# Patient Record
Sex: Female | Born: 1945
Health system: Southern US, Community
[De-identification: ages and names within clinical notes are randomized; demographics above are authoritative.]

## PROBLEM LIST (undated history)

## (undated) DIAGNOSIS — N184 Chronic kidney disease, stage 4 (severe): Secondary | ICD-10-CM

## (undated) DIAGNOSIS — N183 Chronic kidney disease, stage 3 (moderate): Secondary | ICD-10-CM

## (undated) DIAGNOSIS — Z789 Other specified health status: Secondary | ICD-10-CM

## (undated) DIAGNOSIS — I5022 Chronic systolic (congestive) heart failure: Secondary | ICD-10-CM

## (undated) DIAGNOSIS — I480 Paroxysmal atrial fibrillation: Secondary | ICD-10-CM

## (undated) DIAGNOSIS — R001 Bradycardia, unspecified: Secondary | ICD-10-CM

## (undated) DIAGNOSIS — I472 Ventricular tachycardia: Secondary | ICD-10-CM

## (undated) DIAGNOSIS — I10 Essential (primary) hypertension: Secondary | ICD-10-CM

## (undated) DIAGNOSIS — D649 Anemia, unspecified: Secondary | ICD-10-CM

## (undated) DIAGNOSIS — Z87891 Personal history of nicotine dependence: Secondary | ICD-10-CM

## (undated) DIAGNOSIS — I428 Other cardiomyopathies: Secondary | ICD-10-CM

## (undated) DIAGNOSIS — E785 Hyperlipidemia, unspecified: Secondary | ICD-10-CM

## (undated) DIAGNOSIS — E46 Unspecified protein-calorie malnutrition: Secondary | ICD-10-CM

## (undated) DIAGNOSIS — I340 Nonrheumatic mitral (valve) insufficiency: Secondary | ICD-10-CM

## (undated) HISTORY — DX: Ventricular tachycardia: I47.2

## (undated) HISTORY — DX: Chronic kidney disease, stage 3 (moderate): N18.3

## (undated) HISTORY — DX: Bradycardia, unspecified: R00.1

---

## 1998-10-01 ENCOUNTER — Other Ambulatory Visit: Admission: RE | Admit: 1998-10-01 | Discharge: 1998-10-01 | Payer: Self-pay | Admitting: Obstetrics and Gynecology

## 1998-11-04 ENCOUNTER — Other Ambulatory Visit: Admission: RE | Admit: 1998-11-04 | Discharge: 1998-11-04 | Payer: Self-pay | Admitting: Obstetrics and Gynecology

## 1999-07-08 ENCOUNTER — Other Ambulatory Visit: Admission: RE | Admit: 1999-07-08 | Discharge: 1999-07-08 | Payer: Self-pay | Admitting: Obstetrics and Gynecology

## 1999-11-09 ENCOUNTER — Other Ambulatory Visit: Admission: RE | Admit: 1999-11-09 | Discharge: 1999-11-09 | Payer: Self-pay | Admitting: Obstetrics and Gynecology

## 2000-03-10 ENCOUNTER — Other Ambulatory Visit: Admission: RE | Admit: 2000-03-10 | Discharge: 2000-03-10 | Payer: Self-pay | Admitting: Obstetrics and Gynecology

## 2001-11-16 ENCOUNTER — Inpatient Hospital Stay (HOSPITAL_COMMUNITY): Admission: EM | Admit: 2001-11-16 | Discharge: 2001-11-19 | Payer: Self-pay | Admitting: Emergency Medicine

## 2001-11-16 ENCOUNTER — Encounter: Payer: Self-pay | Admitting: Emergency Medicine

## 2001-11-17 ENCOUNTER — Encounter: Payer: Self-pay | Admitting: Internal Medicine

## 2001-11-18 ENCOUNTER — Encounter: Payer: Self-pay | Admitting: Internal Medicine

## 2008-09-30 ENCOUNTER — Emergency Department (HOSPITAL_COMMUNITY): Admission: EM | Admit: 2008-09-30 | Discharge: 2008-09-30 | Payer: Self-pay | Admitting: Emergency Medicine

## 2009-12-24 ENCOUNTER — Ambulatory Visit (HOSPITAL_COMMUNITY): Admission: RE | Admit: 2009-12-24 | Discharge: 2009-12-24 | Payer: Self-pay | Admitting: Gastroenterology

## 2010-11-12 NOTE — Discharge Summary (Signed)
[facility]  Patient:    Stephanie Yates, Stephanie Yates Visit Number: [id number] MRN: [id number]          Service Type: MED Location: 3W [phone] Attending Physician:  Caleen Essex Dictated by:   Henrine Screws, M.D. Admit Date:  11/16/2001 Discharge Date: 11/19/2001   CC:         Anthoney Harada, M.D.  [facility]  Court Joy, M.D.   Discharge Summary  DISCHARGE DIAGNOSES: 1. Congestive heart failure secondary to probable alcohol cardiomyopathy and    medication noncompliance, improved. 2. Hypertension, medication noncompliance. 3. Single episode of ventricular tachycardia, asymptomatic.  DISCHARGE MEDICATIONS: 1. Lasix 40 mg daily. 2. Lisinopril 20 mg daily. 3. Lopressor 25 mg b.i.d. 4. Aspirin 325 mg daily.  PROCEDURES:  2-D echocardiogram performed on Nov 16, 2001.  Findings:  1. Left ventricle mildly dilated, systolic function moderately to markedly decreased with an ejection fraction of 25-35%, and wall thickness mildly increased. 2. Moderate mitral valve regurgitation with mild calcification of the valve. 3. Left atrium mildly to moderately dilated.  4. Peak right ventricular systolic pressure mildly to moderately decreased.  5. Mild to moderate tricuspid regurgitation.  HISTORY OF PRESENT ILLNESS:  Stephanie Yates is a pleasant 65 year old female who unfortunately drinks from three to six beers a day and also does not take her medications on a regular basis.  She has a history of hypertension and presented on Nov 16, 2001, with an elevated blood pressure of 181/102 and tests were consistent with pulmonary edema.  HOSPITAL COURSE:  The patient responded rapidly to intravenous diuretics in the form of Lasix.  Within 24 hours of admission, she was completely out of congestive heart failure symptomatically.  A 2-D echocardiogram was obtained as above.  The patient had the following problems that were addressed:  #1  CONGESTIVE HEART FAILURE:  Treated with intravenous Lasix.  A 2-D echocardiogram showed an EF of 25-35% as above felt to be likely secondary to alcoholic cardiomyopathy.  The patients chest x-ray on admission revealed cardiomegaly with prominent pulmonary edema.  This was on Nov 16, 2001.  With oxygen therapy and diuresis, as well as the institution of ACE inhibitor therapy, blood pressures were better controlled and the chest x-ray on Nov 17, 2001, showed marked improvement in pulmonary edema noted on Nov 16, 2001. X-ray on Nov 16, 2001, revealed resolution of a pleural effusion that was present on the prior two exams.  The patient was ambulate in the halls without chest pain or claudication and without being significantly short of breath.  #2 - HYPERTENSION:  On admission, the blood pressure was as above.  On discharge, her blood pressure was 126/69, BUN 22, and creatinine 0.9.  The EKG on admission revealed no evidence of left ventricular hypertrophy.  She will be discharged on the above medications.  #3 - ALCOHOL USE:  The patient was treated with Librium while hospitalized to ensure the prevention of alcoholic withdrawal.  She had no symptoms of withdrawal while hospitalized.  She will be discharged home on no medications to prevent withdrawal.  I has been recommended to her to be seen either an outpatient clinic or at the hospital if she develops any anxiety, nervousness, tremulousness, or shortness of breath.  It has also be recommended that she join Alcoholics Anonymous and I will have the social worker talk to her about this prior to discharge.  FOLLOW-UP:  She will be followed up at [facility] for her medical problems. I  will see her x 1 in my office in one week to make sure that she is compliant with medications and feeling well.  ADDENDUM:  The patient did have one episode of ventricular tachycardia at 17 beats which stayed symptomatic on Nov 18, 2001.  I did discuss this  with Stephanie Yates, M.D., who felt that if it was asymptomatic and only a onetime occurrence that it might be related to alcoholic myocardial toxicity. She has had no arrhythmias in the past 24 hours.  Electrolytes are normal and she had a normal magnesium at 2.0 on Nov 18, 2001.  DISCHARGE LABORATORY DATA:  Sodium 141, potassium 4.4, chloride 103, bicarbonate 30, BUN 29, creatinine 0.9, glucose 93, calcium 9.0.  CPKs were negative x 3 while admitted for myocardial injury.  The discharge CPK was 228. A CK-MB of 1.7 was drawn on Nov 19, 2001.  Other laboratories while admitted included a magnesium of 2.0 on Nov 18, 2001. The white count was 10,100 on Nov 18, 2001, with a hemoglobin of 12.7 and a platelet count of 277,000.  The patient did have liver functions performed on Nov 16, 2001, revealing an AST of 40, an ALT of 23, an alkaline phosphatase of 94, and a total bilirubin of 0.2.  The TSH was 1.53 on Nov 17, 2001.  ACTIVITY:  The patient will be discharged to perform light activity around the home for the next several days.  When she feels well, she can resume her normal activities. Dictated by:   Henrine Screws, M.D. Attending Physician:  Caleen Essex DD:  11/19/01 TD:  11/21/01 Job: [id number]

## 2010-11-12 NOTE — Discharge Summary (Signed)
Stephanie Yates  Patient:    Stephanie Yates, Stephanie Yates Visit Number: [id number] MRN: [id number]          Service Type: MED Location: 3W [phone] Attending Physician:  Stephanie Yates Dictated by:   Stephanie Yates, M.D. Admit Date:  11/16/2001 Discharge Date: 11/19/2001   CC:         Stephanie Yates, M.D.  Stephanie Yates, M.D.   Discharge Summary  DATE OF BIRTH:  2046/03/15  DISCHARGE DIAGNOSES: 1. Congestive heart failure secondary to systolic dysfunction and probable    mild diastolic dysfunction. 2. Chronic medication noncompliance. 3. Uncontrolled hypertension, improved. 4. Alcohol abuse. 5. Ventricular tachycardia, asymptomatic.  DISCHARGE MEDICATIONS: 1. Lisinopril 20 mg orally daily. 2. Lopressor 25 mg p.o. b.i.d. 3. Lasix 40 mg daily. 4. Aspirin 325 mg a day.  DISCHARGE DIET:  No added.  DISCHARGE ACTIVITY:  Light around home until follow-up with physician.  PROCEDURES:  2-D echocardiogram performed on Nov 16, 2001.  Left ventricular ejection fraction estimated between 25-35%, left ventricular wall thickness mildly increased, mild calcification of the mitral valve with moderate mitral valve regurgitation, and left atrium mildly to moderately dilated.  Overall impression of severe global hypokinesis present with some regional variations. Interpreted by Stephanie Yates, M.D.  DISCHARGE LABORATORY DATA:  White blood cell count 10,100, hemoglobin 12.7, and platelet count 277,000 on Nov 18, 2001.  PT 12.5 seconds and PTT 25 seconds on Nov 16, 2001.  Sodium 141, potassium 4.4, chloride 103, bicarbonate 30, glucose 103, BUN 22, creatinine 0.9, and calcium 9.0 on Nov 19, 2001.  AST 40, ALT 23, alkaline phosphatase 94, and total bilirubin 0.2 on Nov 16, 2001.  Magnesium 2.0 on Nov 18, 2001.  CPKs x 3 drawn on Nov 16, 2001, were 174 at 12 midnight, 124 at 8:43 a.m., and 144 at 4 p.m. The CPK-MBs were 4.1, 3.5, and 3.5, respectively.   Troponins slightly elevated at 0.17, 0.12, and 0.13, respectively.  The TSH was 1.5 on Nov 17, 2001.  HISTORY OF PRESENT ILLNESS:  Stephanie Yates is a pleasant 65 year old female who unfortunately has an apparent history of medication noncompliance.  She admits to drinking three to six beers daily.  She has been followed by Stephanie Yates, M.D., in the past, but has apparently been discharged from his practice and is currently an unassigned patient.  The patient had not been taking medications for approximately a month prior admission.  She apparently had been taking three medicines for hypertension prior to that time.  She had been complaining of about one week of dry cough, orthopnea, and dyspnea on exertion, but no chest pain.  She denies any urinary or abdominal symptoms as well.  She denied any symptoms consistent with angina, GI or urologic bleeding, syncope, or focal weakness.  HOSPITAL COURSE: #1 - CONGESTIVE HEART FAILURE:  She presented to the [facility] Emergency Room with fairly shortness of breath and was noted to be in pulmonary edema.  The initial portable chest x-ray taken at 7:50 a.m. on Nov 16, 2001, revealed cardiomegaly with prominent pulmonary edema.  She was treated with intravenous nitroglycerin, intravenous Lasix 80 mg x 2, and oxygen with resolution of shortness of breath and rapid clearing of pulmonary edema.  A repeat chest x-ray on Nov 17, 2001, revealed substantial interval improvement in pulmonary edema.  A 2-D echocardiogram was as above.  On admission, the patient was started on Altace 5 mg b.i.d., Lasix 60 mg IV q.12h., Cardizem  30 mg every six hours, and clonidine on a p.r.n. basis for systolic blood pressures higher than 165/95.  Aspirin was also started on admission.  The patient actually did very well during the first 24 hours of admission and was not short of breath at all on my initial assessment after taking over her care from  Stephanie Yates, M.D.  On the second day of admission, the Foley was discontinued and she was ambulated.  Cardizem was discontinued and Lopressor was started at 25 mg p.o. b.i.d.  Lasix was switched from IV to p.o. 40 mg orally daily.  She did well on the second day of admission and actually ambulated in her room.  She had no further symptoms of CHF while admitted.  #2 - VENTRICULAR TACHYCARDIA:  The patient experienced a short burst of asymptomatic ventricular tachycardia at 7 a.m. on Nov 18, 2001.  It was a 19-beat run.  She was asymptomatic completely with no shortness of breath or chest pain.  She was unaware of having had this occur.  Electrolytes were normal and a magnesium level was normal at 2.0.  The case was discussed informally with Stephanie Yates, M.D., who light of her recent alcohol use and uncontrolled hypertension and without symptoms, thought that this could easily be secondary to an alcoholic cardiomyopathy and that an outpatient ischemic work-up could likely ensue.  She was monitored for 24 more hours and no further episodes of ventricular tachycardia occurred.  #3 - HYPERTENSION:  The patients blood pressure was very well controlled the second day of admission with blood pressures ranging systolically from 123XX123 and diastolic blood pressures in the 60s and 70s.  Pulses were in the 60s and 70s as well.  #4 - ALCOHOL ABUSE:  The patient never exhibited signs of withdrawal.  She was treated with Librium 25 mg p.o. b.i.d. while hospitalized and this was discontinued at discharge.  The EKG on admission revealed sinus tachycardia with frequent PVCs.  By discharge, she had no arrhythmias within 24 hours prior to discharge.  She was in sinus rhythm throughout her hospital stay.  DISPOSITION:  The patient was to be discharged home.  FOLLOW-UP:  She was to follow up in one week in my office at [facility] and then further follow-up was to be  arranged at [facility] for chronic medical care. Dictated by:   Stephanie Yates, M.D. Attending Physician:  Stephanie Yates DD:  11/27/01 TD:  11/27/01 Job: [id number]

## 2012-07-02 ENCOUNTER — Other Ambulatory Visit (HOSPITAL_COMMUNITY)
Admission: RE | Admit: 2012-07-02 | Discharge: 2012-07-02 | Disposition: A | Discharge status: Home / Self Care | Payer: Medicare PPO | Source: Ambulatory Visit | Attending: Internal Medicine | Admitting: Internal Medicine

## 2012-07-02 ENCOUNTER — Other Ambulatory Visit: Payer: Self-pay | Admitting: Internal Medicine

## 2012-07-02 DIAGNOSIS — Z124 Encounter for screening for malignant neoplasm of cervix: Secondary | ICD-10-CM | POA: Insufficient documentation

## 2012-07-02 DIAGNOSIS — Z1151 Encounter for screening for human papillomavirus (HPV): Secondary | ICD-10-CM | POA: Insufficient documentation

## 2012-12-14 ENCOUNTER — Encounter (HOSPITAL_COMMUNITY): Payer: Self-pay | Admitting: Unknown Physician Specialty

## 2012-12-14 ENCOUNTER — Emergency Department (HOSPITAL_COMMUNITY): Payer: Medicare PPO

## 2012-12-14 ENCOUNTER — Inpatient Hospital Stay (HOSPITAL_COMMUNITY)
Admission: EM | Admit: 2012-12-14 | Discharge: 2012-12-20 | DRG: 224 | Disposition: A | Discharge status: Home / Self Care | Payer: Medicare PPO | Attending: Internal Medicine | Admitting: Internal Medicine

## 2012-12-14 DIAGNOSIS — Z87891 Personal history of nicotine dependence: Secondary | ICD-10-CM

## 2012-12-14 DIAGNOSIS — I498 Other specified cardiac arrhythmias: Secondary | ICD-10-CM | POA: Diagnosis present

## 2012-12-14 DIAGNOSIS — I5043 Acute on chronic combined systolic (congestive) and diastolic (congestive) heart failure: Secondary | ICD-10-CM

## 2012-12-14 DIAGNOSIS — I509 Heart failure, unspecified: Secondary | ICD-10-CM | POA: Diagnosis present

## 2012-12-14 DIAGNOSIS — I129 Hypertensive chronic kidney disease with stage 1 through stage 4 chronic kidney disease, or unspecified chronic kidney disease: Secondary | ICD-10-CM | POA: Diagnosis present

## 2012-12-14 DIAGNOSIS — Z7982 Long term (current) use of aspirin: Secondary | ICD-10-CM

## 2012-12-14 DIAGNOSIS — I472 Ventricular tachycardia, unspecified: Principal | ICD-10-CM | POA: Diagnosis present

## 2012-12-14 DIAGNOSIS — N183 Chronic kidney disease, stage 3 unspecified: Secondary | ICD-10-CM | POA: Diagnosis present

## 2012-12-14 DIAGNOSIS — Z95810 Presence of automatic (implantable) cardiac defibrillator: Secondary | ICD-10-CM

## 2012-12-14 DIAGNOSIS — F1011 Alcohol abuse, in remission: Secondary | ICD-10-CM

## 2012-12-14 DIAGNOSIS — I4729 Other ventricular tachycardia: Principal | ICD-10-CM | POA: Diagnosis present

## 2012-12-14 DIAGNOSIS — R001 Bradycardia, unspecified: Secondary | ICD-10-CM

## 2012-12-14 DIAGNOSIS — Z79899 Other long term (current) drug therapy: Secondary | ICD-10-CM

## 2012-12-14 DIAGNOSIS — E785 Hyperlipidemia, unspecified: Secondary | ICD-10-CM | POA: Diagnosis present

## 2012-12-14 DIAGNOSIS — R7401 Elevation of levels of liver transaminase levels: Secondary | ICD-10-CM

## 2012-12-14 DIAGNOSIS — E875 Hyperkalemia: Secondary | ICD-10-CM

## 2012-12-14 DIAGNOSIS — E876 Hypokalemia: Secondary | ICD-10-CM

## 2012-12-14 DIAGNOSIS — I428 Other cardiomyopathies: Secondary | ICD-10-CM

## 2012-12-14 DIAGNOSIS — I5023 Acute on chronic systolic (congestive) heart failure: Secondary | ICD-10-CM | POA: Diagnosis present

## 2012-12-14 DIAGNOSIS — N289 Disorder of kidney and ureter, unspecified: Secondary | ICD-10-CM

## 2012-12-14 DIAGNOSIS — N390 Urinary tract infection, site not specified: Secondary | ICD-10-CM

## 2012-12-14 DIAGNOSIS — I5022 Chronic systolic (congestive) heart failure: Secondary | ICD-10-CM

## 2012-12-14 DIAGNOSIS — I10 Essential (primary) hypertension: Secondary | ICD-10-CM

## 2012-12-14 HISTORY — DX: Ventricular tachycardia, unspecified: I47.20

## 2012-12-14 HISTORY — DX: Other cardiomyopathies: I42.8

## 2012-12-14 HISTORY — DX: Chronic systolic (congestive) heart failure: I50.22

## 2012-12-14 HISTORY — DX: Essential (primary) hypertension: I10

## 2012-12-14 HISTORY — DX: Hyperlipidemia, unspecified: E78.5

## 2012-12-14 HISTORY — DX: Personal history of nicotine dependence: Z87.891

## 2012-12-14 HISTORY — DX: Ventricular tachycardia: I47.2

## 2012-12-14 LAB — CBC WITH DIFFERENTIAL/PLATELET
Basophils Absolute: 0 10*3/uL (ref 0.0–0.1)
Basophils Relative: 0 % (ref 0–1)
Eosinophils Absolute: 0 10*3/uL (ref 0.0–0.7)
Eosinophils Relative: 0 % (ref 0–5)
MCH: 27.7 pg (ref 26.0–34.0)
MCV: 82 fL (ref 78.0–100.0)
Platelets: 235 10*3/uL (ref 150–400)
RDW: 14.4 % (ref 11.5–15.5)
WBC: 15.9 10*3/uL — ABNORMAL HIGH (ref 4.0–10.5)

## 2012-12-14 LAB — COMPREHENSIVE METABOLIC PANEL
ALT: 146 U/L — ABNORMAL HIGH (ref 0–35)
AST: 277 U/L — ABNORMAL HIGH (ref 0–37)
Calcium: 9.6 mg/dL (ref 8.4–10.5)
Sodium: 145 mEq/L (ref 135–145)
Total Protein: 7.3 g/dL (ref 6.0–8.3)

## 2012-12-14 LAB — POCT I-STAT TROPONIN I

## 2012-12-14 MED ORDER — AMIODARONE HCL IN DEXTROSE 360-4.14 MG/200ML-% IV SOLN
30.0000 mg/h | INTRAVENOUS | Status: DC
  Administered 2012-12-15 – 2012-12-16 (×5): 30 mg/h via INTRAVENOUS
  Filled 2012-12-14 (×9): qty 200

## 2012-12-14 MED ORDER — AMIODARONE IV BOLUS ONLY 150 MG/100ML: 150.0000 mg | Freq: Once | INTRAVENOUS | Status: DC

## 2012-12-14 MED ORDER — AMIODARONE HCL 150 MG/3ML IV SOLN
60.0000 mg/h | Freq: Once | INTRAVENOUS | Status: DC
  Filled 2012-12-14: qty 9

## 2012-12-14 MED ORDER — AMIODARONE HCL IN DEXTROSE 360-4.14 MG/200ML-% IV SOLN
60.0000 mg/h | INTRAVENOUS | Status: DC
  Administered 2012-12-14 – 2012-12-15 (×2): 60 mg/h via INTRAVENOUS
  Filled 2012-12-14 (×2): qty 200

## 2012-12-14 MED ORDER — AMIODARONE LOAD VIA INFUSION
150.0000 mg | Freq: Once | INTRAVENOUS | Status: DC
  Filled 2012-12-14: qty 83.34

## 2012-12-14 NOTE — ED Provider Notes (Signed)
History     CSN: [id number]  Arrival date & time 12/14/12  2028   First MD Initiated Contact with Patient 12/14/12 2036      No chief complaint on file.   (Consider location/radiation/quality/duration/timing/severity/associated sxs/prior treatment) Patient is a 67 y.o. female presenting with altered mental status. The history is provided by the patient and the EMS personnel.  Altered Mental Status Presenting symptoms: confusion   Severity:  Moderate Most recent episode:  Today Episode history:  Single Timing:  Constant Progression:  Resolved Chronicity:  New Context comment:  Pt was moving things around in her room when she had N/V and then became altered at home.  Associated symptoms: nausea and vomiting   Associated symptoms: no abdominal pain, no difficulty breathing and no fever     No past medical history on file.  No past surgical history on file.  No family history on file.  History  Substance Use Topics  . Smoking status: Not on file  . Smokeless tobacco: Not on file  . Alcohol Use: Not on file    OB History   No data available      Review of Systems  Constitutional: Negative for fever and fatigue.  Respiratory: Negative for shortness of breath.   Cardiovascular: Negative for chest pain.  Gastrointestinal: Positive for nausea and vomiting. Negative for abdominal pain.  Genitourinary: Negative for dysuria.  Psychiatric/Behavioral: Positive for confusion and altered mental status.  All other systems reviewed and are negative.    Allergies  Review of patient's allergies indicates not on file.  Home Medications  No current outpatient prescriptions on file.  There were no vitals taken for this visit.  Physical Exam  Nursing note and vitals reviewed. Constitutional: She is oriented to person, place, and time. She appears well-developed and well-nourished. No distress.  HENT:  Head: Normocephalic and atraumatic.  Mouth/Throat: Oropharynx is clear  and moist.  Eyes: EOM are normal. Pupils are equal, round, and reactive to light.  Neck: Normal range of motion. Neck supple.  Cardiovascular: Normal rate, regular rhythm and normal heart sounds.  Exam reveals no friction rub.   No murmur heard. Pulmonary/Chest: Effort normal and breath sounds normal. No respiratory distress. She has no wheezes. She has no rales.  Abdominal: Soft. There is no tenderness. There is no rebound and no guarding.  Musculoskeletal: Normal range of motion. She exhibits no edema and no tenderness.  Lymphadenopathy:    She has no cervical adenopathy.  Neurological: She is alert and oriented to person, place, and time.  Skin: Skin is warm and dry. No rash noted.  Psychiatric: She has a normal mood and affect. Her behavior is normal.    ED Course  Procedures (including critical care time)  Labs Reviewed  CBC WITH DIFFERENTIAL - Abnormal; Notable for the following:    WBC 15.9 (*)    Neutrophils Relative % 83 (*)    Neutro Abs 13.2 (*)    Lymphocytes Relative 11 (*)    All other components within normal limits  COMPREHENSIVE METABOLIC PANEL - Abnormal; Notable for the following:    Potassium 3.4 (*)    Glucose, Bld 128 (*)    BUN 30 (*)    Creatinine, Ser 1.74 (*)    AST 277 (*)    ALT 146 (*)    GFR calc non Af Amer 29 (*)    GFR calc Af Amer 33 (*)    All other components within normal limits  PRO B NATRIURETIC  PEPTIDE - Abnormal; Notable for the following:    Pro B Natriuretic peptide (BNP) 16202.0 (*)    All other components within normal limits  POCT I-STAT TROPONIN I - Abnormal; Notable for the following:    Troponin i, poc 0.35 (*)    All other components within normal limits   Dg Chest [facility] 1 View  12/14/2012   *RADIOLOGY REPORT*  Clinical Data: Chest pain and short of breath  PORTABLE CHEST - 1 VIEW  Comparison: None.  Findings: Exam is lordotic.  The heart is normal.  No effusion, infiltrate, orpneumothorax .  There is a fullness in the upper  mediastinum on the left.  Impression:  1. No acute cardiopulmonary process.  2.  Fullness within the left upper mediastinum likely represents a vascular shadow and may relate to lordotic view.  Consider follow- up chest radiograph.   Original Report Authenticated By: Suzy Bouchard, M.D.    Date: 12/14/2012  Rate: 74  Rhythm: sinus rhythm  QRS Axis: left  Intervals: normal  ST/T Wave abnormalities: nonspecific ST changes  Conduction Disutrbances:nonspecific intraventricular conduction delay  Narrative Interpretation:   Old EKG Reviewed: changes noted    1. Stephanie Coup       MDM  26:50 PM 67 year old female with history of CHF, hypertension and paroxysmal ventricular tachycardia presenting via EMS with altered mental status and unstable ventricular tachycardia. She underwent synchronous cardioversion with 100 and x1 within the improvement in her mental status. On arrival she is awake alert, interactive and following commands. Blood pressure stable. Heart rate in the 80s. We'll check labs, troponin, BNP, chest x-ray both amiodarone and start an amiodarone drip.  11:05 PM tn mildly elevated, likely demand from v tach. bnp elevated. She has remote h/o v tach from notes in 2003 that sounds transient and not associated with symptoms. Pt has remained stable here on amio. Discussed with cardiology and pt admitted to CCU for further management.       Bonnetta Barry, MD 12/14/12 [id number]

## 2012-12-14 NOTE — ED Notes (Addendum)
Patient arrived via [facility]. Patient had been vomiting and called her family which stated patient was not acting right on the phone. Family arrived at patient's house to find her in and out of consciousness and called EMS. Upon arrival EMS was able to palpate BP in the 70's, monitored showed VT rate of 223 patient was cardioverted x1. IV established enroute. No medications given during transport.

## 2012-12-14 NOTE — ED Notes (Signed)
Family at bedside. 

## 2012-12-14 NOTE — ED Provider Notes (Signed)
I saw and evaluated the patient, reviewed the resident's note and I agree with the findings and plan.  Pt with vtach pta and now in sinus rhythm ( ems ecg reviewed)--pt with h/o same, will start on amiodarone and admit to cards  Leota Jacobsen, MD 12/14/12 2111

## 2012-12-15 DIAGNOSIS — I472 Ventricular tachycardia: Principal | ICD-10-CM

## 2012-12-15 DIAGNOSIS — I10 Essential (primary) hypertension: Secondary | ICD-10-CM

## 2012-12-15 DIAGNOSIS — I5022 Chronic systolic (congestive) heart failure: Secondary | ICD-10-CM

## 2012-12-15 DIAGNOSIS — R4182 Altered mental status, unspecified: Secondary | ICD-10-CM

## 2012-12-15 LAB — TROPONIN I: Troponin I: 1.13 ng/mL (ref <0.30)

## 2012-12-15 LAB — CREATININE, SERUM: Creatinine, Ser: 1.63 mg/dL — ABNORMAL HIGH (ref 0.50–1.10)

## 2012-12-15 LAB — TSH: TSH: 1.141 u[IU]/mL (ref 0.350–4.500)

## 2012-12-15 LAB — URINE MICROSCOPIC-ADD ON

## 2012-12-15 LAB — MAGNESIUM: Magnesium: 2 mg/dL (ref 1.5–2.5)

## 2012-12-15 LAB — BASIC METABOLIC PANEL
BUN: 30 mg/dL — ABNORMAL HIGH (ref 6–23)
CO2: 25 mEq/L (ref 19–32)
Chloride: 104 mEq/L (ref 96–112)
Creatinine, Ser: 1.52 mg/dL — ABNORMAL HIGH (ref 0.50–1.10)

## 2012-12-15 LAB — CBC
MCH: 27.4 pg (ref 26.0–34.0)
Platelets: 251 10*3/uL (ref 150–400)
RBC: 4.6 MIL/uL (ref 3.87–5.11)
RDW: 14.3 % (ref 11.5–15.5)
WBC: 15.1 10*3/uL — ABNORMAL HIGH (ref 4.0–10.5)

## 2012-12-15 LAB — URINALYSIS, ROUTINE W REFLEX MICROSCOPIC
Ketones, ur: NEGATIVE mg/dL
Nitrite: NEGATIVE
Urobilinogen, UA: 1 mg/dL (ref 0.0–1.0)

## 2012-12-15 LAB — PROTIME-INR: Prothrombin Time: 14.6 seconds (ref 11.6–15.2)

## 2012-12-15 MED ORDER — HEPARIN (PORCINE) IN NACL 100-0.45 UNIT/ML-% IJ SOLN
900.0000 [IU]/h | INTRAMUSCULAR | Status: DC
  Administered 2012-12-15: 900 [IU]/h via INTRAVENOUS
  Filled 2012-12-15: qty 250

## 2012-12-15 MED ORDER — SODIUM CHLORIDE 0.9 % IV SOLN: 250.0000 mL | INTRAVENOUS | Status: DC | PRN

## 2012-12-15 MED ORDER — PANTOPRAZOLE SODIUM 40 MG PO TBEC
40.0000 mg | DELAYED_RELEASE_TABLET | Freq: Every day | ORAL | Status: DC
  Administered 2012-12-15 – 2012-12-20 (×6): 40 mg via ORAL
  Filled 2012-12-15 (×6): qty 1

## 2012-12-15 MED ORDER — SODIUM CHLORIDE 0.9 % IJ SOLN
3.0000 mL | Freq: Two times a day (BID) | INTRAMUSCULAR | Status: DC
  Administered 2012-12-15 – 2012-12-20 (×7): 3 mL via INTRAVENOUS

## 2012-12-15 MED ORDER — FUROSEMIDE 40 MG PO TABS
40.0000 mg | ORAL_TABLET | Freq: Every day | ORAL | Status: DC
  Administered 2012-12-16 – 2012-12-20 (×5): 40 mg via ORAL
  Filled 2012-12-15 (×6): qty 1

## 2012-12-15 MED ORDER — SIMVASTATIN 20 MG PO TABS
20.0000 mg | ORAL_TABLET | Freq: Every evening | ORAL | Status: DC
  Administered 2012-12-15 – 2012-12-20 (×6): 20 mg via ORAL
  Filled 2012-12-15 (×6): qty 1

## 2012-12-15 MED ORDER — LISINOPRIL 20 MG PO TABS
20.0000 mg | ORAL_TABLET | Freq: Every day | ORAL | Status: DC
  Filled 2012-12-15: qty 1

## 2012-12-15 MED ORDER — POTASSIUM CHLORIDE CRYS ER 10 MEQ PO TBCR
30.0000 meq | EXTENDED_RELEASE_TABLET | Freq: Once | ORAL | Status: AC
  Administered 2012-12-15: 30 meq via ORAL
  Filled 2012-12-15: qty 1

## 2012-12-15 MED ORDER — HEPARIN BOLUS VIA INFUSION
1000.0000 [IU] | Freq: Once | INTRAVENOUS | Status: AC
  Administered 2012-12-15: 1000 [IU] via INTRAVENOUS
  Filled 2012-12-15: qty 1000

## 2012-12-15 MED ORDER — FUROSEMIDE 10 MG/ML IJ SOLN
40.0000 mg | Freq: Once | INTRAMUSCULAR | Status: AC
  Administered 2012-12-15: 40 mg via INTRAVENOUS

## 2012-12-15 MED ORDER — CARVEDILOL 6.25 MG PO TABS
6.2500 mg | ORAL_TABLET | Freq: Two times a day (BID) | ORAL | Status: DC
  Administered 2012-12-15 – 2012-12-19 (×8): 6.25 mg via ORAL
  Filled 2012-12-15 (×10): qty 1

## 2012-12-15 MED ORDER — SODIUM CHLORIDE 0.9 % IJ SOLN
3.0000 mL | INTRAMUSCULAR | Status: DC | PRN
  Administered 2012-12-15: 3 mL via INTRAVENOUS

## 2012-12-15 MED ORDER — ONDANSETRON HCL 4 MG/2ML IJ SOLN
4.0000 mg | Freq: Four times a day (QID) | INTRAMUSCULAR | Status: DC | PRN
  Filled 2012-12-15: qty 2

## 2012-12-15 MED ORDER — SPIRONOLACTONE 12.5 MG HALF TABLET
12.5000 mg | ORAL_TABLET | Freq: Every day | ORAL | Status: DC
  Administered 2012-12-15 – 2012-12-20 (×6): 12.5 mg via ORAL
  Filled 2012-12-15 (×6): qty 1

## 2012-12-15 MED ORDER — ASPIRIN 81 MG PO CHEW
81.0000 mg | CHEWABLE_TABLET | Freq: Every day | ORAL | Status: DC
  Administered 2012-12-15 – 2012-12-20 (×5): 81 mg via ORAL
  Filled 2012-12-15 (×5): qty 1

## 2012-12-15 MED ORDER — ACETAMINOPHEN 325 MG PO TABS
650.0000 mg | ORAL_TABLET | ORAL | Status: DC | PRN
  Filled 2012-12-15: qty 2

## 2012-12-15 MED ORDER — HEPARIN SODIUM (PORCINE) 5000 UNIT/ML IJ SOLN
5000.0000 [IU] | Freq: Three times a day (TID) | INTRAMUSCULAR | Status: DC
  Filled 2012-12-15 (×4): qty 1

## 2012-12-15 MED ORDER — METOPROLOL TARTRATE 25 MG PO TABS
25.0000 mg | ORAL_TABLET | Freq: Two times a day (BID) | ORAL | Status: DC
  Filled 2012-12-15 (×2): qty 1

## 2012-12-15 MED ORDER — HEPARIN BOLUS VIA INFUSION
2000.0000 [IU] | Freq: Once | INTRAVENOUS | Status: AC
  Administered 2012-12-15: 2000 [IU] via INTRAVENOUS
  Filled 2012-12-15: qty 2000

## 2012-12-15 MED ORDER — POTASSIUM CHLORIDE CRYS ER 20 MEQ PO TBCR
40.0000 meq | EXTENDED_RELEASE_TABLET | Freq: Once | ORAL | Status: AC
  Administered 2012-12-15: 40 meq via ORAL
  Filled 2012-12-15: qty 2

## 2012-12-15 MED ORDER — HEPARIN (PORCINE) IN NACL 100-0.45 UNIT/ML-% IJ SOLN
1050.0000 [IU]/h | INTRAMUSCULAR | Status: DC
  Administered 2012-12-15 – 2012-12-16 (×3): 1050 [IU]/h via INTRAVENOUS
  Filled 2012-12-15 (×2): qty 250

## 2012-12-15 MED ORDER — FUROSEMIDE 10 MG/ML IJ SOLN
40.0000 mg | Freq: Once | INTRAMUSCULAR | Status: AC
  Administered 2012-12-15: 40 mg via INTRAVENOUS
  Filled 2012-12-15: qty 4

## 2012-12-15 NOTE — Progress Notes (Signed)
ANTICOAGULATION CONSULT NOTE - Initial Consult  Pharmacy Consult for heparin Indication: elevated troponin in setting of VT  Allergies  Allergen Reactions  . Strawberry Hives and Itching    Patient Measurements: Height: 5\' 4"  (162.6 cm) Weight: 186 lb 8.2 oz (84.6 kg) IBW/kg (Calculated) : 54.7 Heparin dosing weight: 75kg  Vital Signs: Temp: 98 F (36.7 C) (06/21 0400) Temp src: Oral (06/21 0400) BP: 117/97 mmHg (06/21 0500) Pulse Rate: 57 (06/21 0500)  Labs:  Recent Labs  12/14/12 2058 12/15/12 0021  HGB 13.1 12.6  HCT 38.8 37.2  PLT 235 251  LABPROT  --  14.6  INR  --  1.16  CREATININE 1.74* 1.63*  TROPONINI  --  1.13*    Estimated Creatinine Clearance: 34.3 ml/min (by C-G formula based on Cr of 1.63).   Medical History: Past Medical History  Diagnosis Date  . CHF (congestive heart failure)   . Hypertension   . Hyperlipemia     Medications:  Prescriptions prior to admission  Medication Sig Dispense Refill  . aspirin 81 MG chewable tablet Chew 81 mg by mouth daily.      . furosemide (LASIX) 40 MG tablet Take 40 mg by mouth daily.      Marland Kitchen lisinopril (PRINIVIL,ZESTRIL) 20 MG tablet Take 20 mg by mouth daily.      . metoprolol tartrate (LOPRESSOR) 25 MG tablet Take 25 mg by mouth 2 (two) times daily.      . simvastatin (ZOCOR) 20 MG tablet Take 20 mg by mouth every evening.       Scheduled:  . amiodarone  150 mg Intravenous Once  . aspirin  81 mg Oral Daily  . furosemide  40 mg Oral Daily  . heparin  5,000 Units Subcutaneous Q8H  . lisinopril  20 mg Oral Daily  . metoprolol tartrate  25 mg Oral BID  . pantoprazole  40 mg Oral Daily  . simvastatin  20 mg Oral QPM  . sodium chloride  3 mL Intravenous Q12H   Infusions:  . amiodarone (NEXTERONE PREMIX) 360 mg/200 mL dextrose 30 mg/hr (12/15/12 0245)    Assessment: 67yo female had VT in presence of EMS, appropriately defibrillated, now w/ elevated troponin, being evaluated for ischemia, initially w/  no plan for anticoag given no CP, now w/ further elevation, to begin heparin.  Goal of Therapy:  Heparin level 0.3-0.7 units/ml Monitor platelets by anticoagulation protocol: Yes   Plan:  Will give small bolus of 2000 units x1 followed by gtt at 900 units/hr and monitor heparin levels and CBC.  Wynona Neat, PharmD, BCPS  12/15/2012,5:30 AM

## 2012-12-15 NOTE — H&P (Addendum)
Stephanie Yates is an 67 y.o. female.   Chief Complaint: altered mental status, VT diagnosed HPI: 67 yo woman with remote (2003) history of presumed NSVT and congestive heart failure who has remote tobacco use and has been having some mild epigastric discomfort over the last 1-2 days who had some potentially associated SOB and ultimately altered mental status leading family to call EMS. On arrival, patient intermittently with it and once in the EMS truck, VT diagnosed and appropriately defibrillated at 100J with success. She remembers it hurting "really bad." Once arrived at [facility], no further issues, IV amiodarone 150 mg x1 given and gtt initiated. 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 Per patient and her family she is not limited and can walk multiple blocks and flights of stairs without difficulty. She's had no recent chest pain outside of the burning epigastric discomfort over the last few days. No previous lightheadedness or pre-syncope until tonight. One of her relatives stated that she doesn't like to use her air conditioning and queried if dehydration could be at play. Otherwise, no complaints.   Past Medical History  Diagnosis Date  . CHF (congestive heart failure)   . Hypertension   . Hyperlipemia     History reviewed. No pertinent past surgical history. Remote tobacco; some heart disease in her family but no overt heart failure   History reviewed. No pertinent family history. Social History:  reports that she has quit smoking. She does not have any smokeless tobacco history on file. She reports that she does not drink alcohol or use illicit drugs.  Allergies:  Allergies  Allergen Reactions  . Strawberry Hives and Itching    Medications Prior to Admission  Medication Sig Dispense Refill  . aspirin 81 MG chewable tablet Chew 81 mg by mouth  daily.      . furosemide (LASIX) 40 MG tablet Take 40 mg by mouth daily.      Marland Kitchen lisinopril (PRINIVIL,ZESTRIL) 20 MG tablet Take 20 mg by mouth daily.      . metoprolol tartrate (LOPRESSOR) 25 MG tablet Take 25 mg by mouth 2 (two) times daily.      . simvastatin (ZOCOR) 20 MG tablet Take 20 mg by mouth every evening.        Results for orders placed during the hospital encounter of 12/14/12 (from the past 48 hour(s))  CBC WITH DIFFERENTIAL     Status: Abnormal   Collection Time    12/14/12  8:58 PM      Result Value Range   WBC 15.9 (*) 4.0 - 10.5 K/uL   RBC 4.73  3.87 - 5.11 MIL/uL   Hemoglobin 13.1  12.0 - 15.0 g/dL   HCT 38.8  36.0 - 46.0 %   MCV 82.0  78.0 - 100.0 fL   MCH 27.7  26.0 - 34.0 pg   MCHC 33.8  30.0 - 36.0 g/dL   RDW 14.4  11.5 - 15.5 %   Platelets 235  150 - 400 K/uL   Neutrophils Relative % 83 (*) 43 - 77 %   Neutro Abs 13.2 (*) 1.7 - 7.7 K/uL   Lymphocytes Relative 11 (*) 12 - 46 %   Lymphs Abs 1.7  0.7 - 4.0 K/uL   Monocytes Relative 7  3 - 12 %   Monocytes Absolute 1.0  0.1 - 1.0 K/uL  Eosinophils Relative 0  0 - 5 %   Eosinophils Absolute 0.0  0.0 - 0.7 K/uL   Basophils Relative 0  0 - 1 %   Basophils Absolute 0.0  0.0 - 0.1 K/uL  COMPREHENSIVE METABOLIC PANEL     Status: Abnormal   Collection Time    12/14/12  8:58 PM      Result Value Range   Sodium 145  135 - 145 mEq/L   Potassium 3.4 (*) 3.5 - 5.1 mEq/L   Chloride 106  96 - 112 mEq/L   CO2 23  19 - 32 mEq/L   Glucose, Bld 128 (*) 70 - 99 mg/dL   BUN 30 (*) 6 - 23 mg/dL   Creatinine, Ser 1.74 (*) 0.50 - 1.10 mg/dL   Calcium 9.6  8.4 - 10.5 mg/dL   Total Protein 7.3  6.0 - 8.3 g/dL   Albumin 3.7  3.5 - 5.2 g/dL   AST 277 (*) 0 - 37 U/L   ALT 146 (*) 0 - 35 U/L   Alkaline Phosphatase 97  39 - 117 U/L   Total Bilirubin 0.7  0.3 - 1.2 mg/dL   GFR calc non Af Amer 29 (*) >90 mL/min   GFR calc Af Amer 33 (*) >90 mL/min   Comment:            The eGFR has been calculated     using the CKD EPI  equation.     This calculation has not been     validated in all clinical     situations.     eGFR's persistently     <90 mL/min signify     possible Chronic Kidney Disease.  PRO B NATRIURETIC PEPTIDE     Status: Abnormal   Collection Time    12/14/12  8:58 PM      Result Value Range   Pro B Natriuretic peptide (BNP) 16202.0 (*) 0 - 125 pg/mL  POCT I-STAT TROPONIN I     Status: Abnormal   Collection Time    12/14/12  9:25 PM      Result Value Range   Troponin i, poc 0.35 (*) 0.00 - 0.08 ng/mL   Comment NOTIFIED PHYSICIAN     Comment 3            Comment: Due to the release kinetics of cTnI,     a negative result within the first hours     of the onset of symptoms does not rule out     myocardial infarction with certainty.     If myocardial infarction is still suspected,     repeat the test at appropriate intervals.   Dg Chest Port 1 View  12/14/2012   *RADIOLOGY REPORT*  Clinical Data: Chest pain and short of breath  PORTABLE CHEST - 1 VIEW  Comparison: None.  Findings: Exam is lordotic.  The heart is normal.  No effusion, infiltrate, orpneumothorax .  There is a fullness in the upper mediastinum on the left.  Impression:  1. No acute cardiopulmonary process.  2.  Fullness within the left upper mediastinum likely represents a vascular shadow and may relate to lordotic view.  Consider follow- up chest radiograph.   Original Report Authenticated By: Suzy Bouchard, M.D.    Review of Systems  Constitutional: Negative for fever, chills and weight loss.  HENT: Negative for hearing loss, neck pain and tinnitus.   Eyes: Negative for double vision, photophobia and pain.  Respiratory: Positive for shortness of  breath. Negative for cough, hemoptysis and sputum production.   Cardiovascular: Positive for chest pain. Negative for palpitations, orthopnea and claudication.  Gastrointestinal: Positive for heartburn, nausea and vomiting. Negative for abdominal pain and diarrhea.  Genitourinary:  Negative for dysuria, urgency and frequency.  Musculoskeletal: Negative for myalgias.  Skin: Negative for itching and rash.  Neurological: Positive for dizziness. Negative for tingling, tremors, sensory change and headaches.  Endo/Heme/Allergies: Negative for polydipsia.  Psychiatric/Behavioral: Negative for depression, suicidal ideas and substance abuse.    Blood pressure 110/83, pulse 55, temperature 97.6 F (36.4 C), temperature source Oral, resp. rate 20, height 5\' 4"  (1.626 m), weight 84.6 kg (186 lb 8.2 oz), SpO2 98.00%. Physical Exam  Nursing note and vitals reviewed. Constitutional: She is oriented to person, place, and time. She appears well-developed and well-nourished. No distress.  HENT:  Head: Normocephalic and atraumatic.  Nose: Nose normal.  Mouth/Throat: Oropharynx is clear and moist. No oropharyngeal exudate.  Eyes: Conjunctivae and EOM are normal. Pupils are equal, round, and reactive to light. No scleral icterus.  Neck: Normal range of motion. Neck supple. JVD present. No tracheal deviation present. No thyromegaly present.  1-2 cm above clavicle at 90 degrees  Cardiovascular: Normal rate, regular rhythm, normal heart sounds and intact distal pulses.  Exam reveals no gallop.   No murmur heard. Respiratory: Effort normal and breath sounds normal. No respiratory distress. She has no wheezes. She has no rales.  GI: Soft. Bowel sounds are normal. She exhibits no distension. There is no tenderness.  Musculoskeletal: Normal range of motion. She exhibits no edema and no tenderness.  Neurological: She is alert and oriented to person, place, and time. A cranial nerve deficit is present.  Skin: Skin is warm and dry. No rash noted. She is not diaphoretic. No erythema.  Psychiatric: She has a normal mood and affect. Her behavior is normal. Judgment and thought content normal.   Labs reviewed; wbc 15.9, h/h 13/39, plt 235, na 145, K 3.4, bun/cr 30/1.74, calcium 9.6 Albumin 3.7,  total protein 7.3, ast/alt 277/146, NTproBNP 16,202, Troponin 0.35 ECG from ambulance noted and confirmed VT; ECG on arrival inferior infarct (? Old), St depressions in II, III, aVF, V5, V6 Echo report viewed from 6/3: EF 25-35%, mild LVH, moderate MR, dilated LA Problem List VT AMS Systolic heart failure Elevated AST/ALT Hypertension Prior chart diagnosis of VT LV Dysfunction Elevated BNP 16,202 Elevated troponin Prior tobacco [place] Prior chart diagnosis of MR Stage III/IV CKD Leukocytosis Assessment/Plan 67 yo woman with PMH of hypertension, tobacco use (remotely), alcohol use (remote heavy), known systolic heart failure on statin, asa, ace-/bb here after having AMS thought 2/2 VT appropriately defibrillated. Etiology of VT is ischemic vs. Scar-mediated (heart failure) vs. Channelopathy. Will replete potassium. I favor diagnosis of long-standing heart failure leading to dilated cardiomyopathy and worsening scar burden. Her lack of air-conditioning could be a trigger. Will plan to evaluate for ischemia (trend troponins), continue IV amiodarone load for now (LFTs seen and elevated) but may not be a long-term strategy, continue beta-blocker. Typical cardiac work-up for ischemia and heart failure and add on echocardiogram in AM. She's already on ace-/bb and will uptitrate as able and arrange for close follow-up with cardiology at time of discharge. No heparin at this time as she is chest pain free; if enzymes trend upward, then will reconsider ischemia as etiology and initiate heparin.  - continue current dose of metoprolol and lisinopril 20 mg; will need to consolidate into toprol XL vs. Initiating carvedilol  if able - will keep on standing lasix 40 mg once daily - amiodarone gtt for 24 hours; then consider other options; LFTS ordered, will need PFTs, will add on TSH/fr T4 - echocardiogram in AM - update lipid panel, tsh - evaluate PCP records; may warranted cirrhosis evaluate with liver US -  optimize bp and other risk factors for CKD - obtain urinalysis  Jamaine Quintin 12/15/2012, 12:36 AM  Addendum: troponin rose some more; I suspect this is likely related to her underlying cardiomyopathy with some CKD; however, no LHC in at least 11 years and maybe ever and she certainly could develop CAD. Heparin per protocol per pharmacy. Low dose bolus at most.

## 2012-12-15 NOTE — Progress Notes (Addendum)
   Patient seen and examined. Dr. Evette Georges H&P reviewed.  67 y/o with h/o remote ETOH/tobacco abuse (quit many years ago). Has EF 25-30% by ECHO in 2003. Has not followed with Cardiology. See PCP Dr. Inda Merlin. Very functional. Works in [employer].  Admitted with CP and monomorphic VT at 223 bpm. Defibrillated in field and started on amio. Now feels fine. No recurrent VT on tele - just occasional PVCs.  Trop 2.0. K 3.4 on admit. mag 2.0. BNP 16K  On exam. Mild fluid overload. No S3.   Suspect NICM VT with aborted SCD. Elevated trop likely due to VT/ICD shock.   Plan:  1) Continue amio  2) Keep K>= 4.0 mg>=2.0 3) Check echo 4) Cath Monday (watch renal function) - hold ACE-I for now 5) EP to see for ICD 6) Lasix x 1 now 7) Can go to tele later today if rhythm stable  Stephanie Banton,MD 8:12 AM  The patient is critically ill with multiple organ systems failure and requires high complexity decision making for assessment and support, frequent evaluation and titration of therapies, application of advanced monitoring technologies and extensive interpretation of multiple databases.   Critical Care Time devoted to patient care services described in this note is 35 Minutes.

## 2012-12-15 NOTE — Progress Notes (Signed)
ANTICOAGULATION CONSULT NOTE - Follow Up Consult  Pharmacy Consult for heparin Indication: elevated troponin in setting of VT  Labs:  Recent Labs  12/14/12 2058 12/15/12 0021 12/15/12 0540 12/15/12 1436 12/15/12 2243  HGB 13.1 12.6  --   --   --   HCT 38.8 37.2  --   --   --   PLT 235 251  --   --   --   LABPROT  --  14.6  --   --   --   INR  --  1.16  --   --   --   HEPARINUNFRC  --   --   --  0.25* 0.43  CREATININE 1.74* 1.63* 1.52*  --   --   TROPONINI  --  1.13* 2.02*  --   --      Assessment/Plan:  67yo female now therapeutic on heparin after rate increase.  Will continue gtt at current rate and confirm stable with am labs.  Wynona Neat, PharmD, BCPS  12/15/2012,11:18 PM

## 2012-12-15 NOTE — Progress Notes (Signed)
ANTICOAGULATION CONSULT NOTE - Initial Consult  Pharmacy Consult for heparin Indication: elevated troponin in setting of VT  Allergies  Allergen Reactions  . Strawberry Hives and Itching    Patient Measurements: Height: 5\' 4"  (162.6 cm) Weight: 189 lb 9.5 oz (86 kg) (Scale A) IBW/kg (Calculated) : 54.7 Heparin dosing weight: 75kg  Vital Signs: Temp: 98.1 F (36.7 C) (06/21 1100) Temp src: Oral (06/21 1100) BP: 138/96 mmHg (06/21 1100) Pulse Rate: 69 (06/21 1100)  Labs:  Recent Labs  12/14/12 2058 12/15/12 0021 12/15/12 0540 12/15/12 1436  HGB 13.1 12.6  --   --   HCT 38.8 37.2  --   --   PLT 235 251  --   --   LABPROT  --  14.6  --   --   INR  --  1.16  --   --   HEPARINUNFRC  --   --   --  0.25*  CREATININE 1.74* 1.63* 1.52*  --   TROPONINI  --  1.13* 2.02*  --     Estimated Creatinine Clearance: 37.1 ml/min (by C-G formula based on Cr of 1.52).   Assessment: 67yo female had VT in presence of EMS, appropriately defibrillated, now w/ elevated troponin, being evaluated for ischemia, initially w/ no plan for anticoag given no CP, now w/ further elevation, to begin heparin. Was given a low dose bolus of 2000 units and started on gtt at 900units/hr. First level check came back low at 0.25. No bleeding noted, no problems with infusion  Goal of Therapy:  Heparin level 0.3-0.7 units/ml Monitor platelets by anticoagulation protocol: Yes   Plan:  1. Heparin bolus of 1000units IV x1 2. Increase heparin gtt to 1050 units/hr 3. 6 hour HL 4. Daily HL and CBC  Stephanie Yates D. Stephanie Yates, PharmD Clinical Pharmacist Pager: [phone] 12/15/2012 3:21 PM

## 2012-12-16 ENCOUNTER — Inpatient Hospital Stay (HOSPITAL_COMMUNITY): Payer: Medicare PPO

## 2012-12-16 LAB — CBC
HCT: 35.8 % — ABNORMAL LOW (ref 36.0–46.0)
Hemoglobin: 11.8 g/dL — ABNORMAL LOW (ref 12.0–15.0)
MCH: 27.2 pg (ref 26.0–34.0)
MCHC: 33 g/dL (ref 30.0–36.0)

## 2012-12-16 LAB — HEPARIN LEVEL (UNFRACTIONATED): Heparin Unfractionated: 0.29 IU/mL — ABNORMAL LOW (ref 0.30–0.70)

## 2012-12-16 LAB — URINE CULTURE

## 2012-12-16 LAB — BASIC METABOLIC PANEL
BUN: 23 mg/dL (ref 6–23)
CO2: 28 mEq/L (ref 19–32)
Chloride: 103 mEq/L (ref 96–112)
Creatinine, Ser: 1.04 mg/dL (ref 0.50–1.10)

## 2012-12-16 MED ORDER — POTASSIUM CHLORIDE CRYS ER 20 MEQ PO TBCR
40.0000 meq | EXTENDED_RELEASE_TABLET | Freq: Two times a day (BID) | ORAL | Status: DC
  Administered 2012-12-16 – 2012-12-20 (×9): 40 meq via ORAL
  Filled 2012-12-16 (×11): qty 2

## 2012-12-16 MED ORDER — SODIUM CHLORIDE 0.9 % IV SOLN: 250.0000 mL | INTRAVENOUS | Status: DC | PRN

## 2012-12-16 MED ORDER — HEPARIN (PORCINE) IN NACL 100-0.45 UNIT/ML-% IJ SOLN
1250.0000 [IU]/h | INTRAMUSCULAR | Status: DC
  Administered 2012-12-16 – 2012-12-17 (×2): 1250 [IU]/h via INTRAVENOUS
  Filled 2012-12-16 (×2): qty 250

## 2012-12-16 MED ORDER — SODIUM CHLORIDE 0.9 % IJ SOLN: 3.0000 mL | INTRAMUSCULAR | Status: DC | PRN

## 2012-12-16 MED ORDER — SODIUM CHLORIDE 0.9 % IJ SOLN
3.0000 mL | Freq: Two times a day (BID) | INTRAMUSCULAR | Status: DC
  Administered 2012-12-17 – 2012-12-18 (×4): 3 mL via INTRAVENOUS

## 2012-12-16 MED ORDER — ASPIRIN 81 MG PO CHEW
324.0000 mg | CHEWABLE_TABLET | ORAL | Status: AC
  Administered 2012-12-17: 324 mg via ORAL
  Filled 2012-12-16: qty 4

## 2012-12-16 NOTE — Progress Notes (Addendum)
ANTICOAGULATION CONSULT NOTE - Initial Consult  Pharmacy Consult for heparin Indication: elevated troponin in setting of VT  Allergies  Allergen Reactions  . Strawberry Hives and Itching    Patient Measurements: Height: 5\' 4"  (162.6 cm) Weight: 188 lb (85.276 kg) (SCALE A) IBW/kg (Calculated) : 54.7 Heparin dosing weight: 75kg  Vital Signs: Temp: 98.1 F (36.7 C) (06/22 0502) Temp src: Oral (06/22 0502) BP: 153/77 mmHg (06/22 0502) Pulse Rate: 67 (06/22 0502)  Labs:  Recent Labs  12/14/12 2058 12/15/12 0021 12/15/12 0540 12/15/12 1436 12/15/12 2243 12/16/12 0440  HGB 13.1 12.6  --   --   --  11.8*  HCT 38.8 37.2  --   --   --  35.8*  PLT 235 251  --   --   --  228  LABPROT  --  14.6  --   --   --   --   INR  --  1.16  --   --   --   --   HEPARINUNFRC  --   --   --  0.25* 0.43 0.29*  CREATININE 1.74* 1.63* 1.52*  --   --  1.04  TROPONINI  --  1.13* 2.02*  --   --   --     Estimated Creatinine Clearance: 53.9 ml/min (by C-G formula based on Cr of 1.04).   Assessment: 67yo female had VT in presence of EMS, appropriately defibrillated, now w/ elevated troponin, being evaluated for ischemia, initially w/ no plan for anticoag given no CP, now w/ further elevation, to begin heparin. Level was therapeutic last evening on 1050units/hr, this morning confirmatory lab was slightly low at 0.29. Per RN not held and no issues with line. No bleeding noted.  Goal of Therapy:  Heparin level 0.3-0.7 units/ml Monitor platelets by anticoagulation protocol: Yes   Plan:  1. Increase heparin gtt to 1150 units/hr 2. 6 hour HL 3. Daily HL and CBC 4. F/u signs/symptoms bleeding  Lauren D. Bajbus, PharmD Clinical Pharmacist Pager: [phone] 12/16/2012 10:01 AM  Addendum:  Heparin level = 0.29 remains slightly subtherapeutic at 1600, no interruptions with infusion, no issues with line, no bleeding per RN. For cath tomorrow  Plan: - Increase heparin rate to 1250 untis/hr - f/u  AM heparin level

## 2012-12-16 NOTE — Progress Notes (Signed)
Patient complained of being SOB after getting back to bed from the bedside commode. O2 sats 97%, respirations 22 and appear labored. On call MD notified and new order for additional Lasix given.

## 2012-12-16 NOTE — ED Provider Notes (Signed)
I saw and evaluated the patient, reviewed the resident's note and I agree with the findings and plan.  Leota Jacobsen, MD 12/16/12 [phone]

## 2012-12-16 NOTE — Progress Notes (Signed)
Patient ID: Stephanie Yates, female   DOB: 1946/02/22, 67 y.o.   MRN: [id number]     67 y/o with h/o remote ETOH/tobacco abuse (quit many years ago). Has EF 25-30% by ECHO in 2003. Has not followed with Cardiology. Seen by Dr Martinique in 2012   See PCP Dr. Inda Merlin. Very functional. Works in [employer].  Admitted with CP and monomorphic VT at 223 bpm. Defibrillated in field and started on amio. Now feels fine. No recurrent VT on tele - just occasional PVCs.  Trop 2.0. K 3.4 on admit. mag 2.0. BNP 16K  Suspect NICM VT with aborted SCD. Elevated trop likely due to VT/ICD shock.      Subjective:  Denies SSCP, palpitations or Dyspnea  Objective:  Filed Vitals:   12/15/12 1430 12/15/12 2017 12/16/12 0010 12/16/12 0502  BP: 153/88 147/44 173/89 153/77  Pulse: 59 73 79 67  Temp: 97.9 F (36.6 C) 99.1 F (37.3 C)  98.1 F (36.7 C)  TempSrc: Oral Oral  Oral  Resp: 20 18 18 18   Height:      Weight:    188 lb (85.276 kg)  SpO2: 95% 94% 97% 99%    Intake/Output from previous day:  Intake/Output Summary (Last 24 hours) at 12/16/12 1007 Last data filed at 12/16/12 0500  Gross per 24 hour  Intake    120 ml  Output   2750 ml  Net  -2630 ml    Physical Exam: Affect appropriate Obese black female  HEENT: normal Neck supple with no adenopathy JVP normal no bruits no thyromegaly Lungs clear with no wheezing and good diaphragmatic motion Heart:  S1/S2 no murmur, no rub, gallop or click PMI normal Abdomen: benighn, BS positve, no tenderness, no AAA no bruit.  No HSM or HJR Distal pulses intact with no bruits No edema Neuro non-focal Skin warm and dry No muscular weakness '  Lab Results: Basic Metabolic Panel:  Recent Labs  12/14/12 2058 12/15/12 0021 12/15/12 0540 12/16/12 0440  NA 145  --  142 139  K 3.4*  --  3.7 3.3*  CL 106  --  104 103  CO2 23  --  25 28  GLUCOSE 128*  --  133* 103*  BUN 30*  --  30* 23  CREATININE 1.74* 1.63* 1.52* 1.04  CALCIUM 9.6  --  8.8  8.6  MG  --  2.0  --   --    Liver Function Tests:  Recent Labs  12/14/12 2058  AST 277*  ALT 146*  ALKPHOS 97  BILITOT 0.7  PROT 7.3  ALBUMIN 3.7   CBC:  Recent Labs  12/14/12 2058 12/15/12 0021 12/16/12 0440  WBC 15.9* 15.1* 14.5*  NEUTROABS 13.2*  --   --   HGB 13.1 12.6 11.8*  HCT 38.8 37.2 35.8*  MCV 82.0 80.9 82.5  PLT 235 251 228   Cardiac Enzymes:  Recent Labs  12/15/12 0021 12/15/12 0540  TROPONINI 1.13* 2.02*   Thyroid Function Tests:  Recent Labs  12/15/12 0021  TSH 1.141    Imaging: Dg Chest Port 1 View  12/16/2012   *RADIOLOGY REPORT*  Clinical Data: CHF and shortness of breath.  PORTABLE CHEST - 1 VIEW  Comparison: 12/14/2012  Findings: Mild pulmonary venous hypertensive changes present without evidence of overt pulmonary edema.  No pleural fluid is identified.  There is stable cardiomegaly.  IMPRESSION: Mild pulmonary venous hypertensive changes.   Original Report Authenticated By: Aletta Edouard, M.D.   Dg  Chest Port 1 View  12/14/2012   *RADIOLOGY REPORT*  Clinical Data: Chest pain and short of breath  PORTABLE CHEST - 1 VIEW  Comparison: None.  Findings: Exam is lordotic.  The heart is normal.  No effusion, infiltrate, orpneumothorax .  There is a fullness in the upper mediastinum on the left.  Impression:  1. No acute cardiopulmonary process.  2.  Fullness within the left upper mediastinum likely represents a vascular shadow and may relate to lordotic view.  Consider follow- up chest radiograph.   Original Report Authenticated By: Suzy Bouchard, M.D.    Cardiac Studies:  ECG: SR rate 74 normal QT 417 inferolateral T wave changes   Telemetry:   NSR rates 60-70 no further VT   Echo:   Medications:   . amiodarone  150 mg Intravenous Once  . aspirin  81 mg Oral Daily  . carvedilol  6.25 mg Oral BID WC  . furosemide  40 mg Oral Daily  . pantoprazole  40 mg Oral Daily  . simvastatin  20 mg Oral QPM  . sodium chloride  3 mL Intravenous  Q12H  . spironolactone  12.5 mg Oral Daily     . amiodarone (NEXTERONE PREMIX) 360 mg/200 mL dextrose 30 mg/hr (12/16/12 0120)  . heparin 1,150 Units/hr (12/16/12 1004)    Assessment/Plan:  VT:  STable on amiodarone drip per DB.  Will need EP consult post cath continue beta blocker DCM:  ? Nonischemic and ETOH  Troponin elevated for cath tomorrow Chol: continue statin K- suppl  Jenkins Rouge 12/16/2012, 10:07 AM

## 2012-12-17 ENCOUNTER — Encounter (HOSPITAL_COMMUNITY)
Admission: EM | Disposition: A | Discharge status: Home / Self Care | Payer: Self-pay | Source: Home / Self Care | Attending: Internal Medicine

## 2012-12-17 DIAGNOSIS — I369 Nonrheumatic tricuspid valve disorder, unspecified: Secondary | ICD-10-CM

## 2012-12-17 DIAGNOSIS — I509 Heart failure, unspecified: Secondary | ICD-10-CM

## 2012-12-17 HISTORY — PX: LEFT AND RIGHT HEART CATHETERIZATION WITH CORONARY ANGIOGRAM: SHX5449

## 2012-12-17 LAB — CREATININE, SERUM
Creatinine, Ser: 0.94 mg/dL (ref 0.50–1.10)
GFR calc Af Amer: 70 mL/min — ABNORMAL LOW (ref >90)

## 2012-12-17 LAB — BASIC METABOLIC PANEL
CO2: 26 mEq/L (ref 19–32)
Calcium: 8.5 mg/dL (ref 8.4–10.5)
Chloride: 106 mEq/L (ref 96–112)
GFR calc Af Amer: 59 mL/min — ABNORMAL LOW (ref >90)
Sodium: 138 mEq/L (ref 135–145)

## 2012-12-17 LAB — POCT ACTIVATED CLOTTING TIME: Activated Clotting Time: 140 seconds

## 2012-12-17 LAB — CBC
HCT: 34.7 % — ABNORMAL LOW (ref 36.0–46.0)
Platelets: 233 10*3/uL (ref 150–400)
RBC: 4.43 MIL/uL (ref 3.87–5.11)
RDW: 14.5 % (ref 11.5–15.5)
RDW: 14.1 % (ref 11.5–15.5)
WBC: 10.4 10*3/uL (ref 4.0–10.5)
WBC: 11.4 10*3/uL — ABNORMAL HIGH (ref 4.0–10.5)

## 2012-12-17 LAB — POCT I-STAT 3, VENOUS BLOOD GAS (G3P V)
Bicarbonate: 26.2 mEq/L — ABNORMAL HIGH (ref 20.0–24.0)
TCO2: 28 mmol/L (ref 0–100)
pCO2, Ven: 48.5 mmHg (ref 45.0–50.0)
pH, Ven: 7.342 — ABNORMAL HIGH (ref 7.250–7.300)
pO2, Ven: 34 mmHg (ref 30.0–45.0)

## 2012-12-17 LAB — POCT I-STAT 3, ART BLOOD GAS (G3+): Acid-Base Excess: 1 mmol/L (ref 0.0–2.0)

## 2012-12-17 LAB — HEPARIN LEVEL (UNFRACTIONATED): Heparin Unfractionated: 0.3 IU/mL (ref 0.30–0.70)

## 2012-12-17 SURGERY — LEFT AND RIGHT HEART CATHETERIZATION WITH CORONARY ANGIOGRAM
Anesthesia: LOCAL

## 2012-12-17 MED ORDER — MIDAZOLAM HCL 2 MG/2ML IJ SOLN
INTRAMUSCULAR | Status: AC
  Filled 2012-12-17: qty 2

## 2012-12-17 MED ORDER — SODIUM CHLORIDE 0.9 % IJ SOLN: 3.0000 mL | INTRAMUSCULAR | Status: DC | PRN

## 2012-12-17 MED ORDER — HEPARIN (PORCINE) IN NACL 100-0.45 UNIT/ML-% IJ SOLN
1300.0000 [IU]/h | INTRAMUSCULAR | Status: DC
  Administered 2012-12-17: 1300 [IU]/h via INTRAVENOUS
  Filled 2012-12-17 (×2): qty 250

## 2012-12-17 MED ORDER — LIDOCAINE HCL (PF) 1 % IJ SOLN
INTRAMUSCULAR | Status: AC
  Filled 2012-12-17: qty 30

## 2012-12-17 MED ORDER — SODIUM CHLORIDE 0.9 % IJ SOLN
3.0000 mL | Freq: Two times a day (BID) | INTRAMUSCULAR | Status: DC
  Administered 2012-12-18 (×3): 3 mL via INTRAVENOUS

## 2012-12-17 MED ORDER — HEPARIN SODIUM (PORCINE) 5000 UNIT/ML IJ SOLN
5000.0000 [IU] | Freq: Three times a day (TID) | INTRAMUSCULAR | Status: DC
  Administered 2012-12-18 (×3): 5000 [IU] via SUBCUTANEOUS
  Filled 2012-12-17 (×7): qty 1

## 2012-12-17 MED ORDER — FENTANYL CITRATE 0.05 MG/ML IJ SOLN
INTRAMUSCULAR | Status: AC
  Filled 2012-12-17: qty 2

## 2012-12-17 MED ORDER — SODIUM CHLORIDE 0.9 % IV SOLN: 250.0000 mL | INTRAVENOUS | Status: DC | PRN

## 2012-12-17 MED ORDER — NITROGLYCERIN 0.2 MG/ML ON CALL CATH LAB
INTRAVENOUS | Status: AC
  Filled 2012-12-17: qty 1

## 2012-12-17 MED ORDER — HEPARIN (PORCINE) IN NACL 2-0.9 UNIT/ML-% IJ SOLN
INTRAMUSCULAR | Status: AC
  Filled 2012-12-17: qty 1500

## 2012-12-17 NOTE — Interval H&P Note (Signed)
History and Physical Interval Note:  12/17/2012 5:34 PM  Stephanie Yates  has presented today for surgery, with the diagnosis of VT  The various methods of treatment have been discussed with the patient and family. After consideration of risks, benefits and other options for treatment, the patient has consented to  Procedure(s): LEFT AND RIGHT HEART CATHETERIZATION WITH CORONARY ANGIOGRAM (N/A) as a surgical intervention .  The patient's history has been reviewed, patient examined, no change in status, stable for surgery.  I have reviewed the patient's chart and labs.  Questions were answered to the patient's satisfaction.     Sherren Mocha

## 2012-12-17 NOTE — Care Management Note (Signed)
    Page 1 of 2   12/17/2012     10:40:43 AM   CARE MANAGEMENT NOTE 12/17/2012  Patient:  Stephanie Yates, Stephanie Yates   Account Number:  [id number]  Date Initiated:  12/17/2012  Documentation initiated by:  [facility]  Subjective/Objective Assessment:   67 y.o. female.  Chief Complaint: altered mental status, VT diagnosed     Action/Plan:   Amiodarone gtts, heart cath//HHRN   Anticipated DC Date:  12/19/2012   Anticipated DC Plan:  [place] referral  Clinical Social Worker      DC Forensic scientist  CM consult      [facility] Choice  HOME HEALTH   Choice offered to / List presented to:  C-1 Patient        [place] arranged  HH-1 RN  [place].   Status of service:  Completed, signed off Medicare Important Message given?   (If response is "NO", the following Medicare IM given date fields will be blank) Date Medicare IM given:   Date Additional Medicare IM given:    Discharge Disposition:    Per UR Regulation:    If discussed at Long Length of Stay Meetings, dates discussed:    Comments:  12/17/12 @1000 .Marland KitchenMarland KitchenFuller Mandril, RN, BSN, [employer]  [phone] Spoke with pt and grandkids at bedside concerning discharge planning.  [place] list of home health agencies. Pt chose [facility] to render services of RN.  Clover Mealy of [facility] notified.

## 2012-12-17 NOTE — Progress Notes (Signed)
Patient heart rate sustaining in 40's.  MD Wynonia Lawman made aware.  Amiodarone drip d/c'd per Wynonia Lawman.  Patient asleep in bed in no acute distress. RN will continue to monitor. Shellee Milo, RN

## 2012-12-17 NOTE — Progress Notes (Signed)
UR COMPLETED  

## 2012-12-17 NOTE — CV Procedure (Signed)
   Cardiac Catheterization Procedure Note  Name: Stephanie Yates MRN: [id number] DOB: December 02, 1945  Procedure: Right Heart Cath, Left Heart Cath, Selective Coronary Angiography, LV angiography  Indication: CHF< Ventricular tachycardia, elevated cardiac enzymes  Procedural Details: The right groin was prepped, draped, and anesthetized with 1% lidocaine. Using the modified Seldinger technique a 5 French sheath was placed in the right femoral artery and a 7 French sheath was placed in the right femoral vein. A Swan-Ganz catheter was used for the right heart catheterization. Standard protocol was followed for recording of right heart pressures and sampling of oxygen saturations. Fick cardiac output was calculated. Standard Judkins catheters were used for selective coronary angiography and left ventriculography. There were no immediate procedural complications. The patient was transferred to the post catheterization recovery area for further monitoring.  Procedural Findings: Hemodynamics RA 11 RV 43/15 PA 36/21 mean 29 PCWP 20 LV 146/32 AO 149/71 mean 100  Oxygen saturations: PA 60 AO 98  Cardiac Output (Fick) 4.4  Cardiac Index (Fick) 2.3   Coronary angiography: Coronary dominance: right  Left mainstem: Widely patent, no obstructive disease  Left anterior descending (LAD): Diffuse luminal irregularity with no significant stenosis. Reaches the LV apex  Left circumflex (LCx): Large caliber vessel with 2 large OM branches. No significant obstructive disease.  Right coronary artery (RCA): Dominant vessel, medium in caliber, no obstructive disease.  Left ventriculography: Severe global LV systolic dysfunction, LVEF 10-15%.  Final Conclusions:   1. Severe NICM 2. Patent coronary arteries without significant stenosis  Recommendations: EP consult for consideration of ICD considering her very severe LV dysfunction and VT at presentation.   Sherren Mocha 12/17/2012, 6:36 PM

## 2012-12-17 NOTE — Progress Notes (Signed)
  Echocardiogram 2D Echocardiogram has been performed.  Donata Clay 12/17/2012, 11:53 AM

## 2012-12-17 NOTE — H&P (View-Only) (Signed)
Patient ID: LENIYAH BETO, female   DOB: January 05, 1946, 67 y.o.   MRN: [id number]     67 y/o with h/o remote ETOH/tobacco abuse (quit many years ago). Has EF 25-30% by ECHO in 2003. Has not followed with Cardiology. Seen by Dr Martinique in 2012   See PCP Dr. Inda Merlin. Very functional. Works in [employer].  Admitted with CP and monomorphic VT at 223 bpm. Defibrillated in field and started on amio. Now feels fine. No recurrent VT on tele - just occasional PVCs.  Trop 2.0. K 3.4 on admit. mag 2.0. BNP 16K  Suspect NICM VT with aborted SCD. Elevated trop likely due to VT/ICD shock.      Subjective:  Denies SSCP, palpitations or Dyspnea  Objective:  Filed Vitals:   12/15/12 1430 12/15/12 2017 12/16/12 0010 12/16/12 0502  BP: 153/88 147/44 173/89 153/77  Pulse: 59 73 79 67  Temp: 97.9 F (36.6 C) 99.1 F (37.3 C)  98.1 F (36.7 C)  TempSrc: Oral Oral  Oral  Resp: 20 18 18 18   Height:      Weight:    188 lb (85.276 kg)  SpO2: 95% 94% 97% 99%    Intake/Output from previous day:  Intake/Output Summary (Last 24 hours) at 12/16/12 1007 Last data filed at 12/16/12 0500  Gross per 24 hour  Intake    120 ml  Output   2750 ml  Net  -2630 ml    Physical Exam: Affect appropriate Obese black female  HEENT: normal Neck supple with no adenopathy JVP normal no bruits no thyromegaly Lungs clear with no wheezing and good diaphragmatic motion Heart:  S1/S2 no murmur, no rub, gallop or click PMI normal Abdomen: benighn, BS positve, no tenderness, no AAA no bruit.  No HSM or HJR Distal pulses intact with no bruits No edema Neuro non-focal Skin warm and dry No muscular weakness '  Lab Results: Basic Metabolic Panel:  Recent Labs  12/14/12 2058 12/15/12 0021 12/15/12 0540 12/16/12 0440  NA 145  --  142 139  K 3.4*  --  3.7 3.3*  CL 106  --  104 103  CO2 23  --  25 28  GLUCOSE 128*  --  133* 103*  BUN 30*  --  30* 23  CREATININE 1.74* 1.63* 1.52* 1.04  CALCIUM 9.6  --  8.8  8.6  MG  --  2.0  --   --    Liver Function Tests:  Recent Labs  12/14/12 2058  AST 277*  ALT 146*  ALKPHOS 97  BILITOT 0.7  PROT 7.3  ALBUMIN 3.7   CBC:  Recent Labs  12/14/12 2058 12/15/12 0021 12/16/12 0440  WBC 15.9* 15.1* 14.5*  NEUTROABS 13.2*  --   --   HGB 13.1 12.6 11.8*  HCT 38.8 37.2 35.8*  MCV 82.0 80.9 82.5  PLT 235 251 228   Cardiac Enzymes:  Recent Labs  12/15/12 0021 12/15/12 0540  TROPONINI 1.13* 2.02*   Thyroid Function Tests:  Recent Labs  12/15/12 0021  TSH 1.141    Imaging: Dg Chest Port 1 View  12/16/2012   *RADIOLOGY REPORT*  Clinical Data: CHF and shortness of breath.  PORTABLE CHEST - 1 VIEW  Comparison: 12/14/2012  Findings: Mild pulmonary venous hypertensive changes present without evidence of overt pulmonary edema.  No pleural fluid is identified.  There is stable cardiomegaly.  IMPRESSION: Mild pulmonary venous hypertensive changes.   Original Report Authenticated By: Aletta Edouard, M.D.   Dg  Chest Port 1 View  12/14/2012   *RADIOLOGY REPORT*  Clinical Data: Chest pain and short of breath  PORTABLE CHEST - 1 VIEW  Comparison: None.  Findings: Exam is lordotic.  The heart is normal.  No effusion, infiltrate, orpneumothorax .  There is a fullness in the upper mediastinum on the left.  Impression:  1. No acute cardiopulmonary process.  2.  Fullness within the left upper mediastinum likely represents a vascular shadow and may relate to lordotic view.  Consider follow- up chest radiograph.   Original Report Authenticated By: Suzy Bouchard, M.D.    Cardiac Studies:  ECG: SR rate 74 normal QT 417 inferolateral T wave changes   Telemetry:   NSR rates 60-70 no further VT   Echo:   Medications:   . amiodarone  150 mg Intravenous Once  . aspirin  81 mg Oral Daily  . carvedilol  6.25 mg Oral BID WC  . furosemide  40 mg Oral Daily  . pantoprazole  40 mg Oral Daily  . simvastatin  20 mg Oral QPM  . sodium chloride  3 mL Intravenous  Q12H  . spironolactone  12.5 mg Oral Daily     . amiodarone (NEXTERONE PREMIX) 360 mg/200 mL dextrose 30 mg/hr (12/16/12 0120)  . heparin 1,150 Units/hr (12/16/12 1004)    Assessment/Plan:  VT:  STable on amiodarone drip per DB.  Will need EP consult post cath continue beta blocker DCM:  ? Nonischemic and ETOH  Troponin elevated for cath tomorrow Chol: continue statin K- suppl  Jenkins Rouge 12/16/2012, 10:07 AM

## 2012-12-17 NOTE — Progress Notes (Signed)
ANTICOAGULATION CONSULT NOTE - Initial Consult  Pharmacy Consult for heparin Indication: elevated troponin in setting of VT  Allergies  Allergen Reactions  . Strawberry Hives and Itching  Patient Measurements: Height: 5\' 4"  (162.6 cm) Weight: 191 lb (86.637 kg) (a scale) IBW/kg (Calculated) : 54.7 Heparin dosing weight: 75kg Vital Signs: Temp: 97.1 F (36.2 C) (06/23 0548) Temp src: Oral (06/23 0548) BP: 132/62 mmHg (06/23 0548) Pulse Rate: 52 (06/23 0548) Labs:  Recent Labs  12/14/12 2058 12/15/12 0021 12/15/12 0540  12/16/12 0440 12/16/12 1554 12/17/12 0525  HGB 13.1 12.6  --   --  11.8*  --  11.2*  HCT 38.8 37.2  --   --  35.8*  --  34.7*  PLT 235 251  --   --  228  --  216  LABPROT  --  14.6  --   --   --   --   --   INR  --  1.16  --   --   --   --   --   HEPARINUNFRC  --   --   --   < > 0.29* 0.29* 0.30  CREATININE 1.74* 1.63* 1.52*  --  1.04  --  1.08  TROPONINI  --  1.13* 2.02*  --   --   --   --   < > = values in this interval not displayed.  Estimated Creatinine Clearance: 52.4 ml/min (by C-G formula based on Cr of 1.08).  Assessment: 67yo female had VT in presence of EMS, appropriately defibrillated, now w/ elevated troponin, being evaluated for ischemia, initially w/ no plan for anticoag given no CP, now w/ further elevation, on heparin. Heparin level therapeutic at 0.30- but at low end of goal. Plan for cath today but patient currently as add on. No issues with heparin and no bleeding reported.   Goal of Therapy:  Heparin level 0.3-0.7 units/ml Monitor platelets by anticoagulation protocol: Yes   Plan:  1. Increase heparin gtt to 1300 units/hr- no bolus secondary to probably cath today. 2. Follow-up post-cath 3. Daily HL and CBC 4. F/u signs/symptoms bleeding  Sloan Leiter, PharmD, BCPS Clinical Pharmacist [phone] 9:33 AM, 12/17/2012

## 2012-12-17 NOTE — Progress Notes (Signed)
Echocardiogram 2D Echocardiogram has been performed.  Stephanie Yates 12/17/2012, 11:56 AM

## 2012-12-18 DIAGNOSIS — I428 Other cardiomyopathies: Secondary | ICD-10-CM

## 2012-12-18 LAB — CBC
Hemoglobin: 11.2 g/dL — ABNORMAL LOW (ref 12.0–15.0)
MCH: 26.9 pg (ref 26.0–34.0)
RBC: 4.16 MIL/uL (ref 3.87–5.11)
WBC: 8.4 10*3/uL (ref 4.0–10.5)

## 2012-12-18 MED ORDER — SODIUM CHLORIDE 0.9 % IR SOLN
80.0000 mg | Status: DC
  Filled 2012-12-18 (×2): qty 2

## 2012-12-18 MED ORDER — CHLORHEXIDINE GLUCONATE 4 % EX LIQD
60.0000 mL | Freq: Once | CUTANEOUS | Status: AC
  Administered 2012-12-18: 4 via TOPICAL
  Filled 2012-12-18: qty 60

## 2012-12-18 MED ORDER — CEFAZOLIN SODIUM-DEXTROSE 2-3 GM-% IV SOLR
2.0000 g | INTRAVENOUS | Status: AC
  Administered 2012-12-19: 2 g via INTRAVENOUS
  Filled 2012-12-18: qty 50

## 2012-12-18 MED ORDER — CHLORHEXIDINE GLUCONATE 4 % EX LIQD
60.0000 mL | Freq: Once | CUTANEOUS | Status: AC
  Administered 2012-12-19: 4 via TOPICAL
  Filled 2012-12-18: qty 60

## 2012-12-18 MED ORDER — SODIUM CHLORIDE 0.9 % IV SOLN
INTRAVENOUS | Status: DC
  Administered 2012-12-19: 06:00:00 via INTRAVENOUS

## 2012-12-18 NOTE — Consult Note (Signed)
ELECTROPHYSIOLOGY CONSULT NOTE    Patient ID: Stephanie Yates MRN: [id number], DOB/AGE: May 04, 1946 67 y.o.  Admit date: 12/14/2012 Date of Consult: 12-18-2012  Primary Physician: Stephanie Huddle, MD Primary Cardiologist: Stephanie Yates (not followed as an outpatient prior to this admission)  Reason for Consultation: VT and NICM  HPI:  Stephanie Yates is a 67 year old female with a past medical history of hypertension, hyperlipidemia, and cardiomyopathy (EF 25-30% in 2003 thought to be related to ETOH).    She has not had outpatient cardiology follow up.  On the day prior to admission, she developed symptoms of nausea, chest pain, and shortness of breath. She did not have tachy palpitations.  Her symptoms worsened overnight and she called 911 the next morning.  She called her daughter.  Her daughter reports that upon arrival that her mother was minimally responsive.  On EMS arrival, she was found to be in VT at a rate of 213bpm.  She was then cardioverted and transferred to [facility] for further evaluation.  Lab work this admission is notable for creat 1.74, BUN 30 (1.04 and 23 after hydration), K 3.4, WBC 15.9, trop peaked at 2.02.  Cardiac catheterization yesterday demonstrated an EF of 10-15% with patent coronary arteries.  The patient denies recent fevers, chills, weight loss or gain, no chest pain, shortness of breath or syncope.  She currently works in a [employer] and does not have significant functional limitations.   She has a remote history of alcohol and tobacco use but quit many years ago.   EP has been asked to evaluate for treatment options.  ROS is negative except as outlined above.    Past Medical History  Diagnosis Date  . CHF (congestive heart failure)   . Hypertension   . Hyperlipemia      Surgical History: History reviewed. No pertinent past surgical history.   Prescriptions prior to admission  Medication Sig Dispense Refill  . aspirin 81 MG chewable tablet Chew  81 mg by mouth daily.      . furosemide (LASIX) 40 MG tablet Take 40 mg by mouth daily.      Marland Kitchen lisinopril (PRINIVIL,ZESTRIL) 20 MG tablet Take 20 mg by mouth daily.      . metoprolol tartrate (LOPRESSOR) 25 MG tablet Take 25 mg by mouth 2 (two) times daily.      . simvastatin (ZOCOR) 20 MG tablet Take 20 mg by mouth every evening.        Inpatient Medications:  . aspirin  81 mg Oral Daily  . carvedilol  6.25 mg Oral BID WC  . furosemide  40 mg Oral Daily  . heparin  5,000 Units Subcutaneous Q8H  . pantoprazole  40 mg Oral Daily  . potassium chloride  40 mEq Oral BID  . simvastatin  20 mg Oral QPM  . sodium chloride  3 mL Intravenous Q12H  . sodium chloride  3 mL Intravenous Q12H  . sodium chloride  3 mL Intravenous Q12H  . spironolactone  12.5 mg Oral Daily    Allergies:  Allergies  Allergen Reactions  . Strawberry Hives and Itching    History   Social History  . Marital Status: Legally Separated    Spouse Name: N/A    Number of Children: N/A  . Years of Education: N/A   Occupational History  . Not on file.   Social History Main Topics  . Smoking status: Former Research scientist (life sciences)  . Smokeless tobacco: Not on file  . Alcohol  Use: No  . Drug Use: No  . Sexually Active: Not on file   Other Topics Concern  . Not on file   Social History Narrative  . No narrative on file     History reviewed. No pertinent family history.    Labs:   Lab Results  Component Value Date   WBC 8.4 12/18/2012   HGB 11.2* 12/18/2012   HCT 34.4* 12/18/2012   MCV 82.7 12/18/2012   PLT 229 12/18/2012    Recent Labs Lab 12/14/12 2058  12/17/12 0525 12/17/12 2019  NA 145  < > 138  --   K 3.4*  < > 4.4  --   CL 106  < > 106  --   CO2 23  < > 26  --   BUN 30*  < > 20  --   CREATININE 1.74*  < > 1.08 0.94  CALCIUM 9.6  < > 8.5  --   PROT 7.3  --   --   --   BILITOT 0.7  --   --   --   ALKPHOS 97  --   --   --   ALT 146*  --   --   --   AST 277*  --   --   --   GLUCOSE 128*  < > 104*  --     < > = values in this interval not displayed. Lab Results  Component Value Date   TROPONINI 2.02* 12/15/2012     Radiology/Studies: Dg Chest Port 1 View 12/16/2012   *RADIOLOGY REPORT*  Clinical Data: CHF and shortness of breath.  PORTABLE CHEST - 1 VIEW  Comparison: 12/14/2012  Findings: Mild pulmonary venous hypertensive changes present without evidence of overt pulmonary edema.  No pleural fluid is identified.  There is stable cardiomegaly.  IMPRESSION: Mild pulmonary venous hypertensive changes.   Original Report Authenticated By: Stephanie Yates, M.D.   [id number] rhythm, rate 74, normal intervals (QRS 114), inferior infarction  TELEMETRY: sinus bradycardia (50's) Physical Exam: Filed Vitals:   12/18/12 0125 12/18/12 0623 12/18/12 0819 12/18/12 0912  BP: 104/47 111/67 117/71 120/58  Pulse: 51 54 57 56  Temp: 97.6 F (36.4 C) 98.3 F (36.8 C)  98.1 F (36.7 C)  TempSrc: Oral Oral  Oral  Resp: 18 20    Height:      Weight:  188 lb 8 oz (85.503 kg)    SpO2: 100% 91%  98%    GEN- The patient is well appearing, alert and oriented x 3 today.   Head- normocephalic, atraumatic Eyes-  Sclera clear, conjunctiva pink Ears- hearing intact Oropharynx- clear Neck- supple, no JVP Lymph- no cervical lymphadenopathy Lungs- Clear to ausculation bilaterally, normal work of breathing Heart- Regular rate and rhythm, no murmurs, rubs or gallops, PMI not laterally displaced GI- soft, NT, ND, + BS Extremities- no clubbing, cyanosis, or edema MS- no significant deformity or atrophy Skin- no rash or lesion Psych- euthymic mood, full affect Neuro- strength and sensation are intact  Assessment and Plan:  1.  Hemodynamically unstable VT requiring emergent cardioversion in field. The patient has a nonischemic CM (EF 10-15%), NYHA Class II CHF, and VT. At this time, she meets criteria for ICD implantation for secondary prevention of sudden death.  Risks, benefits, alternatives to ICD implantation  were discussed in detail with the patient today. The patient  understands that the risks include but are not limited to bleeding, infection, pneumothorax, perforation, tamponade, vascular damage, renal failure,  MI, stroke, death, inappropriate shocks, and lead dislodgement and wishes to proceed.  We will therefore schedule device implantation at the next available time.

## 2012-12-18 NOTE — Progress Notes (Signed)
1545 report received from [place] ,[place].  1600 seen the pt .fully AAO4 .Aware of ICD insertion in am

## 2012-12-18 NOTE — Progress Notes (Signed)
Received pt from PACU s/p cath via bed.  Awake oriented x3. Rt groin dsg intact level 0. No hematoma nor bleeding noted . Rt pedal pulses +2. Pt put on frequent VS. VSS. Instructed pt to be on bedrest for 4 hrs till11pm. Offered and given clear liquid diet. TELE nsr. Family at bedside explained plan of care for patient. Continued to monitor patient.

## 2012-12-19 ENCOUNTER — Encounter (HOSPITAL_COMMUNITY)
Admission: EM | Disposition: A | Discharge status: Home / Self Care | Payer: Self-pay | Source: Home / Self Care | Attending: Internal Medicine

## 2012-12-19 DIAGNOSIS — I472 Ventricular tachycardia: Secondary | ICD-10-CM

## 2012-12-19 HISTORY — PX: IMPLANTABLE CARDIOVERTER DEFIBRILLATOR IMPLANT: SHX5473

## 2012-12-19 HISTORY — PX: IMPLANTABLE CARDIOVERTER DEFIBRILLATOR IMPLANT: SHX5860

## 2012-12-19 LAB — CBC
Platelets: 249 10*3/uL (ref 150–400)
RBC: 4.26 MIL/uL (ref 3.87–5.11)
WBC: 8 10*3/uL (ref 4.0–10.5)

## 2012-12-19 SURGERY — IMPLANTABLE CARDIOVERTER DEFIBRILLATOR IMPLANT
Anesthesia: LOCAL

## 2012-12-19 MED ORDER — CEFAZOLIN SODIUM 1-5 GM-% IV SOLN
1.0000 g | Freq: Four times a day (QID) | INTRAVENOUS | Status: AC
  Administered 2012-12-19 – 2012-12-20 (×3): 1 g via INTRAVENOUS
  Filled 2012-12-19 (×4): qty 50

## 2012-12-19 MED ORDER — FENTANYL CITRATE 0.05 MG/ML IJ SOLN
INTRAMUSCULAR | Status: AC
  Filled 2012-12-19: qty 2

## 2012-12-19 MED ORDER — LIDOCAINE HCL (PF) 1 % IJ SOLN
INTRAMUSCULAR | Status: AC
  Filled 2012-12-19: qty 90

## 2012-12-19 MED ORDER — SODIUM CHLORIDE 0.9 % IV SOLN: INTRAVENOUS | Status: AC

## 2012-12-19 MED ORDER — MIDAZOLAM HCL 5 MG/5ML IJ SOLN
INTRAMUSCULAR | Status: AC
  Filled 2012-12-19: qty 5

## 2012-12-19 MED ORDER — CARVEDILOL 12.5 MG PO TABS
12.5000 mg | ORAL_TABLET | Freq: Two times a day (BID) | ORAL | Status: DC
  Administered 2012-12-19 – 2012-12-20 (×3): 12.5 mg via ORAL
  Filled 2012-12-19 (×4): qty 1

## 2012-12-19 MED ORDER — ACETAMINOPHEN 325 MG PO TABS: 325.0000 mg | ORAL_TABLET | ORAL | Status: DC | PRN

## 2012-12-19 MED ORDER — HEPARIN (PORCINE) IN NACL 2-0.9 UNIT/ML-% IJ SOLN
INTRAMUSCULAR | Status: AC
  Filled 2012-12-19: qty 500

## 2012-12-19 MED ORDER — ONDANSETRON HCL 4 MG/2ML IJ SOLN: 4.0000 mg | Freq: Four times a day (QID) | INTRAMUSCULAR | Status: DC | PRN

## 2012-12-19 NOTE — CV Procedure (Signed)
Stephanie Yates [id number]  [id number]  Preop Dx: VT NICM CHF sinus brady on limited betablockers Postop Dx same/   Cx: none apparent    Procedure: dual  chamber ICD implantation without  intraoperative defibrillation threshold testing  Following the obtaining of informed consent the patient was brought to the electrophysiology laboratory in place of the fluoroscopic table in the supine position.  After routine prep and drape, lidocaine was infiltrated in the prepectoral subclavicular region and an incision was made and carried down to the layer of the prepectoral fascia using electrocautery and sharp dissection. A pocket was formed similarly.  Thereafter  attention was turned to gaining access to the extrathoracic left subclavian vein which was accomplished without w difficulty and without the aspiration of air or puncture of the artery.Two separate venipunctures were accomplished  Sequentially  A 8 French sheath  And 48F sheath were placed through which were   passed a St Jude  Single coil  active fixation IS4 defibrillator lead, model [id number]  serial number [id number] and a St Jude  active fixation atrial lead, serial number [id number] .  They  Were   passed under fluoroscopic guidance to the right ventricular apex and R atrial appendage   respectively.    In its location the bipolar R wave was 18 millivolts, impedance was 643 ohms, the pacing threshold was 0.6 volts at 0.5 msec. Current at threshold was 1.0 mA.  There was no diaphragmatic pacing at 10 V. The current of injury was brisk .  The bipolar P wave was 5.5 millivolts, impedance was 698 ohms, the pacing threshold was 0.9 volts at 0.5 msec. Current at threshold was 1.5 mA.  There was no diaphragmatic pacing at 10 V. The current of injury was brisk .   The leads were secured to the prepectoral fascia and then attached to a Cl[place]ton, serial number  [id number].  Through the device, the bipolar R wave was 1,8 millivolts, impedance was 600  ohms, the pacing threshold was 0.5 volts at 0.5 msec.  The bipolar P wave was 4.6 millivolts, impedance was 630 ohms, the pacing threshold was 1.0 volts at 0.5 msec. High-voltage impedance was  73 ohms.      The pocket was copiously irrigated with antibiotic containing saline solution. Hemostasis was assured, and the device and the leads were placed in the pocket and secured to the prepectoral fascia.  The wound was closed in 2  layers in normal fashion. The wound was washed dried and a benzoin Steri-Strips dressing was then applied. Needle counts, sponge counts and instrument counts were correct at the end of the procedure according to the staff.

## 2012-12-19 NOTE — H&P (View-Only) (Signed)
ELECTROPHYSIOLOGY CONSULT NOTE    Patient ID: Stephanie Yates MRN: [id number], DOB/AGE: [birth year] 67 y.o.  Admit date: 12/14/2012 Date of Consult: 12-18-2012  Primary Physician: Josetta Huddle, MD Primary Cardiologist: Velora Heckler (not followed as an outpatient prior to this admission)  Reason for Consultation: VT and NICM  HPI:  Stephanie Yates is a 67 year old female with a past medical history of hypertension, hyperlipidemia, and cardiomyopathy (EF 25-30% in 2003 thought to be related to ETOH).    She has not had outpatient cardiology follow up.  On the day prior to admission, she developed symptoms of nausea, chest pain, and shortness of breath. She did not have tachy palpitations.  Her symptoms worsened overnight and she called 911 the next morning.  She called her daughter.  Her daughter reports that upon arrival that her mother was minimally responsive.  On EMS arrival, she was found to be in VT at a rate of 213bpm.  She was then cardioverted and transferred to [facility] for further evaluation.  Lab work this admission is notable for creat 1.74, BUN 30 (1.04 and 23 after hydration), K 3.4, WBC 15.9, trop peaked at 2.02.  Cardiac catheterization yesterday demonstrated an EF of 10-15% with patent coronary arteries.  The patient denies recent fevers, chills, weight loss or gain, no chest pain, shortness of breath or syncope.  She currently works in a [employer] and does not have significant functional limitations.   She has a remote history of alcohol and tobacco use but quit many years ago.   EP has been asked to evaluate for treatment options.  ROS is negative except as outlined above.    Past Medical History  Diagnosis Date  . CHF (congestive heart failure)   . Hypertension   . Hyperlipemia      Surgical History: History reviewed. No pertinent past surgical history.   Prescriptions prior to admission  Medication Sig Dispense Refill  . aspirin 81 MG chewable tablet Chew  81 mg by mouth daily.      . furosemide (LASIX) 40 MG tablet Take 40 mg by mouth daily.      Marland Kitchen lisinopril (PRINIVIL,ZESTRIL) 20 MG tablet Take 20 mg by mouth daily.      . metoprolol tartrate (LOPRESSOR) 25 MG tablet Take 25 mg by mouth 2 (two) times daily.      . simvastatin (ZOCOR) 20 MG tablet Take 20 mg by mouth every evening.        Inpatient Medications:  . aspirin  81 mg Oral Daily  . carvedilol  6.25 mg Oral BID WC  . furosemide  40 mg Oral Daily  . heparin  5,000 Units Subcutaneous Q8H  . pantoprazole  40 mg Oral Daily  . potassium chloride  40 mEq Oral BID  . simvastatin  20 mg Oral QPM  . sodium chloride  3 mL Intravenous Q12H  . sodium chloride  3 mL Intravenous Q12H  . sodium chloride  3 mL Intravenous Q12H  . spironolactone  12.5 mg Oral Daily    Allergies:  Allergies  Allergen Reactions  . Strawberry Hives and Itching    History   Social History  . Marital Status: Legally Separated    Spouse Name: N/A    Number of Children: N/A  . Years of Education: N/A   Occupational History  . Not on file.   Social History Main Topics  . Smoking status: Former Research scientist (life sciences)  . Smokeless tobacco: Not on file  . Alcohol  Use: No  . Drug Use: No  . Sexually Active: Not on file   Other Topics Concern  . Not on file   Social History Narrative  . No narrative on file     History reviewed. No pertinent family history.    Labs:   Lab Results  Component Value Date   WBC 8.4 12/18/2012   HGB 11.2* 12/18/2012   HCT 34.4* 12/18/2012   MCV 82.7 12/18/2012   PLT 229 12/18/2012    Recent Labs Lab 12/14/12 2058  12/17/12 0525 12/17/12 2019  NA 145  < > 138  --   K 3.4*  < > 4.4  --   CL 106  < > 106  --   CO2 23  < > 26  --   BUN 30*  < > 20  --   CREATININE 1.74*  < > 1.08 0.94  CALCIUM 9.6  < > 8.5  --   PROT 7.3  --   --   --   BILITOT 0.7  --   --   --   ALKPHOS 97  --   --   --   ALT 146*  --   --   --   AST 277*  --   --   --   GLUCOSE 128*  < > 104*  --     < > = values in this interval not displayed. Lab Results  Component Value Date   TROPONINI 2.02* 12/15/2012     Radiology/Studies: Dg Chest Port 1 View 12/16/2012   *RADIOLOGY REPORT*  Clinical Data: CHF and shortness of breath.  PORTABLE CHEST - 1 VIEW  Comparison: 12/14/2012  Findings: Mild pulmonary venous hypertensive changes present without evidence of overt pulmonary edema.  No pleural fluid is identified.  There is stable cardiomegaly.  IMPRESSION: Mild pulmonary venous hypertensive changes.   Original Report Authenticated By: Aletta Edouard, M.D.   [id number] rhythm, rate 74, normal intervals (QRS 114), inferior infarction  TELEMETRY: sinus bradycardia (50's) Physical Exam: Filed Vitals:   12/18/12 0125 12/18/12 0623 12/18/12 0819 12/18/12 0912  BP: 104/47 111/67 117/71 120/58  Pulse: 51 54 57 56  Temp: 97.6 F (36.4 C) 98.3 F (36.8 C)  98.1 F (36.7 C)  TempSrc: Oral Oral  Oral  Resp: 18 20    Height:      Weight:  188 lb 8 oz (85.503 kg)    SpO2: 100% 91%  98%    GEN- The patient is well appearing, alert and oriented x 3 today.   Head- normocephalic, atraumatic Eyes-  Sclera clear, conjunctiva pink Ears- hearing intact Oropharynx- clear Neck- supple, no JVP Lymph- no cervical lymphadenopathy Lungs- Clear to ausculation bilaterally, normal work of breathing Heart- Regular rate and rhythm, no murmurs, rubs or gallops, PMI not laterally displaced GI- soft, NT, ND, + BS Extremities- no clubbing, cyanosis, or edema MS- no significant deformity or atrophy Skin- no rash or lesion Psych- euthymic mood, full affect Neuro- strength and sensation are intact  Assessment and Plan:  1.  Hemodynamically unstable VT requiring emergent cardioversion in field. The patient has a nonischemic CM (EF 10-15%), NYHA Class II CHF, and VT. At this time, she meets criteria for ICD implantation for secondary prevention of sudden death.  Risks, benefits, alternatives to ICD implantation  were discussed in detail with the patient today. The patient  understands that the risks include but are not limited to bleeding, infection, pneumothorax, perforation, tamponade, vascular damage, renal failure,  MI, stroke, death, inappropriate shocks, and lead dislodgement and wishes to proceed.  We will therefore schedule device implantation at the next available time.

## 2012-12-19 NOTE — Progress Notes (Signed)
1300 report received from cardiac cath . Pt S/P  dual chamber implantable to L  Anterior upper chest with dressing dry and intact . L arm in pLACE . Intravenous fluids were administered, to r hand . Bedrest observed . Marland Kitchen Toilet needs rendered Neurovascular   Condition intact

## 2012-12-19 NOTE — Progress Notes (Signed)
   ELECTROPHYSIOLOGY ROUNDING NOTE    Patient Name: Stephanie Yates Date of Encounter: 12-19-2012    SUBJECTIVE: Patient feels well.  No chest pain or shortness of breath.   Patient admitted with hemodynamically unstable VT that required cardioversion in the field.  She has a longstanding cardiomyopathy with EF of 10-15% this admission.  Plan for ICD implant today.    TELEMETRY: Reviewed telemetry pt in sinus bradycardia with occasional PVC's Filed Vitals:   12/18/12 1505 12/18/12 1811 12/18/12 2036 12/19/12 0536  BP: 133/61 147/73 140/57 140/58  Pulse: 66 85 67 61  Temp: 98.1 F (36.7 C) 97.7 F (36.5 C) 98.4 F (36.9 C) 98.1 F (36.7 C)  TempSrc: Oral Oral Oral Oral  Resp: 18 18 20 18   Height:      Weight:      SpO2: 93% 97% 96% 98%   CURRENT MEDICATIONS: . aspirin  81 mg Oral Daily  . carvedilol  6.25 mg Oral BID WC  . furosemide  40 mg Oral Daily  . gentamicin irrigation  80 mg Irrigation On Call  . heparin  5,000 Units Subcutaneous Q8H  . pantoprazole  40 mg Oral Daily  . potassium chloride  40 mEq Oral BID  . simvastatin  20 mg Oral QPM  . sodium chloride  3 mL Intravenous Q12H  . sodium chloride  3 mL Intravenous Q12H  . sodium chloride  3 mL Intravenous Q12H  . spironolactone  12.5 mg Oral Daily     Intake/Output Summary (Last 24 hours) at 12/19/12 0722 Last data filed at 12/18/12 1812  Gross per 24 hour  Intake    945 ml  Output   1150 ml  Net   -205 ml    LABS: Basic Metabolic Panel:  Recent Labs  12/17/12 0525 12/17/12 2019  NA 138  --   K 4.4  --   CL 106  --   CO2 26  --   GLUCOSE 104*  --   BUN 20  --   CREATININE 1.08 0.94  CALCIUM 8.5  --    CBC:  Recent Labs  12/18/12 0445 12/19/12 0555  WBC 8.4 8.0  HGB 11.2* 11.4*  HCT 34.4* 34.8*  MCV 82.7 81.7  PLT 229 249     Radiology/Studies:  Dg Chest Port 1 View 12/16/2012   *RADIOLOGY REPORT*  Clinical Data: CHF and shortness of breath.  PORTABLE CHEST - 1 VIEW  Comparison:  12/14/2012  Findings: Mild pulmonary venous hypertensive changes present without evidence of overt pulmonary edema.  No pleural fluid is identified.  There is stable cardiomegaly.  IMPRESSION: Mild pulmonary venous hypertensive changes.   Original Report Authenticated By: Aletta Edouard, M.D.   PHYSICAL EXAM Well developed and nourished in no acute distress HENT normal Neck supple with JVP-flat Clear Regular rate and rhythm, no murmurs or gallops Abd-soft with active BS No Clubbing cyanosis edema Skin-warm and dry A & Oriented  Grossly normal sensory and motor function   Principal Problem:   Ventricular tachycardia Active Problems:   Systolic heart failure   Hypertension   Chronic kidney disease (CKD)   For ICD today  Reviewed with pt indications andn physiilogy Have reviewed the potential benefits and risks of ICD implantation including but not limited to death, perforation of heart or lung, lead dislodgement, infection,  device malfunction and inappropriate shocks.  The patient  express understanding  and are willing to proceed.

## 2012-12-19 NOTE — Interval H&P Note (Signed)
History and Physical Interval Note:  12/19/2012 11:15 AM  Stephanie Yates  has presented today for surgery, with the diagnosis of Heart block  The various methods of treatment have been discussed with the patient and family. After consideration of risks, benefits and other options for treatment, the patient has consented to  Procedure(s): IMPLANTABLE CARDIOVERTER DEFIBRILLATOR IMPLANT (N/A) as a surgical intervention .  The patient's history has been reviewed, patient examined, no change in status, stable for surgery.  I have reviewed the patient's chart and labs.  Questions were answered to the patient's satisfaction.     Virl Axe

## 2012-12-20 ENCOUNTER — Other Ambulatory Visit: Payer: Self-pay | Admitting: Physician Assistant

## 2012-12-20 ENCOUNTER — Inpatient Hospital Stay (HOSPITAL_COMMUNITY): Payer: Medicare PPO

## 2012-12-20 ENCOUNTER — Encounter (HOSPITAL_COMMUNITY): Payer: Self-pay | Admitting: Physician Assistant

## 2012-12-20 DIAGNOSIS — N390 Urinary tract infection, site not specified: Secondary | ICD-10-CM

## 2012-12-20 DIAGNOSIS — E875 Hyperkalemia: Secondary | ICD-10-CM

## 2012-12-20 DIAGNOSIS — I428 Other cardiomyopathies: Secondary | ICD-10-CM

## 2012-12-20 DIAGNOSIS — N289 Disorder of kidney and ureter, unspecified: Secondary | ICD-10-CM

## 2012-12-20 DIAGNOSIS — I5043 Acute on chronic combined systolic (congestive) and diastolic (congestive) heart failure: Secondary | ICD-10-CM

## 2012-12-20 LAB — BASIC METABOLIC PANEL
Chloride: 100 mEq/L (ref 96–112)
Creatinine, Ser: 1.14 mg/dL — ABNORMAL HIGH (ref 0.50–1.10)
GFR calc Af Amer: 56 mL/min — ABNORMAL LOW (ref >90)
Potassium: 6.5 mEq/L (ref 3.5–5.1)
Sodium: 135 mEq/L (ref 135–145)

## 2012-12-20 LAB — POTASSIUM: Potassium: 5.3 mEq/L — ABNORMAL HIGH (ref 3.5–5.1)

## 2012-12-20 LAB — CBC
Platelets: 210 10*3/uL (ref 150–400)
RDW: 14.4 % (ref 11.5–15.5)
WBC: 10.2 10*3/uL (ref 4.0–10.5)

## 2012-12-20 MED ORDER — POTASSIUM CHLORIDE CRYS ER 20 MEQ PO TBCR: 10.0000 meq | EXTENDED_RELEASE_TABLET | Freq: Every day | ORAL | Status: DC

## 2012-12-20 MED ORDER — CARVEDILOL 12.5 MG PO TABS: 12.5000 mg | ORAL_TABLET | Freq: Two times a day (BID) | ORAL | Status: DC

## 2012-12-20 MED ORDER — SPIRONOLACTONE 12.5 MG HALF TABLET: 12.5000 mg | ORAL_TABLET | Freq: Every day | ORAL | Status: DC

## 2012-12-20 MED ORDER — LISINOPRIL 5 MG PO TABS: 5.0000 mg | ORAL_TABLET | Freq: Every day | ORAL | Status: DC

## 2012-12-20 MED ORDER — LISINOPRIL 5 MG PO TABS
5.0000 mg | ORAL_TABLET | Freq: Every day | ORAL | Status: DC
  Administered 2012-12-20: 5 mg via ORAL
  Filled 2012-12-20: qty 1

## 2012-12-20 NOTE — Progress Notes (Signed)
Plan was for discharge today. Given newly added spironolactone with concomitant ACEi and potassium supplementation, BMET was checked prior to discharge (last panel drawn on 6/23). Called lab and potassium result noted to be high at 6.5. There was some mild hemolysis. Will check a repeat potassium stat. Hold potassium supplementation and ACEi/spiro for now. Will hold off on discharge.     Jacquelynn Cree, PA-C 12/20/2012 3:22 PM

## 2012-12-20 NOTE — Discharge Summary (Signed)
Discharge Summary   Patient ID: Stephanie Yates,  MRN: [id number], DOB/AGE: Sep 19, 1945 67 y.o.  Admit date: 12/14/2012 Discharge date: 12/21/2012  Primary Physician: Dr. Inda Merlin Primary Cardiologist: seen in consultation by D. Bensimhon, MD this admission   Discharge Diagnoses Principal Problem:   Ventricular tachycardia Active Problems:   Nonischemic cardiomyopathy   Acute on chronic systolic CHF (congestive heart failure)   Hypertension   Hyperkalemia   UTI (urinary tract infection)   Acute renal insufficiency   Hypokalemia   History of tobacco abuse   History of ETOH abuse   Elevated transaminase level   Sinus bradycardia   Allergies Allergies  Allergen Reactions  . Strawberry Hives and Itching    Diagnostic Studies/Procedures  PORTABLE CHEST X-RAY - 12/14/12  Impression:  1. No acute cardiopulmonary process.  2. Fullness within the left upper mediastinum likely represents a  vascular shadow and may relate to lordotic view. Consider follow-  up chest radiograph.  PORTABLE CHEST X-RAY - 12/16/12  IMPRESSION:  Mild pulmonary venous hypertensive changes.  CARDIAC CATHETERIZATION - 12/17/12  Hemodynamics  RA 11  RV 43/15  PA 36/21 mean 29  PCWP 20  LV 146/32  AO 149/71 mean 100  Oxygen saturations:  PA 60  AO 98  Cardiac Output (Fick) 4.4  Cardiac Index (Fick) 2.3  Coronary angiography:  Coronary dominance: right  Left mainstem: Widely patent, no obstructive disease  Left anterior descending (LAD): Diffuse luminal irregularity with no significant stenosis. Reaches the LV apex  Left circumflex (LCx): Large caliber vessel with 2 large OM branches. No significant obstructive disease.  Right coronary artery (RCA): Dominant vessel, medium in caliber, no obstructive disease.  Left ventriculography: Severe global LV systolic dysfunction, LVEF 10-15%.  Final Conclusions:  1. Severe NICM  2. Patent coronary arteries without significant stenosis  DUAL  CHAMBER ICD IMPLANTATION WITHOUT INTRAOPERATIVE DEFIBRILLATION THRESHOLD TESTING - 12/18/12  Successful implantation of a St. Jude dual-chamber ICD, serial number [id number]  PA/LATERAL CHEST X-RAY - 12/20/12  IMPRESSION:  1. Interval placement of left anterior chest wall dual lead pacemaker without evidence of complication.  2. Improved pulmonary venous congestion.  History of Present Illness Stephanie Yates is a 67 y.o. female who was admitted to [facility] with the above problem list.    She has a history of present nonsustained VT (2003), previously unspecified CHF (previous EF 25-30% on 2003 echo), remote tobacco abuse, hypertension and hyperlipidemia. She works in [employer] at an [employer]. She reports being fairly functional at baseline. She is typically able to walk multiple blocks and flexors without difficulty. She been experiencing mild epigastric discomfort over the past one to 2 days prior to admission with associated shortness of breath. Her symptoms progressed until she ultimately developed altered mental status with your family call EMS. EKG in the field revealed a sustained VT which was appropriately defibrillated at 100J with success. She was then transported emergently to [facility] emergency department.  There, she was given an amiodarone bolus and started on an infusion. EKG revealed normal sinus rhythm, anterolateral T-wave inversions, left axis deviation and inferior intraventricular conduction delay. There were no overt ST changes. Initial troponin I elevated at 0.35. Initial pro BNP [place] at [address]. She was mildly hypokalemic at 3.4. Mg 2.0. Initial portable chest x-ray indicated no acute pulmonary process and the superior mediastinal abnormality favored to represent vessel shadowing in a lordotic view. On obtaining further history from the patient, she denied antecedent lightheadedness  or presyncope until that evening. The decision was  made to admit the patient for further management and evaluation.  Hospital Course   She was continued on IV amiodarone and placed on heparin. Keeping K > 4, Mg > 2 was stressed. LFTs revealed an elevated AST (277) and ALT (146). TSH did return WNL. Two subsequent sets of troponin-I returned mildly elevated and up-trending (peak 2.0). She remained stable over the weekend and the plan was made to proceed with cardiac catheterization the following Monday. Spironolactone was added. She did have an episode of bradycardia (HR 40s), and amiodarone was discontinued. Note, there are two separate progress notes in Epic indicating that an echocardiogram was performed. A formal interpretation is not available.   She was informed, consented and prepped for cardiac catheterization 12/17/12 which was accessed via the R femoral artery. As above, this revealed largely nonobstructive CAD aside from minor luminal irregularities; EF 10-15%. She tolerated the procedure well without complications. The electrophysiology team was consulted for consideration of ICD placement. NICM was suspected to be secondary to prior EtOH abuse or primary idiopathic dilated CM. She was deemed to have met criteria for ICD placement (EF 10-15%, NYHA class II CHF and sustained, HD unstable VT). The risks, benefits and details of the procedure were explained to the patient who understood and agreed to proceed.   She was consented. As above, she received a St. Jude dual-chamber ICD on 12/19/12. She tolerated the procedure well without immediate complications. She remained stable overnight. Follow-up CXR indicated no evidence of pneumothorax or abnormal device placement. ICD was determined to be functioning normally, and she was deemed stable for discharge.   A pre-discharge BMET was obtained given ACEi, spironolactone and KCl. This revealed an initial potassium of 6.5. The sample was hemolyzed and a repeat potassium level returned at 5.3. Potassium  supplementation was discontinued. Given elevated AST/ALT this admission, empiric statin therapy was deferred. This can be readdressed on follow-up. She will follow-up within 1 week for wound care and routine post-hospital follow-up. A repeat BMET will be checked at this time. Additionally, given evidence of UTI this admission, ciprofloxacin was called in to her pharmacy. She will be informed of this change. This information, including supplemental post-device instructions, has been clearly outlined in the discharge AVS.   Discharge Vitals:  Blood pressure 118/55, pulse 71, temperature 98.1 F (36.7 C), temperature source Oral, resp. rate 20, height 5\' 4"  (1.626 m), weight 85.6 kg (188 lb 11.4 oz), SpO2 95.00%.   Labs: Recent Labs     12/19/12  0555  12/20/12  0450  WBC  8.0  10.2  HGB  11.4*  13.7  HCT  34.8*  40.4  MCV  81.7  81.1  PLT  249  210    Recent Labs Lab 12/14/12 2058  12/16/12 0440 12/17/12 0525 12/17/12 2019 12/20/12 1419 12/20/12 1535  NA 145  < > 139 138  --  135  --   K 3.4*  < > 3.3* 4.4  --  6.5* 5.3*  CL 106  < > 103 106  --  100  --   CO2 23  < > 28 26  --  22  --   BUN 30*  < > 23 20  --  24*  --   CREATININE 1.74*  < > 1.04 1.08 0.94 1.14*  --   CALCIUM 9.6  < > 8.6 8.5  --  9.5  --   PROT 7.3  --   --   --   --   --   --  BILITOT 0.7  --   --   --   --   --   --   ALKPHOS 97  --   --   --   --   --   --   ALT 146*  --   --   --   --   --   --   AST 277*  --   --   --   --   --   --   GLUCOSE 128*  < > 103* 104*  --  110*  --   < > = values in this interval not displayed.  Disposition:  Discharge Orders   Future Appointments Provider Department Dept Phone   12/27/2012 9:30 AM Andrez Grime, PA-C [place]   12/27/2012 9:45 AM [facility] [place] [facility] [place]) [phone]   Future Orders Complete By Expires     Diet - low sodium heart healthy  As directed     Increase activity slowly   As directed           Follow-up Information   Follow up with Ileene Hutchinson, PA-C On 12/27/2012. (At 9:30 AM for post-hospital follow-up and wound check and labwork afterwards. )    Contact information:   [address] [place] [address] [phone]      Discharge Medications:    Medication List    STOP taking these medications       metoprolol tartrate 25 MG tablet  Commonly known as:  LOPRESSOR      TAKE these medications       aspirin 81 MG chewable tablet  Chew 81 mg by mouth daily.     carvedilol 12.5 MG tablet  Commonly known as:  COREG  Take 1 tablet (12.5 mg total) by mouth 2 (two) times daily with a meal.     furosemide 40 MG tablet  Commonly known as:  LASIX  Take 40 mg by mouth daily.     lisinopril 5 MG tablet  Commonly known as:  PRINIVIL,ZESTRIL  Take 1 tablet (5 mg total) by mouth daily.     spironolactone 12.5 mg Tabs  Commonly known as:  ALDACTONE  Take 0.5 tablets (12.5 mg total) by mouth daily.       Outstanding Labs/Studies: BMET on 7/3  Duration of Discharge Encounter: Greater than 30 minutes including physician time.  Signed, R. Valeria Batman, PA-C 12/21/2012, 5:35 PM

## 2012-12-20 NOTE — Progress Notes (Signed)
   ELECTROPHYSIOLOGY ROUNDING NOTE    Patient Name: Stephanie Yates Date of Encounter: 12-20-2012    [place] feels well.  No chest pain or shortness of breath.  S/p dual chamber ICD implant 12-19-2012 for hemodynamically unstable VT in the setting of non ischemic cardiomyopathy.   TELEMETRY: Reviewed telemetry pt in sinus rhythm Filed Vitals:   12/19/12 1630 12/19/12 1700 12/19/12 2027 12/20/12 0553  BP: 112/90 144/68 135/67 125/84  Pulse: 68 69 71 66  Temp:   98.5 F (36.9 C) 98.1 F (36.7 C)  TempSrc:   Oral Oral  Resp:   20 20  Height:      Weight:    188 lb 11.4 oz (85.6 kg)  SpO2:   94% 96%    Intake/Output Summary (Last 24 hours) at 12/20/12 0848 Last data filed at 12/20/12 0747  Gross per 24 hour  Intake    990 ml  Output   2360 ml  Net  -1370 ml    LABS: Basic Metabolic Panel:  Recent Labs  12/17/12 2019  CREATININE 0.94   CBC:  Recent Labs  12/19/12 0555 12/20/12 0450  WBC 8.0 10.2  HGB 11.4* 13.7  HCT 34.8* 40.4  MCV 81.7 81.1  PLT 249 210   Radiology/Studies:  Dg Chest 2 View 12/20/2012   *RADIOLOGY REPORT*  Clinical Data: Post pacemaker insertion  CHEST - 2 VIEW  Comparison: 12/16/2012; 12/14/2012  Findings: Grossly unchanged cardiac silhouette and mediastinal contours with atherosclerotic calcifications within the thoracic aorta.  Interval placement of a left anterior chest wall dual lead AICD/pacemaker with lead tips overlying the expected location of the right atrium and ventricle.  No pneumothorax. Suspected trace left-sided pleural effusion with associated left basilar opacities. Unchanged bones.  IMPRESSION: 1.  Interval placement of left anterior chest wall dual lead pacemaker without evidence of complication. 2.  Improved pulmonary venous congestion.   Original Report Authenticated By: Jake Seats, MD   PHYSICAL EXAM Left chest without hematoma or ecchymosi. Well developed and nourished in no acute distress HENT normal Neck  supple with JVP-flat Clear Regular rate and rhythm, no murmurs or gallops  Abd-soft with active BS No Clubbing cyanosis edema Skin-warm and dry A & Oriented  Grossly normal sensory and motor function   DEVICE INTERROGATION: Device interrogated by industry.  Lead values including impedence, sensing, threshold within normal values.    Principal Problem:   Ventricular tachycardia Active Problems:   Systolic heart failure   Hypertension   Chronic kidney disease (CKD)   Wound care, arm mobility, restrictions reviewed with patient. Follow up scheduled with Ileene Hutchinson, PA in 1 week for early follow up and wound check appt.   Add ace--lisnopril with BMET in 1 week   Discharge to home

## 2012-12-20 NOTE — Progress Notes (Signed)
Pt being dc to home, pt given dc instructions, medications and follow up appointments reviewed, pt leaving via wheelchair with family, pt stable

## 2012-12-21 ENCOUNTER — Telehealth: Payer: Self-pay | Admitting: Internal Medicine

## 2012-12-21 DIAGNOSIS — R001 Bradycardia, unspecified: Secondary | ICD-10-CM

## 2012-12-21 DIAGNOSIS — Z87891 Personal history of nicotine dependence: Secondary | ICD-10-CM

## 2012-12-21 DIAGNOSIS — E876 Hypokalemia: Secondary | ICD-10-CM

## 2012-12-21 DIAGNOSIS — F1011 Alcohol abuse, in remission: Secondary | ICD-10-CM

## 2012-12-21 HISTORY — DX: Bradycardia, unspecified: R00.1

## 2012-12-21 MED ORDER — SIMVASTATIN 20 MG PO TABS: 20.0000 mg | ORAL_TABLET | Freq: Every evening | ORAL | Status: DC

## 2012-12-21 NOTE — Telephone Encounter (Signed)
Spoke with patient's daughter. Advised that Cipro is for a UTI. Reviewed all medications with her. Needs Simvastatin to be sent to [facility]. Advised will take care of this.

## 2012-12-21 NOTE — Telephone Encounter (Signed)
Attempted to call pt at home phone number; phone is not taken messages. I left a message on the work number that pt provided.

## 2012-12-21 NOTE — Telephone Encounter (Addendum)
Per after hours, Nicole Kindred PA, wants pt called re UA showed UTI after pt's d/c , was unable to reach pt last night so pt not aware, Cipro was called in to [facility]

## 2012-12-22 DIAGNOSIS — Z029 Encounter for administrative examinations, unspecified: Secondary | ICD-10-CM

## 2012-12-27 ENCOUNTER — Other Ambulatory Visit: Payer: Medicare PPO

## 2012-12-27 ENCOUNTER — Encounter: Payer: Medicare PPO | Admitting: Cardiology

## 2012-12-31 NOTE — Progress Notes (Signed)
ELECTROPHYSIOLOGY OFFICE NOTE  Patient ID: Stephanie Yates MRN: [id number], DOB/AGE: 1945-11-26   Date of Visit: 01/01/2013  Primary Physician: Henrine Screws, MD Primary Cardiologist / EP: Haroldine Laws, MD / Caryl Comes, MD Reason for Visit: Hospital follow-up  History of Present Illness  Stephanie Yates is a 67 y.o. female with a NICM, EF [id number], chronic systolic HF, normal coronary arteries and sustained VT s/p recent ICD implant who presents today for hospital followup. She is accompanied by her daughter.   Please see discharge summary from 12/21/2012 for full details. Briefly she presented with AMS and sustained VT. She was admitted and placed on IV amiodarone infusion. She underwent cardiac cath which revealed normal coronary arteries and severe LV dysfunction. She was initiated on appropriate HF medical therapy. EP was consulted for consideration of ICD. In setting of sustained VT, she met criteria for ICD implantation as secondary prevention SCD. She underwent dual chamber ICD implantation on 12/19/2012.   Since discharge, she reports she is doing well and has no complaints. She denies chest pain or shortness of breath. She denies palpitations, dizziness, near syncope or syncope. She denies LE swelling, orthopnea, PND or recent weight gain. She is compliant and tolerating medications without difficulty.  Past Medical History Past Medical History  Diagnosis Date  . Hypertension   . Hyperlipidemia   . Nonischemic cardiomyopathy     EF 10-15% on 11/2012 cath  . Chronic systolic CHF (congestive heart failure)   . History of tobacco abuse     Past Surgical History Past Surgical History  Procedure Laterality Date  . Implantable cardioverter defibrillator implant  12/19/2012    St. Jude dual-chamber ICD, serial number [id number]    Allergies/Intolerances Allergies  Allergen Reactions  . Strawberry Hives and Itching   Current Home Medications Current Outpatient Prescriptions    Medication Sig Dispense Refill  . aspirin 81 MG chewable tablet Chew 81 mg by mouth daily.      . carvedilol (COREG) 12.5 MG tablet Take 1 tablet (12.5 mg total) by mouth 2 (two) times daily with a meal.  60 tablet  3  . furosemide (LASIX) 40 MG tablet Take 40 mg by mouth daily.      Marland Kitchen lisinopril (PRINIVIL,ZESTRIL) 5 MG tablet Take 1 tablet (5 mg total) by mouth daily.  30 tablet  3  . simvastatin (ZOCOR) 20 MG tablet Take 1 tablet (20 mg total) by mouth every evening.  30 tablet  3  . spironolactone (ALDACTONE) 12.5 mg TABS Take 0.5 tablets (12.5 mg total) by mouth daily.  30 tablet  2   No current facility-administered medications for this visit.   Social History History   Social History  . Marital Status: Legally Separated    Spouse Name: N/A    Number of Children: N/A  . Years of Education: N/A   Occupational History  . Not on file.   Social History Main Topics  . Smoking status: Former Research scientist (life sciences)  . Smokeless tobacco: Not on file  . Alcohol Use: No  . Drug Use: No  . Sexually Active: Not on file   Other Topics Concern  . Not on file   Social History Narrative  . No narrative on file    Review of Systems General: No chills, fever, night sweats or weight changes Cardiovascular: No chest pain, dyspnea on exertion, edema, orthopnea, palpitations, paroxysmal nocturnal dyspnea Dermatological: No rash, lesions or masses Respiratory: No cough, dyspnea Urologic: No hematuria, dysuria Abdominal: No nausea, vomiting, diarrhea, bright  red blood per rectum, melena, or hematemesis Neurologic: No visual changes, weakness, changes in mental status All other systems reviewed and are otherwise negative except as noted above.  Physical Exam Vitals: Blood pressure 142/60, pulse 55, height 5\' 4"  (1.626 m), weight 181 lb 6.4 oz (82.283 kg).  General: Well developed, well appearing 67 y.o. female in no acute distress. HEENT: Normocephalic, atraumatic. EOMs intact. Sclera nonicteric.  Oropharynx clear.  Neck: Supple without bruits. No JVD. Lungs: Respirations regular and unlabored, CTA bilaterally. No wheezes, rales or rhonchi. Heart: RRR. S1, S2 present. No murmurs, rub, S3 or S4. Abdomen: Soft, non-distended.  Extremities: No clubbing, cyanosis or edema. PT/Radials 2+ and equal bilaterally. Psych: Normal affect. Neuro: Alert and oriented X 3. Moves all extremities spontaneously.   Diagnostics Cardiac catheterization 12/17/2012 Hemodynamics  RA 11  RV 43/15  PA 36/21 mean 29  PCWP 20  LV 146/32  AO 149/71 mean 100  Oxygen saturations:  PA 60  AO 98  Cardiac Output (Fick) 4.4  Cardiac Index (Fick) 2.3  Coronary angiography:  Coronary dominance: right  Left mainstem: Widely patent, no obstructive disease  Left anterior descending (LAD): Diffuse luminal irregularity with no significant stenosis. Reaches the LV apex  Left circumflex (LCx): Large caliber vessel with 2 large OM branches. No significant obstructive disease.  Right coronary artery (RCA): Dominant vessel, medium in caliber, no obstructive disease.  Left ventriculography: Severe global LV systolic dysfunction, LVEF 10-15%.  Final Conclusions:  1. Severe NICM  2. Patent coronary arteries without significant stenosis   Device interrogation today - Normal device function. Thresholds and sensing consistent with previous device measurements. Impedance trends stable over time. No evidence of any ventricular arrhythmias. No mode switches. Histogram distribution appropriate for patient and level of activity. No changes made this session. Device programmed at appropriate safety margins. Device programmed to optimize intrinsic conduction. Estimated longevity 6 - 7.2 years.   CBC and BMET at discharge - sodium 135, potassium 5.3, BUN 24, Cr 1.14, WBC 8,000, Hgb 11.4, Hct 34.8, Plt 249,000 LFTs done on admission - ALT 146, AST 277, alk phos 97   Assessment and Plan 1. Sustained VT s/p ICD implant for  secondary prevention SCD Normal device function No arrhythmias No programming changes made Return for follow-up in 3 months 2. NICM, EF 10-15% with chronic systolic HF Stable; euvolemic by exam today Continue medical therapy and up-titrate as BP and HR allow Will check BMET and if renal function and potassium level stable, will up-titrate lisinopril  Counseled regarding low sodium, fluid restricted diet and daily weights Counseled regarding importance of medication compliance 3. HTN BP mildly elevated today; as above, will check BMET and if renal function and potassium level stable, will up-titrate lisinopril for better BP control 4. Tobacco abuse Counseled regarding the importance of cessation  Signed, Ileene Hutchinson, PA-C 01/07/2013, 9:56 AM

## 2013-01-01 ENCOUNTER — Encounter: Payer: Self-pay | Admitting: Cardiology

## 2013-01-01 ENCOUNTER — Ambulatory Visit (INDEPENDENT_AMBULATORY_CARE_PROVIDER_SITE_OTHER): Payer: Medicare PPO | Admitting: *Deleted

## 2013-01-01 ENCOUNTER — Ambulatory Visit (INDEPENDENT_AMBULATORY_CARE_PROVIDER_SITE_OTHER): Payer: Medicare PPO | Admitting: Cardiology

## 2013-01-01 ENCOUNTER — Encounter: Payer: Self-pay | Admitting: Internal Medicine

## 2013-01-01 VITALS — BP 142/60 | HR 55 | Ht 64.0 in | Wt 181.4 lb

## 2013-01-01 DIAGNOSIS — Z95810 Presence of automatic (implantable) cardiac defibrillator: Secondary | ICD-10-CM

## 2013-01-01 DIAGNOSIS — I10 Essential (primary) hypertension: Secondary | ICD-10-CM

## 2013-01-01 DIAGNOSIS — I428 Other cardiomyopathies: Secondary | ICD-10-CM

## 2013-01-01 DIAGNOSIS — I472 Ventricular tachycardia, unspecified: Secondary | ICD-10-CM

## 2013-01-01 DIAGNOSIS — E875 Hyperkalemia: Secondary | ICD-10-CM

## 2013-01-01 DIAGNOSIS — F17200 Nicotine dependence, unspecified, uncomplicated: Secondary | ICD-10-CM

## 2013-01-01 DIAGNOSIS — Z720 Tobacco use: Secondary | ICD-10-CM

## 2013-01-01 DIAGNOSIS — I5022 Chronic systolic (congestive) heart failure: Secondary | ICD-10-CM

## 2013-01-01 LAB — ICD DEVICE OBSERVATION
AL AMPLITUDE: 5 mv
DEVICE MODEL ICD: 7093938
HV IMPEDENCE: 69 Ohm
MODE SWITCH EPISODES: 0
PACEART VT: 0
TZON-0003FASTVT: 300 ms
TZON-0003SLOWVT: 331.4 ms
VF: 0

## 2013-01-01 NOTE — Patient Instructions (Addendum)
CALL OFFICE TO MAKE October APPOINTMENT EARLY September [phone]  Your physician wants you to follow-up in: [place] will receive a reminder letter in the mail two months in advance. If you don't receive a letter, please call our office to schedule the follow-up appointment.  Your physician recommends that you return for lab work in: [place])

## 2013-01-02 ENCOUNTER — Other Ambulatory Visit: Payer: Self-pay | Admitting: *Deleted

## 2013-01-02 DIAGNOSIS — N289 Disorder of kidney and ureter, unspecified: Secondary | ICD-10-CM

## 2013-01-02 LAB — BASIC METABOLIC PANEL
Chloride: 107 mEq/L (ref 96–112)
Creatinine, Ser: 1.8 mg/dL — ABNORMAL HIGH (ref 0.4–1.2)

## 2013-01-09 ENCOUNTER — Other Ambulatory Visit (INDEPENDENT_AMBULATORY_CARE_PROVIDER_SITE_OTHER): Payer: Medicare PPO

## 2013-01-09 DIAGNOSIS — N289 Disorder of kidney and ureter, unspecified: Secondary | ICD-10-CM

## 2013-01-09 LAB — BASIC METABOLIC PANEL
Calcium: 9.6 mg/dL (ref 8.4–10.5)
GFR: 22.95 mL/min — ABNORMAL LOW (ref >60.00)
Sodium: 137 mEq/L (ref 135–145)

## 2013-01-10 ENCOUNTER — Telehealth: Payer: Self-pay | Admitting: Internal Medicine

## 2013-01-10 ENCOUNTER — Encounter: Payer: Self-pay | Admitting: *Deleted

## 2013-01-10 ENCOUNTER — Other Ambulatory Visit: Payer: Self-pay | Admitting: *Deleted

## 2013-01-10 DIAGNOSIS — N289 Disorder of kidney and ureter, unspecified: Secondary | ICD-10-CM

## 2013-01-10 NOTE — Telephone Encounter (Signed)
Discussed with brooke,pa, the pt is able to return to work Monday. She can not lift over 10 lbs and she can not drive. Letter will be placed at the front desk for the pt to pick up

## 2013-01-10 NOTE — Telephone Encounter (Signed)
New Prob  Pt is calling regarding a letter to return to work. She was wondering if it had been completed yet.

## 2013-01-16 ENCOUNTER — Encounter: Payer: Self-pay | Admitting: *Deleted

## 2013-01-16 ENCOUNTER — Other Ambulatory Visit (INDEPENDENT_AMBULATORY_CARE_PROVIDER_SITE_OTHER): Payer: Medicare PPO

## 2013-01-16 ENCOUNTER — Telehealth: Payer: Self-pay | Admitting: *Deleted

## 2013-01-16 DIAGNOSIS — N289 Disorder of kidney and ureter, unspecified: Secondary | ICD-10-CM

## 2013-01-16 LAB — BASIC METABOLIC PANEL
Chloride: 106 mEq/L (ref 96–112)
Potassium: 4.8 mEq/L (ref 3.5–5.1)
Sodium: 140 mEq/L (ref 135–145)

## 2013-01-16 NOTE — Telephone Encounter (Signed)
Pt walked into the office, she needs to know when the lifting restrictions will be up. Explained they will be up Monday 01-21-13. Will place note at the front desk for her to pick up.

## 2013-02-04 ENCOUNTER — Telehealth (HOSPITAL_COMMUNITY): Payer: Self-pay | Admitting: Cardiology

## 2013-02-04 NOTE — Telephone Encounter (Signed)
Pt will be D/C from services, pt is back to work. Can no longer see pt for [facility] services

## 2013-03-19 ENCOUNTER — Encounter: Payer: Self-pay | Admitting: *Deleted

## 2013-03-19 ENCOUNTER — Other Ambulatory Visit: Payer: Self-pay

## 2013-03-19 MED ORDER — SPIRONOLACTONE 25 MG PO TABS
ORAL_TABLET | ORAL | Status: DC
Start: 2013-03-19 — End: 2014-05-20

## 2013-03-19 MED ORDER — SPIRONOLACTONE 12.5 MG HALF TABLET: 12.5000 mg | ORAL_TABLET | Freq: Every day | ORAL | Status: DC

## 2013-03-27 ENCOUNTER — Encounter: Payer: Medicare PPO | Admitting: Internal Medicine

## 2013-03-27 ENCOUNTER — Ambulatory Visit: Payer: Medicare PPO | Admitting: Internal Medicine

## 2013-04-01 ENCOUNTER — Encounter: Payer: Self-pay | Admitting: Internal Medicine

## 2013-04-01 ENCOUNTER — Ambulatory Visit (INDEPENDENT_AMBULATORY_CARE_PROVIDER_SITE_OTHER): Payer: Medicare PPO | Admitting: Internal Medicine

## 2013-04-01 VITALS — BP 142/79 | HR 64 | Ht 60.0 in | Wt 186.0 lb

## 2013-04-01 DIAGNOSIS — I498 Other specified cardiac arrhythmias: Secondary | ICD-10-CM

## 2013-04-01 DIAGNOSIS — I428 Other cardiomyopathies: Secondary | ICD-10-CM

## 2013-04-01 DIAGNOSIS — R001 Bradycardia, unspecified: Secondary | ICD-10-CM

## 2013-04-01 DIAGNOSIS — I472 Ventricular tachycardia: Secondary | ICD-10-CM

## 2013-04-01 DIAGNOSIS — I10 Essential (primary) hypertension: Secondary | ICD-10-CM

## 2013-04-01 DIAGNOSIS — Z95810 Presence of automatic (implantable) cardiac defibrillator: Secondary | ICD-10-CM | POA: Insufficient documentation

## 2013-04-01 DIAGNOSIS — I480 Paroxysmal atrial fibrillation: Secondary | ICD-10-CM | POA: Insufficient documentation

## 2013-04-01 DIAGNOSIS — I493 Ventricular premature depolarization: Secondary | ICD-10-CM | POA: Insufficient documentation

## 2013-04-01 DIAGNOSIS — I4949 Other premature depolarization: Secondary | ICD-10-CM

## 2013-04-01 DIAGNOSIS — I5022 Chronic systolic (congestive) heart failure: Secondary | ICD-10-CM

## 2013-04-01 DIAGNOSIS — I4891 Unspecified atrial fibrillation: Secondary | ICD-10-CM

## 2013-04-01 LAB — ICD DEVICE OBSERVATION
AL THRESHOLD: 0.75 V
ATRIAL PACING ICD: 4.8 pct
DEVICE MODEL ICD: 7093938
HV IMPEDENCE: 75 Ohm
MODE SWITCH EPISODES: 409
RV LEAD AMPLITUDE: 11.8 mv
TZAT-0001FASTVT: 1
TZAT-0001SLOWVT: 1
TZAT-0013FASTVT: 2
TZAT-0013SLOWVT: 3
TZAT-0018FASTVT: NEGATIVE
TZON-0003SLOWVT: 331.4 ms
TZST-0001FASTVT: 4
TZST-0001SLOWVT: 3
TZST-0001SLOWVT: 4
TZST-0003FASTVT: 30 J
TZST-0003FASTVT: 36 J
TZST-0003FASTVT: 36 J
TZST-0003SLOWVT: 36 J
VENTRICULAR PACING ICD: 4.3 pct

## 2013-04-01 NOTE — Patient Instructions (Addendum)
Your physician recommends that you have lab work today: BMET, Mg  Your physician has requested that you have an echocardiogram. Echocardiography is a painless test that uses sound waves to create images of your heart. It provides your doctor with information about the size and shape of your heart and how well your heart's chambers and valves are working. This procedure takes approximately one hour. There are no restrictions for this procedure.  Your physician recommends that you schedule a follow-up appointment in: 3 months with Ileene Hutchinson

## 2013-04-01 NOTE — Assessment & Plan Note (Signed)
Recurrent VT with successful and appropirate ATP

## 2013-04-01 NOTE — Progress Notes (Signed)
Patient Care Team: Josetta Huddle, MD as PCP - General (Internal Medicine)   HPI  Stephanie Yates is a 67 y.o. female Seen in followup for ventricular tachycardia in the context of nonischemic cardiac myopathy congestive heart failure. As always identified in 6/14 when she presented with the arrhythmia treated with IV amiodarone. She received an ICD for secondary prevention.  She has no recollection of the events. She denies chest pain shortness of breath or peripheral edema  She's had no palpitations.  Past Medical History  Diagnosis Date  . Hypertension   . Hyperlipidemia   . Nonischemic cardiomyopathy     EF 10-15% on 11/2012 cath  . Chronic systolic CHF (congestive heart failure)   . History of tobacco abuse     Past Surgical History  Procedure Laterality Date  . Implantable cardioverter defibrillator implant  12/19/2012    St. Jude dual-chamber ICD, serial number [id number]    Current Outpatient Prescriptions  Medication Sig Dispense Refill  . aspirin 81 MG chewable tablet Chew 81 mg by mouth daily.      . carvedilol (COREG) 12.5 MG tablet Take 1 tablet (12.5 mg total) by mouth 2 (two) times daily with a meal.  60 tablet  3  . furosemide (LASIX) 40 MG tablet Take 40 mg by mouth daily.      Marland Kitchen lisinopril (PRINIVIL,ZESTRIL) 5 MG tablet Take 1 tablet (5 mg total) by mouth daily.  30 tablet  3  . simvastatin (ZOCOR) 20 MG tablet Take 1 tablet (20 mg total) by mouth every evening.  30 tablet  3  . spironolactone (ALDACTONE) 25 MG tablet Take 1/2 tab daily  90 tablet  3   No current facility-administered medications for this visit.    Allergies  Allergen Reactions  . Strawberry Hives and Itching    Review of Systems negative except from HPI and PMH  Physical Exam BP 142/79  Pulse 64  Ht 5' (1.524 m)  Wt 186 lb (84.369 kg)  BMI 36.33 kg/m2 Well developed and well nourished in no acute distress HENT normal E scleral and icterus clear Neck Supple JVP flat; carotids brisk  and full Device pocket well healed; without hematoma or erythema.  There is no tethering Clear to ausculation  regular rate and rhythm, no murmurs gallops or rub Soft with active bowel sounds No clubbing cyanosis none Edema Alert and oriented, grossly normal motor and sensory function Skin Warm and Dry  ECG demonstrates sinus rhythm with frequent ventricular ectopy with a right bundle superior axis morphology  Assessment and  Plan

## 2013-04-01 NOTE — Assessment & Plan Note (Signed)
NOT TOO bad

## 2013-04-01 NOTE — Assessment & Plan Note (Signed)
The patient is euvolemic. She is on multiple medications. We'll check a metabolic profile as well as Magnesium.

## 2013-04-01 NOTE — Assessment & Plan Note (Signed)
Patient noted to have 21% PVCs on interrogation of her device. This may be contributing to poor preventing improvement in LV systolic function. We will review echo. Will almost certainly need to begin antiarrhythmic therapy unless we find the culprit abnormality. Given the fact that there is also intercurrent atrial fibrillation be inclined to use amiodarone not withstanding her relatively young age

## 2013-04-01 NOTE — Assessment & Plan Note (Signed)
Identified on device  Longest duration 37 minutes  Will need anticoagulation

## 2013-04-01 NOTE — Assessment & Plan Note (Signed)
As above.

## 2013-04-02 LAB — BASIC METABOLIC PANEL
CO2: 26 mEq/L (ref 19–32)
Chloride: 102 mEq/L (ref 96–112)
Sodium: 137 mEq/L (ref 135–145)

## 2013-04-02 LAB — MAGNESIUM: Magnesium: 2 mg/dL (ref 1.5–2.5)

## 2013-04-09 ENCOUNTER — Other Ambulatory Visit: Payer: Self-pay

## 2013-04-09 MED ORDER — LISINOPRIL 5 MG PO TABS: 5.0000 mg | ORAL_TABLET | Freq: Every day | ORAL | Status: DC

## 2013-04-19 ENCOUNTER — Ambulatory Visit (HOSPITAL_COMMUNITY): Payer: Medicare PPO | Attending: Internal Medicine | Admitting: Cardiology

## 2013-04-19 DIAGNOSIS — I5022 Chronic systolic (congestive) heart failure: Secondary | ICD-10-CM | POA: Insufficient documentation

## 2013-04-19 DIAGNOSIS — I493 Ventricular premature depolarization: Secondary | ICD-10-CM

## 2013-04-19 DIAGNOSIS — I4891 Unspecified atrial fibrillation: Secondary | ICD-10-CM | POA: Insufficient documentation

## 2013-04-19 DIAGNOSIS — I428 Other cardiomyopathies: Secondary | ICD-10-CM | POA: Insufficient documentation

## 2013-04-19 NOTE — Progress Notes (Signed)
Echo performed. 

## 2013-04-23 ENCOUNTER — Telehealth: Payer: Self-pay | Admitting: Internal Medicine

## 2013-04-23 ENCOUNTER — Other Ambulatory Visit: Payer: Self-pay

## 2013-04-23 MED ORDER — CARVEDILOL 12.5 MG PO TABS: 12.5000 mg | ORAL_TABLET | Freq: Two times a day (BID) | ORAL | Status: DC

## 2013-04-23 NOTE — Telephone Encounter (Signed)
Explained to patient that I would call tomorrow with results. Dr. Caryl Comes is currently reviewing. Per her request, please call after 2pm because she has to work. She is agreeable to plan.

## 2013-04-23 NOTE — Telephone Encounter (Signed)
Follow up     Returning a nurses call to get test results

## 2013-04-23 NOTE — Telephone Encounter (Signed)
Follow Up:  Pt states she is calling back for her test results. Pt states she is at work and would like someone to call around 2:30 today or later.

## 2013-04-24 ENCOUNTER — Other Ambulatory Visit: Payer: Self-pay | Admitting: *Deleted

## 2013-04-24 DIAGNOSIS — I4891 Unspecified atrial fibrillation: Secondary | ICD-10-CM

## 2013-04-24 NOTE — Telephone Encounter (Signed)
Went over results/orders with patient. See result note.

## 2013-05-02 ENCOUNTER — Other Ambulatory Visit (INDEPENDENT_AMBULATORY_CARE_PROVIDER_SITE_OTHER): Payer: Medicare PPO

## 2013-05-02 ENCOUNTER — Ambulatory Visit (HOSPITAL_COMMUNITY)
Admission: RE | Admit: 2013-05-02 | Discharge: 2013-05-02 | Disposition: A | Discharge status: Home / Self Care | Payer: Medicare PPO | Source: Ambulatory Visit | Attending: Internal Medicine | Admitting: Internal Medicine

## 2013-05-02 DIAGNOSIS — I5022 Chronic systolic (congestive) heart failure: Secondary | ICD-10-CM | POA: Insufficient documentation

## 2013-05-02 DIAGNOSIS — I4891 Unspecified atrial fibrillation: Secondary | ICD-10-CM

## 2013-05-02 MED ORDER — ALBUTEROL SULFATE (5 MG/ML) 0.5% IN NEBU
2.5000 mg | INHALATION_SOLUTION | Freq: Once | RESPIRATORY_TRACT | Status: AC
  Administered 2013-05-02: 2.5 mg via RESPIRATORY_TRACT

## 2013-05-03 LAB — HEPATIC FUNCTION PANEL
ALT: 11 U/L (ref 0–35)
Total Protein: 7.6 g/dL (ref 6.0–8.3)

## 2013-05-03 LAB — TSH: TSH: 1.15 u[IU]/mL (ref 0.35–5.50)

## 2013-05-08 ENCOUNTER — Telehealth: Payer: Self-pay | Admitting: *Deleted

## 2013-05-08 NOTE — Telephone Encounter (Signed)
lmtcb - need to start Amiodarone per Dr. Caryl Comes. (Amiodarone 400 mg BID x 2 weeks; then 400 mg daily x 4 weeks; then 200 mg daily)

## 2013-05-09 MED ORDER — AMIODARONE HCL 200 MG PO TABS: ORAL_TABLET | ORAL | Status: DC

## 2013-05-09 NOTE — Telephone Encounter (Signed)
Follow Up:  Pt states she is returning Sherri's call. Please advise

## 2013-05-09 NOTE — Telephone Encounter (Signed)
Prescription called in for Amiodarone as per Dr. Olin Pia orders. Horton Chin RN

## 2013-05-09 NOTE — Telephone Encounter (Signed)
Pt is aware Dr. Olin Pia  recommendations regarding Amiodarone medication. Pt agreed and would like for the prescription to be send to [facility].

## 2013-05-10 ENCOUNTER — Telehealth: Payer: Self-pay | Admitting: Internal Medicine

## 2013-05-10 NOTE — Telephone Encounter (Signed)
New Problem:  Pt is asking for clarification on some of her meds. Pt would like the nurse to review her meds with her. Please advise

## 2013-05-10 NOTE — Telephone Encounter (Signed)
Patient wanted to review Amiodarone instructions once more to ensure she is taking medication correctly. Reviewed instructions, patient verbalized understanding of medication instructions. (See telephone note from 11/12 with complete medication details).

## 2013-06-04 ENCOUNTER — Telehealth: Payer: Self-pay | Admitting: Internal Medicine

## 2013-06-04 NOTE — Telephone Encounter (Signed)
Follow Up  3rd message  Pt following up//

## 2013-06-04 NOTE — Telephone Encounter (Signed)
New Problem:  Pt states she is calling to see if she can take Coridon... PT is requesting a call back.

## 2013-06-04 NOTE — Telephone Encounter (Signed)
Ok to take Coricidon without D (decongestant), per Dr. Caryl Comes. Patient agreeable to plan.

## 2013-06-04 NOTE — Telephone Encounter (Signed)
Late entry- spoke with pt this morning, explained I would discuss with Dr. Caryl Comes this afternoon and get back with her. Patient agreeable to plan.

## 2013-06-04 NOTE — Telephone Encounter (Signed)
Follow Up  Pt returned call// Please call back today

## 2013-07-02 ENCOUNTER — Encounter: Payer: Medicare PPO | Admitting: Cardiology

## 2013-07-12 ENCOUNTER — Encounter: Payer: Self-pay | Admitting: Internal Medicine

## 2013-08-06 ENCOUNTER — Encounter: Payer: Self-pay | Admitting: Internal Medicine

## 2013-08-06 ENCOUNTER — Encounter: Payer: Self-pay | Admitting: Cardiology

## 2013-08-06 ENCOUNTER — Ambulatory Visit (INDEPENDENT_AMBULATORY_CARE_PROVIDER_SITE_OTHER): Payer: Medicare PPO | Admitting: Cardiology

## 2013-08-06 VITALS — BP 126/107 | HR 62 | Ht 60.0 in | Wt 184.0 lb

## 2013-08-06 DIAGNOSIS — I428 Other cardiomyopathies: Secondary | ICD-10-CM

## 2013-08-06 DIAGNOSIS — I4729 Other ventricular tachycardia: Secondary | ICD-10-CM

## 2013-08-06 DIAGNOSIS — I472 Ventricular tachycardia, unspecified: Secondary | ICD-10-CM

## 2013-08-06 DIAGNOSIS — I4891 Unspecified atrial fibrillation: Secondary | ICD-10-CM

## 2013-08-06 DIAGNOSIS — Z95810 Presence of automatic (implantable) cardiac defibrillator: Secondary | ICD-10-CM

## 2013-08-06 DIAGNOSIS — I5022 Chronic systolic (congestive) heart failure: Secondary | ICD-10-CM

## 2013-08-06 LAB — MDC_IDC_ENUM_SESS_TYPE_INCLINIC
Brady Statistic RA Percent Paced: 9.9 %
Brady Statistic RV Percent Paced: 3.7 %
Date Time Interrogation Session: 20150210173723
HighPow Impedance: 78 Ohm
Implantable Pulse Generator Model: 2411
Implantable Pulse Generator Serial Number: 7093938
Lead Channel Impedance Value: 350 Ohm
Lead Channel Impedance Value: 540 Ohm
Lead Channel Pacing Threshold Amplitude: 0.75 V
Lead Channel Pacing Threshold Pulse Width: 0.5 ms
Lead Channel Pacing Threshold Pulse Width: 0.5 ms
Lead Channel Sensing Intrinsic Amplitude: 5 mV
Lead Channel Setting Pacing Amplitude: 1.125
Lead Channel Setting Pacing Amplitude: 2 V
Lead Channel Setting Pacing Pulse Width: 0.5 ms
Lead Channel Setting Sensing Sensitivity: 0.5 mV
MDC IDC MSMT BATTERY REMAINING LONGEVITY: 90 mo
MDC IDC MSMT LEADCHNL RV PACING THRESHOLD AMPLITUDE: 1 V
MDC IDC MSMT LEADCHNL RV SENSING INTR AMPL: 10.1 mV
MDC IDC SET ZONE DETECTION INTERVAL: 250 ms
MDC IDC SET ZONE DETECTION INTERVAL: 300 ms
MDC IDC SET ZONE DETECTION INTERVAL: 330 ms

## 2013-08-06 MED ORDER — APIXABAN 5 MG PO TABS: 5.0000 mg | ORAL_TABLET | Freq: Two times a day (BID) | ORAL | Status: DC

## 2013-08-06 NOTE — Progress Notes (Signed)
Patient ID: Stephanie Yates MRN: [id number], DOB/AGE: [birth year]   Date of Visit: 08/06/2013  Primary Physician: Josetta Huddle, MD Primary Cardiologist: Haroldine Laws, MD Primary EP: Jolyn Nap, MD Reason for Visit: EP/device follow-up  History of Present Illness  Stephanie Yates is a 68 y.o. female with a NICM, EF [id number], chronic systolic HF, normal coronary arteries and sustained VT s/p ICD implant who presents today for routine EP followup. At her last visit with Dr. Caryl Comes in Oct 2014, atrial fibrillation was diagnosed on device interrogation as well as intercurrent VT appropriately terminated with ATP.  Since last being seen in our clinic, she reports she is doing well and has no complaints. She denies chest pain or shortness of breath. She denies palpitations, dizziness, near syncope or syncope. She denies LE swelling, orthopnea, PND or recent weight gain. She denies ICD shocks. She is compliant and tolerating medications without difficulty.  Past Medical History Past Medical History  Diagnosis Date  . Hypertension   . Hyperlipidemia   . Nonischemic cardiomyopathy     EF 10-15% on 11/2012 cath  . Chronic systolic CHF (congestive heart failure)   . History of tobacco abuse   . Ventricular tachycardia 12/14/2012  . Sinus bradycardia 12/21/2012    Past Surgical History Past Surgical History  Procedure Laterality Date  . Implantable cardioverter defibrillator implant  12/19/2012    St. Jude dual-chamber ICD, serial number [id number]    Allergies/Intolerances Allergies  Allergen Reactions  . Strawberry Hives and Itching    Current Home Medications Current Outpatient Prescriptions  Medication Sig Dispense Refill  . amiodarone (PACERONE) 200 MG tablet 1 tablet daily      . carvedilol (COREG) 12.5 MG tablet Take 1 tablet (12.5 mg total) by mouth 2 (two) times daily with a meal.  60 tablet  3  . Cholecalciferol (VITAMIN D-3 PO) Take by mouth daily.      . furosemide (LASIX) 40 MG  tablet Take 40 mg by mouth daily.      Marland Kitchen lisinopril (PRINIVIL,ZESTRIL) 5 MG tablet Take 1 tablet (5 mg total) by mouth daily.  30 tablet  3  . simvastatin (ZOCOR) 20 MG tablet Take 1 tablet (20 mg total) by mouth every evening.  30 tablet  3  . spironolactone (ALDACTONE) 25 MG tablet Take 1/2 tab daily  90 tablet  3  . apixaban (ELIQUIS) 5 MG TABS tablet Take 1 tablet (5 mg total) by mouth 2 (two) times daily.  60 tablet  2   No current facility-administered medications for this visit.    Social History History   Social History  . Marital Status: Legally Separated    Spouse Name: N/A    Number of Children: N/A  . Years of Education: N/A   Occupational History  . Not on file.   Social History Main Topics  . Smoking status: Former Research scientist (life sciences)  . Smokeless tobacco: Not on file  . Alcohol Use: No  . Drug Use: No  . Sexual Activity: Not on file   Other Topics Concern  . Not on file   Social History Narrative  . No narrative on file     Review of Systems General: No chills, fever, night sweats or weight changes Cardiovascular: No chest pain, dyspnea on exertion, edema, orthopnea, palpitations, paroxysmal nocturnal dyspnea Dermatological: No rash, lesions or masses Respiratory: No cough, dyspnea Urologic: No hematuria, dysuria Abdominal: No nausea, vomiting, diarrhea, bright red blood per rectum, melena, or hematemesis Neurologic: No visual changes, weakness,  changes in mental status All other systems reviewed and are otherwise negative except as noted above.  Physical Exam Vitals: Blood pressure 126/107, pulse 62, height 5' (1.524 m), weight 184 lb (83.462 kg).  General: Well developed, well appearing 68 y.o. female in no acute distress. HEENT: Normocephalic, atraumatic. EOMs intact. Sclera nonicteric. Oropharynx clear.  Neck: Supple. No JVD. Lungs: Respirations regular and unlabored, CTA bilaterally. No wheezes, rales or rhonchi. Heart: RRR. S1, S2 present. No murmurs, rub,  S3 or S4. Abdomen: Soft, non-distended.  Extremities: No clubbing, cyanosis or edema. PT/Radials 2+ and equal bilaterally. Psych: Normal affect. Neuro: Alert and oriented X 3. Moves all extremities spontaneously. Skin: ICD implant site intact and well healed.    Diagnostics Device interrogation today - Normal device function. Thresholds and sensing consistent with previous device measurements. Impedance trends stable over time. 4 VT-1 episodes and 1 VT-2 episode recorded on 05/10/2013 between 8:30 pm - 9:00 pm, EGMs reviewed and consistent with monomorphic VT with cycle length 300-325 msec. All episodes terminated with ATP x 1. No symptoms reported by patient. Now on amiodarone. 22 mode switch episodes, longest 4 hours 26 minutes, EGMs consistent with AFib. Will start Eliquis. Histogram distribution appropriate for patient and level of activity. No programming changes made this session. Device programmed at appropriate safety margins. Device programmed to optimize intrinsic conduction. Estimated longevity 6.9 - 7.4 years.   Assessment and Plan  1. Paroxysmal VT - received appropriate ATP therapy which successfully terminated VT back in Nov 2014 - now on amiodarone and will continue 200 mg once daily in addition to BB - enroll for amiodarone surveillance labs (TSH, LFTs) and PFTs - no driving for 6 months and Ms. Musch agrees to comply stating she doesn't drive anyway   - advised remote ICD follow-up but Ms. Hensen declines at this time - return for follow-up with Dr. Caryl Comes in 3 months  2. Paroxysmal atrial fibrillation - continue BB for rate control - CHADS-VASc score is 4 so will need anticoagulation - check BMET - if renal function stable, will start Eliquis 5 mg twice daily and stop aspirin; counseled Ms. Stanly regarding risks and benefits of anticoagulation; she was instructed to notify us should she develop signs of bleeding - return for follow-up with Elberta Leatherwood, PharmD in  anticoagulation clinic in one month - as above, return for follow-up with Dr. Caryl Comes in 3 months  3. NICM with chronic systolic HF s/p ICD implant - normal device function - no programming changes made - OptiVol stable - continue medical therapy with Coreg and lisinopril - advised remote ICD follow-up but Ms. Strom declines at this time - as above, return for follow-up with Dr. Caryl Comes in 3 months  Signed, Ileene Hutchinson, PA-C 08/06/2013, 5:38 PM

## 2013-08-06 NOTE — Patient Instructions (Addendum)
Your physician has recommended you make the following change in your medication: after lab work comes back  1. Stop Asprin 2. Start Eliquis 5mg  twice daily   Your physician recommends that you have lab work today: Bmet   Your physician recommends that you schedule a follow-up appointment in: 4 weeks with [place] physician recommends that you schedule a follow-up appointment in: 3 months with DR. Lequita Halt

## 2013-08-07 LAB — BASIC METABOLIC PANEL
BUN: 44 mg/dL — ABNORMAL HIGH (ref 6–23)
CO2: 26 mEq/L (ref 19–32)
Calcium: 8.9 mg/dL (ref 8.4–10.5)
Chloride: 105 mEq/L (ref 96–112)
Creatinine, Ser: 2.1 mg/dL — ABNORMAL HIGH (ref 0.4–1.2)
GFR: 30.3 mL/min — ABNORMAL LOW (ref >60.00)
Glucose, Bld: 78 mg/dL (ref 70–99)
Potassium: 4.9 mEq/L (ref 3.5–5.1)
Sodium: 140 mEq/L (ref 135–145)

## 2013-08-08 ENCOUNTER — Other Ambulatory Visit: Payer: Self-pay | Admitting: *Deleted

## 2013-08-08 DIAGNOSIS — R899 Unspecified abnormal finding in specimens from other organs, systems and tissues: Secondary | ICD-10-CM

## 2013-08-16 ENCOUNTER — Other Ambulatory Visit (INDEPENDENT_AMBULATORY_CARE_PROVIDER_SITE_OTHER): Payer: Medicare PPO

## 2013-08-16 DIAGNOSIS — R6889 Other general symptoms and signs: Secondary | ICD-10-CM

## 2013-08-16 DIAGNOSIS — R899 Unspecified abnormal finding in specimens from other organs, systems and tissues: Secondary | ICD-10-CM

## 2013-08-16 LAB — BASIC METABOLIC PANEL
BUN: 32 mg/dL — AB (ref 6–23)
CALCIUM: 9.1 mg/dL (ref 8.4–10.5)
CO2: 26 meq/L (ref 19–32)
Chloride: 102 mEq/L (ref 96–112)
Creatinine, Ser: 1.8 mg/dL — ABNORMAL HIGH (ref 0.4–1.2)
GFR: 35.13 mL/min — AB (ref >60.00)
GLUCOSE: 87 mg/dL (ref 70–99)
Potassium: 4.6 mEq/L (ref 3.5–5.1)
Sodium: 136 mEq/L (ref 135–145)

## 2013-08-22 ENCOUNTER — Other Ambulatory Visit: Payer: Self-pay | Admitting: Internal Medicine

## 2013-08-26 ENCOUNTER — Other Ambulatory Visit: Payer: Self-pay | Admitting: Internal Medicine

## 2013-08-27 ENCOUNTER — Other Ambulatory Visit: Payer: Self-pay

## 2013-08-27 MED ORDER — CARVEDILOL 12.5 MG PO TABS: 12.5000 mg | ORAL_TABLET | Freq: Two times a day (BID) | ORAL | Status: DC

## 2013-09-02 ENCOUNTER — Ambulatory Visit: Payer: Medicare PPO | Admitting: Pharmacist

## 2013-09-02 ENCOUNTER — Ambulatory Visit (INDEPENDENT_AMBULATORY_CARE_PROVIDER_SITE_OTHER): Payer: Medicare PPO | Admitting: *Deleted

## 2013-09-02 DIAGNOSIS — I4891 Unspecified atrial fibrillation: Secondary | ICD-10-CM

## 2013-09-02 NOTE — Patient Instructions (Signed)
Pt was started on Eliquis for Atrial Fibrillation on 08/06/13.     A full discussion of the nature of anticoagulants has been carried out.  A benefit/risk analysis has been presented to the patient, so that they understand the justification for choosing anticoagulation with Eliquis at this time.  The need for compliance is stressed.  Pt is aware to take the medication twice daily.  Side effects of potential bleeding are discussed, including unusual colored urine or stools, coughing up blood or coffee ground emesis, nose bleeds or serious fall or head trauma.  Discussed signs and symptoms of stroke. The patient should avoid any OTC items containing aspirin or ibuprofen.  Avoid alcohol consumption.   Call if any signs of abnormal bleeding.  Discussed financial obligations and resolved any difficulty in obtaining medication.  Next lab test test in _____ months.

## 2013-09-03 ENCOUNTER — Ambulatory Visit: Payer: Medicare PPO | Admitting: Pharmacist

## 2013-09-03 LAB — BASIC METABOLIC PANEL
BUN: 29 mg/dL — AB (ref 6–23)
CHLORIDE: 105 meq/L (ref 96–112)
CO2: 27 mEq/L (ref 19–32)
CREATININE: 2 mg/dL — AB (ref 0.4–1.2)
Calcium: 9.3 mg/dL (ref 8.4–10.5)
GFR: 32.64 mL/min — AB (ref >60.00)
Glucose, Bld: 92 mg/dL (ref 70–99)
Potassium: 4.9 mEq/L (ref 3.5–5.1)
Sodium: 138 mEq/L (ref 135–145)

## 2013-09-03 LAB — CBC
HCT: 34 % — ABNORMAL LOW (ref 36.0–46.0)
HEMOGLOBIN: 11.1 g/dL — AB (ref 12.0–15.0)
MCHC: 32.5 g/dL (ref 30.0–36.0)
MCV: 83 fl (ref 78.0–100.0)
PLATELETS: 410 10*3/uL — AB (ref 150.0–400.0)
RBC: 4.1 Mil/uL (ref 3.87–5.11)
RDW: 14.3 % (ref 11.5–14.6)
WBC: 7.9 10*3/uL (ref 4.5–10.5)

## 2013-09-03 NOTE — Progress Notes (Signed)
Pt was started on Eliquis for Afib on 08/06/13.    Reviewed patients medication list.  Pt is not currently on any combined P-gp and strong CYP3A4 inhibitors/inducers (ketoconazole, traconazole, ritonavir, carbamazepine, phenytoin, rifampin, St. John's wort).  Reviewed labs.  SCr 2.0, Weight 83.4 Kg .  Dose appropriate based on age and weight.  Hgb and HCT are 11.1/34.0   A full discussion of the nature of anticoagulants has been carried out.  A benefit/risk analysis has been presented to the patient, so that they understand the justification for choosing anticoagulation with Eliquis at this time.  The need for compliance is stressed.  Pt is aware to take the medication twice daily.  Side effects of potential bleeding are discussed, including unusual colored urine or stools, coughing up blood or coffee ground emesis, nose bleeds or serious fall or head trauma.  Discussed signs and symptoms of stroke. The patient should avoid any OTC items containing aspirin or ibuprofen.  Avoid alcohol consumption.   Call if any signs of abnormal bleeding.  Discussed financial obligations and resolved any difficulty in obtaining medication.  Next lab test test in 6 months on 03/06/14.

## 2013-09-05 ENCOUNTER — Telehealth: Payer: Self-pay

## 2013-09-05 NOTE — Telephone Encounter (Signed)
pt given lab results.Stable Hgb and kiney function. No change.pt verbalized understanding.

## 2013-09-05 NOTE — Telephone Encounter (Signed)
Message copied by Lamar Laundry on Thu Sep 05, 2013  2:25 PM ------      Message from: Daneen Schick      Created: Thu Sep 05, 2013 11:45 AM       Stable Hgb and kiney function. No change ------

## 2013-09-06 ENCOUNTER — Other Ambulatory Visit: Payer: Self-pay | Admitting: Internal Medicine

## 2013-09-06 DIAGNOSIS — R109 Unspecified abdominal pain: Secondary | ICD-10-CM

## 2013-09-09 ENCOUNTER — Ambulatory Visit
Admission: RE | Admit: 2013-09-09 | Discharge: 2013-09-09 | Disposition: A | Discharge status: Home / Self Care | Payer: Medicare PPO | Source: Ambulatory Visit | Attending: Internal Medicine | Admitting: Internal Medicine

## 2013-09-09 DIAGNOSIS — R109 Unspecified abdominal pain: Secondary | ICD-10-CM

## 2013-09-30 ENCOUNTER — Other Ambulatory Visit: Payer: Self-pay | Admitting: Internal Medicine

## 2013-09-30 ENCOUNTER — Ambulatory Visit
Admission: RE | Admit: 2013-09-30 | Discharge: 2013-09-30 | Disposition: A | Discharge status: Home / Self Care | Payer: Medicare PPO | Source: Ambulatory Visit | Attending: Internal Medicine | Admitting: Internal Medicine

## 2013-09-30 DIAGNOSIS — R61 Generalized hyperhidrosis: Secondary | ICD-10-CM

## 2013-10-10 ENCOUNTER — Other Ambulatory Visit: Payer: Self-pay | Admitting: Gastroenterology

## 2013-11-19 ENCOUNTER — Other Ambulatory Visit: Payer: Self-pay | Admitting: Cardiology

## 2013-11-21 ENCOUNTER — Encounter: Payer: Self-pay | Admitting: Internal Medicine

## 2013-11-21 ENCOUNTER — Ambulatory Visit (INDEPENDENT_AMBULATORY_CARE_PROVIDER_SITE_OTHER): Payer: Medicare PPO | Admitting: Internal Medicine

## 2013-11-21 VITALS — BP 139/71 | HR 62 | Ht 62.0 in | Wt 168.0 lb

## 2013-11-21 DIAGNOSIS — I4891 Unspecified atrial fibrillation: Secondary | ICD-10-CM

## 2013-11-21 DIAGNOSIS — I472 Ventricular tachycardia: Secondary | ICD-10-CM

## 2013-11-21 DIAGNOSIS — I5022 Chronic systolic (congestive) heart failure: Secondary | ICD-10-CM

## 2013-11-21 DIAGNOSIS — Z95810 Presence of automatic (implantable) cardiac defibrillator: Secondary | ICD-10-CM

## 2013-11-21 DIAGNOSIS — I428 Other cardiomyopathies: Secondary | ICD-10-CM

## 2013-11-21 DIAGNOSIS — I4729 Other ventricular tachycardia: Secondary | ICD-10-CM

## 2013-11-21 LAB — COMPREHENSIVE METABOLIC PANEL
ALBUMIN: 3.7 g/dL (ref 3.5–5.2)
ALK PHOS: 95 U/L (ref 39–117)
ALT: 30 U/L (ref 0–35)
AST: 36 U/L (ref 0–37)
BUN: 35 mg/dL — AB (ref 6–23)
CHLORIDE: 104 meq/L (ref 96–112)
CO2: 22 mEq/L (ref 19–32)
CREATININE: 2.06 mg/dL — AB (ref 0.50–1.10)
Calcium: 9.8 mg/dL (ref 8.4–10.5)
Glucose, Bld: 89 mg/dL (ref 70–99)
POTASSIUM: 4.3 meq/L (ref 3.5–5.3)
Sodium: 133 mEq/L — ABNORMAL LOW (ref 135–145)
Total Bilirubin: 0.7 mg/dL (ref 0.2–1.2)
Total Protein: 6.9 g/dL (ref 6.0–8.3)

## 2013-11-21 LAB — MDC_IDC_ENUM_SESS_TYPE_INCLINIC
Battery Remaining Longevity: 88.8 mo
Date Time Interrogation Session: 20150528171208
HIGH POWER IMPEDANCE MEASURED VALUE: 68.625
HighPow Impedance: 69 Ohm
Implantable Pulse Generator Model: 2411
Implantable Pulse Generator Serial Number: 7093938
Lead Channel Impedance Value: 512.5 Ohm
Lead Channel Pacing Threshold Amplitude: 1 V
Lead Channel Pacing Threshold Amplitude: 1 V
Lead Channel Pacing Threshold Pulse Width: 0.5 ms
Lead Channel Pacing Threshold Pulse Width: 0.5 ms
Lead Channel Pacing Threshold Pulse Width: 0.5 ms
Lead Channel Sensing Intrinsic Amplitude: 4.8 mV
Lead Channel Setting Sensing Sensitivity: 0.5 mV
MDC IDC MSMT LEADCHNL RA PACING THRESHOLD AMPLITUDE: 1 V
MDC IDC MSMT LEADCHNL RV IMPEDANCE VALUE: 350 Ohm
MDC IDC MSMT LEADCHNL RV SENSING INTR AMPL: 9.8 mV
MDC IDC SET LEADCHNL RA PACING AMPLITUDE: 2 V
MDC IDC SET LEADCHNL RV PACING AMPLITUDE: 1.25 V
MDC IDC SET LEADCHNL RV PACING PULSEWIDTH: 0.5 ms
MDC IDC SET ZONE DETECTION INTERVAL: 250 ms
MDC IDC STAT BRADY RA PERCENT PACED: 12 %
MDC IDC STAT BRADY RV PERCENT PACED: 4.7 %
Zone Setting Detection Interval: 300 ms
Zone Setting Detection Interval: 330 ms

## 2013-11-21 LAB — TSH: TSH: 2.285 u[IU]/mL (ref 0.350–4.500)

## 2013-11-21 NOTE — Patient Instructions (Addendum)
Labs today: TSH, CMET  Your physician recommends that you schedule a follow-up appointment in: 3 months with device clinic.  Your physician wants you to follow-up in: 6 months with Dr. Caryl Comes.  You will receive a reminder letter in the mail two months in advance. If you don't receive a letter, please call our office to schedule the follow-up appointment.

## 2013-11-21 NOTE — Progress Notes (Addendum)
      Patient Care Team: Josetta Huddle, MD as PCP - General (Internal Medicine)   HPI  Stephanie Yates is a 68 y.o. female Seen in followup for ventricular tachycardia in the context of nonischemic cardiac myopathy congestive heart failure. As always identified in 6/14 when she presented with the arrhythmia treated with IV amiodarone. She received an ICD for secondary prevention.    She is subsequently found to have paroxysmal atrial fibrillation and started on apixaban.  She's tolerating medications well..  Potassium of 4.9 3/15  No significant symptoms. She denies chest pain or shortness of breath. She is tolerating her medications.     Past Medical History  Diagnosis Date  . Hypertension   . Hyperlipidemia   . Nonischemic cardiomyopathy     EF 10-15% on 11/2012 cath  . Chronic systolic CHF (congestive heart failure)   . History of tobacco abuse   . Ventricular tachycardia 12/14/2012  . Sinus bradycardia 12/21/2012    Past Surgical History  Procedure Laterality Date  . Implantable cardioverter defibrillator implant  12/19/2012    St. Jude dual-chamber ICD, serial number [id number]    Current Outpatient Prescriptions  Medication Sig Dispense Refill  . amiodarone (PACERONE) 200 MG tablet 1 tablet daily      . carvedilol (COREG) 12.5 MG tablet Take 1 tablet (12.5 mg total) by mouth 2 (two) times daily with a meal.  60 tablet  3  . Cholecalciferol (VITAMIN D-3 PO) Take by mouth daily.      Marland Kitchen ELIQUIS 5 MG TABS tablet TAKE ONE TABLET BY MOUTH TWICE DAILY  60 tablet  0  . furosemide (LASIX) 40 MG tablet Take 40 mg by mouth daily.      . simvastatin (ZOCOR) 20 MG tablet Take 1 tablet (20 mg total) by mouth every evening.  30 tablet  3  . spironolactone (ALDACTONE) 25 MG tablet Take 1/2 tab daily  90 tablet  3   No current facility-administered medications for this visit.    Allergies  Allergen Reactions  . Strawberry Hives and Itching    Review of Systems negative except  from HPI and PMH  Physical Exam BP 139/71  Pulse 62  Ht 5\' 2"  (1.575 m)  Wt 168 lb (76.204 kg)  BMI 30.72 kg/m2 Well developed and well nourished in no acute distress HENT normal E scleral and icterus clear Neck Supple JVP flat; carotids brisk and full Clear to ausculation Device pocket well healed; without hematoma or erythema.  There is no tetheringRegular rate and rhythm, no murmurs gallops or rub Soft with active bowel sounds No clubbing cyanosis  Edema Alert and oriented, grossly normal motor and sensory function Skin Warm and Dry  ECG demonstrates sinus rhythm at 62 Intervals 21/12/43 Axis is left -42 Prior inferolateral MI  Assessment and  Plan  Ventricular tachycardia No intercurrent Ventricular tachycardia  Nonischemic cardio myopathy continue current medications. We will check a potassium level today on the Aldactone. Her lisinopril has been discontinued.  Implantable defibrillator-St. Jude The patient's device was interrogated.  The information was reviewed. No changes were made in the programming.      atrial fibrillation   Renal Insufficiency Grade 3  Congestive heart failure-chronic-systolic We'll continue her on apixaban. We'll continue other medications. We'll need to review the as to why she is not on lisinopril. We'll see her in 6 months

## 2013-11-24 ENCOUNTER — Encounter: Payer: Self-pay | Admitting: Internal Medicine

## 2013-11-25 ENCOUNTER — Encounter: Payer: Self-pay | Admitting: Internal Medicine

## 2013-12-23 ENCOUNTER — Other Ambulatory Visit: Payer: Self-pay | Admitting: Internal Medicine

## 2014-01-07 ENCOUNTER — Other Ambulatory Visit: Payer: Self-pay | Admitting: Internal Medicine

## 2014-01-28 ENCOUNTER — Other Ambulatory Visit: Payer: Self-pay | Admitting: Internal Medicine

## 2014-01-28 DIAGNOSIS — Z1231 Encounter for screening mammogram for malignant neoplasm of breast: Secondary | ICD-10-CM

## 2014-02-11 ENCOUNTER — Ambulatory Visit: Payer: Medicare PPO

## 2014-02-24 ENCOUNTER — Ambulatory Visit (INDEPENDENT_AMBULATORY_CARE_PROVIDER_SITE_OTHER): Payer: Medicare PPO | Admitting: *Deleted

## 2014-02-24 DIAGNOSIS — I428 Other cardiomyopathies: Secondary | ICD-10-CM

## 2014-02-24 DIAGNOSIS — I5022 Chronic systolic (congestive) heart failure: Secondary | ICD-10-CM

## 2014-02-24 LAB — MDC_IDC_ENUM_SESS_TYPE_INCLINIC
Battery Remaining Longevity: 86.4 mo
Brady Statistic RA Percent Paced: 16 %
Brady Statistic RV Percent Paced: 4.1 %
HighPow Impedance: 69.75 Ohm
Implantable Pulse Generator Model: 2411
Implantable Pulse Generator Serial Number: 7093938
Lead Channel Impedance Value: 450 Ohm
Lead Channel Impedance Value: 450 Ohm
Lead Channel Pacing Threshold Amplitude: 0.75 V
Lead Channel Pacing Threshold Amplitude: 0.75 V
Lead Channel Pacing Threshold Pulse Width: 0.5 ms
Lead Channel Sensing Intrinsic Amplitude: 5 mV
Lead Channel Setting Pacing Amplitude: 2 V
Lead Channel Setting Sensing Sensitivity: 0.5 mV
MDC IDC MSMT LEADCHNL RA PACING THRESHOLD PULSEWIDTH: 0.5 ms
MDC IDC MSMT LEADCHNL RA PACING THRESHOLD PULSEWIDTH: 0.5 ms
MDC IDC MSMT LEADCHNL RV PACING THRESHOLD AMPLITUDE: 1.5 V
MDC IDC MSMT LEADCHNL RV SENSING INTR AMPL: 10.5 mV
MDC IDC SESS DTM: 20150831163824
MDC IDC SET LEADCHNL RV PACING AMPLITUDE: 1.75 V
MDC IDC SET LEADCHNL RV PACING PULSEWIDTH: 0.5 ms
MDC IDC SET ZONE DETECTION INTERVAL: 300 ms
Zone Setting Detection Interval: 250 ms
Zone Setting Detection Interval: 330 ms

## 2014-02-24 NOTE — Progress Notes (Signed)
ICD check in clinic. Normal device function. Thresholds and sensing consistent with previous device measurements. Impedance trends stable over time. No evidence of any ventricular arrhythmias. 1 mode switche 21 minutes, + eliquis. Histogram distribution appropriate for patient and level of activity.  CorVue abnormal 6/2-6/26.   No changes made this session. Device programmed at appropriate safety margins. Device programmed to optimize intrinsic conduction. Estimated longevity 6.4 years.  Patient education completed including shock plan. Alert tones/vibration demonstrated for patient.  ROV in November with Dr. Caryl Comes.

## 2014-03-06 ENCOUNTER — Ambulatory Visit (INDEPENDENT_AMBULATORY_CARE_PROVIDER_SITE_OTHER): Payer: Medicare PPO | Admitting: Pharmacist

## 2014-03-06 ENCOUNTER — Encounter: Payer: Self-pay | Admitting: Internal Medicine

## 2014-03-06 VITALS — Wt 164.0 lb

## 2014-03-06 DIAGNOSIS — Z79899 Other long term (current) drug therapy: Secondary | ICD-10-CM

## 2014-03-06 DIAGNOSIS — I4891 Unspecified atrial fibrillation: Secondary | ICD-10-CM

## 2014-03-06 NOTE — Progress Notes (Signed)
Pt was started on Eliquis 5 mg bid for Afib on 08/06/13.    Reviewed patients medication list.  Pt is not currently on any combined P-gp and strong CYP3A4 inhibitors/inducers (ketoconazole, traconazole, ritonavir, carbamazepine, phenytoin, rifampin, St. John's wort).  Reviewed labs.  SCr 2.3 mg/dL, Weight 164 lbs, CrCl- 26 ml/min.  Dose needs to be reduced to 2.5 mg bid given CrCl and also on amiodarone.   Hgb and HCT Within Normal Limits  Cost of Eliquis has not been an issue for her.  She is tolerating Eliquis well without complaint, and denies any issues with bleeding or compliance. Patient notified to reduce Eliquis to 2.5 mg bid and to come back to see [place] in 3 months for evaluation given Scr trending up and Eliquis dose change.  She shows verbal understanding and agrees to call back if any questions.    A full discussion of the nature of anticoagulants has been carried out.  A benefit/risk analysis has been presented to the patient, so that they understand the justification for choosing anticoagulation with Eliquis at this time.  The need for compliance is stressed.  Pt is aware to take the medication twice daily.  Side effects of potential bleeding are discussed, including unusual colored urine or stools, coughing up blood or coffee ground emesis, nose bleeds or serious fall or head trauma.  Discussed signs and symptoms of stroke. The patient should avoid any OTC items containing aspirin or ibuprofen.  Avoid alcohol consumption.   Call if any signs of abnormal bleeding.  Discussed financial obligations and resolved any difficulty in obtaining medication.  Next lab test test in 3 months.

## 2014-03-06 NOTE — Patient Instructions (Signed)

## 2014-03-07 LAB — CBC
HCT: 34.9 % — ABNORMAL LOW (ref 36.0–46.0)
HEMOGLOBIN: 11.4 g/dL — AB (ref 12.0–15.0)
MCHC: 32.7 g/dL (ref 30.0–36.0)
MCV: 77.7 fl — ABNORMAL LOW (ref 78.0–100.0)
PLATELETS: 286 10*3/uL (ref 150.0–400.0)
RBC: 4.49 Mil/uL (ref 3.87–5.11)
RDW: 17.1 % — ABNORMAL HIGH (ref 11.5–15.5)
WBC: 5.9 10*3/uL (ref 4.0–10.5)

## 2014-03-07 LAB — BASIC METABOLIC PANEL
BUN: 27 mg/dL — ABNORMAL HIGH (ref 6–23)
CO2: 26 mEq/L (ref 19–32)
Calcium: 9.7 mg/dL (ref 8.4–10.5)
Chloride: 105 mEq/L (ref 96–112)
Creatinine, Ser: 2.3 mg/dL — ABNORMAL HIGH (ref 0.4–1.2)
GFR: 27.08 mL/min — AB (ref >60.00)
GLUCOSE: 98 mg/dL (ref 70–99)
POTASSIUM: 4.4 meq/L (ref 3.5–5.1)
Sodium: 138 mEq/L (ref 135–145)

## 2014-03-07 MED ORDER — APIXABAN 2.5 MG PO TABS: 2.5000 mg | ORAL_TABLET | Freq: Two times a day (BID) | ORAL | Status: DC

## 2014-03-12 ENCOUNTER — Other Ambulatory Visit: Payer: Self-pay | Admitting: *Deleted

## 2014-03-12 MED ORDER — CARVEDILOL 25 MG PO TABS: 12.5000 mg | ORAL_TABLET | Freq: Two times a day (BID) | ORAL | Status: DC

## 2014-04-21 ENCOUNTER — Other Ambulatory Visit: Payer: Self-pay | Admitting: Internal Medicine

## 2014-05-18 ENCOUNTER — Other Ambulatory Visit: Payer: Self-pay | Admitting: Internal Medicine

## 2014-05-20 ENCOUNTER — Ambulatory Visit (INDEPENDENT_AMBULATORY_CARE_PROVIDER_SITE_OTHER): Payer: Medicare PPO | Admitting: Internal Medicine

## 2014-05-20 ENCOUNTER — Encounter: Payer: Self-pay | Admitting: Internal Medicine

## 2014-05-20 VITALS — BP 152/68 | HR 73 | Ht 62.0 in | Wt 161.4 lb

## 2014-05-20 DIAGNOSIS — I472 Ventricular tachycardia: Secondary | ICD-10-CM

## 2014-05-20 DIAGNOSIS — N184 Chronic kidney disease, stage 4 (severe): Secondary | ICD-10-CM

## 2014-05-20 DIAGNOSIS — I429 Cardiomyopathy, unspecified: Secondary | ICD-10-CM

## 2014-05-20 DIAGNOSIS — I428 Other cardiomyopathies: Secondary | ICD-10-CM

## 2014-05-20 DIAGNOSIS — I4729 Other ventricular tachycardia: Secondary | ICD-10-CM

## 2014-05-20 DIAGNOSIS — I480 Paroxysmal atrial fibrillation: Secondary | ICD-10-CM

## 2014-05-20 DIAGNOSIS — Z4502 Encounter for adjustment and management of automatic implantable cardiac defibrillator: Secondary | ICD-10-CM

## 2014-05-20 LAB — MDC_IDC_ENUM_SESS_TYPE_INCLINIC
Battery Remaining Longevity: 85.2 mo
Brady Statistic RA Percent Paced: 11 %
Brady Statistic RV Percent Paced: 3.1 %
Date Time Interrogation Session: 20151124153909
HighPow Impedance: 81 Ohm
Implantable Pulse Generator Model: 2411
Implantable Pulse Generator Serial Number: 7093938
Lead Channel Impedance Value: 562.5 Ohm
Lead Channel Impedance Value: 587.5 Ohm
Lead Channel Pacing Threshold Amplitude: 0.75 V
Lead Channel Pacing Threshold Amplitude: 1.5 V
Lead Channel Pacing Threshold Pulse Width: 0.5 ms
Lead Channel Pacing Threshold Pulse Width: 0.5 ms
Lead Channel Sensing Intrinsic Amplitude: 11.8 mV
Lead Channel Sensing Intrinsic Amplitude: 5 mV
Lead Channel Setting Pacing Amplitude: 2 V
Lead Channel Setting Pacing Amplitude: 3 V
Lead Channel Setting Pacing Pulse Width: 0.5 ms
Lead Channel Setting Sensing Sensitivity: 0.5 mV
Zone Setting Detection Interval: 250 ms
Zone Setting Detection Interval: 300 ms
Zone Setting Detection Interval: 330 ms

## 2014-05-20 MED ORDER — CARVEDILOL 25 MG PO TABS: 12.5000 mg | ORAL_TABLET | Freq: Two times a day (BID) | ORAL | Status: DC

## 2014-05-20 NOTE — Patient Instructions (Signed)
Your physician has recommended you make the following change in your medication:  1) DECREASE Eliquis to 2.5 mg twice a day 2) STOP Aldactone  Labs today: CMET, TSH  Your physician recommends that you schedule a follow-up appointment in: 3 months with device clinic.  Your physician wants you to follow-up in: 1 year with Dr. Caryl Comes.  You will receive a reminder letter in the mail two months in advance. If you don't receive a letter, please call our office to schedule the follow-up appointment.

## 2014-05-20 NOTE — Progress Notes (Signed)
Patient Care Team: Josetta Huddle, MD as PCP - General (Internal Medicine)   HPI  Stephanie Yates is a 68 y.o. female Seen in followup for ventricular tachycardia in the context of nonischemic cardiac myopathy congestive heart failure. As always identified in 6/14 when she presented she treated with IV amiodarone. She received an ICD for secondary prevention.    She is subsequently found to have paroxysmal atrial fibrillation and started on apixaban.  She's tolerating medications well..  Potassium of 4.9 3/15  No significant symptoms. She denies chest pain or shortness of breath. She is tolerating her medications.     Past Medical History  Diagnosis Date  . Hypertension   . Hyperlipidemia   . Nonischemic cardiomyopathy     EF 10-15% on 11/2012 cath  . Chronic systolic CHF (congestive heart failure)   . History of tobacco abuse   . Ventricular tachycardia 12/14/2012  . Sinus bradycardia 12/21/2012  . Chronic renal insufficiency, stage III (moderate)     Cr 2.0 5/15    Past Surgical History  Procedure Laterality Date  . Implantable cardioverter defibrillator implant  12/19/2012    St. Jude dual-chamber ICD, serial number [id number]    Current Outpatient Prescriptions  Medication Sig Dispense Refill  . amiodarone (PACERONE) 200 MG tablet Take 1 tablet (200 mg total) by mouth daily. 30 tablet 1  . carvedilol (COREG) 25 MG tablet TAKE ONE-HALF TABLET BY MOUTH TWICE DAILY 30 tablet 6  . Cholecalciferol (VITAMIN D-3 PO) Take by mouth daily.    Marland Kitchen ELIQUIS 5 MG TABS tablet Take 5 mg by mouth daily.    . furosemide (LASIX) 40 MG tablet Take 40 mg by mouth daily.    . pantoprazole (PROTONIX) 40 MG tablet Take 40 mg by mouth daily.    . simvastatin (ZOCOR) 20 MG tablet TAKE ONE TABLET BY MOUTH IN THE EVENING 30 tablet 3  . spironolactone (ALDACTONE) 25 MG tablet Take 1/2 tab daily 90 tablet 3   No current facility-administered medications for this visit.    Allergies    Allergen Reactions  . Strawberry Hives and Itching    Hives and itching     Review of Systems negative except from HPI and PMH  Physical Exam BP 152/68 mmHg  Pulse 73  Ht 5\' 2"  (1.575 m)  Wt 73.211 kg (161 lb 6.4 oz)  BMI 29.51 kg/m2 Well developed and well nourished in no acute distress HENT normal E scleral and icterus clear Neck Supple JVP flat; carotids brisk and full Clear to ausculation Device pocket well healed; without hematoma or erythema.  There is no tetheringRegular rate and rhythm, no murmurs gallops or rub Soft with active bowel sounds No clubbing cyanosis  Edema Alert and oriented, grossly normal motor and sensory function Skin Warm and Dry  ECG demonstrates sinus rhythm at 62 Intervals 21/12/43 Axis is left -42 Prior inferolateral MI  Assessment and  Plan  Ventricular tachycardia No intercurrent Ventricular tachycardia  Nonischemic cardio myopathy continue current medications. We will check a potassium level today on the Aldactone. Her lisinopril has been discontinued.  Implantable defibrillator-St. Jude The patient's device was interrogated.  The information was reviewed. No changes were made in the programming.      atrial fibrillation   Renal Insufficiency Grade 4  We will have to stop her aldactone  Congestive heart failure-chronic-systolic We'll continue her on apixaban. At 2.5 mg  Bid given renal insufficiency and wt 73 kg  Refill carvedilol  Will alert PCP and Dr Justin Mend re change in Cr

## 2014-05-21 LAB — COMPREHENSIVE METABOLIC PANEL
ALT: 66 U/L — ABNORMAL HIGH (ref 0–35)
AST: 61 U/L — ABNORMAL HIGH (ref 0–37)
Albumin: 3.8 g/dL (ref 3.5–5.2)
Alkaline Phosphatase: 96 U/L (ref 39–117)
BILIRUBIN TOTAL: 0.6 mg/dL (ref 0.2–1.2)
BUN: 40 mg/dL — AB (ref 6–23)
CO2: 26 meq/L (ref 19–32)
CREATININE: 2.4 mg/dL — AB (ref 0.4–1.2)
Calcium: 10 mg/dL (ref 8.4–10.5)
Chloride: 100 mEq/L (ref 96–112)
GFR: 25.39 mL/min — AB (ref >60.00)
GLUCOSE: 71 mg/dL (ref 70–99)
Potassium: 5.3 mEq/L — ABNORMAL HIGH (ref 3.5–5.1)
Sodium: 134 mEq/L — ABNORMAL LOW (ref 135–145)
Total Protein: 7.9 g/dL (ref 6.0–8.3)

## 2014-05-21 LAB — TSH: TSH: 0.78 u[IU]/mL (ref 0.35–4.50)

## 2014-05-25 ENCOUNTER — Other Ambulatory Visit: Payer: Self-pay | Admitting: Internal Medicine

## 2014-05-26 ENCOUNTER — Other Ambulatory Visit: Payer: Self-pay | Admitting: *Deleted

## 2014-05-26 DIAGNOSIS — R899 Unspecified abnormal finding in specimens from other organs, systems and tissues: Secondary | ICD-10-CM

## 2014-05-30 ENCOUNTER — Other Ambulatory Visit (INDEPENDENT_AMBULATORY_CARE_PROVIDER_SITE_OTHER): Payer: Medicare PPO | Admitting: *Deleted

## 2014-05-30 DIAGNOSIS — R899 Unspecified abnormal finding in specimens from other organs, systems and tissues: Secondary | ICD-10-CM

## 2014-06-01 LAB — BASIC METABOLIC PANEL
BUN: 52 mg/dL — ABNORMAL HIGH (ref 6–23)
CHLORIDE: 106 meq/L (ref 96–112)
CO2: 21 mEq/L (ref 19–32)
Calcium: 9.5 mg/dL (ref 8.4–10.5)
Creatinine, Ser: 2.7 mg/dL — ABNORMAL HIGH (ref 0.4–1.2)
GFR: 22.57 mL/min — ABNORMAL LOW (ref >60.00)
Glucose, Bld: 77 mg/dL (ref 70–99)
POTASSIUM: 4.9 meq/L (ref 3.5–5.1)
SODIUM: 139 meq/L (ref 135–145)

## 2014-06-05 ENCOUNTER — Encounter (HOSPITAL_COMMUNITY): Payer: Self-pay | Admitting: Cardiovascular Disease

## 2014-06-05 ENCOUNTER — Other Ambulatory Visit: Payer: Self-pay | Admitting: *Deleted

## 2014-06-05 MED ORDER — AMIODARONE HCL 200 MG PO TABS: 200.0000 mg | ORAL_TABLET | Freq: Every day | ORAL | Status: DC

## 2014-06-10 ENCOUNTER — Ambulatory Visit (INDEPENDENT_AMBULATORY_CARE_PROVIDER_SITE_OTHER): Payer: Medicare PPO | Admitting: Pharmacist Clinician (PhC)/ Clinical Pharmacy Specialist

## 2014-06-10 DIAGNOSIS — I480 Paroxysmal atrial fibrillation: Secondary | ICD-10-CM

## 2014-06-13 NOTE — Progress Notes (Signed)
Pt was started on Eliquis for atrial fibrillation in February 2015.    Reviewed patients medication list.  Pt is not currently on any combined P-gp and strong CYP3A4 inhibitors/inducers (ketoconazole, traconazole, ritonavir, carbamazepine, phenytoin, rifampin, St. John's wort).  Reviewed labs.  SCr 2.7, Weight 161, CrCl- 22.4.  Dose inappropriate based on CrCl.   Hgb and HCT Within Normal Limits   NOTE:  Renal function has been worsening recently.  MD made changes to her medications, including decreasing dose of Eliquis to 2.5mg  twice daily.  Will ask patient to repeat labs in 3-4 weeks to see if improvement in kidney function.  If not, would recommend patient be switched from Eliquis to warfarin.   A full discussion of the nature of anticoagulants has been carried out.  A benefit/risk analysis has been presented to the patient, so that they understand the justification for choosing anticoagulation with Eliquis at this time.  The need for compliance is stressed.  Pt is aware to take the medication twice daily.  Side effects of potential bleeding are discussed, including unusual colored urine or stools, coughing up blood or coffee ground emesis, nose bleeds or serious fall or head trauma.  Discussed signs and symptoms of stroke. The patient should avoid any OTC items containing aspirin or ibuprofen.  Avoid alcohol consumption.   Call if any signs of abnormal bleeding.  Discussed financial obligations and resolved any difficulty in obtaining medication.   Asked that patient repeat labs on January 11.  She will do so after work that day.  If CrCl is still <25 need to review with MD whether pt needs to be switched to warfarin

## 2014-07-07 ENCOUNTER — Other Ambulatory Visit (INDEPENDENT_AMBULATORY_CARE_PROVIDER_SITE_OTHER): Payer: Medicare PPO | Admitting: *Deleted

## 2014-07-07 DIAGNOSIS — I480 Paroxysmal atrial fibrillation: Secondary | ICD-10-CM

## 2014-07-07 LAB — BASIC METABOLIC PANEL
BUN: 29 mg/dL — AB (ref 6–23)
CALCIUM: 9.8 mg/dL (ref 8.4–10.5)
CHLORIDE: 105 meq/L (ref 96–112)
CO2: 28 meq/L (ref 19–32)
Creatinine, Ser: 1.6 mg/dL — ABNORMAL HIGH (ref 0.4–1.2)
GFR: 40.33 mL/min — AB (ref >60.00)
Glucose, Bld: 85 mg/dL (ref 70–99)
POTASSIUM: 4.3 meq/L (ref 3.5–5.1)
Sodium: 139 mEq/L (ref 135–145)

## 2014-07-07 LAB — CBC
HCT: 36.4 % (ref 36.0–46.0)
HEMOGLOBIN: 11.9 g/dL — AB (ref 12.0–15.0)
MCHC: 32.5 g/dL (ref 30.0–36.0)
MCV: 81 fl (ref 78.0–100.0)
PLATELETS: 313 10*3/uL (ref 150.0–400.0)
RBC: 4.5 Mil/uL (ref 3.87–5.11)
RDW: 16.7 % — ABNORMAL HIGH (ref 11.5–15.5)
WBC: 6.9 10*3/uL (ref 4.0–10.5)

## 2014-08-14 ENCOUNTER — Telehealth: Payer: Self-pay | Admitting: Internal Medicine

## 2014-08-14 NOTE — Telephone Encounter (Signed)
New message     Pt c/o medication issue:  1. Name of Medication: eliquis   2. How are you currently taking this medication (dosage and times per day)? 2.5mg   3. Are you having a reaction (difficulty breathing--STAT)? no  4. What is your medication issue? Pt is out of medication and we do not have any samples (per [facility]).  Pt want to know if she will get sick being off medication until she can get some.  At the moment, she does not have the money to get it filled

## 2014-08-14 NOTE — Telephone Encounter (Signed)
Patient advised I will look into samples at other offices.  She and I reviewed risks involved with not taking anticoagulation with AFib. She understands. I am mailing her medication assistance forms to see if we can get assistance for her.  She was very thankful for that.

## 2014-08-15 NOTE — Telephone Encounter (Signed)
Follow up      Pt has been out of eliquis for several days.  We still do not have any. Pt request to talk to [facility]

## 2014-08-15 NOTE — Telephone Encounter (Signed)
Left detailed message that I am leaving samples at the front desk for her to pick up. (Informed her, per pharmacist, that she may split pills in half)

## 2014-08-15 NOTE — Telephone Encounter (Signed)
Patient advised by scheduler that we do not have any samples in the office.

## 2014-08-21 ENCOUNTER — Ambulatory Visit (INDEPENDENT_AMBULATORY_CARE_PROVIDER_SITE_OTHER): Payer: Medicare PPO | Admitting: *Deleted

## 2014-08-21 DIAGNOSIS — Z95810 Presence of automatic (implantable) cardiac defibrillator: Secondary | ICD-10-CM

## 2014-08-21 DIAGNOSIS — R001 Bradycardia, unspecified: Secondary | ICD-10-CM

## 2014-08-21 DIAGNOSIS — I428 Other cardiomyopathies: Secondary | ICD-10-CM

## 2014-08-21 DIAGNOSIS — I429 Cardiomyopathy, unspecified: Secondary | ICD-10-CM | POA: Diagnosis not present

## 2014-08-21 DIAGNOSIS — I5022 Chronic systolic (congestive) heart failure: Secondary | ICD-10-CM | POA: Diagnosis not present

## 2014-08-21 LAB — MDC_IDC_ENUM_SESS_TYPE_INCLINIC
Brady Statistic RA Percent Paced: 8.3 %
Brady Statistic RV Percent Paced: 2.8 %
Date Time Interrogation Session: 20160225165535
HIGH POWER IMPEDANCE MEASURED VALUE: 63 Ohm
Lead Channel Impedance Value: 450 Ohm
Lead Channel Impedance Value: 462.5 Ohm
Lead Channel Pacing Threshold Amplitude: 0.5 V
Lead Channel Pacing Threshold Pulse Width: 0.5 ms
Lead Channel Pacing Threshold Pulse Width: 0.9 ms
Lead Channel Pacing Threshold Pulse Width: 0.9 ms
Lead Channel Sensing Intrinsic Amplitude: 10.6 mV
Lead Channel Setting Pacing Amplitude: 3 V
MDC IDC MSMT BATTERY REMAINING LONGEVITY: 81.6 mo
MDC IDC MSMT LEADCHNL RA PACING THRESHOLD AMPLITUDE: 0.5 V
MDC IDC MSMT LEADCHNL RA PACING THRESHOLD PULSEWIDTH: 0.5 ms
MDC IDC MSMT LEADCHNL RA SENSING INTR AMPL: 4.2 mV
MDC IDC MSMT LEADCHNL RV PACING THRESHOLD AMPLITUDE: 1.5 V
MDC IDC MSMT LEADCHNL RV PACING THRESHOLD AMPLITUDE: 1.5 V
MDC IDC PG MODEL: 2411
MDC IDC PG SERIAL: 7093938
MDC IDC SET LEADCHNL RA PACING AMPLITUDE: 2 V
MDC IDC SET LEADCHNL RV PACING PULSEWIDTH: 0.9 ms
MDC IDC SET LEADCHNL RV SENSING SENSITIVITY: 0.5 mV
MDC IDC SET ZONE DETECTION INTERVAL: 250 ms
Zone Setting Detection Interval: 300 ms
Zone Setting Detection Interval: 330 ms

## 2014-08-21 NOTE — Progress Notes (Signed)
ICD check in clinic. Normal device function. Thresholds and sensing consistent with previous device measurements. Impedance trends stable over time. No evidence of any ventricular arrhythmias. No mode switches. Histogram distribution appropriate for patient and level of activity. CorVue abnormal 2/4 x7d, 1/7 x8d, 06/11/14 x8d, 05/23/14 x18d. Device programmed at appropriate safety margins---changed pulse width from 0.5 to 0.27ms. Device programmed to optimize intrinsic conduction. Estimated longevity 5.2-6.14yrs. Alert vibration demonstrated for patient, pt knows to call clinic if felt. ROV w/ device clinic 11/20/14 & w/ Dr. Caryl Comes 11/16.

## 2014-08-29 ENCOUNTER — Emergency Department (HOSPITAL_COMMUNITY)
Admission: EM | Admit: 2014-08-29 | Discharge: 2014-08-29 | Disposition: A | Discharge status: Home / Self Care | Payer: Medicare PPO | Attending: Emergency Medicine | Admitting: Emergency Medicine

## 2014-08-29 ENCOUNTER — Encounter (HOSPITAL_COMMUNITY): Payer: Self-pay

## 2014-08-29 ENCOUNTER — Emergency Department (HOSPITAL_COMMUNITY): Payer: Medicare PPO

## 2014-08-29 DIAGNOSIS — N183 Chronic kidney disease, stage 3 (moderate): Secondary | ICD-10-CM | POA: Diagnosis not present

## 2014-08-29 DIAGNOSIS — Z7901 Long term (current) use of anticoagulants: Secondary | ICD-10-CM | POA: Diagnosis not present

## 2014-08-29 DIAGNOSIS — E785 Hyperlipidemia, unspecified: Secondary | ICD-10-CM | POA: Insufficient documentation

## 2014-08-29 DIAGNOSIS — R109 Unspecified abdominal pain: Secondary | ICD-10-CM

## 2014-08-29 DIAGNOSIS — Z9889 Other specified postprocedural states: Secondary | ICD-10-CM | POA: Diagnosis not present

## 2014-08-29 DIAGNOSIS — Z79899 Other long term (current) drug therapy: Secondary | ICD-10-CM | POA: Diagnosis not present

## 2014-08-29 DIAGNOSIS — Z95810 Presence of automatic (implantable) cardiac defibrillator: Secondary | ICD-10-CM | POA: Insufficient documentation

## 2014-08-29 DIAGNOSIS — I129 Hypertensive chronic kidney disease with stage 1 through stage 4 chronic kidney disease, or unspecified chronic kidney disease: Secondary | ICD-10-CM | POA: Diagnosis not present

## 2014-08-29 DIAGNOSIS — N23 Unspecified renal colic: Secondary | ICD-10-CM | POA: Diagnosis not present

## 2014-08-29 DIAGNOSIS — I5022 Chronic systolic (congestive) heart failure: Secondary | ICD-10-CM | POA: Insufficient documentation

## 2014-08-29 DIAGNOSIS — Z87891 Personal history of nicotine dependence: Secondary | ICD-10-CM | POA: Insufficient documentation

## 2014-08-29 DIAGNOSIS — I472 Ventricular tachycardia: Secondary | ICD-10-CM | POA: Insufficient documentation

## 2014-08-29 LAB — CBC WITH DIFFERENTIAL/PLATELET
Basophils Absolute: 0.1 10*3/uL (ref 0.0–0.1)
Basophils Relative: 1 % (ref 0–1)
EOS PCT: 3 % (ref 0–5)
Eosinophils Absolute: 0.1 10*3/uL (ref 0.0–0.7)
HEMATOCRIT: 37.2 % (ref 36.0–46.0)
Hemoglobin: 12.3 g/dL (ref 12.0–15.0)
Lymphocytes Relative: 27 % (ref 12–46)
Lymphs Abs: 1.5 10*3/uL (ref 0.7–4.0)
MCH: 26.7 pg (ref 26.0–34.0)
MCHC: 33.1 g/dL (ref 30.0–36.0)
MCV: 80.7 fL (ref 78.0–100.0)
MONOS PCT: 17 % — AB (ref 3–12)
Monocytes Absolute: 1 10*3/uL (ref 0.1–1.0)
NEUTROS PCT: 52 % (ref 43–77)
Neutro Abs: 2.9 10*3/uL (ref 1.7–7.7)
Platelets: 279 10*3/uL (ref 150–400)
RBC: 4.61 MIL/uL (ref 3.87–5.11)
RDW: 15.4 % (ref 11.5–15.5)
WBC: 5.6 10*3/uL (ref 4.0–10.5)

## 2014-08-29 LAB — URINE MICROSCOPIC-ADD ON

## 2014-08-29 LAB — URINALYSIS, ROUTINE W REFLEX MICROSCOPIC
Bilirubin Urine: NEGATIVE
GLUCOSE, UA: NEGATIVE mg/dL
Ketones, ur: NEGATIVE mg/dL
Nitrite: NEGATIVE
Protein, ur: NEGATIVE mg/dL
Specific Gravity, Urine: 1.017 (ref 1.005–1.030)
Urobilinogen, UA: 1 mg/dL (ref 0.0–1.0)
pH: 6 (ref 5.0–8.0)

## 2014-08-29 LAB — I-STAT CG4 LACTIC ACID, ED: Lactic Acid, Venous: 0.65 mmol/L (ref 0.5–2.0)

## 2014-08-29 LAB — COMPREHENSIVE METABOLIC PANEL
ALT: 20 U/L (ref 0–35)
ANION GAP: 9 (ref 5–15)
AST: 28 U/L (ref 0–37)
Albumin: 3.5 g/dL (ref 3.5–5.2)
Alkaline Phosphatase: 90 U/L (ref 39–117)
BILIRUBIN TOTAL: 0.9 mg/dL (ref 0.3–1.2)
BUN: 29 mg/dL — AB (ref 6–23)
CO2: 22 mmol/L (ref 19–32)
CREATININE: 1.58 mg/dL — AB (ref 0.50–1.10)
Calcium: 9.6 mg/dL (ref 8.4–10.5)
Chloride: 106 mmol/L (ref 96–112)
GFR calc Af Amer: 37 mL/min — ABNORMAL LOW (ref >90)
GFR calc non Af Amer: 32 mL/min — ABNORMAL LOW (ref >90)
Glucose, Bld: 119 mg/dL — ABNORMAL HIGH (ref 70–99)
POTASSIUM: 4.2 mmol/L (ref 3.5–5.1)
SODIUM: 137 mmol/L (ref 135–145)
Total Protein: 7.4 g/dL (ref 6.0–8.3)

## 2014-08-29 MED ORDER — HYDROMORPHONE HCL 1 MG/ML IJ SOLN
0.5000 mg | Freq: Once | INTRAMUSCULAR | Status: AC
Start: 2014-08-29 — End: 2014-08-29
  Administered 2014-08-29: 0.5 mg via INTRAVENOUS
  Filled 2014-08-29: qty 1

## 2014-08-29 MED ORDER — ONDANSETRON HCL 4 MG/2ML IJ SOLN
4.0000 mg | Freq: Once | INTRAMUSCULAR | Status: AC
  Administered 2014-08-29: 4 mg via INTRAVENOUS
  Filled 2014-08-29: qty 2

## 2014-08-29 MED ORDER — OXYCODONE-ACETAMINOPHEN 5-325 MG PO TABS: 0.5000 | ORAL_TABLET | ORAL | Status: DC | PRN

## 2014-08-29 NOTE — ED Provider Notes (Signed)
CSN: [id number]     Arrival date & time 08/29/14  1848 History   First MD Initiated Contact with Patient 08/29/14 1854     Chief Complaint  Patient presents with  . Flank Pain     (Consider location/radiation/quality/duration/timing/severity/associated sxs/prior Treatment) HPI Comments: Patient presents to the ER for evaluation of right flank pain. Patient reports sudden pain in the right side of her back and flank area that began at 2:30 PM. Pain was dull aching at first, but an hour later became much more severe. She was brought to the ER by ambulance. She was given fentanyl by EMS and pain is now resolved. Patient has nausea but no vomiting associated with the symptoms. No fever. No urinary symptoms noted.  Patient is a 69 y.o. female presenting with flank pain.  Flank Pain    Past Medical History  Diagnosis Date  . Hypertension   . Hyperlipidemia   . Nonischemic cardiomyopathy     EF 10-15% on 11/2012 cath  . Chronic systolic CHF (congestive heart failure)   . History of tobacco abuse   . Ventricular tachycardia 12/14/2012  . Sinus bradycardia 12/21/2012  . Chronic renal insufficiency, stage III (moderate)     Cr 2.0 5/15   Past Surgical History  Procedure Laterality Date  . Implantable cardioverter defibrillator implant  12/19/2012    St. Jude dual-chamber ICD, serial number [id number]  . Left and right heart catheterization with coronary angiogram N/A 12/17/2012    Procedure: LEFT AND RIGHT HEART CATHETERIZATION WITH CORONARY ANGIOGRAM;  Surgeon: Sherren Mocha, MD;  Location: [facility] CATH LAB;  Service: Cardiovascular;  Laterality: N/A;  . Implantable cardioverter defibrillator implant N/A 12/19/2012    Procedure: IMPLANTABLE CARDIOVERTER DEFIBRILLATOR IMPLANT;  Surgeon: Deboraha Sprang, MD;  Location: [facility] CATH LAB;  Service: Cardiovascular;  Laterality: N/A;   History reviewed. No pertinent family history. History  Substance Use Topics  . Smoking status: Former Research scientist (life sciences)  . Smokeless  tobacco: Not on file  . Alcohol Use: No   OB History    No data available     Review of Systems  Genitourinary: Positive for flank pain.  All other systems reviewed and are negative.     Allergies  Strawberry  Home Medications   Prior to Admission medications   Medication Sig Start Date End Date Taking? Authorizing Provider  amiodarone (PACERONE) 200 MG tablet Take 1 tablet (200 mg total) by mouth daily. 06/05/14  Yes Deboraha Sprang, MD  apixaban (ELIQUIS) 2.5 MG TABS tablet Take 2.5 mg by mouth 2 (two) times daily.   Yes Historical Provider, MD  carvedilol (COREG) 25 MG tablet Take 0.5 tablets (12.5 mg total) by mouth 2 (two) times daily. 05/20/14  Yes Deboraha Sprang, MD  Cholecalciferol (VITAMIN D-3 PO) Take 1 tablet by mouth daily.    Yes Historical Provider, MD  furosemide (LASIX) 40 MG tablet Take 40 mg by mouth daily.   Yes Historical Provider, MD  pantoprazole (PROTONIX) 40 MG tablet Take 40 mg by mouth daily.   Yes Historical Provider, MD  simvastatin (ZOCOR) 20 MG tablet TAKE ONE TABLET BY MOUTH ONCE DAILY IN THE EVENING 05/27/14  Yes Deboraha Sprang, MD  oxyCODONE-acetaminophen (PERCOCET) 5-325 MG per tablet Take 0.5 tablets by mouth every 4 (four) hours as needed. 08/29/14   Orpah Greek, MD   BP 157/70 mmHg  Pulse 66  Temp(Src) 99 F (37.2 C) (Oral)  Resp 17  Ht 5\' 5"  (1.651 m)  Wt 155  lb (70.308 kg)  BMI 25.79 kg/m2  SpO2 95% Physical Exam  Constitutional: She is oriented to person, place, and time. She appears well-developed and well-nourished. No distress.  HENT:  Head: Normocephalic and atraumatic.  Right Ear: Hearing normal.  Left Ear: Hearing normal.  Nose: Nose normal.  Mouth/Throat: Oropharynx is clear and moist and mucous membranes are normal.  Eyes: Conjunctivae and EOM are normal. Pupils are equal, round, and reactive to light.  Neck: Normal range of motion. Neck supple.  Cardiovascular: Regular rhythm, S1 normal and S2 normal.  Exam  reveals no gallop and no friction rub.   No murmur heard. Pulmonary/Chest: Effort normal and breath sounds normal. No respiratory distress. She exhibits no tenderness.  Abdominal: Soft. Normal appearance and bowel sounds are normal. There is no hepatosplenomegaly. There is no tenderness. There is no rebound, no guarding, no tenderness at McBurney's point and negative Murphy's sign. No hernia.  Musculoskeletal: Normal range of motion.  Neurological: She is alert and oriented to person, place, and time. She has normal strength. No cranial nerve deficit or sensory deficit. Coordination normal. GCS eye subscore is 4. GCS verbal subscore is 5. GCS motor subscore is 6.  Skin: Skin is warm, dry and intact. No rash noted. No cyanosis.  Psychiatric: She has a normal mood and affect. Her speech is normal and behavior is normal. Thought content normal.  Nursing note and vitals reviewed.   ED Course  Procedures (including critical care time) Labs Review Labs Reviewed  CBC WITH DIFFERENTIAL/PLATELET - Abnormal; Notable for the following:    Monocytes Relative 17 (*)    All other components within normal limits  COMPREHENSIVE METABOLIC PANEL - Abnormal; Notable for the following:    Glucose, Bld 119 (*)    BUN 29 (*)    Creatinine, Ser 1.58 (*)    GFR calc non Af Amer 32 (*)    GFR calc Af Amer 37 (*)    All other components within normal limits  URINALYSIS, ROUTINE W REFLEX MICROSCOPIC - Abnormal; Notable for the following:    APPearance CLOUDY (*)    Hgb urine dipstick SMALL (*)    Leukocytes, UA TRACE (*)    All other components within normal limits  URINE MICROSCOPIC-ADD ON  I-STAT CG4 LACTIC ACID, ED    Imaging Review Ct Renal Stone Study  08/29/2014   CLINICAL DATA:  Right flank pain. Hyperlipidemia. Cardiomyopathy. Tobacco abuse. Chronic renal insufficiency.  EXAM: CT ABDOMEN AND PELVIS WITHOUT CONTRAST  TECHNIQUE: Multidetector CT imaging of the abdomen and pelvis was performed following  the standard protocol without IV contrast.  COMPARISON:  09/09/2013 abdominal ultrasound.  No prior CT.  FINDINGS: Lower chest: Mild scarring at the lung bases. Cardiomegaly, with incompletely imaged pacer. Trace pleural fluid or thickening bilaterally.  Hepatobiliary: Normal liver. Normal gallbladder, without biliary ductal dilatation.  Pancreas: Normal, without mass or ductal dilatation.  Spleen: Normal  Adrenals/Urinary Tract: Normal left adrenal gland. Suspect a fat density right adrenal nodule of 11 mm on image 22, most consistent with a myelolipoma.  Right renal collecting system calculi. Interpolar 8 mm low-density left renal lesion which is likely a cyst. Moderate right-sided urinary tract obstruction. This is followed to the level of the urinary bladder. A punctate stone is identified dependently on image 66 transverse and image 87 coronal. This is presumably just passed from the right ureter.  Stomach/Bowel: Portions of the stomach underdistended. Extensive colonic diverticulosis. Normal terminal ileum. Normal caliber small bowel.  Vascular/Lymphatic: Aortic  and branch vessel atherosclerosis. No abdominopelvic adenopathy.  Reproductive: Uterine fibroids.  No adnexal mass.  Other: No significant free fluid. right femoral triangle hernia on image 68 containing minimal small bowel. No obstruction.  Musculoskeletal: Spondylosis and degenerative disc disease, including at L4-5 and L5-S1. Grade 1 L5-S1 anterolisthesis.  IMPRESSION: 1. Punctate bladder stone, presumably recently passed from a dilated right urinary tract. 2. Right nephrolithiasis. 3. Right adrenal myelolipoma. 4. Uterine fibroids. 5. Small bowel containing right femoral triangle hernia. No obstruction or other acute complication.   Electronically Signed   By: Abigail Miyamoto M.D.   On: 08/29/2014 23:06     EKG Interpretation None      MDM   Final diagnoses:  Acute right flank pain  Renal colic on right side    Patient presented to the  ER for evaluation of sudden onset right flank pain. Patient has had significant improvement of her pain since her arrival. Workup reveals renal stones as well as a punctate bladder stone with right-sided hydronephrosis. This is consistent with recently passed stone. Patient discharged with analgesia, follow up with urology.    Orpah Greek, MD 08/31/14 [phone]

## 2014-08-29 NOTE — ED Notes (Signed)
Patient states pain is 1/10, nausea relieved, stable, wheelchair assist.

## 2014-08-29 NOTE — Discharge Instructions (Signed)

## 2014-08-29 NOTE — ED Notes (Addendum)
Pt brought in by EMS for right flank pain that began around 1430 this afternoon.  EMS reports that pt was cleaning the house around 1530 when the pain intensified.  The pain has gotten progressively worse throughout the day and pt now has difficulty ambulating due to pain.  153mcg of Fentanyl given PTA and pt is now c/o no pain.

## 2014-09-05 ENCOUNTER — Encounter: Payer: Self-pay | Admitting: Internal Medicine

## 2014-09-16 ENCOUNTER — Other Ambulatory Visit: Payer: Self-pay | Admitting: Internal Medicine

## 2014-11-21 ENCOUNTER — Ambulatory Visit (INDEPENDENT_AMBULATORY_CARE_PROVIDER_SITE_OTHER): Payer: Medicare PPO | Admitting: *Deleted

## 2014-11-21 DIAGNOSIS — I493 Ventricular premature depolarization: Secondary | ICD-10-CM

## 2014-11-21 DIAGNOSIS — I5022 Chronic systolic (congestive) heart failure: Secondary | ICD-10-CM

## 2014-11-21 DIAGNOSIS — I480 Paroxysmal atrial fibrillation: Secondary | ICD-10-CM | POA: Diagnosis not present

## 2014-11-21 DIAGNOSIS — I472 Ventricular tachycardia, unspecified: Secondary | ICD-10-CM

## 2014-11-21 DIAGNOSIS — I429 Cardiomyopathy, unspecified: Secondary | ICD-10-CM

## 2014-11-21 DIAGNOSIS — R001 Bradycardia, unspecified: Secondary | ICD-10-CM | POA: Diagnosis not present

## 2014-11-21 DIAGNOSIS — I428 Other cardiomyopathies: Secondary | ICD-10-CM

## 2014-11-21 LAB — CUP PACEART INCLINIC DEVICE CHECK
Brady Statistic RV Percent Paced: 0.65 %
Date Time Interrogation Session: 20160527164415
HighPow Impedance: 69.75 Ohm
Lead Channel Impedance Value: 512.5 Ohm
Lead Channel Pacing Threshold Amplitude: 0.75 V
Lead Channel Pacing Threshold Amplitude: 1.25 V
Lead Channel Pacing Threshold Pulse Width: 0.5 ms
Lead Channel Pacing Threshold Pulse Width: 0.9 ms
Lead Channel Pacing Threshold Pulse Width: 0.9 ms
Lead Channel Sensing Intrinsic Amplitude: 11.8 mV
Lead Channel Sensing Intrinsic Amplitude: 5 mV
Lead Channel Setting Pacing Amplitude: 2 V
Lead Channel Setting Pacing Amplitude: 2.5 V
Lead Channel Setting Pacing Pulse Width: 0.9 ms
Lead Channel Setting Sensing Sensitivity: 0.5 mV
MDC IDC MSMT BATTERY REMAINING LONGEVITY: 82.8 mo
MDC IDC MSMT LEADCHNL RA PACING THRESHOLD AMPLITUDE: 0.75 V
MDC IDC MSMT LEADCHNL RA PACING THRESHOLD PULSEWIDTH: 0.5 ms
MDC IDC MSMT LEADCHNL RV IMPEDANCE VALUE: 512.5 Ohm
MDC IDC MSMT LEADCHNL RV PACING THRESHOLD AMPLITUDE: 1.25 V
MDC IDC PG MODEL: 2411
MDC IDC SET ZONE DETECTION INTERVAL: 250 ms
MDC IDC SET ZONE DETECTION INTERVAL: 330 ms
MDC IDC STAT BRADY RA PERCENT PACED: 3.7 %
Pulse Gen Serial Number: 7093938
Zone Setting Detection Interval: 300 ms

## 2014-11-21 NOTE — Progress Notes (Signed)
ICD check in clinic. Normal device function. Thresholds and sensing consistent with previous device measurements. Impedance trends stable over time. No evidence of any ventricular arrhythmias. No mode switches. Histogram distribution appropriate for patient and level of activity. No changes made this session. Device programmed at appropriate safety margins. Device programmed to optimize intrinsic conduction. Estimated longevity 4.5-6.8 years. Plan to follow up with the [facility] on 8/25 @ 1630. Patient education completed including shock plan. Vibration demonstrated for patient.

## 2014-12-10 ENCOUNTER — Other Ambulatory Visit: Payer: Self-pay | Admitting: Internal Medicine

## 2014-12-13 ENCOUNTER — Other Ambulatory Visit: Payer: Self-pay | Admitting: Internal Medicine

## 2014-12-15 ENCOUNTER — Other Ambulatory Visit: Payer: Medicare PPO

## 2014-12-22 ENCOUNTER — Encounter: Payer: Self-pay | Admitting: Internal Medicine

## 2014-12-26 ENCOUNTER — Other Ambulatory Visit: Payer: Self-pay | Admitting: Internal Medicine

## 2015-02-26 ENCOUNTER — Ambulatory Visit (INDEPENDENT_AMBULATORY_CARE_PROVIDER_SITE_OTHER): Payer: Medicare PPO | Admitting: *Deleted

## 2015-02-26 DIAGNOSIS — I5022 Chronic systolic (congestive) heart failure: Secondary | ICD-10-CM | POA: Diagnosis not present

## 2015-02-26 DIAGNOSIS — I480 Paroxysmal atrial fibrillation: Secondary | ICD-10-CM | POA: Diagnosis not present

## 2015-02-26 LAB — CUP PACEART INCLINIC DEVICE CHECK
Brady Statistic RA Percent Paced: 6.8 %
Date Time Interrogation Session: 20160901164426
HighPow Impedance: 63 Ohm
Lead Channel Impedance Value: 462.5 Ohm
Lead Channel Pacing Threshold Amplitude: 1 V
Lead Channel Pacing Threshold Amplitude: 1 V
Lead Channel Pacing Threshold Amplitude: 1.25 V
Lead Channel Pacing Threshold Amplitude: 1.25 V
Lead Channel Pacing Threshold Pulse Width: 0.5 ms
Lead Channel Pacing Threshold Pulse Width: 0.5 ms
Lead Channel Pacing Threshold Pulse Width: 0.9 ms
Lead Channel Pacing Threshold Pulse Width: 0.9 ms
Lead Channel Sensing Intrinsic Amplitude: 11.6 mV
Lead Channel Sensing Intrinsic Amplitude: 4.2 mV
Lead Channel Setting Pacing Amplitude: 2 V
Lead Channel Setting Pacing Amplitude: 2.5 V
Lead Channel Setting Pacing Pulse Width: 0.9 ms
Lead Channel Setting Sensing Sensitivity: 0.5 mV
MDC IDC MSMT BATTERY REMAINING LONGEVITY: 79.2 mo
MDC IDC MSMT LEADCHNL RV IMPEDANCE VALUE: 475 Ohm
MDC IDC SET ZONE DETECTION INTERVAL: 300 ms
MDC IDC STAT BRADY RV PERCENT PACED: 0.3 %
Pulse Gen Model: 2411
Pulse Gen Serial Number: 7093938
Zone Setting Detection Interval: 250 ms
Zone Setting Detection Interval: 330 ms

## 2015-02-26 NOTE — Progress Notes (Signed)
ICD check in clinic. Normal device function. Thresholds and sensing consistent with previous device measurements. Impedance trends stable over time. No evidence of any ventricular arrhythmias. No mode switches. Histogram distribution appropriate for patient and level of activity. No changes made this session. Device programmed at appropriate safety margins. Device programmed to optimize intrinsic conduction. Estimated longevity 5.3-6.6 years. ROV with SK 06/02/15.

## 2015-03-09 ENCOUNTER — Telehealth: Payer: Self-pay | Admitting: Internal Medicine

## 2015-03-09 NOTE — Telephone Encounter (Signed)
New message  Pt c/o Shortness Of Breath: STAT if SOB developed within the last 24 hours or pt is noticeably SOB on the phone  1. Are you currently SOB (can you hear that pt is SOB on the phone)? A little bit but not much (Cannot hear it over the phone)  2. How long have you been experiencing SOB? Started Saturday morning. 5 minute Episodes that would come and go away. Pt states that she has only had two of these episodes over the weekend.  3. Are you SOB when sitting or when up moving around? Moving around. It doesn't last long.   4.  Are you currently experiencing any other symptoms? No other symptoms

## 2015-03-09 NOTE — Telephone Encounter (Signed)
I spoke with the patient. She complains of waking up Saturday morning and feeling "swimmy headed."  She has not been "swimmy headed" since that time.  She does complain of some intermittent SOB with activity and with laying down. She is able to push through her activities when she feels SOB with movement. She denies swelling, change in salt in her diet. She denies heart palpitations or feeling as though her heart is out of rhythm. No recent changes were made to her device settings on 02/26/15 when she came in for her device clinic appointment. Confirmed medications with the patient.  I advised her I would review with Dr. Caryl Comes and call her back later today. She is agreeable.

## 2015-03-10 DIAGNOSIS — H8110 Benign paroxysmal vertigo, unspecified ear: Secondary | ICD-10-CM | POA: Diagnosis not present

## 2015-03-10 NOTE — Telephone Encounter (Signed)
I spoke with the patient and made her aware she needs to come back in for another device interrogation per Dr. Caryl Comes. He is concerned with her history of VT. I advised the patient we would be able to look at her Corvue readings at that time.  She is agreeable. She is scheduled to come in on 03/12/15 at 4:30 pm. Per her report today, SOB is still intermittent as per our discussion yesterday.

## 2015-03-10 NOTE — Telephone Encounter (Signed)
Could you have her send transmission plz

## 2015-03-10 NOTE — Telephone Encounter (Signed)
Called pt and asked if she could send a remote transmission with her home monitor. Pt stated that she does not have a home monitor. Informed her that I would let MD know and we would call her back and let her know what MD wants her to do.

## 2015-03-12 ENCOUNTER — Ambulatory Visit (INDEPENDENT_AMBULATORY_CARE_PROVIDER_SITE_OTHER): Payer: Medicare PPO | Admitting: *Deleted

## 2015-03-12 DIAGNOSIS — I472 Ventricular tachycardia, unspecified: Secondary | ICD-10-CM

## 2015-03-12 DIAGNOSIS — I5022 Chronic systolic (congestive) heart failure: Secondary | ICD-10-CM

## 2015-03-12 LAB — CUP PACEART INCLINIC DEVICE CHECK
Date Time Interrogation Session: 20160915165604
HIGH POWER IMPEDANCE MEASURED VALUE: 66 Ohm
Lead Channel Impedance Value: 475 Ohm
Lead Channel Impedance Value: 512.5 Ohm
Lead Channel Pacing Threshold Amplitude: 1.5 V
Lead Channel Pacing Threshold Pulse Width: 0.5 ms
Lead Channel Pacing Threshold Pulse Width: 0.9 ms
Lead Channel Pacing Threshold Pulse Width: 0.9 ms
Lead Channel Sensing Intrinsic Amplitude: 11.8 mV
Lead Channel Sensing Intrinsic Amplitude: 4.8 mV
Lead Channel Setting Pacing Amplitude: 2 V
Lead Channel Setting Pacing Amplitude: 3 V
Lead Channel Setting Sensing Sensitivity: 0.5 mV
MDC IDC MSMT BATTERY REMAINING LONGEVITY: 79.2 mo
MDC IDC MSMT LEADCHNL RA PACING THRESHOLD AMPLITUDE: 0.75 V
MDC IDC MSMT LEADCHNL RA PACING THRESHOLD AMPLITUDE: 0.75 V
MDC IDC MSMT LEADCHNL RA PACING THRESHOLD PULSEWIDTH: 0.5 ms
MDC IDC MSMT LEADCHNL RV PACING THRESHOLD AMPLITUDE: 1.5 V
MDC IDC SET LEADCHNL RV PACING PULSEWIDTH: 0.9 ms
MDC IDC SET ZONE DETECTION INTERVAL: 250 ms
MDC IDC STAT BRADY RA PERCENT PACED: 5.8 %
MDC IDC STAT BRADY RV PERCENT PACED: 0.35 %
Pulse Gen Model: 2411
Pulse Gen Serial Number: 7093938
Zone Setting Detection Interval: 300 ms
Zone Setting Detection Interval: 330 ms

## 2015-03-12 NOTE — Progress Notes (Signed)
Add-on per SK to check for VT episodes and ICM monitor status. No VT,VF, or NSVT episodes. No AT/AF episodes, burden 0%. CorVue stable. No thoracic impedance irregularities throughout Aug & Sept. Last noted fluid saturation 12/27/14 x10d. ROV w/ SK 06/02/15.

## 2015-03-19 ENCOUNTER — Encounter: Payer: Self-pay | Admitting: Internal Medicine

## 2015-04-07 ENCOUNTER — Encounter: Payer: Self-pay | Admitting: Internal Medicine

## 2015-04-22 DIAGNOSIS — D631 Anemia in chronic kidney disease: Secondary | ICD-10-CM | POA: Diagnosis not present

## 2015-04-22 DIAGNOSIS — R809 Proteinuria, unspecified: Secondary | ICD-10-CM | POA: Diagnosis not present

## 2015-04-22 DIAGNOSIS — N183 Chronic kidney disease, stage 3 (moderate): Secondary | ICD-10-CM | POA: Diagnosis not present

## 2015-04-22 DIAGNOSIS — E785 Hyperlipidemia, unspecified: Secondary | ICD-10-CM | POA: Diagnosis not present

## 2015-04-22 DIAGNOSIS — N2581 Secondary hyperparathyroidism of renal origin: Secondary | ICD-10-CM | POA: Diagnosis not present

## 2015-04-22 DIAGNOSIS — N189 Chronic kidney disease, unspecified: Secondary | ICD-10-CM | POA: Diagnosis not present

## 2015-05-05 ENCOUNTER — Other Ambulatory Visit: Payer: Self-pay | Admitting: Internal Medicine

## 2015-05-25 ENCOUNTER — Other Ambulatory Visit: Payer: Self-pay | Admitting: Internal Medicine

## 2015-05-26 DIAGNOSIS — Z0001 Encounter for general adult medical examination with abnormal findings: Secondary | ICD-10-CM | POA: Diagnosis not present

## 2015-05-26 DIAGNOSIS — E559 Vitamin D deficiency, unspecified: Secondary | ICD-10-CM | POA: Diagnosis not present

## 2015-05-26 DIAGNOSIS — I10 Essential (primary) hypertension: Secondary | ICD-10-CM | POA: Diagnosis not present

## 2015-05-26 DIAGNOSIS — I429 Cardiomyopathy, unspecified: Secondary | ICD-10-CM | POA: Diagnosis not present

## 2015-05-26 DIAGNOSIS — Z79899 Other long term (current) drug therapy: Secondary | ICD-10-CM | POA: Diagnosis not present

## 2015-05-26 DIAGNOSIS — E7800 Pure hypercholesterolemia, unspecified: Secondary | ICD-10-CM | POA: Diagnosis not present

## 2015-05-26 DIAGNOSIS — I472 Ventricular tachycardia: Secondary | ICD-10-CM | POA: Diagnosis not present

## 2015-05-26 DIAGNOSIS — I480 Paroxysmal atrial fibrillation: Secondary | ICD-10-CM | POA: Diagnosis not present

## 2015-05-26 DIAGNOSIS — H8110 Benign paroxysmal vertigo, unspecified ear: Secondary | ICD-10-CM | POA: Diagnosis not present

## 2015-05-26 DIAGNOSIS — Z1389 Encounter for screening for other disorder: Secondary | ICD-10-CM | POA: Diagnosis not present

## 2015-06-02 ENCOUNTER — Ambulatory Visit (INDEPENDENT_AMBULATORY_CARE_PROVIDER_SITE_OTHER): Payer: Medicare PPO | Admitting: Internal Medicine

## 2015-06-02 ENCOUNTER — Encounter: Payer: Self-pay | Admitting: Internal Medicine

## 2015-06-02 VITALS — BP 136/64 | HR 60 | Ht 65.0 in | Wt 157.0 lb

## 2015-06-02 DIAGNOSIS — I4891 Unspecified atrial fibrillation: Secondary | ICD-10-CM | POA: Diagnosis not present

## 2015-06-02 DIAGNOSIS — I480 Paroxysmal atrial fibrillation: Secondary | ICD-10-CM

## 2015-06-02 DIAGNOSIS — I429 Cardiomyopathy, unspecified: Secondary | ICD-10-CM

## 2015-06-02 DIAGNOSIS — R001 Bradycardia, unspecified: Secondary | ICD-10-CM

## 2015-06-02 DIAGNOSIS — I428 Other cardiomyopathies: Secondary | ICD-10-CM

## 2015-06-02 DIAGNOSIS — I5022 Chronic systolic (congestive) heart failure: Secondary | ICD-10-CM | POA: Diagnosis not present

## 2015-06-02 MED ORDER — AMIODARONE HCL 200 MG PO TABS: 200.0000 mg | ORAL_TABLET | Freq: Every day | ORAL | Status: DC

## 2015-06-02 NOTE — Patient Instructions (Addendum)
Medication Instructions: - no changes  Labwork: - Your physician recommends that you have lab work today: CMET/TSH  Procedures/Testing: - none  Follow-Up: - Your physician recommends that you schedule a follow-up appointment in: 3 months with the [facility] physician wants you to follow-up in: 6 months with Chanetta Marshall, NP for Dr. Caryl Comes. You will receive a reminder letter in the mail two months in advance. If you don't receive a letter, please call our office to schedule the follow-up appointment.  Any Additional Special Instructions Will Be Listed Below (If Applicable).

## 2015-06-02 NOTE — Progress Notes (Signed)
Patient Care Team: Stephanie Huddle, MD as PCP - General (Internal Medicine)   HPI  Stephanie Yates is a 69 y.o. female Seen in followup for ventricular tachycardia in the context of nonischemic cardiac myopathy congestive heart failure. As always identified in 6/14 when she presented she treated with IV amiodarone. She received an ICD for secondary prevention.    She is subsequently found to have paroxysmal atrial fibrillation and started on apixaban.  She's tolerating medications well..     most recent potassium 1.583/16 potassium 4.2  She denies chest pain peripheral edema orthopnea. She has a little bit of dyspnea on exertion which is stable. She has not had any palpitations. She is tolerating the reinitiation of her Aldactone and has no amiodarone related symptoms of nausea balance disturbance or cough     Past Medical History  Diagnosis Date  . Hypertension   . Hyperlipidemia   . Nonischemic cardiomyopathy ([place])     EF 10-15% on 11/2012 cath  . Chronic systolic CHF (congestive heart failure) ([place])   . History of tobacco abuse   . Ventricular tachycardia ([place]) 12/14/2012  . Sinus bradycardia 12/21/2012  . Chronic renal insufficiency, stage III (moderate)     Cr 2.0 5/15    Past Surgical History  Procedure Laterality Date  . Implantable cardioverter defibrillator implant  12/19/2012    St. Jude dual-chamber ICD, serial number [id number]  . Left and right heart catheterization with coronary angiogram N/A 12/17/2012    Procedure: LEFT AND RIGHT HEART CATHETERIZATION WITH CORONARY ANGIOGRAM;  Surgeon: Sherren Mocha, MD;  Location: [facility] CATH LAB;  Service: Cardiovascular;  Laterality: N/A;  . Implantable cardioverter defibrillator implant N/A 12/19/2012    Procedure: IMPLANTABLE CARDIOVERTER DEFIBRILLATOR IMPLANT;  Surgeon: Deboraha Sprang, MD;  Location: [facility] CATH LAB;  Service: Cardiovascular;  Laterality: N/A;    Current Outpatient Prescriptions  Medication Sig Dispense Refill    . amiodarone (PACERONE) 200 MG tablet TAKE ONE TABLET BY MOUTH ONCE DAILY 30 tablet 0  . apixaban (ELIQUIS) 2.5 MG TABS tablet Take 2.5 mg by mouth 2 (two) times daily.    . carvedilol (COREG) 12.5 MG tablet Take 1 tablet by mouth 2 (two) times daily.    . Cholecalciferol (VITAMIN D-3 PO) Take 1,000 Units by mouth 2 (two) times daily.     . ciprofloxacin (CIPRO) 500 MG tablet Take 1 tablet by mouth 2 (two) times daily. For 7 days    . ELIQUIS 2.5 MG TABS tablet TAKE ONE TABLET BY MOUTH TWICE DAILY 60 tablet 0  . furosemide (LASIX) 40 MG tablet Take 40 mg by mouth daily.    . pantoprazole (PROTONIX) 40 MG tablet Take 40 mg by mouth daily.    . simvastatin (ZOCOR) 20 MG tablet TAKE ONE TABLET BY MOUTH IN THE EVENING 30 tablet 5  . spironolactone (ALDACTONE) 25 MG tablet Take 0.5 tablets by mouth daily.     No current facility-administered medications for this visit.    Allergies  Allergen Reactions  . Strawberry Extract Hives and Itching    Hives and itching     Review of Systems negative except from HPI and PMH  Physical Exam BP 136/64 mmHg  Pulse 60  Ht 5\' 5"  (1.651 m)  Wt 157 lb (71.215 kg)  BMI 26.13 kg/m2 Well developed and well nourished in no acute distress HENT normal E scleral and icterus clear Neck Supple JVP flat; carotids brisk and full Clear to ausculation Device pocket well  healed; without hematoma or erythema.  There is no tetheringRegular rate and rhythm, no murmurs gallops or rub Soft with active bowel sounds No clubbing cyanosis  Edema Alert and oriented, grossly normal motor and sensory function Skin Warm and Dry  ECG demonstrates sinus rhythm at 62 Intervals 21/12/43 Axis is left -42 Prior inferolateral MI  Assessment and  Plan  Ventricular tachycardia No intercurrent Ventricular tachycardia  Nonischemic cardio myopathy continue current medications. We will check a potassium level today on the Aldactone. Her lisinopril has been  discontinued.  Implantable defibrillator-St. Jude The patient's device was interrogated.  The information was reviewed. No changes were made in the programming.     Atrial fibrillation   Renal Insufficiency Grade 3    Congestive heart failure-chronic-systolic  Dr. Inda Merlin has appropriately resumed her Aldactone although we don't have blood work since its reinitiation. We will check him today we will also check amiodarone surveillance laboratories which she takes for her atrial fibrillation and ventricular tachycardia both of which have been quiescent.   She is euvolemic.

## 2015-06-03 LAB — COMPREHENSIVE METABOLIC PANEL
ALBUMIN: 3.1 g/dL — AB (ref 3.6–5.1)
ALT: 98 U/L — ABNORMAL HIGH (ref 6–29)
AST: 177 U/L — ABNORMAL HIGH (ref 10–35)
Alkaline Phosphatase: 126 U/L (ref 33–130)
BUN: 27 mg/dL — ABNORMAL HIGH (ref 7–25)
CHLORIDE: 104 mmol/L (ref 98–110)
CO2: 22 mmol/L (ref 20–31)
Calcium: 8.7 mg/dL (ref 8.6–10.4)
Creat: 1.2 mg/dL — ABNORMAL HIGH (ref 0.60–0.93)
Glucose, Bld: 81 mg/dL (ref 65–99)
POTASSIUM: 5 mmol/L (ref 3.5–5.3)
Sodium: 135 mmol/L (ref 135–146)
TOTAL PROTEIN: 7.3 g/dL (ref 6.1–8.1)
Total Bilirubin: 0.8 mg/dL (ref 0.2–1.2)

## 2015-06-03 LAB — CUP PACEART INCLINIC DEVICE CHECK
Date Time Interrogation Session: 20161207141754
Implantable Lead Implant Date: 20140625
Lead Channel Setting Pacing Amplitude: 2 V
Lead Channel Setting Pacing Amplitude: 3 V
Lead Channel Setting Pacing Pulse Width: 0.9 ms
Lead Channel Setting Sensing Sensitivity: 0.5 mV
MDC IDC LEAD IMPLANT DT: 20140625
MDC IDC LEAD LOCATION: 753859
MDC IDC LEAD LOCATION: 753860
Pulse Gen Serial Number: 7093938

## 2015-06-03 LAB — TSH: TSH: 2.149 u[IU]/mL (ref 0.350–4.500)

## 2015-06-08 ENCOUNTER — Other Ambulatory Visit: Payer: Self-pay | Admitting: *Deleted

## 2015-06-26 ENCOUNTER — Telehealth: Payer: Self-pay | Admitting: *Deleted

## 2015-06-26 NOTE — Telephone Encounter (Signed)
Patient calling for Eliquis Samples. Samples placed at front desk.

## 2015-07-13 ENCOUNTER — Other Ambulatory Visit: Payer: Medicare PPO

## 2015-08-03 ENCOUNTER — Telehealth: Payer: Self-pay | Admitting: Internal Medicine

## 2015-08-03 NOTE — Telephone Encounter (Signed)
placed samples up front, pt aware.

## 2015-08-03 NOTE — Telephone Encounter (Signed)
New message     Patient calling the office for samples of medication:   1.  What medication and dosage are you requesting samples for? eliquis 2.5mg   2.  Are you currently out of this medication? Yes---out since friday

## 2015-08-28 ENCOUNTER — Other Ambulatory Visit: Payer: Self-pay | Admitting: Internal Medicine

## 2015-08-28 DIAGNOSIS — K2970 Gastritis, unspecified, without bleeding: Secondary | ICD-10-CM

## 2015-09-02 ENCOUNTER — Other Ambulatory Visit: Payer: Self-pay

## 2015-09-07 ENCOUNTER — Other Ambulatory Visit: Payer: Self-pay

## 2015-09-23 ENCOUNTER — Encounter: Payer: Self-pay | Admitting: Internal Medicine

## 2015-10-08 ENCOUNTER — Telehealth: Payer: Self-pay | Admitting: Internal Medicine

## 2015-10-08 NOTE — Telephone Encounter (Signed)
Error. Pt called primary care for stomach pains//Stephanie Yates

## 2015-11-03 ENCOUNTER — Telehealth: Payer: Self-pay

## 2015-11-03 ENCOUNTER — Other Ambulatory Visit: Payer: Self-pay | Admitting: *Deleted

## 2015-11-03 MED ORDER — APIXABAN 2.5 MG PO TABS: 2.5000 mg | ORAL_TABLET | Freq: Two times a day (BID) | ORAL | Status: DC

## 2015-11-03 NOTE — Telephone Encounter (Signed)
Prior auth for Eliquis 2.5mg  sent to [facility].

## 2015-11-05 ENCOUNTER — Telehealth: Payer: Self-pay

## 2015-11-05 NOTE — Telephone Encounter (Signed)
Eliquis 2.5mg  approved through 06/26/2016. [id number]. Local pharmacy notified.

## 2016-01-13 ENCOUNTER — Encounter: Payer: Medicare PPO | Admitting: Nurse Practitioner

## 2016-01-17 ENCOUNTER — Emergency Department (HOSPITAL_COMMUNITY)
Admission: EM | Admit: 2016-01-17 | Discharge: 2016-01-17 | Disposition: A | Discharge status: Home / Self Care | Payer: Medicare Other | Attending: Emergency Medicine | Admitting: Emergency Medicine

## 2016-01-17 ENCOUNTER — Encounter (HOSPITAL_COMMUNITY): Payer: Self-pay

## 2016-01-17 DIAGNOSIS — Z7901 Long term (current) use of anticoagulants: Secondary | ICD-10-CM | POA: Insufficient documentation

## 2016-01-17 DIAGNOSIS — N183 Chronic kidney disease, stage 3 (moderate): Secondary | ICD-10-CM | POA: Insufficient documentation

## 2016-01-17 DIAGNOSIS — Z95810 Presence of automatic (implantable) cardiac defibrillator: Secondary | ICD-10-CM | POA: Diagnosis not present

## 2016-01-17 DIAGNOSIS — Z79899 Other long term (current) drug therapy: Secondary | ICD-10-CM | POA: Diagnosis not present

## 2016-01-17 DIAGNOSIS — Z87891 Personal history of nicotine dependence: Secondary | ICD-10-CM | POA: Diagnosis not present

## 2016-01-17 DIAGNOSIS — B029 Zoster without complications: Secondary | ICD-10-CM | POA: Insufficient documentation

## 2016-01-17 DIAGNOSIS — I130 Hypertensive heart and chronic kidney disease with heart failure and stage 1 through stage 4 chronic kidney disease, or unspecified chronic kidney disease: Secondary | ICD-10-CM | POA: Insufficient documentation

## 2016-01-17 DIAGNOSIS — I5022 Chronic systolic (congestive) heart failure: Secondary | ICD-10-CM | POA: Diagnosis not present

## 2016-01-17 DIAGNOSIS — R21 Rash and other nonspecific skin eruption: Secondary | ICD-10-CM | POA: Diagnosis present

## 2016-01-17 MED ORDER — HYDROCODONE-ACETAMINOPHEN 5-325 MG PO TABS: 1.0000 | ORAL_TABLET | Freq: Four times a day (QID) | ORAL | 0 refills | Status: DC | PRN

## 2016-01-17 MED ORDER — OXYCODONE-ACETAMINOPHEN 5-325 MG PO TABS
ORAL_TABLET | ORAL | Status: AC
  Filled 2016-01-17: qty 1

## 2016-01-17 MED ORDER — ACYCLOVIR 400 MG PO TABS: 400.0000 mg | ORAL_TABLET | Freq: Four times a day (QID) | ORAL | 0 refills | Status: DC

## 2016-01-17 MED ORDER — OXYCODONE-ACETAMINOPHEN 5-325 MG PO TABS
1.0000 | ORAL_TABLET | ORAL | Status: DC | PRN
  Administered 2016-01-17: 1 via ORAL

## 2016-01-17 NOTE — ED Triage Notes (Signed)
Per pt, pt started to have a pain that radiated from her back around to her stomach. Starting yesterday, pt reports blisters coming up where the pain has been. Upon assessment, pt has a papular rash noted on the left side of her body from the back to the abdomen.

## 2016-01-17 NOTE — ED Provider Notes (Signed)
[facility] DEPT Provider Note   CSN: [id number] Arrival date & time: 01/17/16  1547  First Provider Contact:  01/17/2016 5:05 PM    By signing my name below, I, [facility], attest that this documentation has been prepared under the direction and in the presence of Domenic Moras, PA-C. Electronically Signed: Virgel Bouquet, ED Scribe. 01/17/16. 5:11 PM.   History   Chief Complaint Chief Complaint  Patient presents with  . Rash    HPI Stephanie Yates is a 70 y.o. female who presents to the Emergency Department complaining of constant, gradually spreading, moderately painful rash to her left lower back onset 3 days ago. Pt states that her rash began on the left side of her back and spread to left sided abdomen.  Pain is mild to moderate.. She has not taken any medications or attempted any treatents Denies similar rashes in the past. She is unsure if she has had chicken pox in the past. Denies fevers, HA, visual changes, or difficulty swallowing.  The history is provided by the patient. No language interpreter was used.    Past Medical History:  Diagnosis Date  . Chronic renal insufficiency, stage III (moderate)    Cr 2.0 5/15  . Chronic systolic CHF (congestive heart failure) ([place])   . History of tobacco abuse   . Hyperlipidemia   . Hypertension   . Nonischemic cardiomyopathy ([place])    EF 10-15% on 11/2012 cath  . Sinus bradycardia 12/21/2012  . Ventricular tachycardia ([place]) 12/14/2012    Patient Active Problem List   Diagnosis Date Noted  . Atrial fibrillation ([place]) 04/01/2013  . ICD (implantable cardioverter-defibrillator), dual, st Judes 04/01/2013  . PVC's (premature ventricular contractions) 04/01/2013  . Hypokalemia 12/21/2012  . History of tobacco abuse 12/21/2012  . Elevated transaminase level 12/21/2012  . Sinus bradycardia 12/21/2012  . Hyperkalemia 12/20/2012  . Nonischemic cardiomyopathy ([place]) 12/20/2012  . UTI (urinary tract infection) 12/20/2012    . Chronic systolic heart failure ([place]) 12/20/2012  . Ventricular tachycardia ([place]) 12/14/2012  . Hypertension 12/14/2012    Past Surgical History:  Procedure Laterality Date  . IMPLANTABLE CARDIOVERTER DEFIBRILLATOR IMPLANT  12/19/2012   St. Jude dual-chamber ICD, serial number [id number]  . IMPLANTABLE CARDIOVERTER DEFIBRILLATOR IMPLANT N/A 12/19/2012   Procedure: IMPLANTABLE CARDIOVERTER DEFIBRILLATOR IMPLANT;  Surgeon: Deboraha Sprang, MD;  Location: [facility] CATH LAB;  Service: Cardiovascular;  Laterality: N/A;  . LEFT AND RIGHT HEART CATHETERIZATION WITH CORONARY ANGIOGRAM N/A 12/17/2012   Procedure: LEFT AND RIGHT HEART CATHETERIZATION WITH CORONARY ANGIOGRAM;  Surgeon: Sherren Mocha, MD;  Location: [facility] CATH LAB;  Service: Cardiovascular;  Laterality: N/A;    OB History    No data available       Home Medications    Prior to Admission medications   Medication Sig Start Date End Date Taking? Authorizing Provider  apixaban (ELIQUIS) 2.5 MG TABS tablet Take 1 tablet (2.5 mg total) by mouth 2 (two) times daily. 11/03/15   Deboraha Sprang, MD  carvedilol (COREG) 12.5 MG tablet Take 1 tablet by mouth 2 (two) times daily. 05/26/15   Historical Provider, MD  Cholecalciferol (VITAMIN D-3 PO) Take 1,000 Units by mouth 2 (two) times daily.     Historical Provider, MD  ciprofloxacin (CIPRO) 500 MG tablet Take 1 tablet by mouth 2 (two) times daily. For 7 days 05/29/15   Historical Provider, MD  furosemide (LASIX) 40 MG tablet Take 40 mg by mouth daily.    Historical Provider, MD  pantoprazole (PROTONIX) 40  MG tablet Take 40 mg by mouth daily.    Historical Provider, MD  simvastatin (ZOCOR) 20 MG tablet TAKE ONE TABLET BY MOUTH IN THE EVENING 12/15/14   Deboraha Sprang, MD  spironolactone (ALDACTONE) 25 MG tablet Take 0.5 tablets by mouth daily. 05/26/15   Historical Provider, MD    Family History No family history on file.  Social History Social History  Substance Use Topics  . Smoking status:  Former Research scientist (life sciences)  . Smokeless tobacco: Never Used  . Alcohol use No     Allergies   Strawberry extract   Review of Systems Review of Systems  Constitutional: Negative for fever.  HENT: Negative for trouble swallowing.   Eyes: Negative for visual disturbance.  Skin: Positive for color change and rash.  Neurological: Negative for headaches.     Physical Exam Updated Vital Signs BP 151/66 (BP Location: Left Arm)   Pulse 62   Temp 98.5 F (36.9 C) (Oral)   Resp 20   SpO2 99%   Physical Exam  Constitutional: She is oriented to person, place, and time. She appears well-developed and well-nourished. No distress.  HENT:  Head: Normocephalic and atraumatic.  Oral mucosa clear.  Eyes: Conjunctivae are normal.  Neck: Normal range of motion.  Cardiovascular: Normal rate and regular rhythm.   Heart normal  Pulmonary/Chest: Effort normal. No respiratory distress.  Lungs CTA bilaterally.  Musculoskeletal: Normal range of motion.  Neurological: She is alert and oriented to person, place, and time.  Skin: Skin is warm and dry. Rash noted. Rash is vesicular.  Vesicular rash noted to left lower lumbar region following a dermatomal T-12/L-1 pattern and consistent with shingles. Rash wraps to left lower panus.  Psychiatric: She has a normal mood and affect. Her behavior is normal.  Nursing note and vitals reviewed.    ED Treatments / Results   DIAGNOSTIC STUDIES: Oxygen Saturation is 99% on RA, normal by my interpretation.    COORDINATION OF CARE: 5:07 PM Discussed treatment plan with pt at bedside and pt agreed to plan.  5:16 PM Rash consistent with shingle.  Will prescribe acyclovir and vicodin.  Pt to f/u with pcp for further care.  Return precaution discussed.  Procedures Procedures   Medications Ordered in ED Medications  oxyCODONE-acetaminophen (PERCOCET/ROXICET) 5-325 MG per tablet 1 tablet (1 tablet Oral Given 01/17/16 1619)  oxyCODONE-acetaminophen (PERCOCET/ROXICET)  5-325 MG per tablet (not administered)     Initial Impression / Assessment and Plan / ED Course  I have reviewed the triage vital signs and the nursing notes.  Pertinent labs & imaging results that were available during my care of the patient were reviewed by me and considered in my medical decision making (see chart for details).  Clinical Course   BP 151/66 (BP Location: Left Arm)   Pulse 62   Temp 98.5 F (36.9 C) (Oral)   Resp 20   SpO2 99%    Final Clinical Impressions(s) / ED Diagnoses   Final diagnoses:  None    New Prescriptions New Prescriptions   ACYCLOVIR (ZOVIRAX) 400 MG TABLET    Take 1 tablet (400 mg total) by mouth 4 (four) times daily.   HYDROCODONE-ACETAMINOPHEN (NORCO/VICODIN) 5-325 MG TABLET    Take 1 tablet by mouth every 6 (six) hours as needed for moderate pain.   I personally performed the services described in this documentation, which was scribed in my presence. The recorded information has been reviewed and is accurate.       Gertie Fey  Haywood Pao 01/17/16 [place] Nguyen, MD 01/18/16 [phone]

## 2016-01-26 ENCOUNTER — Encounter: Payer: Self-pay | Admitting: Physician Assistant

## 2016-01-28 ENCOUNTER — Encounter: Payer: Self-pay | Admitting: Physician Assistant

## 2016-02-09 ENCOUNTER — Encounter (INDEPENDENT_AMBULATORY_CARE_PROVIDER_SITE_OTHER): Payer: Medicare Other | Admitting: Internal Medicine

## 2016-02-09 DIAGNOSIS — I480 Paroxysmal atrial fibrillation: Secondary | ICD-10-CM | POA: Diagnosis not present

## 2016-02-09 DIAGNOSIS — I429 Cardiomyopathy, unspecified: Secondary | ICD-10-CM | POA: Diagnosis not present

## 2016-02-09 DIAGNOSIS — I472 Ventricular tachycardia: Secondary | ICD-10-CM

## 2016-02-09 DIAGNOSIS — Z95810 Presence of automatic (implantable) cardiac defibrillator: Secondary | ICD-10-CM

## 2016-02-09 DIAGNOSIS — I5022 Chronic systolic (congestive) heart failure: Secondary | ICD-10-CM | POA: Diagnosis not present

## 2016-03-14 ENCOUNTER — Telehealth: Payer: Self-pay | Admitting: Internal Medicine

## 2016-03-14 DIAGNOSIS — R0602 Shortness of breath: Secondary | ICD-10-CM

## 2016-03-14 DIAGNOSIS — Z79899 Other long term (current) drug therapy: Secondary | ICD-10-CM

## 2016-03-14 MED ORDER — FUROSEMIDE 40 MG PO TABS: 40.0000 mg | ORAL_TABLET | Freq: Every day | ORAL | 6 refills | Status: DC

## 2016-03-14 NOTE — Telephone Encounter (Signed)
Called patient about her concerns. Patient noticed some swelling in her bilateral feet and ankles on Friday. Patient stated she has been having some SOB when lying down at night. Patient denies SOB with activity. Patient stated she has been taking all her medications, including her Lasix 40 mg daily. Asked patient about her BP. Patient stated she does not have a way to check her BP. Informed patient that DOD will be consulted.

## 2016-03-14 NOTE — Telephone Encounter (Signed)
Dr. Burt Knack (DOD) was consulted. He recommend patient to take 40 mg by mouth daily, after patient reported only taking her furosemide 20 mg (half tablet) by mouth Monday, Wednesday, and Friday. He would like patient to come in for lab work (BMET, BNP) and see PA this week. Appointments made for lab and office visit with Tommye Standard PA. Patient verbalized understanding and will start taking lasix 40 mg by mouth daily. Patient needed refill, sent refill to patient's pharmacy of choice.

## 2016-03-14 NOTE — Telephone Encounter (Signed)
New message   Pt c/o swelling: STAT is pt has developed SOB within 24 hours  1. How long have you been experiencing swelling?  Last friday 2. Where is the swelling located? Both feet/ankles  3.  Are you currently taking a "fluid pill"?  yes  4.  Are you currently SOB? Little when she lies down at night 5.  Have you traveled recently? no

## 2016-03-15 ENCOUNTER — Encounter: Payer: Self-pay | Admitting: Physician Assistant

## 2016-03-15 ENCOUNTER — Other Ambulatory Visit: Payer: Medicare Other

## 2016-03-15 NOTE — Progress Notes (Signed)
Cardiology Office Note Date:  03/16/2016  Patient ID:  Stephanie, Yates April 26, 1946, MRN [id number] PCP:  Henrine Screws, MD  Cardiologist:  Dr. Caryl Comes   Chief Complaint: c/o LE swelling, SOB  History of Present Illness: Stephanie Yates is a 70 y.o. female with history of NICM, VT w/ICD, PAFib, HTN, HLD, CRI stage III comes in to the office today to be seen for Dr. Caryl Comes.  Last seen by him in Dec doing well back on Aldatone.  She reports today that last week she started feeling DOE with some LE swelling, by Friday was worse, called in and was instructed to take a whole pill of her lasix rather then 1/2 and given this appointment.  Today is her 3rd day of the increased dose and her breathing is improved, no rest SOB, less DOE, no orthopnea or PND, her edema is improved but remains and mentions unable to get her regular or work shoes on without being uncomfortable.  No CP or palpitations, no dizziness, near syncope or syncope, no shocks.  She denies bleeding or signs of bleeding.   She would like a note to be off work until next week Thursday given she is on her feet most of the day and the swelling becomes very uncomfortable   Device information: SJM dual chamber ICD, implanted 12/19/12, primary prevention, Dr. Caryl Comes AAD tx: amiodarone (2014)   Past Medical History:  Diagnosis Date  . Chronic renal insufficiency, stage III (moderate)    Cr 2.0 5/15  . Chronic systolic CHF (congestive heart failure) ([place])   . History of tobacco abuse   . Hyperlipidemia   . Hypertension   . Nonischemic cardiomyopathy ([place])    EF 10-15% on 11/2012 cath  . Sinus bradycardia 12/21/2012  . Ventricular tachycardia ([place]) 12/14/2012    Past Surgical History:  Procedure Laterality Date  . IMPLANTABLE CARDIOVERTER DEFIBRILLATOR IMPLANT  12/19/2012   St. Jude dual-chamber ICD, serial number [id number]  . IMPLANTABLE CARDIOVERTER DEFIBRILLATOR IMPLANT N/A 12/19/2012   Procedure: IMPLANTABLE CARDIOVERTER  DEFIBRILLATOR IMPLANT;  Surgeon: Deboraha Sprang, MD;  Location: [facility] CATH LAB;  Service: Cardiovascular;  Laterality: N/A;  . LEFT AND RIGHT HEART CATHETERIZATION WITH CORONARY ANGIOGRAM N/A 12/17/2012   Procedure: LEFT AND RIGHT HEART CATHETERIZATION WITH CORONARY ANGIOGRAM;  Surgeon: Sherren Mocha, MD;  Location: [facility] CATH LAB;  Service: Cardiovascular;  Laterality: N/A;    Current Outpatient Prescriptions  Medication Sig Dispense Refill  . amiodarone (PACERONE) 200 MG tablet Take 200 mg by mouth daily.    Marland Kitchen apixaban (ELIQUIS) 2.5 MG TABS tablet Take 1 tablet (2.5 mg total) by mouth 2 (two) times daily. 60 tablet 3  . carvedilol (COREG) 12.5 MG tablet Take 1 tablet by mouth 2 (two) times daily.    . Cholecalciferol (VITAMIN D-3 PO) Take 1,000 Units by mouth 2 (two) times daily.     . ciprofloxacin (CIPRO) 500 MG tablet Take 1 tablet by mouth 2 (two) times daily. For 7 days    . furosemide (LASIX) 40 MG tablet Take 1 tablet (40 mg total) by mouth daily. 30 tablet 6  . pantoprazole (PROTONIX) 40 MG tablet Take 40 mg by mouth daily.    . simvastatin (ZOCOR) 20 MG tablet TAKE ONE TABLET BY MOUTH IN THE EVENING 30 tablet 5  . spironolactone (ALDACTONE) 25 MG tablet Take 0.5 tablets by mouth daily.     No current facility-administered medications for this visit.     Allergies:   Strawberry extract  Social History:  The patient  reports that she has quit smoking. She has never used smokeless tobacco. She reports that she does not drink alcohol or use drugs.   Family History:  The patient's family history includes Cancer in her mother. Family history is unclear, she thinks her mother died of bone cancer, no siblings.  ROS:  Please see the history of present illness.  All other systems are reviewed and otherwise negative.   PHYSICAL EXAM:  VS:  BP (!) 120/54   Pulse (!) 53   Ht 5\' 5"  (1.651 m)   Wt 149 lb (67.6 kg)   BMI 24.79 kg/m  BMI: Body mass index is 24.79 kg/m. Well nourished, well  developed, in no acute distress  HEENT: normocephalic, atraumatic  Neck: no JVD, carotid bruits or masses Cardiac: RRR; no significant murmurs, no rubs, or gallops Lungs:  clear to auscultation bilaterally, no wheezing, rhonchi or rales  Abd: soft, nontender MS: no deformity or atrophy Ext: 1++ edema b/l LE Skin: warm and dry, no rash Neuro:  No gross deficits appreciated Psych: euthymic mood, full affect  ICD site is stable, no tethering or discomfort  EKG:  Done today shows SB, 53bpm, LAD, IVCD, appears similar to older EKGs ICD interrogation today: normal device function, RV lead thresholds appear chronically elevated, no observations or detections, battery and leads status stable  04/19/13: TTE Study Conclusions - Left ventricle: The cavity size was normal. Wall thickness was increased in a pattern of mild LVH. Systolic function was moderately reduced. The estimated ejection fraction was in the range of 35% to 40%. Possible mild hypokinesis of the inferior myocardium. Doppler parameters are consistent with abnormal left ventricular relaxation (grade 1 diastolic dysfunction). - Mitral valve: Calcified annulus. Mildly thickened leaflets - Left atrium: The atrium was mildly dilated.  Recent Labs: 06/02/2015: ALT 98; BUN 27; Creat 1.20; Potassium 5.0; Sodium 135; TSH 2.149  No results found for requested labs within last 8760 hours.   CrCl cannot be calculated (Patient's most recent lab result is older than the maximum 21 days allowed.).   Wt Readings from Last 3 Encounters:  03/16/16 149 lb (67.6 kg)  06/02/15 157 lb (71.2 kg)  08/29/14 155 lb (70.3 kg)     Other studies reviewed: Additional studies/records reviewed today include: summarized above  ASSESSMENT AND PLAN:  1. NICM hx of VT w/ICD     Stable ICD function, RV thresholds appear chronically elevated, <1% RV pacing    Weight is down with the lasix, edema persists, [place] is up    On BB,  aldactone/lasix, no ACE/ARB given renal insufficiency  2. PAFib     CHA2DS2Vasc is at least 4 on Eliquis  3. HTN     stable   CMET, TSH, today The patient will give a call to let us know how she is doing next week (anticipate back to her baseline with clear improvement already) for a letter to return to work  Disposition: 2month in-clinic device check, patient declined remote monitoring, 6 mo with Dr. Caryl Comes sooner if needed.  The patient is due for CBC, not done today prior to her leaving we will ask her to come in to have a CBC done given her Eliquis at her convenience.  Current medicines are reviewed at length with the patient today.  The patient did not have any concerns regarding medicines.  Haywood Lasso, PA-C 03/16/2016 3:57 PM     [address]  [place] [address] [phone] (office)  [phone] (fax)

## 2016-03-16 ENCOUNTER — Other Ambulatory Visit: Payer: Medicare Other | Admitting: *Deleted

## 2016-03-16 ENCOUNTER — Encounter: Payer: Self-pay | Admitting: Physician Assistant

## 2016-03-16 ENCOUNTER — Ambulatory Visit (INDEPENDENT_AMBULATORY_CARE_PROVIDER_SITE_OTHER): Payer: Medicare Other | Admitting: Physician Assistant

## 2016-03-16 DIAGNOSIS — I5022 Chronic systolic (congestive) heart failure: Secondary | ICD-10-CM | POA: Diagnosis not present

## 2016-03-16 DIAGNOSIS — I420 Dilated cardiomyopathy: Secondary | ICD-10-CM

## 2016-03-16 DIAGNOSIS — I10 Essential (primary) hypertension: Secondary | ICD-10-CM | POA: Diagnosis not present

## 2016-03-16 DIAGNOSIS — R0602 Shortness of breath: Secondary | ICD-10-CM

## 2016-03-16 DIAGNOSIS — I472 Ventricular tachycardia, unspecified: Secondary | ICD-10-CM

## 2016-03-16 DIAGNOSIS — Z79899 Other long term (current) drug therapy: Secondary | ICD-10-CM

## 2016-03-16 DIAGNOSIS — I480 Paroxysmal atrial fibrillation: Secondary | ICD-10-CM

## 2016-03-16 LAB — TSH: TSH: 2.16 mIU/L

## 2016-03-16 NOTE — Patient Instructions (Addendum)
Medication Instructions:  Your physician recommends that you continue on your current medications as directed. Please refer to the Current Medication list given to you today.   Labwork: None Ordered   Testing/Procedures: None Ordered   Follow-Up: Your physician wants you to follow-up in: 3 months in the [facility] and 6 months with Dr. Caryl Comes.  You will receive a reminder letter in the mail two months in advance. If you don't receive a letter, please call our office to schedule the follow-up appointment.     Any Other Special Instructions Will Be Listed Below (If Applicable). Call the office next week to let us know how you are doing and status of ankle swelling.    If you need a refill on your cardiac medications before your next appointment, please call your pharmacy.

## 2016-03-17 LAB — HEPATIC FUNCTION PANEL
ALBUMIN: 2.9 g/dL — AB (ref 3.6–5.1)
ALT: 64 U/L — AB (ref 6–29)
AST: 137 U/L — ABNORMAL HIGH (ref 10–35)
Alkaline Phosphatase: 95 U/L (ref 33–130)
BILIRUBIN INDIRECT: 0.9 mg/dL (ref 0.2–1.2)
Bilirubin, Direct: 0.6 mg/dL — ABNORMAL HIGH (ref <=0.2)
Total Bilirubin: 1.5 mg/dL — ABNORMAL HIGH (ref 0.2–1.2)
Total Protein: 7.3 g/dL (ref 6.1–8.1)

## 2016-03-17 LAB — BASIC METABOLIC PANEL
BUN: 26 mg/dL — ABNORMAL HIGH (ref 7–25)
CHLORIDE: 104 mmol/L (ref 98–110)
CO2: 23 mmol/L (ref 20–31)
Calcium: 8.9 mg/dL (ref 8.6–10.4)
Creat: 1.6 mg/dL — ABNORMAL HIGH (ref 0.60–0.93)
Glucose, Bld: 105 mg/dL — ABNORMAL HIGH (ref 65–99)
POTASSIUM: 3.8 mmol/L (ref 3.5–5.3)
Sodium: 136 mmol/L (ref 135–146)

## 2016-03-17 LAB — BRAIN NATRIURETIC PEPTIDE: Brain Natriuretic Peptide: 367 pg/mL — ABNORMAL HIGH (ref <100)

## 2016-03-17 NOTE — Addendum Note (Signed)
Addended by: Claude Manges on: 03/17/2016 04:09 PM   Modules accepted: Orders

## 2016-03-18 ENCOUNTER — Ambulatory Visit: Payer: Medicare Other | Admitting: Physician Assistant

## 2016-03-18 ENCOUNTER — Other Ambulatory Visit: Payer: Self-pay | Admitting: Internal Medicine

## 2016-03-18 ENCOUNTER — Telehealth: Payer: Self-pay | Admitting: Internal Medicine

## 2016-03-18 NOTE — Telephone Encounter (Signed)
Reviewed the patient work situation with Stephanie Standard, PA. Per Joseph Art, the patient was having some swelling this week and requested to be out of work until next Thursday. Per Joseph Art, if the patient is doing ok she can RTW anytime if she feels ok. I attempted to call the patient- I left a message for her to call back.

## 2016-03-18 NOTE — Telephone Encounter (Signed)
F/u Message ° °Pt returning RN call. Please call back to discuss  °

## 2016-03-18 NOTE — Telephone Encounter (Signed)
New message     Pt wants to know if the nurse will write her a note to go back to work on Tuesday. Please call.

## 2016-03-18 NOTE — Telephone Encounter (Signed)
I left a message for the patient to call. 

## 2016-03-21 ENCOUNTER — Telehealth: Payer: Self-pay | Admitting: *Deleted

## 2016-03-21 ENCOUNTER — Other Ambulatory Visit: Payer: Self-pay | Admitting: *Deleted

## 2016-03-21 ENCOUNTER — Encounter: Payer: Self-pay | Admitting: *Deleted

## 2016-03-21 ENCOUNTER — Telehealth: Payer: Self-pay | Admitting: Internal Medicine

## 2016-03-21 DIAGNOSIS — I5022 Chronic systolic (congestive) heart failure: Secondary | ICD-10-CM

## 2016-03-21 NOTE — Telephone Encounter (Signed)
-----   Message from [place], [place] sent at 03/17/2016  5:59 PM EDT ----- Please let the patient know her liver function enzymes are elevated, she is on 2 medicines that can effect this, please have her hold the Pravachol for now and continue the amiodarone and repeat LFTs in 6 weeks.  Please forward to her PMD as well please.  Thyroid test is normal.  Her kidney function is a little lower with the whole lasix pill, please have her go to alternating days with 1/2 pill since her swelling and SOB were improving, repeat BMET with her other labs draw in 6 weeks please  Thanks Joseph Art

## 2016-03-21 NOTE — Telephone Encounter (Signed)
I called and spoke with the patient. She is aware, that per [place], [place], she may return to work when she feels ready. She states that the swelling in her feet has gone down significantly and she can get her work shoes on. She will come by tomorrow to pick up her RTW note.

## 2016-03-21 NOTE — Telephone Encounter (Signed)
New message     Pt wants to get a letter allowing her to go back to work on Wednesday. Please call.

## 2016-03-21 NOTE — Telephone Encounter (Signed)
-----   Message from [place], [place] sent at 03/17/2016  5:59 PM EDT ----- Please let the patient know her liver function enzymes are elevated, she is on 2 medicines that can effect this, please have her hold the Pravachol for now and continue the amiodarone and repeat LFTs in 6 weeks.  Please forward to her PMD as well please.  Thyroid test is normal.  Her kidney function is a little lower with the whole lasix pill, please have her go to alternating days with 1/2 pill since her swelling and SOB were improving, repeat BMET with her other labs draw in 6 weeks please  Thanks Joseph Art

## 2016-03-21 NOTE — Telephone Encounter (Signed)
SPOKE TO PT ABOUT LABS RESULTS AND  MEDICATION CHANGES WHICH ARE START TAKING 1/2 OF 40 MG LASIX  EOD WHILE TAKE WHOLE TABLET 40 MG  ON OPPOSITE DAYS ,  TO HOLD FOR NOW ZOCOR NOT PRAVACHOL PER  CALL DISCUSSION FROM [facility] AND CONTINUE TAKING  AMIODARONE. PT AGREED TO RETURN ON 05/02/16 IN 6 WEEKS FOR  REPEAT LFT AND BMET.. PT ALSO MENTIONED ABOUT WORK NOTE TO BE ABLE TO RETURN WEDNESDAY  03/23/16.

## 2016-04-11 ENCOUNTER — Encounter: Payer: Self-pay | Admitting: Internal Medicine

## 2016-05-02 ENCOUNTER — Other Ambulatory Visit: Payer: Medicare Other

## 2016-06-07 ENCOUNTER — Other Ambulatory Visit: Payer: Self-pay | Admitting: Internal Medicine

## 2016-06-07 ENCOUNTER — Telehealth: Payer: Self-pay | Admitting: Internal Medicine

## 2016-06-07 DIAGNOSIS — I480 Paroxysmal atrial fibrillation: Secondary | ICD-10-CM

## 2016-06-07 DIAGNOSIS — I5022 Chronic systolic (congestive) heart failure: Secondary | ICD-10-CM

## 2016-06-07 MED ORDER — AMIODARONE HCL 200 MG PO TABS: 200.0000 mg | ORAL_TABLET | Freq: Every day | ORAL | 3 refills | Status: DC

## 2016-06-07 NOTE — Telephone Encounter (Signed)
Pt's Rx sent to pt's pharmacy as requested. Confirmation.

## 2016-06-07 NOTE — Telephone Encounter (Signed)
New message  1. amiodarone 200mg  1 daily 2. [facility]/[address] 3. 90 day supply

## 2016-06-30 ENCOUNTER — Ambulatory Visit (INDEPENDENT_AMBULATORY_CARE_PROVIDER_SITE_OTHER): Payer: Medicare Other | Admitting: *Deleted

## 2016-06-30 DIAGNOSIS — I5022 Chronic systolic (congestive) heart failure: Secondary | ICD-10-CM | POA: Diagnosis not present

## 2016-06-30 DIAGNOSIS — I428 Other cardiomyopathies: Secondary | ICD-10-CM | POA: Diagnosis not present

## 2016-06-30 LAB — CUP PACEART INCLINIC DEVICE CHECK
Brady Statistic RV Percent Paced: 0.15 %
Date Time Interrogation Session: 20180104161943
HIGH POWER IMPEDANCE MEASURED VALUE: 58.5 Ohm
Implantable Lead Implant Date: 20140625
Implantable Lead Location: 753860
Implantable Pulse Generator Implant Date: 20140625
Lead Channel Impedance Value: 425 Ohm
Lead Channel Pacing Threshold Amplitude: 0.75 V
Lead Channel Pacing Threshold Amplitude: 1.75 V
Lead Channel Pacing Threshold Pulse Width: 0.9 ms
Lead Channel Pacing Threshold Pulse Width: 0.9 ms
Lead Channel Sensing Intrinsic Amplitude: 3.1 mV
Lead Channel Setting Pacing Amplitude: 3 V
MDC IDC LEAD IMPLANT DT: 20140625
MDC IDC LEAD LOCATION: 753859
MDC IDC MSMT LEADCHNL RA PACING THRESHOLD AMPLITUDE: 0.75 V
MDC IDC MSMT LEADCHNL RA PACING THRESHOLD PULSEWIDTH: 0.5 ms
MDC IDC MSMT LEADCHNL RA PACING THRESHOLD PULSEWIDTH: 0.5 ms
MDC IDC MSMT LEADCHNL RV IMPEDANCE VALUE: 512.5 Ohm
MDC IDC MSMT LEADCHNL RV PACING THRESHOLD AMPLITUDE: 1.75 V
MDC IDC MSMT LEADCHNL RV SENSING INTR AMPL: 7.7 mV
MDC IDC PG SERIAL: 7093938
MDC IDC SET LEADCHNL RA PACING AMPLITUDE: 2 V
MDC IDC SET LEADCHNL RV PACING PULSEWIDTH: 0.9 ms
MDC IDC SET LEADCHNL RV SENSING SENSITIVITY: 0.5 mV
MDC IDC STAT BRADY RA PERCENT PACED: 55 %

## 2016-06-30 MED ORDER — CARVEDILOL 12.5 MG PO TABS: 12.5000 mg | ORAL_TABLET | Freq: Two times a day (BID) | ORAL | 0 refills | Status: DC

## 2016-06-30 NOTE — Progress Notes (Signed)
ICD check in clinic. Normal device function. Thresholds and sensing consistent with previous device measurements. Impedance trends stable over time. No evidence of any ventricular arrhythmias. No mode switches. Histogram distribution appropriate for patient and level of activity. Stable thoracic impedance. No changes made this session. Device programmed at appropriate safety margins. Device programmed to optimize intrinsic conduction. Estimated longevity 3.7-5.3 years. Pt will follow up with SK in 3 months. Patient education completed including shock plan. Vibration demonstrated for patient.

## 2016-07-04 ENCOUNTER — Telehealth: Payer: Self-pay | Admitting: Internal Medicine

## 2016-07-04 NOTE — Telephone Encounter (Signed)
New Message      Do you have any samples of Eliquis? She was told to call back today to check

## 2016-07-04 NOTE — Telephone Encounter (Signed)
I have attempted to reach patient to let her know that we do not have any samples. I did not get an answer and her voicemail is unidentified.

## 2016-07-07 ENCOUNTER — Emergency Department (HOSPITAL_COMMUNITY): Payer: Medicare Other

## 2016-07-07 ENCOUNTER — Encounter (HOSPITAL_COMMUNITY): Payer: Self-pay

## 2016-07-07 ENCOUNTER — Emergency Department (HOSPITAL_COMMUNITY)
Admission: EM | Admit: 2016-07-07 | Discharge: 2016-07-07 | Disposition: A | Discharge status: Home / Self Care | Payer: Medicare Other | Attending: Emergency Medicine | Admitting: Emergency Medicine

## 2016-07-07 DIAGNOSIS — Z87891 Personal history of nicotine dependence: Secondary | ICD-10-CM | POA: Diagnosis not present

## 2016-07-07 DIAGNOSIS — N183 Chronic kidney disease, stage 3 (moderate): Secondary | ICD-10-CM | POA: Insufficient documentation

## 2016-07-07 DIAGNOSIS — Z95810 Presence of automatic (implantable) cardiac defibrillator: Secondary | ICD-10-CM | POA: Diagnosis not present

## 2016-07-07 DIAGNOSIS — Z79899 Other long term (current) drug therapy: Secondary | ICD-10-CM | POA: Insufficient documentation

## 2016-07-07 DIAGNOSIS — Z7901 Long term (current) use of anticoagulants: Secondary | ICD-10-CM | POA: Insufficient documentation

## 2016-07-07 DIAGNOSIS — Y999 Unspecified external cause status: Secondary | ICD-10-CM | POA: Diagnosis not present

## 2016-07-07 DIAGNOSIS — Y929 Unspecified place or not applicable: Secondary | ICD-10-CM | POA: Diagnosis not present

## 2016-07-07 DIAGNOSIS — I130 Hypertensive heart and chronic kidney disease with heart failure and stage 1 through stage 4 chronic kidney disease, or unspecified chronic kidney disease: Secondary | ICD-10-CM | POA: Diagnosis not present

## 2016-07-07 DIAGNOSIS — S8001XA Contusion of right knee, initial encounter: Secondary | ICD-10-CM | POA: Diagnosis not present

## 2016-07-07 DIAGNOSIS — Y939 Activity, unspecified: Secondary | ICD-10-CM | POA: Diagnosis not present

## 2016-07-07 DIAGNOSIS — I5022 Chronic systolic (congestive) heart failure: Secondary | ICD-10-CM | POA: Insufficient documentation

## 2016-07-07 DIAGNOSIS — W228XXA Striking against or struck by other objects, initial encounter: Secondary | ICD-10-CM | POA: Insufficient documentation

## 2016-07-07 DIAGNOSIS — M25461 Effusion, right knee: Secondary | ICD-10-CM

## 2016-07-07 DIAGNOSIS — S8991XA Unspecified injury of right lower leg, initial encounter: Secondary | ICD-10-CM | POA: Diagnosis present

## 2016-07-07 MED ORDER — HYDROCODONE-ACETAMINOPHEN 5-325 MG PO TABS: 1.0000 | ORAL_TABLET | Freq: Four times a day (QID) | ORAL | 0 refills | Status: DC | PRN

## 2016-07-07 NOTE — ED Notes (Signed)
Patient transported to X-ray 

## 2016-07-07 NOTE — ED Provider Notes (Signed)
[facility] DEPT Provider Note   CSN: [id number] Arrival date & time: 07/07/16  1719   By signing my name below, I, Neta Mends, attest that this documentation has been prepared under the direction and in the presence of Kalila Adkison, PA-C. Electronically Signed: Neta Mends, ED Scribe. 07/07/2016. 6:24 PM.   History   Chief Complaint Chief Complaint  Patient presents with  . Knee Injury    The history is provided by the patient. No language interpreter was used.   HPI Comments:  Stephanie Yates is a 71 y.o. female who presents to the Emergency Department s/p right knee injury that occurred earlier today. Pt reports that she hit her right knee on the bathroom door. Pt complains of associated pain and swelling to her right knee, and rates her current pain at 10/10. Pt is ambulatory but with moderate pain. Pt took hydrocodone with no relief. Pt denies any other recent injury/trauma. Pt denies numbness.   Past Medical History:  Diagnosis Date  . Chronic renal insufficiency, stage III (moderate)    Cr 2.0 5/15  . Chronic systolic CHF (congestive heart failure) ([place])   . History of tobacco abuse   . Hyperlipidemia   . Hypertension   . Nonischemic cardiomyopathy ([place])    EF 10-15% on 11/2012 cath  . Sinus bradycardia 12/21/2012  . Ventricular tachycardia ([place]) 12/14/2012    Patient Active Problem List   Diagnosis Date Noted  . Atrial fibrillation ([place]) 04/01/2013  . ICD (implantable cardioverter-defibrillator), dual, st Judes 04/01/2013  . PVC's (premature ventricular contractions) 04/01/2013  . Hypokalemia 12/21/2012  . History of tobacco abuse 12/21/2012  . Elevated transaminase level 12/21/2012  . Sinus bradycardia 12/21/2012  . Hyperkalemia 12/20/2012  . Nonischemic cardiomyopathy ([place]) 12/20/2012  . UTI (urinary tract infection) 12/20/2012  . Chronic systolic heart failure ([place]) 12/20/2012  . Ventricular tachycardia ([place]) 12/14/2012  . Hypertension  12/14/2012    Past Surgical History:  Procedure Laterality Date  . IMPLANTABLE CARDIOVERTER DEFIBRILLATOR IMPLANT  12/19/2012   St. Jude dual-chamber ICD, serial number [id number]  . IMPLANTABLE CARDIOVERTER DEFIBRILLATOR IMPLANT N/A 12/19/2012   Procedure: IMPLANTABLE CARDIOVERTER DEFIBRILLATOR IMPLANT;  Surgeon: Deboraha Sprang, MD;  Location: [facility] CATH LAB;  Service: Cardiovascular;  Laterality: N/A;  . LEFT AND RIGHT HEART CATHETERIZATION WITH CORONARY ANGIOGRAM N/A 12/17/2012   Procedure: LEFT AND RIGHT HEART CATHETERIZATION WITH CORONARY ANGIOGRAM;  Surgeon: Sherren Mocha, MD;  Location: [facility] CATH LAB;  Service: Cardiovascular;  Laterality: N/A;    OB History    No data available       Home Medications    Prior to Admission medications   Medication Sig Start Date End Date Taking? Authorizing Provider  amiodarone (PACERONE) 200 MG tablet Take 1 tablet (200 mg total) by mouth daily. 06/07/16   Deboraha Sprang, MD  carvedilol (COREG) 12.5 MG tablet Take 1 tablet (12.5 mg total) by mouth 2 (two) times daily. 06/30/16   Deboraha Sprang, MD  Cholecalciferol (VITAMIN D-3 PO) Take 1,000 Units by mouth 2 (two) times daily.     Historical Provider, MD  ciprofloxacin (CIPRO) 500 MG tablet Take 1 tablet by mouth 2 (two) times daily. For 7 days 05/29/15   Historical Provider, MD  ELIQUIS 2.5 MG TABS tablet TAKE ONE TABLET BY MOUTH TWICE DAILY 03/18/16   Deboraha Sprang, MD  furosemide (LASIX) 40 MG tablet Take 1 tablet (40 mg total) by mouth daily. 03/14/16   Sherren Mocha, MD  pantoprazole ([place]) 40  MG tablet Take 40 mg by mouth daily.    Historical Provider, MD  simvastatin (ZOCOR) 20 MG tablet TAKE ONE TABLET BY MOUTH IN THE EVENING Patient not taking: Reported on 06/30/2016 12/15/14   Deboraha Sprang, MD  spironolactone (ALDACTONE) 25 MG tablet Take 0.5 tablets by mouth daily. 05/26/15   Historical Provider, MD    Family History Family History  Problem Relation Age of Onset  . Cancer Mother      Social History Social History  Substance Use Topics  . Smoking status: Former Research scientist (life sciences)  . Smokeless tobacco: Never Used  . Alcohol use No     Allergies   Strawberry extract   Review of Systems Review of Systems  Constitutional: Negative for chills and fever.  Musculoskeletal: Positive for arthralgias and joint swelling.  Neurological: Negative for numbness.     Physical Exam Updated Vital Signs BP 123/67 (BP Location: Left Arm)   Pulse (!) 52   Temp 97.8 F (36.6 C) (Oral)   Resp 16   Ht 5\' 6"  (1.676 m)   Wt 180 lb (81.6 kg)   SpO2 98%   BMI 29.05 kg/m   Physical Exam  Constitutional: She is oriented to person, place, and time. She appears well-developed and well-nourished. No distress.  HENT:  Head: Normocephalic and atraumatic.  Eyes: Conjunctivae are normal.  Cardiovascular: Normal rate.   Pulmonary/Chest: Effort normal.  Abdominal: She exhibits no distension.  Musculoskeletal:  Joint effusion noted to the right knee. Tenderness to palpation over distal quadriceps and lateral knee joint. No tenderness over medial joint. Full range of motion of the knee. Pain with knee extension. Negative anterior and posterior drawer signs. Velocity mediolateral stress. Dorsal pedal pulses intact. Normal ankle.  Neurological: She is alert and oriented to person, place, and time.  Skin: Skin is warm and dry.  Psychiatric: She has a normal mood and affect.  Nursing note and vitals reviewed.    ED Treatments / Results  DIAGNOSTIC STUDIES:  Oxygen Saturation is 98% on RA, normal by my interpretation.    COORDINATION OF CARE:  6:24 PM Discussed treatment plan with pt at bedside and pt agreed to plan.   Labs (all labs ordered are listed, but only abnormal results are displayed) Labs Reviewed - No data to display  EKG  EKG Interpretation None       Radiology Dg Knee Complete 4 Views Right  Result Date: 07/07/2016 CLINICAL DATA:  Hit right knee against a door  now with pain at the lateral knee CT EXAM: RIGHT KNEE - COMPLETE 4+ VIEW COMPARISON:  None. FINDINGS: Bones are diffusely demineralized. Tricompartmental degenerative spurring noted with loss of joint space in medial compartment. Joint effusion evident in the suprapatellar bursa. No evidence of fracture. No subluxation or dislocation. IMPRESSION: Degenerative changes and joint effusion without acute bony findings. Electronically Signed   By: Misty Stanley M.D.   On: 07/07/2016 19:10    Procedures Procedures (including critical care time)  Medications Ordered in ED Medications - No data to display   Initial Impression / Assessment and Plan / ED Course  I have reviewed the triage vital signs and the nursing notes.  Pertinent labs & imaging results that were available during my care of the patient were reviewed by me and considered in my medical decision making (see chart for details).  Clinical Course     Patient in emergency department with right knee pain and swelling after hitting it with a door. I'm concerned based  on my exam for proximal patella tendon injury. Doubt full rupture given no deformity on exam and no high riding patella on the x-ray. She does have a joint effusion on the x-ray, otherwise it is negative. Provided her with an Ace wrap. She is asking for refill of her hydrocodone for pain at home. I will give her 10 tablets. Instructed to keep her knee elevated, ice, follow up with primary care doctor but improving for further imaging and treatment. Patient voiced understanding. She is ambulatory, stable for discharge home, neurovascular intact at the time of discharge.  Vitals:   07/07/16 1805 07/07/16 1809 07/07/16 1924  BP: 123/67  147/88  Pulse: (!) 52  (!) 50  Resp: 16  18  Temp: 97.8 F (36.6 C)  98.6 F (37 C)  TempSrc: Oral  Oral  SpO2: 98%  100%  Weight:  81.6 kg   Height:  5\' 6"  (1.676 m)        Final Clinical Impressions(s) / ED Diagnoses   Final  diagnoses:  Contusion of right knee, initial encounter  Effusion of right knee    New Prescriptions Discharge Medication List as of 07/07/2016  7:23 PM    START taking these medications   Details  HYDROcodone-acetaminophen (NORCO) 5-325 MG tablet Take 1 tablet by mouth every 6 (six) hours as needed., Starting Thu 07/07/2016, Print       I personally performed the services described in this documentation, which was scribed in my presence. The recorded information has been reviewed and is accurate.     Jeannett Senior, PA-C 07/07/16 [place], MD 07/07/16 [phone]

## 2016-07-07 NOTE — ED Triage Notes (Signed)
Per Pt, pt is coming from home with complaints of knee injury. Pt was at work and hit her knee. Denies fall or any other injury. Swelling noted to the right knee.

## 2016-07-07 NOTE — Discharge Instructions (Signed)
Your xray is normal today. Ice your knee several times a day. Keep elevated. Take tylenol for pain. Take norco for severe pain only. Ace wrap for compression and swelling. Follow up with family doctor if not improving by Monday.

## 2016-07-12 ENCOUNTER — Emergency Department (HOSPITAL_COMMUNITY): Payer: Medicare Other

## 2016-07-12 ENCOUNTER — Encounter (HOSPITAL_COMMUNITY): Payer: Self-pay

## 2016-07-12 ENCOUNTER — Emergency Department (HOSPITAL_COMMUNITY)
Admission: EM | Admit: 2016-07-12 | Discharge: 2016-07-12 | Disposition: A | Discharge status: Home / Self Care | Payer: Medicare Other | Attending: Emergency Medicine | Admitting: Emergency Medicine

## 2016-07-12 DIAGNOSIS — Z95810 Presence of automatic (implantable) cardiac defibrillator: Secondary | ICD-10-CM | POA: Diagnosis not present

## 2016-07-12 DIAGNOSIS — Z87891 Personal history of nicotine dependence: Secondary | ICD-10-CM | POA: Diagnosis not present

## 2016-07-12 DIAGNOSIS — I110 Hypertensive heart disease with heart failure: Secondary | ICD-10-CM | POA: Diagnosis not present

## 2016-07-12 DIAGNOSIS — I5022 Chronic systolic (congestive) heart failure: Secondary | ICD-10-CM | POA: Diagnosis not present

## 2016-07-12 DIAGNOSIS — M25561 Pain in right knee: Secondary | ICD-10-CM | POA: Diagnosis present

## 2016-07-12 DIAGNOSIS — Z7901 Long term (current) use of anticoagulants: Secondary | ICD-10-CM | POA: Diagnosis not present

## 2016-07-12 DIAGNOSIS — M25461 Effusion, right knee: Secondary | ICD-10-CM

## 2016-07-12 DIAGNOSIS — W228XXD Striking against or struck by other objects, subsequent encounter: Secondary | ICD-10-CM | POA: Insufficient documentation

## 2016-07-12 LAB — SYNOVIAL CELL COUNT + DIFF, W/ CRYSTALS
Crystals, Fluid: NONE SEEN
EOSINOPHILS-SYNOVIAL: 0 % (ref 0–1)
Lymphocytes-Synovial Fld: 34 % — ABNORMAL HIGH (ref 0–20)
Monocyte-Macrophage-Synovial Fluid: 16 % — ABNORMAL LOW (ref 50–90)
Neutrophil, Synovial: 50 % — ABNORMAL HIGH (ref 0–25)
WBC, SYNOVIAL: 3063 /mm3 — AB (ref 0–200)

## 2016-07-12 MED ORDER — ONDANSETRON 4 MG PO TBDP
4.0000 mg | ORAL_TABLET | Freq: Once | ORAL | Status: AC
  Administered 2016-07-12: 4 mg via ORAL
  Filled 2016-07-12: qty 1

## 2016-07-12 MED ORDER — LIDOCAINE-EPINEPHRINE (PF) 2 %-1:200000 IJ SOLN
10.0000 mL | Freq: Once | INTRAMUSCULAR | Status: AC
  Administered 2016-07-12: 10 mL
  Filled 2016-07-12: qty 20

## 2016-07-12 MED ORDER — OXYCODONE-ACETAMINOPHEN 5-325 MG PO TABS
1.0000 | ORAL_TABLET | Freq: Once | ORAL | Status: AC
  Administered 2016-07-12: 1 via ORAL
  Filled 2016-07-12: qty 1

## 2016-07-12 NOTE — ED Notes (Signed)
Ortho tech called and said that he will be down soon to apply knee sleeve.

## 2016-07-12 NOTE — ED Notes (Signed)
EDP at bedside doing knee aspiration.

## 2016-07-12 NOTE — Progress Notes (Signed)
Orthopedic Tech Progress Note Patient Details:  MAGDALENE TARDIFF 1946-05-25 [id number]  Ortho Devices Type of Ortho Device: Knee Sleeve Ortho Device/Splint Location: Applied Knee Sleeve to Right Leg/knee. Pt Tolerated well. Family at bedside.  (Right Leg/Knee).  Nurse notified when completed. Ortho Device/Splint Interventions: Application   Kristopher Oppenheim 07/12/2016, 2:38 PM

## 2016-07-12 NOTE — ED Notes (Signed)
I sent down synovial fluid (by Gerald Stabs, EMT) to main lab at 13:40pm.  Computer system was down and he was asked to leave it in bin at front window and that it will be processed as soon as possible.  I called main lab at 15:20 to see why it was taking so long.  They told me that they could not find it.  Gerald Stabs, EMT walked to lab again and told them where he had left it.  It was then found and processed.

## 2016-07-12 NOTE — ED Notes (Signed)
Pt given peanut butter and crackers to eat after zofran takes effect.

## 2016-07-12 NOTE — ED Notes (Signed)
Paged ortho tech for knee sleeve.

## 2016-07-12 NOTE — ED Notes (Signed)
On way to CT 

## 2016-07-12 NOTE — Discharge Instructions (Signed)
You ct shows no signs of fracture. You need to keep the knee sleeve on. Please rest, ice, elevate your right knee. Use crutches as needed. Please call the orthopedist tomorrow to schedule an appointment. Return to the ED if you notices any signs of infection including redness, warmth, discharge from her right knee or fevers.

## 2016-07-12 NOTE — ED Notes (Signed)
Sterile towels, betadine, syringes, and needles at bedside for knee aspiration.

## 2016-07-12 NOTE — ED Notes (Signed)
Consent form for procedure completed

## 2016-07-12 NOTE — ED Provider Notes (Signed)
[facility] DEPT Provider Note   CSN: [id number] Arrival date & time: 07/12/16  7048     History   Chief Complaint Chief Complaint  Patient presents with  . Knee Pain    HPI Stephanie Yates is a 71 y.o. female.  HPI   interpreter was used.   HPI Comments: Stephanie Yates is a 71 y.o. female who presents to the Emergency Department s/p right knee injury that occurred 1/11. Pt reports that she hit her right knee on the bathroom door. Pt complains of associated pain and swelling to her right knee, and rates her current pain at 10/10. Patient was seen in the ED on 1/11 for same. She had x-rays are negative for fracture that time. There was a small joint effusion noted. She was discharged home with Ace wrap and pain medicine. Provider that time was worried about a proximal patella tendon injury. The patient states that the pain has increased along with the swelling. She is able to bear weight. She has minimal range of motion. She has not followed up with her PCP or orthopedist. Pt is ambulatory but with moderate pain. Pt took hydrocodone with no relief. Patient denies any fevers, warmth, erythema, paresthesias or any other associated symptoms. Past Medical History:  Diagnosis Date  . Chronic renal insufficiency, stage III (moderate)    Cr 2.0 5/15  . Chronic systolic CHF (congestive heart failure) ([place])   . History of tobacco abuse   . Hyperlipidemia   . Hypertension   . Nonischemic cardiomyopathy ([place])    EF 10-15% on 11/2012 cath  . Sinus bradycardia 12/21/2012  . Ventricular tachycardia ([place]) 12/14/2012    Patient Active Problem List   Diagnosis Date Noted  . Atrial fibrillation ([place]) 04/01/2013  . ICD (implantable cardioverter-defibrillator), dual, [employer] 04/01/2013  . PVC's (premature ventricular contractions) 04/01/2013  . Hypokalemia 12/21/2012  . History of tobacco abuse 12/21/2012  . Elevated transaminase level 12/21/2012  . Sinus bradycardia 12/21/2012  .  Hyperkalemia 12/20/2012  . Nonischemic cardiomyopathy ([place]) 12/20/2012  . UTI (urinary tract infection) 12/20/2012  . Chronic systolic heart failure ([place]) 12/20/2012  . Ventricular tachycardia ([place]) 12/14/2012  . Hypertension 12/14/2012    Past Surgical History:  Procedure Laterality Date  . IMPLANTABLE CARDIOVERTER DEFIBRILLATOR IMPLANT  12/19/2012   St[employer] Jude dual-chamber ICD, serial number [id number]  . IMPLANTABLE CARDIOVERTER DEFIBRILLATOR IMPLANT N/A 12/19/2012   Procedure: IMPLANTABLE CARDIOVERTER DEFIBRILLATOR IMPLANT;  Surgeon: Deboraha Sprang, MD;  Location[facility] CATH LAB;  Service: Cardiovascular;  Laterality: N/A;  . LEFT AND RIGHT HEART CATHETERIZATION WITH CORONARY ANGIOGRAM N/A 12/17/2012   Procedure: LEFT AND RIGHT HEART CATHETERIZATION WITH CORONARY ANGIOGRAM;  Surgeon: Sherren Mocha, MD;  Location: [facility] CATH LAB;  Service: Cardiovascular;  Laterality: N/A;    OB History    No data available       Home Medications    Prior to Admission medications   Medication Sig Start Date End Date Taking? Authorizing Provider  amiodarone (PACERONE) 200 MG tablet Take 1 tablet (200 mg total) by mouth daily. 06/07/16  Yes Deboraha Sprang, MD  carvedilol (COREG) 12.5 MG tablet Take 1 tablet (12.5 mg total) by mouth 2 (two) times daily. 06/30/16  Yes Deboraha Sprang, MD  Cholecalciferol (VITAMIN D-3 PO) Take 1,000 Units by mouth 2 (two) times daily.    Yes Historical Provider, MD  ELIQUIS 2.5 MG TABS tablet TAKE ONE TABLET BY MOUTH TWICE DAILY 03/18/16  Yes Deboraha Sprang, MD  furosemide (  LASIX) 40 MG tablet Take 1 tablet (40 mg total) by mouth daily. 03/14/16  Yes Sherren Mocha, MD  HYDROcodone-acetaminophen (NORCO) 5-325 MG tablet Take 1 tablet by mouth every 6 (six) hours as needed. Patient taking differently: Take 1 tablet by mouth every 6 (six) hours as needed for moderate pain.  07/07/16  Yes Tatyana Kirichenko, PA-C  spironolactone (ALDACTONE) 25 MG tablet Take 12.5 mg by mouth daily.   05/26/15  Yes Historical Provider, MD  simvastatin (ZOCOR) 20 MG tablet TAKE ONE TABLET BY MOUTH IN THE EVENING Patient not taking: Reported on 07/12/2016 12/15/14   Deboraha Sprang, MD    Family History Family History  Problem Relation Age of Onset  . Cancer Mother     Social History Social History  Substance Use Topics  . Smoking status: Former Research scientist (life sciences)  . Smokeless tobacco: Never Used  . Alcohol use No     Allergies   Strawberry extract   Review of Systems Review of Systems  Constitutional: Negative for chills and fever.  HENT: Negative for congestion.   Musculoskeletal: Positive for arthralgias, gait problem and joint swelling.  Skin: Negative for color change and wound.  Neurological: Negative for weakness and numbness.  All other systems reviewed and are negative.    Physical Exam Updated Vital Signs BP 157/66   Pulse (!) 50   Temp 97.6 F (36.4 C) (Oral)   Resp 18   Ht 5\' 6"  (1.676 m)   Wt 81.6 kg   SpO2 100%   BMI 29.05 kg/m   Physical Exam  Constitutional: She appears well-developed and well-nourished. No distress.  Eyes: Right eye exhibits no discharge. Left eye exhibits no discharge. No scleral icterus.  Pulmonary/Chest: No respiratory distress.  Musculoskeletal:       Right knee: She exhibits decreased range of motion, swelling, effusion, ecchymosis and bony tenderness. She exhibits no deformity, no erythema, normal alignment and no LCL laxity. Tenderness found. Medial joint line, lateral joint line and patellar tendon tenderness noted.  Joint effusion noted to the right lateral knee. Tenderness to palpation of the medial and lateral knee joint. She does have mild tenderness over the patella tendon. Limited range of motion due to pain. Negative joint laxity with anterior posterior drawer. There is no laxity with valgus or varus stress. DP pulses are 2+ bilaterally. Strength is 5 out of 5 in lower extremities. Sensation intact.  Neurological: She is alert.   Skin: No pallor.  Nursing note and vitals reviewed.    ED Treatments / Results  Labs (all labs ordered are listed, but only abnormal results are displayed) Labs Reviewed  SYNOVIAL CELL COUNT + DIFF, W/ CRYSTALS - Abnormal; Notable for the following:       Result Value   Color, Synovial RED (*)    Appearance-Synovial TURBID (*)    WBC, Synovial 3,063 (*)    Neutrophil, Synovial 50 (*)    Lymphocytes-Synovial Fld 34 (*)    Monocyte-Macrophage-Synovial Fluid 16 (*)    All other components within normal limits  BODY FLUID CULTURE    EKG  EKG Interpretation None       Radiology Ct Knee Right Wo Contrast  Result Date: 07/12/2016 CLINICAL DATA:  Right knee pain after hitting knee against door on Thursday. EXAM: CT OF THE RIGHT KNEE WITHOUT CONTRAST TECHNIQUE: Multidetector CT imaging of the RIGHT knee was performed according to the standard protocol. Multiplanar CT image reconstructions were also generated. COMPARISON:  07/12/2016 radiographs. FINDINGS: Bones/Joint/Cartilage No acute fracture  noted. Subchondral sclerosis, cysts and/or erosions as well as chondromalacia of the patellofemoral compartment moreso laterally likely on the basis of osteoarthritis with remodeled appearance of the lateral patellar facet. Moderate joint effusion is identified without fat fluid level. There is subtle hyperdensity noted within and possibility of a hemarthrosis is not entirely excluded. Degenerative joint space narrowing of the femorotibial compartment with spurring off the femoral condyles and tibial spine. Ligaments Suboptimally assessed by CT. Muscles and Tendons No acute abnormality identified. Soft tissues Popliteal arteriosclerosis. No intramuscular hematoma or subcutaneous fluid collections. IMPRESSION: Moderate joint effusion with subtle hyperdensity noted within either representing debris or possibly hemarthrosis. No fat fluid level to suggest an intra-articular fracture. Tricompartmental  osteoarthritis of the left knee more so involving the patellofemoral compartment and in particular along the lateral patellar facet with remodeled appearance of the lateral patellar facet and associated articulating portion of the femoral condyle. Electronically Signed   By: Ashley Royalty M.D.   On: 07/12/2016 16:45   Dg Knee Complete 4 Views Right  Result Date: 07/12/2016 CLINICAL DATA:  Pain following fall EXAM: RIGHT KNEE - COMPLETE 4+ VIEW COMPARISON:  July 07, 2016 FINDINGS: Frontal, lateral, and bilateral oblique views were obtained. There is a large joint effusion. There is no demonstrable fracture or dislocation. There is moderate narrowing medially and in the patellofemoral joint regions. There is spurring medially and arising from the posterior patella. There are foci of arterial vascular calcification. IMPRESSION: Large joint effusion. Moderate osteoarthritic change medially and in the patellofemoral joint regions. There is atherosclerotic vascular calcification in the visualized superficial femoral and popliteal artery regions. No acute fracture or dislocation evident. Electronically Signed   By: Lowella Grip III M.D.   On: 07/12/2016 11:04    Procedures .Joint Aspiration/Arthrocentesis Date/Time: 07/13/2016 9:53 AM Performed by: Doristine Devoid Authorized by: Ocie Cornfield T   Consent:    Consent obtained:  Verbal and written   Consent given by:  Patient   Risks discussed:  Bleeding, incomplete drainage, infection, nerve damage, pain and poor cosmetic result   Alternatives discussed:  No treatment Location:    Location:  Knee   Knee:  R knee Anesthesia (see MAR for exact dosages):    Anesthesia method:  Topical application Procedure details:    Preparation: Patient was prepped and draped in usual sterile fashion     Needle gauge:  45 G   Ultrasound guidance: no     Approach:  Lateral   Aspirate amount:  70cc   Aspirate characteristics:  Bloody   Steroid  injected: no     Specimen collected: yes   Post-procedure details:    Dressing:  Adhesive bandage   Patient tolerance of procedure:  Tolerated well, no immediate complications Comments:     Dr. Tamera Punt at bedside to assist. Patient tolerated procedure well. Knee sleeve was applied with pressure. Minimal bleeding. Patient had significant improvement in her ability to flex and extend her knee.   (including critical care time)  Medications Ordered in ED Medications  oxyCODONE-acetaminophen (PERCOCET/ROXICET) 5-325 MG per tablet 1 tablet (1 tablet Oral Given 07/12/16 1114)  lidocaine-EPINEPHrine (XYLOCAINE W/EPI) 2 %-1:200000 (PF) injection 10 mL (10 mLs Infiltration Given 07/12/16 1139)  ondansetron (ZOFRAN-ODT) disintegrating tablet 4 mg (4 mg Oral Given 07/12/16 1445)     Initial Impression / Assessment and Plan / ED Course  I have reviewed the triage vital signs and the nursing notes.  Pertinent labs & imaging results that were available during my care  of the patient were reviewed by me and considered in my medical decision making (see chart for details).  Clinical Course   Patient presented to the ED with right knee pain and swelling. Patient hit her knee on the door on 1/11. Was seen in the ED with small joint effusion at that time. X-rays were negative for fracture. Swelling has increased today. Significant effusion noted on exam. X-ray reveals large joint effusion without any bony abnormalities. Patient has limited range of motion to swelling. No erythema, warmth concerning for septic joint. Joint aspiration was performed. Was able to draw off 70 mL of bloody fluid. Heme arthrosis was noted. Patient has significant relief in her symptoms after aspiration. She was able to flex and extend her knee. Fluid was sent down for culture. Small amount of wbc's were noted. No crystals. No bacteria were noted. Culture is pending. Low suspicion for septic joint. Given the amount of blood CT was performed  to rule out occult fracture. Patient has not been able to bear any weight. CT shows no bony abnormalities. There is no laxity in the knee joint itself. Patient needs to follow-up with orthopedist to r/oany ligamentous injury. Patient was given crutches in the ED. Patient was given pain medicine the ED but has not eaten food today. She became nauseous and one episode of emesis. She was given Zofran and able to keep down peanut butter. No further episodes of nausea or emesis. Patient was seen and evaluated by Dr. Tamera Punt who agrees with the above plan. Pt is hemodynamically stable, in NAD, & able to ambulate in the ED. Pain has been managed & has no complaints prior to dc. Pt is comfortable with above plan and is stable for discharge at this time. All questions were answered prior to disposition. Strict return precautions for f/u to the ED were discussed.   Final Clinical Impressions(s) / ED Diagnoses   Final diagnoses:  Effusion of right knee  Acute pain of right knee    New Prescriptions Discharge Medication List as of 07/12/2016  4:58 PM       Doristine Devoid, PA-C 07/13/16 [place], MD 07/16/16 1524

## 2016-07-12 NOTE — ED Notes (Signed)
Walked to main lab to find synovial fluid that was sent down at 1340 by myself.  Marzetta Board, RN aware of same. I agree with what Marzetta Board, RN has written below.

## 2016-07-12 NOTE — ED Triage Notes (Signed)
Per Pt, Pt is coming from work with complaints of right knee pain. Pt hit her knee at work on the 1/11 and tried to return to work today. Pt reports increased swelling and pain. Pt had trouble ambulating and edema noted.

## 2016-07-12 NOTE — ED Notes (Signed)
Pt made aware that CT has been ordered for her knee

## 2016-07-12 NOTE — ED Notes (Signed)
Pt given emesis bag. States she is nauseated.

## 2016-07-15 LAB — BODY FLUID CULTURE
Culture: NO GROWTH
SPECIAL REQUESTS: NORMAL

## 2016-08-01 ENCOUNTER — Ambulatory Visit: Payer: Medicare Other | Attending: Orthopedic Surgery | Admitting: Physical Therapy

## 2016-08-01 DIAGNOSIS — G8929 Other chronic pain: Secondary | ICD-10-CM | POA: Insufficient documentation

## 2016-08-01 DIAGNOSIS — R600 Localized edema: Secondary | ICD-10-CM | POA: Insufficient documentation

## 2016-08-01 DIAGNOSIS — R2689 Other abnormalities of gait and mobility: Secondary | ICD-10-CM | POA: Insufficient documentation

## 2016-08-01 DIAGNOSIS — M25561 Pain in right knee: Secondary | ICD-10-CM | POA: Insufficient documentation

## 2016-08-01 DIAGNOSIS — M25661 Stiffness of right knee, not elsewhere classified: Secondary | ICD-10-CM | POA: Insufficient documentation

## 2016-08-11 ENCOUNTER — Encounter (HOSPITAL_COMMUNITY): Payer: Self-pay | Admitting: Emergency Medicine

## 2016-08-11 ENCOUNTER — Telehealth: Payer: Self-pay | Admitting: Internal Medicine

## 2016-08-11 ENCOUNTER — Emergency Department (HOSPITAL_COMMUNITY)
Admission: EM | Admit: 2016-08-11 | Discharge: 2016-08-11 | Disposition: A | Discharge status: Home / Self Care | Payer: Medicare Other | Attending: Emergency Medicine | Admitting: Emergency Medicine

## 2016-08-11 DIAGNOSIS — M1711 Unilateral primary osteoarthritis, right knee: Secondary | ICD-10-CM | POA: Insufficient documentation

## 2016-08-11 DIAGNOSIS — I130 Hypertensive heart and chronic kidney disease with heart failure and stage 1 through stage 4 chronic kidney disease, or unspecified chronic kidney disease: Secondary | ICD-10-CM | POA: Insufficient documentation

## 2016-08-11 DIAGNOSIS — Z87891 Personal history of nicotine dependence: Secondary | ICD-10-CM | POA: Insufficient documentation

## 2016-08-11 DIAGNOSIS — N183 Chronic kidney disease, stage 3 (moderate): Secondary | ICD-10-CM | POA: Diagnosis not present

## 2016-08-11 DIAGNOSIS — Z7901 Long term (current) use of anticoagulants: Secondary | ICD-10-CM | POA: Diagnosis not present

## 2016-08-11 DIAGNOSIS — I5022 Chronic systolic (congestive) heart failure: Secondary | ICD-10-CM | POA: Insufficient documentation

## 2016-08-11 DIAGNOSIS — M25561 Pain in right knee: Secondary | ICD-10-CM | POA: Diagnosis present

## 2016-08-11 DIAGNOSIS — M25461 Effusion, right knee: Secondary | ICD-10-CM

## 2016-08-11 MED ORDER — OXYCODONE-ACETAMINOPHEN 5-325 MG PO TABS
1.0000 | ORAL_TABLET | ORAL | Status: DC | PRN
  Administered 2016-08-11: 1 via ORAL
  Filled 2016-08-11 (×2): qty 1

## 2016-08-11 MED ORDER — HYDROCODONE-ACETAMINOPHEN 5-325 MG PO TABS: 1.0000 | ORAL_TABLET | Freq: Four times a day (QID) | ORAL | 0 refills | Status: DC | PRN

## 2016-08-11 NOTE — Telephone Encounter (Signed)
New message   Pt verbalized that she hurt her right knee and that she wants to know if Dr.Klein will write her a prescription for pain

## 2016-08-11 NOTE — ED Triage Notes (Signed)
Per PTAR pt opened door onto knee about 5 weeks ago. Had fluids drawn off her knee and given naproxen. Denies re injury to knee.

## 2016-08-11 NOTE — Discharge Instructions (Signed)
Wear knee sleeve for at least 2 weeks for stabilization of knee. Ice and elevate knee throughout the day, using ice pack for no more than 20 minutes every hour. Alternate between ibuprofen and hydrocodone for pain relief. Do not drive or operate machinery with pain medication use. Follow up with our orthopedist in the next 5-7 days for ongoing management of your recurrent knee pain. Return to the ER for changes or worsening symptoms.

## 2016-08-11 NOTE — Telephone Encounter (Signed)
I returned a call to the patient. A family member answered her phone- patient was in the background. She was calling for pain medications, but per the family, she went to the ER.  I advised that we don't prescribe pain medications, so I was glad she had this addressed.

## 2016-08-11 NOTE — ED Triage Notes (Addendum)
Per Unit #[id number]. Bottle of Naproxen was returned to son, and placed in red bag. Family has bag.

## 2016-08-11 NOTE — ED Provider Notes (Signed)
[facility] DEPT Provider Note   CSN: [id number] Arrival date & time: 08/11/16  1325  By signing my name below, I, Ethelle Lyon Long, attest that this documentation has been prepared under the direction and in the presence of [address], [employer]. Electronically Signed: Ethelle Lyon Long, Scribe. 08/11/2016. 2:12 PM.   History   Chief Complaint Chief Complaint  Patient presents with  . Knee Pain    The history is provided by the patient and medical records. No language interpreter was used.  Knee Pain   This is a recurrent problem. The current episode started more than 1 week ago. The problem occurs constantly. The problem has not changed since onset.The pain is present in the right knee. The quality of the pain is described as constant. The pain is at a severity of 10/10. The pain is severe. Associated symptoms include limited range of motion (due to pain). Pertinent negatives include no numbness and no tingling. The symptoms are aggravated by activity. She has tried OTC pain medications (and hydrocodone) for the symptoms. The treatment provided mild relief. There has been a history of trauma.    HPI Comments:  Stephanie Yates is a 71 y.o. female who presents to the Emergency Department complaining of constant ongoing R knee pain and swelling that has been ongoing since she hit her knee on a door on 07/07/16. Chart review reveals she was seen on 07/07/16 s/p hitting her knee on a door, had xray that demonstrated degenerative findings and small effusion at that time, and was discharged with a knee sleeve and hydrocodone rx; She was seen again on 07/12/16 with a larger effusion present on repeat xray, show the joint was tapped and demonstrated a hemorrhagic fluid collection but culture and analysis were negative; CT taken that day showed tricompartmental arthritis and effusion but otherwise no acute injury. She was told to f/up with orthopedist, which she did (saw Dr. Stann Mainland) and was given an rx  for naprosyn but states this isn't helping. She denies reinjury to the area, and reports that she had been doing better however states that today the pain is worse than it had been. She describes the pain as 10/10 constant generalized right knee pain that "just hurts" but she's unable to further describe it, nonradiating, worse with movement, and unimproved with naprosyn, ibuprofen, and ice use, however hydrocodone has helped in the past. Associated symptom includes swelling. She has not been working or doing any strenuous activities since the original injury. Pt denies erythema or warmth of the knee, fevers, chills, CP, SOB, abd pain, N/V/D/C, hematuria, dysuria, myalgias, numbness, tingling, focal weakness, or any other complaints at this time. Pt is on eliquis.    Past Medical History:  Diagnosis Date  . Chronic renal insufficiency, stage III (moderate)    Cr 2.0 5/15  . Chronic systolic CHF (congestive heart failure) ([place])   . History of tobacco abuse   . Hyperlipidemia   . Hypertension   . Nonischemic cardiomyopathy ([place])    EF 10-15% on 11/2012 cath  . Sinus bradycardia 12/21/2012  . Ventricular tachycardia ([place]) 12/14/2012    Patient Active Problem List   Diagnosis Date Noted  . Atrial fibrillation ([place]) 04/01/2013  . ICD (implantable cardioverter-defibrillator), dual, st Judes 04/01/2013  . PVC's (premature ventricular contractions) 04/01/2013  . Hypokalemia 12/21/2012  . History of tobacco abuse 12/21/2012  . Elevated transaminase level 12/21/2012  . Sinus bradycardia 12/21/2012  . Hyperkalemia 12/20/2012  . Nonischemic cardiomyopathy ([place]) 12/20/2012  .  UTI (urinary tract infection) 12/20/2012  . Chronic systolic heart failure ([place]) 12/20/2012  . Ventricular tachycardia ([place]) 12/14/2012  . Hypertension 12/14/2012    Past Surgical History:  Procedure Laterality Date  . IMPLANTABLE CARDIOVERTER DEFIBRILLATOR IMPLANT  12/19/2012   St. Jude dual-chamber ICD, serial number  [id number]  . IMPLANTABLE CARDIOVERTER DEFIBRILLATOR IMPLANT N/A 12/19/2012   Procedure: IMPLANTABLE CARDIOVERTER DEFIBRILLATOR IMPLANT;  Surgeon: Deboraha Sprang, MD;  Location: [facility] CATH LAB;  Service: Cardiovascular;  Laterality: N/A;  . LEFT AND RIGHT HEART CATHETERIZATION WITH CORONARY ANGIOGRAM N/A 12/17/2012   Procedure: LEFT AND RIGHT HEART CATHETERIZATION WITH CORONARY ANGIOGRAM;  Surgeon: Sherren Mocha, MD;  Location: [facility] C[facility] LAB;  Service: Cardiovascular;  Laterality: N/A;    OB History    No data available       Home Medications    Prior to Admission medications   Medication Sig Start Date End Date Taking? Authorizing Provider  amiodarone (PACERONE) 200 MG tablet Take 1 tablet (200 mg total) by mouth daily. 06/07/16   Deboraha Sprang, MD  carvedilol (COREG) 12.5 MG tablet Take 1 tablet (12.5 mg total) by mouth 2 (two) times daily. 06/30/16   Deboraha Sprang, MD  Cholecalciferol (VITAMIN D-3 PO) Take 1,000 Units by mouth 2 (two) times daily.     Historical Provider, MD  ELIQUIS 2.5 MG TABS tablet TAKE ONE TABLET BY MOUTH TWICE DAILY 03/18/16   Deboraha Sprang, MD  furosemide (LASIX) 40 MG tablet Take 1 tablet (40 mg total) by mouth daily. 03/14/16   Sherren Mocha, MD  HYDROcodone-acetaminophen (NORCO) 5-325 MG tablet Take 1 tablet by mouth every 6 (six) hours as needed. Patient taking differently: Take 1 tablet by mouth every 6 (six) hours as needed for moderate pain.  07/07/16   Tatyana Kirichenko, PA-C  simvastatin (ZOCOR) 20 MG tablet TAKE ONE TABLET BY MOUTH IN THE EVENING Patient not taking: Reported on 07/12/2016 12/15/14   Deboraha Sprang, MD  spironolactone (ALDACTONE) 25 MG tablet Take 12.5 mg by mouth daily.  05/26/15   Historical Provider, MD    Family History Family History  Problem Relation Age of Onset  . Cancer Mother     Social History Social History  Substance Use Topics  . Smoking status: Former Research scientist (life sciences)  . Smokeless tobacco: Never Used  . Alcohol use No      Allergies   Strawberry extract   Review of Systems Review of Systems  Constitutional: Negative for chills and fever.  Respiratory: Negative for shortness of breath.   Cardiovascular: Negative for chest pain.  Gastrointestinal: Negative for abdominal pain, constipation, diarrhea, nausea and vomiting.  Genitourinary: Negative for dysuria and hematuria.  Musculoskeletal: Positive for arthralgias and joint swelling. Negative for myalgias.  Skin: Negative for color change and wound.  Allergic/Immunologic: Negative for immunocompromised state.  Neurological: Negative for tingling, weakness and numbness.  Hematological: Bruises/bleeds easily (on eliquis).  Psychiatric/Behavioral: Negative for confusion.   10 Systems reviewed and are negative for acute change except as noted in the HPI.   Physical Exam Updated Vital Signs BP 133/91   Pulse (!) 50   Temp 97.4 F (36.3 C) (Oral)   Ht 5\' 6"  (1.676 m)   Wt 180 lb (81.6 kg)   SpO2 99%   BMI 29.05 kg/m   Physical Exam  Constitutional: She is oriented to person, place, and time. Vital signs are normal. She appears well-developed and well-nourished.  Non-toxic appearance. No distress.  Afebrile, nontoxic, NAD  HENT:  Head:  Normocephalic and atraumatic.  Mouth/Throat: Mucous membranes are normal.  Eyes: Conjunctivae and EOM are normal. Right eye exhibits no discharge. Left eye exhibits no discharge.  Neck: Normal range of motion. Neck supple.  Cardiovascular: Normal rate and intact distal pulses.   Pulmonary/Chest: Effort normal. No respiratory distress.  Abdominal: Normal appearance. She exhibits no distension.  Musculoskeletal:       Right knee: She exhibits decreased range of motion (d/t pain) and effusion. She exhibits no ecchymosis, no deformity, no laceration, no erythema, normal alignment, no LCL laxity, normal patellar mobility and no MCL laxity. Tenderness found. Lateral joint line tenderness noted.  Right knee with  limited ROM d/t pain, with mild lateral joint line, but no other bony TTP, +effusion, no swelling/deformity, no bruising or erythema, no warmth, no abnormal alignment or patellar mobility, no varus/valgus laxity, neg anterior drawer test, no crepitus.  Strength and sensation grossly intact, distal pulses intact, compartments soft  Neurological: She is alert and oriented to person, place, and time. She has normal strength. No sensory deficit.  Skin: Skin is warm, dry and intact. No rash noted.  Psychiatric: She has a normal mood and affect. Her behavior is normal.  Nursing note and vitals reviewed.    ED Treatments / Results  DIAGNOSTIC STUDIES:  Oxygen Saturation is 99% on RA, normal by my interpretation.    COORDINATION OF CARE:  2:09 PM Discussed treatment plan with pt at bedside including Percocet, Hydrocodone Rx, and knee immobilizer and pt agreed to plan.  Labs (all labs ordered are listed, but only abnormal results are displayed) Labs Reviewed - No data to display  EKG  EKG Interpretation None       Radiology No results found.   Xray 07/07/16 R knee Study Result: CLINICAL DATA:  Hit right knee against a door now with pain at the lateral knee CT  EXAM: RIGHT KNEE - COMPLETE 4+ VIEW  COMPARISON:  None.  FINDINGS: Bones are diffusely demineralized. Tricompartmental degenerative spurring noted with loss of joint space in medial compartment. Joint effusion evident in the suprapatellar bursa. No evidence of fracture. No subluxation or dislocation.  IMPRESSION: Degenerative changes and joint effusion without acute bony findings.   Electronically Signed   By: Misty Stanley M.D.   On: 07/07/2016 19:10   07/12/16 Xray R knee Study Result  CLINICAL DATA:  Pain following fall  EXAM: RIGHT KNEE - COMPLETE 4+ VIEW  COMPARISON:  July 07, 2016  FINDINGS: Frontal, lateral, and bilateral oblique views were obtained. There is a large joint effusion. There  is no demonstrable fracture or dislocation. There is moderate narrowing medially and in the patellofemoral joint regions. There is spurring medially and arising from the posterior patella. There are foci of arterial vascular calcification.  IMPRESSION: Large joint effusion. Moderate osteoarthritic change medially and in the patellofemoral joint regions. There is atherosclerotic vascular calcification in the visualized superficial femoral and popliteal artery regions. No acute fracture or dislocation evident.   Electronically Signed   By: Lowella Grip III M.D.   On: 07/12/2016 11:04     Results for orders placed or performed during the hospital encounter of 07/12/16  Body fluid culture  Result Value Ref Range   Specimen Description SYNOVIAL RIGHT KNEE    Special Requests Normal    Gram Stain      FEW WBC PRESENT, PREDOMINANTLY PMN NO ORGANISMS SEEN    Culture No growth aerobically or anaerobically.    Report Status 07/15/2016 FINAL   Synovial  cell count + diff, w/ crystals  Result Value Ref Range   Color, Synovial RED (A) YELLOW   Appearance-Synovial TURBID (A) CLEAR   Crystals, Fluid NO CRYSTALS SEEN    WBC, Synovial 3,063 (H) 0 - 200 /cu mm   Neutrophil, Synovial 50 (H) 0 - 25 %   Lymphocytes-Synovial Fld 34 (H) 0 - 20 %   Monocyte-Macrophage-Synovial Fluid 16 (L) 50 - 90 %   Eosinophils-Synovial 0 0 - 1 %   Ct Knee Right Wo Contrast  Result Date: 07/12/2016 CLINICAL DATA:  Right knee pain after hitting knee against door on Thursday. EXAM: CT OF THE RIGHT KNEE WITHOUT CONTRAST TECHNIQUE: Multidetector CT imaging of the RIGHT knee was performed according to the standard protocol. Multiplanar CT image reconstructions were also generated. COMPARISON:  07/12/2016 radiographs. FINDINGS: Bones/Joint/Cartilage No acute fracture noted. Subchondral sclerosis, cysts and/or erosions as well as chondromalacia of the patellofemoral compartment moreso laterally likely on the  basis of osteoarthritis with remodeled appearance of the lateral patellar facet. Moderate joint effusion is identified without fat fluid level. There is subtle hyperdensity noted within and possibility of a hemarthrosis is not entirely excluded. Degenerative joint space narrowing of the femorotibial compartment with spurring off the femoral condyles and tibial spine. Ligaments Suboptimally assessed by CT. Muscles and Tendons No acute abnormality identified. Soft tissues Popliteal arteriosclerosis. No intramuscular hematoma or subcutaneous fluid collections. IMPRESSION: Moderate joint effusion with subtle hyperdensity noted within either representing debris or possibly hemarthrosis. No fat fluid level to suggest an intra-articular fracture. Tricompartmental osteoarthritis of the left knee more so involving the patellofemoral compartment and in particular along the lateral patellar facet with remodeled appearance of the lateral patellar facet and associated articulating portion of the femoral condyle. Electronically Signed   By: Ashley Royalty M.D.   On: 07/12/2016 16:45     Procedures Procedures (including critical care time)  Medications Ordered in ED Medications  oxyCODONE-acetaminophen (PERCOCET/ROXICET) 5-325 MG per tablet 1 tablet (1 tablet Oral Given 08/11/16 1347)     Initial Impression / Assessment and Plan / ED Course  I have reviewed the triage vital signs and the nursing notes.  Pertinent labs & imaging results that were available during my care of the patient were reviewed by me and considered in my medical decision making (see chart for details).     71 y.o. female here with ongoing R knee pain and swelling since striking it on a door on 07/07/16. Had xrays showing degenerative findings and small effusion that day, on 07/12/16 she returned with a larger effusion which was drained and found to be hemorrhagic, CT that day was neg for occult fx, demonstrated tricompartmental arthritis and  effusion. Sent home and told to f/up with ortho. No reinjury, just states it still hurts and is swollen. Was given naprosyn without relief. On 07/07/16 she had been rx'd hydrocodone which she said helped more. On exam, mild effusion and tenderness to lateral joint line, mildly limited AROM due to pain but full PROM still possible, no erythema/warmth, doubt septic joint. Likely just still from arthritic related pain after the knee sprain/contusion. Doubt need for further emergent work up. [place] reviewed prior to dispensing controlled substance medications, and was notable for: hydrocodone rx 07/07/16, consistent with what she's reporting. No narcotics since then. I feel it's reasonable to give her this, since it's helped. Risks/benefits/alternatives and expectations discussed regarding controlled substances. Side effects of medications discussed. Informed consent obtained. Advised RICE, compression sleeve, and f/up with orthopedist  for ongoing definitive management of her symptoms. I explained the diagnosis and have given explicit precautions to return to the ER including for any other new or worsening symptoms. The patient understands and accepts the medical plan as it's been dictated and I have answered their questions. Discharge instructions concerning home care and prescriptions have been given. The patient is STABLE and is discharged to home in good condition.   I personally performed the services described in this documentation, which was scribed in my presence. The recorded information has been reviewed and is accurate.   Final Clinical Impressions(s) / ED Diagnoses   Final diagnoses:  Arthritis of knee, right  Knee effusion, right    New Prescriptions New Prescriptions   HYDROCODONE-ACETAMINOPHEN (NORCO) 5-325 MG TABLET    Take 1 tablet by mouth every 6 (six) hours as needed for severe pain.     [address] PA-C 08/11/16 [place], MD 08/12/16 [phone]

## 2016-08-16 ENCOUNTER — Ambulatory Visit: Payer: Medicare Other | Admitting: Physical Therapy

## 2016-08-16 DIAGNOSIS — M25561 Pain in right knee: Secondary | ICD-10-CM | POA: Diagnosis present

## 2016-08-16 DIAGNOSIS — R2689 Other abnormalities of gait and mobility: Secondary | ICD-10-CM

## 2016-08-16 DIAGNOSIS — M25661 Stiffness of right knee, not elsewhere classified: Secondary | ICD-10-CM | POA: Diagnosis present

## 2016-08-16 DIAGNOSIS — R600 Localized edema: Secondary | ICD-10-CM

## 2016-08-16 DIAGNOSIS — G8929 Other chronic pain: Secondary | ICD-10-CM

## 2016-08-16 NOTE — Therapy (Signed)
[place], [place], [address] Phone: [phone]   Fax:  [phone]  Physical Therapy Evaluation  Patient Details  Name: Stephanie Yates MRN: [id number] Date of Birth: 1945/10/16 Referring Provider: Geralynn Rile PA-C  Encounter Date: 08/16/2016      PT End of Session - 08/16/16 1709    Visit Number 1   Number of Visits 7   Date for PT Re-Evaluation 10/11/16   Authorization Type [employer] mod by 15th visit, progress note by 10th visit   PT Start Time 1620   PT Stop Time 1708   PT Time Calculation (min) 48 min   Activity Tolerance Patient tolerated treatment well   Behavior During Therapy [facility] for tasks assessed/performed      Past Medical History:  Diagnosis Date  . Chronic renal insufficiency, stage III (moderate)    Cr 2.0 5/15  . Chronic systolic CHF (congestive heart failure) ([place])   . History of tobacco abuse   . Hyperlipidemia   . Hypertension   . Nonischemic cardiomyopathy ([place])    EF 10-15% on 11/2012 cath  . Sinus bradycardia 12/21/2012  . Ventricular tachycardia ([place]) 12/14/2012    Past Surgical History:  Procedure Laterality Date  . IMPLANTABLE CARDIOVERTER DEFIBRILLATOR IMPLANT  12/19/2012   St. Jude dual-chamber ICD, serial number [id number]  . IMPLANTABLE CARDIOVERTER DEFIBRILLATOR IMPLANT N/A 12/19/2012   Procedure: IMPLANTABLE CARDIOVERTER DEFIBRILLATOR IMPLANT;  Surgeon: Deboraha Sprang, MD;  Location: [facility] CATH LAB;  Service: Cardiovascular;  Laterality: N/A;  . LEFT AND RIGHT HEART CATHETERIZATION WITH CORONARY ANGIOGRAM N/A 12/17/2012   Procedure: LEFT AND RIGHT HEART CATHETERIZATION WITH CORONARY ANGIOGRAM;  Surgeon: Sherren Mocha, MD;  Location: [facility] CATH LAB;  Service: Cardiovascular;  Laterality: N/A;    There were no vitals filed for this visit.       Subjective Assessment - 08/16/16 1629    Subjective pt is a 71 y.o F with CC of R knee pain that started about 2-3 weeks  ago with the bathroom door at her job hitting her knee. since onset the pain fluctuates but seems to stay the same, swelling may fluctate. pain seems to be located mostly in the lateral aspect of the knee, and possible refer of pain down to the foot from compensating.    Pertinent History Pacemaker   Limitations Standing;Walking;House hold activities;Sitting;Lifting   How long can you sit comfortably? unlimited   How long can you stand comfortably? if no pain unlimited, 5 min with pain   How long can you walk comfortably? if no pain unlimited, 5 min with pain   Diagnostic tests x-ray   Patient Stated Goals decrease pain, return to work, to be able to walk without crutch.    Currently in Pain? Yes   Pain Score 6    Pain Location Knee   Pain Orientation Right   Pain Descriptors / Indicators Throbbing;Tightness;Aching;Sore   Pain Type --  sub-acute.   Pain Onset 1 to 4 weeks ago   Pain Frequency Intermittent   Aggravating Factors  prolonged standing,    Pain Relieving Factors pain medication, ice            [facility] PT Assessment - 08/16/16 1618      Assessment   Medical Diagnosis R knee OA   Referring Provider Geralynn Rile PA-C   Onset Date/Surgical Date --  2-3 weeks ago   Hand Dominance Right   Next MD Visit make one PRN  Prior Therapy no     Precautions   Precautions None     Restrictions   Weight Bearing Restrictions No     Balance Screen   Has the patient fallen in the past 6 months No   Has the patient had a decrease in activity level because of a fear of falling?  No   Is the patient reluctant to leave their home because of a fear of falling?  No     Home Environment   Living Environment Private residence   Living Arrangements Alone   Available Help at Discharge Available PRN/intermittently;Family   Type of [place] to enter   Entrance Stairs-Number of Steps 2   Entrance Stairs-Rails Can reach both   Home Layout One level   [place]     Prior Function   Level of Independence Independent   Vocation Full time Insurance claims handler Requirements prlonged standing,    Leisure shopping,      Cognition   Overall Cognitive Status Within Functional Limits for tasks assessed     Observation/Other Assessments   Focus on Therapeutic Outcomes (FOTO)  76% limited  predicted 51% limited     Observation/Other Assessments-Edema    Edema Circumferential     Circumferential Edema   Circumferential - Right 10cm above 45cm ,40.5 cm at joint line, 10 cm below 43.5cm   Circumferential - Left  10cm above 41cm , 39.5 cmat joint line, 10 cm below 33.75cm      Posture/Postural Control   Posture/Postural Control Postural limitations   Postural Limitations Rounded Shoulders;Forward head     ROM / Strength   AROM / PROM / Strength AROM;PROM;Strength     AROM   AROM Assessment Site Knee   Right/Left Knee Right;Left   Right Knee Extension -9  ERP   Right Knee Flexion 80   Left Knee Extension --  +2   Left Knee Flexion 127     PROM   PROM Assessment Site Knee   Right/Left Knee Right   Right Knee Extension -7  ERP   Right Knee Flexion 95  ERP / tightness     Strength   Strength Assessment Site Hip;Knee   Right/Left Hip Right;Left   Right Hip Flexion 3+/5  within available ROM   Right Hip Extension 3/5   Right Hip ABduction 3/5   Right Hip ADduction 4/5   Left Hip Flexion 4-/5  compensates with trun extension   Left Hip Extension 4-/5   Left Hip ABduction 4-/5   Left Hip ADduction 4/5   Right/Left Knee Right;Left   Right Knee Flexion 4-/5  pain during testing   Right Knee Extension 4-/5  with pain during testing   Left Knee Flexion 4/5   Left Knee Extension 4/5     Palpation   Patella mobility no hypomobility compared  bil   Palpation comment increased warmth compared bil, multiple knots inthe vastus lateralis, laterally tracking R patella compared bil     Special Tests     Special Tests Swelling Tests;Knee Special Tests   Knee Special tests  Patellofemoral Apprehension Test   Swelling Tests Patella Tap Test (Ballotable Patella)     Patellofemoral Apprehension Test    Findings Negative   Side  Right     Patella Tap Test (Ballotable Patella)   Findings Positive   Side Right     Ambulation/Gait   Ambulation/Gait Yes   Assistive  device R Axillary Crutch   Gait Pattern Step-through pattern;Decreased stride length;Trendelenburg;Antalgic;Decreased trunk rotation;Trunk flexed                           PT Education - 08/16/16 1703    Education provided Yes   Education Details evaluation findings, POC, goals, HEP with proper form, gait training using the crutch on the proper side, and to avoid wearing flip flps with the an assistive device.    Person(s) Educated Patient   Methods Explanation;Verbal cues;Handout   Comprehension Verbalized understanding;Verbal cues required          PT Short Term Goals - 08/16/16 1717      PT SHORT TERM GOAL #1   Title pt will be I with inital HEP (09/06/2016)   Time 3   Period Weeks   Status New     PT SHORT TERM GOAL #2   Title pt will be able to verbalize and demo techniques to prevent pain and edema to promote knee mobility via Ice/ Heat modalities with elevation (09/06/2016)   Time 3   Period Weeks   Status New     PT SHORT TERM GOAL #3   Title she will decrease edema in the knee by >/= .5 cm, at the knee and 10cm above/ below the knee to promote knee AROM and reduce pain (09/06/2016)   Time 3   Period Weeks   Status New           PT Long Term Goals - 08/16/16 1720      PT LONG TERM GOAL #1   Title pt will be I with all HEP given as of last visit (10/11/2016)   Time 6   Period Weeks   Status New     PT LONG TERM GOAL #2   Title pt will improve R knee extension to >/= -3 degrees and flexion to >/= 125 degrees with </= 2/10 pain to promote functional and efficient gait pattern  (10/11/2016)   Time 6   Period Weeks   Status New     PT LONG TERM GOAL #3   Title she will increase R hip/ knee strength to >= 4/5 with </= 2/10 pain to promote safety with walking and standing (10/11/2016)   Time 6   Period Weeks   Status New     PT LONG TERM GOAL #4   Title she will be able to walk/ stand for >/= 45 min with no assistive device with to assist with job related activities (10/11/2016)   Time 6   Period Weeks   Status New     PT LONG TERM GOAL #5   Title she will increase FOTO score to </=51% limited to demonstrate improvement at discharge (10/11/2016)   Time 6   Period Weeks   Status New               Plan - 08/16/16 1709    Clinical Impression Statement Ms. Secrist presents to OPPT as a moderate complexity evaluation due to involved PMHx/ age, continued fluctating pain and exam findings. limited knee mobiltiy due to pain and tightness with visible increased edema. Weakness note in bil hips/ knees with R worse than L due to pain and tightness. currently uses single crutch exhibiting an antalgic gait pattern with limited stride bil. She would benefit from physical therapy to decreased R knee edema, pain, weakness and return pt to PLOF by addressing the deficits listed.  Rehab Potential Good   PT Frequency 1x / week   PT Duration 6 weeks   PT Treatment/Interventions ADLs/Self Care Home Management;Cryotherapy;Electrical Stimulation;Iontophoresis 4mg /ml Dexamethasone;Moist Heat;Ultrasound;Dry needling;Taping;Vasopneumatic Device;Passive range of motion;Therapeutic activities;Therapeutic exercise;Gait training;Balance training;Functional mobility training;Manual techniques;Patient/family education   PT Next Visit Plan assess / review HEP, manual to calm down swelling, hip/ knee strengthening, trial MHP with elevation (hx of Pacemaker no E-stim)   PT Home Exercise Plan quad set, SLR, heel slides, hip flexor stretch   Consulted and Agree with Plan of Care Patient       Patient will benefit from skilled therapeutic intervention in order to improve the following deficits and impairments:  Abnormal gait, Pain, Improper body mechanics, Postural dysfunction, Decreased endurance, Decreased activity tolerance, Decreased balance, Difficulty walking, Decreased range of motion, Increased fascial restricitons, Increased edema, Decreased mobility, Decreased strength, Decreased knowledge of use of DME  Visit Diagnosis: Chronic pain of right knee - Plan: PT plan of care cert/re-cert  Stiffness of right knee, not elsewhere classified - Plan: PT plan of care cert/re-cert  Other abnormalities of gait and mobility - Plan: PT plan of care cert/re-cert  Localized edema - Plan: PT plan of care cert/re-cert      G-Codes - 37/16/96 1733    Functional Assessment Tool Used (Outpatient Only) FOTO/ clinical judgement   Functional Limitation Mobility: Walking and moving around   Mobility: Walking and Moving Around Current Status ([id number]) At least 60 percent but less than 80 percent impaired, limited or restricted   Mobility: Walking and Moving Around Goal Status [phone]) At least 40 percent but less than 60 percent impaired, limited or restricted       Problem List Patient Active Problem List   Diagnosis Date Noted  . Atrial fibrillation ([place]) 04/01/2013  . ICD (implantable cardioverter-defibrillator), dual, st Judes 04/01/2013  . PVC's (premature ventricular contractions) 04/01/2013  . Hypokalemia 12/21/2012  . History of tobacco abuse 12/21/2012  . Elevated transaminase level 12/21/2012  . Sinus bradycardia 12/21/2012  . Hyperkalemia 12/20/2012  . Nonischemic cardiomyopathy ([place]) 12/20/2012  . UTI (urinary tract infection) 12/20/2012  . Chronic systolic heart failure ([place]) 12/20/2012  . Ventricular tachycardia ([place]) 12/14/2012  . Hypertension 12/14/2012   Starr Lake PT, DPT, LAT, ATC  08/16/16  5:36 PM      [place]  [facility] [address] [place], [place], [address] Phone: [phone]   Fax:  [phone]  Name: Stephanie Yates MRN: [id number] Date of Birth: 1945-11-14

## 2016-08-19 ENCOUNTER — Telehealth: Payer: Self-pay | Admitting: Internal Medicine

## 2016-08-19 NOTE — Telephone Encounter (Signed)
Spoke with patient and let her know that it was sent in for #180, patient will call [facility] and follow up on this.

## 2016-08-19 NOTE — Telephone Encounter (Signed)
Medication Detail    Disp Refills Start End   carvedilol (COREG) 12.5 MG tablet 180 tablet 0 06/30/2016    Sig - Route: Take 1 tablet (12.5 mg total) by mouth 2 (two) times daily. - Oral   E-Prescribing Status: Receipt confirmed by pharmacy (06/30/2016 3:53 PM EST)   Pharmacy   [name], [place]

## 2016-08-19 NOTE — Telephone Encounter (Signed)
New Message    Pt states she is completely out of her medication    *STAT* If patient is at the pharmacy, call can be transferred to refill team.   1. Which medications need to be refilled? (please list name of each medication and dose if known)  carvedilol (COREG) 12.5 MG tablet Take 1 tablet (12.5 mg total) by mouth 2 (two) times daily.     2. Which pharmacy/location (including street and city if local pharmacy) is medication to be sent to? [place]   3. Do they need a 30 day or 90 day supply? 90  Pt states she was not given 180 pills she was only given 1 month refill, last time she had it filled

## 2016-08-23 ENCOUNTER — Ambulatory Visit: Payer: Medicare Other | Admitting: Physical Therapy

## 2016-08-23 DIAGNOSIS — M25661 Stiffness of right knee, not elsewhere classified: Secondary | ICD-10-CM

## 2016-08-23 DIAGNOSIS — G8929 Other chronic pain: Secondary | ICD-10-CM

## 2016-08-23 DIAGNOSIS — M25561 Pain in right knee: Secondary | ICD-10-CM | POA: Diagnosis not present

## 2016-08-23 DIAGNOSIS — R2689 Other abnormalities of gait and mobility: Secondary | ICD-10-CM

## 2016-08-23 DIAGNOSIS — R600 Localized edema: Secondary | ICD-10-CM

## 2016-08-23 NOTE — Therapy (Signed)
[place], [place], [address] Phone: [phone]   Fax:  [phone]  Physical Therapy Treatment  Patient Details  Name: Stephanie Yates MRN: [id number] Date of Birth: 12-Sep-1945 Referring Provider: Geralynn Rile PA-C  Encounter Date: 08/23/2016      PT End of Session - 08/23/16 1538    Visit Number 2   Number of Visits 7   Date for PT Re-Evaluation 10/11/16   PT Start Time 1500   PT Stop Time 1547   PT Time Calculation (min) 47 min   Activity Tolerance Patient tolerated treatment well   Behavior During Therapy [facility] for tasks assessed/performed      Past Medical History:  Diagnosis Date  . Chronic renal insufficiency, stage III (moderate)    Cr 2.0 5/15  . Chronic systolic CHF (congestive heart failure) ([place])   . History of tobacco abuse   . Hyperlipidemia   . Hypertension   . Nonischemic cardiomyopathy ([place])    EF 10-15% on 11/2012 cath  . Sinus bradycardia 12/21/2012  . Ventricular tachycardia ([place]) 12/14/2012    Past Surgical History:  Procedure Laterality Date  . IMPLANTABLE CARDIOVERTER DEFIBRILLATOR IMPLANT  12/19/2012   St. Jude dual-chamber ICD, serial number [id number]  . IMPLANTABLE CARDIOVERTER DEFIBRILLATOR IMPLANT N/A 12/19/2012   Procedure: IMPLANTABLE CARDIOVERTER DEFIBRILLATOR IMPLANT;  Surgeon: Stephanie Sprang, MD;  Location: [facility] CATH LAB;  Service: Cardiovascular;  Laterality: N/A;  . LEFT AND RIGHT HEART CATHETERIZATION WITH CORONARY ANGIOGRAM N/A 12/17/2012   Procedure: LEFT AND RIGHT HEART CATHETERIZATION WITH CORONARY ANGIOGRAM;  Surgeon: Stephanie Mocha, MD;  Location: [facility] CATH LAB;  Service: Cardiovascular;  Laterality: N/A;    There were no vitals filed for this visit.      Subjective Assessment - 08/23/16 1503    Subjective " I am doing better with less pain but do occasionally get an twinge here and there"    Currently in Pain? Yes   Pain Score 2    Pain Location Knee    Pain Orientation Right   Aggravating Factors  N/A   Pain Relieving Factors exercises                         [facility] Adult PT Treatment/Exercise - 08/23/16 1507      Knee/Hip Exercises: Stretches   Active Hamstring Stretch 2 reps;30 seconds   Hip Flexor Stretch 2 reps;30 seconds     Knee/Hip Exercises: Aerobic   Nustep L4 x 8 min     Knee/Hip Exercises: Standing   Gait Training 4 x 30 ft using Single crutche with proper use   verbal visual cues to step with R foot/ crutch (on the left)     Knee/Hip Exercises: Supine   Quad Sets AROM;1 set;10 reps   Short Arc [employer] Strengthening;Right;2 sets;10 reps   Short Arc Quad Sets Limitations attempted 2# but pt was unable to perform full rep; removed #   Heel Slides Strengthening;AAROM;Right;10 reps;1 set   Straight Leg Raises Strengthening;Right;1 set;10 reps   Other Supine Knee/Hip Exercises clamshells 2 x 10 with red theraband                PT Education - 08/23/16 1538    Education provided Yes   Education Details reviewed HEP and gait training with DME on proper side.    Person(s) Educated Patient   Methods Explanation;Verbal cues   Comprehension Verbalized understanding;Verbal cues required  PT Short Term Goals - 08/16/16 1717      PT SHORT TERM GOAL #1   Title pt will be I with inital HEP (09/06/2016)   Time 3   Period Weeks   Status New     PT SHORT TERM GOAL #2   Title pt will be able to verbalize and demo techniques to prevent pain and edema to promote knee mobility via Ice/ Heat modalities with elevation (09/06/2016)   Time 3   Period Weeks   Status New     PT SHORT TERM GOAL #3   Title she will decrease edema in the knee by >/= .5 cm, at the knee and 10cm above/ below the knee to promote knee AROM and reduce pain (09/06/2016)   Time 3   Period Weeks   Status New           PT Long Term Goals - 08/16/16 1720      PT LONG TERM GOAL #1   Title pt will be I with all HEP  given as of last visit (10/11/2016)   Time 6   Period Weeks   Status New     PT LONG TERM GOAL #2   Title pt will improve R knee extension to >/= -3 degrees and flexion to >/= 125 degrees with </= 2/10 pain to promote functional and efficient gait pattern (10/11/2016)   Time 6   Period Weeks   Status New     PT LONG TERM GOAL #3   Title she will increase R hip/ knee strength to >= 4/5 with </= 2/10 pain to promote safety with walking and standing (10/11/2016)   Time 6   Period Weeks   Status New     PT LONG TERM GOAL #4   Title she will be able to walk/ stand for >/= 45 min with no assistive device with to assist with job related activities (10/11/2016)   Time 6   Period Weeks   Status New     PT LONG TERM GOAL #5   Title she will increase FOTO score to </=51% limited to demonstrate improvement at discharge (10/11/2016)   Time 6   Period Weeks   Status New               Plan - 08/23/16 1539    Clinical Impression Statement [name] reports decreased pain and tightness in the knee and states she has done her HEP consistently. Reviewed HEp exericses and worked on hip/ knee strengthening and gait training. She reported no increase in pain post session.    PT Next Visit Plan  manual to calm down swelling, hip/ knee strengthening, trial MHP with elevation (hx of Pacemaker no E-stim)   PT Home Exercise Plan quad set, SLR, heel slides, hip flexor stretch   Consulted and Agree with Plan of Care Patient      Patient will benefit from skilled therapeutic intervention in order to improve the following deficits and impairments:  Abnormal gait, Pain, Improper body mechanics, Postural dysfunction, Decreased endurance, Decreased activity tolerance, Decreased balance, Difficulty walking, Decreased range of motion, Increased fascial restricitons, Increased edema, Decreased mobility, Decreased strength, Decreased knowledge of use of DME  Visit Diagnosis: Chronic pain of right  knee  Stiffness of right knee, not elsewhere classified  Other abnormalities of gait and mobility  Localized edema     Problem List Patient Active Problem List   Diagnosis Date Noted  . Atrial fibrillation ([place]) 04/01/2013  . ICD (  implantable cardioverter-defibrillator), dual, [employer] Judes 04/01/2013  . PVC's (premature ventricular contractions) 04/01/2013  . Hypokalemia 12/21/2012  . History of tobacco abuse 12/21/2012  . Elevated transaminase level 12/21/2012  . Sinus bradycardia 12/21/2012  . Hyperkalemia 12/20/2012  . Nonischemic cardiomyopathy ([place]) 12/20/2012  . UTI (urinary tract infection) 12/20/2012  . Chronic systolic heart failure ([place]) 12/20/2012  . Ventricular tachycardia ([place]) 12/14/2012  . Hypertension 12/14/2012   Starr Lake PT, DPT, LAT, ATC  08/23/16  3:44 PM      [place] [facility] [address] [place], [place], [address] Phone: [phone]   Fax:  [phone]  Name: Stephanie Yates MRN: [id number] Date of Birth: 27-Aug-1945

## 2016-08-30 ENCOUNTER — Ambulatory Visit: Payer: Medicare Other | Attending: Orthopedic Surgery | Admitting: Physical Therapy

## 2016-08-30 DIAGNOSIS — G8929 Other chronic pain: Secondary | ICD-10-CM

## 2016-08-30 DIAGNOSIS — R600 Localized edema: Secondary | ICD-10-CM | POA: Diagnosis present

## 2016-08-30 DIAGNOSIS — M25561 Pain in right knee: Secondary | ICD-10-CM | POA: Diagnosis not present

## 2016-08-30 DIAGNOSIS — M25661 Stiffness of right knee, not elsewhere classified: Secondary | ICD-10-CM | POA: Diagnosis present

## 2016-08-30 DIAGNOSIS — R2689 Other abnormalities of gait and mobility: Secondary | ICD-10-CM

## 2016-08-30 NOTE — Therapy (Signed)
[place], [place], [address] Phone: [phone]   Fax:  [phone]  Physical Therapy Treatment  Patient Details  Name: Stephanie Yates MRN: [id number] Date of Birth: Jul 15, 1945 Referring Provider: Geralynn Rile PA-C  Encounter Date: 08/30/2016      PT End of Session - 08/30/16 1545    Visit Number 3   Number of Visits 7   Date for PT Re-Evaluation 10/11/16   PT Start Time 6270   PT Stop Time 3500   PT Time Calculation (min) 49 min   Activity Tolerance Patient tolerated treatment well;No increased pain   Behavior During Therapy WFL for tasks assessed/performed      Past Medical History:  Diagnosis Date  . Chronic renal insufficiency, stage III (moderate)    Cr 2.0 5/15  . Chronic systolic CHF (congestive heart failure) ([place])   . History of tobacco abuse   . Hyperlipidemia   . Hypertension   . Nonischemic cardiomyopathy ([place])    EF 10-15% on 11/2012 cath  . Sinus bradycardia 12/21/2012  . Ventricular tachycardia ([place]) 12/14/2012    Past Surgical History:  Procedure Laterality Date  . IMPLANTABLE CARDIOVERTER DEFIBRILLATOR IMPLANT  12/19/2012   St. Jude dual-chamber ICD, serial number [id number]  . IMPLANTABLE CARDIOVERTER DEFIBRILLATOR IMPLANT N/A 12/19/2012   Procedure: IMPLANTABLE CARDIOVERTER DEFIBRILLATOR IMPLANT;  Surgeon: Deboraha Sprang, MD;  Location: [facility] CATH LAB;  Service: Cardiovascular;  Laterality: N/A;  . LEFT AND RIGHT HEART CATHETERIZATION WITH CORONARY ANGIOGRAM N/A 12/17/2012   Procedure: LEFT AND RIGHT HEART CATHETERIZATION WITH CORONARY ANGIOGRAM;  Surgeon: Sherren Mocha, MD;  Location: [facility] CATH LAB;  Service: Cardiovascular;  Laterality: N/A;    There were no vitals filed for this visit.      Subjective Assessment - 08/30/16 1507    Subjective No pain since last visit with [name].   i have been doing exercises.  Swelling has improved.    Currently in Pain? No/denies   Pain  Location Knee   Pain Orientation Right   Pain Frequency Intermittent   Aggravating Factors  N/A   Pain Relieving Factors exercises   Effect of Pain on Daily Activities Not yet back to work,  Not yet lifting groceries.             [facility] PT Assessment - 08/30/16 0001      Circumferential Edema   Circumferential - Right 40 cm at joint line,  post retrograde                     [facility] Adult PT Treatment/Exercise - 08/30/16 0001      Ambulation/Gait   Ambulation/Gait Yes   Assistive device Straight cane   Gait Pattern Step-through pattern   Ambulation Surface Level   Pre-Gait Activities weight shifting,  wall slides facing wall encouraging increased hip use  5-73m X each   Gait Comments She likes to use cane in right hand,  Cane shortened 2 inches,  and is probable still a little high     Knee/Hip Exercises: Standing   Gait Training --   Other Standing Knee Exercises --     Manual Therapy   Manual Therapy Edema management;Taping   Manual therapy comments leg elevated tissue softened and reduced in size.   Kinesiotex Edema;Facilitate Muscle     Kinesiotix   Edema knee right, 2 fans medial lateral.  0 tension   Facilitate Muscle  right quads  PT Education - 08/30/16 1652    Education provided Yes   Education Details gait training,  RICE   Person(s) Educated Patient   Methods Explanation;Demonstration   Comprehension Verbalized understanding;Returned demonstration          PT Short Term Goals - 08/30/16 1548      PT SHORT TERM GOAL #1   Title pt will be I with inital HEP (09/06/2016)   Time 3   Period Weeks   Status On-going     PT SHORT TERM GOAL #2   Title pt will be able to verbalize and demo techniques to prevent pain and edema to promote knee mobility via Ice/ Heat modalities with elevation (09/06/2016)   Time 3   Period Weeks   Status On-going     PT SHORT TERM GOAL #3   Title she will decrease edema in the knee by >/=  .5 cm, at the knee and 10cm above/ below the knee to promote knee AROM and reduce pain (09/06/2016)   Baseline .5, not sure it is consistant   Time 3   Period Weeks   Status On-going           PT Long Term Goals - 08/16/16 1720      PT LONG TERM GOAL #1   Title pt will be I with all HEP given as of last visit (10/11/2016)   Time 6   Period Weeks   Status New     PT LONG TERM GOAL #2   Title pt will improve R knee extension to >/= -3 degrees and flexion to >/= 125 degrees with </= 2/10 pain to promote functional and efficient gait pattern (10/11/2016)   Time 6   Period Weeks   Status New     PT LONG TERM GOAL #3   Title she will increase R hip/ knee strength to >= 4/5 with </= 2/10 pain to promote safety with walking and standing (10/11/2016)   Time 6   Period Weeks   Status New     PT LONG TERM GOAL #4   Title she will be able to walk/ stand for >/= 45 min with no assistive device with to assist with job related activities (10/11/2016)   Time 6   Period Weeks   Status New     PT LONG TERM GOAL #5   Title she will increase FOTO score to </=51% limited to demonstrate improvement at discharge (10/11/2016)   Time 6   Period Weeks   Status New               Plan - 08/30/16 1546    Clinical Impression Statement Able to decrease edema at joint line.5 cm.  Gait training and edema control focus.  progress toward edema goal."  Knee feels a LOT better."   PT Next Visit Plan assess edema strengthening  work toward goals   PT Home Exercise Plan quad set, SLR, heel slides, hip flexor stretch   Consulted and Agree with Plan of Care Patient      Patient will benefit from skilled therapeutic intervention in order to improve the following deficits and impairments:  Abnormal gait, Pain, Improper body mechanics, Postural dysfunction, Decreased endurance, Decreased activity tolerance, Decreased balance, Difficulty walking, Decreased range of motion, Increased fascial restricitons,  Increased edema, Decreased mobility, Decreased strength, Decreased knowledge of use of DME  Visit Diagnosis: Chronic pain of right knee  Stiffness of right knee, not elsewhere classified  Other abnormalities of gait and  mobility  Localized edema     Problem List Patient Active Problem List   Diagnosis Date Noted  . Atrial fibrillation ([place]) 04/01/2013  . ICD (implantable cardioverter-defibrillator), dual, st Judes 04/01/2013  . PVC's (premature ventricular contractions) 04/01/2013  . Hypokalemia 12/21/2012  . History of tobacco abuse 12/21/2012  . Elevated transaminase level 12/21/2012  . Sinus bradycardia 12/21/2012  . Hyperkalemia 12/20/2012  . Nonischemic cardiomyopathy ([place]) 12/20/2012  . UTI (urinary tract infection) 12/20/2012  . Chronic systolic heart failure ([place]) 12/20/2012  . Ventricular tachycardia ([place]) 12/14/2012  . Hypertension 12/14/2012    HARRIS,KAREN PTA 08/30/2016, 4:55 PM  [facility] [address] [place], [place], [address] Phone: [phone]   Fax:  [phone]  Name: Stephanie Yates MRN: [id number] Date of Birth: 09-24-1945

## 2016-09-06 ENCOUNTER — Ambulatory Visit: Payer: Medicare Other | Admitting: Physical Therapy

## 2016-09-13 ENCOUNTER — Encounter: Payer: Self-pay | Admitting: Physical Therapy

## 2016-09-13 ENCOUNTER — Ambulatory Visit: Payer: Medicare Other | Admitting: Physical Therapy

## 2016-09-13 DIAGNOSIS — M25561 Pain in right knee: Secondary | ICD-10-CM | POA: Diagnosis not present

## 2016-09-13 DIAGNOSIS — G8929 Other chronic pain: Secondary | ICD-10-CM

## 2016-09-13 DIAGNOSIS — R2689 Other abnormalities of gait and mobility: Secondary | ICD-10-CM

## 2016-09-13 DIAGNOSIS — R600 Localized edema: Secondary | ICD-10-CM

## 2016-09-13 DIAGNOSIS — M25661 Stiffness of right knee, not elsewhere classified: Secondary | ICD-10-CM

## 2016-09-13 NOTE — Patient Instructions (Addendum)
Hamstring Curl: Resisted (Sitting)    Facing anchor with tubing on right ankle, leg straight out, bend knee. Repeat __10-30__ times per set.  Do _1___ sessions per day.   http://orth.exer.us/669   Copyright  VHI. All rights reserved.     Standing ankle strength issued from exercise drawer all issued,  SLS 5 X goal 10 seconds Double heel and toe lifts 10 -20 x each daily

## 2016-09-13 NOTE — Therapy (Addendum)
[place], [place], [address] Phone: [phone]   Fax:  [phone]  Physical Therapy Treatment / Discharge Summary  Patient Details  Name: RHONNA HOLSTER MRN: [id number] Date of Birth: 27-Jan-1946 Referring Provider: Geralynn Rile PA-C  Encounter Date: 09/13/2016      PT End of Session - 09/13/16 1548    Visit Number 4   Number of Visits 7   Date for PT Re-Evaluation 10/11/16   PT Start Time 1503   PT Stop Time 1544   PT Time Calculation (min) 41 min   Activity Tolerance Patient tolerated treatment well   Behavior During Therapy [facility] for tasks assessed/performed      Past Medical History:  Diagnosis Date  . Chronic renal insufficiency, stage III (moderate)    Cr 2.0 5/15  . Chronic systolic CHF (congestive heart failure) ([place])   . History of tobacco abuse   . Hyperlipidemia   . Hypertension   . Nonischemic cardiomyopathy ([place])    EF 10-15% on 11/2012 cath  . Sinus bradycardia 12/21/2012  . Ventricular tachycardia ([place]) 12/14/2012    Past Surgical History:  Procedure Laterality Date  . IMPLANTABLE CARDIOVERTER DEFIBRILLATOR IMPLANT  12/19/2012   St. Jude dual-chamber ICD, serial number [id number]  . IMPLANTABLE CARDIOVERTER DEFIBRILLATOR IMPLANT N/A 12/19/2012   Procedure: IMPLANTABLE CARDIOVERTER DEFIBRILLATOR IMPLANT;  Surgeon: Deboraha Sprang, MD;  Location: [facility] CATH LAB;  Service: Cardiovascular;  Laterality: N/A;  . LEFT AND RIGHT HEART CATHETERIZATION WITH CORONARY ANGIOGRAM N/A 12/17/2012   Procedure: LEFT AND RIGHT HEART CATHETERIZATION WITH CORONARY ANGIOGRAM;  Surgeon: Sherren Mocha, MD;  Location: [facility] CATH LAB;  Service: Cardiovascular;  Laterality: N/A;    There were no vitals filed for this visit.      Subjective Assessment - 09/13/16 1502    Subjective No pain.  tape did not stay on long.  I stopped using the cane yesterday.  I plan to return to work in the middle of next month.   Currently in Pain? No/denies   Pain Location Knee            [facility] PT Assessment - 09/13/16 0001      Circumferential Edema   Circumferential - Right 42.5 cm mid patella  Prior injury right knee was always a little larger.      AROM   Right Knee Extension -5   Left Knee Flexion 123  measured before exercise.                     [facility] Adult PT Treatment/Exercise - 09/13/16 0001      Therapeutic Activites    Therapeutic Activities Work Scientist, clinical (histocompatibility and immunogenetics), vacume,  low reach, for housekeeping  she was able to do safely with modified good posture     Knee/Hip Exercises: Stretches   Soleus Stretch 3 reps;10 seconds  on incline board     Knee/Hip Exercises: Aerobic   Nustep L4, 6 minutes     Knee/Hip Exercises: Standing   Heel Raises Limitations HEP 10-20 x, both ,  tow lifts HEP both   Lateral Step Up Right;1 set;10 reps;Hand Hold: 1;Step Height: 4"   Forward Step Up Right;1 set;10 reps;Hand Hold: 2;Limitations   Forward Step Up Limitations 6 inches too difficult   Functional Squat 5 reps   Functional Squat Limitations 2 steps rt/LT cues,  sma;ll squats   SLS 1 second  HEP     Knee/Hip Exercises:  Seated   Hamstring Curl 10 reps;Both  red band HEP   Hamstring Limitations HEP red band                PT Education - 09/13/16 1545    Education provided Yes   Education Details HEP   Person(s) Educated Patient   Methods Explanation;Demonstration;Tactile cues;Verbal cues;Handout   Comprehension Verbalized understanding;Returned demonstration          PT Short Term Goals - 09/13/16 1551      PT SHORT TERM GOAL #1   Title pt will be I with inital HEP (09/06/2016)   Baseline independent so far   Time 3   Period Weeks   Status On-going     PT SHORT TERM GOAL #2   Title pt will be able to verbalize and demo techniques to prevent pain and edema to promote knee mobility via Ice/ Heat modalities with elevation (09/06/2016)    Baseline knows what to do,  not doing   Time 3   Period Weeks   Status On-going     PT SHORT TERM GOAL #3   Title she will decrease edema in the knee by >/= .5 cm, at the knee and 10cm above/ below the knee to promote knee AROM and reduce pain (09/06/2016)   Baseline increased today, see flow sheet   Time 3   Period Weeks   Status On-going           PT Long Term Goals - 08/16/16 1720      PT LONG TERM GOAL #1   Title pt will be I with all HEP given as of last visit (10/11/2016)   Time 6   Period Weeks   Status New     PT LONG TERM GOAL #2   Title pt will improve R knee extension to >/= -3 degrees and flexion to >/= 125 degrees with </= 2/10 pain to promote functional and efficient gait pattern (10/11/2016)   Time 6   Period Weeks   Status New     PT LONG TERM GOAL #3   Title she will increase R hip/ knee strength to >= 4/5 with </= 2/10 pain to promote safety with walking and standing (10/11/2016)   Time 6   Period Weeks   Status New     PT LONG TERM GOAL #4   Title she will be able to walk/ stand for >/= 45 min with no assistive device with to assist with job related activities (10/11/2016)   Time 6   Period Weeks   Status New     PT LONG TERM GOAL #5   Title she will increase FOTO score to </=51% limited to demonstrate improvement at discharge (10/11/2016)   Time 6   Period Weeks   Status New               Plan - 09/13/16 1548    Clinical Impression Statement AROM 5-123.  progress toward HEP.  No pain at end of session.  Patient has weaned for her cane. She says her knee has always been more swollen than left.   Knee edema slightly increased today , see flow sheet.   PT Next Visit Plan continue to work with strength   PT Home Exercise Plan quad set, SLR, heel slides, hip flexor stretch,  standing anklem  hamstring band   Consulted and Agree with Plan of Care Patient      Patient will benefit from skilled therapeutic intervention in order  to improve the  following deficits and impairments:  Abnormal gait, Pain, Improper body mechanics, Postural dysfunction, Decreased endurance, Decreased activity tolerance, Decreased balance, Difficulty walking, Decreased range of motion, Increased fascial restricitons, Increased edema, Decreased mobility, Decreased strength, Decreased knowledge of use of DME  Visit Diagnosis: Chronic pain of right knee  Stiffness of right knee, not elsewhere classified  Other abnormalities of gait and mobility  Localized edema     Problem List Patient Active Problem List   Diagnosis Date Noted  . Atrial fibrillation ([place]) 04/01/2013  . ICD (implantable cardioverter-defibrillator), dual, st Judes 04/01/2013  . PVC's (premature ventricular contractions) 04/01/2013  . Hypokalemia 12/21/2012  . History of tobacco abuse 12/21/2012  . Elevated transaminase level 12/21/2012  . Sinus bradycardia 12/21/2012  . Hyperkalemia 12/20/2012  . Nonischemic cardiomyopathy ([place]) 12/20/2012  . UTI (urinary tract infection) 12/20/2012  . Chronic systolic heart failure ([place]) 12/20/2012  . Ventricular tachycardia ([place]) 12/14/2012  . Hypertension 12/14/2012    Amram Maya PTA 09/13/2016, 3:52 PM  [facility] [address] [place], [place], [address] Phone: [phone]   Fax:  [phone]  Name: AALIYAH GAVEL MRN: [id number] Date of Birth: 01-31-1946        PHYSICAL THERAPY DISCHARGE SUMMARY  Visits from Start of Care: 4  Current functional level related to goals / functional outcomes: See goals   Remaining deficits: unknown   Education / Equipment: HEP  Plan: Patient agrees to discharge.  Patient goals were not met. Patient is being discharged due to not returning since the last visit.  ?????    Kristoffer Leamon PT, DPT, LAT, ATC  11/01/16  9:45 AM

## 2016-10-04 ENCOUNTER — Encounter: Payer: Medicare Other | Admitting: Internal Medicine

## 2016-10-05 ENCOUNTER — Encounter: Payer: Self-pay | Admitting: Internal Medicine

## 2016-10-12 ENCOUNTER — Encounter: Payer: Self-pay | Admitting: Internal Medicine

## 2016-10-17 ENCOUNTER — Encounter: Payer: Medicare Other | Admitting: Physician Assistant

## 2016-10-17 NOTE — Progress Notes (Deleted)
Cardiology Office Note Date:  10/17/2016  Patient ID:  Stephanie Yates, Stephanie Yates 09/26/1945, MRN [id number] PCP:  Henrine Screws, MD  Cardiologist:  Dr. Caryl Comes   Chief Complaint: *** planned f/u  History of Present Illness: Stephanie Yates is a 71 y.o. female with history of NICM, VT w/ICD, PAFib, HTN, HLD, CRI stage III comes in to the office today to be seen for Dr. Caryl Comes.  Last seen by him in Dec doing well back on [place].  She was last seen by EP service by myself in Feb, at that time with c/o  DOE with some LE swelling, by Friday was worse, she had called in and was instructed to take a whole pill of her lasix rather then 1/2 and given the Feb appointment.  On the day of that appt, the increased dose and her breathing is improved, no rest SOB, less DOE, no orthopnea or PND, her edema is improved but remains and mentions unable to get her regular or work shoes on without being uncomfortable.  No CP or palpitations, no dizziness, near syncope or syncope, no shocks.  ***symptoms *** fluid status *** needs labs amio and eliquis ***She denies bleeding or signs of bleeding. *** chronic RV thresholds   Device information: SJM dual chamber ICD, implanted 12/19/12, primary prevention, Dr. Caryl Comes AAD tx: amiodarone (2014)   Past Medical History:  Diagnosis Date  . Chronic renal insufficiency, stage III (moderate)    Cr 2.0 5/15  . Chronic systolic CHF (congestive heart failure) ([place])   . History of tobacco abuse   . Hyperlipidemia   . Hypertension   . Nonischemic cardiomyopathy ([place])    EF 10-15% on 11/2012 cath  . Sinus bradycardia 12/21/2012  . Ventricular tachycardia ([place]) 12/14/2012    Past Surgical History:  Procedure Laterality Date  . IMPLANTABLE CARDIOVERTER DEFIBRILLATOR IMPLANT  12/19/2012   St. Jude dual-chamber ICD, serial number [id number]  . IMPLANTABLE CARDIOVERTER DEFIBRILLATOR IMPLANT N/A 12/19/2012   Procedure: IMPLANTABLE CARDIOVERTER DEFIBRILLATOR IMPLANT;  Surgeon:  Deboraha Sprang, MD;  Location: [facility] CATH LAB;  Service: Cardiovascular;  Laterality: N/A;  . LEFT AND RIGHT HEART CATHETERIZATION WITH CORONARY ANGIOGRAM N/A 12/17/2012   Procedure: LEFT AND RIGHT HEART CATHETERIZATION WITH CORONARY ANGIOGRAM;  Surgeon: Sherren Mocha, MD;  Location: [facility] CATH LAB;  Service: Cardiovascular;  Laterality: N/A;    Current Outpatient Prescriptions  Medication Sig Dispense Refill  . amiodarone (PACERONE) 200 MG tablet Take 1 tablet (200 mg total) by mouth daily. 90 tablet 3  . carvedilol (COREG) 12.5 MG tablet Take 1 tablet (12.5 mg total) by mouth 2 (two) times daily. 180 tablet 0  . Cholecalciferol (VITAMIN D-3 PO) Take 1,000 Units by mouth 2 (two) times daily.     Marland Kitchen ELIQUIS 2.5 MG TABS tablet TAKE ONE TABLET BY MOUTH TWICE DAILY 60 tablet 11  . furosemide (LASIX) 40 MG tablet Take 1 tablet (40 mg total) by mouth daily. 30 tablet 6  . HYDROcodone-acetaminophen (NORCO) 5-325 MG tablet Take 1 tablet by mouth every 6 (six) hours as needed. (Patient taking differently: Take 1 tablet by mouth every 6 (six) hours as needed for moderate pain. ) 10 tablet 0  . HYDROcodone-acetaminophen (NORCO) 5-325 MG tablet Take 1 tablet by mouth every 6 (six) hours as needed for severe pain. 15 tablet 0  . simvastatin (ZOCOR) 20 MG tablet TAKE ONE TABLET BY MOUTH IN THE EVENING (Patient not taking: Reported on 07/12/2016) 30 tablet 5  . spironolactone (ALDACTONE) 25 MG  tablet Take 12.5 mg by mouth daily.      No current facility-administered medications for this visit.     Allergies:   Strawberry extract   Social History:  The patient  reports that she has quit smoking. She has never used smokeless tobacco. She reports that she does not drink alcohol or use drugs.   Family History:  The patient's family history includes Cancer in her mother. Family history is unclear, she thinks her mother died of bone cancer, no siblings.  ROS:  Please see the history of present illness.  All other  systems are reviewed and otherwise negative.   PHYSICAL EXAM:  VS:  There were no vitals taken for this visit. BMI: There is no height or weight on file to calculate BMI. Well nourished, well developed, in no acute distress  HEENT: normocephalic, atraumatic  Neck: no JVD, carotid bruits or masses Cardiac: *** RRR; no significant murmurs, no rubs, or gallops Lungs:  *** CTA b/l, no wheezing, rhonchi or rales  Abd: soft, nontender MS: no deformity or atrophy Ext: *** 1++ edema b/l LE Skin: warm and dry, no rash Neuro:  No gross deficits appreciated Psych: euthymic mood, full affect  *** ICD site is stable, no tethering or discomfort  EKG:  Done t2/15/18 was SB, 53bpm, LAD, IVCD, appears similar to older EKGs ICD interrogation today: *** normal device function, RV lead thresholds appear chronically elevated, no observations or detections, battery and leads status stable  04/19/13: TTE Study Conclusions - Left ventricle: The cavity size was normal. Wall thickness was increased in a pattern of mild LVH. Systolic function was moderately reduced. The estimated ejection fraction was in the range of 35% to 40%. Possible mild hypokinesis of the inferior myocardium. Doppler parameters are consistent with abnormal left ventricular relaxation (grade 1 diastolic dysfunction). - Mitral valve: Calcified annulus. Mildly thickened leaflets - Left atrium: The atrium was mildly dilated.  Recent Labs: 03/16/2016: ALT 64; Brain Natriuretic Peptide 367.0; BUN 26; Creat 1.60; Potassium 3.8; Sodium 136; TSH 2.16  No results found for requested labs within last 8760 hours.   CrCl cannot be calculated (Patient's most recent lab result is older than the maximum 21 days allowed.).   Wt Readings from Last 3 Encounters:  08/11/16 180 lb (81.6 kg)  07/12/16 180 lb (81.6 kg)  07/07/16 180 lb (81.6 kg)     Other studies reviewed: Additional studies/records reviewed today include: summarized  above  ASSESSMENT AND PLAN:  1. NICM hx of VT w/ICD    *** Stable ICD function, RV thresholds appear chronically elevated, <1% RV pacing    ***Weight is down with the lasix, edema persists, [place] is up    On BB, aldactone/lasix, no ACE/ARB given renal insufficiency    On Amio     03/16/16 LFTs elevated, though down from prior, TSH 2.16  2. PAFib     *** CHA2DS2Vasc is at least 4 on Eliquis     03/16/16: Creat 1.60  3. HTN     *** stable   *** CMET, TSH, today The patient will give a call to let us know how she is doing next week (anticipate back to her baseline with clear improvement already) for a letter to return to work  Disposition: ***14month in-clinic device check, patient declined remote monitoring, 6 mo with Dr. Caryl Comes sooner if needed.    Current medicines are reviewed at length with the patient today.  The patient did not have any concerns regarding medicines.  Haywood Lasso, PA-C 10/17/2016 10:24 AM     [facility] [address] [place] [address] [phone] (office)  [phone] (fax)

## 2016-10-21 ENCOUNTER — Ambulatory Visit: Payer: Medicare Other | Admitting: Physician Assistant

## 2016-10-31 ENCOUNTER — Other Ambulatory Visit: Payer: Self-pay | Admitting: Internal Medicine

## 2016-11-15 ENCOUNTER — Ambulatory Visit (INDEPENDENT_AMBULATORY_CARE_PROVIDER_SITE_OTHER): Payer: Medicare Other | Admitting: Physician Assistant

## 2016-11-15 VITALS — BP 138/60 | HR 53 | Ht 65.0 in | Wt 156.0 lb

## 2016-11-15 DIAGNOSIS — I428 Other cardiomyopathies: Secondary | ICD-10-CM | POA: Diagnosis not present

## 2016-11-15 DIAGNOSIS — I480 Paroxysmal atrial fibrillation: Secondary | ICD-10-CM

## 2016-11-15 DIAGNOSIS — I472 Ventricular tachycardia, unspecified: Secondary | ICD-10-CM

## 2016-11-15 DIAGNOSIS — I5022 Chronic systolic (congestive) heart failure: Secondary | ICD-10-CM

## 2016-11-15 DIAGNOSIS — Z79899 Other long term (current) drug therapy: Secondary | ICD-10-CM | POA: Diagnosis not present

## 2016-11-15 DIAGNOSIS — I10 Essential (primary) hypertension: Secondary | ICD-10-CM | POA: Diagnosis not present

## 2016-11-15 MED ORDER — SPIRONOLACTONE 25 MG PO TABS: 12.5000 mg | ORAL_TABLET | Freq: Every day | ORAL | 5 refills | Status: DC

## 2016-11-15 NOTE — Patient Instructions (Signed)
Medication Instructions:   START TAKING SPIRONOLACTONE 12.5 MG  HALF OF 25 MG ONCE A DAY   If you need a refill on your cardiac medications before your next appointment, please call your pharmacy.  Labwork: BMET  CBC TSH AND LFT   RETURN FOR REPEAT BMET IN 2 TO 3 WEEKS    Testing/Procedures: NONE ORDERED  TODAY    Follow-Up:  6 WEEKS WOTH [name] URSUY   Any Other Special Instructions Will Be Listed Below (If Applicable).

## 2016-11-15 NOTE — Progress Notes (Signed)
Cardiology Office Note Date:  11/15/2016  Patient ID:  Stephanie Yates, Stephanie Yates 13-Mar-1946, MRN [id number] PCP:  Josetta Huddle, MD  Cardiologist:  Dr. Caryl Comes   Chief Complaint:  planned f/u  History of Present Illness: Stephanie Yates is a 71 y.o. female with history of NICM, VT w/ICD, PAFib, HTN, HLD, CRI stage III comes in to the office today to be seen for Dr. Caryl Comes.  Last seen by him in Dec 2016 doing well back on Ald[place]one.  She was last seen by EP service by myself in Sept, at that time with c/o  DOE with some LE swelling, by Friday was worse, she had called in and was instructed to take a whole pill of her lasix rather then 1/2 and given the Feb appointment.  On the day of that appt, the increased dose and her breathing is improved, and was feeling well.  Unfortunately since that time it appears her spironolactone was stopped, she doesn't recall why or by who, she thinks possibly got missed in her in her refills, is certain she isn't taking it, her simvastatin was held after her last lipid profile.  She has noticed in the last couple months getting winded with longer distances, no rest SOB, sleeping with 1 pillow comfortably.  No CP, palpitations, no dizziness, near syncope or syncope, no shocks.  She denies any bleeding or signs of bleeding.   Device information: SJM dual chamber ICD, implanted 12/19/12, primary prevention, Dr. Caryl Comes Chronically elevated RV threshold AAD tx: amiodarone (2014) 03/16/16: AST 137, ALT 64, (down some/stable), TSH 2.16   Past Medical History:  Diagnosis Date  . Chronic renal insufficiency, stage III (moderate)    Cr 2.0 5/15  . Chronic systolic CHF (congestive heart failure) ([place])   . History of tobacco abuse   . Hyperlipidemia   . Hypertension   . Nonischemic cardiomyopathy ([place])    EF 10-15% on 11/2012 cath  . Sinus bradycardia 12/21/2012  . Ventricular tachycardia ([place]) 12/14/2012    Past Surgical History:  Procedure Laterality Date  . IMPLANTABLE  CARDIOVERTER DEFIBRILLATOR IMPLANT  12/19/2012   St. Jude dual-chamber ICD, serial number [id number]  . IMPLANTABLE CARDIOVERTER DEFIBRILLATOR IMPLANT N/A 12/19/2012   Procedure: IMPLANTABLE CARDIOVERTER DEFIBRILLATOR IMPLANT;  Surgeon: Deboraha Sprang, MD;  Location: [facility] CATH LAB;  Service: Cardiovascular;  Laterality: N/A;  . LEFT AND RIGHT HEART CATHETERIZATION WITH CORONARY ANGIOGRAM N/A 12/17/2012   Procedure: LEFT AND RIGHT HEART CATHETERIZATION WITH CORONARY ANGIOGRAM;  Surgeon: Sherren Mocha, MD;  Location: [facility] CATH LAB;  Service: Cardiovascular;  Laterality: N/A;    Current Outpatient Prescriptions  Medication Sig Dispense Refill  . amiodarone (PACERONE) 200 MG tablet Take 1 tablet (200 mg total) by mouth daily. 90 tablet 3  . carvedilol (COREG) 12.5 MG tablet Take 1 tablet (12.5 mg total) by mouth 2 (two) times daily. 180 tablet 0  . Cholecalciferol (VITAMIN D-3 PO) Take 1,000 Units by mouth 2 (two) times daily.     Marland Kitchen ELIQUIS 2.5 MG TABS tablet TAKE ONE TABLET BY MOUTH TWICE DAILY 60 tablet 11  . furosemide (LASIX) 40 MG tablet Take 1 tablet (40 mg total) by mouth daily. 30 tablet 6  . HYDROcodone-acetaminophen (NORCO) 5-325 MG tablet Take 1 tablet by mouth every 6 (six) hours as needed for moderate pain.    . simvastatin (ZOCOR) 20 MG tablet TAKE ONE TABLET BY MOUTH IN THE EVENING 30 tablet 5  . spironolactone (ALDACTONE) 25 MG tablet Take 0.5 tablets (12.5 mg  total) by mouth daily. 30 tablet 5   No current facility-administered medications for this visit.     Allergies:   Strawberry extract   Social History:  The patient  reports that she has quit smoking. She has never used smokeless tobacco. She reports that she does not drink alcohol or use drugs.   Family History:  The patient's family history includes Cancer in her mother. Family history is unclear, she thinks her mother died of bone cancer, no siblings.  ROS:  Please see the history of present illness.  All other systems are  reviewed and otherwise negative.   PHYSICAL EXAM:  VS:  BP 138/60   Pulse (!) 53   Ht 5\' 5"  (1.651 m)   Wt 156 lb (70.8 kg)   BMI 25.96 kg/m  BMI: Body mass index is 25.96 kg/m. Well nourished, well developed, in no acute distress  HEENT: normocephalic, atraumatic  Neck: no JVD, carotid bruits or masses Cardiac: RRR; no significant murmurs, no rubs, or gallops Lungs:  CTA b/l, no wheezing, rhonchi or rales  Abd: soft, nontender MS: no deformity or atrophy Ext: 2+ edema b/l LE Skin: warm and dry, no rash Neuro:  No gross deficits appreciated Psych: euthymic mood, full affect  ICD site is stable, no tethering or discomfort  EKG:  Done t2/15/18 was SB, 53bpm, LAD, IVCD, appears similar to older EKGs ICD interrogation today:  Battery and lead measurements stable, RV lead thresholds appear chronically elevated, today slightly higher then last and output reprogrammed to 2:1 safety margin, she has <1% V pacing and made no impact on projected battery life, no observations or detections  04/19/13: TTE Study Conclusions - Left ventricle: The cavity size was normal. Wall thickness was increased in a pattern of mild LVH. Systolic function was moderately reduced. The estimated ejection fraction was in the range of 35% to 40%. Possible mild hypokinesis of the inferior myocardium. Doppler parameters are consistent with abnormal left ventricular relaxation (grade 1 diastolic dysfunction). - Mitral valve: Calcified annulus. Mildly thickened leaflets - Left atrium: The atrium was mildly dilated.  Recent Labs: 03/16/2016: ALT 64; Brain Natriuretic Peptide 367.0; BUN 26; Creat 1.60; Potassium 3.8; Sodium 136; TSH 2.16  No results found for requested labs within last 8760 hours.   CrCl cannot be calculated (Patient's most recent lab result is older than the maximum 21 days allowed.).   Wt Readings from Last 3 Encounters:  11/15/16 156 lb (70.8 kg)  08/11/16 180 lb (81.6 kg)    07/12/16 180 lb (81.6 kg)     Other studies reviewed: Additional studies/records reviewed today include: summarized above  ASSESSMENT AND PLAN:  1. NICM hx of VT w/ICD    Stable ICD function, RV thresholds appear chronically elevated, <1% RV pacing, reprogrammed as noted above    Weight is up, [place] is below threshold    On BB, lasix, no ACE/ARB given renal insufficiency    On Amio    03/16/16 LFTs elevated, though down from prior, TSH 2.16  2. PAFib     CHA2DS2Vasc is at least 4 on Eliquis     03/16/16: Creat 1.60  3. HTN     Stable, no changes   Disposition: Resume her Spironolactone 12.5mg  daily, BMET, CBC, TSH and LFTs today, will repeat BMET in 2-3 weeks, see her back in a month, sooner if needed.  She is asked to invest in a new home scale if possible, if not, to monitor her LE swelling and DOE for  improvement, if none she is asked to call and let me know.   Current medicines are reviewed at length with the patient today.  The patient did not have any concerns regarding medicines.  Haywood Lasso, PA-C 11/15/2016 4:01 PM     [address] [place] [address] [phone] (office)  [phone] (fax)

## 2016-11-16 LAB — CBC
HEMOGLOBIN: 9 g/dL — AB (ref 11.1–15.9)
Hematocrit: 27.1 % — ABNORMAL LOW (ref 34.0–46.6)
MCH: 25 pg — AB (ref 26.6–33.0)
MCHC: 33.2 g/dL (ref 31.5–35.7)
MCV: 75 fL — AB (ref 79–97)
Platelets: 195 10*3/uL (ref 150–379)
RBC: 3.6 x10E6/uL — AB (ref 3.77–5.28)
RDW: 17.2 % — ABNORMAL HIGH (ref 12.3–15.4)
WBC: 4.9 10*3/uL (ref 3.4–10.8)

## 2016-11-16 LAB — BASIC METABOLIC PANEL
BUN/Creatinine Ratio: 18 (ref 12–28)
BUN: 27 mg/dL (ref 8–27)
CALCIUM: 9 mg/dL (ref 8.7–10.3)
CO2: 22 mmol/L (ref 18–29)
CREATININE: 1.53 mg/dL — AB (ref 0.57–1.00)
Chloride: 110 mmol/L — ABNORMAL HIGH (ref 96–106)
GFR calc Af Amer: 39 mL/min/{1.73_m2} — ABNORMAL LOW (ref >59)
GFR, EST NON AFRICAN AMERICAN: 34 mL/min/{1.73_m2} — AB (ref >59)
Glucose: 88 mg/dL (ref 65–99)
POTASSIUM: 4.5 mmol/L (ref 3.5–5.2)
Sodium: 143 mmol/L (ref 134–144)

## 2016-11-16 LAB — HEPATIC FUNCTION PANEL
ALK PHOS: 98 IU/L (ref 39–117)
ALT: 32 IU/L (ref 0–32)
AST: 73 IU/L — AB (ref 0–40)
Albumin: 3.3 g/dL — ABNORMAL LOW (ref 3.5–4.8)
BILIRUBIN TOTAL: 1.1 mg/dL (ref 0.0–1.2)
BILIRUBIN, DIRECT: 0.54 mg/dL — AB (ref 0.00–0.40)
Total Protein: 6.8 g/dL (ref 6.0–8.5)

## 2016-11-16 LAB — TSH: TSH: 2.35 u[IU]/mL (ref 0.450–4.500)

## 2016-11-22 ENCOUNTER — Other Ambulatory Visit: Payer: Self-pay | Admitting: *Deleted

## 2016-11-22 DIAGNOSIS — Z79899 Other long term (current) drug therapy: Secondary | ICD-10-CM

## 2016-12-06 ENCOUNTER — Other Ambulatory Visit: Payer: Medicare Other

## 2016-12-09 ENCOUNTER — Other Ambulatory Visit: Payer: Medicare Other

## 2016-12-09 DIAGNOSIS — Z79899 Other long term (current) drug therapy: Secondary | ICD-10-CM

## 2016-12-10 LAB — BASIC METABOLIC PANEL
BUN/Creatinine Ratio: 16 (ref 12–28)
BUN: 23 mg/dL (ref 8–27)
CO2: 25 mmol/L (ref 20–29)
CREATININE: 1.43 mg/dL — AB (ref 0.57–1.00)
Calcium: 8.4 mg/dL — ABNORMAL LOW (ref 8.7–10.3)
Chloride: 105 mmol/L (ref 96–106)
GFR calc Af Amer: 42 mL/min/{1.73_m2} — ABNORMAL LOW (ref >59)
GFR, EST NON AFRICAN AMERICAN: 36 mL/min/{1.73_m2} — AB (ref >59)
Glucose: 112 mg/dL — ABNORMAL HIGH (ref 65–99)
POTASSIUM: 4.4 mmol/L (ref 3.5–5.2)
SODIUM: 139 mmol/L (ref 134–144)

## 2016-12-22 ENCOUNTER — Telehealth: Payer: Self-pay | Admitting: *Deleted

## 2016-12-22 NOTE — Telephone Encounter (Signed)
PT AWARE OF RESULTS AND HAS APPT WITH PMD ON 12-26-16  LAB RESULTS ARE ROUTED TO PROVIDER

## 2016-12-26 ENCOUNTER — Other Ambulatory Visit: Payer: Self-pay | Admitting: Internal Medicine

## 2016-12-26 DIAGNOSIS — N289 Disorder of kidney and ureter, unspecified: Secondary | ICD-10-CM

## 2016-12-26 DIAGNOSIS — R1901 Right upper quadrant abdominal swelling, mass and lump: Secondary | ICD-10-CM

## 2016-12-27 ENCOUNTER — Ambulatory Visit: Payer: Medicare Other | Admitting: Physician Assistant

## 2016-12-27 NOTE — Progress Notes (Deleted)
Cardiology Office Note Date:  12/27/2016  Patient ID:  Christiona, Siddique April 05, 1946, MRN [id number] PCP:  Josetta Huddle, MD  Cardiologist:  Dr. Caryl Comes   Chief Complaint:  planned f/u  History of Present Illness: ISHIA TENORIO is a 71 y.o. female with history of NICM, VT w/ICD, PAFib, HTN, HLD, CRI stage III comes in to the office today to be seen for Dr. Caryl Comes.  Last seen by him in Dec 2016 doing well back on Aldatone.  She was last seen by EP service by myself in Sept, at that time with c/o  DOE with some LE swelling, by Friday was worse, she had called in and was instructed to take a whole pill of her lasix rather then 1/2 and given the Feb appointment.  On the day of that appt, the increased dose and her breathing is improved, and was feeling well.  May she was seen in f/u again by myself,  She has noticed in the last couple months getting winded with longer distances, no rest SOB, sleeping with 1 pillow comfortably. She was felt to be mildly  Fluid OL and that her spironolactone had been dropped from her meds (she thinks missed in refills), this was resumed.  *** No CP, palpitations, no dizziness, near syncope or syncope, no shocks.  She denies any bleeding or signs of bleeding.  *** see her PMD for anemia? *** labs from PMD? *** bleeding *** fluid status   Device information: SJM dual chamber ICD, implanted 12/19/12, primary prevention, Dr. Caryl Comes Chronically elevated RV threshold AAD tx: amiodarone (2014) 03/16/16: AST 137, ALT 64, (down some/stable), TSH 2.16   Past Medical History:  Diagnosis Date  . Chronic renal insufficiency, stage III (moderate)    Cr 2.0 5/15  . Chronic systolic CHF (congestive heart failure) ([place])   . History of tobacco abuse   . Hyperlipidemia   . Hypertension   . Nonischemic cardiomyopathy ([place])    EF 10-15% on 11/2012 cath  . Sinus bradycardia 12/21/2012  . Ventricular tachycardia ([place]) 12/14/2012    Past Surgical History:  Procedure  Laterality Date  . IMPLANTABLE CARDIOVERTER DEFIBRILLATOR IMPLANT  12/19/2012   St. Jude dual-chamber ICD, serial number [id number]  . IMPLANTABLE CARDIOVERTER DEFIBRILLATOR IMPLANT N/A 12/19/2012   Procedure: IMPLANTABLE CARDIOVERTER DEFIBRILLATOR IMPLANT;  Surgeon: Deboraha Sprang, MD;  Location: [facility] CATH LAB;  Service: Cardiovascular;  Laterality: N/A;  . LEFT AND RIGHT HEART CATHETERIZATION WITH CORONARY ANGIOGRAM N/A 12/17/2012   Procedure: LEFT AND RIGHT HEART CATHETERIZATION WITH CORONARY ANGIOGRAM;  Surgeon: Sherren Mocha, MD;  Location: [facility] CATH LAB;  Service: Cardiovascular;  Laterality: N/A;    Current Outpatient Prescriptions  Medication Sig Dispense Refill  . amiodarone (PACERONE) 200 MG tablet Take 1 tablet (200 mg total) by mouth daily. 90 tablet 3  . carvedilol (COREG) 12.5 MG tablet Take 1 tablet (12.5 mg total) by mouth 2 (two) times daily. 180 tablet 0  . Cholecalciferol (VITAMIN D-3 PO) Take 1,000 Units by mouth 2 (two) times daily.     Marland Kitchen ELIQUIS 2.5 MG TABS tablet TAKE ONE TABLET BY MOUTH TWICE DAILY 60 tablet 11  . furosemide (LASIX) 40 MG tablet Take 1 tablet (40 mg total) by mouth daily. 30 tablet 6  . HYDROcodone-acetaminophen (NORCO) 5-325 MG tablet Take 1 tablet by mouth every 6 (six) hours as needed for moderate pain.    . simvastatin (ZOCOR) 20 MG tablet TAKE ONE TABLET BY MOUTH IN THE EVENING 30 tablet 5  .  spironolactone (ALDACTONE) 25 MG tablet Take 0.5 tablets (12.5 mg total) by mouth daily. 30 tablet 5   No current facility-administered medications for this visit.     Allergies:   Strawberry extract   Social History:  The patient  reports that she has quit smoking. She has never used smokeless tobacco. She reports that she does not drink alcohol or use drugs.   Family History:  The patient's family history includes Cancer in her mother. Family history is unclear, she thinks her mother died of bone cancer, no siblings.  ROS:  Please see the history of present  illness.  All other systems are reviewed and otherwise negative.   PHYSICAL EXAM:  VS:  There were no vitals taken for this visit. BMI: There is no height or weight on file to calculate BMI. Well nourished, well developed, in no acute distress  HEENT: normocephalic, atraumatic  Neck: no JVD, carotid bruits or masses Cardiac: *** RRR; no significant murmurs, no rubs, or gallops Lungs:  *** CTA b/l, no wheezing, rhonchi or rales  Abd: soft, nontender MS: no deformity or atrophy Ext: ***2+ edema b/l LE Skin: warm and dry, no rash Neuro:  No gross deficits appreciated Psych: euthymic mood, full affect  *** ICD site is stable, no tethering or discomfort  EKG:  Done 06/10/17 was SB, 53bpm, LAD, IVCD, appears similar to older EKGs ICD interrogation today: ***  Battery and lead measurements stable, RV lead thresholds appear chronically elevated, today slightly higher then last and output reprogrammed to 2:1 safety margin, she has <1% V pacing and made no impact on projected battery life, no observations or detections  04/19/13: TTE Study Conclusions - Left ventricle: The cavity size was normal. Wall thickness was increased in a pattern of mild LVH. Systolic function was moderately reduced. The estimated ejection fraction was in the range of 35% to 40%. Possible mild hypokinesis of the inferior myocardium. Doppler parameters are consistent with abnormal left ventricular relaxation (grade 1 diastolic dysfunction). - Mitral valve: Calcified annulus. Mildly thickened leaflets - Left atrium: The atrium was mildly dilated.  Recent Labs: 03/16/2016: Brain Natriuretic Peptide 367.0 11/15/2016: ALT 32; Hemoglobin 9.0; Platelets 195; TSH 2.350 12/09/2016: BUN 23; Creatinine, Ser 1.43; Potassium 4.4; Sodium 139  No results found for requested labs within last 8760 hours.   CrCl cannot be calculated (Unknown ideal weight.).   Wt Readings from Last 3 Encounters:  11/15/16 156 lb (70.8  kg)  08/11/16 180 lb (81.6 kg)  07/12/16 180 lb (81.6 kg)     Other studies reviewed: Additional studies/records reviewed today include: summarized above  ASSESSMENT AND PLAN:  1. NICM hx of VT w/ICD    ***Stable ICD function, RV thresholds appear chronically elevated, <1% RV pacing, reprogrammed as noted above    *** Weight is up, [place] is below threshold    On BB, lasix, no ACE/ARB given renal insufficiency    On Amio    *** 11/15/16  LFTs remain elevated, though again down from prior, TSH 2.350  2. PAFib     CHA2DS2Vasc is at least 4 on Eliquis     11/15/16 Creat 1.53     *** H/H  3. HTN     *** Stable, no changes   Disposition: ***   Current medicines are reviewed at length with the patient today.  The patient did not have any concerns regarding medicines.  Haywood Lasso, PA-C 12/27/2016 5:54 AM     [address]  [address] [place] [address] [phone] (office)  [phone] (fax)

## 2017-01-09 ENCOUNTER — Other Ambulatory Visit: Payer: Medicare Other

## 2017-01-17 ENCOUNTER — Ambulatory Visit: Payer: Medicare Other | Admitting: Physician Assistant

## 2017-01-23 ENCOUNTER — Other Ambulatory Visit: Payer: Medicare Other

## 2017-01-25 ENCOUNTER — Other Ambulatory Visit: Payer: Medicare Other

## 2017-02-01 ENCOUNTER — Other Ambulatory Visit: Payer: Medicare Other

## 2017-02-02 NOTE — Progress Notes (Addendum)
Cardiology Office Note Date:  02/03/2017  Patient ID:  Stephanie Yates, Stephanie Yates [birth year], MRN [id number] PCP:  Josetta Huddle, MD  Cardiologist:  Dr. Caryl Comes   Chief Complaint:  planned f/u  History of Present Illness: RANDELL DETTER is a 71 y.o. female with history of NICM, VT w/ICD, PAFib, HTN, HLD, CRI stage III comes in to the office today to be seen for Dr. Caryl Comes.  Last seen by him in Dec 2016 doing well back on Aldatone.  She was most recently seen by myself in May, at that time her spironolactone had been stopped, she couldn't recall why or by who, likely got missed in her in her refills, had been noticing she was getting winded with longer distances, no rest SOB, sleeping with 1 pillow comfortably.  She was found to have fluid OL her alsdactone resumed and counseled on daily weights.  She comes today feeling well.  Denies any kind of CP, palpitations, no rest SOB, no symptoms of PND or orthopnea, no difficulty with ADLS, she will get a little winded with longer walks.  No dizziness, near syncope or syncope.  Her weight is down, she is on a higher lasix dose she says via her PMD.  She doesn't weight at home bit feels like her bloating is resolved and feels well.  She denies any bleeding or signs of bleeding  Device information: SJM dual chamber ICD, implanted 12/19/12, primary prevention, Dr. Caryl Comes Chronically elevated RV threshold AAD tx: amiodarone (2014) 03/16/16: AST 137, ALT 64, (down some/stable), TSH 2.16   Past Medical History:  Diagnosis Date  . Chronic renal insufficiency, stage III (moderate)    Cr 2.0 5/15  . Chronic systolic CHF (congestive heart failure) ([place])   . History of tobacco abuse   . Hyperlipidemia   . Hypertension   . Nonischemic cardiomyopathy ([place])    EF 10-15% on 11/2012 cath  . Sinus bradycardia 12/21/2012  . Ventricular tachycardia ([place]) 12/14/2012    Past Surgical History:  Procedure Laterality Date  . IMPLANTABLE CARDIOVERTER DEFIBRILLATOR IMPLANT   12/19/2012   St. Jude dual-chamber ICD, serial number [id number]  . IMPLANTABLE CARDIOVERTER DEFIBRILLATOR IMPLANT N/A 12/19/2012   Procedure: IMPLANTABLE CARDIOVERTER DEFIBRILLATOR IMPLANT;  Surgeon: Deboraha Sprang, MD;  Location: [facility] CATH LAB;  Service: Cardiovascular;  Laterality: N/A;  . LEFT AND RIGHT HEART CATHETERIZATION WITH CORONARY ANGIOGRAM N/A 12/17/2012   Procedure: LEFT AND RIGHT HEART CATHETERIZATION WITH CORONARY ANGIOGRAM;  Surgeon: Sherren Mocha, MD;  Location: [facility] CATH LAB;  Service: Cardiovascular;  Laterality: N/A;    Current Outpatient Prescriptions  Medication Sig Dispense Refill  . amiodarone (PACERONE) 200 MG tablet Take 1 tablet (200 mg total) by mouth daily. 90 tablet 3  . carvedilol (COREG) 12.5 MG tablet Take 1 tablet (12.5 mg total) by mouth 2 (two) times daily. 180 tablet 0  . Cholecalciferol (VITAMIN D-3 PO) Take 1,000 Units by mouth 2 (two) times daily.     Marland Kitchen ELIQUIS 2.5 MG TABS tablet TAKE ONE TABLET BY MOUTH TWICE DAILY 60 tablet 11  . furosemide (LASIX) 40 MG tablet Take 1 tablet (40 mg total) by mouth daily. 30 tablet 6  . spironolactone (ALDACTONE) 25 MG tablet Take 0.5 tablets (12.5 mg total) by mouth daily. 30 tablet 5   No current facility-administered medications for this visit.     Allergies:   Strawberry extract   Social History:  The patient  reports that she has quit smoking. She has never used smokeless tobacco. She  reports that she does not drink alcohol or use drugs.   Family History:  The patient's family history includes Cancer in her mother. Family history is unclear, she thinks her mother died of bone cancer, no siblings.  ROS:  Please see the history of present illness.  All other systems are reviewed and otherwise negative.   PHYSICAL EXAM:  VS:  BP 118/60   Pulse (!) 50   Ht 5\' 5"  (1.651 m)   Wt 139 lb 12.8 oz (63.4 kg)   SpO2 98%   BMI 23.26 kg/m  BMI: Body mass index is 23.26 kg/m. Well nourished, well developed, in no acute  distress  HEENT: normocephalic, atraumatic  Neck: no JVD, carotid bruits or masses Cardiac: RRR; no significant murmurs, no rubs, or gallops Lungs:  CTA b/l, no wheezing, rhonchi or rales  Abd: soft, nontender MS: no deformity, age appropriate atrophy Ext: no edema b/l LE Skin: warm and dry, no rash Neuro:  No gross deficits appreciated Psych: euthymic mood, full affect  ICD site is stable, no tethering or discomfort  EKG:  Done today and reviewed by myself is A Paced, v sensed ICD interrogation today by industry and reviewed by myself:  Battery and lead measurements are stable, RV threshold is elevated, unchanged from last year, 1.5% V pacing, no AMS or VT  04/19/13: TTE Study Conclusions - Left ventricle: The cavity size was normal. Wall thickness was increased in a pattern of mild LVH. Systolic function was moderately reduced. The estimated ejection fraction was in the range of 35% to 40%. Possible mild hypokinesis of the inferior myocardium. Doppler parameters are consistent with abnormal left ventricular relaxation (grade 1 diastolic dysfunction). - Mitral valve: Calcified annulus. Mildly thickened leaflets - Left atrium: The atrium was mildly dilated.  Recent Labs: 03/16/2016: Brain Natriuretic Peptide 367.0 11/15/2016: ALT 32; Hemoglobin 9.0; Platelets 195; TSH 2.350 12/09/2016: BUN 23; Creatinine, Ser 1.43; Potassium 4.4; Sodium 139  No results found for requested labs within last 8760 hours.   CrCl cannot be calculated (Patient's most recent lab result is older than the maximum 21 days allowed.).   Wt Readings from Last 3 Encounters:  02/03/17 139 lb 12.8 oz (63.4 kg)  11/15/16 156 lb (70.8 kg)  08/11/16 180 lb (81.6 kg)     Other studies reviewed: Additional studies/records reviewed today include: summarized above  ASSESSMENT AND PLAN:  1. NICM hx of VT w/ICD     Stable ICD function, RV thresholds appear chronically elevated  2. Chronic CHF     Exam is euvolemic, weight is down, [place] is below threshold    On BB, lasix, no ACE/ARB given renal insufficiency    On Amio    11/15/16 AST 73 (lower then [riors), ALT wnl, TSH wnl, no SOB, gets annual eye exams    BMET today with increased lasix (no labs elsewhere per the pt)  3. PAFib     CHA2DS2Vasc is at least 4 on Eliquis     Given age and weight, 5mg  dose is more appropriate.  It appears that she has been maintained on 2.5 over the last couple years.  This was not noted until after her visit, not discussed with the patient today.  I have placed a call to the patient to discuss.  4. HTN     Stable, no changes   Disposition: She prefers in clinic device check with plan device clinic in 3 months, Dr. Caryl Comes in 6 months sooner if needed.  Current medicines are  reviewed at length with the patient today.  The patient did not have any concerns regarding medicines.  Haywood Lasso, PA-C 02/03/2017 4:55 PM     [address] [place] [address] [phone] (office)  [phone] (fax)

## 2017-02-03 ENCOUNTER — Encounter: Payer: Self-pay | Admitting: Physician Assistant

## 2017-02-03 ENCOUNTER — Ambulatory Visit (INDEPENDENT_AMBULATORY_CARE_PROVIDER_SITE_OTHER): Payer: Medicare Other | Admitting: Physician Assistant

## 2017-02-03 ENCOUNTER — Telehealth: Payer: Self-pay | Admitting: Physician Assistant

## 2017-02-03 VITALS — BP 118/60 | HR 50 | Ht 65.0 in | Wt 139.8 lb

## 2017-02-03 DIAGNOSIS — I10 Essential (primary) hypertension: Secondary | ICD-10-CM | POA: Diagnosis not present

## 2017-02-03 DIAGNOSIS — Z79899 Other long term (current) drug therapy: Secondary | ICD-10-CM | POA: Diagnosis not present

## 2017-02-03 DIAGNOSIS — Z95810 Presence of automatic (implantable) cardiac defibrillator: Secondary | ICD-10-CM | POA: Diagnosis not present

## 2017-02-03 DIAGNOSIS — I480 Paroxysmal atrial fibrillation: Secondary | ICD-10-CM

## 2017-02-03 DIAGNOSIS — I428 Other cardiomyopathies: Secondary | ICD-10-CM

## 2017-02-03 NOTE — Patient Instructions (Addendum)
Medication Instructions:   Your physician recommends that you continue on your current medications as directed. Please refer to the Current Medication list given to you today.   If you need a refill on your cardiac medications before your next appointment, please call your pharmacy.  Labwork:  BMET TODAY     Testing/Procedures: NONE ORDERED  TODAY   Follow-Up:  Your physician wants you to follow-up in:  IN [place] will receive a reminder letter in the mail two months in advance. If you don't receive a letter, please call our office to schedule the follow-up appointment.   .IN 3 MONTHS WITH DEVICE CLINIC   Any Other Special Instructions Will Be Listed Below (If Applicable).

## 2017-02-03 NOTE — Telephone Encounter (Signed)
Left a message for the patient that I wanted to discuss a medication that we did not talk about at her visit today.  To call back when she was able.   Tommye Standard, PA-c

## 2017-02-04 LAB — BASIC METABOLIC PANEL
BUN/Creatinine Ratio: 19 (ref 12–28)
BUN: 65 mg/dL — AB (ref 8–27)
CALCIUM: 9.1 mg/dL (ref 8.7–10.3)
CHLORIDE: 98 mmol/L (ref 96–106)
CO2: 20 mmol/L (ref 20–29)
Creatinine, Ser: 3.46 mg/dL — ABNORMAL HIGH (ref 0.57–1.00)
GFR, EST AFRICAN AMERICAN: 14 mL/min/{1.73_m2} — AB (ref >59)
GFR, EST NON AFRICAN AMERICAN: 12 mL/min/{1.73_m2} — AB (ref >59)
Glucose: 78 mg/dL (ref 65–99)
Potassium: 4.9 mmol/L (ref 3.5–5.2)
Sodium: 136 mmol/L (ref 134–144)

## 2017-02-06 ENCOUNTER — Other Ambulatory Visit: Payer: Self-pay | Admitting: Physician Assistant

## 2017-02-06 ENCOUNTER — Telehealth: Payer: Self-pay | Admitting: Physician Assistant

## 2017-02-06 DIAGNOSIS — Z79899 Other long term (current) drug therapy: Secondary | ICD-10-CM

## 2017-02-06 NOTE — Telephone Encounter (Signed)
Called the patient in regards to her BMET, she is feeling very well.  Her current lasix dose is 80mg  QD.  She reports "normal" urine out put/frequencey, color she states is like light sweet tea "like I need to drink more water", not dark brown or discolored.  Given she is feeling well, will have her get BMET tomorrow, we went over her medicines, she is going to stop her furosemide and spironolactone until further notice, she can come in to the office tomorrow for lab draw, pending this for further.  Discussed may need to get frequent labs to monitor kidney function, she reports she will be able to.  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  Given she is feeling well with reports of stable urin output/frequency will follow closely off her diuretics.  She is encouraged to keep adequate hydration and monitor daily weights.   Tommye Standard, PA-C

## 2017-02-07 ENCOUNTER — Other Ambulatory Visit: Payer: Medicare Other

## 2017-02-07 ENCOUNTER — Other Ambulatory Visit: Payer: Self-pay | Admitting: *Deleted

## 2017-02-07 DIAGNOSIS — N289 Disorder of kidney and ureter, unspecified: Secondary | ICD-10-CM

## 2017-02-08 ENCOUNTER — Other Ambulatory Visit: Payer: Medicare Other | Admitting: *Deleted

## 2017-02-08 ENCOUNTER — Other Ambulatory Visit: Payer: Self-pay | Admitting: *Deleted

## 2017-02-08 ENCOUNTER — Other Ambulatory Visit: Payer: Medicare Other

## 2017-02-08 DIAGNOSIS — N289 Disorder of kidney and ureter, unspecified: Secondary | ICD-10-CM

## 2017-02-09 ENCOUNTER — Telehealth: Payer: Self-pay | Admitting: *Deleted

## 2017-02-09 LAB — BASIC METABOLIC PANEL
BUN/Creatinine Ratio: 20 (ref 12–28)
BUN: 52 mg/dL — AB (ref 8–27)
CALCIUM: 9.2 mg/dL (ref 8.7–10.3)
CO2: 23 mmol/L (ref 20–29)
CREATININE: 2.65 mg/dL — AB (ref 0.57–1.00)
Chloride: 104 mmol/L (ref 96–106)
GFR calc Af Amer: 20 mL/min/{1.73_m2} — ABNORMAL LOW (ref >59)
GFR, EST NON AFRICAN AMERICAN: 17 mL/min/{1.73_m2} — AB (ref >59)
Glucose: 98 mg/dL (ref 65–99)
POTASSIUM: 4.8 mmol/L (ref 3.5–5.2)
Sodium: 137 mmol/L (ref 134–144)

## 2017-02-09 NOTE — Telephone Encounter (Signed)
-----   Message from [name], [place] sent at 02/09/2017  8:36 AM EDT ----- Regarding: FW: LAB REORDERD STAT AND PUT ON SCHEDULE    ----- Message ----- From: Claude Manges, CMA Sent: 02/08/2017   8:41 AM To: [name], PA-C Subject: RE: LAB REORDERD STAT AND PUT ON SCHEDULE      Spoke to the pt about results.  She mentioned her son has cancer and she was with him at [facility] yesterday which she couldn't make it to lab.  She's going to try and make it today  But if not she will call and ask to speak to me.   ----- Message ----- From: [name], PA-C Sent: 02/07/2017   2:10 PM To: Claude Manges, CMA Subject: RE: LAB REORDERD STAT AND PUT ON SCHEDULE      THANK YOU!!!  ----- Message ----- From: Claude Manges, CMA Sent: 02/07/2017   8:51 AM To: [name], PA-C Subject: LAB REORDERD STAT AND PUT ON SCHEDULE            ----- Message ----- From: [name], PA-C Sent: 02/06/2017   6:51 PM To: Claude Manges, CMA  Please update the patient's medicine list with lasix 80mg  daily (what she is actually taking) and she is coming in Tuesday (02/07/17) about 3pm for BMET, I ordered it if you can get her on the schedule.  Please have them run it stat so I get a result by end of day.  THANKS [name]

## 2017-02-09 NOTE — Telephone Encounter (Signed)
Lmtcb.  Spoke with Stephanie Yates via telephone -- order for BMET Monday, next week. When pt calls back, will check how she is feeling/? wt gain/edema.

## 2017-02-10 NOTE — Telephone Encounter (Signed)
Reviewed results with patient who verbalized understanding.  Pt tells me that she is still experiencing some edema and SOB.  Advised pt to take Lasix 40 mg once daily for up to the next 3 days, per Tommye Standard, [place]. Advised not to take Aldactone. Pt will stop by the office Monday for follow up BMET.

## 2017-02-10 NOTE — Telephone Encounter (Signed)
-----   Message from [facility], [place] sent at 02/09/2017  8:42 AM EDT ----- Please let her know her kidney function is improved, but still not back to baseline.  Inquire about fluid status, if she is feeling well without SOB/edema, please have the patient stay off her aldactone and lasix and recheck BMET on Monday.   Monitoring herself closely for weight gain, edema, SOB.  If she develops any symptoms/signs of fluid retention to notify us and we can address her diuretics, otherwise BMET on Monday and we will go from there.  THANKS renee

## 2017-02-13 ENCOUNTER — Other Ambulatory Visit: Payer: Medicare Other

## 2017-02-13 ENCOUNTER — Telehealth: Payer: Self-pay | Admitting: Internal Medicine

## 2017-02-13 NOTE — Telephone Encounter (Signed)
Patient called to follow up on whether or not she is to go back on lasix. Patient advised that she was due for lab work today to determine whether she can continue lasix. Patient said she forgot to come for lab work. Lab work appointment rescheduled for today at 2:00 pm. Patient informed and verbalized understanding.

## 2017-02-13 NOTE — Telephone Encounter (Signed)
New Message  Pt call requesting to speak with RN. Pt states she is taking lasix 80mg . Pt would like to know how long she will need to stay on medication. Please call back to discuss

## 2017-02-17 ENCOUNTER — Other Ambulatory Visit: Payer: Medicare Other

## 2017-02-17 ENCOUNTER — Telehealth: Payer: Self-pay | Admitting: Physician Assistant

## 2017-02-17 DIAGNOSIS — N289 Disorder of kidney and ureter, unspecified: Secondary | ICD-10-CM

## 2017-02-17 NOTE — Telephone Encounter (Signed)
Opened in error

## 2017-02-18 LAB — BASIC METABOLIC PANEL
BUN/Creatinine Ratio: 17 (ref 12–28)
BUN: 28 mg/dL — ABNORMAL HIGH (ref 8–27)
CALCIUM: 8.6 mg/dL — AB (ref 8.7–10.3)
CO2: 20 mmol/L (ref 20–29)
CREATININE: 1.65 mg/dL — AB (ref 0.57–1.00)
Chloride: 106 mmol/L (ref 96–106)
GFR calc Af Amer: 35 mL/min/{1.73_m2} — ABNORMAL LOW (ref >59)
GFR calc non Af Amer: 31 mL/min/{1.73_m2} — ABNORMAL LOW (ref >59)
GLUCOSE: 85 mg/dL (ref 65–99)
POTASSIUM: 3.7 mmol/L (ref 3.5–5.2)
SODIUM: 141 mmol/L (ref 134–144)

## 2017-02-23 ENCOUNTER — Telehealth: Payer: Self-pay | Admitting: *Deleted

## 2017-02-23 NOTE — Telephone Encounter (Signed)
-----   Message from [name], [place] sent at 02/20/2017  7:18 AM EDT ----- Please let the patient know her labs look much better.  She can resume her furosemide at 40mg  daily with her spironolactone 12.5mg  daily and recheck her BMET in 7-10 days please.   Thanks renee

## 2017-02-23 NOTE — Telephone Encounter (Signed)
LMOVM TO CALL BACK TO SET UP  REPEAT BMET LAB APPT FOR LAB FOLLOW UP

## 2017-02-28 ENCOUNTER — Telehealth: Payer: Self-pay | Admitting: *Deleted

## 2017-02-28 DIAGNOSIS — Z79899 Other long term (current) drug therapy: Secondary | ICD-10-CM

## 2017-02-28 NOTE — Telephone Encounter (Signed)
Follow up   Pt calling back

## 2017-02-28 NOTE — Telephone Encounter (Signed)
PT AWARE OF RESULTS AND TO TAKE LASIX 40 MG AND SPRINOLACTONE 12.5 MG ONCE A DAY  AND RETURN FOR LABS  IN 7- 10 DAYS.  PT WILL RETURN FOR LABS ON 03-10-17

## 2017-03-04 ENCOUNTER — Encounter (HOSPITAL_COMMUNITY): Payer: Self-pay | Admitting: *Deleted

## 2017-03-04 ENCOUNTER — Inpatient Hospital Stay (HOSPITAL_COMMUNITY)
Admission: EM | Admit: 2017-03-04 | Discharge: 2017-03-13 | DRG: 291 | Disposition: A | Discharge status: Home / Self Care | Payer: Medicare Other | Attending: Internal Medicine | Admitting: Internal Medicine

## 2017-03-04 ENCOUNTER — Emergency Department (HOSPITAL_COMMUNITY): Payer: Medicare Other

## 2017-03-04 DIAGNOSIS — D509 Iron deficiency anemia, unspecified: Secondary | ICD-10-CM | POA: Diagnosis present

## 2017-03-04 DIAGNOSIS — T501X5A Adverse effect of loop [high-ceiling] diuretics, initial encounter: Secondary | ICD-10-CM | POA: Diagnosis present

## 2017-03-04 DIAGNOSIS — I429 Cardiomyopathy, unspecified: Secondary | ICD-10-CM | POA: Diagnosis present

## 2017-03-04 DIAGNOSIS — Z87891 Personal history of nicotine dependence: Secondary | ICD-10-CM

## 2017-03-04 DIAGNOSIS — I482 Chronic atrial fibrillation: Secondary | ICD-10-CM | POA: Diagnosis present

## 2017-03-04 DIAGNOSIS — M1711 Unilateral primary osteoarthritis, right knee: Secondary | ICD-10-CM | POA: Diagnosis present

## 2017-03-04 DIAGNOSIS — I130 Hypertensive heart and chronic kidney disease with heart failure and stage 1 through stage 4 chronic kidney disease, or unspecified chronic kidney disease: Secondary | ICD-10-CM | POA: Diagnosis not present

## 2017-03-04 DIAGNOSIS — Z95810 Presence of automatic (implantable) cardiac defibrillator: Secondary | ICD-10-CM | POA: Diagnosis present

## 2017-03-04 DIAGNOSIS — I509 Heart failure, unspecified: Secondary | ICD-10-CM

## 2017-03-04 DIAGNOSIS — R778 Other specified abnormalities of plasma proteins: Secondary | ICD-10-CM

## 2017-03-04 DIAGNOSIS — N183 Chronic kidney disease, stage 3 unspecified: Secondary | ICD-10-CM | POA: Diagnosis present

## 2017-03-04 DIAGNOSIS — R079 Chest pain, unspecified: Secondary | ICD-10-CM | POA: Diagnosis present

## 2017-03-04 DIAGNOSIS — R0602 Shortness of breath: Secondary | ICD-10-CM | POA: Diagnosis not present

## 2017-03-04 DIAGNOSIS — D638 Anemia in other chronic diseases classified elsewhere: Secondary | ICD-10-CM | POA: Diagnosis present

## 2017-03-04 DIAGNOSIS — M25569 Pain in unspecified knee: Secondary | ICD-10-CM

## 2017-03-04 DIAGNOSIS — Z9114 Patient's other noncompliance with medication regimen: Secondary | ICD-10-CM

## 2017-03-04 DIAGNOSIS — M109 Gout, unspecified: Secondary | ICD-10-CM | POA: Diagnosis present

## 2017-03-04 DIAGNOSIS — E785 Hyperlipidemia, unspecified: Secondary | ICD-10-CM | POA: Diagnosis present

## 2017-03-04 DIAGNOSIS — R7989 Other specified abnormal findings of blood chemistry: Secondary | ICD-10-CM

## 2017-03-04 DIAGNOSIS — I5043 Acute on chronic combined systolic (congestive) and diastolic (congestive) heart failure: Secondary | ICD-10-CM | POA: Diagnosis present

## 2017-03-04 DIAGNOSIS — Z7901 Long term (current) use of anticoagulants: Secondary | ICD-10-CM

## 2017-03-04 DIAGNOSIS — I248 Other forms of acute ischemic heart disease: Secondary | ICD-10-CM | POA: Diagnosis present

## 2017-03-04 DIAGNOSIS — Z9111 Patient's noncompliance with dietary regimen: Secondary | ICD-10-CM

## 2017-03-04 DIAGNOSIS — D649 Anemia, unspecified: Secondary | ICD-10-CM | POA: Diagnosis present

## 2017-03-04 DIAGNOSIS — I10 Essential (primary) hypertension: Secondary | ICD-10-CM | POA: Diagnosis present

## 2017-03-04 DIAGNOSIS — I480 Paroxysmal atrial fibrillation: Secondary | ICD-10-CM | POA: Diagnosis present

## 2017-03-04 DIAGNOSIS — R7401 Elevation of levels of liver transaminase levels: Secondary | ICD-10-CM | POA: Diagnosis present

## 2017-03-04 DIAGNOSIS — I2510 Atherosclerotic heart disease of native coronary artery without angina pectoris: Secondary | ICD-10-CM | POA: Diagnosis present

## 2017-03-04 LAB — CBC
HCT: 29.3 % — ABNORMAL LOW (ref 36.0–46.0)
HEMOGLOBIN: 9.6 g/dL — AB (ref 12.0–15.0)
MCH: 24.9 pg — AB (ref 26.0–34.0)
MCHC: 32.8 g/dL (ref 30.0–36.0)
MCV: 75.9 fL — AB (ref 78.0–100.0)
Platelets: 217 10*3/uL (ref 150–400)
RBC: 3.86 MIL/uL — AB (ref 3.87–5.11)
RDW: 22.1 % — ABNORMAL HIGH (ref 11.5–15.5)
WBC: 6.2 10*3/uL (ref 4.0–10.5)

## 2017-03-04 LAB — BASIC METABOLIC PANEL
ANION GAP: 8 (ref 5–15)
BUN: 23 mg/dL — ABNORMAL HIGH (ref 6–20)
CHLORIDE: 101 mmol/L (ref 101–111)
CO2: 21 mmol/L — AB (ref 22–32)
CREATININE: 1.76 mg/dL — AB (ref 0.44–1.00)
Calcium: 8.2 mg/dL — ABNORMAL LOW (ref 8.9–10.3)
GFR calc non Af Amer: 28 mL/min — ABNORMAL LOW (ref >60)
GFR, EST AFRICAN AMERICAN: 32 mL/min — AB (ref >60)
GLUCOSE: 84 mg/dL (ref 65–99)
Potassium: 3.9 mmol/L (ref 3.5–5.1)
Sodium: 130 mmol/L — ABNORMAL LOW (ref 135–145)

## 2017-03-04 LAB — TROPONIN I: Troponin I: 0.13 ng/mL (ref <0.03)

## 2017-03-04 LAB — BRAIN NATRIURETIC PEPTIDE: B Natriuretic Peptide: 885.7 pg/mL — ABNORMAL HIGH (ref 0.0–100.0)

## 2017-03-04 MED ORDER — ASPIRIN 81 MG PO CHEW
324.0000 mg | CHEWABLE_TABLET | Freq: Once | ORAL | Status: AC
  Administered 2017-03-04: 324 mg via ORAL
  Filled 2017-03-04: qty 4

## 2017-03-04 MED ORDER — MORPHINE SULFATE (PF) 4 MG/ML IV SOLN
4.0000 mg | Freq: Once | INTRAVENOUS | Status: AC
  Administered 2017-03-04: 4 mg via INTRAVENOUS
  Filled 2017-03-04: qty 1

## 2017-03-04 NOTE — ED Notes (Signed)
Dr. Vanita Panda informed of troponin 0.13. Will work on getting room.

## 2017-03-04 NOTE — ED Notes (Signed)
Per sheila print paper and tube down

## 2017-03-04 NOTE — ED Triage Notes (Signed)
The pt has a aicd

## 2017-03-04 NOTE — ED Provider Notes (Signed)
[facility] DEPT Provider Note   CSN: [id number] Arrival date & time: 03/04/17  2014     History   Chief Complaint Chief Complaint  Patient presents with  . Chest Pain    HPI Stephanie Yates is a 71 y.o. female.  Patient with PMH of afib, HTN, CHF, renal insufficiency, presents to the ED with a chief complaint of chest pain.  She states that she has been out of her medications for the past 5 days.  She states that she has had some associated increased lower extremity swelling.  She states that currently her chest pain is 5/10.  She has not taken anything for her symptoms.  She denies any other associated symptoms.   The history is provided by the patient. No language interpreter was used.    Past Medical History:  Diagnosis Date  . Chronic renal insufficiency, stage III (moderate)    Cr 2.0 5/15  . Chronic systolic CHF (congestive heart failure) ([place])   . History of tobacco abuse   . Hyperlipidemia   . Hypertension   . Nonischemic cardiomyopathy ([place])    EF 10-15% on 11/2012 cath  . Sinus bradycardia 12/21/2012  . Ventricular tachycardia ([place]) 12/14/2012    Patient Active Problem List   Diagnosis Date Noted  . Atrial fibrillation ([place]) 04/01/2013  . ICD (implantable cardioverter-defibrillator), dual, st Judes 04/01/2013  . PVC's (premature ventricular contractions) 04/01/2013  . Hypokalemia 12/21/2012  . History of tobacco abuse 12/21/2012  . Elevated transaminase level 12/21/2012  . Sinus bradycardia 12/21/2012  . Hyperkalemia 12/20/2012  . Nonischemic cardiomyopathy ([place]) 12/20/2012  . UTI (urinary tract infection) 12/20/2012  . Chronic systolic heart failure ([place]) 12/20/2012  . Ventricular tachycardia ([place]) 12/14/2012  . Hypertension 12/14/2012    Past Surgical History:  Procedure Laterality Date  . IMPLANTABLE CARDIOVERTER DEFIBRILLATOR IMPLANT  12/19/2012   St. Jude dual-chamber ICD, serial number [id number]  . IMPLANTABLE CARDIOVERTER DEFIBRILLATOR IMPLANT  N/A 12/19/2012   Procedure: IMPLANTABLE CARDIOVERTER DEFIBRILLATOR IMPLANT;  Surgeon: Deboraha Sprang, MD;  Location: [facility] CATH LAB;  Service: Cardiovascular;  Laterality: N/A;  . LEFT AND RIGHT HEART CATHETERIZATION WITH CORONARY ANGIOGRAM N/A 12/17/2012   Procedure: LEFT AND RIGHT HEART CATHETERIZATION WITH CORONARY ANGIOGRAM;  Surgeon: Sherren Mocha, MD;  Location: [facility] CATH LAB;  Service: Cardiovascular;  Laterality: N/A;    OB History    No data available       Home Medications    Prior to Admission medications   Medication Sig Start Date End Date Taking? Authorizing Provider  amiodarone (PACERONE) 200 MG tablet Take 1 tablet (200 mg total) by mouth daily. 06/07/16   Deboraha Sprang, MD  carvedilol (COREG) 12.5 MG tablet Take 1 tablet (12.5 mg total) by mouth 2 (two) times daily. 06/30/16   Deboraha Sprang, MD  Cholecalciferol (VITAMIN D-3 PO) Take 1,000 Units by mouth 2 (two) times daily.     [provider]  ELIQUIS 2.5 MG TABS tablet TAKE ONE TABLET BY MOUTH TWICE DAILY 03/18/16   Deboraha Sprang, MD  furosemide (LASIX) 40 MG tablet Take 1 tablet (40 mg total) by mouth daily. 03/14/16   Sherren Mocha, MD  spironolactone (ALDACTONE) 25 MG tablet Take 0.5 tablets (12.5 mg total) by mouth daily. 11/15/16   [name], PA-C    Family History Family History  Problem Relation Age of Onset  . Cancer Mother     Social History Social History  Substance Use Topics  . Smoking status: Former  Smoker  . Smokeless tobacco: Never Used  . Alcohol use No     Allergies   Strawberry extract   Review of Systems Review of Systems  All other systems reviewed and are negative.    Physical Exam Updated Vital Signs BP (!) 155/59   Pulse 60   Temp 98.1 F (36.7 C) (Oral)   Resp 19   Ht 5\' 6"  (1.676 m)   Wt 63.5 kg (140 lb)   SpO2 99%   BMI 22.60 kg/m   Physical Exam  Constitutional: She is oriented to person, place, and time. She appears well-developed and  well-nourished.  HENT:  Head: Normocephalic and atraumatic.  Eyes: Pupils are equal, round, and reactive to light. Conjunctivae and EOM are normal.  Neck: Normal range of motion. Neck supple.  Cardiovascular: Normal rate and regular rhythm.  Exam reveals no gallop and no friction rub.   No murmur heard. Pulmonary/Chest: Effort normal and breath sounds normal. No respiratory distress. She has no wheezes. She has no rales. She exhibits no tenderness.  Abdominal: Soft. Bowel sounds are normal. She exhibits no distension and no mass. There is no tenderness. There is no rebound and no guarding.  Musculoskeletal: Normal range of motion. She exhibits no edema or tenderness.  Neurological: She is alert and oriented to person, place, and time.  Skin: Skin is warm and dry.  Psychiatric: She has a normal mood and affect. Her behavior is normal. Judgment and thought content normal.  Nursing note and vitals reviewed.    ED Treatments / Results  Labs (all labs ordered are listed, but only abnormal results are displayed) Labs Reviewed  BASIC METABOLIC PANEL - Abnormal; Notable for the following:       Result Value   Sodium 130 (*)    CO2 21 (*)    BUN 23 (*)    Creatinine, Ser 1.76 (*)    Calcium 8.2 (*)    GFR calc non Af Amer 28 (*)    GFR calc Af Amer 32 (*)    All other components within normal limits  CBC - Abnormal; Notable for the following:    RBC 3.86 (*)    Hemoglobin 9.6 (*)    HCT 29.3 (*)    MCV 75.9 (*)    MCH 24.9 (*)    RDW 22.1 (*)    All other components within normal limits  TROPONIN I - Abnormal; Notable for the following:    Troponin I 0.13 (*)    All other components within normal limits  BRAIN NATRIURETIC PEPTIDE    EKG  EKG Interpretation None       Radiology Dg Chest 2 View  Result Date: 03/04/2017 CLINICAL DATA:  Chest pain EXAM: CHEST  2 VIEW COMPARISON:  09/30/2013 FINDINGS: Left-sided duo lead pacing device as before. Small pleural effusions.  Cardiomegaly with central vascular congestion and diffuse interstitial opacity consistent with mild edema. Aortic atherosclerosis. No pneumothorax. IMPRESSION: Cardiomegaly with tiny pleural effusions, vascular congestion and mild diffuse pulmonary edema Electronically Signed   By: Donavan Foil M.D.   On: 03/04/2017 21:30    Procedures Procedures (including critical care time)  Medications Ordered in ED Medications  morphine 4 MG/ML injection 4 mg (not administered)     Initial Impression / Assessment and Plan / ED Course  I have reviewed the triage vital signs and the nursing notes.  Pertinent labs & imaging results that were available during my care of the patient were reviewed by me  and considered in my medical decision making (see chart for details).     Patient with chest pain and SOB.  Hx of CHF.  Has been out of her meds for 5 days.  Increased leg swelling.   CXR shows vascular congestion and mild diffuse pulmonary edema. BNP is 885.  Mildly elevated troponin 0.13, likely demand.  Will need to admit for diuresis.  Will give lasix in ED.  Discussed with Dr. Venora Maples, who agrees with the plan.  Appreciate Dr. Blaine Hamper for admitting the patient.  Final Clinical Impressions(s) / ED Diagnoses   Final diagnoses:  Acute on chronic congestive heart failure, unspecified heart failure type ([place])  Elevated troponin  Chest pain, unspecified type    New Prescriptions New Prescriptions   No medications on file     Montine Circle, Hershal Coria 03/05/17 [place], Kevin, MD 03/05/17 [phone]

## 2017-03-04 NOTE — ED Triage Notes (Signed)
The pt is c/o some mid-chest soreness  She has not had any heart meds since Monday.   Some mild sob  None at present

## 2017-03-05 ENCOUNTER — Encounter (HOSPITAL_COMMUNITY): Payer: Self-pay | Admitting: General Practice

## 2017-03-05 DIAGNOSIS — N189 Chronic kidney disease, unspecified: Secondary | ICD-10-CM | POA: Diagnosis not present

## 2017-03-05 DIAGNOSIS — I214 Non-ST elevation (NSTEMI) myocardial infarction: Secondary | ICD-10-CM | POA: Diagnosis not present

## 2017-03-05 DIAGNOSIS — I480 Paroxysmal atrial fibrillation: Secondary | ICD-10-CM | POA: Diagnosis not present

## 2017-03-05 DIAGNOSIS — N170 Acute kidney failure with tubular necrosis: Secondary | ICD-10-CM | POA: Diagnosis not present

## 2017-03-05 DIAGNOSIS — I10 Essential (primary) hypertension: Secondary | ICD-10-CM | POA: Diagnosis not present

## 2017-03-05 DIAGNOSIS — Z87891 Personal history of nicotine dependence: Secondary | ICD-10-CM | POA: Diagnosis not present

## 2017-03-05 DIAGNOSIS — Z9111 Patient's noncompliance with dietary regimen: Secondary | ICD-10-CM | POA: Diagnosis not present

## 2017-03-05 DIAGNOSIS — N179 Acute kidney failure, unspecified: Secondary | ICD-10-CM | POA: Diagnosis not present

## 2017-03-05 DIAGNOSIS — E7800 Pure hypercholesterolemia, unspecified: Secondary | ICD-10-CM | POA: Diagnosis not present

## 2017-03-05 DIAGNOSIS — I5023 Acute on chronic systolic (congestive) heart failure: Secondary | ICD-10-CM

## 2017-03-05 DIAGNOSIS — I340 Nonrheumatic mitral (valve) insufficiency: Secondary | ICD-10-CM | POA: Diagnosis not present

## 2017-03-05 DIAGNOSIS — I429 Cardiomyopathy, unspecified: Secondary | ICD-10-CM | POA: Diagnosis present

## 2017-03-05 DIAGNOSIS — E785 Hyperlipidemia, unspecified: Secondary | ICD-10-CM | POA: Diagnosis not present

## 2017-03-05 DIAGNOSIS — R079 Chest pain, unspecified: Secondary | ICD-10-CM | POA: Diagnosis not present

## 2017-03-05 DIAGNOSIS — Z95810 Presence of automatic (implantable) cardiac defibrillator: Secondary | ICD-10-CM | POA: Diagnosis not present

## 2017-03-05 DIAGNOSIS — I5043 Acute on chronic combined systolic (congestive) and diastolic (congestive) heart failure: Secondary | ICD-10-CM

## 2017-03-05 DIAGNOSIS — T501X5A Adverse effect of loop [high-ceiling] diuretics, initial encounter: Secondary | ICD-10-CM | POA: Diagnosis present

## 2017-03-05 DIAGNOSIS — I482 Chronic atrial fibrillation: Secondary | ICD-10-CM | POA: Diagnosis not present

## 2017-03-05 DIAGNOSIS — I110 Hypertensive heart disease with heart failure: Secondary | ICD-10-CM | POA: Diagnosis not present

## 2017-03-05 DIAGNOSIS — I248 Other forms of acute ischemic heart disease: Secondary | ICD-10-CM | POA: Diagnosis present

## 2017-03-05 DIAGNOSIS — R0789 Other chest pain: Secondary | ICD-10-CM | POA: Diagnosis not present

## 2017-03-05 DIAGNOSIS — M25569 Pain in unspecified knee: Secondary | ICD-10-CM | POA: Diagnosis not present

## 2017-03-05 DIAGNOSIS — D638 Anemia in other chronic diseases classified elsewhere: Secondary | ICD-10-CM | POA: Diagnosis present

## 2017-03-05 DIAGNOSIS — M109 Gout, unspecified: Secondary | ICD-10-CM | POA: Diagnosis present

## 2017-03-05 DIAGNOSIS — R609 Edema, unspecified: Secondary | ICD-10-CM | POA: Diagnosis not present

## 2017-03-05 DIAGNOSIS — I130 Hypertensive heart and chronic kidney disease with heart failure and stage 1 through stage 4 chronic kidney disease, or unspecified chronic kidney disease: Secondary | ICD-10-CM | POA: Diagnosis present

## 2017-03-05 DIAGNOSIS — N183 Chronic kidney disease, stage 3 unspecified: Secondary | ICD-10-CM | POA: Diagnosis present

## 2017-03-05 DIAGNOSIS — R0602 Shortness of breath: Secondary | ICD-10-CM | POA: Diagnosis present

## 2017-03-05 DIAGNOSIS — I2510 Atherosclerotic heart disease of native coronary artery without angina pectoris: Secondary | ICD-10-CM | POA: Diagnosis present

## 2017-03-05 DIAGNOSIS — I509 Heart failure, unspecified: Secondary | ICD-10-CM | POA: Diagnosis not present

## 2017-03-05 DIAGNOSIS — R748 Abnormal levels of other serum enzymes: Secondary | ICD-10-CM | POA: Diagnosis not present

## 2017-03-05 DIAGNOSIS — Z9114 Patient's other noncompliance with medication regimen: Secondary | ICD-10-CM | POA: Diagnosis not present

## 2017-03-05 DIAGNOSIS — D649 Anemia, unspecified: Secondary | ICD-10-CM | POA: Diagnosis present

## 2017-03-05 DIAGNOSIS — D509 Iron deficiency anemia, unspecified: Secondary | ICD-10-CM | POA: Diagnosis present

## 2017-03-05 DIAGNOSIS — Z7901 Long term (current) use of anticoagulants: Secondary | ICD-10-CM | POA: Diagnosis not present

## 2017-03-05 DIAGNOSIS — M10261 Drug-induced gout, right knee: Secondary | ICD-10-CM | POA: Diagnosis not present

## 2017-03-05 DIAGNOSIS — M1711 Unilateral primary osteoarthritis, right knee: Secondary | ICD-10-CM | POA: Diagnosis not present

## 2017-03-05 LAB — TROPONIN I
TROPONIN I: 0.12 ng/mL — AB (ref <0.03)
Troponin I: 0.13 ng/mL (ref <0.03)
Troponin I: 0.12 ng/mL (ref <0.03)

## 2017-03-05 LAB — HEMOGLOBIN A1C
HEMOGLOBIN A1C: 5.2 % (ref 4.8–5.6)
MEAN PLASMA GLUCOSE: 102.54 mg/dL

## 2017-03-05 LAB — HEPARIN LEVEL (UNFRACTIONATED): Heparin Unfractionated: 1.36 IU/mL — ABNORMAL HIGH (ref 0.30–0.70)

## 2017-03-05 LAB — LIPID PANEL
Cholesterol: 132 mg/dL (ref 0–200)
HDL: 16 mg/dL — AB (ref >40)
LDL Cholesterol: 101 mg/dL — ABNORMAL HIGH (ref 0–99)
TRIGLYCERIDES: 74 mg/dL (ref <150)
Total CHOL/HDL Ratio: 8.3 RATIO
VLDL: 15 mg/dL (ref 0–40)

## 2017-03-05 LAB — CBC
HCT: 26.2 % — ABNORMAL LOW (ref 36.0–46.0)
Hemoglobin: 8.7 g/dL — ABNORMAL LOW (ref 12.0–15.0)
MCH: 24.9 pg — AB (ref 26.0–34.0)
MCHC: 33.2 g/dL (ref 30.0–36.0)
MCV: 75.1 fL — AB (ref 78.0–100.0)
PLATELETS: 203 10*3/uL (ref 150–400)
RBC: 3.49 MIL/uL — ABNORMAL LOW (ref 3.87–5.11)
RDW: 21.9 % — AB (ref 11.5–15.5)
WBC: 6.2 10*3/uL (ref 4.0–10.5)

## 2017-03-05 LAB — APTT: aPTT: 149 seconds — ABNORMAL HIGH (ref 24–36)

## 2017-03-05 MED ORDER — SODIUM CHLORIDE 0.9% FLUSH: 3.0000 mL | INTRAVENOUS | Status: DC | PRN

## 2017-03-05 MED ORDER — ASPIRIN EC 81 MG PO TBEC
81.0000 mg | DELAYED_RELEASE_TABLET | Freq: Every day | ORAL | Status: DC
  Administered 2017-03-05 – 2017-03-06 (×2): 81 mg via ORAL
  Filled 2017-03-05 (×2): qty 1

## 2017-03-05 MED ORDER — HYDROXYZINE HCL 10 MG PO TABS
10.0000 mg | ORAL_TABLET | Freq: Three times a day (TID) | ORAL | Status: DC | PRN
  Administered 2017-03-08: 10 mg via ORAL
  Filled 2017-03-05: qty 1

## 2017-03-05 MED ORDER — VITAMIN D 1000 UNITS PO TABS
1000.0000 [IU] | ORAL_TABLET | Freq: Two times a day (BID) | ORAL | Status: DC
  Administered 2017-03-05 – 2017-03-13 (×17): 1000 [IU] via ORAL
  Filled 2017-03-05 (×17): qty 1

## 2017-03-05 MED ORDER — FUROSEMIDE 10 MG/ML IJ SOLN
40.0000 mg | Freq: Two times a day (BID) | INTRAMUSCULAR | Status: DC
  Administered 2017-03-06 – 2017-03-08 (×4): 40 mg via INTRAVENOUS
  Filled 2017-03-05 (×5): qty 4

## 2017-03-05 MED ORDER — SODIUM CHLORIDE 0.9 % IV SOLN
250.0000 mL | INTRAVENOUS | Status: DC | PRN
  Administered 2017-03-05: 250 mL via INTRAVENOUS

## 2017-03-05 MED ORDER — ONDANSETRON HCL 4 MG/2ML IJ SOLN: 4.0000 mg | Freq: Three times a day (TID) | INTRAMUSCULAR | Status: DC | PRN

## 2017-03-05 MED ORDER — ZOLPIDEM TARTRATE 5 MG PO TABS
5.0000 mg | ORAL_TABLET | Freq: Every evening | ORAL | Status: DC | PRN
  Administered 2017-03-10 – 2017-03-12 (×3): 5 mg via ORAL
  Filled 2017-03-05 (×4): qty 1

## 2017-03-05 MED ORDER — HYDRALAZINE HCL 20 MG/ML IJ SOLN: 5.0000 mg | INTRAMUSCULAR | Status: DC | PRN

## 2017-03-05 MED ORDER — SODIUM CHLORIDE 0.9% FLUSH
3.0000 mL | Freq: Two times a day (BID) | INTRAVENOUS | Status: DC
  Administered 2017-03-05: 3 mL via INTRAVENOUS
  Administered 2017-03-05: 22:00:00 via INTRAVENOUS
  Administered 2017-03-05 – 2017-03-09 (×8): 3 mL via INTRAVENOUS

## 2017-03-05 MED ORDER — HEPARIN (PORCINE) IN NACL 100-0.45 UNIT/ML-% IJ SOLN
1000.0000 [IU]/h | INTRAMUSCULAR | Status: DC
  Administered 2017-03-05: 1000 [IU]/h via INTRAVENOUS
  Filled 2017-03-05 (×2): qty 250

## 2017-03-05 MED ORDER — ATORVASTATIN CALCIUM 40 MG PO TABS
40.0000 mg | ORAL_TABLET | Freq: Every day | ORAL | Status: DC
  Administered 2017-03-05 – 2017-03-12 (×8): 40 mg via ORAL
  Filled 2017-03-05 (×9): qty 1

## 2017-03-05 MED ORDER — ACETAMINOPHEN 325 MG PO TABS
650.0000 mg | ORAL_TABLET | Freq: Four times a day (QID) | ORAL | Status: DC | PRN
  Administered 2017-03-07 (×2): 650 mg via ORAL
  Filled 2017-03-05 (×2): qty 2

## 2017-03-05 MED ORDER — FUROSEMIDE 10 MG/ML IJ SOLN: 40.0000 mg | INTRAMUSCULAR | Status: DC

## 2017-03-05 MED ORDER — FUROSEMIDE 10 MG/ML IJ SOLN
40.0000 mg | Freq: Two times a day (BID) | INTRAMUSCULAR | Status: DC
  Administered 2017-03-05 (×3): 40 mg via INTRAVENOUS
  Filled 2017-03-05 (×3): qty 4

## 2017-03-05 MED ORDER — CARVEDILOL 12.5 MG PO TABS
12.5000 mg | ORAL_TABLET | Freq: Two times a day (BID) | ORAL | Status: DC
  Administered 2017-03-06 – 2017-03-08 (×5): 12.5 mg via ORAL
  Filled 2017-03-05 (×9): qty 1

## 2017-03-05 MED ORDER — NITROGLYCERIN 0.4 MG SL SUBL: 0.4000 mg | SUBLINGUAL_TABLET | SUBLINGUAL | Status: DC | PRN

## 2017-03-05 MED ORDER — AMIODARONE HCL 200 MG PO TABS
200.0000 mg | ORAL_TABLET | Freq: Every day | ORAL | Status: DC
  Administered 2017-03-05 – 2017-03-08 (×4): 200 mg via ORAL
  Filled 2017-03-05 (×6): qty 1

## 2017-03-05 MED ORDER — HEPARIN (PORCINE) IN NACL 100-0.45 UNIT/ML-% IJ SOLN
800.0000 [IU]/h | INTRAMUSCULAR | Status: DC
  Administered 2017-03-05: 800 [IU]/h via INTRAVENOUS

## 2017-03-05 MED ORDER — APIXABAN 2.5 MG PO TABS
2.5000 mg | ORAL_TABLET | Freq: Two times a day (BID) | ORAL | Status: DC
  Administered 2017-03-05 – 2017-03-06 (×2): 2.5 mg via ORAL
  Filled 2017-03-05 (×3): qty 1

## 2017-03-05 MED ORDER — MORPHINE SULFATE (PF) 4 MG/ML IV SOLN
2.0000 mg | INTRAVENOUS | Status: DC | PRN
  Administered 2017-03-07 (×2): 2 mg via INTRAVENOUS
  Filled 2017-03-05 (×2): qty 1

## 2017-03-05 NOTE — Consult Note (Addendum)
Cardiology Consultation:   Patient ID: CASHE GATT; [id number]; [birth year]   Admit date: 03/04/2017 Date of Consult: 03/05/2017  Primary Care Provider: Josetta Huddle, MD Primary Cardiologist: Gaynelle Cage Electrophysiologist:  Caryl Comes   Patient Profile:   Stephanie Yates is a 71 y.o. female with a hx of Cardiomyopathy EF 35-40% with fibrillator who is being seen today for the evaluation of shortness of breath, elevated troponin at the request of Dr. Dyann Kief.  History of Present Illness:   Stephanie Yates is a 71 year old female known well to Dr. Caryl Comes who has a defibrillator, ICD in place with ejection fraction of approximately 35-40%, atrial fibrillation on Eliquis anticoagulation, hypertension, hyperlipidemia who ran out of her medications approximately 1 week ago and developed shortness of breath that had been progressively getting worse, moderate to severe in severity. She also had some chest discomfort substernal moderate in severity intermittently through these episodes of shortness of breath. No fevers, no coughs. Chest pain at times felt pressure-like.  Her edema is worsening.  In the emergency department she was found to have a mildly elevated troponin of 0.13 and BNP that was elevated at 885. Chest x-ray showed pulmonary edema.  She has been on amiodarone in the past for frequent PVCs.  Currently no chest pain. Sitting on edge of beg, less SOB.   Past Medical History:  Diagnosis Date  . Chronic renal insufficiency, stage III (moderate)    Cr 2.0 5/15  . Chronic systolic CHF (congestive heart failure) ([place])   . History of tobacco abuse   . Hyperlipidemia   . Hypertension   . Nonischemic cardiomyopathy ([place])    EF 10-15% on 11/2012 cath  . Sinus bradycardia 12/21/2012  . Ventricular tachycardia ([place]) 12/14/2012    Past Surgical History:  Procedure Laterality Date  . IMPLANTABLE CARDIOVERTER DEFIBRILLATOR IMPLANT  12/19/2012   St. Jude dual-chamber ICD, serial number  [id number]  . IMPLANTABLE CARDIOVERTER DEFIBRILLATOR IMPLANT N/A 12/19/2012   Procedure: IMPLANTABLE CARDIOVERTER DEFIBRILLATOR IMPLANT;  Surgeon: Deboraha Sprang, MD;  Location: [facility] CATH LAB;  Service: Cardiovascular;  Laterality: N/A;  . LEFT AND RIGHT HEART CATHETERIZATION WITH CORONARY ANGIOGRAM N/A 12/17/2012   Procedure: LEFT AND RIGHT HEART CATHETERIZATION WITH CORONARY ANGIOGRAM;  Surgeon: Sherren Mocha, MD;  Location: [facility] C[facility] LAB;  Service: Cardiovascular;  Laterality: N/A;     Home Medications:  Prior to Admission medications   Medication Sig Start Date End Date Taking? Authorizing Provider  amiodarone (PACERONE) 200 MG tablet Take 1 tablet (200 mg total) by mouth daily. 06/07/16  Yes Deboraha Sprang, MD  carvedilol (COREG) 12.5 MG tablet Take 1 tablet (12.5 mg total) by mouth 2 (two) times daily. 06/30/16  Yes Deboraha Sprang, MD  Cholecalciferol (VITAMIN D-3 PO) Take 1,000 Units by mouth 2 (two) times daily.    Yes [provider]  ELIQUIS 2.5 MG TABS tablet TAKE ONE TABLET BY MOUTH TWICE DAILY 03/18/16  Yes Deboraha Sprang, MD  furosemide (LASIX) 40 MG tablet Take 1 tablet (40 mg total) by mouth daily. 03/14/16  Yes Sherren Mocha, MD  spironolactone (ALDACTONE) 25 MG tablet Take 0.5 tablets (12.5 mg total) by mouth daily. 11/15/16  Yes [name], PA-C    Inpatient Medications: Scheduled Meds: . amiodarone  200 mg Oral Daily  . aspirin EC  81 mg Oral Daily  . atorvastatin  40 mg Oral q1800  . carvedilol  12.5 mg Oral BID WC  . cholecalciferol  1,000 Units Oral BID  .  furosemide  40 mg Intravenous Q12H  . sodium chloride flush  3 mL Intravenous Q12H   Continuous Infusions: . sodium chloride 250 mL (03/05/17 0254)  . heparin     PRN Meds: sodium chloride, acetaminophen, hydrALAZINE, hydrOXYzine, morphine, nitroGLYCERIN, sodium chloride flush, zolpidem  Allergies:    Allergies  Allergen Reactions  . Strawberry Extract Hives, Itching and Other (See Comments)     Hives and itching     Social History:   Social History   Social History  . Marital status: Legally Separated    Spouse name: N/A  . Number of children: N/A  . Years of education: N/A   Occupational History  . Not on file.   Social History Main Topics  . Smoking status: Former Research scientist (life sciences)  . Smokeless tobacco: Never Used  . Alcohol use No  . Drug use: No  . Sexual activity: Not on file   Other Topics Concern  . Not on file   Social History Narrative  . No narrative on file    Family History:    Family History  Problem Relation Age of Onset  . Cancer Mother      ROS:  Please see the history of present illness. Positive for decreased appetite ROS  No fevers, no chills, no bleeding All other ROS reviewed and negative.     Physical Exam/Data:   Vitals:   03/05/17 0145 03/05/17 0222 03/05/17 0935 03/05/17 1210  BP: (!) 141/65 (!) 150/88 (!) 127/59 (!) 134/52  Pulse: (!) 58 65 (!) 53 (!) 54  Resp: 15   18  Temp:  98 F (36.7 C)  (!) 97.4 F (36.3 C)  TempSrc:  Oral  Oral  SpO2: 95% 96%  95%  Weight:  153 lb 14.4 oz (69.8 kg)    Height:  5\' 6"  (1.676 m)      Intake/Output Summary (Last 24 hours) at 03/05/17 1214 Last data filed at 03/05/17 0951  Gross per 24 hour  Intake            62.17 ml  Output                0 ml  Net            62.17 ml   Filed Weights   03/04/17 2024 03/05/17 0222  Weight: 140 lb (63.5 kg) 153 lb 14.4 oz (69.8 kg)   Body mass index is 24.84 kg/m.  General:  Well nourished, well developed, in no acute distress HEENT: normal Lymph: no adenopathy Neck: mid neck JVD Endocrine:  No thryomegaly Vascular: No carotid bruits  Cardiac:  normal S1, S2; RRR; no murmur  Lungs:  clear to auscultation bilaterally, no wheezing, mild basilar crackles.   Abd: soft, nontender, no hepatomegaly  Ext: 2+ BLE edema Musculoskeletal:  No deformities, BUE and BLE strength normal and equal Skin: warm and dry, shiny LE  Neuro:  CNs 2-12 intact, no  focal abnormalities noted Psych:  Normal affect   EKG:  The EKG was personally reviewed and demonstrates:  Sinus rhythm/sinus bradycardia rate 56, bifid P wave, left bundle branch block like morphology   Telemetry:  Telemetry was personally reviewed and demonstrates:  No adverse arrhythmias  Relevant CV Studies:  Echocardiogram 04/19/13  - Left ventricle: The cavity size was normal. Wall thickness was increased in a pattern of mild LVH. Systolic function was moderately reduced. The estimated ejection fraction was in the range of 35% to 40%. Possible mild hypokinesis of the inferior  myocardium. Doppler parameters are consistent with abnormal left ventricular relaxation (grade 1 diastolic dysfunction). - Mitral valve: Calcified annulus. Mildly thickened leaflets  Laboratory Data:  Chemistry Recent Labs Lab 03/04/17 2040  NA 130*  K 3.9  CL 101  CO2 21*  GLUCOSE 84  BUN 23*  CREATININE 1.76*  CALCIUM 8.2*  GFRNONAA 28*  GFRAA 32*  ANIONGAP 8    No results for input(s): PROT, ALBUMIN, AST, ALT, ALKPHOS, BILITOT in the last 168 hours. Hematology Recent Labs Lab 03/04/17 2040 03/05/17 0649  WBC 6.2 6.2  RBC 3.86* 3.49*  HGB 9.6* 8.7*  HCT 29.3* 26.2*  MCV 75.9* 75.1*  MCH 24.9* 24.9*  MCHC 32.8 33.2  RDW 22.1* 21.9*  PLT 217 203   Cardiac Enzymes Recent Labs Lab 03/04/17 2040 03/05/17 0301 03/05/17 0649  TROPONINI 0.13* 0.13* 0.12*   No results for input(s): TROPIPOC in the last 168 hours.  BNP Recent Labs Lab 03/04/17 2040  BNP 885.7*    DDimer No results for input(s): DDIMER in the last 168 hours.  Radiology/Studies:  Dg Chest 2 View  Result Date: 03/04/2017 CLINICAL DATA:  Chest pain EXAM: CHEST  2 VIEW COMPARISON:  09/30/2013 FINDINGS: Left-sided duo lead pacing device as before. Small pleural effusions. Cardiomegaly with central vascular congestion and diffuse interstitial opacity consistent with mild edema. Aortic atherosclerosis. No  pneumothorax. IMPRESSION: Cardiomegaly with tiny pleural effusions, vascular congestion and mild diffuse pulmonary edema Electronically Signed   By: Donavan Foil M.D.   On: 03/04/2017 21:30    Assessment and Plan:   Acute on chronic systolic heart failure  - Likely exacerbated by lack of cardiac medications over the past week.  - Agree with Lasix 40 mg twice a day IV, no output recorded as of yet. If output is accurate, minimal, increase.  - Echocardiogram pending  - Dietary modifications  - Back on home dose of carvedilol, 12.5 mg twice a day, AMIO.   Chest pain  - Troponin mildly elevated, flat at 0.13, 0.12. This is likely demand ischemia in the setting of acute systolic heart failure.  - Previous catheterization revealed nonischemic cardiomyopathy  - No further ischemic testing warranted.  - Chest pressure is likely also a result of acute heart failure exacerbation.  - Echocardiogram pending  Hypertensive heart disease with heart failure  - Carvedilol restarted, IV Lasix  Atrial fibrillation  - Score is 5.  - I'm comfortable with her being switched back to Eliquis. No further testing such as cardiac catheterization required.  Anemia  - Previously as low as hemoglobin of 6 in May 2018. Currently 9.6-8.7. Per primary team    For questions or updates, please contact [place] Please consult [email or web address] for contact info under Cardiology/STEMI. Daytime calls, contact the Day Call APP (6a-8a) or assigned team (Teams A-D) provider (7:30a - 5p). All other daytime calls (7:30-5p), contact the Card Master @ [phone].   Nighttime calls, contact the assigned APP (5p-8p) or MD (6:30p-8p). Overnight calls (8p-6a), contact the on call Fellow @ [phone].   Signed, Candee Furbish, MD  03/05/2017 12:14 PM

## 2017-03-05 NOTE — Progress Notes (Signed)
[place] for heparin Indication: Afib and NSTEMI  Allergies  Allergen Reactions  . Strawberry Extract Hives, Itching and Other (See Comments)    Hives and itching     Patient Measurements: Height: 5\' 6"  (167.6 cm) Weight: 153 lb 14.4 oz (69.8 kg) (b scale) IBW/kg (Calculated) : 59.3 HDW: 69.8 kg  Vital Signs: Temp: 98 F (36.7 C) (09/09 0222) Temp Source: Oral (09/09 0222) BP: 127/59 (09/09 0935) Pulse Rate: 53 (09/09 0935)  Labs:  Recent Labs  03/04/17 2040 03/05/17 0301 03/05/17 0649 03/05/17 0941  HGB 9.6*  --  8.7*  --   HCT 29.3*  --  26.2*  --   PLT 217  --  203  --   APTT  --   --   --  149*  HEPARINUNFRC  --   --   --  1.36*  CREATININE 1.76*  --   --   --   TROPONINI 0.13* 0.13* 0.12*  --     Estimated Creatinine Clearance: 26.7 mL/min (A) (by C-G formula based on SCr of 1.76 mg/dL (H)).   Medical History: Past Medical History:  Diagnosis Date  . Chronic renal insufficiency, stage III (moderate)    Cr 2.0 5/15  . Chronic systolic CHF (congestive heart failure) ([place])   . History of tobacco abuse   . Hyperlipidemia   . Hypertension   . Nonischemic cardiomyopathy ([place])    EF 10-15% on 11/2012 cath  . Sinus bradycardia 12/21/2012  . Ventricular tachycardia ([place]) 12/14/2012    Assessment: 71yo female c/o mid-chest soreness w/ some mild SOB, troponin mildly elevated, to transition from Eliquis for Afib to heparin; of note pt ran out of several meds this week, last dose of Eliquis taken 9/7.  Initial APTT high at 149 this AM, heparin level also elevated at 1.36. Labs drawn appropriately from opposite arm. Hg down 8.7, plt wnl. No bleed or IV line issues per RN.  Will hold heparin x 1 hr and resume at lower rate - discussed plan with RN.  Goal of Therapy:  Heparin level 0.3-0.7 units/ml aPTT 66-102 seconds Monitor platelets by anticoagulation protocol: Yes   Plan:  Hold heparin x 1 hr Resume heparin at lower  rate 800 units/hr at 1230 8h heparin level Daily heparin level/CBC Monitor for s/sx bleeding F/u Cardiology plans - apixaban on hold  Elicia Lamp, PharmD, BCPS Clinical Pharmacist Rx Phone # for today: [phone] After 3:30PM, please call Main Rx: #[id number] 03/05/2017 11:32 AM

## 2017-03-05 NOTE — Progress Notes (Signed)
Pt is alert and oriented with vitals stable, walked in the hall 80 ft, Iv diurese and started on eliquis for A-fib.

## 2017-03-05 NOTE — ED Notes (Signed)
Lolita Patella [phone] please call with updates or questions

## 2017-03-05 NOTE — Progress Notes (Signed)
ANTICOAGULATION CONSULT NOTE - Initial Consult  Pharmacy Consult for heparin Indication: Afib and NSTEMI  Allergies  Allergen Reactions  . Strawberry Extract Hives, Itching and Other (See Comments)    Hives and itching     Patient Measurements: Height: 5\' 6"  (167.6 cm) Weight: 140 lb (63.5 kg) IBW/kg (Calculated) : 59.3  Vital Signs: Temp: 98.1 F (36.7 C) (09/08 2018) Temp Source: Oral (09/08 2018) BP: 149/73 (09/09 0115) Pulse Rate: 60 (09/09 0115)  Labs:  Recent Labs  03/04/17 2040  HGB 9.6*  HCT 29.3*  PLT 217  CREATININE 1.76*  TROPONINI 0.13*    Estimated Creatinine Clearance: 26.7 mL/min (A) (by C-G formula based on SCr of 1.76 mg/dL (H)).   Medical History: Past Medical History:  Diagnosis Date  . Chronic renal insufficiency, stage III (moderate)    Cr 2.0 5/15  . Chronic systolic CHF (congestive heart failure) ([place])   . History of tobacco abuse   . Hyperlipidemia   . Hypertension   . Nonischemic cardiomyopathy ([place])    EF 10-15% on 11/2012 cath  . Sinus bradycardia 12/21/2012  . Ventricular tachycardia ([place]) 12/14/2012    Assessment: 71yo female c/o mid-chest soreness w/ some mild SOB, troponin mildly elevated, to transition from Eliquis for Afib to heparin; of note pt ran out of several meds this week, last dose of Eliquis taken 9/7.  Goal of Therapy:  Heparin level 0.3-0.7 units/ml aPTT 66-102 seconds Monitor platelets by anticoagulation protocol: Yes   Plan:  Will begin heparin gtt at 1000 units/hr and monitor heparin levels, aPTT (while Eliquis affects anti-Xa), and CBC.  Wynona Neat, PharmD, BCPS  03/05/2017,1:50 AM

## 2017-03-05 NOTE — H&P (Signed)
History and Physical    Stephanie Yates HEN:[id number] DOB: 1946/05/20 DOA: 03/04/2017  Referring MD/NP/PA:   PCP: Josetta Huddle, MD   Patient coming from:  The patient is coming from home.  At baseline, pt is independent for most of ADL.   Chief Complaint: SOB and chest pain  HPI: Stephanie Yates is a 71 y.o. female with medical history significant of CHF with EF of 35-40 percent, hypertension, hyperlipidemia, sinus bradycardia, former smoker, ICD placement, atrial fibrillation on Eliquis, CKD-3, who presents with shortness of breath and chest pain.  Patient states that she run out of her heart medications for more than a week . She developed SOB in the past several days, which has been progressively getting worse. She speak in full sentence. She has no cough, fever or chills. She also reports chest pain, which is located in the substernal area, intermittently, 4 out of 10 in safety, pressure-like, nonradiating. She denies nausea, vomiting, diarrhea, abdominal pain, symptoms of UTI or unilateral weakness. She also has worsening bilateral leg edema.  ED Course: pt was found to have troponin 0.13, BNP 885.7, slightly worsening renal function, temperature normal, bradycardia, oxygen saturation 97% on room air, chest x-ray showed mild pulmonary edema. Patient is admitted to telemetry bed as inpatient.  Review of Systems:   General: no fevers, chills, no body weight gain, has poor appetite, has fatigue HEENT: no blurry vision, hearing changes or sore throat Respiratory: has dyspnea, no coughing, wheezing CV: has chest pain, no palpitations GI: no nausea, vomiting, abdominal pain, diarrhea, constipation GU: no dysuria, burning on urination, increased urinary frequency, hematuria  Ext: has leg edema Neuro: no unilateral weakness, numbness, or tingling, no vision change or hearing loss Skin: no rash, no skin tear. MSK: No muscle spasm, no deformity, no limitation of range of movement in  spin Heme: No easy bruising.  Travel history: No recent long distant travel.  Allergy:  Allergies  Allergen Reactions  . Strawberry Extract Hives, Itching and Other (See Comments)    Hives and itching     Past Medical History:  Diagnosis Date  . Chronic renal insufficiency, stage III (moderate)    Cr 2.0 5/15  . Chronic systolic CHF (congestive heart failure) ([place])   . History of tobacco abuse   . Hyperlipidemia   . Hypertension   . Nonischemic cardiomyopathy ([place])    EF 10-15% on 11/2012 cath  . Sinus bradycardia 12/21/2012  . Ventricular tachycardia ([place]) 12/14/2012    Past Surgical History:  Procedure Laterality Date  . IMPLANTABLE CARDIOVERTER DEFIBRILLATOR IMPLANT  12/19/2012   St. Jude dual-chamber ICD, serial number [id number]  . IMPLANTABLE CARDIOVERTER DEFIBRILLATOR IMPLANT N/A 12/19/2012   Procedure: IMPLANTABLE CARDIOVERTER DEFIBRILLATOR IMPLANT;  Surgeon: Deboraha Sprang, MD;  Location: [facility] CATH LAB;  Service: Cardiovascular;  Laterality: N/A;  . LEFT AND RIGHT HEART CATHETERIZATION WITH CORONARY ANGIOGRAM N/A 12/17/2012   Procedure: LEFT AND RIGHT HEART CATHETERIZATION WITH CORONARY ANGIOGRAM;  Surgeon: Sherren Mocha, MD;  Location: [facility] CATH LAB;  Service: Cardiovascular;  Laterality: N/A;    Social History:  reports that she has quit smoking. She has never used smokeless tobacco. She reports that she does not drink alcohol or use drugs.  Family History:  Family History  Problem Relation Age of Onset  . Cancer Mother      Prior to Admission medications   Medication Sig Start Date End Date Taking? Authorizing Provider  amiodarone (PACERONE) 200 MG tablet Take 1 tablet (200 mg total) by  mouth daily. 06/07/16  Yes Deboraha Sprang, MD  carvedilol (COREG) 12.5 MG tablet Take 1 tablet (12.5 mg total) by mouth 2 (two) times daily. 06/30/16  Yes Deboraha Sprang, MD  Cholecalciferol (VITAMIN D-3 PO) Take 1,000 Units by mouth 2 (two) times daily.    Yes [provider]   ELIQUIS 2.5 MG TABS tablet TAKE ONE TABLET BY MOUTH TWICE DAILY 03/18/16  Yes Deboraha Sprang, MD  furosemide (LASIX) 40 MG tablet Take 1 tablet (40 mg total) by mouth daily. 03/14/16  Yes Sherren Mocha, MD  spironolactone (ALDACTONE) 25 MG tablet Take 0.5 tablets (12.5 mg total) by mouth daily. 11/15/16  Yes [name], PA-C    Physical Exam: Vitals:   03/05/17 0115 03/05/17 0130 03/05/17 0145 03/05/17 0222  BP: (!) 149/73 (!) 143/62 (!) 141/65 (!) 150/88  Pulse: 60 60 (!) 58 65  Resp: 18 13 15    Temp:    98 F (36.7 C)  TempSrc:    Oral  SpO2: 96% 96% 95% 96%  Weight:    69.8 kg (153 lb 14.4 oz)  Height:    5\' 6"  (1.676 m)   General: Not in acute distress HEENT:       Eyes: PERRL, EOMI, no scleral icterus.       ENT: No discharge from the ears and nose, no pharynx injection, no tonsillar enlargement.        Neck: + JVD, no bruit, no mass felt. Heme: No neck lymph node enlargement. Cardiac: S1/S2, RRR, No murmurs, No gallops or rubs. Respiratory: No rales, wheezing, rhonchi or rubs. GI: Soft, nondistended, nontender, no rebound pain, no organomegaly, BS present. GU: No hematuria Ext: 2+ pitting leg edema bilaterally. 2+DP/PT pulse bilaterally. Musculoskeletal: No joint deformities, No joint redness or warmth, no limitation of ROM in spin. Skin: No rashes.  Neuro: Alert, oriented X3, cranial nerves II-XII grossly intact, moves all extremities normally. Psych: Patient is not psychotic, no suicidal or hemocidal ideation.  Labs on Admission: I have personally reviewed following labs and imaging studies  CBC:  Recent Labs Lab 03/04/17 2040  WBC 6.2  HGB 9.6*  HCT 29.3*  MCV 75.9*  PLT 166   Basic Metabolic Panel:  Recent Labs Lab 03/04/17 2040  NA 130*  K 3.9  CL 101  CO2 21*  GLUCOSE 84  BUN 23*  CREATININE 1.76*  CALCIUM 8.2*   GFR: Estimated Creatinine Clearance: 26.7 mL/min (A) (by C-G formula based on SCr of 1.76 mg/dL (H)). Liver Function  Tests: No results for input(s): AST, ALT, ALKPHOS, BILITOT, PROT, ALBUMIN in the last 168 hours. No results for input(s): LIPASE, AMYLASE in the last 168 hours. No results for input(s): AMMONIA in the last 168 hours. Coagulation Profile: No results for input(s): INR, PROTIME in the last 168 hours. Cardiac Enzymes:  Recent Labs Lab 03/04/17 2040 03/05/17 0301  TROPONINI 0.13* 0.13*   BNP (last 3 results) No results for input(s): PROBNP in the last 8760 hours. HbA1C: No results for input(s): HGBA1C in the last 72 hours. CBG: No results for input(s): GLUCAP in the last 168 hours. Lipid Profile:  Recent Labs  03/05/17 0301  CHOL 132  HDL 16*  LDLCALC 101*  TRIG 74  CHOLHDL 8.3   Thyroid Function Tests: No results for input(s): TSH, T4TOTAL, FREET4, T3FREE, THYROIDAB in the last 72 hours. Anemia Panel: No results for input(s): VITAMINB12, FOLATE, FERRITIN, TIBC, IRON, RETICCTPCT in the last 72 hours. Urine analysis:  Component Value Date/Time   COLORURINE YELLOW 08/29/2014 2035   APPEARANCEUR CLOUDY (A) 08/29/2014 2035   LABSPEC 1.017 08/29/2014 2035   PHURINE 6.0 08/29/2014 2035   GLUCOSEU NEGATIVE 08/29/2014 2035   HGBUR SMALL (A) 08/29/2014 2035   BILIRUBINUR NEGATIVE 08/29/2014 2035   KETONESUR NEGATIVE 08/29/2014 2035   PROTEINUR NEGATIVE 08/29/2014 2035   UROBILINOGEN 1.0 08/29/2014 2035   NITRITE NEGATIVE 08/29/2014 2035   LEUKOCYTESUR TRACE (A) 08/29/2014 2035   Sepsis Labs: @LABRCNTIP (procalcitonin:4,lacticidven:4) )No results found for this or any previous visit (from the past 240 hour(s)).   Radiological Exams on Admission: Dg Chest 2 View  Result Date: 03/04/2017 CLINICAL DATA:  Chest pain EXAM: CHEST  2 VIEW COMPARISON:  09/30/2013 FINDINGS: Left-sided duo lead pacing device as before. Small pleural effusions. Cardiomegaly with central vascular congestion and diffuse interstitial opacity consistent with mild edema. Aortic atherosclerosis. No  pneumothorax. IMPRESSION: Cardiomegaly with tiny pleural effusions, vascular congestion and mild diffuse pulmonary edema Electronically Signed   By: Donavan Foil M.D.   On: 03/04/2017 21:30     EKG: Independently reviewed.  Sinus rhythm, left axis deviation, left atrial enlargement, low voltage, poor R-wave progression, anteroseptal infarction pattern, QTC 511.  Assessment/Plan Principal Problem:   Acute on chronic combined systolic and diastolic CHF (congestive heart failure) (HCC) Active Problems:   Hypertension   Atrial fibrillation ([place])   ICD (implantable cardioverter-defibrillator), dual, st Judes   HLD (hyperlipidemia)   CKD (chronic kidney disease), stage III   Chest pain   NSTEMI (non-ST elevated myocardial infarction) (HCC)   Microcytic anemia   Acute on chronic combined systolic and diastolic CHF (congestive heart failure) [facility]): Patient has 2+ leg edema, elevated BNP 885, and the pulmonary edema by chest x-ray, consistent with CHF exacerbation. 2-D echo on 04/19/13 showed EF 35-40 percent with grade 1 diastolic dysfunction. -will admit to tele bed as inpt. -Lasix 40 mg bid by IV -trop x 3 -2d echo -will continue home coreg, ASA -Daily weights -strict I/O's -Low salt diet  Chest pain and NSTEMI: Troponin 0.13. Likely due to demand ischemia secondary to CHF exacerbation, but patient has active chest pain, non-STEMI is also possible. - cycle CE q6 x3 and repeat EKG in the am  - Nitroglycerin, Morphine, and aspirin, coreg - Risk factor stratification: will check FLP and A1C  - 2d echo - Inpatient non-urgent consult order was put in Epic and message to Birdie Sons was sent out.  HTN: -Continue Coreg -On IV Lasix -IV hydralazine when necessary -switch Eliquis to IV heparin  Atrial Fibrillation: CHA2DS2-VASc Score is 5, needs oral anticoagulation. Patient is on Eliquis at home. Heart rate is well controlled. -continue Coreg and amiodarone -Switch Eliquis to IV  heparin as above  HLD (hyperlipidemia): not on meds at home Will add lipitor due to NSTEMI -f/u FLP  CKD (chronic kidney disease), stage III: Stable. Baseline creatinine 1.658/24/60 -Follow up renal function by BMP    Microcytic anemia: Hemoglobin 6.0 on 11/15/16-->9.6 today. -Anemia panel   DVT ppx: on IV Heparin  Code Status: Full code Family Communication: None at bed side.        Disposition Plan:  Anticipate discharge back to previous home environment Consults called: none  Admission status:   Inpatient/tele    Date of Service 03/05/2017    Ivor Costa [facility] Pager [phone]  If 7PM-7AM, please contact night-coverage [email or web address] Password [email or web address] 03/05/2017, 6:10 AM

## 2017-03-05 NOTE — Progress Notes (Signed)
Patient seen and examined. Admitted after midnight secondary to increase shortness of breath and lower extremity swelling. Patient also expressing some chest discomfort. Currently denying palpitations and extremity exam stable. Has been admitted secondary to acute on chronic CHF exacerbation and was found to have mild troponin elevation in the setting of demand ischemia. After discussing with cardiology services is no plan for further ischemic workup at this moment. Will discontinue heparin and resume patient's eliquis (anticoagulation drugs for prevention therapy in a patient with atrial fibrillation). Continue beta blockers and continue IV Lasix. Will apply TED hoses. Follow clinical response. Please refer to H&P written by Dr.Niu on 03/05/17 for further info/details on patient's admission.  Barton Dubois MD [phone]

## 2017-03-05 NOTE — Progress Notes (Signed)
Clinical Social Worker received a consult stating "Financial issues with obtaining medication". CSW relayed consult to [facility]. CSW signing off as patient doe snot have social work needs. Please consult social work again if new needs arise.   Rhea Pink, MSW,  [place]

## 2017-03-06 ENCOUNTER — Inpatient Hospital Stay (HOSPITAL_COMMUNITY): Payer: Medicare Other

## 2017-03-06 DIAGNOSIS — E785 Hyperlipidemia, unspecified: Secondary | ICD-10-CM

## 2017-03-06 DIAGNOSIS — I340 Nonrheumatic mitral (valve) insufficiency: Secondary | ICD-10-CM

## 2017-03-06 DIAGNOSIS — D638 Anemia in other chronic diseases classified elsewhere: Secondary | ICD-10-CM

## 2017-03-06 DIAGNOSIS — Z95810 Presence of automatic (implantable) cardiac defibrillator: Secondary | ICD-10-CM

## 2017-03-06 DIAGNOSIS — R748 Abnormal levels of other serum enzymes: Secondary | ICD-10-CM

## 2017-03-06 DIAGNOSIS — I480 Paroxysmal atrial fibrillation: Secondary | ICD-10-CM

## 2017-03-06 DIAGNOSIS — N183 Chronic kidney disease, stage 3 (moderate): Secondary | ICD-10-CM

## 2017-03-06 LAB — ECHOCARDIOGRAM COMPLETE
HEIGHTINCHES: 66 in
WEIGHTICAEL: 2403.2 [oz_av]

## 2017-03-06 LAB — IRON AND TIBC
Iron: 21 ug/dL — ABNORMAL LOW (ref 28–170)
Saturation Ratios: 5 % — ABNORMAL LOW (ref 10.4–31.8)
TIBC: 384 ug/dL (ref 250–450)
UIBC: 363 ug/dL

## 2017-03-06 LAB — RETICULOCYTES
RBC.: 3.67 MIL/uL — AB (ref 3.87–5.11)
RETIC COUNT ABSOLUTE: 51.4 10*3/uL (ref 19.0–186.0)
RETIC CT PCT: 1.4 % (ref 0.4–3.1)

## 2017-03-06 LAB — HEPATIC FUNCTION PANEL
ALBUMIN: 2.5 g/dL — AB (ref 3.5–5.0)
ALK PHOS: 75 U/L (ref 38–126)
ALT: 27 U/L (ref 14–54)
AST: 69 U/L — ABNORMAL HIGH (ref 15–41)
BILIRUBIN INDIRECT: 0.8 mg/dL (ref 0.3–0.9)
Bilirubin, Direct: 0.4 mg/dL (ref 0.1–0.5)
TOTAL PROTEIN: 6.8 g/dL (ref 6.5–8.1)
Total Bilirubin: 1.2 mg/dL (ref 0.3–1.2)

## 2017-03-06 LAB — CBC
HCT: 27.1 % — ABNORMAL LOW (ref 36.0–46.0)
HEMOGLOBIN: 9.2 g/dL — AB (ref 12.0–15.0)
MCH: 25.3 pg — ABNORMAL LOW (ref 26.0–34.0)
MCHC: 33.9 g/dL (ref 30.0–36.0)
MCV: 74.7 fL — ABNORMAL LOW (ref 78.0–100.0)
PLATELETS: 229 10*3/uL (ref 150–400)
RBC: 3.63 MIL/uL — ABNORMAL LOW (ref 3.87–5.11)
RDW: 22.4 % — ABNORMAL HIGH (ref 11.5–15.5)
WBC: 6.1 10*3/uL (ref 4.0–10.5)

## 2017-03-06 LAB — BASIC METABOLIC PANEL
Anion gap: 5 (ref 5–15)
BUN: 23 mg/dL — ABNORMAL HIGH (ref 6–20)
CALCIUM: 8.2 mg/dL — AB (ref 8.9–10.3)
CO2: 25 mmol/L (ref 22–32)
CREATININE: 1.69 mg/dL — AB (ref 0.44–1.00)
Chloride: 107 mmol/L (ref 101–111)
GFR, EST AFRICAN AMERICAN: 34 mL/min — AB (ref >60)
GFR, EST NON AFRICAN AMERICAN: 29 mL/min — AB (ref >60)
GLUCOSE: 86 mg/dL (ref 65–99)
Potassium: 3.6 mmol/L (ref 3.5–5.1)
Sodium: 137 mmol/L (ref 135–145)

## 2017-03-06 LAB — FOLATE: FOLATE: 11.7 ng/mL (ref >5.9)

## 2017-03-06 LAB — VITAMIN B12: VITAMIN B 12: 499 pg/mL (ref 180–914)

## 2017-03-06 LAB — FERRITIN: Ferritin: 30 ng/mL (ref 11–307)

## 2017-03-06 MED ORDER — LIVING BETTER WITH HEART FAILURE BOOK
Freq: Once | Status: AC
  Administered 2017-03-06: 1

## 2017-03-06 MED ORDER — APIXABAN 2.5 MG PO TABS
2.5000 mg | ORAL_TABLET | Freq: Once | ORAL | Status: AC
  Administered 2017-03-06: 2.5 mg via ORAL
  Filled 2017-03-06: qty 1

## 2017-03-06 MED ORDER — POTASSIUM CHLORIDE CRYS ER 20 MEQ PO TBCR
20.0000 meq | EXTENDED_RELEASE_TABLET | Freq: Once | ORAL | Status: AC
  Administered 2017-03-06: 20 meq via ORAL
  Filled 2017-03-06: qty 1

## 2017-03-06 MED ORDER — APIXABAN 5 MG PO TABS
5.0000 mg | ORAL_TABLET | Freq: Two times a day (BID) | ORAL | Status: DC
  Administered 2017-03-06 – 2017-03-10 (×8): 5 mg via ORAL
  Filled 2017-03-06 (×8): qty 1

## 2017-03-06 NOTE — Discharge Instructions (Addendum)
Information on my medicine - ELIQUIS (apixaban)  Why was Eliquis prescribed for you? Eliquis was prescribed for you to reduce the risk of a blood clot forming that can cause a stroke if you have a medical condition called atrial fibrillation (a type of irregular heartbeat).  What do You need to know about Eliquis ? Take your Eliquis TWICE DAILY - one tablet in the morning and one tablet in the evening with or without food. If you have difficulty swallowing the tablet whole please discuss with your pharmacist how to take the medication safely.  Take Eliquis exactly as prescribed by your doctor and DO NOT stop taking Eliquis without talking to the doctor who prescribed the medication.  Stopping may increase your risk of developing a stroke.  Refill your prescription before you run out.  After discharge, you should have regular check-up appointments with your healthcare provider that is prescribing your Eliquis.  In the future your dose may need to be changed if your kidney function or weight changes by a significant amount or as you get older.  What do you do if you miss a dose? If you miss a dose, take it as soon as you remember on the same day and resume taking twice daily.  Do not take more than one dose of ELIQUIS at the same time to make up a missed dose.  Important Safety Information A possible side effect of Eliquis is bleeding. You should call your healthcare provider right away if you experience any of the following: ? Bleeding from an injury or your nose that does not stop. ? Unusual colored urine (red or dark brown) or unusual colored stools (red or black). ? Unusual bruising for unknown reasons. ? A serious fall or if you hit your head (even if there is no bleeding).  Some medicines may interact with Eliquis and might increase your risk of bleeding or clotting while on Eliquis. To help avoid this, consult your healthcare provider or pharmacist prior to using any new  prescription or non-prescription medications, including herbals, vitamins, non-steroidal anti-inflammatory drugs (NSAIDs) and supplements.  This website has more information on Eliquis (apixaban): http://[email or web address].com/eliquis/home  Additional discharge instructions  Please get your medications reviewed and adjusted by your Primary MD.  Please request your Primary MD to go over all Hospital Tests and Procedure/Radiological results at the follow up, please get all Hospital records sent to your Prim MD by signing hospital release before you go home.  If you had Pneumonia of Lung problems at the Hospital: Please get a 2 view Chest X ray done in 6-8 weeks after hospital discharge or sooner if instructed by your Primary MD.  If you have Congestive Heart Failure: Please call your Cardiologist or Primary MD anytime you have any of the following symptoms:  1) 3 pound weight gain in 24 hours or 5 pounds in 1 week  2) shortness of breath, with or without a dry hacking cough  3) swelling in the hands, feet or stomach  4) if you have to sleep on extra pillows at night in order to breathe  Follow cardiac low salt diet and 1.5 lit/day fluid restriction.  If you have diabetes Accuchecks 4 times/day, Once in AM empty stomach and then before each meal. Log in all results and show them to your primary doctor at your next visit. If any glucose reading is under 80 or above 300 call your primary MD immediately.  If you have Seizure/Convulsions/Epilepsy: Please do not drive, operate heavy   machinery, participate in activities at heights or participate in high speed sports until you have seen by Primary MD or a Neurologist and advised to do so again.  If you had Gastrointestinal Bleeding: Please ask your Primary MD to check a complete blood count within one week of discharge or at your next visit. Your endoscopic/colonoscopic biopsies that are pending at the time of discharge, will also need to followed by  your Primary MD.  Get Medicines reviewed and adjusted. Please take all your medications with you for your next visit with your Primary MD  Please request your Primary MD to go over all hospital tests and procedure/radiological results at the follow up, please ask your Primary MD to get all Hospital records sent to his/her office.  If you experience worsening of your admission symptoms, develop shortness of breath, life threatening emergency, suicidal or homicidal thoughts you must seek medical attention immediately by calling 911 or calling your MD immediately  if symptoms less severe.  You must read complete instructions/literature along with all the possible adverse reactions/side effects for all the Medicines you take and that have been prescribed to you. Take any new Medicines after you have completely understood and accpet all the possible adverse reactions/side effects.   Do not drive or operate heavy machinery when taking Pain medications.   Do not take more than prescribed Pain, Sleep and Anxiety Medications  Special Instructions: If you have smoked or chewed Tobacco  in the last 2 yrs please stop smoking, stop any regular Alcohol  and or any Recreational drug use.  Wear Seat belts while driving.  Please note You were cared for by a hospitalist during your hospital stay. If you have any questions about your discharge medications or the care you received while you were in the hospital after you are discharged, you can call the unit and asked to speak with the hospitalist on call if the hospitalist that took care of you is not available. Once you are discharged, your primary care physician will handle any further medical issues. Please note that NO REFILLS for any discharge medications will be authorized once you are discharged, as it is imperative that you return to your primary care physician (or establish a relationship with a primary care physician if you do not have one) for your  aftercare needs so that they can reassess your need for medications and monitor your lab values.  You can reach the hospitalist office at phone [phone] or fax [phone]   If you do not have a primary care physician, you can call [phone] for a physician referral.    

## 2017-03-06 NOTE — Clinical Social Work Note (Signed)
CSW acknowledges consult, "Financial issues with obtaining medications." Will notify RNCM in morning progression meeting.  CSW signing off. Consult again if any social work needs arise.  Dayton Scrape, [place]

## 2017-03-06 NOTE — Progress Notes (Signed)
[facility] PROGRESS NOTE  Stephanie Yates GLO:[id number] DOB: 1946/04/11 DOA: 03/04/2017 PCP: Josetta Huddle, MD  Interim summary and HPI 71 y.o. female with medical history significant of CHF with EF of 35-40 percent, hypertension, hyperlipidemia, sinus bradycardia, former smoker, ICD placement, atrial fibrillation on Eliquis, CKD-3, who presents with shortness of breath and chest pain.  Assessment/Plan: 1-acute on chronic systolic HF -due to diet indiscretion mainly -patient echo demonstrating EF of 20-25% -no CP today -troponin flat elevation in setting of demand ischemia -will continue IV lasix, daily weights, strict I's O's and low sodium diet -education provided about low sodium diet and nutritional service involved for further teaching. -still SOB with exertion and with signs of fluid overload.. -continue TED hoses  2-chronic A. Fib -rate controlled -CHADSVASC score 4-5 -continue metoprolol for rate control -continue eliquis  3-hx of PVC's/VT -will follow electrolytes closely -goal is for K above 4 and Mg around 2 -continue amiodarone as per cardiology rec's  4-CKD stage 3 -essentially at baseline -will follow renal function trend, especially with ongoing diuresis   5-anemia of chronic disease -Hgb stable for her baseline -no signs of acute bleeding -will follow trend   6-HLD Will continue statins    Code Status: Full Family Communication: no family at bedside today. Disposition Plan:    Consultants:  Cardiology   Procedures:  2-D echo - Left ventricle: LVEF is approximately 20 to 25% with akinesis of   the mid/distal inferior, mid/distal inferoseptal, distal lateral   and apical walls ; hypokinesis of the base/mid inferior, the   base/mid lateral and distal anterior walls. The cavity size was   normal. Wall thickness was increased in a pattern of mild LVH.   Doppler parameters are consistent with abnormal left ventricular   relaxation (grade 1  diastolic dysfunction). Doppler parameters   are consistent with high ventricular filling pressure. - Mitral valve: There was mild regurgitation. - Pulmonary arteries: PA peak pressure: 44 mm Hg (S).  Antibiotics:  None   HPI/Subjective: No CP, still SOB with activity, but with improvement in her breathing overall. No using accessory muscles.  Objective: Vitals:   03/06/17 1200 03/06/17 1707  BP: 114/64 (!) 122/51  Pulse: (!) 50 (!) 50  Resp: 16   Temp: 97.8 F (36.6 C)   SpO2: 100%     Intake/Output Summary (Last 24 hours) at 03/06/17 1900 Last data filed at 03/06/17 1500  Gross per 24 hour  Intake             1540 ml  Output             3000 ml  Net            -1460 ml   Filed Weights   03/04/17 2024 03/05/17 0222 03/06/17 0424  Weight: 63.5 kg (140 lb) 69.8 kg (153 lb 14.4 oz) 68.1 kg (150 lb 3.2 oz)    Exam:   General:  Afebrile, no nausea, no vomiting, no CP. Reports she is breathing better, even still SOB with activities.   Cardiovascular: S1 and S2, no rubs, no gallops, no murmurs   Respiratory: fine crackles bibasilar, no wheezing, normal resp effort.  Abdomen: soft, NT, ND, positive BS  Musculoskeletal: 2+ bilaterally, no wheezing, no crackles   Data Reviewed: Basic Metabolic Panel:  Recent Labs Lab 03/04/17 2040 03/06/17 0344  NA 130* 137  K 3.9 3.6  CL 101 107  CO2 21* 25  GLUCOSE 84 86  BUN 23* 23*  CREATININE  1.76* 1.69*  CALCIUM 8.2* 8.2*   Liver Function Tests:  Recent Labs Lab 03/06/17 1000  AST 69*  ALT 27  ALKPHOS 75  BILITOT 1.2  PROT 6.8  ALBUMIN 2.5*   CBC:  Recent Labs Lab 03/04/17 2040 03/05/17 0649 03/06/17 0344  WBC 6.2 6.2 6.1  HGB 9.6* 8.7* 9.2*  HCT 29.3* 26.2* 27.1*  MCV 75.9* 75.1* 74.7*  PLT 217 203 229   Cardiac Enzymes:  Recent Labs Lab 03/04/17 2040 03/05/17 0301 03/05/17 0649 03/05/17 1320  TROPONINI 0.13* 0.13* 0.12* 0.12*   BNP (last 3 results)  Recent Labs  03/16/16 1457  03/04/17 2040  BNP 367.0* 885.7*    Studies: Dg Chest 2 View  Result Date: 03/04/2017 CLINICAL DATA:  Chest pain EXAM: CHEST  2 VIEW COMPARISON:  09/30/2013 FINDINGS: Left-sided duo lead pacing device as before. Small pleural effusions. Cardiomegaly with central vascular congestion and diffuse interstitial opacity consistent with mild edema. Aortic atherosclerosis. No pneumothorax. IMPRESSION: Cardiomegaly with tiny pleural effusions, vascular congestion and mild diffuse pulmonary edema Electronically Signed   By: Donavan Foil M.D.   On: 03/04/2017 21:30    Scheduled Meds: . amiodarone  200 mg Oral Daily  . apixaban  5 mg Oral BID  . atorvastatin  40 mg Oral q1800  . carvedilol  12.5 mg Oral BID WC  . cholecalciferol  1,000 Units Oral BID  . furosemide  40 mg Intravenous Q12H  . sodium chloride flush  3 mL Intravenous Q12H   Continuous Infusions: . sodium chloride 250 mL (03/05/17 0254)    Principal Problem:   Acute on chronic combined systolic and diastolic CHF (congestive heart failure) (HCC) Active Problems:   Hypertension   Elevated troponin   PAF (paroxysmal atrial fibrillation) ([place])   ICD (implantable cardioverter-defibrillator), dual, [employer]   HLD (hyperlipidemia)   CKD (chronic kidney disease), stage III   Chest pain   Microcytic anemia    Time spent: 35 minutes   Barton Dubois  [facility] Pager [phone]. If 7PM-7AM, please contact night-coverage at [email or web address], password [facility] 03/06/2017, 7:00 PM  LOS: 1 day

## 2017-03-06 NOTE — Progress Notes (Signed)
Echocardiogram 2D Echocardiogram has been performed.  Stephanie Yates 03/06/2017, 11:40 AM

## 2017-03-06 NOTE — Progress Notes (Signed)
Nutrition Education Note  RD consulted for nutrition education regarding CHF.  RD provided "Heart Healthy and Low Sodium Nutrition Therapy" handout from the Academy of [employer] and Dietetics. Reviewed patient's dietary recall. Provided examples on ways to decrease sodium intake in diet. Discouraged intake of processed foods and use of salt shaker. Encouraged fresh fruits and vegetables as well as whole grain sources of carbohydrates to maximize fiber intake.   RD discussed why it is important for patient to adhere to diet recommendations, and emphasized the role of fluids, foods to avoid, and importance of weighing self daily. Teach back method used.  Expect fair compliance.  Body mass index is 24.24 kg/m. Pt meets criteria for normal weight based on current BMI. Pt with many weight fluctuations prior to admit but reports that her weight is stable.   Current diet order is heart healthy, patient is consuming approximately 100% of meals at this time. Labs and medications reviewed. No further nutrition interventions warranted at this time. RD contact information provided. If additional nutrition issues arise, please re-consult RD.   Koleen Distance MS, RD, LDN Pager #- [phone] After Hours Pager: [phone]

## 2017-03-06 NOTE — Care Management Note (Signed)
Case Management Note  Patient Details  Name: Stephanie Yates MRN: [id number] Date of Birth: 07-25-1945  Subjective/Objective:  CHF                 Action/Plan: Patient lives at home alone; Primary Care Provider: Josetta Huddle, MD; has private insurance with [facility] with prescription drug coverage; pharmacy of choice is [facility]; patient works every day at [employer] from 7 am - 2 pm; she remains active, no DME; patient stated that she ran out of her medication; CM informed patient to contact her Cardiologist to make him aware( maybe have samples in the office); Supportive person - grandson Gerald Stabs, he takes her to her apts; she eats a low sodium diet and cooks daily; No needs identified; CM will continue to follow for DCPAneta Mins [phone]  Expected Discharge Date:    possibly 03/09/2017              Expected Discharge Plan:  Home/Self Care  Discharge planning Services  CM Consult  Status of Service:  In process, will continue to follow  Sherrilyn Rist [phone] 03/06/2017, 10:05 AM

## 2017-03-06 NOTE — Progress Notes (Signed)
Progress Note  Patient Name: Stephanie Yates Date of Encounter: 03/06/2017  Primary Cardiologist: Dr. Caryl Comes  Subjective    Feeling better but still with DOE. No CP. Reports LEE comes and goes. Has been eating fried chicken.  Inpatient Medications    Scheduled Meds: . amiodarone  200 mg Oral Daily  . apixaban  2.5 mg Oral BID  . aspirin EC  81 mg Oral Daily  . atorvastatin  40 mg Oral q1800  . carvedilol  12.5 mg Oral BID WC  . cholecalciferol  1,000 Units Oral BID  . furosemide  40 mg Intravenous Q12H  . sodium chloride flush  3 mL Intravenous Q12H   Continuous Infusions: . sodium chloride 250 mL (03/05/17 0254)   PRN Meds: sodium chloride, acetaminophen, hydrALAZINE, hydrOXYzine, morphine, nitroGLYCERIN, sodium chloride flush, zolpidem   Vital Signs    Vitals:   03/06/17 0011 03/06/17 0424 03/06/17 0810 03/06/17 0923  BP: (!) 147/52 (!) 133/37 (!) 148/67 (!) 105/48  Pulse: 62 (!) 57 63 (!) 51  Resp: 18 18    Temp: 97.9 F (36.6 C) 99.1 F (37.3 C)  97.6 F (36.4 C)  TempSrc: Oral Oral  Oral  SpO2: 96% 97%  (!) 88%  Weight:  150 lb 3.2 oz (68.1 kg)    Height:        Intake/Output Summary (Last 24 hours) at 03/06/17 1006 Last data filed at 03/06/17 9381  Gross per 24 hour  Intake             1890 ml  Output             2500 ml  Net             -610 ml   Filed Weights   03/04/17 2024 03/05/17 0222 03/06/17 0424  Weight: 140 lb (63.5 kg) 153 lb 14.4 oz (69.8 kg) 150 lb 3.2 oz (68.1 kg)    Telemetry    NSR rare pacing occ PVCs - Personally Reviewed  Physical Exam   GEN: No acute distress.  HEENT: Normocephalic, atraumatic, sclera non-icteric. Neck: No JVD or bruits. Cardiac: RRR no murmurs, rubs, or gallops.  Radials/DP/PT 1+ and equal bilaterally.  Respiratory: Bilateral crackles at bases, otherwise no wheezes. Breathing is unlabored. GI: Soft, nontender, non-distended, BS +x 4. MS: no deformity. Extremities: No clubbing or cyanosis. No edema.  Distal pedal pulses are 2+ and equal bilaterally. Neuro:  AAOx3. Follows commands. Psych:  Responds to questions appropriately with a normal affect.  Labs    Chemistry Recent Labs Lab 03/04/17 2040 03/06/17 0344  NA 130* 137  K 3.9 3.6  CL 101 107  CO2 21* 25  GLUCOSE 84 86  BUN 23* 23*  CREATININE 1.76* 1.69*  CALCIUM 8.2* 8.2*  GFRNONAA 28* 29*  GFRAA 32* 34*  ANIONGAP 8 5     Hematology Recent Labs Lab 03/04/17 2040 03/05/17 0649 03/06/17 0344  WBC 6.2 6.2 6.1  RBC 3.86* 3.49* 3.63*  3.67*  HGB 9.6* 8.7* 9.2*  HCT 29.3* 26.2* 27.1*  MCV 75.9* 75.1* 74.7*  MCH 24.9* 24.9* 25.3*  MCHC 32.8 33.2 33.9  RDW 22.1* 21.9* 22.4*  PLT 217 203 229    Cardiac Enzymes Recent Labs Lab 03/04/17 2040 03/05/17 0301 03/05/17 0649 03/05/17 1320  TROPONINI 0.13* 0.13* 0.12* 0.12*   No results for input(s): TROPIPOC in the last 168 hours.   BNP Recent Labs Lab 03/04/17 2040  BNP 885.7*     DDimer No results  for input(s): DDIMER in the last 168 hours.   Radiology    Dg Chest 2 View  Result Date: 03/04/2017 CLINICAL DATA:  Chest pain EXAM: CHEST  2 VIEW COMPARISON:  09/30/2013 FINDINGS: Left-sided duo lead pacing device as before. Small pleural effusions. Cardiomegaly with central vascular congestion and diffuse interstitial opacity consistent with mild edema. Aortic atherosclerosis. No pneumothorax. IMPRESSION: Cardiomegaly with tiny pleural effusions, vascular congestion and mild diffuse pulmonary edema Electronically Signed   By: Donavan Foil M.D.   On: 03/04/2017 21:30    Cardiac Studies   2D echo 03/2013 Study Conclusions  - Left ventricle: The cavity size was normal. Wall thickness was increased in a pattern of mild LVH. Systolic function was moderately reduced. The estimated ejection fraction was in the range of 35% to 40%. Possible mild hypokinesis of the inferior myocardium. Doppler parameters are consistent with abnormal left ventricular  relaxation (grade 1 diastolic dysfunction). - Mitral valve: Calcified annulus. Mildly thickened leaflets . - Left atrium: The atrium was mildly dilated    Patient Profile     71 y.o. female with chronic systolic CHF/NICM s/p St. Jude ICD (dx 2014 when she presented with ventricular tachycardia, EF 10-15% in 2014 with nonobstructive CAD, improved to 35-40% in 03/2013), VT/PVCs (placed on amiodarone in 2014), paroxysmal atrial fib, sinus bradycardia, tobacco abuse, CKD III, HTN, HLD, prior abnormal LFTs, anemia presented to [facility] with progressive SOB, chest pressure, edema in setting of running out of medicines for 1 week. Scale broken at home.  Assessment & Plan    1. Acute on chronic combined CHF in setting of med noncompliance - still has DOE and crackles on exam. Would continue IV Lasix through today, reassess tomorrow. Dry weight unclear - was 156 on 5/22, 139 on 8/10. Continue resumption of home medication otherwise. BP running softer on last check but otherwise normotensive. Suspect dietary noncompliance contributing as well. Rx CHF book and ask dietician to see. Hold off resumption of spironolactone pending f/u of OP. In the past she's been intermittently noncompliant with this. Updated echo pending.  2. H/o PVCs/VT - resumption of amiodarone this admission. No VT on telemetry. K 3.6 - give 2meq KCl. Update LFTs and thyroid.  3. Paroxysmal atrial fib - maintaining NSR. Not clear why she has been on lower dose apixaban as OP. Dr. Olin Pia OV from 04/2014 states "We'll continue her on apixaban. At 2.5 mg  Bid given renal insufficiency and wt 73 kg." At most recent OV with Tommye Standard PA-C, she tried to call patient after visit to discuss increasing to 5mg  BID but were unable to connect. Per review with Dr. Meda Coffee, will increase.  4. CKD stage III - creatinine stable.  5. Chest pressure/elevated troponin - suspect demand ischemia in setting of #1. Dr. Marlou Porch did not feel  further ischemic w/u was necessary. Prior nonobstructive CAD by cath in 2014. LDL 101 in setting of noncompliance, will need f/u as OP. She was not on aspirin as OP - will discontinue.  Signed, Charlie Pitter, PA-C  03/06/2017, 10:06 AM      The patient was seen, examined and discussed with Melina Copa, PA-C and I agree with the above.   The patient remains fluid overloaded, still significantly SOB on exertion, Crea mildly improved 1.76->1.67, I would continue the same dose of lasix 40 mg iv BID. Baseline weight not known. We will increase Eliquis to 5 mg PO BID.   Ena Dawley, MD 03/06/2017

## 2017-03-07 DIAGNOSIS — R0789 Other chest pain: Secondary | ICD-10-CM

## 2017-03-07 DIAGNOSIS — M10261 Drug-induced gout, right knee: Secondary | ICD-10-CM

## 2017-03-07 LAB — BASIC METABOLIC PANEL
Anion gap: 6 (ref 5–15)
BUN: 23 mg/dL — ABNORMAL HIGH (ref 6–20)
CO2: 25 mmol/L (ref 22–32)
CREATININE: 1.63 mg/dL — AB (ref 0.44–1.00)
Calcium: 8.6 mg/dL — ABNORMAL LOW (ref 8.9–10.3)
Chloride: 105 mmol/L (ref 101–111)
GFR, EST AFRICAN AMERICAN: 35 mL/min — AB (ref >60)
GFR, EST NON AFRICAN AMERICAN: 30 mL/min — AB (ref >60)
Glucose, Bld: 111 mg/dL — ABNORMAL HIGH (ref 65–99)
Potassium: 3.9 mmol/L (ref 3.5–5.1)
SODIUM: 136 mmol/L (ref 135–145)

## 2017-03-07 LAB — CBC
HCT: 27.7 % — ABNORMAL LOW (ref 36.0–46.0)
Hemoglobin: 9 g/dL — ABNORMAL LOW (ref 12.0–15.0)
MCH: 24.5 pg — ABNORMAL LOW (ref 26.0–34.0)
MCHC: 32.5 g/dL (ref 30.0–36.0)
MCV: 75.3 fL — ABNORMAL LOW (ref 78.0–100.0)
PLATELETS: 217 10*3/uL (ref 150–400)
RBC: 3.68 MIL/uL — AB (ref 3.87–5.11)
RDW: 21.7 % — ABNORMAL HIGH (ref 11.5–15.5)
WBC: 5.9 10*3/uL (ref 4.0–10.5)

## 2017-03-07 LAB — TSH: TSH: 1.868 u[IU]/mL (ref 0.350–4.500)

## 2017-03-07 MED ORDER — HYDROCODONE-ACETAMINOPHEN 5-325 MG PO TABS
2.0000 | ORAL_TABLET | Freq: Once | ORAL | Status: AC
  Administered 2017-03-08: 2 via ORAL
  Filled 2017-03-07: qty 2

## 2017-03-07 MED ORDER — HYDRALAZINE HCL 25 MG PO TABS
25.0000 mg | ORAL_TABLET | Freq: Three times a day (TID) | ORAL | Status: DC
  Administered 2017-03-07 – 2017-03-09 (×4): 25 mg via ORAL
  Filled 2017-03-07 (×6): qty 1

## 2017-03-07 MED ORDER — PREDNISONE 20 MG PO TABS
30.0000 mg | ORAL_TABLET | Freq: Every day | ORAL | Status: AC
  Administered 2017-03-07 – 2017-03-11 (×5): 30 mg via ORAL
  Filled 2017-03-07 (×5): qty 1

## 2017-03-07 NOTE — Progress Notes (Signed)
Progress Note  Patient Name: Stephanie Yates Date of Encounter: 03/07/2017  Primary Cardiologist: Dr. Caryl Comes  Subjective    Feeling slightly better today, she complains of swollen painful knee.  Inpatient Medications    Scheduled Meds: . amiodarone  200 mg Oral Daily  . apixaban  5 mg Oral BID  . atorvastatin  40 mg Oral q1800  . carvedilol  12.5 mg Oral BID WC  . cholecalciferol  1,000 Units Oral BID  . furosemide  40 mg Intravenous Q12H  . sodium chloride flush  3 mL Intravenous Q12H   Continuous Infusions: . sodium chloride 250 mL (03/05/17 0254)   PRN Meds: sodium chloride, acetaminophen, hydrALAZINE, hydrOXYzine, morphine, nitroGLYCERIN, sodium chloride flush, zolpidem   Vital Signs    Vitals:   03/06/17 1944 03/07/17 0039 03/07/17 0422 03/07/17 0822  BP: (!) 135/49 (!) 136/59 (!) 155/52 (!) 141/99  Pulse: (!) 52 (!) 55 (!) 53 60  Resp: 18 18 18    Temp: 98.5 F (36.9 C) 98.9 F (37.2 C) 98.6 F (37 C)   TempSrc: Oral Oral Oral   SpO2: 100% 96% 97% 95%  Weight:   144 lb 6.4 oz (65.5 kg)   Height:        Intake/Output Summary (Last 24 hours) at 03/07/17 0845 Last data filed at 03/07/17 0840  Gross per 24 hour  Intake              723 ml  Output             3000 ml  Net            -2277 ml   Filed Weights   03/05/17 0222 03/06/17 0424 03/07/17 0422  Weight: 153 lb 14.4 oz (69.8 kg) 150 lb 3.2 oz (68.1 kg) 144 lb 6.4 oz (65.5 kg)    Telemetry    NSR rare pacing occ PVCs - Personally Reviewed  Physical Exam   GEN: No acute distress.  HEENT: Normocephalic, atraumatic, sclera non-icteric. Neck: No JVD or bruits. Cardiac: RRR no murmurs, rubs, or gallops.  Radials/DP/PT 1+ and equal bilaterally.  Respiratory: Bilateral crackles at bases, otherwise no wheezes. Breathing is unlabored. GI: Soft, nontender, non-distended, BS +x 4. MS: no deformity. Extremities: No clubbing or cyanosis. Right knee swelling and increased warmth. Distal pedal pulses are  2+ and equal bilaterally. Neuro:  AAOx3. Follows commands. Psych:  Responds to questions appropriately with a normal affect.  Labs    Chemistry  Recent Labs Lab 03/04/17 2040 03/06/17 0344 03/06/17 1000 03/07/17 0649  NA 130* 137  --  136  K 3.9 3.6  --  3.9  CL 101 107  --  105  CO2 21* 25  --  25  GLUCOSE 84 86  --  111*  BUN 23* 23*  --  23*  CREATININE 1.76* 1.69*  --  1.63*  CALCIUM 8.2* 8.2*  --  8.6*  PROT  --   --  6.8  --   ALBUMIN  --   --  2.5*  --   AST  --   --  69*  --   ALT  --   --  27  --   ALKPHOS  --   --  75  --   BILITOT  --   --  1.2  --   GFRNONAA 28* 29*  --  30*  GFRAA 32* 34*  --  35*  ANIONGAP 8 5  --  6  Hematology  Recent Labs Lab 03/05/17 0649 03/06/17 0344 03/07/17 0649  WBC 6.2 6.1 5.9  RBC 3.49* 3.63*  3.67* 3.68*  HGB 8.7* 9.2* 9.0*  HCT 26.2* 27.1* 27.7*  MCV 75.1* 74.7* 75.3*  MCH 24.9* 25.3* 24.5*  MCHC 33.2 33.9 32.5  RDW 21.9* 22.4* 21.7*  PLT 203 229 217   Cardiac Enzymes  Recent Labs Lab 03/04/17 2040 03/05/17 0301 03/05/17 0649 03/05/17 1320  TROPONINI 0.13* 0.13* 0.12* 0.12*   No results for input(s): TROPIPOC in the last 168 hours.   BNP  Recent Labs Lab 03/04/17 2040  BNP 885.7*    DDimer No results for input(s): DDIMER in the last 168 hours.   Radiology    No results found.  Cardiac Studies   2D echo 03/2013 Study Conclusions  - Left ventricle: The cavity size was normal. Wall thickness was increased in a pattern of mild LVH. Systolic function was moderately reduced. The estimated ejection fraction was in the range of 35% to 40%. Possible mild hypokinesis of the inferior myocardium. Doppler parameters are consistent with abnormal left ventricular relaxation (grade 1 diastolic dysfunction). - Mitral valve: Calcified annulus. Mildly thickened leaflets . - Left atrium: The atrium was mildly dilated  03/06/2017 - Left ventricle: LVEF is approximately 20 to 25% with  akinesis of   the mid/distal inferior, mid/distal inferoseptal, distal lateral   and apical walls ; hypokinesis of the base/mid inferior, the   base/mid lateral and distal anterior walls. The cavity size was   normal. Wall thickness was increased in a pattern of mild LVH.   Doppler parameters are consistent with abnormal left ventricular   relaxation (grade 1 diastolic dysfunction). Doppler parameters   are consistent with high ventricular filling pressure. - Mitral valve: There was mild regurgitation. - Pulmonary arteries: PA peak pressure: 44 mm Hg (S).   Patient Profile     71 y.o. female with chronic systolic CHF/NICM s/p St. Jude ICD (dx 2014 when she presented with ventricular tachycardia, EF 10-15% in 2014 with nonobstructive CAD, improved to 35-40% in 03/2013), VT/PVCs (placed on amiodarone in 2014), paroxysmal atrial fib, sinus bradycardia, tobacco abuse, CKD III, HTN, HLD, prior abnormal LFTs, anemia presented to [facility] with progressive SOB, chest pressure, edema in setting of running out of medicines for 1 week. Scale broken at home.  Assessment & Plan    1. Acute on chronic combined CHF in setting of med noncompliance - still has DOE and crackles on exam, negative 2.3 L overnight, I would continue 1 more day of iv diuresis and tentatively plan for a discharge in the am. Discharge on Lasix 40 mg PO BID, previously 40 mg po daily. She is now at her dry weight.  LVEF 20-25%, previously 35-40%. She is hypertensive, I will start hydralazine 25 mg PO TID. Crea mildly improved 1.76->1.63.   2. H/o PVCs/VT - resumption of amiodarone this admission. No VT on telemetry. K 3.9. LFTs and thyroid both normal.  3. Paroxysmal atrial fib - maintaining NSR. Not clear why she has been on lower dose apixaban as OP. Dr. Olin Pia OV from 04/2014 states "We'll continue her on apixaban. At 2.5 mg  Bid given renal insufficiency and wt 73 kg." At most recent OV with Tommye Standard PA-C, she  tried to call patient after visit to discuss increasing to 5mg  BID but were unable to connect. Per review with Dr. Meda Coffee, will increase.  4. CKD stage III - creatinine stable.  5. Chest pressure/elevated troponin -  suspect demand ischemia in setting of #1. Dr. Marlou Porch did not feel further ischemic w/u was necessary. Prior nonobstructive CAD by cath in 2014. LDL 101 in setting of noncompliance, will need f/u as OP. She was not on aspirin as OP - will discontinue.  Ena Dawley, MD 03/07/2017

## 2017-03-07 NOTE — Progress Notes (Signed)
[facility] PROGRESS NOTE  Stephanie Yates GDJ:[id number] DOB: 1945-07-09 DOA: 03/04/2017 PCP: Josetta Huddle, MD  Interim summary and HPI 71 y.o. female with medical history significant of CHF with EF of 35-40 percent, hypertension, hyperlipidemia, sinus bradycardia, former smoker, ICD placement, atrial fibrillation on Eliquis, CKD-3, who presents with shortness of breath and chest pain.  Assessment/Plan: 1-acute on chronic systolic HF -due to diet indiscretion most likely  -patient echo demonstrating EF of 20-25% -no CP and reporting no palpitations -troponin flat elevation in setting of demand ischemia -education provided about low sodium diet and nutritional service involved for further teaching. -still SOB with exertion; even significantly improved; also improvement in her leg swelling. No JVD and just fine crackles on exam -will continue IV lasix for another 24 hours, daily weights, strict I's O's and low sodium diet -continue TED hoses.  2-chronic A. Fib -rate controlled -CHADSVASC score 4-5 -continue metoprolol for rate control -continue eliquis; dose adjusted to 5mg  BID  3-hx of PVC's/VT -will follow electrolytes closely -goal is for K above 4 and Mg around 2 -continue amiodarone as per cardiology rec's  4-CKD stage 3 -stable and has remained essentially at baseline -will follow renal function trend, especially with ongoing diuresis   5-anemia of chronic disease -Hgb stable for her baseline -no signs of acute bleeding -will follow Hgb trend   6-HLD -Will continue statins  -heart healthy ordered  9-acute gout right knee -most likely precipitated with IV lasix -will treat with prednisone in setting of acute diuresis and CKD 3  -no fever, no WBC's   Code Status: Full Family Communication: no family at bedside today. Disposition Plan: continue IV lasix for another 24 hours as recommended by cardiology; will use prednisone for acute gout; follow clinical  response. Most likely home in the next 24-48 hours.   Consultants:  Cardiology   Procedures:  2-D echo - Left ventricle: LVEF is approximately 20 to 25% with akinesis of   the mid/distal inferior, mid/distal inferoseptal, distal lateral   and apical walls ; hypokinesis of the base/mid inferior, the   base/mid lateral and distal anterior walls. The cavity size was   normal. Wall thickness was increased in a pattern of mild LVH.   Doppler parameters are consistent with abnormal left ventricular   relaxation (grade 1 diastolic dysfunction). Doppler parameters   are consistent with high ventricular filling pressure. - Mitral valve: There was mild regurgitation. - Pulmonary arteries: PA peak pressure: 44 mm Hg (S).  Antibiotics:  None   HPI/Subjective: No CP, no nausea, no vomiting, no abd pain. Afebrile. Right knee swollen and painful to touch.   Objective: Vitals:   03/07/17 1029 03/07/17 1207  BP: (!) 139/55 (!) 143/51  Pulse: (!) 55 (!) 58  Resp:    Temp:    SpO2:  92%    Intake/Output Summary (Last 24 hours) at 03/07/17 1736 Last data filed at 03/07/17 1406  Gross per 24 hour  Intake              843 ml  Output             3400 ml  Net            -2557 ml   Filed Weights   03/05/17 0222 03/06/17 0424 03/07/17 0422  Weight: 69.8 kg (153 lb 14.4 oz) 68.1 kg (150 lb 3.2 oz) 65.5 kg (144 lb 6.4 oz)    Exam:   General:  Afebrile, no nausea, no vomiting, no  CP. Patient with right knee swelling and pain. Patient reported significant improvement in breathing and is less SOB with exertion now. Comfortable Laying almost flat on bed during assessment.  Cardiovascular: S1 and S2, no rubs, no gallops, no murmur. No JVD appreciated on exam.  Respiratory: very fine crackles at bases bilaterally, no wheezing, no rhonchi, normal resp effort.   Abdomen: soft, NT, ND, positive BS  Musculoskeletal: trace edema bilaterally, no cyanosis, no clubbing; TED hoses in place. Right  knee swollen, warm and tender on palpation.   Data Reviewed: Basic Metabolic Panel:  Recent Labs Lab 03/04/17 2040 03/06/17 0344 03/07/17 0649  NA 130* 137 136  K 3.9 3.6 3.9  CL 101 107 105  CO2 21* 25 25  GLUCOSE 84 86 111*  BUN 23* 23* 23*  CREATININE 1.76* 1.69* 1.63*  CALCIUM 8.2* 8.2* 8.6*   Liver Function Tests:  Recent Labs Lab 03/06/17 1000  AST 69*  ALT 27  ALKPHOS 75  BILITOT 1.2  PROT 6.8  ALBUMIN 2.5*   CBC:  Recent Labs Lab 03/04/17 2040 03/05/17 0649 03/06/17 0344 03/07/17 0649  WBC 6.2 6.2 6.1 5.9  HGB 9.6* 8.7* 9.2* 9.0*  HCT 29.3* 26.2* 27.1* 27.7*  MCV 75.9* 75.1* 74.7* 75.3*  PLT 217 203 229 217   Cardiac Enzymes:  Recent Labs Lab 03/04/17 2040 03/05/17 0301 03/05/17 0649 03/05/17 1320  TROPONINI 0.13* 0.13* 0.12* 0.12*   BNP (last 3 results)  Recent Labs  03/16/16 1457 03/04/17 2040  BNP 367.0* 885.7*    Studies: No results found.  Scheduled Meds: . amiodarone  200 mg Oral Daily  . apixaban  5 mg Oral BID  . atorvastatin  40 mg Oral q1800  . carvedilol  12.5 mg Oral BID WC  . cholecalciferol  1,000 Units Oral BID  . furosemide  40 mg Intravenous Q12H  . hydrALAZINE  25 mg Oral Q8H  . predniSONE  30 mg Oral Q breakfast  . sodium chloride flush  3 mL Intravenous Q12H   Continuous Infusions: . sodium chloride Stopped (03/05/17 1600)    Principal Problem:   Acute on chronic combined systolic and diastolic CHF (congestive heart failure) (HCC) Active Problems:   Hypertension   Elevated troponin   PAF (paroxysmal atrial fibrillation) ([place])   ICD (implantable cardioverter-defibrillator), dual, [employer]   HLD (hyperlipidemia)   CKD (chronic kidney disease), stage III   Chest pain   Microcytic anemia    Time spent: 30 minutes   Barton Dubois  [facility] Pager [phone]. If 7PM-7AM, please contact night-coverage at [email or web address], password [facility] 03/07/2017, 5:36 PM  LOS: 2 days

## 2017-03-07 NOTE — Progress Notes (Signed)
Patient did not complain of any chest pain throughout the night, did complain of 10/10 right knee pain.

## 2017-03-07 NOTE — Progress Notes (Signed)
Patient complaining of right knee pain, 9/10. NP paged for different pain medicine.

## 2017-03-07 NOTE — Progress Notes (Signed)
HS lasix and hydralazine held due to low BP.

## 2017-03-07 NOTE — Progress Notes (Signed)
Patients BP low, did manual BP 96/32. NP notified.

## 2017-03-08 ENCOUNTER — Inpatient Hospital Stay (HOSPITAL_COMMUNITY): Payer: Medicare Other

## 2017-03-08 DIAGNOSIS — M1711 Unilateral primary osteoarthritis, right knee: Secondary | ICD-10-CM

## 2017-03-08 DIAGNOSIS — I10 Essential (primary) hypertension: Secondary | ICD-10-CM

## 2017-03-08 DIAGNOSIS — D509 Iron deficiency anemia, unspecified: Secondary | ICD-10-CM

## 2017-03-08 LAB — BASIC METABOLIC PANEL
ANION GAP: 6 (ref 5–15)
BUN: 30 mg/dL — ABNORMAL HIGH (ref 6–20)
CALCIUM: 8.6 mg/dL — AB (ref 8.9–10.3)
CO2: 24 mmol/L (ref 22–32)
Chloride: 107 mmol/L (ref 101–111)
Creatinine, Ser: 1.84 mg/dL — ABNORMAL HIGH (ref 0.44–1.00)
GFR, EST AFRICAN AMERICAN: 30 mL/min — AB (ref >60)
GFR, EST NON AFRICAN AMERICAN: 26 mL/min — AB (ref >60)
Glucose, Bld: 107 mg/dL — ABNORMAL HIGH (ref 65–99)
POTASSIUM: 4.3 mmol/L (ref 3.5–5.1)
Sodium: 137 mmol/L (ref 135–145)

## 2017-03-08 LAB — URIC ACID: Uric Acid, Serum: 5.8 mg/dL (ref 2.3–6.6)

## 2017-03-08 LAB — CBC
HEMATOCRIT: 27.6 % — AB (ref 36.0–46.0)
HEMOGLOBIN: 9 g/dL — AB (ref 12.0–15.0)
MCH: 24.5 pg — ABNORMAL LOW (ref 26.0–34.0)
MCHC: 32.6 g/dL (ref 30.0–36.0)
MCV: 75 fL — AB (ref 78.0–100.0)
PLATELETS: 201 10*3/uL (ref 150–400)
RBC: 3.68 MIL/uL — AB (ref 3.87–5.11)
RDW: 21.8 % — ABNORMAL HIGH (ref 11.5–15.5)
WBC: 8.9 10*3/uL (ref 4.0–10.5)

## 2017-03-08 MED ORDER — ISOSORBIDE MONONITRATE ER 30 MG PO TB24
15.0000 mg | ORAL_TABLET | Freq: Every day | ORAL | Status: DC
  Administered 2017-03-08: 15 mg via ORAL
  Filled 2017-03-08 (×3): qty 1

## 2017-03-08 MED ORDER — FUROSEMIDE 40 MG PO TABS
40.0000 mg | ORAL_TABLET | Freq: Every day | ORAL | Status: DC
  Filled 2017-03-08: qty 1

## 2017-03-08 MED ORDER — SPIRONOLACTONE 25 MG PO TABS
12.5000 mg | ORAL_TABLET | Freq: Every day | ORAL | Status: DC
  Administered 2017-03-08: 12.5 mg via ORAL
  Filled 2017-03-08: qty 1

## 2017-03-08 MED ORDER — HYDRALAZINE HCL 50 MG PO TABS: 50.0000 mg | ORAL_TABLET | Freq: Three times a day (TID) | ORAL | Status: DC

## 2017-03-08 MED ORDER — FUROSEMIDE 10 MG/ML IJ SOLN
40.0000 mg | Freq: Every day | INTRAMUSCULAR | Status: DC
  Administered 2017-03-08: 40 mg via INTRAVENOUS
  Filled 2017-03-08: qty 4

## 2017-03-08 NOTE — Progress Notes (Signed)
[facility] PROGRESS NOTE  Stephanie Yates AST:[id number] DOB: Jan 27, 1946 DOA: 03/04/2017 PCP: Josetta Huddle, MD  Interim summary and HPI 71 y.o. female with medical history significant of CHF with EF of 35-40 percent, hypertension, hyperlipidemia, sinus bradycardia, former smoker, ICD placement, atrial fibrillation on Eliquis, CKD-3, who presents with shortness of breath and chest pain.   Subjective.  Patient in bed, appears comfortable, denies any headache, no fever, no chest pain or pressure, no shortness of breath , no abdominal pain. No focal weakness.   Assessment/Plan:  1-Acute on chronic systolic heart failure with EF of 20-25% on echocardiogram. Due to noncompliance with diet, improving with IV Lasix which will be continued, continue TED stockings,provided with diet educatin, troponin trend flat and a non-ACS pattern suggesting demand ischemia from CHF decompensation. Continue combination of Coreg, hydralazine, Lasix, Aldactone, Imdur, no ACE/ARB due to renal failure. On Amio,.    2- Chronic A. Fib - [place] vasc 2 score 4. Continue metoprolol for rate control along with Eliquis.    3-hx of PVC's/VT -will follow electrolytes closely -goal is for K above 4 and Mg around 2 -continue amiodarone as per cardiology rec's  4-CKD stage 3 -stable and has remained essentially at baseline -will follow renal function trend, especially with ongoing diuresis   5-anemia of chronic disease -Hgb stable for her baseline -no signs of acute bleeding -will follow Hgb trend   6-HLD -Will continue statins  -heart healthy ordered  9- Right knee arthritis and pain. X-ray stable, uric acid stable, could be gout however needs fluid aspirated to rule out infection thereafter can use steroids liberally. Orthopedics called for the same.       Code Status: Full Family Communication: no family at bedside today. Disposition Plan: continue IV lasix for another 24 hours as recommended by  cardiology; will use prednisone for acute gout; follow clinical response. Most likely home in the next 24-48 hours. DVT - prophylaxis with Eliquis Consults. Cardiology, orthopnea  Consultants:  Cardiology   Procedures:  2-D echo   - Left ventricle: LVEF is approximately 20 to 25% with akinesis of  the mid/distal inferior, mid/distal inferoseptal, distal lateral  and apical walls ; hypokinesis of the base/mid inferior, the  base/mid lateral and distal anterior walls. The cavity size was normal. Wall thickness was increased in a pattern of mild LVH.  Doppler parameters are consistent with abnormal left ventricular relaxation (grade 1 diastolic dysfunction). Doppler parameters are consistent with high ventricular filling pressure. - Mitral valve: There was mild regurgitation. - Pulmonary arteries: PA peak pressure: 44 mm Hg (S).  Antibiotics:  None    Objective: Vitals:   03/08/17 0550 03/08/17 0819  BP: (!) 127/52 (!) 124/46  Pulse: (!) 52 (!) 53  Resp: 18 14  Temp: (!) 97.5 F (36.4 C) 97.6 F (36.4 C)  SpO2: 97% 95%    Intake/Output Summary (Last 24 hours) at 03/08/17 1245 Last data filed at 03/08/17 1051  Gross per 24 hour  Intake              895 ml  Output             1050 ml  Net             -155 ml   Filed Weights   03/06/17 0424 03/07/17 0422 03/08/17 0550  Weight: 68.1 kg (150 lb 3.2 oz) 65.5 kg (144 lb 6.4 oz) 64.7 kg (142 lb 9.6 oz)     Exam:  Awake Alert, Oriented  X 3, No new F.N deficits, Normal affect [place].AT,PERRAL Supple Neck,No JVD, No cervical lymphadenopathy appriciated.  Symmetrical Chest wall movement, Good air movement bilaterally, +ve rales RRR,No Gallops,Rubs or new Murmurs, No Parasternal Heave +ve B.Sounds, Abd Soft, No tenderness, No organomegaly appriciated, No rebound - guarding or rigidity. No Cyanosis, Clubbing or edema, No new Rash or bruise, R.Knee swollen, with Moderate effusion and some tenderness, no warmth    Data  Reviewed: Basic Metabolic Panel:  Recent Labs Lab 03/04/17 2040 03/06/17 0344 03/07/17 0649 03/08/17 0435  NA 130* 137 136 137  K 3.9 3.6 3.9 4.3  CL 101 107 105 107  CO2 21* 25 25 24   GLUCOSE 84 86 111* 107*  BUN 23* 23* 23* 30*  CREATININE 1.76* 1.69* 1.63* 1.84*  CALCIUM 8.2* 8.2* 8.6* 8.6*   Liver Function Tests:  Recent Labs Lab 03/06/17 1000  AST 69*  ALT 27  ALKPHOS 75  BILITOT 1.2  PROT 6.8  ALBUMIN 2.5*   CBC:  Recent Labs Lab 03/04/17 2040 03/05/17 0649 03/06/17 0344 03/07/17 0649 03/08/17 0435  WBC 6.2 6.2 6.1 5.9 8.9  HGB 9.6* 8.7* 9.2* 9.0* 9.0*  HCT 29.3* 26.2* 27.1* 27.7* 27.6*  MCV 75.9* 75.1* 74.7* 75.3* 75.0*  PLT 217 203 229 217 201   Cardiac Enzymes:  Recent Labs Lab 03/04/17 2040 03/05/17 0301 03/05/17 0649 03/05/17 1320  TROPONINI 0.13* 0.13* 0.12* 0.12*   BNP (last 3 results)  Recent Labs  03/16/16 1457 03/04/17 2040  BNP 367.0* 885.7*    Studies: Dg Knee Complete 4 Views Right  Result Date: 03/08/2017 CLINICAL DATA:  No inj,had chronic pain rt knee for yrs ,but today started having pain and swelling all over rt knee EXAM: RIGHT KNEE - COMPLETE 4+ VIEW COMPARISON:  CT 07/12/2016 FINDINGS: No evidence of fracture, dislocation. Small suprapatellar bursa effusion. Marginal spurring and Subchondral irregularity involving patella and anterior femoral condyles. Extensive femoral-popliteal arterial calcifications. IMPRESSION: 1. Negative for fracture or other acute findings. 2. Advanced   femoral-popliteal DJD with effusion. Electronically Signed   By: Lucrezia Europe M.D.   On: 03/08/2017 11:30    Scheduled Meds: . amiodarone  200 mg Oral Daily  . apixaban  5 mg Oral BID  . atorvastatin  40 mg Oral q1800  . carvedilol  12.5 mg Oral BID WC  . cholecalciferol  1,000 Units Oral BID  . [START ON 03/09/2017] furosemide  40 mg Oral Daily  . hydrALAZINE  25 mg Oral Q8H  . predniSONE  30 mg Oral Q breakfast  . sodium chloride flush  3  mL Intravenous Q12H   Continuous Infusions: . sodium chloride Stopped (03/05/17 1600)    Principal Problem:   Acute on chronic combined systolic and diastolic CHF (congestive heart failure) (HCC) Active Problems:   Hypertension   Elevated troponin   PAF (paroxysmal atrial fibrillation) ([place])   ICD (implantable cardioverter-defibrillator), dual, [employer]   HLD (hyperlipidemia)   CKD (chronic kidney disease), stage III   Chest pain   Microcytic anemia    Time spent: 30 minutes  Signature  Lala Lund M.D on 03/08/2017 at 12:46 PM  Between 7am to 7pm - Pager - [phone] ( page via [email or web address], text pages only, please mention full 10 digit call back number).  After 7pm go to [email or web address] - password [facility]

## 2017-03-08 NOTE — Progress Notes (Signed)
Progress Note  Patient Name: Stephanie Yates Date of Encounter: 03/08/2017  Primary Cardiologist: Dr. Caryl Comes  Subjective    Feeling slightly better today with breathing, complains of knee pain, unable to walk because of that.  Inpatient Medications    Scheduled Meds: . amiodarone  200 mg Oral Daily  . apixaban  5 mg Oral BID  . atorvastatin  40 mg Oral q1800  . carvedilol  12.5 mg Oral BID WC  . cholecalciferol  1,000 Units Oral BID  . furosemide  40 mg Intravenous Q12H  . hydrALAZINE  25 mg Oral Q8H  . predniSONE  30 mg Oral Q breakfast  . sodium chloride flush  3 mL Intravenous Q12H   Continuous Infusions: . sodium chloride Stopped (03/05/17 1600)   PRN Meds: sodium chloride, acetaminophen, hydrALAZINE, hydrOXYzine, morphine, nitroGLYCERIN, sodium chloride flush, zolpidem   Vital Signs    Vitals:   03/07/17 2200 03/08/17 0200 03/08/17 0550 03/08/17 0819  BP: (!) 96/32 (!) 116/51 (!) 127/52 (!) 124/46  Pulse:   (!) 52 (!) 53  Resp:   18 14  Temp:   (!) 97.5 F (36.4 C) 97.6 F (36.4 C)  TempSrc:   Oral Oral  SpO2:   97% 95%  Weight:   142 lb 9.6 oz (64.7 kg)   Height:        Intake/Output Summary (Last 24 hours) at 03/08/17 0821 Last data filed at 03/08/17 0536  Gross per 24 hour  Intake              960 ml  Output             1900 ml  Net             -940 ml   Filed Weights   03/06/17 0424 03/07/17 0422 03/08/17 0550  Weight: 150 lb 3.2 oz (68.1 kg) 144 lb 6.4 oz (65.5 kg) 142 lb 9.6 oz (64.7 kg)    Telemetry    NSR - Personally Reviewed  Physical Exam   GEN: No acute distress.  HEENT: Normocephalic, atraumatic, sclera non-icteric. Neck: No JVD or bruits. Cardiac: RRR no murmurs, rubs, or gallops.  Radials/DP/PT 1+ and equal bilaterally.  Respiratory: CTA B/L, otherwise no wheezes. Breathing is unlabored. GI: Soft, nontender, non-distended, BS +x 4. MS: no deformity. Extremities: No clubbing or cyanosis. Right knee swelling and increased  warmth. Distal pedal pulses are 2+ and equal bilaterally. Neuro:  AAOx3. Follows commands. Psych:  Responds to questions appropriately with a normal affect.  Labs    Chemistry  Recent Labs Lab 03/06/17 0344 03/06/17 1000 03/07/17 0649 03/08/17 0435  NA 137  --  136 137  K 3.6  --  3.9 4.3  CL 107  --  105 107  CO2 25  --  25 24  GLUCOSE 86  --  111* 107*  BUN 23*  --  23* 30*  CREATININE 1.69*  --  1.63* 1.84*  CALCIUM 8.2*  --  8.6* 8.6*  PROT  --  6.8  --   --   ALBUMIN  --  2.5*  --   --   AST  --  69*  --   --   ALT  --  27  --   --   ALKPHOS  --  75  --   --   BILITOT  --  1.2  --   --   GFRNONAA 29*  --  30* 26*  GFRAA 34*  --  35* 30*  ANIONGAP 5  --  6 6    Hematology  Recent Labs Lab 03/06/17 0344 03/07/17 0649 03/08/17 0435  WBC 6.1 5.9 8.9  RBC 3.63*  3.67* 3.68* 3.68*  HGB 9.2* 9.0* 9.0*  HCT 27.1* 27.7* 27.6*  MCV 74.7* 75.3* 75.0*  MCH 25.3* 24.5* 24.5*  MCHC 33.9 32.5 32.6  RDW 22.4* 21.7* 21.8*  PLT 229 217 201   Cardiac Enzymes  Recent Labs Lab 03/04/17 2040 03/05/17 0301 03/05/17 0649 03/05/17 1320  TROPONINI 0.13* 0.13* 0.12* 0.12*   No results for input(s): TROPIPOC in the last 168 hours.   BNP  Recent Labs Lab 03/04/17 2040  BNP 885.7*    DDimer No results for input(s): DDIMER in the last 168 hours.   Radiology    No results found.  Cardiac Studies   2D echo 03/2013 Study Conclusions  - Left ventricle: The cavity size was normal. Wall thickness was increased in a pattern of mild LVH. Systolic function was moderately reduced. The estimated ejection fraction was in the range of 35% to 40%. Possible mild hypokinesis of the inferior myocardium. Doppler parameters are consistent with abnormal left ventricular relaxation (grade 1 diastolic dysfunction). - Mitral valve: Calcified annulus. Mildly thickened leaflets . - Left atrium: The atrium was mildly dilated  03/06/2017 - Left ventricle: LVEF is  approximately 20 to 25% with akinesis of   the mid/distal inferior, mid/distal inferoseptal, distal lateral   and apical walls ; hypokinesis of the base/mid inferior, the   base/mid lateral and distal anterior walls. The cavity size was   normal. Wall thickness was increased in a pattern of mild LVH.   Doppler parameters are consistent with abnormal left ventricular   relaxation (grade 1 diastolic dysfunction). Doppler parameters   are consistent with high ventricular filling pressure. - Mitral valve: There was mild regurgitation. - Pulmonary arteries: PA peak pressure: 44 mm Hg (S).   Patient Profile     71 y.o. female with chronic systolic CHF/NICM s/p [employer] ICD (dx 2014 when she presented with ventricular tachycardia, EF 10-15% in 2014 with nonobstructive CAD, improved to 35-40% in 03/2013), VT/PVCs (placed on amiodarone in 2014), paroxysmal atrial fib, sinus bradycardia, tobacco abuse, CKD III, HTN, HLD, prior abnormal LFTs, anemia presented to [facility] with progressive SOB, chest pressure, edema in setting of running out of medicines for 1 week. Scale broken at home.  Assessment & Plan    1. Acute on chronic combined CHF in setting of med noncompliance - - now euvolemic, Crea up trending, I would hold off lasix this am and then start Lasix 40 mg PO BID, previously 40 mg po daily. She is now at her dry weight - 142 lbs.  LVEF 20-25%, previously 35-40%. BP now normal after adding hydralazine 25 mg PO TID. Crea 1.76->1.63-->1.84.   2. H/o PVCs/VT - resumption of amiodarone this admission. No VT on telemetry. K 4.3. LFTs and thyroid both normal.  3. Paroxysmal atrial fib - maintaining NSR. Not clear why she has been on lower dose apixaban as OP. Dr. Olin Pia OV from 04/2014 states "We'll continue her on apixaban. At 2.5 mg  Bid given renal insufficiency and wt 73 kg." At most recent OV with Tommye Standard PA-C, she tried to call patient after visit to discuss increasing to 5mg   BID but were unable to connect. Per review with Dr. Meda Coffee, will increase.  4. CKD stage III - creatinine stable.  5. Chest pressure/elevated troponin - suspect  demand ischemia in setting of #1. Dr. Marlou Porch did not feel further ischemic w/u was necessary. Prior nonobstructive CAD by cath in 2014. LDL 101 in setting of noncompliance, will need f/u as OP. She was not on aspirin as OP - will discontinue.  6. Knee pain - followed by primary team, possibly pseudogout triggered by diuretics   Ena Dawley, MD 03/08/2017

## 2017-03-08 NOTE — Progress Notes (Addendum)
Patient had a better sleep tonight, prednisone has really helped knee to fell better. Patient still has knee pain though. No other concerns or complaints.

## 2017-03-08 NOTE — Progress Notes (Signed)
Radiology staff called stating that they are unable to aspirate fluid from knee as ordered d/t pt taking eliquis.  Pt must be off eliquis for 2 days before they can do this.  Dr. Candiss Norse has been notified.

## 2017-03-08 NOTE — Consult Note (Signed)
Reason for Consult:Right knee pain Referring Physician: P Yates Stephanie is an 71 y.o. female.  HPI: Stephanie Yates was admitted to the hospital 9/8 with SOB related to CHF. She has been diuresed and is feeling much more comfortable. Yesterday her right knee began to hurt and this has continued to today. It's about the same intensity as yesterday. It is also swollen. She has a history of this knee hurting and has had it injected in the past (apparently by Dr. Stann Mainland) which helped somewhat. Denies any diagnosis of gout. Has significant OA by x-ray. Is on Eliquis. Denies any other joint problems except occasionally with the left knee which is neither as severe or as frequent.  Past Medical History:  Diagnosis Date  . Chronic renal insufficiency, stage III (moderate)    Cr 2.0 5/15  . Chronic systolic CHF (congestive heart failure) ([place])   . History of tobacco abuse   . Hyperlipidemia   . Hypertension   . Nonischemic cardiomyopathy ([place])    EF 10-15% on 11/2012 cath  . Sinus bradycardia 12/21/2012  . Ventricular tachycardia ([place]) 12/14/2012    Past Surgical History:  Procedure Laterality Date  . IMPLANTABLE CARDIOVERTER DEFIBRILLATOR IMPLANT  12/19/2012   St. Jude dual-chamber ICD, serial number [id number]  . IMPLANTABLE CARDIOVERTER DEFIBRILLATOR IMPLANT N/A 12/19/2012   Procedure: IMPLANTABLE CARDIOVERTER DEFIBRILLATOR IMPLANT;  Surgeon: Deboraha Sprang, MD;  Location: [facility] CATH LAB;  Service: Cardiovascular;  Laterality: N/A;  . LEFT AND RIGHT HEART CATHETERIZATION WITH CORONARY ANGIOGRAM N/A 12/17/2012   Procedure: LEFT AND RIGHT HEART CATHETERIZATION WITH CORONARY ANGIOGRAM;  Surgeon: Sherren Mocha, MD;  Location: [facility] CATH LAB;  Service: Cardiovascular;  Laterality: N/A;    Family History  Problem Relation Age of Onset  . Cancer Mother     Social History:  reports that she has quit smoking. She has never used smokeless tobacco. She reports that she does not drink alcohol or use  drugs.  Allergies:  Allergies  Allergen Reactions  . Strawberry Extract Hives, Itching and Other (See Comments)    Hives and itching     Medications: I have reviewed the patient's current medications.  Results for orders placed or performed during the hospital encounter of 03/04/17 (from the past 48 hour(s))  Basic metabolic panel     Status: Abnormal   Collection Time: 03/07/17  6:49 AM  Result Value Ref Range   Sodium 136 135 - 145 mmol/L   Potassium 3.9 3.5 - 5.1 mmol/L   Chloride 105 101 - 111 mmol/L   CO2 25 22 - 32 mmol/L   Glucose, Bld 111 (H) 65 - 99 mg/dL   BUN 23 (H) 6 - 20 mg/dL   Creatinine, Ser 1.63 (H) 0.44 - 1.00 mg/dL   Calcium 8.6 (L) 8.9 - 10.3 mg/dL   GFR calc non Af Amer 30 (L) >60 mL/min   GFR calc Af Amer 35 (L) >60 mL/min    Comment: (NOTE) The eGFR has been calculated using the CKD EPI equation. This calculation has not been validated in all clinical situations. eGFR's persistently <60 mL/min signify possible Chronic Kidney Disease.    Anion gap 6 5 - 15  CBC     Status: Abnormal   Collection Time: 03/07/17  6:49 AM  Result Value Ref Range   WBC 5.9 4.0 - 10.5 K/uL   RBC 3.68 (L) 3.87 - 5.11 MIL/uL   Hemoglobin 9.0 (L) 12.0 - 15.0 g/dL   HCT 27.7 (L) 36.0 - 46.0 %  MCV 75.3 (L) 78.0 - 100.0 fL   MCH 24.5 (L) 26.0 - 34.0 pg   MCHC 32.5 30.0 - 36.0 g/dL   RDW 21.7 (H) 11.5 - 15.5 %   Platelets 217 150 - 400 K/uL  TSH     Status: None   Collection Time: 03/07/17  6:49 AM  Result Value Ref Range   TSH 1.868 0.350 - 4.500 uIU/mL    Comment: Performed by a 3rd Generation assay with a functional sensitivity of <=0.01 uIU/mL.  Basic metabolic panel     Status: Abnormal   Collection Time: 03/08/17  4:35 AM  Result Value Ref Range   Sodium 137 135 - 145 mmol/L   Potassium 4.3 3.5 - 5.1 mmol/L   Chloride 107 101 - 111 mmol/L   CO2 24 22 - 32 mmol/L   Glucose, Bld 107 (H) 65 - 99 mg/dL   BUN 30 (H) 6 - 20 mg/dL   Creatinine, Ser 1.84 (H) 0.44 -  1.00 mg/dL   Calcium 8.6 (L) 8.9 - 10.3 mg/dL   GFR calc non Af Amer 26 (L) >60 mL/min   GFR calc Af Amer 30 (L) >60 mL/min    Comment: (NOTE) The eGFR has been calculated using the CKD EPI equation. This calculation has not been validated in all clinical situations. eGFR's persistently <60 mL/min signify possible Chronic Kidney Disease.    Anion gap 6 5 - 15  CBC     Status: Abnormal   Collection Time: 03/08/17  4:35 AM  Result Value Ref Range   WBC 8.9 4.0 - 10.5 K/uL   RBC 3.68 (L) 3.87 - 5.11 MIL/uL   Hemoglobin 9.0 (L) 12.0 - 15.0 g/dL   HCT 27.6 (L) 36.0 - 46.0 %   MCV 75.0 (L) 78.0 - 100.0 fL   MCH 24.5 (L) 26.0 - 34.0 pg   MCHC 32.6 30.0 - 36.0 g/dL   RDW 21.8 (H) 11.5 - 15.5 %   Platelets 201 150 - 400 K/uL  Uric acid     Status: None   Collection Time: 03/08/17  4:35 AM  Result Value Ref Range   Uric Acid, Serum 5.8 2.3 - 6.6 mg/dL    Dg Knee Complete 4 Views Right  Result Date: 03/08/2017 CLINICAL DATA:  No inj,had chronic pain rt knee for yrs ,but today started having pain and swelling all over rt knee EXAM: RIGHT KNEE - COMPLETE 4+ VIEW COMPARISON:  CT 07/12/2016 FINDINGS: No evidence of fracture, dislocation. Small suprapatellar bursa effusion. Marginal spurring and Subchondral irregularity involving patella and anterior femoral condyles. Extensive femoral-popliteal arterial calcifications. IMPRESSION: 1. Negative for fracture or other acute findings. 2. Advanced   femoral-popliteal DJD with effusion. Electronically Signed   By: Lucrezia Europe M.D.   On: 03/08/2017 11:30    Review of Systems  Constitutional: Negative for weight loss.  HENT: Negative for ear discharge, ear pain, hearing loss and tinnitus.   Eyes: Negative for blurred vision, double vision, photophobia and pain.  Respiratory: Negative for cough, sputum production and shortness of breath.   Cardiovascular: Negative for chest pain.  Gastrointestinal: Negative for abdominal pain, nausea and vomiting.   Genitourinary: Negative for dysuria, flank pain, frequency and urgency.  Musculoskeletal: Positive for joint pain (Right knee). Negative for back pain, falls, myalgias and neck pain.  Neurological: Negative for dizziness, tingling, sensory change, focal weakness, loss of consciousness and headaches.  Endo/Heme/Allergies: Does not bruise/bleed easily.  Psychiatric/Behavioral: Negative for depression, memory loss and substance abuse.  The patient is not nervous/anxious.    Blood pressure (!) 124/46, pulse (!) 53, temperature 97.6 F (36.4 C), temperature source Oral, resp. rate 14, height 5' 6"  (1.676 m), weight 64.7 kg (142 lb 9.6 oz), SpO2 95 %. Physical Exam  Constitutional: She appears well-developed and well-nourished. No distress.  HENT:  Head: Normocephalic.  Eyes: Conjunctivae are normal. Right eye exhibits no discharge. Left eye exhibits no discharge. No scleral icterus.  Cardiovascular: Normal rate and regular rhythm.   Respiratory: Effort normal. No respiratory distress.  Musculoskeletal:  RLE No traumatic wounds, ecchymosis, or rash  Knee TTP, esp laterally. Pain with AROM/PROM, range 100-170  No ankle effusion, mod-large knee effusion  Knee stable to varus/ valgus and anterior/posterior stress  Sens DPN, SPN, TN intact  Motor EHL, ext, flex, evers 5/5  DP 2+, PT 0, No significant edema  Neurological: She is alert.  Skin: Skin is warm and dry. She is not diaphoretic.  Psychiatric: She has a normal mood and affect. Her behavior is normal.    Assessment/Plan: Right knee pain/effusion -- Top ddx include exacerbation of preexisting OA, spontaneous hemarthrosis, and gout. Unsure if Dr. Erlinda Hong would feel comfortable with aspiration/injection given her concurrent Eliquis use but after explaining risks to patient she declines procedure and would like to see if it will resolve with time. Would encourage antiinflammatories (prednisone ok), compression, ice, and elevation. She may f/u with  Dr. Stann Mainland or Dr. Erlinda Hong as she prefers.    Lisette Abu, PA-C Orthopedic Surgery [phone] 03/08/2017, 12:54 PM

## 2017-03-08 NOTE — Progress Notes (Signed)
Purewick removed, patient says she will use BSC.

## 2017-03-09 DIAGNOSIS — E7800 Pure hypercholesterolemia, unspecified: Secondary | ICD-10-CM

## 2017-03-09 DIAGNOSIS — N170 Acute kidney failure with tubular necrosis: Secondary | ICD-10-CM

## 2017-03-09 DIAGNOSIS — I509 Heart failure, unspecified: Secondary | ICD-10-CM

## 2017-03-09 LAB — BASIC METABOLIC PANEL
ANION GAP: 6 (ref 5–15)
BUN: 48 mg/dL — ABNORMAL HIGH (ref 6–20)
CALCIUM: 8.4 mg/dL — AB (ref 8.9–10.3)
CHLORIDE: 104 mmol/L (ref 101–111)
CO2: 25 mmol/L (ref 22–32)
Creatinine, Ser: 2.61 mg/dL — ABNORMAL HIGH (ref 0.44–1.00)
GFR calc non Af Amer: 17 mL/min — ABNORMAL LOW (ref >60)
GFR, EST AFRICAN AMERICAN: 20 mL/min — AB (ref >60)
Glucose, Bld: 87 mg/dL (ref 65–99)
Potassium: 4 mmol/L (ref 3.5–5.1)
Sodium: 135 mmol/L (ref 135–145)

## 2017-03-09 MED ORDER — CARVEDILOL 6.25 MG PO TABS: 6.2500 mg | ORAL_TABLET | Freq: Two times a day (BID) | ORAL | Status: DC

## 2017-03-09 MED ORDER — HYDRALAZINE HCL 25 MG PO TABS
25.0000 mg | ORAL_TABLET | Freq: Three times a day (TID) | ORAL | Status: DC
  Administered 2017-03-10: 25 mg via ORAL
  Filled 2017-03-09: qty 1

## 2017-03-09 MED ORDER — CARVEDILOL 6.25 MG PO TABS
6.2500 mg | ORAL_TABLET | Freq: Two times a day (BID) | ORAL | Status: DC
  Filled 2017-03-09: qty 1

## 2017-03-09 MED ORDER — SODIUM CHLORIDE 0.9 % IV SOLN
INTRAVENOUS | Status: AC
  Administered 2017-03-09: 16:00:00 via INTRAVENOUS

## 2017-03-09 NOTE — Care Management Important Message (Signed)
Important Message  Patient Details  Name: Stephanie Yates MRN: [id number] Date of Birth: 01-Aug-1945   Medicare Important Message Given:  Yes    Cerissa Zeiger Montine Circle 03/09/2017, 12:01 PM

## 2017-03-09 NOTE — Progress Notes (Signed)
Progress Note  Patient Name: Stephanie Yates Date of Encounter: 03/09/2017  Primary Cardiologist: Dr. Caryl Comes  Subjective    She still has right knee pain.  Inpatient Medications    Scheduled Meds: . amiodarone  200 mg Oral Daily  . apixaban  5 mg Oral BID  . atorvastatin  40 mg Oral q1800  . [START ON 03/10/2017] carvedilol  6.25 mg Oral BID WC  . cholecalciferol  1,000 Units Oral BID  . [START ON 03/10/2017] hydrALAZINE  25 mg Oral Q8H  . isosorbide mononitrate  15 mg Oral Daily  . predniSONE  30 mg Oral Q breakfast   Continuous Infusions: . sodium chloride     PRN Meds: acetaminophen, hydrALAZINE, hydrOXYzine, morphine, nitroGLYCERIN, zolpidem   Vital Signs    Vitals:   03/08/17 2221 03/09/17 0442 03/09/17 0544 03/09/17 1022  BP: (!) 100/37 (!) 119/42 (!) 117/47 (!) 95/41  Pulse:  (!) 55 (!) 51 (!) 49  Resp:  18    Temp:  98 F (36.7 C)    TempSrc:  Oral    SpO2:  98%    Weight:  145 lb 8 oz (66 kg)    Height:        Intake/Output Summary (Last 24 hours) at 03/09/17 1237 Last data filed at 03/09/17 0941  Gross per 24 hour  Intake              360 ml  Output              900 ml  Net             -540 ml   Filed Weights   03/07/17 0422 03/08/17 0550 03/09/17 0442  Weight: 144 lb 6.4 oz (65.5 kg) 142 lb 9.6 oz (64.7 kg) 145 lb 8 oz (66 kg)    Telemetry    NSR - Personally Reviewed  Physical Exam   GEN: No acute distress.  HEENT: Normocephalic, atraumatic, sclera non-icteric. Neck: No JVD or bruits. Cardiac: RRR no murmurs, rubs, or gallops.  Radials/DP/PT 1+ and equal bilaterally.  Respiratory: CTA B/L, otherwise no wheezes. Breathing is unlabored. GI: Soft, nontender, non-distended, BS +x 4. MS: no deformity. Extremities: No clubbing or cyanosis. Right knee swelling and increased warmth. Distal pedal pulses are 2+ and equal bilaterally. Neuro:  AAOx3. Follows commands. Psych:  Responds to questions appropriately with a normal affect.  Labs      Chemistry  Recent Labs Lab 03/06/17 1000 03/07/17 0649 03/08/17 0435 03/09/17 0427  NA  --  136 137 135  K  --  3.9 4.3 4.0  CL  --  105 107 104  CO2  --  25 24 25   GLUCOSE  --  111* 107* 87  BUN  --  23* 30* 48*  CREATININE  --  1.63* 1.84* 2.61*  CALCIUM  --  8.6* 8.6* 8.4*  PROT 6.8  --   --   --   ALBUMIN 2.5*  --   --   --   AST 69*  --   --   --   ALT 27  --   --   --   ALKPHOS 75  --   --   --   BILITOT 1.2  --   --   --   GFRNONAA  --  30* 26* 17*  GFRAA  --  35* 30* 20*  ANIONGAP  --  6 6 6     Hematology  Recent Labs Lab 03/06/17  1610 03/07/17 0649 03/08/17 0435  WBC 6.1 5.9 8.9  RBC 3.63*  3.67* 3.68* 3.68*  HGB 9.2* 9.0* 9.0*  HCT 27.1* 27.7* 27.6*  MCV 74.7* 75.3* 75.0*  MCH 25.3* 24.5* 24.5*  MCHC 33.9 32.5 32.6  RDW 22.4* 21.7* 21.8*  PLT 229 217 201   Cardiac Enzymes  Recent Labs Lab 03/04/17 2040 03/05/17 0301 03/05/17 0649 03/05/17 1320  TROPONINI 0.13* 0.13* 0.12* 0.12*   No results for input(s): TROPIPOC in the last 168 hours.   BNP  Recent Labs Lab 03/04/17 2040  BNP 885.7*    DDimer No results for input(s): DDIMER in the last 168 hours.   Radiology    Dg Knee Complete 4 Views Right  Result Date: 03/08/2017 CLINICAL DATA:  No inj,had chronic pain rt knee for yrs ,but today started having pain and swelling all over rt knee EXAM: RIGHT KNEE - COMPLETE 4+ VIEW COMPARISON:  CT 07/12/2016 FINDINGS: No evidence of fracture, dislocation. Small suprapatellar bursa effusion. Marginal spurring and Subchondral irregularity involving patella and anterior femoral condyles. Extensive femoral-popliteal arterial calcifications. IMPRESSION: 1. Negative for fracture or other acute findings. 2. Advanced   femoral-popliteal DJD with effusion. Electronically Signed   By: Lucrezia Europe M.D.   On: 03/08/2017 11:30    Cardiac Studies   2D echo 03/2013 Study Conclusions  - Left ventricle: The cavity size was normal. Wall thickness was increased  in a pattern of mild LVH. Systolic function was moderately reduced. The estimated ejection fraction was in the range of 35% to 40%. Possible mild hypokinesis of the inferior myocardium. Doppler parameters are consistent with abnormal left ventricular relaxation (grade 1 diastolic dysfunction). - Mitral valve: Calcified annulus. Mildly thickened leaflets . - Left atrium: The atrium was mildly dilated  03/06/2017 - Left ventricle: LVEF is approximately 20 to 25% with akinesis of   the mid/distal inferior, mid/distal inferoseptal, distal lateral   and apical walls ; hypokinesis of the base/mid inferior, the   base/mid lateral and distal anterior walls. The cavity size was   normal. Wall thickness was increased in a pattern of mild LVH.   Doppler parameters are consistent with abnormal left ventricular   relaxation (grade 1 diastolic dysfunction). Doppler parameters   are consistent with high ventricular filling pressure. - Mitral valve: There was mild regurgitation. - Pulmonary arteries: PA peak pressure: 44 mm Hg (S).   Patient Profile     71 y.o. female with chronic systolic CHF/NICM s/p St. Jude ICD (dx 2014 when she presented with ventricular tachycardia, EF 10-15% in 2014 with nonobstructive CAD, improved to 35-40% in 03/2013), VT/PVCs (placed on amiodarone in 2014), paroxysmal atrial fib, sinus bradycardia, tobacco abuse, CKD III, HTN, HLD, prior abnormal LFTs, anemia presented to [facility] with progressive SOB, chest pressure, edema in setting of running out of medicines for 1 week. Scale broken at home.  Assessment & Plan    1. Acute on chronic combined CHF in setting of med noncompliance - - now euvolemic, Crea up trending, I would continue holding lasix today and start Lasix 40 mg PO daily at discharge. She is now at her dry weight - 142 lbs.  LVEF 20-25%, previously 35-40%. BP now low after adding hydralazine 25 mg PO TID. I agree with decreased dose of  carvedilol. Crea 1.76->1.63-->1.84->2.6.   2. H/o PVCs/VT - Decrease amiodarone to 200 mg po daily. No VT on telemetry. K 4.3. LFTs and thyroid both normal.  3. Paroxysmal atrial fib - maintaining NSR.  Not clear why she has been on lower dose apixaban as OP. Dr. Olin Pia OV from 04/2014 states "We'll continue her on apixaban. At 2.5 mg  Bid given renal insufficiency and wt 73 kg." At most recent OV with Tommye Standard PA-C, she tried to call patient after visit to discuss increasing to 5mg  BID but were unable to connect. Per review with Dr. Meda Coffee, will increase.  4. CKD stage III - as above  5. Chest pressure/elevated troponin - suspect demand ischemia in setting of #1. Dr. Marlou Porch did not feel further ischemic w/u was necessary. Prior nonobstructive CAD by cath in 2014. LDL 101 in setting of noncompliance, will need f/u as OP. She was not on aspirin as OP - will discontinue.  6. Knee pain - followed by primary team, possibly pseudogout triggered by diuretics   Ena Dawley, MD 03/09/2017

## 2017-03-09 NOTE — Progress Notes (Signed)
Called by RN re: BP/HR  SBP 95, HR 49. Hold Coreg, amio and Imdur till seen on rounds.  Rosaria Ferries, Hershal Coria 03/09/2017 10:36 AM Beeper [phone]

## 2017-03-09 NOTE — Progress Notes (Signed)
1022 - BP 95/41; HR 49; Per Rhonda Barrett, PA-C, hold the scheduled Coreg, Amiodarone, and Imdur.

## 2017-03-09 NOTE — Progress Notes (Signed)
[facility] PROGRESS NOTE  Stephanie Yates FMB:[id number] DOB: Jun 23, 1946 DOA: 03/04/2017 PCP: Josetta Huddle, MD  Interim summary and HPI 71 y.o. female with medical history significant of CHF with EF of 35-40 percent, hypertension, hyperlipidemia, sinus bradycardia, former smoker, ICD placement, atrial fibrillation on Eliquis, CKD-3, who presents with shortness of breath and chest pain.   Subjective.  Patient in bed, appears comfortable, denies any headache, no fever, no chest pain or pressure, no shortness of breath , no abdominal pain. No focal weakness.  Assessment/Plan:  1- Acute on chronic systolic heart failure with EF of 20-25% on echocardiogram. Due to noncompliance with diet, improving with IV Lasix, TED stockings, much improved symptoms, troponin trend was flat and a non-ACS pattern and was high likely due to demand ischemia from CHF. Blood pressure heart rate slightly soft with elevated creatinine on 03/09/2017 will hold further diuresis, skip today's professional medications, correct Coreg in half, discussed with cardiologist Dr. Meda Coffee. No ACE/ARB due to renal failure.  2- Chronic A. Fib - [place] vasc 2 score 4. On Eliquis which will be continued, beta blocker dose reduced as in #1 above, on review cardiology to address.   3- HXof PVC's/VT - monitor electrolytes closely, beta blocker and amiodarone as tolerated.   4- ARF on CKD stage 3 - baseline creatinine close to 1.5, has developed some acute renal failure, continue to hold ACE/ARB, hold further diuretics, and downward pressure medications, gently hydrate for 5 hours on 03/09/2017 and monitor.   5- AOCD  -  stable no acute issues.   6-HLD - statins continue.   7- Right knee arthritis and pain. X-ray stable, uric acid stable, could be gout seen by orthopedic and placed on oral prednisone will monitor, patient refused arthrocentesis..    Code Status: Full Family Communication: no family at bedside today. Disposition  Plan: continue IV lasix for another 24 hours as recommended by cardiology; will use prednisone for acute gout; follow clinical response. Most likely home in the next 24-48 hours. DVT - prophylaxis with Eliquis Consults. Cardiology, orthopnea  Consultants:  Cardiology   Ortho  Procedures:  2-D echo   - Left ventricle: LVEF is approximately 20 to 25% with akinesis of  the mid/distal inferior, mid/distal inferoseptal, distal lateral  and apical walls ; hypokinesis of the base/mid inferior, the  base/mid lateral and distal anterior walls. The cavity size was normal. Wall thickness was increased in a pattern of mild LVH.  Doppler parameters are consistent with abnormal left ventricular relaxation (grade 1 diastolic dysfunction). Doppler parameters are consistent with high ventricular filling pressure. - Mitral valve: There was mild regurgitation. - Pulmonary arteries: PA peak pressure: 44 mm Hg (S).  Antibiotics:  None    Objective: Vitals:   03/09/17 0544 03/09/17 1022  BP: (!) 117/47 (!) 95/41  Pulse: (!) 51 (!) 49  Resp:    Temp:    SpO2:      Intake/Output Summary (Last 24 hours) at 03/09/17 1226 Last data filed at 03/09/17 0941  Gross per 24 hour  Intake              360 ml  Output              900 ml  Net             -540 ml   Filed Weights   03/07/17 0422 03/08/17 0550 03/09/17 0442  Weight: 65.5 kg (144 lb 6.4 oz) 64.7 kg (142 lb 9.6 oz) 66 kg (145  lb 8 oz)     Exam:  Awake Alert, Oriented X 3, No new F.N deficits, Normal affect [place].AT,PERRAL Supple Neck,No JVD, No cervical lymphadenopathy appriciated.  Symmetrical Chest wall movement, Good air movement bilaterally,+ve rales RRR,No Gallops,Rubs or new Murmurs, No Parasternal Heave +ve B.Sounds, Abd Soft, No tenderness, No organomegaly appriciated, No rebound - guarding or rigidity. No Cyanosis, Clubbing or edema, No new Rash or bruise  R.Knee swollen, with Moderate effusion and some tenderness, no  warmth    Data Reviewed: Basic Metabolic Panel:  Recent Labs Lab 03/04/17 2040 03/06/17 0344 03/07/17 0649 03/08/17 0435 03/09/17 0427  NA 130* 137 136 137 135  K 3.9 3.6 3.9 4.3 4.0  CL 101 107 105 107 104  CO2 21* 25 25 24 25   GLUCOSE 84 86 111* 107* 87  BUN 23* 23* 23* 30* 48*  CREATININE 1.76* 1.69* 1.63* 1.84* 2.61*  CALCIUM 8.2* 8.2* 8.6* 8.6* 8.4*   Liver Function Tests:  Recent Labs Lab 03/06/17 1000  AST 69*  ALT 27  ALKPHOS 75  BILITOT 1.2  PROT 6.8  ALBUMIN 2.5*   CBC:  Recent Labs Lab 03/04/17 2040 03/05/17 0649 03/06/17 0344 03/07/17 0649 03/08/17 0435  WBC 6.2 6.2 6.1 5.9 8.9  HGB 9.6* 8.7* 9.2* 9.0* 9.0*  HCT 29.3* 26.2* 27.1* 27.7* 27.6*  MCV 75.9* 75.1* 74.7* 75.3* 75.0*  PLT 217 203 229 217 201   Cardiac Enzymes:  Recent Labs Lab 03/04/17 2040 03/05/17 0301 03/05/17 0649 03/05/17 1320  TROPONINI 0.13* 0.13* 0.12* 0.12*   BNP (last 3 results)  Recent Labs  03/16/16 1457 03/04/17 2040  BNP 367.0* 885.7*    Studies: Dg Knee Complete 4 Views Right  Result Date: 03/08/2017 CLINICAL DATA:  No inj,had chronic pain rt knee for yrs ,but today started having pain and swelling all over rt knee EXAM: RIGHT KNEE - COMPLETE 4+ VIEW COMPARISON:  CT 07/12/2016 FINDINGS: No evidence of fracture, dislocation. Small suprapatellar bursa effusion. Marginal spurring and Subchondral irregularity involving patella and anterior femoral condyles. Extensive femoral-popliteal arterial calcifications. IMPRESSION: 1. Negative for fracture or other acute findings. 2. Advanced   femoral-popliteal DJD with effusion. Electronically Signed   By: Lucrezia Europe M.D.   On: 03/08/2017 11:30    Scheduled Meds: . amiodarone  200 mg Oral Daily  . apixaban  5 mg Oral BID  . atorvastatin  40 mg Oral q1800  . [START ON 03/10/2017] carvedilol  6.25 mg Oral BID WC  . cholecalciferol  1,000 Units Oral BID  . [START ON 03/10/2017] hydrALAZINE  25 mg Oral Q8H  .  isosorbide mononitrate  15 mg Oral Daily  . predniSONE  30 mg Oral Q breakfast   Continuous Infusions: . sodium chloride      Principal Problem:   Acute on chronic combined systolic and diastolic CHF (congestive heart failure) ([facility]) Active Problems:   Hypertension   Elevated troponin   PAF (paroxysmal atrial fibrillation) ([place])   ICD (implantable cardioverter-defibrillator), dual, st Judes   HLD (hyperlipidemia)   CKD (chronic kidney disease), stage III   Chest pain   Microcytic anemia    Time spent: 30 minutes  Signature  Lala Lund M.D on 03/09/2017 at 12:26 PM  Between 7am to 7pm - Pager - [phone] ( page via [email or web address], text pages only, please mention full 10 digit call back number).  After 7pm go to [email or web address] - password [email or web address]

## 2017-03-10 ENCOUNTER — Other Ambulatory Visit: Payer: Medicare Other

## 2017-03-10 DIAGNOSIS — Z7901 Long term (current) use of anticoagulants: Secondary | ICD-10-CM

## 2017-03-10 LAB — BASIC METABOLIC PANEL
Anion gap: 4 — ABNORMAL LOW (ref 5–15)
BUN: 53 mg/dL — ABNORMAL HIGH (ref 6–20)
CALCIUM: 8.5 mg/dL — AB (ref 8.9–10.3)
CO2: 25 mmol/L (ref 22–32)
CREATININE: 2.08 mg/dL — AB (ref 0.44–1.00)
Chloride: 107 mmol/L (ref 101–111)
GFR calc non Af Amer: 22 mL/min — ABNORMAL LOW (ref >60)
GFR, EST AFRICAN AMERICAN: 26 mL/min — AB (ref >60)
Glucose, Bld: 88 mg/dL (ref 65–99)
Potassium: 4.3 mmol/L (ref 3.5–5.1)
SODIUM: 136 mmol/L (ref 135–145)

## 2017-03-10 MED ORDER — HYDRALAZINE HCL 10 MG PO TABS: 10.0000 mg | ORAL_TABLET | Freq: Three times a day (TID) | ORAL | Status: DC

## 2017-03-10 MED ORDER — CARVEDILOL 3.125 MG PO TABS
3.1250 mg | ORAL_TABLET | Freq: Two times a day (BID) | ORAL | Status: DC
  Administered 2017-03-10: 3.125 mg via ORAL
  Filled 2017-03-10 (×4): qty 1

## 2017-03-10 MED ORDER — APIXABAN 2.5 MG PO TABS
2.5000 mg | ORAL_TABLET | Freq: Two times a day (BID) | ORAL | Status: DC
  Administered 2017-03-10 – 2017-03-13 (×6): 2.5 mg via ORAL
  Filled 2017-03-10 (×6): qty 1

## 2017-03-10 NOTE — Progress Notes (Addendum)
Progress Note  Patient Name: Stephanie Yates Date of Encounter: 03/10/2017  Primary Cardiologist: Dr. Caryl Comes  Subjective    The patient feels a little better, minimal SOB, ongoing right knee pain.  Inpatient Medications    Scheduled Meds: . amiodarone  200 mg Oral Daily  . apixaban  5 mg Oral BID  . atorvastatin  40 mg Oral q1800  . carvedilol  6.25 mg Oral BID WC  . cholecalciferol  1,000 Units Oral BID  . hydrALAZINE  10 mg Oral Q8H  . isosorbide mononitrate  15 mg Oral Daily  . predniSONE  30 mg Oral Q breakfast   Continuous Infusions:  PRN Meds: acetaminophen, hydrALAZINE, hydrOXYzine, morphine, nitroGLYCERIN, zolpidem   Vital Signs    Vitals:   03/09/17 1948 03/10/17 0537 03/10/17 0952 03/10/17 0954  BP: (!) 130/47 (!) 127/36  (!) 104/38  Pulse: (!) 50 (!) 55  (!) 54  Resp: 18 18    Temp: 98.2 F (36.8 C) 97.9 F (36.6 C)    TempSrc: Oral Oral    SpO2: 98% 97%    Weight:   147 lb 8 oz (66.9 kg)   Height:        Intake/Output Summary (Last 24 hours) at 03/10/17 1242 Last data filed at 03/10/17 0953  Gross per 24 hour  Intake           788.33 ml  Output              850 ml  Net           -61.67 ml   Filed Weights   03/08/17 0550 03/09/17 0442 03/10/17 0952  Weight: 142 lb 9.6 oz (64.7 kg) 145 lb 8 oz (66 kg) 147 lb 8 oz (66.9 kg)    Telemetry    NSR - Personally Reviewed  Physical Exam   GEN: No acute distress.  HEENT: Normocephalic, atraumatic, sclera non-icteric. Neck: No JVD or bruits. Cardiac: RRR no murmurs, rubs, or gallops.  Radials/DP/PT 1+ and equal bilaterally.  Respiratory: CTA B/L, otherwise no wheezes. Breathing is unlabored. GI: Soft, nontender, non-distended, BS +x 4. MS: no deformity. Extremities: No clubbing or cyanosis. Right knee swelling and increased warmth. Distal pedal pulses are 2+ and equal bilaterally. Neuro:  AAOx3. Follows commands. Psych:  Responds to questions appropriately with a normal affect.  Labs      Chemistry  Recent Labs Lab 03/06/17 1000  03/08/17 0435 03/09/17 0427 03/10/17 0602  NA  --   < > 137 135 136  K  --   < > 4.3 4.0 4.3  CL  --   < > 107 104 107  CO2  --   < > 24 25 25   GLUCOSE  --   < > 107* 87 88  BUN  --   < > 30* 48* 53*  CREATININE  --   < > 1.84* 2.61* 2.08*  CALCIUM  --   < > 8.6* 8.4* 8.5*  PROT 6.8  --   --   --   --   ALBUMIN 2.5*  --   --   --   --   AST 69*  --   --   --   --   ALT 27  --   --   --   --   ALKPHOS 75  --   --   --   --   BILITOT 1.2  --   --   --   --  GFRNONAA  --   < > 26* 17* 22*  GFRAA  --   < > 30* 20* 26*  ANIONGAP  --   < > 6 6 4*  < > = values in this interval not displayed.  Hematology  Recent Labs Lab 03/06/17 0344 03/07/17 0649 03/08/17 0435  WBC 6.1 5.9 8.9  RBC 3.63*  3.67* 3.68* 3.68*  HGB 9.2* 9.0* 9.0*  HCT 27.1* 27.7* 27.6*  MCV 74.7* 75.3* 75.0*  MCH 25.3* 24.5* 24.5*  MCHC 33.9 32.5 32.6  RDW 22.4* 21.7* 21.8*  PLT 229 217 201   Cardiac Enzymes  Recent Labs Lab 03/04/17 2040 03/05/17 0301 03/05/17 0649 03/05/17 1320  TROPONINI 0.13* 0.13* 0.12* 0.12*   No results for input(s): TROPIPOC in the last 168 hours.   BNP  Recent Labs Lab 03/04/17 2040  BNP 885.7*    DDimer No results for input(s): DDIMER in the last 168 hours.   Radiology    No results found.  Cardiac Studies   2D echo 03/2013 Study Conclusions  - Left ventricle: The cavity size was normal. Wall thickness was increased in a pattern of mild LVH. Systolic function was moderately reduced. The estimated ejection fraction was in the range of 35% to 40%. Possible mild hypokinesis of the inferior myocardium. Doppler parameters are consistent with abnormal left ventricular relaxation (grade 1 diastolic dysfunction). - Mitral valve: Calcified annulus. Mildly thickened leaflets . - Left atrium: The atrium was mildly dilated  03/06/2017 - Left ventricle: LVEF is approximately 20 to 25% with akinesis of    the mid/distal inferior, mid/distal inferoseptal, distal lateral   and apical walls ; hypokinesis of the base/mid inferior, the   base/mid lateral and distal anterior walls. The cavity size was   normal. Wall thickness was increased in a pattern of mild LVH.   Doppler parameters are consistent with abnormal left ventricular   relaxation (grade 1 diastolic dysfunction). Doppler parameters   are consistent with high ventricular filling pressure. - Mitral valve: There was mild regurgitation. - Pulmonary arteries: PA peak pressure: 44 mm Hg (S).   Patient Profile     71 y.o. female with chronic systolic CHF/NICM s/p St. Jude ICD (dx 2014 when she presented with ventricular tachycardia, EF 10-15% in 2014 with nonobstructive CAD, improved to 35-40% in 03/2013), VT/PVCs (placed on amiodarone in 2014), paroxysmal atrial fib, sinus bradycardia, tobacco abuse, CKD III, HTN, HLD, prior abnormal LFTs, anemia presented to [facility] with progressive SOB, chest pressure, edema in setting of running out of medicines for 1 week. Scale broken at home.  Assessment & Plan    1. Acute on chronic combined CHF in setting of med noncompliance - - now euvolemic, Crea up trending 1.6->1.8-2.6->2.0, I would continue to hold today and then start Lasix 40 mg PO daily at the discharge Her weight is up 147 lbs, dry weight 142 lbs. Minimal crackles at the left base, but she is hypotensive. She is bradycardic, I would decrease carvedilol to 3.125 mg po BID.  LVEF 20-25%, previously 35-40%.  2. H/o PVCs/VT - resumption of amiodarone this admission. No VT on telemetry. K 4.3. LFTs and thyroid both normal.  3. Paroxysmal atrial fib - maintaining NSR. Eliquis back to 2.5 with worsening kidney failure.  4. CKD stage III - as above  5. Chest pressure/elevated troponin - suspect demand ischemia in setting of #1. Dr. Marlou Porch did not feel further ischemic w/u was necessary. Prior nonobstructive CAD by cath in 2014. LDL  101 in setting of noncompliance, will need f/u as OP. She was not on aspirin as OP - will discontinue.  6. Knee pain - followed by primary team, possibly pseudogout triggered by diuretics.  She is now hypotensive, I suspect sepsis from possible knee infection, ?: antibiotics initiation. Hold diuretics. Hold hydralazine and imdur, decreased carvedilol dose.  Ena Dawley, MD 03/10/2017

## 2017-03-10 NOTE — Progress Notes (Signed)
[facility] PROGRESS NOTE  Stephanie Yates CLE:[id number] DOB: [birth year] DOA: 03/04/2017 PCP: Josetta Huddle, MD  Interim summary and HPI 71 y.o. female with medical history significant of CHF with EF of 35-40 percent, hypertension, hyperlipidemia, sinus bradycardia, former smoker, ICD placement, atrial fibrillation on Eliquis, CKD-3, who presents with shortness of breath and chest pain.   Subjective.  Patient in bed, appears comfortable, denies any headache, no fever, no chest pain or pressure, no shortness of breath , no abdominal pain. No focal weakness. Right knee pain somewhat better.   Assessment/Plan:  1- Acute on chronic systolic heart failure with EF of 20-25% on echocardiogram. Due to noncompliance with diet, improving with IV Lasix, TED stockings, much improved symptoms, troponin trend was flat and a non-ACS pattern and was high likely due to demand ischemia from CHF.   Her blood pressure bottomed out on 03/09/2017 and she became bradycardic, renal function also declined, hence further diuretics were held, blood pressure medications I treated down, case was discussed with cardiology, creatinine has improved on 03/10/2017 so his blood pressure, I have reduced her hydralazine dose further, still has mild fluid overload, will defer to cardiology on starting possibly oral Lasix today.  2- Chronic A. Fib - [place] vasc 2 score 4. On Eliquis which will be continued, beta blocker dose reduced as in #1 above, continue Eliquis, on amiodarone as well. Monitor heart rate closely. Cardiology on board, monitor heart rate.   3- HXof PVC's/VT - monitor electrolytes closely, beta blocker and amiodarone as tolerated.   4- ARF on CKD stage 3 - baseline creatinine close to 1.5, has developed some acute renal failure, continue to hold ACE/ARB, changes made as in #1 above, renal function has improved on 03/10/2017 after gentle IV fluids yesterday and cutting blood pressure medications.   5- AOCD  -   stable no acute issues.   6-HLD - statins continue.   7- Right knee arthritis and pain. X-ray stable, uric acid stable, could be gout seen by orthopedic and placed on oral prednisone will monitor, patient refused arthrocentesis. Gradual improvement.    Code Status: Full Family Communication: no family at bedside today. Disposition Plan: continue IV lasix for another 24 hours as recommended by cardiology; will use prednisone for acute gout; follow clinical response. Most likely home in the next 24-48 hours. DVT - prophylaxis with Eliquis Consults. Cardiology, orthopnea  Consultants:  Cardiology   Ortho  Procedures:  2-D echo   - Left ventricle: LVEF is approximately 20 to 25% with akinesis of  the mid/distal inferior, mid/distal inferoseptal, distal lateral  and apical walls ; hypokinesis of the base/mid inferior, the  base/mid lateral and distal anterior walls. The cavity size was normal. Wall thickness was increased in a pattern of mild LVH.  Doppler parameters are consistent with abnormal left ventricular relaxation (grade 1 diastolic dysfunction). Doppler parameters are consistent with high ventricular filling pressure. - Mitral valve: There was mild regurgitation. - Pulmonary arteries: PA peak pressure: 44 mm Hg (S).  Antibiotics:  None    Objective: Vitals:   03/09/17 1948 03/10/17 0537  BP: (!) 130/47 (!) 127/36  Pulse: (!) 50 (!) 55  Resp: 18 18  Temp: 98.2 F (36.8 C) 97.9 F (36.6 C)  SpO2: 98% 97%    Intake/Output Summary (Last 24 hours) at 03/10/17 0949 Last data filed at 03/10/17 0920  Gross per 24 hour  Intake           613.33 ml  Output  1150 ml  Net          -536.67 ml   Filed Weights   03/07/17 0422 03/08/17 0550 03/09/17 0442  Weight: 65.5 kg (144 lb 6.4 oz) 64.7 kg (142 lb 9.6 oz) 66 kg (145 lb 8 oz)     Exam:  Awake Alert, Oriented X 3, No new F.N deficits, Normal affect [place].AT,PERRAL Supple Neck,No JVD, No cervical  lymphadenopathy appriciated.  Symmetrical Chest wall movement, Good air movement bilaterally, +VE RALES RRR,No Gallops,Rubs or new Murmurs, No Parasternal Heave +ve B.Sounds, Abd Soft, No tenderness, No organomegaly appriciated, No rebound - guarding or rigidity. No Cyanosis, Clubbing or edema, No new Rash or bruise R.Knee swollen, with Moderate effusion and some tenderness, no warmth    Data Reviewed: Basic Metabolic Panel:  Recent Labs Lab 03/06/17 0344 03/07/17 0649 03/08/17 0435 03/09/17 0427 03/10/17 0602  NA 137 136 137 135 136  K 3.6 3.9 4.3 4.0 4.3  CL 107 105 107 104 107  CO2 25 25 24 25 25   GLUCOSE 86 111* 107* 87 88  BUN 23* 23* 30* 48* 53*  CREATININE 1.69* 1.63* 1.84* 2.61* 2.08*  CALCIUM 8.2* 8.6* 8.6* 8.4* 8.5*   Liver Function Tests:  Recent Labs Lab 03/06/17 1000  AST 69*  ALT 27  ALKPHOS 75  BILITOT 1.2  PROT 6.8  ALBUMIN 2.5*   CBC:  Recent Labs Lab 03/04/17 2040 03/05/17 0649 03/06/17 0344 03/07/17 0649 03/08/17 0435  WBC 6.2 6.2 6.1 5.9 8.9  HGB 9.6* 8.7* 9.2* 9.0* 9.0*  HCT 29.3* 26.2* 27.1* 27.7* 27.6*  MCV 75.9* 75.1* 74.7* 75.3* 75.0*  PLT 217 203 229 217 201   Cardiac Enzymes:  Recent Labs Lab 03/04/17 2040 03/05/17 0301 03/05/17 0649 03/05/17 1320  TROPONINI 0.13* 0.13* 0.12* 0.12*   BNP (last 3 results)  Recent Labs  03/16/16 1457 03/04/17 2040  BNP 367.0* 885.7*    Studies: Dg Knee Complete 4 Views Right  Result Date: 03/08/2017 CLINICAL DATA:  No inj,had chronic pain rt knee for yrs ,but today started having pain and swelling all over rt knee EXAM: RIGHT KNEE - COMPLETE 4+ VIEW COMPARISON:  CT 07/12/2016 FINDINGS: No evidence of fracture, dislocation. Small suprapatellar bursa effusion. Marginal spurring and Subchondral irregularity involving patella and anterior femoral condyles. Extensive femoral-popliteal arterial calcifications. IMPRESSION: 1. Negative for fracture or other acute findings. 2. Advanced    femoral-popliteal DJD with effusion. Electronically Signed   By: Lucrezia Europe M.D.   On: 03/08/2017 11:30    Scheduled Meds: . amiodarone  200 mg Oral Daily  . apixaban  5 mg Oral BID  . atorvastatin  40 mg Oral q1800  . carvedilol  6.25 mg Oral BID WC  . cholecalciferol  1,000 Units Oral BID  . hydrALAZINE  10 mg Oral Q8H  . isosorbide mononitrate  15 mg Oral Daily  . predniSONE  30 mg Oral Q breakfast   Continuous Infusions:   Principal Problem:   Acute on chronic combined systolic and diastolic CHF (congestive heart failure) (HCC) Active Problems:   Hypertension   Elevated troponin   PAF (paroxysmal atrial fibrillation) ([place])   ICD (implantable cardioverter-defibrillator), dual, st Judes   HLD (hyperlipidemia)   CKD (chronic kidney disease), stage III   Chest pain   Microcytic anemia   Acute on chronic congestive heart failure ([place])    Time spent: 30 minutes  Signature  Lala Lund M.D on 03/10/2017 at 9:49 AM  Between 7am to 7pm -  Pager - [phone] ( page via [email or web address], text pages only, please mention full 10 digit call back number).  After 7pm go to [email or web address] - password [facility]

## 2017-03-10 NOTE — Progress Notes (Addendum)
0954 - BP 104/38; HR 54; scheduled to receive coreg, amiodarone, and imdur.  Have AMION paged cardiology to make them aware before giving these medications.  1254 - AM scheduled meds coreg, amiodarone, and Imdur were not given per Dr. Meda Coffee.

## 2017-03-11 LAB — BASIC METABOLIC PANEL
ANION GAP: 3 — AB (ref 5–15)
BUN: 54 mg/dL — ABNORMAL HIGH (ref 6–20)
CO2: 25 mmol/L (ref 22–32)
CREATININE: 1.79 mg/dL — AB (ref 0.44–1.00)
Calcium: 8.2 mg/dL — ABNORMAL LOW (ref 8.9–10.3)
Chloride: 110 mmol/L (ref 101–111)
GFR calc Af Amer: 31 mL/min — ABNORMAL LOW (ref >60)
GFR, EST NON AFRICAN AMERICAN: 27 mL/min — AB (ref >60)
GLUCOSE: 84 mg/dL (ref 65–99)
Potassium: 4.1 mmol/L (ref 3.5–5.1)
Sodium: 138 mmol/L (ref 135–145)

## 2017-03-11 MED ORDER — AMIODARONE HCL 200 MG PO TABS
200.0000 mg | ORAL_TABLET | Freq: Every day | ORAL | Status: DC
  Administered 2017-03-11 – 2017-03-13 (×3): 200 mg via ORAL
  Filled 2017-03-11 (×3): qty 1

## 2017-03-11 MED ORDER — AMIODARONE HCL 200 MG PO TABS: 200.0000 mg | ORAL_TABLET | Freq: Every day | ORAL | Status: DC

## 2017-03-11 NOTE — Progress Notes (Signed)
Subjective   Still with some SOB this AM, not quite back to baseline  Inpatient Medications    Scheduled Meds: . amiodarone  200 mg Oral Daily  . apixaban  2.5 mg Oral BID  . atorvastatin  40 mg Oral q1800  . carvedilol  3.125 mg Oral BID WC  . cholecalciferol  1,000 Units Oral BID   Continuous Infusions:  PRN Meds: acetaminophen, hydrOXYzine, morphine, nitroGLYCERIN, zolpidem   Vital Signs    Vitals:   03/10/17 1728 03/10/17 1955 03/11/17 0434 03/11/17 0900  BP: (!) 142/51 (!) 110/40 (!) 118/37 (!) 135/44  Pulse: (!) 53 (!) 54 (!) 50   Resp:  18 18 16   Temp:  (!) 97.4 F (36.3 C) 98 F (36.7 C)   TempSrc:  Oral Oral   SpO2:  97% 98% 98%  Weight:   148 lb 1.6 oz (67.2 kg)   Height:        Intake/Output Summary (Last 24 hours) at 03/11/17 1247 Last data filed at 03/11/17 0900  Gross per 24 hour  Intake              535 ml  Output              850 ml  Net             -315 ml   Filed Weights   03/09/17 0442 03/10/17 0952 03/11/17 0434  Weight: 145 lb 8 oz (66 kg) 147 lb 8 oz (66.9 kg) 148 lb 1.6 oz (67.2 kg)    Telemetry    ECG    Physical Exam   GEN: No acute distress.   Neck: No JVD Cardiac: RRR, no murmurs, rubs, or gallops.  Respiratory: Clear to auscultation bilaterally. GI: Soft, nontender, non-distended  MS: trace bilateral edema Neuro:  Nonfocal  Psych: Normal affect   Labs    Chemistry Recent Labs Lab 03/06/17 1000  03/09/17 0427 03/10/17 0602 03/11/17 0621  NA  --   < > 135 136 138  K  --   < > 4.0 4.3 4.1  CL  --   < > 104 107 110  CO2  --   < > 25 25 25   GLUCOSE  --   < > 87 88 84  BUN  --   < > 48* 53* 54*  CREATININE  --   < > 2.61* 2.08* 1.79*  CALCIUM  --   < > 8.4* 8.5* 8.2*  PROT 6.8  --   --   --   --   ALBUMIN 2.5*  --   --   --   --   AST 69*  --   --   --   --   ALT 27  --   --   --   --   ALKPHOS 75  --   --   --   --   BILITOT 1.2  --   --   --   --   GFRNONAA  --   < > 17* 22* 27*  GFRAA  --   < > 20*  26* 31*  ANIONGAP  --   < > 6 4* 3*  < > = values in this interval not displayed.   Hematology Recent Labs Lab 03/06/17 0344 03/07/17 0649 03/08/17 0435  WBC 6.1 5.9 8.9  RBC 3.63*  3.67* 3.68* 3.68*  HGB 9.2* 9.0* 9.0*  HCT 27.1* 27.7* 27.6*  MCV 74.7* 75.3* 75.0*  MCH 25.3* 24.5* 24.5*  MCHC 33.9 32.5 32.6  RDW 22.4* 21.7* 21.8*  PLT 229 217 201    Cardiac Enzymes Recent Labs Lab 03/04/17 2040 03/05/17 0301 03/05/17 0649 03/05/17 1320  TROPONINI 0.13* 0.13* 0.12* 0.12*   No results for input(s): TROPIPOC in the last 168 hours.   BNP Recent Labs Lab 03/04/17 2040  BNP 885.7*     DDimer No results for input(s): DDIMER in the last 168 hours.   Radiology    No results found.  Cardiac Studies    Patient Profile     71 y.o. female with chronic systolic CHF/NICM s/p St. Jude ICD (dx 2014 when she presented with ventricular tachycardia, EF 10-15% in 2014 with nonobstructive CAD, improved to 35-40% in 03/2013), VT/PVCs (placed on amiodarone in 2014), paroxysmal atrial fib, sinus bradycardia, tobacco abuse, CKD III, HTN, HLD, prior abnormal LFTs, anemia presented to [facility] with progressive SOB, chest pressure, edema in setting of running out of medicines for 1 week. Scale broken at home.  Assessment & Plan    1. Acute on chronic combined CHF - currently appears euvolemic, diuretics on hold due to hypotension. WOuld restart lasix 40mg  daily when bp's normalize - meds decreased and held yesterday due to low bp's, currently just on coreg 3.125mg  bid. Are improving. - still with soft bp's at times, continue to hold diuretics at this time, likely start oral tomorrow.   2. PVCs/VT - on amiodarone, beta blocker  3. PAF - on beta blocker, amio, eliquis  4. Troponin elevation - fairly mild and flat in initialy setting of CHF, from prior notes no plans for ischemic testing, suspected demand ischemia.  - high theshold for cath due to poor renal function.  She did have a drop in her LVEF, cath may need to be reconsidered pending her clinical course as outpatient.     For questions or updates, please contact [place] Please consult [email or web address] for contact info under Cardiology/STEMI.      Merrily Pew, MD  03/11/2017, 12:47 PM

## 2017-03-11 NOTE — Progress Notes (Signed)
[facility] PROGRESS NOTE  Stephanie Yates PRF:[id number] DOB: 01-28-1946 DOA: 03/04/2017 PCP: Josetta Huddle, MD  Interim summary and HPI 71 y.o. female with medical history significant of CHF with EF of 35-40 percent, hypertension, hyperlipidemia, sinus bradycardia, former smoker, ICD placement, atrial fibrillation on Eliquis, CKD-3, who presents with shortness of breath and chest pain.   Subjective.  Patient in bed, appears comfortable, denies any headache, no fever, no chest pain or pressure, no shortness of breath , no abdominal pain. No focal weakness. Right knee pain somewhat better.   Assessment/Plan:  Acute on chronic systolic heart failure with EF of 20-25% on echocardiogram. H/o NICM with VT s/p ICD -Reduced ef compare to that from 2014 -?Due to noncompliance with diet, improving with IV Lasix, TED stockings, much improved symptoms, troponin trend was flat and a non-ACS pattern and was high likely due to demand ischemia from CHF.  --Her has borderline hypotension, sbp around 90's on 9/13 and she became bradycardic,  -renal function also declined, hence further diuretics were held, -currently she is only on low dose coreg, diuretics (lasix/spironolactone)on hold, hydralazine discontinued -still has lower extremity pitting edema , per cardiology, likely able to resume diuretics tomorrow, will get venous doppler to r/o DVT. -cardiology consulted, will follow recommendations  Chronic A. Fib - [place] vasc 2 score 4. On Eliquis which will be continued, coreg dose reduced as in #1 above, continue Eliquis, on amiodarone as well. Monitor heart rate closely. Cardiology on board, monitor heart rate.   HXof PVC's/VT - s/p ICD, monitor electrolytes closely, beta blocker and amiodarone as tolerated.   ARF on CKD stage 3 - baseline creatinine close to 1.5, has developed some acute renal failure, continue to hold ACE/ARB, changes made as in #1 above,  Cr peaked at 2.6 on 9/13 -renal function  has improved on 03/10/2017 after gentle IV fluids yesterday and cutting blood pressure medications.   AOCD  -  hgb 9 , close to baseline, stable no acute issues.   HLD - statins continue.   Right knee arthritis and pain. X-ray stable, uric acid stable, could be gout seen by orthopedic and placed on oral prednisone for 5 days from 9/11 to 9/15  patient refused arthrocentesis. Gradual improvement. PT Eval, will need outpatient ortho follow up.    Code Status: Full Family Communication: no family at bedside today. Disposition Plan: may be able to resume diuretics, will get physical therapy, may be discharge next 24-48 hours with cardiology clearance. DVT - prophylaxis with Eliquis  Consultants:  Cardiology   Ortho  Procedures:  2-D echo   - Left ventricle: LVEF is approximately 20 to 25% with akinesis of  the mid/distal inferior, mid/distal inferoseptal, distal lateral  and apical walls ; hypokinesis of the base/mid inferior, the  base/mid lateral and distal anterior walls. The cavity size was normal. Wall thickness was increased in a pattern of mild LVH.  Doppler parameters are consistent with abnormal left ventricular relaxation (grade 1 diastolic dysfunction). Doppler parameters are consistent with high ventricular filling pressure. - Mitral valve: There was mild regurgitation. - Pulmonary arteries: PA peak pressure: 44 mm Hg (S).  Antibiotics:  None    Objective: Vitals:   03/11/17 0900 03/11/17 1200  BP: (!) 135/44 (!) 145/61  Pulse:  (!) 57  Resp: 16 20  Temp:  (!) 97.5 F (36.4 C)  SpO2: 98% 98%    Intake/Output Summary (Last 24 hours) at 03/11/17 1344 Last data filed at 03/11/17 1300  Gross per  24 hour  Intake              655 ml  Output             1550 ml  Net             -895 ml   Filed Weights   03/09/17 0442 03/10/17 0952 03/11/17 0434  Weight: 66 kg (145 lb 8 oz) 66.9 kg (147 lb 8 oz) 67.2 kg (148 lb 1.6 oz)     Exam:  Awake Alert, Oriented X  3, No new F.N deficits, Normal affect [place].AT,PERRAL Supple Neck,No JVD, No cervical lymphadenopathy appriciated.  Symmetrical Chest wall movement, Good air movement bilaterally, +VE RALES RRR,No Gallops,Rubs or new Murmurs, No Parasternal Heave +ve B.Sounds, Abd Soft, No tenderness, No organomegaly appriciated, No rebound - guarding or rigidity. No Cyanosis, Clubbing or edema, No new Rash or bruise R.Knee swollen, with Moderate effusion and some tenderness, no warmth, left lower extremity pitting edema    Data Reviewed: Basic Metabolic Panel:  Recent Labs Lab 03/07/17 0649 03/08/17 0435 03/09/17 0427 03/10/17 0602 03/11/17 0621  NA 136 137 135 136 138  K 3.9 4.3 4.0 4.3 4.1  CL 105 107 104 107 110  CO2 25 24 25 25 25   GLUCOSE 111* 107* 87 88 84  BUN 23* 30* 48* 53* 54*  CREATININE 1.63* 1.84* 2.61* 2.08* 1.79*  CALCIUM 8.6* 8.6* 8.4* 8.5* 8.2*   Liver Function Tests:  Recent Labs Lab 03/06/17 1000  AST 69*  ALT 27  ALKPHOS 75  BILITOT 1.2  PROT 6.8  ALBUMIN 2.5*   CBC:  Recent Labs Lab 03/04/17 2040 03/05/17 0649 03/06/17 0344 03/07/17 0649 03/08/17 0435  WBC 6.2 6.2 6.1 5.9 8.9  HGB 9.6* 8.7* 9.2* 9.0* 9.0*  HCT 29.3* 26.2* 27.1* 27.7* 27.6*  MCV 75.9* 75.1* 74.7* 75.3* 75.0*  PLT 217 203 229 217 201   Cardiac Enzymes:  Recent Labs Lab 03/04/17 2040 03/05/17 0301 03/05/17 0649 03/05/17 1320  TROPONINI 0.13* 0.13* 0.12* 0.12*   BNP (last 3 results)  Recent Labs  03/16/16 1457 03/04/17 2040  BNP 367.0* 885.7*    Studies: No results found.  Scheduled Meds: . amiodarone  200 mg Oral Daily  . apixaban  2.5 mg Oral BID  . atorvastatin  40 mg Oral q1800  . carvedilol  3.125 mg Oral BID WC  . cholecalciferol  1,000 Units Oral BID   Continuous Infusions:   Principal Problem:   Acute on chronic combined systolic and diastolic CHF (congestive heart failure) (HCC) Active Problems:   Hypertension   Elevated troponin   PAF (paroxysmal  atrial fibrillation) ([place])   ICD (implantable cardioverter-defibrillator), dual, st Judes   HLD (hyperlipidemia)   CKD (chronic kidney disease), stage III   Chest pain   Microcytic anemia   Acute on chronic congestive heart failure (HCC)   Chronic anticoagulation    Time spent: 25 minutes  Signature  Aubreyanna Dorrough M.D PhDon 03/11/2017 at 1:44 PM  Between 7am to 7pm - Pager - [phone] ( page via [email or web address], text pages only, please mention full 10 digit call back number).  After 7pm go to [email or web address] - password [email or web address]

## 2017-03-12 ENCOUNTER — Inpatient Hospital Stay (HOSPITAL_COMMUNITY): Payer: Medicare Other

## 2017-03-12 DIAGNOSIS — R609 Edema, unspecified: Secondary | ICD-10-CM

## 2017-03-12 LAB — C-REACTIVE PROTEIN: CRP: <0.8 mg/dL (ref <1.0)

## 2017-03-12 LAB — COMPREHENSIVE METABOLIC PANEL
ALK PHOS: 110 U/L (ref 38–126)
ALT: 54 U/L (ref 14–54)
AST: 103 U/L — ABNORMAL HIGH (ref 15–41)
Albumin: 2.2 g/dL — ABNORMAL LOW (ref 3.5–5.0)
Anion gap: 3 — ABNORMAL LOW (ref 5–15)
BILIRUBIN TOTAL: 1.1 mg/dL (ref 0.3–1.2)
BUN: 47 mg/dL — AB (ref 6–20)
CO2: 24 mmol/L (ref 22–32)
CREATININE: 1.72 mg/dL — AB (ref 0.44–1.00)
Calcium: 8.3 mg/dL — ABNORMAL LOW (ref 8.9–10.3)
Chloride: 111 mmol/L (ref 101–111)
GFR, EST AFRICAN AMERICAN: 33 mL/min — AB (ref >60)
GFR, EST NON AFRICAN AMERICAN: 28 mL/min — AB (ref >60)
Glucose, Bld: 94 mg/dL (ref 65–99)
Potassium: 4.3 mmol/L (ref 3.5–5.1)
Sodium: 138 mmol/L (ref 135–145)
Total Protein: 6 g/dL — ABNORMAL LOW (ref 6.5–8.1)

## 2017-03-12 LAB — CBC
HEMATOCRIT: 26.9 % — AB (ref 36.0–46.0)
HEMOGLOBIN: 8.9 g/dL — AB (ref 12.0–15.0)
MCH: 24.8 pg — AB (ref 26.0–34.0)
MCHC: 33.1 g/dL (ref 30.0–36.0)
MCV: 74.9 fL — AB (ref 78.0–100.0)
PLATELETS: 249 10*3/uL (ref 150–400)
RBC: 3.59 MIL/uL — ABNORMAL LOW (ref 3.87–5.11)
RDW: 22 % — ABNORMAL HIGH (ref 11.5–15.5)
WBC: 11.2 10*3/uL — ABNORMAL HIGH (ref 4.0–10.5)

## 2017-03-12 LAB — SEDIMENTATION RATE: SED RATE: 22 mm/h (ref 0–22)

## 2017-03-12 MED ORDER — CARVEDILOL 6.25 MG PO TABS
6.2500 mg | ORAL_TABLET | Freq: Two times a day (BID) | ORAL | Status: DC
  Administered 2017-03-12 – 2017-03-13 (×2): 6.25 mg via ORAL
  Filled 2017-03-12 (×2): qty 1

## 2017-03-12 MED ORDER — FUROSEMIDE 40 MG PO TABS
40.0000 mg | ORAL_TABLET | Freq: Every day | ORAL | Status: DC
  Administered 2017-03-12 – 2017-03-13 (×2): 40 mg via ORAL
  Filled 2017-03-12 (×3): qty 1

## 2017-03-12 NOTE — Progress Notes (Signed)
*  PRELIMINARY RESULTS* Vascular Ultrasound Bilateral lower extremity venous duplex has been completed.  Preliminary findings: No evidence of deep vein thrombosis or baker's cysts bilaterally.  Significant atherosclerotic changes noted bilaterally. Small amount of free fluid noted in the left groin?   Stephanie Yates 03/12/2017, 3:31 PM

## 2017-03-12 NOTE — Evaluation (Signed)
Physical Therapy Evaluation Patient Details Name: Stephanie Yates MRN: [id number] DOB: 1945/08/08 Today's Date: 03/12/2017   History of Present Illness  Stephanie Yates is a 71 y.o. female with medical history significant of CHF with EF of 35-40 percent, hypertension, hyperlipidemia, sinus bradycardia, former smoker, ICD placement, atrial fibrillation on Eliquis, CKD-3, who presents with shortness of breath and chest pain. As well as intermittent R knee pain  Clinical Impression   Pt admitted with above diagnosis of combined CHF. Pt currently with functional limitations due to the deficits listed below (see PT Problem List). Overall moving well, occasional knee pain; we discussed RW versus cane as an aide in ambulation; at this point I favor the cane -- plan to work with cane next session; Pt will benefit from skilled PT to increase their independence and safety with mobility to allow discharge to the venue listed below.       Follow Up Recommendations Outpatient PT for R knee pain    Equipment Recommendations  Cane    Recommendations for Other Services       Precautions / Restrictions        Mobility  Bed Mobility                  Transfers Overall transfer level: Needs assistance Equipment used: None Transfers: Sit to/from Stand Sit to Stand: Min guard         General transfer comment: Overall good rise; minguard for safety  Ambulation/Gait Ambulation/Gait assistance: Min guard (without physical contact) Ambulation Distance (Feet): 100 Feet Assistive device: None Gait Pattern/deviations: Step-through pattern;Decreased step length - right;Decreased step length - left     General Gait Details: Cues to self-monitor for activity tolerance; occasional use of hallway rail; O2 sats reamined greater tahn or equal to 95% walking on Room Air  Stairs            Wheelchair Mobility    Modified Rankin (Stroke Patients Only)       Balance Overall balance  assessment: Needs assistance           Standing balance-Leahy Scale: Fair (approaching Good)                               Pertinent Vitals/Pain Pain Assessment: 0-10 Pain Score: 5  Pain Location: R knee, after walking Pain Descriptors / Indicators: Aching Pain Intervention(s): Monitored during session    Home Living Family/patient expects to be discharged to:: Private residence Living Arrangements: Alone Available Help at Discharge: Family;Available PRN/intermittently Type of Home: House Home Access: Stairs to enter   [employer] of Steps: 3 Home Layout: One level Home Equipment: None      Prior Function Level of Independence: Independent         Comments: 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, newspaper        Extremity/Trunk Assessment   Upper Extremity Assessment Upper Extremity Assessment: Overall WFL for tasks assessed    Lower Extremity Assessment Lower Extremity Assessment: Generalized weakness;RLE deficits/detail RLE Deficits / Details: knee ROM WFL; incr pain post walking; noted plan for Ortho to follow as outpt       Communication   Communication: No difficulties  Cognition Arousal/Alertness: Awake/alert Behavior During Therapy: WFL for tasks assessed/performed Overall Cognitive Status: Within Functional Limits for tasks assessed  General Comments General comments (skin integrity, edema, etc.): Bradycardic, but no symptomatic for dizziness    Exercises     Assessment/Plan    PT Assessment Patient needs continued PT services  PT Problem List Decreased strength;Decreased activity tolerance;Decreased balance;Decreased knowledge of use of DME;Decreased knowledge of precautions       PT Treatment Interventions DME instruction;Gait training;Stair training;Functional mobility training;Therapeutic activities;Therapeutic exercise;Balance  training;Patient/family education    PT Goals (Current goals can be found in the Care Plan section)  Acute Rehab PT Goals Patient Stated Goal: back to work PT Goal Formulation: With patient Time For Goal Achievement: 03/19/17 Potential to Achieve Goals: Good    Frequency Min 3X/week   Barriers to discharge        Co-evaluation               AM-PAC PT "6 Clicks" Daily Activity  Outcome Measure Difficulty turning over in bed (including adjusting bedclothes, sheets and blankets)?: None Difficulty moving from lying on back to sitting on the side of the bed? : None Difficulty sitting down on and standing up from a chair with arms (e.g., wheelchair, bedside commode, etc,.)?: A Little Help needed moving to and from a bed to chair (including a wheelchair)?: None Help needed walking in hospital room?: None Help needed climbing 3-5 steps with a railing? : A Little 6 Click Score: 22    End of Session Equipment Utilized During Treatment: Gait belt Activity Tolerance: Patient tolerated treatment well Patient left: in chair;with call bell/phone within reach Nurse Communication: Mobility status PT Visit Diagnosis: Other abnormalities of gait and mobility (R26.89);Pain Pain - Right/Left: Right Pain - part of body: Knee    Time: 1219-1233 PT Time Calculation (min) (ACUTE ONLY): 14 min   Charges:   PT Evaluation $PT Eval Low Complexity: 1 Low     PT G Codes:        Roney Marion, PT  Acute Rehabilitation Services Pager [phone] Office [phone]   Colletta [place] 03/12/2017, 4:02 PM

## 2017-03-12 NOTE — Progress Notes (Signed)
[facility] PROGRESS NOTE  Stephanie Yates SAY:[id number] DOB: 08/19/1945 DOA: 03/04/2017 PCP: Josetta Huddle, MD  Interim summary and HPI 71 y.o. female with medical history significant of CHF with EF of 35-40 percent, hypertension, hyperlipidemia, sinus bradycardia, former smoker, ICD placement, atrial fibrillation on Eliquis, CKD-3, who presents with shortness of breath and chest pain.   Subjective.   Patient report feeling better, sitting up in chair, , appears comfortable, denies any headache, no fever, no chest pain or pressure, no shortness of breath , no abdominal pain. No focal weakness. Right knee pain somewhat better.   Assessment/Plan:  Acute on chronic systolic heart failure with EF of 20-25% on echocardiogram. H/o NICM with VT s/p ICD -Reduced Lvef compare to that from 2014 -troponin trend was flat and a non-ACS pattern and was high likely due to demand ischemia from CHF.  -?Due to noncompliance with diet, improving with IV Lasix, TED stockings, much improved symptoms, --Her had borderline hypotension, sbp around 90's on 9/13 and she became bradycardic, hydralazine discontinued.  -renal function also declined, further diuretics were held on 9/13, 14 and 15 --still has lower extremity pitting edema , venous doppler negative for DVT. Already on apixaban -cardiology consulted, diuretics resumed on 9/16, coreg dose increased to 6.25bid on 9/16, will follow recommendations  Chronic A. Fib - [place] vasc 2 score 4. On Eliquis which will be continued, coreg dose adjusted, on amiodarone as well.   Cardiology on board, monitor heart rate.   HXof PVC's/VT - s/p ICD, monitor electrolytes closely, beta blocker and amiodarone as tolerated.   ARF on CKD stage 3 - baseline creatinine close to 1.5, has developed some acute renal failure, continue to hold ACE/ARB, changes made as in #1 above,  Cr peaked at 2.6 on 9/13 -renal function has improved.    AOCD  -  hgb 9, close to baseline,  stable no acute issues.   HLD - statins continue.   Right knee arthritis and pain. X-ray stable, uric acid stable, could be gout seen by orthopedic and placed on oral prednisone for 5 days from 9/11 to 9/15  patient refused arthrocentesis. Gradual improvement. PT Eval, will need outpatient ortho follow up.    Code Status: Full Family Communication: no family at bedside today. Disposition Plan:  Possible discharge on 9/17 with cardiology clearance. DVT - prophylaxis with Eliquis  Consultants:  Cardiology   Ortho  Procedures:  2-D echo   - Left ventricle: LVEF is approximately 20 to 25% with akinesis of  the mid/distal inferior, mid/distal inferoseptal, distal lateral  and apical walls ; hypokinesis of the base/mid inferior, the  base/mid lateral and distal anterior walls. The cavity size was normal. Wall thickness was increased in a pattern of mild LVH.  Doppler parameters are consistent with abnormal left ventricular relaxation (grade 1 diastolic dysfunction). Doppler parameters are consistent with high ventricular filling pressure. - Mitral valve: There was mild regurgitation. - Pulmonary arteries: PA peak pressure: 44 mm Hg (S).  Antibiotics:  None    Objective: Vitals:   03/11/17 1922 03/12/17 0549  BP: (!) 152/60 (!) 150/59  Pulse: (!) 55 (!) 55  Resp: 20 18  Temp: (!) 97.5 F (36.4 C) 98.2 F (36.8 C)  SpO2: 96% 100%    Intake/Output Summary (Last 24 hours) at 03/12/17 1307 Last data filed at 03/12/17 0900  Gross per 24 hour  Intake              840 ml  Output  1700 ml  Net             -860 ml   Filed Weights   03/10/17 0952 03/11/17 0434 03/12/17 0549  Weight: 66.9 kg (147 lb 8 oz) 67.2 kg (148 lb 1.6 oz) 67.7 kg (149 lb 4.8 oz)     Exam:  Awake Alert, Oriented X 3, No new F.N deficits, Normal affect [place].AT,PERRAL Supple Neck,No JVD, No cervical lymphadenopathy appriciated.  Symmetrical Chest wall movement, Good air movement bilaterally,  +VE RALES RRR,No Gallops,Rubs or new Murmurs, No Parasternal Heave +ve B.Sounds, Abd Soft, No tenderness, No organomegaly appriciated, No rebound - guarding or rigidity. No Cyanosis, Clubbing or edema, No new Rash or bruise R.Knee swollen, with Moderate effusion and some tenderness, no warmth, left lower extremity pitting edema    Data Reviewed: Basic Metabolic Panel:  Recent Labs Lab 03/08/17 0435 03/09/17 0427 03/10/17 0602 03/11/17 0621 03/12/17 0440  NA 137 135 136 138 138  K 4.3 4.0 4.3 4.1 4.3  CL 107 104 107 110 111  CO2 24 25 25 25 24   GLUCOSE 107* 87 88 84 94  BUN 30* 48* 53* 54* 47*  CREATININE 1.84* 2.61* 2.08* 1.79* 1.72*  CALCIUM 8.6* 8.4* 8.5* 8.2* 8.3*   Liver Function Tests:  Recent Labs Lab 03/06/17 1000 03/12/17 0440  AST 69* 103*  ALT 27 54  ALKPHOS 75 110  BILITOT 1.2 1.1  PROT 6.8 6.0*  ALBUMIN 2.5* 2.2*   CBC:  Recent Labs Lab 03/06/17 0344 03/07/17 0649 03/08/17 0435 03/12/17 0440  WBC 6.1 5.9 8.9 11.2*  HGB 9.2* 9.0* 9.0* 8.9*  HCT 27.1* 27.7* 27.6* 26.9*  MCV 74.7* 75.3* 75.0* 74.9*  PLT 229 217 201 249   Cardiac Enzymes:  Recent Labs Lab 03/05/17 1320  TROPONINI 0.12*   BNP (last 3 results)  Recent Labs  03/16/16 1457 03/04/17 2040  BNP 367.0* 885.7*    Studies: No results found.  Scheduled Meds: . amiodarone  200 mg Oral Daily  . apixaban  2.5 mg Oral BID  . atorvastatin  40 mg Oral q1800  . carvedilol  6.25 mg Oral BID WC  . cholecalciferol  1,000 Units Oral BID  . furosemide  40 mg Oral Daily   Continuous Infusions:   Principal Problem:   Acute on chronic combined systolic and diastolic CHF (congestive heart failure) (HCC) Active Problems:   Hypertension   Elevated troponin   PAF (paroxysmal atrial fibrillation) ([place])   ICD (implantable cardioverter-defibrillator), dual, [employer]   HLD (hyperlipidemia)   CKD (chronic kidney disease), stage III   Chest pain   Microcytic anemia   Acute on chronic  congestive heart failure (HCC)   Chronic anticoagulation    Time spent: 15 minutes  Signature  Aleshka Corney M.D PhDon 03/12/2017 at 1:07 PM  Between 7am to 7pm - Pager - [phone] ( page via [email or web address], text pages only, please mention full 10 digit call back number).  After 7pm go to [email or web address] - password [facility]

## 2017-03-12 NOTE — Progress Notes (Signed)
Progress Note  Patient Name: Stephanie Yates Date of Encounter: 03/12/2017  Primary Cardiologist:   Subjective   SOB continues to improve  Inpatient Medications    Scheduled Meds: . amiodarone  200 mg Oral Daily  . apixaban  2.5 mg Oral BID  . atorvastatin  40 mg Oral q1800  . carvedilol  3.125 mg Oral BID WC  . cholecalciferol  1,000 Units Oral BID   Continuous Infusions:  PRN Meds: acetaminophen, hydrOXYzine, morphine, nitroGLYCERIN, zolpidem   Vital Signs    Vitals:   03/11/17 0900 03/11/17 1200 03/11/17 1922 03/12/17 0549  BP: (!) 135/44 (!) 145/61 (!) 152/60 (!) 150/59  Pulse:  (!) 57 (!) 55 (!) 55  Resp: 16 20 20 18   Temp:  (!) 97.5 F (36.4 C) (!) 97.5 F (36.4 C) 98.2 F (36.8 C)  TempSrc:  Oral Oral Oral  SpO2: 98% 98% 96% 100%  Weight:    149 lb 4.8 oz (67.7 kg)  Height:        Intake/Output Summary (Last 24 hours) at 03/12/17 1243 Last data filed at 03/12/17 0900  Gross per 24 hour  Intake              960 ml  Output             2100 ml  Net            -1140 ml   Filed Weights   03/10/17 0952 03/11/17 0434 03/12/17 0549  Weight: 147 lb 8 oz (66.9 kg) 148 lb 1.6 oz (67.2 kg) 149 lb 4.8 oz (67.7 kg)    Telemetry    SR, a-pacing - Personally Reviewed  ECG     Physical Exam   GEN: No acute distress.   Neck: No JVD Cardiac: RRR, no murmurs, rubs, or gallops.  Respiratory: Clear to auscultation bilaterally. GI: Soft, nontender, non-distended  MS: trace bilaterla edema; No deformity. Neuro:  Nonfocal  Psych: Normal affect   Labs    Chemistry Recent Labs Lab 03/06/17 1000  03/10/17 0602 03/11/17 0621 03/12/17 0440  NA  --   < > 136 138 138  K  --   < > 4.3 4.1 4.3  CL  --   < > 107 110 111  CO2  --   < > 25 25 24   GLUCOSE  --   < > 88 84 94  BUN  --   < > 53* 54* 47*  CREATININE  --   < > 2.08* 1.79* 1.72*  CALCIUM  --   < > 8.5* 8.2* 8.3*  PROT 6.8  --   --   --  6.0*  ALBUMIN 2.5*  --   --   --  2.2*  AST 69*  --   --    --  103*  ALT 27  --   --   --  54  ALKPHOS 75  --   --   --  110  BILITOT 1.2  --   --   --  1.1  GFRNONAA  --   < > 22* 27* 28*  GFRAA  --   < > 26* 31* 33*  ANIONGAP  --   < > 4* 3* 3*  < > = values in this interval not displayed.   Hematology Recent Labs Lab 03/07/17 0649 03/08/17 0435 03/12/17 0440  WBC 5.9 8.9 11.2*  RBC 3.68* 3.68* 3.59*  HGB 9.0* 9.0* 8.9*  HCT 27.7* 27.6* 26.9*  MCV 75.3* 75.0* 74.9*  MCH 24.5* 24.5* 24.8*  MCHC 32.5 32.6 33.1  RDW 21.7* 21.8* 22.0*  PLT 217 201 249    Cardiac Enzymes Recent Labs Lab 03/05/17 1320  TROPONINI 0.12*   No results for input(s): TROPIPOC in the last 168 hours.   BNPNo results for input(s): BNP, PROBNP in the last 168 hours.   DDimer No results for input(s): DDIMER in the last 168 hours.   Radiology    No results found.  Cardiac Studies     Patient Profile     71 y.o.femalewith chronic systolic CHF/NICM s/p [employer] ICD (dx 2014 when she presented with ventricular tachycardia, EF 10-15% in 2014 with nonobstructive CAD, improved to 35-40% in 03/2013), VT/PVCs (placed on amiodarone in 2014), paroxysmal atrial fib, sinus bradycardia, tobacco abuse, CKD III, HTN, HLD, prior abnormal LFTs, anemia presented to [facility] with progressive SOB, chest pressure, edema in setting of running out of medicines for 1 week. Scale broken at home.  Assessment & Plan    1. Acute on chronic combined CHF - currently appears euvolemic, diuretics on hold due to hypotension. WOuld restart lasix 40mg  daily when bp's normalize - meds decreased and held yesterday due to low bp's, currently just on coreg 3.125mg  bid. BP's have improved - we wil lstart lasix 40mg  daily. With normalized bp's titrate coreg up to 6.25mg  bid  - would monitor bp's and symptoms overnight, possible d/c tomorrow  2. PVCs/VT - on amiodarone, beta blocker  3. PAF - on beta blocker, amio, eliquis  4. Troponin elevation - fairly mild and  flat in initialy setting of CHF, from prior notes no plans for ischemic testing, suspected demand ischemia.  - high theshold for cath due to poor renal function. She did have a drop in her LVEF, cath may need to be reconsidered pending her clinical course as outpatient.    For questions or updates, please contact [place] Please consult [email or web address] for contact info under Cardiology/STEMI.      Merrily Pew, MD  03/12/2017, 12:43 PM

## 2017-03-13 DIAGNOSIS — M25569 Pain in unspecified knee: Secondary | ICD-10-CM

## 2017-03-13 DIAGNOSIS — N179 Acute kidney failure, unspecified: Secondary | ICD-10-CM

## 2017-03-13 DIAGNOSIS — N189 Chronic kidney disease, unspecified: Secondary | ICD-10-CM

## 2017-03-13 LAB — COMPREHENSIVE METABOLIC PANEL
ALK PHOS: 95 U/L (ref 38–126)
ALT: 70 U/L — ABNORMAL HIGH (ref 14–54)
ANION GAP: 4 — AB (ref 5–15)
AST: 129 U/L — ABNORMAL HIGH (ref 15–41)
Albumin: 2.2 g/dL — ABNORMAL LOW (ref 3.5–5.0)
BUN: 41 mg/dL — ABNORMAL HIGH (ref 6–20)
CHLORIDE: 110 mmol/L (ref 101–111)
CO2: 24 mmol/L (ref 22–32)
Calcium: 8.2 mg/dL — ABNORMAL LOW (ref 8.9–10.3)
Creatinine, Ser: 1.9 mg/dL — ABNORMAL HIGH (ref 0.44–1.00)
GFR calc non Af Amer: 25 mL/min — ABNORMAL LOW (ref >60)
GFR, EST AFRICAN AMERICAN: 29 mL/min — AB (ref >60)
Glucose, Bld: 85 mg/dL (ref 65–99)
POTASSIUM: 4.3 mmol/L (ref 3.5–5.1)
SODIUM: 138 mmol/L (ref 135–145)
Total Bilirubin: 1.2 mg/dL (ref 0.3–1.2)
Total Protein: 5.8 g/dL — ABNORMAL LOW (ref 6.5–8.1)

## 2017-03-13 LAB — CBC
HCT: 27.2 % — ABNORMAL LOW (ref 36.0–46.0)
Hemoglobin: 8.9 g/dL — ABNORMAL LOW (ref 12.0–15.0)
MCH: 24.8 pg — ABNORMAL LOW (ref 26.0–34.0)
MCHC: 32.7 g/dL (ref 30.0–36.0)
MCV: 75.8 fL — ABNORMAL LOW (ref 78.0–100.0)
PLATELETS: 256 10*3/uL (ref 150–400)
RBC: 3.59 MIL/uL — ABNORMAL LOW (ref 3.87–5.11)
RDW: 21.8 % — AB (ref 11.5–15.5)
WBC: 8.6 10*3/uL (ref 4.0–10.5)

## 2017-03-13 MED ORDER — FUROSEMIDE 40 MG PO TABS: 40.0000 mg | ORAL_TABLET | Freq: Every day | ORAL | 0 refills | Status: DC

## 2017-03-13 MED ORDER — ATORVASTATIN CALCIUM 40 MG PO TABS: 40.0000 mg | ORAL_TABLET | Freq: Every day | ORAL | 0 refills | Status: DC

## 2017-03-13 MED ORDER — APIXABAN 2.5 MG PO TABS: 2.5000 mg | ORAL_TABLET | Freq: Two times a day (BID) | ORAL | 0 refills | Status: DC

## 2017-03-13 MED ORDER — CARVEDILOL 6.25 MG PO TABS: 6.2500 mg | ORAL_TABLET | Freq: Two times a day (BID) | ORAL | 0 refills | Status: DC

## 2017-03-13 NOTE — Progress Notes (Signed)
DAILY PROGRESS NOTE   Patient Name: Stephanie Yates Date of Encounter: 03/13/2017  Hospital Problem List   Principal Problem:   Acute on chronic combined systolic and diastolic CHF (congestive heart failure) (HCC) Active Problems:   Hypertension   Elevated troponin   PAF (paroxysmal atrial fibrillation) ([place])   ICD (implantable cardioverter-defibrillator), dual, [employer]   HLD (hyperlipidemia)   CKD (chronic kidney disease), stage III   Chest pain   Microcytic anemia   Acute on chronic congestive heart failure (HCC)   Chronic anticoagulation    Chief Complaint   Breathing is better  Subjective  Lasix 40 mg daily (home dose) restarted yesterday. Diuresed another 1.8L negative. BP improved today. Total 9.3L negative. Creatinine up to 1.9 (however, GFR essentially unchanged).   Objective   Vitals:   03/12/17 2042 03/13/17 0550 03/13/17 1000 03/13/17 1235  BP: (!) 117/45 (!) 117/42 (!) 129/51 (!) 136/48  Pulse: 60 (!) 55 (!) 59 (!) 50  Resp: 18 18  18   Temp: 98.3 F (36.8 C) 100.2 F (37.9 C)    TempSrc: Oral Oral  Oral  SpO2: 98% 95%  100%  Weight:  148 lb (67.1 kg)    Height:        Intake/Output Summary (Last 24 hours) at 03/13/17 1334 Last data filed at 03/13/17 1443  Gross per 24 hour  Intake              720 ml  Output             2100 ml  Net            -1380 ml   Filed Weights   03/11/17 0434 03/12/17 0549 03/13/17 0550  Weight: 148 lb 1.6 oz (67.2 kg) 149 lb 4.8 oz (67.7 kg) 148 lb (67.1 kg)    Physical Exam   General appearance: alert and no distress Neck: no carotid bruit, no JVD and thyroid not enlarged, symmetric, no tenderness/mass/nodules Lungs: diminished breath sounds bibasilar Heart: regular rate and rhythm Abdomen: soft, non-tender; bowel sounds normal; no masses,  no organomegaly Extremities: extremities normal, atraumatic, no cyanosis or edema Pulses: 2+ and symmetric Skin: Skin color, texture, turgor normal. No rashes or  lesions Neurologic: Grossly normal Psych: Quiet, pleasant  Inpatient Medications    Scheduled Meds: . amiodarone  200 mg Oral Daily  . apixaban  2.5 mg Oral BID  . atorvastatin  40 mg Oral q1800  . carvedilol  6.25 mg Oral BID WC  . cholecalciferol  1,000 Units Oral BID  . furosemide  40 mg Oral Daily    Continuous Infusions:   PRN Meds: acetaminophen, hydrOXYzine, morphine, nitroGLYCERIN, zolpidem   Labs   Results for orders placed or performed during the hospital encounter of 03/04/17 (from the past 48 hour(s))  CBC     Status: Abnormal   Collection Time: 03/12/17  4:40 AM  Result Value Ref Range   WBC 11.2 (H) 4.0 - 10.5 K/uL   RBC 3.59 (L) 3.87 - 5.11 MIL/uL   Hemoglobin 8.9 (L) 12.0 - 15.0 g/dL   HCT 26.9 (L) 36.0 - 46.0 %   MCV 74.9 (L) 78.0 - 100.0 fL   MCH 24.8 (L) 26.0 - 34.0 pg   MCHC 33.1 30.0 - 36.0 g/dL   RDW 22.0 (H) 11.5 - 15.5 %   Platelets 249 150 - 400 K/uL  Comprehensive metabolic panel     Status: Abnormal   Collection Time: 03/12/17  4:40 AM  Result Value Ref Range   Sodium 138 135 - 145 mmol/L   Potassium 4.3 3.5 - 5.1 mmol/L   Chloride 111 101 - 111 mmol/L   CO2 24 22 - 32 mmol/L   Glucose, Bld 94 65 - 99 mg/dL   BUN 47 (H) 6 - 20 mg/dL   Creatinine, Ser 1.72 (H) 0.44 - 1.00 mg/dL   Calcium 8.3 (L) 8.9 - 10.3 mg/dL   Total Protein 6.0 (L) 6.5 - 8.1 g/dL   Albumin 2.2 (L) 3.5 - 5.0 g/dL   AST 103 (H) 15 - 41 U/L   ALT 54 14 - 54 U/L   Alkaline Phosphatase 110 38 - 126 U/L   Total Bilirubin 1.1 0.3 - 1.2 mg/dL   GFR calc non Af Amer 28 (L) >60 mL/min   GFR calc Af Amer 33 (L) >60 mL/min    Comment: (NOTE) The eGFR has been calculated using the CKD EPI equation. This calculation has not been validated in all clinical situations. eGFR's persistently <60 mL/min signify possible Chronic Kidney Disease.    Anion gap 3 (L) 5 - 15  Sedimentation rate     Status: None   Collection Time: 03/12/17  4:40 AM  Result Value Ref Range   Sed Rate  22 0 - 22 mm/hr  C-reactive protein     Status: None   Collection Time: 03/12/17  4:40 AM  Result Value Ref Range   CRP <0.8 <1.0 mg/dL  CBC     Status: Abnormal   Collection Time: 03/13/17  4:10 AM  Result Value Ref Range   WBC 8.6 4.0 - 10.5 K/uL   RBC 3.59 (L) 3.87 - 5.11 MIL/uL   Hemoglobin 8.9 (L) 12.0 - 15.0 g/dL   HCT 27.2 (L) 36.0 - 46.0 %   MCV 75.8 (L) 78.0 - 100.0 fL   MCH 24.8 (L) 26.0 - 34.0 pg   MCHC 32.7 30.0 - 36.0 g/dL   RDW 21.8 (H) 11.5 - 15.5 %   Platelets 256 150 - 400 K/uL  Comprehensive metabolic panel     Status: Abnormal   Collection Time: 03/13/17  4:10 AM  Result Value Ref Range   Sodium 138 135 - 145 mmol/L   Potassium 4.3 3.5 - 5.1 mmol/L   Chloride 110 101 - 111 mmol/L   CO2 24 22 - 32 mmol/L   Glucose, Bld 85 65 - 99 mg/dL   BUN 41 (H) 6 - 20 mg/dL   Creatinine, Ser 1.90 (H) 0.44 - 1.00 mg/dL   Calcium 8.2 (L) 8.9 - 10.3 mg/dL   Total Protein 5.8 (L) 6.5 - 8.1 g/dL   Albumin 2.2 (L) 3.5 - 5.0 g/dL   AST 129 (H) 15 - 41 U/L   ALT 70 (H) 14 - 54 U/L   Alkaline Phosphatase 95 38 - 126 U/L   Total Bilirubin 1.2 0.3 - 1.2 mg/dL   GFR calc non Af Amer 25 (L) >60 mL/min   GFR calc Af Amer 29 (L) >60 mL/min    Comment: (NOTE) The eGFR has been calculated using the CKD EPI equation. This calculation has not been validated in all clinical situations. eGFR's persistently <60 mL/min signify possible Chronic Kidney Disease.    Anion gap 4 (L) 5 - 15    ECG   N/A  Telemetry   Sinus rhythm - Personally Reviewed  Radiology    No results found.  Cardiac Studies   Indications:      CHF -  428.0.  ------------------------------------------------------------------- History:   PMH:   Atrial fibrillation.  Congestive heart failure. Cardiomyopathy of unknown etiology.  Risk factors:  Hypertension. Dyslipidemia.  ------------------------------------------------------------------- Study Conclusions  - Left ventricle: LVEF is approximately  20 to 25% with akinesis of   the mid/distal inferior, mid/distal inferoseptal, distal lateral   and apical walls ; hypokinesis of the base/mid inferior, the   base/mid lateral and distal anterior walls. The cavity size was   normal. Wall thickness was increased in a pattern of mild LVH.   Doppler parameters are consistent with abnormal left ventricular   relaxation (grade 1 diastolic dysfunction). Doppler parameters   are consistent with high ventricular filling pressure. - Mitral valve: There was mild regurgitation. - Pulmonary arteries: PA peak pressure: 44 mm Hg (S).  Assessment   1. Principal Problem: 2.   Acute on chronic combined systolic and diastolic CHF (congestive heart failure) ([place]) 3. Active Problems: 4.   Hypertension 5.   Elevated troponin 6.   PAF (paroxysmal atrial fibrillation) ([place]) 7.   ICD (implantable cardioverter-defibrillator), dual, [employer] 8.   HLD (hyperlipidemia) 9.   CKD (chronic kidney disease), stage III 10.   Chest pain 11.   Microcytic anemia 12.   Acute on chronic congestive heart failure ([place]) 13.   Chronic anticoagulation 14.   Plan   1. Breathing has improved with >9L of diuresis. LVEF declined to 20-25% (from 35-40%). Troponin flat elevated this admission, more suggestive of acute systolic CHF. No chest pain complaints - could consider outpatient ischemia evaluation, however, given her low GFR - cath is not likely an option. Would maximize medical therapy. ACE-I/ARB/ARNI/aldactone are relatively contraindicated given her CKD. Consider adding BiDil if BP allows as an outpatient.   Ok to d/c home today from our standpoint. Will arrange follow-up with Dr. Caryl Comes or APP in the office.  Time Spent Directly with Patient:  I have spent a total of 15 minutes with the patient reviewing hospital notes, telemetry, EKGs, labs and examining the patient as well as establishing an assessment and plan that was discussed personally with the patient. > 50% of  time was spent in direct patient care.  Length of Stay:  LOS: 8 days   Pixie Casino, MD, [place]  Attending Cardiologist  Direct Dial: [phone]  Fax: [phone]  Website:  [email or web address] 03/13/2017, 1:34 PM

## 2017-03-13 NOTE — Progress Notes (Signed)
Discharge to home, alert and oriented, no complaints of any discomfort. D/C instructions and follow up appointments discussed with patient, verbalized understanding.PIV removed no signs of swelling noted. To be transported to home by daughter.

## 2017-03-13 NOTE — Progress Notes (Signed)
Patient is for discharge home today; she is already on Eliquis at home as prior to admission; she works full time in a school [employer] and does not want any Outpatient PT at this time; CM will continue to follow for QSX:[id number] RN,MHA,BSN [phone]

## 2017-03-13 NOTE — Discharge Summary (Signed)
Physician Discharge Summary  TAMEYAH KOCH KKX:[id number] DOB: [birth year]  PCP: Josetta Huddle, MD  Admit date: 03/04/2017 Discharge date: 03/13/2017  Recommendations for Outpatient Follow-up:  1. Dr. Josetta Huddle, PCP in 4 days with repeat labs (CBC & BMP). 2. Dr. Virl Axe, Cardiology: Office will arrange outpatient follow-up. 3. Dr. Eduard Roux, Orthopedics.  Home Health: Outpatient PT Equipment/Devices: Cane  Discharge Condition: Improved and stable  CODE STATUS: Full  Diet recommendation: Heart healthy diet.  Discharge Diagnoses:  Principal Problem:   Acute on chronic combined systolic and diastolic CHF (congestive heart failure) (HCC) Active Problems:   Hypertension   Elevated troponin   PAF (paroxysmal atrial fibrillation) (HCC)   ICD (implantable cardioverter-defibrillator), dual, [facility]   HLD (hyperlipidemia)   CKD (chronic kidney disease), stage III   Chest pain   Microcytic anemia   Acute on chronic congestive heart failure (HCC)   Chronic anticoagulation   Brief Summary: 71 year old female, works with [employer], lives alone and independent, PMH of chronic systolic CHF with EF of 16-96 percent, nonischemic cardiomyopathy, AICD, HTN, HLD, stage III chronic kidney disease, A. fib on Eliquis, noncompliance with medications, presented to ED on 03/05/17 with complaints of dyspnea and chest pain after being out of her medications for 1 week. In the ED, troponin 0.13, BNP 885.7, not hypoxic, chest x-ray consistent with pulmonary edema. Admitted for decompensated CHF. Cardiology was consulted.  Assessment and plan:  1. Acute on chronic combined systolic and diastolic CHF/nonischemic cardiomyopathy: On admission, patient was dyspneic, BNP 885, chest x-ray showed pulmonary edema. She had run out of her medications for approximately a week prior to admission. She was admitted to telemetry. Treated with IV Lasix 40 mg twice a day. She was continued on reduced dose  of carvedilol. Repeat echo 03/06/17 showed LVEF 20-25 percent with akinesis of mid/distal inferior, mid/distal inferoseptal, distal lateral and apical walls; hypokinesis of the basal/mid inferior, the basal/mid lateral and distal anterior walls and grade 1 diastolic dysfunction. She is -8.846 L since admission. Cardiology consultation and follow-up appreciated. They indicated that LVEF has declined from 35-40 percent to 20-25 percent, troponin elevation with flat trend, more suggestive of acute systolic CHF. No current chest pain symptoms and could consider outpatient ischemia evaluation, however given her low GFR, Cardiac cath is likely not an option. Hence maximize medical treatment. ACEI/ARB/ARNI/Aldactone are relatively contraindicated given her chronic kidney disease (Aldactone discontinued). May consider BiDil if BP allows as an outpatient. Clinically improved and euvolemic today. I have discussed with the Cardiologist today and they have cleared her for discharge home and will arrange outpatient follow-up. 2. Chest pain/elevated troponin: Likely due to decompensated CHF and demand ischemia. May consider outpatient ischemic evaluation as discussed in problem #1. Lower extremity venous Dopplers negative for DVT. 3. Essential hypertension: Controlled on current regimen as below. 4. Paroxysmal Atrial fibrillation: CHA2DS2-VASc Score is 5. Continue amiodarone, reduced dose carvedilol and Eliquis. Currently in sinus bradycardia in the 50s. 5. Hyperlipidemia: Statin started during this hospitalization. 6. Acute on Stage III chronic kidney disease: Baseline creatinine possibly in the 1.6 range. Due to her aggressive IV diuresis, creatinine had peaked to 2.61. Diuretics were recently held and creatinine gradually improved to 1.7. Home dose of diuretics were resumed. Creatinine has slightly gone up to 1.9. Close follow-up with repeat BMP as outpatient. Aldactone discontinued. 7. Microcytic anemia: Stable. Anemia  panel suggests iron deficiency and recommend outpatient evaluation and follow-up as deemed necessary. 8. VT Status post AICD: 9. Medical noncompliance:  Counseled extensively regarding importance of compliance with all aspects of medical care. 10. Right knee arthritis: X-ray and uric acid stable. Could have been due to gout. Seen by orthopedics and placed on oral prednisone for 5 days from 9/11-9/15. Patient declined arthrocentesis. Resolved. PT evaluation recommends outpatient PT for right knee pain. Outpatient follow-up with Dr.Xu, orthopedics 11. Mild transaminitis: Likely from passive hepatic congestion related to CHF. No GI symptoms reported. Follow CMP as outpatient.   Consultations:  Cardiology  Orthopedics   Procedures:  None   Discharge Instructions  Discharge Instructions    (HEART FAILURE PATIENTS) Call MD:  Anytime you have any of the following symptoms: 1) 3 pound weight gain in 24 hours or 5 pounds in 1 week 2) shortness of breath, with or without a dry hacking cough 3) swelling in the hands, feet or stomach 4) if you have to sleep on extra pillows at night in order to breathe.    Complete by:  As directed    Call MD for:  difficulty breathing, headache or visual disturbances    Complete by:  As directed    Call MD for:  extreme fatigue    Complete by:  As directed    Call MD for:  persistant dizziness or light-headedness    Complete by:  As directed    Diet - low sodium heart healthy    Complete by:  As directed    Increase activity slowly    Complete by:  As directed        Medication List    STOP taking these medications   spironolactone 25 MG tablet Commonly known as:  ALDACTONE     TAKE these medications   amiodarone 200 MG tablet Commonly known as:  PACERONE Take 1 tablet (200 mg total) by mouth daily.   apixaban 2.5 MG Tabs tablet Commonly known as:  ELIQUIS Take 1 tablet (2.5 mg total) by mouth 2 (two) times daily. What changed:  See the new  instructions.   atorvastatin 40 MG tablet Commonly known as:  LIPITOR Take 1 tablet (40 mg total) by mouth daily at 6 PM.   carvedilol 6.25 MG tablet Commonly known as:  COREG Take 1 tablet (6.25 mg total) by mouth 2 (two) times daily with a meal. What changed:  medication strength  how much to take  when to take this   furosemide 40 MG tablet Commonly known as:  LASIX Take 1 tablet (40 mg total) by mouth daily.   VITAMIN D-3 PO Take 1,000 Units by mouth 2 (two) times daily.      Follow-up Information    Josetta Huddle, MD. Schedule an appointment as soon as possible for a visit in 4 day(s).   Specialty:  Internal Medicine Why:  To be seen with repeat labs (CBC & BMP). Contact information: [address] [place] [address] [phone]        Deboraha Sprang, MD Follow up.   Specialty:  Cardiology Why:  Office will arrange outpatient follow-up. Contact information: [address] [name] [address] [phone]          Allergies  Allergen Reactions  . Strawberry Extract Hives, Itching and Other (See Comments)    Hives and itching       Procedures/Studies: Dg Chest 2 View  Result Date: 03/04/2017 CLINICAL DATA:  Chest pain EXAM: CHEST  2 VIEW COMPARISON:  09/30/2013 FINDINGS: Left-sided duo lead pacing device as before. Small pleural  effusions. Cardiomegaly with central vascular congestion and diffuse interstitial opacity consistent with mild edema. Aortic atherosclerosis. No pneumothorax. IMPRESSION: Cardiomegaly with tiny pleural effusions, vascular congestion and mild diffuse pulmonary edema Electronically Signed   By: Donavan Foil M.D.   On: 03/04/2017 21:30   Dg Knee Complete 4 Views Right  Result Date: 03/08/2017 CLINICAL DATA:  No inj,had chronic pain rt knee for yrs ,but today started having pain and swelling all over rt knee EXAM: RIGHT KNEE - COMPLETE 4+ VIEW COMPARISON:  CT 07/12/2016 FINDINGS: No  evidence of fracture, dislocation. Small suprapatellar bursa effusion. Marginal spurring and Subchondral irregularity involving patella and anterior femoral condyles. Extensive femoral-popliteal arterial calcifications. IMPRESSION: 1. Negative for fracture or other acute findings. 2. Advanced   femoral-popliteal DJD with effusion. Electronically Signed   By: Lucrezia Europe M.D.   On: 03/08/2017 11:30      Subjective: Seen this morning. Feels much better compared to admission. No chest pain. Ambulating in the room without dizziness, lightheadedness, palpitations or chest pain. Dyspnea significantly improved but reports mild intermittent dyspnea on exertion.  Discharge Exam:  Vitals:   03/12/17 2042 03/13/17 0550 03/13/17 1000 03/13/17 1235  BP: (!) 117/45 (!) 117/42 (!) 129/51 (!) 136/48  Pulse: 60 (!) 55 (!) 59 (!) 50  Resp: 18 18  18   Temp: 98.3 F (36.8 C) 100.2 F (37.9 C)    TempSrc: Oral Oral  Oral  SpO2: 98% 95%  100%  Weight:  67.1 kg (148 lb)    Height:        General: Pleasant elderly female, moderately built and nourished, sitting up comfortably in bed. Cardiovascular: S1 & S2 heard, RRR, S1/S2 +. No murmurs, rubs, gallops or clicks. No JVD or pedal edema. Telemetry: Sinus bradycardia in the 50s and on demand atrial pacing at times. Respiratory: Occasional basal crackles but otherwise clear to auscultation. No increased work of breathing. Abdominal:  Non distended, non tender & soft. No organomegaly or masses appreciated. Normal bowel sounds heard. CNS: Alert and oriented. No focal deficits. Extremities: no edema, no cyanosis    The results of significant diagnostics from this hospitalization (including imaging, microbiology, ancillary and laboratory) are listed below for reference.     Labs: CBC:  Recent Labs Lab 03/07/17 0649 03/08/17 0435 03/12/17 0440 03/13/17 0410  WBC 5.9 8.9 11.2* 8.6  HGB 9.0* 9.0* 8.9* 8.9*  HCT 27.7* 27.6* 26.9* 27.2*  MCV 75.3* 75.0*  74.9* 75.8*  PLT 217 201 249 009   Basic Metabolic Panel:  Recent Labs Lab 03/09/17 0427 03/10/17 0602 03/11/17 0621 03/12/17 0440 03/13/17 0410  NA 135 136 138 138 138  K 4.0 4.3 4.1 4.3 4.3  CL 104 107 110 111 110  CO2 25 25 25 24 24   GLUCOSE 87 88 84 94 85  BUN 48* 53* 54* 47* 41*  CREATININE 2.61* 2.08* 1.79* 1.72* 1.90*  CALCIUM 8.4* 8.5* 8.2* 8.3* 8.2*   Liver Function Tests:  Recent Labs Lab 03/12/17 0440 03/13/17 0410  AST 103* 129*  ALT 54 70*  ALKPHOS 110 95  BILITOT 1.1 1.2  PROT 6.0* 5.8*  ALBUMIN 2.2* 2.2*   BNP (last 3 results)  Recent Labs  03/16/16 1457 03/04/17 2040  BNP 367.0* 885.7*   Discussed in detail with patient's daughter at bedside. Updated care and answered questions.    Time coordinating discharge: Over 30 minutes  SIGNED:  Vernell Leep, MD, FACP, [place]. [facility] Pager [phone]  If 7PM-7AM, please contact night-coverage [email or web address]  Password [email or web address] 03/13/2017, 2:36 PM

## 2017-03-22 ENCOUNTER — Telehealth: Payer: Self-pay | Admitting: Physician Assistant

## 2017-03-22 NOTE — Telephone Encounter (Signed)
New message   Pt rt shoulder up to her neck is causing her pain and she wants provider to write her something for pain

## 2017-03-22 NOTE — Telephone Encounter (Signed)
I spoke with pt who reports she has shoulder pain which has been constant since yesterday.  Hurts when she turns her head.  States she has taken hydrocodone and morphine in the past for this.  She is not having any other problems.  I asked her to contact primary care or go to Urgent Care as we do not write prescriptions for pain medications.  She is asking what she could take over the counter and I told her she could take tylenol.

## 2017-04-05 ENCOUNTER — Ambulatory Visit
Admission: RE | Admit: 2017-04-05 | Discharge: 2017-04-05 | Disposition: A | Discharge status: Home / Self Care | Payer: Medicare Other | Source: Ambulatory Visit | Attending: Internal Medicine | Admitting: Internal Medicine

## 2017-04-05 DIAGNOSIS — R1901 Right upper quadrant abdominal swelling, mass and lump: Secondary | ICD-10-CM

## 2017-04-05 DIAGNOSIS — N289 Disorder of kidney and ureter, unspecified: Secondary | ICD-10-CM

## 2017-04-10 ENCOUNTER — Other Ambulatory Visit: Payer: Self-pay | Admitting: Internal Medicine

## 2017-04-10 DIAGNOSIS — R1900 Intra-abdominal and pelvic swelling, mass and lump, unspecified site: Secondary | ICD-10-CM

## 2017-04-19 ENCOUNTER — Other Ambulatory Visit (HOSPITAL_COMMUNITY): Payer: Self-pay | Admitting: Internal Medicine

## 2017-04-19 DIAGNOSIS — R1900 Intra-abdominal and pelvic swelling, mass and lump, unspecified site: Secondary | ICD-10-CM

## 2017-05-23 ENCOUNTER — Encounter: Payer: Self-pay | Admitting: Internal Medicine

## 2017-06-14 ENCOUNTER — Other Ambulatory Visit (HOSPITAL_COMMUNITY): Payer: Self-pay | Admitting: Internal Medicine

## 2017-06-14 DIAGNOSIS — R1907 Generalized intra-abdominal and pelvic swelling, mass and lump: Secondary | ICD-10-CM

## 2017-06-15 ENCOUNTER — Telehealth: Payer: Self-pay

## 2017-06-15 ENCOUNTER — Encounter (HOSPITAL_COMMUNITY): Payer: Self-pay | Admitting: *Deleted

## 2017-06-15 ENCOUNTER — Other Ambulatory Visit: Payer: Self-pay

## 2017-06-15 ENCOUNTER — Observation Stay (HOSPITAL_COMMUNITY)
Admission: EM | Admit: 2017-06-15 | Discharge: 2017-06-18 | Disposition: A | Discharge status: Home / Self Care | Payer: Medicare Other | Attending: Internal Medicine | Admitting: Internal Medicine

## 2017-06-15 DIAGNOSIS — I5022 Chronic systolic (congestive) heart failure: Secondary | ICD-10-CM | POA: Insufficient documentation

## 2017-06-15 DIAGNOSIS — D649 Anemia, unspecified: Secondary | ICD-10-CM | POA: Diagnosis not present

## 2017-06-15 DIAGNOSIS — K7460 Unspecified cirrhosis of liver: Secondary | ICD-10-CM | POA: Diagnosis not present

## 2017-06-15 DIAGNOSIS — K921 Melena: Secondary | ICD-10-CM | POA: Diagnosis not present

## 2017-06-15 DIAGNOSIS — Z7901 Long term (current) use of anticoagulants: Secondary | ICD-10-CM | POA: Diagnosis not present

## 2017-06-15 DIAGNOSIS — Z87891 Personal history of nicotine dependence: Secondary | ICD-10-CM | POA: Diagnosis not present

## 2017-06-15 DIAGNOSIS — I130 Hypertensive heart and chronic kidney disease with heart failure and stage 1 through stage 4 chronic kidney disease, or unspecified chronic kidney disease: Secondary | ICD-10-CM | POA: Insufficient documentation

## 2017-06-15 DIAGNOSIS — N183 Chronic kidney disease, stage 3 unspecified: Secondary | ICD-10-CM | POA: Diagnosis present

## 2017-06-15 DIAGNOSIS — E785 Hyperlipidemia, unspecified: Secondary | ICD-10-CM | POA: Insufficient documentation

## 2017-06-15 DIAGNOSIS — K922 Gastrointestinal hemorrhage, unspecified: Secondary | ICD-10-CM | POA: Diagnosis not present

## 2017-06-15 DIAGNOSIS — K317 Polyp of stomach and duodenum: Secondary | ICD-10-CM | POA: Insufficient documentation

## 2017-06-15 DIAGNOSIS — K259 Gastric ulcer, unspecified as acute or chronic, without hemorrhage or perforation: Secondary | ICD-10-CM | POA: Insufficient documentation

## 2017-06-15 DIAGNOSIS — Z79899 Other long term (current) drug therapy: Secondary | ICD-10-CM | POA: Insufficient documentation

## 2017-06-15 DIAGNOSIS — I480 Paroxysmal atrial fibrillation: Secondary | ICD-10-CM | POA: Diagnosis not present

## 2017-06-15 DIAGNOSIS — D3501 Benign neoplasm of right adrenal gland: Secondary | ICD-10-CM | POA: Insufficient documentation

## 2017-06-15 DIAGNOSIS — K5730 Diverticulosis of large intestine without perforation or abscess without bleeding: Secondary | ICD-10-CM | POA: Diagnosis not present

## 2017-06-15 DIAGNOSIS — K2970 Gastritis, unspecified, without bleeding: Secondary | ICD-10-CM | POA: Insufficient documentation

## 2017-06-15 DIAGNOSIS — I429 Cardiomyopathy, unspecified: Secondary | ICD-10-CM | POA: Diagnosis not present

## 2017-06-15 DIAGNOSIS — I428 Other cardiomyopathies: Secondary | ICD-10-CM

## 2017-06-15 DIAGNOSIS — K828 Other specified diseases of gallbladder: Secondary | ICD-10-CM

## 2017-06-15 DIAGNOSIS — K829 Disease of gallbladder, unspecified: Secondary | ICD-10-CM | POA: Diagnosis not present

## 2017-06-15 DIAGNOSIS — Z95810 Presence of automatic (implantable) cardiac defibrillator: Secondary | ICD-10-CM | POA: Diagnosis not present

## 2017-06-15 DIAGNOSIS — R0602 Shortness of breath: Secondary | ICD-10-CM

## 2017-06-15 DIAGNOSIS — D509 Iron deficiency anemia, unspecified: Secondary | ICD-10-CM | POA: Insufficient documentation

## 2017-06-15 LAB — CBC
HCT: 20.1 % — ABNORMAL LOW (ref 36.0–46.0)
HEMOGLOBIN: 6.3 g/dL — AB (ref 12.0–15.0)
MCH: 20.6 pg — ABNORMAL LOW (ref 26.0–34.0)
MCHC: 31.3 g/dL (ref 30.0–36.0)
MCV: 65.7 fL — AB (ref 78.0–100.0)
PLATELETS: 292 10*3/uL (ref 150–400)
RBC: 3.06 MIL/uL — AB (ref 3.87–5.11)
RDW: 17.9 % — ABNORMAL HIGH (ref 11.5–15.5)
WBC: 7.4 10*3/uL (ref 4.0–10.5)

## 2017-06-15 LAB — IRON AND TIBC
IRON: 22 ug/dL — AB (ref 28–170)
SATURATION RATIOS: 5 % — AB (ref 10.4–31.8)
TIBC: 470 ug/dL — AB (ref 250–450)
UIBC: 448 ug/dL

## 2017-06-15 LAB — RETICULOCYTES
RBC.: 3.24 MIL/uL — ABNORMAL LOW (ref 3.87–5.11)
RETIC CT PCT: 1.2 % (ref 0.4–3.1)
Retic Count, Absolute: 38.9 10*3/uL (ref 19.0–186.0)

## 2017-06-15 LAB — COMPREHENSIVE METABOLIC PANEL
ALT: 21 U/L (ref 14–54)
ANION GAP: 7 (ref 5–15)
AST: 62 U/L — ABNORMAL HIGH (ref 15–41)
Albumin: 2.7 g/dL — ABNORMAL LOW (ref 3.5–5.0)
Alkaline Phosphatase: 112 U/L (ref 38–126)
BUN: 34 mg/dL — ABNORMAL HIGH (ref 6–20)
CHLORIDE: 108 mmol/L (ref 101–111)
CO2: 23 mmol/L (ref 22–32)
CREATININE: 2.11 mg/dL — AB (ref 0.44–1.00)
Calcium: 8.6 mg/dL — ABNORMAL LOW (ref 8.9–10.3)
GFR, EST AFRICAN AMERICAN: 26 mL/min — AB (ref >60)
GFR, EST NON AFRICAN AMERICAN: 22 mL/min — AB (ref >60)
Glucose, Bld: 99 mg/dL (ref 65–99)
Potassium: 3.8 mmol/L (ref 3.5–5.1)
SODIUM: 138 mmol/L (ref 135–145)
Total Bilirubin: 1.3 mg/dL — ABNORMAL HIGH (ref 0.3–1.2)
Total Protein: 7.4 g/dL (ref 6.5–8.1)

## 2017-06-15 LAB — FERRITIN: FERRITIN: 14 ng/mL (ref 11–307)

## 2017-06-15 LAB — I-STAT TROPONIN, ED: Troponin i, poc: 0.03 ng/mL (ref 0.00–0.08)

## 2017-06-15 LAB — BRAIN NATRIURETIC PEPTIDE: B Natriuretic Peptide: 594.9 pg/mL — ABNORMAL HIGH (ref 0.0–100.0)

## 2017-06-15 LAB — POC OCCULT BLOOD, ED: Fecal Occult Bld: POSITIVE — AB

## 2017-06-15 LAB — FOLATE: Folate: 17 ng/mL (ref >5.9)

## 2017-06-15 LAB — PREPARE RBC (CROSSMATCH)

## 2017-06-15 LAB — VITAMIN B12: Vitamin B-12: 741 pg/mL (ref 180–914)

## 2017-06-15 MED ORDER — SODIUM CHLORIDE 0.9% FLUSH: 3.0000 mL | INTRAVENOUS | Status: DC | PRN

## 2017-06-15 MED ORDER — PANTOPRAZOLE SODIUM 40 MG IV SOLR
40.0000 mg | Freq: Once | INTRAVENOUS | Status: AC
  Administered 2017-06-15: 40 mg via INTRAVENOUS
  Filled 2017-06-15: qty 40

## 2017-06-15 MED ORDER — FUROSEMIDE 40 MG PO TABS
40.0000 mg | ORAL_TABLET | Freq: Every day | ORAL | Status: DC
  Administered 2017-06-16 – 2017-06-18 (×3): 40 mg via ORAL
  Filled 2017-06-15 (×3): qty 1

## 2017-06-15 MED ORDER — SODIUM CHLORIDE 0.9 % IV SOLN: 10.0000 mL/h | Freq: Once | INTRAVENOUS | Status: DC

## 2017-06-15 MED ORDER — SODIUM CHLORIDE 0.9 % IV SOLN: Freq: Once | INTRAVENOUS | Status: DC

## 2017-06-15 MED ORDER — SODIUM CHLORIDE 0.9 % IV SOLN: 250.0000 mL | INTRAVENOUS | Status: DC | PRN

## 2017-06-15 MED ORDER — ONDANSETRON HCL 4 MG/2ML IJ SOLN: 4.0000 mg | Freq: Four times a day (QID) | INTRAMUSCULAR | Status: DC | PRN

## 2017-06-15 MED ORDER — AMIODARONE HCL 200 MG PO TABS
200.0000 mg | ORAL_TABLET | Freq: Every day | ORAL | Status: DC
  Administered 2017-06-16 – 2017-06-18 (×3): 200 mg via ORAL
  Filled 2017-06-15 (×4): qty 1

## 2017-06-15 MED ORDER — CARVEDILOL 6.25 MG PO TABS
6.2500 mg | ORAL_TABLET | Freq: Two times a day (BID) | ORAL | Status: DC
  Administered 2017-06-16 – 2017-06-18 (×5): 6.25 mg via ORAL
  Filled 2017-06-15 (×6): qty 1

## 2017-06-15 MED ORDER — HYDROCODONE-ACETAMINOPHEN 5-325 MG PO TABS
1.0000 | ORAL_TABLET | Freq: Two times a day (BID) | ORAL | Status: DC | PRN
  Administered 2017-06-15 – 2017-06-18 (×6): 1 via ORAL
  Filled 2017-06-15 (×6): qty 1

## 2017-06-15 MED ORDER — ATORVASTATIN CALCIUM 40 MG PO TABS
40.0000 mg | ORAL_TABLET | Freq: Every day | ORAL | Status: DC
  Administered 2017-06-16 – 2017-06-18 (×3): 40 mg via ORAL
  Filled 2017-06-15 (×3): qty 1

## 2017-06-15 MED ORDER — SODIUM CHLORIDE 0.9% FLUSH
3.0000 mL | Freq: Two times a day (BID) | INTRAVENOUS | Status: DC
  Administered 2017-06-15 – 2017-06-18 (×3): 3 mL via INTRAVENOUS

## 2017-06-15 MED ORDER — ONDANSETRON HCL 4 MG PO TABS: 4.0000 mg | ORAL_TABLET | Freq: Four times a day (QID) | ORAL | Status: DC | PRN

## 2017-06-15 NOTE — ED Triage Notes (Signed)
Pt reports having a check up yesterday. pcp called her today and told her to come here due to low hgb. No acute distress is noted at triage.

## 2017-06-15 NOTE — Telephone Encounter (Signed)
SENT NOTES TO SCHEDULING 

## 2017-06-15 NOTE — ED Notes (Signed)
Antibodies screen is positive per blood bank, type & screen to take a little more time.

## 2017-06-15 NOTE — ED Provider Notes (Signed)
[facility] EMERGENCY DEPARTMENT Provider Note   CSN: [id number] Arrival date & time: 06/15/17  1722     History   Chief Complaint Chief Complaint  Patient presents with  . Abnormal Lab    HPI Stephanie Yates is a 71 y.o. female with a PMHx of CKD3, anemia, HTN, HLD, nonischemic cardiomyopathy, Vtach s/p St. Judes ICD implantation, CHF (EF 20-25% in 02/2017), paroxysmal Afib on eliquis, and other conditions below, who presents to the ED after being sent from her PCPs office due to abnormal test results.  Patient was seen by her PCP Dr. Inda Merlin yesterday for her routine physical, had lab work done which resulted today and showed a hemoglobin of "6-point something" so he advised her to come to the emergency room for a blood transfusion.  She reports that she has noticed some shortness of breath with exertion which improves with rest, but otherwise she has felt well and denies any other symptoms.  She had a colonoscopy about 7 years ago (chart review reveals it was by Dr. Earle Gell of [facility] in 11/2009 but no results in the chart).  She is not aware of the results, and she does not see a GI doctor currently.  She denies ever noticing any blood in her stools, but admits that she doesn't really check it.  She denies any lightheadedness, fevers, chills, CP, cough, worsening leg swelling, abdominal pain, nausea, vomiting, diarrhea, constipation, melena, hematochezia, dysuria, hematuria, vaginal bleeding or discharge, myalgias, arthralgias, fatigue, numbness, tingling, focal weakness, or any other complaints at this time.   The history is provided by the patient and medical records. No language interpreter was used.  Abnormal Lab  Time since result:  1 day Patient referred by:  PCP Resulting agency:  External Result type: hematology   Hematology:    Hemoglobin:  Low   Past Medical History:  Diagnosis Date  . Chronic renal insufficiency, stage III (moderate) (HCC)    Cr 2.0 5/15  . Chronic systolic CHF (congestive heart failure) ([place])   . History of tobacco abuse   . Hyperlipidemia   . Hypertension   . Nonischemic cardiomyopathy ([place])    EF 10-15% on 11/2012 cath  . Sinus bradycardia 12/21/2012  . Ventricular tachycardia ([place]) 12/14/2012    Patient Active Problem List   Diagnosis Date Noted  . Chronic anticoagulation   . Acute on chronic congestive heart failure ([place])   . HLD (hyperlipidemia) 03/05/2017  . CKD (chronic kidney disease), stage III ([place]) 03/05/2017  . Chest pain 03/05/2017  . Microcytic anemia 03/05/2017  . PAF (paroxysmal atrial fibrillation) ([place]) 04/01/2013  . ICD (implantable cardioverter-defibrillator), dual, st Judes 04/01/2013  . PVC's (premature ventricular contractions) 04/01/2013  . Hypokalemia 12/21/2012  . History of tobacco abuse 12/21/2012  . Elevated troponin 12/21/2012  . Sinus bradycardia 12/21/2012  . Hyperkalemia 12/20/2012  . Nonischemic cardiomyopathy ([place]) 12/20/2012  . UTI (urinary tract infection) 12/20/2012  . Acute on chronic combined systolic and diastolic CHF (congestive heart failure) ([place]) 12/20/2012  . Ventricular tachycardia ([place]) 12/14/2012  . Hypertension 12/14/2012    Past Surgical History:  Procedure Laterality Date  . IMPLANTABLE CARDIOVERTER DEFIBRILLATOR IMPLANT  12/19/2012   St. Jude dual-chamber ICD, serial number [id number]  . IMPLANTABLE CARDIOVERTER DEFIBRILLATOR IMPLANT N/A 12/19/2012   Procedure: IMPLANTABLE CARDIOVERTER DEFIBRILLATOR IMPLANT;  Surgeon: Deboraha Sprang, MD;  Location: [facility] CATH LAB;  Service: Cardiovascular;  Laterality: N/A;  . LEFT AND RIGHT HEART CATHETERIZATION WITH CORONARY ANGIOGRAM N/A 12/17/2012  Procedure: LEFT AND RIGHT HEART CATHETERIZATION WITH CORONARY ANGIOGRAM;  Surgeon: Sherren Mocha, MD;  Location: [facility] CATH LAB;  Service: Cardiovascular;  Laterality: N/A;    OB History    No data available       Home Medications    Prior to Admission medications    Medication Sig Start Date End Date Taking? Authorizing Provider  amiodarone (PACERONE) 200 MG tablet Take 1 tablet (200 mg total) by mouth daily. 06/07/16  Yes Deboraha Sprang, MD  apixaban (ELIQUIS) 2.5 MG TABS tablet Take 1 tablet (2.5 mg total) by mouth 2 (two) times daily. 03/13/17  Yes Hongalgi, Lenis Dickinson, MD  atorvastatin (LIPITOR) 40 MG tablet Take 1 tablet (40 mg total) by mouth daily at 6 PM. 03/13/17  Yes Hongalgi, Lenis Dickinson, MD  carvedilol (COREG) 6.25 MG tablet Take 1 tablet (6.25 mg total) by mouth 2 (two) times daily with a meal. 03/13/17  Yes Hongalgi, Lenis Dickinson, MD  Cholecalciferol (VITAMIN D-3 PO) Take 1,000 Units by mouth 2 (two) times daily.    Yes [provider]  furosemide (LASIX) 40 MG tablet Take 1 tablet (40 mg total) by mouth daily. 03/13/17  Yes Hongalgi, Lenis Dickinson, MD  HYDROcodone-acetaminophen (NORCO/VICODIN) 5-325 MG tablet Take 1 tablet by mouth 2 (two) times daily as needed. for pain 04/25/17  Yes [provider]    Family History Family History  Problem Relation Age of Onset  . Cancer Mother     Social History Social History   Tobacco Use  . Smoking status: Former Research scientist (life sciences)  . Smokeless tobacco: Never Used  Substance Use Topics  . Alcohol use: No  . Drug use: No     Allergies   Strawberry extract   Review of Systems Review of Systems  Constitutional: Negative for chills, fatigue and fever.  Respiratory: Positive for shortness of breath (with exertion). Negative for cough.   Cardiovascular: Negative for chest pain and leg swelling (none changed from baseline).  Gastrointestinal: Negative for abdominal pain, blood in stool, constipation, diarrhea, nausea and vomiting.  Genitourinary: Negative for dysuria, hematuria, vaginal bleeding and vaginal discharge.  Musculoskeletal: Negative for arthralgias and myalgias.  Skin: Negative for color change.  Allergic/Immunologic: Negative for immunocompromised state.  Neurological: Negative for  weakness, light-headedness and numbness.  Psychiatric/Behavioral: Negative for confusion.   All other systems reviewed and are negative for acute change except as noted in the HPI.    Physical Exam Updated Vital Signs BP 112/64 (BP Location: Right Arm)   Pulse 72   Temp 98.9 F (37.2 C) (Oral)   Resp 18   SpO2 97%   Physical Exam  Constitutional: She is oriented to person, place, and time. Vital signs are normal. She appears well-developed and well-nourished.  Non-toxic appearance. No distress.  Afebrile, nontoxic, NAD  HENT:  Head: Normocephalic and atraumatic.  Mouth/Throat: Oropharynx is clear and moist and mucous membranes are normal.  Pale mucous membranes  Eyes: Conjunctivae and EOM are normal. Right eye exhibits no discharge. Left eye exhibits no discharge.  Conjunctival pallor  Neck: Normal range of motion. Neck supple.  Cardiovascular: Normal rate, regular rhythm, normal heart sounds and intact distal pulses. Exam reveals no gallop and no friction rub.  No murmur heard. RRR, nl s1/s2, no m/r/g, distal pulses intact, 1-2+ b/l pedal edema  Pulmonary/Chest: Effort normal and breath sounds normal. No respiratory distress. She has no decreased breath sounds. She has no wheezes. She has no rhonchi. She has no rales.  CTAB in  all lung fields, no w/r/r, no hypoxia or increased WOB, speaking in full sentences, SpO2 97% on RA   Abdominal: Soft. Normal appearance and bowel sounds are normal. She exhibits no distension. There is no tenderness. There is no rigidity, no rebound, no guarding, no CVA tenderness, no tenderness at McBurney's point and negative Murphy's sign.  Genitourinary: Rectal exam shows external hemorrhoid, internal hemorrhoid and guaiac positive stool. Rectal exam shows no fissure, no mass, no tenderness and anal tone normal.  Genitourinary Comments: Chaperone present No gross blood noted on rectal exam, brownish orangeish stool in rectal vault, normal tone, no  tenderness, no mass or fissure, +old external hemorroidal skin tags noted, +internal hemorrhoids palpable, no thrombosed hemorrhoids. FOBT+. No melanotic stool noted.   Musculoskeletal: Normal range of motion.  Neurological: She is alert and oriented to person, place, and time. She has normal strength. No sensory deficit.  Skin: Skin is warm, dry and intact. No rash noted.  Psychiatric: She has a normal mood and affect.  Nursing note and vitals reviewed.    ED Treatments / Results  Labs (all labs ordered are listed, but only abnormal results are displayed) Labs Reviewed  COMPREHENSIVE METABOLIC PANEL - Abnormal; Notable for the following components:      Result Value   BUN 34 (*)    Creatinine, Ser 2.11 (*)    Calcium 8.6 (*)    Albumin 2.7 (*)    AST 62 (*)    Total Bilirubin 1.3 (*)    GFR calc non Af Amer 22 (*)    GFR calc Af Amer 26 (*)    All other components within normal limits  CBC - Abnormal; Notable for the following components:   RBC 3.06 (*)    Hemoglobin 6.3 (*)    HCT 20.1 (*)    MCV 65.7 (*)    MCH 20.6 (*)    RDW 17.9 (*)    All other components within normal limits  POC OCCULT BLOOD, ED - Abnormal; Notable for the following components:   Fecal Occult Bld POSITIVE (*)    All other components within normal limits  BRAIN NATRIURETIC PEPTIDE  VITAMIN B12  FOLATE  IRON AND TIBC  FERRITIN  RETICULOCYTES  I-STAT TROPONIN, ED  TYPE AND SCREEN  PREPARE RBC (CROSSMATCH)    EKG  EKG Interpretation None       Radiology No results found.  Procedures Procedures (including critical care time)   Echo 03/06/17: Study Conclusions - Left ventricle: LVEF is approximately 20 to 25% with akinesis of   the mid/distal inferior, mid/distal inferoseptal, distal lateral   and apical walls ; hypokinesis of the base/mid inferior, the   base/mid lateral and distal anterior walls. The cavity size was   normal. Wall thickness was increased in a pattern of mild LVH.    Doppler parameters are consistent with abnormal left ventricular   relaxation (grade 1 diastolic dysfunction). Doppler parameters   are consistent with high ventricular filling pressure. - Mitral valve: There was mild regurgitation. - Pulmonary arteries: PA peak pressure: 44 mm Hg (S).   CRITICAL CARE-- symptomatic anemia requiring transfusion Performed by: Reece Agar   Total critical care time: 45 minutes  Critical care time was exclusive of separately billable procedures and treating other patients.  Critical care was necessary to treat or prevent imminent or life-threatening deterioration.  Critical care was time spent personally by me on the following activities: development of treatment plan with patient and/or surrogate as well as  nursing, discussions with consultants, evaluation of patient's response to treatment, examination of patient, obtaining history from patient or surrogate, ordering and performing treatments and interventions, ordering and review of laboratory studies, ordering and review of radiographic studies, pulse oximetry and re-evaluation of patient's condition.    Medications Ordered in ED Medications  0.9 %  sodium chloride infusion (not administered)  pantoprazole (PROTONIX) injection 40 mg (not administered)     Initial Impression / Assessment and Plan / ED Course  I have reviewed the triage vital signs and the nursing notes.  Pertinent labs & imaging results that were available during my care of the patient were reviewed by me and considered in my medical decision making (see chart for details).     71 y.o. female here for abnormal labs; had routine labs yesterday for her physical, and today her PCP called her because her hgb was "6 point something". Pt on eliquis. Hasn't noticed any blood in stools but she doesn't check; only symptom is SOB with exertion. Reports no worsening LE swelling, states it's at baseline. Denies other symptoms. On exam,  pale mucous membranes and conjunctiva, clear lungs, 1-2+ b/l pedal edema, no hypoxia or tachycardia, no increased WOB, no abdominal tenderness. Work up thus far reveals: CBC confirms Hgb 6.3/Hct 20.1 which is microcytic (MVC 65.7); awaiting CMP. Will add-on EKG, BNP, trop, and perform rectal exam to get FOBT. Will also get anemia panel and will need transfusion and admission. Will hold off on any imaging for now. Will reassess shortly.   7:08 PM Rectal exam reveals brownish orangish stool in vault, no gross blood, old external hemorrhoidal skin tags noted, palpable internal hemorrhoids noted. FOBT+. CMP with stable kidney function, slightly elevated AST 62 and Tbili 1.3 but otherwise fairly unremarkable. GI bleed likely source of acute drop in Hgb, will start on protonix. Will proceed with transfusion and admission. Will also touch base with GI so they're on board as well. Remainder of work up not yet done.   7:25 PM Dr. Shanon Brow of [facility] returning page, will admit. Awaiting GI call back.   7:41 PM Trop neg. Rest of work up pending. Dr. Winnifred Friar of [facility] returning page and will consult on pt while she's admitted. Holding orders to be placed by admitting team. Please see their notes for further documentation of care. I appreciate their help with this pleasant pt's care. Pt stable at time of admission.     Final Clinical Impressions(s) / ED Diagnoses   Final diagnoses:  Symptomatic anemia  SOB (shortness of breath) on exertion  Acute GI bleeding  On continuous oral anticoagulation  Stage 3 chronic kidney disease [facility])    ED Discharge Orders    [address] [place], [place] 06/15/17 1942    Duffy Bruce, MD 06/15/17 [phone]

## 2017-06-15 NOTE — H&P (Signed)
History and Physical    Stephanie Yates KKX:[id number] DOB: [birth year] DOA: 06/15/2017  PCP: Josetta Huddle, MD  Patient coming from:  home  Chief Complaint:   Abnormal labs  HPI: Stephanie Yates is a 71 y.o. female with medical history significant of CKD, HTN, NICM comes in after getting abnormal labs done at PCP yesterday with hgb of 6.  Pt reports no weakness or fatigue.  She cannot say if she has had bloody stools as she never looks at her stool.  She denies any abdominal pain.  No nausea or vomiting.  No weight loss.  Reports normal colonoscopy in 2011.  Pt referred for admission for likely gi bleed, heme positive in ED and maroonish stool on rectal per PA in ED.  GI called for consult.  Review of Systems: As per HPI otherwise 10 point review of systems negative.   Past Medical History:  Diagnosis Date  . Chronic renal insufficiency, stage III (moderate) (HCC)    Cr 2.0 5/15  . Chronic systolic CHF (congestive heart failure) ([place])   . History of tobacco abuse   . Hyperlipidemia   . Hypertension   . Nonischemic cardiomyopathy ([place])    EF 10-15% on 11/2012 cath  . Sinus bradycardia 12/21/2012  . Ventricular tachycardia ([place]) 12/14/2012    Past Surgical History:  Procedure Laterality Date  . IMPLANTABLE CARDIOVERTER DEFIBRILLATOR IMPLANT  12/19/2012   St. Jude dual-chamber ICD, serial number [id number]  . IMPLANTABLE CARDIOVERTER DEFIBRILLATOR IMPLANT N/A 12/19/2012   Procedure: IMPLANTABLE CARDIOVERTER DEFIBRILLATOR IMPLANT;  Surgeon: Deboraha Sprang, MD;  Location: [facility] CATH LAB;  Service: Cardiovascular;  Laterality: N/A;  . LEFT AND RIGHT HEART CATHETERIZATION WITH CORONARY ANGIOGRAM N/A 12/17/2012   Procedure: LEFT AND RIGHT HEART CATHETERIZATION WITH CORONARY ANGIOGRAM;  Surgeon: Sherren Mocha, MD;  Location: [facility] CATH LAB;  Service: Cardiovascular;  Laterality: N/A;     reports that she has quit smoking. she has never used smokeless tobacco. She reports that she does not drink  alcohol or use drugs.  Allergies  Allergen Reactions  . Strawberry Extract Hives, Itching, Swelling and Other (See Comments)    Hives and itching     Family History  Problem Relation Age of Onset  . Cancer Mother     Prior to Admission medications   Medication Sig Start Date End Date Taking? Authorizing Provider  amiodarone (PACERONE) 200 MG tablet Take 1 tablet (200 mg total) by mouth daily. 06/07/16  Yes Deboraha Sprang, MD  apixaban (ELIQUIS) 2.5 MG TABS tablet Take 1 tablet (2.5 mg total) by mouth 2 (two) times daily. 03/13/17  Yes Hongalgi, Lenis Dickinson, MD  atorvastatin (LIPITOR) 40 MG tablet Take 1 tablet (40 mg total) by mouth daily at 6 PM. 03/13/17  Yes Hongalgi, Lenis Dickinson, MD  carvedilol (COREG) 6.25 MG tablet Take 1 tablet (6.25 mg total) by mouth 2 (two) times daily with a meal. 03/13/17  Yes Hongalgi, Lenis Dickinson, MD  Cholecalciferol (VITAMIN D-3 PO) Take 1,000 Units by mouth 2 (two) times daily.    Yes [provider]  furosemide (LASIX) 40 MG tablet Take 1 tablet (40 mg total) by mouth daily. 03/13/17  Yes Hongalgi, Lenis Dickinson, MD  HYDROcodone-acetaminophen (NORCO/VICODIN) 5-325 MG tablet Take 1 tablet by mouth 2 (two) times daily as needed. for pain 04/25/17  Yes [provider]    Physical Exam: Vitals:   06/15/17 1733  BP: 112/64  Pulse: 72  Resp: 18  Temp: 98.9 F (37.2 C)  TempSrc: Oral  SpO2: 97%      Constitutional: NAD, calm, comfortable Vitals:   06/15/17 1733  BP: 112/64  Pulse: 72  Resp: 18  Temp: 98.9 F (37.2 C)  TempSrc: Oral  SpO2: 97%   Eyes: PERRL, lids and conjunctivae normal ENMT: Mucous membranes are moist. Posterior pharynx clear of any exudate or lesions.Normal dentition.  Neck: normal, supple, no masses, no thyromegaly Respiratory: clear to auscultation bilaterally, no wheezing, no crackles. Normal respiratory effort. No accessory muscle use.  Cardiovascular: Regular rate and rhythm, no murmurs / rubs / gallops. No extremity  edema. 2+ pedal pulses. No carotid bruits.  Abdomen: no tenderness, no masses palpated. No hepatosplenomegaly. Bowel sounds positive.  Musculoskeletal: no clubbing / cyanosis. No joint deformity upper and lower extremities. Good ROM, no contractures. Normal muscle tone.  Skin: no rashes, lesions, ulcers. No induration Neurologic: CN 2-12 grossly intact. Sensation intact, DTR normal. Strength 5/5 in all 4.  Psychiatric: Normal judgment and insight. Alert and oriented x 3. Normal mood.    Labs on Admission: I have personally reviewed following labs and imaging studies  CBC: Recent Labs  Lab 06/15/17 1735  WBC 7.4  HGB 6.3*  HCT 20.1*  MCV 65.7*  PLT 854   Basic Metabolic Panel: Recent Labs  Lab 06/15/17 1735  NA 138  K 3.8  CL 108  CO2 23  GLUCOSE 99  BUN 34*  CREATININE 2.11*  CALCIUM 8.6*   GFR: CrCl cannot be calculated (Unknown ideal weight.). Liver Function Tests: Recent Labs  Lab 06/15/17 1735  AST 62*  ALT 21  ALKPHOS 112  BILITOT 1.3*  PROT 7.4  ALBUMIN 2.7*   No results for input(s): LIPASE, AMYLASE in the last 168 hours. No results for input(s): AMMONIA in the last 168 hours. Coagulation Profile: No results for input(s): INR, PROTIME in the last 168 hours. Cardiac Enzymes: No results for input(s): CKTOTAL, CKMB, CKMBINDEX, TROPONINI in the last 168 hours. BNP (last 3 results) No results for input(s): PROBNP in the last 8760 hours. HbA1C: No results for input(s): HGBA1C in the last 72 hours. CBG: No results for input(s): GLUCAP in the last 168 hours. Lipid Profile: No results for input(s): CHOL, HDL, LDLCALC, TRIG, CHOLHDL, LDLDIRECT in the last 72 hours. Thyroid Function Tests: No results for input(s): TSH, T4TOTAL, FREET4, T3FREE, THYROIDAB in the last 72 hours. Anemia Panel: No results for input(s): VITAMINB12, FOLATE, FERRITIN, TIBC, IRON, RETICCTPCT in the last 72 hours. Urine analysis:    Component Value Date/Time   COLORURINE YELLOW  08/29/2014 2035   APPEARANCEUR CLOUDY (A) 08/29/2014 2035   LABSPEC 1.017 08/29/2014 2035   PHURINE 6.0 08/29/2014 2035   GLUCOSEU NEGATIVE 08/29/2014 2035   HGBUR SMALL (A) 08/29/2014 2035   BILIRUBINUR NEGATIVE 08/29/2014 2035   KETONESUR NEGATIVE 08/29/2014 2035   PROTEINUR NEGATIVE 08/29/2014 2035   UROBILINOGEN 1.0 08/29/2014 2035   NITRITE NEGATIVE 08/29/2014 2035   LEUKOCYTESUR TRACE (A) 08/29/2014 2035   Sepsis Labs: !!!!!!!!!!!!!!!!!!!!!!!!!!!!!!!!!!!!!!!!!!!! @LABRCNTIP (procalcitonin:4,lacticidven:4) )No results found for this or any previous visit (from the past 240 hour(s)).   Radiological Exams on Admission: No results found.  EKG: Independently reviewed.  afib nonsp ivcd Old chart reviewed Case discussed with edp  Assessment/Plan 71 yo female with microcytic anemia, heme positive stools  Principal Problem:   GIB (gastrointestinal bleeding) presumptive.- gi consulted.  Keep npo in case scoping done in the am.  Transfuse 2 units prbc.  Monitor for overt bleeding.  Hold eloquis.  If scoping cannot  be done tomorrow family wants to wait til after holidays if possible to do GI work up.  Active Problems:   Microcytic anemia- transfuse 2 units   Nonischemic cardiomyopathy (HCC)- stable   PAF (paroxysmal atrial fibrillation) ([place])- holding eloquis at this time   CKD (chronic kidney disease), stage III ([place])- stable   Chronic anticoagulation- holding eloquis    DVT prophylaxis: sdcs Code Status: full Family Communication:  daughter Disposition Plan:  Per day team Consults called:  gi Admission status:  observation    Anne Sebring A MD [facility]  If 7PM-7AM, please contact night-coverage [email or web address] Password [email or web address]  06/15/2017, 8:00 PM

## 2017-06-16 ENCOUNTER — Telehealth: Payer: Self-pay

## 2017-06-16 ENCOUNTER — Encounter (HOSPITAL_COMMUNITY): Payer: Self-pay | Admitting: Gastroenterology

## 2017-06-16 ENCOUNTER — Observation Stay (HOSPITAL_COMMUNITY): Payer: Medicare Other

## 2017-06-16 DIAGNOSIS — I480 Paroxysmal atrial fibrillation: Secondary | ICD-10-CM | POA: Diagnosis not present

## 2017-06-16 DIAGNOSIS — N183 Chronic kidney disease, stage 3 (moderate): Secondary | ICD-10-CM

## 2017-06-16 DIAGNOSIS — I428 Other cardiomyopathies: Secondary | ICD-10-CM

## 2017-06-16 DIAGNOSIS — D649 Anemia, unspecified: Secondary | ICD-10-CM | POA: Diagnosis not present

## 2017-06-16 DIAGNOSIS — Z7901 Long term (current) use of anticoagulants: Secondary | ICD-10-CM | POA: Diagnosis not present

## 2017-06-16 DIAGNOSIS — K922 Gastrointestinal hemorrhage, unspecified: Secondary | ICD-10-CM | POA: Diagnosis not present

## 2017-06-16 LAB — CBC
HCT: 22.6 % — ABNORMAL LOW (ref 36.0–46.0)
HEMOGLOBIN: 7.3 g/dL — AB (ref 12.0–15.0)
MCH: 22.5 pg — ABNORMAL LOW (ref 26.0–34.0)
MCHC: 32.3 g/dL (ref 30.0–36.0)
MCV: 69.5 fL — ABNORMAL LOW (ref 78.0–100.0)
Platelets: 217 10*3/uL (ref 150–400)
RBC: 3.25 MIL/uL — AB (ref 3.87–5.11)
RDW: 19.9 % — ABNORMAL HIGH (ref 11.5–15.5)
WBC: 5.7 10*3/uL (ref 4.0–10.5)

## 2017-06-16 LAB — BASIC METABOLIC PANEL
ANION GAP: 8 (ref 5–15)
BUN: 32 mg/dL — ABNORMAL HIGH (ref 6–20)
CALCIUM: 8.5 mg/dL — AB (ref 8.9–10.3)
CO2: 22 mmol/L (ref 22–32)
Chloride: 109 mmol/L (ref 101–111)
Creatinine, Ser: 1.87 mg/dL — ABNORMAL HIGH (ref 0.44–1.00)
GFR, EST AFRICAN AMERICAN: 30 mL/min — AB (ref >60)
GFR, EST NON AFRICAN AMERICAN: 26 mL/min — AB (ref >60)
GLUCOSE: 75 mg/dL (ref 65–99)
Potassium: 3.7 mmol/L (ref 3.5–5.1)
Sodium: 139 mmol/L (ref 135–145)

## 2017-06-16 LAB — PREPARE RBC (CROSSMATCH)

## 2017-06-16 MED ORDER — FUROSEMIDE 10 MG/ML IJ SOLN
20.0000 mg | Freq: Once | INTRAMUSCULAR | Status: AC
  Administered 2017-06-16: 20 mg via INTRAVENOUS
  Filled 2017-06-16: qty 2

## 2017-06-16 MED ORDER — BARIUM SULFATE 2.1 % PO SUSP
ORAL | Status: AC
  Administered 2017-06-16: 1 mL via ORAL
  Filled 2017-06-16: qty 2

## 2017-06-16 MED ORDER — SODIUM CHLORIDE 0.9 % IV SOLN
Freq: Once | INTRAVENOUS | Status: AC
  Administered 2017-06-16: 11:00:00 via INTRAVENOUS

## 2017-06-16 NOTE — Consult Note (Signed)
Reason for Consult: Anemia guaiac positivity Referring Physician: Hospital team  Stephanie Yates is an 71 y.o. female.  HPI: Patient seen and examined and hospital computer chart reviewed and office computer chart reviewed and unfortunately she had an ultrasound with a worrisome gallbladder finding 2 months ago and was referred to [place] but never kept her appointment and she has had a normal endoscopy in 2015 and her last colonoscopy was 2011 which did show some left-sided diverticuli and she does not have any GI complaints and specifically she's not seen any blood she is not aware he was anemic despite being anemic for a while and she is not on iron at home but she is on a blood thinner and she denies specifically any swallowing problems abdominal pain or change in bowel habits and she says her family history is negative and has no other complaints  Past Medical History:  Diagnosis Date  . Chronic renal insufficiency, stage III (moderate) (HCC)    Cr 2.0 5/15  . Chronic systolic CHF (congestive heart failure) ([place])   . History of tobacco abuse   . Hyperlipidemia   . Hypertension   . Nonischemic cardiomyopathy ([place])    EF 10-15% on 11/2012 cath  . Sinus bradycardia 12/21/2012  . Ventricular tachycardia ([place]) 12/14/2012    Past Surgical History:  Procedure Laterality Date  . IMPLANTABLE CARDIOVERTER DEFIBRILLATOR IMPLANT  12/19/2012   St. Jude dual-chamber ICD, serial number [id number]  . IMPLANTABLE CARDIOVERTER DEFIBRILLATOR IMPLANT N/A 12/19/2012   Procedure: IMPLANTABLE CARDIOVERTER DEFIBRILLATOR IMPLANT;  Surgeon: Deboraha Sprang, MD;  Location: [facility] CATH LAB;  Service: Cardiovascular;  Laterality: N/A;  . LEFT AND RIGHT HEART CATHETERIZATION WITH CORONARY ANGIOGRAM N/A 12/17/2012   Procedure: LEFT AND RIGHT HEART CATHETERIZATION WITH CORONARY ANGIOGRAM;  Surgeon: Sherren Mocha, MD;  Location: [facility] CATH LAB;  Service: Cardiovascular;  Laterality: N/A;    Family History  Problem Relation Age of  Onset  . Cancer Mother     Social History:  reports that she has quit smoking. she has never used smokeless tobacco. She reports that she does not drink alcohol or use drugs.  Allergies:  Allergies  Allergen Reactions  . Strawberry Extract Hives, Itching, Swelling and Other (See Comments)    Hives and itching     Medications: I have reviewed the patient's current medications.  Results for orders placed or performed during the hospital encounter of 06/15/17 (from the past 48 hour(s))  Comprehensive metabolic panel     Status: Abnormal   Collection Time: 06/15/17  5:35 PM  Result Value Ref Range   Sodium 138 135 - 145 mmol/L   Potassium 3.8 3.5 - 5.1 mmol/L   Chloride 108 101 - 111 mmol/L   CO2 23 22 - 32 mmol/L   Glucose, Bld 99 65 - 99 mg/dL   BUN 34 (H) 6 - 20 mg/dL   Creatinine, Ser 2.11 (H) 0.44 - 1.00 mg/dL   Calcium 8.6 (L) 8.9 - 10.3 mg/dL   Total Protein 7.4 6.5 - 8.1 g/dL   Albumin 2.7 (L) 3.5 - 5.0 g/dL   AST 62 (H) 15 - 41 U/L   ALT 21 14 - 54 U/L   Alkaline Phosphatase 112 38 - 126 U/L   Total Bilirubin 1.3 (H) 0.3 - 1.2 mg/dL   GFR calc non Af Amer 22 (L) >60 mL/min   GFR calc Af Amer 26 (L) >60 mL/min    Comment: (NOTE) The eGFR has been calculated using the CKD EPI  equation. This calculation has not been validated in all clinical situations. eGFR's persistently <60 mL/min signify possible Chronic Kidney Disease.    Anion gap 7 5 - 15  CBC     Status: Abnormal   Collection Time: 06/15/17  5:35 PM  Result Value Ref Range   WBC 7.4 4.0 - 10.5 K/uL   RBC 3.06 (L) 3.87 - 5.11 MIL/uL   Hemoglobin 6.3 (LL) 12.0 - 15.0 g/dL    Comment: REPEATED TO VERIFY CRITICAL RESULT CALLED TO, READ BACK BY AND VERIFIED WITH: H HALL,RN 1802 06/15/17 D BRADLEY    HCT 20.1 (L) 36.0 - 46.0 %   MCV 65.7 (L) 78.0 - 100.0 fL   MCH 20.6 (L) 26.0 - 34.0 pg   MCHC 31.3 30.0 - 36.0 g/dL   RDW 17.9 (H) 11.5 - 15.5 %   Platelets 292 150 - 400 K/uL  Type and screen [place]     Status: None (Preliminary result)   Collection Time: 06/15/17  5:40 PM  Result Value Ref Range   ABO/RH(D) O POS    Antibody Screen POS    Sample Expiration 06/18/2017    DAT, IgG NEG    Antibody Identification ANTI E    PT AG Type NEGATIVE FOR E ANTIGEN    Unit Number [id number]    Blood Component Type RED CELLS,LR    Unit division 00    Status of Unit ISSUED,FINAL    Donor AG Type NEGATIVE FOR E ANTIGEN    Transfusion Status OK TO TRANSFUSE    Crossmatch Result COMPATIBLE    Unit Number [id number]    Blood Component Type RED CELLS,LR    Unit division 00    Status of Unit ISSUED    Donor AG Type NEGATIVE FOR E ANTIGEN    Transfusion Status OK TO TRANSFUSE    Crossmatch Result COMPATIBLE    Unit Number [id number]    Blood Component Type RED CELLS,LR    Unit division 00    Status of Unit ALLOCATED    Donor AG Type NEGATIVE FOR E ANTIGEN    Transfusion Status OK TO TRANSFUSE    Crossmatch Result COMPATIBLE   POC occult blood, ED     Status: Abnormal   Collection Time: 06/15/17  6:53 PM  Result Value Ref Range   Fecal Occult Bld POSITIVE (A) NEGATIVE  Prepare RBC     Status: None   Collection Time: 06/15/17  7:00 PM  Result Value Ref Range   Order Confirmation ORDER PROCESSED BY BLOOD BANK   Brain natriuretic peptide     Status: Abnormal   Collection Time: 06/15/17  7:02 PM  Result Value Ref Range   B Natriuretic Peptide 594.9 (H) 0.0 - 100.0 pg/mL  Vitamin B12     Status: None   Collection Time: 06/15/17  7:02 PM  Result Value Ref Range   Vitamin B-12 741 180 - 914 pg/mL    Comment: (NOTE) This assay is not validated for testing neonatal or myeloproliferative syndrome specimens for Vitamin B12 levels.   Folate     Status: None   Collection Time: 06/15/17  7:02 PM  Result Value Ref Range   Folate 17.0 >5.9 ng/mL  Iron and TIBC     Status: Abnormal   Collection Time: 06/15/17  7:02 PM  Result Value Ref Range   Iron 22 (L) 28 - 170  ug/dL   TIBC 470 (H) 250 - 450 ug/dL   Saturation Ratios  5 (L) 10.4 - 31.8 %   UIBC 448 ug/dL  Ferritin     Status: None   Collection Time: 06/15/17  7:02 PM  Result Value Ref Range   Ferritin 14 11 - 307 ng/mL  Reticulocytes     Status: Abnormal   Collection Time: 06/15/17  7:02 PM  Result Value Ref Range   Retic Ct Pct 1.2 0.4 - 3.1 %   RBC. 3.24 (L) 3.87 - 5.11 MIL/uL   Retic Count, Absolute 38.9 19.0 - 186.0 K/uL  I-stat troponin, ED     Status: None   Collection Time: 06/15/17  7:23 PM  Result Value Ref Range   Troponin i, poc 0.03 0.00 - 0.08 ng/mL   Comment 3            Comment: Due to the release kinetics of cTnI, a negative result within the first hours of the onset of symptoms does not rule out myocardial infarction with certainty. If myocardial infarction is still suspected, repeat the test at appropriate intervals.   Basic metabolic panel     Status: Abnormal   Collection Time: 06/16/17  4:50 AM  Result Value Ref Range   Sodium 139 135 - 145 mmol/L   Potassium 3.7 3.5 - 5.1 mmol/L   Chloride 109 101 - 111 mmol/L   CO2 22 22 - 32 mmol/L   Glucose, Bld 75 65 - 99 mg/dL   BUN 32 (H) 6 - 20 mg/dL   Creatinine, Ser 1.87 (H) 0.44 - 1.00 mg/dL   Calcium 8.5 (L) 8.9 - 10.3 mg/dL   GFR calc non Af Amer 26 (L) >60 mL/min   GFR calc Af Amer 30 (L) >60 mL/min    Comment: (NOTE) The eGFR has been calculated using the CKD EPI equation. This calculation has not been validated in all clinical situations. eGFR's persistently <60 mL/min signify possible Chronic Kidney Disease.    Anion gap 8 5 - 15  CBC     Status: Abnormal   Collection Time: 06/16/17  4:50 AM  Result Value Ref Range   WBC 5.7 4.0 - 10.5 K/uL   RBC 3.25 (L) 3.87 - 5.11 MIL/uL   Hemoglobin 7.3 (L) 12.0 - 15.0 g/dL   HCT 22.6 (L) 36.0 - 46.0 %   MCV 69.5 (L) 78.0 - 100.0 fL   MCH 22.5 (L) 26.0 - 34.0 pg   MCHC 32.3 30.0 - 36.0 g/dL   RDW 19.9 (H) 11.5 - 15.5 %   Platelets 217 150 - 400 K/uL     Comment: CONSISTENT WITH PREVIOUS RESULT  Prepare RBC     Status: None   Collection Time: 06/16/17  7:56 AM  Result Value Ref Range   Order Confirmation ORDER PROCESSED BY BLOOD BANK     No results found.  ROS negative except above Blood pressure 114/76, pulse 74, temperature 98.2 F (36.8 C), temperature source Oral, resp. rate 16, height 5' 5"  (1.651 m), weight 65.7 kg (144 lb 14.4 oz), SpO2 97 %. Physical Exam vital signs stable afebrile no acute distress lungs are clear decreased heart sounds abdomen is soft nontender minimal pedal edema labs reviewed  Assessment/Plan: Multiple medical problems including iron deficiency guaiac positivity abnormal ultrasound Plan: I looked at the endoscopy schedule today but no availability so we will proceed with a CT to rule out anything significant and reevaluate her gallbladder and then decide further workup and plans in the hospital doctors noted they suggested an outpatient workup which would be  fine but we need to ensure compliance and okay with me to resume her blood thinner if CT does not show something that requires immediate attention or biopsy and she would probably benefit from being on iron or IV iron periodically  Luane Rochon E 06/16/2017, 10:11 AM

## 2017-06-16 NOTE — Telephone Encounter (Signed)
Sent notes to scheduling 

## 2017-06-16 NOTE — Progress Notes (Addendum)
[facility] PROGRESS NOTE  Stephanie GODSIL MIW:[id number] DOB: [birth year] DOA: 06/15/2017 PCP: Josetta Huddle, MD   Interim summary and HPI 71 y.o. female with medical history significant of CKD, HTN, NICM comes in after getting abnormal labs done at PCP yesterday with hgb of 6.  Pt reports no weakness or fatigue.  She cannot say if she has had bloody stools as she never looks at her stool.  She denies any abdominal pain.  No nausea or vomiting.  No weight loss.  Reports normal colonoscopy in 2011.  Pt referred for admission for likely gi bleed, heme positive in ED and maroonish stool on rectal exam.  Assessment/Plan: 1-symptomatic anemia: patient reported some fatigue to me and found with Hgb of 6. -patient with positive FOBT  -3 units of PRBC's given -will follow Hgb in am -GI allowed diet and will check CT abd; will follow further rec's -no frank bleeding appreciated -continue holding eliquis  -continue PPI  2-HTN: -stable -continue current antihypertensive regimen   3-non-ischemic cardiomyopathy  -stable -follow daily weights  -strict intake and output  -continue coreg  4-PAF -rate controlled -continue coreg and pacerone  -holding Eliquis   5-HLD -continue lipitor   6-CKD stage 3 -stable and at baseline  -will monitor trend   Code Status: Full Family Communication: no family at bedside  Disposition Plan: follow CT abdomen as recommended by GI, transfusing another unit of PRBC and and follow Hgb trend.   Consultants:  GI  Procedures:  See below for x-ray reports   Antibiotics:  None   HPI/Subjective: Afebrile, no CP, no SOB, no palpitations. Denies nausea, vomiting and abd pain. Patient denies any frank blood in her stools.   Objective: Vitals:   06/16/17 1448 06/16/17 1725  BP: (!) 118/52 (!) 137/55  Pulse: (!) 57 (!) 58  Resp: 18   Temp: 98.2 F (36.8 C)   SpO2: 96%     Intake/Output Summary (Last 24 hours) at 06/16/2017 1829 Last data  filed at 06/16/2017 1500 Gross per 24 hour  Intake 900 ml  Output 1900 ml  Net -1000 ml   Filed Weights   06/15/17 2043  Weight: 65.7 kg (144 lb 14.4 oz)    Exam:   General: afebrile, no CP, no SOB, no abd pain. Patient denies palpitations and expressed.  Cardiovascular: S1 and S2, no rubs, no gallops  Respiratory: good air movement, no wheezing, no crackles  Abdomen: soft, NT, ND, positive BS  Musculoskeletal: no edema, no cyanosis   Data Reviewed: Basic Metabolic Panel: Recent Labs  Lab 06/15/17 1735 06/16/17 0450  NA 138 139  K 3.8 3.7  CL 108 109  CO2 23 22  GLUCOSE 99 75  BUN 34* 32*  CREATININE 2.11* 1.87*  CALCIUM 8.6* 8.5*   Liver Function Tests: Recent Labs  Lab 06/15/17 1735  AST 62*  ALT 21  ALKPHOS 112  BILITOT 1.3*  PROT 7.4  ALBUMIN 2.7*   CBC: Recent Labs  Lab 06/15/17 1735 06/16/17 0450  WBC 7.4 5.7  HGB 6.3* 7.3*  HCT 20.1* 22.6*  MCV 65.7* 69.5*  PLT 292 217   BNP (last 3 results) Recent Labs    03/04/17 2040 06/15/17 1902  BNP 885.7* 594.9*    Studies: No results found.  Scheduled Meds: . amiodarone  200 mg Oral Daily  . atorvastatin  40 mg Oral q1800  . carvedilol  6.25 mg Oral BID WC  . furosemide  40 mg Oral Daily  . sodium  chloride flush  3 mL Intravenous Q12H   Continuous Infusions: . sodium chloride    . sodium chloride      Principal Problem:   Acute GI bleeding Active Problems:   Nonischemic cardiomyopathy (HCC)   PAF (paroxysmal atrial fibrillation) (HCC)   CKD (chronic kidney disease), stage III (HCC)   Symptomatic anemia   Chronic anticoagulation   Time spent: 25 minutes   [place] Pager [phone]. If 7PM-7AM, please contact night-coverage at [email or web address], password [facility] 06/16/2017, 6:29 PM  LOS: 0 days

## 2017-06-17 DIAGNOSIS — K922 Gastrointestinal hemorrhage, unspecified: Secondary | ICD-10-CM | POA: Diagnosis not present

## 2017-06-17 DIAGNOSIS — Z7901 Long term (current) use of anticoagulants: Secondary | ICD-10-CM | POA: Diagnosis not present

## 2017-06-17 DIAGNOSIS — N183 Chronic kidney disease, stage 3 (moderate): Secondary | ICD-10-CM | POA: Diagnosis not present

## 2017-06-17 DIAGNOSIS — D649 Anemia, unspecified: Secondary | ICD-10-CM | POA: Diagnosis not present

## 2017-06-17 LAB — CBC
HEMATOCRIT: 26.9 % — AB (ref 36.0–46.0)
Hemoglobin: 9 g/dL — ABNORMAL LOW (ref 12.0–15.0)
MCH: 23.5 pg — AB (ref 26.0–34.0)
MCHC: 33.5 g/dL (ref 30.0–36.0)
MCV: 70.2 fL — AB (ref 78.0–100.0)
PLATELETS: 189 10*3/uL (ref 150–400)
RBC: 3.83 MIL/uL — ABNORMAL LOW (ref 3.87–5.11)
RDW: 20.6 % — AB (ref 11.5–15.5)
WBC: 5.7 10*3/uL (ref 4.0–10.5)

## 2017-06-17 LAB — FERRITIN: FERRITIN: 15 ng/mL (ref 11–307)

## 2017-06-17 NOTE — Progress Notes (Signed)
[facility] PROGRESS NOTE  Stephanie Yates UTM:[id number] DOB: 24-Sep-1945 DOA: 06/15/2017 PCP: Josetta Huddle, MD   Interim summary and HPI 71 y.o. female with medical history significant of CKD, HTN, NICM comes in after getting abnormal labs done at PCP yesterday with hgb of 6.  Pt reports no weakness or fatigue.  She cannot say if she has had bloody stools as she never looks at her stool.  She denies any abdominal pain.  No nausea or vomiting.  No weight loss.  Reports normal colonoscopy in 2011.  Pt referred for admission for likely gi bleed, heme positive in ED and maroonish stool on rectal exam.  Assessment/Plan: 1-symptomatic anemia: patient reported some fatigue to me and found with Hgb of 6. -patient with positive FOBT and maroon stools on admission. -3 units of PRBC's given -Hemoglobin 9.0 today -follow Hgb trend  -Following GI recommendations plan is to pursuit EGD during his hospitalization. -Continue holding Eliquis   -Continue PPI   2-HTN: -Well-controlled. -Continue Coreg and lasix  3-non-ischemic cardiomyopathy  -Last ejection fraction 20-25% -Stable and compensated -Continue to follow daily weights, strict intake and output, low-sodium diet. -Continue beta-blockers; no use of ACE/ARB in the setting of chronic kidney disease.  4-PAF -rate has remained controlled -continue amiodarone and coreg -continue holding Eliquis  5-HLD -Continue statins   6-CKD stage 3 -Stable and currently at baseline -Continue monitoring renal function trend intermittently.  7-hepatic cirrhosis/portal hypertension -Check hepatitis B antigen, hepatitis C antibody, ceruloplasmin, alpha 1 antitrypsin, AMA and ASMA. -GI on board, will follow rec's  Code Status: Full Family Communication: no family at bedside  Disposition Plan: CT abdomen is recommended by GI demonstrated findings that suggest cirrhosis; patient hemodynamically stable and unsure if there is any further blood in her  stools (she had had a bowel movement but did not pay attention to the appearance).  Hemoglobin 9.0 and is stable.  Following GI recommendations will pursuit workup for chronic liver disease (hepatitis B antigen, hepatitis C antibody, ceruloplasmin, alpha 1 antitrypsin, AMA, ASMA).    Consultants:  GI  Procedures:  See below for x-ray reports   Plan EGD: Either 12/23 or 12/24.  Antibiotics:  None   HPI/Subjective: No fever, no chest pain, no shortness of breath, no palpitations.  Patient denies any vomiting or abdominal pain; but reported some intermittent nausea.  She ended having a bowel movement overnight but did not pay attention if there was any blood in it.  Objective: Vitals:   06/17/17 0509 06/17/17 1457  BP: (!) 130/48 (!) 126/58  Pulse: 64 (!) 52  Resp: 17 17  Temp: 98.5 F (36.9 C) 97.7 F (36.5 C)  SpO2: 92% 98%    Intake/Output Summary (Last 24 hours) at 06/17/2017 1554 Last data filed at 06/17/2017 1451 Gross per 24 hour  Intake 440 ml  Output 700 ml  Net -260 ml   Filed Weights   06/15/17 2043  Weight: 65.7 kg (144 lb 14.4 oz)    Exam:   General: No fever, no chest pain, no shortness of breath, no abdominal pain.  Patient reports some intermittent nausea but denies any vomiting.  Patient had bowel movement overnight but did not pay attention if it was black or if it had any blood content in it.  Cardiovascular: S1 and S2, no rubs, no gallops, no JVD.   Respiratory: Good air movement bilaterally, no wheezing, no crackles, normal respiratory effort.  Abdomen: Soft, nontender, nondistended, no guarding, positive bowel sounds.  Musculoskeletal: No  edema, no cyanosis, no clubbing.  Data Reviewed: Basic Metabolic Panel: Recent Labs  Lab 06/15/17 1735 06/16/17 0450  NA 138 139  K 3.8 3.7  CL 108 109  CO2 23 22  GLUCOSE 99 75  BUN 34* 32*  CREATININE 2.11* 1.87*  CALCIUM 8.6* 8.5*   Liver Function Tests: Recent Labs  Lab 06/15/17 1735   AST 62*  ALT 21  ALKPHOS 112  BILITOT 1.3*  PROT 7.4  ALBUMIN 2.7*   CBC: Recent Labs  Lab 06/15/17 1735 06/16/17 0450 06/17/17 0625  WBC 7.4 5.7 5.7  HGB 6.3* 7.3* 9.0*  HCT 20.1* 22.6* 26.9*  MCV 65.7* 69.5* 70.2*  PLT 292 217 189   BNP (last 3 results) Recent Labs    03/04/17 2040 06/15/17 1902  BNP 885.7* 594.9*    Studies: Ct Abdomen Pelvis Wo Contrast  Result Date: 06/17/2017 CLINICAL DATA:  71 year old female with abdominal discomfort and history of recent abnormal ultrasound (masslike region near the fundus of the gallbladder). EXAM: CT ABDOMEN AND PELVIS WITHOUT CONTRAST TECHNIQUE: Multidetector CT imaging of the abdomen and pelvis was performed following the standard protocol without IV contrast. COMPARISON:  Abdominal ultrasound 04/05/2017 ; prior CT scan of the abdomen and pelvis 08/29/2014 FINDINGS: Lower chest: Cardiomegaly with left heart enlargement. A cardiac rhythm maintenance device is partially imaged. Leads project over the right ventricle. No pericardial effusion. Small hiatal hernia. Nonspecific patchy opacities in both lung bases raise the possibility of an active infectious/ inflammatory process. There is also mild dependent atelectasis. Hepatobiliary: Subtle nodularity of the hepatic contour and the liver is relatively hyperdense compared to the spleen. The portal vein appears slightly engorged. There is trace perihepatic ascites. The gallbladder is somewhat poorly imaged in the absence of intravenous contrast. No obvious radiopaque stones or biliary ductal dilatation. Pancreas: Unremarkable. No pancreatic ductal dilatation or surrounding inflammatory changes. Spleen: Normal in size without focal abnormality. Adrenals/Urinary Tract: Slightly enlarged low-attenuation lesion exophytic from the right adrenal gland now measuring 3.2 by 1.8 cm. This likely represents a benign hemangioma, or myelolipoma. The left adrenal gland remains unchanged. No evidence of  hydronephrosis or renal edema. Nonobstructing 5 mm stone in the lower pole of the right kidney. Punctate stone in the lower pole of the left kidney. The bladder is unremarkable. Stomach/Bowel: Colonic diverticular disease without CT evidence of active inflammation. Right femoral hernia containing a loop of small bowel and a small amount of ascitic fluid. Smaller left-sided femoral hernia containing a single knuckle of small bowel. No evidence of incarceration or bowel inflammation. Vascular/Lymphatic: Atherosclerotic calcifications throughout the abdominal aorta. No aneurysm. No suspicious adenopathy. Reproductive: No adnexal mass. Calcified degenerated uterine fibroids. Other: Bilateral femoral hernias larger on the right than the left as described above. Mild perihepatic ascites. Musculoskeletal: No acute fracture or aggressive appearing lytic or blastic osseous lesion. Stable grade 1 anterolisthesis of L5 on S1. IMPRESSION: 1. Subtle morphologic changes of hepatic cirrhosis. Additionally, the hepatic parenchyma is relatively hyperdense to the spleen. Given the patient's history of chronic anemia, this could represent secondary hemochromatosis if she has undergone multiple prior transfusions. 2. Enlarged portal veins and trace perihepatic ascites suggests underlying portal hypertension. The patency of the portal veins cannot be assessed in the absence of intravenous contrast. If there is clinical concern for portal venous thrombosis, liver Doppler ultrasound could be considered. 3. Cardiomegaly with left heart enlargement. 4. Nonspecific patchy opacities in both lung bases raise the possibility of an active infectious/inflammatory process. 5. The questioned gallbladder  lesion on the prior ultrasound is not visualized in the absence of intravenous contrast. 6. Enlarging low-attenuation right adrenal lesion compared to 08/29/2014. This likely represents a benign adenoma or myelolipoma. A collision tumor is a less  likely possibility of the patient has a known diagnosis of malignancy. 7. Nonobstructing bilateral lower pole nephrolithiasis. 8. Bilateral right larger than left femoral hernias containing small bowel without evidence of obstruction or incarceration. 9. Colonic diverticular disease without CT evidence of active inflammation. 10.  Aortic Atherosclerosis (ICD10-170.0) 11. Stable grade 1 anterolisthesis of L5 on S1. Electronically Signed   By: Jacqulynn Cadet M.D.   On: 06/17/2017 14:23    Scheduled Meds: . amiodarone  200 mg Oral Daily  . atorvastatin  40 mg Oral q1800  . carvedilol  6.25 mg Oral BID WC  . furosemide  40 mg Oral Daily  . sodium chloride flush  3 mL Intravenous Q12H   Continuous Infusions: . sodium chloride    . sodium chloride      Principal Problem:   Acute GI bleeding Active Problems:   Nonischemic cardiomyopathy (HCC)   PAF (paroxysmal atrial fibrillation) (HCC)   CKD (chronic kidney disease), stage III (HCC)   Symptomatic anemia   Chronic anticoagulation   Time spent: 25 minutes   [facility] Pager [phone]. If 7PM-7AM, please contact night-coverage at [email or web address], password [facility] 06/17/2017, 3:54 PM  LOS: 0 days

## 2017-06-17 NOTE — H&P (View-Only) (Signed)
Subjective: The patient was seen and examined at bedside. She complains of nausea but denies vomiting. Denies abdominal pain. Reports having a bowel movement but is not sure whether it was black or bloody, was supposed to start iron as an outpatient.  Objective: Vital signs in last 24 hours: Temp:  [97.9 F (36.6 C)-98.5 F (36.9 C)] 98.5 F (36.9 C) (12/22 0509) Pulse Rate:  [55-64] 64 (12/22 0509) Resp:  [16-18] 17 (12/22 0509) BP: (107-137)/(48-55) 130/48 (12/22 0509) SpO2:  [92 %-97 %] 92 % (12/22 0509) Weight change:  Last BM Date: 06/17/17(per pt; she did not look at the color of it)  PE:not in distress GENERAL:mild pallor, no icterus ABDOMEN:soft, non distended, non tender, normoactive bowel sounds EXTREMITIES:no edema, no deformity  Lab Results: Results for orders placed or performed during the hospital encounter of 06/15/17 (from the past 48 hour(s))  Comprehensive metabolic panel     Status: Abnormal   Collection Time: 06/15/17  5:35 PM  Result Value Ref Range   Sodium 138 135 - 145 mmol/L   Potassium 3.8 3.5 - 5.1 mmol/L   Chloride 108 101 - 111 mmol/L   CO2 23 22 - 32 mmol/L   Glucose, Bld 99 65 - 99 mg/dL   BUN 34 (H) 6 - 20 mg/dL   Creatinine, Ser 2.11 (H) 0.44 - 1.00 mg/dL   Calcium 8.6 (L) 8.9 - 10.3 mg/dL   Total Protein 7.4 6.5 - 8.1 g/dL   Albumin 2.7 (L) 3.5 - 5.0 g/dL   AST 62 (H) 15 - 41 U/L   ALT 21 14 - 54 U/L   Alkaline Phosphatase 112 38 - 126 U/L   Total Bilirubin 1.3 (H) 0.3 - 1.2 mg/dL   GFR calc non Af Amer 22 (L) >60 mL/min   GFR calc Af Amer 26 (L) >60 mL/min    Comment: (NOTE) The eGFR has been calculated using the CKD EPI equation. This calculation has not been validated in all clinical situations. eGFR's persistently <60 mL/min signify possible Chronic Kidney Disease.    Anion gap 7 5 - 15  CBC     Status: Abnormal   Collection Time: 06/15/17  5:35 PM  Result Value Ref Range   WBC 7.4 4.0 - 10.5 K/uL   RBC 3.06 (L) 3.87 - 5.11  MIL/uL   Hemoglobin 6.3 (LL) 12.0 - 15.0 g/dL    Comment: REPEATED TO VERIFY CRITICAL RESULT CALLED TO, READ BACK BY AND VERIFIED WITH: H HALL,RN 1802 06/15/17 D BRADLEY    HCT 20.1 (L) 36.0 - 46.0 %   MCV 65.7 (L) 78.0 - 100.0 fL   MCH 20.6 (L) 26.0 - 34.0 pg   MCHC 31.3 30.0 - 36.0 g/dL   RDW 17.9 (H) 11.5 - 15.5 %   Platelets 292 150 - 400 K/uL  Type and screen [facility]     Status: None   Collection Time: 06/15/17  5:40 PM  Result Value Ref Range   ABO/RH(D) O POS    Antibody Screen POS    Sample Expiration 06/18/2017    DAT, IgG NEG    Antibody Identification ANTI E    PT AG Type NEGATIVE FOR E ANTIGEN    Unit Number [id number]    Blood Component Type RED CELLS,LR    Unit division 00    Status of Unit ISSUED,FINAL    Donor AG Type NEGATIVE FOR E ANTIGEN    Transfusion Status OK TO TRANSFUSE    Crossmatch Result   COMPATIBLE    Unit Number [id number]    Blood Component Type RED CELLS,LR    Unit division 00    Status of Unit ISSUED,FINAL    Donor AG Type NEGATIVE FOR E ANTIGEN    Transfusion Status OK TO TRANSFUSE    Crossmatch Result COMPATIBLE    Unit Number [id number]    Blood Component Type RED CELLS,LR    Unit division 00    Status of Unit ISSUED,FINAL    Donor AG Type NEGATIVE FOR E ANTIGEN    Transfusion Status OK TO TRANSFUSE    Crossmatch Result COMPATIBLE   POC occult blood, ED     Status: Abnormal   Collection Time: 06/15/17  6:53 PM  Result Value Ref Range   Fecal Occult Bld POSITIVE (A) NEGATIVE  Prepare RBC     Status: None   Collection Time: 06/15/17  7:00 PM  Result Value Ref Range   Order Confirmation ORDER PROCESSED BY BLOOD BANK   Brain natriuretic peptide     Status: Abnormal   Collection Time: 06/15/17  7:02 PM  Result Value Ref Range   B Natriuretic Peptide 594.9 (H) 0.0 - 100.0 pg/mL  Vitamin B12     Status: None   Collection Time: 06/15/17  7:02 PM  Result Value Ref Range   Vitamin B-12 741 180 - 914 pg/mL     Comment: (NOTE) This assay is not validated for testing neonatal or myeloproliferative syndrome specimens for Vitamin B12 levels.   Folate     Status: None   Collection Time: 06/15/17  7:02 PM  Result Value Ref Range   Folate 17.0 >5.9 ng/mL  Iron and TIBC     Status: Abnormal   Collection Time: 06/15/17  7:02 PM  Result Value Ref Range   Iron 22 (L) 28 - 170 ug/dL   TIBC 470 (H) 250 - 450 ug/dL   Saturation Ratios 5 (L) 10.4 - 31.8 %   UIBC 448 ug/dL  Ferritin     Status: None   Collection Time: 06/15/17  7:02 PM  Result Value Ref Range   Ferritin 14 11 - 307 ng/mL  Reticulocytes     Status: Abnormal   Collection Time: 06/15/17  7:02 PM  Result Value Ref Range   Retic Ct Pct 1.2 0.4 - 3.1 %   RBC. 3.24 (L) 3.87 - 5.11 MIL/uL   Retic Count, Absolute 38.9 19.0 - 186.0 K/uL  I-stat troponin, ED     Status: None   Collection Time: 06/15/17  7:23 PM  Result Value Ref Range   Troponin i, poc 0.03 0.00 - 0.08 ng/mL   Comment 3            Comment: Due to the release kinetics of cTnI, a negative result within the first hours of the onset of symptoms does not rule out myocardial infarction with certainty. If myocardial infarction is still suspected, repeat the test at appropriate intervals.   Basic metabolic panel     Status: Abnormal   Collection Time: 06/16/17  4:50 AM  Result Value Ref Range   Sodium 139 135 - 145 mmol/L   Potassium 3.7 3.5 - 5.1 mmol/L   Chloride 109 101 - 111 mmol/L   CO2 22 22 - 32 mmol/L   Glucose, Bld 75 65 - 99 mg/dL   BUN 32 (H) 6 - 20 mg/dL   Creatinine, Ser 1.87 (H) 0.44 - 1.00 mg/dL   Calcium 8.5 (L) 8.9 - 10.3 mg/dL  GFR calc non Af Amer 26 (L) >60 mL/min   GFR calc Af Amer 30 (L) >60 mL/min    Comment: (NOTE) The eGFR has been calculated using the CKD EPI equation. This calculation has not been validated in all clinical situations. eGFR's persistently <60 mL/min signify possible Chronic Kidney Disease.    Anion gap 8 5 - 15  CBC      Status: Abnormal   Collection Time: 06/16/17  4:50 AM  Result Value Ref Range   WBC 5.7 4.0 - 10.5 K/uL   RBC 3.25 (L) 3.87 - 5.11 MIL/uL   Hemoglobin 7.3 (L) 12.0 - 15.0 g/dL   HCT 22.6 (L) 36.0 - 46.0 %   MCV 69.5 (L) 78.0 - 100.0 fL   MCH 22.5 (L) 26.0 - 34.0 pg   MCHC 32.3 30.0 - 36.0 g/dL   RDW 19.9 (H) 11.5 - 15.5 %   Platelets 217 150 - 400 K/uL    Comment: CONSISTENT WITH PREVIOUS RESULT  Prepare RBC     Status: None   Collection Time: 06/16/17  7:56 AM  Result Value Ref Range   Order Confirmation ORDER PROCESSED BY BLOOD BANK   CBC     Status: Abnormal   Collection Time: 06/17/17  6:25 AM  Result Value Ref Range   WBC 5.7 4.0 - 10.5 K/uL   RBC 3.83 (L) 3.87 - 5.11 MIL/uL   Hemoglobin 9.0 (L) 12.0 - 15.0 g/dL   HCT 26.9 (L) 36.0 - 46.0 %   MCV 70.2 (L) 78.0 - 100.0 fL   MCH 23.5 (L) 26.0 - 34.0 pg   MCHC 33.5 30.0 - 36.0 g/dL   RDW 20.6 (H) 11.5 - 15.5 %   Platelets 189 150 - 400 K/uL    Studies/Results: Ct Abdomen Pelvis Wo Contrast  Result Date: 06/17/2017 CLINICAL DATA:  71-year-old female with abdominal discomfort and history of recent abnormal ultrasound (masslike region near the fundus of the gallbladder). EXAM: CT ABDOMEN AND PELVIS WITHOUT CONTRAST TECHNIQUE: Multidetector CT imaging of the abdomen and pelvis was performed following the standard protocol without IV contrast. COMPARISON:  Abdominal ultrasound 04/05/2017 ; prior CT scan of the abdomen and pelvis 08/29/2014 FINDINGS: Lower chest: Cardiomegaly with left heart enlargement. A cardiac rhythm maintenance device is partially imaged. Leads project over the right ventricle. No pericardial effusion. Small hiatal hernia. Nonspecific patchy opacities in both lung bases raise the possibility of an active infectious/ inflammatory process. There is also mild dependent atelectasis. Hepatobiliary: Subtle nodularity of the hepatic contour and the liver is relatively hyperdense compared to the spleen. The portal vein  appears slightly engorged. There is trace perihepatic ascites. The gallbladder is somewhat poorly imaged in the absence of intravenous contrast. No obvious radiopaque stones or biliary ductal dilatation. Pancreas: Unremarkable. No pancreatic ductal dilatation or surrounding inflammatory changes. Spleen: Normal in size without focal abnormality. Adrenals/Urinary Tract: Slightly enlarged low-attenuation lesion exophytic from the right adrenal gland now measuring 3.2 by 1.8 cm. This likely represents a benign hemangioma, or myelolipoma. The left adrenal gland remains unchanged. No evidence of hydronephrosis or renal edema. Nonobstructing 5 mm stone in the lower pole of the right kidney. Punctate stone in the lower pole of the left kidney. The bladder is unremarkable. Stomach/Bowel: Colonic diverticular disease without CT evidence of active inflammation. Right femoral hernia containing a loop of small bowel and a small amount of ascitic fluid. Smaller left-sided femoral hernia containing a single knuckle of small bowel. No evidence of incarceration or bowel   inflammation. Vascular/Lymphatic: Atherosclerotic calcifications throughout the abdominal aorta. No aneurysm. No suspicious adenopathy. Reproductive: No adnexal mass. Calcified degenerated uterine fibroids. Other: Bilateral femoral hernias larger on the right than the left as described above. Mild perihepatic ascites. Musculoskeletal: No acute fracture or aggressive appearing lytic or blastic osseous lesion. Stable grade 1 anterolisthesis of L5 on S1. IMPRESSION: 1. Subtle morphologic changes of hepatic cirrhosis. Additionally, the hepatic parenchyma is relatively hyperdense to the spleen. Given the patient's history of chronic anemia, this could represent secondary hemochromatosis if she has undergone multiple prior transfusions. 2. Enlarged portal veins and trace perihepatic ascites suggests underlying portal hypertension. The patency of the portal veins cannot be  assessed in the absence of intravenous contrast. If there is clinical concern for portal venous thrombosis, liver Doppler ultrasound could be considered. 3. Cardiomegaly with left heart enlargement. 4. Nonspecific patchy opacities in both lung bases raise the possibility of an active infectious/inflammatory process. 5. The questioned gallbladder lesion on the prior ultrasound is not visualized in the absence of intravenous contrast. 6. Enlarging low-attenuation right adrenal lesion compared to 08/29/2014. This likely represents a benign adenoma or myelolipoma. A collision tumor is a less likely possibility of the patient has a known diagnosis of malignancy. 7. Nonobstructing bilateral lower pole nephrolithiasis. 8. Bilateral right larger than left femoral hernias containing small bowel without evidence of obstruction or incarceration. 9. Colonic diverticular disease without CT evidence of active inflammation. 10.  Aortic Atherosclerosis (ICD10-170.0) 11. Stable grade 1 anterolisthesis of L5 on S1. Electronically Signed   By: Heath  McCullough M.D.   On: 06/17/2017 14:23    Medications: I have reviewed the patient's current medications.  Assessment: 1.Anemia,hemoglobin 6.3 on admission,3 units PRBC transfusion on 06/16/17,current hemoglobin 9 Microcytosis( MCV 65.7), elevated RDW, iron saturation 5% TIBC 470 compatible with severe iron deficiency, ferritin 14.  2.Renal impairment BUN 32/creatinine 1.87/GFR 30  3.Hepatic cirrhosis,portal hypertension on recent CAT scan,Total bili 1.3/ALP 112/AST 62/AST 21, Will get PT/INR in am to calculate MELD Na score.  4.Previously noted gallbladder mass not appreciated on CAT scan without contrast  5.Colonic diverticulosis 6.Right adrenal adenoma/myelolipoma 7.Chronic kidney disease/hypertension/nonischemic cardiomyopathy-defibrillator/pacemaker/congestive heart failure, ejection fraction 10-15%, history of sinus bradycardia and ventricular  tachycardia  Plan: 1.Discussed findings of CAT scan showing cirrhosis of liver with patient. Will send for workup for chronic liver disease(HBsAg, HCV Ab, Ceruloplasmin, Alpha 1 anti trypsin, AMA, ASMA). Hemachromatosis unlikely with low iron saturation and low ferritin.  2. Eliquis on hold, plan EGD?esophageal varices/gastric varices/hypertensive gastropathy or duodenopathy causing anemia. Depending upon anesthesia availability EGD either tomorrow or on Monday. Will keep nothing by mouth post midnight.  3. CAT scan without IV contrast did not show gallbladder mass noted on ultrasound from October. We will get an ultrasound for reevaluation of possible gallbladder mass.   Kahne Helfand 06/17/2017, 2:43 PM   Pager [phone] If no answer or after 5 PM call [phone] 

## 2017-06-17 NOTE — Progress Notes (Signed)
Subjective: The patient was seen and examined at bedside. She complains of nausea but denies vomiting. Denies abdominal pain. Reports having a bowel movement but is not sure whether it was black or bloody, was supposed to start iron as an outpatient.  Objective: Vital signs in last 24 hours: Temp:  [97.9 F (36.6 C)-98.5 F (36.9 C)] 98.5 F (36.9 C) (12/22 0509) Pulse Rate:  [55-64] 64 (12/22 0509) Resp:  [16-18] 17 (12/22 0509) BP: (107-137)/(48-55) 130/48 (12/22 0509) SpO2:  [92 %-97 %] 92 % (12/22 0509) Weight change:  Last BM Date: 06/17/17(per pt; she did not look at the color of it)  PE:not in distress GENERAL:mild pallor, no icterus ABDOMEN:soft, non distended, non tender, normoactive bowel sounds EXTREMITIES:no edema, no deformity  Lab Results: Results for orders placed or performed during the hospital encounter of 06/15/17 (from the past 48 hour(s))  Comprehensive metabolic panel     Status: Abnormal   Collection Time: 06/15/17  5:35 PM  Result Value Ref Range   Sodium 138 135 - 145 mmol/L   Potassium 3.8 3.5 - 5.1 mmol/L   Chloride 108 101 - 111 mmol/L   CO2 23 22 - 32 mmol/L   Glucose, Bld 99 65 - 99 mg/dL   BUN 34 (H) 6 - 20 mg/dL   Creatinine, Ser 2.11 (H) 0.44 - 1.00 mg/dL   Calcium 8.6 (L) 8.9 - 10.3 mg/dL   Total Protein 7.4 6.5 - 8.1 g/dL   Albumin 2.7 (L) 3.5 - 5.0 g/dL   AST 62 (H) 15 - 41 U/L   ALT 21 14 - 54 U/L   Alkaline Phosphatase 112 38 - 126 U/L   Total Bilirubin 1.3 (H) 0.3 - 1.2 mg/dL   GFR calc non Af Amer 22 (L) >60 mL/min   GFR calc Af Amer 26 (L) >60 mL/min    Comment: (NOTE) The eGFR has been calculated using the CKD EPI equation. This calculation has not been validated in all clinical situations. eGFR's persistently <60 mL/min signify possible Chronic Kidney Disease.    Anion gap 7 5 - 15  CBC     Status: Abnormal   Collection Time: 06/15/17  5:35 PM  Result Value Ref Range   WBC 7.4 4.0 - 10.5 K/uL   RBC 3.06 (L) 3.87 - 5.11  MIL/uL   Hemoglobin 6.3 (LL) 12.0 - 15.0 g/dL    Comment: REPEATED TO VERIFY CRITICAL RESULT CALLED TO, READ BACK BY AND VERIFIED WITH: H HALL,RN 1802 06/15/17 D BRADLEY    HCT 20.1 (L) 36.0 - 46.0 %   MCV 65.7 (L) 78.0 - 100.0 fL   MCH 20.6 (L) 26.0 - 34.0 pg   MCHC 31.3 30.0 - 36.0 g/dL   RDW 17.9 (H) 11.5 - 15.5 %   Platelets 292 150 - 400 K/uL  Type and screen [place]     Status: None   Collection Time: 06/15/17  5:40 PM  Result Value Ref Range   ABO/RH(D) O POS    Antibody Screen POS    Sample Expiration 06/18/2017    DAT, IgG NEG    Antibody Identification ANTI E    PT AG Type NEGATIVE FOR E ANTIGEN    Unit Number [id number]    Blood Component Type RED CELLS,LR    Unit division 00    Status of Unit ISSUED,FINAL    Donor AG Type NEGATIVE FOR E ANTIGEN    Transfusion Status OK TO TRANSFUSE    Crossmatch Result  COMPATIBLE    Unit Number [id number]    Blood Component Type RED CELLS,LR    Unit division 00    Status of Unit ISSUED,FINAL    Donor AG Type NEGATIVE FOR E ANTIGEN    Transfusion Status OK TO TRANSFUSE    Crossmatch Result COMPATIBLE    Unit Number [id number]    Blood Component Type RED CELLS,LR    Unit division 00    Status of Unit ISSUED,FINAL    Donor AG Type NEGATIVE FOR E ANTIGEN    Transfusion Status OK TO TRANSFUSE    Crossmatch Result COMPATIBLE   POC occult blood, ED     Status: Abnormal   Collection Time: 06/15/17  6:53 PM  Result Value Ref Range   Fecal Occult Bld POSITIVE (A) NEGATIVE  Prepare RBC     Status: None   Collection Time: 06/15/17  7:00 PM  Result Value Ref Range   Order Confirmation ORDER PROCESSED BY BLOOD BANK   Brain natriuretic peptide     Status: Abnormal   Collection Time: 06/15/17  7:02 PM  Result Value Ref Range   B Natriuretic Peptide 594.9 (H) 0.0 - 100.0 pg/mL  Vitamin B12     Status: None   Collection Time: 06/15/17  7:02 PM  Result Value Ref Range   Vitamin B-12 741 180 - 914 pg/mL     Comment: (NOTE) This assay is not validated for testing neonatal or myeloproliferative syndrome specimens for Vitamin B12 levels.   Folate     Status: None   Collection Time: 06/15/17  7:02 PM  Result Value Ref Range   Folate 17.0 >5.9 ng/mL  Iron and TIBC     Status: Abnormal   Collection Time: 06/15/17  7:02 PM  Result Value Ref Range   Iron 22 (L) 28 - 170 ug/dL   TIBC 470 (H) 250 - 450 ug/dL   Saturation Ratios 5 (L) 10.4 - 31.8 %   UIBC 448 ug/dL  Ferritin     Status: None   Collection Time: 06/15/17  7:02 PM  Result Value Ref Range   Ferritin 14 11 - 307 ng/mL  Reticulocytes     Status: Abnormal   Collection Time: 06/15/17  7:02 PM  Result Value Ref Range   Retic Ct Pct 1.2 0.4 - 3.1 %   RBC. 3.24 (L) 3.87 - 5.11 MIL/uL   Retic Count, Absolute 38.9 19.0 - 186.0 K/uL  I-stat troponin, ED     Status: None   Collection Time: 06/15/17  7:23 PM  Result Value Ref Range   Troponin i, poc 0.03 0.00 - 0.08 ng/mL   Comment 3            Comment: Due to the release kinetics of cTnI, a negative result within the first hours of the onset of symptoms does not rule out myocardial infarction with certainty. If myocardial infarction is still suspected, repeat the test at appropriate intervals.   Basic metabolic panel     Status: Abnormal   Collection Time: 06/16/17  4:50 AM  Result Value Ref Range   Sodium 139 135 - 145 mmol/L   Potassium 3.7 3.5 - 5.1 mmol/L   Chloride 109 101 - 111 mmol/L   CO2 22 22 - 32 mmol/L   Glucose, Bld 75 65 - 99 mg/dL   BUN 32 (H) 6 - 20 mg/dL   Creatinine, Ser 1.87 (H) 0.44 - 1.00 mg/dL   Calcium 8.5 (L) 8.9 - 10.3 mg/dL  GFR calc non Af Amer 26 (L) >60 mL/min   GFR calc Af Amer 30 (L) >60 mL/min    Comment: (NOTE) The eGFR has been calculated using the CKD EPI equation. This calculation has not been validated in all clinical situations. eGFR's persistently <60 mL/min signify possible Chronic Kidney Disease.    Anion gap 8 5 - 15  CBC      Status: Abnormal   Collection Time: 06/16/17  4:50 AM  Result Value Ref Range   WBC 5.7 4.0 - 10.5 K/uL   RBC 3.25 (L) 3.87 - 5.11 MIL/uL   Hemoglobin 7.3 (L) 12.0 - 15.0 g/dL   HCT 22.6 (L) 36.0 - 46.0 %   MCV 69.5 (L) 78.0 - 100.0 fL   MCH 22.5 (L) 26.0 - 34.0 pg   MCHC 32.3 30.0 - 36.0 g/dL   RDW 19.9 (H) 11.5 - 15.5 %   Platelets 217 150 - 400 K/uL    Comment: CONSISTENT WITH PREVIOUS RESULT  Prepare RBC     Status: None   Collection Time: 06/16/17  7:56 AM  Result Value Ref Range   Order Confirmation ORDER PROCESSED BY BLOOD BANK   CBC     Status: Abnormal   Collection Time: 06/17/17  6:25 AM  Result Value Ref Range   WBC 5.7 4.0 - 10.5 K/uL   RBC 3.83 (L) 3.87 - 5.11 MIL/uL   Hemoglobin 9.0 (L) 12.0 - 15.0 g/dL   HCT 26.9 (L) 36.0 - 46.0 %   MCV 70.2 (L) 78.0 - 100.0 fL   MCH 23.5 (L) 26.0 - 34.0 pg   MCHC 33.5 30.0 - 36.0 g/dL   RDW 20.6 (H) 11.5 - 15.5 %   Platelets 189 150 - 400 K/uL    Studies/Results: Ct Abdomen Pelvis Wo Contrast  Result Date: 06/17/2017 CLINICAL DATA:  71 year old female with abdominal discomfort and history of recent abnormal ultrasound (masslike region near the fundus of the gallbladder). EXAM: CT ABDOMEN AND PELVIS WITHOUT CONTRAST TECHNIQUE: Multidetector CT imaging of the abdomen and pelvis was performed following the standard protocol without IV contrast. COMPARISON:  Abdominal ultrasound 04/05/2017 ; prior CT scan of the abdomen and pelvis 08/29/2014 FINDINGS: Lower chest: Cardiomegaly with left heart enlargement. A cardiac rhythm maintenance device is partially imaged. Leads project over the right ventricle. No pericardial effusion. Small hiatal hernia. Nonspecific patchy opacities in both lung bases raise the possibility of an active infectious/ inflammatory process. There is also mild dependent atelectasis. Hepatobiliary: Subtle nodularity of the hepatic contour and the liver is relatively hyperdense compared to the spleen. The portal vein  appears slightly engorged. There is trace perihepatic ascites. The gallbladder is somewhat poorly imaged in the absence of intravenous contrast. No obvious radiopaque stones or biliary ductal dilatation. Pancreas: Unremarkable. No pancreatic ductal dilatation or surrounding inflammatory changes. Spleen: Normal in size without focal abnormality. Adrenals/Urinary Tract: Slightly enlarged low-attenuation lesion exophytic from the right adrenal gland now measuring 3.2 by 1.8 cm. This likely represents a benign hemangioma, or myelolipoma. The left adrenal gland remains unchanged. No evidence of hydronephrosis or renal edema. Nonobstructing 5 mm stone in the lower pole of the right kidney. Punctate stone in the lower pole of the left kidney. The bladder is unremarkable. Stomach/Bowel: Colonic diverticular disease without CT evidence of active inflammation. Right femoral hernia containing a loop of small bowel and a small amount of ascitic fluid. Smaller left-sided femoral hernia containing a single knuckle of small bowel. No evidence of incarceration or bowel  inflammation. Vascular/Lymphatic: Atherosclerotic calcifications throughout the abdominal aorta. No aneurysm. No suspicious adenopathy. Reproductive: No adnexal mass. Calcified degenerated uterine fibroids. Other: Bilateral femoral hernias larger on the right than the left as described above. Mild perihepatic ascites. Musculoskeletal: No acute fracture or aggressive appearing lytic or blastic osseous lesion. Stable grade 1 anterolisthesis of L5 on S1. IMPRESSION: 1. Subtle morphologic changes of hepatic cirrhosis. Additionally, the hepatic parenchyma is relatively hyperdense to the spleen. Given the patient's history of chronic anemia, this could represent secondary hemochromatosis if she has undergone multiple prior transfusions. 2. Enlarged portal veins and trace perihepatic ascites suggests underlying portal hypertension. The patency of the portal veins cannot be  assessed in the absence of intravenous contrast. If there is clinical concern for portal venous thrombosis, liver Doppler ultrasound could be considered. 3. Cardiomegaly with left heart enlargement. 4. Nonspecific patchy opacities in both lung bases raise the possibility of an active infectious/inflammatory process. 5. The questioned gallbladder lesion on the prior ultrasound is not visualized in the absence of intravenous contrast. 6. Enlarging low-attenuation right adrenal lesion compared to 08/29/2014. This likely represents a benign adenoma or myelolipoma. A collision tumor is a less likely possibility of the patient has a known diagnosis of malignancy. 7. Nonobstructing bilateral lower pole nephrolithiasis. 8. Bilateral right larger than left femoral hernias containing small bowel without evidence of obstruction or incarceration. 9. Colonic diverticular disease without CT evidence of active inflammation. 10.  Aortic Atherosclerosis (ICD10-170.0) 11. Stable grade 1 anterolisthesis of L5 on S1. Electronically Signed   By: Jacqulynn Cadet M.D.   On: 06/17/2017 14:23    Medications: I have reviewed the patient's current medications.  Assessment: 1.Anemia,hemoglobin 6.3 on admission,3 units PRBC transfusion on 06/16/17,current hemoglobin 9 Microcytosis( MCV 65.7), elevated RDW, iron saturation 5% TIBC 470 compatible with severe iron deficiency, ferritin 14.  2.Renal impairment BUN 32/creatinine 1.87/GFR 30  3.Hepatic cirrhosis,portal hypertension on recent CAT scan,Total bili 1.3/ALP 112/AST 62/AST 21, Will get PT/INR in am to calculate MELD Na score.  4.Previously noted gallbladder mass not appreciated on CAT scan without contrast  5.Colonic diverticulosis 6.Right adrenal adenoma/myelolipoma 7.Chronic kidney disease/hypertension/nonischemic cardiomyopathy-defibrillator/pacemaker/congestive heart failure, ejection fraction 10-15%, history of sinus bradycardia and ventricular  tachycardia  Plan: 1.Discussed findings of CAT scan showing cirrhosis of liver with patient. Will send for workup for chronic liver disease(HBsAg, HCV Ab, Ceruloplasmin, Alpha 1 anti trypsin, AMA, ASMA). Hemachromatosis unlikely with low iron saturation and low ferritin.  2. Eliquis on hold, plan EGD?esophageal varices/gastric varices/hypertensive gastropathy or duodenopathy causing anemia. Depending upon anesthesia availability EGD either tomorrow or on Monday. Will keep nothing by mouth post midnight.  3. CAT scan without IV contrast did not show gallbladder mass noted on ultrasound from October. We will get an ultrasound for reevaluation of possible gallbladder mass.   Ronnette Juniper 06/17/2017, 2:43 PM   Pager [phone] If no answer or after 5 PM call [phone]

## 2017-06-18 ENCOUNTER — Observation Stay (HOSPITAL_COMMUNITY): Payer: Medicare Other | Admitting: Anesthesiology

## 2017-06-18 ENCOUNTER — Encounter (HOSPITAL_COMMUNITY): Payer: Self-pay | Admitting: *Deleted

## 2017-06-18 ENCOUNTER — Observation Stay (HOSPITAL_COMMUNITY): Payer: Medicare Other

## 2017-06-18 ENCOUNTER — Encounter (HOSPITAL_COMMUNITY)
Admission: EM | Disposition: A | Discharge status: Home / Self Care | Payer: Self-pay | Source: Home / Self Care | Attending: Emergency Medicine

## 2017-06-18 DIAGNOSIS — K317 Polyp of stomach and duodenum: Secondary | ICD-10-CM | POA: Diagnosis not present

## 2017-06-18 DIAGNOSIS — K922 Gastrointestinal hemorrhage, unspecified: Secondary | ICD-10-CM | POA: Diagnosis not present

## 2017-06-18 DIAGNOSIS — D509 Iron deficiency anemia, unspecified: Secondary | ICD-10-CM | POA: Diagnosis not present

## 2017-06-18 DIAGNOSIS — K259 Gastric ulcer, unspecified as acute or chronic, without hemorrhage or perforation: Secondary | ICD-10-CM | POA: Diagnosis not present

## 2017-06-18 DIAGNOSIS — K921 Melena: Secondary | ICD-10-CM | POA: Diagnosis not present

## 2017-06-18 HISTORY — PX: ESOPHAGOGASTRODUODENOSCOPY (EGD) WITH PROPOFOL: SHX5813

## 2017-06-18 LAB — HEPATITIS C ANTIBODY: HCV Ab: 0.1 s/co ratio (ref 0.0–0.9)

## 2017-06-18 LAB — HEPATITIS B SURFACE ANTIGEN: Hepatitis B Surface Ag: NEGATIVE

## 2017-06-18 LAB — CERULOPLASMIN: Ceruloplasmin: 23.8 mg/dL (ref 19.0–39.0)

## 2017-06-18 LAB — ALPHA-1-ANTITRYPSIN: A-1 Antitrypsin, Ser: 142 mg/dL (ref 90–200)

## 2017-06-18 LAB — PROTIME-INR
INR: 1.65
Prothrombin Time: 19.4 seconds — ABNORMAL HIGH (ref 11.4–15.2)

## 2017-06-18 SURGERY — EGD (ESOPHAGOGASTRODUODENOSCOPY)
Anesthesia: Monitor Anesthesia Care

## 2017-06-18 SURGERY — ESOPHAGOGASTRODUODENOSCOPY (EGD) WITH PROPOFOL
Anesthesia: Monitor Anesthesia Care

## 2017-06-18 MED ORDER — PROPOFOL 10 MG/ML IV BOLUS
INTRAVENOUS | Status: DC | PRN
  Administered 2017-06-18 (×2): 10 mg via INTRAVENOUS

## 2017-06-18 MED ORDER — LIDOCAINE HCL (CARDIAC) 20 MG/ML IV SOLN
INTRAVENOUS | Status: DC | PRN
  Administered 2017-06-18: 40 mg via INTRAVENOUS

## 2017-06-18 MED ORDER — SODIUM CHLORIDE 0.9 % IV SOLN: INTRAVENOUS | Status: DC

## 2017-06-18 MED ORDER — PROPOFOL 500 MG/50ML IV EMUL
INTRAVENOUS | Status: DC | PRN
  Administered 2017-06-18: 75 ug/kg/min via INTRAVENOUS

## 2017-06-18 MED ORDER — PANTOPRAZOLE SODIUM 40 MG PO TBEC: 40.0000 mg | DELAYED_RELEASE_TABLET | Freq: Two times a day (BID) | ORAL | 0 refills | Status: DC

## 2017-06-18 MED ORDER — PANTOPRAZOLE SODIUM 40 MG PO TBEC
40.0000 mg | DELAYED_RELEASE_TABLET | Freq: Two times a day (BID) | ORAL | Status: DC
  Administered 2017-06-18 (×2): 40 mg via ORAL
  Filled 2017-06-18 (×2): qty 1

## 2017-06-18 MED ORDER — LACTATED RINGERS IV SOLN
INTRAVENOUS | Status: DC | PRN
  Administered 2017-06-18: 08:00:00 via INTRAVENOUS

## 2017-06-18 MED ORDER — PHENYLEPHRINE HCL 10 MG/ML IJ SOLN
INTRAVENOUS | Status: DC | PRN
  Administered 2017-06-18: 10 ug/min via INTRAVENOUS

## 2017-06-18 NOTE — Transfer of Care (Signed)
Immediate Anesthesia Transfer of Care Note  Patient: Stephanie Yates  Procedure(s) Performed: ESOPHAGOGASTRODUODENOSCOPY (EGD) WITH PROPOFOL (N/A )  Patient Location: PACU  Anesthesia Type:General  Level of Consciousness: awake, alert , oriented and patient cooperative  Airway & Oxygen Therapy: Patient Spontanous Breathing and Patient connected to nasal cannula oxygen  Post-op Assessment: Report given to RN and Post -op Vital signs reviewed and stable  Post vital signs: Reviewed and stable  Last Vitals:  Vitals:   06/18/17 0705 06/18/17 0801  BP: (!) 145/53 (!) 128/57  Pulse: (!) 58 (!) 56  Resp: 18 18  Temp: 36.6 C 36.6 C  SpO2: 97% 97%    Last Pain:  Vitals:   06/18/17 0801  TempSrc: Oral  PainSc:          Complications: No apparent anesthesia complications

## 2017-06-18 NOTE — Interval H&P Note (Signed)
History and Physical Interval Note:  71/female, newly diagnosed cirrhosis, presents with anemia, has needed 3 units PRBC transfusion, Eliquis on hold for 3rd day. 06/18/2017 7:30 AM  Stephanie Yates  has presented today for EGD with banding if needed, with the diagnosis of GI bleed  The various methods of treatment have been discussed with the patient and family. After consideration of risks, benefits and other options for treatment, the patient has consented to  Procedure(s): ESOPHAGOGASTRODUODENOSCOPY (EGD) WITH PROPOFOL (N/A) as a surgical intervention .  The patient's history has been reviewed, patient examined, no change in status, stable for surgery.  I have reviewed the patient's chart and labs.  Questions were answered to the patient's satisfaction.     Ronnette Juniper

## 2017-06-18 NOTE — Op Note (Signed)
EGD was performed for anemia requiring 3 units of PRBC transfusion, diagnosed cirrhosis on CAT scan.  Esophagus appeared unremarkable, no varices noted, Z line regular at 35 cm from insertion. Few erythematous gastric polyps noted, biopsies taken. Retroflexion unremarkable, no gastric varices noted. Superficial antral and prepyloric ulcers, minimal oozing noted, treated with APC to control oozing. A 1.2 cm sessile polypoid mass noted in duodenal bulb, removed via hot snare polypectomy. Rest of duodenum appeared unremarkable.   Recommendation: Start regular diet. Protonix twice a day for 2 weeks, then indefinitely as long as anticoagulation/Eliquis is continued. Follow up pathology as an outpatient. Okay to resume Eliquis in a.m.  Patient had a gallbladder mass noted on ultrasound from 04/05/17. CAT scan performed yesterday without contrast did not show the mass. Repeat ultrasound ordered for reevaluation of gallbladder mass, if present, then surgical evaluation. Workup for etiology of cirrhosis of liver sent(not compatible with hemachromatosis, other workup- AMA/ASMA/Alpha 1 antitrypsin, HBsAg, HCV Ab- pending) MELD na is around 19.   Ronnette Juniper, M.D.

## 2017-06-18 NOTE — Op Note (Signed)
[facility] Patient Name: Stephanie Yates Procedure Date : 06/18/2017 MRN: [id number] Attending MD: Ronnette Juniper , MD Date of Birth: August 01, 1945 CSN: [id number] Age: 71 Admit Type: Inpatient Procedure:                Upper GI endoscopy Indications:              Unexplained iron deficiency anemia, Melena,                            Suspected upper gastrointestinal bleeding,                            Cirrhosis rule out esophageal varices Providers:                Ronnette Juniper, MD, Elna Breslow, RN, Cletis [place],                            Technician Referring MD:              Medicines:                Monitored Anesthesia Care Complications:            No immediate complications. Estimated Blood Loss:     Estimated blood loss was minimal. Procedure:                Pre-Anesthesia Assessment:                           - Prior to the procedure, a History and Physical                            was performed, and patient medications and                            allergies were reviewed. The patient's tolerance of                            previous anesthesia was also reviewed. The risks                            and benefits of the procedure and the sedation                            options and risks were discussed with the patient.                            All questions were answered, and informed consent                            was obtained. Prior Anticoagulants: The patient has                            taken Eliquis (apixaban), last dose was 3 days                            prior  to procedure. ASA Grade Assessment: IV - A                            patient with severe systemic disease that is a                            constant threat to life. After reviewing the risks                            and benefits, the patient was deemed in                            satisfactory condition to undergo the procedure.                           After obtaining informed  consent, the endoscope was                            passed under direct vision. Throughout the                            procedure, the patient's blood pressure, pulse, and                            oxygen saturations were monitored continuously. The                            EG-[id number] ([id number]) scope was introduced through the                            mouth, and advanced to the second part of duodenum.                            The upper GI endoscopy was accomplished without                            difficulty. The patient tolerated the procedure                            well. Scope In: Scope Out: Findings:      The examined esophagus was normal.      The Z-line was regular and was found 35 cm from the incisors.      A few 6 mm sessile polyps with no bleeding and stigmata of recent       bleeding were found in the gastric body. Biopsies were taken with a cold       forceps for histology.      The cardia and gastric fundus were normal on retroflexion.      Few minimally oozing superficial gastric ulcers with oozing hemorrhage       (Forrest Class Ib) were found in the gastric antrum. The largest lesion       was 5 mm in largest dimension. Coagulation for hemostasis using argon       plasma at 1 liter/minute and 20 watts was successful.  A single 12 mm sessile polyp with no bleeding was found in the duodenal       bulb. The polyp was removed with a hot snare. Resection and retrieval       were complete.      The first portion of the duodenum and second portion of the duodenum       were normal. Impression:               - Normal esophagus.                           - Z-line regular, 35 cm from the incisors.                           - A few gastric polyps. Biopsied.                           - Oozing gastric ulcers with oozing hemorrhage                            (Forrest Class Ib). Treated with argon plasma                            coagulation (APC).                            - A single duodenal polyp. Resected and retrieved.                           - Normal first portion of the duodenum and second                            portion of the duodenum. Moderate Sedation:      Patient did not receive moderate sedation for this procedure, but       instead received monitored anesthesia care. Recommendation:           - Resume regular diet.                           - Resume Eliquis (apixaban) at prior dose tomorrow.                            Refer to referring physician for further adjustment                            of therapy.                           - Await pathology results.                           - Use Protonix (pantoprazole) 40 mg PO BID for 2                            weeks,then protonix/PPI indefinitely as long as  Eliquis/anticoagulation is continued.                           - Admit the patient to hospital ward for ongoing                            care.                           - Return patient to hospital ward for ongoing care. Procedure Code(s):        --- Professional ---                           [phone], 57, Esophagogastroduodenoscopy, flexible,                            transoral; with control of bleeding, any method                           43251, Esophagogastroduodenoscopy, flexible,                            transoral; with removal of tumor(s), polyp(s), or                            other lesion(s) by snare technique                           43239, 22, Esophagogastroduodenoscopy, flexible,                            transoral; with biopsy, single or multiple Diagnosis Code(s):        --- Professional ---                           K31.7, Polyp of stomach and duodenum                           K25.4, Chronic or unspecified gastric ulcer with                            hemorrhage                           D50.9, Iron deficiency anemia, unspecified                           K92.1, Melena  (includes Hematochezia)                           K74.60, Unspecified cirrhosis of liver CPT copyright 2016 [employer]. All rights reserved. The codes documented in this report are preliminary and upon coder review may  be revised to meet current compliance requirements. Ronnette Juniper, MD 06/18/2017 8:00:10 AM This report has been signed electronically. Number of Addenda: 0

## 2017-06-18 NOTE — Anesthesia Procedure Notes (Signed)
Procedure Name: MAC Date/Time: 06/18/2017 7:37 AM Performed by: Oletta Lamas, CRNA Pre-anesthesia Checklist: Patient identified, Emergency Drugs available, Suction available and Patient being monitored Patient Re-evaluated:Patient Re-evaluated prior to induction Oxygen Delivery Method: Simple face mask

## 2017-06-18 NOTE — Progress Notes (Signed)
Spoke with pt after review of anticipated discharge plans with Posey Pronto,  MD. Pt verbalized understanding of MOON and need for ABN later today if not discharged.

## 2017-06-18 NOTE — Progress Notes (Signed)
Pharmacist Heart Failure Core Measure Documentation  Assessment: Stephanie Yates has an EF documented as 20-25% on 02/2017 by ECHO.  Rationale: Heart failure patients with left ventricular systolic dysfunction (LVSD) and an EF < 40% should be prescribed an angiotensin converting enzyme inhibitor (ACEI) or angiotensin receptor blocker (ARB) at discharge unless a contraindication is documented in the medical record.  This patient is not currently on an ACEI or ARB for HF.  This note is being placed in the record in order to provide documentation that a contraindication to the use of these agents is present for this encounter.  ACE Inhibitor or Angiotensin Receptor Blocker is contraindicated (specify all that apply)  []   ACEI allergy AND ARB allergy []   Angioedema []   Moderate or severe aortic stenosis []   Hyperkalemia []   Hypotension []   Renal artery stenosis [x]   Worsening renal function, preexisting renal disease or dysfunction   Brain Hilts 06/18/2017 1:55 PM

## 2017-06-18 NOTE — Anesthesia Postprocedure Evaluation (Signed)
Anesthesia Post Note  Patient: ELLANIE OPPEDISANO  Procedure(s) Performed: ESOPHAGOGASTRODUODENOSCOPY (EGD) WITH PROPOFOL (N/A )     Patient location during evaluation: PACU Anesthesia Type: MAC Level of consciousness: awake and alert Pain management: pain level controlled Vital Signs Assessment: post-procedure vital signs reviewed and stable Respiratory status: spontaneous breathing, nonlabored ventilation and respiratory function stable Cardiovascular status: stable and blood pressure returned to baseline Postop Assessment: no apparent nausea or vomiting Anesthetic complications: no    Last Vitals:  Vitals:   06/18/17 0801 06/18/17 0805  BP: (!) 128/57 (!) 139/52  Pulse: (!) 56 61  Resp: 18 19  Temp: 36.6 C   SpO2: 97% 99%    Last Pain:  Vitals:   06/18/17 0801  TempSrc: Oral  PainSc:                  Skylan Gift,W. EDMOND

## 2017-06-18 NOTE — Brief Op Note (Signed)
06/15/2017 - 06/18/2017  8:00 AM  PATIENT:  Stephanie Yates  71 y.o. female  PRE-OPERATIVE DIAGNOSIS:  GI bleed  POST-OPERATIVE DIAGNOSIS:  pyloric ulcers, gastritis, gstric polyp/mass, bx taken  PROCEDURE:  Procedure(s): ESOPHAGOGASTRODUODENOSCOPY (EGD) WITH PROPOFOL (N/A)  SURGEON:  Surgeon(s) and Role:    Ronnette Juniper, MD - Primary  PHYSICIAN ASSISTANT:   ASSISTANTS:Elizabeht Honeycutt, RN, Cletis Athens, Tech  ANESTHESIA:   MAC  EBL:  Minimal  BLOOD ADMINISTERED:none  DRAINS: none   LOCAL MEDICATIONS USED:  NONE  SPECIMEN:  Biopsy / Limited Resection  DISPOSITION OF SPECIMEN:  PATHOLOGY  COUNTS:  YES  TOURNIQUET:  * No tourniquets in log *  DICTATION: .Dragon Dictation  PLAN OF CARE: Admit to inpatient   PATIENT DISPOSITION:  PACU, stable   Delay start of Pharmacological VTE agent (>24hrs) due to surgical blood loss or risk of bleeding: yes

## 2017-06-18 NOTE — Anesthesia Preprocedure Evaluation (Addendum)
Anesthesia Evaluation  Patient identified by MRN, date of birth, ID band Patient awake    Reviewed: Allergy & Precautions, H&P , NPO status , Patient's Chart, lab work & pertinent test results, reviewed documented beta blocker date and time   Airway Mallampati: II  TM Distance: >3 FB Neck ROM: Full    Dental no notable dental hx. (+) Edentulous Upper, Edentulous Lower, Dental Advisory Given   Pulmonary neg pulmonary ROS, former smoker,    Pulmonary exam normal breath sounds clear to auscultation       Cardiovascular hypertension, Pt. on medications and Pt. on home beta blockers +CHF   Rhythm:Regular Rate:Normal     Neuro/Psych negative neurological ROS  negative psych ROS   GI/Hepatic negative GI ROS, Neg liver ROS,   Endo/Other  negative endocrine ROS  Renal/GU Renal InsufficiencyRenal disease  negative genitourinary   Musculoskeletal   Abdominal   Peds  Hematology negative hematology ROS (+) anemia ,   Anesthesia Other Findings   Reproductive/Obstetrics negative OB ROS                            Anesthesia Physical Anesthesia Plan  ASA: IV  Anesthesia Plan: MAC   Post-op Pain Management:    Induction: Intravenous  PONV Risk Score and Plan: 2 and Propofol infusion and Treatment may vary due to age or medical condition  Airway Management Planned: Nasal Cannula  Additional Equipment:   Intra-op Plan:   Post-operative Plan:   Informed Consent: I have reviewed the patients History and Physical, chart, labs and discussed the procedure including the risks, benefits and alternatives for the proposed anesthesia with the patient or authorized representative who has indicated his/her understanding and acceptance.   Dental advisory given  Plan Discussed with: CRNA  Anesthesia Plan Comments:         Anesthesia Quick Evaluation

## 2017-06-18 NOTE — Discharge Instructions (Signed)

## 2017-06-19 LAB — BPAM RBC
BLOOD PRODUCT EXPIRATION DATE: 201901142359
BLOOD PRODUCT EXPIRATION DATE: 201901142359
BLOOD PRODUCT EXPIRATION DATE: 201901172359
Blood Product Expiration Date: 201901142359
Blood Product Expiration Date: 201901172359
ISSUE DATE / TIME: 201812202159
ISSUE DATE / TIME: 201812211058
ISSUE DATE / TIME: 201812210111
UNIT TYPE AND RH: 5100
UNIT TYPE AND RH: 5100
Unit Type and Rh: 5100
Unit Type and Rh: 5100
Unit Type and Rh: 5100

## 2017-06-19 LAB — TYPE AND SCREEN
ABO/RH(D): O POS
ANTIBODY SCREEN: POSITIVE
DAT, IGG: NEGATIVE
DONOR AG TYPE: NEGATIVE
DONOR AG TYPE: NEGATIVE
DONOR AG TYPE: NEGATIVE
Donor AG Type: NEGATIVE
Donor AG Type: NEGATIVE
PT AG Type: NEGATIVE
UNIT DIVISION: 0
UNIT DIVISION: 0
Unit division: 0
Unit division: 0
Unit division: 0

## 2017-06-19 LAB — MITOCHONDRIAL ANTIBODIES: Mitochondrial M2 Ab, IgG: 20.2 Units — ABNORMAL HIGH (ref 0.0–20.0)

## 2017-06-19 LAB — ANTI-SMOOTH MUSCLE ANTIBODY, IGG: F-ACTIN AB IGG: 29 U — AB (ref 0–19)

## 2017-06-19 NOTE — Discharge Summary (Signed)
[facility] Discharge Summary   Patient: Stephanie Yates FYB:[id number]   PCP: Josetta Huddle, MD DOB: [birth year]   Date of admission: 06/15/2017   Date of discharge: 06/18/2017     Discharge Diagnoses:  Principal Problem:   Acute GI bleeding Active Problems:   Nonischemic cardiomyopathy (HCC)   PAF (paroxysmal atrial fibrillation) (HCC)   CKD (chronic kidney disease), stage III (HCC)   Symptomatic anemia   Chronic anticoagulation   Admitted From: home Disposition:  Home with family  Recommendations for Outpatient Follow-up:  1. Please follow-up with PCP in 1 week with a repeat CBC. 2. Repeat follow-up with GI in 1 month. 3. Also recommended to establish care with [facility] in 1 month for further workup of the gallbladder mass. 4. Will need to follow-up with cardiology for preop evaluation  Follow-up Information    Josetta Huddle, MD. Schedule an appointment as soon as possible for a visit in 1 week(s).   Specialty:  Internal Medicine Contact information: [address] [place] [address] [phone]        Stark Klein, MD. Schedule an appointment as soon as possible for a visit in 1 month(s).   Specialty:  General Surgery Contact information: [address] [place] [address] [phone]        Ronnette Juniper, MD. Schedule an appointment as soon as possible for a visit in 1 month(s).   Specialty:  Gastroenterology Contact information: [place] [address] [phone]          Diet recommendation: cardiac diet  Activity: The patient is advised to gradually reintroduce usual activities.  Discharge Condition: good  Code Status: full code  History of present illness: As per the H and P dictated on admission, "Stephanie Yates is a 71 y.o. female with medical history significant of CKD, HTN, NICM comes in after getting abnormal labs done at PCP yesterday with hgb of 6.  Pt  reports no weakness or fatigue.  She cannot say if she has had bloody stools as she never looks at her stool.  She denies any abdominal pain.  No nausea or vomiting.  No weight loss.  Reports normal colonoscopy in 2011.  Pt referred for admission for likely gi bleed, heme positive in ED and maroonish stool on rectal per PA in ED.  GI called for consult."  Hospital Course:  Summary of her active problems in the hospital is as following. 1-symptomatic anemia: patient reported some fatigue and found with Hgb of 6. -patient with positive FOBT and maroon stools on admission. -3 units of PRBC's given -Hemoglobin 9.0, remained stable on repeat - EGD shows pyloric ulcers, gastritis, gastric polyp/mass, bx taken,  - gastroenterology recommends to resume apixaban  -Continue PPI as long as she is on apixaban   2-HTN: -Well-controlled. -Continue Coreg and lasix  3-non-ischemic cardiomyopathy  -Last ejection fraction 20-25% -Stable and compensated -Continue to follow daily weights, strict intake and output, low-sodium diet. -Continue beta-blockers; no use of ACE/ARB in the setting of chronic kidney disease.  4-PAF -rate has remained controlled -continue amiodarone and coreg -continue holding Eliquis  5-HLD -Continue statins   6-CKD stage 3 -Stable and currently at baseline -Continue monitoring renal function trend intermittently.  7-hepatic cirrhosis/portal hypertension -Check hepatitis B antigen, hepatitis C antibody, ceruloplasmin, alpha 1 antitrypsin, AMA and ASMA. Pending now. -pt will folow up with gastroenterology as outpt.  -verified with daughter to ensure she remains complaint  with her follow up.  Apparently pt does not inform her family about her upcoming appointments   8. Gallbladder mass Patient has a heterogenous 3 cm x 3 cm size gallbladder mass concerning for a malignant mass.  Discussed with general surgery recommend outpatient follow-up. Unfortunately patient is  not able to get a CT scan with IV contrast due to her chronic kidney disease as well as MRI due to her pacemaker and ICD. Patient was given general surgery office number, again verified with daughter to ensure that she maintains her follow-up appointment.  All other chronic medical condition were stable during the hospitalization.  Patient was ambulatory without any assistance. On the day of the discharge the patient's vitals were stable, and no other acute medical condition were reported by patient. the patient was felt safe to be discharge at home with famioly.  Procedures and Results:  EGD  US abdomen   Consultations:  Gastroenterology   DISCHARGE MEDICATION: Allergies as of 06/18/2017      Reactions   Strawberry Extract Hives, Itching, Swelling, Other (See Comments)   Hives and itching      Medication List    TAKE these medications   amiodarone 200 MG tablet Commonly known as:  PACERONE Take 1 tablet (200 mg total) by mouth daily.   apixaban 2.5 MG Tabs tablet Commonly known as:  ELIQUIS Take 1 tablet (2.5 mg total) by mouth 2 (two) times daily.   atorvastatin 40 MG tablet Commonly known as:  LIPITOR Take 1 tablet (40 mg total) by mouth daily at 6 PM.   carvedilol 6.25 MG tablet Commonly known as:  COREG Take 1 tablet (6.25 mg total) by mouth 2 (two) times daily with a meal.   furosemide 40 MG tablet Commonly known as:  LASIX Take 1 tablet (40 mg total) by mouth daily.   HYDROcodone-acetaminophen 5-325 MG tablet Commonly known as:  NORCO/VICODIN Take 1 tablet by mouth 2 (two) times daily as needed. for pain   pantoprazole 40 MG tablet Commonly known as:  PROTONIX Take 1 tablet (40 mg total) by mouth 2 (two) times daily before a meal.   VITAMIN D-3 PO Take 1,000 Units by mouth 2 (two) times daily.      Allergies  Allergen Reactions  . Strawberry Extract Hives, Itching, Swelling and Other (See Comments)    Hives and itching    Discharge Instructions     Ambulatory referral to General Surgery   Complete by:  As directed    Diet - low sodium heart healthy   Complete by:  As directed    Discharge instructions   Complete by:  As directed    It is important that you read following instructions as well as go over your medication list with RN to help you understand your care after this hospitalization.  Discharge Instructions:  Please follow-up with PCP in one week  Please request your primary care physician to go over all Hospital Tests and Procedure/Radiological results at the follow up,  Please get all Hospital records sent to your PCP by signing hospital release before you go home.   Do not take more than prescribed Pain, Sleep and Anxiety Medications. You were cared for by a hospitalist during your hospital stay. If you have any questions about your discharge medications or the care you received while you were in the hospital after you are discharged, you can call the unit and ask to speak with the hospitalist on call if the hospitalist that took  care of you is not available.  Once you are discharged, your primary care physician will handle any further medical issues. Please note that NO REFILLS for any discharge medications will be authorized once you are discharged, as it is imperative that you return to your primary care physician (or establish a relationship with a primary care physician if you do not have one) for your aftercare needs so that they can reassess your need for medications and monitor your lab values. You Must read complete instructions/literature along with all the possible adverse reactions/side effects for all the Medicines you take and that have been prescribed to you. Take any new Medicines after you have completely understood and accept all the possible adverse reactions/side effects. Wear Seat belts while driving. If you have smoked or chewed Tobacco in the last 2 yrs please stop smoking and/or stop any Recreational  drug use.   Increase activity slowly   Complete by:  As directed      Discharge Exam: Filed Weights   06/15/17 2043  Weight: 65.7 kg (144 lb 14.4 oz)   Vitals:   06/18/17 0805 06/18/17 1434  BP: (!) 139/52 137/69  Pulse: 61 (!) 50  Resp: 19 19  Temp:  98.2 F (36.8 C)  SpO2: 99% 97%   General: Appear in no distress, no Rash; Oral Mucosa moist Cardiovascular: S1 and S2 Present, no Murmur, no JVD Respiratory: Bilateral Air entry present and Clear to Auscultation, no Crackles, no wheezes Abdomen: Bowel Sound present, Soft and no tenderness Extremities: no Pedal edema, no calf tenderness Neurology: Grossly no focal neuro deficit.  The results of significant diagnostics from this hospitalization (including imaging, microbiology, ancillary and laboratory) are listed below for reference.    Significant Diagnostic Studies: Ct Abdomen Pelvis Wo Contrast  Result Date: 06/17/2017 CLINICAL DATA:  71 year old female with abdominal discomfort and history of recent abnormal ultrasound (masslike region near the fundus of the gallbladder). EXAM: CT ABDOMEN AND PELVIS WITHOUT CONTRAST TECHNIQUE: Multidetector CT imaging of the abdomen and pelvis was performed following the standard protocol without IV contrast. COMPARISON:  Abdominal ultrasound 04/05/2017 ; prior CT scan of the abdomen and pelvis 08/29/2014 FINDINGS: Lower chest: Cardiomegaly with left heart enlargement. A cardiac rhythm maintenance device is partially imaged. Leads project over the right ventricle. No pericardial effusion. Small hiatal hernia. Nonspecific patchy opacities in both lung bases raise the possibility of an active infectious/ inflammatory process. There is also mild dependent atelectasis. Hepatobiliary: Subtle nodularity of the hepatic contour and the liver is relatively hyperdense compared to the spleen. The portal vein appears slightly engorged. There is trace perihepatic ascites. The gallbladder is somewhat poorly imaged  in the absence of intravenous contrast. No obvious radiopaque stones or biliary ductal dilatation. Pancreas: Unremarkable. No pancreatic ductal dilatation or surrounding inflammatory changes. Spleen: Normal in size without focal abnormality. Adrenals/Urinary Tract: Slightly enlarged low-attenuation lesion exophytic from the right adrenal gland now measuring 3.2 by 1.8 cm. This likely represents a benign hemangioma, or myelolipoma. The left adrenal gland remains unchanged. No evidence of hydronephrosis or renal edema. Nonobstructing 5 mm stone in the lower pole of the right kidney. Punctate stone in the lower pole of the left kidney. The bladder is unremarkable. Stomach/Bowel: Colonic diverticular disease without CT evidence of active inflammation. Right femoral hernia containing a loop of small bowel and a small amount of ascitic fluid. Smaller left-sided femoral hernia containing a single knuckle of small bowel. No evidence of incarceration or bowel inflammation. Vascular/Lymphatic: Atherosclerotic calcifications throughout  the abdominal aorta. No aneurysm. No suspicious adenopathy. Reproductive: No adnexal mass. Calcified degenerated uterine fibroids. Other: Bilateral femoral hernias larger on the right than the left as described above. Mild perihepatic ascites. Musculoskeletal: No acute fracture or aggressive appearing lytic or blastic osseous lesion. Stable grade 1 anterolisthesis of L5 on S1. IMPRESSION: 1. Subtle morphologic changes of hepatic cirrhosis. Additionally, the hepatic parenchyma is relatively hyperdense to the spleen. Given the patient's history of chronic anemia, this could represent secondary hemochromatosis if she has undergone multiple prior transfusions. 2. Enlarged portal veins and trace perihepatic ascites suggests underlying portal hypertension. The patency of the portal veins cannot be assessed in the absence of intravenous contrast. If there is clinical concern for portal venous  thrombosis, liver Doppler ultrasound could be considered. 3. Cardiomegaly with left heart enlargement. 4. Nonspecific patchy opacities in both lung bases raise the possibility of an active infectious/inflammatory process. 5. The questioned gallbladder lesion on the prior ultrasound is not visualized in the absence of intravenous contrast. 6. Enlarging low-attenuation right adrenal lesion compared to 08/29/2014. This likely represents a benign adenoma or myelolipoma. A collision tumor is a less likely possibility of the patient has a known diagnosis of malignancy. 7. Nonobstructing bilateral lower pole nephrolithiasis. 8. Bilateral right larger than left femoral hernias containing small bowel without evidence of obstruction or incarceration. 9. Colonic diverticular disease without CT evidence of active inflammation. 10.  Aortic Atherosclerosis (ICD10-170.0) 11. Stable grade 1 anterolisthesis of L5 on S1. Electronically Signed   By: Jacqulynn Cadet M.D.   On: 06/17/2017 14:23   US Abdomen Limited  Result Date: 06/18/2017 CLINICAL DATA:  Gallbladder mass. EXAM: ULTRASOUND ABDOMEN LIMITED RIGHT UPPER QUADRANT COMPARISON:  Ultrasound, 04/05/2017.  CT, 06/16/2017. FINDINGS: Gallbladder: Heterogeneous soft tissue is noted in the gallbladder fundus with small cystic spaces. The mass is not appear significantly changed from the prior study. It currently measures approximately 3.6 x 2.1 x 3.0 cm. There is diffuse gallbladder wall thickening measuring up to 6 mm. No gallstones. No sonographic Murphy's sign. Common bile duct: Diameter: 4 mm Liver: Coarsened heterogeneous echotexture. Prominent left lobe. Findings consistent with cirrhosis as noted on the recent CT. No discrete liver mass or focal lesion. Portal vein is patent on color Doppler imaging with normal direction of blood flow towards the liver. IMPRESSION: 1. The heterogeneous mass in the gallbladder fundus is without change from the prior study. The stability  of this area supports a benign etiology such as focal adenomyomatosis. Gallbladder malignancy is not excluded. 2. Diffuse gallbladder wall thickening is nonspecific. It is likely reactive to the adjacent liver pathology. 3. Liver appearance consistent with cirrhosis.  No liver mass. 4. No acute findings. Electronically Signed   By: Lajean Manes M.D.   On: 06/18/2017 15:51    Microbiology: No results found for this or any previous visit (from the past 240 hour(s)).   Labs: CBC: Recent Labs  Lab 06/15/17 1735 06/16/17 0450 06/17/17 0625  WBC 7.4 5.7 5.7  HGB 6.3* 7.3* 9.0*  HCT 20.1* 22.6* 26.9*  MCV 65.7* 69.5* 70.2*  PLT 292 217 518   Basic Metabolic Panel: Recent Labs  Lab 06/15/17 1735 06/16/17 0450  NA 138 139  K 3.8 3.7  CL 108 109  CO2 23 22  GLUCOSE 99 75  BUN 34* 32*  CREATININE 2.11* 1.87*  CALCIUM 8.6* 8.5*   Liver Function Tests: Recent Labs  Lab 06/15/17 1735  AST 62*  ALT 21  ALKPHOS 112  BILITOT 1.3*  PROT 7.4  ALBUMIN 2.7*   No results for input(s): LIPASE, AMYLASE in the last 168 hours. No results for input(s): AMMONIA in the last 168 hours. Cardiac Enzymes: No results for input(s): CKTOTAL, CKMB, CKMBINDEX, TROPONINI in the last 168 hours. BNP (last 3 results) Recent Labs    03/04/17 2040 06/15/17 1902  BNP 885.7* 594.9*   CBG: No results for input(s): GLUCAP in the last 168 hours. Time spent: 35 minutes  Signed:  Berle Mull  [facility] 06/18/2017 , 5:33 PM

## 2017-06-26 ENCOUNTER — Ambulatory Visit (HOSPITAL_COMMUNITY): Admission: RE | Admit: 2017-06-26 | Payer: Medicare Other | Source: Ambulatory Visit

## 2017-06-29 ENCOUNTER — Telehealth: Payer: Self-pay | Admitting: Internal Medicine

## 2017-06-29 DIAGNOSIS — I5022 Chronic systolic (congestive) heart failure: Secondary | ICD-10-CM

## 2017-06-29 DIAGNOSIS — I480 Paroxysmal atrial fibrillation: Secondary | ICD-10-CM

## 2017-06-29 MED ORDER — AMIODARONE HCL 200 MG PO TABS: 200.0000 mg | ORAL_TABLET | Freq: Every day | ORAL | 2 refills | Status: DC

## 2017-06-29 NOTE — Telephone Encounter (Signed)
New Message   *STAT* If patient is at the pharmacy, call can be transferred to refill team.   1. Which medications need to be refilled? (please list name of each medication and dose if known) Amiodarone 200mg   2. Which pharmacy/location (including street and city if local pharmacy) is medication to be sent to? [address]  3. Do they need a 30 day or 90 day supply? [place]

## 2017-06-29 NOTE — Telephone Encounter (Signed)
Pt's medication was sent to pt's pharmacy as requested. Confirmation received.  °

## 2017-07-12 ENCOUNTER — Ambulatory Visit (HOSPITAL_COMMUNITY)
Admission: RE | Admit: 2017-07-12 | Discharge: 2017-07-12 | Disposition: A | Discharge status: Home / Self Care | Payer: Medicare Other | Source: Ambulatory Visit | Attending: Internal Medicine | Admitting: Internal Medicine

## 2017-07-12 DIAGNOSIS — R1907 Generalized intra-abdominal and pelvic swelling, mass and lump: Secondary | ICD-10-CM

## 2017-07-12 DIAGNOSIS — K7469 Other cirrhosis of liver: Secondary | ICD-10-CM | POA: Insufficient documentation

## 2017-07-12 DIAGNOSIS — R1901 Right upper quadrant abdominal swelling, mass and lump: Secondary | ICD-10-CM | POA: Insufficient documentation

## 2017-07-12 DIAGNOSIS — E279 Disorder of adrenal gland, unspecified: Secondary | ICD-10-CM | POA: Insufficient documentation

## 2017-07-12 DIAGNOSIS — N200 Calculus of kidney: Secondary | ICD-10-CM | POA: Diagnosis not present

## 2017-07-12 DIAGNOSIS — I700 Atherosclerosis of aorta: Secondary | ICD-10-CM | POA: Insufficient documentation

## 2017-07-12 MED ORDER — IOPAMIDOL (ISOVUE-300) INJECTION 61%
INTRAVENOUS | Status: AC
  Administered 2017-07-12: 100 mL
  Filled 2017-07-12: qty 100

## 2017-07-18 ENCOUNTER — Encounter (HOSPITAL_COMMUNITY): Payer: Self-pay

## 2017-07-18 ENCOUNTER — Emergency Department (HOSPITAL_COMMUNITY): Payer: Medicare Other

## 2017-07-18 ENCOUNTER — Emergency Department (HOSPITAL_COMMUNITY)
Admission: EM | Admit: 2017-07-18 | Discharge: 2017-07-18 | Disposition: A | Discharge status: Home / Self Care | Payer: Medicare Other | Attending: Emergency Medicine | Admitting: Emergency Medicine

## 2017-07-18 ENCOUNTER — Other Ambulatory Visit: Payer: Self-pay

## 2017-07-18 DIAGNOSIS — Z7901 Long term (current) use of anticoagulants: Secondary | ICD-10-CM | POA: Insufficient documentation

## 2017-07-18 DIAGNOSIS — I5022 Chronic systolic (congestive) heart failure: Secondary | ICD-10-CM | POA: Diagnosis not present

## 2017-07-18 DIAGNOSIS — M25561 Pain in right knee: Secondary | ICD-10-CM | POA: Diagnosis present

## 2017-07-18 DIAGNOSIS — M25061 Hemarthrosis, right knee: Secondary | ICD-10-CM | POA: Insufficient documentation

## 2017-07-18 DIAGNOSIS — N183 Chronic kidney disease, stage 3 (moderate): Secondary | ICD-10-CM | POA: Insufficient documentation

## 2017-07-18 DIAGNOSIS — I130 Hypertensive heart and chronic kidney disease with heart failure and stage 1 through stage 4 chronic kidney disease, or unspecified chronic kidney disease: Secondary | ICD-10-CM | POA: Diagnosis not present

## 2017-07-18 DIAGNOSIS — M2500 Hemarthrosis, unspecified joint: Secondary | ICD-10-CM

## 2017-07-18 DIAGNOSIS — Z87891 Personal history of nicotine dependence: Secondary | ICD-10-CM | POA: Diagnosis not present

## 2017-07-18 DIAGNOSIS — Z79899 Other long term (current) drug therapy: Secondary | ICD-10-CM | POA: Insufficient documentation

## 2017-07-18 MED ORDER — HYDROCODONE-ACETAMINOPHEN 5-325 MG PO TABS
1.0000 | ORAL_TABLET | Freq: Once | ORAL | Status: AC
  Administered 2017-07-18: 1 via ORAL
  Filled 2017-07-18: qty 1

## 2017-07-18 MED ORDER — ACETAMINOPHEN 325 MG PO TABS: 650.0000 mg | ORAL_TABLET | Freq: Four times a day (QID) | ORAL | 0 refills | Status: DC | PRN

## 2017-07-18 MED ORDER — LIDOCAINE HCL (PF) 1 % IJ SOLN
10.0000 mL | Freq: Once | INTRAMUSCULAR | Status: AC
  Administered 2017-07-18: 10 mL
  Filled 2017-07-18: qty 10

## 2017-07-18 MED ORDER — DICLOFENAC SODIUM 3 % TD GEL: 1.0000 "application " | Freq: Two times a day (BID) | TRANSDERMAL | 0 refills | Status: DC

## 2017-07-18 NOTE — ED Provider Notes (Signed)
[facility] EMERGENCY DEPARTMENT Provider Note   CSN: [id number] Arrival date & time: 07/18/17  1610     History   Chief Complaint Chief Complaint  Patient presents with  . Knee Pain    HPI Stephanie Yates is a 72 y.o. female.  Stephanie Yates is a 72 y.o. Female who presents to the emergency department complaining of worsening right knee pain today. She reports she was walking when she felt a pop and worse pain.  She denies any recent known trauma or falls.  She reports she has intermittent problems with right knee pain and swelling.  She reports the swelling to her right knee is worse today.  She reports worsening pain with any movement.  She denies history of a septic joint.  She had no fevers or redness to her knee.  She denies any falls or trauma to her knee.  No treatments attempted prior to arrival.  She denies fevers, redness, trauma to the area, calf pain or swelling, or rashes.    The history is provided by the patient and medical records. No language interpreter was used.  Knee Pain   Pertinent negatives include no numbness.    Past Medical History:  Diagnosis Date  . Chronic renal insufficiency, stage III (moderate) (HCC)    Cr 2.0 5/15  . Chronic systolic CHF (congestive heart failure) ([place])   . History of tobacco abuse   . Hyperlipidemia   . Hypertension   . Nonischemic cardiomyopathy ([place])    EF 10-15% on 11/2012 cath  . Sinus bradycardia 12/21/2012  . Ventricular tachycardia ([place]) 12/14/2012    Patient Active Problem List   Diagnosis Date Noted  . Acute GI bleeding 06/15/2017  . Chronic anticoagulation   . Acute on chronic congestive heart failure ([place])   . HLD (hyperlipidemia) 03/05/2017  . CKD (chronic kidney disease), stage III ([place]) 03/05/2017  . Chest pain 03/05/2017  . Symptomatic anemia 03/05/2017  . PAF (paroxysmal atrial fibrillation) ([place]) 04/01/2013  . ICD (implantable cardioverter-defibrillator), dual, st Judes 04/01/2013    . PVC's (premature ventricular contractions) 04/01/2013  . Hypokalemia 12/21/2012  . History of tobacco abuse 12/21/2012  . Elevated troponin 12/21/2012  . Sinus bradycardia 12/21/2012  . Hyperkalemia 12/20/2012  . Nonischemic cardiomyopathy ([place]) 12/20/2012  . UTI (urinary tract infection) 12/20/2012  . Acute on chronic combined systolic and diastolic CHF (congestive heart failure) ([place]) 12/20/2012  . Ventricular tachycardia ([place]) 12/14/2012  . Hypertension 12/14/2012    Past Surgical History:  Procedure Laterality Date  . ESOPHAGOGASTRODUODENOSCOPY (EGD) WITH PROPOFOL N/A 06/18/2017   Procedure: ESOPHAGOGASTRODUODENOSCOPY (EGD) WITH PROPOFOL;  Surgeon: Ronnette Juniper, MD;  Location: [facility];  Service: Gastroenterology;  Laterality: N/A;  . IMPLANTABLE CARDIOVERTER DEFIBRILLATOR IMPLANT  12/19/2012   St. Jude dual-chamber ICD, serial number [id number]  . IMPLANTABLE CARDIOVERTER DEFIBRILLATOR IMPLANT N/A 12/19/2012   Procedure: IMPLANTABLE CARDIOVERTER DEFIBRILLATOR IMPLANT;  Surgeon: Deboraha Sprang, MD;  Location: [facility] CATH LAB;  Service: Cardiovascular;  Laterality: N/A;  . LEFT AND RIGHT HEART CATHETERIZATION WITH CORONARY ANGIOGRAM N/A 12/17/2012   Procedure: LEFT AND RIGHT HEART CATHETERIZATION WITH CORONARY ANGIOGRAM;  Surgeon: Sherren Mocha, MD;  Location: [facility] C[facility] LAB;  Service: Cardiovascular;  Laterality: N/A;    OB History    No data available       Home Medications    Prior to Admission medications   Medication Sig Start Date End Date Taking? Authorizing Provider  acetaminophen (TYLENOL) 325 MG tablet Take 2 tablets (650  mg total) by mouth every 6 (six) hours as needed for mild pain or moderate pain. 07/18/17   Waynetta Pean, PA-C  amiodarone (PACERONE) 200 MG tablet Take 1 tablet (200 mg total) by mouth daily. 06/29/17   Deboraha Sprang, MD  apixaban (ELIQUIS) 2.5 MG TABS tablet Take 1 tablet (2.5 mg total) by mouth 2 (two) times daily. 03/13/17   Hongalgi, Lenis Dickinson, MD   atorvastatin (LIPITOR) 40 MG tablet Take 1 tablet (40 mg total) by mouth daily at 6 PM. 03/13/17   Hongalgi, Lenis Dickinson, MD  carvedilol (COREG) 6.25 MG tablet Take 1 tablet (6.25 mg total) by mouth 2 (two) times daily with a meal. 03/13/17   Hongalgi, Lenis Dickinson, MD  Cholecalciferol (VITAMIN D-3 PO) Take 1,000 Units by mouth 2 (two) times daily.     [provider]  furosemide (LASIX) 40 MG tablet Take 1 tablet (40 mg total) by mouth daily. 03/13/17   Hongalgi, Lenis Dickinson, MD  HYDROcodone-acetaminophen (NORCO/VICODIN) 5-325 MG tablet Take 1 tablet by mouth 2 (two) times daily as needed. for pain 04/25/17   [provider]  pantoprazole (PROTONIX) 40 MG tablet Take 1 tablet (40 mg total) by mouth 2 (two) times daily before a meal. 06/18/17   Lavina Hamman, MD    Family History Family History  Problem Relation Age of Onset  . Cancer Mother     Social History Social History   Tobacco Use  . Smoking status: Former Research scientist (life sciences)  . Smokeless tobacco: Never Used  Substance Use Topics  . Alcohol use: No  . Drug use: No     Allergies   Strawberry extract   Review of Systems Review of Systems  Constitutional: Negative for fever.  Musculoskeletal: Positive for arthralgias, gait problem and joint swelling.  Skin: Negative for color change, rash and wound.  Neurological: Negative for weakness and numbness.     Physical Exam Updated Vital Signs BP (!) 174/84   Pulse 69   Temp 97.9 F (36.6 C) (Oral)   Resp 15   Ht 5\' 6"  (1.676 m)   Wt 65.8 kg (145 lb)   SpO2 100%   BMI 23.40 kg/m   Physical Exam  Constitutional: She appears well-developed and well-nourished. No distress.  Non- toxic appearing.  HENT:  Head: Normocephalic and atraumatic.  Eyes: Right eye exhibits no discharge. Left eye exhibits no discharge.  Cardiovascular: Normal rate, regular rhythm and intact distal pulses.  Bilateral dorsalis pedis and posterior tibialis pulses are intact.  Pulmonary/Chest:  Effort normal. No respiratory distress.  Musculoskeletal: She exhibits edema and tenderness. She exhibits no deformity.  Large right knee joint effusion noted.  There is also joint effusion noted to her left knee.  Pain with range of motion of her right knee.  Reduced range of motion due to pain.  No calf edema or tenderness bilaterally. No erythema or warmth.   Neurological: She is alert. Coordination normal.  Skin: Skin is warm and dry. No rash noted. She is not diaphoretic. No erythema. No pallor.  Psychiatric: She has a normal mood and affect. Her behavior is normal.  Nursing note and vitals reviewed.    ED Treatments / Results  Labs (all labs ordered are listed, but only abnormal results are displayed) Labs Reviewed - No data to display  EKG  EKG Interpretation None       Radiology Dg Knee Complete 4 Views Right  Result Date: 07/18/2017 CLINICAL DATA:  Pain and swelling of the right  knee, no acute trauma EXAM: RIGHT KNEE - COMPLETE 4+ VIEW COMPARISON:  Right knee films of 03/08/2017 FINDINGS: No acute fracture is seen. However, there is mild tricompartmental degenerative joint disease with some spurring from each compartment. There is irregularity of the posterior aspect of the patella which may be due to chondromalacia. There is a moderate-sized right knee joint effusion present. IMPRESSION: 1. Moderate size right knee joint effusion. 2. No fracture. 3. Mild tricompartmental degenerative joint disease for age with particular involvement of the posterior patella. Electronically Signed   By: Ivar Drape M.D.   On: 07/18/2017 17:27    Procedures .Joint Aspiration/Arthrocentesis Date/Time: 07/18/2017 7:01 PM Performed by: Waynetta Pean, PA-C Authorized by: Waynetta Pean, PA-C   Consent:    Consent obtained:  Verbal and written   Consent given by:  Patient   Risks discussed:  Bleeding, infection, incomplete drainage and pain   Alternatives discussed:  No treatment and  delayed treatment Universal protocol:    Procedure explained and questions answered to patient or proxy's satisfaction: yes     Relevant documents present and verified: yes     Test results available and properly labeled: yes     Imaging studies available: yes     Required blood products, implants, devices, and special equipment available: yes     Site/side marked: yes     Immediately prior to procedure, a time out was called: yes     Patient identity confirmed:  Verbally with patient and arm band Location:    Location:  Knee   Knee:  R knee Anesthesia (see MAR for exact dosages):    Anesthesia method:  Local infiltration   Local anesthetic:  Lidocaine 1% w/o epi Procedure details:    Preparation: Patient was prepped and draped in usual sterile fashion     Needle gauge:  18 G   Ultrasound guidance: no     Approach:  Lateral   Aspirate amount:  60 ml    Aspirate characteristics:  Bloody   Steroid injected: no     Specimen collected: no   Post-procedure details:    Dressing:  Adhesive bandage   Patient tolerance of procedure:  Tolerated well, no immediate complications     Medications Ordered in ED Medications  HYDROcodone-acetaminophen (NORCO/VICODIN) 5-325 MG per tablet 1 tablet (1 tablet Oral Given 07/18/17 1847)  lidocaine (PF) (XYLOCAINE) 1 % injection 10 mL (10 mLs Infiltration Given by Other 07/18/17 1847)     Initial Impression / Assessment and Plan / ED Course  I have reviewed the triage vital signs and the nursing notes.  Pertinent labs & imaging results that were available during my care of the patient were reviewed by me and considered in my medical decision making (see chart for details).     This  is a 72 y.o. Female who presents to the emergency department complaining of worsening right knee pain today. She reports she was walking when she felt a pop and worse pain.  She denies any recent known trauma or falls.  She reports she has intermittent problems with  right knee pain and swelling.  She reports the swelling to her right knee is worse today.  She reports worsening pain with any movement.  She denies history of a septic joint.  She had no fevers or redness to her knee.   On exam the patient is afebrile nontoxic-appearing.  She is neurovascular intact.  She has a large joint effusion on the right.  No overlying  skin changes.  She has some range of motion of her right knee albeit with pain.  X-ray shows a moderate sized right knee fracture. I offered joint fluid aspiration to the patient.  She agrees with plan.  My initial attempt was not successful.  After changing position there is successful joint fluid aspiration of her right knee. This was bloody.  I suspect this is related to the patient being on Eliquis.  This would account for the patient's worsening joint swelling today.  60 mL's of bloody joint fluid were obtained.  After discussion with my attending, we decided not to send this for fluid analysis due to this not likely being septic joint and test results will be very skewed due to this being very bloody.  The cause of her joint pain and inflammation is due to this hemarthrosis. Will provide with knee sleeve and tylenol. She is feeling better after procedure.  I encouraged her to follow-up closely with orthopedics and with her primary care doctor.  I encouraged her to continue taking the Eliquis as prescribed. I advised the patient to follow-up with their primary care provider this week. I advised the patient to return to the emergency department with new or worsening symptoms or new concerns. The patient verbalized understanding and agreement with plan.   This patient was discussed with and evaluated by Dr. Roderic Palau who agrees with assessment and plan.   Final Clinical Impressions(s) / ED Diagnoses   Final diagnoses:  Hemarthrosis  Acute pain of right knee  Anticoagulated    ED Discharge Orders        Ordered    acetaminophen (TYLENOL) 325 MG  tablet  Every 6 hours PRN     07/18/17 1926       Waynetta Pean, PA-C 07/18/17 Luretha Murphy, MD 07/18/17 [phone]

## 2017-07-18 NOTE — ED Notes (Addendum)
EDP at bedside for arthrocentesis. Consent form signed by patient, witnessed by this RN, Will, PA, and pt family

## 2017-07-18 NOTE — ED Notes (Signed)
ED Provider at bedside. 

## 2017-07-18 NOTE — ED Notes (Signed)
Pt in XR at this time. Per Einar Pheasant, in XR will transport pt to A10 when finished with scan

## 2017-07-18 NOTE — ED Notes (Signed)
Dr. Roderic Palau at the bedside

## 2017-07-18 NOTE — Discharge Instructions (Signed)
Your increased knee swelling was due to blood in your knee joint- likely due to your blood thinner Eliquis. Please do not stop taking this medication. Please follow up with your regular doctor and with orthopedics.

## 2017-07-18 NOTE — ED Notes (Signed)
Pt requested something stronger for pain; EDP at bedside to explain reasoning behind no narcotics; EDP to write new Rx for topical cream-Monique,RN

## 2017-07-18 NOTE — ED Triage Notes (Signed)
Per Pt, Pt is coming from home with right knee pain that started today while she was at work. Pt reports this happening before in the past. Swelling noted at the right knee. Denies any radiation of the pain.

## 2017-07-21 ENCOUNTER — Other Ambulatory Visit: Payer: Self-pay

## 2017-07-21 ENCOUNTER — Emergency Department (HOSPITAL_COMMUNITY): Payer: Medicare Other

## 2017-07-21 ENCOUNTER — Encounter (HOSPITAL_COMMUNITY): Payer: Self-pay

## 2017-07-21 DIAGNOSIS — I130 Hypertensive heart and chronic kidney disease with heart failure and stage 1 through stage 4 chronic kidney disease, or unspecified chronic kidney disease: Secondary | ICD-10-CM | POA: Insufficient documentation

## 2017-07-21 DIAGNOSIS — M25061 Hemarthrosis, right knee: Secondary | ICD-10-CM | POA: Insufficient documentation

## 2017-07-21 DIAGNOSIS — N183 Chronic kidney disease, stage 3 (moderate): Secondary | ICD-10-CM | POA: Diagnosis not present

## 2017-07-21 DIAGNOSIS — I5043 Acute on chronic combined systolic (congestive) and diastolic (congestive) heart failure: Secondary | ICD-10-CM | POA: Insufficient documentation

## 2017-07-21 DIAGNOSIS — Z87891 Personal history of nicotine dependence: Secondary | ICD-10-CM | POA: Insufficient documentation

## 2017-07-21 DIAGNOSIS — Z79899 Other long term (current) drug therapy: Secondary | ICD-10-CM | POA: Insufficient documentation

## 2017-07-21 DIAGNOSIS — M25561 Pain in right knee: Secondary | ICD-10-CM | POA: Diagnosis present

## 2017-07-21 LAB — BASIC METABOLIC PANEL
ANION GAP: 12 (ref 5–15)
BUN: 28 mg/dL — AB (ref 6–20)
CO2: 22 mmol/L (ref 22–32)
Calcium: 8.6 mg/dL — ABNORMAL LOW (ref 8.9–10.3)
Chloride: 105 mmol/L (ref 101–111)
Creatinine, Ser: 1.9 mg/dL — ABNORMAL HIGH (ref 0.44–1.00)
GFR calc Af Amer: 29 mL/min — ABNORMAL LOW (ref >60)
GFR calc non Af Amer: 25 mL/min — ABNORMAL LOW (ref >60)
Glucose, Bld: 123 mg/dL — ABNORMAL HIGH (ref 65–99)
POTASSIUM: 3.6 mmol/L (ref 3.5–5.1)
SODIUM: 139 mmol/L (ref 135–145)

## 2017-07-21 LAB — I-STAT TROPONIN, ED: Troponin i, poc: 0.04 ng/mL (ref 0.00–0.08)

## 2017-07-21 LAB — CBC
HEMATOCRIT: 29.1 % — AB (ref 36.0–46.0)
Hemoglobin: 9.1 g/dL — ABNORMAL LOW (ref 12.0–15.0)
MCH: 24.1 pg — ABNORMAL LOW (ref 26.0–34.0)
MCHC: 31.3 g/dL (ref 30.0–36.0)
MCV: 77.2 fL — ABNORMAL LOW (ref 78.0–100.0)
PLATELETS: 218 10*3/uL (ref 150–400)
RBC: 3.77 MIL/uL — ABNORMAL LOW (ref 3.87–5.11)
WBC: 6.6 10*3/uL (ref 4.0–10.5)

## 2017-07-21 NOTE — ED Triage Notes (Signed)
Pt endorses right knee pain for several days. Pt seen here 3 days ago for same and provider attempted to draw fluid off of right knee but only collected blood. Pt's knee became swollen again today and pain returned. VSS.

## 2017-07-21 NOTE — ED Notes (Signed)
Family to nurse first and states pt is now having chest pain.  Pt took back to triage.

## 2017-07-21 NOTE — ED Triage Notes (Signed)
Pt brought back to triage b/c pt stated she started having CP. 10/10. Informed pt we will be doing an EKG and blood work then will return to waiting room. Pt states "how much longer do I have to wait"

## 2017-07-22 ENCOUNTER — Emergency Department (HOSPITAL_COMMUNITY)
Admission: EM | Admit: 2017-07-22 | Discharge: 2017-07-22 | Disposition: A | Discharge status: Home / Self Care | Payer: Medicare Other | Attending: Emergency Medicine | Admitting: Emergency Medicine

## 2017-07-22 DIAGNOSIS — M25061 Hemarthrosis, right knee: Secondary | ICD-10-CM

## 2017-07-22 LAB — I-STAT TROPONIN, ED: TROPONIN I, POC: 0.06 ng/mL (ref 0.00–0.08)

## 2017-07-22 MED ORDER — HYDROCODONE-ACETAMINOPHEN 5-325 MG PO TABS
2.0000 | ORAL_TABLET | Freq: Once | ORAL | Status: AC
  Administered 2017-07-22: 2 via ORAL
  Filled 2017-07-22: qty 2

## 2017-07-22 MED ORDER — LIDOCAINE HCL (PF) 1 % IJ SOLN
5.0000 mL | Freq: Once | INTRAMUSCULAR | Status: AC
  Administered 2017-07-22: 5 mL via INTRADERMAL
  Filled 2017-07-22: qty 5

## 2017-07-22 MED ORDER — HYDROCODONE-ACETAMINOPHEN 5-325 MG PO TABS: 1.0000 | ORAL_TABLET | Freq: Four times a day (QID) | ORAL | 0 refills | Status: DC | PRN

## 2017-07-22 NOTE — ED Provider Notes (Signed)
[facility] EMERGENCY DEPARTMENT Provider Note   CSN: [id number] Arrival date & time: 07/21/17  1813     History   Chief Complaint Chief Complaint  Patient presents with  . Knee Pain  . Chest Pain    HPI Stephanie Yates is a 72 y.o. female.  Patient is a 72 year old female with past medical history of chronic renal insufficiency, CHF with pacer/defibrillator on Eliquis.  She presents today for evaluation of right knee pain and swelling.  She was seen here with similar complaints 3 days ago and had 60 cc of blood taken off of her knee.  She returns today with recurrent pain and swelling.  She denies any fevers or chills.  She denies any redness.  While in the waiting room, she experienced an episode of chest discomfort.  An EKG was performed showing no change and an initial troponin was negative.   The history is provided by the patient.  Knee Pain   This is a recurrent problem. The current episode started yesterday. The problem occurs constantly. The problem has been rapidly worsening. The pain is moderate. She has tried nothing for the symptoms. The treatment provided no relief.    Past Medical History:  Diagnosis Date  . Chronic renal insufficiency, stage III (moderate) (HCC)    Cr 2.0 5/15  . Chronic systolic CHF (congestive heart failure) ([place])   . History of tobacco abuse   . Hyperlipidemia   . Hypertension   . Nonischemic cardiomyopathy ([place])    EF 10-15% on 11/2012 cath  . Sinus bradycardia 12/21/2012  . Ventricular tachycardia ([place]) 12/14/2012    Patient Active Problem List   Diagnosis Date Noted  . Acute GI bleeding 06/15/2017  . Chronic anticoagulation   . Acute on chronic congestive heart failure ([place])   . HLD (hyperlipidemia) 03/05/2017  . CKD (chronic kidney disease), stage III ([place]) 03/05/2017  . Chest pain 03/05/2017  . Symptomatic anemia 03/05/2017  . PAF (paroxysmal atrial fibrillation) ([place]) 04/01/2013  . ICD (implantable  cardioverter-defibrillator), dual, [employer]es 04/01/2013  . PVC's (premature ventricular contractions) 04/01/2013  . Hypokalemia 12/21/2012  . History of tobacco abuse 12/21/2012  . Elevated troponin 12/21/2012  . Sinus bradycardia 12/21/2012  . Hyperkalemia 12/20/2012  . Nonischemic cardiomyopathy ([place]) 12/20/2012  . UTI (urinary tract infection) 12/20/2012  . Acute on chronic combined systolic and diastolic CHF (congestive heart failure) ([place]) 12/20/2012  . Ventricular tachycardia ([place]) 12/14/2012  . Hypertension 12/14/2012    Past Surgical History:  Procedure Laterality Date  . ESOPHAGOGASTRODUODENOSCOPY (EGD) WITH PROPOFOL N/A 06/18/2017   Procedure: ESOPHAGOGASTRODUODENOSCOPY (EGD) WITH PROPOFOL;  Surgeon: Ronnette Juniper, MD;  Location: [facility];  Service: Gastroenterology;  Laterality: N/A;  . IMPLANTABLE CARDIOVERTER DEFIBRILLATOR IMPLANT  12/19/2012   St. Jude dual-chamber ICD, serial number [id number]  . IMPLANTABLE CARDIOVERTER DEFIBRILLATOR IMPLANT N/A 12/19/2012   Procedure: IMPLANTABLE CARDIOVERTER DEFIBRILLATOR IMPLANT;  Surgeon: Deboraha Sprang, MD;  Location: [facility] CATH LAB;  Service: Cardiovascular;  Laterality: N/A;  . LEFT AND RIGHT HEART CATHETERIZATION WITH CORONARY ANGIOGRAM N/A 12/17/2012   Procedure: LEFT AND RIGHT HEART CATHETERIZATION WITH CORONARY ANGIOGRAM;  Surgeon: Sherren Mocha, MD;  Location: [facility] CATH LAB;  Service: Cardiovascular;  Laterality: N/A;    OB History    No data available       Home Medications    Prior to Admission medications   Medication Sig Start Date End Date Taking? Authorizing Provider  acetaminophen (TYLENOL) 325 MG tablet Take 2 tablets (650 mg  total) by mouth every 6 (six) hours as needed for mild pain or moderate pain. 07/18/17   Waynetta Pean, PA-C  amiodarone (PACERONE) 200 MG tablet Take 1 tablet (200 mg total) by mouth daily. 06/29/17   Deboraha Sprang, MD  apixaban (ELIQUIS) 2.5 MG TABS tablet Take 1 tablet (2.5 mg total) by mouth  2 (two) times daily. 03/13/17   Hongalgi, Lenis Dickinson, MD  atorvastatin (LIPITOR) 40 MG tablet Take 1 tablet (40 mg total) by mouth daily at 6 PM. 03/13/17   Hongalgi, Lenis Dickinson, MD  carvedilol (COREG) 6.25 MG tablet Take 1 tablet (6.25 mg total) by mouth 2 (two) times daily with a meal. 03/13/17   Hongalgi, Lenis Dickinson, MD  Cholecalciferol (VITAMIN D-3 PO) Take 1,000 Units by mouth 2 (two) times daily.     [provider]  Diclofenac Sodium 3 % GEL Place 1 application onto the skin 2 (two) times daily. To affected area. 07/18/17   Waynetta Pean, PA-C  furosemide (LASIX) 40 MG tablet Take 1 tablet (40 mg total) by mouth daily. 03/13/17   Hongalgi, Lenis Dickinson, MD  HYDROcodone-acetaminophen (NORCO/VICODIN) 5-325 MG tablet Take 1 tablet by mouth 2 (two) times daily as needed. for pain 04/25/17   [provider]  pantoprazole (PROTONIX) 40 MG tablet Take 1 tablet (40 mg total) by mouth 2 (two) times daily before a meal. 06/18/17   Lavina Hamman, MD    Family History Family History  Problem Relation Age of Onset  . Cancer Mother     Social History Social History   Tobacco Use  . Smoking status: Former Research scientist (life sciences)  . Smokeless tobacco: Never Used  Substance Use Topics  . Alcohol use: No  . Drug use: No     Allergies   Strawberry extract   Review of Systems Review of Systems  All other systems reviewed and are negative.    Physical Exam Updated Vital Signs BP (!) 163/66   Pulse 61   Temp 98 F (36.7 C) (Oral)   Resp 16   Ht 5\' 6"  (1.676 m)   Wt 65.8 kg (145 lb)   SpO2 99%   BMI 23.40 kg/m   Physical Exam  Constitutional: She is oriented to person, place, and time. She appears well-developed and well-nourished. No distress.  HENT:  Head: Normocephalic and atraumatic.  Neck: Normal range of motion. Neck supple.  Cardiovascular: Normal rate and regular rhythm. Exam reveals no gallop and no friction rub.  No murmur heard. Pulmonary/Chest: Effort normal and breath sounds  normal. No respiratory distress. She has no wheezes.  Abdominal: Soft. Bowel sounds are normal. She exhibits no distension. There is no tenderness.  Musculoskeletal: Normal range of motion.  The right knee has a palpable effusion.  There is no redness or warmth.  Range of motion is limited secondary to pain.  Neurological: She is alert and oriented to person, place, and time.  Skin: Skin is warm and dry. She is not diaphoretic.  Nursing note and vitals reviewed.    ED Treatments / Results  Labs (all labs ordered are listed, but only abnormal results are displayed) Labs Reviewed  BASIC METABOLIC PANEL - Abnormal; Notable for the following components:      Result Value   Glucose, Bld 123 (*)    BUN 28 (*)    Creatinine, Ser 1.90 (*)    Calcium 8.6 (*)    GFR calc non Af Amer 25 (*)    GFR calc Af Wyvonnia Lora  29 (*)    All other components within normal limits  CBC - Abnormal; Notable for the following components:   RBC 3.77 (*)    Hemoglobin 9.1 (*)    HCT 29.1 (*)    MCV 77.2 (*)    MCH 24.1 (*)    All other components within normal limits  I-STAT TROPONIN, ED  I-STAT TROPONIN, ED    EKG  EKG Interpretation  Date/Time:  Friday July 21 2017 19:37:37 EST Ventricular Rate:  61 PR Interval:  222 QRS Duration: 126 QT Interval:  482 QTC Calculation: 485 R Axis:   -34 Text Interpretation:  Sinus rhythm with 1st degree A-V block Left axis deviation Non-specific intra-ventricular conduction block Possible Inferior infarct , age undetermined Cannot rule out Anterior infarct , age undetermined Abnormal ECG Confirmed by Veryl Speak [phone]) on 07/22/2017 12:40:23 AM       Radiology Dg Chest 2 View  Result Date: 07/21/2017 CLINICAL DATA:  Left-sided chest pain EXAM: CHEST  2 VIEW COMPARISON:  03/04/2017 FINDINGS: Left-sided duo lead pacing device as before. Cardiomegaly with vascular congestion and mild diffuse interstitial opacity consistent with mild edema. Small pleural  effusions. Aortic atherosclerosis. No pneumothorax. IMPRESSION: Cardiomegaly with vascular congestion, mild pulmonary edema and small pleural effusions. Electronically Signed   By: Donavan Foil M.D.   On: 07/21/2017 20:42    Procedures Procedures (including critical care time)  Medications Ordered in ED Medications  lidocaine (PF) (XYLOCAINE) 1 % injection 5 mL (not administered)     Initial Impression / Assessment and Plan / ED Course  I have reviewed the triage vital signs and the nursing notes.  Pertinent labs & imaging results that were available during my care of the patient were reviewed by me and considered in my medical decision making (see chart for details).  Patient returns here with recurrent right knee hemarthrosis.  She was seen 3 nights ago and had an arthrocentesis with 60 cc of blood removed.  It has since recurred.  Arthrocentesis this evening reveals 60 cc of dark bloody fluid.  She will be wrapped in an Ace bandage, given pain medicine, and follow-up with orthopedics.   ARTHOCENTESIS Performed by: Veryl Speak Consent: Verbal consent obtained. Risks and benefits: risks, benefits and alternatives were discussed Consent given by: patient Required items: required blood products, implants, devices, and special equipment available Patient identity confirmed: verbally with patient Time out: Immediately prior to procedure a "time out" was called to verify the correct patient, procedure, equipment, support staff and site/side marked as required. Indications: Right knee effusion/hemarthrosis Joint: Right knee Local anesthesia used: 1% lidocaine Preparation: Patient was prepped and draped in the usual sterile fashion. Aspirate appearance: Bloody Aspirate amount: 60 ml Patient tolerance: Patient tolerated the procedure well with no immediate complications.   While she was here she experienced a brief episode of chest discomfort in the waiting room.  Her troponin x2 is  negative and her EKG is unchanged.  She is now feeling better and I believe is appropriate for discharge.    Final Clinical Impressions(s) / ED Diagnoses   Final diagnoses:  None    ED Discharge Orders    None       Veryl Speak, MD 07/22/17 [phone]

## 2017-07-22 NOTE — Discharge Instructions (Signed)
Wear Ace bandage for comfort.  Rest your knee as much as possible until followed up by orthopedics.  Follow-up with orthopedics in the next several days.  Call on Monday to make these arrangements.

## 2017-08-10 ENCOUNTER — Encounter: Payer: Medicare Other | Admitting: Internal Medicine

## 2017-08-24 ENCOUNTER — Ambulatory Visit (INDEPENDENT_AMBULATORY_CARE_PROVIDER_SITE_OTHER): Payer: Medicare Other | Admitting: Internal Medicine

## 2017-08-24 ENCOUNTER — Encounter: Payer: Self-pay | Admitting: Internal Medicine

## 2017-08-24 VITALS — BP 130/56 | HR 61 | Ht 62.0 in | Wt 146.0 lb

## 2017-08-24 DIAGNOSIS — I480 Paroxysmal atrial fibrillation: Secondary | ICD-10-CM | POA: Diagnosis not present

## 2017-08-24 DIAGNOSIS — I428 Other cardiomyopathies: Secondary | ICD-10-CM | POA: Diagnosis not present

## 2017-08-24 DIAGNOSIS — Z95810 Presence of automatic (implantable) cardiac defibrillator: Secondary | ICD-10-CM

## 2017-08-24 LAB — CUP PACEART INCLINIC DEVICE CHECK
Battery Remaining Longevity: 55 mo
Brady Statistic RV Percent Paced: 0.87 %
HighPow Impedance: 50.625
Implantable Lead Implant Date: 20140625
Implantable Lead Location: 753860
Implantable Pulse Generator Implant Date: 20140625
Lead Channel Impedance Value: 562.5 Ohm
Lead Channel Pacing Threshold Amplitude: 0.75 V
Lead Channel Pacing Threshold Amplitude: 1.75 V
Lead Channel Pacing Threshold Amplitude: 1.75 V
Lead Channel Pacing Threshold Pulse Width: 0.5 ms
Lead Channel Pacing Threshold Pulse Width: 0.9 ms
Lead Channel Pacing Threshold Pulse Width: 0.9 ms
Lead Channel Sensing Intrinsic Amplitude: 4.4 mV
Lead Channel Setting Pacing Amplitude: 4 V
Lead Channel Setting Pacing Pulse Width: 0.9 ms
Lead Channel Setting Sensing Sensitivity: 0.5 mV
MDC IDC LEAD IMPLANT DT: 20140625
MDC IDC LEAD LOCATION: 753859
MDC IDC MSMT LEADCHNL RA IMPEDANCE VALUE: 375 Ohm
MDC IDC MSMT LEADCHNL RA PACING THRESHOLD AMPLITUDE: 0.75 V
MDC IDC MSMT LEADCHNL RA PACING THRESHOLD PULSEWIDTH: 0.5 ms
MDC IDC MSMT LEADCHNL RV SENSING INTR AMPL: 11.8 mV
MDC IDC PG SERIAL: 7093938
MDC IDC SESS DTM: 20190228155939
MDC IDC SET LEADCHNL RA PACING AMPLITUDE: 2 V
MDC IDC STAT BRADY RA PERCENT PACED: 16 %

## 2017-08-24 NOTE — Progress Notes (Signed)
Patient Care Team: Josetta Huddle, MD as PCP - General (Internal Medicine)   HPI  Stephanie Yates is a 72 y.o. female Seen in follow-up for ventricular tachycardia in the context of nonischemic cardiomyopathy.  She was treated initially with amiodarone.  She received an ICD for secondary prevention.  She is also had atrial fibrillation has been on apixaban.  She has been doing relatively well.  She has intermittent problems with shortness of breath.  She also has peripheral edema.  She has not noted an association between the 2.  Denies cough or nausea.  Her diet is not particularly restricted of salt or water.  She is still working 5 days a week  Date cr K TSH LFTs PFTs  9/18    1.68  70    12/18  1.9 3.6  21     DATE TEST EF   9/18 Echo   20-25 % Mild LVH       %           Records and Results Reviewed   Past Medical History:  Diagnosis Date  . Chronic renal insufficiency, stage III (moderate) (HCC)    Cr 2.0 5/15  . Chronic systolic CHF (congestive heart failure) ([place])   . History of tobacco abuse   . Hyperlipidemia   . Hypertension   . Nonischemic cardiomyopathy ([place])    EF 10-15% on 11/2012 cath  . Sinus bradycardia 12/21/2012  . Ventricular tachycardia ([place]) 12/14/2012    Past Surgical History:  Procedure Laterality Date  . ESOPHAGOGASTRODUODENOSCOPY (EGD) WITH PROPOFOL N/A 06/18/2017   Procedure: ESOPHAGOGASTRODUODENOSCOPY (EGD) WITH PROPOFOL;  Surgeon: Ronnette Juniper, MD;  Location: [facility];  Service: Gastroenterology;  Laterality: N/A;  . IMPLANTABLE CARDIOVERTER DEFIBRILLATOR IMPLANT  12/19/2012   St. Jude dual-chamber ICD, serial number [id number]  . IMPLANTABLE CARDIOVERTER DEFIBRILLATOR IMPLANT N/A 12/19/2012   Procedure: IMPLANTABLE CARDIOVERTER DEFIBRILLATOR IMPLANT;  Surgeon: Deboraha Sprang, MD;  Location: [facility] CATH LAB;  Service: Cardiovascular;  Laterality: N/A;  . LEFT AND RIGHT HEART CATHETERIZATION WITH CORONARY ANGIOGRAM N/A 12/17/2012   Procedure: LEFT AND RIGHT HEART CATHETERIZATION WITH CORONARY ANGIOGRAM;  Surgeon: Sherren Mocha, MD;  Location: [facility] CATH LAB;  Service: Cardiovascular;  Laterality: N/A;    Current Outpatient Medications  Medication Sig Dispense Refill  . acetaminophen (TYLENOL) 325 MG tablet Take 2 tablets (650 mg total) by mouth every 6 (six) hours as needed for mild pain or moderate pain. 60 tablet 0  . amiodarone (PACERONE) 200 MG tablet Take 1 tablet (200 mg total) by mouth daily. 90 tablet 2  . apixaban (ELIQUIS) 2.5 MG TABS tablet Take 1 tablet (2.5 mg total) by mouth 2 (two) times daily. 60 tablet 0  . atorvastatin (LIPITOR) 40 MG tablet Take 1 tablet (40 mg total) by mouth daily at 6 PM. 30 tablet 0  . carvedilol (COREG) 6.25 MG tablet Take 1 tablet (6.25 mg total) by mouth 2 (two) times daily with a meal. 60 tablet 0  . Cholecalciferol (VITAMIN D-3 PO) Take 1,000 Units by mouth 2 (two) times daily.     . Diclofenac Sodium 3 % GEL Place 1 application onto the skin 2 (two) times daily. To affected area. 100 g 0  . furosemide (LASIX) 40 MG tablet Take 1 tablet (40 mg total) by mouth daily. 30 tablet 0  . HYDROcodone-acetaminophen (NORCO) 5-325 MG tablet Take 1-2 tablets by mouth every 6 (six) hours as needed. 12 tablet 0  . pantoprazole (PROTONIX)  40 MG tablet Take 1 tablet (40 mg total) by mouth 2 (two) times daily before a meal. 120 tablet 0   No current facility-administered medications for this visit.     Allergies  Allergen Reactions  . Strawberry Extract Hives, Itching, Swelling and Other (See Comments)    Hives and itching       Review of Systems negative except from HPI and PMH  Physical Exam BP (!) 130/56   Pulse 61   Ht 5\' 2"  (1.575 m)   Wt 146 lb (66.2 kg)   BMI 26.70 kg/m  Well developed and nourished in no acute distress HENT normal Neck supple with JVP-8 Clear Regular rate and rhythm, no murmurs or gallops Abd-soft with active BS No Clubbing cyanosis 2+edema Skin-warm  and dry A & Oriented  Grossly normal sensory and motor function  Sinus at 61 21/12/46 Axis left -47 Q waves inferolaterally Low voltage  Assessment and  Plan  Aborted cardiac arrest  Nonischemic cardiomyopathy  Congestive heart failure-chronic-systolic-class IIb  Renal insufficiency grade 4  Anemia  Atrial fibrillation-paroxysmal  Low voltage limb leads with mild left ventricular hypertrophy  No intercurrent Ventricular tachycardia  No intercurrent atrial fibrillation or flutter  She is volume overloaded.  We will undertake a modest increase in her furosemide to 60-80 x 3 days.  She will let us know if she diuresis or not.  We will plan to recheck her laboratories next week both for amiodarone as well as for renal function.  She is anemic.  This is being followed by Dr. Inda Merlin.  She is on iron replacement.  Renal insufficiency may be a challenge at [place].  She may be can sign ongoing edema.  We spent more than 50% of our >25 min visit in face to face counseling regarding the above    Current medicines are reviewed at length with the patient today .  The patient does not have concerns regarding medicines.

## 2017-08-24 NOTE — Patient Instructions (Addendum)
Medication Instructions:   Take 80mg  of Lasix for 3 days  Labwork: CMET, CBC and TSH next Monday or Tuesday  Testing/Procedures: None ordered.  Follow-Up: Your physician recommends that you schedule a follow-up appointment in:   6 months with Tommye Standard, PA  Remote monitoring is used to monitor your Pacemaker from home. This monitoring reduces the number of office visits required to check your device to one time per year. It allows [place] to keep an eye on the functioning of your device to ensure it is working properly. You are scheduled for a device check from home on 11/09/2017. You may send your transmission at any time that day. If you have a wireless device, the transmission will be sent automatically. After your physician reviews your transmission, you will receive a postcard with your next transmission date.    Any Other Special Instructions Will Be Listed Below (If Applicable).     If you need a refill on your cardiac medications before your next appointment, please call your pharmacy.

## 2017-08-25 ENCOUNTER — Encounter: Payer: Medicare Other | Admitting: Physician Assistant

## 2017-08-29 ENCOUNTER — Other Ambulatory Visit: Payer: Medicare Other

## 2017-08-30 ENCOUNTER — Other Ambulatory Visit: Payer: Medicare Other | Admitting: *Deleted

## 2017-08-30 DIAGNOSIS — Z95810 Presence of automatic (implantable) cardiac defibrillator: Secondary | ICD-10-CM

## 2017-08-30 DIAGNOSIS — I428 Other cardiomyopathies: Secondary | ICD-10-CM

## 2017-08-30 DIAGNOSIS — I480 Paroxysmal atrial fibrillation: Secondary | ICD-10-CM

## 2017-08-31 LAB — CBC WITH DIFFERENTIAL/PLATELET
BASOS: 1 %
Basophils Absolute: 0 10*3/uL (ref 0.0–0.2)
EOS (ABSOLUTE): 0.2 10*3/uL (ref 0.0–0.4)
Eos: 3 %
HEMATOCRIT: 30.5 % — AB (ref 34.0–46.6)
Hemoglobin: 9.6 g/dL — ABNORMAL LOW (ref 11.1–15.9)
IMMATURE GRANS (ABS): 0 10*3/uL (ref 0.0–0.1)
Immature Granulocytes: 0 %
LYMPHS: 26 %
Lymphocytes Absolute: 1.7 10*3/uL (ref 0.7–3.1)
MCH: 25.1 pg — AB (ref 26.6–33.0)
MCHC: 31.5 g/dL (ref 31.5–35.7)
MCV: 80 fL (ref 79–97)
Monocytes Absolute: 1 10*3/uL — ABNORMAL HIGH (ref 0.1–0.9)
Monocytes: 15 %
NEUTROS ABS: 3.8 10*3/uL (ref 1.4–7.0)
Neutrophils: 55 %
Platelets: 184 10*3/uL (ref 150–379)
RBC: 3.83 x10E6/uL (ref 3.77–5.28)
RDW: 20.5 % — ABNORMAL HIGH (ref 12.3–15.4)
WBC: 6.8 10*3/uL (ref 3.4–10.8)

## 2017-08-31 LAB — COMPREHENSIVE METABOLIC PANEL
A/G RATIO: 1 — AB (ref 1.2–2.2)
ALBUMIN: 3.5 g/dL (ref 3.5–4.8)
ALT: 14 IU/L (ref 0–32)
AST: 40 IU/L (ref 0–40)
Alkaline Phosphatase: 109 IU/L (ref 39–117)
BILIRUBIN TOTAL: 1.2 mg/dL (ref 0.0–1.2)
BUN / CREAT RATIO: 22 (ref 12–28)
BUN: 38 mg/dL — ABNORMAL HIGH (ref 8–27)
CHLORIDE: 104 mmol/L (ref 96–106)
CO2: 21 mmol/L (ref 20–29)
Calcium: 8.9 mg/dL (ref 8.7–10.3)
Creatinine, Ser: 1.73 mg/dL — ABNORMAL HIGH (ref 0.57–1.00)
GFR calc non Af Amer: 29 mL/min/{1.73_m2} — ABNORMAL LOW (ref >59)
GFR, EST AFRICAN AMERICAN: 33 mL/min/{1.73_m2} — AB (ref >59)
Globulin, Total: 3.5 g/dL (ref 1.5–4.5)
Glucose: 85 mg/dL (ref 65–99)
POTASSIUM: 3.9 mmol/L (ref 3.5–5.2)
Sodium: 142 mmol/L (ref 134–144)
Total Protein: 7 g/dL (ref 6.0–8.5)

## 2017-08-31 LAB — TSH: TSH: 2.74 u[IU]/mL (ref 0.450–4.500)

## 2018-01-02 ENCOUNTER — Other Ambulatory Visit: Payer: Self-pay | Admitting: General Surgery

## 2018-01-02 DIAGNOSIS — K828 Other specified diseases of gallbladder: Secondary | ICD-10-CM

## 2018-02-20 ENCOUNTER — Ambulatory Visit (INDEPENDENT_AMBULATORY_CARE_PROVIDER_SITE_OTHER): Payer: Medicare Other | Admitting: Internal Medicine

## 2018-02-20 ENCOUNTER — Encounter: Payer: Self-pay | Admitting: Internal Medicine

## 2018-02-20 VITALS — BP 138/68 | HR 50 | Ht 66.0 in | Wt 140.2 lb

## 2018-02-20 DIAGNOSIS — Z95810 Presence of automatic (implantable) cardiac defibrillator: Secondary | ICD-10-CM

## 2018-02-20 DIAGNOSIS — I480 Paroxysmal atrial fibrillation: Secondary | ICD-10-CM

## 2018-02-20 DIAGNOSIS — I5022 Chronic systolic (congestive) heart failure: Secondary | ICD-10-CM | POA: Diagnosis not present

## 2018-02-20 DIAGNOSIS — I428 Other cardiomyopathies: Secondary | ICD-10-CM

## 2018-02-20 DIAGNOSIS — I10 Essential (primary) hypertension: Secondary | ICD-10-CM

## 2018-02-20 NOTE — Progress Notes (Signed)
Patient Care Team: Josetta Huddle, MD as PCP - General (Internal Medicine)   HPI  Stephanie Yates is a 72 y.o. female Seen in follow-up for ventricular tachycardia in the context of nonischemic cardiomyopathy. She was treated initially with amiodarone.  She received an ICD for secondary prevention.  She is also had atrial fibrillation has been on apixaban.   She is still working 5 days a week   Amiodarone has been intercurrently discontinued perhaps by her PCP  Date cr K TSH LFTs PFTs  9/18    1.68  70    12/18  1.9 3.6  21    3/19  1.25 3.2              DATE TEST EF   9/18 Echo   20-25 % Mild LVH       %         Denies chest pain shortness of breath lightheadedness or peripheral edema.  Records and Results Reviewed   Past Medical History:  Diagnosis Date  . Chronic renal insufficiency, stage III (moderate) (HCC)    Cr 2.0 5/15  . Chronic systolic CHF (congestive heart failure) ([place])   . History of tobacco abuse   . Hyperlipidemia   . Hypertension   . Nonischemic cardiomyopathy ([place])    EF 10-15% on 11/2012 cath  . Sinus bradycardia 12/21/2012  . Ventricular tachycardia ([place]) 12/14/2012    Past Surgical History:  Procedure Laterality Date  . ESOPHAGOGASTRODUODENOSCOPY (EGD) WITH PROPOFOL N/A 06/18/2017   Procedure: ESOPHAGOGASTRODUODENOSCOPY (EGD) WITH PROPOFOL;  Surgeon: Ronnette Juniper, MD;  Location: [place];  Service: Gastroenterology;  Laterality: N/A;  . IMPLANTABLE CARDIOVERTER DEFIBRILLATOR IMPLANT  12/19/2012   St. Jude dual-chamber ICD, serial number [id number]  . IMPLANTABLE CARDIOVERTER DEFIBRILLATOR IMPLANT N/A 12/19/2012   Procedure: IMPLANTABLE CARDIOVERTER DEFIBRILLATOR IMPLANT;  Surgeon: Deboraha Sprang, MD;  Location: [facility] CATH LAB;  Service: Cardiovascular;  Laterality: N/A;  . LEFT AND RIGHT HEART CATHETERIZATION WITH CORONARY ANGIOGRAM N/A 12/17/2012   Procedure: LEFT AND RIGHT HEART CATHETERIZATION WITH CORONARY ANGIOGRAM;  Surgeon: Sherren Mocha, MD;  Location: [facility] CATH LAB;  Service: Cardiovascular;  Laterality: N/A;    Current Outpatient Medications  Medication Sig Dispense Refill  . acetaminophen (TYLENOL) 325 MG tablet Take 2 tablets (650 mg total) by mouth every 6 (six) hours as needed for mild pain or moderate pain. 60 tablet 0  . atorvastatin (LIPITOR) 40 MG tablet Take 1 tablet (40 mg total) by mouth daily at 6 PM. 30 tablet 0  . carvedilol (COREG) 6.25 MG tablet Take 1 tablet (6.25 mg total) by mouth 2 (two) times daily with a meal. 60 tablet 0  . Cholecalciferol (VITAMIN D-3 PO) Take 1,000 Units by mouth 2 (two) times daily.     . Diclofenac Sodium 3 % GEL Place 1 application onto the skin 2 (two) times daily. To affected area. 100 g 0  . furosemide (LASIX) 40 MG tablet Take 60 mg by mouth daily.    Marland Kitchen HYDROcodone-acetaminophen (NORCO) 5-325 MG tablet Take 1-2 tablets by mouth every 6 (six) hours as needed. 12 tablet 0  . pantoprazole (PROTONIX) 40 MG tablet Take 1 tablet (40 mg total) by mouth 2 (two) times daily before a meal. 120 tablet 0  . potassium chloride SA (K-DUR,KLOR-CON) 20 MEQ tablet Take 20 mEq by mouth 2 (two) times daily.  0   No current facility-administered medications for this visit.     Allergies  Allergen  Reactions  . Strawberry Extract Hives, Itching, Swelling and Other (See Comments)    Hives and itching       Review of Systems negative except from HPI and PMH  Physical Exam BP 138/68   Pulse (!) 50   Ht 5\' 6"  (1.676 m)   Wt 140 lb 3.2 oz (63.6 kg)   SpO2 97%   BMI 22.63 kg/m  Well developed and nourished in no acute distress HENT normal Neck supple with JVP-flat Clear Regular rate and rhythm, no murmurs or gallops Abd-soft with active BS No Clubbing cyanosis edema Skin-warm and dry A & Oriented  Grossly normal sensory and motor function    ECG atrial pacing at 50 Intervals 18/14/51   Assessment and  Plan  Aborted cardiac arrest  Nonischemic  cardiomyopathy  Congestive heart failure-chronic-systolic-class IIb  Renal insufficiency grade 4  Anemia  Atrial fibrillation-paroxysmal  Sinus node dysfunction  Low voltage limb leads with mild left ventricular hypertrophy  No intercurrent Ventricular tachycardia  No intercurrent atrial fibrillation or flutter  Still anemic  Will check ferritin  amio stopped but will check TSH  Hypokalemic  @ 3.2  will rechceck

## 2018-02-20 NOTE — Patient Instructions (Signed)
Medication Instructions:  Your physician recommends that you continue on your current medications as directed. Please refer to the Current Medication list given to you today.  Labwork: You will have labs drawn today: CBC and BMP  Testing/Procedures: None ordered.  Follow-Up: Your physician wants you to follow-up in: One Year with Dr Caryl Comes. You will receive a reminder letter in the mail two months in advance. If you don't receive a letter, please call our office to schedule the follow-up appointment.   You will follow up with the Device clinic in 6 months. Follow up with your PCP in 6 months for Eliquis labs.   Any Other Special Instructions Will Be Listed Below (If Applicable).     If you need a refill on your cardiac medications before your next appointment, please call your pharmacy.

## 2018-02-21 LAB — BASIC METABOLIC PANEL
BUN/Creatinine Ratio: 16 (ref 12–28)
BUN: 41 mg/dL — ABNORMAL HIGH (ref 8–27)
CO2: 22 mmol/L (ref 20–29)
Calcium: 9.2 mg/dL (ref 8.7–10.3)
Chloride: 105 mmol/L (ref 96–106)
Creatinine, Ser: 2.54 mg/dL — ABNORMAL HIGH (ref 0.57–1.00)
GFR calc Af Amer: 21 mL/min/{1.73_m2} — ABNORMAL LOW (ref >59)
GFR calc non Af Amer: 18 mL/min/{1.73_m2} — ABNORMAL LOW (ref >59)
Glucose: 85 mg/dL (ref 65–99)
Potassium: 5.4 mmol/L — ABNORMAL HIGH (ref 3.5–5.2)
Sodium: 141 mmol/L (ref 134–144)

## 2018-02-21 LAB — CBC
Hematocrit: 31.9 % — ABNORMAL LOW (ref 34.0–46.6)
Hemoglobin: 10.4 g/dL — ABNORMAL LOW (ref 11.1–15.9)
MCH: 27.6 pg (ref 26.6–33.0)
MCHC: 32.6 g/dL (ref 31.5–35.7)
MCV: 85 fL (ref 79–97)
Platelets: 243 10*3/uL (ref 150–450)
RBC: 3.77 x10E6/uL (ref 3.77–5.28)
RDW: 16.4 % — ABNORMAL HIGH (ref 12.3–15.4)
WBC: 5.9 10*3/uL (ref 3.4–10.8)

## 2018-02-27 ENCOUNTER — Telehealth: Payer: Self-pay

## 2018-02-27 NOTE — Telephone Encounter (Signed)
Spoke with pt. Confirmed that she will discontinue K+ and follow up with Dr Inda Merlin.

## 2018-02-27 NOTE — Telephone Encounter (Signed)
-----   Message from Deboraha Sprang, MD sent at 02/23/2018  6:06 PM EDT ----- Please Inform Patient that labs are normal x worsening renal function Does she have nephrologist -- if not she should followup with Dr Inda Merlin whom I have included on this message She ALSO NEEDS TO STOP POTASSIUM # I HAVE CALLED HER 8/30 1800 Hr and left VM   Thanks

## 2018-03-05 ENCOUNTER — Telehealth: Payer: Self-pay | Admitting: Internal Medicine

## 2018-03-05 NOTE — Telephone Encounter (Signed)
Pt called today to review her current medications. We reviewed that she has been off Amiodarone for some time now. She is to continue her coreg, lasix, eliquis, and atorvastatin. Pt is to follow up with her PCP regarding her renal fx for a referral. She also understands she is to hold her potassium at this time. There are no additional questions.

## 2018-03-05 NOTE — Telephone Encounter (Signed)
New Message:   Patient states she is having problems with her medication but she not sure which medication

## 2018-05-16 LAB — CUP PACEART INCLINIC DEVICE CHECK
Implantable Lead Implant Date: 20140625
Implantable Lead Location: 753859
Implantable Pulse Generator Implant Date: 20140625
MDC IDC LEAD IMPLANT DT: 20140625
MDC IDC LEAD LOCATION: 753860
MDC IDC PG SERIAL: 7093938
MDC IDC SESS DTM: 20191120160957

## 2018-06-07 ENCOUNTER — Encounter (HOSPITAL_COMMUNITY): Payer: Self-pay | Admitting: Emergency Medicine

## 2018-06-07 ENCOUNTER — Inpatient Hospital Stay (HOSPITAL_COMMUNITY)
Admission: EM | Admit: 2018-06-07 | Discharge: 2018-06-13 | DRG: 291 | Disposition: A | Discharge status: Home / Self Care | Payer: Medicare Other | Attending: Internal Medicine | Admitting: Internal Medicine

## 2018-06-07 ENCOUNTER — Other Ambulatory Visit: Payer: Self-pay

## 2018-06-07 ENCOUNTER — Observation Stay (HOSPITAL_BASED_OUTPATIENT_CLINIC_OR_DEPARTMENT_OTHER): Payer: Medicare Other

## 2018-06-07 ENCOUNTER — Emergency Department (HOSPITAL_COMMUNITY): Payer: Medicare Other

## 2018-06-07 DIAGNOSIS — I5043 Acute on chronic combined systolic (congestive) and diastolic (congestive) heart failure: Secondary | ICD-10-CM | POA: Diagnosis not present

## 2018-06-07 DIAGNOSIS — I472 Ventricular tachycardia, unspecified: Secondary | ICD-10-CM

## 2018-06-07 DIAGNOSIS — Z95810 Presence of automatic (implantable) cardiac defibrillator: Secondary | ICD-10-CM | POA: Diagnosis present

## 2018-06-07 DIAGNOSIS — I361 Nonrheumatic tricuspid (valve) insufficiency: Secondary | ICD-10-CM | POA: Diagnosis not present

## 2018-06-07 DIAGNOSIS — I4891 Unspecified atrial fibrillation: Secondary | ICD-10-CM

## 2018-06-07 DIAGNOSIS — I130 Hypertensive heart and chronic kidney disease with heart failure and stage 1 through stage 4 chronic kidney disease, or unspecified chronic kidney disease: Secondary | ICD-10-CM | POA: Diagnosis not present

## 2018-06-07 DIAGNOSIS — I480 Paroxysmal atrial fibrillation: Secondary | ICD-10-CM | POA: Diagnosis present

## 2018-06-07 DIAGNOSIS — I340 Nonrheumatic mitral (valve) insufficiency: Secondary | ICD-10-CM | POA: Diagnosis not present

## 2018-06-07 DIAGNOSIS — R079 Chest pain, unspecified: Secondary | ICD-10-CM | POA: Diagnosis present

## 2018-06-07 DIAGNOSIS — Z4502 Encounter for adjustment and management of automatic implantable cardiac defibrillator: Secondary | ICD-10-CM

## 2018-06-07 DIAGNOSIS — I081 Rheumatic disorders of both mitral and tricuspid valves: Secondary | ICD-10-CM | POA: Diagnosis present

## 2018-06-07 DIAGNOSIS — Z7901 Long term (current) use of anticoagulants: Secondary | ICD-10-CM

## 2018-06-07 DIAGNOSIS — N183 Chronic kidney disease, stage 3 unspecified: Secondary | ICD-10-CM | POA: Diagnosis present

## 2018-06-07 DIAGNOSIS — Z87891 Personal history of nicotine dependence: Secondary | ICD-10-CM

## 2018-06-07 DIAGNOSIS — R7401 Elevation of levels of liver transaminase levels: Secondary | ICD-10-CM | POA: Diagnosis present

## 2018-06-07 DIAGNOSIS — R5381 Other malaise: Secondary | ICD-10-CM | POA: Diagnosis present

## 2018-06-07 DIAGNOSIS — E876 Hypokalemia: Secondary | ICD-10-CM | POA: Diagnosis present

## 2018-06-07 DIAGNOSIS — I10 Essential (primary) hypertension: Secondary | ICD-10-CM | POA: Diagnosis present

## 2018-06-07 DIAGNOSIS — Z91018 Allergy to other foods: Secondary | ICD-10-CM

## 2018-06-07 DIAGNOSIS — I440 Atrioventricular block, first degree: Secondary | ICD-10-CM | POA: Diagnosis present

## 2018-06-07 DIAGNOSIS — I509 Heart failure, unspecified: Secondary | ICD-10-CM

## 2018-06-07 DIAGNOSIS — R7989 Other specified abnormal findings of blood chemistry: Secondary | ICD-10-CM

## 2018-06-07 DIAGNOSIS — E785 Hyperlipidemia, unspecified: Secondary | ICD-10-CM | POA: Diagnosis present

## 2018-06-07 DIAGNOSIS — D631 Anemia in chronic kidney disease: Secondary | ICD-10-CM | POA: Diagnosis present

## 2018-06-07 DIAGNOSIS — I495 Sick sinus syndrome: Secondary | ICD-10-CM | POA: Diagnosis present

## 2018-06-07 DIAGNOSIS — I428 Other cardiomyopathies: Secondary | ICD-10-CM | POA: Diagnosis present

## 2018-06-07 DIAGNOSIS — I959 Hypotension, unspecified: Secondary | ICD-10-CM | POA: Diagnosis present

## 2018-06-07 DIAGNOSIS — D649 Anemia, unspecified: Secondary | ICD-10-CM | POA: Diagnosis present

## 2018-06-07 DIAGNOSIS — Z79899 Other long term (current) drug therapy: Secondary | ICD-10-CM

## 2018-06-07 LAB — CBC
HCT: 31.8 % — ABNORMAL LOW (ref 36.0–46.0)
Hemoglobin: 9.6 g/dL — ABNORMAL LOW (ref 12.0–15.0)
MCH: 22.4 pg — ABNORMAL LOW (ref 26.0–34.0)
MCHC: 30.2 g/dL (ref 30.0–36.0)
MCV: 74.1 fL — ABNORMAL LOW (ref 80.0–100.0)
Platelets: 215 10*3/uL (ref 150–400)
RBC: 4.29 MIL/uL (ref 3.87–5.11)
RDW: 17.2 % — ABNORMAL HIGH (ref 11.5–15.5)
WBC: 5.8 10*3/uL (ref 4.0–10.5)
nRBC: 0 % (ref 0.0–0.2)

## 2018-06-07 LAB — MAGNESIUM: Magnesium: 1.9 mg/dL (ref 1.7–2.4)

## 2018-06-07 LAB — TROPONIN I
TROPONIN I: 0.1 ng/mL — AB (ref <0.03)
Troponin I: 0.15 ng/mL (ref <0.03)
Troponin I: 0.4 ng/mL (ref <0.03)
Troponin I: 0.72 ng/mL (ref <0.03)

## 2018-06-07 LAB — APTT
aPTT: 41 seconds — ABNORMAL HIGH (ref 24–36)
aPTT: 116 seconds — ABNORMAL HIGH (ref 24–36)

## 2018-06-07 LAB — BASIC METABOLIC PANEL
Anion gap: 11 (ref 5–15)
BUN: 29 mg/dL — ABNORMAL HIGH (ref 8–23)
CO2: 22 mmol/L (ref 22–32)
Calcium: 8.9 mg/dL (ref 8.9–10.3)
Chloride: 108 mmol/L (ref 98–111)
Creatinine, Ser: 1.91 mg/dL — ABNORMAL HIGH (ref 0.44–1.00)
GFR calc Af Amer: 29 mL/min — ABNORMAL LOW (ref >60)
GFR calc non Af Amer: 25 mL/min — ABNORMAL LOW (ref >60)
Glucose, Bld: 140 mg/dL — ABNORMAL HIGH (ref 70–99)
Potassium: 4.2 mmol/L (ref 3.5–5.1)
Sodium: 141 mmol/L (ref 135–145)

## 2018-06-07 LAB — ECHOCARDIOGRAM COMPLETE
Height: 66 in
Weight: 2400 oz

## 2018-06-07 LAB — HEPARIN LEVEL (UNFRACTIONATED)
Heparin Unfractionated: 0.41 IU/mL (ref 0.30–0.70)
Heparin Unfractionated: 0.68 IU/mL (ref 0.30–0.70)

## 2018-06-07 LAB — BRAIN NATRIURETIC PEPTIDE: B NATRIURETIC PEPTIDE 5: 1111.3 pg/mL — AB (ref 0.0–100.0)

## 2018-06-07 MED ORDER — FUROSEMIDE 10 MG/ML IJ SOLN: 40.0000 mg | Freq: Every day | INTRAMUSCULAR | Status: DC

## 2018-06-07 MED ORDER — APIXABAN 2.5 MG PO TABS
2.5000 mg | ORAL_TABLET | Freq: Two times a day (BID) | ORAL | Status: DC
  Administered 2018-06-07: 2.5 mg via ORAL
  Filled 2018-06-07: qty 1

## 2018-06-07 MED ORDER — VITAMIN D 25 MCG (1000 UNIT) PO TABS
1000.0000 [IU] | ORAL_TABLET | Freq: Every day | ORAL | Status: DC
  Administered 2018-06-07 – 2018-06-12 (×6): 1000 [IU] via ORAL
  Filled 2018-06-07 (×6): qty 1

## 2018-06-07 MED ORDER — ONDANSETRON HCL 4 MG PO TABS: 4.0000 mg | ORAL_TABLET | Freq: Four times a day (QID) | ORAL | Status: DC | PRN

## 2018-06-07 MED ORDER — AMIODARONE IV BOLUS ONLY 150 MG/100ML: 150.0000 mg | Freq: Once | INTRAVENOUS | Status: AC

## 2018-06-07 MED ORDER — AMIODARONE LOAD VIA INFUSION
150.0000 mg | Freq: Once | INTRAVENOUS | Status: AC
  Administered 2018-06-07: 150 mg via INTRAVENOUS
  Filled 2018-06-07: qty 83.34

## 2018-06-07 MED ORDER — ATORVASTATIN CALCIUM 40 MG PO TABS
40.0000 mg | ORAL_TABLET | Freq: Every day | ORAL | Status: DC
  Administered 2018-06-07 – 2018-06-12 (×6): 40 mg via ORAL
  Filled 2018-06-07 (×6): qty 1

## 2018-06-07 MED ORDER — AMIODARONE HCL IN DEXTROSE 360-4.14 MG/200ML-% IV SOLN
60.0000 mg/h | INTRAVENOUS | Status: AC
  Administered 2018-06-07 (×2): 60 mg/h via INTRAVENOUS
  Filled 2018-06-07: qty 200

## 2018-06-07 MED ORDER — FUROSEMIDE 10 MG/ML IJ SOLN
40.0000 mg | Freq: Once | INTRAMUSCULAR | Status: AC
  Administered 2018-06-07: 40 mg via INTRAVENOUS
  Filled 2018-06-07: qty 4

## 2018-06-07 MED ORDER — ALPRAZOLAM 0.25 MG PO TABS
0.2500 mg | ORAL_TABLET | Freq: Two times a day (BID) | ORAL | Status: DC | PRN
  Administered 2018-06-08 (×2): 0.25 mg via ORAL
  Filled 2018-06-07 (×2): qty 1

## 2018-06-07 MED ORDER — SODIUM CHLORIDE 0.9 % IV SOLN
INTRAVENOUS | Status: DC | PRN
  Administered 2018-06-07: 500 mL via INTRAVENOUS

## 2018-06-07 MED ORDER — CARVEDILOL 12.5 MG PO TABS
12.5000 mg | ORAL_TABLET | Freq: Two times a day (BID) | ORAL | Status: DC
  Administered 2018-06-07 – 2018-06-11 (×8): 12.5 mg via ORAL
  Filled 2018-06-07: qty 2
  Filled 2018-06-07 (×8): qty 1

## 2018-06-07 MED ORDER — AMIODARONE HCL IN DEXTROSE 360-4.14 MG/200ML-% IV SOLN
30.0000 mg/h | INTRAVENOUS | Status: DC
  Administered 2018-06-08: 30 mg/h via INTRAVENOUS
  Filled 2018-06-07: qty 200

## 2018-06-07 MED ORDER — ONDANSETRON HCL 4 MG/2ML IJ SOLN: 4.0000 mg | Freq: Four times a day (QID) | INTRAMUSCULAR | Status: DC | PRN

## 2018-06-07 MED ORDER — METOPROLOL TARTRATE 5 MG/5ML IV SOLN
5.0000 mg | Freq: Once | INTRAVENOUS | Status: AC
  Administered 2018-06-07: 5 mg via INTRAVENOUS
  Filled 2018-06-07: qty 5

## 2018-06-07 MED ORDER — FUROSEMIDE 10 MG/ML IJ SOLN
40.0000 mg | Freq: Two times a day (BID) | INTRAMUSCULAR | Status: DC
  Administered 2018-06-07 – 2018-06-08 (×2): 40 mg via INTRAVENOUS
  Filled 2018-06-07 (×2): qty 4

## 2018-06-07 MED ORDER — ACETAMINOPHEN 650 MG RE SUPP: 650.0000 mg | Freq: Four times a day (QID) | RECTAL | Status: DC | PRN

## 2018-06-07 MED ORDER — PANTOPRAZOLE SODIUM 40 MG PO TBEC
40.0000 mg | DELAYED_RELEASE_TABLET | Freq: Two times a day (BID) | ORAL | Status: DC
  Administered 2018-06-07 – 2018-06-13 (×12): 40 mg via ORAL
  Filled 2018-06-07 (×12): qty 1

## 2018-06-07 MED ORDER — HEPARIN BOLUS VIA INFUSION
2000.0000 [IU] | Freq: Once | INTRAVENOUS | Status: AC
  Administered 2018-06-07: 2000 [IU] via INTRAVENOUS
  Filled 2018-06-07: qty 2000

## 2018-06-07 MED ORDER — ACETAMINOPHEN 325 MG PO TABS
650.0000 mg | ORAL_TABLET | Freq: Four times a day (QID) | ORAL | Status: DC | PRN
  Administered 2018-06-08 – 2018-06-09 (×3): 650 mg via ORAL
  Filled 2018-06-07 (×3): qty 2

## 2018-06-07 MED ORDER — HEPARIN (PORCINE) 25000 UT/250ML-% IV SOLN
1100.0000 [IU]/h | INTRAVENOUS | Status: DC
  Administered 2018-06-07: 1100 [IU]/h via INTRAVENOUS
  Filled 2018-06-07: qty 250

## 2018-06-07 MED ORDER — PROCHLORPERAZINE EDISYLATE 10 MG/2ML IJ SOLN
5.0000 mg | INTRAMUSCULAR | Status: DC | PRN
  Filled 2018-06-07: qty 1

## 2018-06-07 MED ORDER — MAGNESIUM SULFATE IN D5W 1-5 GM/100ML-% IV SOLN
1.0000 g | Freq: Once | INTRAVENOUS | Status: AC
  Administered 2018-06-07: 1 g via INTRAVENOUS
  Filled 2018-06-07: qty 100

## 2018-06-07 MED FILL — Medication: Qty: 1 | Status: AC

## 2018-06-07 NOTE — Progress Notes (Signed)
Nursing received called from [facility] that patient was in [place] sustaining. Staff arrived to find patient in no acute distress. EKG being done now. MD paged.

## 2018-06-07 NOTE — Progress Notes (Signed)
ANTICOAGULATION CONSULT NOTE - Initial Consult  Pharmacy Consult for Heparin Indication: atrial fibrillation  Allergies  Allergen Reactions  . Strawberry Extract Hives, Itching, Swelling and Other (See Comments)    Hives and itching     Patient Measurements: Height: 5\' 6"  (167.6 cm) Weight: 150 lb (68 kg) IBW/kg (Calculated) : 59.3  Vital Signs: Temp: 97.6 F (36.4 C) (12/12 0449) Temp Source: Oral (12/12 0449) BP: 128/61 (12/12 0600) Pulse Rate: 67 (12/12 0600)  Labs: Recent Labs    06/07/18 0454  HGB 9.6*  HCT 31.8*  PLT 215    CrCl cannot be calculated (Patient's most recent lab result is older than the maximum 21 days allowed.).   Medical History: Past Medical History:  Diagnosis Date  . Chronic renal insufficiency, stage III (moderate) (HCC)    Cr 2.0 5/15  . Chronic systolic CHF (congestive heart failure) ([place])   . History of tobacco abuse   . Hyperlipidemia   . Hypertension   . Nonischemic cardiomyopathy ([place])    EF 10-15% on 11/2012 cath  . Sinus bradycardia 12/21/2012  . Ventricular tachycardia ([place]) 12/14/2012    Medications:  No current facility-administered medications on file prior to encounter.    Current Outpatient Medications on File Prior to Encounter  Medication Sig Dispense Refill  . apixaban (ELIQUIS) 5 MG TABS tablet Take 5 mg by mouth 2 (two) times daily.    Marland Kitchen atorvastatin (LIPITOR) 40 MG tablet Take 1 tablet (40 mg total) by mouth daily at 6 PM. 30 tablet 0  . carvedilol (COREG) 12.5 MG tablet Take 12.5 mg by mouth 2 (two) times daily with a meal.    . Cholecalciferol (VITAMIN D-3 PO) Take 1,000 Units by mouth at bedtime.     . furosemide (LASIX) 40 MG tablet Take 80 mg by mouth daily.     . pantoprazole (PROTONIX) 40 MG tablet Take 1 tablet (40 mg total) by mouth 2 (two) times daily before a meal. 120 tablet 0  . acetaminophen (TYLENOL) 325 MG tablet Take 2 tablets (650 mg total) by mouth every 6 (six) hours as needed for mild pain or  moderate pain. (Patient not taking: Reported on 06/07/2018) 60 tablet 0  . Diclofenac Sodium 3 % GEL Place 1 application onto the skin 2 (two) times daily. To affected area. (Patient not taking: Reported on 06/07/2018) 100 g 0  . HYDROcodone-acetaminophen (NORCO) 5-325 MG tablet Take 1-2 tablets by mouth every 6 (six) hours as needed. (Patient not taking: Reported on 06/07/2018) 12 tablet 0     Assessment: 72 y.o. female with chest pain, h/o AFib, for heparin.  On Eliquis PTA< but has been ~ 5 days since last dose.   Goal of Therapy:  Heparin level 0.3-0.7 units/ml Monitor platelets by anticoagulation protocol: Yes   Plan:  Heparin 2000 units IV bolus, then increase heparin  1100 units/hr Check heparin level in 8 hours.   Caryl Pina 06/07/2018,6:13 AM

## 2018-06-07 NOTE — Progress Notes (Signed)
CCMD called patient with vtach with heart rate sustaining in the 150-160s. Staff arrived in patient room to find her in no distress. HR remained sustained, MD called to bedside, EKG done shows abnormal rhythm, patient started to complained of nausea and CP, MD ordered IV metoprolol 5mg  and lidocain 50mg  was given. After that HR back to 60's SR. Patient transfer to [place] per MD order.

## 2018-06-07 NOTE — ED Provider Notes (Addendum)
[facility] EMERGENCY DEPARTMENT Provider Note   CSN: [id number] Arrival date & time: 06/07/18  0445     History   Chief Complaint Chief Complaint  Patient presents with  . Chest Pain    HPI Stephanie Yates is a 72 y.o. female.  HPI  72 year old female with history of advanced CHF with EF of 20%, A. Fib, prior history of V. tach status post AICD placement, CKD comes in with chief complaint of chest pain.  Patient reports that she woke up in the middle night with midsternal chest pain.  The pain is described as heaviness and it is nonradiating.  Patient's pain was 10 out of 10 and she called EMS.  Patient arrives to the ED with heart rate in the 150s.  Her chest pain has improved on its own.  According to EMS patient's blood pressure was in the 32K systolic for the entire duration.  Patient is supposed to be on Eliquis, however she has not taken Eliquis for the last 5 days because the pharmacy had run out of it.  Past Medical History:  Diagnosis Date  . Chronic renal insufficiency, stage III (moderate) (HCC)    Cr 2.0 5/15  . Chronic systolic CHF (congestive heart failure) ([place])   . History of tobacco abuse   . Hyperlipidemia   . Hypertension   . Nonischemic cardiomyopathy ([place])    EF 10-15% on 11/2012 cath  . Sinus bradycardia 12/21/2012  . Ventricular tachycardia ([place]) 12/14/2012    Patient Active Problem List   Diagnosis Date Noted  . Acute GI bleeding 06/15/2017  . Chronic anticoagulation   . Acute on chronic congestive heart failure ([place])   . HLD (hyperlipidemia) 03/05/2017  . CKD (chronic kidney disease), stage III ([place]) 03/05/2017  . Chest pain 03/05/2017  . Symptomatic anemia 03/05/2017  . PAF (paroxysmal atrial fibrillation) ([place]) 04/01/2013  . ICD (implantable cardioverter-defibrillator), dual, st Judes 04/01/2013  . PVC's (premature ventricular contractions) 04/01/2013  . Hypokalemia 12/21/2012  . History of tobacco abuse 12/21/2012    . Elevated troponin 12/21/2012  . Sinus bradycardia 12/21/2012  . Hyperkalemia 12/20/2012  . Nonischemic cardiomyopathy ([place]) 12/20/2012  . UTI (urinary tract infection) 12/20/2012  . Acute on chronic combined systolic and diastolic CHF (congestive heart failure) ([place]) 12/20/2012  . Ventricular tachycardia ([place]) 12/14/2012  . Hypertension 12/14/2012    Past Surgical History:  Procedure Laterality Date  . ESOPHAGOGASTRODUODENOSCOPY (EGD) WITH PROPOFOL N/A 06/18/2017   Procedure: ESOPHAGOGASTRODUODENOSCOPY (EGD) WITH PROPOFOL;  Surgeon: Ronnette Juniper, MD;  Location: [facility];  Service: Gastroenterology;  Laterality: N/A;  . IMPLANTABLE CARDIOVERTER DEFIBRILLATOR IMPLANT  12/19/2012   St. Jude dual-chamber ICD, serial number [id number]  . IMPLANTABLE CARDIOVERTER DEFIBRILLATOR IMPLANT N/A 12/19/2012   Procedure: IMPLANTABLE CARDIOVERTER DEFIBRILLATOR IMPLANT;  Surgeon: Deboraha Sprang, MD;  Location: [facility] CATH LAB;  Service: Cardiovascular;  Laterality: N/A;  . LEFT AND RIGHT HEART CATHETERIZATION WITH CORONARY ANGIOGRAM N/A 12/17/2012   Procedure: LEFT AND RIGHT HEART CATHETERIZATION WITH CORONARY ANGIOGRAM;  Surgeon: Sherren Mocha, MD;  Location: [facility] CATH LAB;  Service: Cardiovascular;  Laterality: N/A;     OB History   No obstetric history on file.      Home Medications    Prior to Admission medications   Medication Sig Start Date End Date Taking? Authorizing Provider  apixaban (ELIQUIS) 5 MG TABS tablet Take 5 mg by mouth 2 (two) times daily.   Yes [provider]  atorvastatin (LIPITOR) 40 MG tablet Take 1  tablet (40 mg total) by mouth daily at 6 PM. 03/13/17  Yes Hongalgi, Lenis Dickinson, MD  carvedilol (COREG) 12.5 MG tablet Take 12.5 mg by mouth 2 (two) times daily with a meal.   Yes [provider]  Cholecalciferol (VITAMIN D-3 PO) Take 1,000 Units by mouth at bedtime.    Yes [provider]  furosemide (LASIX) 40 MG tablet Take 80 mg by mouth daily.    Yes  [provider]  pantoprazole (PROTONIX) 40 MG tablet Take 1 tablet (40 mg total) by mouth 2 (two) times daily before a meal. 06/18/17  Yes Lavina Hamman, MD  acetaminophen (TYLENOL) 325 MG tablet Take 2 tablets (650 mg total) by mouth every 6 (six) hours as needed for mild pain or moderate pain. Patient not taking: Reported on 06/07/2018 07/18/17   Waynetta Pean, PA-C  Diclofenac Sodium 3 % GEL Place 1 application onto the skin 2 (two) times daily. To affected area. Patient not taking: Reported on 06/07/2018 07/18/17   Waynetta Pean, PA-C  HYDROcodone-acetaminophen (NORCO) 5-325 MG tablet Take 1-2 tablets by mouth every 6 (six) hours as needed. Patient not taking: Reported on 06/07/2018 07/22/17   Veryl Speak, MD    Family History Family History  Problem Relation Age of Onset  . Cancer Mother     Social History Social History   Tobacco Use  . Smoking status: Former Research scientist (life sciences)  . Smokeless tobacco: Never Used  Substance Use Topics  . Alcohol use: No  . Drug use: No     Allergies   Strawberry extract   Review of Systems Review of Systems  Constitutional: Positive for activity change.  Respiratory: Positive for chest tightness. Negative for shortness of breath.   Cardiovascular: Positive for chest pain. Negative for palpitations.  Gastrointestinal: Negative for nausea and vomiting.  Allergic/Immunologic: Negative for immunocompromised state.  Neurological: Negative for dizziness, syncope and light-headedness.  Hematological: Bruises/bleeds easily.  All other systems reviewed and are negative.    Physical Exam Updated Vital Signs BP 108/68   Pulse 64   Temp 97.6 F (36.4 C) (Oral)   Resp 19   Ht 5\' 6"  (1.676 m)   Wt 68 kg   SpO2 98%   BMI 24.21 kg/m   Physical Exam Vitals signs and nursing note reviewed.  Constitutional:      Appearance: She is well-developed.  HENT:     Head: Normocephalic and atraumatic.  Neck:     Musculoskeletal: Normal  range of motion and neck supple.  Cardiovascular:     Rate and Rhythm: Tachycardia present. Rhythm irregular.     Heart sounds: Normal heart sounds.  Pulmonary:     Effort: Pulmonary effort is normal.  Abdominal:     General: Bowel sounds are normal.  Musculoskeletal:     Right lower leg: No edema.     Left lower leg: No edema.  Skin:    General: Skin is warm and dry.  Neurological:     Mental Status: She is alert and oriented to person, place, and time.      ED Treatments / Results  Labs (all labs ordered are listed, but only abnormal results are displayed) Labs Reviewed  BASIC METABOLIC PANEL - Abnormal; Notable for the following components:      Result Value   Glucose, Bld 140 (*)    BUN 29 (*)    Creatinine, Ser 1.91 (*)    GFR calc non Af Amer 25 (*)    GFR calc  Af Amer 29 (*)    All other components within normal limits  CBC - Abnormal; Notable for the following components:   Hemoglobin 9.6 (*)    HCT 31.8 (*)    MCV 74.1 (*)    MCH 22.4 (*)    RDW 17.2 (*)    All other components within normal limits  TROPONIN I - Abnormal; Notable for the following components:   Troponin I 0.10 (*)    All other components within normal limits  BRAIN NATRIURETIC PEPTIDE - Abnormal; Notable for the following components:   B Natriuretic Peptide 1,111.3 (*)    All other components within normal limits  APTT - Abnormal; Notable for the following components:   aPTT 41 (*)    All other components within normal limits  MAGNESIUM  HEPARIN LEVEL (UNFRACTIONATED)  HEPARIN LEVEL (UNFRACTIONATED)  APTT    EKG EKG Interpretation  Date/Time:  Thursday June 07 2018 04:48:09 EST Ventricular Rate:  149 PR Interval:    QRS Duration: 151 QT Interval:  392 QTC Calculation: 618 R Axis:   145 Text Interpretation:  wide complex tachycardia new changes Nonspecific ST and T wave abnormality Confirmed by Varney Biles ([id number]) on 06/07/2018 6:04:12 AM   Radiology Dg Chest Portable  1 View  Result Date: 06/07/2018 CLINICAL DATA:  72 y/o  F; chest pain and shortness of breath. EXAM: PORTABLE CHEST 1 VIEW COMPARISON:  07/21/2017 chest radiograph FINDINGS: Stable cardiomegaly given projection and technique. Two lead AICD noted. Aortic atherosclerosis with calcification. Diffuse reticular opacities of the lungs. No consolidation, effusion, or pneumothorax. No acute osseous abnormality is evident. IMPRESSION: Stable cardiomegaly.  Interstitial pulmonary edema. Electronically Signed   By: Kristine Garbe M.D.   On: 06/07/2018 06:07    Procedures .Critical Care Performed by: Varney Biles, MD Authorized by: Varney Biles, MD   Critical care provider statement:    Critical care time (minutes):  45   Critical care start time:  06/07/2018 5:37 AM   Critical care end time:  06/07/2018 7:37 AM   Critical care time was exclusive of:  Separately billable procedures and treating other patients   Critical care was necessary to treat or prevent imminent or life-threatening deterioration of the following conditions:  Cardiac failure and circulatory failure   Critical care was time spent personally by me on the following activities:  Discussions with consultants, evaluation of patient's response to treatment, examination of patient, ordering and performing treatments and interventions, ordering and review of laboratory studies, ordering and review of radiographic studies, pulse oximetry, re-evaluation of patient's condition, obtaining history from patient or surrogate and review of old charts   (including critical care time)  Medications Ordered in ED Medications  heparin ADULT infusion 100 units/mL (25000 units/226mL sodium chloride 0.45%) (1,100 Units/hr Intravenous New Bag/Given 06/07/18 0654)  furosemide (LASIX) injection 40 mg (40 mg Intravenous Given 06/07/18 0639)  heparin bolus via infusion 2,000 Units (2,000 Units Intravenous Bolus from Bag 06/07/18 0654)      Initial Impression / Assessment and Plan / ED Course  I have reviewed the triage vital signs and the nursing notes.  Pertinent labs & imaging results that were available during my care of the patient were reviewed by me and considered in my medical decision making (see chart for details).  Clinical Course as of Jun 07 737  Thu Jun 07, 2018  6045 Patient continues to be in sinus rhythm.  Interrogation from her [place] is pending.   MAP (mmHg): 77 [  AN]  [id number] Troponin is elevated at 0.1, BNP is elevated over 1000. X-ray shows interstitial edema.  Likely because of her A. fib with RVR. We will give her Lasix, but I expect that she will improve secondary to optimal atrial kick has been restored.  We will also start patient on heparin drip.  Brain natriuretic peptide (IF shortness of breath has been documented this visit)(!) [AN]  0737 Patient is comfortable.  She denies any chest pain.  Stable for admission   [AN]    Clinical Course User Index [AN] Varney Biles, MD    Patient comes in with chief complaint of chest discomfort. Patient has history of nonischemic cardiomyopathy, A. fib, V. tach status post AICD placement and CKD.  She arrives to the ER with wide-complex tachycardia -likely A. fib with RVR with aberrancy.  She has an AICD in place and it has not fired.  Her blood pressure is noted to be in the 74J systolic -however patient is perfusing her brain well.  Her chest discomfort is actually now improved dramatically compared to when she first called EMS.   Cardiac pads were placed on the patient and initial lab assessment was started to ensure there is no electrolyte abnormality that was causing the symptoms.  Our intention was to cardiovert patient, however while awaiting labs patient's heart rate spontaneously improved.  History is not suggestive of any underlying infection or medication noncompliance as the cause for her RVR.  We will start her on heparin  drip.  This patients CHA2DS2-VASc Score and unadjusted Ischemic Stroke Rate (% per year) is equal to 3.2 % stroke rate/year from a score of 3  Above score calculated as 1 point each if present [CHF, HTN, DM, Vascular=MI/PAD/Aortic Plaque, Age if 65-74, or Female] Above score calculated as 2 points each if present [Age > 75, or Stroke/TIA/TE]    Final Clinical Impressions(s) / ED Diagnoses   Final diagnoses:  Atrial fibrillation with RVR ([place])  Acute congestive heart failure, unspecified heart failure type [facility])    ED Discharge Orders         Ordered    Amb referral to [facility]     06/07/18 [place], [place], MD 06/07/18 [place], [place], MD 06/07/18 [phone]

## 2018-06-07 NOTE — H&P (Signed)
History and Physical    Stephanie Yates ZOX:[id number] DOB: 08-04-1945 DOA: 06/07/2018  PCP: Josetta Huddle, MD  Patient coming from: Home via EMS.  I have personally briefly reviewed patient's old medical records in [place]  Chief Complaint: Chest pain.  HPI: Stephanie Yates is a 72 y.o. female with medical history significant of chronic renal insufficiency, chronic combined systolic and diastolic heart failure, history of tobacco use/former smoker, hyperlipidemia, hypertension, history of PAF, history of V. tach, history of sinus bradycardia, history of pacemaker placement who was brought to the emergency department via EMS after waking up with non-radiated, sharp chest pain associated with dyspnea, nausea, emesis, palpitations and mild lightheadedness at around 0230.  She is states taking a deep breath to relieved her chest pain.  She diaphoresis, orthopnea, recent pitting edema of the lower extremities.  Denies fever, chills, sore throat, rhinorrhea, wheezing, hemoptysis or productive cough.  No abdominal pain, diarrhea, constipation, melena or hematochezia.  Denies dysuria, frequency or hematuria.  Denies polyuria, polydipsia, polyphagia or blurry vision.  Denies skin rashes or pruritus.  ED Course: Upon arrival to the emergency department the patient was having atrial fibrillation with RVR in the 140s and 150s and aberrancy.  Her initial temperature was 97.6 F, blood pressure was 81/64 mmHg, respirations 24 and O2 sat 99% on room air.  The initial intent by Dr. Kathrynn Humble was to cardiovert the patient, but while waiting for some of her initial work-up patient's heart rate has been spontaneously improved and converted back to sinus rhythm.  She was started on a heparin infusion.  AICD was interrogated and did not show any abnormality.  Dr. Kathrynn Humble briefly discussed the case with cardiology, which did not suggest any further intervention at the moment.  Her lab work shows a white  count of 5.8, hemoglobin 9.6 g/dL and platelets 215.  BMP was 1111.3 pg/mL and troponin 0 0.10 ng/mL.  Initial EKG showed wide complex tachycardia.  Follow-up EKG was normal sinus rhythm with non-specific T wave abnormalities and borderline prolonged QT.  Chemistry shows normal electrolytes.  BUN was 29, creatinine 1.91 and glucose 140 mg/dL.  Her chest radiograph show interstitial edema.  Review of Systems: As per HPI otherwise 10 point review of systems negative.   Past Medical History:  Diagnosis Date  . Chronic renal insufficiency, stage III (moderate) (HCC)    Cr 2.0 5/15  . Chronic systolic CHF (congestive heart failure) ([place])   . History of tobacco abuse   . Hyperlipidemia   . Hypertension   . Nonischemic cardiomyopathy ([place])    EF 10-15% on 11/2012 cath  . Sinus bradycardia 12/21/2012  . Ventricular tachycardia ([place]) 12/14/2012    Past Surgical History:  Procedure Laterality Date  . ESOPHAGOGASTRODUODENOSCOPY (EGD) WITH PROPOFOL N/A 06/18/2017   Procedure: ESOPHAGOGASTRODUODENOSCOPY (EGD) WITH PROPOFOL;  Surgeon: Ronnette Juniper, MD;  Location: [facility];  Service: Gastroenterology;  Laterality: N/A;  . IMPLANTABLE CARDIOVERTER DEFIBRILLATOR IMPLANT  12/19/2012   St. Jude dual-chamber ICD, serial number [id number]  . IMPLANTABLE CARDIOVERTER DEFIBRILLATOR IMPLANT N/A 12/19/2012   Procedure: IMPLANTABLE CARDIOVERTER DEFIBRILLATOR IMPLANT;  Surgeon: Deboraha Sprang, MD;  Location: [facility] CATH LAB;  Service: Cardiovascular;  Laterality: N/A;  . LEFT AND RIGHT HEART CATHETERIZATION WITH CORONARY ANGIOGRAM N/A 12/17/2012   Procedure: LEFT AND RIGHT HEART CATHETERIZATION WITH CORONARY ANGIOGRAM;  Surgeon: Sherren Mocha, MD;  Location: [facility] CATH LAB;  Service: Cardiovascular;  Laterality: N/A;     reports that she has quit smoking. She  has never used smokeless tobacco. She reports that she does not drink alcohol or use drugs.  Allergies  Allergen Reactions  . Strawberry Extract Hives, Itching,  Swelling and Other (See Comments)    Hives and itching     Family History  Problem Relation Age of Onset  . Cancer Mother    Prior to Admission medications   Medication Sig Start Date End Date Taking? Authorizing Provider  apixaban (ELIQUIS) 5 MG TABS tablet Take 5 mg by mouth 2 (two) times daily.   Yes [provider]  atorvastatin (LIPITOR) 40 MG tablet Take 1 tablet (40 mg total) by mouth daily at 6 PM. 03/13/17  Yes Hongalgi, Lenis Dickinson, MD  carvedilol (COREG) 12.5 MG tablet Take 12.5 mg by mouth 2 (two) times daily with a meal.   Yes [provider]  Cholecalciferol (VITAMIN D-3 PO) Take 1,000 Units by mouth at bedtime.    Yes [provider]  furosemide (LASIX) 40 MG tablet Take 80 mg by mouth daily.    Yes [provider]  pantoprazole (PROTONIX) 40 MG tablet Take 1 tablet (40 mg total) by mouth 2 (two) times daily before a meal. 06/18/17  Yes Lavina Hamman, MD  acetaminophen (TYLENOL) 325 MG tablet Take 2 tablets (650 mg total) by mouth every 6 (six) hours as needed for mild pain or moderate pain. Patient not taking: Reported on 06/07/2018 07/18/17   Waynetta Pean, PA-C  Diclofenac Sodium 3 % GEL Place 1 application onto the skin 2 (two) times daily. To affected area. Patient not taking: Reported on 06/07/2018 07/18/17   Waynetta Pean, PA-C  HYDROcodone-acetaminophen (NORCO) 5-325 MG tablet Take 1-2 tablets by mouth every 6 (six) hours as needed. Patient not taking: Reported on 06/07/2018 07/22/17   Veryl Speak, MD    Physical Exam: Vitals:   06/07/18 0645 06/07/18 0700 06/07/18 0745 06/07/18 0800  BP: (!) 128/59 108/68 130/69 136/67  Pulse: 65 64 (!) 57 61  Resp: 13 19 19 14   Temp:      TempSrc:      SpO2: 97% 98% 97% 97%  Weight:      Height:        Constitutional: NAD, calm, comfortable Eyes: PERRL, lids and conjunctivae normal ENMT: Mucous membranes are moist. Posterior pharynx clear of any exudate or lesions. Neck: normal,  supple, no masses, no thyromegaly Respiratory: Mild bibasilar crackles, no wheezing. Normal respiratory effort. No accessory muscle use.  Cardiovascular: Regular rate and rhythm, no murmurs / rubs / gallops. No extremity edema. 2+ pedal pulses. No carotid bruits.  Abdomen: Soft, no tenderness, no masses palpated. No hepatosplenomegaly. Bowel sounds positive.  Musculoskeletal: no clubbing / cyanosis. Good ROM, no contractures. Normal muscle tone.  Skin: no rashes, lesions, ulcers on limited dermatological examination. Neurologic: CN 2-12 grossly intact. Sensation intact, DTR normal. Strength 5/5 in all 4.  Psychiatric: Normal judgment and insight. Alert and oriented x 4. Normal mood.   Labs on Admission: I have personally reviewed following labs and imaging studies  CBC: Recent Labs  Lab 06/07/18 0454  WBC 5.8  HGB 9.6*  HCT 31.8*  MCV 74.1*  PLT 542   Basic Metabolic Panel: Recent Labs  Lab 06/07/18 0454  NA 141  K 4.2  CL 108  CO2 22  GLUCOSE 140*  BUN 29*  CREATININE 1.91*  CALCIUM 8.9  MG 1.9   GFR: Estimated Creatinine Clearance: 24.2 mL/min (A) (by C-G formula based on SCr of 1.91 mg/dL (H)). Liver  Function Tests: No results for input(s): AST, ALT, ALKPHOS, BILITOT, PROT, ALBUMIN in the last 168 hours. No results for input(s): LIPASE, AMYLASE in the last 168 hours. No results for input(s): AMMONIA in the last 168 hours. Coagulation Profile: No results for input(s): INR, PROTIME in the last 168 hours. Cardiac Enzymes: Recent Labs  Lab 06/07/18 0454  TROPONINI 0.10*   BNP (last 3 results) No results for input(s): PROBNP in the last 8760 hours. HbA1C: No results for input(s): HGBA1C in the last 72 hours. CBG: No results for input(s): GLUCAP in the last 168 hours. Lipid Profile: No results for input(s): CHOL, HDL, LDLCALC, TRIG, CHOLHDL, LDLDIRECT in the last 72 hours. Thyroid Function Tests: No results for input(s): TSH, T4TOTAL, FREET4, T3FREE, THYROIDAB  in the last 72 hours. Anemia Panel: No results for input(s): VITAMINB12, FOLATE, FERRITIN, TIBC, IRON, RETICCTPCT in the last 72 hours. Urine analysis:    Component Value Date/Time   COLORURINE YELLOW 08/29/2014 2035   APPEARANCEUR CLOUDY (A) 08/29/2014 2035   LABSPEC 1.017 08/29/2014 2035   PHURINE 6.0 08/29/2014 2035   GLUCOSEU NEGATIVE 08/29/2014 2035   HGBUR SMALL (A) 08/29/2014 2035   BILIRUBINUR NEGATIVE 08/29/2014 2035   KETONESUR NEGATIVE 08/29/2014 2035   PROTEINUR NEGATIVE 08/29/2014 2035   UROBILINOGEN 1.0 08/29/2014 2035   NITRITE NEGATIVE 08/29/2014 2035   LEUKOCYTESUR TRACE (A) 08/29/2014 2035    Radiological Exams on Admission: Dg Chest Portable 1 View  Result Date: 06/07/2018 CLINICAL DATA:  72 y/o  F; chest pain and shortness of breath. EXAM: PORTABLE CHEST 1 VIEW COMPARISON:  07/21/2017 chest radiograph FINDINGS: Stable cardiomegaly given projection and technique. Two lead AICD noted. Aortic atherosclerosis with calcification. Diffuse reticular opacities of the lungs. No consolidation, effusion, or pneumothorax. No acute osseous abnormality is evident. IMPRESSION: Stable cardiomegaly.  Interstitial pulmonary edema. Electronically Signed   By: Kristine Garbe M.D.   On: 06/07/2018 06:07    EKG: Independently reviewed. EKG #1 Vent. rate 149 BPM PR interval * ms QRS duration 151 ms QT/QTc 392/618 ms P-R-T axes 0 145 91 wide complex tachycardia new changes Nonspecific ST and T wave abnormality  EKG #2 Vent. rate 66 BPM PR interval * ms QRS duration 138 ms QT/QTc 463/486 ms P-R-T axes 75 -31 88 Sinus rhythm Ventricular premature complex Prolonged PR interval Left ventricular hypertrophy Nonspecific T abnormalities, lateral leads Borderline prolonged QT interval  Assessment/Plan Principal Problem:   Acute on chronic combined systolic and diastolic heart failure (HCC) Secondary to ventricular tachycardia. Observation/telemetry. Continue  supplemental oxygen. Continue furosemide 40 mg IVP that he leave. Continue carvedilol 12.5 mg p.o. twice daily. Check echocardiogram.  Active Problems:   PAF (paroxysmal atrial fibrillation) (HCC) CHA2DS2-VASc Score of at least 4. Anticoagulation resumed. Continue beta-blocker.    Ventricular tachycardia (HCC) Continue carvedilol. Optimize magnesium level.    ICD (implantable cardioverter-defibrillator), dual, st Judes  No abnormality is found on interrogation.    Chest pain Continue heparin infusion. Continue carvedilol. Trend troponin level. Check echocardiogram.    Elevated troponin Likely due to demand ischemia. Continue cardiac monitoring. Trend troponin level.    Hypertension Continue carvedilol 12.5 mg p.o. twice daily. Also on furosemide. Monitor BP, HR, renal function and electrolytes.    HLD (hyperlipidemia) Continue atorvastatin 40 mg p.o. daily. Follow-up LFTs as needed.    CKD (chronic kidney disease), stage III (HCC)  Monitor electrolytes, BUN and creatinine.    Anemia Most recent anemia panels have shown low iron and low normal ferritin. Continue H&H monitoring.  May benefit from iron supplementation.   DVT prophylaxis: On heparin infusion. Code Status: Full code. Family Communication: Her daughters were present in the ED room. Disposition Plan: Observation for troponin level trending, CHF laceration treatment. Consults called: Admission status: Observation/telemetry.   Reubin Milan MD [facility] Pager [phone].  If 7PM-7AM, please contact night-coverage [email or web address] Password [email or web address]  06/07/2018, 8:50 AM

## 2018-06-07 NOTE — ED Triage Notes (Signed)
BIB [facility] from home with sharp central chest pain that woke pt up @ 0230. Denies radiation, nausea, vomiting. Received 324mg  aspirin in route.

## 2018-06-07 NOTE — Progress Notes (Signed)
  Echocardiogram 2D Echocardiogram has been performed.  Stephanie Yates 06/07/2018, 11:41 AM

## 2018-06-07 NOTE — Progress Notes (Addendum)
ANTICOAGULATION CONSULT NOTE - Initial Consult  Pharmacy Consult for apixaban Indication: atrial fibrillation  Allergies  Allergen Reactions  . Strawberry Extract Hives, Itching, Swelling and Other (See Comments)    Hives and itching     Patient Measurements: Height: 5\' 6"  (167.6 cm) Weight: 134 lb 3.2 oz (60.9 kg) IBW/kg (Calculated) : 59.3   Vital Signs: Temp: 97.6 F (36.4 C) (12/12 1713) Temp Source: Oral (12/12 1713) BP: 116/76 (12/12 1808) Pulse Rate: 61 (12/12 1808)  Labs: Recent Labs    06/07/18 0454 06/07/18 0455 06/07/18 0822 06/07/18 1336  HGB 9.6*  --   --   --   HCT 31.8*  --   --   --   PLT 215  --   --   --   APTT  --  41*  --  116*  HEPARINUNFRC  --  0.41  --  0.68  CREATININE 1.91*  --   --   --   TROPONINI 0.10*  --  0.15* 0.40*    Estimated Creatinine Clearance: 24.2 mL/min (A) (by C-G formula based on SCr of 1.91 mg/dL (H)).   Medical History: Past Medical History:  Diagnosis Date  . Chronic renal insufficiency, stage III (moderate) (HCC)    Cr 2.0 5/15  . Chronic systolic CHF (congestive heart failure) ([place])   . History of tobacco abuse   . Hyperlipidemia   . Hypertension   . Nonischemic cardiomyopathy ([place])    EF 10-15% on 11/2012 cath  . Sinus bradycardia 12/21/2012  . Ventricular tachycardia ([place]) 12/14/2012    Medications:  Medications Prior to Admission  Medication Sig Dispense Refill Last Dose  . apixaban (ELIQUIS) 5 MG TABS tablet Take 5 mg by mouth 2 (two) times daily.   06/01/2018 at 1800  . atorvastatin (LIPITOR) 40 MG tablet Take 1 tablet (40 mg total) by mouth daily at 6 PM. 30 tablet 0 06/06/2018 at Unknown time  . carvedilol (COREG) 12.5 MG tablet Take 12.5 mg by mouth 2 (two) times daily with a meal.   06/06/2018 at 1800  . Cholecalciferol (VITAMIN D-3 PO) Take 1,000 Units by mouth at bedtime.    06/06/2018 at Unknown time  . furosemide (LASIX) 40 MG tablet Take 80 mg by mouth daily.    06/06/2018 at Unknown time  .  pantoprazole (PROTONIX) 40 MG tablet Take 1 tablet (40 mg total) by mouth 2 (two) times daily before a meal. 120 tablet 0 06/06/2018 at Unknown time  . acetaminophen (TYLENOL) 325 MG tablet Take 2 tablets (650 mg total) by mouth every 6 (six) hours as needed for mild pain or moderate pain. (Patient not taking: Reported on 06/07/2018) 60 tablet 0 Not Taking at Unknown time  . Diclofenac Sodium 3 % GEL Place 1 application onto the skin 2 (two) times daily. To affected area. (Patient not taking: Reported on 06/07/2018) 100 g 0 Not Taking at Unknown time  . HYDROcodone-acetaminophen (NORCO) 5-325 MG tablet Take 1-2 tablets by mouth every 6 (six) hours as needed. (Patient not taking: Reported on 06/07/2018) 12 tablet 0 Completed Course at Unknown time   Scheduled:  . amiodarone  150 mg Intravenous Once  . atorvastatin  40 mg Oral q1800  . carvedilol  12.5 mg Oral BID WC  . cholecalciferol  1,000 Units Oral QHS  . furosemide  40 mg Intravenous BID  . pantoprazole  40 mg Oral BID AC    Assessment: 72 yo female here with CP (on apixaban5mg  po bid PTA  for afib). He was on heparin now to transition back to apixiban. -Hg= 9.6, plt= 215 -SCr= 1.95 (baseline variable but has been 1.9-2.1) -wt= 60.9 kg   Goal of Therapy:  Monitor platelets by anticoagulation protocol: Yes   Plan:   -Restart apixaban at 2.5mg  po bid (due to weight of ~60kg and SCr > 1.5) -Will follow patient progress  Hildred Laser, PharmD Clinical Pharmacist **Pharmacist phone directory can now be found on [email or web address] ([email or web address]).  Listed under [place].

## 2018-06-07 NOTE — Significant Event (Signed)
Rapid Response Event Note RN called pt in [place], asymptomatic  Overview: Time Called: 1625 Arrival Time: 1628 Event Type: Cardiac  Initial Focused Assessment: On arrival pt sitting up in bed, alert and oriented x4, epigastric pain 5/10, skin warm and dry. Pt converted to 58-60 NSR post treatment. Dr. Olevia Bowens at bedside, Cardiology paged  Interventions: EKG 5mg  Metoprolol IVP 50mg  Lidocaine IVP Transfer 6e01  Event Summary: Name of Physician Notified: Dr. Olevia Bowens  at (PTA RRT )    at    Outcome: Transferred (Comment)(6e01)  Event End Time: [place], [place]

## 2018-06-07 NOTE — Consult Note (Addendum)
Cardiology Consult    Patient ID: [name] MRN: [id number], DOB/AGE: 01-06-1946   Admit date: 06/07/2018 Date of Consult: 06/07/2018  Primary Physician: Josetta Huddle, MD Primary Cardiologist: Dr. Caryl Comes  Patient Profile    Stephanie Yates is a 72 y.o. female with a history of chronic combined systolic and diastolic CHF and nonischemic cardiomyopathy with EF of 20-25%, parosxysmal atrial fibrillation on Eliquis, tachy-brady syndrome s/p AICD, hypertension, hyperlipidemia, and CKD stage III, who is being seen today for the evaluation of chest pain at the request of Dr. Olevia Bowens.  History of Present Illness    Stephanie Yates is a 72 year old female with the above history who is followed by Dr. Caryl Comes. Patient intermittently with Amiodarone with it being discontinued perhaps by PCP. Patient last saw Dr. Caryl Comes on 02/20/2018 at which time she was doing well. Patient states she has been in her usual state of health recently until this morning when she developed sudden onset of chest pain.  Patient reports she woke up this morning around 2:30am with substernal non-radiating chest "heaviness" that she ranks as a 10/10 on the pain scale. Patient states it felt like something was sitting on her chest. She had associated diaphoresis and nausea but denies any shortness of breath, palpitations, lightheadedness, or dizziness. Patient denies any other recent chest pain but does state she has been more short of breath, especially with exertion, over the last week since running out of her Eliquis. She also reports some orthopnea and PND but denies any abnormal swelling or weight gain. Patient states pain persisted until she got to the ED.   Upon arrival to the ED, patient tachycardic with heart rate in the 150's and hypotensive with sytolic BP in the 25'K. EKG showed wide-complex tachycardia.. Troponin elevated at 0.10 >> 0.15 >> 0.40. Chest x-ray showed stable cardiomegaly with interstitial pulmonary edema.  BNP elevated at 1,111.3. WBC 5.8, Hgb 9.6, Plts 215. Na 141, K 4.2, Glucose 140, SCr 1.91. The initial plan was to cardiovert patient but patient spontaneously converted back to sinus rhythm. AICD was interrogated and did not show any significant arrhythmias. Patient admitted for further management.   Past Medical History   Past Medical History:  Diagnosis Date  . Chronic renal insufficiency, stage III (moderate) (HCC)    Cr 2.0 5/15  . Chronic systolic CHF (congestive heart failure) ([place])   . History of tobacco abuse   . Hyperlipidemia   . Hypertension   . Nonischemic cardiomyopathy ([place])    EF 10-15% on 11/2012 cath  . Sinus bradycardia 12/21/2012  . Ventricular tachycardia ([place]) 12/14/2012    Past Surgical History:  Procedure Laterality Date  . ESOPHAGOGASTRODUODENOSCOPY (EGD) WITH PROPOFOL N/A 06/18/2017   Procedure: ESOPHAGOGASTRODUODENOSCOPY (EGD) WITH PROPOFOL;  Surgeon: Ronnette Juniper, MD;  Location: [facility];  Service: Gastroenterology;  Laterality: N/A;  . IMPLANTABLE CARDIOVERTER DEFIBRILLATOR IMPLANT  12/19/2012   St. Jude dual-chamber ICD, serial number [id number]  . IMPLANTABLE CARDIOVERTER DEFIBRILLATOR IMPLANT N/A 12/19/2012   Procedure: IMPLANTABLE CARDIOVERTER DEFIBRILLATOR IMPLANT;  Surgeon: Deboraha Sprang, MD;  [facility];  Service: Cardiovascular;  Laterality: N/A;  . LEFT AND RIGHT HEART CATHETERIZATION WITH CORONARY ANGIOGRAM N/A 12/17/2012   Procedure: LEFT AND RIGHT HEART CATHETERIZATION WITH CORONARY ANGIOGRAM;  Surgeon: Sherren Mocha, MD;  Location: [facility] CATH LAB;  Service: Cardiovascular;  Laterality: N/A;     Allergies  Allergies  Allergen Reactions  . Strawberry Extract Hives, Itching, Swelling and Other (See Comments)  Hives and itching     Inpatient Medications    . atorvastatin  40 mg Oral q1800  . carvedilol  12.5 mg Oral BID WC  . cholecalciferol  1,000 Units Oral QHS  . [START ON 06/08/2018] furosemide  40 mg Intravenous Daily  .  metoprolol tartrate  5 mg Intravenous Once  . pantoprazole  40 mg Oral BID AC    Family History    Family History  Problem Relation Age of Onset  . Cancer Mother    She indicated that her mother is deceased. She indicated that her father is deceased.   Social History    Social History   Socioeconomic History  . Marital status: Legally Separated    Spouse name: Not on file  . Number of children: Not on file  . Years of education: Not on file  . Highest education level: Not on file  Occupational History  . Not on file  Social Needs  . Financial resource strain: Not on file  . Food insecurity:    Worry: Not on file    Inability: Not on file  . Transportation needs:    Medical: Not on file    Non-medical: Not on file  Tobacco Use  . Smoking status: Former Research scientist (life sciences)  . Smokeless tobacco: Never Used  Substance and Sexual Activity  . Alcohol use: No  . Drug use: No  . Sexual activity: Not on file  Lifestyle  . Physical activity:    Days per week: Not on file    Minutes per session: Not on file  . Stress: Not on file  Relationships  . Social connections:    Talks on phone: Not on file    Gets together: Not on file    Attends religious service: Not on file    Active member of club or organization: Not on file    Attends meetings of clubs or organizations: Not on file    Relationship status: Not on file  . Intimate partner violence:    Fear of current or ex partner: Not on file    Emotionally abused: Not on file    Physically abused: Not on file    Forced sexual activity: Not on file  Other Topics Concern  . Not on file  Social History Narrative  . Not on file     Review of Systems    Review of Systems  Constitutional: Positive for diaphoresis. Negative for chills and fever.  HENT: Negative for congestion.   Respiratory: Positive for cough and shortness of breath. Negative for sputum production.   Cardiovascular: Positive for chest pain, orthopnea and PND.  Negative for palpitations and leg swelling.  Gastrointestinal: Positive for nausea. Negative for abdominal pain, blood in stool and vomiting.  Genitourinary: Negative for hematuria.  Musculoskeletal: Negative for myalgias.  Neurological: Negative for dizziness, tingling and loss of consciousness.  Endo/Heme/Allergies: Does not bruise/bleed easily.  Psychiatric/Behavioral: Negative for substance abuse.    Physical Exam    Blood pressure (!) 87/72, pulse (!) 150, temperature 98.2 F (36.8 C), temperature source Oral, resp. rate 18, height 5\' 6"  (1.676 m), weight 60.9 kg, SpO2 98 %.  General: 72 y.o. female resting comfortably in no acute distress. Pleasant and cooperative. HEENT: Normal  Neck: Supple. No carotid bruits or JVD appreciated. Lungs: No increased work of breathing. Normal respiratory rate. Crackles noted in bilateral bases. Heart: RRR. Distinct S1 and S2. No murmurs, gallops, or rubs.  Abdomen: Soft, non-distended, and non-tender to palpation.  Bowel sounds present. Extremities: No significant lower extremity edema. Radial pulses and distal pedal pulses 2+ and equal bilaterally. Neuro: Alert and oriented x3. No focal deficits. Moves all extremities spontaneously. Psych: Normal affect.  Labs    Troponin (Point of Care Test) No results for input(s): TROPIPOC in the last 72 hours. Recent Labs    06/07/18 0454 06/07/18 0822 06/07/18 1336  TROPONINI 0.10* 0.15* 0.40*   Lab Results  Component Value Date   WBC 5.8 06/07/2018   HGB 9.6 (L) 06/07/2018   HCT 31.8 (L) 06/07/2018   MCV 74.1 (L) 06/07/2018   PLT 215 06/07/2018    Recent Labs  Lab 06/07/18 0454  NA 141  K 4.2  CL 108  CO2 22  BUN 29*  CREATININE 1.91*  CALCIUM 8.9  GLUCOSE 140*   Lab Results  Component Value Date   CHOL 132 03/05/2017   HDL 16 (L) 03/05/2017   LDLCALC 101 (H) 03/05/2017   TRIG 74 03/05/2017   No results found for: [facility]   Radiology Studies    Dg Chest Portable 1  View  Result Date: 06/07/2018 CLINICAL DATA:  72 y/o  F; chest pain and shortness of breath. EXAM: PORTABLE CHEST 1 VIEW COMPARISON:  07/21/2017 chest radiograph FINDINGS: Stable cardiomegaly given projection and technique. Two lead AICD noted. Aortic atherosclerosis with calcification. Diffuse reticular opacities of the lungs. No consolidation, effusion, or pneumothorax. No acute osseous abnormality is evident. IMPRESSION: Stable cardiomegaly.  Interstitial pulmonary edema. Electronically Signed   By: Kristine Garbe M.D.   On: 06/07/2018 06:07    EKG     EKG: All the below EKGs were personally reviewed: - Initial EKG in the ED at 4:48 showed wide complex tachycardia with rate of 149 bpm. - EKG at 5:22 showed sinus rhythm, rate 66 bpm, with non-specific T wave changes in lateral leads.  - EKG at 16:42 showed wide complex tachycardia with rate of 149 bpm. - EKG at 16:49 showed sinus rhythm, rate 66 bpm, with 1st degree AV block and non-specific T wave changes in lateral leads.   Telemetry: Telemetry was personally reviewed and demonstrates: sinus rhythm with occasional PVC  Cardiac Imaging    Echocardiogram 06/07/2018: Study Conclusions: - Left ventricle: The cavity size was severely dilated. There was   moderate concentric hypertrophy. Systolic function was severely   reduced. The estimated ejection fraction was in the range of 20%   to 25%. Severe global hypokinesis with inferior, apical and   lateral akinesis. The study is not technically sufficient to   allow evaluation of LV diastolic function. - Mitral valve: There was moderate to severe posteriorly directed   regurgitation. Valve area by continuity equation (using LVOT   flow): 2.01 cm^2. - Left atrium: Severely dilated. - Right ventricle: The cavity size was mildly dilated. Moderately   reduced systolic function. - Tricuspid valve: There was moderate regurgitation. - Pulmonary arteries: PA peak pressure: 41 mm Hg  (S). - Systemic veins: The IVC measures <2.1 cm, but does not collapse   >50%, suggesting an elevated RA pressure of 8 mmHg. - Pericardium, extracardiac: A trivial pericardial effusion was   identified posterior to the heart.  Impressions: - Compared to a prior study in 2018, the LVEF is similar around 25%   with inferior, apical and lateral akinesis and global   hypokinesis, severe LAE, moderate to severe MR, moderate TR and   an RVSP of 41 mmHg. A trivial posterior pericardial effusion was   noted.  Assessment & Plan    Acute on Chronic Combines Systolic and Diastolic CHF/ Non-ischemic Cardiomyopathy - Echo showed LVEF of 20-25% inferior, apical and lateral akinesis and global hypokinesis, severe LAE, moderate to severe MR, moderate TR, and an RVSP of 41 mmHg. A trivial posterior pericardial effusion was also noted. - Chest x-ray showed stable cardiomegaly with interstitial pulmonary edema. - Bilateral crackles noted on exam but no peripheral edema or JVD.  - Continue diuresis with IV Lasix. - Continue Coreg 12.5mg  twice daily.  - Continue to monitor daily weights, strict I/O's, and renal function.  Chest Pain with Elevated Troponin - Patient presents with substernal non-radiating chest "heaviness" with associated diaphoresis and nausea. - Troponin elevated at 0.10, 0.15, 0.40. Possible demand ischemia in setting of acute CHF and ventricular tachycardia.   Ventricular Tachycardia  - Initial EKG showed wide-complex tachycardia likely ventricular tachycardia (vs. atrial fibrillation with RVR with aberrancy). - Per chart review, nursing received call from [place] around 4:20pm that patient was in in sustained ventricular tachycardia. Repeat EKG was ordered which showed wide-complex tachycardia with rate of 149 bpm. Patient reportedly received IV Lidocaine which very quickly broke the tachycardia. - Patient currently in normal sinus rhythm on telemetry.  - Potassium 4.2. Goal >4. Replete  as needed.  - Magnesium 1.9. Goal > 2.0. Replete as needed. - Continue Coreg as needed. - Consider adding Amiodarone. Will defer to MD.   Paroxysmal Atrial Fibrillation  - Patient currently in normal sinus rhythm on telemetry. - Continue Coreg as above.  - Continue chronic anticoagulation with Eliquis.  Hypertension  - Patient has had labile blood pressure with systolic BP ranging between the 80's and 140's. - Most recent BP 138/78. - Continue Coreg as above.   Hyperlipidemia - Continue home statin.   CKD Stage III - Scr 1.91 on admission, which appears to be around baseline.  - Continue to monitor renal function while being diuresed. - Per primary team.  Anemia - Per primary team.   Signed, Darreld Mclean, PA-C 06/07/2018, 4:45 PM  For questions or updates, please contact   Please consult [email or web address] for contact info under Cardiology/STEMI.  As above, patient seen and examined.  Briefly she is a 72 year old female with past medical history of nonischemic cardiomyopathy, chronic systolic congestive heart failure, paroxysmal atrial fibrillation, tachybradycardia syndrome status post ICD, hypertension, hyperlipidemia, chronic stage III kidney disease, ventricular tachycardia for evaluation of ventricular tachycardia and acute on chronic systolic congestive heart failure.  Patient did have a cardiac catheterization in 2014 showed no coronary disease.  Patient states that for 1 week leading into this admission she noticed worsening dyspnea on exertion and orthopnea.  She denies pedal edema, weight gain.  This morning she developed chest heaviness with no associated palpitations.  No syncope.  She therefore presented to the emergency room.  On presentation electrocardiogram showed wide complex tachycardia at a rate of 148 likely ventricular tachycardia.  Systolic blood pressure was 80.  She converted spontaneously to sinus rhythm. Laboratories show sodium 141, potassium 4.2,  magnesium 1.9, BNP 1111, troponin 0 0.40, hemoglobin 9.6, MCV 74. Echocardiogram showed severe LV dysfunction, moderate to severe mitral regurgitation and moderate tricuspid regurgitation. Presenting electrocardiogram shows probable ventricular tachycardia. FU Electrocardiogram personally reviewed and shows sinus rhythm with first-degree AV block, IVCD and poor R wave progression.  1 ventricular tachycardia-patient appears to have been in ventricular tachycardia at time of admission.  We will have ICD interrogated.  I will add IV amiodarone. Potassium and magnesium  stable.  Will ask electrophysiology to review tomorrow morning.  2 acute on chronic systolic congestive heart failure-patient presented with CHF symptoms as well possibly exacerbated by ventricular tachycardia.  Will diurese with Lasix 40 mg IV twice daily.  Follow renal function.  Patient needs low-sodium diet and fluid restriction.  3 nonischemic cardiomyopathy-continue beta-blocker.  She is not on an ARB or hydralazine/nitrates.  Follow blood pressure and renal function and add as tolerated.  4 prior ICD  5 chronic stage III kidney disease-follow renal function closely with diuresis.  6 elevated troponin-likely secondary to ventricular tachycardia and demand ischemia.  Previous catheterization revealed no coronary disease.  We will continue to cycle enzymes.  7 history of paroxysmal atrial fibrillation-patient remains in sinus rhythm.  Continue beta-blocker.  Resume apixaban.  8 mitral regurgitation-we will need follow-up echoes in the future.  Could consider mitral clip if she develops worsening congestive heart failure symptoms despite optimal medical therapy.  Kirk Ruths, MD

## 2018-06-08 ENCOUNTER — Encounter (HOSPITAL_COMMUNITY): Payer: Self-pay | Admitting: General Practice

## 2018-06-08 DIAGNOSIS — E876 Hypokalemia: Secondary | ICD-10-CM | POA: Diagnosis not present

## 2018-06-08 DIAGNOSIS — I480 Paroxysmal atrial fibrillation: Secondary | ICD-10-CM | POA: Diagnosis not present

## 2018-06-08 DIAGNOSIS — I081 Rheumatic disorders of both mitral and tricuspid valves: Secondary | ICD-10-CM | POA: Diagnosis not present

## 2018-06-08 DIAGNOSIS — R001 Bradycardia, unspecified: Secondary | ICD-10-CM | POA: Diagnosis not present

## 2018-06-08 DIAGNOSIS — R0789 Other chest pain: Secondary | ICD-10-CM

## 2018-06-08 DIAGNOSIS — I472 Ventricular tachycardia: Secondary | ICD-10-CM | POA: Diagnosis not present

## 2018-06-08 DIAGNOSIS — I10 Essential (primary) hypertension: Secondary | ICD-10-CM

## 2018-06-08 DIAGNOSIS — R5381 Other malaise: Secondary | ICD-10-CM | POA: Diagnosis present

## 2018-06-08 DIAGNOSIS — I959 Hypotension, unspecified: Secondary | ICD-10-CM | POA: Diagnosis not present

## 2018-06-08 DIAGNOSIS — N183 Chronic kidney disease, stage 3 (moderate): Secondary | ICD-10-CM | POA: Diagnosis not present

## 2018-06-08 DIAGNOSIS — Z91018 Allergy to other foods: Secondary | ICD-10-CM | POA: Diagnosis not present

## 2018-06-08 DIAGNOSIS — Z87891 Personal history of nicotine dependence: Secondary | ICD-10-CM | POA: Diagnosis not present

## 2018-06-08 DIAGNOSIS — R079 Chest pain, unspecified: Secondary | ICD-10-CM | POA: Diagnosis present

## 2018-06-08 DIAGNOSIS — Z95810 Presence of automatic (implantable) cardiac defibrillator: Secondary | ICD-10-CM

## 2018-06-08 DIAGNOSIS — I495 Sick sinus syndrome: Secondary | ICD-10-CM | POA: Diagnosis not present

## 2018-06-08 DIAGNOSIS — I428 Other cardiomyopathies: Secondary | ICD-10-CM | POA: Diagnosis not present

## 2018-06-08 DIAGNOSIS — I4891 Unspecified atrial fibrillation: Secondary | ICD-10-CM | POA: Diagnosis present

## 2018-06-08 DIAGNOSIS — I5043 Acute on chronic combined systolic (congestive) and diastolic (congestive) heart failure: Secondary | ICD-10-CM | POA: Diagnosis not present

## 2018-06-08 DIAGNOSIS — E785 Hyperlipidemia, unspecified: Secondary | ICD-10-CM

## 2018-06-08 DIAGNOSIS — D631 Anemia in chronic kidney disease: Secondary | ICD-10-CM | POA: Diagnosis not present

## 2018-06-08 DIAGNOSIS — I130 Hypertensive heart and chronic kidney disease with heart failure and stage 1 through stage 4 chronic kidney disease, or unspecified chronic kidney disease: Secondary | ICD-10-CM | POA: Diagnosis not present

## 2018-06-08 DIAGNOSIS — Z4502 Encounter for adjustment and management of automatic implantable cardiac defibrillator: Secondary | ICD-10-CM | POA: Diagnosis not present

## 2018-06-08 DIAGNOSIS — Z79899 Other long term (current) drug therapy: Secondary | ICD-10-CM | POA: Diagnosis not present

## 2018-06-08 DIAGNOSIS — I440 Atrioventricular block, first degree: Secondary | ICD-10-CM | POA: Diagnosis not present

## 2018-06-08 DIAGNOSIS — Z7901 Long term (current) use of anticoagulants: Secondary | ICD-10-CM | POA: Diagnosis not present

## 2018-06-08 LAB — BASIC METABOLIC PANEL
Anion gap: 12 (ref 5–15)
BUN: 23 mg/dL (ref 8–23)
CO2: 23 mmol/L (ref 22–32)
Calcium: 8.3 mg/dL — ABNORMAL LOW (ref 8.9–10.3)
Chloride: 105 mmol/L (ref 98–111)
Creatinine, Ser: 1.48 mg/dL — ABNORMAL HIGH (ref 0.44–1.00)
GFR calc Af Amer: 40 mL/min — ABNORMAL LOW (ref >60)
GFR calc non Af Amer: 35 mL/min — ABNORMAL LOW (ref >60)
Glucose, Bld: 103 mg/dL — ABNORMAL HIGH (ref 70–99)
Potassium: 3.2 mmol/L — ABNORMAL LOW (ref 3.5–5.1)
Sodium: 140 mmol/L (ref 135–145)

## 2018-06-08 LAB — CBC
HCT: 28.2 % — ABNORMAL LOW (ref 36.0–46.0)
Hemoglobin: 8.8 g/dL — ABNORMAL LOW (ref 12.0–15.0)
MCH: 22.4 pg — ABNORMAL LOW (ref 26.0–34.0)
MCHC: 31.2 g/dL (ref 30.0–36.0)
MCV: 71.8 fL — ABNORMAL LOW (ref 80.0–100.0)
Platelets: 183 10*3/uL (ref 150–400)
RBC: 3.93 MIL/uL (ref 3.87–5.11)
RDW: 16.9 % — ABNORMAL HIGH (ref 11.5–15.5)
WBC: 5.7 10*3/uL (ref 4.0–10.5)
nRBC: 0 % (ref 0.0–0.2)

## 2018-06-08 LAB — HEMOGLOBIN A1C
Hgb A1c MFr Bld: 5.4 % (ref 4.8–5.6)
Mean Plasma Glucose: 108.28 mg/dL

## 2018-06-08 MED ORDER — AMIODARONE HCL IN DEXTROSE 360-4.14 MG/200ML-% IV SOLN: 30.0000 mg/h | INTRAVENOUS | Status: AC

## 2018-06-08 MED ORDER — ENSURE ENLIVE PO LIQD
237.0000 mL | Freq: Two times a day (BID) | ORAL | Status: DC
  Administered 2018-06-09 – 2018-06-13 (×6): 237 mL via ORAL

## 2018-06-08 MED ORDER — POTASSIUM CHLORIDE CRYS ER 20 MEQ PO TBCR
40.0000 meq | EXTENDED_RELEASE_TABLET | Freq: Once | ORAL | Status: AC
  Administered 2018-06-08: 40 meq via ORAL
  Filled 2018-06-08: qty 2

## 2018-06-08 MED ORDER — AMIODARONE HCL 200 MG PO TABS
200.0000 mg | ORAL_TABLET | Freq: Two times a day (BID) | ORAL | Status: DC
  Administered 2018-06-08 – 2018-06-13 (×10): 200 mg via ORAL
  Filled 2018-06-08 (×10): qty 1

## 2018-06-08 MED ORDER — FUROSEMIDE 40 MG PO TABS
40.0000 mg | ORAL_TABLET | Freq: Every day | ORAL | Status: DC
  Administered 2018-06-09: 40 mg via ORAL
  Filled 2018-06-08: qty 1

## 2018-06-08 MED ORDER — APIXABAN 5 MG PO TABS
5.0000 mg | ORAL_TABLET | Freq: Two times a day (BID) | ORAL | Status: DC
  Administered 2018-06-08 – 2018-06-11 (×8): 5 mg via ORAL
  Filled 2018-06-08 (×8): qty 1

## 2018-06-08 NOTE — Discharge Instructions (Signed)

## 2018-06-08 NOTE — Plan of Care (Signed)
  Problem: Clinical Measurements: Goal: Will remain free from infection Outcome: Progressing Note:  No s/s of infection noted. Goal: Respiratory complications will improve Outcome: Progressing Note:  2L/[place] discontinued; now stable on room air.  No s/s of respiratory complications noted. Goal: Cardiovascular complication will be avoided Outcome: Progressing Note:  Maintaining sinus rhythm; amiodarone gtt infusing.  No s/s of cardiovascular complications noted.   Problem: Education: Goal: Knowledge of General Education information will improve Description Including pain rating scale, medication(s)/side effects and non-pharmacologic comfort measures Outcome: Completed/Met

## 2018-06-08 NOTE — Care Management Obs Status (Signed)
[place] NOTIFICATION   Patient Details  Name: Stephanie Yates MRN: [id number] Date of Birth: [birth year]   Medicare Observation Status Notification Given:  Yes    Bethena Roys, RN 06/08/2018, 11:18 AM

## 2018-06-08 NOTE — Progress Notes (Signed)
PROGRESS NOTE    GLORIE DOWLEN  QIO:[id number]  DOB: 09-22-1945  DOA: 06/07/2018 PCP: Josetta Huddle, MD  Brief Narrative:   72 y/o female with history of paroxysmal atrial fibrillation, tachybrady syndrome status post AICD, hypertension, hyperlipidemia, chronic kidney disease stage III, chronic combined systolic and diastolic CHF with EF of 20 to 25% who is on chronic diuretics/AV blockers/anticoagulation at baseline presented with chest pain associated with dyspnea, diaphoresis and nausea.  Upon arrival in the ED patient noted to be tachycardic with heart rate in 150s and associated hypotension with systolic blood pressure in 80s.  EKG showed wide-complex tachycardia.  Troponin peak 2.4.  Chest x-ray showed cardiomegaly and pulmonary edema with BNP greater than 1000.  Cardioversion planned in the ED initially by cardiology but patient spontaneously converted to sinus rhythm.  AICD interrogated in the ED with?  No significant arrhythmias  Subjective:   Patient feels much better this morning although still reports dyspnea.  No leg swellings.  She reports compliance with her medications.  Cardiac monitor shows sinus rhythm with heart rate in 50s.  Did not receive Coreg this morning due to holding parameters/heart rate.  Objective: Vitals:   06/08/18 0105 06/08/18 0524 06/08/18 0828 06/08/18 1121  BP: (!) 125/50 120/71 (!) 113/53 (!) 123/59  Pulse: (!) 50 (!) 50 (!) 50 (!) 50  Resp: 15  18 18   Temp: 98.3 F (36.8 C) 97.7 F (36.5 C) 98 F (36.7 C) 97.6 F (36.4 C)  TempSrc: Oral Oral Oral Oral  SpO2: 94% 91% 96% 96%  Weight:  61.1 kg    Height:        Intake/Output Summary (Last 24 hours) at 06/08/2018 1519 Last data filed at 06/08/2018 1300 Gross per 24 hour  Intake 868.64 ml  Output 2200 ml  Net -1331.36 ml   Filed Weights   06/07/18 0450 06/07/18 1413 06/08/18 0524  Weight: 68 kg 60.9 kg 61.1 kg    Physical Examination:  General exam: Appears calm and comfortable    Respiratory system: Clear to auscultation. Respiratory effort normal. Cardiovascular system: S1 & S2 heard, RRR. No JVD, murmurs, rubs, gallops or clicks. No pedal edema. Gastrointestinal system: Abdomen is nondistended, soft and nontender. No organomegaly or masses felt. Normal bowel sounds heard. Central nervous system: Alert and oriented. No focal neurological deficits. Extremities: Symmetric 5 x 5 power. Skin: No rashes, lesions or ulcers Psychiatry: Judgement and insight appear normal. Mood & affect appropriate.     Data Reviewed: I have personally reviewed following labs and imaging studies  CBC: Recent Labs  Lab 06/07/18 0454 06/08/18 0703  WBC 5.8 5.7  HGB 9.6* 8.8*  HCT 31.8* 28.2*  MCV 74.1* 71.8*  PLT 215 324   Basic Metabolic Panel: Recent Labs  Lab 06/07/18 0454 06/08/18 0703  NA 141 140  K 4.2 3.2*  CL 108 105  CO2 22 23  GLUCOSE 140* 103*  BUN 29* 23  CREATININE 1.91* 1.48*  CALCIUM 8.9 8.3*  MG 1.9  --    GFR: Estimated Creatinine Clearance: 31.2 mL/min (A) (by C-G formula based on SCr of 1.48 mg/dL (H)). Liver Function Tests: No results for input(s): AST, ALT, ALKPHOS, BILITOT, PROT, ALBUMIN in the last 168 hours. No results for input(s): LIPASE, AMYLASE in the last 168 hours. No results for input(s): AMMONIA in the last 168 hours. Coagulation Profile: No results for input(s): INR, PROTIME in the last 168 hours. Cardiac Enzymes: Recent Labs  Lab 06/07/18 0454 06/07/18 0822 06/07/18 1336  06/07/18 2013  TROPONINI 0.10* 0.15* 0.40* 0.72*   BNP (last 3 results) No results for input(s): PROBNP in the last 8760 hours. HbA1C: Recent Labs    06/08/18 0703  HGBA1C 5.4   CBG: No results for input(s): GLUCAP in the last 168 hours. Lipid Profile: No results for input(s): CHOL, HDL, LDLCALC, TRIG, CHOLHDL, LDLDIRECT in the last 72 hours. Thyroid Function Tests: No results for input(s): TSH, T4TOTAL, FREET4, T3FREE, THYROIDAB in the last 72  hours. Anemia Panel: No results for input(s): VITAMINB12, FOLATE, FERRITIN, TIBC, IRON, RETICCTPCT in the last 72 hours. Sepsis Labs: No results for input(s): PROCALCITON, LATICACIDVEN in the last 168 hours.  No results found for this or any previous visit (from the past 240 hour(s)).    Radiology Studies: Dg Chest Portable 1 View  Result Date: 06/07/2018 CLINICAL DATA:  72 y/o  F; chest pain and shortness of breath. EXAM: PORTABLE CHEST 1 VIEW COMPARISON:  07/21/2017 chest radiograph FINDINGS: Stable cardiomegaly given projection and technique. Two lead AICD noted. Aortic atherosclerosis with calcification. Diffuse reticular opacities of the lungs. No consolidation, effusion, or pneumothorax. No acute osseous abnormality is evident. IMPRESSION: Stable cardiomegaly.  Interstitial pulmonary edema. Electronically Signed   By: Kristine Garbe M.D.   On: 06/07/2018 06:07        Scheduled Meds: . apixaban  5 mg Oral BID  . atorvastatin  40 mg Oral q1800  . carvedilol  12.5 mg Oral BID WC  . cholecalciferol  1,000 Units Oral QHS  . [START ON 06/09/2018] furosemide  40 mg Oral Daily  . pantoprazole  40 mg Oral BID AC   Continuous Infusions: . sodium chloride 500 mL (06/07/18 1158)  . amiodarone 30 mg/hr (06/08/18 0630)    Assessment & Plan:    1.  Wide-complex tachycardia: Ventricular tachycardia versus A. fib with RVR/aberrancy.  Appreciate cardiology evaluation.  Patient received IV lidocaine in the ED and converted to sinus rhythm.  Cardiology started patient on amiodarone drip.  Continue anticoagulation and Coreg.  Per daughter patient due for AICD change in 2020.  2.  Acute on chronic combined systolic/diastolic CHF with EF 20 to 25%: Admission chest x-ray showed pulmonary edema with elevated BNP.  Continue IV Lasix and monitor daily weights/I's and O's  3.  Chronic kidney disease stage III: Baseline creatinine around 1.8-2.  Watch on diuretics.  Labs today showed  improvement to 1.4.  4.  Hyperlipidemia: Continue statins  5.  Hypertension: Patient was hypotensive in the setting of tachycardia upon presentation.  Currently heart rate and blood pressure improved.  Resume Coreg with holding parameters  4.  Hypo-kalemia: Replace with goal to be above 4 and magnesium greater than 2  6.  Chronic anemia: Patient's baseline hemoglobin around 9.5.  Labs today show hemoglobin at 8.8 likely dilutional component.  No signs of bleeding.  Continue anticoagulation and monitor.    DVT prophylaxis: On anticoagulation Code Status: Full code Family / Patient Communication: Discussed with patient and daughter bedside Disposition Plan: Home   Patient's admission status changed to inpatient as anticipated length of stay greater than 2 midnights  LOS: 0 days    Time spent: 35 minutes    Guilford Shi, MD [facility] Pager [phone]  If 7PM-7AM, please contact night-coverage [email or web address] Password [facility] 06/08/2018, 3:19 PM

## 2018-06-08 NOTE — Care Management Note (Signed)
Case Management Note  Patient Details  Name: Stephanie Yates MRN: [id number] Date of Birth: [birth year]  Subjective/Objective: Pt presented for CHF and Chest Pain. PTA Independent from home. Patient works in a [employer]. Family support of daughter.                 Action/Plan: No home needs identified at this time.   Expected Discharge Date:                  Expected Discharge Plan:  Home/Self Care  In-House Referral:  NA  Discharge planning Services  CM Consult  Post Acute Care Choice:  NA Choice offered to:  NA  DME Arranged:  N/A DME Agency:  NA  HH Arranged:  NA HH Agency:  NA  Status of Service:  Completed, signed off  If discussed at [place] of Stay Meetings, dates discussed:    Additional Comments:  Bethena Roys, RN 06/08/2018, 11:27 AM

## 2018-06-08 NOTE — Consult Note (Addendum)
Cardiology Consultation:   Patient ID: HARMONI Yates MRN: [id number]; DOB: August 20, 1945  Admit date: 06/07/2018 Date of Consult: 06/08/2018  Primary Care Provider: Josetta Huddle, MD Primary Cardiologist/Primary Electrophysiologist:  Dr. Caryl Comes   Patient Profile:   Stephanie Yates is a 72 y.o. female with a hx of NICM, chronic CHF (systolic, class IIB), HTN, HLD, CKD (III.IV), h/o VT  Historically on amiodarone, and AFib  who is being seen today for the evaluation of VT at the request of Dr. Stanford Breed.  History of Present Illness:   Ms. Stephanie Yates last saw Dr. Caryl Comes in August, at that time doing well, no arrhythmias, noting her amiodarone had been stopped, presumably by her PMD, though no mention as to why.  Planned for labs to f/u on her anemia, hypokalemia and TSH.  She woke early AM yesterday 2/2 CP, associated diaphoresis, nausea.  No unusuakl symptoms of late were reported, perhaps a bit more SOB with exertion only.  She mentioned having run out of her Eliquis.  The patient reports that she has been out of her Eliquis again for the last 5 days.  Upon arrival to the ED, patient tachycardic with heart rate in the 150's and hypotensive with sytolic BP in the 78'E. EKG showed wide-complex tachycardia.. Troponin elevated at 0.10 >> 0.15 >> 0.40. Chest x-ray showed stable cardiomegaly with interstitial pulmonary edema. BNP elevated at 1,111.3. WBC 5.8, Hgb 9.6, Plts 215. Na 141, K 4.2, Glucose 140, SCr 1.91 She was to be cardioverted though did spontaneously in the ER. Admitted with acute/chronic CHF started on IV lasix, elevated Trop not felt by cardiology consult to be likely demand, and VT started on amiodarone gtt.  Unclear when or by who the amiodarone was stopped by reports from the patient/daughter, they thought was Dr. Caryl Comes.  The patient feels much better, started feeling better down in the ER "after getting some medicines".   Device information: SJM dual chamber ICD, implanted  12/19/12, secondary prevention, Dr. Caryl Comes Chronically elevated RV threshold AAD tx: amiodarone (2014) > stopped  Device interrogation done today Battery and lead measurements are stable (known chronic elevated RV lead thresholds), on autocapture 1-AMS, AFlutter 10 seconds in Sept No VT/VF episodes  CorView looks at/just below threshold (OK) AP 8.9% VP 1.5% Brady pacing programmed for 50bpm  Past Medical History:  Diagnosis Date  . Chronic renal insufficiency, stage III (moderate) (HCC)    Cr 2.0 5/15  . Chronic systolic CHF (congestive heart failure) ([place])   . History of tobacco abuse   . Hyperlipidemia   . Hypertension   . Nonischemic cardiomyopathy ([place])    EF 10-15% on 11/2012 cath  . Sinus bradycardia 12/21/2012  . Ventricular tachycardia ([place]) 12/14/2012    Past Surgical History:  Procedure Laterality Date  . ESOPHAGOGASTRODUODENOSCOPY (EGD) WITH PROPOFOL N/A 06/18/2017   Procedure: ESOPHAGOGASTRODUODENOSCOPY (EGD) WITH PROPOFOL;  Surgeon: Ronnette Juniper, MD;  Location: [place];  Service: Gastroenterology;  Laterality: N/A;  . IMPLANTABLE CARDIOVERTER DEFIBRILLATOR IMPLANT  12/19/2012   St. Jude dual-chamber ICD, serial number [id number]  . IMPLANTABLE CARDIOVERTER DEFIBRILLATOR IMPLANT N/A 12/19/2012   Procedure: IMPLANTABLE CARDIOVERTER DEFIBRILLATOR IMPLANT;  Surgeon: Deboraha Sprang, MD;  Location[facility];  Service: Cardiovascular;  Laterality: N/A;  . LEFT AND RIGHT HEART CATHETERIZATION WITH CORONARY ANGIOGRAM N/A 12/17/2012   Procedure: LEFT AND RIGHT HEART CATHETERIZATION WITH CORONARY ANGIOGRAM;  Surgeon: Sherren Mocha, MD;  Location: [facility] CATH LAB;  Service: Cardiovascular;  Laterality: N/A;     Home Medications:  Prior to Admission medications   Medication Sig Start Date End Date Taking? Authorizing Provider  apixaban (ELIQUIS) 5 MG TABS tablet Take 5 mg by mouth 2 (two) times daily.   Yes [provider]  atorvastatin (LIPITOR) 40 MG tablet Take 1  tablet (40 mg total) by mouth daily at 6 PM. 03/13/17  Yes Hongalgi, Lenis Dickinson, MD  carvedilol (COREG) 12.5 MG tablet Take 12.5 mg by mouth 2 (two) times daily with a meal.   Yes [provider]  Cholecalciferol (VITAMIN D-3 PO) Take 1,000 Units by mouth at bedtime.    Yes [provider]  furosemide (LASIX) 40 MG tablet Take 80 mg by mouth daily.    Yes [provider]  pantoprazole (PROTONIX) 40 MG tablet Take 1 tablet (40 mg total) by mouth 2 (two) times daily before a meal. 06/18/17  Yes Lavina Hamman, MD  acetaminophen (TYLENOL) 325 MG tablet Take 2 tablets (650 mg total) by mouth every 6 (six) hours as needed for mild pain or moderate pain. Patient not taking: Reported on 06/07/2018 07/18/17   Waynetta Pean, PA-C  Diclofenac Sodium 3 % GEL Place 1 application onto the skin 2 (two) times daily. To affected area. Patient not taking: Reported on 06/07/2018 07/18/17   Waynetta Pean, PA-C  HYDROcodone-acetaminophen (NORCO) 5-325 MG tablet Take 1-2 tablets by mouth every 6 (six) hours as needed. Patient not taking: Reported on 06/07/2018 07/22/17   Veryl Speak, MD    Inpatient Medications: Scheduled Meds: . apixaban  2.5 mg Oral BID  . atorvastatin  40 mg Oral q1800  . carvedilol  12.5 mg Oral BID WC  . cholecalciferol  1,000 Units Oral QHS  . furosemide  40 mg Intravenous BID  . pantoprazole  40 mg Oral BID AC   Continuous Infusions: . sodium chloride 500 mL (06/07/18 1158)  . amiodarone 30 mg/hr (06/08/18 0630)   PRN Meds: sodium chloride, acetaminophen **OR** acetaminophen, ALPRAZolam, prochlorperazine  Allergies:    Allergies  Allergen Reactions  . Strawberry Extract Hives, Itching, Swelling and Other (See Comments)    Hives and itching     Social History:   Social History   Socioeconomic History  . Marital status: Legally Separated    Spouse name: Not on file  . Number of children: Not on file  . Years of education: Not on file  . Highest  education level: Not on file  Occupational History  . Not on file  Social Needs  . Financial resource strain: Not on file  . Food insecurity:    Worry: Not on file    Inability: Not on file  . Transportation needs:    Medical: Not on file    Non-medical: Not on file  Tobacco Use  . Smoking status: Former Research scientist (life sciences)  . Smokeless tobacco: Never Used  Substance and Sexual Activity  . Alcohol use: No  . Drug use: No  . Sexual activity: Not on file  Lifestyle  . Physical activity:    Days per week: Not on file    Minutes per session: Not on file  . Stress: Not on file  Relationships  . Social connections:    Talks on phone: Not on file    Gets together: Not on file    Attends religious service: Not on file    Active member of club or organization: Not on file    Attends meetings of clubs or organizations: Not on file    Relationship status: Not on  file  . Intimate partner violence:    Fear of current or ex partner: Not on file    Emotionally abused: Not on file    Physically abused: Not on file    Forced sexual activity: Not on file  Other Topics Concern  . Not on file  Social History Narrative  . Not on file    Family History:   Family History  Problem Relation Age of Onset  . Cancer Mother      ROS:  Please see the history of present illness.  All other ROS reviewed and negative.     Physical Exam/Data:   Vitals:   06/07/18 2021 06/07/18 2200 06/08/18 0105 06/08/18 0524  BP: 116/63  (!) 125/50 120/71  Pulse: (!) 50  (!) 50 (!) 50  Resp: 18  15   Temp: 98.4 F (36.9 C)  98.3 F (36.8 C) 97.7 F (36.5 C)  TempSrc: Oral  Oral Oral  SpO2: 98% 96% 94% 91%  Weight:    61.1 kg  Height:        Intake/Output Summary (Last 24 hours) at 06/08/2018 0807 Last data filed at 06/08/2018 0339 Gross per 24 hour  Intake 508.64 ml  Output 1450 ml  Net -941.36 ml   Filed Weights   06/07/18 0450 06/07/18 1413 06/08/18 0524  Weight: 68 kg 60.9 kg 61.1 kg   Body mass  index is 21.76 kg/m.  General:  Well nourished, well developed, in no acute distress HEENT: normal Lymph: no adenopathy Neck: no JVD Endocrine:  No thryomegaly Vascular: No carotid bruits Cardiac:  RRR; bradycardic, 1/6 SM, no gallops or rubs Lungs:  CTA b/l,  no wheezing, rhonchi or rales  Abd: soft, nontender, no hepatomegaly  Ext: no edema Musculoskeletal:  No deformities, age appropriate atrophy Skin: warm and dry  Neuro:  no focal abnormalities noted Psych:  Normal affect   EKG:  The EKG was personally reviewed and demonstrates:   VT, RBBB,  neg lead I-II, 151/149bpm Most recent is SR, 66bpm, , nonspecific IVCD, LAD Telemetry:  Telemetry was personally reviewed and demonstrates:   Has maintained SB 50's-60's without recurrent VT  Relevant CV Studies:  06/07/18 TTE Study Conclusions - Left ventricle: The cavity size was severely dilated. There was   moderate concentric hypertrophy. Systolic function was severely   reduced. The estimated ejection fraction was in the range of 20%   to 25%. Severe global hypokinesis with inferior, apical and   lateral akinesis. The study is not technically sufficient to   allow evaluation of LV diastolic function. - Mitral valve: There was moderate to severe posteriorly directed   regurgitation. Valve area by continuity equation (using LVOT   flow): 2.01 cm^2. - Left atrium: Severely dilated. - Right ventricle: The cavity size was mildly dilated. Moderately   reduced systolic function. - Tricuspid valve: There was moderate regurgitation. - Pulmonary arteries: PA peak pressure: 41 mm Hg (S). - Systemic veins: The IVC measures <2.1 cm, but does not collapse   >50%, suggesting an elevated RA pressure of 8 mmHg. - Pericardium, extracardiac: A trivial pericardial effusion was   identified posterior to the heart. Impressions: - Compared to a prior study in 2018, the LVEF is similar around 25%   with inferior, apical and lateral akinesis and  global   hypokinesis, severe LAE, moderate to severe MR, moderate TR and   an RVSP of 41 mmHg. A trivial posterior pericardial effusion was   noted.  cardiac catheterization in 2014  Laboratory Data:  Chemistry Recent Labs  Lab 06/07/18 0454 06/08/18 0703  NA 141 140  K 4.2 3.2*  CL 108 105  CO2 22 23  GLUCOSE 140* 103*  BUN 29* 23  CREATININE 1.91* 1.48*  CALCIUM 8.9 8.3*  GFRNONAA 25* 35*  GFRAA 29* 40*  ANIONGAP 11 12    No results for input(s): PROT, ALBUMIN, AST, ALT, ALKPHOS, BILITOT in the last 168 hours. Hematology Recent Labs  Lab 06/07/18 0454 06/08/18 0703  WBC 5.8 5.7  RBC 4.29 3.93  HGB 9.6* 8.8*  HCT 31.8* 28.2*  MCV 74.1* 71.8*  MCH 22.4* 22.4*  MCHC 30.2 31.2  RDW 17.2* 16.9*  PLT 215 183   Cardiac Enzymes Recent Labs  Lab 06/07/18 0454 06/07/18 0822 06/07/18 1336 06/07/18 2013  TROPONINI 0.10* 0.15* 0.40* 0.72*   No results for input(s): TROPIPOC in the last 168 hours.  BNP Recent Labs  Lab 06/07/18 0454  BNP 1,111.3*    DDimer No results for input(s): DDIMER in the last 168 hours.  Radiology/Studies:   Dg Chest Portable 1 View Result Date: 06/07/2018 CLINICAL DATA:  72 y/o  F; chest pain and shortness of breath. EXAM: PORTABLE CHEST 1 VIEW COMPARISON:  07/21/2017 chest radiograph FINDINGS: Stable cardiomegaly given projection and technique. Two lead AICD noted. Aortic atherosclerosis with calcification. Diffuse reticular opacities of the lungs. No consolidation, effusion, or pneumothorax. No acute osseous abnormality is evident. IMPRESSION: Stable cardiomegaly.  Interstitial pulmonary edema. Electronically Signed   By: Kristine Garbe M.D.   On: 06/07/2018 06:07    Assessment and Plan:   1. CP     Abn Trop, suspect to be demand ischemia by card service, last Trop still rising at 0.72     no ongoing CP       2. VT     Has hx by Dr. Olin Pia notes of aborted cardiac arrest, device secondary prevention     VT was slow  149-151bpm, well below her detection rates (1st zone 181bpm)     Agree with amiodarone, continue gtt for today, if no VT today to transition to PO tomorrow     Will re-visit zones with Dr. Rayann Heman, especially with amio being brought back on board with an already slow VT  3. Acute/chronic CHF     IV lasix     Fluid neg -971ml     Renal function stable (creat down from yesterday actually)     Exam does not suggest fluid OL today, resume PO     home meds include coreg, lasix.  No ACE/ARB presumably 2/2 CKD)     K+ replacement ordered  4. Paroxysmal AFib     CHA2DS2Vasc is 4, on Eliquis, appropriately dosed 5. Anemic     Doesn't look too far off from where she has been historically     Will defer to medicine service            Hopefully can discharge by Sunday        For questions or updates, please contact [facility] Please consult [email or web address] for contact info under     Signed, [name], PA-C  06/08/2018 8:07 AM   I have seen, examined the patient, and reviewed the above assessment and plan.  Changes to above are made where necessary.  On exam, RRR.  Pt admitted with symptomatic VT at 150 bpm.  She has a known h/o VT.  Amiodarone previously discontinued.  Her current VT was 150 bpm--> below her  device detection zone of 181 bpm.  Her ICD has been interrogated and personally reviewed by me.  I have adjusted VT1 zone to 144 bpm-180 bpm with ATP x 3 (burst), ATP x 3 (ramp/scan) and no shocks.  VT2 181-239 bpm with ATP x 3 (burst) followed by 30J then 36J shocks x 3,  VF 240 bpm ATP x 1 then 30J then 36 J shocks x 5.  We will transition to oral amiodarone today. Anticipate discharge tomorrow on Amiodarone 200mg  BID  Will need follow-up with Dr Caryl Comes or EP PA in 2-4 weeks.  Co Sign: Thompson Grayer, MD 06/08/2018 10:02 PM

## 2018-06-08 NOTE — Progress Notes (Signed)
ANTICOAGULATION CONSULT NOTE - [place] for apixaban Indication: atrial fibrillation  Allergies  Allergen Reactions  . Strawberry Extract Hives, Itching, Swelling and Other (See Comments)    Hives and itching     Patient Measurements: Height: 5\' 6"  (167.6 cm) Weight: 134 lb 12.8 oz (61.1 kg) IBW/kg (Calculated) : 59.3   Vital Signs: Temp: 97.7 F (36.5 C) (12/13 0524) Temp Source: Oral (12/13 0524) BP: 120/71 (12/13 0524) Pulse Rate: 50 (12/13 0524)  Labs: Recent Labs    06/07/18 0454 06/07/18 0455 06/07/18 0822 06/07/18 1336 06/07/18 2013 06/08/18 0703  HGB 9.6*  --   --   --   --  8.8*  HCT 31.8*  --   --   --   --  28.2*  PLT 215  --   --   --   --  183  APTT  --  41*  --  116*  --   --   HEPARINUNFRC  --  0.41  --  0.68  --   --   CREATININE 1.91*  --   --   --   --  1.48*  TROPONINI 0.10*  --  0.15* 0.40* 0.72*  --     Estimated Creatinine Clearance: 31.2 mL/min (A) (by C-G formula based on SCr of 1.48 mg/dL (H)).   Medical History: Past Medical History:  Diagnosis Date  . Chronic renal insufficiency, stage III (moderate) (HCC)    Cr 2.0 5/15  . Chronic systolic CHF (congestive heart failure) ([place])   . History of tobacco abuse   . Hyperlipidemia   . Hypertension   . Nonischemic cardiomyopathy ([place])    EF 10-15% on 11/2012 cath  . Sinus bradycardia 12/21/2012  . Ventricular tachycardia ([place]) 12/14/2012    Medications:  Medications Prior to Admission  Medication Sig Dispense Refill Last Dose  . apixaban (ELIQUIS) 5 MG TABS tablet Take 5 mg by mouth 2 (two) times daily.   06/01/2018 at 1800  . atorvastatin (LIPITOR) 40 MG tablet Take 1 tablet (40 mg total) by mouth daily at 6 PM. 30 tablet 0 06/06/2018 at Unknown time  . carvedilol (COREG) 12.5 MG tablet Take 12.5 mg by mouth 2 (two) times daily with a meal.   06/06/2018 at 1800  . Cholecalciferol (VITAMIN D-3 PO) Take 1,000 Units by mouth at bedtime.    06/06/2018 at Unknown  time  . furosemide (LASIX) 40 MG tablet Take 80 mg by mouth daily.    06/06/2018 at Unknown time  . pantoprazole (PROTONIX) 40 MG tablet Take 1 tablet (40 mg total) by mouth 2 (two) times daily before a meal. 120 tablet 0 06/06/2018 at Unknown time  . acetaminophen (TYLENOL) 325 MG tablet Take 2 tablets (650 mg total) by mouth every 6 (six) hours as needed for mild pain or moderate pain. (Patient not taking: Reported on 06/07/2018) 60 tablet 0 Not Taking at Unknown time  . Diclofenac Sodium 3 % GEL Place 1 application onto the skin 2 (two) times daily. To affected area. (Patient not taking: Reported on 06/07/2018) 100 g 0 Not Taking at Unknown time  . HYDROcodone-acetaminophen (NORCO) 5-325 MG tablet Take 1-2 tablets by mouth every 6 (six) hours as needed. (Patient not taking: Reported on 06/07/2018) 12 tablet 0 Completed Course at Unknown time   Scheduled:  . apixaban  2.5 mg Oral BID  . atorvastatin  40 mg Oral q1800  . carvedilol  12.5 mg Oral BID WC  . cholecalciferol  1,000 Units Oral QHS  . furosemide  40 mg Intravenous BID  . pantoprazole  40 mg Oral BID AC    Assessment: 72 yo female here with CP (on apixaban5mg  po bid PTA for afib). Pt initially on IV heparin but now transitioned back to apixaban. Pt noted to have AKI on admit which has now resolved with SCr of 1.48 today. Therefore, will adjust apixaban back to PTA dose (age < 74 yr, wt >60kg)   Goal of Therapy:  Monitor platelets by anticoagulation protocol: Yes   Plan:   -Increase apixaban to 5mg  BID -BMET tomorrow  Arrie Senate, PharmD, BCPS Clinical Pharmacist [phone] Please check AMION for all [facility] numbers 06/08/2018

## 2018-06-09 LAB — BASIC METABOLIC PANEL
Anion gap: 9 (ref 5–15)
BUN: 22 mg/dL (ref 8–23)
CO2: 24 mmol/L (ref 22–32)
Calcium: 8.4 mg/dL — ABNORMAL LOW (ref 8.9–10.3)
Chloride: 108 mmol/L (ref 98–111)
Creatinine, Ser: 1.74 mg/dL — ABNORMAL HIGH (ref 0.44–1.00)
GFR calc Af Amer: 33 mL/min — ABNORMAL LOW (ref >60)
GFR calc non Af Amer: 28 mL/min — ABNORMAL LOW (ref >60)
Glucose, Bld: 89 mg/dL (ref 70–99)
Potassium: 3.4 mmol/L — ABNORMAL LOW (ref 3.5–5.1)
Sodium: 141 mmol/L (ref 135–145)

## 2018-06-09 LAB — POTASSIUM
Potassium: 3.2 mmol/L — ABNORMAL LOW (ref 3.5–5.1)
Potassium: 4.1 mmol/L (ref 3.5–5.1)

## 2018-06-09 LAB — HEMOGLOBIN AND HEMATOCRIT, BLOOD
HCT: 31.5 % — ABNORMAL LOW (ref 36.0–46.0)
Hemoglobin: 9.5 g/dL — ABNORMAL LOW (ref 12.0–15.0)

## 2018-06-09 MED ORDER — POTASSIUM CHLORIDE 20 MEQ PO PACK
40.0000 meq | PACK | Freq: Once | ORAL | Status: AC
  Administered 2018-06-09: 40 meq via ORAL
  Filled 2018-06-09: qty 2

## 2018-06-09 MED ORDER — MAGNESIUM SULFATE IN D5W 1-5 GM/100ML-% IV SOLN
1.0000 g | Freq: Once | INTRAVENOUS | Status: AC
  Administered 2018-06-09: 1 g via INTRAVENOUS
  Filled 2018-06-09: qty 100

## 2018-06-09 MED ORDER — FUROSEMIDE 10 MG/ML IJ SOLN
20.0000 mg | Freq: Once | INTRAMUSCULAR | Status: AC
  Administered 2018-06-09: 20 mg via INTRAVENOUS
  Filled 2018-06-09: qty 2

## 2018-06-09 MED ORDER — POTASSIUM CHLORIDE CRYS ER 10 MEQ PO TBCR: 10.0000 meq | EXTENDED_RELEASE_TABLET | Freq: Every day | ORAL | 0 refills | Status: DC

## 2018-06-09 MED ORDER — POTASSIUM CHLORIDE CRYS ER 20 MEQ PO TBCR
40.0000 meq | EXTENDED_RELEASE_TABLET | Freq: Once | ORAL | Status: AC
  Administered 2018-06-09: 40 meq via ORAL
  Filled 2018-06-09: qty 2

## 2018-06-09 MED ORDER — POTASSIUM CHLORIDE CRYS ER 20 MEQ PO TBCR: 20.0000 meq | EXTENDED_RELEASE_TABLET | Freq: Every day | ORAL | 0 refills | Status: DC

## 2018-06-09 MED ORDER — POTASSIUM CHLORIDE 20 MEQ PO PACK: 20.0000 meq | PACK | Freq: Once | ORAL | Status: DC

## 2018-06-09 MED ORDER — HYDROCODONE-ACETAMINOPHEN 5-325 MG PO TABS
1.0000 | ORAL_TABLET | Freq: Four times a day (QID) | ORAL | Status: DC | PRN
  Administered 2018-06-10 – 2018-06-11 (×3): 1 via ORAL
  Administered 2018-06-11: 2 via ORAL
  Administered 2018-06-12 – 2018-06-13 (×3): 1 via ORAL
  Filled 2018-06-09 (×2): qty 1
  Filled 2018-06-09: qty 2
  Filled 2018-06-09: qty 1
  Filled 2018-06-09 (×2): qty 2
  Filled 2018-06-09: qty 1

## 2018-06-09 MED ORDER — FUROSEMIDE 10 MG/ML IJ SOLN
40.0000 mg | Freq: Two times a day (BID) | INTRAMUSCULAR | Status: DC
  Administered 2018-06-09 – 2018-06-12 (×6): 40 mg via INTRAVENOUS
  Filled 2018-06-09 (×6): qty 4

## 2018-06-09 MED ORDER — AMIODARONE HCL 200 MG PO TABS: 200.0000 mg | ORAL_TABLET | Freq: Two times a day (BID) | ORAL | 0 refills | Status: DC

## 2018-06-09 NOTE — Progress Notes (Signed)
PROGRESS NOTE    Stephanie Yates  IHW:[id number]  DOB: 1945-07-27  DOA: 06/07/2018 PCP: Josetta Huddle, MD  Brief Narrative:   72 y/o female with history of paroxysmal atrial fibrillation, tachybrady syndrome status post AICD, hypertension, hyperlipidemia, chronic kidney disease stage III, chronic combined systolic and diastolic CHF with EF of 20 to 25% who is on chronic diuretics/AV blockers/anticoagulation at baseline presented with chest pain associated with dyspnea, diaphoresis and nausea.  Upon arrival in the ED patient noted to be tachycardic with heart rate in 150s and associated hypotension with systolic blood pressure in 80s.  EKG showed wide-complex tachycardia.  Troponin peak 2.4.  Chest x-ray showed cardiomegaly and pulmonary edema with BNP greater than 1000.  Cardioversion planned in the ED initially by cardiology but patient spontaneously converted to sinus rhythm.  AICD interrogated in the ED with?  No significant arrhythmias  Subjective:   Patient seen by cardiology yesterday and diuretics/amiodarone drip changed to oral medications.  Discharge planned earlier today but patient was dyspneic on walking back from the bathroom although was able to maintain O2 sat at 95%..  Daughter not comfortable with discharge plan. Objective: Vitals:   06/08/18 1937 06/09/18 0524 06/09/18 0918 06/09/18 1512  BP: 136/87 (!) 147/73 (!) 120/56 135/66  Pulse: (!) 56 (!) 51 (!) 50 (!) 53  Resp: 20 16  15   Temp: 98.8 F (37.1 C) 98.2 F (36.8 C)  98 F (36.7 C)  TempSrc: Oral Oral  Oral  SpO2: 92% 95%  96%  Weight:  60 kg    Height:        Intake/Output Summary (Last 24 hours) at 06/09/2018 1808 Last data filed at 06/09/2018 1300 Gross per 24 hour  Intake 720 ml  Output -  Net 720 ml   Filed Weights   06/07/18 1413 06/08/18 0524 06/09/18 0524  Weight: 60.9 kg 61.1 kg 60 kg    Physical Examination:  General exam: Appears calm and comfortable  Respiratory system: Clear to  auscultation. Respiratory effort normal. Cardiovascular system: S1 & S2 heard, RRR. No JVD, murmurs, rubs, gallops or clicks. No pedal edema. Gastrointestinal system: Abdomen is nondistended, soft and nontender. No organomegaly or masses felt. Normal bowel sounds heard. Central nervous system: Alert and oriented. No focal neurological deficits. Extremities: Symmetric 5 x 5 power. Skin: No rashes, lesions or ulcers Psychiatry: Judgement and insight appear normal. Mood & affect appropriate.     Data Reviewed: I have personally reviewed following labs and imaging studies  CBC: Recent Labs  Lab 06/07/18 0454 06/08/18 0703 06/09/18 0944  WBC 5.8 5.7  --   HGB 9.6* 8.8* 9.5*  HCT 31.8* 28.2* 31.5*  MCV 74.1* 71.8*  --   PLT 215 183  --    Basic Metabolic Panel: Recent Labs  Lab 06/07/18 0454 06/08/18 0703 06/09/18 0425 06/09/18 0944 06/09/18 1451  NA 141 140 141  --   --   K 4.2 3.2* 3.4* 3.2* 4.1  CL 108 105 108  --   --   CO2 22 23 24   --   --   GLUCOSE 140* 103* 89  --   --   BUN 29* 23 22  --   --   CREATININE 1.91* 1.48* 1.74*  --   --   CALCIUM 8.9 8.3* 8.4*  --   --   MG 1.9  --   --   --   --    GFR: Estimated Creatinine Clearance: 26.6 mL/min (A) (by C-G  formula based on SCr of 1.74 mg/dL (H)). Liver Function Tests: No results for input(s): AST, ALT, ALKPHOS, BILITOT, PROT, ALBUMIN in the last 168 hours. No results for input(s): LIPASE, AMYLASE in the last 168 hours. No results for input(s): AMMONIA in the last 168 hours. Coagulation Profile: No results for input(s): INR, PROTIME in the last 168 hours. Cardiac Enzymes: Recent Labs  Lab 06/07/18 0454 06/07/18 0822 06/07/18 1336 06/07/18 2013  TROPONINI 0.10* 0.15* 0.40* 0.72*   BNP (last 3 results) No results for input(s): PROBNP in the last 8760 hours. HbA1C: Recent Labs    06/08/18 0703  HGBA1C 5.4   CBG: No results for input(s): GLUCAP in the last 168 hours. Lipid Profile: No results for  input(s): CHOL, HDL, LDLCALC, TRIG, CHOLHDL, LDLDIRECT in the last 72 hours. Thyroid Function Tests: No results for input(s): TSH, T4TOTAL, FREET4, T3FREE, THYROIDAB in the last 72 hours. Anemia Panel: No results for input(s): VITAMINB12, FOLATE, FERRITIN, TIBC, IRON, RETICCTPCT in the last 72 hours. Sepsis Labs: No results for input(s): PROCALCITON, LATICACIDVEN in the last 168 hours.  No results found for this or any previous visit (from the past 240 hour(s)).    Radiology Studies: No results found.      Scheduled Meds: . amiodarone  200 mg Oral BID  . apixaban  5 mg Oral BID  . atorvastatin  40 mg Oral q1800  . carvedilol  12.5 mg Oral BID WC  . cholecalciferol  1,000 Units Oral QHS  . feeding supplement (ENSURE ENLIVE)  237 mL Oral BID BM  . furosemide  40 mg Intravenous Q12H  . pantoprazole  40 mg Oral BID AC   Continuous Infusions: . sodium chloride 500 mL (06/07/18 1158)    Assessment & Plan:    1.  Wide-complex tachycardia: Ventricular tachycardia versus A. fib with RVR/aberrancy.  Appreciate cardiology evaluation.  Patient received IV lidocaine in the ED and converted to sinus rhythm.  Cardiology started patient on amiodarone drip.  Continue anticoagulation and Coreg.  Per daughter patient due for AICD change in 2020.  2.  Acute on chronic combined systolic/diastolic CHF with EF 20 to 25%: Admission chest x-ray showed pulmonary edema with elevated BNP.  Will resume IV Lasix for another day and monitor daily weights/I's and O's  3.  Chronic kidney disease stage III: Baseline creatinine around 1.8-2.  Watch on diuretics.  4.  Hyperlipidemia: Continue statins  5.  Hypertension: Patient was hypotensive in the setting of tachycardia upon presentation.  Currently heart rate and blood pressure improved.  Resume Coreg with holding parameters  4.  Hypo-kalemia: Replace with goal to be above 4 and magnesium greater than 2.  Patient reports being on potassium supplements  twice daily as outpatient previously but apparently was told to discontinue as she had hyperkalemia at some point.  She will likely need 20 mEq potassium daily upon discharge to take with Lasix 80 mg p.o. with close outpatient BMP follow-up as discussed with daughter today.  6.  Chronic anemia: Patient's baseline hemoglobin around 9.5.  Labs today show hemoglobin at 8.8 likely dilutional component.  No signs of bleeding.  Continue anticoagulation and monitor.    DVT prophylaxis: On anticoagulation Code Status: Full code Family / Patient Communication: Discussed with patient and daughter bedside Disposition Plan: Home    LOS: 1 day    Time spent: 35 minutes    Guilford Shi, MD [facility] Pager [phone]  If 7PM-7AM, please contact night-coverage [email or web address] Password [facility] 06/09/2018, 6:08  PM  

## 2018-06-09 NOTE — Discharge Summary (Addendum)
Physician Discharge Summary  Stephanie Yates IRC:[id number] DOB: 03/16/1946 DOA: 06/07/2018  PCP: Josetta Huddle, MD  Admit date: 06/07/2018 Discharge date: 06/09/2018 Consultations:  Cardiology Dr Stanford Breed, Electrophysiology Dr Rayann Heman Admitted From: home Disposition:  home  Discharge Diagnoses:  Principal Problem:   Acute on chronic combined systolic and diastolic heart failure ([place]) Active Problems:   Ventricular tachycardia (HCC)   Hypertension   Elevated troponin   PAF (paroxysmal atrial fibrillation) ([place])   ICD (implantable cardioverter-defibrillator), dual, [facility]   HLD (hyperlipidemia)   CKD (chronic kidney disease), stage III (HCC)   Chest pain   Anemia   Rapid atrial fibrillation ([place])   Brief/Interim Summary: 72 y/o female with history of paroxysmal atrial fibrillation, tachybrady syndrome status post AICD, hypertension, hyperlipidemia, chronic kidney disease stage III, chronic combined systolic and diastolic CHF with EF of 20 to 25% who is on chronic diuretics/AV blockers/anticoagulation at baseline presented with chest pain associated with dyspnea, diaphoresis and nausea.  Upon arrival in the ED patient noted to be tachycardic with heart rate in 150s and associated hypotension with systolic blood pressure in 80s.  EKG showed wide-complex tachycardia.  Troponin peak 2.4.  Chest x-ray showed cardiomegaly and pulmonary edema with BNP greater than 1000.  Cardioversion planned in the ED initially by cardiology but patient spontaneously converted to sinus rhythm.  AICD interrogated in the ED and patient admitted with requests for cardiology/EP evaluation.   1.  Wide-complex tachycardia: Ventricular tachycardia versus A. fib with RVR/aberrancy.  Appreciate cardiology evaluation.  Patient received IV lidocaine in the ED and converted to sinus rhythm.  Cardiology started patient on amiodarone drip and transitioned now to oral. Patient was in fact on amiodarone previously but was  later discontinued for unclear reasons. Continue anticoagulation and Coreg.  Per daughter patient due for AICD change in 2020. Patient did elevated troponin with peak to 0.7 which is likely due to demand ischemia per cardiology.  Per EP note device interrogation showed :  "1-AMS, AFlutter 10 seconds in Sept No VT/VF episodes  CorView looks at/just below threshold (OK) AP 8.9% VP 1.5% Brady pacing programmed for 50bpm"  .    2.  Acute on chronic combined systolic/diastolic CHF with EF 20 to 25%: Admission chest x-ray showed pulmonary edema with elevated BNP.  Patient admitted with IV Lasix and shows net neagtive fluid balance of 600 ml and daily weights show drop by 2 pounds. Patient is saturating well on RA and appears euvolemic. Lasix now transitioned to oral and can resume home dose on discharge.   3.  Chronic kidney disease stage III: Baseline creatinine around 1.8-2.  Been stable on diuretics.  Labs today show creat at 1.7.  4.  Hyperlipidemia: Continue statins  5.  Hypertension: Patient was hypotensive in the setting of tachycardia upon presentation.  Currently heart rate and blood pressure improved.  Resume home regimen  4.  Hypo-kalemia: Will order Replacement as inpatient and outpatient with goal to keep potassium  above 4 and magnesium greater than 2.   6.  Chronic anemia: Patient's baseline hemoglobin around 9.5, dropped to hemoglobin at 8.8 likely dilutional component.  No signs of bleeding.  Continue anticoagulation and repeat H&H prior to discharge.    Discharge Exam: Vitals:   06/08/18 1937 06/09/18 0524  BP: 136/87 (!) 147/73  Pulse: (!) 56 (!) 51  Resp: 20 16  Temp: 98.8 F (37.1 C) 98.2 F (36.8 C)  SpO2: 92% 95%   Vitals:   06/08/18 1644 06/08/18 1805  06/08/18 1937 06/09/18 0524  BP: 131/69 126/64 136/87 (!) 147/73  Pulse: (!) 56 (!) 55 (!) 56 (!) 51  Resp:   20 16  Temp:   98.8 F (37.1 C) 98.2 F (36.8 C)  TempSrc:   Oral Oral  SpO2: 96%  92% 95%   Weight:    60 kg  Height:        General: Pt is alert, awake, not in acute distress Cardiovascular: RRR, S1/S2 +, no rubs, no gallops Respiratory: CTA bilaterally, no wheezing, no rhonchi Abdominal: Soft, NT, ND, bowel sounds + Extremities: no edema, no cyanosis  Discharge Instructions  Discharge Instructions    Amb referral to AFIB Clinic   Complete by:  As directed    Call MD for:  extreme fatigue   Complete by:  As directed    Call MD for:  persistant dizziness or light-headedness   Complete by:  As directed    Call MD for:  severe uncontrolled pain   Complete by:  As directed    Call MD for:  temperature >100.4   Complete by:  As directed    Diet - low sodium heart healthy   Complete by:  As directed    Increase activity slowly   Complete by:  As directed      Allergies as of 06/09/2018      Reactions   Strawberry Extract Hives, Itching, Swelling, Other (See Comments)   Hives and itching      Medication List    STOP taking these medications   Diclofenac Sodium 3 % Gel     TAKE these medications   acetaminophen 325 MG tablet Commonly known as:  TYLENOL Take 2 tablets (650 mg total) by mouth every 6 (six) hours as needed for mild pain or moderate pain.   amiodarone 200 MG tablet Commonly known as:  PACERONE Take 1 tablet (200 mg total) by mouth 2 (two) times daily.   apixaban 5 MG Tabs tablet Commonly known as:  ELIQUIS Take 5 mg by mouth 2 (two) times daily.   atorvastatin 40 MG tablet Commonly known as:  LIPITOR Take 1 tablet (40 mg total) by mouth daily at 6 PM.   carvedilol 12.5 MG tablet Commonly known as:  COREG Take 12.5 mg by mouth 2 (two) times daily with a meal.   furosemide 40 MG tablet Commonly known as:  LASIX Take 80 mg by mouth daily.   HYDROcodone-acetaminophen 5-325 MG tablet Commonly known as:  NORCO Take 1-2 tablets by mouth every 6 (six) hours as needed.   pantoprazole 40 MG tablet Commonly known as:  PROTONIX Take 1  tablet (40 mg total) by mouth 2 (two) times daily before a meal.   potassium chloride 10 MEQ tablet Commonly known as:  K-DUR,KLOR-CON Take 1 tablet (10 mEq total) by mouth daily for 10 doses.   VITAMIN D-3 PO Take 1,000 Units by mouth at bedtime.      Follow-up Information    Deboraha Sprang, MD Follow up.   Specialty:  Cardiology Why:  You will be called by Dr. Olin Pia scheduler to make an appointment to see him in follow up (in the next 3 weeks) Contact information: [address] [place] [address] [phone]          Allergies  Allergen Reactions  . Strawberry Extract Hives, Itching, Swelling and Other (See Comments)    Hives and itching        Discharge Condition: stable  CODE STATUS: Full code Diet recommendation: low salt, low fat diet Recommendations for Outpatient Follow-up:  1. Follow up with PCP in 1 weeks 2. Please obtain BMP/CBC in one week 3. Please follow up with Cardiology/EP Dr Caryl Comes in 2 weeks      The results of significant diagnostics from this hospitalization (including imaging, microbiology, ancillary and laboratory) are listed below for reference.     Microbiology: No results found for this or any previous visit (from the past 240 hour(s)).   Labs: BNP (last 3 results) Recent Labs    06/15/17 1902 06/07/18 0454  BNP 594.9* 6,295.2*   Basic Metabolic Panel: Recent Labs  Lab 06/07/18 0454 06/08/18 0703 06/09/18 0425  NA 141 140 141  K 4.2 3.2* 3.4*  CL 108 105 108  CO2 22 23 24   GLUCOSE 140* 103* 89  BUN 29* 23 22  CREATININE 1.91* 1.48* 1.74*  CALCIUM 8.9 8.3* 8.4*  MG 1.9  --   --    Liver Function Tests: No results for input(s): AST, ALT, ALKPHOS, BILITOT, PROT, ALBUMIN in the last 168 hours. No results for input(s): LIPASE, AMYLASE in the last 168 hours. No results for input(s): AMMONIA in the last 168 hours. CBC: Recent Labs  Lab 06/07/18 0454 06/08/18 0703  WBC 5.8 5.7  HGB 9.6* 8.8*   HCT 31.8* 28.2*  MCV 74.1* 71.8*  PLT 215 183   Cardiac Enzymes: Recent Labs  Lab 06/07/18 0454 06/07/18 0822 06/07/18 1336 06/07/18 2013  TROPONINI 0.10* 0.15* 0.40* 0.72*   BNP: Invalid input(s): POCBNP CBG: No results for input(s): GLUCAP in the last 168 hours. D-Dimer No results for input(s): DDIMER in the last 72 hours. Hgb A1c Recent Labs    06/08/18 0703  HGBA1C 5.4   Lipid Profile No results for input(s): CHOL, HDL, LDLCALC, TRIG, CHOLHDL, LDLDIRECT in the last 72 hours. Thyroid function studies No results for input(s): TSH, T4TOTAL, T3FREE, THYROIDAB in the last 72 hours.  Invalid input(s): FREET3 Anemia work up No results for input(s): VITAMINB12, FOLATE, FERRITIN, TIBC, IRON, RETICCTPCT in the last 72 hours. Urinalysis    Component Value Date/Time   COLORURINE YELLOW 08/29/2014 2035   APPEARANCEUR CLOUDY (A) 08/29/2014 2035   LABSPEC 1.017 08/29/2014 2035   PHURINE 6.0 08/29/2014 2035   GLUCOSEU NEGATIVE 08/29/2014 2035   HGBUR SMALL (A) 08/29/2014 2035   BILIRUBINUR NEGATIVE 08/29/2014 2035   KETONESUR NEGATIVE 08/29/2014 2035   PROTEINUR NEGATIVE 08/29/2014 2035   UROBILINOGEN 1.0 08/29/2014 2035   NITRITE NEGATIVE 08/29/2014 2035   LEUKOCYTESUR TRACE (A) 08/29/2014 2035   Sepsis Labs Invalid input(s): PROCALCITONIN,  WBC,  LACTICIDVEN Microbiology No results found for this or any previous visit (from the past 240 hour(s)).  Procedures/Studies: Dg Chest Portable 1 View  Result Date: 06/07/2018 CLINICAL DATA:  72 y/o  F; chest pain and shortness of breath. EXAM: PORTABLE CHEST 1 VIEW COMPARISON:  07/21/2017 chest radiograph FINDINGS: Stable cardiomegaly given projection and technique. Two lead AICD noted. Aortic atherosclerosis with calcification. Diffuse reticular opacities of the lungs. No consolidation, effusion, or pneumothorax. No acute osseous abnormality is evident. IMPRESSION: Stable cardiomegaly.  Interstitial pulmonary edema.  Electronically Signed   By: Kristine Garbe M.D.   On: 06/07/2018 06:07   (Echo, Carotid, EGD, Colonoscopy, ERCP)  Time coordinating discharge: Over 30 minutes  SIGNED:   Guilford Shi, MD  [facility] 06/09/2018, 9:04 AM Pager   If 7PM-7AM, please contact night-coverage [email or web address] Password [email or web address]

## 2018-06-09 NOTE — Plan of Care (Signed)
  Problem: Pain Managment: Goal: General experience of comfort will improve Outcome: Progressing Note:  Pain controlled with tylenol po.   Problem: Safety: Goal: Ability to remain free from injury will improve Outcome: Progressing Note:  Ambulates to bathroom with standby assist without difficulty.  Calls staff before getting out of bed.

## 2018-06-09 NOTE — Consult Note (Signed)
Progress Note  Patient Name: Stephanie Yates Date of Encounter: 06/09/2018  Primary Cardiologist: Stephanie Yates  Subjective   Feeling well, mildly SOB  Inpatient Medications    Scheduled Meds: . amiodarone  200 mg Oral BID  . apixaban  5 mg Oral BID  . atorvastatin  40 mg Oral q1800  . carvedilol  12.5 mg Oral BID WC  . cholecalciferol  1,000 Units Oral QHS  . feeding supplement (ENSURE ENLIVE)  237 mL Oral BID BM  . furosemide  40 mg Oral Daily  . pantoprazole  40 mg Oral BID AC  . potassium chloride  40 mEq Oral Once   Continuous Infusions: . sodium chloride 500 mL (06/07/18 1158)  . magnesium sulfate 1 - 4 g bolus IVPB     PRN Meds: sodium chloride, acetaminophen **OR** acetaminophen, ALPRAZolam, prochlorperazine   Vital Signs    Vitals:   06/08/18 1805 06/08/18 1937 06/09/18 0524 06/09/18 0918  BP: 126/64 136/87 (!) 147/73 (!) 120/56  Pulse: (!) 55 (!) 56 (!) 51 (!) 50  Resp:  20 16   Temp:  98.8 F (37.1 C) 98.2 F (36.8 C)   TempSrc:  Oral Oral   SpO2:  92% 95%   Weight:   60 kg   Height:        Intake/Output Summary (Last 24 hours) at 06/09/2018 0948 Last data filed at 06/09/2018 0900 Gross per 24 hour  Intake 840 ml  Output 1450 ml  Net -610 ml   Filed Weights   06/07/18 1413 06/08/18 0524 06/09/18 0524  Weight: 60.9 kg 61.1 kg 60 kg    Telemetry    Sinus rhythm with PVCs - Personally Reviewed  ECG    None new - Personally Reviewed  Physical Exam   GEN: No acute distress.   Neck: No JVD Cardiac: RRR, no murmurs, rubs, or gallops.  Respiratory: Clear to auscultation bilaterally. GI: Soft, nontender, non-distended  MS: No edema; No deformity. Neuro:  Nonfocal  Psych: Normal affect   Labs    Chemistry Recent Labs  Lab 06/07/18 0454 06/08/18 0703 06/09/18 0425  NA 141 140 141  K 4.2 3.2* 3.4*  CL 108 105 108  CO2 22 23 24   GLUCOSE 140* 103* 89  BUN 29* 23 22  CREATININE 1.91* 1.48* 1.74*  CALCIUM 8.9 8.3* 8.4*  GFRNONAA 25*  35* 28*  GFRAA 29* 40* 33*  ANIONGAP 11 12 9      Hematology Recent Labs  Lab 06/07/18 0454 06/08/18 0703  WBC 5.8 5.7  RBC 4.29 3.93  HGB 9.6* 8.8*  HCT 31.8* 28.2*  MCV 74.1* 71.8*  MCH 22.4* 22.4*  MCHC 30.2 31.2  RDW 17.2* 16.9*  PLT 215 183    Cardiac Enzymes Recent Labs  Lab 06/07/18 0454 06/07/18 0822 06/07/18 1336 06/07/18 2013  TROPONINI 0.10* 0.15* 0.40* 0.72*   No results for input(s): TROPIPOC in the last 168 hours.   BNP Recent Labs  Lab 06/07/18 0454  BNP 1,111.3*     DDimer No results for input(s): DDIMER in the last 168 hours.   Radiology    No results found.  Cardiac Studies   TTE 06/07/18 - Left ventricle: The cavity size was severely dilated. There was   moderate concentric hypertrophy. Systolic function was severely   reduced. The estimated ejection fraction was in the range of 20%   to 25%. Severe global hypokinesis with inferior, apical and   lateral akinesis. The study is not technically sufficient to  allow evaluation of LV diastolic function. - Mitral valve: There was moderate to severe posteriorly directed   regurgitation. Valve area by continuity equation (using LVOT   flow): 2.01 cm^2. - Left atrium: Severely dilated. - Right ventricle: The cavity size was mildly dilated. Moderately   reduced systolic function. - Tricuspid valve: There was moderate regurgitation. - Pulmonary arteries: PA peak pressure: 41 mm Hg (S). - Systemic veins: The IVC measures <2.1 cm, but does not collapse   >50%, suggesting an elevated RA pressure of 8 mmHg. - Pericardium, extracardiac: A trivial pericardial effusion was   identified posterior to the heart.  Patient Profile     72 y.o. female with a history of NICM, HTN, HLD, CKD III, VT who presented to the hospital in [place]    1. VT: currently on amiodarone and no further VT. Stephanie Yates plan to have her follow up as outpatient with Dr. Caryl Yates. Currently on PO, plan to discharge on  200 mg BID.  2. Chronic systolic heart failure: mildly SOB today. Agree with resuming home lasix which should be helpful. No other changes.  4. Paroxysmal atrial fibrillation: CHADS2VASC 4. Continue eliquis. [facility] Stephanie Yates sign off.   Medication Recommendations:  Amiodarone 200 mg BID Other recommendations (labs, testing, etc):  none Follow up as an outpatient:  With Stephanie Yates (arranged)  For questions or updates, please contact [facility] Please consult [email or web address] for contact info under        Signed, Stephanie Sek Meredith Leeds, MD  06/09/2018, 9:48 AM

## 2018-06-09 NOTE — Progress Notes (Signed)
Nutrition Brief Note  Patient identified on the Malnutrition Screening Tool (MST) Report  Wt Readings from Last 15 Encounters:  06/09/18 60 kg  02/20/18 63.6 kg  08/24/17 66.2 kg  07/21/17 65.8 kg  07/18/17 65.8 kg  06/15/17 65.7 kg  03/13/17 67.1 kg  02/03/17 63.4 kg  11/15/16 70.8 kg  08/11/16 81.6 kg  07/12/16 81.6 kg  07/07/16 81.6 kg  03/16/16 67.6 kg  06/02/15 71.2 kg  08/29/14 70.3 kg    Body mass index is 21.34 kg/m. Patient meets criteria for normal weight based on current BMI. Per review, current weight is 132 lb and weight on 8/27 was 140 lb indicating 8 lb weight loss (5.7% body weight) in the past 4 months; not significant for time frame. In addition, patient weighed 146 lb on 2/28 indicating 14 lb weight loss (9.6% body weight) in the past 10 months; not significant for time frame.  Current diet order is Heart Healthy. Patient consumed a total of 1510 kcal, 63 grams of protein yesterday and for breakfast this AM he consumed 462 kcal, 14 grams of protein. Ensure Enlive ordered BID per ONS protocol to start this AM; each supplement provides 350 kcal, 20 grams of protein. Labs and medications reviewed.   Discharge order and summary in from earlier this AM. No nutrition interventions warranted at this time. If nutrition issues arise, please consult RD.     Jarome Matin, MS, RD, LDN, [facility] Inpatient Clinical Dietitian Pager # [phone] After hours/weekend pager # [phone]

## 2018-06-10 DIAGNOSIS — I4891 Unspecified atrial fibrillation: Secondary | ICD-10-CM

## 2018-06-10 LAB — BASIC METABOLIC PANEL
Anion gap: 9 (ref 5–15)
BUN: 19 mg/dL (ref 8–23)
CHLORIDE: 107 mmol/L (ref 98–111)
CO2: 25 mmol/L (ref 22–32)
CREATININE: 1.57 mg/dL — AB (ref 0.44–1.00)
Calcium: 8.7 mg/dL — ABNORMAL LOW (ref 8.9–10.3)
GFR calc Af Amer: 37 mL/min — ABNORMAL LOW (ref >60)
GFR calc non Af Amer: 32 mL/min — ABNORMAL LOW (ref >60)
Glucose, Bld: 94 mg/dL (ref 70–99)
Potassium: 4.1 mmol/L (ref 3.5–5.1)
Sodium: 141 mmol/L (ref 135–145)

## 2018-06-10 NOTE — Progress Notes (Signed)
PROGRESS NOTE    Stephanie Yates  HER:[id number]  DOB: [birth year]  DOA: 06/07/2018 PCP: Josetta Huddle, MD  Brief Narrative:   72 y/o female with history of paroxysmal atrial fibrillation, tachybrady syndrome status post AICD, hypertension, hyperlipidemia, chronic kidney disease stage III, chronic combined systolic and diastolic CHF with EF of 20 to 25% who is on chronic diuretics/AV blockers/anticoagulation at baseline presented with chest pain associated with dyspnea, diaphoresis and nausea.  Upon arrival in the ED patient noted to be tachycardic with heart rate in 150s and associated hypotension with systolic blood pressure in 80s.  EKG showed wide-complex tachycardia.  Troponin peak 2.4.  Chest x-ray showed cardiomegaly and pulmonary edema with BNP greater than 1000.  Cardioversion planned in the ED initially by cardiology but patient spontaneously converted to sinus rhythm.  AICD interrogated in the ED with?  No significant arrhythmias  Subjective:   Discharge planned but patient continues to complain of shortness of breath.  On oxygen by nasal cannula.  Saturation okay.  Poor mobility tolerance?  Denies having any chest pain at this time.  Objective: Vitals:   06/09/18 2028 06/09/18 2032 06/10/18 0358 06/10/18 1035  BP: (!) 189/53 118/89 119/65 114/69  Pulse: 61 99 (!) 50 (!) 50  Resp:   12   Temp: 97.8 F (36.6 C) 98.6 F (37 C) 98.2 F (36.8 C)   TempSrc: Oral Oral Oral   SpO2: 95% 93% 100%   Weight:   61 kg   Height:        Intake/Output Summary (Last 24 hours) at 06/10/2018 1058 Last data filed at 06/10/2018 0300 Gross per 24 hour  Intake 660 ml  Output 1000 ml  Net -340 ml   Filed Weights   06/08/18 0524 06/09/18 0524 06/10/18 0358  Weight: 61.1 kg 60 kg 61 kg    Physical Examination:  General exam: Appears calm and comfortable  Respiratory system: Clear to auscultation. Respiratory effort normal. Cardiovascular system: S1 & S2 heard, RRR. No JVD, murmurs,  rubs, gallops or clicks. No pedal edema. Gastrointestinal system: Abdomen is nondistended, soft and nontender. No organomegaly or masses felt. Normal bowel sounds heard. Central nervous system: Alert and oriented. No focal neurological deficits. Extremities: Symmetric 5 x 5 power. Skin: No rashes, lesions or ulcers Psychiatry: Judgement and insight appear normal. Mood & affect appropriate.     Data Reviewed: I have personally reviewed following labs and imaging studies  CBC: Recent Labs  Lab 06/07/18 0454 06/08/18 0703 06/09/18 0944  WBC 5.8 5.7  --   HGB 9.6* 8.8* 9.5*  HCT 31.8* 28.2* 31.5*  MCV 74.1* 71.8*  --   PLT 215 183  --    Basic Metabolic Panel: Recent Labs  Lab 06/07/18 0454 06/08/18 0703 06/09/18 0425 06/09/18 0944 06/09/18 1451 06/10/18 0310  NA 141 140 141  --   --  141  K 4.2 3.2* 3.4* 3.2* 4.1 4.1  CL 108 105 108  --   --  107  CO2 22 23 24   --   --  25  GLUCOSE 140* 103* 89  --   --  94  BUN 29* 23 22  --   --  19  CREATININE 1.91* 1.48* 1.74*  --   --  1.57*  CALCIUM 8.9 8.3* 8.4*  --   --  8.7*  MG 1.9  --   --   --   --   --    GFR: Estimated Creatinine Clearance: 29.4 mL/min (A) (  by C-G formula based on SCr of 1.57 mg/dL (H)). Liver Function Tests: No results for input(s): AST, ALT, ALKPHOS, BILITOT, PROT, ALBUMIN in the last 168 hours. No results for input(s): LIPASE, AMYLASE in the last 168 hours. No results for input(s): AMMONIA in the last 168 hours. Coagulation Profile: No results for input(s): INR, PROTIME in the last 168 hours. Cardiac Enzymes: Recent Labs  Lab 06/07/18 0454 06/07/18 0822 06/07/18 1336 06/07/18 2013  TROPONINI 0.10* 0.15* 0.40* 0.72*   BNP (last 3 results) No results for input(s): PROBNP in the last 8760 hours. HbA1C: Recent Labs    06/08/18 0703  HGBA1C 5.4   CBG: No results for input(s): GLUCAP in the last 168 hours. Lipid Profile: No results for input(s): CHOL, HDL, LDLCALC, TRIG, CHOLHDL, LDLDIRECT  in the last 72 hours. Thyroid Function Tests: No results for input(s): TSH, T4TOTAL, FREET4, T3FREE, THYROIDAB in the last 72 hours. Anemia Panel: No results for input(s): VITAMINB12, FOLATE, FERRITIN, TIBC, IRON, RETICCTPCT in the last 72 hours. Sepsis Labs: No results for input(s): PROCALCITON, LATICACIDVEN in the last 168 hours.  No results found for this or any previous visit (from the past 240 hour(s)).    Radiology Studies: No results found.      Scheduled Meds: . amiodarone  200 mg Oral BID  . apixaban  5 mg Oral BID  . atorvastatin  40 mg Oral q1800  . carvedilol  12.5 mg Oral BID WC  . cholecalciferol  1,000 Units Oral QHS  . feeding supplement (ENSURE ENLIVE)  237 mL Oral BID BM  . furosemide  40 mg Intravenous Q12H  . pantoprazole  40 mg Oral BID AC   Continuous Infusions: . sodium chloride 500 mL (06/07/18 1158)    Assessment & Plan:    1.  Wide-complex tachycardia: Ventricular tachycardia versus A. fib with RVR/aberrancy.  Appreciate cardiology evaluation.  Patient received IV lidocaine in the ED and converted to sinus rhythm.  Cardiology started patient on amiodarone drip.  Continue anticoagulation and Coreg.  Per daughter patient due for AICD change in 2020. 06/10/18: -Medications changed to p.o.  On amiodarone, Coreg.  At times heart rate is low but overall stable.  2.  Acute on chronic combined systolic/diastolic CHF with EF 20 to 25%: Admission chest x-ray showed pulmonary edema with elevated BNP.  Will resume IV Lasix for another day and monitor daily weights/I's and O's 06/10/18: -On Lasix 40 mg IV twice daily.  She was given extra dose of IV Lasix yesterday. -Continues to complain of some shortness of breath but oxygen saturation okay.  Continue Lasix for now.  Monitor BUN/creatinine closely while on IV Lasix.  She will need to be discharged on p.o. Lasix.  3.  Chronic kidney disease stage III: Baseline creatinine around 1.8-2.  Watch on  diuretics.  4.  Hyperlipidemia: Continue statins  5.  Hypertension: Patient was hypotensive in the setting of tachycardia upon presentation.  Currently heart rate and blood pressure improved.  Resume Coreg with holding parameters. 06/10/18: -At times heart rate low but overall improved compared to previous.  4.  Hypo-kalemia: Replace with goal to be above 4 and magnesium greater than 2.  Patient reports being on potassium supplements twice daily as outpatient previously but apparently was told to discontinue as she had hyperkalemia at some point.  She will likely need 20 mEq potassium daily upon discharge to take with Lasix 80 mg p.o. with close outpatient BMP follow-up. Family aware of this.  6.  Chronic  anemia: Patient's baseline hemoglobin around 9.5.  Labs today show hemoglobin at 8.8 likely dilutional component.  No signs of bleeding.  Continue anticoagulation and monitor. 06/10/18: -Hemoglobin today 9.5.  Debility/poor mobility tolerance: -Consulted physical therapy.  DVT prophylaxis: On anticoagulation with Eliquis. Code Status: Full code Family / Patient Communication: Discussed with patient, no family at bedside. Disposition Plan: Home    LOS: 2 days    Time spent: 25 minutes    Yaakov Guthrie, MD [facility] Pager on [place]  If 7PM-7AM, please contact night-coverage [email or web address] Password [email or web address] 06/10/2018, 10:58 AM

## 2018-06-10 NOTE — Progress Notes (Signed)
Pt ambulated 80 ft on RA, handheld assist, slow, mostly steady gait, tolerated fairly well. Pt c/o DOE, fatigue with distance. Sats 97-99% on RA with ambulation.   Lenna Sciara, RN, BSN

## 2018-06-11 DIAGNOSIS — R001 Bradycardia, unspecified: Secondary | ICD-10-CM

## 2018-06-11 LAB — CBC
HCT: 30.3 % — ABNORMAL LOW (ref 36.0–46.0)
Hemoglobin: 9.5 g/dL — ABNORMAL LOW (ref 12.0–15.0)
MCH: 22.4 pg — ABNORMAL LOW (ref 26.0–34.0)
MCHC: 31.4 g/dL (ref 30.0–36.0)
MCV: 71.3 fL — ABNORMAL LOW (ref 80.0–100.0)
NRBC: 0 % (ref 0.0–0.2)
PLATELETS: 213 10*3/uL (ref 150–400)
RBC: 4.25 MIL/uL (ref 3.87–5.11)
RDW: 16.9 % — ABNORMAL HIGH (ref 11.5–15.5)
WBC: 7.4 10*3/uL (ref 4.0–10.5)

## 2018-06-11 LAB — BASIC METABOLIC PANEL
Anion gap: 10 (ref 5–15)
BUN: 22 mg/dL (ref 8–23)
CO2: 25 mmol/L (ref 22–32)
Calcium: 8.9 mg/dL (ref 8.9–10.3)
Chloride: 104 mmol/L (ref 98–111)
Creatinine, Ser: 1.73 mg/dL — ABNORMAL HIGH (ref 0.44–1.00)
GFR calc non Af Amer: 29 mL/min — ABNORMAL LOW (ref >60)
GFR, EST AFRICAN AMERICAN: 33 mL/min — AB (ref >60)
Glucose, Bld: 88 mg/dL (ref 70–99)
Potassium: 4.3 mmol/L (ref 3.5–5.1)
SODIUM: 139 mmol/L (ref 135–145)

## 2018-06-11 LAB — MAGNESIUM: Magnesium: 1.9 mg/dL (ref 1.7–2.4)

## 2018-06-11 MED ORDER — ALUM & MAG HYDROXIDE-SIMETH 200-200-20 MG/5ML PO SUSP
30.0000 mL | Freq: Once | ORAL | Status: AC
  Administered 2018-06-11: 30 mL via ORAL
  Filled 2018-06-11: qty 30

## 2018-06-11 MED ORDER — CARVEDILOL 3.125 MG PO TABS: 3.1250 mg | ORAL_TABLET | Freq: Two times a day (BID) | ORAL | Status: DC

## 2018-06-11 MED ORDER — LIDOCAINE VISCOUS HCL 2 % MT SOLN
15.0000 mL | Freq: Once | OROMUCOSAL | Status: AC
  Administered 2018-06-11: 15 mL via ORAL
  Filled 2018-06-11: qty 15

## 2018-06-11 NOTE — Care Management Note (Signed)
Case Management Note  Patient Details  Name: Stephanie Yates MRN: [id number] Date of Birth: March 16, 1946  Subjective/Objective:   Wide complex tachycardia, CHF, CKD                Action/Plan: Spoke to pt and offered choice for HH/CMS list provided/placed on chart. Pt declines HH at this time. States she is wanting to go back to work and plans to start after the holiday. She does not need any DME to ambulate. Her dtr provided transportation.   Expected Discharge Date:  06/09/18               Expected Discharge Plan:  Home/Self Care  In-House Referral:  NA  Discharge planning Services  CM Consult  Post Acute Care Choice:  Home Health Choice offered to:  Patient  DME Arranged:  N/A DME Agency:  NA  HH Arranged:  Patient Refused [place] Agency:  NA  Status of Service:  Completed, signed off  If discussed at [place] of Stay Meetings, dates discussed:    Additional Comments:  Erenest Rasher, RN 06/11/2018, 11:43 AM

## 2018-06-11 NOTE — Care Management Important Message (Signed)
Important Message  Patient Details  Name: Stephanie Yates MRN: [id number] Date of Birth: May 23, 1946   Medicare Important Message Given:  Yes    Erenest Rasher, RN 06/11/2018, 11:26 AM

## 2018-06-11 NOTE — Evaluation (Signed)
Physical Therapy Evaluation Patient Details Name: ELFIDA SHIMADA MRN: [id number] DOB: 1946/04/02 Today's Date: 06/11/2018   History of Present Illness  Pt is a 72 y.o. female admitted for chest pain, dyspnea and lightheadedness, found to have acute on chronic CHF exacerbation with noted vtach and hypotension in ED. CXR interstitial pulmonary edema, stable cardiomegaly. PMH afib on amiodarone, HLD, HTN, CKDIII  Clinical Impression  Pt admitted with above diagnosis. Pt presents with generalized weakness and emergent awareness of deficits limiting safe and independent transfers, amb and stair navigation.  Pt performed 4 sit to stands with min G during session, noted increased time to stand, required RW to power trunk to stand. Pt amb 80 feet at with RW and min A for stability due to 4-5 episodes of knee buckle R>L. Pt amb at .50 feet/sec, indicative of increased risk of falls. PTA pt working and independent, now requires assist with mobility. Pt will benefit from skilled PT to increase their independence and safety with mobility to allow discharge to home with [facility] PT only if family member is able to provide 24 hour care. If not able to provide 24 hr care, pt would benefit from discharge to SNF.    Follow Up Recommendations Home health PT;Supervision/Assistance - 24 hour    Equipment Recommendations  Rolling walker with 5" wheels    Recommendations for Other Services       Precautions / Restrictions Precautions Precautions: Fall Restrictions Weight Bearing Restrictions: No      Mobility  Bed Mobility               General bed mobility comments: Pt found at EOB, returned EOB to eat.  Transfers Overall transfer level: Needs assistance Equipment used: Rolling walker (2 wheeled) Transfers: Sit to/from Stand Sit to Stand: Min guard         General transfer comment: 4 sit to stands throughout session, pt pushed from bed, takes increase time to stand, use RW to power trunk  upright  Ambulation/Gait Ambulation/Gait assistance: Min assist Gait Distance (Feet): 80 Feet Assistive device: Rolling walker (2 wheeled) Gait Pattern/deviations: Step-through pattern;Narrow base of support;Trunk flexed Gait velocity: .50 ft/sec Gait velocity interpretation: <1.31 ft/sec, indicative of household ambulator General Gait Details: Pt had notable knee buckling R>L 4-5 times. Min A for stability and support during buckling. Pt stated her knee felt like it needed to pop, when asked if she thought it was it was weak she was hesistant to admit it. VC to widen BOS after pt began to walk in tandem/narrow BOS, perhaps in an attempt to stabilize R knee with L LE.   Stairs            Wheelchair Mobility    Modified Rankin (Stroke Patients Only)       Balance Overall balance assessment: Needs assistance Sitting-balance support: Single extremity supported;Feet supported Sitting balance-Leahy Scale: Good     Standing balance support: Bilateral upper extremity supported;During functional activity Standing balance-Leahy Scale: Poor                               Pertinent Vitals/Pain Pain Assessment: No/denies pain    Home Living Family/patient expects to be discharged to:: Private residence Living Arrangements: Alone Available Help at Discharge: Family;Available PRN/intermittently(daughter) Type of Home: House Home Access: Stairs to enter Entrance Stairs-Rails: Right Entrance Stairs-Number of Steps: 3 Home Layout: One level Home Equipment: Cane - single point  Prior Function Level of Independence: Independent         Comments: Works in a cafeteria at a school     Journalist, newspaper        Extremity/Trunk Assessment   Upper Extremity Assessment Upper Extremity Assessment: Overall WFL for tasks assessed    Lower Extremity Assessment Lower Extremity Assessment: Generalized weakness       Communication   Communication: No difficulties   Cognition Arousal/Alertness: Awake/alert Behavior During Therapy: WFL for tasks assessed/performed Overall Cognitive Status: Impaired/Different from baseline Area of Impairment: Awareness;Safety/judgement                         Safety/Judgement: Decreased awareness of safety;Decreased awareness of deficits Awareness: Emergent   General Comments: A&Ox3, pt at first stated the year was 1920. Aware she was in the hospital because she had chest pain.       General Comments      Exercises     Assessment/Plan    PT Assessment Patient needs continued PT services  PT Problem List Decreased strength;Decreased mobility;Decreased safety awareness;Decreased activity tolerance;Decreased balance       PT Treatment Interventions Therapeutic activities;Therapeutic exercise;Gait training;Patient/family education;Stair training;Balance training;Functional mobility training;Neuromuscular re-education;DME instruction    PT Goals (Current goals can be found in the Care Plan section)  Acute Rehab PT Goals Patient Stated Goal: go home PT Goal Formulation: With patient Time For Goal Achievement: 06/25/18 Potential to Achieve Goals: Good    Frequency Min 3X/week   Barriers to discharge        Co-evaluation               AM-PAC PT "6 Clicks" Mobility  Outcome Measure Help needed turning from your back to your side while in a flat bed without using bedrails?: None Help needed moving from lying on your back to sitting on the side of a flat bed without using bedrails?: A Little Help needed moving to and from a bed to a chair (including a wheelchair)?: A Little Help needed standing up from a chair using your arms (e.g., wheelchair or bedside chair)?: A Little Help needed to walk in hospital room?: A Little Help needed climbing 3-5 steps with a railing? : A Lot 6 Click Score: 18    End of Session Equipment Utilized During Treatment: Gait belt Activity Tolerance: Patient  tolerated treatment well Patient left: in bed;with call bell/phone within reach Nurse Communication: Mobility status;Other (comment)(discharge recommendation) PT Visit Diagnosis: Unsteadiness on feet (R26.81);Muscle weakness (generalized) (M62.81)    Time: 0349-1791 PT Time Calculation (min) (ACUTE ONLY): 30 min   Charges:   PT Evaluation $PT Eval Moderate Complexity: 1 Mod PT Treatments $Gait Training: 8-22 mins        Gilda Crease, SPT Acute Rehab Services [place] 06/11/2018, 1:15 PM

## 2018-06-11 NOTE — Progress Notes (Signed)
PROGRESS NOTE  Stephanie Yates WUJ:[id number] DOB: [birth year] DOA: 06/07/2018 PCP: Josetta Huddle, MD  HPI/Recap of past 24 hours: 72 y/o female with history of paroxysmal atrial fibrillation, tachybrady syndrome status post AICD, hypertension, hyperlipidemia, chronic kidney disease stage III, chronic combined systolic and diastolic CHF with EF of 20 to 25% who is on chronic diuretics/AV blockers/anticoagulation at baseline presented with chest pain associated with dyspnea, diaphoresis and nausea.  Upon arrival in the ED patient noted to be tachycardic with heart rate in 150s and associated hypotension with systolic blood pressure in 80s.  EKG showed wide-complex tachycardia.  Troponin peak 2.4.  Chest x-ray showed cardiomegaly and pulmonary edema with BNP greater than 1000.  Cardioversion planned in the ED initially by cardiology but patient spontaneously converted to sinus rhythm.  AICD interrogated in the ED with?  No significant arrhythmias.  Initially plan was for patient to be discharged on 12/14, but then she became quite nauseated and unsteady with ambulation.  She has been persistently bradycardic for the last few days.  She seems to do okay laying down, then likely becomes a little bit orthostatic.  Is also compounded whenever she takes certain medications such as medication for pain.  Patient had similar symptoms again today, initially feeling well, but then when she took a pain pill, she became quite nauseated and heart rate which was already at 50 started dropping into the 40s.  Assessment/Plan: Principal Problem:   Acute on chronic combined systolic and diastolic heart failure (HCC) Active Problems:   Ventricular tachycardia (HCC)   Hypertension   Elevated troponin   PAF (paroxysmal atrial fibrillation) ([place])   ICD (implantable cardioverter-defibrillator), dual, [facility]   HLD (hyperlipidemia)   CKD (chronic kidney disease), stage III (HCC)   Chest pain   Anemia   Rapid atrial  fibrillation ([place]) 1.  Wide-complex tachycardia: Ventricular tachycardia versus A. fib with RVR/aberrancy.  Appreciate cardiology evaluation.  Patient received IV lidocaine in the ED and converted to sinus rhythm.  Cardiology started patient on amiodarone drip, and when patient converted, changed over to p.o. amiodarone and continued on her home dose of Coreg.  Now with some bradycardia.  See below..    2.  Acute on chronic combined systolic/diastolic CHF with EF 20 to 25%: Admission chest x-ray showed pulmonary edema with elevated BNP.  Will resume IV Lasix for another day and monitor daily weights/I's and O's.  BUN and creatinine remained stable   3.    chronic kidney disease stage III: Baseline creatinine around 1.8-2.  Watch on diuretics.  Creatinine today at 1.73 but with a GFR of 33, continue to monitor  4.  Hyperlipidemia: Continue statins  Hypertension: Patient was hypotensive in the setting of tachycardia upon presentation.  Currently heart rate and blood pressure improved.    See below.  Hypo-kalemia: Replace with goal to be above 4 and magnesium greater than 2.  Patient reports being on potassium supplements twice daily as outpatient previously but apparently was told to discontinue as she had hyperkalemia at some point.  She will likely need 20 mEq potassium daily upon discharge to take with Lasix 80 mg p.o. with close outpatient BMP follow-up. Family aware of this.  Chronic anemia: Patient's baseline hemoglobin around 9.5.  Labs today show hemoglobin at 8.8 likely dilutional component.  No signs of bleeding.  Continue anticoagulation and monitor.  Dizziness/bradycardia: Suspect patient is orthostatic now.  Have stopped her Coreg, currently at 12.5 p.o. twice daily.  Likely can restart  once her heart rate stays comfortably above 60 and would restart her at a lower dose, consider 3.125 p.o. twice daily.  Code Status: Full code  Family Communication: No family, lives  alone  Disposition Plan: Discharge canceled.  Home once heart rate improved   Consultants:  Cardiology  Procedures:  Echocardiogram done 12/12: Severely dilated left ventricle with EF of 20-25% with severe global hypokinesis and study insufficient to evaluate for left ventricular diastolic dysfunction.  Moderate to severe mitral regurg and moderate tricuspid regurg.  These are similar to findings from her echocardiogram in 2018.  Antimicrobials:  None  DVT prophylaxis: Eliquis   Objective: Vitals:   06/11/18 1246 06/11/18 1533  BP: 122/62 (!) 128/98  Pulse:  (!) 51  Resp:  20  Temp:  (!) 97.5 F (36.4 C)  SpO2:  100%    Intake/Output Summary (Last 24 hours) at 06/11/2018 1918 Last data filed at 06/11/2018 1700 Gross per 24 hour  Intake 900 ml  Output 1800 ml  Net -900 ml   Filed Weights   06/09/18 0524 06/10/18 0358 06/11/18 0531  Weight: 60 kg 61 kg 59.8 kg   Body mass index is 21.27 kg/m.  Exam:   General: Alert and oriented x3, no acute distress  HEENT: Normocephalic atraumatic, mucous membranes are moist  Neck: Supple, no JVD  Cardiovascular: Regular rhythm, bradycardic  Respiratory: Clear to auscultation bilaterally  Abdomen: Soft, nontender, nondistended, positive bowel sounds  Musculoskeletal: No clubbing or cyanosis, trace pitting edema  Skin: No skin breaks, tears or lesions  Psychiatry: Appropriate, no evidence of psychoses  Neuro: No focal deficits   Data Reviewed: CBC: Recent Labs  Lab 06/07/18 0454 06/08/18 0703 06/09/18 0944 06/11/18 0315  WBC 5.8 5.7  --  7.4  HGB 9.6* 8.8* 9.5* 9.5*  HCT 31.8* 28.2* 31.5* 30.3*  MCV 74.1* 71.8*  --  71.3*  PLT 215 183  --  194   Basic Metabolic Panel: Recent Labs  Lab 06/07/18 0454 06/08/18 0703 06/09/18 0425 06/09/18 0944 06/09/18 1451 06/10/18 0310 06/11/18 0315  NA 141 140 141  --   --  141 139  K 4.2 3.2* 3.4* 3.2* 4.1 4.1 4.3  CL 108 105 108  --   --  107 104  CO2 22  23 24   --   --  25 25  GLUCOSE 140* 103* 89  --   --  94 88  BUN 29* 23 22  --   --  19 22  CREATININE 1.91* 1.48* 1.74*  --   --  1.57* 1.73*  CALCIUM 8.9 8.3* 8.4*  --   --  8.7* 8.9  MG 1.9  --   --   --   --   --  1.9   GFR: Estimated Creatinine Clearance: 26.7 mL/min (A) (by C-G formula based on SCr of 1.73 mg/dL (H)). Liver Function Tests: No results for input(s): AST, ALT, ALKPHOS, BILITOT, PROT, ALBUMIN in the last 168 hours. No results for input(s): LIPASE, AMYLASE in the last 168 hours. No results for input(s): AMMONIA in the last 168 hours. Coagulation Profile: No results for input(s): INR, PROTIME in the last 168 hours. Cardiac Enzymes: Recent Labs  Lab 06/07/18 0454 06/07/18 0822 06/07/18 1336 06/07/18 2013  TROPONINI 0.10* 0.15* 0.40* 0.72*   BNP (last 3 results) No results for input(s): PROBNP in the last 8760 hours. HbA1C: No results for input(s): HGBA1C in the last 72 hours. CBG: No results for input(s): GLUCAP in the last  168 hours. Lipid Profile: No results for input(s): CHOL, HDL, LDLCALC, TRIG, CHOLHDL, LDLDIRECT in the last 72 hours. Thyroid Function Tests: No results for input(s): TSH, T4TOTAL, FREET4, T3FREE, THYROIDAB in the last 72 hours. Anemia Panel: No results for input(s): VITAMINB12, FOLATE, FERRITIN, TIBC, IRON, RETICCTPCT in the last 72 hours. Urine analysis:    Component Value Date/Time   COLORURINE YELLOW 08/29/2014 2035   APPEARANCEUR CLOUDY (A) 08/29/2014 2035   LABSPEC 1.017 08/29/2014 2035   PHURINE 6.0 08/29/2014 2035   GLUCOSEU NEGATIVE 08/29/2014 2035   HGBUR SMALL (A) 08/29/2014 2035   BILIRUBINUR NEGATIVE 08/29/2014 2035   KETONESUR NEGATIVE 08/29/2014 2035   PROTEINUR NEGATIVE 08/29/2014 2035   UROBILINOGEN 1.0 08/29/2014 2035   NITRITE NEGATIVE 08/29/2014 2035   LEUKOCYTESUR TRACE (A) 08/29/2014 2035   Sepsis Labs: @LABRCNTIP (procalcitonin:4,lacticidven:4)  )No results found for this or any previous visit (from the  past 240 hour(s)).    Studies: No results found.  Scheduled Meds: . amiodarone  200 mg Oral BID  . apixaban  5 mg Oral BID  . atorvastatin  40 mg Oral q1800  . [START ON 06/12/2018] carvedilol  3.125 mg Oral BID WC  . cholecalciferol  1,000 Units Oral QHS  . feeding supplement (ENSURE ENLIVE)  237 mL Oral BID BM  . furosemide  40 mg Intravenous Q12H  . pantoprazole  40 mg Oral BID AC    Continuous Infusions: . sodium chloride 500 mL (06/07/18 1158)     LOS: 3 days     Annita Brod, MD [facility]  To reach me or the doctor on call, go to: [email or web address] Password [facility]  06/11/2018, 7:18 PM

## 2018-06-11 NOTE — Progress Notes (Addendum)
Pt had c/o nausea and had been vomiting . Dr. [place] Pink updated . Discharge cancelled as per MD.Pt also stated that she is not ready to go home as she had been sick in her stomach since the pain pill was given.

## 2018-06-12 LAB — BASIC METABOLIC PANEL
Anion gap: 11 (ref 5–15)
BUN: 25 mg/dL — ABNORMAL HIGH (ref 8–23)
CALCIUM: 8.8 mg/dL — AB (ref 8.9–10.3)
CO2: 27 mmol/L (ref 22–32)
Chloride: 100 mmol/L (ref 98–111)
Creatinine, Ser: 1.86 mg/dL — ABNORMAL HIGH (ref 0.44–1.00)
GFR calc Af Amer: 30 mL/min — ABNORMAL LOW (ref >60)
GFR calc non Af Amer: 26 mL/min — ABNORMAL LOW (ref >60)
Glucose, Bld: 82 mg/dL (ref 70–99)
Potassium: 3.8 mmol/L (ref 3.5–5.1)
SODIUM: 138 mmol/L (ref 135–145)

## 2018-06-12 LAB — CBC
HCT: 29.3 % — ABNORMAL LOW (ref 36.0–46.0)
Hemoglobin: 9.2 g/dL — ABNORMAL LOW (ref 12.0–15.0)
MCH: 22.2 pg — ABNORMAL LOW (ref 26.0–34.0)
MCHC: 31.4 g/dL (ref 30.0–36.0)
MCV: 70.8 fL — ABNORMAL LOW (ref 80.0–100.0)
Platelets: 212 10*3/uL (ref 150–400)
RBC: 4.14 MIL/uL (ref 3.87–5.11)
RDW: 17 % — ABNORMAL HIGH (ref 11.5–15.5)
WBC: 6.3 10*3/uL (ref 4.0–10.5)
nRBC: 0 % (ref 0.0–0.2)

## 2018-06-12 MED ORDER — FUROSEMIDE 80 MG PO TABS
80.0000 mg | ORAL_TABLET | Freq: Every day | ORAL | Status: DC
  Administered 2018-06-13: 80 mg via ORAL
  Filled 2018-06-12: qty 1

## 2018-06-12 MED ORDER — APIXABAN 2.5 MG PO TABS
2.5000 mg | ORAL_TABLET | Freq: Two times a day (BID) | ORAL | Status: DC
  Administered 2018-06-12 – 2018-06-13 (×3): 2.5 mg via ORAL
  Filled 2018-06-12 (×3): qty 1

## 2018-06-12 NOTE — Progress Notes (Addendum)
PROGRESS NOTE    ILYNN Yates  VZD:[id number] DOB: 06-04-1946 DOA: 06/07/2018 PCP: Stephanie Huddle, MD   Brief Narrative: 72 y/o female with history of paroxysmal atrial fibrillation, tachybrady syndrome status post AICD, hypertension, hyperlipidemia, chronic kidney disease stage III, chronic combined systolic and diastolic CHF with EF of 20 to 25% who is on chronic diuretics/AV blockers/anticoagulation at baseline presented with chest pain associated with dyspnea, diaphoresis and nausea. Upon arrival in the ED patient noted to be tachycardic with heart rate in 150s and associated hypotension with systolic blood pressure in 80s. EKG showed wide-complex tachycardia. Troponin peak 2.4. Chest x-ray showed cardiomegaly and pulmonary edema with BNP greater than 1000. Cardioversion planned in the ED initially by cardiology but patient spontaneously converted to sinus rhythm. AICD interrogated in the ED with no significant arrhythmias.  She was seen by EP after the admission here.  There was plan for discharge on 12/14 but she was found to be bradycardic and orthostatic so discharge held.  She still remains bradycardic today.  I have requested for EP reconsult today.  Assessment & Plan:   Principal Problem:   Acute on chronic combined systolic and diastolic heart failure (HCC) Active Problems:   Ventricular tachycardia (HCC)   Hypertension   Elevated troponin   PAF (paroxysmal atrial fibrillation) ([place])   ICD (implantable cardioverter-defibrillator), dual, [facility]   HLD (hyperlipidemia)   CKD (chronic kidney disease), stage III (HCC)   Chest pain   Anemia   Rapid atrial fibrillation (HCC)  Wide-complex tachycardia: Suspected to be ventricular tachycardia versus A. fib with RVR/aberrancy.  EP was following.  She was treated with IV lidocaine in the emergency department and she converted to sinus rhythm.  She was initially started on amiodarone drip which has been converted to oral  amiodarone Coreg on hold due to bradycardia.  Bradycardia: Heart rate consistently in the range of 50s.  EP consulted.  Coreg held.  Acute on chronic combined systolic/diastolic CHF with ejection fraction of 25%: Found to have pulmonary edema on presentation as per the chest x-ray.  Elevated BNP.  Was on Lasix IV till today.  Will change Lasix to oral at her home dose 80 mg daily.She has AICD. Echocardiogram done 12/12: Severely dilated left ventricle with EF of 20-25% with severe global hypokinesis and study insufficient to evaluate for left ventricular diastolic dysfunction.  Moderate to severe mitral regurg and moderate tricuspid regurg.  These are similar to findings from her echocardiogram in 2018.  CKD stage III: Baseline creatinine around 1.8-2.  Currently kidney function on baseline.  Hyperlipidemia: Continue statin  Hypertension: Was hypertension in the setting of tachycardia on presentation.  Currently blood pressure is much stable.  Hypokalemia: Supplemented.  Needs potassium supplements on discharge.  Chronic  anemia: Baseline hemoglobin of 9.5.  Hemoglobin on baseline at present.  No need of blood transfusion.  No signs of bleeding.  Dizziness/bradycardia: Suspected to be from orthostatic hypotension.  Orthostatic vitals checked today and they are negative.  Coreg on hold.  Deconditioning/debility: PT recommended home health PT           DVT prophylaxis: Eliquis Code Status: Full Family Communication: None present at the bedside Disposition Plan: Home after improvement in bradycardia   Consultants: EP  Procedures: None  Antimicrobials: None  Subjective: Patient seen and examined the bedside this afternoon.  Remains bradycardic.  Dizziness has improved.  Blood pressure stable.  Complains of some discomfort in her epigastric region.  Objective: Vitals:   06/11/18  1533 06/11/18 1927 06/12/18 0447 06/12/18 1326  BP: (!) 128/98 (!) 108/50 126/63 (!) 103/59    Pulse: (!) 51 (!) 50 (!) 52   Resp: 20 16 17 17   Temp: (!) 97.5 F (36.4 C) 97.8 F (36.6 C) 98 F (36.7 C)   TempSrc: Oral Oral Oral   SpO2: 100% 94% 90%   Weight:   59.6 kg   Height:        Intake/Output Summary (Last 24 hours) at 06/12/2018 1327 Last data filed at 06/12/2018 1118 Gross per 24 hour  Intake 240 ml  Output 1250 ml  Net -1010 ml   Filed Weights   06/10/18 0358 06/11/18 0531 06/12/18 0447  Weight: 61 kg 59.8 kg 59.6 kg    Examination:  General exam: Appears calm and comfortable ,Not in distress,thin built HEENT:PERRL,Oral mucosa moist, Ear/Nose normal on gross exam Respiratory system: Bilateral equal air entry, normal vesicular breath sounds, no wheezes or crackles  Cardiovascular system: Bradycardia no JVD, murmurs, rubs, gallops or clicks. No pedal edema. Gastrointestinal system: Abdomen is nondistended, soft and nontender. No organomegaly or masses felt. Normal bowel sounds heard. Central nervous system: Alert and oriented. No focal neurological deficits. Extremities: No edema, no clubbing ,no cyanosis, distal peripheral pulses palpable. Skin: No rashes, lesions or ulcers,no icterus ,no pallor    Data Reviewed: I have personally reviewed following labs and imaging studies  CBC: Recent Labs  Lab 06/07/18 0454 06/08/18 0703 06/09/18 0944 06/11/18 0315 06/12/18 0253  WBC 5.8 5.7  --  7.4 6.3  HGB 9.6* 8.8* 9.5* 9.5* 9.2*  HCT 31.8* 28.2* 31.5* 30.3* 29.3*  MCV 74.1* 71.8*  --  71.3* 70.8*  PLT 215 183  --  213 626   Basic Metabolic Panel: Recent Labs  Lab 06/07/18 0454 06/08/18 0703 06/09/18 0425 06/09/18 0944 06/09/18 1451 06/10/18 0310 06/11/18 0315 06/12/18 0253  NA 141 140 141  --   --  141 139 138  K 4.2 3.2* 3.4* 3.2* 4.1 4.1 4.3 3.8  CL 108 105 108  --   --  107 104 100  CO2 22 23 24   --   --  25 25 27   GLUCOSE 140* 103* 89  --   --  94 88 82  BUN 29* 23 22  --   --  19 22 25*  CREATININE 1.91* 1.48* 1.74*  --   --  1.57*  1.73* 1.86*  CALCIUM 8.9 8.3* 8.4*  --   --  8.7* 8.9 8.8*  MG 1.9  --   --   --   --   --  1.9  --    GFR: Estimated Creatinine Clearance: 24.8 mL/min (A) (by C-G formula based on SCr of 1.86 mg/dL (H)). Liver Function Tests: No results for input(s): AST, ALT, ALKPHOS, BILITOT, PROT, ALBUMIN in the last 168 hours. No results for input(s): LIPASE, AMYLASE in the last 168 hours. No results for input(s): AMMONIA in the last 168 hours. Coagulation Profile: No results for input(s): INR, PROTIME in the last 168 hours. Cardiac Enzymes: Recent Labs  Lab 06/07/18 0454 06/07/18 0822 06/07/18 1336 06/07/18 2013  TROPONINI 0.10* 0.15* 0.40* 0.72*   BNP (last 3 results) No results for input(s): PROBNP in the last 8760 hours. HbA1C: No results for input(s): HGBA1C in the last 72 hours. CBG: No results for input(s): GLUCAP in the last 168 hours. Lipid Profile: No results for input(s): CHOL, HDL, LDLCALC, TRIG, CHOLHDL, LDLDIRECT in the last 72 hours. Thyroid Function  Tests: No results for input(s): TSH, T4TOTAL, FREET4, T3FREE, THYROIDAB in the last 72 hours. Anemia Panel: No results for input(s): VITAMINB12, FOLATE, FERRITIN, TIBC, IRON, RETICCTPCT in the last 72 hours. Sepsis Labs: No results for input(s): PROCALCITON, LATICACIDVEN in the last 168 hours.  No results found for this or any previous visit (from the past 240 hour(s)).       Radiology Studies: No results found.      Scheduled Meds: . amiodarone  200 mg Oral BID  . apixaban  2.5 mg Oral BID  . atorvastatin  40 mg Oral q1800  . cholecalciferol  1,000 Units Oral QHS  . feeding supplement (ENSURE ENLIVE)  237 mL Oral BID BM  . [START ON 06/13/2018] furosemide  80 mg Oral Daily  . pantoprazole  40 mg Oral BID AC   Continuous Infusions: . sodium chloride 500 mL (06/07/18 1158)     LOS: 4 days    Time spent: 35 mins.More than 50% of that time was spent in counseling and/or coordination of  care.      Shelly Coss, MD [facility] Pager [phone]  If 7PM-7AM, please contact night-coverage [email or web address] Password [email or web address] 06/12/2018, 1:27 PM

## 2018-06-12 NOTE — Plan of Care (Signed)
Patient uses call light for assistance and is able to go to the bathroom without being as SOB as when she was admitted. Patient remains on RA with sats mid to upper 90's. Patient's labwork is getting a little better with Creat 1.75.

## 2018-06-12 NOTE — Progress Notes (Signed)
Electrophysiology Rounding Note  Patient Name: Stephanie Yates Date of Encounter: 06/12/2018  Electrophysiologist: Caryl Comes   Subjective   The patient is doing ok today.  Since being seen this weekend, she has had orthostatic dizziness as well as weakness and fatigue. She really wants to go home.   Inpatient Medications    Scheduled Meds: . amiodarone  200 mg Oral BID  . apixaban  2.5 mg Oral BID  . atorvastatin  40 mg Oral q1800  . cholecalciferol  1,000 Units Oral QHS  . feeding supplement (ENSURE ENLIVE)  237 mL Oral BID BM  . [START ON 06/13/2018] furosemide  80 mg Oral Daily  . pantoprazole  40 mg Oral BID AC   Continuous Infusions: . sodium chloride 500 mL (06/07/18 1158)   PRN Meds: sodium chloride, acetaminophen **OR** acetaminophen, ALPRAZolam, HYDROcodone-acetaminophen, prochlorperazine   Vital Signs    Vitals:   06/12/18 1326 06/12/18 1331 06/12/18 1333 06/12/18 1337  BP: (!) 103/59 112/83 (!) 113/56 (!) 129/57  Pulse:      Resp: 17 20 18  (!) 25  Temp: 98.1 F (36.7 C)     TempSrc: Oral     SpO2: 94%     Weight:      Height:        Intake/Output Summary (Last 24 hours) at 06/12/2018 1450 Last data filed at 06/12/2018 1300 Gross per 24 hour  Intake 120 ml  Output 1550 ml  Net -1430 ml   Filed Weights   06/10/18 0358 06/11/18 0531 06/12/18 0447  Weight: 61 kg 59.8 kg 59.6 kg    Physical Exam    GEN- The patient is elderly appearing, alert and oriented x 3 today.   Head- normocephalic, atraumatic Eyes-  Sclera clear, conjunctiva pink Ears- hearing intact Oropharynx- clear Neck- supple Lungs- Clear to ausculation bilaterally, normal work of breathing Heart- Regular rate and rhythm  GI- soft, NT, ND, + BS Extremities- no clubbing, cyanosis, or edema Skin- no rash or lesion Psych- euthymic mood, full affect Neuro- strength and sensation are intact  Labs    CBC Recent Labs    06/11/18 0315 06/12/18 0253  WBC 7.4 6.3  HGB 9.5* 9.2*    HCT 30.3* 29.3*  MCV 71.3* 70.8*  PLT 213 465   Basic Metabolic Panel Recent Labs    06/11/18 0315 06/12/18 0253  NA 139 138  K 4.3 3.8  CL 104 100  CO2 25 27  GLUCOSE 88 82  BUN 22 25*  CREATININE 1.73* 1.86*  CALCIUM 8.9 8.8*  MG 1.9  --     Telemetry    Sinus brady 50's/ atrial pacing 50 (personally reviewed)  Radiology    No results found.   Assessment & Plan    1.  Ventricular tachycardia Continue amiodarone 200mg  bid x 2 weeks then decrease to 200mg  daily No driving x 6 months Keep K>3.9, Mg >1.8  2.  Chronic systolic heart failure Stable No change required today  3.  Sinus bradycardia Pacing rate increased to 70bpm today Hopefully will help symptoms of fatigue  4.  Paroxysmal atrial fibrillation Continue Eliquis long term for CHADS2VASC of 4  Ok from our standpoint to discharge to home.   [facility] will sign off.   Medication Recommendations:  Continue amiodarone 200mg  bid x 2 weeks then decrease to 200mg  daily Other recommendations (labs, testing, etc):  none Follow up as an outpatient:  With Dr Caryl Comes (scheduled and entered in AVS)  For questions or updates,  please contact [place] Please consult [email or web address] for contact info under Cardiology/STEMI.  Signed, Chanetta Marshall, NP  06/12/2018, 2:50 PM

## 2018-06-12 NOTE — Progress Notes (Signed)
ANTICOAGULATION CONSULT NOTE - [place] for apixaban Indication: atrial fibrillation  Allergies  Allergen Reactions  . Strawberry Extract Hives, Itching, Swelling and Other (See Comments)    Hives and itching     Patient Measurements: Height: 5\' 6"  (167.6 cm) Weight: 131 lb 6.4 oz (59.6 kg) IBW/kg (Calculated) : 59.3   Vital Signs: Temp: 98 F (36.7 C) (12/17 0447) Temp Source: Oral (12/17 0447) BP: 126/63 (12/17 0447) Pulse Rate: 52 (12/17 0447)  Labs: Recent Labs    06/09/18 0944 06/10/18 0310 06/11/18 0315 06/12/18 0253  HGB 9.5*  --  9.5* 9.2*  HCT 31.5*  --  30.3* 29.3*  PLT  --   --  213 212  CREATININE  --  1.57* 1.73* 1.86*    Estimated Creatinine Clearance: 24.8 mL/min (A) (by C-G formula based on SCr of 1.86 mg/dL (H)).   Medical History: Past Medical History:  Diagnosis Date  . Chronic renal insufficiency, stage III (moderate) (HCC)    Cr 2.0 5/15  . Chronic systolic CHF (congestive heart failure) ([place])   . History of tobacco abuse   . Hyperlipidemia   . Hypertension   . Nonischemic cardiomyopathy ([place])    EF 10-15% on 11/2012 cath  . Sinus bradycardia 12/21/2012  . Ventricular tachycardia ([place]) 12/14/2012    Medications:  Medications Prior to Admission  Medication Sig Dispense Refill Last Dose  . apixaban (ELIQUIS) 5 MG TABS tablet Take 5 mg by mouth 2 (two) times daily.   06/01/2018 at 1800  . atorvastatin (LIPITOR) 40 MG tablet Take 1 tablet (40 mg total) by mouth daily at 6 PM. 30 tablet 0 06/06/2018 at Unknown time  . carvedilol (COREG) 12.5 MG tablet Take 12.5 mg by mouth 2 (two) times daily with a meal.   06/06/2018 at 1800  . Cholecalciferol (VITAMIN D-3 PO) Take 1,000 Units by mouth at bedtime.    06/06/2018 at Unknown time  . furosemide (LASIX) 40 MG tablet Take 80 mg by mouth daily.    06/06/2018 at Unknown time  . pantoprazole (PROTONIX) 40 MG tablet Take 1 tablet (40 mg total) by mouth 2 (two) times daily  before a meal. 120 tablet 0 06/06/2018 at Unknown time  . acetaminophen (TYLENOL) 325 MG tablet Take 2 tablets (650 mg total) by mouth every 6 (six) hours as needed for mild pain or moderate pain. (Patient not taking: Reported on 06/07/2018) 60 tablet 0 Not Taking at Unknown time  . Diclofenac Sodium 3 % GEL Place 1 application onto the skin 2 (two) times daily. To affected area. (Patient not taking: Reported on 06/07/2018) 100 g 0 Not Taking at Unknown time  . HYDROcodone-acetaminophen (NORCO) 5-325 MG tablet Take 1-2 tablets by mouth every 6 (six) hours as needed. (Patient not taking: Reported on 06/07/2018) 12 tablet 0 Completed Course at Unknown time   Scheduled:  . amiodarone  200 mg Oral BID  . apixaban  2.5 mg Oral BID  . atorvastatin  40 mg Oral q1800  . cholecalciferol  1,000 Units Oral QHS  . feeding supplement (ENSURE ENLIVE)  237 mL Oral BID BM  . furosemide  40 mg Intravenous Q12H  . pantoprazole  40 mg Oral BID AC    Assessment: 72 yo female here with CP (on apixaban5mg  po bid PTA for afib). Pt noted to have AKI on admit which improved. However SCr trending back up. Scr is 1.86 Therefore, will adjust apixaban back to lower dose of 2.5 mg BID  due to Scr >1.5, wt <60kg.   Goal of Therapy:  Monitor platelets by anticoagulation protocol: Yes   Plan:   -Decrease apixaban to 2.5mg  BID -Monitor renal function and for signs and symptoms of bleeding.   Nicole Cella, RPh Clinical Pharmacist [phone] Please check AMION for all [facility] numbers 06/12/2018 8:37 AM

## 2018-06-13 MED ORDER — CARVEDILOL 3.125 MG PO TABS: 3.1250 mg | ORAL_TABLET | Freq: Two times a day (BID) | ORAL | 0 refills | Status: DC

## 2018-06-13 MED ORDER — POTASSIUM CHLORIDE CRYS ER 20 MEQ PO TBCR: 20.0000 meq | EXTENDED_RELEASE_TABLET | Freq: Every day | ORAL | 0 refills | Status: DC

## 2018-06-13 MED ORDER — AMIODARONE HCL 200 MG PO TABS: 200.0000 mg | ORAL_TABLET | Freq: Two times a day (BID) | ORAL | 0 refills | Status: DC

## 2018-06-13 MED ORDER — AMIODARONE HCL 200 MG PO TABS: 200.0000 mg | ORAL_TABLET | Freq: Every day | ORAL | Status: DC

## 2018-06-13 MED ORDER — POTASSIUM CHLORIDE CRYS ER 20 MEQ PO TBCR
20.0000 meq | EXTENDED_RELEASE_TABLET | Freq: Every day | ORAL | Status: DC
  Administered 2018-06-13: 20 meq via ORAL
  Filled 2018-06-13: qty 1

## 2018-06-13 MED ORDER — FUROSEMIDE 80 MG PO TABS: 80.0000 mg | ORAL_TABLET | Freq: Every day | ORAL | 0 refills | Status: DC

## 2018-06-13 MED ORDER — AMIODARONE HCL 200 MG PO TABS: 200.0000 mg | ORAL_TABLET | Freq: Every day | ORAL | 0 refills | Status: DC

## 2018-06-13 MED ORDER — APIXABAN 2.5 MG PO TABS: 2.5000 mg | ORAL_TABLET | Freq: Two times a day (BID) | ORAL | 0 refills | Status: DC

## 2018-06-13 MED ORDER — MAGNESIUM OXIDE 400 (241.3 MG) MG PO TABS
400.0000 mg | ORAL_TABLET | Freq: Every day | ORAL | Status: DC
  Administered 2018-06-13: 400 mg via ORAL
  Filled 2018-06-13: qty 1

## 2018-06-13 MED ORDER — MAGNESIUM OXIDE 400 (241.3 MG) MG PO TABS: 400.0000 mg | ORAL_TABLET | Freq: Every day | ORAL | 0 refills | Status: DC

## 2018-06-13 NOTE — Progress Notes (Signed)
NCM spoke to pt about HHPT and RW for home. States at this time she does not need HH or RW. Explained her PCP can order [facility] and any DME at her follow up appt. Pt verbalized understanding. Jonnie Finner RN CCM Case Mgmt phone [phone]

## 2018-06-13 NOTE — Discharge Summary (Signed)
Physician Discharge Summary  Stephanie Yates RSW:[id number] DOB: 1945-07-16 DOA: 06/07/2018  PCP: Josetta Huddle, MD  Admit date: 06/07/2018 Discharge date: 06/13/2018  Admitted From: Home Disposition:  Home  Discharge Condition:Stable CODE STATUS:FULL Diet recommendation: Heart Healthy  Brief/Interim Summary: 72 y/o female with history of paroxysmal atrial fibrillation, tachybrady syndrome status post AICD, hypertension, hyperlipidemia, chronic kidney disease stage III, chronic combined systolic and diastolic CHF with EF of 20 to 25% who is on chronic diuretics/AV blockers/anticoagulation at baseline presented with chest pain associated with dyspnea, diaphoresis and nausea. Upon arrival in the ED patient noted to be tachycardic with heart rate in 150s and associated hypotension with systolic blood pressure in 80s. EKG showed wide-complex tachycardia. Troponin peak 2.4. Chest x-ray showed cardiomegaly and pulmonary edema with BNP greater than 1000. Cardioversion planned in the ED initially by cardiology but patient spontaneously converted to sinus rhythm. AICD interrogated in the ED with no significant arrhythmias.  She was seen by EP after the admission here.  There was plan for discharge on 12/14 but she was found to be bradycardic and orthostatic so discharge held.  She remained bradycardic today. Requested for EP reconsult .  Pacing rate increased to 70 bpm.  Her heart rate is currently stable.  She is stable for discharge to home today.  Orthostatic vitals were negative.   Following problems were addressed during her hospitalization:  Wide-complex tachycardia: Suspected to be ventricular tachycardia versus A. fib with RVR/aberrancy.  EP was following.  She was treated with IV lidocaine in the emergency department and she converted to sinus rhythm.  She was initially started on amiodarone drip which has been converted to oral amiodarone. Coreg dose reduced to 3.125 BID. Continue  magnesium and potassium supplementation on discharge.  She will follow-up with cardiology as an outpatient.  Bradycardia: Heart rate consistently was in the  range of 50s.  EP consulted.  Pacing rate increased to 70 bpm.  Currently heart rate in the range of 70s.  Denies any dizziness.  Acute on chronic combined systolic/diastolic CHF with ejection fraction of 25%: Found to have pulmonary edema on presentation as per the chest x-ray.  Elevated BNP.  Was on Lasix IV ,changed Lasix to oral at her home dose 80 mg daily.She has AICD. Echocardiogram done 12/12: Severely dilated left ventricle with EF of 20-25% with severe global hypokinesis and study insufficient to evaluate for left ventricular diastolic dysfunction. Moderate to severe mitral regurg and moderate tricuspid regurg. These are similar to findings from her echocardiogram in 2018.  CKD stage III: Baseline creatinine around 1.8-2.  Currently kidney function on baseline.  Hyperlipidemia: Continue statin  Hypertension: Was hypertensive in the setting of tachycardia on presentation.  Currently blood pressure is much stable.  Hypokalemia: Supplemented.  Needs potassium supplements on discharge.  Chronic  anemia: Baseline hemoglobin of 9.5.  Hemoglobin on baseline at present.  No need of blood transfusion.  No signs of bleeding.  Dizziness/bradycardia: Suspected to be from orthostatic hypotension.  Orthostatic vitals checked today and they are negative.    Deconditioning/debility: PT recommended home health PT    Discharge Diagnoses:  Principal Problem:   Acute on chronic combined systolic and diastolic heart failure (HCC) Active Problems:   Ventricular tachycardia (HCC)   Hypertension   Elevated troponin   PAF (paroxysmal atrial fibrillation) ([place])   ICD (implantable cardioverter-defibrillator), dual, [facility]   HLD (hyperlipidemia)   CKD (chronic kidney disease), stage III (HCC)   Chest pain   Anemia  Rapid atrial  fibrillation [facility])    Discharge Instructions  Discharge Instructions    Amb referral to AFIB Clinic   Complete by:  As directed    Call MD for:  extreme fatigue   Complete by:  As directed    Call MD for:  persistant dizziness or light-headedness   Complete by:  As directed    Call MD for:  severe uncontrolled pain   Complete by:  As directed    Call MD for:  temperature >100.4   Complete by:  As directed    Diet - low sodium heart healthy   Complete by:  As directed    Diet - low sodium heart healthy   Complete by:  As directed    Discharge instructions   Complete by:  As directed    1) Please follow-up with your PCP in a week.  Do a CBC, BMP,magnesium  test during the follow-up. 2)Follow up with cardiology on 1/30.  Appointment has already been scheduled. 3)Take prescribed medications as instructed.   Increase activity slowly   Complete by:  As directed    Increase activity slowly   Complete by:  As directed    Increase activity slowly   Complete by:  As directed      Allergies as of 06/13/2018      Reactions   Strawberry Extract Hives, Itching, Swelling, Other (See Comments)   Hives and itching      Medication List    STOP taking these medications   acetaminophen 325 MG tablet Commonly known as:  TYLENOL   Diclofenac Sodium 3 % Gel   HYDROcodone-acetaminophen 5-325 MG tablet Commonly known as:  NORCO     TAKE these medications   amiodarone 200 MG tablet Commonly known as:  PACERONE Take 1 tablet (200 mg total) by mouth 2 (two) times daily for 9 days.   amiodarone 200 MG tablet Commonly known as:  PACERONE Take 1 tablet (200 mg total) by mouth daily. Start taking on:  June 23, 2018   apixaban 2.5 MG Tabs tablet Commonly known as:  ELIQUIS Take 1 tablet (2.5 mg total) by mouth 2 (two) times daily. What changed:    medication strength  how much to take   atorvastatin 40 MG tablet Commonly known as:  LIPITOR Take 1 tablet (40 mg total) by  mouth daily at 6 PM.   carvedilol 3.125 MG tablet Commonly known as:  COREG Take 1 tablet (3.125 mg total) by mouth 2 (two) times daily with a meal. What changed:    medication strength  how much to take   furosemide 80 MG tablet Commonly known as:  LASIX Take 1 tablet (80 mg total) by mouth daily. Start taking on:  June 14, 2018 What changed:  medication strength   magnesium oxide 400 (241.3 Mg) MG tablet Commonly known as:  MAG-OX Take 1 tablet (400 mg total) by mouth daily. Start taking on:  June 14, 2018   pantoprazole 40 MG tablet Commonly known as:  PROTONIX Take 1 tablet (40 mg total) by mouth 2 (two) times daily before a meal.   potassium chloride SA 20 MEQ tablet Commonly known as:  K-DUR,KLOR-CON Take 1 tablet (20 mEq total) by mouth daily. Start taking on:  June 14, 2018   VITAMIN D-3 PO Take 1,000 Units by mouth at bedtime.      Follow-up Information    Deboraha Sprang, MD Follow up on 07/26/2018.   Specialty:  Cardiology Why:  at [place] information: [place] [address] [place] [address] [phone]        Josetta Huddle, MD. Schedule an appointment as soon as possible for a visit in 1 week(s).   Specialty:  Internal Medicine Contact information: [address] [place] [address] [phone]          Allergies  Allergen Reactions  . Strawberry Extract Hives, Itching, Swelling and Other (See Comments)    Hives and itching     Consultations:  Cardiology   Procedures/Studies: Dg Chest Portable 1 View  Result Date: 06/07/2018 CLINICAL DATA:  72 y/o  F; chest pain and shortness of breath. EXAM: PORTABLE CHEST 1 VIEW COMPARISON:  07/21/2017 chest radiograph FINDINGS: Stable cardiomegaly given projection and technique. Two lead AICD noted. Aortic atherosclerosis with calcification. Diffuse reticular opacities of the lungs. No consolidation, effusion, or pneumothorax. No acute osseous  abnormality is evident. IMPRESSION: Stable cardiomegaly.  Interstitial pulmonary edema. Electronically Signed   By: Kristine Garbe M.D.   On: 06/07/2018 06:07       Subjective: Patient seen and examined at bedside this afternoon.  Remains comfortable.  Hemodynamically stable.  Stable for discharge today.  Discharge Exam: Vitals:   06/13/18 0358 06/13/18 0410  BP:  123/74  Pulse:  69  Resp: 16   Temp:  98 F (36.7 C)  SpO2:  96%   Vitals:   06/12/18 1924 06/13/18 0012 06/13/18 0358 06/13/18 0410  BP: 99/60   123/74  Pulse: 69   69  Resp:  19 16   Temp: 98.2 F (36.8 C)   98 F (36.7 C)  TempSrc: Oral   Oral  SpO2: 98%   96%  Weight:    59.8 kg  Height:        General: Pt is alert, awake, not in acute distress Cardiovascular: RRR, S1/S2 +, no rubs, no gallops Respiratory: CTA bilaterally, no wheezing, no rhonchi Abdominal: Soft, NT, ND, bowel sounds + Extremities: no edema, no cyanosis    The results of significant diagnostics from this hospitalization (including imaging, microbiology, ancillary and laboratory) are listed below for reference.     Microbiology: No results found for this or any previous visit (from the past 240 hour(s)).   Labs: BNP (last 3 results) Recent Labs    06/15/17 1902 06/07/18 0454  BNP 594.9* 4,656.8*   Basic Metabolic Panel: Recent Labs  Lab 06/07/18 0454 06/08/18 0703 06/09/18 0425 06/09/18 0944 06/09/18 1451 06/10/18 0310 06/11/18 0315 06/12/18 0253  NA 141 140 141  --   --  141 139 138  K 4.2 3.2* 3.4* 3.2* 4.1 4.1 4.3 3.8  CL 108 105 108  --   --  107 104 100  CO2 22 23 24   --   --  25 25 27   GLUCOSE 140* 103* 89  --   --  94 88 82  BUN 29* 23 22  --   --  19 22 25*  CREATININE 1.91* 1.48* 1.74*  --   --  1.57* 1.73* 1.86*  CALCIUM 8.9 8.3* 8.4*  --   --  8.7* 8.9 8.8*  MG 1.9  --   --   --   --   --  1.9  --    Liver Function Tests: No results for input(s): AST, ALT, ALKPHOS, BILITOT, PROT, ALBUMIN in  the last 168 hours. No results for input(s): LIPASE, AMYLASE in the last 168 hours. No results for input(s): AMMONIA  in the last 168 hours. CBC: Recent Labs  Lab 06/07/18 0454 06/08/18 0703 06/09/18 0944 06/11/18 0315 06/12/18 0253  WBC 5.8 5.7  --  7.4 6.3  HGB 9.6* 8.8* 9.5* 9.5* 9.2*  HCT 31.8* 28.2* 31.5* 30.3* 29.3*  MCV 74.1* 71.8*  --  71.3* 70.8*  PLT 215 183  --  213 212   Cardiac Enzymes: Recent Labs  Lab 06/07/18 0454 06/07/18 0822 06/07/18 1336 06/07/18 2013  TROPONINI 0.10* 0.15* 0.40* 0.72*   BNP: Invalid input(s): POCBNP CBG: No results for input(s): GLUCAP in the last 168 hours. D-Dimer No results for input(s): DDIMER in the last 72 hours. Hgb A1c No results for input(s): HGBA1C in the last 72 hours. Lipid Profile No results for input(s): CHOL, HDL, LDLCALC, TRIG, CHOLHDL, LDLDIRECT in the last 72 hours. Thyroid function studies No results for input(s): TSH, T4TOTAL, T3FREE, THYROIDAB in the last 72 hours.  Invalid input(s): FREET3 Anemia work up No results for input(s): VITAMINB12, FOLATE, FERRITIN, TIBC, IRON, RETICCTPCT in the last 72 hours. Urinalysis    Component Value Date/Time   COLORURINE YELLOW 08/29/2014 2035   APPEARANCEUR CLOUDY (A) 08/29/2014 2035   LABSPEC 1.017 08/29/2014 2035   PHURINE 6.0 08/29/2014 2035   GLUCOSEU NEGATIVE 08/29/2014 2035   HGBUR SMALL (A) 08/29/2014 2035   BILIRUBINUR NEGATIVE 08/29/2014 2035   KETONESUR NEGATIVE 08/29/2014 2035   PROTEINUR NEGATIVE 08/29/2014 2035   UROBILINOGEN 1.0 08/29/2014 2035   NITRITE NEGATIVE 08/29/2014 2035   LEUKOCYTESUR TRACE (A) 08/29/2014 2035   Sepsis Labs Invalid input(s): PROCALCITONIN,  WBC,  LACTICIDVEN Microbiology No results found for this or any previous visit (from the past 240 hour(s)).  Please note: You were cared for by a hospitalist during your hospital stay. Once you are discharged, your primary care physician will handle any further medical issues. Please  note that NO REFILLS for any discharge medications will be authorized once you are discharged, as it is imperative that you return to your primary care physician (or establish a relationship with a primary care physician if you do not have one) for your post hospital discharge needs so that they can reassess your need for medications and monitor your lab values.    Time coordinating discharge: 40 minutes  SIGNED:   Shelly Coss, MD  [facility] 06/13/2018, 12:19 PM Pager [phone]  If 7PM-7AM, please contact night-coverage [email or web address] Password [email or web address]

## 2018-06-13 NOTE — Progress Notes (Addendum)
Physical Therapy Treatment Patient Details Name: Stephanie Yates MRN: [id number] DOB: 02-17-1946 Today's Date: 06/13/2018    History of Present Illness Pt is a 72 y.o. female admitted for chest pain, dyspnea and lightheadedness, found to have acute on chronic CHF exacerbation with noted vtach and hypotension in ED. CXR interstitial pulmonary edema, stable cardiomegaly. PMH afib on amiodarone, HLD, HTN, CKDIII    PT Comments    Pt admitted with above diagnosis. Pt currently with functional limitations due to balance and endurance deficits. Pt was able to ambulate with min assist  due to occasional LOB and pt reaching for objects like rail in hallway to stabilize.  Feel that pt will be safest with a RW at home.  Pt aware that she should use cane or RW.  Pt will benefit from skilled PT to increase their independence and safety with mobility to allow discharge to the venue listed below.     Follow Up Recommendations  Home health PT;Supervision/Assistance - 24 hour     Equipment Recommendations  Rolling walker with 5" wheels    Recommendations for Other Services       Precautions / Restrictions Precautions Precautions: Fall Restrictions Weight Bearing Restrictions: No    Mobility  Bed Mobility Overal bed mobility: Independent                Transfers Overall transfer level: Needs assistance Equipment used: Rolling walker (2 wheeled) Transfers: Sit to/from Stand Sit to Stand: Min guard         General transfer comment: takes increase time to stand but did not need assist.  Ambulation/Gait Ambulation/Gait assistance: Min assist Gait Distance (Feet): 110 Feet Assistive device: None Gait Pattern/deviations: Step-through pattern;Narrow base of support;Decreased stride length;Staggering right   Gait velocity interpretation: <1.31 ft/sec, indicative of household ambulator General Gait Details: Pt had some LOB needing steadyng assist a few times during gait.  Discussed pt  using cane or RW at home.  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  Pt aware she may need to use device. Min A for stability. Pt also reaching for rail in hallway and furniture at times. Session interrupted by lunch that had come.    Stairs             Wheelchair Mobility    Modified Rankin (Stroke Patients Only)       Balance Overall balance assessment: Needs assistance Sitting-balance support: Single extremity supported;Feet supported Sitting balance-Leahy Scale: Good     Standing balance support: Bilateral upper extremity supported;During functional activity Standing balance-Leahy Scale: Poor Standing balance comment: relies on at least 1 UE support and really does best with bil UE support.                             Cognition Arousal/Alertness: Awake/alert Behavior During Therapy: WFL for tasks assessed/performed Overall Cognitive Status: Impaired/Different from baseline Area of Impairment: Awareness;Safety/judgement                         Safety/Judgement: Decreased awareness of safety;Decreased awareness of deficits Awareness: Emergent   General Comments: A&Ox4      Exercises      General Comments        Pertinent Vitals/Pain Pain Assessment: No/denies pain    Home Living                      Prior Function  PT Goals (current goals can now be found in the care plan section) Acute Rehab PT Goals Patient Stated Goal: go home Progress towards PT goals: Progressing toward goals    Frequency    Min 3X/week      PT Plan Current plan remains appropriate    Co-evaluation              AM-PAC PT "6 Clicks" Mobility   Outcome Measure  Help needed turning from your back to your side while in a flat bed without using bedrails?: None Help needed moving from lying on your back to sitting on the side of a flat bed without using bedrails?: A Little Help needed moving to and from a bed to a chair (including a  wheelchair)?: A Little Help needed standing up from a chair using your arms (e.g., wheelchair or bedside chair)?: A Little Help needed to walk in hospital room?: A Little Help needed climbing 3-5 steps with a railing? : A Lot 6 Click Score: 18    End of Session Equipment Utilized During Treatment: Gait belt Activity Tolerance: Patient tolerated treatment well Patient left: in bed;with call bell/phone within reach(sitting EOB)   PT Visit Diagnosis: Unsteadiness on feet (R26.81);Muscle weakness (generalized) (M62.81)     Time: 1120-1130 PT Time Calculation (min) (ACUTE ONLY): 10 min  Charges:  $Gait Training: 8-22 mins                     [place] Pager:  [phone]  Office:  [place] 06/13/2018, 11:45 AM

## 2018-06-13 NOTE — Progress Notes (Signed)
MD on call notified of pt having runs of wide qrs v-tach. Pt's longest run thus far has been 16 beats & it occurred around 0016. Pt asymptomatic & resting. VS stable. Will continue to monitor pt & await any new orders. Hoover Brunette, RN

## 2018-06-28 ENCOUNTER — Encounter: Payer: Self-pay | Admitting: Internal Medicine

## 2018-07-05 ENCOUNTER — Other Ambulatory Visit: Payer: Self-pay

## 2018-07-05 MED ORDER — ATORVASTATIN CALCIUM 40 MG PO TABS: 40.0000 mg | ORAL_TABLET | Freq: Every day | ORAL | 0 refills | Status: DC

## 2018-07-05 MED ORDER — AMIODARONE HCL 200 MG PO TABS: 200.0000 mg | ORAL_TABLET | Freq: Every day | ORAL | 2 refills | Status: DC

## 2018-07-05 MED ORDER — POTASSIUM CHLORIDE CRYS ER 20 MEQ PO TBCR: 20.0000 meq | EXTENDED_RELEASE_TABLET | Freq: Every day | ORAL | 0 refills | Status: DC

## 2018-07-05 MED ORDER — CARVEDILOL 3.125 MG PO TABS: 3.1250 mg | ORAL_TABLET | Freq: Two times a day (BID) | ORAL | 2 refills | Status: DC

## 2018-07-05 MED ORDER — APIXABAN 2.5 MG PO TABS: 2.5000 mg | ORAL_TABLET | Freq: Two times a day (BID) | ORAL | 0 refills | Status: DC

## 2018-07-05 MED ORDER — FUROSEMIDE 80 MG PO TABS: 80.0000 mg | ORAL_TABLET | Freq: Every day | ORAL | 2 refills | Status: DC

## 2018-07-05 NOTE — Telephone Encounter (Signed)
Pt pharmacy is requesting a refill on atorvastatin, pantoprazole and potassium chloride. Would Dr. Caryl Comes like to refill these medications. Please address     [facility] pt needs a refill on Eliquis. Please address

## 2018-07-05 NOTE — Telephone Encounter (Signed)
Refills for Atorvastatin and K supplement sent in. Please defer refill for Protonix to PCP.

## 2018-07-05 NOTE — Telephone Encounter (Signed)
Pt last saw Dr Caryl Comes 02/20/18, last labs on 06/12/18 Creat 1.86, age 73, weight on 06/13/18 at [facility] 59.8kg and the pharmacist at hospital reduced pt's dosage of Eliquis to 2.5mg  BID.  Pt was discharged on 06/15/18 on Eliquis 2.5mg  BID.Based on pt's weight and Creat pt is on appropriate dosage of Eliquis 2.5mg  BID.  Pt is seeing Dr Caryl Comes in office on 07/26/18 will reassess weight at that time along with dosage appropriateness. Will refill rx x 30 days.

## 2018-07-13 ENCOUNTER — Telehealth: Payer: Self-pay | Admitting: Internal Medicine

## 2018-07-13 NOTE — Telephone Encounter (Signed)
Pt is a 37 yof, wt of 63.6kg, cr of 1.86 (06/12/18), last seen in the office on 02/20/18 by Dr. Caryl Comes pt request 2.5mg  eliquis but clears for 5mg  will message dr Caryl Comes

## 2018-07-16 NOTE — Telephone Encounter (Signed)
°*  STAT* If patient is at the pharmacy, call can be transferred to refill team.   1. Which medications need to be refilled? (please list name of each medication and dose if known)  Magnesium Oxide  2. Which pharmacy/location (including street and city if local pharmacy) is medication to be sent to?[facility] [place] and [place]  3. Do they need a 30 day or 90 day supply? 30 and refills

## 2018-07-17 ENCOUNTER — Other Ambulatory Visit: Payer: Self-pay

## 2018-07-17 MED ORDER — MAGNESIUM OXIDE 400 (241.3 MG) MG PO TABS: 400.0000 mg | ORAL_TABLET | Freq: Every day | ORAL | 1 refills | Status: DC

## 2018-07-23 NOTE — Telephone Encounter (Signed)
Pt on appropriate dose of Eliquis 2.5mg  BID - most recent weight is 59kg and SCr > 1.5 Will continue on current dose.

## 2018-07-23 NOTE — Telephone Encounter (Signed)
-----   Message from [place], [place] sent at 07/23/2018  3:38 PM EST ----- Regarding: RE: refill dosage  ----- Message ----- From: Deboraha Sprang, MD Sent: 07/23/2018  11:11 AM EST To: Allean Found, CMA Subject: RE: refill dosage                              AGREE WITH 5 MG  THX  ----- Message ----- From: Allean Found, CMA Sent: 07/13/2018   1:29 PM EST To: Deboraha Sprang, MD Subject: refill dosage                                  Pt is a 77 yof, wt of 63.6kg, cr of 1.86 (06/12/18), last seen in the office on 02/20/18 by Dr. Caryl Comes pt request 2.5mg  eliquis but clears for 5mg  what dosage do you prefer?

## 2018-07-26 ENCOUNTER — Encounter: Payer: Medicare Other | Admitting: Internal Medicine

## 2018-07-26 DIAGNOSIS — R0989 Other specified symptoms and signs involving the circulatory and respiratory systems: Secondary | ICD-10-CM

## 2018-07-27 ENCOUNTER — Encounter: Payer: Self-pay | Admitting: Internal Medicine

## 2018-08-07 ENCOUNTER — Other Ambulatory Visit: Payer: Self-pay | Admitting: Internal Medicine

## 2018-08-12 ENCOUNTER — Other Ambulatory Visit: Payer: Self-pay | Admitting: Internal Medicine

## 2018-09-02 ENCOUNTER — Other Ambulatory Visit: Payer: Self-pay

## 2018-09-02 ENCOUNTER — Encounter (HOSPITAL_COMMUNITY): Payer: Self-pay | Admitting: Emergency Medicine

## 2018-09-02 ENCOUNTER — Emergency Department (HOSPITAL_COMMUNITY): Payer: Medicare Other

## 2018-09-02 ENCOUNTER — Inpatient Hospital Stay (HOSPITAL_COMMUNITY)
Admission: EM | Admit: 2018-09-02 | Discharge: 2018-09-06 | DRG: 193 | Disposition: A | Discharge status: Home / Self Care | Payer: Medicare Other | Attending: Internal Medicine | Admitting: Internal Medicine

## 2018-09-02 DIAGNOSIS — Z87891 Personal history of nicotine dependence: Secondary | ICD-10-CM

## 2018-09-02 DIAGNOSIS — D509 Iron deficiency anemia, unspecified: Secondary | ICD-10-CM | POA: Diagnosis present

## 2018-09-02 DIAGNOSIS — I428 Other cardiomyopathies: Secondary | ICD-10-CM | POA: Diagnosis present

## 2018-09-02 DIAGNOSIS — J9601 Acute respiratory failure with hypoxia: Secondary | ICD-10-CM | POA: Diagnosis present

## 2018-09-02 DIAGNOSIS — N183 Chronic kidney disease, stage 3 unspecified: Secondary | ICD-10-CM

## 2018-09-02 DIAGNOSIS — Z91018 Allergy to other foods: Secondary | ICD-10-CM

## 2018-09-02 DIAGNOSIS — J810 Acute pulmonary edema: Secondary | ICD-10-CM

## 2018-09-02 DIAGNOSIS — I472 Ventricular tachycardia, unspecified: Secondary | ICD-10-CM

## 2018-09-02 DIAGNOSIS — J101 Influenza due to other identified influenza virus with other respiratory manifestations: Secondary | ICD-10-CM | POA: Diagnosis not present

## 2018-09-02 DIAGNOSIS — I447 Left bundle-branch block, unspecified: Secondary | ICD-10-CM | POA: Diagnosis present

## 2018-09-02 DIAGNOSIS — R5381 Other malaise: Secondary | ICD-10-CM | POA: Diagnosis present

## 2018-09-02 DIAGNOSIS — N179 Acute kidney failure, unspecified: Secondary | ICD-10-CM

## 2018-09-02 DIAGNOSIS — I10 Essential (primary) hypertension: Secondary | ICD-10-CM | POA: Diagnosis present

## 2018-09-02 DIAGNOSIS — E440 Moderate protein-calorie malnutrition: Secondary | ICD-10-CM | POA: Diagnosis present

## 2018-09-02 DIAGNOSIS — I130 Hypertensive heart and chronic kidney disease with heart failure and stage 1 through stage 4 chronic kidney disease, or unspecified chronic kidney disease: Secondary | ICD-10-CM | POA: Diagnosis present

## 2018-09-02 DIAGNOSIS — J111 Influenza due to unidentified influenza virus with other respiratory manifestations: Secondary | ICD-10-CM

## 2018-09-02 DIAGNOSIS — E872 Acidosis, unspecified: Secondary | ICD-10-CM | POA: Diagnosis present

## 2018-09-02 DIAGNOSIS — Z79899 Other long term (current) drug therapy: Secondary | ICD-10-CM

## 2018-09-02 DIAGNOSIS — Z6820 Body mass index (BMI) 20.0-20.9, adult: Secondary | ICD-10-CM

## 2018-09-02 DIAGNOSIS — D649 Anemia, unspecified: Secondary | ICD-10-CM | POA: Diagnosis present

## 2018-09-02 DIAGNOSIS — E785 Hyperlipidemia, unspecified: Secondary | ICD-10-CM | POA: Diagnosis present

## 2018-09-02 DIAGNOSIS — I081 Rheumatic disorders of both mitral and tricuspid valves: Secondary | ICD-10-CM | POA: Diagnosis present

## 2018-09-02 DIAGNOSIS — I480 Paroxysmal atrial fibrillation: Secondary | ICD-10-CM

## 2018-09-02 DIAGNOSIS — Z7901 Long term (current) use of anticoagulants: Secondary | ICD-10-CM

## 2018-09-02 DIAGNOSIS — E860 Dehydration: Secondary | ICD-10-CM | POA: Diagnosis not present

## 2018-09-02 DIAGNOSIS — E861 Hypovolemia: Secondary | ICD-10-CM | POA: Diagnosis not present

## 2018-09-02 DIAGNOSIS — I5042 Chronic combined systolic (congestive) and diastolic (congestive) heart failure: Secondary | ICD-10-CM

## 2018-09-02 DIAGNOSIS — D631 Anemia in chronic kidney disease: Secondary | ICD-10-CM | POA: Diagnosis present

## 2018-09-02 DIAGNOSIS — Z95810 Presence of automatic (implantable) cardiac defibrillator: Secondary | ICD-10-CM

## 2018-09-02 LAB — CBC WITH DIFFERENTIAL/PLATELET
Abs Immature Granulocytes: 0.02 10*3/uL (ref 0.00–0.07)
Basophils Absolute: 0.1 10*3/uL (ref 0.0–0.1)
Basophils Relative: 1 %
Eosinophils Absolute: 0.1 10*3/uL (ref 0.0–0.5)
Eosinophils Relative: 1 %
HCT: 30.8 % — ABNORMAL LOW (ref 36.0–46.0)
Hemoglobin: 9.1 g/dL — ABNORMAL LOW (ref 12.0–15.0)
IMMATURE GRANULOCYTES: 0 %
Lymphocytes Relative: 20 %
Lymphs Abs: 1.1 10*3/uL (ref 0.7–4.0)
MCH: 22.1 pg — ABNORMAL LOW (ref 26.0–34.0)
MCHC: 29.5 g/dL — ABNORMAL LOW (ref 30.0–36.0)
MCV: 74.9 fL — AB (ref 80.0–100.0)
MONOS PCT: 17 %
Monocytes Absolute: 0.9 10*3/uL (ref 0.1–1.0)
Neutro Abs: 3.1 10*3/uL (ref 1.7–7.7)
Neutrophils Relative %: 61 %
Platelets: 197 10*3/uL (ref 150–400)
RBC: 4.11 MIL/uL (ref 3.87–5.11)
RDW: 21 % — ABNORMAL HIGH (ref 11.5–15.5)
WBC: 5.2 10*3/uL (ref 4.0–10.5)
nRBC: 0 % (ref 0.0–0.2)

## 2018-09-02 LAB — LACTIC ACID, PLASMA
Lactic Acid, Venous: 2 mmol/L (ref 0.5–1.9)
Lactic Acid, Venous: 1.2 mmol/L (ref 0.5–1.9)

## 2018-09-02 LAB — BASIC METABOLIC PANEL
Anion gap: 8 (ref 5–15)
BUN: 29 mg/dL — ABNORMAL HIGH (ref 8–23)
CO2: 22 mmol/L (ref 22–32)
Calcium: 8.8 mg/dL — ABNORMAL LOW (ref 8.9–10.3)
Chloride: 106 mmol/L (ref 98–111)
Creatinine, Ser: 2.14 mg/dL — ABNORMAL HIGH (ref 0.44–1.00)
GFR calc Af Amer: 26 mL/min — ABNORMAL LOW (ref >60)
GFR calc non Af Amer: 22 mL/min — ABNORMAL LOW (ref >60)
Glucose, Bld: 132 mg/dL — ABNORMAL HIGH (ref 70–99)
Potassium: 3.8 mmol/L (ref 3.5–5.1)
Sodium: 136 mmol/L (ref 135–145)

## 2018-09-02 LAB — INFLUENZA PANEL BY PCR (TYPE A & B)
INFLAPCR: POSITIVE — AB
Influenza B By PCR: NEGATIVE

## 2018-09-02 LAB — PHOSPHORUS: Phosphorus: 2.6 mg/dL (ref 2.5–4.6)

## 2018-09-02 LAB — MAGNESIUM: Magnesium: 1.9 mg/dL (ref 1.7–2.4)

## 2018-09-02 LAB — BRAIN NATRIURETIC PEPTIDE: B Natriuretic Peptide: 819.2 pg/mL — ABNORMAL HIGH (ref 0.0–100.0)

## 2018-09-02 MED ORDER — FUROSEMIDE 40 MG PO TABS
80.0000 mg | ORAL_TABLET | Freq: Every day | ORAL | Status: DC
  Administered 2018-09-03 – 2018-09-06 (×4): 80 mg via ORAL
  Filled 2018-09-02 (×4): qty 2

## 2018-09-02 MED ORDER — PANTOPRAZOLE SODIUM 40 MG PO TBEC
40.0000 mg | DELAYED_RELEASE_TABLET | Freq: Two times a day (BID) | ORAL | Status: DC
  Administered 2018-09-03 – 2018-09-06 (×7): 40 mg via ORAL
  Filled 2018-09-02 (×7): qty 1

## 2018-09-02 MED ORDER — POTASSIUM CHLORIDE CRYS ER 20 MEQ PO TBCR
20.0000 meq | EXTENDED_RELEASE_TABLET | Freq: Every day | ORAL | Status: DC
  Administered 2018-09-03 – 2018-09-06 (×4): 20 meq via ORAL
  Filled 2018-09-02 (×4): qty 1

## 2018-09-02 MED ORDER — APIXABAN 2.5 MG PO TABS
2.5000 mg | ORAL_TABLET | Freq: Two times a day (BID) | ORAL | Status: DC
  Administered 2018-09-02 – 2018-09-06 (×8): 2.5 mg via ORAL
  Filled 2018-09-02 (×8): qty 1

## 2018-09-02 MED ORDER — OSELTAMIVIR PHOSPHATE 30 MG PO CAPS
30.0000 mg | ORAL_CAPSULE | ORAL | Status: DC
  Administered 2018-09-03 – 2018-09-05 (×3): 30 mg via ORAL
  Filled 2018-09-02 (×4): qty 1

## 2018-09-02 MED ORDER — MAGNESIUM OXIDE 400 (241.3 MG) MG PO TABS
400.0000 mg | ORAL_TABLET | Freq: Every day | ORAL | Status: DC
  Administered 2018-09-03 – 2018-09-06 (×4): 400 mg via ORAL
  Filled 2018-09-02 (×4): qty 1

## 2018-09-02 MED ORDER — TRAZODONE HCL 50 MG PO TABS
50.0000 mg | ORAL_TABLET | Freq: Once | ORAL | Status: AC
  Administered 2018-09-03: 50 mg via ORAL
  Filled 2018-09-02: qty 1

## 2018-09-02 MED ORDER — LEVALBUTEROL HCL 0.63 MG/3ML IN NEBU
0.6300 mg | INHALATION_SOLUTION | Freq: Four times a day (QID) | RESPIRATORY_TRACT | Status: DC | PRN
  Filled 2018-09-02: qty 3

## 2018-09-02 MED ORDER — LEVALBUTEROL HCL 1.25 MG/0.5ML IN NEBU
1.2500 mg | INHALATION_SOLUTION | Freq: Three times a day (TID) | RESPIRATORY_TRACT | Status: DC
  Administered 2018-09-03 – 2018-09-06 (×10): 1.25 mg via RESPIRATORY_TRACT
  Filled 2018-09-02 (×10): qty 0.5

## 2018-09-02 MED ORDER — IPRATROPIUM-ALBUTEROL 0.5-2.5 (3) MG/3ML IN SOLN
3.0000 mL | Freq: Once | RESPIRATORY_TRACT | Status: AC
  Administered 2018-09-02: 3 mL via RESPIRATORY_TRACT
  Filled 2018-09-02: qty 3

## 2018-09-02 MED ORDER — IPRATROPIUM BROMIDE 0.02 % IN SOLN
0.5000 mg | Freq: Three times a day (TID) | RESPIRATORY_TRACT | Status: DC
  Administered 2018-09-03 – 2018-09-06 (×10): 0.5 mg via RESPIRATORY_TRACT
  Filled 2018-09-02 (×10): qty 2.5

## 2018-09-02 MED ORDER — ACETAMINOPHEN 325 MG PO TABS: 650.0000 mg | ORAL_TABLET | Freq: Four times a day (QID) | ORAL | Status: DC | PRN

## 2018-09-02 MED ORDER — OSELTAMIVIR PHOSPHATE 75 MG PO CAPS: 75.0000 mg | ORAL_CAPSULE | Freq: Two times a day (BID) | ORAL | Status: DC

## 2018-09-02 MED ORDER — AMIODARONE HCL 200 MG PO TABS
200.0000 mg | ORAL_TABLET | Freq: Every day | ORAL | Status: DC
  Administered 2018-09-03 – 2018-09-06 (×4): 200 mg via ORAL
  Filled 2018-09-02 (×4): qty 1

## 2018-09-02 MED ORDER — FUROSEMIDE 10 MG/ML IJ SOLN
40.0000 mg | Freq: Once | INTRAMUSCULAR | Status: AC
  Administered 2018-09-02: 40 mg via INTRAVENOUS
  Filled 2018-09-02: qty 4

## 2018-09-02 MED ORDER — CARVEDILOL 3.125 MG PO TABS
3.1250 mg | ORAL_TABLET | Freq: Two times a day (BID) | ORAL | Status: DC
  Administered 2018-09-02 – 2018-09-06 (×8): 3.125 mg via ORAL
  Filled 2018-09-02 (×8): qty 1

## 2018-09-02 MED ORDER — LEVALBUTEROL HCL 1.25 MG/0.5ML IN NEBU
1.2500 mg | INHALATION_SOLUTION | Freq: Four times a day (QID) | RESPIRATORY_TRACT | Status: DC
  Administered 2018-09-02: 1.25 mg via RESPIRATORY_TRACT
  Filled 2018-09-02: qty 0.5

## 2018-09-02 MED ORDER — ATORVASTATIN CALCIUM 40 MG PO TABS
40.0000 mg | ORAL_TABLET | Freq: Every day | ORAL | Status: DC
  Administered 2018-09-03 – 2018-09-05 (×3): 40 mg via ORAL
  Filled 2018-09-02 (×3): qty 1

## 2018-09-02 MED ORDER — IPRATROPIUM BROMIDE 0.02 % IN SOLN
0.5000 mg | Freq: Four times a day (QID) | RESPIRATORY_TRACT | Status: DC
  Administered 2018-09-02: 0.5 mg via RESPIRATORY_TRACT
  Filled 2018-09-02: qty 2.5

## 2018-09-02 MED ORDER — OSELTAMIVIR PHOSPHATE 75 MG PO CAPS
75.0000 mg | ORAL_CAPSULE | Freq: Once | ORAL | Status: AC
  Administered 2018-09-02: 75 mg via ORAL
  Filled 2018-09-02: qty 1

## 2018-09-02 MED ORDER — FERROUS GLUCONATE 324 (38 FE) MG PO TABS
324.0000 mg | ORAL_TABLET | Freq: Two times a day (BID) | ORAL | Status: DC
  Administered 2018-09-02 – 2018-09-06 (×8): 324 mg via ORAL
  Filled 2018-09-02 (×8): qty 1

## 2018-09-02 MED ORDER — MAGNESIUM SULFATE 2 GM/50ML IV SOLN
2.0000 g | Freq: Once | INTRAVENOUS | Status: AC
  Administered 2018-09-02: 2 g via INTRAVENOUS
  Filled 2018-09-02: qty 50

## 2018-09-02 MED ORDER — IPRATROPIUM-ALBUTEROL 0.5-2.5 (3) MG/3ML IN SOLN: 3.0000 mL | Freq: Once | RESPIRATORY_TRACT | Status: DC

## 2018-09-02 NOTE — ED Notes (Signed)
ED TO INPATIENT HANDOFF REPORT  ED Nurse Name and Phone #: Sadie Haber. RN  S Name/Age/Gender Stephanie Yates 73 y.o. female Room/Bed: WA10/WA10  Code Status   Code Status: Full Code  Home/SNF/Other Home Patient oriented to: self, place, time and situation Is this baseline? Yes   Triage Complete: Triage complete  Chief Complaint SOB/Fever  Triage Note Patient here from home via EMS with complaints of SOB and fever "all week". Progressively worse. Given 125 mg Solu-medrol, 1000 mg Tylenol, x2 Duo-neb. Fever up to 102 at home.    Allergies Allergies  Allergen Reactions  . Strawberry Extract Hives, Itching, Swelling and Other (See Comments)    Hives and itching     Level of Care/Admitting Diagnosis ED Disposition    ED Disposition Condition Comment   Admit  Hospital Area: [facility] [[id number]]  Level of Care: Telemetry [5]  Admit to tele based on following criteria: Other see comments  Comments: Respiratory distress  Diagnosis: Influenza A [213086]  Admitting Physician: Reubin Milan [[id number]]  Attending Physician: Reubin Milan [[id number]]  PT Class (Do Not Modify): Observation [104]  PT Acc Code (Do Not Modify): Observation [10022]       B Medical/Surgery History Past Medical History:  Diagnosis Date  . Chronic renal insufficiency, stage III (moderate) (HCC)    Cr 2.0 5/15  . Chronic systolic CHF (congestive heart failure) ([place])   . History of tobacco abuse   . Hyperlipidemia   . Hypertension   . Nonischemic cardiomyopathy ([place])    EF 10-15% on 11/2012 cath  . Sinus bradycardia 12/21/2012  . Ventricular tachycardia ([place]) 12/14/2012   Past Surgical History:  Procedure Laterality Date  . ESOPHAGOGASTRODUODENOSCOPY (EGD) WITH PROPOFOL N/A 06/18/2017   Procedure: ESOPHAGOGASTRODUODENOSCOPY (EGD) WITH PROPOFOL;  Surgeon: Ronnette Juniper, MD;  Location: [facility];  Service: Gastroenterology;  Laterality: N/A;  . IMPLANTABLE  CARDIOVERTER DEFIBRILLATOR IMPLANT  12/19/2012   St. Jude dual-chamber ICD, serial number [id number]  . IMPLANTABLE CARDIOVERTER DEFIBRILLATOR IMPLANT N/A 12/19/2012   Procedure: IMPLANTABLE CARDIOVERTER DEFIBRILLATOR IMPLANT;  Surgeon: Deboraha Sprang, MD;  Location: [facility] CATH LAB;  Service: Cardiovascular;  Laterality: N/A;  . LEFT AND RIGHT HEART CATHETERIZATION WITH CORONARY ANGIOGRAM N/A 12/17/2012   Procedure: LEFT AND RIGHT HEART CATHETERIZATION WITH CORONARY ANGIOGRAM;  Surgeon: Sherren Mocha, MD;  Location: [facility] CATH LAB;  Service: Cardiovascular;  Laterality: N/A;     A IV Location/Drains/Wounds Patient Lines/Drains/Airways Status   Active Line/Drains/Airways    Name:   Placement date:   Placement time:   Site:   Days:   Peripheral IV 09/02/18 Left Antecubital   09/02/18    1513    Antecubital   less than 1          Intake/Output Last 24 hours No intake or output data in the 24 hours ending 09/02/18 1941  Labs/Imaging Results for orders placed or performed during the hospital encounter of 09/02/18 (from the past 48 hour(s))  Influenza panel by PCR (type A & B)     Status: Abnormal   Collection Time: 09/02/18  3:41 PM  Result Value Ref Range   Influenza A By PCR POSITIVE (A) NEGATIVE   Influenza B By PCR NEGATIVE NEGATIVE    Comment: (NOTE) The Xpert Xpress Flu assay is intended as an aid in the diagnosis of  influenza and should not be used as a sole basis for treatment.  This  assay is FDA approved for nasopharyngeal swab specimens only. Nasal  washings and aspirates are unacceptable for Xpert Xpress Flu testing. Performed at [facility], [place] [address], [place], [place] [address]   Basic metabolic panel     Status: Abnormal   Collection Time: 09/02/18  3:41 PM  Result Value Ref Range   Sodium 136 135 - 145 mmol/L   Potassium 3.8 3.5 - 5.1 mmol/L   Chloride 106 98 - 111 mmol/L   CO2 22 22 - 32 mmol/L   Glucose, Bld 132 (H) 70 - 99 mg/dL   BUN 29 (H) 8  - 23 mg/dL   Creatinine, Ser 2.14 (H) 0.44 - 1.00 mg/dL   Calcium 8.8 (L) 8.9 - 10.3 mg/dL   GFR calc non Af Amer 22 (L) >60 mL/min   GFR calc Af Amer 26 (L) >60 mL/min   Anion gap 8 5 - 15    Comment: Performed at [facility], [place] [address] [place], [place] [address]  CBC with Differential/Platelet     Status: Abnormal   Collection Time: 09/02/18  3:41 PM  Result Value Ref Range   WBC 5.2 4.0 - 10.5 K/uL   RBC 4.11 3.87 - 5.11 MIL/uL   Hemoglobin 9.1 (L) 12.0 - 15.0 g/dL   HCT 30.8 (L) 36.0 - 46.0 %   MCV 74.9 (L) 80.0 - 100.0 fL   MCH 22.1 (L) 26.0 - 34.0 pg   MCHC 29.5 (L) 30.0 - 36.0 g/dL   RDW 21.0 (H) 11.5 - 15.5 %   Platelets 197 150 - 400 K/uL   nRBC 0.0 0.0 - 0.2 %   Neutrophils Relative % 61 %   Neutro Abs 3.1 1.7 - 7.7 K/uL   Lymphocytes Relative 20 %   Lymphs Abs 1.1 0.7 - 4.0 K/uL   Monocytes Relative 17 %   Monocytes Absolute 0.9 0.1 - 1.0 K/uL   Eosinophils Relative 1 %   Eosinophils Absolute 0.1 0.0 - 0.5 K/uL   Basophils Relative 1 %   Basophils Absolute 0.1 0.0 - 0.1 K/uL   Immature Granulocytes 0 %   Abs Immature Granulocytes 0.02 0.00 - 0.07 K/uL    Comment: Performed at [facility], [place] [address] [place], [place] [address]  Lactic acid, plasma     Status: Abnormal   Collection Time: 09/02/18  3:41 PM  Result Value Ref Range   Lactic Acid, Venous 2.0 (HH) 0.5 - 1.9 mmol/L    Comment: CRITICAL RESULT CALLED TO, READ BACK BY AND VERIFIED WITH: B.JESSEE AT 1627 ON 09/02/18 BY N.THOMPSON Performed at [facility], [place] [address] [place], [place] [address]   Magnesium     Status: None   Collection Time: 09/02/18  3:41 PM  Result Value Ref Range   Magnesium 1.9 1.7 - 2.4 mg/dL    Comment: Performed at [facility], [place] [address] [place], [place] [address]  Phosphorus     Status: None   Collection Time: 09/02/18  3:41 PM  Result Value Ref Range   Phosphorus 2.6 2.5 - 4.6  mg/dL    Comment: Performed at [facility], [place] [address] [place], [place] [address]   Dg Chest Port 1 View  Result Date: 09/02/2018 CLINICAL DATA:  Cough and dyspnea EXAM: PORTABLE CHEST 1 VIEW COMPARISON:  06/07/2018 chest radiograph. FINDINGS: Two lead left subclavian pacemaker is stable in configuration. Stable cardiomediastinal silhouette with moderate cardiomegaly. No pneumothorax. No pleural effusion. Mild pulmonary edema. IMPRESSION: Mild congestive heart failure.  Electronically Signed   By: Ilona Sorrel M.D.   On: 09/02/2018 16:22    Pending Labs Unresulted Labs (From admission, onward)    Start     Ordered   09/03/18 0500  Comprehensive metabolic panel  Tomorrow morning,   R     09/02/18 1923   09/03/18 0500  CBC WITH DIFFERENTIAL  Tomorrow morning,   R     09/02/18 1923   09/02/18 1859  Brain natriuretic peptide  Add-on,   R     09/02/18 1858   09/02/18 1516  Lactic acid, plasma  Now then every 2 hours,   STAT     09/02/18 1515          Vitals/Pain Today's Vitals   09/02/18 1543 09/02/18 1700 09/02/18 1800 09/02/18 1930  BP:  (!) 119/59 123/61 131/65  Pulse: 75 75 79 76  Resp: 16 16 18 20   Temp:      TempSrc:      SpO2: 99% 94% 95% 93%  PainSc:        Isolation Precautions No active isolations  Medications Medications  magnesium sulfate IVPB 2 g 50 mL (has no administration in time range)  levalbuterol (XOPENEX) nebulizer solution 1.25 mg (has no administration in time range)  ipratropium (ATROVENT) nebulizer solution 0.5 mg (has no administration in time range)  levalbuterol (XOPENEX) nebulizer solution 0.63 mg (has no administration in time range)  oseltamivir (TAMIFLU) capsule 30 mg (has no administration in time range)  ipratropium-albuterol (DUONEB) 0.5-2.5 (3) MG/3ML nebulizer solution 3 mL (3 mLs Nebulization Given 09/02/18 1543)  oseltamivir (TAMIFLU) capsule 75 mg (75 mg Oral Given 09/02/18 1642)  furosemide (LASIX) injection 40 mg  (40 mg Intravenous Given 09/02/18 1644)    Mobility walks Low fall risk   Focused Assessments Cardiac Assessment Handoff:    Lab Results  Component Value Date   TROPONINI 0.72 ([place]) 06/07/2018   No results found for: DDIMER Does the Patient currently have chest pain? No     R Recommendations: See Admitting Provider Note  Report given to:   Additional Notes:

## 2018-09-02 NOTE — ED Provider Notes (Signed)
[facility] DEPT Provider Note   CSN: [id number] Arrival date & time: 09/02/18  1455    History   Chief Complaint Chief Complaint  Patient presents with  . Shortness of Breath  . Fever    HPI Stephanie Yates is a 73 y.o. female.     The history is provided by the patient.  Shortness of Breath  Severity:  Moderate Onset quality:  Gradual Duration:  2 days Timing:  Intermittent Progression:  Worsening Chronicity:  New Relieved by:  Nothing Worsened by:  Nothing Associated symptoms: chest pain, cough, fever and sputum production   Associated symptoms: no abdominal pain and no hemoptysis   Associated symptoms comment:  Chest pain with cough Fever  Associated symptoms: chest pain and cough   Reports onset of cough, fevers, fatigue.  This started 2 days ago. 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 She a non-smoker.  She has an ICD in place with no shocks.  Past Medical History:  Diagnosis Date  . Chronic renal insufficiency, stage III (moderate) (HCC)    Cr 2.0 5/15  . Chronic systolic CHF (congestive heart failure) ([place])   . History of tobacco abuse   . Hyperlipidemia   . Hypertension   . Nonischemic cardiomyopathy ([place])    EF 10-15% on 11/2012 cath  . Sinus bradycardia 12/21/2012  . Ventricular tachycardia ([place]) 12/14/2012    Patient Active Problem List   Diagnosis Date Noted  . Rapid atrial fibrillation ([place]) 06/08/2018  . Acute on chronic combined systolic and diastolic heart failure ([place]) 06/07/2018  . Anemia 06/07/2018  . Acute GI bleeding 06/15/2017  . Chronic anticoagulation   . Acute on chronic congestive heart failure ([place])   . HLD (hyperlipidemia) 03/05/2017  . CKD (chronic kidney disease), stage III ([place]) 03/05/2017  . Chest pain 03/05/2017  . Symptomatic anemia 03/05/2017  . PAF (paroxysmal atrial fibrillation) ([place]) 04/01/2013  . ICD (implantable cardioverter-defibrillator), dual, st  Judes 04/01/2013  . PVC's (premature ventricular contractions) 04/01/2013  . Hypokalemia 12/21/2012  . History of tobacco abuse 12/21/2012  . Elevated troponin 12/21/2012  . Sinus bradycardia 12/21/2012  . Hyperkalemia 12/20/2012  . Nonischemic cardiomyopathy ([place]) 12/20/2012  . UTI (urinary tract infection) 12/20/2012  . Acute on chronic combined systolic and diastolic CHF (congestive heart failure) ([place]) 12/20/2012  . Ventricular tachycardia ([place]) 12/14/2012  . Hypertension 12/14/2012    Past Surgical History:  Procedure Laterality Date  . ESOPHAGOGASTRODUODENOSCOPY (EGD) WITH PROPOFOL N/A 06/18/2017   Procedure: ESOPHAGOGASTRODUODENOSCOPY (EGD) WITH PROPOFOL;  Surgeon: Ronnette Juniper, MD;  Location: [facility];  Service: Gastroenterology;  Laterality: N/A;  . IMPLANTABLE CARDIOVERTER DEFIBRILLATOR IMPLANT  12/19/2012   St. Jude dual-chamber ICD, serial number [id number]  . IMPLANTABLE CARDIOVERTER DEFIBRILLATOR IMPLANT N/A 12/19/2012   Procedure: IMPLANTABLE CARDIOVERTER DEFIBRILLATOR IMPLANT;  Surgeon: Deboraha Sprang, MD;  Location: [facility] CATH LAB;  Service: Cardiovascular;  Laterality: N/A;  . LEFT AND RIGHT HEART CATHETERIZATION WITH CORONARY ANGIOGRAM N/A 12/17/2012   Procedure: LEFT AND RIGHT HEART CATHETERIZATION WITH CORONARY ANGIOGRAM;  Surgeon: Sherren Mocha, MD;  Location: [facility] CATH LAB;  Service: Cardiovascular;  Laterality: N/A;     OB History   No obstetric history on file.      Home Medications    Prior to Admission medications   Medication Sig Start Date End Date Taking? Authorizing Provider  amiodarone (PACERONE) 200 MG tablet Take 1 tablet (200 mg total) by mouth daily. 07/05/18   Deboraha Sprang,  MD  atorvastatin (LIPITOR) 40 MG tablet Take 1 tablet (40 mg total) by mouth daily at 6 PM. 07/05/18   Deboraha Sprang, MD  carvedilol (COREG) 3.125 MG tablet Take 1 tablet (3.125 mg total) by mouth 2 (two) times daily with a meal. 07/05/18   Deboraha Sprang, MD  Cholecalciferol  (VITAMIN D-3 PO) Take 1,000 Units by mouth at bedtime.     [provider]  ELIQUIS 2.5 MG TABS tablet TAKE 1 TABLET BY MOUTH TWICE DAILY 08/13/18   Deboraha Sprang, MD  furosemide (LASIX) 80 MG tablet Take 1 tablet (80 mg total) by mouth daily. 07/05/18   Deboraha Sprang, MD  magnesium oxide (MAG-OX) 400 (241.3 Mg) MG tablet Take 1 tablet (400 mg total) by mouth daily. 07/17/18   Deboraha Sprang, MD  pantoprazole (PROTONIX) 40 MG tablet Take 1 tablet (40 mg total) by mouth 2 (two) times daily before a meal. 06/18/17   Lavina Hamman, MD  potassium chloride SA (K-DUR,KLOR-CON) 20 MEQ tablet TAKE 1 TABLET BY MOUTH EVERY DAY 08/07/18   Deboraha Sprang, MD    Family History Family History  Problem Relation Age of Onset  . Cancer Mother     Social History Social History   Tobacco Use  . Smoking status: Former Research scientist (life sciences)  . Smokeless tobacco: Never Used  Substance Use Topics  . Alcohol use: No  . Drug use: No     Allergies   Strawberry extract   Review of Systems Review of Systems  Constitutional: Positive for fatigue and fever.  Respiratory: Positive for cough, sputum production and shortness of breath. Negative for hemoptysis.   Cardiovascular: Positive for chest pain.  Gastrointestinal: Negative for abdominal pain.  All other systems reviewed and are negative.    Physical Exam Updated Vital Signs BP 139/66 (BP Location: Right Arm)   Pulse 78   Temp (!) 100.8 F (38.2 C) (Oral)   Resp (!) 22   SpO2 98%   Physical Exam  CONSTITUTIONAL: Elderly, mild distress noted HEAD: Normocephalic/atraumatic EYES: EOMI/PERRL ENMT: Mucous membranes moist NECK: supple no meningeal signs SPINE/BACK:entire spine nontender CV: S1/S2 noted, no murmurs/rubs/gallops noted LUNGS: Wheezing bilaterally, crackles in left base ABDOMEN: soft, nontender, no rebound or guarding, bowel sounds noted throughout abdomen GU:no cva tenderness NEURO: Pt is awake/alert/appropriate, moves all  extremitiesx4.  No facial droop.   EXTREMITIES: pulses normal/equal, full ROM, no lower extremity edema noted SKIN: warm, color normal PSYCH: no abnormalities of mood noted, alert and oriented to situation  ED Treatments / Results  Labs (all labs ordered are listed, but only abnormal results are displayed) Labs Reviewed  INFLUENZA PANEL BY PCR (TYPE A & B) - Abnormal; Notable for the following components:      Result Value   Influenza A By PCR POSITIVE (*)    All other components within normal limits  BASIC METABOLIC PANEL - Abnormal; Notable for the following components:   Glucose, Bld 132 (*)    BUN 29 (*)    Creatinine, Ser 2.14 (*)    Calcium 8.8 (*)    GFR calc non Af Amer 22 (*)    GFR calc Af Amer 26 (*)    All other components within normal limits  CBC WITH DIFFERENTIAL/PLATELET - Abnormal; Notable for the following components:   Hemoglobin 9.1 (*)    HCT 30.8 (*)    MCV 74.9 (*)    MCH 22.1 (*)    MCHC 29.5 (*)  RDW 21.0 (*)    All other components within normal limits  LACTIC ACID, PLASMA - Abnormal; Notable for the following components:   Lactic Acid, Venous 2.0 (*)    All other components within normal limits  LACTIC ACID, PLASMA    EKG EKG Interpretation  Date/Time:  Sunday September 02 2018 15:51:35 EDT Ventricular Rate:  76 PR Interval:    QRS Duration: 134 QT Interval:  420 QTC Calculation: 460 R Axis:   -19 Text Interpretation:  Atrial-paced complexes Ventricular premature complex Left bundle branch block Confirmed by Ripley Fraise [phone]) on 09/02/2018 4:06:23 PM   Radiology Dg Chest Port 1 View  Result Date: 09/02/2018 CLINICAL DATA:  Cough and dyspnea EXAM: PORTABLE CHEST 1 VIEW COMPARISON:  06/07/2018 chest radiograph. FINDINGS: Two lead left subclavian pacemaker is stable in configuration. Stable cardiomediastinal silhouette with moderate cardiomegaly. No pneumothorax. No pleural effusion. Mild pulmonary edema. IMPRESSION: Mild congestive heart  failure. Electronically Signed   By: Ilona Sorrel M.D.   On: 09/02/2018 16:22    Procedures Procedures   Medications Ordered in ED Medications  oseltamivir (TAMIFLU) capsule 75 mg (has no administration in time range)  furosemide (LASIX) injection 40 mg (has no administration in time range)  ipratropium-albuterol (DUONEB) 0.5-2.5 (3) MG/3ML nebulizer solution 3 mL (3 mLs Nebulization Given 09/02/18 1543)     Initial Impression / Assessment and Plan / ED Course  I have reviewed the triage vital signs and the nursing notes.  Pertinent labs & imaging results that were available during my care of the patient were reviewed by me and considered in my medical decision making (see chart for details).        4:30 PM Pt with influenza as well as pulmonary edema Pt will likely need admission D/w dr Johnney Killian at shift change   Final Clinical Impressions(s) / ED Diagnoses   Final diagnoses:  Influenza  Acute pulmonary edema ([place])  AKI (acute kidney injury) [facility])    ED Discharge Orders    None       Ripley Fraise, MD 09/02/18 1630

## 2018-09-02 NOTE — Progress Notes (Signed)
PHARMACY NOTE:  ANTIMICROBIAL RENAL DOSAGE ADJUSTMENT  Current antimicrobial regimen includes a mismatch between antimicrobial dosage and estimated renal function.  As per policy approved by the Pharmacy & Therapeutics and Medical Executive Committees, the antimicrobial dosage will be adjusted accordingly.  Current antimicrobial dosage:  tamiflu 75 mg bid  Indication: influenza treatment  Renal Function: scr 2.14 (crcl~22)    Antimicrobial dosage has been changed to:  30 mg daily for crcl~10-30    Thank you for allowing pharmacy to be a part of this patient's care.  Lynelle Doctor, [facility] 09/02/2018 7:21 PM

## 2018-09-02 NOTE — ED Notes (Signed)
Date and time results received: 09/02/18 1631 (use smartphrase ".now" to insert current time)  Test: lactic acid Critical Value: 2.0  Name of Provider Notified: Dr. Johnney Killian  Orders Received? Or Actions Taken?: Actions Taken: reported lactic acid 2.0 to Dr. Wilhemena Durie.

## 2018-09-02 NOTE — ED Triage Notes (Signed)
Patient here from home via EMS with complaints of SOB and fever "all week". Progressively worse. Given 125 mg Solu-medrol, 1000 mg Tylenol, x2 Duo-neb. Fever up to 102 at home.

## 2018-09-02 NOTE — ED Notes (Signed)
Bed: GQ36 Expected date:  Expected time:  Means of arrival:  Comments: SOB, fever

## 2018-09-02 NOTE — ED Provider Notes (Signed)
Patient accepted from Dr. Christy Gentles at signout.  Patient was influenza.  She is quite short of breath.  Dr. Christy Gentles has given her a nebulizer treatment and Lasix.  Plan to reassess with anticipated likely admission.  Patient reports she does still feel short of breath particularly with any activity she is very short of breath. Physical Exam  BP 123/61   Pulse 79   Temp (!) 100.8 F (38.2 C) (Oral)   Resp 18   SpO2 95%   Physical Exam Constitutional:      Comments: Alert and appropriate.  Mild increased work of breathing at rest.  Dry paroxysmal cough.  Cardiovascular:     Rate and Rhythm: Normal rate and regular rhythm.  Pulmonary:     Comments: Mild increased work of breathing.  Dry tight paroxysmal cough.  Expiratory wheeze bilateral lung fields very soft breath sounds at bases. Skin:    General: Skin is warm and dry.  Neurological:     Mental Status: She is oriented to person, place, and time.     Coordination: Coordination normal.  Psychiatric:        Mood and Affect: Mood normal.     ED Course/Procedures     Procedures  MDM  Patient reassessed.  She has not put out any urine since having received 40 mg of Lasix.  She has continued or recurrent expiratory wheeze with poor airflow to the bases.  She has mild increased work of breathing.  At this time with elderly patient having respiratory distress will plan for admission.      Charlesetta Shanks, MD 09/02/18 [phone]

## 2018-09-02 NOTE — H&P (Signed)
History and Physical    Stephanie Yates GXQ:[id number] DOB: 1946/02/21 DOA: 09/02/2018  PCP: Josetta Huddle, MD   Patient coming from: Home.  I have personally briefly reviewed patient's old medical records in [place]  Chief Complaint: Shortness of breath.  HPI: Stephanie Yates is a 73 y.o. female with medical history significant of chronic renal insufficiency, chronic systolic CHF, nonischemic cardiomyopathy, history of tobacco abuse, hyperlipidemia, hypertension, sinus bradycardia, history of V. tach who is coming to the emergency department due to a 2-day history of progressively worse shortness of breath associated with fever, fatigue, malaise, decreased appetite, nausea, 2 episodes of loose stools, sore throat, rhinorrhea, productive cough and wheezing.  She denies travel history or sick contacts.  No chest pain, palpitations, dizziness, diaphoresis, pitting edema of the lower extremities, abdominal pain, constipation, melena or hematochezia.  No dysuria, frequency or hematuria.  ED Course: Initial vital signs temperature 100.8 F, pulse 78, respirations 22, blood pressure 139/66 mmHg and O2 sat 98% on room air.  The patient was given supplemental oxygen, a DuoNeb, furosemide 40 mg IVP x1 dose and Tamiflu 75 mg p.o. x1.  White count is 5.2, hemoglobin 9.1 g/dL and platelets 197.  Sodium, potassium, chloride and CO2 are within expected values.  BUN was 29, creatinine 2.14, glucose 132, magnesium 1.9, phosphorus 2.6 and calcium 8.8 mg/dL.  Influenza a and B by PCR was positive for influenza A.  BNP was 819.2 pg/mL.  Her chest radiograph shows cardiomegaly.  There is no pleural effusion.  There is mild pulmonary edema.  Please see image and full radiology report for further detail.  Review of Systems: As per HPI otherwise 10 point review of systems negative.   Past Medical History:  Diagnosis Date  . Chronic renal insufficiency, stage III (moderate) (HCC)    Cr 2.0 5/15  . Chronic  systolic CHF (congestive heart failure) ([place])   . History of tobacco abuse   . Hyperlipidemia   . Hypertension   . Nonischemic cardiomyopathy ([place])    EF 10-15% on 11/2012 cath  . Sinus bradycardia 12/21/2012  . Ventricular tachycardia ([place]) 12/14/2012    Past Surgical History:  Procedure Laterality Date  . ESOPHAGOGASTRODUODENOSCOPY (EGD) WITH PROPOFOL N/A 06/18/2017   Procedure: ESOPHAGOGASTRODUODENOSCOPY (EGD) WITH PROPOFOL;  Surgeon: Ronnette Juniper, MD;  Location: [facility];  Service: Gastroenterology;  Laterality: N/A;  . IMPLANTABLE CARDIOVERTER DEFIBRILLATOR IMPLANT  12/19/2012   St. Jude dual-chamber ICD, serial number [id number]  . IMPLANTABLE CARDIOVERTER DEFIBRILLATOR IMPLANT N/A 12/19/2012   Procedure: IMPLANTABLE CARDIOVERTER DEFIBRILLATOR IMPLANT;  Surgeon: Deboraha Sprang, MD;  Location: [facility] C[facility] LAB;  Service: Cardiovascular;  Laterality: N/A;  . LEFT AND RIGHT HEART CATHETERIZATION WITH CORONARY ANGIOGRAM N/A 12/17/2012   Procedure: LEFT AND RIGHT HEART CATHETERIZATION WITH CORONARY ANGIOGRAM;  Surgeon: Sherren Mocha, MD;  Location: [facility] CATH LAB;  Service: Cardiovascular;  Laterality: N/A;     reports that she has quit smoking. She has never used smokeless tobacco. She reports that she does not drink alcohol or use drugs.  Allergies  Allergen Reactions  . Strawberry Extract Hives, Itching, Swelling and Other (See Comments)    Hives and itching     Family History  Problem Relation Age of Onset  . Cancer Mother    Prior to Admission medications   Medication Sig Start Date End Date Taking? Authorizing Provider  acetaminophen (TYLENOL) 500 MG tablet Take 1,000 mg by mouth every 6 (six) hours as needed for moderate pain.   Yes  [provider]  amiodarone (PACERONE) 200 MG tablet Take 1 tablet (200 mg total) by mouth daily. 07/05/18  Yes Deboraha Sprang, MD  atorvastatin (LIPITOR) 40 MG tablet Take 1 tablet (40 mg total) by mouth daily at 6 PM. 07/05/18  Yes Deboraha Sprang, MD  carvedilol (COREG) 3.125 MG tablet Take 1 tablet (3.125 mg total) by mouth 2 (two) times daily with a meal. 07/05/18  Yes Deboraha Sprang, MD  Cholecalciferol (VITAMIN D-3 PO) Take 1,000 Units by mouth at bedtime.    Yes [provider]  ELIQUIS 2.5 MG TABS tablet TAKE 1 TABLET BY MOUTH TWICE DAILY 08/13/18  Yes Deboraha Sprang, MD  ferrous gluconate (FERGON) 324 MG tablet Take 1 tablet by mouth 2 (two) times daily. 07/05/18  Yes [provider]  furosemide (LASIX) 80 MG tablet Take 1 tablet (80 mg total) by mouth daily. 07/05/18  Yes Deboraha Sprang, MD  magnesium oxide (MAG-OX) 400 (241.3 Mg) MG tablet Take 1 tablet (400 mg total) by mouth daily. 07/17/18  Yes Deboraha Sprang, MD  pantoprazole (PROTONIX) 40 MG tablet Take 1 tablet (40 mg total) by mouth 2 (two) times daily before a meal. 06/18/17  Yes Lavina Hamman, MD  potassium chloride SA (K-DUR,KLOR-CON) 20 MEQ tablet TAKE 1 TABLET BY MOUTH EVERY DAY 08/07/18  Yes Deboraha Sprang, MD    Physical Exam: Vitals:   09/02/18 1517 09/02/18 1543 09/02/18 1700 09/02/18 1800  BP: 139/66  (!) 119/59 123/61  Pulse: 78 75 75 79  Resp: (!) 22 16 16 18   Temp: (!) 100.8 F (38.2 C)     TempSrc: Oral     SpO2: 98% 99% 94% 95%    Constitutional: Looks acutely ill. Eyes: PERRL, lids and conjunctivae normal ENMT: Mucous membranes are moist. Posterior pharynx clear of any exudate or lesions. Neck: normal, supple, no masses, no thyromegaly Respiratory: Bilateral rhonchi and wheezing, no crackles. Normal respiratory effort. No accessory muscle use.  Cardiovascular: Regular rate and rhythm, no murmurs / rubs / gallops. No extremity edema. 2+ pedal pulses. No carotid bruits.  Abdomen: Soft, no tenderness, no masses palpated. No hepatosplenomegaly. Bowel sounds positive.  Musculoskeletal: no clubbing / cyanosis. Good ROM, no contractures. Normal muscle tone.  Skin: no rashes, lesions, ulcers on limited  Neurologic: CN 2-12 grossly  intact. Sensation intact, DTR normal. Strength 5/5 in all 4.  Psychiatric: Normal judgment and insight. Alert and oriented x 3. Normal mood.   Labs on Admission: I have personally reviewed following labs and imaging studies  CBC: Recent Labs  Lab 09/02/18 1541  WBC 5.2  NEUTROABS 3.1  HGB 9.1*  HCT 30.8*  MCV 74.9*  PLT 161   Basic Metabolic Panel: Recent Labs  Lab 09/02/18 1541  NA 136  K 3.8  CL 106  CO2 22  GLUCOSE 132*  BUN 29*  CREATININE 2.14*  CALCIUM 8.8*   GFR: CrCl cannot be calculated (Unknown ideal weight.). Liver Function Tests: No results for input(s): AST, ALT, ALKPHOS, BILITOT, PROT, ALBUMIN in the last 168 hours. No results for input(s): LIPASE, AMYLASE in the last 168 hours. No results for input(s): AMMONIA in the last 168 hours. Coagulation Profile: No results for input(s): INR, PROTIME in the last 168 hours. Cardiac Enzymes: No results for input(s): CKTOTAL, CKMB, CKMBINDEX, TROPONINI in the last 168 hours. BNP (last 3 results) No results for input(s): PROBNP in the last 8760 hours. HbA1C: No results for input(s): HGBA1C in the  last 72 hours. CBG: No results for input(s): GLUCAP in the last 168 hours. Lipid Profile: No results for input(s): CHOL, HDL, LDLCALC, TRIG, CHOLHDL, LDLDIRECT in the last 72 hours. Thyroid Function Tests: No results for input(s): TSH, T4TOTAL, FREET4, T3FREE, THYROIDAB in the last 72 hours. Anemia Panel: No results for input(s): VITAMINB12, FOLATE, FERRITIN, TIBC, IRON, RETICCTPCT in the last 72 hours. Urine analysis:    Component Value Date/Time   COLORURINE YELLOW 08/29/2014 2035   APPEARANCEUR CLOUDY (A) 08/29/2014 2035   LABSPEC 1.017 08/29/2014 2035   PHURINE 6.0 08/29/2014 2035   GLUCOSEU NEGATIVE 08/29/2014 2035   HGBUR SMALL (A) 08/29/2014 2035   BILIRUBINUR NEGATIVE 08/29/2014 2035   KETONESUR NEGATIVE 08/29/2014 2035   PROTEINUR NEGATIVE 08/29/2014 2035   UROBILINOGEN 1.0 08/29/2014 2035   NITRITE  NEGATIVE 08/29/2014 2035   LEUKOCYTESUR TRACE (A) 08/29/2014 2035    Radiological Exams on Admission: Dg Chest Port 1 View  Result Date: 09/02/2018 CLINICAL DATA:  Cough and dyspnea EXAM: PORTABLE CHEST 1 VIEW COMPARISON:  06/07/2018 chest radiograph. FINDINGS: Two lead left subclavian pacemaker is stable in configuration. Stable cardiomediastinal silhouette with moderate cardiomegaly. No pneumothorax. No pleural effusion. Mild pulmonary edema. IMPRESSION: Mild congestive heart failure. Electronically Signed   By: Ilona Sorrel M.D.   On: 09/02/2018 16:22   06/07/2018 Echo ------------------------------------------------------------------- LV EF: 20% -   25%  ------------------------------------------------------------------- Indications:      CHF - 428.0.  ------------------------------------------------------------------- History:   PMH:  ICD. Chronic kidney disease.  Chest pain.  Atrial fibrillation.  Congestive heart failure.  Risk factors:  Current tobacco use. Hypertension. Dyslipidemia.  ------------------------------------------------------------------- Study Conclusions  - Left ventricle: The cavity size was severely dilated. There was   moderate concentric hypertrophy. Systolic function was severely   reduced. The estimated ejection fraction was in the range of 20%   to 25%. Severe global hypokinesis with inferior, apical and   lateral akinesis. The study is not technically sufficient to   allow evaluation of LV diastolic function. - Mitral valve: There was moderate to severe posteriorly directed   regurgitation. Valve area by continuity equation (using LVOT   flow): 2.01 cm^2. - Left atrium: Severely dilated. - Right ventricle: The cavity size was mildly dilated. Moderately   reduced systolic function. - Tricuspid valve: There was moderate regurgitation. - Pulmonary arteries: PA peak pressure: 41 mm Hg (S). - Systemic veins: The IVC measures <2.1 cm, but does not  collapse   >50%, suggesting an elevated RA pressure of 8 mmHg. - Pericardium, extracardiac: A trivial pericardial effusion was   identified posterior to the heart.  Impressions:  - Compared to a prior study in 2018, the LVEF is similar around 25%   with inferior, apical and lateral akinesis and global   hypokinesis, severe LAE, moderate to severe MR, moderate TR and   an RVSP of 41 mmHg. A trivial posterior pericardial effusion was   noted.  EKG: Independently reviewed. Vent. rate 76 BPM PR interval * ms QRS duration 134 ms QT/QTc 420/460 ms P-R-T axes * -19 58 Atrial-paced complexes Ventricular premature complex Left bundle branch block  Assessment/Plan Principal Problem:   Influenza A Observation/Telemetry. Continue supplemental oxygen. Continue droplet precautions. Continue bronchodilators. Acetaminophen as needed. Continue oseltamivir 30 mg p.o. twice daily.  Active Problems:   Chronic combined systolic and diastolic congestive heart failure (HCC) Received furosemide earlier in the ER. Continue supplemental oxygen for influenza  Continue furosemide and carvedilol.    Ventricular tachycardia (HCC) Continue amiodarone 200  mg po daily.  Continue carvedilol. Continue cardiac telemetry. Use Xopenex instead of albuterol.    Hypertension Continue carvedilol 3.125 mg p.o. twice daily. Also on furosemide. Monitor BP, HR, renal function and electrolytes.    PAF (paroxysmal atrial fibrillation) (HCC) CHA2DS2-VASc Score of at least 4. Anticoagulation with eliquis. Continue carvedilol.    HLD (hyperlipidemia) Continue atorvastatin 40 mg po daily. Check LFT's in AM Fasting lipid follow up as an outpatient.    CKD (chronic kidney disease), stage III (HCC) Higher than baseline    Anemia Continue iron supplementation. Monitor hematocrit and hemoglobin.    Lactic acidosis Resolved                                        DVT prophylaxis: On Eliquis. Code  Status: Full code. Family Communication:  Disposition Plan: Observation for influenza treatment Consults called:  Admission status: Observation/Telemetry.   Reubin Milan MD [facility]  09/02/2018, 7:19 PM   This document was prepared using Dragon voice recognition software and may contain some unintended transcription errors.

## 2018-09-03 DIAGNOSIS — J101 Influenza due to other identified influenza virus with other respiratory manifestations: Secondary | ICD-10-CM | POA: Diagnosis not present

## 2018-09-03 LAB — CBC WITH DIFFERENTIAL/PLATELET
Abs Immature Granulocytes: 0.02 10*3/uL (ref 0.00–0.07)
Basophils Absolute: 0 10*3/uL (ref 0.0–0.1)
Basophils Relative: 0 %
Eosinophils Absolute: 0 10*3/uL (ref 0.0–0.5)
Eosinophils Relative: 0 %
HCT: 29.1 % — ABNORMAL LOW (ref 36.0–46.0)
Hemoglobin: 8.5 g/dL — ABNORMAL LOW (ref 12.0–15.0)
Immature Granulocytes: 0 %
Lymphocytes Relative: 19 %
Lymphs Abs: 0.9 10*3/uL (ref 0.7–4.0)
MCH: 22.3 pg — ABNORMAL LOW (ref 26.0–34.0)
MCHC: 29.2 g/dL — AB (ref 30.0–36.0)
MCV: 76.2 fL — ABNORMAL LOW (ref 80.0–100.0)
Monocytes Absolute: 0.6 10*3/uL (ref 0.1–1.0)
Monocytes Relative: 12 %
Neutro Abs: 3.4 10*3/uL (ref 1.7–7.7)
Neutrophils Relative %: 69 %
Platelets: 169 10*3/uL (ref 150–400)
RBC: 3.82 MIL/uL — ABNORMAL LOW (ref 3.87–5.11)
RDW: 21.2 % — ABNORMAL HIGH (ref 11.5–15.5)
WBC: 4.9 10*3/uL (ref 4.0–10.5)
nRBC: 0 % (ref 0.0–0.2)

## 2018-09-03 LAB — COMPREHENSIVE METABOLIC PANEL
ALBUMIN: 2.5 g/dL — AB (ref 3.5–5.0)
ALK PHOS: 71 U/L (ref 38–126)
ALT: 26 U/L (ref 0–44)
AST: 56 U/L — ABNORMAL HIGH (ref 15–41)
Anion gap: 7 (ref 5–15)
BUN: 32 mg/dL — ABNORMAL HIGH (ref 8–23)
CO2: 23 mmol/L (ref 22–32)
Calcium: 8.4 mg/dL — ABNORMAL LOW (ref 8.9–10.3)
Chloride: 108 mmol/L (ref 98–111)
Creatinine, Ser: 1.98 mg/dL — ABNORMAL HIGH (ref 0.44–1.00)
GFR calc Af Amer: 28 mL/min — ABNORMAL LOW (ref >60)
GFR calc non Af Amer: 24 mL/min — ABNORMAL LOW (ref >60)
Glucose, Bld: 128 mg/dL — ABNORMAL HIGH (ref 70–99)
Potassium: 4.1 mmol/L (ref 3.5–5.1)
Sodium: 138 mmol/L (ref 135–145)
Total Bilirubin: 0.7 mg/dL (ref 0.3–1.2)
Total Protein: 7.1 g/dL (ref 6.5–8.1)

## 2018-09-03 MED ORDER — METHYLPREDNISOLONE SODIUM SUCC 40 MG IJ SOLR
40.0000 mg | Freq: Three times a day (TID) | INTRAMUSCULAR | Status: AC
  Administered 2018-09-03 – 2018-09-04 (×5): 40 mg via INTRAVENOUS
  Filled 2018-09-03 (×5): qty 1

## 2018-09-03 MED ORDER — GUAIFENESIN ER 600 MG PO TB12
1200.0000 mg | ORAL_TABLET | Freq: Two times a day (BID) | ORAL | Status: DC
  Administered 2018-09-03 – 2018-09-06 (×7): 1200 mg via ORAL
  Filled 2018-09-03 (×7): qty 2

## 2018-09-03 NOTE — Plan of Care (Signed)
Plan of care reviewed and discussed with the patient. 

## 2018-09-03 NOTE — Progress Notes (Signed)
PROGRESS NOTE  Stephanie Yates DXA:[id number] DOB: 1945/11/29 DOA: 09/02/2018 PCP: Josetta Huddle, MD  HPI/Recap of past 24 hours: Stephanie Yates is a 73 y.o. female with medical history significant of CKD3 , chronic systolic CHF with LVEF 20-94%, nonischemic cardiomyopathy, history of tobacco abuse, hyperlipidemia, hypertension, sinus bradycardia/Vtach post pacemaker placement who is coming to the emergency department due to a 2-day history of progressively worsening shortness of breath associated with fever, fatigue, malaise, decreased appetite, nausea, 2 episodes of loose stools, sore throat, rhinorrhea, productive cough and wheezing.  She denies travel history or sick contacts.  No chest pain, palpitations, dizziness, diaphoresis, pitting edema of the lower extremities, abdominal pain, constipation, melena or hematochezia.  No dysuria, frequency or hematuria.   Influenza a and B by PCR was positive for influenza A.  BNP was 819.2 pg/mL.  Her chest radiograph shows cardiomegaly.  There is no pleural effusion.  There is mild pulmonary edema.  TRH asked to admit.  09/03/18: Patient seen and examined at her bedside.  Reports her breathing is improving however still feel short of breath with minimal movement and very weak.  Cough is the same and persistent.  Denies any chest pain.  Assessment/Plan: Principal Problem:   Influenza A Active Problems:   Ventricular tachycardia (HCC)   Hypertension   PAF (paroxysmal atrial fibrillation) (HCC)   HLD (hyperlipidemia)   CKD (chronic kidney disease), stage III (HCC)   Anemia   Lactic acidosis   Chronic combined systolic and diastolic congestive heart failure (HCC)  Influenza A infection Presented with T-max 100.8, tachypnea with respiration rate of 25, and positive influenza A PCR Continue Tamiflu 30 mg twice daily x5 days Continue bronchodilators Wheezes noted on exam Start IV Solu-Medrol 40 3 times daily Start Mucinex 1200 mg twice daily and  flutter valve Continue droplet precautions Maintain O2 saturation greater than 92%  AKI on CKD 3 Baseline creatinine appears to be 1.7 with GFR of 33 She presented with creatinine of 2.14 Creatinine is improving to 1.98 Monitor urine output Avoid nephrotoxic agent Hold Lasix for now due to hypovolemia due to dehydration  Chronic combined systolic and diastolic CHF Last 2D echo done on 06/07/2018 revealed LVEF 20 to 25% with severe global hypokinesis with inferior, apical and lateral akinesis Previous 2D echo had revealed grade 1 diastolic dysfunction Does not appear to be in exacerbation, she is hypovolemic with flat neck veins and no lower extremity edema Continue to hold off Lasix Strict I's and O's and daily weight  Chronic microcytic anemia/anemia of chronic disease from CKD Hemoglobin appears stable at 8.5 with MCV of 76 No sign of overt bleeding Baseline hemoglobin appears to be 9.2 Last iron studies positive for iron deficiency Start ferrous sulfate 325 mg twice daily  Ventricular tachycardia ([place]) post pacemaker placement Continue amiodarone 200 mg po daily.  Continue carvedilol. Continue cardiac telemetry. Use Xopenex instead of albuterol. Follows with Dr. Caryl Comes outpatient   Hypertension Blood pressure is normotensive Continue carvedilol 3.125 mg p.o. twice daily. Hold off furosemide today due to hypovolemia Restart Lasix tomorrow   PAF (paroxysmal atrial fibrillation) (HCC) CHA2DS2-VASc Scoreof at least 4. Anticoagulation with eliquis. Continue carvedilol for rate control Continue amiodarone for rhythm control Follows with Dr. Caryl Comes outpatient    HLD (hyperlipidemia) Continue atorvastatin 40 mg po daily. Normal LFTs Fasting lipid follow up as an outpatient.  Generalized weakness/physical debility  PT to assess Fall precautions Out of bed to chair with every shift as tolerated  Moderate  protein calorie malnutrition Albumin 2.5 with BMI of  20 Encourage increasing protein calorie intake  Risks: Patient is high risk for decompensation due to acute influenza A infection in the setting of chronic combined systolic and diastolic CHF, cardiomyopathy with LVEF 20 to 25%, multiple comorbidities and advanced age.  Patient will require at least 2 midnights for further evaluation and treatment of present condition.                                       DVT prophylaxis: On Eliquis. Code Status: Full code. Family Communication:  None at bedside Disposition Plan:  Possibly in 1 to 2 days home when hemodynamically stable Consults called:  None     Objective: Vitals:   09/02/18 1946 09/02/18 2022 09/03/18 0611 09/03/18 0824  BP:  (!) 122/59 119/71   Pulse:  74 70   Resp:  19 20   Temp:  99.3 F (37.4 C) 98 F (36.7 C)   TempSrc:  Oral Oral   SpO2: 100% 98% 96% 97%  Weight:  59 kg    Height:  5\' 6"  (1.676 m)      Intake/Output Summary (Last 24 hours) at 09/03/2018 0856 Last data filed at 09/03/2018 0600 Gross per 24 hour  Intake 25.94 ml  Output -  Net 25.94 ml   Filed Weights   09/02/18 2022  Weight: 59 kg    Exam:  . General: 73 y.o. year-old female well developed well nourished in no acute distress.  Alert and oriented x3. . Cardiovascular: Regular rate and rhythm with no rubs or gallops.  No thyromegaly or JVD noted.  Flat neck veins. Marland Kitchen Respiratory: Diffuse wheezes bilaterally.  Poor inspiratory effort. . Abdomen: Soft nontender nondistended with normal bowel sounds x4 quadrants. . Musculoskeletal: No lower extremity edema. 2/4 pulses in all 4 extremities. Marland Kitchen Psychiatry: Mood is appropriate for condition and setting   Data Reviewed: CBC: Recent Labs  Lab 09/02/18 1541 09/03/18 0455  WBC 5.2 4.9  NEUTROABS 3.1 3.4  HGB 9.1* 8.5*  HCT 30.8* 29.1*  MCV 74.9* 76.2*  PLT 197 161   Basic Metabolic Panel: Recent Labs  Lab 09/02/18 1541 09/03/18 0455  NA 136 138  K 3.8 4.1  CL 106 108  CO2 22 23   GLUCOSE 132* 128*  BUN 29* 32*  CREATININE 2.14* 1.98*  CALCIUM 8.8* 8.4*  MG 1.9  --   PHOS 2.6  --    GFR: Estimated Creatinine Clearance: 23.2 mL/min (A) (by C-G formula based on SCr of 1.98 mg/dL (H)). Liver Function Tests: Recent Labs  Lab 09/03/18 0455  AST 56*  ALT 26  ALKPHOS 71  BILITOT 0.7  PROT 7.1  ALBUMIN 2.5*   No results for input(s): LIPASE, AMYLASE in the last 168 hours. No results for input(s): AMMONIA in the last 168 hours. Coagulation Profile: No results for input(s): INR, PROTIME in the last 168 hours. Cardiac Enzymes: No results for input(s): CKTOTAL, CKMB, CKMBINDEX, TROPONINI in the last 168 hours. BNP (last 3 results) No results for input(s): PROBNP in the last 8760 hours. HbA1C: No results for input(s): HGBA1C in the last 72 hours. CBG: No results for input(s): GLUCAP in the last 168 hours. Lipid Profile: No results for input(s): CHOL, HDL, LDLCALC, TRIG, CHOLHDL, LDLDIRECT in the last 72 hours. Thyroid Function Tests: No results for input(s): TSH, T4TOTAL, FREET4, T3FREE, THYROIDAB in the last  72 hours. Anemia Panel: No results for input(s): VITAMINB12, FOLATE, FERRITIN, TIBC, IRON, RETICCTPCT in the last 72 hours. Urine analysis:    Component Value Date/Time   COLORURINE YELLOW 08/29/2014 2035   APPEARANCEUR CLOUDY (A) 08/29/2014 2035   LABSPEC 1.017 08/29/2014 2035   PHURINE 6.0 08/29/2014 2035   GLUCOSEU NEGATIVE 08/29/2014 2035   HGBUR SMALL (A) 08/29/2014 2035   BILIRUBINUR NEGATIVE 08/29/2014 2035   KETONESUR NEGATIVE 08/29/2014 2035   PROTEINUR NEGATIVE 08/29/2014 2035   UROBILINOGEN 1.0 08/29/2014 2035   NITRITE NEGATIVE 08/29/2014 2035   LEUKOCYTESUR TRACE (A) 08/29/2014 2035   Sepsis Labs: @LABRCNTIP (procalcitonin:4,lacticidven:4)  )No results found for this or any previous visit (from the past 240 hour(s)).    Studies: Dg Chest Port 1 View  Result Date: 09/02/2018 CLINICAL DATA:  Cough and dyspnea EXAM: PORTABLE  CHEST 1 VIEW COMPARISON:  06/07/2018 chest radiograph. FINDINGS: Two lead left subclavian pacemaker is stable in configuration. Stable cardiomediastinal silhouette with moderate cardiomegaly. No pneumothorax. No pleural effusion. Mild pulmonary edema. IMPRESSION: Mild congestive heart failure. Electronically Signed   By: Ilona Sorrel M.D.   On: 09/02/2018 16:22    Scheduled Meds: . amiodarone  200 mg Oral Daily  . apixaban  2.5 mg Oral BID  . atorvastatin  40 mg Oral q1800  . carvedilol  3.125 mg Oral BID WC  . ferrous gluconate  324 mg Oral BID  . furosemide  80 mg Oral Daily  . ipratropium  0.5 mg Nebulization TID  . levalbuterol  1.25 mg Nebulization TID  . magnesium oxide  400 mg Oral Daily  . oseltamivir  30 mg Oral Q24H  . pantoprazole  40 mg Oral BID AC  . potassium chloride SA  20 mEq Oral Daily    Continuous Infusions:   LOS: 0 days     Kayleen Memos, MD [facility] Pager [phone]  If 7PM-7AM, please contact night-coverage [email or web address] Password [facility] 09/03/2018, 8:56 AM

## 2018-09-03 NOTE — Evaluation (Signed)
Physical Therapy Evaluation Patient Details Name: Stephanie Yates MRN: [id number] DOB: February 12, 1946 Today's Date: 09/03/2018   History of Present Illness  73 y.o. female with medical history significant of CKD3 , chronic systolic CHF with LVEF 65-68%, nonischemic cardiomyopathy, tobacco abuse, hyperlipidemia, hypertension, sinus bradycardia/Vtach post pacemaker placement and admitted with influenza A  Clinical Impression  Pt admitted with above diagnosis. Pt currently with functional limitations due to the deficits listed below (see PT Problem List).  Pt will benefit from skilled PT to increase their independence and safety with mobility to allow discharge to the venue listed below.  Pt assisted with ambulating in hallway and little unsteady, fatigued quickly.  Pt reports she is typically independent at baseline.  Pt requiring bil UE support upon returning to room so recommend RW however pt reports she has a cane at home (may progress to no needs).  Recommend initial supervision for mobility upon d/c and HHPT.     Follow Up Recommendations Home health PT;Supervision for mobility/OOB    Equipment Recommendations  Rolling walker with 5" wheels    Recommendations for Other Services       Precautions / Restrictions Precautions Precautions: Fall      Mobility  Bed Mobility Overal bed mobility: Needs Assistance Bed Mobility: Supine to Sit     Supine to sit: Min guard;HOB elevated        Transfers Overall transfer level: Needs assistance Equipment used: None Transfers: Sit to/from Stand Sit to Stand: Min guard         General transfer comment: min/guard for safety, cues for hand placement  Ambulation/Gait Ambulation/Gait assistance: Min guard Gait Distance (Feet): 120 Feet Assistive device: 1 person hand held assist Gait Pattern/deviations: Decreased stride length;Step-through pattern     General Gait Details: pt reports weak LE from laying in bed; HHA for support,  required 2 HHA upon returning to room due to fatigue, SPo2 92% or above on room air  Stairs            Wheelchair Mobility    Modified Rankin (Stroke Patients Only)       Balance Overall balance assessment: Mild deficits observed, not formally tested                                           Pertinent Vitals/Pain Pain Assessment: No/denies pain    Home Living Family/patient expects to be discharged to:: Private residence Living Arrangements: Alone Available Help at Discharge: Family;Available PRN/intermittently Type of Home: House       Home Layout: One level Home Equipment: Cane - single point      Prior Function Level of Independence: Independent               Hand Dominance        Extremity/Trunk Assessment        Lower Extremity Assessment Lower Extremity Assessment: Generalized weakness    Cervical / Trunk Assessment Cervical / Trunk Assessment: Normal  Communication   Communication: No difficulties  Cognition Arousal/Alertness: Awake/alert Behavior During Therapy: WFL for tasks assessed/performed Overall Cognitive Status: Within Functional Limits for tasks assessed                                        General Comments      Exercises  Assessment/Plan    PT Assessment Patient needs continued PT services  PT Problem List Decreased strength;Decreased mobility;Decreased activity tolerance;Decreased balance;Decreased knowledge of use of DME       PT Treatment Interventions DME instruction;Functional mobility training;Gait training;Therapeutic activities;Stair training;Therapeutic exercise;Balance training;Patient/family education    PT Goals (Current goals can be found in the Care Plan section)  Acute Rehab PT Goals PT Goal Formulation: With patient Time For Goal Achievement: 09/17/18 Potential to Achieve Goals: Good    Frequency Min 3X/week   Barriers to discharge         Co-evaluation               AM-PAC PT "6 Clicks" Mobility  Outcome Measure Help needed turning from your back to your side while in a flat bed without using bedrails?: None Help needed moving from lying on your back to sitting on the side of a flat bed without using bedrails?: A Little Help needed moving to and from a bed to a chair (including a wheelchair)?: A Little Help needed standing up from a chair using your arms (e.g., wheelchair or bedside chair)?: A Little Help needed to walk in hospital room?: A Little Help needed climbing 3-5 steps with a railing? : A Little 6 Click Score: 19    End of Session Equipment Utilized During Treatment: Gait belt Activity Tolerance: Patient tolerated treatment well Patient left: in chair;with call bell/phone within reach;with chair alarm set Nurse Communication: Mobility status PT Visit Diagnosis: Other abnormalities of gait and mobility (R26.89)    Time: 5051-8335 PT Time Calculation (min) (ACUTE ONLY): 18 min   Charges:   PT Evaluation $PT Eval Low Complexity: Pearl, PT, DPT Acute Rehabilitation Services Office: [phone] Pager: [phone]  Trena Platt 09/03/2018, 1:31 PM

## 2018-09-04 DIAGNOSIS — Z6820 Body mass index (BMI) 20.0-20.9, adult: Secondary | ICD-10-CM | POA: Diagnosis not present

## 2018-09-04 DIAGNOSIS — N183 Chronic kidney disease, stage 3 (moderate): Secondary | ICD-10-CM | POA: Diagnosis present

## 2018-09-04 DIAGNOSIS — J101 Influenza due to other identified influenza virus with other respiratory manifestations: Secondary | ICD-10-CM | POA: Diagnosis not present

## 2018-09-04 DIAGNOSIS — I480 Paroxysmal atrial fibrillation: Secondary | ICD-10-CM | POA: Diagnosis present

## 2018-09-04 DIAGNOSIS — I472 Ventricular tachycardia: Secondary | ICD-10-CM | POA: Diagnosis present

## 2018-09-04 DIAGNOSIS — Z7901 Long term (current) use of anticoagulants: Secondary | ICD-10-CM | POA: Diagnosis not present

## 2018-09-04 DIAGNOSIS — Z79899 Other long term (current) drug therapy: Secondary | ICD-10-CM | POA: Diagnosis not present

## 2018-09-04 DIAGNOSIS — E860 Dehydration: Secondary | ICD-10-CM | POA: Diagnosis not present

## 2018-09-04 DIAGNOSIS — Z87891 Personal history of nicotine dependence: Secondary | ICD-10-CM | POA: Diagnosis not present

## 2018-09-04 DIAGNOSIS — N179 Acute kidney failure, unspecified: Secondary | ICD-10-CM | POA: Diagnosis present

## 2018-09-04 DIAGNOSIS — Z95810 Presence of automatic (implantable) cardiac defibrillator: Secondary | ICD-10-CM | POA: Diagnosis not present

## 2018-09-04 DIAGNOSIS — E861 Hypovolemia: Secondary | ICD-10-CM | POA: Diagnosis not present

## 2018-09-04 DIAGNOSIS — J810 Acute pulmonary edema: Secondary | ICD-10-CM | POA: Diagnosis present

## 2018-09-04 DIAGNOSIS — I447 Left bundle-branch block, unspecified: Secondary | ICD-10-CM | POA: Diagnosis present

## 2018-09-04 DIAGNOSIS — D631 Anemia in chronic kidney disease: Secondary | ICD-10-CM | POA: Diagnosis present

## 2018-09-04 DIAGNOSIS — I428 Other cardiomyopathies: Secondary | ICD-10-CM | POA: Diagnosis present

## 2018-09-04 DIAGNOSIS — J9601 Acute respiratory failure with hypoxia: Secondary | ICD-10-CM | POA: Diagnosis present

## 2018-09-04 DIAGNOSIS — I5042 Chronic combined systolic (congestive) and diastolic (congestive) heart failure: Secondary | ICD-10-CM | POA: Diagnosis present

## 2018-09-04 DIAGNOSIS — E785 Hyperlipidemia, unspecified: Secondary | ICD-10-CM | POA: Diagnosis present

## 2018-09-04 DIAGNOSIS — E872 Acidosis: Secondary | ICD-10-CM | POA: Diagnosis present

## 2018-09-04 DIAGNOSIS — E440 Moderate protein-calorie malnutrition: Secondary | ICD-10-CM | POA: Diagnosis present

## 2018-09-04 DIAGNOSIS — I130 Hypertensive heart and chronic kidney disease with heart failure and stage 1 through stage 4 chronic kidney disease, or unspecified chronic kidney disease: Secondary | ICD-10-CM | POA: Diagnosis present

## 2018-09-04 DIAGNOSIS — Z91018 Allergy to other foods: Secondary | ICD-10-CM | POA: Diagnosis not present

## 2018-09-04 DIAGNOSIS — I081 Rheumatic disorders of both mitral and tricuspid valves: Secondary | ICD-10-CM | POA: Diagnosis present

## 2018-09-04 DIAGNOSIS — D509 Iron deficiency anemia, unspecified: Secondary | ICD-10-CM | POA: Diagnosis present

## 2018-09-04 LAB — BASIC METABOLIC PANEL
Anion gap: 7 (ref 5–15)
BUN: 45 mg/dL — ABNORMAL HIGH (ref 8–23)
CO2: 22 mmol/L (ref 22–32)
Calcium: 8.4 mg/dL — ABNORMAL LOW (ref 8.9–10.3)
Chloride: 107 mmol/L (ref 98–111)
Creatinine, Ser: 1.67 mg/dL — ABNORMAL HIGH (ref 0.44–1.00)
GFR calc non Af Amer: 30 mL/min — ABNORMAL LOW (ref >60)
GFR, EST AFRICAN AMERICAN: 35 mL/min — AB (ref >60)
Glucose, Bld: 123 mg/dL — ABNORMAL HIGH (ref 70–99)
Potassium: 4.5 mmol/L (ref 3.5–5.1)
Sodium: 136 mmol/L (ref 135–145)

## 2018-09-04 MED ORDER — PREDNISONE 20 MG PO TABS
20.0000 mg | ORAL_TABLET | Freq: Every day | ORAL | Status: DC
  Administered 2018-09-05: 20 mg via ORAL
  Filled 2018-09-04: qty 1

## 2018-09-04 MED ORDER — SODIUM CHLORIDE 3 % IN NEBU: 4.0000 mL | INHALATION_SOLUTION | Freq: Three times a day (TID) | RESPIRATORY_TRACT | Status: DC

## 2018-09-04 MED ORDER — SODIUM CHLORIDE 3 % IN NEBU
4.0000 mL | INHALATION_SOLUTION | Freq: Three times a day (TID) | RESPIRATORY_TRACT | Status: DC
  Administered 2018-09-04 – 2018-09-06 (×6): 4 mL via RESPIRATORY_TRACT
  Filled 2018-09-04 (×8): qty 4

## 2018-09-04 NOTE — Progress Notes (Signed)
Patient O2 sats maintained at 97/98% on room air while ambulating.  Patient ambulated to nurses station and back.  Tolerated well.  Will continue to monitor.

## 2018-09-04 NOTE — Progress Notes (Signed)
PROGRESS NOTE  Stephanie Yates OZH:[id number] DOB: [birth year] DOA: 09/02/2018 PCP: Josetta Huddle, MD  HPI/Recap of past 24 hours: Stephanie Yates is a 73 y.o. female with medical history significant of CKD3 , chronic systolic CHF with LVEF 62-95%, nonischemic cardiomyopathy, history of tobacco abuse, hyperlipidemia, hypertension, sinus bradycardia/Vtach post pacemaker placement who is coming to the emergency department due to a 2-day history of progressively worsening shortness of breath associated with fever, fatigue, malaise, decreased appetite, nausea, 2 episodes of loose stools, sore throat, rhinorrhea, productive cough and wheezing.  She denies travel history or sick contacts.  No chest pain, palpitations, dizziness, diaphoresis, pitting edema of the lower extremities, abdominal pain, constipation, melena or hematochezia.  No dysuria, frequency or hematuria.   Influenza a and B by PCR was positive for influenza A.  BNP was 819.2 pg/mL.  Her chest radiograph shows cardiomegaly.  There is no pleural effusion.  There is mild pulmonary edema.  TRH asked to admit.  09/04/18: Patient seen and examined at bedside.  No acute events overnight.  Reports persistent productive cough and dyspnea with exertion.  Wheezing on exam posteriorly.  We will continue IV steroids.  Added pulmonary toilet with Mucinex 1200 mg twice daily, hypersaline nebs 3 times daily and flutter valve every 4 hours.  Assessment/Plan: Principal Problem:   Influenza A Active Problems:   Ventricular tachycardia (HCC)   Hypertension   PAF (paroxysmal atrial fibrillation) (HCC)   HLD (hyperlipidemia)   CKD (chronic kidney disease), stage III (HCC)   Anemia   Lactic acidosis   Chronic combined systolic and diastolic congestive heart failure (HCC)  Influenza A infection Presented with T-max 100.8, tachypnea with respiration rate of 25, and positive influenza A PCR Continue Tamiflu 30 mg twice daily x5 days Continue  bronchodilators Posterior expiratory wheezes noted on exam Continue IV Solu-Medrol 40 3 times daily Start pulmonary toilet with Mucinex 12 mg twice daily, hypersaline nebs 3 times daily, flutter valve every 4 hours while awake Home O2 evaluation for DC planning  Acute hypoxic respiratory failure secondary to influenza A infection Obtain home O2 evaluation for DC planning Maintain O2 saturation greater than 92%  Resolving AKI on CKD 3 Baseline creatinine appears to be 1.6 with GFR of 35 She presented with creatinine of 2.14 Creatinine appears to be at her baseline on 09/04/2018 with creatinine of 1.67 and GFR of 35 Continue to avoid nephrotoxic agents Monitor urine output  Chronic combined systolic and diastolic CHF Last 2D echo done on 06/07/2018 revealed LVEF 20 to 25% with severe global hypokinesis with inferior, apical and lateral akinesis Previous 2D echo had revealed grade 1 diastolic dysfunction Does not appear to be in exacerbation, she is hypovolemic with flat neck veins and no lower extremity edema Resumed Lasix 80 mg daily Continue strict I's and O's Continue daily weight  Chronic microcytic anemia/anemia of chronic disease from CKD Hemoglobin appears stable at 8.5 with MCV of 76 No sign of overt bleeding Baseline hemoglobin appears to be 9.2 Last iron studies positive for iron deficiency Continue ferrous sulfate 325 mg twice daily  Ventricular tachycardia (HCC) post pacemaker placement Continue amiodarone 200 mg po daily.  Continue carvedilol. Continue cardiac telemetry. Use Xopenex instead of albuterol. Follows with Dr. Caryl Comes outpatient   Hypertension Blood pressure is normotensive Continue carvedilol 3.125 mg p.o. twice daily. Continue furosemide today due to hypovolemia   PAF (paroxysmal atrial fibrillation) (HCC) CHA2DS2-VASc Scoreof at least 4. Continue anticoagulation with eliquis. Continue carvedilol for rate control  Continue amiodarone for rhythm  control Follows with Dr. Caryl Comes outpatient   HLD (hyperlipidemia) Continue atorvastatin 40 mg po daily. Normal LFTs Fasting lipid follow up as an outpatient.  Generalized weakness/physical debility  PT recommended home health PT with DME 5 inches wheels rolling walker Fall precautions Out of bed to chair with every shift as tolerated  Moderate protein calorie malnutrition Albumin 2.5 with BMI of 20 Encourage increasing protein calorie intake  Risks: Patient is high risk for decompensation due to acute influenza A infection in the setting of multiple comorbidities including chronic combined systolic and diastolic CHF, cardiomyopathy with LVEF 20 to 25%, CKD 3, anemia of chronic disease, and advanced age.    Patient will require at least 2 midnights for further evaluation and treatment of present condition.                                       DVT prophylaxis: On Eliquis. Code Status: Full code. Family Communication:  None at bedside Disposition Plan:  Possibly tomorrow 09/05/2018 with home health services.  Patient requires another day of IV steroids and round-the-clock breathing treatments. Consults called:  None     Objective: Vitals:   09/03/18 2153 09/03/18 2156 09/04/18 0524 09/04/18 0914  BP:  (!) 117/59 129/82   Pulse:  70 75   Resp:      Temp:  (!) 97.4 F (36.3 C) 97.9 F (36.6 C)   TempSrc:  Oral Oral   SpO2: 99% 100% (!) 89% 95%  Weight:      Height:        Intake/Output Summary (Last 24 hours) at 09/04/2018 1252 Last data filed at 09/04/2018 0600 Gross per 24 hour  Intake 420 ml  Output 1 ml  Net 419 ml   Filed Weights   09/02/18 2022  Weight: 59 kg    Exam:  . General: 73 y.o. year-old female well-developed well-nourished appears uncomfortable due to persistent cough and generalized weakness. . Cardiovascular: Regular rate and rhythm with no rubs or gallops.  No JVD or thyromegaly. Marland Kitchen Respiratory: Diffuse expiratory wheezes posteriorly.   Poor inspiratory effort. . Abdomen: Soft nontender nondistended with normal bowel sounds x4 quadrants. . Musculoskeletal: No lower extremity edema. 2/4 pulses in all 4 extremities. Marland Kitchen Psychiatry: Mood is appropriate for condition and setting   Data Reviewed: CBC: Recent Labs  Lab 09/02/18 1541 09/03/18 0455  WBC 5.2 4.9  NEUTROABS 3.1 3.4  HGB 9.1* 8.5*  HCT 30.8* 29.1*  MCV 74.9* 76.2*  PLT 197 650   Basic Metabolic Panel: Recent Labs  Lab 09/02/18 1541 09/03/18 0455 09/04/18 0534  NA 136 138 136  K 3.8 4.1 4.5  CL 106 108 107  CO2 22 23 22   GLUCOSE 132* 128* 123*  BUN 29* 32* 45*  CREATININE 2.14* 1.98* 1.67*  CALCIUM 8.8* 8.4* 8.4*  MG 1.9  --   --   PHOS 2.6  --   --    GFR: Estimated Creatinine Clearance: 27.5 mL/min (A) (by C-G formula based on SCr of 1.67 mg/dL (H)). Liver Function Tests: Recent Labs  Lab 09/03/18 0455  AST 56*  ALT 26  ALKPHOS 71  BILITOT 0.7  PROT 7.1  ALBUMIN 2.5*   No results for input(s): LIPASE, AMYLASE in the last 168 hours. No results for input(s): AMMONIA in the last 168 hours. Coagulation Profile: No results for input(s): INR, PROTIME in  the last 168 hours. Cardiac Enzymes: No results for input(s): CKTOTAL, CKMB, CKMBINDEX, TROPONINI in the last 168 hours. BNP (last 3 results) No results for input(s): PROBNP in the last 8760 hours. HbA1C: No results for input(s): HGBA1C in the last 72 hours. CBG: No results for input(s): GLUCAP in the last 168 hours. Lipid Profile: No results for input(s): CHOL, HDL, LDLCALC, TRIG, CHOLHDL, LDLDIRECT in the last 72 hours. Thyroid Function Tests: No results for input(s): TSH, T4TOTAL, FREET4, T3FREE, THYROIDAB in the last 72 hours. Anemia Panel: No results for input(s): VITAMINB12, FOLATE, FERRITIN, TIBC, IRON, RETICCTPCT in the last 72 hours. Urine analysis:    Component Value Date/Time   COLORURINE YELLOW 08/29/2014 2035   APPEARANCEUR CLOUDY (A) 08/29/2014 2035   LABSPEC 1.017  08/29/2014 2035   PHURINE 6.0 08/29/2014 2035   GLUCOSEU NEGATIVE 08/29/2014 2035   HGBUR SMALL (A) 08/29/2014 2035   BILIRUBINUR NEGATIVE 08/29/2014 2035   KETONESUR NEGATIVE 08/29/2014 2035   PROTEINUR NEGATIVE 08/29/2014 2035   UROBILINOGEN 1.0 08/29/2014 2035   NITRITE NEGATIVE 08/29/2014 2035   LEUKOCYTESUR TRACE (A) 08/29/2014 2035   Sepsis Labs: @LABRCNTIP (procalcitonin:4,lacticidven:4)  )No results found for this or any previous visit (from the past 240 hour(s)).    Studies: No results found.  Scheduled Meds: . amiodarone  200 mg Oral Daily  . apixaban  2.5 mg Oral BID  . atorvastatin  40 mg Oral q1800  . carvedilol  3.125 mg Oral BID WC  . ferrous gluconate  324 mg Oral BID  . furosemide  80 mg Oral Daily  . guaiFENesin  1,200 mg Oral BID  . ipratropium  0.5 mg Nebulization TID  . levalbuterol  1.25 mg Nebulization TID  . magnesium oxide  400 mg Oral Daily  . methylPREDNISolone (SOLU-MEDROL) injection  40 mg Intravenous TID  . oseltamivir  30 mg Oral Q24H  . pantoprazole  40 mg Oral BID AC  . potassium chloride SA  20 mEq Oral Daily  . sodium chloride HYPERTONIC  4 mL Nebulization TID    Continuous Infusions:   LOS: 0 days     Kayleen Memos, MD [facility] Pager [phone]  If 7PM-7AM, please contact night-coverage [email or web address] Password [facility] 09/04/2018, 12:52 PM

## 2018-09-04 NOTE — Care Management Obs Status (Signed)
[facility] NOTIFICATION   Patient Details  Name: Stephanie Yates MRN: [id number] Date of Birth: 09-06-1945   Medicare Observation Status Notification Given:  Yes    MahabirJuliann Pulse, RN 09/04/2018, 12:23 PM

## 2018-09-05 MED ORDER — TRAZODONE HCL 50 MG PO TABS
50.0000 mg | ORAL_TABLET | Freq: Every evening | ORAL | Status: DC | PRN
  Administered 2018-09-05: 50 mg via ORAL
  Filled 2018-09-05: qty 1

## 2018-09-05 MED ORDER — METHYLPREDNISOLONE SODIUM SUCC 40 MG IJ SOLR
40.0000 mg | Freq: Two times a day (BID) | INTRAMUSCULAR | Status: DC
  Administered 2018-09-05 – 2018-09-06 (×3): 40 mg via INTRAVENOUS
  Filled 2018-09-05 (×3): qty 1

## 2018-09-05 NOTE — Progress Notes (Signed)
Pharmacist Heart Failure Core Measure Documentation  Assessment: Stephanie Yates has an EF documented as 20-25% on 06/07/18 by echo.  Rationale: Heart failure patients with left ventricular systolic dysfunction (LVSD) and an EF < 40% should be prescribed an angiotensin converting enzyme inhibitor (ACEI) or angiotensin receptor blocker (ARB) at discharge unless a contraindication is documented in the medical record.  This patient is not currently on an ACEI or ARB for HF.  This note is being placed in the record in order to provide documentation that a contraindication to the use of these agents is present for this encounter.  ACE Inhibitor or Angiotensin Receptor Blocker is contraindicated (specify all that apply)  []   ACEI allergy AND ARB allergy []   Angioedema []   Moderate or severe aortic stenosis []   Hyperkalemia []   Hypotension []   Renal artery stenosis [x]   Worsening renal function, preexisting renal disease or dysfunction   Eudelia Bunch, Pharm.D [phone] 09/05/2018 8:16 AM

## 2018-09-05 NOTE — Progress Notes (Signed)
PROGRESS NOTE    Stephanie Yates  XTG:[id number] DOB: 03-Jun-1946 DOA: 09/02/2018 PCP: Josetta Huddle, MD   Brief Narrative:73 y.o.femalewith medical history significant of CKD3 , chronic systolic CHF with LVEF 27-03%, nonischemic cardiomyopathy, history of tobacco abuse, hyperlipidemia, hypertension, sinus bradycardia/Vtach post pacemaker placement who is coming to the emergency department due toa 2-day history of progressively worseningshortness of breathassociated with fever, fatigue, malaise, decreased appetite, nausea, 2 episodes of loose stools, sore throat, rhinorrhea, productive cough and wheezing. She denies travel history or sick contacts. No chest pain, palpitations, dizziness, diaphoresis, pitting edema of the lower extremities, abdominal pain, constipation, melena or hematochezia. No dysuria, frequency or hematuria.  Influenza a and B by PCR was positive for influenza A. BNP was 819.2 pg/mL. Her chest radiograph shows cardiomegaly. There is no pleural effusion. There is mild pulmonary edema.  TRH asked to admit. Assessment & Plan:   Principal Problem:   Influenza A Active Problems:   Ventricular tachycardia (HCC)   Hypertension   PAF (paroxysmal atrial fibrillation) (HCC)   HLD (hyperlipidemia)   CKD (chronic kidney disease), stage III (HCC)   Anemia   Lactic acidosis   Chronic combined systolic and diastolic congestive heart failure (HCC)   Influenza A infection Presented with T-max 100.8, tachypnea with respiration rate of 25, and positive influenza A PCR Continue Tamiflu 30 mg twice daily x5 days Continue bronchodilators Patient still with bilateral diffuse wheezing will put her back on Solu-Medrol 40 mg 2 times a day.  She was on Solu-Medrol 40 mg 3 times a day till yesterday.  Continue Mucinex and nebulizer treatments.  Resolving AKI on CKD 3 Baseline creatinine appears to be 1.6 with GFR of 35 She presented with creatinine of 2.14. Creatinine appears  to be at her baseline on 09/04/2018 with creatinine of 1.67 and GFR of 35 Continue to avoid nephrotoxic agents Monitor urine output  Chronic combined systolic and diastolic CHF Last 2D echo done on 06/07/2018 revealed LVEF 20 to 25% with severe global hypokinesis with inferior, apical and lateral akinesis Previous 2D echo had revealed grade 1 diastolic dysfunction Resumed Lasix 80 mg daily Continue strict I's and O's Continue daily weight  Chronic microcytic anemia/anemia of chronic disease from CKD Hemoglobin appears stable at 8.5 with MCV of 76 No sign of overt bleeding Baseline hemoglobin appears to be 9.2 Last iron studies positive for iron deficiency Continue ferrous sulfate 325 mg twice daily  Ventricular tachycardia (HCC) post pacemaker placement Continue amiodarone 200 mg po daily.  Continue carvedilol. Continue cardiac telemetry. Use Xopenex instead of albuterol. Follows with Dr. Caryl Comes outpatient  Hypertension Blood pressure is normotensive Continue carvedilol3.125 mg p.o. twice daily and furosemide  PAF (paroxysmal atrial fibrillation) ([place]) CHA2DS2-VASc Scoreof at least 4. Continue anticoagulationwith eliquis. Continuecarvedilol for rate control Continue amiodarone for rhythm control Follows with Dr. Caryl Comes outpatient  HLD (hyperlipidemia) Continue atorvastatin 40 mg po daily. Normal LFTs Fasting lipid follow up as an outpatient.  Generalized weakness/physical debility  PT recommended home health PT with DME 5 inches wheels rolling walker Fall precautions Out of bed to chair with every shift as tolerated  Moderate protein calorie malnutrition Albumin 2.5 with BMI of 20 Encourage increasing protein calorie intake  DVT prophylaxis:On Eliquis. Code Status:Full code. Family Communication: None at bedside Disposition Plan: Possibly tomorrow 09/06/2018 with home health services.  Patient requires another day of IV steroids and round-the-clock  breathing treatments. Consults called: None     Estimated body mass index is 20.99 kg/m as calculated  from the following:   Height as of this encounter: 5\' 6"  (1.676 m).   Weight as of this encounter: 59 kg.   Subjective: Patient resting in bed complains of wheezing and coughing and shortness of breath anxious as she lives alone  Objective: Vitals:   09/04/18 1938 09/04/18 2000 09/05/18 0455 09/05/18 0906  BP:  129/64 135/76   Pulse:  70 70   Resp:  16 16   Temp:  98.5 F (36.9 C) 97.7 F (36.5 C)   TempSrc:  Oral Oral   SpO2: 96% 96% 90% 98%  Weight:      Height:        Intake/Output Summary (Last 24 hours) at 09/05/2018 0945 Last data filed at 09/04/2018 1300 Gross per 24 hour  Intake -  Output 1 ml  Net -1 ml   Filed Weights   09/02/18 2022  Weight: 59 kg    Examination:  General exam: Appears calm and comfortable  Respiratory system: Diffuse wheezing to auscultation. Respiratory effort normal. Cardiovascular system: S1 & S2 heard, RRR. No JVD, murmurs, rubs, gallops or clicks. No pedal edema. Gastrointestinal system: Abdomen is nondistended, soft and nontender. No organomegaly or masses felt. Normal bowel sounds heard. Central nervous system: Alert and oriented. No focal neurological deficits. Extremities: Symmetric 5 x 5 power. Skin: No rashes, lesions or ulcers Psychiatry: Judgement and insight appear normal. Mood & affect appropriate.     Data Reviewed: I have personally reviewed following labs and imaging studies  CBC: Recent Labs  Lab 09/02/18 1541 09/03/18 0455  WBC 5.2 4.9  NEUTROABS 3.1 3.4  HGB 9.1* 8.5*  HCT 30.8* 29.1*  MCV 74.9* 76.2*  PLT 197 161   Basic Metabolic Panel: Recent Labs  Lab 09/02/18 1541 09/03/18 0455 09/04/18 0534  NA 136 138 136  K 3.8 4.1 4.5  CL 106 108 107  CO2 22 23 22   GLUCOSE 132* 128* 123*  BUN 29* 32* 45*  CREATININE 2.14* 1.98* 1.67*  CALCIUM 8.8* 8.4* 8.4*  MG 1.9  --   --   PHOS 2.6  --    --    GFR: Estimated Creatinine Clearance: 27.5 mL/min (A) (by C-G formula based on SCr of 1.67 mg/dL (H)). Liver Function Tests: Recent Labs  Lab 09/03/18 0455  AST 56*  ALT 26  ALKPHOS 71  BILITOT 0.7  PROT 7.1  ALBUMIN 2.5*   No results for input(s): LIPASE, AMYLASE in the last 168 hours. No results for input(s): AMMONIA in the last 168 hours. Coagulation Profile: No results for input(s): INR, PROTIME in the last 168 hours. Cardiac Enzymes: No results for input(s): CKTOTAL, CKMB, CKMBINDEX, TROPONINI in the last 168 hours. BNP (last 3 results) No results for input(s): PROBNP in the last 8760 hours. HbA1C: No results for input(s): HGBA1C in the last 72 hours. CBG: No results for input(s): GLUCAP in the last 168 hours. Lipid Profile: No results for input(s): CHOL, HDL, LDLCALC, TRIG, CHOLHDL, LDLDIRECT in the last 72 hours. Thyroid Function Tests: No results for input(s): TSH, T4TOTAL, FREET4, T3FREE, THYROIDAB in the last 72 hours. Anemia Panel: No results for input(s): VITAMINB12, FOLATE, FERRITIN, TIBC, IRON, RETICCTPCT in the last 72 hours. Sepsis Labs: Recent Labs  Lab 09/02/18 1541 09/02/18 1905  LATICACIDVEN 2.0* 1.2    No results found for this or any previous visit (from the past 240 hour(s)).       Radiology Studies: No results found.      Scheduled Meds: . amiodarone  200 mg Oral Daily  . apixaban  2.5 mg Oral BID  . atorvastatin  40 mg Oral q1800  . carvedilol  3.125 mg Oral BID WC  . ferrous gluconate  324 mg Oral BID  . furosemide  80 mg Oral Daily  . guaiFENesin  1,200 mg Oral BID  . ipratropium  0.5 mg Nebulization TID  . levalbuterol  1.25 mg Nebulization TID  . magnesium oxide  400 mg Oral Daily  . methylPREDNISolone (SOLU-MEDROL) injection  40 mg Intravenous Q12H  . oseltamivir  30 mg Oral Q24H  . pantoprazole  40 mg Oral BID AC  . potassium chloride SA  20 mEq Oral Daily  . sodium chloride HYPERTONIC  4 mL Nebulization TID    Continuous Infusions:   LOS: 1 day      Georgette Shell, MD [facility]  If 7PM-7AM, please contact night-coverage [email or web address] Password [facility] 09/05/2018, 9:45 AM

## 2018-09-05 NOTE — Progress Notes (Signed)
Physical Therapy Treatment Patient Details Name: DESERAE JENNINGS MRN: [id number] DOB: [birth year] Today's Date: 09/05/2018    History of Present Illness 73 y.o. female with medical history significant of CKD3 , chronic systolic CHF with LVEF 96-29%, nonischemic cardiomyopathy, tobacco abuse, hyperlipidemia, hypertension, sinus bradycardia/Vtach post pacemaker placement and admitted with influenza A    PT Comments    Pt tolerated increased ambulation distance of 150' with no assistive device, no loss of balance, no dyspnea, vital signs stable. Pt feels she can manage at home, where she lives alone. She plans to return to work in an [employer].    Follow Up Recommendations  No PT follow up     Equipment Recommendations  None recommended by PT    Recommendations for Other Services       Precautions / Restrictions Precautions Precautions: Fall Restrictions Weight Bearing Restrictions: No    Mobility  Bed Mobility Overal bed mobility: Needs Assistance Bed Mobility: Supine to Sit     Supine to sit: HOB elevated;Modified independent (Device/Increase time)        Transfers Overall transfer level: Needs assistance Equipment used: None Transfers: Sit to/from Stand Sit to Stand: Supervision         General transfer comment: supervision for safety, no loss of balance  Ambulation/Gait Ambulation/Gait assistance: Supervision Gait Distance (Feet): 150 Feet Assistive device: None Gait Pattern/deviations: Decreased stride length;Step-through pattern;Decreased weight shift to left Gait velocity: WFL   General Gait Details: pt reports weak L LE from laying in bed; no loss of balance, VSS, no dyspnea   Stairs             Wheelchair Mobility    Modified Rankin (Stroke Patients Only)       Balance Overall balance assessment: Mild deficits observed, not formally tested                                          Cognition  Arousal/Alertness: Awake/alert Behavior During Therapy: WFL for tasks assessed/performed Overall Cognitive Status: Within Functional Limits for tasks assessed                                        Exercises      General Comments        Pertinent Vitals/Pain Pain Assessment: No/denies pain    Home Living                      Prior Function            PT Goals (current goals can now be found in the care plan section) Acute Rehab PT Goals PT Goal Formulation: With patient Time For Goal Achievement: 09/17/18 Potential to Achieve Goals: Good Progress towards PT goals: Progressing toward goals    Frequency    Min 3X/week      PT Plan Current plan remains appropriate    Co-evaluation              AM-PAC PT "6 Clicks" Mobility   Outcome Measure  Help needed turning from your back to your side while in a flat bed without using bedrails?: None Help needed moving from lying on your back to sitting on the side of a flat bed without using bedrails?: None Help needed moving to and  from a bed to a chair (including a wheelchair)?: None Help needed standing up from a chair using your arms (e.g., wheelchair or bedside chair)?: None Help needed to walk in hospital room?: None Help needed climbing 3-5 steps with a railing? : A Little 6 Click Score: 23    End of Session Equipment Utilized During Treatment: Gait belt Activity Tolerance: Patient tolerated treatment well Patient left: in chair;with call bell/phone within reach;with chair alarm set Nurse Communication: Mobility status(pt requests a wash up) PT Visit Diagnosis: Other abnormalities of gait and mobility (R26.89)     Time: 9211-9417 PT Time Calculation (min) (ACUTE ONLY): 20 min  Charges:  $Gait Training: 8-22 mins                     Blondell Reveal Kistler PT 09/05/2018  Acute Rehabilitation Services Pager [phone] Office [phone]

## 2018-09-05 NOTE — TOC Initial Note (Signed)
Transition of Care [facility]) - Initial/Assessment Note    Patient Details  Name: Stephanie Yates MRN: [id number] Date of Birth: 07-12-1945  Transition of Care [facility]) CM/SW Contact:    Wende Neighbors, LCSW Phone Number: 09/05/2018, 3:12 PM  Clinical Narrative:   CSW and RNCM met patient at bedside to discuss discharge plans. CSW stated to patient that physical therapy recommended she goes home with home health. Patient stated she does not home health in her home. Patient stated that her grandson lives next door and can help her when needed. Patient also stated that daughter lives around the corner and she also receives help from daughter when needed.  CSW spoke to patient about receiving a rolling walker. Patient stated she does not want a rolling walker and stated "theres no point in ordering it because I wont use it"         Expected Discharge Plan: [place] Barriers to Discharge: No Barriers Identified(pt refused DMEs and HH)   Patient Goals and CMS Choice Patient states their goals for this hospitalization and ongoing recovery are:: wants to go home    Choice offered to / list presented to : NA(pt denied)  Expected Discharge Plan and Services Expected Discharge Plan: [place] Choice: NA Living arrangements for the past 2 months: Single Family Home Expected Discharge Date: 09/03/18                        Prior Living Arrangements/Services Living arrangements for the past 2 months: Single Family Home Lives with:: Self Patient language and need for interpreter reviewed:: No Do you feel safe going back to the place where you live?: Yes        Care giver support system in place?: Yes (comment) Current home services: DME, Home PT, Home OT(pt refused)    Activities of Daily Living Home Assistive Devices/Equipment: None ADL Screening (condition at time of admission) Patient's cognitive ability adequate to safely complete  daily activities?: Yes Is the patient deaf or have difficulty hearing?: No Does the patient have difficulty seeing, even when wearing glasses/contacts?: No Does the patient have difficulty concentrating, remembering, or making decisions?: No Patient able to express need for assistance with ADLs?: Yes Does the patient have difficulty dressing or bathing?: No Independently performs ADLs?: Yes (appropriate for developmental age) Does the patient have difficulty walking or climbing stairs?: No Weakness of Legs: None Weakness of Arms/Hands: None  Permission Sought/Granted Permission sought to share information with : Family Supports Permission granted to share information with : Yes, Verbal Permission Granted  Share Information with NAME: Fuller Song     Permission granted to share info w Relationship: daughter  Permission granted to share info w Contact Information: [phone]  Emotional Assessment Appearance:: Appears stated age Attitude/Demeanor/Rapport: Engaged Affect (typically observed): Accepting Orientation: : Oriented to Self, Oriented to Place, Oriented to  Time, Oriented to Situation Alcohol / Substance Use: Not Applicable Psych Involvement: No (comment)  Admission diagnosis:  Acute pulmonary edema (HCC) [J81.0] AKI (acute kidney injury) ([place]) [N17.9] Influenza [J11.1] Patient Active Problem List   Diagnosis Date Noted  . Influenza A 09/02/2018  . Lactic acidosis 09/02/2018  . Chronic combined systolic and diastolic congestive heart failure ([place]) 09/02/2018  . Rapid atrial fibrillation ([place]) 06/08/2018  . Acute on chronic combined systolic and diastolic heart failure ([place]) 06/07/2018  . Anemia 06/07/2018  . Acute GI bleeding 06/15/2017  .  Chronic anticoagulation   . Acute on chronic congestive heart failure ([place])   . HLD (hyperlipidemia) 03/05/2017  . CKD (chronic kidney disease), stage III ([place]) 03/05/2017  . Chest pain 03/05/2017  . Symptomatic anemia  03/05/2017  . PAF (paroxysmal atrial fibrillation) ([place]) 04/01/2013  . ICD (implantable cardioverter-defibrillator), dual, [place] 04/01/2013  . PVC's (premature ventricular contractions) 04/01/2013  . Hypokalemia 12/21/2012  . History of tobacco abuse 12/21/2012  . Elevated troponin 12/21/2012  . Sinus bradycardia 12/21/2012  . Hyperkalemia 12/20/2012  . Nonischemic cardiomyopathy ([place]) 12/20/2012  . UTI (urinary tract infection) 12/20/2012  . Acute on chronic combined systolic and diastolic CHF (congestive heart failure) ([place]) 12/20/2012  . Ventricular tachycardia ([place]) 12/14/2012  . Hypertension 12/14/2012   PCP:  Josetta Huddle, MD Pharmacy:   [place], [address] [place] [address] Phone: [phone] Fax: [phone]  [facility] [phone] [place], [place] [address] [place] [address] Phone: [phone] Fax: [phone]     Social Determinants of Health (SDOH) Interventions    Readmission Risk Interventions 30 Day Unplanned Readmission Risk Score     ED to Hosp-Admission (Current) from 09/02/2018 in [place]  30 Day Unplanned Readmission Risk Score (%)  26 Filed at 09/05/2018 1200     This score is the patient's risk of an unplanned readmission within 30 days of being discharged (0 -100%). The score is based on dignosis, age, lab data, medications, orders, and past utilization.   Low:  0-14.9   Medium: 15-21.9   High: 22-29.9   Extreme: 30 and above       No flowsheet data found.

## 2018-09-06 LAB — BASIC METABOLIC PANEL
Anion gap: 5 (ref 5–15)
BUN: 60 mg/dL — ABNORMAL HIGH (ref 8–23)
CALCIUM: 8.1 mg/dL — AB (ref 8.9–10.3)
CO2: 24 mmol/L (ref 22–32)
Chloride: 109 mmol/L (ref 98–111)
Creatinine, Ser: 1.77 mg/dL — ABNORMAL HIGH (ref 0.44–1.00)
GFR calc Af Amer: 32 mL/min — ABNORMAL LOW (ref >60)
GFR calc non Af Amer: 28 mL/min — ABNORMAL LOW (ref >60)
Glucose, Bld: 133 mg/dL — ABNORMAL HIGH (ref 70–99)
Potassium: 4.2 mmol/L (ref 3.5–5.1)
SODIUM: 138 mmol/L (ref 135–145)

## 2018-09-06 MED ORDER — PREDNISONE 10 MG PO TABS: ORAL_TABLET | ORAL | 0 refills | Status: DC

## 2018-09-06 MED ORDER — OSELTAMIVIR PHOSPHATE 30 MG PO CAPS: 30.0000 mg | ORAL_CAPSULE | ORAL | 0 refills | Status: DC

## 2018-09-06 NOTE — Discharge Summary (Signed)
Physician Discharge Summary  Stephanie Yates:[id number] DOB: [birth year] DOA: 09/02/2018  PCP: Josetta Huddle, MD  Admit date: 09/02/2018 Discharge date: 09/06/2018  Admitted From: Home Disposition: Home Recommendations for Outpatient Follow-up:  1. Follow up with PCP in 1-2 weeks 2. Please obtain BMP/CBC in one week  Home Health yes Equipment/Devices none Discharge Condition: Stable and improved CODE STATUS: Full code Diet recommendation: Cardiac Brief/Interim Summary:73 y.o.femalewith medical history significant of CKD3 , chronic systolic CHF with LVEF 63-78%, nonischemic cardiomyopathy, history of tobacco abuse, hyperlipidemia, hypertension, sinus bradycardia/Vtach post pacemaker placement who is coming to the emergency department due toa 2-day history of progressively worseningshortness of breathassociated with fever, fatigue, malaise, decreased appetite, nausea, 2 episodes of loose stools, sore throat, rhinorrhea, productive cough and wheezing. She denies travel history or sick contacts. No chest pain, palpitations, dizziness, diaphoresis, pitting edema of the lower extremities, abdominal pain, constipation, melena or hematochezia. No dysuria, frequency or hematuria.  Influenza a and B by PCR was positive for influenza A. BNP was 819.2 pg/mL. Her chest radiograph shows cardiomegaly. There is no pleural effusion. There is mild pulmonary edema. TRH asked to admit.   Discharge Diagnoses:  Principal Problem:   Influenza A Active Problems:   Ventricular tachycardia (HCC)   Hypertension   PAF (paroxysmal atrial fibrillation) (HCC)   HLD (hyperlipidemia)   CKD (chronic kidney disease), stage III (HCC)   Anemia   Lactic acidosis   Chronic combined systolic and diastolic congestive heart failure (HCC) Influenza A infection Presented with T-max 100.8, tachypnea with respiration rate of 25, and positive influenza A PCR patient was treated with Tamiflu.  She will be discharged  on Tamiflu for an extra day.  She was also treated with steroids IV and nebulizer treatments.  She will also be discharged on prednisone tapering dose.  AKI on CKD 3 resolved.  Chronic combined systolic and diastolic CHF Last 2D echo done on 06/07/2018 revealed LVEF 20 to 25% with severe global hypokinesis with inferior, apical and lateral akinesis Previous 2D echo had revealed grade 1 diastolic dysfunction Resumed Lasix 80 mg daily  Chronic microcytic anemia/anemia of chronic disease from CKD stable.  Ventricular tachycardia ([place]) post pacemaker placement Continue amiodarone 200 mg po daily.  Continue carvedilol. Follow up  with Dr. Caryl Comes outpatient  Hypertension Blood pressure is normotensive Continue carvedilol3.125 mg p.o. twice daily and furosemide  PAF (paroxysmal atrial fibrillation) ([place]) CHA2DS2-VASc Scoreof at least 4. Continue anticoagulationwith eliquis. Continuecarvedilol for rate control Continue amiodarone for rhythm control Follows with Dr. Caryl Comes outpatient  HLD (hyperlipidemia) Continue atorvastatin 40 mg po daily. Normal LFTs Fasting lipid follow up as an outpatient.  Generalized weakness/physical debility  PTrecommended home health PT with DME 5 inches wheels rolling walker Fall precautions Out of bed to chair with every shift as tolerated  Moderate protein calorie malnutrition Albumin 2.5 with BMI of 20 Encourage increasing protein calorie intake  Estimated body mass index is 20.99 kg/m as calculated from the following:   Height as of this encounter: 5\' 6"  (1.676 m).   Weight as of this encounter: 59 kg.  Discharge Instructions  Discharge Instructions    Call MD for:  difficulty breathing, headache or visual disturbances   Complete by:  As directed    Diet - low sodium heart healthy   Complete by:  As directed    Increase activity slowly   Complete by:  As directed      Allergies as of 09/06/2018      Reactions  Strawberry  Extract Hives, Itching, Swelling, Other (See Comments)   Hives and itching      Medication List    TAKE these medications   acetaminophen 500 MG tablet Commonly known as:  TYLENOL Take 1,000 mg by mouth every 6 (six) hours as needed for moderate pain.   amiodarone 200 MG tablet Commonly known as:  PACERONE Take 1 tablet (200 mg total) by mouth daily.   atorvastatin 40 MG tablet Commonly known as:  LIPITOR Take 1 tablet (40 mg total) by mouth daily at 6 PM.   carvedilol 3.125 MG tablet Commonly known as:  COREG Take 1 tablet (3.125 mg total) by mouth 2 (two) times daily with a meal.   Eliquis 2.5 MG Tabs tablet Generic drug:  apixaban TAKE 1 TABLET BY MOUTH TWICE DAILY   ferrous gluconate 324 MG tablet Commonly known as:  FERGON Take 1 tablet by mouth 2 (two) times daily.   furosemide 80 MG tablet Commonly known as:  LASIX Take 1 tablet (80 mg total) by mouth daily.   magnesium oxide 400 (241.3 Mg) MG tablet Commonly known as:  MAG-OX Take 1 tablet (400 mg total) by mouth daily.   oseltamivir 30 MG capsule Commonly known as:  TAMIFLU Take 1 capsule (30 mg total) by mouth daily.   pantoprazole 40 MG tablet Commonly known as:  PROTONIX Take 1 tablet (40 mg total) by mouth 2 (two) times daily before a meal.   potassium chloride SA 20 MEQ tablet Commonly known as:  K-DUR,KLOR-CON TAKE 1 TABLET BY MOUTH EVERY DAY   VITAMIN D-3 PO Take 1,000 Units by mouth at bedtime.            Durable Medical Equipment  (From admission, onward)         Start     Ordered   09/03/18 1516  For home use only DME Walker rolling  Once    Comments:  DME rolling walker 5 inches wheels  Question:  Patient needs a walker to treat with the following condition  Answer:  Ambulatory dysfunction   09/03/18 1517         Follow-up Information    Josetta Huddle, MD Follow up.   Specialty:  Internal Medicine Contact information: [address] [place]  [address] [phone]          Allergies  Allergen Reactions  . Strawberry Extract Hives, Itching, Swelling and Other (See Comments)    Hives and itching     Consultations:  None   Procedures/Studies: Dg Chest Port 1 View  Result Date: 09/02/2018 CLINICAL DATA:  Cough and dyspnea EXAM: PORTABLE CHEST 1 VIEW COMPARISON:  06/07/2018 chest radiograph. FINDINGS: Two lead left subclavian pacemaker is stable in configuration. Stable cardiomediastinal silhouette with moderate cardiomegaly. No pneumothorax. No pleural effusion. Mild pulmonary edema. IMPRESSION: Mild congestive heart failure. Electronically Signed   By: Ilona Sorrel M.D.   On: 09/02/2018 16:22    (Echo, Carotid, EGD, Colonoscopy, ERCP)    Subjective: Patient resting in bed she feels she is back to her baseline denies any new complaints feels breathing is easier and better  Discharge Exam: Vitals:   09/05/18 2044 09/06/18 0456  BP:  122/74  Pulse:  70  Resp:  16  Temp:  98.1 F (36.7 C)  SpO2: 97% 92%   Vitals:   09/05/18 1447 09/05/18 2040 09/05/18 2044 09/06/18 0456  BP:  (!) 141/83  122/74  Pulse:  72  70  Resp:  16  16  Temp:  98.1 F (36.7 C)  98.1 F (36.7 C)  TempSrc:  Oral  Oral  SpO2: 96% 92% 97% 92%  Weight:      Height:        General: Pt is alert, awake, not in acute distress Cardiovascular: RRR, S1/S2 +, no rubs, no gallops Respiratory: Minimal wheezing bilaterally, no wheezing, no rhonchi Abdominal: Soft, NT, ND, bowel sounds + Extremities: no edema, no cyanosis    The results of significant diagnostics from this hospitalization (including imaging, microbiology, ancillary and laboratory) are listed below for reference.     Microbiology: No results found for this or any previous visit (from the past 240 hour(s)).   Labs: BNP (last 3 results) Recent Labs    06/07/18 0454 09/02/18 1541  BNP 1,111.3* 032.1*   Basic Metabolic Panel: Recent Labs  Lab 09/02/18 1541  09/03/18 0455 09/04/18 0534 09/06/18 0606  NA 136 138 136 138  K 3.8 4.1 4.5 4.2  CL 106 108 107 109  CO2 22 23 22 24   GLUCOSE 132* 128* 123* 133*  BUN 29* 32* 45* 60*  CREATININE 2.14* 1.98* 1.67* 1.77*  CALCIUM 8.8* 8.4* 8.4* 8.1*  MG 1.9  --   --   --   PHOS 2.6  --   --   --    Liver Function Tests: Recent Labs  Lab 09/03/18 0455  AST 56*  ALT 26  ALKPHOS 71  BILITOT 0.7  PROT 7.1  ALBUMIN 2.5*   No results for input(s): LIPASE, AMYLASE in the last 168 hours. No results for input(s): AMMONIA in the last 168 hours. CBC: Recent Labs  Lab 09/02/18 1541 09/03/18 0455  WBC 5.2 4.9  NEUTROABS 3.1 3.4  HGB 9.1* 8.5*  HCT 30.8* 29.1*  MCV 74.9* 76.2*  PLT 197 169   Cardiac Enzymes: No results for input(s): CKTOTAL, CKMB, CKMBINDEX, TROPONINI in the last 168 hours. BNP: Invalid input(s): POCBNP CBG: No results for input(s): GLUCAP in the last 168 hours. D-Dimer No results for input(s): DDIMER in the last 72 hours. Hgb A1c No results for input(s): HGBA1C in the last 72 hours. Lipid Profile No results for input(s): CHOL, HDL, LDLCALC, TRIG, CHOLHDL, LDLDIRECT in the last 72 hours. Thyroid function studies No results for input(s): TSH, T4TOTAL, T3FREE, THYROIDAB in the last 72 hours.  Invalid input(s): FREET3 Anemia work up No results for input(s): VITAMINB12, FOLATE, FERRITIN, TIBC, IRON, RETICCTPCT in the last 72 hours. Urinalysis    Component Value Date/Time   COLORURINE YELLOW 08/29/2014 2035   APPEARANCEUR CLOUDY (A) 08/29/2014 2035   LABSPEC 1.017 08/29/2014 2035   PHURINE 6.0 08/29/2014 2035   GLUCOSEU NEGATIVE 08/29/2014 2035   HGBUR SMALL (A) 08/29/2014 2035   BILIRUBINUR NEGATIVE 08/29/2014 2035   KETONESUR NEGATIVE 08/29/2014 2035   PROTEINUR NEGATIVE 08/29/2014 2035   UROBILINOGEN 1.0 08/29/2014 2035   NITRITE NEGATIVE 08/29/2014 2035   LEUKOCYTESUR TRACE (A) 08/29/2014 2035   Sepsis Labs Invalid input(s): PROCALCITONIN,  WBC,   LACTICIDVEN Microbiology No results found for this or any previous visit (from the past 240 hour(s)).   Time coordinating discharge: 33 minutes  SIGNED:   Georgette Shell, MD  [facility] 09/06/2018, 10:22 AM Pager   If 7PM-7AM, please contact night-coverage [email or web address] Password [email or web address]

## 2018-09-06 NOTE — Progress Notes (Signed)
Gardiner Ramus discharged Home per MD order.  Discharge instructions reviewed and discussed with the patient, all questions and concerns answered. Copy of instructions and scripts given to patient.  Allergies as of 09/06/2018      Reactions   Strawberry Extract Hives, Itching, Swelling, Other (See Comments)   Hives and itching      Medication List    TAKE these medications   acetaminophen 500 MG tablet Commonly known as:  TYLENOL Take 1,000 mg by mouth every 6 (six) hours as needed for moderate pain.   amiodarone 200 MG tablet Commonly known as:  PACERONE Take 1 tablet (200 mg total) by mouth daily.   atorvastatin 40 MG tablet Commonly known as:  LIPITOR Take 1 tablet (40 mg total) by mouth daily at 6 PM.   carvedilol 3.125 MG tablet Commonly known as:  COREG Take 1 tablet (3.125 mg total) by mouth 2 (two) times daily with a meal.   Eliquis 2.5 MG Tabs tablet Generic drug:  apixaban TAKE 1 TABLET BY MOUTH TWICE DAILY   ferrous gluconate 324 MG tablet Commonly known as:  FERGON Take 1 tablet by mouth 2 (two) times daily.   furosemide 80 MG tablet Commonly known as:  LASIX Take 1 tablet (80 mg total) by mouth daily.   magnesium oxide 400 (241.3 Mg) MG tablet Commonly known as:  MAG-OX Take 1 tablet (400 mg total) by mouth daily.   oseltamivir 30 MG capsule Commonly known as:  TAMIFLU Take 1 capsule (30 mg total) by mouth daily.   pantoprazole 40 MG tablet Commonly known as:  PROTONIX Take 1 tablet (40 mg total) by mouth 2 (two) times daily before a meal.   potassium chloride SA 20 MEQ tablet Commonly known as:  K-DUR,KLOR-CON TAKE 1 TABLET BY MOUTH EVERY DAY   predniSONE 10 MG tablet Commonly known as:  DELTASONE Take 4 tablets for 2 days then 3 tablets for 2 days then 2 tablets for 2 days and then 1 tablet daily till done   VITAMIN D-3 PO Take 1,000 Units by mouth at bedtime.            Durable Medical Equipment  (From admission, onward)        Start     Ordered   09/06/18 0000  For home use only DME 4 wheeled rolling walker with seat    Question:  Patient needs a walker to treat with the following condition  Answer:  Congestive heart failure (CHF) ([place])   09/06/18 1034   09/03/18 1516  For home use only DME Walker rolling  Once    Comments:  DME rolling walker 5 inches wheels  Question:  Patient needs a walker to treat with the following condition  Answer:  Ambulatory dysfunction   09/03/18 1517          Patients skin is clean, dry and intact, no evidence of skin break down. IV site discontinued and catheter remains intact. Site without signs and symptoms of complications. Dressing and pressure applied.  Patient escorted to car in a wheelchair,  no distress noted upon discharge.  Ralene Muskrat Layah Skousen 09/06/2018 3:52 PM

## 2018-09-12 ENCOUNTER — Telehealth: Payer: Self-pay

## 2018-09-12 MED ORDER — CARVEDILOL 3.125 MG PO TABS: 3.1250 mg | ORAL_TABLET | Freq: Two times a day (BID) | ORAL | 2 refills | Status: DC

## 2018-09-12 MED ORDER — POTASSIUM CHLORIDE CRYS ER 20 MEQ PO TBCR: 20.0000 meq | EXTENDED_RELEASE_TABLET | Freq: Every day | ORAL | 3 refills | Status: DC

## 2018-09-12 NOTE — Telephone Encounter (Signed)
Katrina, RN from Dr Inda Merlin office called to discuss pt. I confirmed pt is scheduled to see them on 3/23 for hospital follow up for Flu/Respiratory distress. Stephanie Yates and I both agree pt should follow up with her PCP per her discharge instructions. She stated she will be calling to check on Stephanie Yates as well.   I called and spoke with pt to assess her needs, primarily cardiac needs. She states she has not been bothered with her A fib. She denies chest pain, shortness of breath, nocturnal dyspnea, orthopnea or peripheral edema.  There have been no palpitations, lightheadedness or syncope.  While pt does need hospital follow up, she may be better suited to follow up with her PCP given her recent hospitalization versus following up with Dr Caryl Comes. She has agreed to keep her appt with her PCP next week. She agrees to cancel her appointment with Dr Caryl Comes and follow up as planned in August. In the meantime, she will call us with her needs. Refills have also been sent it as requested.

## 2018-09-12 NOTE — Telephone Encounter (Signed)
LVM with pt's primary care office to determine if she has been or will be seen for hopsital follow up. Pt's current appointment was scheduled for a previous hospital visit and may not be necessary if pt follows up with her PCP.

## 2018-09-19 ENCOUNTER — Encounter: Payer: Medicare Other | Admitting: Internal Medicine

## 2018-10-09 ENCOUNTER — Telehealth: Payer: Self-pay | Admitting: Internal Medicine

## 2018-10-09 NOTE — Telephone Encounter (Signed)
° °  Patient requesting letter for employer stating she can not work due to heart diagnosis/ covid 19 risk

## 2018-10-09 NOTE — Telephone Encounter (Signed)
LVM for return call. 

## 2018-10-09 NOTE — Telephone Encounter (Signed)
Follow Up:; ° ° °Returning your call. °

## 2018-10-09 NOTE — Telephone Encounter (Signed)
A letter was written for pt stating she is in an increased risk group for COVID-19. Claiborne Billings, RN who is at the office this week, will print the letter and place it at the front desk for the pt's daughter to pick up.   Pt will call if she needs anything additional.

## 2018-12-17 ENCOUNTER — Telehealth: Payer: Self-pay | Admitting: Internal Medicine

## 2018-12-17 NOTE — Telephone Encounter (Signed)
New Message:    Patient daughter calling stating that MS had to be called today and stated that she needed to call the office and be seen. Please call the daughter back.

## 2018-12-18 NOTE — Telephone Encounter (Signed)
Spoke with Magda Paganini, patient's daughter. She reports pt suddenly felt lightheaded, heard a "pop" and felt like she got "zapped" yesterday morning between 8:30-9:00am. EMS was called, performed EKG and vitals and told her everything looked okay, but that she should f/u with our office. Pt is overdue for f/u, does not currently have a home monitor. Magda Paganini reports pt told her she is compliant with medications. Pt no longer drives.   Magda Paganini agreeable to appointment tomorrow, 12/19/18 at 8:20am with Chanetta Marshall, NP. She is agreeable to receiving a new home monitor at this appointment. No further questions at this time.      COVID-19 Pre-Screening Questions:  . In the past 7 to 10 days have you had a cough,  shortness of breath, headache, congestion, fever (100 or greater) body aches, chills, sore throat, or sudden loss of taste or sense of smell? . Have you been around anyone with known Covid 19? Marland Kitchen Have you been around anyone who is awaiting Covid 19 test results in the past 7 to 10 days? . Have you been around anyone who has been exposed to Covid 19, or has mentioned symptoms of Covid 19 within the past 7 to 10 days?  If you have any concerns/questions about symptoms patients report during screening (either on the phone or at threshold). Contact the provider seeing the patient or DOD for further guidance.  If neither are available contact a member of the leadership team.   Pt's daughter answered "no" to all questions. Aware to have pt wear her own mask. Pt's daughter will wait in the car during the visit.

## 2018-12-18 NOTE — Telephone Encounter (Signed)
LVM for return call. 

## 2018-12-18 NOTE — Telephone Encounter (Signed)
Pts daughter calling to let us know her mother was shocked by her ICD yesterday while snapping beans. They called EMS who preformed an EKG on her and noted "her heart was ok" per pt's daughter.   Pt's daughter states her mother is doing fine and is not experiencing any issues since her shock. I advised her I would forward to device clinic for review and recommendation.

## 2018-12-18 NOTE — Telephone Encounter (Signed)
noted 

## 2018-12-18 NOTE — Progress Notes (Signed)
Electrophysiology Office Note Date: 12/20/2018  ID:  Stephanie Yates, Stephanie Yates 11-Oct-1945, MRN [id number]  PCP: Josetta Huddle, MD Electrophysiologist: Caryl Comes  CC: Routine ICD follow-up  Stephanie Yates is a 73 y.o. female seen today for Dr Caryl Comes.  She presents today for routine electrophysiology followup.  Since last being seen in our clinic, the patient reports doing relatively well. She was sitting on her porch snapping beans earlier this week when she became dizzy and subsequently had an ICD shock. She felt better after the shock. EMS was called and she was advised to follow up in the office.  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 She denies chest pain, dyspnea, PND, orthopnea, nausea, vomiting, syncope, edema, weight gain, or early satiety.   Device History: STJ dual chamber ICD implanted 2014 for secondary prevention History of appropriate therapy: Yes History of AAD therapy: Yes - amiodarone    Past Medical History:  Diagnosis Date  . Chronic renal insufficiency, stage III (moderate) (HCC)    Cr 2.0 5/15  . Chronic systolic CHF (congestive heart failure) ([place])   . History of tobacco abuse   . Hyperlipidemia   . Hypertension   . Nonischemic cardiomyopathy ([place])    EF 10-15% on 11/2012 cath  . Sinus bradycardia 12/21/2012  . Ventricular tachycardia ([place]) 12/14/2012   Past Surgical History:  Procedure Laterality Date  . ESOPHAGOGASTRODUODENOSCOPY (EGD) WITH PROPOFOL N/A 06/18/2017   Procedure: ESOPHAGOGASTRODUODENOSCOPY (EGD) WITH PROPOFOL;  Surgeon: Ronnette Juniper, MD;  Location: [facility];  Service: Gastroenterology;  Laterality: N/A;  . IMPLANTABLE CARDIOVERTER DEFIBRILLATOR IMPLANT  12/19/2012   St. Jude dual-chamber ICD, serial number [id number]  . IMPLANTABLE CARDIOVERTER DEFIBRILLATOR IMPLANT N/A 12/19/2012   Procedure: IMPLANTABLE CARDIOVERTER DEFIBRILLATOR IMPLANT;  Surgeon: Deboraha Sprang, MD;  [facility];  Service: Cardiovascular;  Laterality: N/A;   . LEFT AND RIGHT HEART CATHETERIZATION WITH CORONARY ANGIOGRAM N/A 12/17/2012   Procedure: LEFT AND RIGHT HEART CATHETERIZATION WITH CORONARY ANGIOGRAM;  Surgeon: Sherren Mocha, MD;  Location: [facility] CATH LAB;  Service: Cardiovascular;  Laterality: N/A;    Current Outpatient Medications  Medication Sig Dispense Refill  . acetaminophen (TYLENOL) 500 MG tablet Take 1,000 mg by mouth every 6 (six) hours as needed for moderate pain.    Marland Kitchen atorvastatin (LIPITOR) 40 MG tablet Take 1 tablet (40 mg total) by mouth daily at 6 PM. 30 tablet 0  . carvedilol (COREG) 3.125 MG tablet Take 1 tablet (3.125 mg total) by mouth 2 (two) times daily with a meal. 180 tablet 2  . Cholecalciferol (VITAMIN D-3 PO) Take 1,000 Units by mouth at bedtime.     Marland Kitchen ELIQUIS 2.5 MG TABS tablet TAKE 1 TABLET BY MOUTH TWICE DAILY 60 tablet 5  . ferrous gluconate (FERGON) 324 MG tablet Take 1 tablet by mouth 2 (two) times daily.    . furosemide (LASIX) 80 MG tablet Take 1 tablet (80 mg total) by mouth daily. 90 tablet 2  . magnesium oxide (MAG-OX) 400 (241.3 Mg) MG tablet Take 1 tablet (400 mg total) by mouth daily. 90 tablet 1  . oseltamivir (TAMIFLU) 30 MG capsule Take 1 capsule (30 mg total) by mouth daily. 2 capsule 0  . pantoprazole (PROTONIX) 40 MG tablet Take 1 tablet (40 mg total) by mouth 2 (two) times daily before a meal. 120 tablet 0  . potassium chloride SA (K-DUR,KLOR-CON) 20 MEQ tablet Take 1 tablet (20 mEq total) by mouth daily. 90 tablet 3  . predniSONE (DELTASONE)  10 MG tablet Take 4 tablets for 2 days then 3 tablets for 2 days then 2 tablets for 2 days and then 1 tablet daily till done 20 tablet 0  . amiodarone (PACERONE) 200 MG tablet Take 1 tablet (200 mg total) by mouth 2 (two) times daily. 90 tablet 3   No current facility-administered medications for this visit.     Allergies:   Strawberry extract   Social History: Social History   Socioeconomic History  . Marital status: Widowed    Spouse name: Not on  file  . Number of children: Not on file  . Years of education: Not on file  . Highest education level: Not on file  Occupational History  . Not on file  Social Needs  . Financial resource strain: Not on file  . Food insecurity    Worry: Not on file    Inability: Not on file  . Transportation needs    Medical: Not on file    Non-medical: Not on file  Tobacco Use  . Smoking status: Former Research scientist (life sciences)  . Smokeless tobacco: Never Used  Substance and Sexual Activity  . Alcohol use: No  . Drug use: No  . Sexual activity: Not Currently  Lifestyle  . Physical activity    Days per week: Not on file    Minutes per session: Not on file  . Stress: Not on file  Relationships  . Social Herbalist on phone: Not on file    Gets together: Not on file    Attends religious service: Not on file    Active member of club or organization: Not on file    Attends meetings of clubs or organizations: Not on file    Relationship status: Not on file  . Intimate partner violence    Fear of current or ex partner: Not on file    Emotionally abused: Not on file    Physically abused: Not on file    Forced sexual activity: Not on file  Other Topics Concern  . Not on file  Social History Narrative  . Not on file    Family History: Family History  Problem Relation Age of Onset  . Cancer Mother     Review of Systems: All other systems reviewed and are otherwise negative except as noted above.   Physical Exam: VS:  BP 126/70   Pulse 71   Ht 5\' 6"  (1.676 m)   Wt 145 lb 12.8 oz (66.1 kg)   SpO2 95%   BMI 23.53 kg/m  , BMI Body mass index is 23.53 kg/m.  GEN- The patient is well appearing, alert and oriented x 3 today.   HEENT: normocephalic, atraumatic; sclera clear, conjunctiva pink; hearing intact; oropharynx clear; neck supple  Lungs- Clear to ausculation bilaterally, normal work of breathing.  No wheezes, rales, rhonchi Heart- Regular rate and rhythm  GI- soft, non-tender,  non-distended, bowel sounds present  Extremities- no clubbing, cyanosis, or edema  MS- no significant deformity or atrophy Skin- warm and dry, no rash or lesion; ICD pocket well healed Psych- euthymic mood, full affect Neuro- strength and sensation are intact  ICD interrogation- reviewed in detail today,  See PACEART report   Recent Labs: 09/02/2018: B Natriuretic Peptide 819.2; Magnesium 1.9 12/19/2018: ALT 23; BUN 27; Creatinine, Ser 1.77; Hemoglobin 11.4; Platelets 226; Potassium 4.6; Sodium 135; TSH 2.480   Wt Readings from Last 3 Encounters:  12/19/18 145 lb 12.8 oz (66.1 kg)  09/02/18 130 lb  1.1 oz (59 kg)  06/13/18 131 lb 12.8 oz (59.8 kg)     Other studies Reviewed: Additional studies/ records that were reviewed today include: Dr Olin Pia office notes, hospital records    Assessment and Plan:  1.  Chronic systolic dysfunction euvolemic today Stable on an appropriate medical regimen Normal ICD function See Pace Art report No changes today  2.  VT She has had several episodes, the longest of which lasted 83 minutes in April that was hovering around detection. Episode from this week was appropriately detected and treated with HV therapy after ATP failed Increase amiodarone to 200mg  twice daily today VT detection lowered today  Keep K>3.9, Mg >1.8 Will update amio labs No driving (pt aware) Merlin home transmitter given to patient today  3.  Paroxysmal atrial fibrillation Burden by device interrogation low Continue Eliquis for CHADS2VASC of 5, dose adjusted  4.  CKD, stage IV Stable No change required today    Current medicines are reviewed at length with the patient today.   The patient does not have concerns regarding her medicines.  The following changes were made today:  none  Labs/ tests ordered today include: BMET, CBC, TSH, FT4, LFTs Orders Placed This Encounter  Procedures  . Basic Metabolic Panel (BMET)  . CBC  . TSH  . T4, free  . Hepatic  function panel  . EKG 12-Lead     Disposition:   Follow up with Delilah Shan, Dr Caryl Comes 6 weeks   Signed, Chanetta Marshall, NP 12/20/2018 8:44 AM  [address] [place] [address] [phone] (office) [phone] (fax)

## 2018-12-18 NOTE — Telephone Encounter (Signed)
F/U Message            Patient's daughter is returning Lorren's call, would like a call back.

## 2018-12-19 ENCOUNTER — Other Ambulatory Visit: Payer: Self-pay

## 2018-12-19 ENCOUNTER — Encounter: Payer: Self-pay | Admitting: Nurse Practitioner

## 2018-12-19 ENCOUNTER — Ambulatory Visit (INDEPENDENT_AMBULATORY_CARE_PROVIDER_SITE_OTHER): Payer: Medicare Other | Admitting: Nurse Practitioner

## 2018-12-19 VITALS — BP 126/70 | HR 71 | Ht 66.0 in | Wt 145.8 lb

## 2018-12-19 DIAGNOSIS — I480 Paroxysmal atrial fibrillation: Secondary | ICD-10-CM | POA: Diagnosis not present

## 2018-12-19 DIAGNOSIS — I472 Ventricular tachycardia, unspecified: Secondary | ICD-10-CM

## 2018-12-19 DIAGNOSIS — I5022 Chronic systolic (congestive) heart failure: Secondary | ICD-10-CM

## 2018-12-19 DIAGNOSIS — N184 Chronic kidney disease, stage 4 (severe): Secondary | ICD-10-CM

## 2018-12-19 LAB — CBC
Hematocrit: 36.4 % (ref 34.0–46.6)
Hemoglobin: 11.4 g/dL (ref 11.1–15.9)
MCH: 23.2 pg — ABNORMAL LOW (ref 26.6–33.0)
MCHC: 31.3 g/dL — ABNORMAL LOW (ref 31.5–35.7)
MCV: 74 fL — ABNORMAL LOW (ref 79–97)
Platelets: 226 10*3/uL (ref 150–450)
RBC: 4.91 x10E6/uL (ref 3.77–5.28)
RDW: 16.6 % — ABNORMAL HIGH (ref 11.7–15.4)
WBC: 4.9 10*3/uL (ref 3.4–10.8)

## 2018-12-19 LAB — HEPATIC FUNCTION PANEL
ALT: 23 IU/L (ref 0–32)
AST: 66 IU/L — ABNORMAL HIGH (ref 0–40)
Albumin: 3.8 g/dL (ref 3.7–4.7)
Alkaline Phosphatase: 134 IU/L — ABNORMAL HIGH (ref 39–117)
Bilirubin Total: 0.9 mg/dL (ref 0.0–1.2)
Bilirubin, Direct: 0.42 mg/dL — ABNORMAL HIGH (ref 0.00–0.40)
Total Protein: 7.5 g/dL (ref 6.0–8.5)

## 2018-12-19 LAB — BASIC METABOLIC PANEL
BUN/Creatinine Ratio: 15 (ref 12–28)
BUN: 27 mg/dL (ref 8–27)
CO2: 21 mmol/L (ref 20–29)
Calcium: 9.4 mg/dL (ref 8.7–10.3)
Chloride: 100 mmol/L (ref 96–106)
Creatinine, Ser: 1.77 mg/dL — ABNORMAL HIGH (ref 0.57–1.00)
GFR calc Af Amer: 32 mL/min/{1.73_m2} — ABNORMAL LOW (ref >59)
GFR calc non Af Amer: 28 mL/min/{1.73_m2} — ABNORMAL LOW (ref >59)
Glucose: 111 mg/dL — ABNORMAL HIGH (ref 65–99)
Potassium: 4.6 mmol/L (ref 3.5–5.2)
Sodium: 135 mmol/L (ref 134–144)

## 2018-12-19 LAB — TSH: TSH: 2.48 u[IU]/mL (ref 0.450–4.500)

## 2018-12-19 LAB — T4, FREE: Free T4: 2.13 ng/dL — ABNORMAL HIGH (ref 0.82–1.77)

## 2018-12-19 MED ORDER — AMIODARONE HCL 200 MG PO TABS: 200.0000 mg | ORAL_TABLET | Freq: Two times a day (BID) | ORAL | 3 refills | Status: DC

## 2018-12-19 NOTE — Patient Instructions (Addendum)
Medication Instructions:  INCREASE AMIODARONE TO 200 mg TWICE DAILY If you need a refill on your cardiac medications before your next appointment, please call your pharmacy.   Lab work:TODAY BMET CBC TSH FREE T4 HEPATIC FUNCTION TEST If you have labs (blood work) drawn today and your tests are completely normal, you will receive your results only by: Marland Kitchen MyChart Message (if you have MyChart) OR . A paper copy in the mail If you have any lab test that is abnormal or we need to change your treatment, we will call you to review the results.  Testing/Procedures: NONE  Follow-Up: 6 WEEKS IN OFFICE Dr Caryl Comes At [facility], you and your health needs are our priority.  As part of our continuing mission to provide you with exceptional heart care, we have created designated Provider Care Teams.  These Care Teams include your primary Cardiologist (physician) and Advanced Practice Providers (APPs -  Physician Assistants and Nurse Practitioners) who all work together to provide you with the care you need, when you need it. .   Any Other Special Instructions Will Be Listed Below (If Applicable). Remote monitoring is used to monitor your  ICD from home. This monitoring reduces the number of office visits required to check your device to one time per year. It allows [place] to keep an eye on the functioning of your device to ensure it is working properly. You are scheduled for a device check from home on 9/23. You may send your transmission at any time that day. If you have a wireless device, the transmission will be sent automatically. After your physician reviews your transmission, you will receive a postcard with your next transmission date.

## 2018-12-20 ENCOUNTER — Ambulatory Visit (INDEPENDENT_AMBULATORY_CARE_PROVIDER_SITE_OTHER): Payer: Medicare Other | Admitting: *Deleted

## 2018-12-20 ENCOUNTER — Telehealth: Payer: Self-pay | Admitting: Internal Medicine

## 2018-12-20 DIAGNOSIS — I428 Other cardiomyopathies: Secondary | ICD-10-CM

## 2018-12-20 LAB — CUP PACEART INCLINIC DEVICE CHECK
Battery Remaining Longevity: 33 mo
Brady Statistic RA Percent Paced: 97 %
Brady Statistic RV Percent Paced: 5.3 %
Date Time Interrogation Session: 20200624122604
HighPow Impedance: 58.5 Ohm
Implantable Lead Implant Date: 20140625
Implantable Lead Implant Date: 20140625
Implantable Lead Location: 753859
Implantable Lead Location: 753860
Implantable Pulse Generator Implant Date: 20140625
Lead Channel Impedance Value: 387.5 Ohm
Lead Channel Impedance Value: 600 Ohm
Lead Channel Pacing Threshold Amplitude: 0.75 V
Lead Channel Pacing Threshold Amplitude: 0.75 V
Lead Channel Pacing Threshold Amplitude: 1.625 V
Lead Channel Pacing Threshold Pulse Width: 0.5 ms
Lead Channel Pacing Threshold Pulse Width: 0.5 ms
Lead Channel Pacing Threshold Pulse Width: 0.6 ms
Lead Channel Sensing Intrinsic Amplitude: 12 mV
Lead Channel Sensing Intrinsic Amplitude: 2.7 mV
Lead Channel Setting Pacing Amplitude: 1.875
Lead Channel Setting Pacing Amplitude: 2 V
Lead Channel Setting Pacing Pulse Width: 0.6 ms
Lead Channel Setting Sensing Sensitivity: 0.5 mV
Pulse Gen Serial Number: 7093938

## 2018-12-20 NOTE — Telephone Encounter (Signed)
New message:     Patient calling concering that they had to call [facility] again yesterday. They told the patient to call to see if it is her medication. Patient is dizzy.

## 2018-12-20 NOTE — Telephone Encounter (Signed)
-----   Message from Patsey Berthold, NP sent at 12/20/2018  8:50 AM EDT ----- Please notify patient of stable labs. She will need repeat thyroid and liver function tests at follow up with Dr Caryl Comes.

## 2018-12-20 NOTE — Telephone Encounter (Signed)
  STAT if patient feels like he/she is going to faint   1) Are you dizzy now? no  2) Do you feel faint or have you passed out? no  3) Do you have any other symptoms? Short of breath when it happens  4) Have you checked your HR and BP (record if available)? No  Patient states dizziness comes and goes.

## 2018-12-20 NOTE — Telephone Encounter (Signed)
Discussed with Chanetta Marshall, NP. No changes for now as she just increased amio dose, continue amio 200mg  BID. Spoke with Magda Paganini, who verbalizes understanding. She is aware to call back if pt has any questions or concerns.

## 2018-12-20 NOTE — Telephone Encounter (Signed)
Transmission received. 1 treated VT episode today at 10:07, initially in VT-1 zone, accelerated to VT-2 zone with ATP x2, then accelerated to VF zone with ATP x3, converted with 30J shock to Ap/Vp rhythm.   Spoke with Magda Paganini, patient's daughter. She reports pt was cleaning her room, suddenly felt dizzy, but didn't feel shock. Denies syncope. EMS came, did vitals and EKG, told her everything looked okay. Currently feels fine, sitting comfortably in chair, denies cardiac symptoms per Magda Paganini. Magda Paganini notes pt won't turn on [facility] in house so it is very warm, stays hydrated. Encouraged pt to stay cool and hydrated.   Reports compliance with amiodarone--dose increased to 200mg  BID on 12/19/18, pt took dose last night and this morning. Reviewed shock plan with Magda Paganini, advised if another shock, pre-syncopal episode, or worsening cardiac symptoms, pt should call 911 for transport to [facility] ED. Pt no longer drives. Advised I will discuss with provider for recommendations. Magda Paganini in agreement with plan.

## 2018-12-20 NOTE — Telephone Encounter (Signed)
Noted  

## 2018-12-20 NOTE — Telephone Encounter (Signed)
I spoke to the patient's daughter Magda Paganini) and they are sending transmission.

## 2018-12-20 NOTE — Telephone Encounter (Signed)
I spoke to the patient and informed her that the medication change should help with her symptoms.  She will give it a few days and get back to [place].

## 2018-12-20 NOTE — Telephone Encounter (Signed)
Notes recorded by Frederik Schmidt, RN on 12/20/2018 at 9:13 AM EDT  The patient has been notified of the result and verbalized understanding. All questions (if any) were answered.  Frederik Schmidt, RN 12/20/2018 9:13 AM

## 2018-12-21 LAB — CUP PACEART REMOTE DEVICE CHECK
Battery Remaining Longevity: 31 mo
Battery Remaining Percentage: 39 %
Battery Voltage: 2.9 V
Brady Statistic AP VP Percent: 7.4 %
Brady Statistic AP VS Percent: 85 %
Brady Statistic AS VP Percent: 1 %
Brady Statistic AS VS Percent: 2.1 %
Brady Statistic RA Percent Paced: 89 %
Brady Statistic RV Percent Paced: 7.4 %
Date Time Interrogation Session: 20200625151208
HighPow Impedance: 59 Ohm
HighPow Impedance: 70 Ohm
Implantable Lead Implant Date: 20140625
Implantable Lead Implant Date: 20140625
Implantable Lead Location: 753859
Implantable Lead Location: 753860
Implantable Pulse Generator Implant Date: 20140625
Lead Channel Impedance Value: 410 Ohm
Lead Channel Impedance Value: 580 Ohm
Lead Channel Pacing Threshold Amplitude: 0.75 V
Lead Channel Pacing Threshold Amplitude: 1.875 V
Lead Channel Pacing Threshold Pulse Width: 0.5 ms
Lead Channel Pacing Threshold Pulse Width: 0.6 ms
Lead Channel Sensing Intrinsic Amplitude: 11.8 mV
Lead Channel Sensing Intrinsic Amplitude: 2.7 mV
Lead Channel Setting Pacing Amplitude: 2 V
Lead Channel Setting Pacing Amplitude: 2.125
Lead Channel Setting Pacing Pulse Width: 0.6 ms
Lead Channel Setting Sensing Sensitivity: 0.5 mV
Pulse Gen Serial Number: 7093938

## 2018-12-24 ENCOUNTER — Telehealth: Payer: Self-pay | Admitting: Student

## 2018-12-24 DIAGNOSIS — I472 Ventricular tachycardia, unspecified: Secondary | ICD-10-CM

## 2018-12-24 NOTE — Telephone Encounter (Signed)
  Pt had successful ATP on first attempt. Noted that V capture trend is unstable and we will re-assess when she comes back to clinic (Only RV paces 6%). This is currently scheduled for 8/11.

## 2018-12-26 NOTE — Telephone Encounter (Signed)
Recurrent VT following reinitiation of amiodarone Could you plz A have her increase her amio to 400 bid x 2 weeks then 200 bid x 2 weeks   Make sure she has TSH and LFTs for baseline thx SK

## 2018-12-27 MED ORDER — AMIODARONE HCL 200 MG PO TABS: ORAL_TABLET | ORAL | 0 refills | Status: DC

## 2018-12-27 NOTE — Telephone Encounter (Signed)
Follow up   Patient would like a call to discuss the instructions that were given per the previous message. Please call.

## 2018-12-27 NOTE — Telephone Encounter (Signed)
Discussed with patient. Per Dr Caryl Comes, will increase amiodarone to 400 mg BID x 2 weeks, then 200 mg BID x 2 weeks, and then continue with 200 mg daily.  Pt had no further questions. Medications sent to her pharmacy.    Legrand Como [address] [place], PA-C 12/27/2018 9:04 AM

## 2018-12-27 NOTE — Telephone Encounter (Signed)
Confirmed instructions with patient

## 2018-12-30 ENCOUNTER — Encounter: Payer: Self-pay | Admitting: Cardiology

## 2018-12-30 NOTE — Progress Notes (Signed)
Remote ICD transmission.   

## 2019-01-05 ENCOUNTER — Emergency Department (HOSPITAL_COMMUNITY): Payer: Medicare Other

## 2019-01-05 ENCOUNTER — Other Ambulatory Visit: Payer: Self-pay

## 2019-01-05 ENCOUNTER — Inpatient Hospital Stay (HOSPITAL_COMMUNITY)
Admission: EM | Admit: 2019-01-05 | Discharge: 2019-01-12 | DRG: 291 | Disposition: A | Discharge status: Home / Self Care | Payer: Medicare Other | Attending: Internal Medicine | Admitting: Internal Medicine

## 2019-01-05 DIAGNOSIS — D509 Iron deficiency anemia, unspecified: Secondary | ICD-10-CM | POA: Diagnosis present

## 2019-01-05 DIAGNOSIS — I447 Left bundle-branch block, unspecified: Secondary | ICD-10-CM | POA: Diagnosis present

## 2019-01-05 DIAGNOSIS — I130 Hypertensive heart and chronic kidney disease with heart failure and stage 1 through stage 4 chronic kidney disease, or unspecified chronic kidney disease: Secondary | ICD-10-CM | POA: Diagnosis not present

## 2019-01-05 DIAGNOSIS — I340 Nonrheumatic mitral (valve) insufficiency: Secondary | ICD-10-CM | POA: Diagnosis present

## 2019-01-05 DIAGNOSIS — Z1159 Encounter for screening for other viral diseases: Secondary | ICD-10-CM

## 2019-01-05 DIAGNOSIS — I428 Other cardiomyopathies: Secondary | ICD-10-CM

## 2019-01-05 DIAGNOSIS — I959 Hypotension, unspecified: Secondary | ICD-10-CM | POA: Diagnosis not present

## 2019-01-05 DIAGNOSIS — Z91018 Allergy to other foods: Secondary | ICD-10-CM

## 2019-01-05 DIAGNOSIS — Z7189 Other specified counseling: Secondary | ICD-10-CM

## 2019-01-05 DIAGNOSIS — I480 Paroxysmal atrial fibrillation: Secondary | ICD-10-CM | POA: Diagnosis present

## 2019-01-05 DIAGNOSIS — I472 Ventricular tachycardia: Secondary | ICD-10-CM | POA: Diagnosis not present

## 2019-01-05 DIAGNOSIS — Z87891 Personal history of nicotine dependence: Secondary | ICD-10-CM

## 2019-01-05 DIAGNOSIS — N184 Chronic kidney disease, stage 4 (severe): Secondary | ICD-10-CM | POA: Diagnosis present

## 2019-01-05 DIAGNOSIS — I10 Essential (primary) hypertension: Secondary | ICD-10-CM | POA: Diagnosis present

## 2019-01-05 DIAGNOSIS — N183 Chronic kidney disease, stage 3 unspecified: Secondary | ICD-10-CM | POA: Diagnosis present

## 2019-01-05 DIAGNOSIS — I5043 Acute on chronic combined systolic (congestive) and diastolic (congestive) heart failure: Secondary | ICD-10-CM | POA: Diagnosis not present

## 2019-01-05 DIAGNOSIS — E876 Hypokalemia: Secondary | ICD-10-CM | POA: Diagnosis present

## 2019-01-05 DIAGNOSIS — R079 Chest pain, unspecified: Secondary | ICD-10-CM | POA: Diagnosis present

## 2019-01-05 DIAGNOSIS — Z95810 Presence of automatic (implantable) cardiac defibrillator: Secondary | ICD-10-CM

## 2019-01-05 DIAGNOSIS — I4891 Unspecified atrial fibrillation: Secondary | ICD-10-CM | POA: Diagnosis present

## 2019-01-05 DIAGNOSIS — Z7952 Long term (current) use of systemic steroids: Secondary | ICD-10-CM

## 2019-01-05 DIAGNOSIS — E785 Hyperlipidemia, unspecified: Secondary | ICD-10-CM | POA: Diagnosis present

## 2019-01-05 DIAGNOSIS — E8889 Other specified metabolic disorders: Secondary | ICD-10-CM | POA: Diagnosis present

## 2019-01-05 DIAGNOSIS — Z515 Encounter for palliative care: Secondary | ICD-10-CM

## 2019-01-05 DIAGNOSIS — N179 Acute kidney failure, unspecified: Secondary | ICD-10-CM

## 2019-01-05 DIAGNOSIS — K219 Gastro-esophageal reflux disease without esophagitis: Secondary | ICD-10-CM | POA: Diagnosis present

## 2019-01-05 DIAGNOSIS — D631 Anemia in chronic kidney disease: Secondary | ICD-10-CM | POA: Diagnosis present

## 2019-01-05 DIAGNOSIS — J810 Acute pulmonary edema: Secondary | ICD-10-CM

## 2019-01-05 DIAGNOSIS — Z7901 Long term (current) use of anticoagulants: Secondary | ICD-10-CM

## 2019-01-05 DIAGNOSIS — N200 Calculus of kidney: Secondary | ICD-10-CM | POA: Diagnosis present

## 2019-01-05 DIAGNOSIS — Z79899 Other long term (current) drug therapy: Secondary | ICD-10-CM

## 2019-01-05 HISTORY — DX: Paroxysmal atrial fibrillation: I48.0

## 2019-01-05 HISTORY — DX: Personal history of nicotine dependence: Z87.891

## 2019-01-05 HISTORY — DX: Unspecified protein-calorie malnutrition: E46

## 2019-01-05 HISTORY — DX: Chronic kidney disease, stage 4 (severe): N18.4

## 2019-01-05 HISTORY — DX: Nonrheumatic mitral (valve) insufficiency: I34.0

## 2019-01-05 HISTORY — DX: Other specified health status: Z78.9

## 2019-01-05 HISTORY — DX: Anemia, unspecified: D64.9

## 2019-01-05 LAB — CBC WITH DIFFERENTIAL/PLATELET
Abs Immature Granulocytes: 0.01 10*3/uL (ref 0.00–0.07)
Basophils Absolute: 0.1 10*3/uL (ref 0.0–0.1)
Basophils Relative: 2 %
Eosinophils Absolute: 0.2 10*3/uL (ref 0.0–0.5)
Eosinophils Relative: 5 %
HCT: 34.2 % — ABNORMAL LOW (ref 36.0–46.0)
Hemoglobin: 10.8 g/dL — ABNORMAL LOW (ref 12.0–15.0)
Immature Granulocytes: 0 %
Lymphocytes Relative: 32 %
Lymphs Abs: 1.3 10*3/uL (ref 0.7–4.0)
MCH: 23.3 pg — ABNORMAL LOW (ref 26.0–34.0)
MCHC: 31.6 g/dL (ref 30.0–36.0)
MCV: 73.9 fL — ABNORMAL LOW (ref 80.0–100.0)
Monocytes Absolute: 0.5 10*3/uL (ref 0.1–1.0)
Monocytes Relative: 13 %
Neutro Abs: 1.8 10*3/uL (ref 1.7–7.7)
Neutrophils Relative %: 48 %
Platelets: 196 10*3/uL (ref 150–400)
RBC: 4.63 MIL/uL (ref 3.87–5.11)
RDW: 19.2 % — ABNORMAL HIGH (ref 11.5–15.5)
WBC: 3.9 10*3/uL — ABNORMAL LOW (ref 4.0–10.5)
nRBC: 0 % (ref 0.0–0.2)

## 2019-01-05 LAB — COMPREHENSIVE METABOLIC PANEL
ALT: 26 U/L (ref 0–44)
AST: 62 U/L — ABNORMAL HIGH (ref 15–41)
Albumin: 3.1 g/dL — ABNORMAL LOW (ref 3.5–5.0)
Alkaline Phosphatase: 114 U/L (ref 38–126)
Anion gap: 8 (ref 5–15)
BUN: 24 mg/dL — ABNORMAL HIGH (ref 8–23)
CO2: 22 mmol/L (ref 22–32)
Calcium: 8.9 mg/dL (ref 8.9–10.3)
Chloride: 105 mmol/L (ref 98–111)
Creatinine, Ser: 2.03 mg/dL — ABNORMAL HIGH (ref 0.44–1.00)
GFR calc Af Amer: 27 mL/min — ABNORMAL LOW (ref >60)
GFR calc non Af Amer: 23 mL/min — ABNORMAL LOW (ref >60)
Glucose, Bld: 126 mg/dL — ABNORMAL HIGH (ref 70–99)
Potassium: 4.1 mmol/L (ref 3.5–5.1)
Sodium: 135 mmol/L (ref 135–145)
Total Bilirubin: 1.3 mg/dL — ABNORMAL HIGH (ref 0.3–1.2)
Total Protein: 7.5 g/dL (ref 6.5–8.1)

## 2019-01-05 LAB — TROPONIN I (HIGH SENSITIVITY): Troponin I (High Sensitivity): 7 ng/L (ref <18)

## 2019-01-05 LAB — BRAIN NATRIURETIC PEPTIDE: B Natriuretic Peptide: 1129.5 pg/mL — ABNORMAL HIGH (ref 0.0–100.0)

## 2019-01-05 LAB — TSH: TSH: 2.456 u[IU]/mL (ref 0.350–4.500)

## 2019-01-05 LAB — SARS CORONAVIRUS 2 BY RT PCR (HOSPITAL ORDER, PERFORMED IN ~~LOC~~ HOSPITAL LAB): SARS Coronavirus 2: NEGATIVE

## 2019-01-05 MED ORDER — NITROGLYCERIN 0.4 MG SL SUBL
0.4000 mg | SUBLINGUAL_TABLET | SUBLINGUAL | Status: DC | PRN
  Administered 2019-01-05: 0.4 mg via SUBLINGUAL
  Filled 2019-01-05: qty 1

## 2019-01-05 MED ORDER — FUROSEMIDE 10 MG/ML IJ SOLN
80.0000 mg | Freq: Once | INTRAMUSCULAR | Status: AC
  Administered 2019-01-06: 80 mg via INTRAVENOUS
  Filled 2019-01-05: qty 8

## 2019-01-05 NOTE — ED Provider Notes (Signed)
11:18 PM Assumed care from Dr. Maryan Rued, please see their note for full history, physical and decision making until this point. In brief this is a 73 y.o. year old female who presented to the ED tonight with Shortness of Breath and Chest Pain     Recently started amiodarone for increasing frequency of vtach.   Today with DOE. CP at 1600 while working around house. NTG otw which improved. Dyspneic on arrival. Pain improved. No clinical pulmonary edema. xr ok.  Pending bnp/pacemaker interrogation and plan for observation for rule out.  Persistently tachypneic w/ lab/PE findings of fluid overload. Lasix ordered. Admit for same.   Labs, studies and imaging reviewed by myself and considered in medical decision making if ordered. Imaging interpreted by radiology.  Labs Reviewed  CBC WITH DIFFERENTIAL/PLATELET - Abnormal; Notable for the following components:      Result Value   WBC 3.9 (*)    Hemoglobin 10.8 (*)    HCT 34.2 (*)    MCV 73.9 (*)    MCH 23.3 (*)    RDW 19.2 (*)    All other components within normal limits  COMPREHENSIVE METABOLIC PANEL - Abnormal; Notable for the following components:   Glucose, Bld 126 (*)    BUN 24 (*)    Creatinine, Ser 2.03 (*)    Albumin 3.1 (*)    AST 62 (*)    Total Bilirubin 1.3 (*)    GFR calc non Af Amer 23 (*)    GFR calc Af Amer 27 (*)    All other components within normal limits  SARS CORONAVIRUS 2 (HOSPITAL ORDER, [facility])  BRAIN NATRIURETIC PEPTIDE  TSH  TROPONIN I (HIGH SENSITIVITY)  TROPONIN I (HIGH SENSITIVITY)    DG Chest Port 1 View  Final Result      No follow-ups on file.    Tekla Malachowski, Corene Cornea, MD 01/07/19 [phone]

## 2019-01-05 NOTE — ED Notes (Signed)
She feels a strange sensation in her chest  But she does not describe it as pain

## 2019-01-05 NOTE — ED Triage Notes (Signed)
Pt presents to ED from home by [facility]. C/o not feeling well all day, SOB since 1200, chest pain at 1600.   Given aspirin and nitro w/ EMS. Nitro relieved pain from 7 to 3.

## 2019-01-05 NOTE — ED Notes (Signed)
Pt feels a little better

## 2019-01-05 NOTE — ED Provider Notes (Signed)
[facility]ERG[facility] DEPARTMENT Provider Note   CSN: [id number] Arrival date & time: 01/05/19  2048     History   Chief Complaint Chief Complaint  Patient presents with  . Shortness of Breath  . Chest Pain    HPI Stephanie Yates is a 73 y.o. female.     Patient is a 73 year old female with a history of chronic renal insufficiency, chronic CHF with nonischemic cardiomyopathy with EF of 20-25%, hypertension, atrial fibrillation and prior ventricular tachycardia status post defibrillator who presents today with complaints of shortness of breath, lightheadedness and chest pain.  Patient states when she woke up this morning today she did not feel great but did not have any specific issues until she started mopping the floor and around noon developed the feeling of lightheadedness and shortness of breath.  She sat down and rested and states the shortness of breath is coming gone but has been persistent and she developed a chest tightness around 4 PM this afternoon.  She has felt nauseated but not had any vomiting.  She denies cough, weight gain or swelling in her legs.  She has recently been having issues with occasional ventricular tachycardia that is resulted in defibrillator firing but states her defibrillator did not go off today.  From the end of June she was placed on amiodarone and last week amiodarone was increased to 400 mg twice daily.  She has been taking all of her medications as prescribed and has not missed any doses of her diuretic.  She denies any history of lung disease and does not use any inhalers.  She states her chest pain was initially an 8 out of 10 pressure sensation without radiation and she was given 3 aspirin and nitroglycerin in the ambulance with improvement of her pain from an 8 to a 2.  She is currently just having a mild discomfort but still feels short of breath.  She does not wear oxygen at home.  The history is provided by the patient.   Shortness of Breath Severity:  Moderate Onset quality:  Sudden Duration:  10 hours Timing:  Constant Progression:  Waxing and waning Chronicity:  New Context: activity   Relieved by:  Nothing Worsened by:  Activity Ineffective treatments:  Rest Associated symptoms: chest pain   Associated symptoms: no abdominal pain, no fever, no sputum production, no vomiting and no wheezing   Associated symptoms comment:  Nausea Chest Pain Associated symptoms: shortness of breath   Associated symptoms: no abdominal pain, no fever and no vomiting     Past Medical History:  Diagnosis Date  . Chronic renal insufficiency, stage III (moderate) (HCC)    Cr 2.0 5/15  . Chronic systolic CHF (congestive heart failure) ([place])   . History of tobacco abuse   . Hyperlipidemia   . Hypertension   . Nonischemic cardiomyopathy ([place])    EF 10-15% on 11/2012 cath  . Sinus bradycardia 12/21/2012  . Ventricular tachycardia ([place]) 12/14/2012    Patient Active Problem List   Diagnosis Date Noted  . Influenza A 09/02/2018  . Lactic acidosis 09/02/2018  . Chronic combined systolic and diastolic congestive heart failure ([place]) 09/02/2018  . Rapid atrial fibrillation ([place]) 06/08/2018  . Acute on chronic combined systolic and diastolic heart failure ([place]) 06/07/2018  . Anemia 06/07/2018  . Acute GI bleeding 06/15/2017  . Chronic anticoagulation   . Acute on chronic congestive heart failure ([place])   . HLD (hyperlipidemia) 03/05/2017  . CKD (chronic kidney disease),  stage III ([place]) 03/05/2017  . Chest pain 03/05/2017  . Symptomatic anemia 03/05/2017  . PAF (paroxysmal atrial fibrillation) ([place]) 04/01/2013  . ICD (implantable cardioverter-defibrillator), dual, [employer] 04/01/2013  . PVC's (premature ventricular contractions) 04/01/2013  . Hypokalemia 12/21/2012  . History of tobacco abuse 12/21/2012  . Elevated troponin 12/21/2012  . Sinus bradycardia 12/21/2012  . Hyperkalemia 12/20/2012  . Nonischemic  cardiomyopathy ([place]) 12/20/2012  . UTI (urinary tract infection) 12/20/2012  . Acute on chronic combined systolic and diastolic CHF (congestive heart failure) ([place]) 12/20/2012  . Ventricular tachycardia ([place]) 12/14/2012  . Hypertension 12/14/2012    Past Surgical History:  Procedure Laterality Date  . ESOPHAGOGASTRODUODENOSCOPY (EGD) WITH PROPOFOL N/A 06/18/2017   Procedure: ESOPHAGOGASTRODUODENOSCOPY (EGD) WITH PROPOFOL;  Surgeon: Ronnette Juniper, MD;  Location: [facility];  Service: Gastroenterology;  Laterality: N/A;  . IMPLANTABLE CARDIOVERTER DEFIBRILLATOR IMPLANT  12/19/2012   [employer] Jude dual-chamber ICD, serial number [id number]  . IMPLANTABLE CARDIOVERTER DEFIBRILLATOR IMPLANT N/A 12/19/2012   Procedure: IMPLANTABLE CARDIOVERTER DEFIBRILLATOR IMPLANT;  Surgeon: Deboraha Sprang, MD;  Location[facility] LAB;  Service: Cardiovascular;  Laterality: N/A;  . LEFT AND RIGHT HEART CATHETERIZATION WITH CORONARY ANGIOGRAM N/A 12/17/2012   Procedure: LEFT AND RIGHT HEART CATHETERIZATION WITH CORONARY ANGIOGRAM;  Surgeon: Sherren Mocha, MD;  Location: [facility] CATH LAB;  Service: Cardiovascular;  Laterality: N/A;     OB History   No obstetric history on file.      Home Medications    Prior to Admission medications   Medication Sig Start Date End Date Taking? Authorizing Provider  acetaminophen (TYLENOL) 500 MG tablet Take 1,000 mg by mouth every 6 (six) hours as needed for moderate pain.    [provider]  amiodarone (PACERONE) 200 MG tablet Take 2 tablets (400 mg total) by mouth 2 (two) times daily for 14 days, THEN 1 tablet (200 mg total) 2 (two) times daily for 14 days, THEN 1 tablet (200 mg total) daily. 12/27/18 02/23/19  Shirley Friar, PA-C  atorvastatin (LIPITOR) 40 MG tablet Take 1 tablet (40 mg total) by mouth daily at 6 PM. 07/05/18   Deboraha Sprang, MD  carvedilol (COREG) 3.125 MG tablet Take 1 tablet (3.125 mg total) by mouth 2 (two) times daily with a meal. 09/12/18   Deboraha Sprang, MD  Cholecalciferol (VITAMIN D-3 PO) Take 1,000 Units by mouth at bedtime.     [provider]  ELIQUIS 2.5 MG TABS tablet TAKE 1 TABLET BY MOUTH TWICE DAILY 08/13/18   Deboraha Sprang, MD  ferrous gluconate (FERGON) 324 MG tablet Take 1 tablet by mouth 2 (two) times daily. 07/05/18   [provider]  furosemide (LASIX) 80 MG tablet Take 1 tablet (80 mg total) by mouth daily. 07/05/18   Deboraha Sprang, MD  magnesium oxide (MAG-OX) 400 (241.3 Mg) MG tablet Take 1 tablet (400 mg total) by mouth daily. 07/17/18   Deboraha Sprang, MD  oseltamivir (TAMIFLU) 30 MG capsule Take 1 capsule (30 mg total) by mouth daily. 09/06/18   Georgette Shell, MD  pantoprazole (PROTONIX) 40 MG tablet Take 1 tablet (40 mg total) by mouth 2 (two) times daily before a meal. 06/18/17   Lavina Hamman, MD  potassium chloride SA (K-DUR,KLOR-CON) 20 MEQ tablet Take 1 tablet (20 mEq total) by mouth daily. 09/12/18   Deboraha Sprang, MD  predniSONE (DELTASONE) 10 MG tablet Take 4 tablets for 2 days then 3 tablets for 2 days then 2 tablets for  2 days and then 1 tablet daily till done 09/06/18   Georgette Shell, MD    Family History Family History  Problem Relation Age of Onset  . Cancer Mother     Social History Social History   Tobacco Use  . Smoking status: Former Research scientist (life sciences)  . Smokeless tobacco: Never Used  Substance Use Topics  . Alcohol use: No  . Drug use: No     Allergies   Strawberry extract   Review of Systems Review of Systems  Constitutional: Negative for fever.  Respiratory: Positive for shortness of breath. Negative for sputum production and wheezing.   Cardiovascular: Positive for chest pain.  Gastrointestinal: Negative for abdominal pain and vomiting.  All other systems reviewed and are negative.    Physical Exam Updated Vital Signs BP 139/82   Pulse 68   Temp 98.4 F (36.9 C) (Oral)   Resp (!) 21   Ht 5\' 6"  (1.676 m)   Wt 63.5 kg   SpO2 97%   BMI 22.60  kg/m   Physical Exam Vitals signs and nursing note reviewed.  Constitutional:      General: She is not in acute distress.    Appearance: She is well-developed and normal weight.  HENT:     Head: Normocephalic and atraumatic.  Eyes:     Pupils: Pupils are equal, round, and reactive to light.  Cardiovascular:     Rate and Rhythm: Normal rate and regular rhythm.     Heart sounds: Normal heart sounds. No murmur. No friction rub.  Pulmonary:     Effort: Pulmonary effort is normal. Tachypnea present.     Breath sounds: Examination of the right-lower field reveals decreased breath sounds. Examination of the left-lower field reveals decreased breath sounds. Decreased breath sounds present. No wheezing or rales.  Chest:     Comments: Defibrillator present in the left upper chest wall Abdominal:     General: Bowel sounds are normal. There is no distension.     Palpations: Abdomen is soft.     Tenderness: There is no abdominal tenderness. There is no guarding or rebound.  Musculoskeletal: Normal range of motion.        General: No tenderness.     Right lower leg: She exhibits no tenderness. No edema.     Left lower leg: She exhibits no tenderness. No edema.     Comments: No edema  Skin:    General: Skin is warm and dry.     Findings: No rash.  Neurological:     General: No focal deficit present.     Mental Status: She is alert and oriented to person, place, and time.     Cranial Nerves: No cranial nerve deficit.  Psychiatric:        Mood and Affect: Mood normal.        Behavior: Behavior normal.      ED Treatments / Results  Labs (all labs ordered are listed, but only abnormal results are displayed) Labs Reviewed  CBC WITH DIFFERENTIAL/PLATELET - Abnormal; Notable for the following components:      Result Value   WBC 3.9 (*)    Hemoglobin 10.8 (*)    HCT 34.2 (*)    MCV 73.9 (*)    MCH 23.3 (*)    RDW 19.2 (*)    All other components within normal limits  COMPREHENSIVE  METABOLIC PANEL - Abnormal; Notable for the following components:   Glucose, Bld 126 (*)    BUN 24 (*)  Creatinine, Ser 2.03 (*)    Albumin 3.1 (*)    AST 62 (*)    Total Bilirubin 1.3 (*)    GFR calc non Af Amer 23 (*)    GFR calc Af Amer 27 (*)    All other components within normal limits  BRAIN NATRIURETIC PEPTIDE - Abnormal; Notable for the following components:   B Natriuretic Peptide 1,129.5 (*)    All other components within normal limits  SARS CORONAVIRUS 2 (HOSPITAL ORDER, PERFORMED IN [facility] LAB)  TSH  TROPONIN I (HIGH SENSITIVITY)  TROPONIN I (HIGH SENSITIVITY)    EKG EKG Interpretation  Date/Time:  Saturday January 05 2019 20:54:13 EDT Ventricular Rate:  72 PR Interval:    QRS Duration: 151 QT Interval:  443 QTC Calculation: 485 R Axis:   -22 Text Interpretation:  Sinus rhythm Prolonged PR interval Probable left atrial enlargement Left bundle branch block No significant change since last tracing Confirmed by Blanchie Dessert ([id number]) on 01/05/2019 9:08:02 PM   Radiology Dg Chest Port 1 View  Result Date: 01/05/2019 CLINICAL DATA:  Shortness of breath and chest pain EXAM: PORTABLE CHEST 1 VIEW COMPARISON:  09/02/2018 FINDINGS: Cardiac shadow is enlarged. Defibrillator is again noted and stable. Aortic calcifications are seen. Vascular congestion is noted with interstitial edema. No sizable effusion is noted. IMPRESSION: Changes of mild CHF. Electronically Signed   By: Inez Catalina M.D.   On: 01/05/2019 22:06    Procedures Procedures (including critical care time)  Medications Ordered in ED Medications  nitroGLYCERIN (NITROSTAT) SL tablet 0.4 mg (has no administration in time range)     Initial Impression / Assessment and Plan / ED Course  I have reviewed the triage vital signs and the nursing notes.  Pertinent labs & imaging results that were available during my care of the patient were reviewed by me and considered in my medical decision  making (see chart for details).       Is patient is a 73 year old female with extensive cardiac history with cardiomyopathy, decreased ejection fraction, atrial fibrillation, recent episodes of V. tach and defibrillation who is presenting with shortness of breath and chest pain.  Chest pain is significantly better after nitroglycerin the patient is still tachypneic and appears winded.  She is satting 100% on 2 L.  She talks in short phrases.  Patient is also currently anticoagulated.  Low suspicion for PE at this time but concern for ACS, dissection, anemia, pneumothorax.  Patient has no prior history of lung disease and low suspicion for COPD or asthma.  Patient does not appear significantly fluid overloaded but does have mild JVD.  Possibility this is CHF.  Denies any infectious symptoms concerning for COVID or pneumonia.  Patient will be given another nitroglycerin as the first nitroglycerin significantly decreased her pain.  We will continue oxygen, labs, chest x-ray is pending.  Patient was given aspirin in route.  Patient's EKG today shows a sinus rhythm without evidence of dysrhythmia.  Will interrogate her [facility] pacemaker.  11:36 PM Patient's x-ray shows mild fluid overload, CBC without acute changes and CMP with mild AKI today.  Troponin is 7.  BNP is still pending.  Patient checked out to Dr. Dayna Barker.  Will most likely need admission for chest pain rule out as well as diuresis.  Final Clinical Impressions(s) / ED Diagnoses   Final diagnoses:  None    ED Discharge Orders    None       Blanchie Dessert, MD 01/05/19  2337  

## 2019-01-06 ENCOUNTER — Encounter (HOSPITAL_COMMUNITY): Payer: Self-pay

## 2019-01-06 ENCOUNTER — Other Ambulatory Visit: Payer: Self-pay

## 2019-01-06 DIAGNOSIS — N184 Chronic kidney disease, stage 4 (severe): Secondary | ICD-10-CM | POA: Diagnosis present

## 2019-01-06 DIAGNOSIS — Z87891 Personal history of nicotine dependence: Secondary | ICD-10-CM | POA: Diagnosis not present

## 2019-01-06 DIAGNOSIS — I447 Left bundle-branch block, unspecified: Secondary | ICD-10-CM | POA: Diagnosis present

## 2019-01-06 DIAGNOSIS — I472 Ventricular tachycardia: Secondary | ICD-10-CM | POA: Diagnosis not present

## 2019-01-06 DIAGNOSIS — Z7901 Long term (current) use of anticoagulants: Secondary | ICD-10-CM | POA: Diagnosis not present

## 2019-01-06 DIAGNOSIS — I340 Nonrheumatic mitral (valve) insufficiency: Secondary | ICD-10-CM | POA: Diagnosis present

## 2019-01-06 DIAGNOSIS — I480 Paroxysmal atrial fibrillation: Secondary | ICD-10-CM | POA: Diagnosis present

## 2019-01-06 DIAGNOSIS — I130 Hypertensive heart and chronic kidney disease with heart failure and stage 1 through stage 4 chronic kidney disease, or unspecified chronic kidney disease: Secondary | ICD-10-CM | POA: Diagnosis present

## 2019-01-06 DIAGNOSIS — N200 Calculus of kidney: Secondary | ICD-10-CM | POA: Diagnosis present

## 2019-01-06 DIAGNOSIS — I5043 Acute on chronic combined systolic (congestive) and diastolic (congestive) heart failure: Secondary | ICD-10-CM | POA: Diagnosis present

## 2019-01-06 DIAGNOSIS — Z515 Encounter for palliative care: Secondary | ICD-10-CM | POA: Diagnosis not present

## 2019-01-06 DIAGNOSIS — I428 Other cardiomyopathies: Secondary | ICD-10-CM | POA: Diagnosis present

## 2019-01-06 DIAGNOSIS — I959 Hypotension, unspecified: Secondary | ICD-10-CM | POA: Diagnosis not present

## 2019-01-06 DIAGNOSIS — E876 Hypokalemia: Secondary | ICD-10-CM | POA: Diagnosis present

## 2019-01-06 DIAGNOSIS — Z91018 Allergy to other foods: Secondary | ICD-10-CM | POA: Diagnosis not present

## 2019-01-06 DIAGNOSIS — I10 Essential (primary) hypertension: Secondary | ICD-10-CM | POA: Diagnosis not present

## 2019-01-06 DIAGNOSIS — N183 Chronic kidney disease, stage 3 (moderate): Secondary | ICD-10-CM | POA: Diagnosis not present

## 2019-01-06 DIAGNOSIS — D631 Anemia in chronic kidney disease: Secondary | ICD-10-CM | POA: Diagnosis present

## 2019-01-06 DIAGNOSIS — I471 Supraventricular tachycardia: Secondary | ICD-10-CM | POA: Diagnosis not present

## 2019-01-06 DIAGNOSIS — Z7952 Long term (current) use of systemic steroids: Secondary | ICD-10-CM | POA: Diagnosis not present

## 2019-01-06 DIAGNOSIS — D509 Iron deficiency anemia, unspecified: Secondary | ICD-10-CM | POA: Diagnosis present

## 2019-01-06 DIAGNOSIS — E8889 Other specified metabolic disorders: Secondary | ICD-10-CM | POA: Diagnosis present

## 2019-01-06 DIAGNOSIS — Z95810 Presence of automatic (implantable) cardiac defibrillator: Secondary | ICD-10-CM | POA: Diagnosis not present

## 2019-01-06 DIAGNOSIS — N179 Acute kidney failure, unspecified: Secondary | ICD-10-CM | POA: Diagnosis present

## 2019-01-06 DIAGNOSIS — E785 Hyperlipidemia, unspecified: Secondary | ICD-10-CM | POA: Diagnosis present

## 2019-01-06 DIAGNOSIS — Z79899 Other long term (current) drug therapy: Secondary | ICD-10-CM | POA: Diagnosis not present

## 2019-01-06 DIAGNOSIS — I509 Heart failure, unspecified: Secondary | ICD-10-CM | POA: Insufficient documentation

## 2019-01-06 DIAGNOSIS — Z1159 Encounter for screening for other viral diseases: Secondary | ICD-10-CM | POA: Diagnosis not present

## 2019-01-06 DIAGNOSIS — K219 Gastro-esophageal reflux disease without esophagitis: Secondary | ICD-10-CM | POA: Diagnosis present

## 2019-01-06 LAB — CBC WITH DIFFERENTIAL/PLATELET
Abs Immature Granulocytes: 0.01 10*3/uL (ref 0.00–0.07)
Basophils Absolute: 0.1 10*3/uL (ref 0.0–0.1)
Basophils Relative: 2 %
Eosinophils Absolute: 0.2 10*3/uL (ref 0.0–0.5)
Eosinophils Relative: 3 %
HCT: 34.6 % — ABNORMAL LOW (ref 36.0–46.0)
Hemoglobin: 11 g/dL — ABNORMAL LOW (ref 12.0–15.0)
Immature Granulocytes: 0 %
Lymphocytes Relative: 34 %
Lymphs Abs: 1.8 10*3/uL (ref 0.7–4.0)
MCH: 23.6 pg — ABNORMAL LOW (ref 26.0–34.0)
MCHC: 31.8 g/dL (ref 30.0–36.0)
MCV: 74.2 fL — ABNORMAL LOW (ref 80.0–100.0)
Monocytes Absolute: 1.1 10*3/uL — ABNORMAL HIGH (ref 0.1–1.0)
Monocytes Relative: 19 %
Neutro Abs: 2.3 10*3/uL (ref 1.7–7.7)
Neutrophils Relative %: 42 %
Platelets: 210 10*3/uL (ref 150–400)
RBC: 4.66 MIL/uL (ref 3.87–5.11)
RDW: 19.3 % — ABNORMAL HIGH (ref 11.5–15.5)
WBC: 5.5 10*3/uL (ref 4.0–10.5)
nRBC: 0 % (ref 0.0–0.2)

## 2019-01-06 LAB — TROPONIN I (HIGH SENSITIVITY): Troponin I (High Sensitivity): 6 ng/L (ref <18)

## 2019-01-06 MED ORDER — ONDANSETRON HCL 4 MG/2ML IJ SOLN: 4.0000 mg | Freq: Four times a day (QID) | INTRAMUSCULAR | Status: DC | PRN

## 2019-01-06 MED ORDER — VITAMIN D 25 MCG (1000 UNIT) PO TABS
1000.0000 [IU] | ORAL_TABLET | Freq: Every day | ORAL | Status: DC
  Administered 2019-01-06 – 2019-01-11 (×6): 1000 [IU] via ORAL
  Filled 2019-01-06 (×6): qty 1

## 2019-01-06 MED ORDER — SODIUM CHLORIDE 0.9 % IV SOLN: 250.0000 mL | INTRAVENOUS | Status: DC | PRN

## 2019-01-06 MED ORDER — HEPARIN SODIUM (PORCINE) 5000 UNIT/ML IJ SOLN: 5000.0000 [IU] | Freq: Three times a day (TID) | INTRAMUSCULAR | Status: DC

## 2019-01-06 MED ORDER — ACETAMINOPHEN 500 MG PO TABS
1000.0000 mg | ORAL_TABLET | Freq: Four times a day (QID) | ORAL | Status: DC | PRN
  Administered 2019-01-06: 1000 mg via ORAL
  Filled 2019-01-06: qty 2

## 2019-01-06 MED ORDER — SODIUM CHLORIDE 0.9% FLUSH
3.0000 mL | Freq: Two times a day (BID) | INTRAVENOUS | Status: DC
  Administered 2019-01-06 – 2019-01-12 (×13): 3 mL via INTRAVENOUS

## 2019-01-06 MED ORDER — ACETAMINOPHEN 325 MG PO TABS: 650.0000 mg | ORAL_TABLET | ORAL | Status: DC | PRN

## 2019-01-06 MED ORDER — FERROUS GLUCONATE 324 (38 FE) MG PO TABS
324.0000 mg | ORAL_TABLET | Freq: Two times a day (BID) | ORAL | Status: DC
  Administered 2019-01-06 – 2019-01-12 (×13): 324 mg via ORAL
  Filled 2019-01-06 (×14): qty 1

## 2019-01-06 MED ORDER — APIXABAN 2.5 MG PO TABS
2.5000 mg | ORAL_TABLET | Freq: Two times a day (BID) | ORAL | Status: DC
  Administered 2019-01-06 – 2019-01-09 (×7): 2.5 mg via ORAL
  Filled 2019-01-06 (×7): qty 1

## 2019-01-06 MED ORDER — ATORVASTATIN CALCIUM 40 MG PO TABS
40.0000 mg | ORAL_TABLET | Freq: Every day | ORAL | Status: DC
  Administered 2019-01-06 – 2019-01-11 (×6): 40 mg via ORAL
  Filled 2019-01-06 (×6): qty 1

## 2019-01-06 MED ORDER — PANTOPRAZOLE SODIUM 40 MG PO TBEC
40.0000 mg | DELAYED_RELEASE_TABLET | Freq: Two times a day (BID) | ORAL | Status: DC
  Administered 2019-01-06 – 2019-01-12 (×13): 40 mg via ORAL
  Filled 2019-01-06 (×13): qty 1

## 2019-01-06 MED ORDER — AMIODARONE HCL 200 MG PO TABS
400.0000 mg | ORAL_TABLET | Freq: Two times a day (BID) | ORAL | Status: DC
  Administered 2019-01-06 – 2019-01-09 (×7): 400 mg via ORAL
  Filled 2019-01-06 (×7): qty 2

## 2019-01-06 MED ORDER — ZOLPIDEM TARTRATE 5 MG PO TABS
5.0000 mg | ORAL_TABLET | Freq: Once | ORAL | Status: AC
  Administered 2019-01-06: 5 mg via ORAL
  Filled 2019-01-06: qty 1

## 2019-01-06 MED ORDER — CARVEDILOL 3.125 MG PO TABS
3.1250 mg | ORAL_TABLET | Freq: Two times a day (BID) | ORAL | Status: DC
  Administered 2019-01-06 – 2019-01-12 (×13): 3.125 mg via ORAL
  Filled 2019-01-06 (×13): qty 1

## 2019-01-06 MED ORDER — MAGNESIUM OXIDE 400 (241.3 MG) MG PO TABS
400.0000 mg | ORAL_TABLET | Freq: Every day | ORAL | Status: DC
  Administered 2019-01-06 – 2019-01-12 (×6): 400 mg via ORAL
  Filled 2019-01-06 (×7): qty 1

## 2019-01-06 MED ORDER — SODIUM CHLORIDE 0.9% FLUSH: 3.0000 mL | INTRAVENOUS | Status: DC | PRN

## 2019-01-06 MED ORDER — FUROSEMIDE 10 MG/ML IJ SOLN
40.0000 mg | Freq: Two times a day (BID) | INTRAMUSCULAR | Status: DC
  Administered 2019-01-06 (×2): 40 mg via INTRAVENOUS
  Filled 2019-01-06 (×2): qty 4

## 2019-01-06 MED ORDER — POTASSIUM CHLORIDE CRYS ER 20 MEQ PO TBCR
20.0000 meq | EXTENDED_RELEASE_TABLET | Freq: Every day | ORAL | Status: DC
  Administered 2019-01-06 – 2019-01-09 (×4): 20 meq via ORAL
  Filled 2019-01-06 (×4): qty 1

## 2019-01-06 NOTE — Progress Notes (Signed)
PROGRESS NOTE    DESARIE FEILD  OTR:[id number] DOB: 1946/04/14 DOA: 01/05/2019 PCP: Josetta Huddle, MD   Brief Narrative: Stephanie Yates is a 73 y.o. female with medical history significant of atrial fibrillation, systolic dysfunction CHF with EF of 25%, tobacco abuse, CKD III, hyperlipidemia and hypertension. She presented secondary to worsening dyspnea and chest discomfort concerning for acute on chronic heart failure. She has been started on IV diuresis.   Assessment & Plan:   Principal Problem:   Acute on chronic combined systolic and diastolic CHF (congestive heart failure) (HCC) Active Problems:   Hypertension   Nonischemic cardiomyopathy (HCC)   Hypokalemia   History of tobacco abuse   CKD (chronic kidney disease), stage III (HCC)   Chest pain   Rapid atrial fibrillation (HCC)   Acute on chronic combined systolic and diastolic heart failure Nonischemic cardiomyopathy. Last EF of 20-25% in 05/2018. Last weight of 145 lbs about three weeks prior. BNP of 1129 on admission. Not at functional baseline secondary to dyspnea. -Continue Lasix IV BID -Strict in and out/daily weights -Transthoracic Echocardiogram pending -PT/OT eval  Dyspnea Presumed secondary to heart failure but it is possible this could be secondary to amiodarone. No hypoxia noted. -ESR and CRP -O2 as needed to keep saturations >90%  Essential hypertension Well controlled.  Atrial fibrillation Patient recently increased to amiodarone 400 mg BID. Patient is also on Eliquis and Coreg -Continue amiodarone, Coreg, Eliquis  Hyperlipidemia -Continue Lipitor  AKI on CKD stage III Likely secondary to heart failure. Baseline of around 1.6-1.7. Creatinine of 2.03 on admission.   DVT prophylaxis: Eliquis Code Status:   Code Status: Full Code Family Communication: None Disposition Plan: Discharge likely home in 48-72 hours   Consultants:   None  Procedures:   None  Antimicrobials:  None     Subjective: Some dyspnea overnight. Decreased exercise tolerance  Objective: Vitals:   01/06/19 0118 01/06/19 0204 01/06/19 0437 01/06/19 0830  BP:  (!) 152/95 138/73 113/62  Pulse:  83 70 70  Resp:  _0 Temp:  98.4 F (36.9 C) 97.9 F (36.6 C) 97.7 F (36.5 C)  TempSrc:  Oral Oral Oral  SpO2:  95% 98% 100%  Weight: 63.5 kg     Height:        Intake/Output Summary (Last 24 hours) at 01/06/2019 0928 Last data filed at 01/06/2019 0630 Gross per 24 hour  Intake 360 ml  Output 1200 ml  Net -840 ml   Filed Weights   01/05/19 2057 01/06/19 0118  Weight: 63.5 kg 63.5 kg    Examination:  General exam: Appears calm and comfortable Respiratory system: Left upper airway wheeze with lower lobe rales. Respiratory effort normal. Cardiovascular system: S1 & S2 heard, RRR. No murmurs, rubs, gallops or clicks. Gastrointestinal system: Abdomen is nondistended, soft and nontender. No organomegaly or masses felt. Normal bowel sounds heard. Central nervous system: Alert and oriented. No focal neurological deficits. Extremities: No edema. No calf tenderness Skin: No cyanosis. No rashes Psychiatry: Judgement and insight appear normal. Mood & affect appropriate.      Data Reviewed: I have personally reviewed following labs and imaging studies  CBC: Recent Labs  Lab 01/05/19 2213 01/06/19 0241  WBC 3.9* 5.5  NEUTROABS 1.8 2.3  HGB 10.8* 11.0*  HCT 34.2* 34.6*  MCV 73.9* 74.2*  PLT 196 833   Basic Metabolic Panel: Recent Labs  Lab 01/05/19 2213  NA 135  K 4.1  CL 105  CO2 22  GLUCOSE 126*  BUN 24*  CREATININE 2.03*  CALCIUM 8.9   GFR: Estimated Creatinine Clearance: 22.4 mL/min (A) (by C-G formula based on SCr of 2.03 mg/dL (H)). Liver Function Tests: Recent Labs  Lab 01/05/19 2213  AST 62*  ALT 26  ALKPHOS 114  BILITOT 1.3*  PROT 7.5  ALBUMIN 3.1*   No results for input(s): LIPASE, AMYLASE in the last 168 hours. No results for input(s): AMMONIA in the  last 168 hours. Coagulation Profile: No results for input(s): INR, PROTIME in the last 168 hours. Cardiac Enzymes: No results for input(s): CKTOTAL, CKMB, CKMBINDEX, TROPONINI in the last 168 hours. BNP (last 3 results) No results for input(s): PROBNP in the last 8760 hours. HbA1C: No results for input(s): HGBA1C in the last 72 hours. CBG: No results for input(s): GLUCAP in the last 168 hours. Lipid Profile: No results for input(s): CHOL, HDL, LDLCALC, TRIG, CHOLHDL, LDLDIRECT in the last 72 hours. Thyroid Function Tests: Recent Labs    01/05/19 2213  TSH 2.456   Anemia Panel: No results for input(s): VITAMINB12, FOLATE, FERRITIN, TIBC, IRON, RETICCTPCT in the last 72 hours. Sepsis Labs: No results for input(s): PROCALCITON, LATICACIDVEN in the last 168 hours.  Recent Results (from the past 240 hour(s))  SARS Coronavirus 2 (CEPHEID - Performed in [place] hospital lab), Hosp Order     Status: None   Collection Time: 01/05/19 10:31 PM   Specimen: Nasopharyngeal Swab  Result Value Ref Range Status   SARS Coronavirus 2 NEGATIVE NEGATIVE Final    Comment: (NOTE) If result is NEGATIVE SARS-CoV-2 target nucleic acids are NOT DETECTED. The SARS-CoV-2 RNA is generally detectable in upper and lower  respiratory specimens during the acute phase of infection. The lowest  concentration of SARS-CoV-2 viral copies this assay can detect is 250  copies / mL. A negative result does not preclude SARS-CoV-2 infection  and should not be used as the sole basis for treatment or other  patient management decisions.  A negative result may occur with  improper specimen collection / handling, submission of specimen other  than nasopharyngeal swab, presence of viral mutation(s) within the  areas targeted by this assay, and inadequate number of viral copies  (<250 copies / mL). A negative result must be combined with clinical  observations, patient history, and epidemiological information. If  result is POSITIVE SARS-CoV-2 target nucleic acids are DETECTED. The SARS-CoV-2 RNA is generally detectable in upper and lower  respiratory specimens dur ing the acute phase of infection.  Positive  results are indicative of active infection with SARS-CoV-2.  Clinical  correlation with patient history and other diagnostic information is  necessary to determine patient infection status.  Positive results do  not rule out bacterial infection or co-infection with other viruses. If result is PRESUMPTIVE POSTIVE SARS-CoV-2 nucleic acids MAY BE PRESENT.   A presumptive positive result was obtained on the submitted specimen  and confirmed on repeat testing.  While 2019 novel coronavirus  (SARS-CoV-2) nucleic acids may be present in the submitted sample  additional confirmatory testing may be necessary for epidemiological  and / or clinical management purposes  to differentiate between  SARS-CoV-2 and other Sarbecovirus currently known to infect humans.  If clinically indicated additional testing with an alternate test  methodology [phone]) is advised. The SARS-CoV-2 RNA is generally  detectable in upper and lower respiratory sp ecimens during the acute  phase of infection. The expected result is Negative. Fact Sheet for Patients:  StrictlyIdeas.no Fact Sheet for  Healthcare Providers: BankingDealers.co.za This test is not yet approved or cleared by the [place] and has been authorized for detection and/or diagnosis of SARS-CoV-2 by FDA under an Emergency Use Authorization (EUA).  This EUA will remain in effect (meaning this test can be used) for the duration of the COVID-19 declaration under Section 564(b)(1) of the Act, 21 U.S.C. section 360bbb-3(b)(1), unless the authorization is terminated or revoked sooner. Performed at [facility], [place] [address] [place], [place] [address]          Radiology Studies: Dg Chest Port  1 View  Result Date: 01/05/2019 CLINICAL DATA:  Shortness of breath and chest pain EXAM: PORTABLE CHEST 1 VIEW COMPARISON:  09/02/2018 FINDINGS: Cardiac shadow is enlarged. Defibrillator is again noted and stable. Aortic calcifications are seen. Vascular congestion is noted with interstitial edema. No sizable effusion is noted. IMPRESSION: Changes of mild CHF. Electronically Signed   By: Inez Catalina M.D.   On: 01/05/2019 22:06        Scheduled Meds: . amiodarone  400 mg Oral BID  . apixaban  2.5 mg Oral BID  . atorvastatin  40 mg Oral q1800  . carvedilol  3.125 mg Oral BID WC  . cholecalciferol  1,000 Units Oral QHS  . ferrous gluconate  324 mg Oral BID  . furosemide  40 mg Intravenous BID  . magnesium oxide  400 mg Oral Daily  . pantoprazole  40 mg Oral BID AC  . potassium chloride SA  20 mEq Oral Daily  . sodium chloride flush  3 mL Intravenous Q12H   Continuous Infusions: . sodium chloride       LOS: 0 days     Cordelia Poche, MD [facility] 01/06/2019, 9:28 AM  If 7PM-7AM, please contact night-coverage [email or web address]

## 2019-01-06 NOTE — ED Notes (Signed)
Report called to [email or web address]

## 2019-01-06 NOTE — H&P (Signed)
History and Physical   Stephanie Yates ZSM:[id number] DOB: [birth year] DOA: 01/05/2019  Referring MD/NP/PA: Dr. Dayna Barker  PCP: Josetta Huddle, MD   Outpatient Specialists: Dr. Adam Phenix, cardiology  Patient coming from: Home  Chief Complaint: Chest pain and shortness of breath  HPI: Stephanie Yates is a 73 y.o. female with medical history significant of atrial fibrillation, systolic dysfunction CHF with EF of 25%, tobacco abuse, CKD III, hyperlipidemia and hypertension who presented to the ER with worsening shortness of breath and chest discomfort.  Patient describe exertional dyspnea.  She also felt some fever and is shaking.  She has felt generally unwell in the last 2 days.  She denied any pedal edema.  She has had some PND and orthopnea.  Chest pain responded to nitroglycerin in the ER.  She was dyspneic in the ER but now stable.  Patient has had frequent V. tach's and was initiated on amiodarone recently.  Enzymes were negative.  Patient admitted with acute exacerbation of systolic CHF.  ED Course: Temperature is 98.4 blood pressure 153/83 pulse 83 respirate of 24 oxygen sat 90% on room air.  Sodium 135 potassium 4.1 chloride 104 CO2 22 glucose is 126 and BUN 24 creatinine 2.03.  Hemoglobin 10.8 otherwise CBC also within normal.  Chest x-ray showed no active disease.  COVID-19 testing is negative.  EKG is unchanged from previous.  Patient admitted for CHF exacerbation  Review of Systems: As per HPI otherwise 10 point review of systems negative.    Past Medical History:  Diagnosis Date  . Chronic renal insufficiency, stage III (moderate) (HCC)    Cr 2.0 5/15  . Chronic systolic CHF (congestive heart failure) ([place])   . History of tobacco abuse   . Hyperlipidemia   . Hypertension   . Nonischemic cardiomyopathy ([place])    EF 10-15% on 11/2012 cath  . Sinus bradycardia 12/21/2012  . Ventricular tachycardia ([place]) 12/14/2012    Past Surgical History:  Procedure Laterality Date  .  ESOPHAGOGASTRODUODENOSCOPY (EGD) WITH PROPOFOL N/A 06/18/2017   Procedure: ESOPHAGOGASTRODUODENOSCOPY (EGD) WITH PROPOFOL;  Surgeon: Ronnette Juniper, MD;  Location: [place];  Service: Gastroenterology;  Laterality: N/A;  . IMPLANTABLE CARDIOVERTER DEFIBRILLATOR IMPLANT  12/19/2012   St. Jude dual-chamber ICD, serial number [id number]  . IMPLANTABLE CARDIOVERTER DEFIBRILLATOR IMPLANT N/A 12/19/2012   Procedure: IMPLANTABLE CARDIOVERTER DEFIBRILLATOR IMPLANT;  Surgeon: Deboraha Sprang, MD;  Location: [facility] C[facility] LAB;  Service: Cardiovascular;  Laterality: N/A;  . LEFT AND RIGHT HEART CATHETERIZATION WITH CORONARY ANGIOGRAM N/A 12/17/2012   Procedure: LEFT AND RIGHT HEART CATHETERIZATION WITH CORONARY ANGIOGRAM;  Surgeon: Sherren Mocha, MD;  Location: [facility] CATH LAB;  Service: Cardiovascular;  Laterality: N/A;     reports that she has quit smoking. She has never used smokeless tobacco. She reports that she does not drink alcohol or use drugs.  Allergies  Allergen Reactions  . Strawberry Extract Hives, Itching, Swelling and Other (See Comments)    Hives and itching     Family History  Problem Relation Age of Onset  . Cancer Mother      Prior to Admission medications   Medication Sig Start Date End Date Taking? Authorizing Provider  acetaminophen (TYLENOL) 500 MG tablet Take 1,000 mg by mouth every 6 (six) hours as needed for moderate pain.    [provider]  amiodarone (PACERONE) 200 MG tablet Take 2 tablets (400 mg total) by mouth 2 (two) times daily for 14 days, THEN 1 tablet (200 mg total) 2 (two) times daily  for 14 days, THEN 1 tablet (200 mg total) daily. 12/27/18 02/23/19  Shirley Friar, PA-C  atorvastatin (LIPITOR) 40 MG tablet Take 1 tablet (40 mg total) by mouth daily at 6 PM. 07/05/18   Deboraha Sprang, MD  carvedilol (COREG) 3.125 MG tablet Take 1 tablet (3.125 mg total) by mouth 2 (two) times daily with a meal. 09/12/18   Deboraha Sprang, MD  Cholecalciferol (VITAMIN D-3 PO)  Take 1,000 Units by mouth at bedtime.     [provider]  ELIQUIS 2.5 MG TABS tablet TAKE 1 TABLET BY MOUTH TWICE DAILY 08/13/18   Deboraha Sprang, MD  ferrous gluconate (FERGON) 324 MG tablet Take 1 tablet by mouth 2 (two) times daily. 07/05/18   [provider]  furosemide (LASIX) 80 MG tablet Take 1 tablet (80 mg total) by mouth daily. 07/05/18   Deboraha Sprang, MD  magnesium oxide (MAG-OX) 400 (241.3 Mg) MG tablet Take 1 tablet (400 mg total) by mouth daily. 07/17/18   Deboraha Sprang, MD  oseltamivir (TAMIFLU) 30 MG capsule Take 1 capsule (30 mg total) by mouth daily. 09/06/18   Georgette Shell, MD  pantoprazole (PROTONIX) 40 MG tablet Take 1 tablet (40 mg total) by mouth 2 (two) times daily before a meal. 06/18/17   Lavina Hamman, MD  potassium chloride SA (K-DUR,KLOR-CON) 20 MEQ tablet Take 1 tablet (20 mEq total) by mouth daily. 09/12/18   Deboraha Sprang, MD  predniSONE (DELTASONE) 10 MG tablet Take 4 tablets for 2 days then 3 tablets for 2 days then 2 tablets for 2 days and then 1 tablet daily till done 09/06/18   Georgette Shell, MD    Physical Exam: Vitals:   01/05/19 2300 01/05/19 2330 01/06/19 0000 01/06/19 0015  BP: 116/68 111/63 118/66 128/73  Pulse: 70 71 69 70  Resp: 18 (!) 22 18 20   Temp:      TempSrc:      SpO2: 98% 100% 94% 95%  Weight:      Height:          Constitutional: Acutely ill looking in mild distress Vitals:   01/05/19 2300 01/05/19 2330 01/06/19 0000 01/06/19 0015  BP: 116/68 111/63 118/66 128/73  Pulse: 70 71 69 70  Resp: 18 (!) 22 18 20   Temp:      TempSrc:      SpO2: 98% 100% 94% 95%  Weight:      Height:       Eyes: PERRL, lids and conjunctivae normal ENMT: Mucous membranes are moist. Posterior pharynx clear of any exudate or lesions.Normal dentition.  Neck: normal, supple, no masses, no thyromegaly.  JVD to the angle of the jaw Respiratory: Good air entry with diffuse basal crackles and rhonchi, no wheezing. Normal  respiratory effort. No accessory muscle use.  Cardiovascular: Regular rate and rhythm, grade 3 systolic ejection murmur/ rubs / gallops. No extremity edema. 2+ pedal pulses. No carotid bruits.  Abdomen: no tenderness, no masses palpated. No hepatosplenomegaly. Bowel sounds positive.  Musculoskeletal: no clubbing / cyanosis. No joint deformity upper and lower extremities. Good ROM, no contractures. Normal muscle tone.  Skin: no rashes, lesions, ulcers. No induration Neurologic: CN 2-12 grossly intact. Sensation intact, DTR normal. Strength 5/5 in all 4.  Psychiatric: Normal judgment and insight. Alert and oriented x 3. Normal mood.     Labs on Admission: I have personally reviewed following labs and imaging studies  CBC: Recent Labs  Lab 01/05/19 2213  WBC 3.9*  NEUTROABS 1.8  HGB 10.8*  HCT 34.2*  MCV 73.9*  PLT 665   Basic Metabolic Panel: Recent Labs  Lab 01/05/19 2213  NA 135  K 4.1  CL 105  CO2 22  GLUCOSE 126*  BUN 24*  CREATININE 2.03*  CALCIUM 8.9   GFR: Estimated Creatinine Clearance: 22.4 mL/min (A) (by C-G formula based on SCr of 2.03 mg/dL (H)). Liver Function Tests: Recent Labs  Lab 01/05/19 2213  AST 62*  ALT 26  ALKPHOS 114  BILITOT 1.3*  PROT 7.5  ALBUMIN 3.1*   No results for input(s): LIPASE, AMYLASE in the last 168 hours. No results for input(s): AMMONIA in the last 168 hours. Coagulation Profile: No results for input(s): INR, PROTIME in the last 168 hours. Cardiac Enzymes: No results for input(s): CKTOTAL, CKMB, CKMBINDEX, TROPONINI in the last 168 hours. BNP (last 3 results) No results for input(s): PROBNP in the last 8760 hours. HbA1C: No results for input(s): HGBA1C in the last 72 hours. CBG: No results for input(s): GLUCAP in the last 168 hours. Lipid Profile: No results for input(s): CHOL, HDL, LDLCALC, TRIG, CHOLHDL, LDLDIRECT in the last 72 hours. Thyroid Function Tests: Recent Labs    01/05/19 2213  TSH 2.456   Anemia  Panel: No results for input(s): VITAMINB12, FOLATE, FERRITIN, TIBC, IRON, RETICCTPCT in the last 72 hours. Urine analysis:    Component Value Date/Time   COLORURINE YELLOW 08/29/2014 2035   APPEARANCEUR CLOUDY (A) 08/29/2014 2035   LABSPEC 1.017 08/29/2014 2035   PHURINE 6.0 08/29/2014 2035   GLUCOSEU NEGATIVE 08/29/2014 2035   HGBUR SMALL (A) 08/29/2014 2035   BILIRUBINUR NEGATIVE 08/29/2014 2035   KETONESUR NEGATIVE 08/29/2014 2035   PROTEINUR NEGATIVE 08/29/2014 2035   UROBILINOGEN 1.0 08/29/2014 2035   NITRITE NEGATIVE 08/29/2014 2035   LEUKOCYTESUR TRACE (A) 08/29/2014 2035   Sepsis Labs: @LABRCNTIP (procalcitonin:4,lacticidven:4) ) Recent Results (from the past 240 hour(s))  SARS Coronavirus 2 (CEPHEID - Performed in [place] hospital lab), Hosp Order     Status: None   Collection Time: 01/05/19 10:31 PM   Specimen: Nasopharyngeal Swab  Result Value Ref Range Status   SARS Coronavirus 2 NEGATIVE NEGATIVE Final    Comment: (NOTE) If result is NEGATIVE SARS-CoV-2 target nucleic acids are NOT DETECTED. The SARS-CoV-2 RNA is generally detectable in upper and lower  respiratory specimens during the acute phase of infection. The lowest  concentration of SARS-CoV-2 viral copies this assay can detect is 250  copies / mL. A negative result does not preclude SARS-CoV-2 infection  and should not be used as the sole basis for treatment or other  patient management decisions.  A negative result may occur with  improper specimen collection / handling, submission of specimen other  than nasopharyngeal swab, presence of viral mutation(s) within the  areas targeted by this assay, and inadequate number of viral copies  (<250 copies / mL). A negative result must be combined with clinical  observations, patient history, and epidemiological information. If result is POSITIVE SARS-CoV-2 target nucleic acids are DETECTED. The SARS-CoV-2 RNA is generally detectable in upper and lower   respiratory specimens dur ing the acute phase of infection.  Positive  results are indicative of active infection with SARS-CoV-2.  Clinical  correlation with patient history and other diagnostic information is  necessary to determine patient infection status.  Positive results do  not rule out bacterial infection or co-infection with other viruses. If result is PRESUMPTIVE POSTIVE SARS-CoV-2 nucleic acids MAY  BE PRESENT.   A presumptive positive result was obtained on the submitted specimen  and confirmed on repeat testing.  While 2019 novel coronavirus  (SARS-CoV-2) nucleic acids may be present in the submitted sample  additional confirmatory testing may be necessary for epidemiological  and / or clinical management purposes  to differentiate between  SARS-CoV-2 and other Sarbecovirus currently known to infect humans.  If clinically indicated additional testing with an alternate test  methodology [phone]) is advised. The SARS-CoV-2 RNA is generally  detectable in upper and lower respiratory sp ecimens during the acute  phase of infection. The expected result is Negative. Fact Sheet for Patients:  StrictlyIdeas.no Fact Sheet for Healthcare Providers: BankingDealers.co.za This test is not yet approved or cleared by the [place] FDA and has been authorized for detection and/or diagnosis of SARS-CoV-2 by FDA under an Emergency Use Authorization (EUA).  This EUA will remain in effect (meaning this test can be used) for the duration of the COVID-19 declaration under Section 564(b)(1) of the Act, 21 U.S.C. section 360bbb-3(b)(1), unless the authorization is terminated or revoked sooner. Performed at [facility], [place] [address] [place], [place] [address]      Radiological Exams on Admission: Dg Chest Port 1 View  Result Date: 01/05/2019 CLINICAL DATA:  Shortness of breath and chest pain EXAM: PORTABLE CHEST 1 VIEW  COMPARISON:  09/02/2018 FINDINGS: Cardiac shadow is enlarged. Defibrillator is again noted and stable. Aortic calcifications are seen. Vascular congestion is noted with interstitial edema. No sizable effusion is noted. IMPRESSION: Changes of mild CHF. Electronically Signed   By: Inez Catalina M.D.   On: 01/05/2019 22:06    EKG: Independently reviewed.  It shows sinus rhythm with prolonged QT interval of 485.  Left bundle branch block.  This is unchanged from previous  Assessment/Plan Principal Problem:   Acute on chronic combined systolic and diastolic CHF (congestive heart failure) (HCC) Active Problems:   Hypertension   Nonischemic cardiomyopathy (HCC)   Hypokalemia   History of tobacco abuse   CKD (chronic kidney disease), stage III (HCC)   Chest pain   Rapid atrial fibrillation (HCC)     #1 acute exacerbation of systolic dysfunction: Patient has advanced systolic dysfunction with EF of only 25%.  We will admit the patient for aggressive diuresis.  Continue other cardiac medications.  On carvedilol.  No ACE inhibitors due to renal disease.  She appears to be less hypoxic at the moment.  Keep on oxygen.  Patient may require home O2 at discharge.  #2 atrial fibrillation: Continue Eliquis with carvedilol.  Continue amiodarone.  Patient in sinus rhythm now  #3 hyperlipidemia: Continue Lipitor.  #4 hypertension: Continue home regimen.  #5 GERD: Continue with Protonix  #6 chest pain: Most likely related to CHF exacerbation.  We will cycle the enzymes.     DVT prophylaxis: Eliquis Code Status: Full code Family Communication: Care discussed with patient fully Disposition Plan: To be determined Consults called: None but may call cardiology in the morning Admission status: Inpatient  Severity of Illness: The appropriate patient status for this patient is INPATIENT. Inpatient status is judged to be reasonable and necessary in order to provide the required intensity of service to  ensure the patient's safety. The patient's presenting symptoms, physical exam findings, and initial radiographic and laboratory data in the context of their chronic comorbidities is felt to place them at high risk for further clinical deterioration. Furthermore, it is not anticipated that the patient will be medically  stable for discharge from the hospital within 2 midnights of admission. The following factors support the patient status of inpatient.   " The patient's presenting symptoms include shortness of breath and chest pain. " The worrisome physical exam findings include large systolic murmur. " The initial radiographic and laboratory data are worrisome because of chest x-ray showing CHF. " The chronic co-morbidities include systolic dysfunction CHF.   * I certify that at the point of admission it is my clinical judgment that the patient will require inpatient hospital care spanning beyond 2 midnights from the point of admission due to high intensity of service, high risk for further deterioration and high frequency of surveillance required.Barbette Merino MD [facility] Pager [phone]  If 7PM-7AM, please contact night-coverage [email or web address] Password [facility]  01/06/2019, 12:41 AM

## 2019-01-06 NOTE — Plan of Care (Signed)
Chf plan of care implemented.

## 2019-01-07 ENCOUNTER — Inpatient Hospital Stay (HOSPITAL_COMMUNITY): Payer: Medicare Other

## 2019-01-07 DIAGNOSIS — Z87891 Personal history of nicotine dependence: Secondary | ICD-10-CM

## 2019-01-07 DIAGNOSIS — N183 Chronic kidney disease, stage 3 (moderate): Secondary | ICD-10-CM

## 2019-01-07 DIAGNOSIS — I428 Other cardiomyopathies: Secondary | ICD-10-CM

## 2019-01-07 DIAGNOSIS — I10 Essential (primary) hypertension: Secondary | ICD-10-CM

## 2019-01-07 DIAGNOSIS — E876 Hypokalemia: Secondary | ICD-10-CM

## 2019-01-07 DIAGNOSIS — I340 Nonrheumatic mitral (valve) insufficiency: Secondary | ICD-10-CM

## 2019-01-07 LAB — BASIC METABOLIC PANEL
Anion gap: 10 (ref 5–15)
BUN: 28 mg/dL — ABNORMAL HIGH (ref 8–23)
CO2: 24 mmol/L (ref 22–32)
Calcium: 8.6 mg/dL — ABNORMAL LOW (ref 8.9–10.3)
Chloride: 103 mmol/L (ref 98–111)
Creatinine, Ser: 2.46 mg/dL — ABNORMAL HIGH (ref 0.44–1.00)
GFR calc Af Amer: 21 mL/min — ABNORMAL LOW (ref >60)
GFR calc non Af Amer: 19 mL/min — ABNORMAL LOW (ref >60)
Glucose, Bld: 95 mg/dL (ref 70–99)
Potassium: 3.9 mmol/L (ref 3.5–5.1)
Sodium: 137 mmol/L (ref 135–145)

## 2019-01-07 LAB — ECHOCARDIOGRAM COMPLETE
Height: 66 in
Weight: 2233.6 oz

## 2019-01-07 LAB — SEDIMENTATION RATE: Sed Rate: 22 mm/hr (ref 0–22)

## 2019-01-07 LAB — C-REACTIVE PROTEIN: CRP: <0.8 mg/dL (ref <1.0)

## 2019-01-07 MED ORDER — ZOLPIDEM TARTRATE 5 MG PO TABS
5.0000 mg | ORAL_TABLET | Freq: Once | ORAL | Status: AC
  Administered 2019-01-08: 5 mg via ORAL
  Filled 2019-01-07: qty 1

## 2019-01-07 NOTE — Evaluation (Signed)
Physical Therapy Evaluation Patient Details Name: Stephanie Yates MRN: [id number] DOB: June 24, 1946 Today's Date: 01/07/2019   History of Present Illness  73 yo admitted with acute on chronic CHF. PMhx: HTN, HLD, CKD, Afib, NICM  Clinical Impression  Pt pleasant and reports some vague abdominal pain like a gas cramp limiting function. Pt lives alone and work in [employer]. Pt demonstrates decreased balance, activity tolerance, gait and function who will benefit from acute therapy to maximize mobility, safety and independence to decrease burden of care.   HR 72-90 SpO2 90-93% on RA    Follow Up Recommendations Home health PT    Equipment Recommendations  Rolling walker with 5" wheels    Recommendations for Other Services OT consult     Precautions / Restrictions Precautions Precautions: Fall      Mobility  Bed Mobility Overal bed mobility: Modified Independent                Transfers Overall transfer level: Modified independent                  Ambulation/Gait Ambulation/Gait assistance: Min guard Gait Distance (Feet): 120 Feet Assistive device: None;1 person hand held assist Gait Pattern/deviations: Step-through pattern;Decreased stride length;Narrow base of support   Gait velocity interpretation: 1.31 - 2.62 ft/sec, indicative of limited community ambulator General Gait Details: pt with slow, cautious gait with HHA x 50' with recommendation for RW with gait to increase activity tolerance and balance in standing. Pt with decreased speed from baseline  Stairs            Wheelchair Mobility    Modified Rankin (Stroke Patients Only)       Balance Overall balance assessment: Needs assistance   Sitting balance-Leahy Scale: Good       Standing balance-Leahy Scale: Fair                               Pertinent Vitals/Pain Pain Assessment: 0-10 Pain Score: 3  Pain Location: abdominal pain Pain  Descriptors / Indicators: Aching Pain Intervention(s): Limited activity within patient's tolerance;Monitored during session;Repositioned    Home Living Family/patient expects to be discharged to:: Private residence Living Arrangements: Alone Available Help at Discharge: Family;Available PRN/intermittently Type of Home: House Home Access: Stairs to enter Entrance Stairs-Rails: Psychiatric nurse of Steps: 2 Home Layout: One level Home Equipment: Walker - 2 wheels;Cane - single point      Prior Function Level of Independence: Independent         Comments: works in [employer]        Extremity/Trunk Assessment   Upper Extremity Assessment Upper Extremity Assessment: Generalized weakness    Lower Extremity Assessment Lower Extremity Assessment: Generalized weakness;RLE deficits/detail RLE Deficits / Details: pt with decreased ROm and strength with painful knee for grossly 2 years    Cervical / Trunk Assessment Cervical / Trunk Assessment: Kyphotic  Communication   Communication: No difficulties  Cognition Arousal/Alertness: Awake/alert Behavior During Therapy: WFL for tasks assessed/performed Overall Cognitive Status: Within Functional Limits for tasks assessed                                        General Comments      Exercises General Exercises - Lower Extremity Long Arc Quad: AROM;10 reps;Both;Seated Hip Flexion/Marching: AROM;Both;10  reps;Seated   Assessment/Plan    PT Assessment Patient needs continued PT services  PT Problem List Decreased strength;Decreased mobility;Decreased activity tolerance;Decreased balance;Decreased knowledge of use of DME;Decreased safety awareness       PT Treatment Interventions DME instruction;Therapeutic activities;Gait training;Therapeutic exercise;Patient/family education;Functional mobility training;Stair training    PT Goals (Current goals can be found in the  Care Plan section)  Acute Rehab PT Goals Patient Stated Goal: return home to cook and hopefully to work PT Goal Formulation: With patient Time For Goal Achievement: 01/21/19 Potential to Achieve Goals: Good    Frequency Min 3X/week   Barriers to discharge Decreased caregiver support      Co-evaluation               AM-PAC PT "6 Clicks" Mobility  Outcome Measure Help needed turning from your back to your side while in a flat bed without using bedrails?: None Help needed moving from lying on your back to sitting on the side of a flat bed without using bedrails?: None Help needed moving to and from a bed to a chair (including a wheelchair)?: None Help needed standing up from a chair using your arms (e.g., wheelchair or bedside chair)?: None Help needed to walk in hospital room?: A Little Help needed climbing 3-5 steps with a railing? : A Little 6 Click Score: 22    End of Session Equipment Utilized During Treatment: Gait belt Activity Tolerance: Patient tolerated treatment well Patient left: in chair;with call bell/phone within reach Nurse Communication: Mobility status PT Visit Diagnosis: Other abnormalities of gait and mobility (R26.89);Muscle weakness (generalized) (M62.81)    Time: 8377-9396 PT Time Calculation (min) (ACUTE ONLY): 17 min   Charges:   PT Evaluation $PT Eval Moderate Complexity: 1 Mod          Cook, PT Acute Rehabilitation Services Pager: [phone] Office: [phone]   Chane Magner B Bartolo Montanye 01/07/2019, 1:15 PM

## 2019-01-07 NOTE — Progress Notes (Signed)
PROGRESS NOTE    Stephanie Yates  GUR:[id number] DOB: 08/09/1945 DOA: 01/05/2019 PCP: Josetta Huddle, MD   Brief Narrative: Stephanie Yates is a 73 y.o. female with medical history significant of atrial fibrillation, systolic dysfunction CHF with EF of 25%, tobacco abuse, CKD III, hyperlipidemia and hypertension. She presented secondary to worsening dyspnea and chest discomfort concerning for acute on chronic heart failure. She has been started on IV diuresis.   Assessment & Plan:   Principal Problem:   Acute on chronic combined systolic and diastolic CHF (congestive heart failure) (HCC) Active Problems:   Hypertension   Nonischemic cardiomyopathy (HCC)   Hypokalemia   History of tobacco abuse   CKD (chronic kidney disease), stage III (HCC)   Chest pain   Rapid atrial fibrillation (HCC)   Acute on chronic combined systolic and diastolic heart failure Nonischemic cardiomyopathy. Last EF of 20-25% in 05/2018. Last weight of 145 lbs about three weeks prior. BNP of 1129 on admission. Not at functional baseline secondary to dyspnea. Worsening kidney function in setting of IV diuresis. -Discontinue Lasix -Strict in and out/daily weights -Transthoracic Echocardiogram ordered today -PT/OT eval pending  Dyspnea Presumed secondary to heart failure but it is possible this could be secondary to amiodarone. No hypoxia noted. Functionally below baseline. -ESR and CRP -O2 as needed to keep saturations >90%  Essential hypertension Well controlled.  Atrial fibrillation Patient recently increased to amiodarone 400 mg BID. Patient is also on Eliquis and Coreg -Continue amiodarone, Coreg, Eliquis  Hyperlipidemia -Continue Lipitor  AKI on CKD stage III Likely secondary to heart failure. Baseline of around 1.6-1.7. Creatinine of 2.03 on admission and increased to 2.46 with diuresis -Holding lasix today   DVT prophylaxis: Eliquis Code Status:   Code Status: Full Code Family  Communication: None Disposition Plan: Discharge likely home in 24-48 hours pending improvement of breathing in addition to kidney function   Consultants:   None  Procedures:   None  Antimicrobials:  None    Subjective: Improved dyspnea but still not close to baseline.  Objective: Vitals:   01/06/19 0830 01/06/19 1316 01/06/19 2103 01/07/19 0528  BP: 113/62 104/65 118/70 107/62  Pulse: 70 70 70 69  Resp: 20 18 18 18   Temp: 97.7 F (36.5 C) 98 F (36.7 C) 97.9 F (36.6 C) 98.8 F (37.1 C)  TempSrc: Oral Oral Oral Oral  SpO2: 100% 97% 95% 93%  Weight:    63.3 kg  Height:        Intake/Output Summary (Last 24 hours) at 01/07/2019 0912 Last data filed at 01/07/2019 0630 Gross per 24 hour  Intake 600 ml  Output 2200 ml  Net -1600 ml   Filed Weights   01/05/19 2057 01/06/19 0118 01/07/19 0528  Weight: 63.5 kg 63.5 kg 63.3 kg    Examination:  General exam: Appears calm and comfortable Respiratory system: Mild rales. Respiratory effort normal. Cardiovascular system: S1 & S2 heard, RRR. No murmurs, rubs, gallops or clicks. Gastrointestinal system: Abdomen is nondistended, soft and nontender. No organomegaly or masses felt. Normal bowel sounds heard. Central nervous system: Alert and oriented. No focal neurological deficits. Extremities: No edema. No calf tenderness Skin: No cyanosis. No rashes Psychiatry: Judgement and insight appear normal. Mood & affect appropriate.       Data Reviewed: I have personally reviewed following labs and imaging studies  CBC: Recent Labs  Lab 01/05/19 2213 01/06/19 0241  WBC 3.9* 5.5  NEUTROABS 1.8 2.3  HGB 10.8* 11.0*  HCT 34.2* 34.6*  MCV 73.9* 74.2*  PLT 196 537   Basic Metabolic Panel: Recent Labs  Lab 01/05/19 2213 01/07/19 0337  NA 135 137  K 4.1 3.9  CL 105 103  CO2 22 24  GLUCOSE 126* 95  BUN 24* 28*  CREATININE 2.03* 2.46*  CALCIUM 8.9 8.6*   GFR: Estimated Creatinine Clearance: 18.5 mL/min (A) (by  C-G formula based on SCr of 2.46 mg/dL (H)). Liver Function Tests: Recent Labs  Lab 01/05/19 2213  AST 62*  ALT 26  ALKPHOS 114  BILITOT 1.3*  PROT 7.5  ALBUMIN 3.1*   No results for input(s): LIPASE, AMYLASE in the last 168 hours. No results for input(s): AMMONIA in the last 168 hours. Coagulation Profile: No results for input(s): INR, PROTIME in the last 168 hours. Cardiac Enzymes: No results for input(s): CKTOTAL, CKMB, CKMBINDEX, TROPONINI in the last 168 hours. BNP (last 3 results) No results for input(s): PROBNP in the last 8760 hours. HbA1C: No results for input(s): HGBA1C in the last 72 hours. CBG: No results for input(s): GLUCAP in the last 168 hours. Lipid Profile: No results for input(s): CHOL, HDL, LDLCALC, TRIG, CHOLHDL, LDLDIRECT in the last 72 hours. Thyroid Function Tests: Recent Labs    01/05/19 2213  TSH 2.456   Anemia Panel: No results for input(s): VITAMINB12, FOLATE, FERRITIN, TIBC, IRON, RETICCTPCT in the last 72 hours. Sepsis Labs: No results for input(s): PROCALCITON, LATICACIDVEN in the last 168 hours.  Recent Results (from the past 240 hour(s))  SARS Coronavirus 2 (CEPHEID - Performed in [place] hospital lab), Hosp Order     Status: None   Collection Time: 01/05/19 10:31 PM   Specimen: Nasopharyngeal Swab  Result Value Ref Range Status   SARS Coronavirus 2 NEGATIVE NEGATIVE Final    Comment: (NOTE) If result is NEGATIVE SARS-CoV-2 target nucleic acids are NOT DETECTED. The SARS-CoV-2 RNA is generally detectable in upper and lower  respiratory specimens during the acute phase of infection. The lowest  concentration of SARS-CoV-2 viral copies this assay can detect is 250  copies / mL. A negative result does not preclude SARS-CoV-2 infection  and should not be used as the sole basis for treatment or other  patient management decisions.  A negative result may occur with  improper specimen collection / handling, submission of specimen other   than nasopharyngeal swab, presence of viral mutation(s) within the  areas targeted by this assay, and inadequate number of viral copies  (<250 copies / mL). A negative result must be combined with clinical  observations, patient history, and epidemiological information. If result is POSITIVE SARS-CoV-2 target nucleic acids are DETECTED. The SARS-CoV-2 RNA is generally detectable in upper and lower  respiratory specimens dur ing the acute phase of infection.  Positive  results are indicative of active infection with SARS-CoV-2.  Clinical  correlation with patient history and other diagnostic information is  necessary to determine patient infection status.  Positive results do  not rule out bacterial infection or co-infection with other viruses. If result is PRESUMPTIVE POSTIVE SARS-CoV-2 nucleic acids MAY BE PRESENT.   A presumptive positive result was obtained on the submitted specimen  and confirmed on repeat testing.  While 2019 novel coronavirus  (SARS-CoV-2) nucleic acids may be present in the submitted sample  additional confirmatory testing may be necessary for epidemiological  and / or clinical management purposes  to differentiate between  SARS-CoV-2 and other Sarbecovirus currently known to infect humans.  If clinically indicated additional testing with an alternate  test  methodology [id number]) is advised. The SARS-CoV-2 RNA is generally  detectable in upper and lower respiratory sp ecimens during the acute  phase of infection. The expected result is Negative. Fact Sheet for Patients:  StrictlyIdeas.no Fact Sheet for Healthcare Providers: BankingDealers.co.za This test is not yet approved or cleared by the [place] FDA and has been authorized for detection and/or diagnosis of SARS-CoV-2 by FDA under an Emergency Use Authorization (EUA).  This EUA will remain in effect (meaning this test can be used) for the duration of the  COVID-19 declaration under Section 564(b)(1) of the Act, 21 U.S.C. section 360bbb-3(b)(1), unless the authorization is terminated or revoked sooner. Performed at [facility], [place] [address], [place], [place] [address]          Radiology Studies: Dg Chest Port 1 View  Result Date: 01/07/2019 CLINICAL DATA:  Short of breath.  History of CHF. EXAM: PORTABLE CHEST 1 VIEW COMPARISON:  01/05/2019 and older exams. FINDINGS: Mild to moderate enlargement of the cardiopericardial silhouette. No mediastinal or hilar masses. No convincing adenopathy. Prominent bronchovascular markings with additional lung base reticular opacities chronic and similar to prior exams. No evidence of pneumonia or pulmonary edema. No pleural effusion or pneumothorax. Left anterior chest wall sequential pacemaker is stable. Skeletal structures are grossly intact. IMPRESSION: 1. No acute cardiopulmonary disease. 2. Stable mild-to-moderate cardiomegaly. Electronically Signed   By: Lajean Manes M.D.   On: 01/07/2019 08:27   Dg Chest Port 1 View  Result Date: 01/05/2019 CLINICAL DATA:  Shortness of breath and chest pain EXAM: PORTABLE CHEST 1 VIEW COMPARISON:  09/02/2018 FINDINGS: Cardiac shadow is enlarged. Defibrillator is again noted and stable. Aortic calcifications are seen. Vascular congestion is noted with interstitial edema. No sizable effusion is noted. IMPRESSION: Changes of mild CHF. Electronically Signed   By: Inez Catalina M.D.   On: 01/05/2019 22:06        Scheduled Meds: . amiodarone  400 mg Oral BID  . apixaban  2.5 mg Oral BID  . atorvastatin  40 mg Oral q1800  . carvedilol  3.125 mg Oral BID WC  . cholecalciferol  1,000 Units Oral QHS  . ferrous gluconate  324 mg Oral BID  . magnesium oxide  400 mg Oral Daily  . pantoprazole  40 mg Oral BID AC  . potassium chloride SA  20 mEq Oral Daily  . sodium chloride flush  3 mL Intravenous Q12H   Continuous Infusions: . sodium chloride       LOS: 1  day     Cordelia Poche, MD [facility] 01/07/2019, 9:12 AM  If 7PM-7AM, please contact night-coverage [email or web address]

## 2019-01-07 NOTE — Evaluation (Signed)
Occupational Therapy Evaluation Patient Details Name: Stephanie Yates MRN: [id number] DOB: 07/26/1945 Today's Date: 01/07/2019    History of Present Illness 73 yo admitted with acute on chronic CHF. PMhx: HTN, HLD, CKD, Afib, NICM   Clinical Impression   PTA patient independent and working. Admitted for above and limited by problem list below, including impaired balance, decreased activity tolerance and generalized weakness. Pt able to complete LB ADLs with min assist, transfers with min assist, grooming standing at sink with min guard and in room mobility with min guard assist.  Mobility with hand held assist at times, pt fatigued and reaching out for UE support in room. VSS during session.  She will benefit from continued OT services while admitted and after dc at [facility] level in order to maximize strength, tolerance and independence with ADLs/mobility.     Follow Up Recommendations  Home health OT    Equipment Recommendations  3 in 1 bedside commode    Recommendations for Other Services       Precautions / Restrictions Precautions Precautions: Fall      Mobility Bed Mobility Overal bed mobility: Modified Independent             General bed mobility comments: OOB upon entry   Transfers Overall transfer level: Needs assistance Equipment used: None Transfers: Sit to/from Stand Sit to Stand: Min assist         General transfer comment: requires min assist to power up from chair and recliner due to fatigue     Balance Overall balance assessment: Needs assistance   Sitting balance-Leahy Scale: Good     Standing balance support: No upper extremity supported;During functional activity;Single extremity supported Standing balance-Leahy Scale: Fair Standing balance comment: pt reaching out for UE support with mobility, able to stand and grooming with 0 hand support given min guard                            ADL either performed or assessed with clinical  judgement   ADL Overall ADL's : Needs assistance/impaired     Grooming: Min guard;Standing   Upper Body Bathing: Set up;Sitting   Lower Body Bathing: Minimal assistance;Sit to/from stand   Upper Body Dressing : Set up;Sitting   Lower Body Dressing: Minimal assistance;Sit to/from stand   Toilet Transfer: Minimal assistance;Ambulation Toilet Transfer Details (indicate cue type and reason): simulated to recliner          Functional mobility during ADLs: Minimal assistance General ADL Comments: pt limited by generalized weakness, decreased activity tolerance     Vision         Perception     Praxis      Pertinent Vitals/Pain Pain Assessment: No/denies pain Pain Score: 3  Pain Location: abdominal pain Pain Descriptors / Indicators: Aching Pain Intervention(s): Limited activity within patient's tolerance;Monitored during session;Repositioned     Hand Dominance Right   Extremity/Trunk Assessment Upper Extremity Assessment Upper Extremity Assessment: Generalized weakness   Lower Extremity Assessment Lower Extremity Assessment: Defer to PT evaluation RLE Deficits / Details: pt with decreased ROm and strength with painful knee for grossly 2 years   Cervical / Trunk Assessment Cervical / Trunk Assessment: Kyphotic   Communication Communication Communication: No difficulties   Cognition Arousal/Alertness: Awake/alert Behavior During Therapy: WFL for tasks assessed/performed Overall Cognitive Status: Within Functional Limits for tasks assessed  General Comments  VSS, HR 75/85 SpO2 92-93% RA    Exercises General Exercises - Lower Extremity Long Arc Quad: AROM;10 reps;Both;Seated Hip Flexion/Marching: AROM;Both;10 reps;Seated   Shoulder Instructions      Home Living Family/patient expects to be discharged to:: Private residence Living Arrangements: Alone Available Help at Discharge: Family;Available  PRN/intermittently Type of Home: House Home Access: Stairs to enter [employer] of Steps: 2 Entrance Stairs-Rails: Right;Left Home Layout: One level     Bathroom Shower/Tub: Teacher, early years/pre: Standard     Home Equipment: Environmental consultant - 2 wheels;Cane - single point          Prior Functioning/Environment Level of Independence: Independent        Comments: works in Artist Problem List: Decreased strength;Decreased activity tolerance;Impaired balance (sitting and/or standing);Decreased knowledge of use of DME or AE;Cardiopulmonary status limiting activity      OT Treatment/Interventions: Self-care/ADL training;Therapeutic exercise;DME and/or AE instruction;Therapeutic activities;Patient/family education;Balance training    OT Goals(Current goals can be found in the care plan section) Acute Rehab OT Goals Patient Stated Goal: return home to cook and hopefully to work OT Goal Formulation: With patient Time For Goal Achievement: 01/21/19 Potential to Achieve Goals: Good  OT Frequency: Min 2X/week   Barriers to D/C:            Co-evaluation              AM-PAC OT "6 Clicks" Daily Activity     Outcome Measure Help from another person eating meals?: None Help from another person taking care of personal grooming?: A Little Help from another person toileting, which includes using toliet, bedpan, or urinal?: A Little Help from another person bathing (including washing, rinsing, drying)?: A Little Help from another person to put on and taking off regular upper body clothing?: None Help from another person to put on and taking off regular lower body clothing?: A Little 6 Click Score: 20   End of Session Equipment Utilized During Treatment: Gait belt Nurse Communication: Mobility status  Activity Tolerance: Patient tolerated treatment well Patient left: in chair;with call bell/phone within reach  OT Visit Diagnosis:  Unsteadiness on feet (R26.81);Muscle weakness (generalized) (M62.81)                Time: 0092-3300 OT Time Calculation (min): 15 min Charges:  OT General Charges $OT Visit: 1 Visit OT Evaluation $OT Eval Moderate Complexity: [place], OT Acute Rehabilitation Services Pager [phone] Office [phone]   Delight Stare 01/07/2019, 1:55 PM

## 2019-01-07 NOTE — Care Management (Addendum)
CM spoke with pt via phone.  Pt informed CM that she independent from home alone - daughter lives close by and will serve as pts support person as needed.  Pt has PCP and denied barriers with paying for medications.  Pt declined both HH and DME as recommended by [facility].  Pt educated on importance of daily weights and low sodium diet - pt has scale in the home.  CM will continue to follow for discharge needs

## 2019-01-07 NOTE — Progress Notes (Signed)
  Echocardiogram 2D Echocardiogram has been performed.  Stephanie Yates 01/07/2019, 4:45 PM

## 2019-01-08 ENCOUNTER — Encounter (HOSPITAL_COMMUNITY): Payer: Self-pay | Admitting: General Practice

## 2019-01-08 LAB — BASIC METABOLIC PANEL
Anion gap: 8 (ref 5–15)
BUN: 34 mg/dL — ABNORMAL HIGH (ref 8–23)
CO2: 23 mmol/L (ref 22–32)
Calcium: 8.9 mg/dL (ref 8.9–10.3)
Chloride: 106 mmol/L (ref 98–111)
Creatinine, Ser: 2.17 mg/dL — ABNORMAL HIGH (ref 0.44–1.00)
GFR calc Af Amer: 25 mL/min — ABNORMAL LOW (ref >60)
GFR calc non Af Amer: 22 mL/min — ABNORMAL LOW (ref >60)
Glucose, Bld: 86 mg/dL (ref 70–99)
Potassium: 4.2 mmol/L (ref 3.5–5.1)
Sodium: 137 mmol/L (ref 135–145)

## 2019-01-08 MED ORDER — ALUM & MAG HYDROXIDE-SIMETH 200-200-20 MG/5ML PO SUSP
30.0000 mL | Freq: Once | ORAL | Status: AC
  Administered 2019-01-08: 30 mL via ORAL
  Filled 2019-01-08: qty 30

## 2019-01-08 MED ORDER — ALUM & MAG HYDROXIDE-SIMETH 200-200-20 MG/5ML PO SUSP: 30.0000 mL | Freq: Four times a day (QID) | ORAL | Status: DC | PRN

## 2019-01-08 MED ORDER — ZOLPIDEM TARTRATE 5 MG PO TABS
5.0000 mg | ORAL_TABLET | Freq: Every evening | ORAL | Status: DC | PRN
  Administered 2019-01-08 – 2019-01-11 (×2): 5 mg via ORAL
  Filled 2019-01-08 (×2): qty 1

## 2019-01-08 NOTE — Progress Notes (Signed)
Patient has questionable 12 beat of VT, pt. Asymptomatic, v/s stable, pt. Denies CP, MD notified via text, no new order given. Will continue to monitor the patient

## 2019-01-08 NOTE — Progress Notes (Signed)
Occupational Therapy Treatment Patient Details Name: Stephanie Yates MRN: [id number] DOB: 06/29/1945 Today's Date: 01/08/2019    History of present illness 73 yo admitted with acute on chronic CHF. PMhx: HTN, HLD, CKD, Afib, NICM   OT comments  Patient progressing slowly.  Continues to be limited by decreased activity tolerance and generalized weakness. Patient requires min guard to supervision for safety, but requires maximal seated rest breaks. Endorses need for increased assist due to poor tolerance, but declines use of RW today. Discussed benefits of RW for safety and tolerance, initiated education on energy conservation techniques.  Recommend assist for mobility, IADLs and ADLs at dc and pt reports daughter can assist intermittently.  Will follow.    Follow Up Recommendations  Home health OT;Supervision - Intermittent(supervision with ADLs, IADLs, mobility)    Equipment Recommendations  3 in 1 bedside commode    Recommendations for Other Services      Precautions / Restrictions Precautions Precautions: Fall       Mobility Bed Mobility               General bed mobility comments: EOB upon entry  Transfers Overall transfer level: Needs assistance   Transfers: Sit to/from Stand Sit to Stand: Min guard;Supervision         General transfer comment: min guard to supervision, dependent on fatigue level and height of surface (min guard from commode)    Balance Overall balance assessment: Needs assistance Sitting-balance support: No upper extremity supported;Feet supported Sitting balance-Leahy Scale: Good     Standing balance support: No upper extremity supported;During functional activity Standing balance-Leahy Scale: Fair Standing balance comment: statically able to stand with 0 hand support, but reliant on support dynamically (reaching out for support)                           ADL either performed or assessed with clinical judgement   ADL  Overall ADL's : Needs assistance/impaired     Grooming: Supervision/safety;Standing;Sitting Grooming Details (indicate cue type and reason): initates standing, but fatigues and requires to complete seated Upper Body Bathing: Set up;Sitting Upper Body Bathing Details (indicate cue type and reason): washing seated at sink  Lower Body Bathing: Supervison/ safety;Sit to/from stand Lower Body Bathing Details (indicate cue type and reason): supervision for safety, decreased act tolerance (washing peri/buttocks area only and requires 2 seated rest breaks) Upper Body Dressing : Set up;Sitting Upper Body Dressing Details (indicate cue type and reason): donning new gown Lower Body Dressing: Min guard;Sit to/from stand Lower Body Dressing Details (indicate cue type and reason): donning socks using figure 4 technique but min guard sit to stand  Toilet Transfer: Min guard;Ambulation;Grab Information systems manager Details (indicate cue type and reason): hand held assist  Toileting- Water quality scientist and Hygiene: Min guard;Sit to/from stand       Functional mobility during ADLs: Min guard(pt declined use of RW )       Vision       Perception     Praxis      Cognition Arousal/Alertness: Awake/alert Behavior During Therapy: WFL for tasks assessed/performed Overall Cognitive Status: Within Functional Limits for tasks assessed                                 General Comments: good awareness of deficits and increased need of assist, but hesistant to use RW  Exercises     Shoulder Instructions       General Comments VSS, HR 75 SpO2 94 RA; initated education on energy conservation techniques     Pertinent Vitals/ Pain       Pain Assessment: No/denies pain  Home Living                                          Prior Functioning/Environment              Frequency  Min 2X/week        Progress Toward Goals  OT  Goals(current goals can now be found in the care plan section)  Progress towards OT goals: Progressing toward goals  Acute Rehab OT Goals Patient Stated Goal: return home to cook and hopefully to work OT Goal Formulation: With patient  Plan Discharge plan remains appropriate;Frequency remains appropriate    Co-evaluation                 AM-PAC OT "6 Clicks" Daily Activity     Outcome Measure   Help from another person eating meals?: None Help from another person taking care of personal grooming?: None Help from another person toileting, which includes using toliet, bedpan, or urinal?: None Help from another person bathing (including washing, rinsing, drying)?: A Little Help from another person to put on and taking off regular upper body clothing?: None Help from another person to put on and taking off regular lower body clothing?: A Little 6 Click Score: 22    End of Session Equipment Utilized During Treatment: Gait belt  OT Visit Diagnosis: Unsteadiness on feet (R26.81);Muscle weakness (generalized) (M62.81)   Activity Tolerance Patient tolerated treatment well   Patient Left in chair;with call bell/phone within reach   Nurse Communication Mobility status        Time: 6270-3500 OT Time Calculation (min): 22 min  Charges: OT General Charges $OT Visit: 1 Visit OT Treatments $Self Care/Home Management : 8-22 mins  Delight Stare, [place] Pager [phone] Office [phone]    Delight Stare 01/08/2019, 5:02 PM

## 2019-01-08 NOTE — Progress Notes (Signed)
PROGRESS NOTE    Stephanie Yates  ZOX:[id number] DOB: 07-13-1945 DOA: 01/05/2019 PCP: Josetta Huddle, MD   Brief Narrative: Stephanie Yates is a 73 y.o. female with medical history significant of atrial fibrillation, systolic dysfunction CHF with EF of 25%, tobacco abuse, CKD III, hyperlipidemia and hypertension. She presented secondary to worsening dyspnea and chest discomfort concerning for acute on chronic heart failure. She has been started on IV diuresis.   Assessment & Plan:   Principal Problem:   Acute on chronic combined systolic and diastolic CHF (congestive heart failure) (HCC) Active Problems:   Hypertension   Nonischemic cardiomyopathy (HCC)   Hypokalemia   History of tobacco abuse   CKD (chronic kidney disease), stage III (HCC)   Chest pain   Rapid atrial fibrillation (HCC)   Acute on chronic combined systolic and diastolic heart failure Nonischemic cardiomyopathy. Last EF of 20-25% in 05/2018. Last weight of 145 lbs about three weeks prior. BNP of 1129 on admission. Not at functional baseline secondary to dyspnea. Worsening kidney function in setting of IV diuresis. Transthoracic Echocardiogram significant for a new EF of about 15%. -Lasix held secondary to worsening kidney function, will likely restart home regimen in AM -Strict in and out/daily weights -PT/OT recommendations: HHPT/OT, rolling walker, 3 in 1 -Ambulatory pulse ox  Dyspnea Presumed secondary to heart failure but it is possible this could be secondary to amiodarone. No hypoxia noted. Functionally below baseline. Normal ESR and CRP which is reassuring. Looking more like acute heart failure -O2 as needed to keep saturations >90%  Essential hypertension Well controlled.  Atrial fibrillation Patient recently increased to amiodarone 400 mg BID. Patient is also on Eliquis and Coreg -Continue amiodarone, Coreg, Eliquis  Hyperlipidemia -Continue Lipitor  AKI on CKD stage III Likely secondary to heart  failure. Baseline of around 1.6-1.7. Creatinine of 2.03 on admission and increased to peak of 2.46 with diuresis. Trending down -Continue holding lasix today  Epigastric pain -Trial of GI cocktail. If no improvement, will obtain hepatic function panel and possible ultrasound   DVT prophylaxis: Eliquis Code Status:   Code Status: Full Code Family Communication: None Disposition Plan: Discharge likely home in 24 hours pending improvement of breathing in addition to kidney function   Consultants:   None  Procedures:   None  Antimicrobials:  None    Subjective: Breathing continues to improve. Near baseline  Objective: Vitals:   01/07/19 1332 01/07/19 1950 01/08/19 0444 01/08/19 1003  BP: (!) 101/56 116/64 (!) 157/63 103/64  Pulse: 70 69 70 70  Resp: 18 20 18    Temp: (!) 97.5 F (36.4 C) 98.9 F (37.2 C) 97.7 F (36.5 C) 98.3 F (36.8 C)  TempSrc: Oral Oral Oral Oral  SpO2: 95% 93% 92% 96%  Weight:   63.7 kg   Height:        Intake/Output Summary (Last 24 hours) at 01/08/2019 1207 Last data filed at 01/08/2019 0900 Gross per 24 hour  Intake 420 ml  Output 800 ml  Net -380 ml   Filed Weights   01/06/19 0118 01/07/19 0528 01/08/19 0444  Weight: 63.5 kg 63.3 kg 63.7 kg    Examination:  General exam: Appears calm and comfortable Respiratory system: Diminished breath sounds. Respiratory effort normal. Cardiovascular system: S1 & S2 heard, RRR. No murmurs, rubs, gallops or clicks. Gastrointestinal system: Abdomen is nondistended, soft and nontender. No organomegaly or masses felt. Normal bowel sounds heard. Central nervous system: Alert and oriented. No focal neurological deficits. Extremities: No edema.  No calf tenderness Skin: No cyanosis. No rashes Psychiatry: Judgement and insight appear normal. Mood & affect appropriate.     Data Reviewed: I have personally reviewed following labs and imaging studies  CBC: Recent Labs  Lab 01/05/19 2213 01/06/19  0241  WBC 3.9* 5.5  NEUTROABS 1.8 2.3  HGB 10.8* 11.0*  HCT 34.2* 34.6*  MCV 73.9* 74.2*  PLT 196 379   Basic Metabolic Panel: Recent Labs  Lab 01/05/19 2213 01/07/19 0337 01/08/19 0739  NA 135 137 137  K 4.1 3.9 4.2  CL 105 103 106  CO2 22 24 23   GLUCOSE 126* 95 86  BUN 24* 28* 34*  CREATININE 2.03* 2.46* 2.17*  CALCIUM 8.9 8.6* 8.9   GFR: Estimated Creatinine Clearance: 21 mL/min (A) (by C-G formula based on SCr of 2.17 mg/dL (H)). Liver Function Tests: Recent Labs  Lab 01/05/19 2213  AST 62*  ALT 26  ALKPHOS 114  BILITOT 1.3*  PROT 7.5  ALBUMIN 3.1*   No results for input(s): LIPASE, AMYLASE in the last 168 hours. No results for input(s): AMMONIA in the last 168 hours. Coagulation Profile: No results for input(s): INR, PROTIME in the last 168 hours. Cardiac Enzymes: No results for input(s): CKTOTAL, CKMB, CKMBINDEX, TROPONINI in the last 168 hours. BNP (last 3 results) No results for input(s): PROBNP in the last 8760 hours. HbA1C: No results for input(s): HGBA1C in the last 72 hours. CBG: No results for input(s): GLUCAP in the last 168 hours. Lipid Profile: No results for input(s): CHOL, HDL, LDLCALC, TRIG, CHOLHDL, LDLDIRECT in the last 72 hours. Thyroid Function Tests: Recent Labs    01/05/19 2213  TSH 2.456   Anemia Panel: No results for input(s): VITAMINB12, FOLATE, FERRITIN, TIBC, IRON, RETICCTPCT in the last 72 hours. Sepsis Labs: No results for input(s): PROCALCITON, LATICACIDVEN in the last 168 hours.  Recent Results (from the past 240 hour(s))  SARS Coronavirus 2 (CEPHEID - Performed in [place] hospital lab), Hosp Order     Status: None   Collection Time: 01/05/19 10:31 PM   Specimen: Nasopharyngeal Swab  Result Value Ref Range Status   SARS Coronavirus 2 NEGATIVE NEGATIVE Final    Comment: (NOTE) If result is NEGATIVE SARS-CoV-2 target nucleic acids are NOT DETECTED. The SARS-CoV-2 RNA is generally detectable in upper and lower   respiratory specimens during the acute phase of infection. The lowest  concentration of SARS-CoV-2 viral copies this assay can detect is 250  copies / mL. A negative result does not preclude SARS-CoV-2 infection  and should not be used as the sole basis for treatment or other  patient management decisions.  A negative result may occur with  improper specimen collection / handling, submission of specimen other  than nasopharyngeal swab, presence of viral mutation(s) within the  areas targeted by this assay, and inadequate number of viral copies  (<250 copies / mL). A negative result must be combined with clinical  observations, patient history, and epidemiological information. If result is POSITIVE SARS-CoV-2 target nucleic acids are DETECTED. The SARS-CoV-2 RNA is generally detectable in upper and lower  respiratory specimens dur ing the acute phase of infection.  Positive  results are indicative of active infection with SARS-CoV-2.  Clinical  correlation with patient history and other diagnostic information is  necessary to determine patient infection status.  Positive results do  not rule out bacterial infection or co-infection with other viruses. If result is PRESUMPTIVE POSTIVE SARS-CoV-2 nucleic acids MAY BE PRESENT.   A  presumptive positive result was obtained on the submitted specimen  and confirmed on repeat testing.  While 2019 novel coronavirus  (SARS-CoV-2) nucleic acids may be present in the submitted sample  additional confirmatory testing may be necessary for epidemiological  and / or clinical management purposes  to differentiate between  SARS-CoV-2 and other Sarbecovirus currently known to infect humans.  If clinically indicated additional testing with an alternate test  methodology [phone]) is advised. The SARS-CoV-2 RNA is generally  detectable in upper and lower respiratory sp ecimens during the acute  phase of infection. The expected result is Negative. Fact  Sheet for Patients:  StrictlyIdeas.no Fact Sheet for Healthcare Providers: BankingDealers.co.za This test is not yet approved or cleared by the [place] FDA and has been authorized for detection and/or diagnosis of SARS-CoV-2 by FDA under an Emergency Use Authorization (EUA).  This EUA will remain in effect (meaning this test can be used) for the duration of the COVID-19 declaration under Section 564(b)(1) of the Act, 21 U.S.C. section 360bbb-3(b)(1), unless the authorization is terminated or revoked sooner. Performed at [facility], [place] [address], [place], [place] [address]          Radiology Studies: Dg Chest Port 1 View  Result Date: 01/07/2019 CLINICAL DATA:  Short of breath.  History of CHF. EXAM: PORTABLE CHEST 1 VIEW COMPARISON:  01/05/2019 and older exams. FINDINGS: Mild to moderate enlargement of the cardiopericardial silhouette. No mediastinal or hilar masses. No convincing adenopathy. Prominent bronchovascular markings with additional lung base reticular opacities chronic and similar to prior exams. No evidence of pneumonia or pulmonary edema. No pleural effusion or pneumothorax. Left anterior chest wall sequential pacemaker is stable. Skeletal structures are grossly intact. IMPRESSION: 1. No acute cardiopulmonary disease. 2. Stable mild-to-moderate cardiomegaly. Electronically Signed   By: Lajean Manes M.D.   On: 01/07/2019 08:27        Scheduled Meds: . amiodarone  400 mg Oral BID  . apixaban  2.5 mg Oral BID  . atorvastatin  40 mg Oral q1800  . carvedilol  3.125 mg Oral BID WC  . cholecalciferol  1,000 Units Oral QHS  . ferrous gluconate  324 mg Oral BID  . magnesium oxide  400 mg Oral Daily  . pantoprazole  40 mg Oral BID AC  . potassium chloride SA  20 mEq Oral Daily  . sodium chloride flush  3 mL Intravenous Q12H   Continuous Infusions: . sodium chloride       LOS: 2 days     Cordelia Poche, MD  [facility] 01/08/2019, 12:07 PM  If 7PM-7AM, please contact night-coverage [email or web address]

## 2019-01-09 ENCOUNTER — Encounter (HOSPITAL_COMMUNITY): Payer: Self-pay | Admitting: Physician Assistant

## 2019-01-09 DIAGNOSIS — I340 Nonrheumatic mitral (valve) insufficiency: Secondary | ICD-10-CM

## 2019-01-09 DIAGNOSIS — I471 Supraventricular tachycardia: Secondary | ICD-10-CM

## 2019-01-09 LAB — BASIC METABOLIC PANEL
Anion gap: 8 (ref 5–15)
BUN: 32 mg/dL — ABNORMAL HIGH (ref 8–23)
CO2: 23 mmol/L (ref 22–32)
Calcium: 8.9 mg/dL (ref 8.9–10.3)
Chloride: 106 mmol/L (ref 98–111)
Creatinine, Ser: 1.93 mg/dL — ABNORMAL HIGH (ref 0.44–1.00)
GFR calc Af Amer: 29 mL/min — ABNORMAL LOW (ref >60)
GFR calc non Af Amer: 25 mL/min — ABNORMAL LOW (ref >60)
Glucose, Bld: 86 mg/dL (ref 70–99)
Potassium: 4.7 mmol/L (ref 3.5–5.1)
Sodium: 137 mmol/L (ref 135–145)

## 2019-01-09 LAB — MAGNESIUM: Magnesium: 2.2 mg/dL (ref 1.7–2.4)

## 2019-01-09 MED ORDER — APIXABAN 5 MG PO TABS
5.0000 mg | ORAL_TABLET | Freq: Two times a day (BID) | ORAL | Status: DC
  Administered 2019-01-09 – 2019-01-12 (×6): 5 mg via ORAL
  Filled 2019-01-09 (×6): qty 1

## 2019-01-09 MED ORDER — ISOSORB DINITRATE-HYDRALAZINE 20-37.5 MG PO TABS
0.5000 | ORAL_TABLET | Freq: Three times a day (TID) | ORAL | Status: DC
  Administered 2019-01-10: 0.5 via ORAL
  Filled 2019-01-09 (×4): qty 1

## 2019-01-09 MED ORDER — IPRATROPIUM BROMIDE 0.02 % IN SOLN: 0.5000 mg | Freq: Three times a day (TID) | RESPIRATORY_TRACT | Status: DC

## 2019-01-09 MED ORDER — TRAZODONE HCL 50 MG PO TABS
25.0000 mg | ORAL_TABLET | Freq: Once | ORAL | Status: AC
  Administered 2019-01-09: 25 mg via ORAL
  Filled 2019-01-09: qty 1

## 2019-01-09 MED ORDER — FUROSEMIDE 10 MG/ML IJ SOLN
80.0000 mg | Freq: Once | INTRAMUSCULAR | Status: AC
  Administered 2019-01-09: 80 mg via INTRAVENOUS
  Filled 2019-01-09: qty 8

## 2019-01-09 MED ORDER — LEVALBUTEROL HCL 0.63 MG/3ML IN NEBU: 0.6300 mg | INHALATION_SOLUTION | Freq: Three times a day (TID) | RESPIRATORY_TRACT | Status: DC

## 2019-01-09 MED ORDER — LEVALBUTEROL TARTRATE 45 MCG/ACT IN AERO: 2.0000 | INHALATION_SPRAY | Freq: Three times a day (TID) | RESPIRATORY_TRACT | Status: DC

## 2019-01-09 MED ORDER — LEVALBUTEROL HCL 0.63 MG/3ML IN NEBU
0.6300 mg | INHALATION_SOLUTION | Freq: Three times a day (TID) | RESPIRATORY_TRACT | Status: DC
  Administered 2019-01-09: 0.63 mg via RESPIRATORY_TRACT
  Filled 2019-01-09: qty 3

## 2019-01-09 MED ORDER — AMIODARONE HCL 200 MG PO TABS
200.0000 mg | ORAL_TABLET | Freq: Two times a day (BID) | ORAL | Status: DC
  Administered 2019-01-09 – 2019-01-12 (×6): 200 mg via ORAL
  Filled 2019-01-09 (×6): qty 1

## 2019-01-09 MED ORDER — IPRATROPIUM BROMIDE 0.02 % IN SOLN
0.5000 mg | Freq: Three times a day (TID) | RESPIRATORY_TRACT | Status: DC
  Administered 2019-01-09: 0.5 mg via RESPIRATORY_TRACT
  Filled 2019-01-09: qty 2.5

## 2019-01-09 MED ORDER — DM-GUAIFENESIN ER 30-600 MG PO TB12
2.0000 | ORAL_TABLET | Freq: Two times a day (BID) | ORAL | Status: DC
  Administered 2019-01-09 – 2019-01-12 (×6): 2 via ORAL
  Filled 2019-01-09 (×3): qty 2
  Filled 2019-01-09: qty 1
  Filled 2019-01-09 (×3): qty 2

## 2019-01-09 MED ORDER — IPRATROPIUM BROMIDE HFA 17 MCG/ACT IN AERS: 2.0000 | INHALATION_SPRAY | Freq: Three times a day (TID) | RESPIRATORY_TRACT | Status: DC

## 2019-01-09 NOTE — Progress Notes (Signed)
Physical Therapy Treatment Patient Details Name: Stephanie Yates MRN: [id number] DOB: 21-Apr-1946 Today's Date: 01/09/2019    History of Present Illness 73 yo admitted with acute on chronic CHF. PMhx: HTN, HLD, CKD, Afib, NICM    PT Comments    Pt was seen for mobility and strengthening as tolerated, but declined OOB due to fatigue.  Her tolerance for bed ex's and bed mob was good however, giving her a better expectation for success in returning home.  Have plan to continue acute therapy with focus on gait and strengthening, and will continue to monitor her vitals as needed, observe pt for safety with all transitions.      Follow Up Recommendations  Home health PT     Equipment Recommendations  Rolling walker with 5" wheels    Recommendations for Other Services       Precautions / Restrictions Precautions Precautions: Fall Restrictions Weight Bearing Restrictions: No    Mobility  Bed Mobility Overal bed mobility: Modified Independent             General bed mobility comments: able to scoot up in bed with no help  Transfers                 General transfer comment: declined to do with PT  Ambulation/Gait                 Stairs             Wheelchair Mobility    Modified Rankin (Stroke Patients Only)       Balance     Sitting balance-Leahy Scale: Good                                      Cognition Arousal/Alertness: Awake/alert Behavior During Therapy: WFL for tasks assessed/performed Overall Cognitive Status: Within Functional Limits for tasks assessed                                        Exercises General Exercises - Lower Extremity Ankle Circles/Pumps: AROM;Both;5 reps Quad Sets: AROM;Both;10 reps Gluteal Sets: AROM;Both;10 reps Heel Slides: AROM;Both;10 reps Hip ABduction/ADduction: AROM;Both;10 reps Straight Leg Raises: AROM;Both;10 reps    General Comments        Pertinent  Vitals/Pain Pain Assessment: No/denies pain    Home Living                      Prior Function            PT Goals (current goals can now be found in the care plan section) Progress towards PT goals: Progressing toward goals    Frequency    Min 3X/week      PT Plan Current plan remains appropriate    Co-evaluation              AM-PAC PT "6 Clicks" Mobility   Outcome Measure  Help needed turning from your back to your side while in a flat bed without using bedrails?: None Help needed moving from lying on your back to sitting on the side of a flat bed without using bedrails?: None Help needed moving to and from a bed to a chair (including a wheelchair)?: None Help needed standing up from a chair using your arms (e.g., wheelchair or bedside chair)?: None Help  needed to walk in hospital room?: A Little Help needed climbing 3-5 steps with a railing? : A Little 6 Click Score: 22    End of Session   Activity Tolerance: Patient tolerated treatment well Patient left: in bed;with call bell/phone within reach;with bed alarm set Nurse Communication: Mobility status PT Visit Diagnosis: Other abnormalities of gait and mobility (R26.89);Muscle weakness (generalized) (M62.81)     Time: 5488-3014 PT Time Calculation (min) (ACUTE ONLY): 19 min  Charges:  $Therapeutic Exercise: 8-22 mins                    Ramond Dial 01/09/2019, 1:25 PM   Mee Hives, PT MS Acute Rehab Dept. Number: [place] [facility] [place]

## 2019-01-09 NOTE — Progress Notes (Signed)
Patient walked outside the room refused walker breathing rates was deep and heavy dropped to low 90s and back up with  rest to 97%

## 2019-01-09 NOTE — Care Management Important Message (Signed)
Important Message  Patient Details  Name: Stephanie Yates MRN: [id number] Date of Birth: 12/08/1945   Medicare Important Message Given:  Yes     Shelda Altes 01/09/2019, 12:42 PM

## 2019-01-09 NOTE — Progress Notes (Signed)
Patient has periods of SOB Medication are being adjusted along with mucinex and and xopenex

## 2019-01-09 NOTE — Consult Note (Addendum)
Cardiology Consultation:   Patient ID: ARIANNA HAYDON; [id number]; [birth year]   Admit date: 01/05/2019 Date of Consult: 01/09/2019  Primary Care Provider: Josetta Huddle, MD Primary Cardiologist: No primary care provider on file. (managed by Dr. Caryl Comes) Primary Electrophysiologist:  Virl Axe, MD  Chief Complaint: shortness of breath  Patient Profile:   CHAVY AVERA is a 73 y.o. female with a hx of chronic combined CHF/NICM with paroxysmal sustained ventricular tachycardia s/p SJM AICD 2014, PAF, bradycardia requiring adjustment in lower device rate, mitral regurgitation, HTN, HLD, CKD stage IV (baseline 1.7-2.1), chronic anemia, moderate protein-calorie malnutrition, GIB 05/2017 (ulcers, gastritis, gastric polyp), prior tobacco and prior remote ETOH abuse (quit 20 y/a) who is being seen today for the evaluation of CHF at the request of Dr. Nevada Crane.  History of Present Illness:   Ms. Coletti has a history of NICM and VT going back to 2014 at which time she underwent heart cath showing EF 10-15% and patent coronaries without CAD (luminal irregularities in LAD otherwise normal). She underwent AICD implantation for secondary prevention by Dr. Caryl Comes at that time and has been primary followed in the EP setting. She was also found to have PAF on device interrogation starting in 2015, prompting initiation of Eliquis. Last cardiology admission was in 05/2018 for [facility] felt AF + aberrancy versus VT. Amiodarone had previously fallen of her list for unclear reasons but this was resumed. She was last admitted 08/2018 in setting of flu A. She saw Chanetta Marshall NP 12/19/18 for episode of dizziness/ICD shock while snapping beans. Device interrogation showed that she had had several episodes of VT, longest of which lasted 83 minutes hovering around detection - the episode in June had failed ATP and resulted in appropriate shock. Amiodarone was increased to 227m BID and VT detection zone was lowered. AF burden was  low. The patient had another episode of VT->VF zone 12/20/18 with recommendation to continue higher dose of amiodarone. She had another episode 12/24/18 at which time amiodarone was increased with plan for 4049mBID x 2 weeks, then 20068mID x 2 weeks then 200m44mily. Last EF prior to this admission was estimated 25% in 05/2018. She has been on dose adjusted Eliquis at 2.5mg 47m although meets criteria for 5mg B74mdose. She had to previously stop aldactone due to CKD stage IV and is not on ACEI/ARB/ARNI for similar reason. 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 At baseline she does not do much activity, only ambulating between rooms in her home.  She was admitted 01/06/19 with worsening SOB, orthopnea and chest discomfort for about a week. She also had recalled feeling possible fever but has had normal temp here. She's been feeling weak as well. No change in appetite - doesn't eat much. No edema, significant weight gain or abdominal distention. Initial labs showed Cr 2.03 similar to prior, BNP 1129, hsTroponin negative x 2, mild anemia similar to prior (Hgb ~11), ESR/CRP wnl, AST 62, TSH 2.456 (of note, recent fT4 elevated with normal TSH), Covid neg. CXR showed mild CHF. She received Lasix (80mg x13men 40mg BI34mut this was discontinued after Cr went to 2.46. 2D echo 01/07/19 showed EF 15%, impaired diastolic dysfunction, moderate LAE, normal RV, degenerative MV with mild-moderate MR (functional MR with restriction of posterior leaflet), trivial pericardial effusion, PASP 39mmHg. 32mt wt 63.5kg which is down 2.6kg from 6/24 OV and similar to 01/2018.  She feels marginally better with -2.2L but still got winded/tired from just exercises in bed.  Past Medical History:  Diagnosis Date   Chronic anemia    Chronic systolic CHF (congestive heart failure) (HCC)    CKD (chronic kidney disease), stage IV (HCC)    Former tobacco use     History of tobacco abuse    Hyperlipidemia    Hypertension    Mitral regurgitation    Nonischemic cardiomyopathy ([place])    a. EF 10-15% on 11/2012 cath with no significant CAD. b. EF 25% in 2019. b. EF 15% in 12/2018   PAF (paroxysmal atrial fibrillation) (HCC)    Protein calorie malnutrition ([place])    Quit consuming alcohol in remote past    Sinus bradycardia 12/21/2012   Ventricular tachycardia ([place]) 12/14/2012    Past Surgical History:  Procedure Laterality Date   ESOPHAGOGASTRODUODENOSCOPY (EGD) WITH PROPOFOL N/A 06/18/2017   Procedure: ESOPHAGOGASTRODUODENOSCOPY (EGD) WITH PROPOFOL;  Surgeon: Ronnette Juniper, MD;  Location: [place];  Service: Gastroenterology;  Laterality: N/A;   IMPLANTABLE CARDIOVERTER DEFIBRILLATOR IMPLANT  12/19/2012   St. Jude dual-chamber ICD, serial number [id number]   IMPLANTABLE CARDIOVERTER DEFIBRILLATOR IMPLANT N/A 12/19/2012   Procedure: IMPLANTABLE CARDIOVERTER DEFIBRILLATOR IMPLANT;  Surgeon: Deboraha Sprang, MD;  Location[facility] C[facility] LAB;  Service: Cardiovascular;  Laterality: N/A;   LEFT AND RIGHT HEART CATHETERIZATION WITH CORONARY ANGIOGRAM N/A 12/17/2012   Procedure: LEFT AND RIGHT HEART CATHETERIZATION WITH CORONARY ANGIOGRAM;  Surgeon: Sherren Mocha, MD;  Location[facility] C[facility] LAB;  Service: Cardiovascular;  Laterality: N/A;     Inpatient Medications: Scheduled Meds:  amiodarone  400 mg Oral BID   apixaban  2.5 mg Oral BID   atorvastatin  40 mg Oral q1800   carvedilol  3.125 mg Oral BID WC   cholecalciferol  1,000 Units Oral QHS   ferrous gluconate  324 mg Oral BID   magnesium oxide  400 mg Oral Daily   pantoprazole  40 mg Oral BID AC   potassium chloride SA  20 mEq Oral Daily   sodium chloride flush  3 mL Intravenous Q12H   Continuous Infusions:  sodium chloride     PRN Meds: sodium chloride, acetaminophen, acetaminophen, alum & mag hydroxide-simeth, nitroGLYCERIN, ondansetron (ZOFRAN) IV, sodium chloride flush,  zolpidem  Home Meds: Prior to Admission medications   Medication Sig Start Date End Date Taking? Authorizing Provider  acetaminophen (TYLENOL) 500 MG tablet Take 1,000 mg by mouth every 6 (six) hours as needed for moderate pain.   Yes [provider]  amiodarone (PACERONE) 200 MG tablet Take 400 mg by mouth 2 (two) times daily.   Yes [provider]  atorvastatin (LIPITOR) 40 MG tablet Take 1 tablet (40 mg total) by mouth daily at 6 PM. 07/05/18  Yes Deboraha Sprang, MD  carvedilol (COREG) 3.125 MG tablet Take 1 tablet (3.125 mg total) by mouth 2 (two) times daily with a meal. 09/12/18  Yes Deboraha Sprang, MD  Cholecalciferol (VITAMIN D-3 PO) Take 1,000 Units by mouth at bedtime.    Yes [provider]  ELIQUIS 2.5 MG TABS tablet TAKE 1 TABLET BY MOUTH TWICE DAILY Patient taking differently: Take 2.5 mg by mouth 2 (two) times daily.  08/13/18  Yes Deboraha Sprang, MD  ferrous gluconate (FERGON) 324 MG tablet Take 1 tablet by mouth 2 (two) times daily. 07/05/18  Yes [provider]  furosemide (LASIX) 80 MG tablet Take 1 tablet (80 mg total) by mouth daily. 07/05/18  Yes  Deboraha Sprang, MD  magnesium oxide (MAG-OX) 400 (241.3 Mg) MG tablet Take 1 tablet (400 mg total) by mouth daily. 07/17/18  Yes Deboraha Sprang, MD  pantoprazole (PROTONIX) 40 MG tablet Take 1 tablet (40 mg total) by mouth 2 (two) times daily before a meal. 06/18/17  Yes Lavina Hamman, MD  potassium chloride SA (K-DUR,KLOR-CON) 20 MEQ tablet Take 1 tablet (20 mEq total) by mouth daily. 09/12/18  Yes Deboraha Sprang, MD  amiodarone (PACERONE) 200 MG tablet Take 2 tablets (400 mg total) by mouth 2 (two) times daily for 14 days, THEN 1 tablet (200 mg total) 2 (two) times daily for 14 days, THEN 1 tablet (200 mg total) daily. Patient not taking: Reported on 01/06/2019 12/27/18 02/23/19  Shirley Friar, PA-C  oseltamivir (TAMIFLU) 30 MG capsule Take 1 capsule (30 mg total) by mouth daily. Patient not  taking: Reported on 01/06/2019 09/06/18   Georgette Shell, MD  predniSONE (DELTASONE) 10 MG tablet Take 4 tablets for 2 days then 3 tablets for 2 days then 2 tablets for 2 days and then 1 tablet daily till done Patient not taking: Reported on 01/06/2019 09/06/18   Georgette Shell, MD    Allergies:    Allergies  Allergen Reactions   Strawberry Extract Hives, Itching, Swelling and Other (See Comments)    Hives and itching     Social History:   Social History   Socioeconomic History   Marital status: Widowed    Spouse name: Not on file   Number of children: Not on file   Years of education: Not on file   Highest education level: Not on file  Occupational History   Not on file  Social Needs   Financial resource strain: Not on file   Food insecurity    Worry: Not on file    Inability: Not on file   Transportation needs    Medical: Not on file    Non-medical: Not on file  Tobacco Use   Smoking status: Former Smoker   Smokeless tobacco: Never Used  Substance and Sexual Activity   Alcohol use: No   Drug use: No   Sexual activity: Not Currently  Lifestyle   Physical activity    Days per week: Not on file    Minutes per session: Not on file   Stress: Not on file  Relationships   Social connections    Talks on phone: Not on file    Gets together: Not on file    Attends religious service: Not on file    Active member of club or organization: Not on file    Attends meetings of clubs or organizations: Not on file    Relationship status: Not on file   Intimate partner violence    Fear of current or ex partner: Not on file    Emotionally abused: Not on file    Physically abused: Not on file    Forced sexual activity: Not on file  Other Topics Concern   Not on file  Social History Narrative   Not on file    Family History:   The patient's family history includes Cancer in her mother.  ROS:  Please see the history of present illness.  All  other ROS reviewed and negative.     Physical Exam/Data:   Vitals:   01/08/19 1707 01/08/19 1943 01/09/19 0350 01/09/19 0457  BP: 134/70 117/60  125/62  Pulse: 71 70  73  Resp:  18  18  Temp:  98.3 F (36.8 C)  97.9 F (36.6 C)  TempSrc:  Oral  Oral  SpO2: 98% 94%  93%  Weight:   63.7 kg   Height:        Intake/Output Summary (Last 24 hours) at 01/09/2019 1311 Last data filed at 01/09/2019 0546 Gross per 24 hour  Intake 723 ml  Output 300 ml  Net 423 ml   Last 3 Weights 01/09/2019 01/08/2019 01/07/2019  Weight (lbs) 140 lb 6.4 oz 140 lb 6.4 oz 139 lb 9.6 oz  Weight (kg) 63.685 kg 63.685 kg 63.322 kg    Body mass index is 22.66 kg/m.  General: Thin AAF in no acute distress. Softspoken. Head: Normocephalic, atraumatic, sclera non-icteric, no xanthomas, nares are without discharge.  Neck: Negative for carotid bruits. JVD not elevated. Lungs: Diffusely diminished without wheezes, rales, or rhonchi. Mild rales at bases. Breathing is unlabored lying in bed. Heart: RRR with S1 S2. No murmurs, rubs, or gallops appreciated. Abdomen: Soft, non-tender, non-distended with normoactive bowel sounds. No hepatomegaly. No rebound/guarding. No obvious abdominal masses. Msk:  Strength and tone appear normal for age. Extremities: No clubbing or cyanosis. No edema.  Distal pedal pulses are 2+ and equal bilaterally. Extremities are warm. Neuro: Alert and oriented X 3. No facial asymmetry. No focal deficit. Moves all extremities spontaneously. Psych:  Responds to questions appropriately with a normal affect.  EKG:  The EKG was personally reviewed and shows both atrial pacing as well as what appear to be some native sinus rhythm beats with prolonged AV conduction, LBBB-type IVCD, probable LAA, no acute ST changes, QTc 466m (comparable to 6/24)   Relevant CV Studies: 2D echo 01/07/19  1. The left ventricle has a visually estimated ejection fraction of 15%. The cavity size was mildly dilated. Left  ventricular diastolic Doppler parameters are consistent with impaired relaxation. Left ventricular diffuse hypokinesis. No LV thrombus  noted.  2. The right ventricle has normal systolic function. The cavity was normal. There is no increase in right ventricular wall thickness.  3. Left atrial size was moderately dilated.  4. The mitral valve is degenerative. Mild calcification of the mitral valve leaflet. There is moderate mitral annular calcification present. Mitral valve regurgitation is mild to moderate by color flow Doppler. Functional MR with restriction of  posterior leaflet. No evidence of mitral valve stenosis.  5. The aortic valve is tricuspid. Mild calcification of the aortic valve. No stenosis of the aortic valve.  6. Trivial pericardial effusion is present.  7. Normal IVC size. PA systolic pressure 39 mmHg.   Laboratory Data:  Chemistry Recent Labs  Lab 01/07/19 0337 01/08/19 0739 01/09/19 0330  NA 137 137 137  K 3.9 4.2 4.7  CL 103 106 106  CO2 24 23 23   GLUCOSE 95 86 86  BUN 28* 34* 32*  CREATININE 2.46* 2.17* 1.93*  CALCIUM 8.6* 8.9 8.9  GFRNONAA 19* 22* 25*  GFRAA 21* 25* 29*  ANIONGAP 10 8 8     Recent Labs  Lab 01/05/19 2213  PROT 7.5  ALBUMIN 3.1*  AST 62*  ALT 26  ALKPHOS 114  BILITOT 1.3*   Hematology Recent Labs  Lab 01/05/19 2213 01/06/19 0241  WBC 3.9* 5.5  RBC 4.63 4.66  HGB 10.8* 11.0*  HCT 34.2* 34.6*  MCV 73.9* 74.2*  MCH 23.3* 23.6*  MCHC 31.6 31.8  RDW 19.2* 19.3*  PLT 196 210   Cardiac EnzymesNo results for input(s): TROPONINI in the last 168  hours. No results for input(s): TROPIPOC in the last 168 hours.  BNP Recent Labs  Lab 01/05/19 2213  BNP 1,129.5*    DDimer No results for input(s): DDIMER in the last 168 hours.  Radiology/Studies:  Dg Chest Port 1 View  Result Date: 01/07/2019 CLINICAL DATA:  Short of breath.  History of CHF. EXAM: PORTABLE CHEST 1 VIEW COMPARISON:  01/05/2019 and older exams. FINDINGS: Mild to  moderate enlargement of the cardiopericardial silhouette. No mediastinal or hilar masses. No convincing adenopathy. Prominent bronchovascular markings with additional lung base reticular opacities chronic and similar to prior exams. No evidence of pneumonia or pulmonary edema. No pleural effusion or pneumothorax. Left anterior chest wall sequential pacemaker is stable. Skeletal structures are grossly intact. IMPRESSION: 1. No acute cardiopulmonary disease. 2. Stable mild-to-moderate cardiomegaly. Electronically Signed   By: Lajean Manes M.D.   On: 01/07/2019 08:27   Dg Chest Port 1 View  Result Date: 01/05/2019 CLINICAL DATA:  Shortness of breath and chest pain EXAM: PORTABLE CHEST 1 VIEW COMPARISON:  09/02/2018 FINDINGS: Cardiac shadow is enlarged. Defibrillator is again noted and stable. Aortic calcifications are seen. Vascular congestion is noted with interstitial edema. No sizable effusion is noted. IMPRESSION: Changes of mild CHF. Electronically Signed   By: Inez Catalina M.D.   On: 01/05/2019 22:06    Assessment and Plan:   1. Acute on chronic combined CHF - patient does not appear dramatically volume overloaded to explain degree of symptoms. Attempts were made at diuresis but with rise in her creatinine. I worry she may be manifesting low output. Question whether she would benefit from [facility] +/- attempt at inotropes. Medical regimen has been limited by her CKD, but may be room to try and optimize regimen further with agents that do not impact her renal function.  I will review next steps with Dr. Haroldine Laws.   2. Mild-moderate MR - noted on echocardiogram. Certainly not helping her prognosis.  3. AKI on CKD stage IV - Cr bumped with diuresis but now back near baseline with holding of Lasix.  4. Recent flurry of sustained VT requiring ICD shocks - per recent records, should now be on 2nd stepdown of amio dose (240m BID) but on 4031mBID here. Still with intermittent NSVT on telemetry. K OK, no  recent Mg level. Will order. May need EP input to determine whether alternative such as mexiletine is necessary. She is on very low dose BB which I presume has not been raised due to h/o bradycardia requiring adjustment of her PPM. She was on 12.74m41mID remotely but appears to have been changed to 3.1274m63mD in 05/2018 at which time lower pacing rate was also changed to 70bpm.  5. Paroxysmal atrial fibrillation - appears NSR on EKG. She remains on Eliquis 2.74mg 64m although technically would qualify for 74mg B62mdose. Per preliminary d/w Dr. BensimHaroldine Lawsease to 74mg BI61m 6. Chronic anemia - appears stable for patient.  7. Recently abnormal thyroid function - TSH wnl but FT4 elevated recently. Will recheck in AM with free T4 and total T3. If free hormones remain elevated may need to think about a subclinical hyperthyroidism and potential AE of amiodarone.  For questions or updates, please contact [facility] consult [email or web address] for contact info under Cardiology/STEMI.    Signed, Dayna NCharlie Pitter 01/09/2019 1:11 PM  Patient seen and examined with the above-signed Advanced Practice Provider and/or Housestaff. I personally reviewed laboratory data, imaging studies and relevant notes. I independently examined  the patient and formulated the important aspects of the plan. I have edited the note to reflect any of my changes or salient points. I have personally discussed the plan with the patient and/or family.  Delightful 73 y/o woman with h/o CHF due to severe NICM (EF 20%), recurrent VT, CKD 3-4, PAF and debility. Has been followed closely by Dr. Caryl Comes. 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 Has not worked since that time due to [employer]. Lives alone but has daughter help care for her.   Over past few month has had several episodes of VT with ICD shocks. Admitted to the hospital with increasing weakness and debility. Now struggles with ADLs.  Echo  shows EF 20% with normal RV. Moderate MR. Personally reviewed  On exam Elderly frail SOB at rest CVP 9 Cor RRR + s3 Lungs minimal bibasilar crackles Ab soft NT ND Ex warm no c/c/e  I suspect she has end-stage low-output HF complicated by A/CKD and significant debility. Volume status likely mildly elevated but not severely so. Unfortunately given debility and renal failure, I do not think she is a candidate for advanced therapies. (I.ee VAD). Also with recurrent VT I would not consider IV inotropes. We talked extensively about her situation and she said clearly that she would not want Hospice at this point but also would never want to be on a ventilator (CPR ok).   I will try to diurese her some more and optimize medical therapy with addition of low-dose Bidil as tolerated to improve her symptoms. Will change code status to DNI. She has agreed to Palliative care visit to include her daughter and address [place].   Will drop amio to 200 bid and increase Eliquis to 5 bid  Glori Bickers, MD  5:26 PM

## 2019-01-09 NOTE — Progress Notes (Signed)
PROGRESS NOTE  Stephanie Yates NOM:[id number] DOB: 04-03-1946 DOA: 01/05/2019 PCP: Josetta Huddle, MD  HPI/Recap of past 24 hours: Stephanie Yates is a 73 y.o. femalewith medical history significant ofatrial fibrillation, systolic dysfunction CHF with EF of 25%, tobacco abuse,CKD III,hyperlipidemia and hypertension. She presented secondary to worsening dyspnea and chest discomfort concerning for acute on chronic heart failure. She has been started on IV diuresis.  01/09/19: Patient was seen and examined at her bedside.  She states she feels poorly.  She has conversational dyspnea and states she has a hard time catching her breath.  Also reports persistent nonproductive cough.  She appears euvolemic.  She denies chest pain.  Assessment/Plan: Principal Problem:   Acute on chronic combined systolic and diastolic CHF (congestive heart failure) (HCC) Active Problems:   Hypertension   Nonischemic cardiomyopathy (HCC)   Hypokalemia   History of tobacco abuse   CKD (chronic kidney disease), stage III (HCC)   Chest pain   Rapid atrial fibrillation (HCC)  Acute on chronic combined systolic and diastolic CHF Previous 2D echo done on 06/15/2018 revealed LVEF 20-25% Repeated 2D echo done during this admission showed LVEF 15% Patient has been appropriately diuresed and is currently euvolemic however her symptoms are persistent dyspnea at rest with symptoms of orthopnea. Independently reviewed last chest x-ray done on 01/07/2019 which showed cardiomegaly with mild increase in pulmonary vascularity suggestive of pulmonary edema.  This chest x-ray was improved from prior done on 01/05/2019. Net I&O -2.2 L  since admission Continue to monitor strict I's and O's and daily weight Her cardiac medications include amiodarone 400 mg twice daily, Lipitor 40 mg daily, Coreg 3.125 mg twice daily, Eliquis 2.5 mg twice daily We will consult cardiology  Persistent conversational dyspnea Possibly related to acute  on chronic systolic heart failure Received diuretics No signs of lobular infiltrates on chest x-ray done on 01/07/2019 Will obtain procalcitonin to rule out infective process  AKI on CKD 3 Patient presented with creatinine of 2.46 with GFR of 21 Creatinine this morning 1.93 from 2.17 yesterday Baseline creatinine appears to be 1.7 with GFR of 33 Continue to avoid nephrotoxins Continue to monitor urine output Repeat BMP in the morning  Paroxysmal A. fib Independently reviewed twelve-lead EKG done on 01/05/2019 which showed sinus rhythm with no specific ST-T changes Currently on amiodarone for rhythm control Coreg for rate control Eliquis for CVA prevention  Generalized physical debility/ PT to assess Fall precautions  Chronic microcytic anemia Last hemoglobin stable at 11.0, MCV 74 on 01/06/2019 No sign of overt bleeding  Hypertension Blood pressure is at goal Continue Coreg  DVT prophylaxis: Eliquis Code Status:   Code Status: Full Code Family Communication: None Disposition Plan: Discharge likely home in 24 hours pending improvement of breathing in addition to kidney function   Objective: Vitals:   01/08/19 1707 01/08/19 1943 01/09/19 0350 01/09/19 0457  BP: 134/70 117/60  125/62  Pulse: 71 70  73  Resp:  18  18  Temp:  98.3 F (36.8 C)  97.9 F (36.6 C)  TempSrc:  Oral  Oral  SpO2: 98% 94%  93%  Weight:   63.7 kg   Height:        Intake/Output Summary (Last 24 hours) at 01/09/2019 0759 Last data filed at 01/09/2019 0546 Gross per 24 hour  Intake 843 ml  Output 300 ml  Net 543 ml   Filed Weights   01/07/19 0528 01/08/19 0444 01/09/19 0350  Weight: 63.3 kg 63.7 kg  63.7 kg    Exam:  . General: 73 y.o. year-old female well developed well nourished in no acute distress.  Alert and oriented x3.  Conversational dyspnea noted.  Also persistent cough noted at bedside. . Cardiovascular: Regular rate and rhythm with no rubs or gallops.  No thyromegaly or JVD  noted.   Marland Kitchen Respiratory: Mild rales at bases with no wheezes noted.  Poor inspiratory effort. . Abdomen: Soft nontender nondistended with normal bowel sounds x4 quadrants. . Musculoskeletal: No lower extremity edema. 2/4 pulses in all 4 extremities. Marland Kitchen Psychiatry: Mood is appropriate for condition and setting   Data Reviewed: CBC: Recent Labs  Lab 01/05/19 2213 01/06/19 0241  WBC 3.9* 5.5  NEUTROABS 1.8 2.3  HGB 10.8* 11.0*  HCT 34.2* 34.6*  MCV 73.9* 74.2*  PLT 196 810   Basic Metabolic Panel: Recent Labs  Lab 01/05/19 2213 01/07/19 0337 01/08/19 0739 01/09/19 0330  NA 135 137 137 137  K 4.1 3.9 4.2 4.7  CL 105 103 106 106  CO2 22 24 23 23   GLUCOSE 126* 95 86 86  BUN 24* 28* 34* 32*  CREATININE 2.03* 2.46* 2.17* 1.93*  CALCIUM 8.9 8.6* 8.9 8.9   GFR: Estimated Creatinine Clearance: 23.6 mL/min (A) (by C-G formula based on SCr of 1.93 mg/dL (H)). Liver Function Tests: Recent Labs  Lab 01/05/19 2213  AST 62*  ALT 26  ALKPHOS 114  BILITOT 1.3*  PROT 7.5  ALBUMIN 3.1*   No results for input(s): LIPASE, AMYLASE in the last 168 hours. No results for input(s): AMMONIA in the last 168 hours. Coagulation Profile: No results for input(s): INR, PROTIME in the last 168 hours. Cardiac Enzymes: No results for input(s): CKTOTAL, CKMB, CKMBINDEX, TROPONINI in the last 168 hours. BNP (last 3 results) No results for input(s): PROBNP in the last 8760 hours. HbA1C: No results for input(s): HGBA1C in the last 72 hours. CBG: No results for input(s): GLUCAP in the last 168 hours. Lipid Profile: No results for input(s): CHOL, HDL, LDLCALC, TRIG, CHOLHDL, LDLDIRECT in the last 72 hours. Thyroid Function Tests: No results for input(s): TSH, T4TOTAL, FREET4, T3FREE, THYROIDAB in the last 72 hours. Anemia Panel: No results for input(s): VITAMINB12, FOLATE, FERRITIN, TIBC, IRON, RETICCTPCT in the last 72 hours. Urine analysis:    Component Value Date/Time   COLORURINE YELLOW  08/29/2014 2035   APPEARANCEUR CLOUDY (A) 08/29/2014 2035   LABSPEC 1.017 08/29/2014 2035   PHURINE 6.0 08/29/2014 2035   GLUCOSEU NEGATIVE 08/29/2014 2035   HGBUR SMALL (A) 08/29/2014 2035   BILIRUBINUR NEGATIVE 08/29/2014 2035   KETONESUR NEGATIVE 08/29/2014 2035   PROTEINUR NEGATIVE 08/29/2014 2035   UROBILINOGEN 1.0 08/29/2014 2035   NITRITE NEGATIVE 08/29/2014 2035   LEUKOCYTESUR TRACE (A) 08/29/2014 2035   Sepsis Labs: @LABRCNTIP (procalcitonin:4,lacticidven:4)  ) Recent Results (from the past 240 hour(s))  SARS Coronavirus 2 (CEPHEID - Performed in [place] hospital lab), Hosp Order     Status: None   Collection Time: 01/05/19 10:31 PM   Specimen: Nasopharyngeal Swab  Result Value Ref Range Status   SARS Coronavirus 2 NEGATIVE NEGATIVE Final    Comment: (NOTE) If result is NEGATIVE SARS-CoV-2 target nucleic acids are NOT DETECTED. The SARS-CoV-2 RNA is generally detectable in upper and lower  respiratory specimens during the acute phase of infection. The lowest  concentration of SARS-CoV-2 viral copies this assay can detect is 250  copies / mL. A negative result does not preclude SARS-CoV-2 infection  and should not be used  as the sole basis for treatment or other  patient management decisions.  A negative result may occur with  improper specimen collection / handling, submission of specimen other  than nasopharyngeal swab, presence of viral mutation(s) within the  areas targeted by this assay, and inadequate number of viral copies  (<250 copies / mL). A negative result must be combined with clinical  observations, patient history, and epidemiological information. If result is POSITIVE SARS-CoV-2 target nucleic acids are DETECTED. The SARS-CoV-2 RNA is generally detectable in upper and lower  respiratory specimens dur ing the acute phase of infection.  Positive  results are indicative of active infection with SARS-CoV-2.  Clinical  correlation with patient history  and other diagnostic information is  necessary to determine patient infection status.  Positive results do  not rule out bacterial infection or co-infection with other viruses. If result is PRESUMPTIVE POSTIVE SARS-CoV-2 nucleic acids MAY BE PRESENT.   A presumptive positive result was obtained on the submitted specimen  and confirmed on repeat testing.  While 2019 novel coronavirus  (SARS-CoV-2) nucleic acids may be present in the submitted sample  additional confirmatory testing may be necessary for epidemiological  and / or clinical management purposes  to differentiate between  SARS-CoV-2 and other Sarbecovirus currently known to infect humans.  If clinically indicated additional testing with an alternate test  methodology [phone]) is advised. The SARS-CoV-2 RNA is generally  detectable in upper and lower respiratory sp ecimens during the acute  phase of infection. The expected result is Negative. Fact Sheet for Patients:  StrictlyIdeas.no Fact Sheet for Healthcare Providers: BankingDealers.co.za This test is not yet approved or cleared by the [place] FDA and has been authorized for detection and/or diagnosis of SARS-CoV-2 by FDA under an Emergency Use Authorization (EUA).  This EUA will remain in effect (meaning this test can be used) for the duration of the COVID-19 declaration under Section 564(b)(1) of the Act, 21 U.S.C. section 360bbb-3(b)(1), unless the authorization is terminated or revoked sooner. Performed at [facility], [place] [address], [place], [place] [address]       Studies: No results found.  Scheduled Meds: . amiodarone  400 mg Oral BID  . apixaban  2.5 mg Oral BID  . atorvastatin  40 mg Oral q1800  . carvedilol  3.125 mg Oral BID WC  . cholecalciferol  1,000 Units Oral QHS  . ferrous gluconate  324 mg Oral BID  . magnesium oxide  400 mg Oral Daily  . pantoprazole  40 mg Oral BID AC  .  potassium chloride SA  20 mEq Oral Daily  . sodium chloride flush  3 mL Intravenous Q12H    Continuous Infusions: . sodium chloride       LOS: 3 days     Kayleen Memos, MD [facility] Pager [phone]  If 7PM-7AM, please contact night-coverage [email or web address] Password [facility] 01/09/2019, 7:59 AM

## 2019-01-10 DIAGNOSIS — Z7189 Other specified counseling: Secondary | ICD-10-CM

## 2019-01-10 DIAGNOSIS — Z515 Encounter for palliative care: Secondary | ICD-10-CM

## 2019-01-10 LAB — CBC WITH DIFFERENTIAL/PLATELET
Abs Immature Granulocytes: 0.02 10*3/uL (ref 0.00–0.07)
Basophils Absolute: 0.1 10*3/uL (ref 0.0–0.1)
Basophils Relative: 2 %
Eosinophils Absolute: 0.2 10*3/uL (ref 0.0–0.5)
Eosinophils Relative: 4 %
HCT: 36.6 % (ref 36.0–46.0)
Hemoglobin: 11.6 g/dL — ABNORMAL LOW (ref 12.0–15.0)
Immature Granulocytes: 0 %
Lymphocytes Relative: 33 %
Lymphs Abs: 1.9 10*3/uL (ref 0.7–4.0)
MCH: 23.5 pg — ABNORMAL LOW (ref 26.0–34.0)
MCHC: 31.7 g/dL (ref 30.0–36.0)
MCV: 74.1 fL — ABNORMAL LOW (ref 80.0–100.0)
Monocytes Absolute: 0.9 10*3/uL (ref 0.1–1.0)
Monocytes Relative: 16 %
Neutro Abs: 2.6 10*3/uL (ref 1.7–7.7)
Neutrophils Relative %: 45 %
Platelets: 215 10*3/uL (ref 150–400)
RBC: 4.94 MIL/uL (ref 3.87–5.11)
RDW: 20 % — ABNORMAL HIGH (ref 11.5–15.5)
WBC: 5.8 10*3/uL (ref 4.0–10.5)
nRBC: 0 % (ref 0.0–0.2)

## 2019-01-10 LAB — BASIC METABOLIC PANEL
Anion gap: 7 (ref 5–15)
BUN: 26 mg/dL — ABNORMAL HIGH (ref 8–23)
CO2: 24 mmol/L (ref 22–32)
Calcium: 9.1 mg/dL (ref 8.9–10.3)
Chloride: 105 mmol/L (ref 98–111)
Creatinine, Ser: 1.93 mg/dL — ABNORMAL HIGH (ref 0.44–1.00)
GFR calc Af Amer: 29 mL/min — ABNORMAL LOW (ref >60)
GFR calc non Af Amer: 25 mL/min — ABNORMAL LOW (ref >60)
Glucose, Bld: 88 mg/dL (ref 70–99)
Potassium: 4.4 mmol/L (ref 3.5–5.1)
Sodium: 136 mmol/L (ref 135–145)

## 2019-01-10 LAB — T4, FREE: Free T4: 1.86 ng/dL — ABNORMAL HIGH (ref 0.61–1.12)

## 2019-01-10 LAB — BRAIN NATRIURETIC PEPTIDE: B Natriuretic Peptide: 1735.2 pg/mL — ABNORMAL HIGH (ref 0.0–100.0)

## 2019-01-10 LAB — PROCALCITONIN: Procalcitonin: <0.1 ng/mL

## 2019-01-10 MED ORDER — FUROSEMIDE 80 MG PO TABS
80.0000 mg | ORAL_TABLET | Freq: Every day | ORAL | Status: DC
  Administered 2019-01-10 – 2019-01-12 (×3): 80 mg via ORAL
  Filled 2019-01-10 (×3): qty 1

## 2019-01-10 MED ORDER — IPRATROPIUM BROMIDE 0.02 % IN SOLN
0.5000 mg | Freq: Two times a day (BID) | RESPIRATORY_TRACT | Status: DC
  Administered 2019-01-10 – 2019-01-11 (×4): 0.5 mg via RESPIRATORY_TRACT
  Filled 2019-01-10 (×4): qty 2.5

## 2019-01-10 MED ORDER — LEVALBUTEROL HCL 0.63 MG/3ML IN NEBU
0.6300 mg | INHALATION_SOLUTION | Freq: Two times a day (BID) | RESPIRATORY_TRACT | Status: DC
  Administered 2019-01-10 – 2019-01-11 (×4): 0.63 mg via RESPIRATORY_TRACT
  Filled 2019-01-10 (×4): qty 3

## 2019-01-10 NOTE — Discharge Instructions (Signed)

## 2019-01-10 NOTE — Progress Notes (Addendum)
Patient received new medication BiDil. After felt "bad"   orthostatic pressures where performed. patient stated that she is feeling better after lying down  MD was made aware and the plan is for patient has palliative/[facility] follow up with telephone visits when medically stable.

## 2019-01-10 NOTE — Hospital Discharge Follow-Up (Signed)
Per Dr Haroldine Laws pt needs [facility] vest protocol post d/c and possible HH if she is agreeable.  Called referral into Joen Laura, RN w[facility] and gave orders.  Post hosp appt with our office scheduled for 7/30 at 11 am with Darrick Grinder, NP.

## 2019-01-10 NOTE — Progress Notes (Signed)
Advanced Heart Failure Rounding Note   Subjective:    Received IV lasix last night with good urine output. Breathing much better. No orthopnea or PND. Renal function stable.   ReDS measurement of lung water 29% (normal range 25-35%). Bidil started this am     Objective:   Weight Range:  Vital Signs:   Temp:  [97.6 F (36.4 C)-98.2 F (36.8 C)] 97.7 F (36.5 C) (07/16 0412) Pulse Rate:  [70-72] 70 (07/16 0814) Resp:  [2-20] 18 (07/16 0814) BP: (113-140)/(67-117) 113/69 (07/16 0412) SpO2:  [91 %-95 %] 92 % (07/16 0814) Weight:  [63.5 kg] 63.5 kg (07/16 0002) Last BM Date: 01/08/19  Weight change: Filed Weights   01/08/19 0444 01/09/19 0350 01/10/19 0002  Weight: 63.7 kg 63.7 kg 63.5 kg    Intake/Output:   Intake/Output Summary (Last 24 hours) at 01/10/2019 1017 Last data filed at 01/10/2019 0803 Gross per 24 hour  Intake 1013 ml  Output 1700 ml  Net -687 ml     Physical Exam: General:  Weak appearing. No resp difficulty HEENT: normal Neck: supple. JVP 5-6. Carotids 2+ bilat; no bruits. No lymphadenopathy or thryomegaly appreciated. Cor: PMI nondisplaced. Regular rate & rhythm. No rubs, gallops or murmurs. Lungs: clear Abdomen: soft, nontender, nondistended. No hepatosplenomegaly. No bruits or masses. Good bowel sounds. Extremities: no cyanosis, clubbing, rash, edema Neuro: alert & orientedx3, cranial nerves grossly intact. moves all 4 extremities w/o difficulty. Affect pleasant  Telemetry: NSR 70 Personally reviewed   Labs: Basic Metabolic Panel: Recent Labs  Lab 01/05/19 2213 01/07/19 0337 01/08/19 0739 01/09/19 0330 01/10/19 0422  NA 135 137 137 137 136  K 4.1 3.9 4.2 4.7 4.4  CL 105 103 106 106 105  CO2 22 24 23 23 24   GLUCOSE 126* 95 86 86 88  BUN 24* 28* 34* 32* 26*  CREATININE 2.03* 2.46* 2.17* 1.93* 1.93*  CALCIUM 8.9 8.6* 8.9 8.9 9.1  MG  --   --   --  2.2  --     Liver Function Tests: Recent Labs  Lab 01/05/19 2213  AST 62*   ALT 26  ALKPHOS 114  BILITOT 1.3*  PROT 7.5  ALBUMIN 3.1*   No results for input(s): LIPASE, AMYLASE in the last 168 hours. No results for input(s): AMMONIA in the last 168 hours.  CBC: Recent Labs  Lab 01/05/19 2213 01/06/19 0241 01/10/19 0422  WBC 3.9* 5.5 5.8  NEUTROABS 1.8 2.3 2.6  HGB 10.8* 11.0* 11.6*  HCT 34.2* 34.6* 36.6  MCV 73.9* 74.2* 74.1*  PLT 196 210 215    Cardiac Enzymes: No results for input(s): CKTOTAL, CKMB, CKMBINDEX, TROPONINI in the last 168 hours.  BNP: BNP (last 3 results) Recent Labs    09/02/18 1541 01/05/19 2213 01/10/19 0422  BNP 819.2* 1,129.5* 1,735.2*    ProBNP (last 3 results) No results for input(s): PROBNP in the last 8760 hours.    Other results:  Imaging:  No results found.   Medications:     Scheduled Medications: . amiodarone  200 mg Oral BID  . apixaban  5 mg Oral BID  . atorvastatin  40 mg Oral q1800  . carvedilol  3.125 mg Oral BID WC  . cholecalciferol  1,000 Units Oral QHS  . dextromethorphan-guaiFENesin  2 tablet Oral BID  . ferrous gluconate  324 mg Oral BID  . furosemide  80 mg Oral Daily  . ipratropium  0.5 mg Nebulization BID  . isosorbide-hydrALAZINE  0.5 tablet Oral  TID  . levalbuterol  0.63 mg Nebulization BID  . magnesium oxide  400 mg Oral Daily  . pantoprazole  40 mg Oral BID AC  . sodium chloride flush  3 mL Intravenous Q12H     Infusions: . sodium chloride       PRN Medications:  sodium chloride, acetaminophen, acetaminophen, alum & mag hydroxide-simeth, nitroGLYCERIN, ondansetron (ZOFRAN) IV, sodium chloride flush, zolpidem   Assessment/Plan   1. Acute on chronic combined CHF -  - symptomatically much improved with IV lasix overnight. Now euvloemic on exam and by ReDS device (29%) - we are starting Bidil 1/2 tablet tid today - from our stand point can go home today or tomorrow - [facility] with REDS monitoring.  - Continue lasix 80 po daily at  home. Add metolazone 2.5 mg with K 20 me prn for weight 145 or greater - Home HF meds     -lasix 80 daily    -prn metolazone as above    -bidil 0.5 tab tid    -carvedilol 3.125 bid    -amio 200 bid    -Apixaban 5 bid  Will arrange f/u in HF Clinic  Unfortunately given debility and renal failure, I do not think she is a candidate for advanced therapies. (I.e VAD). Also with recurrent VT I would not consider IV inotropes. We talked extensively about her situation and she said clearly that she would not want Hospice at this point but also would never want to be on a ventilator (CPR ok). She has agreed to Palliative care visit to include her daughter and address [place].   2. Mild-moderate MR - noted on echocardiogram.No severe enough to consider mitraClip   3. AKI on CKD stage IV - Cr bumped with diuresis but now back near baseline   4. VT - quiescent. Drop amio to 200 bid  5. Paroxysmal atrial fibrillation - appears NSR on EKG. - increase eliuis to 5 bid unles weight < 60 kg    Glori Bickers MD 01/10/2019, 10:17 AM  Advanced Heart Failure Team Pager [phone] (M-F; 7a - 4p)  Please contact [facility] for night-coverage after hours (4p -7a ) and weekends on [email or web address]

## 2019-01-10 NOTE — Progress Notes (Addendum)
Occupational Therapy Treatment Patient Details Name: Stephanie Yates MRN: [id number] DOB: 1946-01-11 Today's Date: 01/10/2019    History of present illness 73 yo admitted with acute on chronic CHF. PMhx: HTN, HLD, CKD, Afib, NICM   OT comments  Pt progressing towards OT goals; performing bathing and dressing ADL both in sitting/standing at sink this session with minguard assist for LB tasks. Pt demonstrating improved activity tolerance with activity today. Educated in general activity progression and activity tolerance during performance of functional tasks after return home. O2 sats maintaining at 89% and greater with activity. Recommend continue per POC.   Follow Up Recommendations  Home health OT;Supervision - Intermittent    Equipment Recommendations  3 in 1 bedside commode          Precautions / Restrictions Precautions Precautions: Fall Restrictions Weight Bearing Restrictions: No       Mobility Bed Mobility Overal bed mobility: Modified Independent             General bed mobility comments: able to scoot up in bed with no help  Transfers Overall transfer level: Needs assistance Equipment used: None Transfers: Sit to/from Stand Sit to Stand: Min guard;Supervision         General transfer comment: for safety, balance    Balance Overall balance assessment: Needs assistance   Sitting balance-Leahy Scale: Good     Standing balance support: No upper extremity supported;During functional activity Standing balance-Leahy Scale: Fair                             ADL either performed or assessed with clinical judgement   ADL Overall ADL's : Needs assistance/impaired         Upper Body Bathing: Set up;Sitting Upper Body Bathing Details (indicate cue type and reason): washing seated at sink  Lower Body Bathing: Supervison/ safety;Sit to/from stand Lower Body Bathing Details (indicate cue type and reason): supervision for safety; pt performing  in sitting and standing PRN Upper Body Dressing : Set up;Sitting Upper Body Dressing Details (indicate cue type and reason): donning new gown Lower Body Dressing: Min guard;Sit to/from stand Lower Body Dressing Details (indicate cue type and reason): doffing/donning new mesh underwear Toilet Transfer: Min guard;Ambulation;BSC Toilet Transfer Details (indicate cue type and reason): simulated via transfer to [facility], BSC placed in front of sink for bathing         Functional mobility during ADLs: Min guard General ADL Comments: pt demonstrating improvements in activity tolerance this session     Vision       Perception     Praxis      Cognition Arousal/Alertness: Awake/alert Behavior During Therapy: WFL for tasks assessed/performed Overall Cognitive Status: Within Functional Limits for tasks assessed                                          Exercises     Shoulder Instructions       General Comments      Pertinent Vitals/ Pain       Pain Assessment: No/denies pain  Home Living                                          Prior Functioning/Environment  Frequency  Min 2X/week        Progress Toward Goals  OT Goals(current goals can now be found in the care plan section)  Progress towards OT goals: Progressing toward goals  Acute Rehab OT Goals Patient Stated Goal: return home to cook and hopefully to work OT Goal Formulation: With patient Time For Goal Achievement: 01/21/19 Potential to Achieve Goals: Good  Plan Discharge plan remains appropriate;Frequency remains appropriate    Co-evaluation                 AM-PAC OT "6 Clicks" Daily Activity     Outcome Measure   Help from another person eating meals?: None Help from another person taking care of personal grooming?: None Help from another person toileting, which includes using toliet, bedpan, or urinal?: None Help from another person bathing  (including washing, rinsing, drying)?: A Little Help from another person to put on and taking off regular upper body clothing?: None Help from another person to put on and taking off regular lower body clothing?: A Little 6 Click Score: 22    End of Session    OT Visit Diagnosis: Unsteadiness on feet (R26.81);Muscle weakness (generalized) (M62.81)   Activity Tolerance Patient tolerated treatment well   Patient Left in chair;with call bell/phone within reach   Nurse Communication Mobility status        Time: 5176-1607 OT Time Calculation (min): 20 min  Charges: OT General Charges $OT Visit: 1 Visit OT Treatments $Self Care/Home Management : 8-22 mins  Lou Cal, OT Supplemental Rehabilitation Services Pager [phone] Office [phone]    Raymondo Band 01/10/2019, 12:50 PM

## 2019-01-10 NOTE — Consult Note (Addendum)
Consultation Note Date: 01/10/2019   Patient Name: Stephanie Yates  DOB: 08/25/1945  MRN: [id number]  Age / Sex: 73 y.o., female  PCP: Josetta Huddle, MD Referring Physician: Kayleen Memos, DO  Reason for Consultation: Establishing goals of care  HPI/Patient Profile: 73 y.o. female  with past medical history of CKD IV, combined NICM and CHF (EF 10-15%), mitral regurg, Pafib, and SVT. She has a [place] in place.  She was admitted on 01/05/2019 with chest pain and shortness of breath.  She has been diuresed and reports feeling improved.  Unfortunately with aggressive diuresis came decreased kidney function.  She has been closely followed by Cardiology.  She is not a candidate for advanced therapies such as LVAD or a PICC line with inotropes at home. PMT was asked to see for [place] conversation with her family.   Clinical Assessment and Goals of Care:  I have reviewed medical records including EPIC notes, labs and imaging, spoke with her daughter Stephanie Yates at length and then assessed the patient.  Stephanie Yates declined the opportunity to visit her mother in the hospital.  She does not want to have a goals of care conversation together with her mother.  I introduced Palliative Medicine as specialized medical care for people living with serious illness. It focuses on providing relief from the symptoms and stress of a serious illness. The goal is to improve quality of life for both the patient and the family.  Stephanie Yates and I discussed a brief life review of the patient. 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  She strongly wants to return to work.  The patient tells me that she worked in [employer] as well as other areas of the school.   Mrs. Billig had 5 children.  Of those two daughters remain.  She lost 1 son in August of 2018 and another son in August of 2019.  She lost a daughter 8 years ago.  Stephanie Yates  tells me her mother is sick because of a broken heart.  Stephanie Yates described her mother as fiercely independent. She keeps a spotless house, and is on the go despite her breathing issues.  She moves furniture around in her home and will not hesitate to get on the bus and go to [place].   She insists on living alone and will NOT have anyone else live with her.  Her grandson lives next door and Stephanie Yates lives nearby.  They are at her house multiple times every day.  Stephanie Yates states that we can not tell her mother that she will be unable to work because she will wither away.    Stephanie Yates and I talked about her mother's advanced cardiac disease and advance kidney disease.  Stephanie Yates was unaware that it was so far progressed.  I explained that we can not "Fix" anything.  At this point the best we can do is try to adjust medications to improve her symptoms.  I explained to [place] that medically we are nearing the end of what  we can improve.  Her mother is getting close to end of life.  I asked Stephanie Yates about [place].  Stephanie Yates is her mother's designated medical surrogate if needed, but Stephanie Yates states "I hope I never have to make any decisions".  We talked about life support.  Stephanie Yates felt that her mother would not want to be on life support.  She then warned me that her mother would not want to talk with me about life and death.  She felt if I pushed the discussion her mother would shut me out.  I sat and spoke with Stephanie Yates.  She corroborated everything Stephanie Yates told me.  She does want to live in her own home no matter what.  She never wants to be in a rehab facility.  Her philosophy is not consistent with engaging advanced medical interventions that require frequent or long hospital stays and rehab or LTAC.    I attempted to talk with her about completing a Living Will in order to benefit her daughter Stephanie Yates.  The patient very nicely told me she is not ready to do that right now.  We talked for a minute about code status - she  would like for resuscitation to be attempted if she arrests.  Unfortunately I don't believe Ms. Gallicchio understands the implications of that decision as it does not seem consistent with decreased medical interventions and staying in her own home.  We ended our conversation on a pleasant note and I called her daughter to update her.  I encouraged Stephanie Yates to have conversations with her mother about what she will want as she nears end of life.  Stephanie Yates agreed to try.  Primary Decision Maker:  PATIENT  Her health care decision making surrogate is her daughter Stephanie Yates.    SUMMARY OF RECOMMENDATIONS    Discharge to home with Palliative to follow for support. Thank you for involving PMT with this very nice family.  Please re-consult [place] when she returns to the hospital so that we can continue to support both patient and family.  Code Status/Advance Care Planning:  Partial (DNI)   Symptom Management:   Per primary.  Additional Recommendations (Limitations, Scope, Preferences):  Full Scope Treatment  Psycho-social/Spiritual:   Desire for further Chaplaincy support: welcomed.  Prognosis: unable to determine.  However given stage 4 CKD and advanced HF with NICM and a very low EF it would not be surprising if she has less than 6 months.    Discharge Planning: Home with Palliative Services      Primary Diagnoses: Present on Admission: . Hypertension . Acute on chronic combined systolic and diastolic CHF (congestive heart failure) ([place]) . Hypokalemia . CKD (chronic kidney disease), stage III ([place]) . Chest pain . Rapid atrial fibrillation ([place])   I have reviewed the medical record, interviewed the patient and family, and examined the patient. The following aspects are pertinent.  Past Medical History:  Diagnosis Date  . Chronic anemia   . Chronic systolic CHF (congestive heart failure) ([place])   . CKD (chronic kidney disease), stage IV ([place])   . Former tobacco use   .  History of tobacco abuse   . Hyperlipidemia   . Hypertension   . Mitral regurgitation   . Nonischemic cardiomyopathy ([place])    a. EF 10-15% on 11/2012 cath with no significant CAD. b. EF 25% in 2019. b. EF 15% in 12/2018  . PAF (paroxysmal atrial fibrillation) ([place])   . Protein calorie malnutrition ([place])   . Quit  consuming alcohol in remote past   . Sinus bradycardia 12/21/2012  . Ventricular tachycardia ([place]) 12/14/2012   Social History   Socioeconomic History  . Marital status: Widowed    Spouse name: Not on file  . Number of children: Not on file  . Years of education: Not on file  . Highest education level: Not on file  Occupational History  . Not on file  Social Needs  . Financial resource strain: Not on file  . Food insecurity    Worry: Not on file    Inability: Not on file  . Transportation needs    Medical: Not on file    Non-medical: Not on file  Tobacco Use  . Smoking status: Former Research scientist (life sciences)  . Smokeless tobacco: Never Used  Substance and Sexual Activity  . Alcohol use: No  . Drug use: No  . Sexual activity: Not Currently  Lifestyle  . Physical activity    Days per week: Not on file    Minutes per session: Not on file  . Stress: Not on file  Relationships  . Social Herbalist on phone: Not on file    Gets together: Not on file    Attends religious service: Not on file    Active member of club or organization: Not on file    Attends meetings of clubs or organizations: Not on file    Relationship status: Not on file  Other Topics Concern  . Not on file  Social History Narrative  . Not on file   Family History  Problem Relation Age of Onset  . Cancer Mother    Scheduled Meds: . amiodarone  200 mg Oral BID  . apixaban  5 mg Oral BID  . atorvastatin  40 mg Oral q1800  . carvedilol  3.125 mg Oral BID WC  . cholecalciferol  1,000 Units Oral QHS  . dextromethorphan-guaiFENesin  2 tablet Oral BID  . ferrous gluconate  324 mg Oral BID  .  furosemide  80 mg Oral Daily  . ipratropium  0.5 mg Nebulization BID  . isosorbide-hydrALAZINE  0.5 tablet Oral TID  . levalbuterol  0.63 mg Nebulization BID  . magnesium oxide  400 mg Oral Daily  . pantoprazole  40 mg Oral BID AC  . sodium chloride flush  3 mL Intravenous Q12H   Continuous Infusions: . sodium chloride     PRN Meds:.sodium chloride, acetaminophen, acetaminophen, alum & mag hydroxide-simeth, nitroGLYCERIN, ondansetron (ZOFRAN) IV, sodium chloride flush, zolpidem Allergies  Allergen Reactions  . Strawberry Extract Hives, Itching, Swelling and Other (See Comments)    Hives and itching    Review of Systems  Genitourinary: Positive for genital sores.   Denied chest pain, dysuria, changes in bowel habits, anxiety.  Physical Exam  Thin, fragile appearing, pleasant female, awake, alert, orientated, coherent Lying comfortably in bed.  Speaking in short phrases but without respiratory distress.  Vital Signs: BP 101/73   Pulse 73   Temp 98.2 F (36.8 C)   Resp 19   Ht 5\' 6"  (1.676 m)   Wt 63.5 kg Comment: scale a  SpO2 97%   BMI 22.58 kg/m  Pain Scale: 0-10   Pain Score: 0-No pain   SpO2: SpO2: 97 % O2 Device:SpO2: 97 % O2 Flow Rate: .O2 Flow Rate (L/min): 2 L/min  IO: Intake/output summary:   Intake/Output Summary (Last 24 hours) at 01/10/2019 1227 Last data filed at 01/10/2019 1100 Gross per 24 hour  Intake 1253 ml  Output 2501 ml  Net -1248 ml    LBM: Last BM Date: 01/08/19 Baseline Weight: Weight: 63.5 kg Most recent weight: Weight: 63.5 kg(scale a)     Palliative Assessment/Data: 40%     Time In: 1:00 Time Out: 2:10 Time Total: 70 min. Visit consisted of counseling and education dealing with the complex and emotionally intense issues surrounding the need for palliative care and symptom management in the setting of serious and potentially life-threatening illness. Greater than 50%  of this time was spent counseling and coordinating care  related to the above assessment and plan.  Signed by: Florentina Jenny, PA-C Palliative Medicine Pager: [phone]  Please contact Palliative Medicine Team phone at [phone] for questions and concerns.  For individual provider: See Shea Evans

## 2019-01-10 NOTE — Progress Notes (Addendum)
PROGRESS NOTE  Stephanie Yates OMB:[id number] DOB: 1946-06-06 DOA: 01/05/2019 PCP: Josetta Huddle, MD  HPI/Recap of past 24 hours: Stephanie Yates is a 73 y.o. femalewith medical history significant ofatrial fibrillation, systolic dysfunction CHF with EF of 25%, tobacco abuse,CKD III,hyperlipidemia and hypertension. She presented secondary to worsening dyspnea and chest discomfort concerning for acute on chronic heart failure. She has been started on IV diuresis.  01/10/19: Patient was seen and examined at her bedside.  She states she is breathing better today.  Post diuretics and bronchodilators.  She denies any chest pain or palpitations.  Started on a new medication today Bidil 0.5 tab 3 times daily.  We will continue to closely monitor her blood pressure on the new cardiac regimen.   Assessment/Plan: Principal Problem:   Acute on chronic combined systolic and diastolic CHF (congestive heart failure) (HCC) Active Problems:   Hypertension   Nonischemic cardiomyopathy (HCC)   Hypokalemia   History of tobacco abuse   CKD (chronic kidney disease), stage III (HCC)   Chest pain   Rapid atrial fibrillation (HCC)  Acute on chronic combined systolic and diastolic CHF Previous 2D echo done on 06/15/2018 revealed LVEF 20-25% Repeated 2D echo done during this admission showed LVEF 15% Patient has been appropriately diuresed and is currently euvolemic however her symptoms are persistent dyspnea at rest with symptoms of orthopnea. Independently reviewed last chest x-ray done on 01/07/2019 which showed cardiomegaly with mild increase in pulmonary vascularity suggestive of pulmonary edema.  This chest x-ray was improved from prior done on 01/05/2019. Net I&O -2.2 L  >> - 3.2L since admission Continue to monitor strict I's and O's and daily weight Home possibility medications include: lasix 80 daily    -prn metolazone as above    -bidil 0.5 tab tid    -carvedilol 3.125 bid    -amio 200 bid  -Apixaban 5 bid Cardiology will arrange for follow-up in heart failure clinic. Continue to closely monitor vital signs. Cardiology signed off on 01/10/2019. Plan to discharge to home with home health services if blood pressure is stable on new cardiac regimen.  Stephanie Coup Quiescent Heart failure/cardiology followed, amiodarone dose decreased to 200 mg twice daily Has AICD in place Follow-up with HF/cardiology outpatient  Improving dyspnea Improved with diuretics and bronchodilators Continue albuterol inhalers 2 puffs 3 times daily and ipratropium inhaler 2 puffs 3 times daily Negative procalcitonin   AKI on CKD 3 Patient presented with creatinine of 2.46 with GFR of 21 Creatinine this morning 1.93 from 1.93 yesterday Good urine output Baseline creatinine appears to be 1.7 with GFR of 33 Continue to avoid nephrotoxins Continue to monitor urine output  Paroxysmal A. fib Rate is controlled Continue Eliquis for CVA prevention Continue cardiac medications as recommended by heart failure team/cardiology  Generalized physical debility/ PT recommended health PT and rolling walkers with 5 inches wheels Fall precautions  Chronic microcytic anemia H&H stable hemoglobin stable at 11.6, MCV 74 on 01/10/2019 No sign of overt bleeding  Hypertension Blood pressure is soft Continue to closely monitor  DVT prophylaxis: Eliquis Code Status:   Code Status: Full Code Family Communication: None Disposition Plan:  Discharge tomorrow if blood pressure is stable on current cardiac regimen.  Objective: Vitals:   01/10/19 0002 01/10/19 0412 01/10/19 0814 01/10/19 1126  BP:  113/69  90/68  Pulse:  70 70 72  Resp:  18 18 19   Temp:  97.7 F (36.5 C)  98.2 F (36.8 C)  TempSrc:  Oral  SpO2:  91% 92% 96%  Weight: 63.5 kg     Height:        Intake/Output Summary (Last 24 hours) at 01/10/2019 1151 Last data filed at 01/10/2019 1100 Gross per 24 hour  Intake 1253 ml  Output 2501 ml  Net  -1248 ml   Filed Weights   01/08/19 0444 01/09/19 0350 01/10/19 0002  Weight: 63.7 kg 63.7 kg 63.5 kg    Exam:  . General: 73 y.o. year-old female well-developed well-nourished in no acute distress.  Alert and oriented x3. . Cardiovascular: Regular rate and rhythm with no rubs or gallops.  No JVD or thyromegaly noted.   Marland Kitchen Respiratory: Clear to auscultation with no wheezes or rales.  Poor inspiratory effort. . Abdomen: Soft nontender nondistended with normal bowel sounds x4 quadrants. . Musculoskeletal: No lower extremity edema.  2 out of 4 pulses in all 4 extremities.   Marland Kitchen Psychiatry: Mood is appropriate for condition and setting.  Data Reviewed: CBC: Recent Labs  Lab 01/05/19 2213 01/06/19 0241 01/10/19 0422  WBC 3.9* 5.5 5.8  NEUTROABS 1.8 2.3 2.6  HGB 10.8* 11.0* 11.6*  HCT 34.2* 34.6* 36.6  MCV 73.9* 74.2* 74.1*  PLT 196 210 010   Basic Metabolic Panel: Recent Labs  Lab 01/05/19 2213 01/07/19 0337 01/08/19 0739 01/09/19 0330 01/10/19 0422  NA 135 137 137 137 136  K 4.1 3.9 4.2 4.7 4.4  CL 105 103 106 106 105  CO2 22 24 23 23 24   GLUCOSE 126* 95 86 86 88  BUN 24* 28* 34* 32* 26*  CREATININE 2.03* 2.46* 2.17* 1.93* 1.93*  CALCIUM 8.9 8.6* 8.9 8.9 9.1  MG  --   --   --  2.2  --    GFR: Estimated Creatinine Clearance: 23.6 mL/min (A) (by C-G formula based on SCr of 1.93 mg/dL (H)). Liver Function Tests: Recent Labs  Lab 01/05/19 2213  AST 62*  ALT 26  ALKPHOS 114  BILITOT 1.3*  PROT 7.5  ALBUMIN 3.1*   No results for input(s): LIPASE, AMYLASE in the last 168 hours. No results for input(s): AMMONIA in the last 168 hours. Coagulation Profile: No results for input(s): INR, PROTIME in the last 168 hours. Cardiac Enzymes: No results for input(s): CKTOTAL, CKMB, CKMBINDEX, TROPONINI in the last 168 hours. BNP (last 3 results) No results for input(s): PROBNP in the last 8760 hours. HbA1C: No results for input(s): HGBA1C in the last 72 hours. CBG: No  results for input(s): GLUCAP in the last 168 hours. Lipid Profile: No results for input(s): CHOL, HDL, LDLCALC, TRIG, CHOLHDL, LDLDIRECT in the last 72 hours. Thyroid Function Tests: Recent Labs    01/10/19 0422  FREET4 1.86*   Anemia Panel: No results for input(s): VITAMINB12, FOLATE, FERRITIN, TIBC, IRON, RETICCTPCT in the last 72 hours. Urine analysis:    Component Value Date/Time   COLORURINE YELLOW 08/29/2014 2035   APPEARANCEUR CLOUDY (A) 08/29/2014 2035   LABSPEC 1.017 08/29/2014 2035   PHURINE 6.0 08/29/2014 2035   GLUCOSEU NEGATIVE 08/29/2014 2035   HGBUR SMALL (A) 08/29/2014 2035   BILIRUBINUR NEGATIVE 08/29/2014 2035   KETONESUR NEGATIVE 08/29/2014 2035   PROTEINUR NEGATIVE 08/29/2014 2035   UROBILINOGEN 1.0 08/29/2014 2035   NITRITE NEGATIVE 08/29/2014 2035   LEUKOCYTESUR TRACE (A) 08/29/2014 2035   Sepsis Labs: @LABRCNTIP (procalcitonin:4,lacticidven:4)  ) Recent Results (from the past 240 hour(s))  SARS Coronavirus 2 (CEPHEID - Performed in [facility] hospital lab), Hosp Order     Status: None  Collection Time: 01/05/19 10:31 PM   Specimen: Nasopharyngeal Swab  Result Value Ref Range Status   SARS Coronavirus 2 NEGATIVE NEGATIVE Final    Comment: (NOTE) If result is NEGATIVE SARS-CoV-2 target nucleic acids are NOT DETECTED. The SARS-CoV-2 RNA is generally detectable in upper and lower  respiratory specimens during the acute phase of infection. The lowest  concentration of SARS-CoV-2 viral copies this assay can detect is 250  copies / mL. A negative result does not preclude SARS-CoV-2 infection  and should not be used as the sole basis for treatment or other  patient management decisions.  A negative result may occur with  improper specimen collection / handling, submission of specimen other  than nasopharyngeal swab, presence of viral mutation(s) within the  areas targeted by this assay, and inadequate number of viral copies  (<250 copies / mL). A  negative result must be combined with clinical  observations, patient history, and epidemiological information. If result is POSITIVE SARS-CoV-2 target nucleic acids are DETECTED. The SARS-CoV-2 RNA is generally detectable in upper and lower  respiratory specimens dur ing the acute phase of infection.  Positive  results are indicative of active infection with SARS-CoV-2.  Clinical  correlation with patient history and other diagnostic information is  necessary to determine patient infection status.  Positive results do  not rule out bacterial infection or co-infection with other viruses. If result is PRESUMPTIVE POSTIVE SARS-CoV-2 nucleic acids MAY BE PRESENT.   A presumptive positive result was obtained on the submitted specimen  and confirmed on repeat testing.  While 2019 novel coronavirus  (SARS-CoV-2) nucleic acids may be present in the submitted sample  additional confirmatory testing may be necessary for epidemiological  and / or clinical management purposes  to differentiate between  SARS-CoV-2 and other Sarbecovirus currently known to infect humans.  If clinically indicated additional testing with an alternate test  methodology [phone]) is advised. The SARS-CoV-2 RNA is generally  detectable in upper and lower respiratory sp ecimens during the acute  phase of infection. The expected result is Negative. Fact Sheet for Patients:  StrictlyIdeas.no Fact Sheet for Healthcare Providers: BankingDealers.co.za This test is not yet approved or cleared by the [place] FDA and has been authorized for detection and/or diagnosis of SARS-CoV-2 by FDA under an Emergency Use Authorization (EUA).  This EUA will remain in effect (meaning this test can be used) for the duration of the COVID-19 declaration under Section 564(b)(1) of the Act, 21 U.S.C. section 360bbb-3(b)(1), unless the authorization is terminated or revoked sooner. Performed  at [facility], [place] [address] [place], [place] [address]       Studies: No results found.  Scheduled Meds: . amiodarone  200 mg Oral BID  . apixaban  5 mg Oral BID  . atorvastatin  40 mg Oral q1800  . carvedilol  3.125 mg Oral BID WC  . cholecalciferol  1,000 Units Oral QHS  . dextromethorphan-guaiFENesin  2 tablet Oral BID  . ferrous gluconate  324 mg Oral BID  . furosemide  80 mg Oral Daily  . ipratropium  0.5 mg Nebulization BID  . isosorbide-hydrALAZINE  0.5 tablet Oral TID  . levalbuterol  0.63 mg Nebulization BID  . magnesium oxide  400 mg Oral Daily  . pantoprazole  40 mg Oral BID AC  . sodium chloride flush  3 mL Intravenous Q12H    Continuous Infusions: . sodium chloride       LOS: 4 days  Kayleen Memos, MD [facility] Pager [phone]  If 7PM-7AM, please contact night-coverage [email or web address] Password [facility] 01/10/2019, 11:51 AM

## 2019-01-11 ENCOUNTER — Inpatient Hospital Stay (HOSPITAL_COMMUNITY): Payer: Medicare Other

## 2019-01-11 LAB — BASIC METABOLIC PANEL
Anion gap: 10 (ref 5–15)
BUN: 32 mg/dL — ABNORMAL HIGH (ref 8–23)
CO2: 23 mmol/L (ref 22–32)
Calcium: 9 mg/dL (ref 8.9–10.3)
Chloride: 104 mmol/L (ref 98–111)
Creatinine, Ser: 2.14 mg/dL — ABNORMAL HIGH (ref 0.44–1.00)
GFR calc Af Amer: 25 mL/min — ABNORMAL LOW (ref >60)
GFR calc non Af Amer: 22 mL/min — ABNORMAL LOW (ref >60)
Glucose, Bld: 83 mg/dL (ref 70–99)
Potassium: 3.8 mmol/L (ref 3.5–5.1)
Sodium: 137 mmol/L (ref 135–145)

## 2019-01-11 LAB — T3: T3, Total: 93 ng/dL (ref 71–180)

## 2019-01-11 MED ORDER — LEVALBUTEROL HCL 0.63 MG/3ML IN NEBU: 0.6300 mg | INHALATION_SOLUTION | Freq: Four times a day (QID) | RESPIRATORY_TRACT | Status: DC | PRN

## 2019-01-11 MED ORDER — HYDRALAZINE HCL 25 MG PO TABS
12.5000 mg | ORAL_TABLET | Freq: Three times a day (TID) | ORAL | Status: DC
  Administered 2019-01-11 – 2019-01-12 (×4): 12.5 mg via ORAL
  Filled 2019-01-11 (×4): qty 1

## 2019-01-11 MED ORDER — ISOSORBIDE MONONITRATE ER 30 MG PO TB24
15.0000 mg | ORAL_TABLET | Freq: Every day | ORAL | Status: DC
  Administered 2019-01-11 – 2019-01-12 (×2): 15 mg via ORAL
  Filled 2019-01-11 (×2): qty 1

## 2019-01-11 NOTE — Consult Note (Signed)
[facility]    NEPHROLOGY CONSULTATION NOTE  PATIENT ID:  Stephanie Yates, DOB:  [birth year]  HPI: The patient is a 73 y.o. year old female 2 was admitted with Worsening dyspnea and chest discomfort and was diagnosed with acute on chronic heart failure.  She was started on IV diuresis and then switched to oral.  Her ejection fraction was notably 15% on an echo from 01/07/19.  She was noted to have worsening renal function on the diuretics, prompting renal consultation. Review of prior records reveals a baseline serum creatinine of about 1.7-1.9.  She has a long-standing history of hypertension.  She denies any history of NSAID use.  She has a history of nephrolithiasis as well.  On interview today, she was working with physical therapy and was walking around the room without shortness of breath.   Past Medical History:  Diagnosis Date  . Chronic anemia   . Chronic systolic CHF (congestive heart failure) ([place])   . CKD (chronic kidney disease), stage IV ([place])   . Former tobacco use   . History of tobacco abuse   . Hyperlipidemia   . Hypertension   . Mitral regurgitation   . Nonischemic cardiomyopathy ([place])    a. EF 10-15% on 11/2012 cath with no significant CAD. b. EF 25% in 2019. b. EF 15% in 12/2018  . PAF (paroxysmal atrial fibrillation) ([place])   . Protein calorie malnutrition ([place])   . Quit consuming alcohol in remote past   . Sinus bradycardia 12/21/2012  . Ventricular tachycardia ([place]) 12/14/2012    Past Surgical History:  Procedure Laterality Date  . ESOPHAGOGASTRODUODENOSCOPY (EGD) WITH PROPOFOL N/A 06/18/2017   Procedure: ESOPHAGOGASTRODUODENOSCOPY (EGD) WITH PROPOFOL;  Surgeon: Ronnette Juniper, MD;  Location: [place];  Service: Gastroenterology;  Laterality: N/A;  . IMPLANTABLE CARDIOVERTER DEFIBRILLATOR IMPLANT  12/19/2012   St. Jude dual-chamber ICD, serial number [id number]  . IMPLANTABLE CARDIOVERTER DEFIBRILLATOR IMPLANT N/A 12/19/2012   Procedure: IMPLANTABLE  CARDIOVERTER DEFIBRILLATOR IMPLANT;  Surgeon: Deboraha Sprang, MD;  Location: [facility] CATH LAB;  Service: Cardiovascular;  Laterality: N/A;  . LEFT AND RIGHT HEART CATHETERIZATION WITH CORONARY ANGIOGRAM N/A 12/17/2012   Procedure: LEFT AND RIGHT HEART CATHETERIZATION WITH CORONARY ANGIOGRAM;  Surgeon: Sherren Mocha, MD;  Location[facility] C[facility] LAB;  Service: Cardiovascular;  Laterality: N/A;    Family History  Problem Relation Age of Onset  . Cancer Mother     Social History   Tobacco Use  . Smoking status: Former Research scientist (life sciences)  . Smokeless tobacco: Never Used  Substance Use Topics  . Alcohol use: No  . Drug use: No    REVIEW OF SYSTEMS: General:  no fatigue, Positive weakness Head:  no headaches Eyes:  no blurred vision ENT:  no sore throat Neck:  no masses CV:  no chest pain, no orthopnea Lungs:  no shortness of breath, no cough GI:  no nausea or vomiting, no diarrhea GU:  no dysuria or hematuria Skin:  no rashes or lesions Neuro:  no focal numbness or weakness Psych:  no depression or anxiety    PHYSICAL EXAM:  Vitals:   01/11/19 0919 01/11/19 1120  BP: 119/74 107/60  Pulse: 70 72  Resp:    Temp:    SpO2:     I/O last 3 completed shifts: In: 3557 [P.O.:1130; I.V.:6] Out: 2901 [Urine:2900; Stool:1]   General:  AAOx3 NAD HEENT: MMM [place] AT anicteric sclera Neck:  No JVD, no adenopathy CV:  Heart RRR  Lungs:  L/S With fine  bibasilar rales Abd:  abd SNT/ND with normal BS GU:  Bladder non-palpable Extremities:  +1 bilateral extremity edema Skin:  No skin rash Psych:  normal mood and affect Neuro:  no focal deficits   CURRENT MEDICATIONS:  . amiodarone  200 mg Oral BID  . apixaban  5 mg Oral BID  . atorvastatin  40 mg Oral q1800  . carvedilol  3.125 mg Oral BID WC  . cholecalciferol  1,000 Units Oral QHS  . dextromethorphan-guaiFENesin  2 tablet Oral BID  . ferrous gluconate  324 mg Oral BID  . furosemide  80 mg Oral Daily  . hydrALAZINE  12.5 mg Oral TID  .  ipratropium  0.5 mg Nebulization BID  . isosorbide mononitrate  15 mg Oral Daily  . levalbuterol  0.63 mg Nebulization BID  . magnesium oxide  400 mg Oral Daily  . pantoprazole  40 mg Oral BID AC  . sodium chloride flush  3 mL Intravenous Q12H     HOME MEDICATIONS:  Prior to Admission medications   Medication Sig Start Date End Date Taking? Authorizing Provider  acetaminophen (TYLENOL) 500 MG tablet Take 1,000 mg by mouth every 6 (six) hours as needed for moderate pain.   Yes [provider]  amiodarone (PACERONE) 200 MG tablet Take 400 mg by mouth 2 (two) times daily.   Yes [provider]  atorvastatin (LIPITOR) 40 MG tablet Take 1 tablet (40 mg total) by mouth daily at 6 PM. 07/05/18  Yes Deboraha Sprang, MD  carvedilol (COREG) 3.125 MG tablet Take 1 tablet (3.125 mg total) by mouth 2 (two) times daily with a meal. 09/12/18  Yes Deboraha Sprang, MD  Cholecalciferol (VITAMIN D-3 PO) Take 1,000 Units by mouth at bedtime.    Yes [provider]  ELIQUIS 2.5 MG TABS tablet TAKE 1 TABLET BY MOUTH TWICE DAILY Patient taking differently: Take 2.5 mg by mouth 2 (two) times daily.  08/13/18  Yes Deboraha Sprang, MD  ferrous gluconate (FERGON) 324 MG tablet Take 1 tablet by mouth 2 (two) times daily. 07/05/18  Yes [provider]  furosemide (LASIX) 80 MG tablet Take 1 tablet (80 mg total) by mouth daily. 07/05/18  Yes Deboraha Sprang, MD  magnesium oxide (MAG-OX) 400 (241.3 Mg) MG tablet Take 1 tablet (400 mg total) by mouth daily. 07/17/18  Yes Deboraha Sprang, MD  pantoprazole (PROTONIX) 40 MG tablet Take 1 tablet (40 mg total) by mouth 2 (two) times daily before a meal. 06/18/17  Yes Lavina Hamman, MD  potassium chloride SA (K-DUR,KLOR-CON) 20 MEQ tablet Take 1 tablet (20 mEq total) by mouth daily. 09/12/18  Yes Deboraha Sprang, MD  amiodarone (PACERONE) 200 MG tablet Take 2 tablets (400 mg total) by mouth 2 (two) times daily for 14 days, THEN 1 tablet (200 mg total)  2 (two) times daily for 14 days, THEN 1 tablet (200 mg total) daily. Patient not taking: Reported on 01/06/2019 12/27/18 02/23/19  Shirley Friar, PA-C  oseltamivir (TAMIFLU) 30 MG capsule Take 1 capsule (30 mg total) by mouth daily. Patient not taking: Reported on 01/06/2019 09/06/18   Georgette Shell, MD  predniSONE (DELTASONE) 10 MG tablet Take 4 tablets for 2 days then 3 tablets for 2 days then 2 tablets for 2 days and then 1 tablet daily till done Patient not taking: Reported on 01/06/2019 09/06/18   Georgette Shell, MD       LABS:  CBC Latest Ref  Rng & Units 01/10/2019 01/06/2019 01/05/2019  WBC 4.0 - 10.5 K/uL 5.8 5.5 3.9(L)  Hemoglobin 12.0 - 15.0 g/dL 11.6(L) 11.0(L) 10.8(L)  Hematocrit 36.0 - 46.0 % 36.6 34.6(L) 34.2(L)  Platelets 150 - 400 K/uL 215 210 196    CMP Latest Ref Rng & Units 01/11/2019 01/10/2019 01/09/2019  Glucose 70 - 99 mg/dL 83 88 86  BUN 8 - 23 mg/dL 32(H) 26(H) 32(H)  Creatinine 0.44 - 1.00 mg/dL 2.14(H) 1.93(H) 1.93(H)  Sodium 135 - 145 mmol/L 137 136 137  Potassium 3.5 - 5.1 mmol/L 3.8 4.4 4.7  Chloride 98 - 111 mmol/L 104 105 106  CO2 22 - 32 mmol/L 23 24 23   Calcium 8.9 - 10.3 mg/dL 9.0 9.1 8.9  Total Protein 6.5 - 8.1 g/dL - - -  Total Bilirubin 0.3 - 1.2 mg/dL - - -  Alkaline Phos 38 - 126 U/L - - -  AST 15 - 41 U/L - - -  ALT 0 - 44 U/L - - -    Lab Results  Component Value Date   CALCIUM 9.0 01/11/2019   PHOS 2.6 09/02/2018       Component Value Date/Time   COLORURINE YELLOW 08/29/2014 2035   APPEARANCEUR CLOUDY (A) 08/29/2014 2035   LABSPEC 1.017 08/29/2014 2035   PHURINE 6.0 08/29/2014 2035   GLUCOSEU NEGATIVE 08/29/2014 2035   HGBUR SMALL (A) 08/29/2014 2035   BILIRUBINUR NEGATIVE 08/29/2014 2035   KETONESUR NEGATIVE 08/29/2014 2035   PROTEINUR NEGATIVE 08/29/2014 2035   UROBILINOGEN 1.0 08/29/2014 2035   NITRITE NEGATIVE 08/29/2014 2035   LEUKOCYTESUR TRACE (A) 08/29/2014 2035      Component Value Date/Time   PHART  7.375 12/17/2012 1805   PCO2ART 45.4 (H) 12/17/2012 1805   PO2ART 110.0 (H) 12/17/2012 1805   HCO3 26.6 (H) 12/17/2012 1805   TCO2 28 12/17/2012 1805   O2SAT 98.0 12/17/2012 1805       Component Value Date/Time   IRON 22 (L) 06/15/2017 1902   TIBC 470 (H) 06/15/2017 1902   FERRITIN 15 06/17/2017 1447   IRONPCTSAT 5 (L) 06/15/2017 1902       ASSESSMENT/PLAN:    73 year old female patient with a past medical history significant for chronic kidney disease stage III with baseline serum creatinine around 1.7-1.9 Who presents with acute on chronic combined systolic and diastolic congestive heart failure.  1.  Chronic kidney disease stage III.  Her baseline serum creatinine runs around 1.7-1.9. Likely secondary to long-standing hypertension.  We'll check a urinalysis for completeness And renal ultrasound for completeness.  2.  Acute kidney injury.  In the setting of diuresis.  I suspect this is related to diuretics.  Appears to be relatively euvolemic today.  Is oxygenating well on room air.  Blood pressures are stable.Agree with by mouth diuretics.  No indication for dialysis.  3.  Acute on chronic systolic and diastolic congestive heart failure.  ACE inhibitor on hold in the setting of acute kidney injury.  Continue  Diuresis.  Continue carvedilol, hydralazine, and imdur.  Cardiology is following.  [place], DO, [place]

## 2019-01-11 NOTE — Progress Notes (Signed)
PROGRESS NOTE  Stephanie Yates TWS:[id number] DOB: 1946-01-25 DOA: 01/05/2019 PCP: Josetta Huddle, MD  HPI/Recap of past 24 hours: Stephanie Yates is a 73 y.o. femalewith medical history significant ofatrial fibrillation, systolic dysfunction CHF with EF of 25%, tobacco abuse,CKD III,hyperlipidemia and hypertension. She presented secondary to worsening dyspnea and chest discomfort concerning for acute on chronic heart failure. She was started on IV diuresis then switched to oral. Worsening LVEF 15% on 2D echo 01/07/19. Worsening AKI on diuretics. Cardiology following. Dyspnea improved on diuretics and bronchodilators.  01/11/19: Patient was seen and examined at her bedside.  States her breathing is improved with her bronchodilators.  She denies any chest pain or palpitations.  Her nonproductive cough is also improving. Net I&O -4.1L since admission.  Creatinine up-trending 2.14 from 1.93 yesterday, baseline creatinine 1.6 with GFR of 35.  Will consult nephrology to further assess.   Assessment/Plan: Principal Problem:   Acute on chronic combined systolic and diastolic CHF (congestive heart failure) (HCC) Active Problems:   Hypertension   Nonischemic cardiomyopathy (HCC)   Hypokalemia   History of tobacco abuse   CKD (chronic kidney disease), stage III (HCC)   Chest pain   Rapid atrial fibrillation [facility])   Palliative care encounter  Acute on chronic combined systolic and diastolic CHF Previous 2D echo done on 06/15/2018 revealed LVEF 20-25% Repeated 2D echo 01/07/19 showed LVEF 15% BNP greater than 1700 on 01/10/2019 Currently on p.o. Lasix 80 mg daily Net I&O negative 4.1 L since admission Continue medications as recommended by cardiology Home possibility medications include: lasix 80 daily    -prn metolazone as above    -bidil 0.5 tab tid    -carvedilol 3.125 bid    -amio 200 bid    -Apixaban 5 bid Cardiology will arrange for follow-up in heart failure clinic. Continue to  closely monitor vital signs and renal function.  Cardiology signed off on 01/10/2019.  Nonsustained V. Tach Quiescent Heart failure/cardiology followed, amiodarone dose decreased to 200 mg twice daily Has AICD in place Follow-up with HF/cardiology outpatient  Worsening AKI on CKD 3 Baseline creatinine appears to be 1.6 with GFR 35 Uptrending creatinine, 2.140 from 1.93 yesterday Presented with creatinine of 2.46 and GFR of 21 Continue to monitor urine output Continue to avoid nephrotoxins Nephrology consulted Repeat BMP in the morning  Improving dyspnea Improved with diuretics and bronchodilators Continue albuterol inhalers 2 puffs 3 times daily and ipratropium inhaler 2 puffs 3 times daily Negative procalcitonin  Afebrile with no leukocytosis Home O2 evaluation prior to discharge  Paroxysmal A. fib Rate is controlled Continue Eliquis for CVA prevention Continue cardiac medications as recommended by heart failure team/cardiology  Generalized physical debility/ PT recommended home health PT and rolling walkers with 5 inches wheels Fall precautions  Chronic microcytic anemia H&H stable hemoglobin stable at 11.6, MCV 74 on 01/10/2019 No sign of overt bleeding  Hypertension Blood pressure is normotensive this morning Continue to closely monitor on telemetry  DVT prophylaxis: Eliquis Code Status:   Code Status: Full Code Family Communication: None Disposition Plan:  Discharge possibly in 1 to 2 days or when nephrology signs off.  Consultants: Cardiology, nephrology.   Objective: Vitals:   01/10/19 1921 01/10/19 2037 01/11/19 0427 01/11/19 0802  BP: (!) 118/55  135/74   Pulse: 73 80 70   Resp: 18 20 18    Temp: 98.2 F (36.8 C)  98 F (36.7 C)   TempSrc: Oral  Oral   SpO2: 92% 93% 93% 97%  Weight:      Height:        Intake/Output Summary (Last 24 hours) at 01/11/2019 0826 Last data filed at 01/11/2019 0416 Gross per 24 hour  Intake 603 ml  Output 1801 ml   Net -1198 ml   Filed Weights   01/08/19 0444 01/09/19 0350 01/10/19 0002  Weight: 63.7 kg 63.7 kg 63.5 kg    Exam:  . General: 73 y.o. year-old female 50 built in no acute distress.  Alert and oriented x3.  . Cardiovascular: Regular rate and rhythm.  No rubs or gallops.  No JVD or thyromegaly.   Marland Kitchen Respiratory: Clear to auscultation.  No wheezes.  No rales.  Poor respiratory effort. . Abdomen: Soft nontender nondistended with normal bowel sounds times 4 quadrants. . Musculoskeletal: No lower extremity edema.  2 out of 4 pulses in all 4 extremities. Marland Kitchen Psychiatry: Mood is appropriate for condition and setting. .  Data Reviewed: CBC: Recent Labs  Lab 01/05/19 2213 01/06/19 0241 01/10/19 0422  WBC 3.9* 5.5 5.8  NEUTROABS 1.8 2.3 2.6  HGB 10.8* 11.0* 11.6*  HCT 34.2* 34.6* 36.6  MCV 73.9* 74.2* 74.1*  PLT 196 210 505   Basic Metabolic Panel: Recent Labs  Lab 01/07/19 0337 01/08/19 0739 01/09/19 0330 01/10/19 0422 01/11/19 0142  NA 137 137 137 136 137  K 3.9 4.2 4.7 4.4 3.8  CL 103 106 106 105 104  CO2 24 23 23 24 23   GLUCOSE 95 86 86 88 83  BUN 28* 34* 32* 26* 32*  CREATININE 2.46* 2.17* 1.93* 1.93* 2.14*  CALCIUM 8.6* 8.9 8.9 9.1 9.0  MG  --   --  2.2  --   --    GFR: Estimated Creatinine Clearance: 21.3 mL/min (A) (by C-G formula based on SCr of 2.14 mg/dL (H)). Liver Function Tests: Recent Labs  Lab 01/05/19 2213  AST 62*  ALT 26  ALKPHOS 114  BILITOT 1.3*  PROT 7.5  ALBUMIN 3.1*   No results for input(s): LIPASE, AMYLASE in the last 168 hours. No results for input(s): AMMONIA in the last 168 hours. Coagulation Profile: No results for input(s): INR, PROTIME in the last 168 hours. Cardiac Enzymes: No results for input(s): CKTOTAL, CKMB, CKMBINDEX, TROPONINI in the last 168 hours. BNP (last 3 results) No results for input(s): PROBNP in the last 8760 hours. HbA1C: No results for input(s): HGBA1C in the last 72 hours. CBG: No results for input(s):  GLUCAP in the last 168 hours. Lipid Profile: No results for input(s): CHOL, HDL, LDLCALC, TRIG, CHOLHDL, LDLDIRECT in the last 72 hours. Thyroid Function Tests: Recent Labs    01/10/19 0422  FREET4 1.86*   Anemia Panel: No results for input(s): VITAMINB12, FOLATE, FERRITIN, TIBC, IRON, RETICCTPCT in the last 72 hours. Urine analysis:    Component Value Date/Time   COLORURINE YELLOW 08/29/2014 2035   APPEARANCEUR CLOUDY (A) 08/29/2014 2035   LABSPEC 1.017 08/29/2014 2035   PHURINE 6.0 08/29/2014 2035   GLUCOSEU NEGATIVE 08/29/2014 2035   HGBUR SMALL (A) 08/29/2014 2035   BILIRUBINUR NEGATIVE 08/29/2014 2035   KETONESUR NEGATIVE 08/29/2014 2035   PROTEINUR NEGATIVE 08/29/2014 2035   UROBILINOGEN 1.0 08/29/2014 2035   NITRITE NEGATIVE 08/29/2014 2035   LEUKOCYTESUR TRACE (A) 08/29/2014 2035   Sepsis Labs: @LABRCNTIP (procalcitonin:4,lacticidven:4)  ) Recent Results (from the past 240 hour(s))  SARS Coronavirus 2 (CEPHEID - Performed in [place] hospital lab), Hosp Order     Status: None   Collection Time: 01/05/19 10:31 PM  Specimen: Nasopharyngeal Swab  Result Value Ref Range Status   SARS Coronavirus 2 NEGATIVE NEGATIVE Final    Comment: (NOTE) If result is NEGATIVE SARS-CoV-2 target nucleic acids are NOT DETECTED. The SARS-CoV-2 RNA is generally detectable in upper and lower  respiratory specimens during the acute phase of infection. The lowest  concentration of SARS-CoV-2 viral copies this assay can detect is 250  copies / mL. A negative result does not preclude SARS-CoV-2 infection  and should not be used as the sole basis for treatment or other  patient management decisions.  A negative result may occur with  improper specimen collection / handling, submission of specimen other  than nasopharyngeal swab, presence of viral mutation(s) within the  areas targeted by this assay, and inadequate number of viral copies  (<250 copies / mL). A negative result must be  combined with clinical  observations, patient history, and epidemiological information. If result is POSITIVE SARS-CoV-2 target nucleic acids are DETECTED. The SARS-CoV-2 RNA is generally detectable in upper and lower  respiratory specimens dur ing the acute phase of infection.  Positive  results are indicative of active infection with SARS-CoV-2.  Clinical  correlation with patient history and other diagnostic information is  necessary to determine patient infection status.  Positive results do  not rule out bacterial infection or co-infection with other viruses. If result is PRESUMPTIVE POSTIVE SARS-CoV-2 nucleic acids MAY BE PRESENT.   A presumptive positive result was obtained on the submitted specimen  and confirmed on repeat testing.  While 2019 novel coronavirus  (SARS-CoV-2) nucleic acids may be present in the submitted sample  additional confirmatory testing may be necessary for epidemiological  and / or clinical management purposes  to differentiate between  SARS-CoV-2 and other Sarbecovirus currently known to infect humans.  If clinically indicated additional testing with an alternate test  methodology [phone]) is advised. The SARS-CoV-2 RNA is generally  detectable in upper and lower respiratory sp ecimens during the acute  phase of infection. The expected result is Negative. Fact Sheet for Patients:  StrictlyIdeas.no Fact Sheet for Healthcare Providers: BankingDealers.co.za This test is not yet approved or cleared by the [place] FDA and has been authorized for detection and/or diagnosis of SARS-CoV-2 by FDA under an Emergency Use Authorization (EUA).  This EUA will remain in effect (meaning this test can be used) for the duration of the COVID-19 declaration under Section 564(b)(1) of the Act, 21 U.S.C. section 360bbb-3(b)(1), unless the authorization is terminated or revoked sooner. Performed at [facility], [place] [address], [place], [place] [address]       Studies: No results found.  Scheduled Meds: . amiodarone  200 mg Oral BID  . apixaban  5 mg Oral BID  . atorvastatin  40 mg Oral q1800  . carvedilol  3.125 mg Oral BID WC  . cholecalciferol  1,000 Units Oral QHS  . dextromethorphan-guaiFENesin  2 tablet Oral BID  . ferrous gluconate  324 mg Oral BID  . furosemide  80 mg Oral Daily  . ipratropium  0.5 mg Nebulization BID  . isosorbide-hydrALAZINE  0.5 tablet Oral TID  . levalbuterol  0.63 mg Nebulization BID  . magnesium oxide  400 mg Oral Daily  . pantoprazole  40 mg Oral BID AC  . sodium chloride flush  3 mL Intravenous Q12H    Continuous Infusions: . sodium chloride       LOS: 5 days     Kayleen Memos, MD [facility] Pager  [phone]  If 7PM-7AM, please contact night-coverage [email or web address] Password [facility] 01/11/2019, 8:26 AM

## 2019-01-11 NOTE — Progress Notes (Signed)
Advanced Heart Failure Rounding Note   Subjective:    Feeling ok. Able to ambulate room. No SOB, orthopnea or PND. Only got one dose of Bidil as BP was low  ReDS measurement of lung water 29% (normal range 25-35%) yesterday  Seen by Palliative Care. She haas been resistant to further discussions.   Objective:   Weight Range:  Vital Signs:   Temp:  [98 F (36.7 C)-98.2 F (36.8 C)] 98 F (36.7 C) (07/17 0427) Pulse Rate:  [70-80] 72 (07/17 1120) Resp:  [18-20] 18 (07/17 0427) BP: (104-135)/(55-74) 107/60 (07/17 1120) SpO2:  [92 %-97 %] 97 % (07/17 0802) Last BM Date: 01/10/19  Weight change: Filed Weights   01/08/19 0444 01/09/19 0350 01/10/19 0002  Weight: 63.7 kg 63.7 kg 63.5 kg    Intake/Output:   Intake/Output Summary (Last 24 hours) at 01/11/2019 1515 Last data filed at 01/11/2019 1357 Gross per 24 hour  Intake 483 ml  Output 1100 ml  Net -617 ml     Physical Exam: General:  Elderly. No resp difficulty HEENT: normal Neck: supple. JVP 6. Carotids 2+ bilat; no bruits. No lymphadenopathy or thryomegaly appreciated. Cor: PMI nondisplaced. Regular rate & rhythm. No rubs, gallops or murmurs. Lungs: clear Abdomen: soft, nontender, nondistended. No hepatosplenomegaly. No bruits or masses. Good bowel sounds. Extremities: no cyanosis, clubbing, rash, edema Neuro: alert & orientedx3, cranial nerves grossly intact. moves all 4 extremities w/o difficulty. Affect pleasant   Telemetry: NSR 70-80 Personally reviewed   Labs: Basic Metabolic Panel: Recent Labs  Lab 01/07/19 0337 01/08/19 0739 01/09/19 0330 01/10/19 0422 01/11/19 0142  NA 137 137 137 136 137  K 3.9 4.2 4.7 4.4 3.8  CL 103 106 106 105 104  CO2 24 23 23 24 23   GLUCOSE 95 86 86 88 83  BUN 28* 34* 32* 26* 32*  CREATININE 2.46* 2.17* 1.93* 1.93* 2.14*  CALCIUM 8.6* 8.9 8.9 9.1 9.0  MG  --   --  2.2  --   --     Liver Function Tests: Recent Labs  Lab 01/05/19 2213  AST 62*  ALT 26   ALKPHOS 114  BILITOT 1.3*  PROT 7.5  ALBUMIN 3.1*   No results for input(s): LIPASE, AMYLASE in the last 168 hours. No results for input(s): AMMONIA in the last 168 hours.  CBC: Recent Labs  Lab 01/05/19 2213 01/06/19 0241 01/10/19 0422  WBC 3.9* 5.5 5.8  NEUTROABS 1.8 2.3 2.6  HGB 10.8* 11.0* 11.6*  HCT 34.2* 34.6* 36.6  MCV 73.9* 74.2* 74.1*  PLT 196 210 215    Cardiac Enzymes: No results for input(s): CKTOTAL, CKMB, CKMBINDEX, TROPONINI in the last 168 hours.  BNP: BNP (last 3 results) Recent Labs    09/02/18 1541 01/05/19 2213 01/10/19 0422  BNP 819.2* 1,129.5* 1,735.2*    ProBNP (last 3 results) No results for input(s): PROBNP in the last 8760 hours.    Other results:  Imaging: No results found.   Medications:     Scheduled Medications: . amiodarone  200 mg Oral BID  . apixaban  5 mg Oral BID  . atorvastatin  40 mg Oral q1800  . carvedilol  3.125 mg Oral BID WC  . cholecalciferol  1,000 Units Oral QHS  . dextromethorphan-guaiFENesin  2 tablet Oral BID  . ferrous gluconate  324 mg Oral BID  . furosemide  80 mg Oral Daily  . hydrALAZINE  12.5 mg Oral TID  . ipratropium  0.5 mg Nebulization BID  .  isosorbide mononitrate  15 mg Oral Daily  . levalbuterol  0.63 mg Nebulization BID  . magnesium oxide  400 mg Oral Daily  . pantoprazole  40 mg Oral BID AC  . sodium chloride flush  3 mL Intravenous Q12H    Infusions: . sodium chloride      PRN Medications: sodium chloride, acetaminophen, acetaminophen, alum & mag hydroxide-simeth, nitroGLYCERIN, ondansetron (ZOFRAN) IV, sodium chloride flush, zolpidem   Assessment/Plan   1. Acute on chronic combined CHF -  - symptomatically much improved Now euvloemic on exam and by ReDS device (29%) - from our stand point can go home today  - [facility] HF program with REDS monitoring.  - Continue lasix 80 po daily at home. Add metolazone 2.5 mg with K 20 me prn for weight 145 or greater  - Change Bidil to low-dose hydral/nitrates - Home HF meds     -lasix 80 daily    -prn metolazone as above    -hydralazine 12.5 tid and Imdur 15 daily    -carvedilol 3.125 bid    -amio 200 bid    -Apixaban 5 bid  Will arrange f/u in HF Clinic  Unfortunately given debility and renal failure, I do not think she is a candidate for advanced therapies. (I.e VAD). Also with recurrent VT I would not consider IV inotropes. We talked extensively about her situation and she said clearly that she would not want Hospice at this point but also would never want to be on a ventilator (CPR ok). She has agreed to Palliative care visit to include her daughter and address [place].   2. Mild-moderate MR - noted on echocardiogram.No severe enough to consider mitraClip   3. AKI on CKD stage IV - Cr bumped slightly with diuresis but not too far from baseline  4. VT - quiescent. Drop amio to 200 bid  5. Paroxysmal atrial fibrillation - appears NSR on EKG. - increase eliquis to 5 bid unles weight < 60 kg  HF will s/o.    Glori Bickers MD 01/11/2019, 3:15 PM  Advanced Heart Failure Team Pager [phone] (M-F; 7a - 4p)  Please contact [facility] for night-coverage after hours (4p -7a ) and weekends on [email or web address]

## 2019-01-11 NOTE — Progress Notes (Signed)
Physical Therapy Treatment Patient Details Name: Stephanie Yates MRN: [id number] DOB: April 28, 1946 Today's Date: 01/11/2019    History of Present Illness 73 yo admitted with acute on chronic CHF. PMhx: HTN, HLD, CKD, Afib, NICM    PT Comments    Pt pleasant and reports feeling better today. Pt with increased activity tolerance with use of RW and able to complete stairs and functional mobility. Pt educated for continued walking program and HEP to maximize strength and independence.   HR 79 during gai with SpO2 94% on RA    Follow Up Recommendations  Home health PT     Equipment Recommendations  Rolling walker with 5" wheels    Recommendations for Other Services       Precautions / Restrictions Precautions Precautions: Fall    Mobility  Bed Mobility Overal bed mobility: Modified Independent             General bed mobility comments: pt able to transition supine to sit without assist  Transfers Overall transfer level: Modified independent               General transfer comment: mod I from bed and toilet  Ambulation/Gait Ambulation/Gait assistance: Supervision Gait Distance (Feet): 300 Feet Assistive device: Rolling walker (2 wheeled) Gait Pattern/deviations: Step-through pattern;Decreased stride length;Narrow base of support   Gait velocity interpretation: >2.62 ft/sec, indicative of community ambulatory General Gait Details: pt agreeable to use of Rw this session with noted increase in activity tolerance and gait distance. Cues for proximity to RW   Stairs Stairs: Yes Stairs assistance: Modified independent (Device/Increase time) Stair Management: One rail Right;Step to pattern;Forwards Number of Stairs: 2 General stair comments: pt able to perform stairs safely with use of rail   Wheelchair Mobility    Modified Rankin (Stroke Patients Only)       Balance Overall balance assessment: Needs assistance Sitting-balance support: No upper extremity  supported;Feet supported Sitting balance-Leahy Scale: Good     Standing balance support: No upper extremity supported;During functional activity Standing balance-Leahy Scale: Good Standing balance comment: pt able to stand and move in room without UE support, use of RW for gait                            Cognition   Behavior During Therapy: [facility] for tasks assessed/performed Overall Cognitive Status: Within Functional Limits for tasks assessed                                        Exercises General Exercises - Lower Extremity Long Arc Quad: AROM;Both;Seated;20 reps Hip Flexion/Marching: AROM;Both;Seated;20 reps    General Comments        Pertinent Vitals/Pain Pain Assessment: No/denies pain    Home Living                      Prior Function            PT Goals (current goals can now be found in the care plan section) Progress towards PT goals: Progressing toward goals    Frequency           PT Plan Current plan remains appropriate    Co-evaluation              AM-PAC PT "6 Clicks" Mobility   Outcome Measure  Help needed turning from your back to  your side while in a flat bed without using bedrails?: None Help needed moving from lying on your back to sitting on the side of a flat bed without using bedrails?: None Help needed moving to and from a bed to a chair (including a wheelchair)?: None Help needed standing up from a chair using your arms (e.g., wheelchair or bedside chair)?: None Help needed to walk in hospital room?: A Little Help needed climbing 3-5 steps with a railing? : None 6 Click Score: 23    End of Session Equipment Utilized During Treatment: Gait belt Activity Tolerance: Patient tolerated treatment well Patient left: in chair;with call bell/phone within reach Nurse Communication: Mobility status PT Visit Diagnosis: Other abnormalities of gait and mobility (R26.89);Muscle weakness (generalized)  (M62.81)     Time: 0258-5277 PT Time Calculation (min) (ACUTE ONLY): 23 min  Charges:  $Gait Training: 8-22 mins $Therapeutic Exercise: 8-22 mins                     Mahsa Hanser Pam Drown, PT Acute Rehabilitation Services Pager: [phone] Office: [place] 01/11/2019, 1:26 PM

## 2019-01-12 DIAGNOSIS — I4891 Unspecified atrial fibrillation: Secondary | ICD-10-CM

## 2019-01-12 DIAGNOSIS — N179 Acute kidney failure, unspecified: Secondary | ICD-10-CM

## 2019-01-12 LAB — URINALYSIS, ROUTINE W REFLEX MICROSCOPIC
Bilirubin Urine: NEGATIVE
Glucose, UA: NEGATIVE mg/dL
Ketones, ur: NEGATIVE mg/dL
Leukocytes,Ua: NEGATIVE
Nitrite: NEGATIVE
Protein, ur: 30 mg/dL — AB
RBC / HPF: >50 RBC/hpf — ABNORMAL HIGH (ref 0–5)
Specific Gravity, Urine: 1.014 (ref 1.005–1.030)
pH: 5 (ref 5.0–8.0)

## 2019-01-12 LAB — BASIC METABOLIC PANEL
Anion gap: 9 (ref 5–15)
BUN: 39 mg/dL — ABNORMAL HIGH (ref 8–23)
CO2: 23 mmol/L (ref 22–32)
Calcium: 8.9 mg/dL (ref 8.9–10.3)
Chloride: 104 mmol/L (ref 98–111)
Creatinine, Ser: 2.24 mg/dL — ABNORMAL HIGH (ref 0.44–1.00)
GFR calc Af Amer: 24 mL/min — ABNORMAL LOW (ref >60)
GFR calc non Af Amer: 21 mL/min — ABNORMAL LOW (ref >60)
Glucose, Bld: 88 mg/dL (ref 70–99)
Potassium: 3.8 mmol/L (ref 3.5–5.1)
Sodium: 136 mmol/L (ref 135–145)

## 2019-01-12 MED ORDER — METOLAZONE 2.5 MG PO TABS: 2.5000 mg | ORAL_TABLET | Freq: Every day | ORAL | 1 refills | Status: DC | PRN

## 2019-01-12 MED ORDER — LEVALBUTEROL TARTRATE 45 MCG/ACT IN AERO: 1.0000 | INHALATION_SPRAY | RESPIRATORY_TRACT | 2 refills | Status: DC | PRN

## 2019-01-12 MED ORDER — AMIODARONE HCL 200 MG PO TABS: 400.0000 mg | ORAL_TABLET | Freq: Two times a day (BID) | ORAL | 1 refills | Status: DC

## 2019-01-12 MED ORDER — NITROGLYCERIN 0.4 MG SL SUBL: 0.4000 mg | SUBLINGUAL_TABLET | SUBLINGUAL | 12 refills | Status: DC | PRN

## 2019-01-12 MED ORDER — HYDRALAZINE HCL 25 MG PO TABS: 12.5000 mg | ORAL_TABLET | Freq: Three times a day (TID) | ORAL | 1 refills | Status: DC

## 2019-01-12 MED ORDER — APIXABAN 5 MG PO TABS: 5.0000 mg | ORAL_TABLET | Freq: Two times a day (BID) | ORAL | 2 refills | Status: DC

## 2019-01-12 MED ORDER — ISOSORBIDE MONONITRATE ER 30 MG PO TB24: 15.0000 mg | ORAL_TABLET | Freq: Every day | ORAL | 1 refills | Status: DC

## 2019-01-12 NOTE — Discharge Summary (Signed)
Physician Discharge Summary  Stephanie Yates BWI:[id number] DOB: 16-Sep-1945 DOA: 01/05/2019  PCP: Josetta Huddle, MD  Admit date: 01/05/2019 Discharge date: 01/12/2019 Consultations: Cardiology and nephrology Admitted From: Home Disposition: Home  Discharge Diagnoses:  Principal Problem:   Acute on chronic combined systolic and diastolic CHF (congestive heart failure) ([place]) Active Problems:   Hypertension   Nonischemic cardiomyopathy ([place])   Hypokalemia   History of tobacco abuse   CKD (chronic kidney disease), stage III ([place])   Chest pain   Rapid atrial fibrillation [facility])   Palliative care encounter   Hospital Course Summary: 73 y.o.femalewith medical history significant ofatrial fibrillation, systolic dysfunction CHF with EF of 25%, tobacco abuse,CKD III,hyperlipidemia and hypertension. She presented secondary to worsening dyspnea and chest discomfort concerning for acute on chronic heart failure. She was started on IV diuresis upon admission and later transitioned to oral Lasix 80 mg daily (home dose) as her condition improved.  Repeat echo in this hospitalization did show worsening of EF to 15%.  Patient was evaluated by cardiology who recommended outpatient follow-up with [facility] HF program with REDS monitoring.  Patient's hospital course was complicated by AKI in the setting of diuresis.  She does have stage III CKD at baseline with creatinine around 1.7-1.9.   Patient was evaluated by nephrology and underwent renal ultrasonogram that showed 4 mm nonobstructing lower pole right kidney stone for which she is recommended to follow-up urology as outpatient.  UA did not show any proteinuria but did show some hematuria likely related to the nonobstructing stone.  Patient's creatinine on presentation was 2.4 and fluctuated between 1.6-2.2 prior to discharge.  At this point some degree of renal dysfunction acceptable in the setting of severely impaired cardiac output with low EF and CHF  requiring diuretics.   Cardiology also recommended discharge on metolazone for as needed use in addition to scheduled Lasix and to continue Coreg/hydralazine/nitrates.  Patient not being discharged on ACE inhibitors due to AKI.  Patient was previously prescribed amiodarone taper for rapid A. fib/NSVT and per cardiology recommendation home dosage would be changed to 200 mg twice daily for 2 weeks subsequent to which she can likely go down to once daily.  She was also on suboptimal Eliquis dosing which has been increased to 5 mg twice daily now.  She does have poor prognosis due to EF of 15%.Unfortunately given debility and renal failure, cardiology did not think she is a candidate for advanced therapies. (I.e VAD). Also with recurrent VT, did not recommend IV inotropes.  Upon detailed discussion with cardiologist about her situation, she said clearly that she would not want Hospice at this point but also would never want to be on a ventilator (CPR ok). She has agreed to Palliative care visit to include her daughter and address [place].  Palliative care did see patient prior to discharge and recommended outpatient follow-up.   Discharge Exam:  Vitals:   01/12/19 0832 01/12/19 1130  BP: 116/66 117/78  Pulse: 77 70  Resp:  16  Temp:  (!) 97.4 F (36.3 C)  SpO2:  95%   Vitals:   01/12/19 0253 01/12/19 0540 01/12/19 0832 01/12/19 1130  BP:  (!) 115/59 116/66 117/78  Pulse:  70 77 70  Resp:  18  16  Temp:  97.9 F (36.6 C)  (!) 97.4 F (36.3 C)  TempSrc:  Oral  Oral  SpO2:  93%  95%  Weight: 61 kg     Height:  General: Pt is alert, awake, not in acute distress Cardiovascular: Irregular, S1/S2 +, no rubs, no gallops Respiratory: CTA bilaterally, no wheezing, no rhonchi Abdominal: Soft, NT, ND, bowel sounds + Extremities: Trace edema, no cyanosis  Discharge Condition:Stable CODE STATUS: No intubation, okay for CPR Diet recommendation: 2 g sodium diet Recommendations for Outpatient  Follow-up:  1. Follow up with PCP: 1 week 2. Follow up with consultants: Heart failure clinic as scheduled, Palliative care 3. Please obtain follow up work-up including: BMP in 1 week, amiodarone dosage adjustment in 2 weeks  Home Health services upon discharge: Patient declined both home health and DME as recommended by PT.  She states her daughter lives close by and will support patient as needed    Discharge Instructions:  Discharge Instructions    ([place]) Call MD:  Anytime you have any of the following symptoms: 1) 3 pound weight gain in 24 hours or 5 pounds in 1 week 2) shortness of breath, with or without a dry hacking cough 3) swelling in the hands, feet or stomach 4) if you have to sleep on extra pillows at night in order to breathe.   Complete by: As directed    AMB referral to CHF clinic   Complete by: As directed    Avoid straining   Complete by: As directed    Call MD for:  difficulty breathing, headache or visual disturbances   Complete by: As directed    Call MD for:  extreme fatigue   Complete by: As directed    Call MD for:  persistant dizziness or light-headedness   Complete by: As directed    Diet - low sodium heart healthy   Complete by: As directed    Discharge instructions   Complete by: As directed    Call PCP office for follow up visit next week and BMP/lab check   Heart Failure patients record your daily weight using the same scale at the same time of day   Complete by: As directed    Increase activity slowly   Complete by: As directed    STOP any activity that causes chest pain, shortness of breath, dizziness, sweating, or exessive weakness   Complete by: As directed      Allergies as of 01/12/2019      Reactions   Strawberry Extract Hives, Itching, Swelling, Other (See Comments)   Hives and itching      Medication List    STOP taking these medications   acetaminophen 500 MG tablet Commonly known as: TYLENOL   oseltamivir 30 MG  capsule Commonly known as: TAMIFLU   potassium chloride SA 20 MEQ tablet Commonly known as: K-DUR   predniSONE 10 MG tablet Commonly known as: DELTASONE     TAKE these medications   amiodarone 200 MG tablet Commonly known as: PACERONE Take 2 tablets (400 mg total) by mouth 2 (two) times daily for 14 days. Change to once daily after 2 weeks What changed:   additional instructions  Another medication with the same name was removed. Continue taking this medication, and follow the directions you see here.   apixaban 5 MG Tabs tablet Commonly known as: Eliquis Take 1 tablet (5 mg total) by mouth 2 (two) times daily. What changed:   medication strength  how much to take   atorvastatin 40 MG tablet Commonly known as: LIPITOR Take 1 tablet (40 mg total) by mouth daily at 6 PM.   carvedilol 3.125 MG tablet Commonly known as: COREG  Take 1 tablet (3.125 mg total) by mouth 2 (two) times daily with a meal.   ferrous gluconate 324 MG tablet Commonly known as: FERGON Take 1 tablet by mouth 2 (two) times daily.   furosemide 80 MG tablet Commonly known as: LASIX Take 1 tablet (80 mg total) by mouth daily.   hydrALAZINE 25 MG tablet Commonly known as: APRESOLINE Take 0.5 tablets (12.5 mg total) by mouth 3 (three) times daily.   isosorbide mononitrate 30 MG 24 hr tablet Commonly known as: IMDUR Take 0.5 tablets (15 mg total) by mouth daily. Start taking on: January 13, 2019   levalbuterol 45 MCG/ACT inhaler Commonly known as: XOPENEX HFA Inhale 1 puff into the lungs every 4 (four) hours as needed for wheezing.   magnesium oxide 400 (241.3 Mg) MG tablet Commonly known as: MAG-OX Take 1 tablet (400 mg total) by mouth daily.   metolazone 2.5 MG tablet Commonly known as: ZAROXOLYN Take 1 tablet (2.5 mg total) by mouth daily as needed (leg swellings or shortness of breath or weight gain>5 pounds in 48 hours).   nitroGLYCERIN 0.4 MG SL tablet Commonly known as: NITROSTAT Place  1 tablet (0.4 mg total) under the tongue every 5 (five) minutes as needed for chest pain.   pantoprazole 40 MG tablet Commonly known as: PROTONIX Take 1 tablet (40 mg total) by mouth 2 (two) times daily before a meal.   VITAMIN D-3 PO Take 1,000 Units by mouth at bedtime.            Durable Medical Equipment  (From admission, onward)         Start     Ordered   01/09/19 1544  For home use only DME Walker rolling  Once    Comments: 5" wheels  Question:  Patient needs a walker to treat with the following condition  Answer:  Ambulatory dysfunction   01/09/19 1546         Follow-up [place]. Go on 01/24/2019.   Specialty: Cardiology Why: 11 AM, parking code [place] information: [address] [id number] [place]         Allergies  Allergen Reactions  . Strawberry Extract Hives, Itching, Swelling and Other (See Comments)    Hives and itching       The results of significant diagnostics from this hospitalization (including imaging, microbiology, ancillary and laboratory) are listed below for reference.    Labs: BNP (last 3 results) Recent Labs    09/02/18 1541 01/05/19 2213 01/10/19 0422  BNP 819.2* 1,129.5* 7,062.3*   Basic Metabolic Panel: Recent Labs  Lab 01/08/19 0739 01/09/19 0330 01/10/19 0422 01/11/19 0142 01/12/19 0807  NA 137 137 136 137 136  K 4.2 4.7 4.4 3.8 3.8  CL 106 106 105 104 104  CO2 23 23 24 23 23   GLUCOSE 86 86 88 83 88  BUN 34* 32* 26* 32* 39*  CREATININE 2.17* 1.93* 1.93* 2.14* 2.24*  CALCIUM 8.9 8.9 9.1 9.0 8.9  MG  --  2.2  --   --   --    Liver Function Tests: Recent Labs  Lab 01/05/19 2213  AST 62*  ALT 26  ALKPHOS 114  BILITOT 1.3*  PROT 7.5  ALBUMIN 3.1*   No results for input(s): LIPASE, AMYLASE in the last 168 hours. No results for input(s): AMMONIA in the last 168 hours. CBC: Recent Labs   Lab 01/05/19 2213  01/06/19 0241 01/10/19 0422  WBC 3.9* 5.5 5.8  NEUTROABS 1.8 2.3 2.6  HGB 10.8* 11.0* 11.6*  HCT 34.2* 34.6* 36.6  MCV 73.9* 74.2* 74.1*  PLT 196 210 215   Cardiac Enzymes: No results for input(s): CKTOTAL, CKMB, CKMBINDEX, TROPONINI in the last 168 hours. BNP: Invalid input(s): POCBNP CBG: No results for input(s): GLUCAP in the last 168 hours. D-Dimer No results for input(s): DDIMER in the last 72 hours. Hgb A1c No results for input(s): HGBA1C in the last 72 hours. Lipid Profile No results for input(s): CHOL, HDL, LDLCALC, TRIG, CHOLHDL, LDLDIRECT in the last 72 hours. Thyroid function studies No results for input(s): TSH, T4TOTAL, T3FREE, THYROIDAB in the last 72 hours.  Invalid input(s): FREET3 Anemia work up No results for input(s): VITAMINB12, FOLATE, FERRITIN, TIBC, IRON, RETICCTPCT in the last 72 hours. Urinalysis    Component Value Date/Time   COLORURINE YELLOW 01/12/2019 0257   APPEARANCEUR CLOUDY (A) 01/12/2019 0257   LABSPEC 1.014 01/12/2019 0257   PHURINE 5.0 01/12/2019 0257   GLUCOSEU NEGATIVE 01/12/2019 0257   HGBUR LARGE (A) 01/12/2019 0257   BILIRUBINUR NEGATIVE 01/12/2019 0257   KETONESUR NEGATIVE 01/12/2019 0257   PROTEINUR 30 (A) 01/12/2019 0257   UROBILINOGEN 1.0 08/29/2014 2035   NITRITE NEGATIVE 01/12/2019 0257   LEUKOCYTESUR NEGATIVE 01/12/2019 0257   Sepsis Labs Invalid input(s): PROCALCITONIN,  WBC,  LACTICIDVEN Microbiology Recent Results (from the past 240 hour(s))  SARS Coronavirus 2 (CEPHEID - Performed in [place] hospital lab), Hosp Order     Status: None   Collection Time: 01/05/19 10:31 PM   Specimen: Nasopharyngeal Swab  Result Value Ref Range Status   SARS Coronavirus 2 NEGATIVE NEGATIVE Final    Comment: (NOTE) If result is NEGATIVE SARS-CoV-2 target nucleic acids are NOT DETECTED. The SARS-CoV-2 RNA is generally detectable in upper and lower  respiratory specimens during the acute phase of infection.  The lowest  concentration of SARS-CoV-2 viral copies this assay can detect is 250  copies / mL. A negative result does not preclude SARS-CoV-2 infection  and should not be used as the sole basis for treatment or other  patient management decisions.  A negative result may occur with  improper specimen collection / handling, submission of specimen other  than nasopharyngeal swab, presence of viral mutation(s) within the  areas targeted by this assay, and inadequate number of viral copies  (<250 copies / mL). A negative result must be combined with clinical  observations, patient history, and epidemiological information. If result is POSITIVE SARS-CoV-2 target nucleic acids are DETECTED. The SARS-CoV-2 RNA is generally detectable in upper and lower  respiratory specimens dur ing the acute phase of infection.  Positive  results are indicative of active infection with SARS-CoV-2.  Clinical  correlation with patient history and other diagnostic information is  necessary to determine patient infection status.  Positive results do  not rule out bacterial infection or co-infection with other viruses. If result is PRESUMPTIVE POSTIVE SARS-CoV-2 nucleic acids MAY BE PRESENT.   A presumptive positive result was obtained on the submitted specimen  and confirmed on repeat testing.  While 2019 novel coronavirus  (SARS-CoV-2) nucleic acids may be present in the submitted sample  additional confirmatory testing may be necessary for epidemiological  and / or clinical management purposes  to differentiate between  SARS-CoV-2 and other Sarbecovirus currently known to infect humans.  If clinically indicated additional testing with an alternate test  methodology [phone]) is advised. The SARS-CoV-2 RNA is generally  detectable in upper and lower respiratory sp ecimens during the acute  phase of infection. The expected result is Negative. Fact Sheet for Patients:   StrictlyIdeas.no Fact Sheet for Healthcare Providers: BankingDealers.co.za This test is not yet approved or cleared by the [place] FDA and has been authorized for detection and/or diagnosis of SARS-CoV-2 by FDA under an Emergency Use Authorization (EUA).  This EUA will remain in effect (meaning this test can be used) for the duration of the COVID-19 declaration under Section 564(b)(1) of the Act, 21 U.S.C. section 360bbb-3(b)(1), unless the authorization is terminated or revoked sooner. Performed at [facility], [place] [address] [place], [place] [address]     Procedures/Studies: US Renal  Result Date: 01/11/2019 CLINICAL DATA:  Acute kidney injury. EXAM: RENAL / URINARY TRACT ULTRASOUND COMPLETE COMPARISON:  None. FINDINGS: Right Kidney: Renal measurements: 10.3 x 5.8 x 3.1 cm = volume: 97 mL . Echogenicity within normal limits. 4 mm lower pole calculus. Two small cysts. No mass or hydronephrosis. Left Kidney: Renal measurements: 7.8 x 4.5 x 4.0 cm = volume: 74 mL. Echogenicity within normal limits. Two small cysts. No mass or hydronephrosis visualized. Bladder: Appears normal for degree of bladder distention. IMPRESSION: 1. Mild left renal atrophy. 2. No hydronephrosis or renal echotexture abnormality. 3. 4 mm nonobstructing lower pole right renal calculus. Electronically Signed   By: Claudie Revering M.D.   On: 01/11/2019 17:34   Dg Chest Port 1 View  Result Date: 01/07/2019 CLINICAL DATA:  Short of breath.  History of CHF. EXAM: PORTABLE CHEST 1 VIEW COMPARISON:  01/05/2019 and older exams. FINDINGS: Mild to moderate enlargement of the cardiopericardial silhouette. No mediastinal or hilar masses. No convincing adenopathy. Prominent bronchovascular markings with additional lung base reticular opacities chronic and similar to prior exams. No evidence of pneumonia or pulmonary edema. No pleural effusion or pneumothorax. Left anterior chest  wall sequential pacemaker is stable. Skeletal structures are grossly intact. IMPRESSION: 1. No acute cardiopulmonary disease. 2. Stable mild-to-moderate cardiomegaly. Electronically Signed   By: Lajean Manes M.D.   On: 01/07/2019 08:27   Dg Chest Port 1 View  Result Date: 01/05/2019 CLINICAL DATA:  Shortness of breath and chest pain EXAM: PORTABLE CHEST 1 VIEW COMPARISON:  09/02/2018 FINDINGS: Cardiac shadow is enlarged. Defibrillator is again noted and stable. Aortic calcifications are seen. Vascular congestion is noted with interstitial edema. No sizable effusion is noted. IMPRESSION: Changes of mild CHF. Electronically Signed   By: Inez Catalina M.D.   On: 01/05/2019 22:06    Time coordinating discharge: Over 30 minutes  SIGNED:   Guilford Shi, MD  [facility] 01/12/2019, 1:04 PM Pager : [phone]

## 2019-01-12 NOTE — Progress Notes (Signed)
[facility]    NEPHROLOGY PROGRESS NOTE  SUBJECTIVE: Patient seen and examined.  Denies any chest pain, shortness of breath, nausea, vomiting, diarrhea or dysuria.  Is ambulate to ambulate in the room.  All other review of systems are negative.   OBJECTIVE:  Vitals:   01/12/19 0832 01/12/19 1130  BP: 116/66 117/78  Pulse: 77 70  Resp:  16  Temp:  (!) 97.4 F (36.3 C)  SpO2:  95%    Intake/Output Summary (Last 24 hours) at 01/12/2019 1222 Last data filed at 01/12/2019 1132 Gross per 24 hour  Intake 360 ml  Output 1300 ml  Net -940 ml      General:  AAOx3 NAD HEENT: MMM [place] AT anicteric sclera Neck: Minimal JVD, no adenopathy CV:  Heart RRR  Lungs:  L/S CTA bilaterally Abd:  abd SNT/ND with normal BS GU:  Bladder non-palpable Extremities:  No LE edema. Skin:  No skin rash  MEDICATIONS:  . amiodarone  200 mg Oral BID  . apixaban  5 mg Oral BID  . atorvastatin  40 mg Oral q1800  . carvedilol  3.125 mg Oral BID WC  . cholecalciferol  1,000 Units Oral QHS  . dextromethorphan-guaiFENesin  2 tablet Oral BID  . ferrous gluconate  324 mg Oral BID  . furosemide  80 mg Oral Daily  . hydrALAZINE  12.5 mg Oral TID  . isosorbide mononitrate  15 mg Oral Daily  . magnesium oxide  400 mg Oral Daily  . pantoprazole  40 mg Oral BID AC  . sodium chloride flush  3 mL Intravenous Q12H       LABS:   CBC Latest Ref Rng & Units 01/10/2019 01/06/2019 01/05/2019  WBC 4.0 - 10.5 K/uL 5.8 5.5 3.9(L)  Hemoglobin 12.0 - 15.0 g/dL 11.6(L) 11.0(L) 10.8(L)  Hematocrit 36.0 - 46.0 % 36.6 34.6(L) 34.2(L)  Platelets 150 - 400 K/uL 215 210 196    CMP Latest Ref Rng & Units 01/12/2019 01/11/2019 01/10/2019  Glucose 70 - 99 mg/dL 88 83 88  BUN 8 - 23 mg/dL 39(H) 32(H) 26(H)  Creatinine 0.44 - 1.00 mg/dL 2.24(H) 2.14(H) 1.93(H)  Sodium 135 - 145 mmol/L 136 137 136  Potassium 3.5 - 5.1 mmol/L 3.8 3.8 4.4  Chloride 98 - 111 mmol/L 104 104 105  CO2 22 - 32 mmol/L 23 23 24   Calcium  8.9 - 10.3 mg/dL 8.9 9.0 9.1  Total Protein 6.5 - 8.1 g/dL - - -  Total Bilirubin 0.3 - 1.2 mg/dL - - -  Alkaline Phos 38 - 126 U/L - - -  AST 15 - 41 U/L - - -  ALT 0 - 44 U/L - - -    Lab Results  Component Value Date   CALCIUM 8.9 01/12/2019   PHOS 2.6 09/02/2018       Component Value Date/Time   COLORURINE YELLOW 01/12/2019 0257   APPEARANCEUR CLOUDY (A) 01/12/2019 0257   LABSPEC 1.014 01/12/2019 0257   PHURINE 5.0 01/12/2019 0257   GLUCOSEU NEGATIVE 01/12/2019 0257   HGBUR LARGE (A) 01/12/2019 0257   BILIRUBINUR NEGATIVE 01/12/2019 0257   KETONESUR NEGATIVE 01/12/2019 0257   PROTEINUR 30 (A) 01/12/2019 0257   UROBILINOGEN 1.0 08/29/2014 2035   NITRITE NEGATIVE 01/12/2019 0257   LEUKOCYTESUR NEGATIVE 01/12/2019 0257      Component Value Date/Time   PHART 7.375 12/17/2012 1805   PCO2ART 45.4 (H) 12/17/2012 1805   PO2ART 110.0 (H) 12/17/2012 1805   HCO3 26.6 (H) 12/17/2012  1805   TCO2 28 12/17/2012 1805   O2SAT 98.0 12/17/2012 1805       Component Value Date/Time   IRON 22 (L) 06/15/2017 1902   TIBC 470 (H) 06/15/2017 1902   FERRITIN 15 06/17/2017 1447   IRONPCTSAT 5 (L) 06/15/2017 1902       ASSESSMENT/PLAN:    73 year old female patient with a past medical history significant for chronic kidney disease stage III with baseline serum creatinine around 1.7-1.9 Who presents with acute on chronic combined systolic and diastolic congestive heart failure.  1.  Chronic kidney disease stage III.  Her baseline serum creatinine runs around 1.7-1.9. Likely secondary to long-standing hypertension.    Urinalysis with no significant proteinuria.  Did have some hematuria.  4 mm nonobstructing lower pole right stone was noted on ultrasound.  Can follow-up with urology as an outpatient.   2.  Acute kidney injury.  In the setting of diuresis.  I suspect this is related to diuretics.  Appears to be relatively euvolemic today.  Is oxygenating well on room air.  Blood pressures are  stable.  Agree with by mouth diuretics.  No indication for dialysis.  3.  Acute on chronic systolic and diastolic congestive heart failure.  ACE inhibitor on hold in the setting of acute kidney injury.  Continue  Diuresis.  Continue carvedilol, hydralazine, and imdur.  Cardiology is following.  4.  Anemia of chronic kidney disease.  Hemoglobin is above goal.  5.  Acid-base.  Bicarbonate is stable.  6.  Hypertension.  Blood pressure control is excellent.  7.  Metabolic bone disease.  Check intact PTH and phosphorus.    [place], DO, [place]

## 2019-01-12 NOTE — Progress Notes (Signed)
discharge information and medication education given. Peripheral iv removed, clean dry and intact, pressure and dressing applied. Patient's belongings with pt: clothes, shoes, cell phone and charger. Patient's questions and concerns answered.  Nurse tech transporting patient in wheelchair to [place] entrance where patient's daughter will pick pt up.

## 2019-01-14 ENCOUNTER — Telehealth: Payer: Self-pay

## 2019-01-14 DIAGNOSIS — I472 Ventricular tachycardia, unspecified: Secondary | ICD-10-CM

## 2019-01-14 NOTE — Telephone Encounter (Signed)
Spoke to pt regarding VT episodes. VT-1 01/13/19 @ 2054 (24 second duration), max V rate 150bpm, ATP x1 successfully terminated. VT-1 on 01/12/19 @ 2143 (26 second duration), Max V rate 143. ATP x2 successfully terminated. Pt states she only remembers having SOB during the 01/13/19 episode, denies missing any medication doses. Informed pt on [place] driving restrictions. Pt verbalized understanding. Will route to Dr. Caryl Comes for review.

## 2019-01-14 NOTE — Telephone Encounter (Signed)
Unfortunate Lets continue her on amio 400 bid, and add ranolazine 500 bid ( have her check the cost) I am glad that the episodes have pace terminated  M Thx SK

## 2019-01-16 MED ORDER — RANOLAZINE ER 500 MG PO TB12: 500.0000 mg | ORAL_TABLET | Freq: Two times a day (BID) | ORAL | Status: DC

## 2019-01-16 NOTE — Telephone Encounter (Signed)
7 ATP terminated VT events on 7/21, rates 139-148 bpm. 31 NSVT events during this time. Presenting rhythm is AP/VP. Pt denies symptoms, denies missing medication doses. Instructed pt to continue on amio 400 BID and will add ranolazine 500 BID per Dr. Caryl Comes. Informed pt on [place] driving restrictions. Pt verbalizes understanding, no questions at this time. Will route to Dr. Caryl Comes for review.

## 2019-01-17 ENCOUNTER — Other Ambulatory Visit: Payer: Self-pay | Admitting: Internal Medicine

## 2019-01-17 ENCOUNTER — Other Ambulatory Visit: Payer: Self-pay

## 2019-01-17 ENCOUNTER — Inpatient Hospital Stay (HOSPITAL_COMMUNITY)
Admission: EM | Admit: 2019-01-17 | Discharge: 2019-01-24 | DRG: 309 | Disposition: A | Discharge status: Home / Self Care | Payer: Medicare Other | Attending: Internal Medicine | Admitting: Internal Medicine

## 2019-01-17 DIAGNOSIS — I340 Nonrheumatic mitral (valve) insufficiency: Secondary | ICD-10-CM | POA: Diagnosis present

## 2019-01-17 DIAGNOSIS — I480 Paroxysmal atrial fibrillation: Secondary | ICD-10-CM | POA: Diagnosis present

## 2019-01-17 DIAGNOSIS — N184 Chronic kidney disease, stage 4 (severe): Secondary | ICD-10-CM | POA: Diagnosis present

## 2019-01-17 DIAGNOSIS — Z7901 Long term (current) use of anticoagulants: Secondary | ICD-10-CM | POA: Diagnosis not present

## 2019-01-17 DIAGNOSIS — R1012 Left upper quadrant pain: Secondary | ICD-10-CM | POA: Diagnosis not present

## 2019-01-17 DIAGNOSIS — I5022 Chronic systolic (congestive) heart failure: Secondary | ICD-10-CM | POA: Diagnosis present

## 2019-01-17 DIAGNOSIS — Z79899 Other long term (current) drug therapy: Secondary | ICD-10-CM

## 2019-01-17 DIAGNOSIS — I130 Hypertensive heart and chronic kidney disease with heart failure and stage 1 through stage 4 chronic kidney disease, or unspecified chronic kidney disease: Secondary | ICD-10-CM | POA: Diagnosis present

## 2019-01-17 DIAGNOSIS — I472 Ventricular tachycardia, unspecified: Secondary | ICD-10-CM

## 2019-01-17 DIAGNOSIS — D649 Anemia, unspecified: Secondary | ICD-10-CM | POA: Diagnosis present

## 2019-01-17 DIAGNOSIS — Z809 Family history of malignant neoplasm, unspecified: Secondary | ICD-10-CM

## 2019-01-17 DIAGNOSIS — Z87891 Personal history of nicotine dependence: Secondary | ICD-10-CM | POA: Diagnosis not present

## 2019-01-17 DIAGNOSIS — Z8711 Personal history of peptic ulcer disease: Secondary | ICD-10-CM | POA: Diagnosis not present

## 2019-01-17 DIAGNOSIS — I428 Other cardiomyopathies: Secondary | ICD-10-CM | POA: Diagnosis present

## 2019-01-17 DIAGNOSIS — Z20828 Contact with and (suspected) exposure to other viral communicable diseases: Secondary | ICD-10-CM | POA: Diagnosis present

## 2019-01-17 DIAGNOSIS — I440 Atrioventricular block, first degree: Secondary | ICD-10-CM | POA: Diagnosis present

## 2019-01-17 DIAGNOSIS — Z95810 Presence of automatic (implantable) cardiac defibrillator: Secondary | ICD-10-CM

## 2019-01-17 DIAGNOSIS — R1013 Epigastric pain: Secondary | ICD-10-CM | POA: Diagnosis not present

## 2019-01-17 DIAGNOSIS — G4700 Insomnia, unspecified: Secondary | ICD-10-CM | POA: Diagnosis present

## 2019-01-17 DIAGNOSIS — I4729 Other ventricular tachycardia: Secondary | ICD-10-CM

## 2019-01-17 DIAGNOSIS — E785 Hyperlipidemia, unspecified: Secondary | ICD-10-CM | POA: Diagnosis present

## 2019-01-17 LAB — CBC WITH DIFFERENTIAL/PLATELET
Abs Immature Granulocytes: 0.02 10*3/uL (ref 0.00–0.07)
Basophils Absolute: 0.1 10*3/uL (ref 0.0–0.1)
Basophils Relative: 2 %
Eosinophils Absolute: 0.2 10*3/uL (ref 0.0–0.5)
Eosinophils Relative: 4 %
HCT: 36.8 % (ref 36.0–46.0)
Hemoglobin: 11.6 g/dL — ABNORMAL LOW (ref 12.0–15.0)
Immature Granulocytes: 1 %
Lymphocytes Relative: 33 %
Lymphs Abs: 1.4 10*3/uL (ref 0.7–4.0)
MCH: 23.5 pg — ABNORMAL LOW (ref 26.0–34.0)
MCHC: 31.5 g/dL (ref 30.0–36.0)
MCV: 74.6 fL — ABNORMAL LOW (ref 80.0–100.0)
Monocytes Absolute: 0.7 10*3/uL (ref 0.1–1.0)
Monocytes Relative: 15 %
Neutro Abs: 1.9 10*3/uL (ref 1.7–7.7)
Neutrophils Relative %: 45 %
Platelets: 233 10*3/uL (ref 150–400)
RBC: 4.93 MIL/uL (ref 3.87–5.11)
RDW: 21.3 % — ABNORMAL HIGH (ref 11.5–15.5)
WBC: 4.2 10*3/uL (ref 4.0–10.5)
nRBC: 0 % (ref 0.0–0.2)

## 2019-01-17 LAB — URINALYSIS, ROUTINE W REFLEX MICROSCOPIC
Bilirubin Urine: NEGATIVE
Glucose, UA: NEGATIVE mg/dL
Ketones, ur: NEGATIVE mg/dL
Leukocytes,Ua: NEGATIVE
Nitrite: NEGATIVE
Protein, ur: NEGATIVE mg/dL
RBC / HPF: >50 RBC/hpf — ABNORMAL HIGH (ref 0–5)
Specific Gravity, Urine: 1.01 (ref 1.005–1.030)
pH: 6 (ref 5.0–8.0)

## 2019-01-17 LAB — SARS CORONAVIRUS 2 BY RT PCR (HOSPITAL ORDER, PERFORMED IN ~~LOC~~ HOSPITAL LAB): SARS Coronavirus 2: NEGATIVE

## 2019-01-17 LAB — PHOSPHORUS: Phosphorus: 3.3 mg/dL (ref 2.5–4.6)

## 2019-01-17 LAB — BASIC METABOLIC PANEL
Anion gap: 9 (ref 5–15)
BUN: 38 mg/dL — ABNORMAL HIGH (ref 8–23)
CO2: 23 mmol/L (ref 22–32)
Calcium: 9.2 mg/dL (ref 8.9–10.3)
Chloride: 107 mmol/L (ref 98–111)
Creatinine, Ser: 1.82 mg/dL — ABNORMAL HIGH (ref 0.44–1.00)
GFR calc Af Amer: 31 mL/min — ABNORMAL LOW (ref >60)
GFR calc non Af Amer: 27 mL/min — ABNORMAL LOW (ref >60)
Glucose, Bld: 111 mg/dL — ABNORMAL HIGH (ref 70–99)
Potassium: 3.8 mmol/L (ref 3.5–5.1)
Sodium: 139 mmol/L (ref 135–145)

## 2019-01-17 LAB — MRSA PCR SCREENING: MRSA by PCR: NEGATIVE

## 2019-01-17 LAB — MAGNESIUM: Magnesium: 2.2 mg/dL (ref 1.7–2.4)

## 2019-01-17 MED ORDER — FUROSEMIDE 80 MG PO TABS
80.0000 mg | ORAL_TABLET | Freq: Every day | ORAL | Status: DC
  Administered 2019-01-17 – 2019-01-24 (×8): 80 mg via ORAL
  Filled 2019-01-17 (×3): qty 1
  Filled 2019-01-17: qty 4
  Filled 2019-01-17 (×3): qty 1
  Filled 2019-01-17: qty 4

## 2019-01-17 MED ORDER — AMIODARONE HCL IN DEXTROSE 360-4.14 MG/200ML-% IV SOLN
60.0000 mg/h | INTRAVENOUS | Status: AC
  Administered 2019-01-17 (×2): 60 mg/h via INTRAVENOUS
  Filled 2019-01-17 (×2): qty 200

## 2019-01-17 MED ORDER — LEVALBUTEROL TARTRATE 45 MCG/ACT IN AERO: 1.0000 | INHALATION_SPRAY | RESPIRATORY_TRACT | Status: DC | PRN

## 2019-01-17 MED ORDER — AMIODARONE HCL IN DEXTROSE 360-4.14 MG/200ML-% IV SOLN
60.0000 mg/h | INTRAVENOUS | Status: DC
  Administered 2019-01-17 – 2019-01-18 (×3): 60 mg/h via INTRAVENOUS
  Filled 2019-01-17 (×2): qty 200

## 2019-01-17 MED ORDER — RANOLAZINE ER 500 MG PO TB12: 500.0000 mg | ORAL_TABLET | Freq: Two times a day (BID) | ORAL | 0 refills | Status: DC

## 2019-01-17 MED ORDER — ATORVASTATIN CALCIUM 40 MG PO TABS
40.0000 mg | ORAL_TABLET | Freq: Every day | ORAL | Status: DC
  Administered 2019-01-17 – 2019-01-23 (×7): 40 mg via ORAL
  Filled 2019-01-17 (×7): qty 1

## 2019-01-17 MED ORDER — NITROGLYCERIN 0.4 MG SL SUBL: 0.4000 mg | SUBLINGUAL_TABLET | SUBLINGUAL | Status: DC | PRN

## 2019-01-17 MED ORDER — RANOLAZINE ER 500 MG PO TB12: 500.0000 mg | ORAL_TABLET | Freq: Two times a day (BID) | ORAL | Status: DC

## 2019-01-17 MED ORDER — ACETAMINOPHEN 325 MG PO TABS: 650.0000 mg | ORAL_TABLET | ORAL | Status: DC | PRN

## 2019-01-17 MED ORDER — PANTOPRAZOLE SODIUM 40 MG PO TBEC
40.0000 mg | DELAYED_RELEASE_TABLET | Freq: Two times a day (BID) | ORAL | Status: DC
  Administered 2019-01-17 – 2019-01-21 (×9): 40 mg via ORAL
  Filled 2019-01-17 (×9): qty 1

## 2019-01-17 MED ORDER — ZOLPIDEM TARTRATE 5 MG PO TABS
5.0000 mg | ORAL_TABLET | Freq: Once | ORAL | Status: AC
  Administered 2019-01-18: 5 mg via ORAL
  Filled 2019-01-17: qty 1

## 2019-01-17 MED ORDER — CARVEDILOL 3.125 MG PO TABS
3.1250 mg | ORAL_TABLET | Freq: Two times a day (BID) | ORAL | Status: DC
  Administered 2019-01-17 – 2019-01-24 (×13): 3.125 mg via ORAL
  Filled 2019-01-17 (×13): qty 1

## 2019-01-17 MED ORDER — HYDRALAZINE HCL 25 MG PO TABS
12.5000 mg | ORAL_TABLET | Freq: Three times a day (TID) | ORAL | Status: DC
  Administered 2019-01-17 – 2019-01-24 (×20): 12.5 mg via ORAL
  Filled 2019-01-17 (×21): qty 1

## 2019-01-17 MED ORDER — MAGNESIUM OXIDE 400 (241.3 MG) MG PO TABS
400.0000 mg | ORAL_TABLET | Freq: Every day | ORAL | Status: DC
  Administered 2019-01-17 – 2019-01-24 (×8): 400 mg via ORAL
  Filled 2019-01-17 (×9): qty 1

## 2019-01-17 MED ORDER — APIXABAN 5 MG PO TABS
5.0000 mg | ORAL_TABLET | Freq: Two times a day (BID) | ORAL | Status: DC
  Administered 2019-01-17 – 2019-01-21 (×8): 5 mg via ORAL
  Filled 2019-01-17 (×8): qty 1

## 2019-01-17 MED ORDER — ISOSORBIDE MONONITRATE ER 30 MG PO TB24
15.0000 mg | ORAL_TABLET | Freq: Every day | ORAL | Status: DC
  Administered 2019-01-17 – 2019-01-24 (×8): 15 mg via ORAL
  Filled 2019-01-17 (×8): qty 1

## 2019-01-17 MED ORDER — LEVALBUTEROL HCL 1.25 MG/0.5ML IN NEBU: 1.2500 mg | INHALATION_SOLUTION | Freq: Four times a day (QID) | RESPIRATORY_TRACT | Status: DC | PRN

## 2019-01-17 MED ORDER — FERROUS GLUCONATE 324 (38 FE) MG PO TABS
324.0000 mg | ORAL_TABLET | Freq: Two times a day (BID) | ORAL | Status: DC
  Administered 2019-01-17 – 2019-01-24 (×14): 324 mg via ORAL
  Filled 2019-01-17 (×16): qty 1

## 2019-01-17 NOTE — ED Notes (Signed)
ED TO INPATIENT HANDOFF REPORT  ED Nurse Name and Phone #:    S Name/Age/Gender Stephanie Yates 73 y.o. female Room/Bed: 025C/025C  Code Status   Code Status: Full Code  Home/SNF/Other Home Patient oriented to: self, place, time and situation Is this baseline? Yes   Triage Complete: Triage complete  Chief Complaint defib fired  Triage Note Pt arrives from home stating that the dr. Gabriel Carina telling her to go to hospital. Pacemaker fired yesterday & dr. Rosanna Randy her to be seen. Pt is asymptomatic. Pts pacemaker started pacing her again during the EMS ride. Pt A&O x4     Allergies Allergies  Allergen Reactions  . Strawberry Extract Hives, Itching, Swelling and Other (See Comments)    Hives and itching     Level of Care/Admitting Diagnosis ED Disposition    ED Disposition Condition [facility] Area: [place] [100100]  Level of Care: ICU [6]  Covid Evaluation: Asymptomatic Screening Protocol (No Symptoms)  Diagnosis: VT (ventricular tachycardia) [facility]) [170017]  Admitting Physician: Cleatis Polka  Attending Physician: Deboraha Sprang [phone]  Estimated length of stay: past midnight tomorrow  Certification:: I certify this patient will need inpatient services for at least 2 midnights  Bed request comments: cardiac ICU, 2Heart  PT Class (Do Not Modify): Inpatient [101]  PT Acc Code (Do Not Modify): Private [1]       B Medical/Surgery History Past Medical History:  Diagnosis Date  . Chronic anemia   . Chronic systolic CHF (congestive heart failure) ([place])   . CKD (chronic kidney disease), stage IV ([place])   . Former tobacco use   . History of tobacco abuse   . Hyperlipidemia   . Hypertension   . Mitral regurgitation   . Nonischemic cardiomyopathy ([place])    a. EF 10-15% on 11/2012 cath with no significant CAD. b. EF 25% in 2019. b. EF 15% in 12/2018  . PAF (paroxysmal atrial fibrillation) ([place])   . Protein calorie malnutrition  ([place])   . Quit consuming alcohol in remote past   . Sinus bradycardia 12/21/2012  . Ventricular tachycardia ([place]) 12/14/2012   Past Surgical History:  Procedure Laterality Date  . ESOPHAGOGASTRODUODENOSCOPY (EGD) WITH PROPOFOL N/A 06/18/2017   Procedure: ESOPHAGOGASTRODUODENOSCOPY (EGD) WITH PROPOFOL;  Surgeon: Ronnette Juniper, MD;  Location: [place];  Service: Gastroenterology;  Laterality: N/A;  . IMPLANTABLE CARDIOVERTER DEFIBRILLATOR IMPLANT  12/19/2012   St. Jude dual-chamber ICD, serial number [id number]  . IMPLANTABLE CARDIOVERTER DEFIBRILLATOR IMPLANT N/A 12/19/2012   Procedure: IMPLANTABLE CARDIOVERTER DEFIBRILLATOR IMPLANT;  Surgeon: Deboraha Sprang, MD;  Location: [facility] CATH LAB;  Service: Cardiovascular;  Laterality: N/A;  . LEFT AND RIGHT HEART CATHETERIZATION WITH CORONARY ANGIOGRAM N/A 12/17/2012   Procedure: LEFT AND RIGHT HEART CATHETERIZATION WITH CORONARY ANGIOGRAM;  Surgeon: Sherren Mocha, MD;  Location: [facility] CATH LAB;  Service: Cardiovascular;  Laterality: N/A;     A IV Location/Drains/Wounds Patient Lines/Drains/Airways Status   Active Line/Drains/Airways    Name:   Placement date:   Placement time:   Site:   Days:   Peripheral IV 01/17/19 Left Hand   01/17/19    0900    Hand   less than 1          Intake/Output Last 24 hours No intake or output data in the 24 hours ending 01/17/19 1455  Labs/Imaging Results for orders placed or performed during the hospital encounter of 01/17/19 (from the past 48 hour(s))  Basic metabolic panel  Status: Abnormal   Collection Time: 01/17/19  9:22 AM  Result Value Ref Range   Sodium 139 135 - 145 mmol/L   Potassium 3.8 3.5 - 5.1 mmol/L   Chloride 107 98 - 111 mmol/L   CO2 23 22 - 32 mmol/L   Glucose, Bld 111 (H) 70 - 99 mg/dL   BUN 38 (H) 8 - 23 mg/dL   Creatinine, Ser 1.82 (H) 0.44 - 1.00 mg/dL   Calcium 9.2 8.9 - 10.3 mg/dL   GFR calc non Af Amer 27 (L) >60 mL/min   GFR calc Af Amer 31 (L) >60 mL/min   Anion gap 9 5 - 15     Comment: Performed at [facility] [address] [place], [place] [address]  CBC with Differential     Status: Abnormal   Collection Time: 01/17/19  9:22 AM  Result Value Ref Range   WBC 4.2 4.0 - 10.5 K/uL   RBC 4.93 3.87 - 5.11 MIL/uL   Hemoglobin 11.6 (L) 12.0 - 15.0 g/dL   HCT 36.8 36.0 - 46.0 %   MCV 74.6 (L) 80.0 - 100.0 fL   MCH 23.5 (L) 26.0 - 34.0 pg   MCHC 31.5 30.0 - 36.0 g/dL   RDW 21.3 (H) 11.5 - 15.5 %   Platelets 233 150 - 400 K/uL   nRBC 0.0 0.0 - 0.2 %   Neutrophils Relative % 45 %   Neutro Abs 1.9 1.7 - 7.7 K/uL   Lymphocytes Relative 33 %   Lymphs Abs 1.4 0.7 - 4.0 K/uL   Monocytes Relative 15 %   Monocytes Absolute 0.7 0.1 - 1.0 K/uL   Eosinophils Relative 4 %   Eosinophils Absolute 0.2 0.0 - 0.5 K/uL   Basophils Relative 2 %   Basophils Absolute 0.1 0.0 - 0.1 K/uL   Immature Granulocytes 1 %   Abs Immature Granulocytes 0.02 0.00 - 0.07 K/uL    Comment: Performed at [facility] [address], [place], [place] [address]  Magnesium     Status: None   Collection Time: 01/17/19  9:22 AM  Result Value Ref Range   Magnesium 2.2 1.7 - 2.4 mg/dL    Comment: Performed at [facility] [address] [place], [place] [address]  Phosphorus     Status: None   Collection Time: 01/17/19  9:22 AM  Result Value Ref Range   Phosphorus 3.3 2.5 - 4.6 mg/dL    Comment: Performed at [facility] [address] [place], [place] [address]  Urinalysis, Routine w reflex microscopic     Status: Abnormal   Collection Time: 01/17/19  9:37 AM  Result Value Ref Range   Color, Urine YELLOW YELLOW   APPearance CLEAR CLEAR   Specific Gravity, Urine 1.010 1.005 - 1.030   pH 6.0 5.0 - 8.0   Glucose, UA NEGATIVE NEGATIVE mg/dL   Hgb urine dipstick LARGE (A) NEGATIVE   Bilirubin Urine NEGATIVE NEGATIVE   Ketones, ur NEGATIVE NEGATIVE mg/dL   Protein, ur NEGATIVE NEGATIVE mg/dL   Nitrite NEGATIVE NEGATIVE   Leukocytes,Ua NEGATIVE NEGATIVE   RBC / HPF  >50 (H) 0 - 5 RBC/hpf   WBC, UA 0-5 0 - 5 WBC/hpf   Bacteria, UA RARE (A) NONE SEEN   Squamous Epithelial / LPF 0-5 0 - 5    Comment: Performed at [facility], [place] [address], [place],  [address]   No results found.  Pending Labs  Unresulted Labs (From admission, onward)    Start     Ordered   01/18/19 4332  Basic metabolic panel  Tomorrow morning,   R     01/17/19 1433   01/17/19 1411  SARS Coronavirus 2 (CEPHEID - Performed in [facility] hospital lab), Hosp Order  (Asymptomatic Patients Labs)  Once,   STAT    Question:  Rule Out  Answer:  Yes   01/17/19 1410          Vitals/Pain Today's Vitals   01/17/19 1145 01/17/19 1200 01/17/19 1215 01/17/19 1230  BP: 118/67 117/65 120/81 139/77  Pulse: 69 70 70 71  Resp: 15 16 18 17   Temp:      TempSrc:      SpO2: 95% 95% 95% 93%  Weight:      Height:      PainSc:        Isolation Precautions No active isolations  Medications Medications  amiodarone (NEXTERONE PREMIX) 360-4.14 MG/200ML-% (1.8 mg/mL) IV infusion (60 mg/hr Intravenous New Bag/Given 01/17/19 1416)  nitroGLYCERIN (NITROSTAT) SL tablet 0.4 mg (has no administration in time range)  acetaminophen (TYLENOL) tablet 650 mg (has no administration in time range)    Mobility walks Low fall risk   Focused Assessments Cardiac Assessment Handoff:  Cardiac Rhythm: Atrial paced Lab Results  Component Value Date   TROPONINI 0.72 (HH) 06/07/2018   No results found for: DDIMER Does the Patient currently have chest pain? No     R Recommendations: See Admitting Provider Note  Report given to:   Additional Notes: .

## 2019-01-17 NOTE — Telephone Encounter (Signed)
Alert for VT event, ATP x4 & 1 30J shock at 1152; shock successfully terminated episode. 3 additional ATP episodes, successfully terminated VT. 8 NSVT events noted as well. Presenting rhythm AP/VP. Pt states she remembers feeling a little funny around the time of receiving shock, does not recall receiving shock. Pt states she feels "pretty good" today.  Per Dr. Caryl Comes, have pt go to the hospital. Pt verbalizes understanding.

## 2019-01-17 NOTE — H&P (Addendum)
Cardiology Admission History and Physical:   Patient ID: Stephanie Yates MRN: [id number]; DOB: 1945-11-11   Admission date: 01/17/2019  Primary Care Provider: Josetta Huddle, MD Primary Cardiologist/Primary Electrophysiologist:  Virl Axe, MD   Chief Complaint:  VT storm, referred to ER/hospital for admission/management  Patient Profile:   Stephanie Yates is a 73 y.o. female with h/o NICM, chronic CHF (systolic), HTN, HLD, CKD (III), paroxysmal AFib and VT w/ICD  History of Present Illness:   Stephanie Yates back in Dec 2019 was admitted with VT and her amiodarone was resumed, June 2020 noting had more VT in April that hovered at /around her detection rate, her VT zone detection rate was reduced and her amiodarone increased.  She was hospitalized 01/05/2019 with a CHF exacerbation at discharge 01/12/2019 she was instructed to reduce back to daily dosing after 2 weeks.   01/14/2019 our device clinic had an alert for VT episodes. VT-1 01/13/19 @ 2054 (24 second duration), max V rate 150bpm, ATP x1 successfully terminated. VT-1 on 01/12/19 @ 2143 (26 second duration), Max V rate 143. ATP x2 successfully terminated. These were asymptomatic and Dr. Caryl Comes ordered her amiodarone be increased to 400mg  BID and added ranexa 500mg  BID  TODAY Alert for VT event, ATP x4 & 1 30J shock at 1152; shock successfully terminated episode. 3 additional ATP episodes, successfully terminated VT. 8 NSVT events noted as well. Presenting rhythm AP/VP. Pt states she remembers feeling a little funny around the time of receiving shock, does not recall receiving shock. Pt states she feels "pretty good" today.  Per Dr. Caryl Comes, have pt go to the hospital. Pt verbalizes understanding.        Given this, Dr. Caryl Comes recommended she come into the hospital The patient has felt fairly well,, she denies any kind of CP or palpitations, no SOB.  She did have a few dizzy spells here and there though no syncope.  She did not know  she had been shocked last night until our office reached out.  LABS K+ 3.8 Mag 2.2 BUN/Creat 38/1.82 (looks her baseline) WBC 4.2 H/H 11/36 Plts 233  COVID pending She has not had any sick contacts, no symptoms of illness Was  Negative 01/05/2019    Device information:SJM dual chamber ICD, implanted 12/19/12, secondary prevention, Dr. Caryl Comes Chronically elevated RV threshold AAD tx: amiodarone (2014) > stopped (seems 2/2 no VT) > resumed 05/2018 for more VT     Heart Pathway Score:     Past Medical History:  Diagnosis Date  . Chronic anemia   . Chronic systolic CHF (congestive heart failure) ([place])   . CKD (chronic kidney disease), stage IV ([place])   . Former tobacco use   . History of tobacco abuse   . Hyperlipidemia   . Hypertension   . Mitral regurgitation   . Nonischemic cardiomyopathy ([place])    a. EF 10-15% on 11/2012 cath with no significant CAD. b. EF 25% in 2019. b. EF 15% in 12/2018  . PAF (paroxysmal atrial fibrillation) ([place])   . Protein calorie malnutrition ([place])   . Quit consuming alcohol in remote past   . Sinus bradycardia 12/21/2012  . Ventricular tachycardia ([place]) 12/14/2012    Past Surgical History:  Procedure Laterality Date  . ESOPHAGOGASTRODUODENOSCOPY (EGD) WITH PROPOFOL N/A 06/18/2017   Procedure: ESOPHAGOGASTRODUODENOSCOPY (EGD) WITH PROPOFOL;  Surgeon: Ronnette Juniper, MD;  Location: [facility];  Service: Gastroenterology;  Laterality: N/A;  . IMPLANTABLE CARDIOVERTER DEFIBRILLATOR IMPLANT  12/19/2012   St. Jude dual-chamber ICD,  serial number [id number]  . IMPLANTABLE CARDIOVERTER DEFIBRILLATOR IMPLANT N/A 12/19/2012   Procedure: IMPLANTABLE CARDIOVERTER DEFIBRILLATOR IMPLANT;  Surgeon: Deboraha Sprang, MD;  Location: [facility] CATH LAB;  Service: Cardiovascular;  Laterality: N/A;  . LEFT AND RIGHT HEART CATHETERIZATION WITH CORONARY ANGIOGRAM N/A 12/17/2012   Procedure: LEFT AND RIGHT HEART CATHETERIZATION WITH CORONARY ANGIOGRAM;  Surgeon: Sherren Mocha, MD;   Location: [facility] CATH LAB;  Service: Cardiovascular;  Laterality: N/A;     Medications Prior to Admission: Prior to Admission medications   Medication Sig Start Date End Date Taking? Authorizing Provider  acetaminophen (TYLENOL) 500 MG tablet Take 1,000 mg by mouth every 6 (six) hours as needed for mild pain.   Yes [provider]  amiodarone (PACERONE) 200 MG tablet Take 2 tablets (400 mg total) by mouth 2 (two) times daily for 14 days. Change to once daily after 2 weeks 01/12/19 01/26/19 Yes Guilford Shi, MD  apixaban (ELIQUIS) 5 MG TABS tablet Take 1 tablet (5 mg total) by mouth 2 (two) times daily. 01/12/19 02/11/19 Yes Guilford Shi, MD  atorvastatin (LIPITOR) 40 MG tablet Take 1 tablet (40 mg total) by mouth daily at 6 PM. 07/05/18  Yes Deboraha Sprang, MD  carvedilol (COREG) 3.125 MG tablet Take 1 tablet (3.125 mg total) by mouth 2 (two) times daily with a meal. 09/12/18  Yes Deboraha Sprang, MD  Cholecalciferol (VITAMIN D-3 PO) Take 1,000 Units by mouth at bedtime.    Yes [provider]  ferrous gluconate (FERGON) 324 MG tablet Take 324 mg by mouth 2 (two) times daily.  07/05/18  Yes [provider]  furosemide (LASIX) 80 MG tablet Take 1 tablet (80 mg total) by mouth daily. 07/05/18  Yes Deboraha Sprang, MD  hydrALAZINE (APRESOLINE) 25 MG tablet Take 0.5 tablets (12.5 mg total) by mouth 3 (three) times daily. 01/12/19  Yes Guilford Shi, MD  isosorbide mononitrate (IMDUR) 30 MG 24 hr tablet Take 0.5 tablets (15 mg total) by mouth daily. 01/13/19  Yes Guilford Shi, MD  levalbuterol (XOPENEX HFA) 45 MCG/ACT inhaler Inhale 1 puff into the lungs every 4 (four) hours as needed for wheezing. 01/12/19 01/12/20 Yes Guilford Shi, MD  magnesium oxide (MAG-OX) 400 (241.3 Mg) MG tablet Take 1 tablet (400 mg total) by mouth daily. 07/17/18  Yes Deboraha Sprang, MD  metolazone (ZAROXOLYN) 2.5 MG tablet Take 1 tablet (2.5 mg total) by mouth daily as needed (leg swellings or  shortness of breath or weight gain>5 pounds in 48 hours). 01/12/19  Yes Guilford Shi, MD  nitroGLYCERIN (NITROSTAT) 0.4 MG SL tablet Place 1 tablet (0.4 mg total) under the tongue every 5 (five) minutes as needed for chest pain. 01/12/19  Yes Guilford Shi, MD  pantoprazole (PROTONIX) 40 MG tablet Take 1 tablet (40 mg total) by mouth 2 (two) times daily before a meal. 06/18/17  Yes Lavina Hamman, MD  ranolazine (RANEXA) 500 MG 12 hr tablet Take 1 tablet (500 mg total) by mouth 2 (two) times daily. 01/17/19 02/16/19 Yes Deboraha Sprang, MD     Allergies:    Allergies  Allergen Reactions  . Strawberry Extract Hives, Itching, Swelling and Other (See Comments)    Hives and itching     Social History:   Social History   Socioeconomic History  . Marital status: Widowed    Spouse name: Not on file  . Number of children: Not on file  . Years of education: Not on file  . Highest education level:  Not on file  Occupational History  . Not on file  Social Needs  . Financial resource strain: Not on file  . Food insecurity    Worry: Not on file    Inability: Not on file  . Transportation needs    Medical: Not on file    Non-medical: Not on file  Tobacco Use  . Smoking status: Former Research scientist (life sciences)  . Smokeless tobacco: Never Used  Substance and Sexual Activity  . Alcohol use: No  . Drug use: No  . Sexual activity: Not Currently  Lifestyle  . Physical activity    Days per week: Not on file    Minutes per session: Not on file  . Stress: Not on file  Relationships  . Social Herbalist on phone: Not on file    Gets together: Not on file    Attends religious service: Not on file    Active member of club or organization: Not on file    Attends meetings of clubs or organizations: Not on file    Relationship status: Not on file  . Intimate partner violence    Fear of current or ex partner: Not on file    Emotionally abused: Not on file    Physically abused: Not on file     Forced sexual activity: Not on file  Other Topics Concern  . Not on file  Social History Narrative  . Not on file    Family History:  The patient's family history includes Cancer in her mother.    ROS:  Please see the history of present illness.  All other ROS reviewed and negative.     Physical Exam/Data:   Vitals:   01/17/19 1145 01/17/19 1200 01/17/19 1215 01/17/19 1230  BP: 118/67 117/65 120/81 139/77  Pulse: 69 70 70 71  Resp: 15 16 18 17   Temp:      TempSrc:      SpO2: 95% 95% 95% 93%  Weight:      Height:       No intake or output data in the 24 hours ending 01/17/19 1341 Last 3 Weights 01/17/2019 01/12/2019 01/10/2019  Weight (lbs) 134 lb 6.4 oz 134 lb 6.4 oz 139 lb 14.4 oz  Weight (kg) 60.963 kg 60.963 kg 63.458 kg     Body mass index is 21.69 kg/m.  General:  Well nourished, though thin, well developed, in no acute distress HEENT: normal Lymph: no adenopathy Neck: no JVD Endocrine:  No thryomegaly Vascular: No carotid bruits;  Cardiac:  RRR; 1-2/6 SM, no gallops or rubs Lungs:  CTA b/l, no wheezing, rhonchi or rales  Abd: soft, nontender  Ext: no edema Musculoskeletal:  No deformities, age appropriate atrophy Skin: warm and dry  Neuro:   No gross focal abnormalities noted Psych:  Normal affect    EKG:  The ECG that was done was personally reviewed and demonstrates AV paced rhythm  Relevant CV Studies:  2D echo 01/07/19 1. The left ventricle has a visually estimated ejection fraction of 15%. The cavity size was mildly dilated. Left ventricular diastolic Doppler parameters are consistent with impaired relaxation. Left ventricular diffuse hypokinesis. No LV thrombus  noted. 2. The right ventricle has normal systolic function. The cavity was normal. There is no increase in right ventricular wall thickness. 3. Left atrial size was moderately dilated. 4. The mitral valve is degenerative. Mild calcification of the mitral valve leaflet. There is moderate  mitral annular calcification present. Mitral valve regurgitation is  mild to moderate by color flow Doppler. Functional MR with restriction of  posterior leaflet. No evidence of mitral valve stenosis. 5. The aortic valve is tricuspid. Mild calcification of the aortic valve. No stenosis of the aortic valve. 6. Trivial pericardial effusion is present. 7. Normal IVC size. PA systolic pressure 39 mmHg.  In review of prior cardiology notes; 2014 underwent heart cath showing EF 10-15% and patent coronaries without CAD (luminal irregularities in LAD otherwise normal)  Laboratory Data:  High Sensitivity Troponin:   Recent Labs  Lab 01/05/19 2213 01/06/19 0016  TROPONINIHS 7 6      Cardiac EnzymesNo results for input(s): TROPONINI in the last 168 hours. No results for input(s): TROPIPOC in the last 168 hours.  Chemistry Recent Labs  Lab 01/12/19 0807 01/17/19 0922  NA 136 139  K 3.8 3.8  CL 104 107  CO2 23 23  GLUCOSE 88 111*  BUN 39* 38*  CREATININE 2.24* 1.82*  CALCIUM 8.9 9.2  GFRNONAA 21* 27*  GFRAA 24* 31*  ANIONGAP 9 9    No results for input(s): PROT, ALBUMIN, AST, ALT, ALKPHOS, BILITOT in the last 168 hours. Hematology Recent Labs  Lab 01/17/19 0922  WBC 4.2  RBC 4.93  HGB 11.6*  HCT 36.8  MCV 74.6*  MCH 23.5*  MCHC 31.5  RDW 21.3*  PLT 233   BNPNo results for input(s): BNP, PROBNP in the last 168 hours.  DDimer No results for input(s): DDIMER in the last 168 hours.   Radiology/Studies:  No results found.  Assessment and Plan:   1. VT storm  No anginal symptoms AV pacing here in the ER      Admit to ICU     amio gtt, no bolus, run at 60mg /hr without titration     She had not yet gotten the ranexa started will get it started here        2. NICM (LVEF 15% going back years) 3. Chronic CHF (systolic)     Appears euvolemic currently     Continue home meds       4. Paroxysmal Afib     CHA2DSVasc is 4, on Eliquis, appropriately dosed age/weight      H/H stable   5. HTN     Continue home meds   6. Code status     The patient last stay carried partial code status (no intubation)     I spoke with her today, she tells me she would want everything to be done for her including intubation and ventilation     She is made full code       For questions or updates, please contact [place] Please consult [email or web address] for contact info under    Signed, [name], PA-C  01/17/2019 1:41 PM   EP Attending  Patient seen and examined. Agree with the findings as noted above. The patient presents today with VT storm, despite fairly high dose oral amiodarone. She does not appear to be volume overloaded. She denies anginal symptoms. No obvious culprit. She will be admitted for IV amiodarone. Dr. Renaldo Reel to see tomorrow. We will keep her on telemetry tonight as she appears to be stable at the present time and is not in decompensated CHF.  Mikle Bosworth.D.

## 2019-01-17 NOTE — Telephone Encounter (Signed)
Noted  

## 2019-01-17 NOTE — ED Triage Notes (Signed)
Pt arrives from home stating that the dr. Gabriel Carina telling her to go to hospital. Pacemaker fired yesterday & dr. Rosanna Randy her to be seen. Pt is asymptomatic. Pts pacemaker started pacing her again during the EMS ride. Pt A&O x4

## 2019-01-17 NOTE — ED Provider Notes (Signed)
[facility] EMERGENCY DEPARTMENT Provider Note   CSN: [id number] Arrival date & time: 01/17/19  4158    History   Chief Complaint Chief Complaint  Patient presents with  . Pacemaker Pacing    HPI Stephanie Yates is a 73 y.o. female.     HPI 73 year old female with history of advanced CHF, A. Fib and AICD placement comes in with chief complaint of pacemaker firing.  Patient reports that she received a call from the cardiology team indicating that she had AICD discharge.  She did not have any chest discomfort due to the AICD discharge.  She has appreciated an episode of sweating and an episode of feeling tired -but generally speaking she has not had any serious concerns or symptoms.  She has been taking her medications as prescribed and denies any recent illnesses.  Review of system is negative for any burning with urination, abdominal pain, nausea, vomiting, diarrhea, cough, sick contacts.  Past Medical History:  Diagnosis Date  . Chronic anemia   . Chronic systolic CHF (congestive heart failure) ([place])   . CKD (chronic kidney disease), stage IV ([place])   . Former tobacco use   . History of tobacco abuse   . Hyperlipidemia   . Hypertension   . Mitral regurgitation   . Nonischemic cardiomyopathy ([place])    a. EF 10-15% on 11/2012 cath with no significant CAD. b. EF 25% in 2019. b. EF 15% in 12/2018  . PAF (paroxysmal atrial fibrillation) ([place])   . Protein calorie malnutrition ([place])   . Quit consuming alcohol in remote past   . Sinus bradycardia 12/21/2012  . Ventricular tachycardia ([place]) 12/14/2012    Patient Active Problem List   Diagnosis Date Noted  . AKI (acute kidney injury) ([place]) 01/12/2019  . Palliative care encounter   . CHF exacerbation ([place]) 01/06/2019  . Influenza A 09/02/2018  . Lactic acidosis 09/02/2018  . Chronic combined systolic and diastolic congestive heart failure ([place]) 09/02/2018  . Rapid atrial fibrillation ([place]) 06/08/2018  . Acute  on chronic combined systolic and diastolic heart failure ([place]) 06/07/2018  . Anemia 06/07/2018  . Acute GI bleeding 06/15/2017  . Chronic anticoagulation   . Acute on chronic congestive heart failure ([place])   . HLD (hyperlipidemia) 03/05/2017  . CKD (chronic kidney disease), stage III ([place]) 03/05/2017  . Chest pain 03/05/2017  . Symptomatic anemia 03/05/2017  . PAF (paroxysmal atrial fibrillation) ([place]) 04/01/2013  . ICD (implantable cardioverter-defibrillator), dual, st Judes 04/01/2013  . PVC's (premature ventricular contractions) 04/01/2013  . Hypokalemia 12/21/2012  . History of tobacco abuse 12/21/2012  . Elevated troponin 12/21/2012  . Sinus bradycardia 12/21/2012  . Hyperkalemia 12/20/2012  . Nonischemic cardiomyopathy ([place]) 12/20/2012  . UTI (urinary tract infection) 12/20/2012  . Acute on chronic combined systolic and diastolic CHF (congestive heart failure) ([place]) 12/20/2012  . Ventricular tachycardia ([place]) 12/14/2012  . Hypertension 12/14/2012    Past Surgical History:  Procedure Laterality Date  . ESOPHAGOGASTRODUODENOSCOPY (EGD) WITH PROPOFOL N/A 06/18/2017   Procedure: ESOPHAGOGASTRODUODENOSCOPY (EGD) WITH PROPOFOL;  Surgeon: Ronnette Juniper, MD;  Location: [facility];  Service: Gastroenterology;  Laterality: N/A;  . IMPLANTABLE CARDIOVERTER DEFIBRILLATOR IMPLANT  12/19/2012   St. Jude dual-chamber ICD, serial number [id number]  . IMPLANTABLE CARDIOVERTER DEFIBRILLATOR IMPLANT N/A 12/19/2012   Procedure: IMPLANTABLE CARDIOVERTER DEFIBRILLATOR IMPLANT;  Surgeon: Deboraha Sprang, MD;  Location: [facility] CATH LAB;  Service: Cardiovascular;  Laterality: N/A;  . LEFT AND RIGHT HEART CATHETERIZATION WITH CORONARY ANGIOGRAM N/A 12/17/2012  Procedure: LEFT AND RIGHT HEART CATHETERIZATION WITH CORONARY ANGIOGRAM;  Surgeon: Sherren Mocha, MD;  Location: [facility] CATH LAB;  Service: Cardiovascular;  Laterality: N/A;     OB History   No obstetric history on file.      Home Medications     Prior to Admission medications   Medication Sig Start Date End Date Taking? Authorizing Provider  acetaminophen (TYLENOL) 500 MG tablet Take 1,000 mg by mouth every 6 (six) hours as needed for mild pain.   Yes [provider]  amiodarone (PACERONE) 200 MG tablet Take 2 tablets (400 mg total) by mouth 2 (two) times daily for 14 days. Change to once daily after 2 weeks 01/12/19 01/26/19 Yes Guilford Shi, MD  apixaban (ELIQUIS) 5 MG TABS tablet Take 1 tablet (5 mg total) by mouth 2 (two) times daily. 01/12/19 02/11/19 Yes Guilford Shi, MD  atorvastatin (LIPITOR) 40 MG tablet Take 1 tablet (40 mg total) by mouth daily at 6 PM. 07/05/18  Yes Deboraha Sprang, MD  carvedilol (COREG) 3.125 MG tablet Take 1 tablet (3.125 mg total) by mouth 2 (two) times daily with a meal. 09/12/18  Yes Deboraha Sprang, MD  Cholecalciferol (VITAMIN D-3 PO) Take 1,000 Units by mouth at bedtime.    Yes [provider]  ferrous gluconate (FERGON) 324 MG tablet Take 324 mg by mouth 2 (two) times daily.  07/05/18  Yes [provider]  furosemide (LASIX) 80 MG tablet Take 1 tablet (80 mg total) by mouth daily. 07/05/18  Yes Deboraha Sprang, MD  hydrALAZINE (APRESOLINE) 25 MG tablet Take 0.5 tablets (12.5 mg total) by mouth 3 (three) times daily. 01/12/19  Yes Guilford Shi, MD  isosorbide mononitrate (IMDUR) 30 MG 24 hr tablet Take 0.5 tablets (15 mg total) by mouth daily. 01/13/19  Yes Guilford Shi, MD  levalbuterol (XOPENEX HFA) 45 MCG/ACT inhaler Inhale 1 puff into the lungs every 4 (four) hours as needed for wheezing. 01/12/19 01/12/20 Yes Guilford Shi, MD  magnesium oxide (MAG-OX) 400 (241.3 Mg) MG tablet Take 1 tablet (400 mg total) by mouth daily. 07/17/18  Yes Deboraha Sprang, MD  metolazone (ZAROXOLYN) 2.5 MG tablet Take 1 tablet (2.5 mg total) by mouth daily as needed (leg swellings or shortness of breath or weight gain>5 pounds in 48 hours). 01/12/19  Yes Guilford Shi, MD   nitroGLYCERIN (NITROSTAT) 0.4 MG SL tablet Place 1 tablet (0.4 mg total) under the tongue every 5 (five) minutes as needed for chest pain. 01/12/19  Yes Guilford Shi, MD  pantoprazole (PROTONIX) 40 MG tablet Take 1 tablet (40 mg total) by mouth 2 (two) times daily before a meal. 06/18/17  Yes Lavina Hamman, MD  ranolazine (RANEXA) 500 MG 12 hr tablet Take 1 tablet (500 mg total) by mouth 2 (two) times daily. 01/17/19 02/16/19 Yes Deboraha Sprang, MD    Family History Family History  Problem Relation Age of Onset  . Cancer Mother     Social History Social History   Tobacco Use  . Smoking status: Former Research scientist (life sciences)  . Smokeless tobacco: Never Used  Substance Use Topics  . Alcohol use: No  . Drug use: No     Allergies   Strawberry extract   Review of Systems Review of Systems  Constitutional: Negative for fever.  Respiratory: Negative for shortness of breath.   Cardiovascular: Negative for chest pain.  Genitourinary: Negative for dysuria.  Neurological: Negative for dizziness.  All other systems reviewed and are negative.  Physical Exam Updated Vital Signs BP 139/77   Pulse 71   Temp 97.7 F (36.5 C) (Oral)   Resp 17   Ht 5\' 6"  (1.676 m)   Wt 61 kg   SpO2 93%   BMI 21.69 kg/m   Physical Exam Vitals signs and nursing note reviewed.  Constitutional:      Appearance: She is well-developed.  HENT:     Head: Normocephalic and atraumatic.  Eyes:     Pupils: Pupils are equal, round, and reactive to light.  Neck:     Musculoskeletal: Neck supple.  Cardiovascular:     Rate and Rhythm: Normal rate and regular rhythm.     Heart sounds: Normal heart sounds. No murmur.  Pulmonary:     Effort: Pulmonary effort is normal. No respiratory distress.  Abdominal:     General: There is no distension.     Palpations: Abdomen is soft.     Tenderness: There is no abdominal tenderness. There is no guarding or rebound.  Skin:    General: Skin is warm and dry.   Neurological:     Mental Status: She is alert and oriented to person, place, and time.      ED Treatments / Results  Labs (all labs ordered are listed, but only abnormal results are displayed) Labs Reviewed  BASIC METABOLIC PANEL - Abnormal; Notable for the following components:      Result Value   Glucose, Bld 111 (*)    BUN 38 (*)    Creatinine, Ser 1.82 (*)    GFR calc non Af Amer 27 (*)    GFR calc Af Amer 31 (*)    All other components within normal limits  CBC WITH DIFFERENTIAL/PLATELET - Abnormal; Notable for the following components:   Hemoglobin 11.6 (*)    MCV 74.6 (*)    MCH 23.5 (*)    RDW 21.3 (*)    All other components within normal limits  URINALYSIS, ROUTINE W REFLEX MICROSCOPIC - Abnormal; Notable for the following components:   Hgb urine dipstick LARGE (*)    RBC / HPF >50 (*)    Bacteria, UA RARE (*)    All other components within normal limits  SARS CORONAVIRUS 2 (HOSPITAL ORDER, [facility] LAB)  MAGNESIUM  PHOSPHORUS    EKG EKG Interpretation  Date/Time:  Thursday January 17 2019 09:08:26 EDT Ventricular Rate:  74 PR Interval:    QRS Duration: 168 QT Interval:  645 QTC Calculation: 702 R Axis:   -98 Text Interpretation:  A-V dual-paced rhythm with some inhibition No further analysis attempted due to paced rhythm paced rhythm Confirmed by Varney Biles ([id number]) on 01/17/2019 9:21:23 AM   Radiology No results found.  Procedures Procedures (including critical care time)  Medications Ordered in ED Medications  amiodarone (NEXTERONE PREMIX) 360-4.14 MG/200ML-% (1.8 mg/mL) IV infusion (60 mg/hr Intravenous New Bag/Given 01/17/19 1416)     Initial Impression / Assessment and Plan / ED Course  I have reviewed the triage vital signs and the nursing notes.  Pertinent labs & imaging results that were available during my care of the patient were reviewed by me and considered in my medical decision making (see chart for  details).        73 year old female comes in a chief complaint of pacemaker firing. She has multiple severe medical comorbidities.  I have reviewed patient's chart and it appears that she had 2 or 3 separate episodes of AICD discharging over the last  3 days.  Patient might have increase her amiodarone, and there is plan to perhaps start another antiarrhythmic agent.  Given the persistent discharge she was advised to come to the ER.  I spoke with cardiology team.  They would like to admit the patient.  It does not seem like there is underlying infectious process or medication noncompliance.  Final Clinical Impressions(s) / ED Diagnoses   Final diagnoses:  V-tach [facility])    ED Discharge Orders    None       Varney Biles, MD 01/17/19 1428

## 2019-01-18 ENCOUNTER — Encounter (HOSPITAL_COMMUNITY): Payer: Self-pay | Admitting: *Deleted

## 2019-01-18 LAB — BASIC METABOLIC PANEL
Anion gap: 11 (ref 5–15)
BUN: 39 mg/dL — ABNORMAL HIGH (ref 8–23)
CO2: 25 mmol/L (ref 22–32)
Calcium: 8.9 mg/dL (ref 8.9–10.3)
Chloride: 105 mmol/L (ref 98–111)
Creatinine, Ser: 2.04 mg/dL — ABNORMAL HIGH (ref 0.44–1.00)
GFR calc Af Amer: 27 mL/min — ABNORMAL LOW (ref >60)
GFR calc non Af Amer: 23 mL/min — ABNORMAL LOW (ref >60)
Glucose, Bld: 103 mg/dL — ABNORMAL HIGH (ref 70–99)
Potassium: 3.8 mmol/L (ref 3.5–5.1)
Sodium: 141 mmol/L (ref 135–145)

## 2019-01-18 MED ORDER — AMIODARONE HCL 200 MG PO TABS
400.0000 mg | ORAL_TABLET | Freq: Three times a day (TID) | ORAL | Status: DC
  Administered 2019-01-18 – 2019-01-21 (×9): 400 mg via ORAL
  Filled 2019-01-18 (×9): qty 2

## 2019-01-18 MED ORDER — MEXILETINE HCL 200 MG PO CAPS
200.0000 mg | ORAL_CAPSULE | Freq: Two times a day (BID) | ORAL | Status: DC
  Administered 2019-01-18 – 2019-01-24 (×13): 200 mg via ORAL
  Filled 2019-01-18 (×15): qty 1

## 2019-01-18 MED ORDER — CHLORHEXIDINE GLUCONATE CLOTH 2 % EX PADS
6.0000 | MEDICATED_PAD | Freq: Every day | CUTANEOUS | Status: DC
  Administered 2019-01-18 – 2019-01-24 (×3): 6 via TOPICAL

## 2019-01-18 MED ORDER — AMIODARONE HCL 200 MG PO TABS
400.0000 mg | ORAL_TABLET | Freq: Three times a day (TID) | ORAL | Status: DC
  Filled 2019-01-18: qty 2

## 2019-01-18 NOTE — Progress Notes (Signed)
Patient transferred to 3E23 on tele.  RN and NT to receive patient.

## 2019-01-18 NOTE — Progress Notes (Addendum)
Progress Note  Patient Name: Stephanie Yates Date of Encounter: 01/18/2019  Primary Cardiologist: Dr. Caryl Comes  Subjective   "I feel well", no CP or SOB  Inpatient Medications    Scheduled Meds: . amiodarone  400 mg Oral TID  . apixaban  5 mg Oral BID  . atorvastatin  40 mg Oral q1800  . carvedilol  3.125 mg Oral BID WC  . ferrous gluconate  324 mg Oral BID  . furosemide  80 mg Oral Daily  . hydrALAZINE  12.5 mg Oral TID  . isosorbide mononitrate  15 mg Oral Daily  . magnesium oxide  400 mg Oral Daily  . mexiletine  200 mg Oral Q12H  . pantoprazole  40 mg Oral BID AC   Continuous Infusions:  PRN Meds: acetaminophen, levalbuterol, nitroGLYCERIN   Vital Signs    Vitals:   01/18/19 0600 01/18/19 0700 01/18/19 0800 01/18/19 0819  BP: 109/62 (!) 103/55 (!) 99/56 (!) 96/52  Pulse: 73 72 71 70  Resp: 15 19 15    Temp:  97.7 F (36.5 C)    TempSrc:  Oral    SpO2: 94% 93% 96%   Weight:      Height:        Intake/Output Summary (Last 24 hours) at 01/18/2019 0846 Last data filed at 01/18/2019 0800 Gross per 24 hour  Intake 588.69 ml  Output -  Net 588.69 ml   Last 3 Weights 01/18/2019 01/17/2019 01/12/2019  Weight (lbs) 134 lb 7.7 oz 134 lb 6.4 oz 134 lb 6.4 oz  Weight (kg) 61 kg 60.963 kg 60.963 kg      Telemetry    Mostly AV pacing, occ AP/VS, no VT - Personally Reviewed  ECG    AV paced (fused beats as well) - Personally Reviewed  Physical Exam   GEN: No acute distress.   Neck: No JVD Cardiac: RRR, no murmurs, rubs, or gallops.  Respiratory: CTA b/l. GI: Soft, nontender, non-distended  MS: No edema; No deformity. Neuro:  Nonfocal  Psych: Normal affect   Labs    High Sensitivity Troponin:   Recent Labs  Lab 01/05/19 2213 01/06/19 0016  TROPONINIHS 7 6      Cardiac EnzymesNo results for input(s): TROPONINI in the last 168 hours. No results for input(s): TROPIPOC in the last 168 hours.   Chemistry Recent Labs  Lab 01/12/19 0807 01/17/19 0922  01/18/19 0340  NA 136 139 141  K 3.8 3.8 3.8  CL 104 107 105  CO2 23 23 25   GLUCOSE 88 111* 103*  BUN 39* 38* 39*  CREATININE 2.24* 1.82* 2.04*  CALCIUM 8.9 9.2 8.9  GFRNONAA 21* 27* 23*  GFRAA 24* 31* 27*  ANIONGAP 9 9 11      Hematology Recent Labs  Lab 01/17/19 0922  WBC 4.2  RBC 4.93  HGB 11.6*  HCT 36.8  MCV 74.6*  MCH 23.5*  MCHC 31.5  RDW 21.3*  PLT 233    BNPNo results for input(s): BNP, PROBNP in the last 168 hours.   DDimer No results for input(s): DDIMER in the last 168 hours.   Radiology    No results found.  Cardiac Studies   2D echo 01/07/19 1. The left ventricle has a visually estimated ejection fraction of 15%. The cavity size was mildly dilated. Left ventricular diastolic Doppler parameters are consistent with impaired relaxation. Left ventricular diffuse hypokinesis. No LV thrombus  noted. 2. The right ventricle has normal systolic function. The cavity was normal. There  is no increase in right ventricular wall thickness. 3. Left atrial size was moderately dilated. 4. The mitral valve is degenerative. Mild calcification of the mitral valve leaflet. There is moderate mitral annular calcification present. Mitral valve regurgitation is mild to moderate by color flow Doppler. Functional MR with restriction of  posterior leaflet. No evidence of mitral valve stenosis. 5. The aortic valve is tricuspid. Mild calcification of the aortic valve. No stenosis of the aortic valve. 6. Trivial pericardial effusion is present. 7. Normal IVC size. PA systolic pressure 39 mmHg.  In review of prior cardiology notes; 2014 underwent heart cath showing EF 10-15% and patent coronaries without CAD (luminal irregularities in LAD otherwise normal)  Patient Profile     73 y.o. female  h/o NICM, chronic CHF (systolic), HTN, HLD, CKD (III), paroxysmal AFib and VT w/ICD admitted with VT storm  Device information: SJM dual chamber ICD, implanted  12/19/12,secondaryprevention, Dr. Caryl Comes Chronically elevated RV threshold AAD tx: amiodarone (2014)> stopped (seems 2/2 no VT) > resumed 05/2018 for more VT  Assessment & Plan     1. VT storm  No anginal symptoms AV pacing here   No ranexa given renal insufficiency Will transition to PO amiodarone with completion of this bag of amio Add mexiletine        2. NICM (LVEF 15% going back years) 3. Chronic CHF (systolic)     Appears euvolemic currently     on good tx (no ACE/ARB given CKD)       4. Paroxysmal Afib     CHA2DSVasc is 4, on Eliquis, appropriately dosed age/weight     H/H stable   5. HTN     BP lower overnight, ? IV amio  ER BP looks better, follow once we transition back to PO amiodarone Hopefully there is room for up-titration of her BB   6. Code status     The patient last stay carried partial code status (no intubation)     I spoke with her today, she tells me she would want everything to be done for her including intubation and ventilation     She is made full code  Last week with her prior hospital stay, AHF team did not feel she was a candidate for advanced therapies, and avoid inotropes given her VT.  There was some discussion towards palliative conversations, though patient was not ready to consider Hospice.   Likely will move out of ICU today Dr. Caryl Comes to see this AM 1st      For questions or updates, please contact [facility] Please consult [email or web address] for contact info under        Signed, [name], PA-C  01/18/2019, 8:46 AM    VT storm.  Device interrogation demonstrates at least 5 VT's spontaneously initiating  Nonischemic cardiomyopathy  ICD-Saint Jude  IVCD QRS duration 134 3/20  First-degree AV block  Hypotension  Renal insufficiency grade 3-4  Atrial fibrillation-persistent   There has been increasing ventricular tachycardia over recent months.  There is a recent acceleration over weeks.  This  is concurrent with increasing ventricular pacing burden temporally associated with increasing amiodarone.  It is not clear whether the ventricular pacing is contributing to the electrical instability or not; we have reprogrammed her device to promote intrinsic conduction (baseline QRS duration about 135 ms-IVCD)  We have added mexiletine to her amiodarone for ventricular tachycardia suppression.  She and I had a lengthy conversation which will need to be reiterated noting that the ventricular  tachycardia may well be secondary to worsening electromechanical interactions and that treating the ventricular tachycardia may not suffice to reverse the underlying cardiomyopathic deterioration.  It is a hope that the pacing is contributing to that deterioration and that lengthening the AV conduction time and allowing for intrinsic conduction will allow that process to reverse.  Alternative drug options are limited by her renal insufficiency.  Ranolazine is precluded.  Probably adjunctive class Ia's are precluded as is sotalol.  Hence, we will try mexiletine as noted above.  We have also had a lengthy discussion regarding this status of her heart condition. We have discussed her left ventricular function is severely impaired and its relationship to heart failure as well as ventricular tachycardia. I acknowledge that she has had conversations over the last day or so regarding CODE STATUS.  I have tried to assure her that palliative care discussions are intended to help [place] understand how best to take care of her if her status deteriorates and we do not think that we can make her better.  I will reach out to Dr Inda Merlin, her PCP-- he raises a question about compliances related to finances.  Previous comments re LVH and low voltage are not apparent on review making TTR amyloid less likely

## 2019-01-18 NOTE — Plan of Care (Signed)
  Problem: Education: Goal: Ability to manage disease process will improve Outcome: Progressing   Problem: Cardiac: Goal: Ability to achieve and maintain adequate cardiopulmonary perfusion will improve Outcome: Progressing

## 2019-01-18 NOTE — Discharge Instructions (Addendum)

## 2019-01-18 NOTE — Plan of Care (Signed)
  Problem: Education: Goal: Ability to manage disease process will improve Outcome: Progressing Goal: Individualized Educational Video(s) Outcome: Progressing   Problem: Cardiac: Goal: Ability to achieve and maintain adequate cardiopulmonary perfusion will improve Outcome: Progressing   Problem: Education: Goal: Knowledge of General Education information will improve Description: Including pain rating scale, medication(s)/side effects and non-pharmacologic comfort measures Outcome: Progressing   Problem: Health Behavior/Discharge Planning: Goal: Ability to manage health-related needs will improve Outcome: Progressing   Problem: Clinical Measurements: Goal: Ability to maintain clinical measurements within normal limits will improve Outcome: Progressing Goal: Will remain free from infection Outcome: Progressing Goal: Diagnostic test results will improve Outcome: Progressing Goal: Respiratory complications will improve Outcome: Progressing Goal: Cardiovascular complication will be avoided Outcome: Progressing   Problem: Activity: Goal: Risk for activity intolerance will decrease Outcome: Progressing   Problem: Nutrition: Goal: Adequate nutrition will be maintained Outcome: Progressing   Problem: Coping: Goal: Level of anxiety will decrease Outcome: Progressing   Problem: Elimination: Goal: Will not experience complications related to bowel motility Outcome: Progressing Goal: Will not experience complications related to urinary retention Outcome: Progressing   Problem: Pain Managment: Goal: General experience of comfort will improve Outcome: Progressing   Problem: Safety: Goal: Ability to remain free from injury will improve Outcome: Progressing   Problem: Skin Integrity: Goal: Risk for impaired skin integrity will decrease Outcome: Progressing

## 2019-01-19 MED ORDER — ZOLPIDEM TARTRATE 5 MG PO TABS
5.0000 mg | ORAL_TABLET | Freq: Every evening | ORAL | Status: DC | PRN
  Administered 2019-01-19 – 2019-01-23 (×5): 5 mg via ORAL
  Filled 2019-01-19 (×5): qty 1

## 2019-01-19 NOTE — Plan of Care (Signed)
  Problem: Education: Goal: Ability to manage disease process will improve Outcome: Progressing Goal: Individualized Educational Video(s) Outcome: Progressing   Problem: Cardiac: Goal: Ability to achieve and maintain adequate cardiopulmonary perfusion will improve Outcome: Progressing   Problem: Education: Goal: Knowledge of General Education information will improve Description: Including pain rating scale, medication(s)/side effects and non-pharmacologic comfort measures Outcome: Progressing   Problem: Health Behavior/Discharge Planning: Goal: Ability to manage health-related needs will improve Outcome: Progressing   Problem: Clinical Measurements: Goal: Ability to maintain clinical measurements within normal limits will improve Outcome: Progressing Goal: Will remain free from infection Outcome: Progressing Goal: Diagnostic test results will improve Outcome: Progressing Goal: Respiratory complications will improve Outcome: Progressing Goal: Cardiovascular complication will be avoided Outcome: Progressing   Problem: Activity: Goal: Risk for activity intolerance will decrease Outcome: Progressing   Problem: Nutrition: Goal: Adequate nutrition will be maintained Outcome: Progressing   Problem: Coping: Goal: Level of anxiety will decrease Outcome: Progressing   Problem: Elimination: Goal: Will not experience complications related to bowel motility Outcome: Progressing Goal: Will not experience complications related to urinary retention Outcome: Progressing   Problem: Pain Managment: Goal: General experience of comfort will improve Outcome: Progressing   Problem: Safety: Goal: Ability to remain free from injury will improve Outcome: Progressing   Problem: Skin Integrity: Goal: Risk for impaired skin integrity will decrease Outcome: Progressing

## 2019-01-19 NOTE — Progress Notes (Signed)
Progress Note  Patient Name: Stephanie Yates Date of Encounter: 01/19/2019  Primary Cardiologist: No primary care provider on file.   Subjective   No chest pain or sob. C/o difficulty sleeping.   Inpatient Medications    Scheduled Meds: . amiodarone  400 mg Oral TID  . apixaban  5 mg Oral BID  . atorvastatin  40 mg Oral q1800  . carvedilol  3.125 mg Oral BID WC  . Chlorhexidine Gluconate Cloth  6 each Topical Daily  . ferrous gluconate  324 mg Oral BID  . furosemide  80 mg Oral Daily  . hydrALAZINE  12.5 mg Oral TID  . isosorbide mononitrate  15 mg Oral Daily  . magnesium oxide  400 mg Oral Daily  . mexiletine  200 mg Oral Q12H  . pantoprazole  40 mg Oral BID AC   Continuous Infusions:  PRN Meds: acetaminophen, levalbuterol, nitroGLYCERIN   Vital Signs    Vitals:   01/18/19 1630 01/18/19 1938 01/19/19 0028 01/19/19 0416  BP: (!) 106/56 (!) 108/53 (!) 99/44 (!) 109/51  Pulse: 60 60 (!) 59 (!) 59  Resp: 18 18 18 18   Temp: 97.9 F (36.6 C) 98.4 F (36.9 C) 98.1 F (36.7 C) 97.7 F (36.5 C)  TempSrc: Oral Oral Oral Oral  SpO2: 96% 96% 97% 94%  Weight:    62.3 kg  Height:        Intake/Output Summary (Last 24 hours) at 01/19/2019 0807 Last data filed at 01/19/2019 0400 Gross per 24 hour  Intake 1093.52 ml  Output -  Net 1093.52 ml   Filed Weights   01/18/19 0400 01/18/19 1226 01/19/19 0416  Weight: 61 kg 62.1 kg 62.3 kg    Telemetry    nsr with atrial pacing - Personally Reviewed  ECG    none - Personally Reviewed  Physical Exam   GEN: No acute distress.   Neck: No JVD Cardiac: RRR, no murmurs, rubs, or gallops.  Respiratory: Clear to auscultation bilaterally. GI: Soft, nontender, non-distended  MS: No edema; No deformity. Neuro:  Nonfocal  Psych: Normal affect   Labs    Chemistry Recent Labs  Lab 01/17/19 0922 01/18/19 0340  NA 139 141  K 3.8 3.8  CL 107 105  CO2 23 25  GLUCOSE 111* 103*  BUN 38* 39*  CREATININE 1.82* 2.04*   CALCIUM 9.2 8.9  GFRNONAA 27* 23*  GFRAA 31* 27*  ANIONGAP 9 11     Hematology Recent Labs  Lab 01/17/19 0922  WBC 4.2  RBC 4.93  HGB 11.6*  HCT 36.8  MCV 74.6*  MCH 23.5*  MCHC 31.5  RDW 21.3*  PLT 233    Cardiac EnzymesNo results for input(s): TROPONINI in the last 168 hours. No results for input(s): TROPIPOC in the last 168 hours.   BNPNo results for input(s): BNP, PROBNP in the last 168 hours.   DDimer No results for input(s): DDIMER in the last 168 hours.   Radiology    No results found.  Cardiac Studies   none  Patient Profile     73 y.o. female admitted with VT storm, and increased dose of amio and started on mexitil.  Assessment & Plan    1. VT storm - she has quieted down. She will continue amio and mexitil. 2. Chronic systolic heart failure - her symptoms are well compensated. She denies dyspnea. She will continue her current meds. 3. Insomnia - I will add a low dose of a sleeping aid.  4. PAF - she is maintaining NSR and should continue with high dose of amiodarone.   ,M.D.  For questions or updates, please contact [facility] Please consult [email or web address] for contact info under Cardiology/STEMI.      Signed, Cristopher Peru, MD  01/19/2019, 8:07 AM  Patient ID: Stephanie Yates, female   DOB: 1946-04-11, 73 y.o.   MRN: [id number]

## 2019-01-20 NOTE — Progress Notes (Signed)
CCMD notified of pt temporarily off tele for bath.

## 2019-01-20 NOTE — Progress Notes (Signed)
Patient resting comfortably during shift report. Denies complaints.  

## 2019-01-20 NOTE — Progress Notes (Signed)
Pt BP 98/45 and HR 59. Paged on call MD. Stated to give amiodarone and mexiletine and to hold hydralazine.

## 2019-01-20 NOTE — Progress Notes (Signed)
Progress Note  Patient Name: Stephanie Yates Date of Encounter: 01/20/2019  Primary Cardiologist: No primary care provider on file.   Subjective   No chest pain or sob. "I slept better." denies feeling somnolent.  Inpatient Medications    Scheduled Meds: . amiodarone  400 mg Oral TID  . apixaban  5 mg Oral BID  . atorvastatin  40 mg Oral q1800  . carvedilol  3.125 mg Oral BID WC  . Chlorhexidine Gluconate Cloth  6 each Topical Daily  . ferrous gluconate  324 mg Oral BID  . furosemide  80 mg Oral Daily  . hydrALAZINE  12.5 mg Oral TID  . isosorbide mononitrate  15 mg Oral Daily  . magnesium oxide  400 mg Oral Daily  . mexiletine  200 mg Oral Q12H  . pantoprazole  40 mg Oral BID AC   Continuous Infusions:  PRN Meds: acetaminophen, levalbuterol, nitroGLYCERIN, zolpidem   Vital Signs    Vitals:   01/19/19 1955 01/20/19 0603 01/20/19 0605 01/20/19 0840  BP: 99/65  (!) 103/49 111/64  Pulse: 60  (!) 59 (!) 59  Resp: 18  18   Temp: 98.3 F (36.8 C)  98.1 F (36.7 C)   TempSrc: Oral  Oral   SpO2: 94%  93% 93%  Weight:  61.8 kg    Height:        Intake/Output Summary (Last 24 hours) at 01/20/2019 0912 Last data filed at 01/20/2019 0700 Gross per 24 hour  Intake 840 ml  Output 400 ml  Net 440 ml   Filed Weights   01/18/19 1226 01/19/19 0416 01/20/19 0603  Weight: 62.1 kg 62.3 kg 61.8 kg    Telemetry    nsr with atrial pacing - Personally Reviewed  ECG    none - Personally Reviewed  Physical Exam   GEN: No acute distress.   Neck: No JVD Cardiac: RRR, no murmurs, rubs, or gallops.  Respiratory: Clear to auscultation bilaterally. GI: Soft, nontender, non-distended  MS: No edema; No deformity. Neuro:  Nonfocal  Psych: Normal affect   Labs    Chemistry Recent Labs  Lab 01/17/19 0922 01/18/19 0340  NA 139 141  K 3.8 3.8  CL 107 105  CO2 23 25  GLUCOSE 111* 103*  BUN 38* 39*  CREATININE 1.82* 2.04*  CALCIUM 9.2 8.9  GFRNONAA 27* 23*  GFRAA  31* 27*  ANIONGAP 9 11     Hematology Recent Labs  Lab 01/17/19 0922  WBC 4.2  RBC 4.93  HGB 11.6*  HCT 36.8  MCV 74.6*  MCH 23.5*  MCHC 31.5  RDW 21.3*  PLT 233    Cardiac EnzymesNo results for input(s): TROPONINI in the last 168 hours. No results for input(s): TROPIPOC in the last 168 hours.   BNPNo results for input(s): BNP, PROBNP in the last 168 hours.   DDimer No results for input(s): DDIMER in the last 168 hours.   Radiology    No results found.  Cardiac Studies   none  Patient Profile     73 y.o. female admitted with VT storm. She has severe LV dysfunction but is euvolemic  Assessment & Plan    1. VT storm - she has quieted down nicely on amiodarone and mexitil. We will continue and plan for DC home tomorrow.  2. Insomnia - she slept well with low dose ambient. She has requested this as an outpatient 3. Chronic systolic heart failure - her symptoms are class 2. She appears  well compensated and euvolemic.  4. Disp. - we will plan for DC home tomorrow.   Jomel Whittlesey,M.D.  For questions or updates, please contact [place] Please consult [email or web address] for contact info under Cardiology/STEMI.      Signed, Cristopher Peru, MD  01/20/2019, 9:12 AM  Patient ID: Stephanie Yates, female   DOB: 23-Jul-1945, 73 y.o.   MRN: [id number]

## 2019-01-21 DIAGNOSIS — R1012 Left upper quadrant pain: Secondary | ICD-10-CM

## 2019-01-21 MED ORDER — SODIUM CHLORIDE 0.9 % IV SOLN
INTRAVENOUS | Status: DC
  Administered 2019-01-21: 21:00:00 via INTRAVENOUS

## 2019-01-21 MED ORDER — AMIODARONE HCL 200 MG PO TABS
400.0000 mg | ORAL_TABLET | Freq: Two times a day (BID) | ORAL | Status: DC
  Administered 2019-01-21 – 2019-01-24 (×6): 400 mg via ORAL
  Filled 2019-01-21 (×6): qty 2

## 2019-01-21 NOTE — H&P (View-Only) (Signed)
Referring Provider: Dr. Caryl Comes Primary Care Physician:  Josetta Huddle, MD Primary Gastroenterologist:  Dr. Therisa Doyne  Reason for Consultation:  Abdominal pain  HPI: Stephanie Yates is a 73 y.o. female admitted for management of ventricular tachycardia who has been having intermittent epigastric pain for months that will sometimes last all day. Pain is an aching pain that is not sharp and will have nausea with it without vomiting. Denies heartburn or dysphagia. Denies radiation of the pain. When the pain is present she has no appetite to eat. Black stools on iron pills. On Eliquis. EGD in 2018 by Dr. Therisa Doyne showed antral ulcers that were oozing blood and argon plasma coagulation done to fulgurate them. A benign duodenal hyperplastic polyp was removed and gastric polyp biopsy were benign and negative for H. Pylori. Colonoscopy in 2011 showed left-sided diverticulosis and was otherwise normal.  Past Medical History:  Diagnosis Date  . Chronic anemia   . Chronic systolic CHF (congestive heart failure) ([place])   . CKD (chronic kidney disease), stage IV ([place])   . Former tobacco use   . History of tobacco abuse   . Hyperlipidemia   . Hypertension   . Mitral regurgitation   . Nonischemic cardiomyopathy ([place])    a. EF 10-15% on 11/2012 cath with no significant CAD. b. EF 25% in 2019. b. EF 15% in 12/2018  . PAF (paroxysmal atrial fibrillation) ([place])   . Protein calorie malnutrition ([place])   . Quit consuming alcohol in remote past   . Sinus bradycardia 12/21/2012  . Ventricular tachycardia ([place]) 12/14/2012    Past Surgical History:  Procedure Laterality Date  . ESOPHAGOGASTRODUODENOSCOPY (EGD) WITH PROPOFOL N/A 06/18/2017   Procedure: ESOPHAGOGASTRODUODENOSCOPY (EGD) WITH PROPOFOL;  Surgeon: Ronnette Juniper, MD;  Location: [place];  Service: Gastroenterology;  Laterality: N/A;  . IMPLANTABLE CARDIOVERTER DEFIBRILLATOR IMPLANT  12/19/2012   St. Jude dual-chamber ICD, serial number [id number]  . IMPLANTABLE  CARDIOVERTER DEFIBRILLATOR IMPLANT N/A 12/19/2012   Procedure: IMPLANTABLE CARDIOVERTER DEFIBRILLATOR IMPLANT;  Surgeon: Deboraha Sprang, MD;  Location: [facility] CATH LAB;  Service: Cardiovascular;  Laterality: N/A;  . LEFT AND RIGHT HEART CATHETERIZATION WITH CORONARY ANGIOGRAM N/A 12/17/2012   Procedure: LEFT AND RIGHT HEART CATHETERIZATION WITH CORONARY ANGIOGRAM;  Surgeon: Sherren Mocha, MD;  Location: [facility] CATH LAB;  Service: Cardiovascular;  Laterality: N/A;    Prior to Admission medications   Medication Sig Start Date End Date Taking? Authorizing Provider  acetaminophen (TYLENOL) 500 MG tablet Take 1,000 mg by mouth every 6 (six) hours as needed for mild pain.   Yes [provider]  amiodarone (PACERONE) 200 MG tablet Take 2 tablets (400 mg total) by mouth 2 (two) times daily for 14 days. Change to once daily after 2 weeks 01/12/19 01/26/19 Yes Guilford Shi, MD  apixaban (ELIQUIS) 5 MG TABS tablet Take 1 tablet (5 mg total) by mouth 2 (two) times daily. 01/12/19 02/11/19 Yes Guilford Shi, MD  atorvastatin (LIPITOR) 40 MG tablet Take 1 tablet (40 mg total) by mouth daily at 6 PM. 07/05/18  Yes Deboraha Sprang, MD  carvedilol (COREG) 3.125 MG tablet Take 1 tablet (3.125 mg total) by mouth 2 (two) times daily with a meal. 09/12/18  Yes Deboraha Sprang, MD  Cholecalciferol (VITAMIN D-3 PO) Take 1,000 Units by mouth at bedtime.    Yes [provider]  ferrous gluconate (FERGON) 324 MG tablet Take 324 mg by mouth 2 (two) times daily.  07/05/18  Yes [provider]  furosemide (LASIX) 80 MG  tablet Take 1 tablet (80 mg total) by mouth daily. 07/05/18  Yes Deboraha Sprang, MD  hydrALAZINE (APRESOLINE) 25 MG tablet Take 0.5 tablets (12.5 mg total) by mouth 3 (three) times daily. 01/12/19  Yes Guilford Shi, MD  isosorbide mononitrate (IMDUR) 30 MG 24 hr tablet Take 0.5 tablets (15 mg total) by mouth daily. 01/13/19  Yes Guilford Shi, MD  levalbuterol (XOPENEX HFA) 45 MCG/ACT  inhaler Inhale 1 puff into the lungs every 4 (four) hours as needed for wheezing. 01/12/19 01/12/20 Yes Guilford Shi, MD  magnesium oxide (MAG-OX) 400 (241.3 Mg) MG tablet Take 1 tablet (400 mg total) by mouth daily. 07/17/18  Yes Deboraha Sprang, MD  metolazone (ZAROXOLYN) 2.5 MG tablet Take 1 tablet (2.5 mg total) by mouth daily as needed (leg swellings or shortness of breath or weight gain>5 pounds in 48 hours). 01/12/19  Yes Guilford Shi, MD  nitroGLYCERIN (NITROSTAT) 0.4 MG SL tablet Place 1 tablet (0.4 mg total) under the tongue every 5 (five) minutes as needed for chest pain. 01/12/19  Yes Guilford Shi, MD  pantoprazole (PROTONIX) 40 MG tablet Take 1 tablet (40 mg total) by mouth 2 (two) times daily before a meal. 06/18/17  Yes Lavina Hamman, MD  ranolazine (RANEXA) 500 MG 12 hr tablet TAKE 1 TABLET BY MOUTH TWICE DAILY 01/21/19   Deboraha Sprang, MD    Scheduled Meds: . amiodarone  400 mg Oral BID  . apixaban  5 mg Oral BID  . atorvastatin  40 mg Oral q1800  . carvedilol  3.125 mg Oral BID WC  . Chlorhexidine Gluconate Cloth  6 each Topical Daily  . ferrous gluconate  324 mg Oral BID  . furosemide  80 mg Oral Daily  . hydrALAZINE  12.5 mg Oral TID  . isosorbide mononitrate  15 mg Oral Daily  . magnesium oxide  400 mg Oral Daily  . mexiletine  200 mg Oral Q12H  . pantoprazole  40 mg Oral BID AC   Continuous Infusions: PRN Meds:.acetaminophen, levalbuterol, nitroGLYCERIN, zolpidem  Allergies as of 01/17/2019 - Review Complete 01/17/2019  Allergen Reaction Noted  . Strawberry extract Hives, Itching, Swelling, and Other (See Comments) 12/14/2012    Family History  Problem Relation Age of Onset  . Cancer Mother     Social History   Socioeconomic History  . Marital status: Widowed    Spouse name: Not on file  . Number of children: Not on file  . Years of education: Not on file  . Highest education level: Not on file  Occupational History  . Not on file   Social Needs  . Financial resource strain: Not on file  . Food insecurity    Worry: Not on file    Inability: Not on file  . Transportation needs    Medical: Not on file    Non-medical: Not on file  Tobacco Use  . Smoking status: Former Research scientist (life sciences)  . Smokeless tobacco: Never Used  Substance and Sexual Activity  . Alcohol use: No  . Drug use: No  . Sexual activity: Not Currently  Lifestyle  . Physical activity    Days per week: Not on file    Minutes per session: Not on file  . Stress: Not on file  Relationships  . Social Herbalist on phone: Not on file    Gets together: Not on file    Attends religious service: Not on file    Active member of club or  organization: Not on file    Attends meetings of clubs or organizations: Not on file    Relationship status: Not on file  . Intimate partner violence    Fear of current or ex partner: Not on file    Emotionally abused: Not on file    Physically abused: Not on file    Forced sexual activity: Not on file  Other Topics Concern  . Not on file  Social History Narrative  . Not on file    Review of Systems: All negative except as stated above in HPI.  Physical Exam: Vital signs: Vitals:   01/21/19 1158 01/21/19 1657  BP: (!) 105/56 118/64  Pulse: 60 60  Resp: 18 18  Temp: (!) 97.5 F (36.4 C)   SpO2: 95%    Last BM Date: 01/20/19 General:  Lethargic, elderly, frail, well-nourished, pleasant   Head: normocephalic, atraumatic Eyes: anicteric sclera ENT: oropharynx clear Neck: supple, nontender Lungs:  Clear throughout to auscultation.   No wheezes, crackles, or rhonchi. No acute distress. Heart:  Regular rate and rhythm; no murmurs, clicks, rubs,  or gallops. Abdomen: epigastric tenderness with guarding, soft, nondistended, +BS  Rectal:  Deferred Ext: no edema  GI:  Lab Results: No results for input(s): WBC, HGB, HCT, PLT in the last 72 hours. BMET No results for input(s): NA, K, CL, CO2, GLUCOSE, BUN,  CREATININE, CALCIUM in the last 72 hours. LFT No results for input(s): PROT, ALBUMIN, AST, ALT, ALKPHOS, BILITOT, BILIDIR, IBILI in the last 72 hours. PT/INR No results for input(s): LABPROT, INR in the last 72 hours.   Studies/Results: No results found.  Impression/Plan: Epigastric pain with history of peptic ulcer disease in need of an updated EGD to be done tomorrow. Eliquis discontinued for planned procedure and ok to hold per Dr. Caryl Comes. Clear liquid diet. NPO p MN. Continue PPI 40 mg BID due to history of PUD. Supportive care.    LOS: 4 days   [name]  01/21/2019, 6:09 PM  Questions please call [phone]

## 2019-01-21 NOTE — Progress Notes (Addendum)
Progress Note  Patient Name: DIMPLE BASTYR Date of Encounter: 01/21/2019  Primary Cardiologist: No primary care provider on file.   Subjective  Heart feels good NO nausea with meds C/o abdominal pain, mid epigastric, sub xyhphoid Not aggravated by eating nor relieved; no relief with eructation No changes in BM Has been an issue on and off for months, and not particularly worse today,  But she has also never mentioned it before, and thus not sure about her history  Inpatient Medications    Scheduled Meds: . amiodarone  400 mg Oral TID  . apixaban  5 mg Oral BID  . atorvastatin  40 mg Oral q1800  . carvedilol  3.125 mg Oral BID WC  . Chlorhexidine Gluconate Cloth  6 each Topical Daily  . ferrous gluconate  324 mg Oral BID  . furosemide  80 mg Oral Daily  . hydrALAZINE  12.5 mg Oral TID  . isosorbide mononitrate  15 mg Oral Daily  . magnesium oxide  400 mg Oral Daily  . mexiletine  200 mg Oral Q12H  . pantoprazole  40 mg Oral BID AC   Continuous Infusions:  PRN Meds: acetaminophen, levalbuterol, nitroGLYCERIN, zolpidem   Vital Signs    Vitals:   01/20/19 2138 01/21/19 0151 01/21/19 0425 01/21/19 0833  BP: (!) 98/46  111/61 102/75  Pulse: (!) 59  (!) 59 60  Resp:   18 18  Temp:   98.2 F (36.8 C)   TempSrc:   Oral   SpO2:   93%   Weight:  62.2 kg    Height:        Intake/Output Summary (Last 24 hours) at 01/21/2019 1146 Last data filed at 01/21/2019 1000 Gross per 24 hour  Intake 740 ml  Output 1300 ml  Net -560 ml   Filed Weights   01/19/19 0416 01/20/19 0603 01/21/19 0151  Weight: 62.3 kg 61.8 kg 62.2 kg    Telemetry    nsr with atrial pacing - Personally Reviewed  ECG    none - Personally Reviewed  Physical Exam   Well developed and nourished in no acute distress HENT normal Neck supple with JVP  Regular rate and rhythm, no murmurs or gallops Abd-soft with active BS  RUQ tenderness --reproducible with sense of fullness to my "limited"  abilities  No Clubbing cyanosis edema Skin-warm and dry A & Oriented  Grossly normal sensory and motor function      Labs    Chemistry Recent Labs  Lab 01/17/19 0922 01/18/19 0340  NA 139 141  K 3.8 3.8  CL 107 105  CO2 23 25  GLUCOSE 111* 103*  BUN 38* 39*  CREATININE 1.82* 2.04*  CALCIUM 9.2 8.9  GFRNONAA 27* 23*  GFRAA 31* 27*  ANIONGAP 9 11     Hematology Recent Labs  Lab 01/17/19 0922  WBC 4.2  RBC 4.93  HGB 11.6*  HCT 36.8  MCV 74.6*  MCH 23.5*  MCHC 31.5  RDW 21.3*  PLT 233    Cardiac EnzymesNo results for input(s): TROPONINI in the last 168 hours. No results for input(s): TROPIPOC in the last 168 hours.   BNPNo results for input(s): BNP, PROBNP in the last 168 hours.   DDimer No results for input(s): DDIMER in the last 168 hours.   Radiology    No results found.  Cardiac Studies   none  Patient Profile     73 y.o. female admitted with VT storm. She has severe LV  dysfunction but is euvolemic  Assessment & Plan    VT storm.  Device interrogation demonstrates at least 5 VT's spontaneously initiating  Nonischemic cardiomyopathy  ICD-Saint Jude  IVCD QRS duration 134 3/20  First-degree AV block  Hypotension  Renal insufficiency grade 3-4  Atrial fibrillation-persistent  Abdominal pain   VT quiet Will decrease amio 400 bid x 2 weeks then 400 daily Continue mexilitene  Abdominal pain recurrent with LUQ tenderness -- will ask [facility] to see-- consult called  The fact that it antedates hospitalization makes a drug intolerance of her antiarrhythmics less likely  Dr Michail Sermon to see  Will plan discharge to home following GI eval       Virl Axe, MD  01/21/2019, 11:46 AM  Patient ID: Gardiner Ramus, female   DOB: [birth year], 73 y.o.   MRN: [id number]

## 2019-01-21 NOTE — Care Management Important Message (Signed)
Important Message  Patient Details  Name: KIRSTIN KUGLER MRN: [id number] Date of Birth: Nov 20, 1945   Medicare Important Message Given:  Yes     Shelda Altes 01/21/2019, 1:22 PM

## 2019-01-21 NOTE — Consult Note (Signed)
Referring Provider: Dr. Caryl Comes Primary Care Physician:  Josetta Huddle, MD Primary Gastroenterologist:  Dr. Therisa Doyne  Reason for Consultation:  Abdominal pain  HPI: Stephanie Yates is a 73 y.o. female admitted for management of ventricular tachycardia who has been having intermittent epigastric pain for months that will sometimes last all day. Pain is an aching pain that is not sharp and will have nausea with it without vomiting. Denies heartburn or dysphagia. Denies radiation of the pain. When the pain is present she has no appetite to eat. Black stools on iron pills. On Eliquis. EGD in 2018 by Dr. Therisa Doyne showed antral ulcers that were oozing blood and argon plasma coagulation done to fulgurate them. A benign duodenal hyperplastic polyp was removed and gastric polyp biopsy were benign and negative for H. Pylori. Colonoscopy in 2011 showed left-sided diverticulosis and was otherwise normal.  Past Medical History:  Diagnosis Date  . Chronic anemia   . Chronic systolic CHF (congestive heart failure) ([place])   . CKD (chronic kidney disease), stage IV ([place])   . Former tobacco use   . History of tobacco abuse   . Hyperlipidemia   . Hypertension   . Mitral regurgitation   . Nonischemic cardiomyopathy ([place])    a. EF 10-15% on 11/2012 cath with no significant CAD. b. EF 25% in 2019. b. EF 15% in 12/2018  . PAF (paroxysmal atrial fibrillation) ([place])   . Protein calorie malnutrition ([place])   . Quit consuming alcohol in remote past   . Sinus bradycardia 12/21/2012  . Ventricular tachycardia ([place]) 12/14/2012    Past Surgical History:  Procedure Laterality Date  . ESOPHAGOGASTRODUODENOSCOPY (EGD) WITH PROPOFOL N/A 06/18/2017   Procedure: ESOPHAGOGASTRODUODENOSCOPY (EGD) WITH PROPOFOL;  Surgeon: Ronnette Juniper, MD;  Location: [place];  Service: Gastroenterology;  Laterality: N/A;  . IMPLANTABLE CARDIOVERTER DEFIBRILLATOR IMPLANT  12/19/2012   St. Jude dual-chamber ICD, serial number [id number]  . IMPLANTABLE  CARDIOVERTER DEFIBRILLATOR IMPLANT N/A 12/19/2012   Procedure: IMPLANTABLE CARDIOVERTER DEFIBRILLATOR IMPLANT;  Surgeon: Deboraha Sprang, MD;  Location: [facility] CATH LAB;  Service: Cardiovascular;  Laterality: N/A;  . LEFT AND RIGHT HEART CATHETERIZATION WITH CORONARY ANGIOGRAM N/A 12/17/2012   Procedure: LEFT AND RIGHT HEART CATHETERIZATION WITH CORONARY ANGIOGRAM;  Surgeon: Sherren Mocha, MD;  Location: [facility] CATH LAB;  Service: Cardiovascular;  Laterality: N/A;    Prior to Admission medications   Medication Sig Start Date End Date Taking? Authorizing Provider  acetaminophen (TYLENOL) 500 MG tablet Take 1,000 mg by mouth every 6 (six) hours as needed for mild pain.   Yes [provider]  amiodarone (PACERONE) 200 MG tablet Take 2 tablets (400 mg total) by mouth 2 (two) times daily for 14 days. Change to once daily after 2 weeks 01/12/19 01/26/19 Yes Guilford Shi, MD  apixaban (ELIQUIS) 5 MG TABS tablet Take 1 tablet (5 mg total) by mouth 2 (two) times daily. 01/12/19 02/11/19 Yes Guilford Shi, MD  atorvastatin (LIPITOR) 40 MG tablet Take 1 tablet (40 mg total) by mouth daily at 6 PM. 07/05/18  Yes Deboraha Sprang, MD  carvedilol (COREG) 3.125 MG tablet Take 1 tablet (3.125 mg total) by mouth 2 (two) times daily with a meal. 09/12/18  Yes Deboraha Sprang, MD  Cholecalciferol (VITAMIN D-3 PO) Take 1,000 Units by mouth at bedtime.    Yes [provider]  ferrous gluconate (FERGON) 324 MG tablet Take 324 mg by mouth 2 (two) times daily.  07/05/18  Yes [provider]  furosemide (LASIX) 80 MG  tablet Take 1 tablet (80 mg total) by mouth daily. 07/05/18  Yes Deboraha Sprang, MD  hydrALAZINE (APRESOLINE) 25 MG tablet Take 0.5 tablets (12.5 mg total) by mouth 3 (three) times daily. 01/12/19  Yes Guilford Shi, MD  isosorbide mononitrate (IMDUR) 30 MG 24 hr tablet Take 0.5 tablets (15 mg total) by mouth daily. 01/13/19  Yes Guilford Shi, MD  levalbuterol (XOPENEX HFA) 45 MCG/ACT  inhaler Inhale 1 puff into the lungs every 4 (four) hours as needed for wheezing. 01/12/19 01/12/20 Yes Guilford Shi, MD  magnesium oxide (MAG-OX) 400 (241.3 Mg) MG tablet Take 1 tablet (400 mg total) by mouth daily. 07/17/18  Yes Deboraha Sprang, MD  metolazone (ZAROXOLYN) 2.5 MG tablet Take 1 tablet (2.5 mg total) by mouth daily as needed (leg swellings or shortness of breath or weight gain>5 pounds in 48 hours). 01/12/19  Yes Guilford Shi, MD  nitroGLYCERIN (NITROSTAT) 0.4 MG SL tablet Place 1 tablet (0.4 mg total) under the tongue every 5 (five) minutes as needed for chest pain. 01/12/19  Yes Guilford Shi, MD  pantoprazole (PROTONIX) 40 MG tablet Take 1 tablet (40 mg total) by mouth 2 (two) times daily before a meal. 06/18/17  Yes Lavina Hamman, MD  ranolazine (RANEXA) 500 MG 12 hr tablet TAKE 1 TABLET BY MOUTH TWICE DAILY 01/21/19   Deboraha Sprang, MD    Scheduled Meds: . amiodarone  400 mg Oral BID  . apixaban  5 mg Oral BID  . atorvastatin  40 mg Oral q1800  . carvedilol  3.125 mg Oral BID WC  . Chlorhexidine Gluconate Cloth  6 each Topical Daily  . ferrous gluconate  324 mg Oral BID  . furosemide  80 mg Oral Daily  . hydrALAZINE  12.5 mg Oral TID  . isosorbide mononitrate  15 mg Oral Daily  . magnesium oxide  400 mg Oral Daily  . mexiletine  200 mg Oral Q12H  . pantoprazole  40 mg Oral BID AC   Continuous Infusions: PRN Meds:.acetaminophen, levalbuterol, nitroGLYCERIN, zolpidem  Allergies as of 01/17/2019 - Review Complete 01/17/2019  Allergen Reaction Noted  . Strawberry extract Hives, Itching, Swelling, and Other (See Comments) 12/14/2012    Family History  Problem Relation Age of Onset  . Cancer Mother     Social History   Socioeconomic History  . Marital status: Widowed    Spouse name: Not on file  . Number of children: Not on file  . Years of education: Not on file  . Highest education level: Not on file  Occupational History  . Not on file   Social Needs  . Financial resource strain: Not on file  . Food insecurity    Worry: Not on file    Inability: Not on file  . Transportation needs    Medical: Not on file    Non-medical: Not on file  Tobacco Use  . Smoking status: Former Research scientist (life sciences)  . Smokeless tobacco: Never Used  Substance and Sexual Activity  . Alcohol use: No  . Drug use: No  . Sexual activity: Not Currently  Lifestyle  . Physical activity    Days per week: Not on file    Minutes per session: Not on file  . Stress: Not on file  Relationships  . Social Herbalist on phone: Not on file    Gets together: Not on file    Attends religious service: Not on file    Active member of club or  organization: Not on file    Attends meetings of clubs or organizations: Not on file    Relationship status: Not on file  . Intimate partner violence    Fear of current or ex partner: Not on file    Emotionally abused: Not on file    Physically abused: Not on file    Forced sexual activity: Not on file  Other Topics Concern  . Not on file  Social History Narrative  . Not on file    Review of Systems: All negative except as stated above in HPI.  Physical Exam: Vital signs: Vitals:   01/21/19 1158 01/21/19 1657  BP: (!) 105/56 118/64  Pulse: 60 60  Resp: 18 18  Temp: (!) 97.5 F (36.4 C)   SpO2: 95%    Last BM Date: 01/20/19 General:  Lethargic, elderly, frail, well-nourished, pleasant   Head: normocephalic, atraumatic Eyes: anicteric sclera ENT: oropharynx clear Neck: supple, nontender Lungs:  Clear throughout to auscultation.   No wheezes, crackles, or rhonchi. No acute distress. Heart:  Regular rate and rhythm; no murmurs, clicks, rubs,  or gallops. Abdomen: epigastric tenderness with guarding, soft, nondistended, +BS  Rectal:  Deferred Ext: no edema  GI:  Lab Results: No results for input(s): WBC, HGB, HCT, PLT in the last 72 hours. BMET No results for input(s): NA, K, CL, CO2, GLUCOSE, BUN,  CREATININE, CALCIUM in the last 72 hours. LFT No results for input(s): PROT, ALBUMIN, AST, ALT, ALKPHOS, BILITOT, BILIDIR, IBILI in the last 72 hours. PT/INR No results for input(s): LABPROT, INR in the last 72 hours.   Studies/Results: No results found.  Impression/Plan: Epigastric pain with history of peptic ulcer disease in need of an updated EGD to be done tomorrow. Eliquis discontinued for planned procedure and ok to hold per Dr. Caryl Comes. Clear liquid diet. NPO p MN. Continue PPI 40 mg BID due to history of PUD. Supportive care.    LOS: 4 days   [name]  01/21/2019, 6:09 PM  Questions please call [phone]

## 2019-01-22 ENCOUNTER — Encounter (HOSPITAL_COMMUNITY): Payer: Self-pay | Admitting: Anesthesiology

## 2019-01-22 ENCOUNTER — Inpatient Hospital Stay (HOSPITAL_COMMUNITY): Payer: Medicare Other | Admitting: Certified Registered Nurse Anesthetist

## 2019-01-22 ENCOUNTER — Encounter (HOSPITAL_COMMUNITY)
Admission: EM | Disposition: A | Discharge status: Home / Self Care | Payer: Self-pay | Source: Home / Self Care | Attending: Internal Medicine

## 2019-01-22 DIAGNOSIS — R1013 Epigastric pain: Secondary | ICD-10-CM

## 2019-01-22 HISTORY — PX: ESOPHAGOGASTRODUODENOSCOPY (EGD) WITH PROPOFOL: SHX5813

## 2019-01-22 HISTORY — PX: BIOPSY: SHX5522

## 2019-01-22 HISTORY — PX: SCLEROTHERAPY: SHX6841

## 2019-01-22 SURGERY — ESOPHAGOGASTRODUODENOSCOPY (EGD) WITH PROPOFOL
Anesthesia: Monitor Anesthesia Care

## 2019-01-22 MED ORDER — SODIUM CHLORIDE 0.9 % IV SOLN
INTRAVENOUS | Status: DC | PRN
  Administered 2019-01-22: 11:00:00 via INTRAVENOUS

## 2019-01-22 MED ORDER — LACTATED RINGERS IV SOLN: INTRAVENOUS | Status: DC | PRN

## 2019-01-22 MED ORDER — SODIUM CHLORIDE (PF) 0.9 % IJ SOLN
PREFILLED_SYRINGE | INTRAMUSCULAR | Status: DC | PRN
  Administered 2019-01-22: 3 mL

## 2019-01-22 MED ORDER — LIDOCAINE 2% (20 MG/ML) 5 ML SYRINGE
INTRAMUSCULAR | Status: DC | PRN
  Administered 2019-01-22: 20 mg via INTRAVENOUS

## 2019-01-22 MED ORDER — BUTAMBEN-TETRACAINE-BENZOCAINE 2-2-14 % EX AERO
INHALATION_SPRAY | CUTANEOUS | Status: DC | PRN
  Administered 2019-01-22: 11:00:00 2 via TOPICAL

## 2019-01-22 MED ORDER — PROPOFOL 500 MG/50ML IV EMUL
INTRAVENOUS | Status: DC | PRN
  Administered 2019-01-22: 25 ug/kg/min via INTRAVENOUS

## 2019-01-22 MED ORDER — PANTOPRAZOLE SODIUM 40 MG IV SOLR
40.0000 mg | Freq: Two times a day (BID) | INTRAVENOUS | Status: DC
  Administered 2019-01-22 – 2019-01-23 (×3): 40 mg via INTRAVENOUS
  Filled 2019-01-22 (×5): qty 40

## 2019-01-22 SURGICAL SUPPLY — 15 items

## 2019-01-22 NOTE — Brief Op Note (Signed)
Antral nodular inflammation with one nodule that was oozing blood and was also very friable. Biopsies taken and epinephrine:saline 1:10,000 U injected with hemostasis achieved. See endopro note for complete details and recs. Hold Eliquis today and resume tomorrow. Clear liquid diet. Changed to IV PPI Q 12 hours. Would keep in hospital today and if stable advance diet tomorrow and d/c tomorrow if ok with Dr. Caryl Comes.

## 2019-01-22 NOTE — Anesthesia Preprocedure Evaluation (Addendum)
Anesthesia Evaluation  Patient identified by MRN, date of birth, ID band Patient awake    Reviewed: Allergy & Precautions, NPO status , Patient's Chart, lab work & pertinent test results  Airway Mallampati: I  TM Distance: >3 FB Neck ROM: Full    Dental  (+) Edentulous Upper, Edentulous Lower   Pulmonary former smoker,     + decreased breath sounds      Cardiovascular hypertension, +CHF  + dysrhythmias Atrial Fibrillation and Ventricular Tachycardia  Rhythm:Regular Rate:Normal  Paced   Neuro/Psych negative neurological ROS     GI/Hepatic negative GI ROS, Neg liver ROS,   Endo/Other  negative endocrine ROS  Renal/GU CRFRenal disease     Musculoskeletal negative musculoskeletal ROS (+)   Abdominal Normal abdominal exam  (+)   Peds  Hematology negative hematology ROS (+)   Anesthesia Other Findings   Reproductive/Obstetrics                            Echo: 1. The left ventricle has a visually estimated ejection fraction of 15%. The cavity size was mildly dilated. Left ventricular diastolic Doppler parameters are consistent with impaired relaxation. Left ventricular diffuse hypokinesis. No LV thrombus  noted.  2. The right ventricle has normal systolic function. The cavity was normal. There is no increase in right ventricular wall thickness.  3. Left atrial size was moderately dilated.  4. The mitral valve is degenerative. Mild calcification of the mitral valve leaflet. There is moderate mitral annular calcification present. Mitral valve regurgitation is mild to moderate by color flow Doppler. Functional MR with restriction of  posterior leaflet. No evidence of mitral valve stenosis.  5. The aortic valve is tricuspid. Mild calcification of the aortic valve. No stenosis of the aortic valve.  6. Trivial pericardial effusion is present.  7. Normal IVC size. PA systolic pressure 39  mmHg.  Anesthesia Physical Anesthesia Plan  ASA: IV  Anesthesia Plan: MAC   Post-op Pain Management:    Induction: Intravenous  PONV Risk Score and Plan: 2 and Propofol infusion and Ondansetron  Airway Management Planned: Natural Airway  Additional Equipment: None  Intra-op Plan:   Post-operative Plan:   Informed Consent: I have reviewed the patients History and Physical, chart, labs and discussed the procedure including the risks, benefits and alternatives for the proposed anesthesia with the patient or authorized representative who has indicated his/her understanding and acceptance.       Plan Discussed with: CRNA  Anesthesia Plan Comments:        Anesthesia Quick Evaluation

## 2019-01-22 NOTE — Op Note (Signed)
[facility] Patient Name: Stephanie Yates Procedure Date : 01/22/2019 MRN: [id number] Attending MD: [name] , MD Date of Birth: [birth year] CSN: [id number] Age: 73 Admit Type: Inpatient Procedure:                Upper GI endoscopy Indications:              Epigastric abdominal pain, Personal history of                            peptic ulcer disease Providers:                [name], MD, Carlyn Reichert, RN, Grace Isaac, RN Referring MD:             hospital team Medicines:                Propofol per Anesthesia, Monitored Anesthesia Care Complications:            No immediate complications. Estimated Blood Loss:     Estimated blood loss was minimal. Procedure:                Pre-Anesthesia Assessment:                           - Prior to the procedure, a History and Physical                            was performed, and patient medications and                            allergies were reviewed. The patient's tolerance of                            previous anesthesia was also reviewed. The risks                            and benefits of the procedure and the sedation                            options and risks were discussed with the patient.                            All questions were answered, and informed consent                            was obtained. Prior Anticoagulants: The patient has                            taken Eliquis (apixaban), last dose was 1 day prior                            to procedure. ASA Grade Assessment: IV - A patient  with severe systemic disease that is a constant                            threat to life. After reviewing the risks and                            benefits, the patient was deemed in satisfactory                            condition to undergo the procedure.                           After obtaining informed consent, the endoscope was              passed under direct vision. Throughout the                            procedure, the patient's blood pressure, pulse, and                            oxygen saturations were monitored continuously. The                            GIF-H190 ([id number]) Olympus gastroscope was                            introduced through the mouth, and advanced to the                            second part of duodenum. The upper GI endoscopy was                            accomplished without difficulty. The patient                            tolerated the procedure well. Scope In: Scope Out: Findings:      Segmental mild mucosal changes characterized by linear areas of       desquamation were found in the entire esophagus.      The Z-line was regular and was found 38 cm from the incisors.      Segmental severe mucosal changes characterized by congestion, erythema,       erosion, inflammation and nodularity were found in the gastric antrum.       Biopsies were taken with a cold forceps for histology. Estimated blood       loss was minimal. Area was successfully injected with 3 mL of a 1:10,000       solution of epinephrine for hemostasis. Estimated blood loss was minimal.      A small hiatal hernia was present.      The examined duodenum was normal. Impression:               - Linear areas of desquamation mucosa in the                            esophagus.                           -  Z-line regular, 38 cm from the incisors.                           - Congested, erythematous, eroded, inflamed and                            nodular mucosa in the antrum. Biopsied. Injected.                           - Small hiatal hernia.                           - Normal examined duodenum. Recommendation:           - Advance diet as tolerated and clear liquid diet.                           - Await pathology results.                           - Observe patient's clinical course.                           - Resume  Eliquis (apixaban) at prior dose tomorrow.                            Refer to managing physician for further adjustment                            of therapy.                           - Use Protonix (pantoprazole) 40 mg IV BID. Procedure Code(s):        --- Professional ---                           [phone], Esophagogastroduodenoscopy, flexible,                            transoral; with biopsy, single or multiple Diagnosis Code(s):        --- Professional ---                           R10.13, Epigastric pain                           Z87.11, Personal history of peptic ulcer disease                           K25.9, Gastric ulcer, unspecified as acute or                            chronic, without hemorrhage or perforation                           K29.70, Gastritis, unspecified, without bleeding  K31.89, Other diseases of stomach and duodenum                           K44.9, Diaphragmatic hernia without obstruction or                            gangrene CPT copyright 2019 [employer]. All rights reserved. The codes documented in this report are preliminary and upon coder review may  be revised to meet current compliance requirements. [name], MD 01/22/2019 11:55:32 AM This report has been signed electronically. Number of Addenda: 0

## 2019-01-22 NOTE — Interval H&P Note (Signed)
History and Physical Interval Note:  01/22/2019 11:04 AM  Stephanie Yates  has presented today for surgery, with the diagnosis of abdominal pain.  The various methods of treatment have been discussed with the patient and family. After consideration of risks, benefits and other options for treatment, the patient has consented to  Procedure(s): ESOPHAGOGASTRODUODENOSCOPY (EGD) WITH PROPOFOL (N/A) as a surgical intervention.  The patient's history has been reviewed, patient examined, no change in status, stable for surgery.  I have reviewed the patient's chart and labs.  Questions were answered to the patient's satisfaction.     [name]

## 2019-01-22 NOTE — Transfer of Care (Signed)
Immediate Anesthesia Transfer of Care Note  Patient: Stephanie Yates  Procedure(s) Performed: ESOPHAGOGASTRODUODENOSCOPY (EGD) WITH PROPOFOL (N/A ) SCLEROTHERAPY BIOPSY  Patient Location: Endoscopy Unit  Anesthesia Type:MAC  Level of Consciousness: awake, alert  and oriented  Airway & Oxygen Therapy: Patient Spontanous Breathing and Patient connected to nasal cannula oxygen  Post-op Assessment: Report given to RN and Post -op Vital signs reviewed and stable  Post vital signs: Reviewed and stable  Last Vitals:  Vitals Value Taken Time  BP 129/70 01/22/19 1146  Temp    Pulse 60 01/22/19 1146  Resp 25 01/22/19 1146  SpO2 97 % 01/22/19 1146    Last Pain:  Vitals:   01/22/19 1146  TempSrc:   PainSc: 7       Patients Stated Pain Goal: 0 (77/37/36 6815)  Complications: No apparent anesthesia complications

## 2019-01-22 NOTE — Progress Notes (Addendum)
Progress Note  Patient Name: QUINN BARTLING Date of Encounter: 01/22/2019  Primary Cardiologist: No primary care provider on file.   Subjective   Heart feels good NO nausea with meds C/o abdominal pain yesterday to Dr. Caryl Comes, mid epigastric, sub xyhphoid continues "not so bad today" No CP, palpitations or SOB Significant abd pain this am  Inpatient Medications    Scheduled Meds: . amiodarone  400 mg Oral BID  . atorvastatin  40 mg Oral q1800  . carvedilol  3.125 mg Oral BID WC  . Chlorhexidine Gluconate Cloth  6 each Topical Daily  . ferrous gluconate  324 mg Oral BID  . furosemide  80 mg Oral Daily  . hydrALAZINE  12.5 mg Oral TID  . isosorbide mononitrate  15 mg Oral Daily  . magnesium oxide  400 mg Oral Daily  . mexiletine  200 mg Oral Q12H  . pantoprazole  40 mg Oral BID AC   Continuous Infusions: . sodium chloride 20 mL/hr at 01/21/19 2101   PRN Meds: acetaminophen, levalbuterol, nitroGLYCERIN, zolpidem   Vital Signs    Vitals:   01/21/19 1158 01/21/19 1657 01/21/19 2258 01/22/19 0606  BP: (!) 105/56 118/64 (!) 104/54 121/90  Pulse: 60 60 (!) 59 (!) 59  Resp: 18 18 17 17   Temp: (!) 97.5 F (36.4 C)  98 F (36.7 C) 98.1 F (36.7 C)  TempSrc: Oral  Oral Oral  SpO2: 95%  91% 93%  Weight:    61.5 kg  Height:        Intake/Output Summary (Last 24 hours) at 01/22/2019 0926 Last data filed at 01/22/2019 9147 Gross per 24 hour  Intake 400 ml  Output 1200 ml  Net -800 ml   Filed Weights   01/20/19 0603 01/21/19 0151 01/22/19 0606  Weight: 61.8 kg 62.2 kg 61.5 kg    Telemetry    remains sr with atrial pacing, no VT - Personally Reviewed  ECG    No new EKGs - Personally Reviewed  Physical Exam   Well developed and nourished in no acute distress HENT normal Neck supple with JVP  Regular rate and rhythm, no murmurs or gallops Abd-soft with active BS  RUQ/epigastric tenderness remains, no rebound or gaurding No Clubbing cyanosis edema  Skin-warm and dry A & Oriented  Grossly normal sensory and motor function      Labs    Chemistry Recent Labs  Lab 01/17/19 0922 01/18/19 0340  NA 139 141  K 3.8 3.8  CL 107 105  CO2 23 25  GLUCOSE 111* 103*  BUN 38* 39*  CREATININE 1.82* 2.04*  CALCIUM 9.2 8.9  GFRNONAA 27* 23*  GFRAA 31* 27*  ANIONGAP 9 11     Hematology Recent Labs  Lab 01/17/19 0922  WBC 4.2  RBC 4.93  HGB 11.6*  HCT 36.8  MCV 74.6*  MCH 23.5*  MCHC 31.5  RDW 21.3*  PLT 233    Cardiac EnzymesNo results for input(s): TROPONINI in the last 168 hours. No results for input(s): TROPIPOC in the last 168 hours.   BNPNo results for input(s): BNP, PROBNP in the last 168 hours.   DDimer No results for input(s): DDIMER in the last 168 hours.   Radiology    No results found.  Cardiac Studies   none  Patient Profile     73 y.o. female admitted with VT storm. She has severe LV dysfunction but is euvolemic  Assessment & Plan    VT storm.  Device interrogation demonstrates at least 5 VT's spontaneously initiating ICD-Saint Jude Amio decreased to 400 bid yesterday, plan for  x 2 weeks then 400 daily   Nonischemic cardiomyopathy Compensated currently    IVCD QRS duration 134 3/20 First-degree AV block Device programmed on admission to promote intrinsic AV conduction, no V pacing currently   Hypotension  BP generally 90's-low 100 range, no room to titrate her BB     Renal insufficiency grade 3-4   Atrial fibrillation-persistent CHA2DS2Vasc is 4, on Eliquis (held currently for EGD)   Abdominal pain Onset prior to her hospital stay (?months) and mexilitine Appreciate GI help, planned for EGD today       [name], PA-C  01/22/2019, 9:26 AM  Patient ID: Gardiner Ramus, female   DOB: 07-Sep-1945, 73 y.o.   MRN: [id number]  As above   Await EGD results  VT quiet  Will downtitrate amiodarone

## 2019-01-23 ENCOUNTER — Inpatient Hospital Stay (HOSPITAL_COMMUNITY): Payer: Medicare Other

## 2019-01-23 LAB — CBC
HCT: 38.7 % (ref 36.0–46.0)
Hemoglobin: 12.6 g/dL (ref 12.0–15.0)
MCH: 24.3 pg — ABNORMAL LOW (ref 26.0–34.0)
MCHC: 32.6 g/dL (ref 30.0–36.0)
MCV: 74.6 fL — ABNORMAL LOW (ref 80.0–100.0)
Platelets: 218 10*3/uL (ref 150–400)
RBC: 5.19 MIL/uL — ABNORMAL HIGH (ref 3.87–5.11)
RDW: 22.4 % — ABNORMAL HIGH (ref 11.5–15.5)
WBC: 6.3 10*3/uL (ref 4.0–10.5)
nRBC: 0 % (ref 0.0–0.2)

## 2019-01-23 LAB — BASIC METABOLIC PANEL
Anion gap: 10 (ref 5–15)
BUN: 27 mg/dL — ABNORMAL HIGH (ref 8–23)
CO2: 25 mmol/L (ref 22–32)
Calcium: 9 mg/dL (ref 8.9–10.3)
Chloride: 103 mmol/L (ref 98–111)
Creatinine, Ser: 1.91 mg/dL — ABNORMAL HIGH (ref 0.44–1.00)
GFR calc Af Amer: 29 mL/min — ABNORMAL LOW (ref >60)
GFR calc non Af Amer: 25 mL/min — ABNORMAL LOW (ref >60)
Glucose, Bld: 95 mg/dL (ref 70–99)
Potassium: 3.6 mmol/L (ref 3.5–5.1)
Sodium: 138 mmol/L (ref 135–145)

## 2019-01-23 NOTE — Progress Notes (Addendum)
[facility] Progress Note  Stephanie Yates 73 y.o. [birth year]   Subjective: Sharp pain woke her up from sleep last night and then was aching all night. Nausea with it without vomiting. Denies rectal bleeding or BMs overnight.  Objective: Vital signs: Vitals:   01/23/19 0518 01/23/19 1139  BP: 112/65 (!) 119/56  Pulse: 60 60  Resp: 16 16  Temp: 97.9 F (36.6 C) 97.9 F (36.6 C)  SpO2: 96% 96%    Physical Exam: Gen: lethargic, elderly, no acute distress, pleasant HEENT: anicteric sclera CV: RRR Chest: CTA B Abd: epigastric tenderness with guarding, soft, nondistended, +BS Ext: no edema  Lab Results: No results for input(s): NA, K, CL, CO2, GLUCOSE, BUN, CREATININE, CALCIUM, MG, PHOS in the last 72 hours. No results for input(s): AST, ALT, ALKPHOS, BILITOT, PROT, ALBUMIN in the last 72 hours. No results for input(s): WBC, NEUTROABS, HGB, HCT, MCV, PLT in the last 72 hours.    Assessment/Plan: Epigastric pain with gastritis seen on EGD but pain out of proportion to EGD findings. Will do an abd/pelvis CT with IV contrast if radiology willing to give contrast. Continue clear liquid diet and await CT. Ok to resume Eliquis and will defer timing to Dr. Caryl Comes.   [name] 01/23/2019, 11:59 AM  Questions please call [phone] ID: Stephanie Yates, female   DOB: 08/29/1945, 73 y.o.   MRN: [id number]

## 2019-01-23 NOTE — Anesthesia Postprocedure Evaluation (Signed)
Anesthesia Post Note  Patient: Stephanie Yates  Procedure(s) Performed: ESOPHAGOGASTRODUODENOSCOPY (EGD) WITH PROPOFOL (N/A ) SCLEROTHERAPY BIOPSY     Patient location during evaluation: PACU Anesthesia Type: MAC Level of consciousness: awake and alert Pain management: pain level controlled Vital Signs Assessment: post-procedure vital signs reviewed and stable Respiratory status: spontaneous breathing, nonlabored ventilation, respiratory function stable and patient connected to nasal cannula oxygen Cardiovascular status: stable and blood pressure returned to baseline Postop Assessment: no apparent nausea or vomiting Anesthetic complications: no    Last Vitals:  Vitals:   01/23/19 0518 01/23/19 1139  BP: 112/65 (!) 119/56  Pulse: 60 60  Resp: 16 16  Temp: 36.6 C 36.6 C  SpO2: 96% 96%    Last Pain:  Vitals:   01/23/19 1600  TempSrc:   PainSc: 0-No pain                 Effie Berkshire

## 2019-01-23 NOTE — Progress Notes (Addendum)
Progress Note  Patient Name: ROSAMARIA DONN Date of Encounter: 01/23/2019  Primary Electrophysiologist: Dr. Caryl Comes  Subjective   Heart feels good No nausea with onset meds More nauseous today, c/o worse discomfort No CP, palpitations or SOB  Inpatient Medications    Scheduled Meds: . amiodarone  400 mg Oral BID  . atorvastatin  40 mg Oral q1800  . carvedilol  3.125 mg Oral BID WC  . Chlorhexidine Gluconate Cloth  6 each Topical Daily  . ferrous gluconate  324 mg Oral BID  . furosemide  80 mg Oral Daily  . hydrALAZINE  12.5 mg Oral TID  . isosorbide mononitrate  15 mg Oral Daily  . magnesium oxide  400 mg Oral Daily  . mexiletine  200 mg Oral Q12H  . pantoprazole (PROTONIX) IV  40 mg Intravenous Q12H   Continuous Infusions:  PRN Meds: acetaminophen, levalbuterol, nitroGLYCERIN, zolpidem   Vital Signs    Vitals:   01/22/19 1200 01/22/19 1307 01/22/19 2023 01/23/19 0518  BP: 127/81 120/67 115/62 112/65  Pulse: 60 60 60 60  Resp: 18  17 16   Temp: (!) 97.4 F (36.3 C) 98.1 F (36.7 C) 98.2 F (36.8 C) 97.9 F (36.6 C)  TempSrc: Oral Oral Oral Oral  SpO2: 94% 92% 93% 96%  Weight:    61.3 kg  Height:        Intake/Output Summary (Last 24 hours) at 01/23/2019 0750 Last data filed at 01/23/2019 0148 Gross per 24 hour  Intake 640 ml  Output 1900 ml  Net -1260 ml   Filed Weights   01/21/19 0151 01/22/19 0606 01/23/19 0518  Weight: 62.2 kg 61.5 kg 61.3 kg    Telemetry    remains sr with atrial pacing, no VT - Personally Reviewed  ECG    No new EKGs - Personally Reviewed  Physical Exam   Well developed and nourished in no acute distress HENT normal Neck supple with JVP  Regular rate and rhythm, no murmurs or gallops Abd-soft with active BS  Mild tenderness remains, no rebound or gaurding No Clubbing cyanosis edema Skin-warm and dry A & Oriented  Grossly normal sensory and motor function      Labs    Chemistry Recent Labs  Lab 01/17/19  0922 01/18/19 0340  NA 139 141  K 3.8 3.8  CL 107 105  CO2 23 25  GLUCOSE 111* 103*  BUN 38* 39*  CREATININE 1.82* 2.04*  CALCIUM 9.2 8.9  GFRNONAA 27* 23*  GFRAA 31* 27*  ANIONGAP 9 11     Hematology Recent Labs  Lab 01/17/19 0922  WBC 4.2  RBC 4.93  HGB 11.6*  HCT 36.8  MCV 74.6*  MCH 23.5*  MCHC 31.5  RDW 21.3*  PLT 233    Cardiac EnzymesNo results for input(s): TROPONINI in the last 168 hours. No results for input(s): TROPIPOC in the last 168 hours.   BNPNo results for input(s): BNP, PROBNP in the last 168 hours.   DDimer No results for input(s): DDIMER in the last 168 hours.   Radiology    No results found.  Cardiac Studies   none  Patient Profile     73 y.o. female admitted with VT storm. She has severe LV dysfunction but is euvolemic  Assessment & Plan    VT storm.   Device interrogation demonstrates at least 5 VT's spontaneously initiating ICD-Saint Jude Amio decreased to 400 bid, plan for  x 2 weeks then 400 daily None since here  Nonischemic cardiomyopathy Compensated currently    IVCD QRS duration 134 3/20 First-degree AV block Device programmed on admission to promote intrinsic AV conduction, no V pacing currently   Hypotension  BP generally 90's-low 100 range, no room to titrate her BB  stable   Renal insufficiency grade 3-4 Get BMET today  Atrial fibrillation-persistent CHA2DS2Vasc is 4, on Eliquis (held for EGD yesterday) Will wait on GI today She is maintaining SR (A paced)   Abdominal pain Onset prior to her hospital stay (?months) and mexiletine Nausea today she thinks is worse then it has been, ? Mexiletine.  Though onset of her GI symptoms were clearly prior to her hospital stay and addition of drug.  S/p EGD yesterday Pt more nauseous today, didn't eat dinner, doesn't want breakfast I will ask GI to revisit       [name], PA-C  01/23/2019, 7:50 AM  Patient ID: Gardiner Ramus, female    DOB: 1945/11/22, 73 y.o.   MRN: [id number]  As above  Called Dr Michail Sermon  He will see And await their clearance prior to discharge  Continue amio and mex for Vt  Resume apixoban when ok per GI

## 2019-01-24 ENCOUNTER — Other Ambulatory Visit: Payer: Self-pay | Admitting: Physician Assistant

## 2019-01-24 ENCOUNTER — Inpatient Hospital Stay (HOSPITAL_COMMUNITY): Payer: Medicare Other

## 2019-01-24 MED ORDER — AMIODARONE HCL 400 MG PO TABS: 400.0000 mg | ORAL_TABLET | Freq: Every day | ORAL | 0 refills | Status: DC

## 2019-01-24 MED ORDER — APIXABAN 5 MG PO TABS
5.0000 mg | ORAL_TABLET | Freq: Two times a day (BID) | ORAL | Status: DC
  Administered 2019-01-24: 5 mg via ORAL
  Filled 2019-01-24: qty 1

## 2019-01-24 MED ORDER — AMIODARONE HCL 200 MG PO TABS: 200.0000 mg | ORAL_TABLET | Freq: Every day | ORAL | 6 refills | Status: DC

## 2019-01-24 MED ORDER — MEXILETINE HCL 200 MG PO CAPS: 200.0000 mg | ORAL_CAPSULE | Freq: Two times a day (BID) | ORAL | 6 refills | Status: DC

## 2019-01-24 NOTE — Discharge Summary (Addendum)
DISCHARGE SUMMARY    Patient ID: Stephanie Yates,  MRN: [id number], DOB/AGE: 1945-11-03 73 y.o.  Admit date: 01/17/2019 Discharge date: 01/24/2019  Primary Care Physician: Josetta Huddle, MD  Primary Cardiologist/Electrophysiologist: Dr. Caryl Comes  Primary Discharge Diagnosis:  1. VT storm 2. Abdominal discomfort   Secondary Discharge Diagnosis:  1. NICM 2. Chronic CHF      Compensated currently 3. HTN 4. CKD (III) 5. Paroxysmal AFib     CHA2DS2Vasc is 4, on Eliquis, appropriately dosed   Allergies  Allergen Reactions  . Strawberry Extract Hives, Itching, Swelling and Other (See Comments)    Hives and itching      Procedures This Admission:  1. EGD, 01/22/2019, Dr. Michail Sermon Findings: The Z-line was regular and was found 38 cm from the incisors. Segmental severe mucosal changes characterized by congestion, erythema, erosion, inflammation and nodularity were found in the gastric antrum. Biopsies were taken with a cold forceps for histology. Estimated blood loss was minimal. Area was successfully injected with 3 mL of a 1:10,000 solution of epinephrine for hemostasis. Estimated blood loss was minimal. A small hiatal hernia was present. The examined duodenum was normal.     Brief HPI: Stephanie Yates is a 73 y.o. female was referred to the ER via the device clinic with recurrent VT and ICD therapies.  She has history of the same though noted  01/14/2019 our device clinic had an alert for VT episodes. VT-1 01/13/19 @ 2054 (24 second duration), max V rate 150bpm, ATP x1 successfully terminated. VT-1 on 01/12/19 @ 2143 (26 second duration), Max V rate 143. ATP x2 successfully terminated. These were asymptomatic and Dr. Caryl Comes ordered her amiodarone be increased to 400mg  BID and added ranexa 500mg  BID  TODAY Alert for VT event, ATP x4 & 1 30J shock at 1152; shock successfullyterminated episode. 3 additional ATP episodes, successfully terminated VT. 8NSVT eventsnoted as  well  The patient given VT storm was recommended to go to the ER for admission and management.  Labs unremarkable with known CKD, electrolytes   Hospital Course:  The patient was admitted started initially on amiodarone gtt, with no VT overnight transitioned to PO amiodarone and mexiletine was added.  She had no signs/symptoms of illness, was COVID negative, no signs/symptoms of fluid OL.  She was noted to be progressively V pacing more over her last few device interrogations, her device was reprogrammed to promote intrinsic AV conduction/reduce RV pacing.  Echo done earlier in the month not repeated with severe LV depression, EF 15%, similar to prior echos. She did well from an arrhythmia, cardiac standpoint, though did mention nausea/abdominal discomfort that pre-dated he admission and sounded somewhat chronic though was worse.  GI consulted EGD noted above.  She had persistent symptoms prompting a CT that noted no acuet findings.  BMET and CBC remained stable.   Today, day of discharge, she is feeling much better.  No ongoing nausea, no abdominal pain.  She has advanced and tolerated her diet this AM.  GI saw and cleared for Eliquis and discharge, recommend follow up with Dr. Therisa Doyne in 3-4 weeks.  I have discussed this with the patient, she will call and make follow up appointment.  She was seen and examined by Dr.Belynda Pagaduan and considered stable for discharge to home.  Will titrate her amiodarone Amio 400 bid x 1 week then 400 daily x 2 week then 200 daily Follow up with EP service is in place  She has requested am Rx  for the Ambien.  PMP is reviewed, appears she has had rx for [place] [facility]) via Dr. Inda Merlin. I have confirmed with her pharmacy that she has this Rx on file and a refill available to her, the patient seems to recall filling it the one time, can not recall why she is no longer on it/taking it.  She is instructed to discuss this with Dr. Inda Merlin.  The patient does not drive   Physical Exam:  Vitals:   01/23/19 1139 01/23/19 1943 01/24/19 0532 01/24/19 0945  BP: (!) 119/56 115/61 125/60 120/63  Pulse: 60 60 (!) 57 60  Resp: 16 17 16 18   Temp: 97.9 F (36.6 C) 98.1 F (36.7 C) 98.3 F (36.8 C)   TempSrc: Oral Oral Oral   SpO2: 96% 96% 90%   Weight:   61.2 kg   Height:        GEN- The patient is well appearing, alert and oriented x 3 today.   HEENT: normocephalic, atraumatic; sclera clear, conjunctiva pink; hearing intact; oropharynx clear; neck supple, no JVP Lungs- CTA b/l, normal work of breathing.  No wheezes, rales, rhonchi Heart- RRR, no murmurs, rubs or gallops, PMI not laterally displaced GI- soft, non-tender, non-distended, + BS all 4 quadrants Extremities- no clubbing, cyanosis, or edema MS- no significant deformity or atrophy Skin- warm and dry, no rash or lesioss Psych- euthymic mood, full affect Neuro- no gross deficits   Labs:   Lab Results  Component Value Date   WBC 6.3 01/23/2019   HGB 12.6 01/23/2019   HCT 38.7 01/23/2019   MCV 74.6 (L) 01/23/2019   PLT 218 01/23/2019    Recent Labs  Lab 01/23/19 1433  NA 138  K 3.6  CL 103  CO2 25  BUN 27*  CREATININE 1.91*  CALCIUM 9.0  GLUCOSE 95    Discharge Medications:  Allergies as of 01/24/2019      Reactions   Strawberry Extract Hives, Itching, Swelling, Other (See Comments)   Hives and itching      Medication List    STOP taking these medications   ranolazine 500 MG 12 hr tablet Commonly known as: Ranexa     TAKE these medications   acetaminophen 500 MG tablet Commonly known as: TYLENOL Take 1,000 mg by mouth every 6 (six) hours as needed for mild pain.   amiodarone 400 MG tablet Commonly known as: PACERONE Take 1 tablet (400 mg total) by mouth daily for 21 days. START by taking 1 tablets (400mg ) twice daily for 1 week, then reduce to 1 tablets (400mg ) once daily for 2 weeks What changed:   medication strength  when to take this  additional instructions Notes to  patient: You will follow this prescription to start, this will finish 02/14/2019   amiodarone 200 MG tablet Commonly known as: Pacerone Take 1 tablet (200 mg total) by mouth daily. Start taking on: February 15, 2019 What changed: You were already taking a medication with the same name, and this prescription was added. Make sure you understand how and when to take each. Notes to patient: You will start this prescription 02/15/2019   apixaban 5 MG Tabs tablet Commonly known as: Eliquis Take 1 tablet (5 mg total) by mouth 2 (two) times daily.   atorvastatin 40 MG tablet Commonly known as: LIPITOR Take 1 tablet (40 mg total) by mouth daily at 6 PM.   carvedilol 3.125 MG tablet Commonly known as: COREG Take 1 tablet (3.125 mg total) by mouth 2 (two)  times daily with a meal.   ferrous gluconate 324 MG tablet Commonly known as: FERGON Take 324 mg by mouth 2 (two) times daily.   furosemide 80 MG tablet Commonly known as: LASIX Take 1 tablet (80 mg total) by mouth daily.   hydrALAZINE 25 MG tablet Commonly known as: APRESOLINE Take 0.5 tablets (12.5 mg total) by mouth 3 (three) times daily.   isosorbide mononitrate 30 MG 24 hr tablet Commonly known as: IMDUR Take 0.5 tablets (15 mg total) by mouth daily.   levalbuterol 45 MCG/ACT inhaler Commonly known as: XOPENEX HFA Inhale 1 puff into the lungs every 4 (four) hours as needed for wheezing.   magnesium oxide 400 (241.3 Mg) MG tablet Commonly known as: MAG-OX Take 1 tablet (400 mg total) by mouth daily.   metolazone 2.5 MG tablet Commonly known as: ZAROXOLYN Take 1 tablet (2.5 mg total) by mouth daily as needed (leg swellings or shortness of breath or weight gain>5 pounds in 48 hours).   mexiletine 200 MG capsule Commonly known as: MEXITIL Take 1 capsule (200 mg total) by mouth 2 (two) times daily.   nitroGLYCERIN 0.4 MG SL tablet Commonly known as: NITROSTAT Place 1 tablet (0.4 mg total) under the tongue every 5 (five)  minutes as needed for chest pain.   pantoprazole 40 MG tablet Commonly known as: PROTONIX Take 1 tablet (40 mg total) by mouth 2 (two) times daily before a meal.   VITAMIN D-3 PO Take 1,000 Units by mouth at bedtime.       Disposition:  Home Discharge Instructions    Diet - low sodium heart healthy   Complete by: As directed    Increase activity slowly   Complete by: As directed      Follow-up Information    Ronnette Juniper, MD. Call in 3 week(s).   Specialty: Gastroenterology Why: Call to make a follow up appointment for 3-4 weeks after discharge Contact information: [place] [address] [phone]        Patsey Berthold, NP Follow up.   Specialty: Cardiology Why: 02/14/2019 @ 12:00PM (noon), for Dr. Margurite Auerbach information: [place] [address] [phone]        Josetta Huddle, MD. Call.   Specialty: Internal Medicine Why: please call and make an appointment/discuss treament and prescription for your insomnia as we discussed Contact information: [address] [place] [address] [phone]        Deboraha Sprang, MD .   Specialty: Cardiology Contact information: [phone] [place] [address] [phone]           Duration of Discharge Encounter: Greater than 30 minutes including physician time.  Signed, Tommye Standard, PA-C 01/24/2019 1:59 PM  Admitted with VT storm and treated with amiodarone w suppression Hospitalilzation complicated by GI discomfort and seen by GI--symptoms resolved

## 2019-01-24 NOTE — Progress Notes (Addendum)
Progress Note  Patient Name: Stephanie Yates Date of Encounter: 01/24/2019  Primary Electrophysiologist: Dr. Caryl Comes  Subjective   Feels much better today, no onging nausea, no belly pain.  No Cp or SOB.  She has been OOB to chair ambulating some in room without difficulty  Inpatient Medications    Scheduled Meds:  amiodarone  400 mg Oral BID   atorvastatin  40 mg Oral q1800   carvedilol  3.125 mg Oral BID WC   Chlorhexidine Gluconate Cloth  6 each Topical Daily   ferrous gluconate  324 mg Oral BID   furosemide  80 mg Oral Daily   hydrALAZINE  12.5 mg Oral TID   isosorbide mononitrate  15 mg Oral Daily   magnesium oxide  400 mg Oral Daily   mexiletine  200 mg Oral Q12H   pantoprazole (PROTONIX) IV  40 mg Intravenous Q12H   Continuous Infusions:  PRN Meds: acetaminophen, levalbuterol, nitroGLYCERIN, zolpidem   Vital Signs    Vitals:   01/23/19 0518 01/23/19 1139 01/23/19 1943 01/24/19 0532  BP: 112/65 (!) 119/56 115/61 125/60  Pulse: 60 60 60 (!) 57  Resp: 16 16 17 16   Temp: 97.9 F (36.6 C) 97.9 F (36.6 C) 98.1 F (36.7 C) 98.3 F (36.8 C)  TempSrc: Oral Oral Oral Oral  SpO2: 96% 96% 96% 90%  Weight: 61.3 kg   61.2 kg  Height:        Intake/Output Summary (Last 24 hours) at 01/24/2019 0943 Last data filed at 01/23/2019 2130 Gross per 24 hour  Intake 720 ml  Output 700 ml  Net 20 ml   Filed Weights   01/22/19 0606 01/23/19 0518 01/24/19 0532  Weight: 61.5 kg 61.3 kg 61.2 kg    Telemetry    remains sr with atrial pacing, no VT - Personally Reviewed  ECG    No new EKGs - Personally Reviewed  Physical Exam   Well developed and nourished in no acute distress HENT normal Neck supple with JVP  Regular rate and rhythm, no murmurs or gallops Abd-soft with active BS  No tenderness No Clubbing cyanosis edema Skin-warm and dry A & Oriented  Grossly normal sensory and motor function      Labs    Chemistry Recent Labs  Lab  01/18/19 0340 01/23/19 1433  NA 141 138  K 3.8 3.6  CL 105 103  CO2 25 25  GLUCOSE 103* 95  BUN 39* 27*  CREATININE 2.04* 1.91*  CALCIUM 8.9 9.0  GFRNONAA 23* 25*  GFRAA 27* 29*  ANIONGAP 11 10     Hematology Recent Labs  Lab 01/23/19 1433  WBC 6.3  RBC 5.19*  HGB 12.6  HCT 38.7  MCV 74.6*  MCH 24.3*  MCHC 32.6  RDW 22.4*  PLT 218    Cardiac EnzymesNo results for input(s): TROPONINI in the last 168 hours. No results for input(s): TROPIPOC in the last 168 hours.   BNPNo results for input(s): BNP, PROBNP in the last 168 hours.   DDimer No results for input(s): DDIMER in the last 168 hours.   Radiology    Ct Abdomen Pelvis Wo Contrast Result Date: 01/23/2019 CLINICAL DATA:  Abdominal pain, nausea/vomiting EXAM: CT ABDOMEN AND PELVIS WITHOUT CONTRAST TECHNIQUE: Multidetector CT imaging of the abdomen and pelvis was performed following the standard protocol without IV contrast. COMPARISON:  CT abdomen dated 07/12/2017 FINDINGS: Lower chest: Mild atelectasis at the bilateral lung bases. Cardiomegaly.  ICD leads, incompletely visualized. Hepatobiliary: Mildly nodular  hepatic contour, suggesting cirrhosis. Cholelithiasis, without associated inflammatory changes. No intrahepatic or extrahepatic ductal dilatation. Pancreas: Within normal limits. Spleen: Within normal limits. Adrenals/Urinary Tract: 3.8 cm low-density right adrenal nodule (series 3/image 21), likely reflecting a benign adrenal adenoma. Left adrenal glands are within normal limits. 3 mm nonobstructing left lower pole renal calculus (series 3/image 35). 5 mm nonobstructing right lower pole renal calculus (series 3/image 38). No hydronephrosis. Bladder is within normal limits. Stomach/Bowel: Stomach is within normal limits. No evidence of bowel obstruction. Normal appendix (coronal image 40). Extensive colonic diverticulosis, without evidence of diverticulitis. Vascular/Lymphatic: No evidence of abdominal aortic aneurysm.  Atherosclerotic calcifications of the abdominal aorta and branch vessels. No suspicious abdominopelvic lymphadenopathy. Reproductive: Uterine fibroids. Bilateral ovaries are unremarkable. Other: No abdominopelvic ascites. Small right inguinal hernia containing a loop of nondilated small bowel (series 3/image 33). Musculoskeletal: Mild degenerative changes of the visualized thoracolumbar spine. Grade 1 anterolisthesis at L5-S1. IMPRESSION: No evidence of bowel obstruction. Normal appendix. Extensive colonic diverticulosis, without evidence of diverticulitis. Bilateral nonobstructing renal calculi, measuring up to 5 mm. No hydronephrosis. Small right inguinal hernia containing a loop of nondilated small bowel. Cirrhosis. Cholelithiasis. Benign right adrenal adenoma. Uterine fibroids. Electronically Signed   By: Julian Hy M.D.   On: 01/23/2019 21:28    Cardiac Studies   none  Patient Profile     73 y.o. female admitted with VT storm. She has severe LV dysfunction but is euvolemic  Assessment & Plan    VT storm.   Device interrogation demonstrates at least 5 VT's spontaneously initiating ICD-[employer] Amio decreased to 400 bid, plan for  x 2 weeks then 400 daily None since here   Nonischemic cardiomyopathy Compensated currently    IVCD QRS duration 134 3/20 First-degree AV block Device programmed on admission to promote intrinsic AV conduction, no V pacing currently   Hypotension  BP generally 90's-low 110 range, no room to titrate her BB  stable   Renal insufficiency grade 3-4 stable  Atrial fibrillation-persistent CHA2DS2Vasc is 4, on Eliquis (held for EGD yesterday) H/H stable She remains in SR (A paced) Will resume today   Abdominal pain Onset prior to her hospital stay (?months) and mexiletine Nausea today she thinks is worse then it has been, ? Mexiletine.  Though onset of her GI symptoms were clearly prior to her hospital stay and addition of  drug.  S/p EGD 01/22/2019 Antral nodular inflammation with one nodule that was oozing blood and was also very friable. Biopsies taken and epinephrine:saline 1:10,000 U injected with hemostasis achieved.  Appreciate GI help CT without significant/acute findings She feels much improved this AM, tolerated clear dinner and had some clear breakfast this Am, await GI to advance diet, though suspect will for lunch   Anticipate discharge this afternoon, after Dr. Michail Sermon and Dr. Caryl Comes sees her if tolerates lunch     [name] Dyane Dustman, [place]  01/24/2019, 9:43 AM  Patient ID: Stephanie Yates, female   DOB: April 27, 1946, 73 y.o.   MRN: [id number]   As above   appricate help from [facility] for discharge today Amio 400 bid x 1 week then 400 daily x 2 week then 200 daily Continue mexiletine 200 bid

## 2019-01-24 NOTE — TOC Transition Note (Signed)
Transition of Care [facility]) - CM/SW Discharge Note   Patient Details  Name: Stephanie Yates MRN: [id number] Date of Birth: 1945-12-15  [facility] of [facility]) CM/SW Contact:  Zenon Mayo, RN Phone Number: 01/24/2019, 2:32 PM   Clinical Narrative:    Patient for dc today, he has a scale at the home per previous NCM note.  He has no needs.    Final next level of care: Home/Self Care Barriers to Discharge: No Barriers Identified   Patient Goals and CMS Choice Patient states their goals for this hospitalization and ongoing recovery are:: get better   Choice offered to / list presented to : NA  Discharge Placement                       Discharge Plan and Services                DME Arranged: (NA)         HH Arranged: NA          Social Determinants of Health (SDOH) Interventions     Readmission Risk Interventions Readmission Risk Prevention Plan 01/08/2019 09/05/2018  Transportation Screening - Complete  PCP or Specialist Appt within 3-5 Days - Complete  HRI or [place] Patient refused Complete  Social Work Consult for [place] Planning/Counseling - Complete  Palliative Care Screening Not Applicable Not Applicable  Medication Review [employer] photographer) - Not Complete  Med Review Comments - pt not ready for dc/ rn to complete when pt is ready for dc  Some recent data might be hidden

## 2019-01-24 NOTE — Care Management Important Message (Signed)
Important Message  Patient Details  Name: Stephanie Yates MRN: [id number] Date of Birth: [birth year]   Medicare Important Message Given:  Yes     Shelda Altes 01/24/2019, 12:35 PM

## 2019-01-24 NOTE — Progress Notes (Signed)
[facility] Progress Note  Stephanie Yates 73 y.o. [birth year]   Subjective: Feels good. Denies abdominal pain. Tolerating clears. Hungry.  Objective: Vital signs: Vitals:   01/24/19 0532 01/24/19 0945  BP: 125/60 120/63  Pulse: (!) 57 60  Resp: 16 18  Temp: 98.3 F (36.8 C)   SpO2: 90%     Physical Exam: Gen: alert, no acute distress, elderly, pleasant HEENT: anicteric sclera CV: RRR Chest: CTA B Abd: soft, nontender, nondistended, +BS Ext: no edema  Lab Results: Recent Labs    01/23/19 1433  NA 138  K 3.6  CL 103  CO2 25  GLUCOSE 95  BUN 27*  CREATININE 1.91*  CALCIUM 9.0   No results for input(s): AST, ALT, ALKPHOS, BILITOT, PROT, ALBUMIN in the last 72 hours. Recent Labs    01/23/19 1433  WBC 6.3  HGB 12.6  HCT 38.7  MCV 74.6*  PLT 218      Assessment/Plan: Epigastric pain with gastritis (biopsies negative for H. Pylori). Noncontrast CT showed cirrhosis but no acute changes. Question vascular source for abd pain such as chronic mesenteric ischemia. No additional GI studies needed at this time. Changed diet to soft diet. If tolerates diet, then ok to go home from GI standpoint. F/U with Dr. Therisa Doyne in 3-4 weeks.   [name] 01/24/2019, 10:22 AM  Questions please call [phone] ID: Stephanie Yates, female   DOB: [birth year], 73 y.o.   MRN: [id number]

## 2019-01-27 ENCOUNTER — Inpatient Hospital Stay (HOSPITAL_COMMUNITY)
Admission: EM | Admit: 2019-01-27 | Discharge: 2019-01-30 | DRG: 392 | Disposition: A | Discharge status: Home / Self Care | Payer: Medicare Other | Attending: Family Medicine | Admitting: Family Medicine

## 2019-01-27 ENCOUNTER — Other Ambulatory Visit: Payer: Self-pay

## 2019-01-27 ENCOUNTER — Encounter (HOSPITAL_COMMUNITY): Payer: Self-pay | Admitting: Emergency Medicine

## 2019-01-27 DIAGNOSIS — R0902 Hypoxemia: Secondary | ICD-10-CM | POA: Diagnosis present

## 2019-01-27 DIAGNOSIS — G8929 Other chronic pain: Secondary | ICD-10-CM | POA: Diagnosis present

## 2019-01-27 DIAGNOSIS — I428 Other cardiomyopathies: Secondary | ICD-10-CM | POA: Diagnosis present

## 2019-01-27 DIAGNOSIS — N184 Chronic kidney disease, stage 4 (severe): Secondary | ICD-10-CM

## 2019-01-27 DIAGNOSIS — K8020 Calculus of gallbladder without cholecystitis without obstruction: Secondary | ICD-10-CM | POA: Diagnosis present

## 2019-01-27 DIAGNOSIS — K2970 Gastritis, unspecified, without bleeding: Principal | ICD-10-CM

## 2019-01-27 DIAGNOSIS — I130 Hypertensive heart and chronic kidney disease with heart failure and stage 1 through stage 4 chronic kidney disease, or unspecified chronic kidney disease: Secondary | ICD-10-CM | POA: Diagnosis present

## 2019-01-27 DIAGNOSIS — Z7901 Long term (current) use of anticoagulants: Secondary | ICD-10-CM

## 2019-01-27 DIAGNOSIS — Z87891 Personal history of nicotine dependence: Secondary | ICD-10-CM

## 2019-01-27 DIAGNOSIS — Z79899 Other long term (current) drug therapy: Secondary | ICD-10-CM

## 2019-01-27 DIAGNOSIS — I480 Paroxysmal atrial fibrillation: Secondary | ICD-10-CM | POA: Diagnosis present

## 2019-01-27 DIAGNOSIS — R1084 Generalized abdominal pain: Secondary | ICD-10-CM

## 2019-01-27 DIAGNOSIS — I340 Nonrheumatic mitral (valve) insufficiency: Secondary | ICD-10-CM | POA: Diagnosis present

## 2019-01-27 DIAGNOSIS — R1013 Epigastric pain: Secondary | ICD-10-CM | POA: Diagnosis present

## 2019-01-27 DIAGNOSIS — I10 Essential (primary) hypertension: Secondary | ICD-10-CM | POA: Diagnosis present

## 2019-01-27 DIAGNOSIS — D631 Anemia in chronic kidney disease: Secondary | ICD-10-CM | POA: Diagnosis present

## 2019-01-27 DIAGNOSIS — Z20828 Contact with and (suspected) exposure to other viral communicable diseases: Secondary | ICD-10-CM | POA: Diagnosis present

## 2019-01-27 DIAGNOSIS — Z515 Encounter for palliative care: Secondary | ICD-10-CM | POA: Diagnosis present

## 2019-01-27 DIAGNOSIS — E785 Hyperlipidemia, unspecified: Secondary | ICD-10-CM | POA: Diagnosis present

## 2019-01-27 DIAGNOSIS — I5084 End stage heart failure: Secondary | ICD-10-CM | POA: Diagnosis present

## 2019-01-27 DIAGNOSIS — R112 Nausea with vomiting, unspecified: Secondary | ICD-10-CM

## 2019-01-27 DIAGNOSIS — I5022 Chronic systolic (congestive) heart failure: Secondary | ICD-10-CM | POA: Diagnosis present

## 2019-01-27 DIAGNOSIS — Z809 Family history of malignant neoplasm, unspecified: Secondary | ICD-10-CM

## 2019-01-27 MED ORDER — ALUM & MAG HYDROXIDE-SIMETH 200-200-20 MG/5ML PO SUSP
30.0000 mL | Freq: Once | ORAL | Status: AC
  Administered 2019-01-28: 30 mL via ORAL
  Filled 2019-01-27: qty 30

## 2019-01-27 MED ORDER — SODIUM CHLORIDE 0.9% FLUSH
3.0000 mL | Freq: Once | INTRAVENOUS | Status: AC
  Administered 2019-01-28: 3 mL via INTRAVENOUS

## 2019-01-27 MED ORDER — LIDOCAINE VISCOUS HCL 2 % MT SOLN
15.0000 mL | Freq: Once | OROMUCOSAL | Status: AC
  Administered 2019-01-28: 15 mL via ORAL
  Filled 2019-01-27: qty 15

## 2019-01-27 MED ORDER — SODIUM CHLORIDE 0.9 % IV BOLUS
1000.0000 mL | Freq: Once | INTRAVENOUS | Status: AC
  Administered 2019-01-28: 1000 mL via INTRAVENOUS

## 2019-01-27 NOTE — ED Triage Notes (Signed)
Pt BIB [facility] c/o central abdominal pain starting yesterday afternoon. Rates pain 9 out of 10. Pt had endoscopy Wednesday d/t ulcer. Pt denies nausea at this time.   [facility]  BP 150/73 HR 62 SpO2 97 RA T 98.1

## 2019-01-27 NOTE — ED Provider Notes (Signed)
[facility] EMERGENCY DEPARTMENT Provider Note   CSN: [id number] Arrival date & time: 01/27/19  2321     History   Chief Complaint Chief Complaint  Patient presents with  . Abdominal Pain    HPI Stephanie Yates is a 73 y.o. female.     Patient is a 73 year old female with past medical history of nonischemic cardiomyopathy with pacemaker/defibrillator, chronic renal insufficiency, CHF, and recent admission for palpitations.  She also experienced abdominal pain during this prior admission and underwent endoscopy.  This showed inflammation within the mucosa of the gastric antrum.  Patient was discharged on Protonix which she states she has been taking.  She presents this evening with complaints of epigastric pain.  This started yesterday and is worsening.  Her pain is constant and there are no aggravating or alleviating factors.  She denies fevers or chills.  She denies vomiting, diarrhea, or bloody stools.   The history is provided by the patient.  Abdominal Pain Pain location:  Epigastric Pain quality: cramping and stabbing   Pain radiates to:  Does not radiate Pain severity:  Moderate Onset quality:  Sudden Duration:  24 hours Timing:  Constant Progression:  Worsening Chronicity:  New Relieved by:  Nothing Worsened by:  Nothing Ineffective treatments:  None tried Associated symptoms: nausea   Associated symptoms: no constipation, no fever and no vomiting     Past Medical History:  Diagnosis Date  . Chronic anemia   . Chronic systolic CHF (congestive heart failure) ([place])   . CKD (chronic kidney disease), stage IV ([place])   . Former tobacco use   . History of tobacco abuse   . Hyperlipidemia   . Hypertension   . Mitral regurgitation   . Nonischemic cardiomyopathy ([place])    a. EF 10-15% on 11/2012 cath with no significant CAD. b. EF 25% in 2019. b. EF 15% in 12/2018  . PAF (paroxysmal atrial fibrillation) ([place])   . Protein calorie malnutrition ([place])   .  Quit consuming alcohol in remote past   . Sinus bradycardia 12/21/2012  . Ventricular tachycardia ([place]) 12/14/2012    Patient Active Problem List   Diagnosis Date Noted  . Epigastric abdominal pain 01/22/2019  . VT (ventricular tachycardia) ([place]) 01/17/2019  . AKI (acute kidney injury) ([place]) 01/12/2019  . Palliative care encounter   . CHF exacerbation ([place]) 01/06/2019  . Influenza A 09/02/2018  . Lactic acidosis 09/02/2018  . Chronic combined systolic and diastolic congestive heart failure ([place]) 09/02/2018  . Rapid atrial fibrillation ([place]) 06/08/2018  . Acute on chronic combined systolic and diastolic heart failure ([place]) 06/07/2018  . Anemia 06/07/2018  . Acute GI bleeding 06/15/2017  . Chronic anticoagulation   . Acute on chronic congestive heart failure ([place])   . HLD (hyperlipidemia) 03/05/2017  . CKD (chronic kidney disease), stage III ([place]) 03/05/2017  . Chest pain 03/05/2017  . Symptomatic anemia 03/05/2017  . PAF (paroxysmal atrial fibrillation) ([place]) 04/01/2013  . ICD (implantable cardioverter-defibrillator), dual, st Judes 04/01/2013  . PVC's (premature ventricular contractions) 04/01/2013  . Hypokalemia 12/21/2012  . History of tobacco abuse 12/21/2012  . Elevated troponin 12/21/2012  . Sinus bradycardia 12/21/2012  . Hyperkalemia 12/20/2012  . Nonischemic cardiomyopathy ([place]) 12/20/2012  . UTI (urinary tract infection) 12/20/2012  . Acute on chronic combined systolic and diastolic CHF (congestive heart failure) ([place]) 12/20/2012  . Ventricular tachycardia ([place]) 12/14/2012  . Hypertension 12/14/2012    Past Surgical History:  Procedure Laterality Date  . BIOPSY  01/22/2019  Procedure: BIOPSY;  Surgeon: Wilford Corner, MD;  Location: [facility] ENDOSCOPY;  Service: Endoscopy;;  . ESOPHAGOGASTRODUODENOSCOPY (EGD) WITH PROPOFOL N/A 06/18/2017   Procedure: ESOPHAGOGASTRODUODENOSCOPY (EGD) WITH PROPOFOL;  Surgeon: Ronnette Juniper, MD;  Location: [facility];  Service:  Gastroenterology;  Laterality: N/A;  . ESOPHAGOGASTRODUODENOSCOPY (EGD) WITH PROPOFOL N/A 01/22/2019   Procedure: ESOPHAGOGASTRODUODENOSCOPY (EGD) WITH PROPOFOL;  Surgeon: Wilford Corner, MD;  Location: [facility];  Service: Endoscopy;  Laterality: N/A;  . IMPLANTABLE CARDIOVERTER DEFIBRILLATOR IMPLANT  12/19/2012   St. Jude dual-chamber ICD, serial number [id number]  . IMPLANTABLE CARDIOVERTER DEFIBRILLATOR IMPLANT N/A 12/19/2012   Procedure: IMPLANTABLE CARDIOVERTER DEFIBRILLATOR IMPLANT;  Surgeon: Deboraha Sprang, MD;  Location: [facility] CATH LAB;  Service: Cardiovascular;  Laterality: N/A;  . LEFT AND RIGHT HEART CATHETERIZATION WITH CORONARY ANGIOGRAM N/A 12/17/2012   Procedure: LEFT AND RIGHT HEART CATHETERIZATION WITH CORONARY ANGIOGRAM;  Surgeon: Sherren Mocha, MD;  Location: [facility] CATH LAB;  Service: Cardiovascular;  Laterality: N/A;  . SCLEROTHERAPY  01/22/2019   Procedure: SCLEROTHERAPY;  Surgeon: Wilford Corner, MD;  Location: [facility] ENDOSCOPY;  Service: Endoscopy;;     OB History   No obstetric history on file.      Home Medications    Prior to Admission medications   Medication Sig Start Date End Date Taking? Authorizing Provider  acetaminophen (TYLENOL) 500 MG tablet Take 1,000 mg by mouth every 6 (six) hours as needed for mild pain.    [provider]  amiodarone (PACERONE) 200 MG tablet Take 1 tablet (200 mg total) by mouth daily. 02/15/19   [name], PA-C  amiodarone (PACERONE) 400 MG tablet Take 1 tablet (400 mg total) by mouth daily for 21 days. START by taking 1 tablets (400mg ) twice daily for 1 week, then reduce to 1 tablets (400mg ) once daily for 2 weeks 01/24/19 02/14/19  [name], PA-C  apixaban (ELIQUIS) 5 MG TABS tablet Take 1 tablet (5 mg total) by mouth 2 (two) times daily. 01/12/19 02/11/19  [place] Shi, MD  atorvastatin (LIPITOR) 40 MG tablet Take 1 tablet (40 mg total) by mouth daily at 6 PM. 07/05/18   Deboraha Sprang, MD  carvedilol (COREG)  3.125 MG tablet Take 1 tablet (3.125 mg total) by mouth 2 (two) times daily with a meal. 09/12/18   Deboraha Sprang, MD  Cholecalciferol (VITAMIN D-3 PO) Take 1,000 Units by mouth at bedtime.     [provider]  ferrous gluconate (FERGON) 324 MG tablet Take 324 mg by mouth 2 (two) times daily.  07/05/18   [provider]  furosemide (LASIX) 80 MG tablet Take 1 tablet (80 mg total) by mouth daily. 07/05/18   Deboraha Sprang, MD  hydrALAZINE (APRESOLINE) 25 MG tablet Take 0.5 tablets (12.5 mg total) by mouth 3 (three) times daily. 01/12/19   Guilford Shi, MD  isosorbide mononitrate (IMDUR) 30 MG 24 hr tablet Take 0.5 tablets (15 mg total) by mouth daily. 01/13/19   Guilford Shi, MD  levalbuterol (XOPENEX HFA) 45 MCG/ACT inhaler Inhale 1 puff into the lungs every 4 (four) hours as needed for wheezing. 01/12/19 01/12/20  Guilford Shi, MD  magnesium oxide (MAG-OX) 400 (241.3 Mg) MG tablet Take 1 tablet (400 mg total) by mouth daily. 07/17/18   Deboraha Sprang, MD  metolazone (ZAROXOLYN) 2.5 MG tablet Take 1 tablet (2.5 mg total) by mouth daily as needed (leg swellings or shortness of breath or weight gain>5 pounds in 48 hours). 01/12/19   Guilford Shi, MD  mexiletine (MEXITIL) 200  MG capsule Take 1 capsule (200 mg total) by mouth 2 (two) times daily. 01/24/19   [name], PA-C  nitroGLYCERIN (NITROSTAT) 0.4 MG SL tablet Place 1 tablet (0.4 mg total) under the tongue every 5 (five) minutes as needed for chest pain. 01/12/19   Guilford Shi, MD  pantoprazole (PROTONIX) 40 MG tablet Take 1 tablet (40 mg total) by mouth 2 (two) times daily before a meal. 06/18/17   Lavina Hamman, MD    Family History Family History  Problem Relation Age of Onset  . Cancer Mother     Social History Social History   Tobacco Use  . Smoking status: Former Research scientist (life sciences)  . Smokeless tobacco: Never Used  Substance Use Topics  . Alcohol use: No  . Drug use: No     Allergies    Strawberry extract   Review of Systems Review of Systems  Constitutional: Negative for fever.  Gastrointestinal: Positive for abdominal pain and nausea. Negative for constipation and vomiting.  All other systems reviewed and are negative.    Physical Exam Updated Vital Signs BP 140/66   Pulse 65   Temp 98.2 F (36.8 C) (Oral)   Resp 16   Ht 5\' 5"  (1.651 m)   Wt 64.9 kg   SpO2 90%   BMI 23.80 kg/m   Physical Exam Vitals signs and nursing note reviewed.  Constitutional:      General: She is not in acute distress.    Appearance: She is well-developed. She is not diaphoretic.  HENT:     Head: Normocephalic and atraumatic.  Neck:     Musculoskeletal: Normal range of motion and neck supple.  Cardiovascular:     Rate and Rhythm: Normal rate and regular rhythm.     Heart sounds: No murmur. No friction rub. No gallop.   Pulmonary:     Effort: Pulmonary effort is normal. No respiratory distress.     Breath sounds: Normal breath sounds. No wheezing.  Abdominal:     General: Bowel sounds are normal. There is no distension.     Palpations: Abdomen is soft.     Tenderness: There is abdominal tenderness in the epigastric area. There is no right CVA tenderness, left CVA tenderness, guarding or rebound.  Musculoskeletal: Normal range of motion.  Skin:    General: Skin is warm and dry.  Neurological:     Mental Status: She is alert and oriented to person, place, and time.      ED Treatments / Results  Labs (all labs ordered are listed, but only abnormal results are displayed) Labs Reviewed  LIPASE, BLOOD  COMPREHENSIVE METABOLIC PANEL  CBC  URINALYSIS, ROUTINE W REFLEX MICROSCOPIC    EKG None  Radiology No results found.  Procedures Procedures (including critical care time)  Medications Ordered in ED Medications  sodium chloride flush (NS) 0.9 % injection 3 mL (has no administration in time range)  sodium chloride 0.9 % bolus 1,000 mL (has no administration in  time range)  alum & mag hydroxide-simeth (MAALOX/MYLANTA) 200-200-20 MG/5ML suspension 30 mL (has no administration in time range)    And  lidocaine (XYLOCAINE) 2 % viscous mouth solution 15 mL (has no administration in time range)     Initial Impression / Assessment and Plan / ED Course  I have reviewed the triage vital signs and the nursing notes.  Pertinent labs & imaging results that were available during my care of the patient were reviewed by me and considered in my medical  decision making (see chart for details).  Patient with history of recently diagnosed gastritis/inflammation of the gastric mucosa presenting with complaints of abdominal pain and vomiting.  Patient is taking Protonix at home, however this appears to not be helping.  Patient given multiple doses of antiemetics and pain medication as well as a GI cocktail, but is not feeling better.  Laboratory studies are essentially consistent with baseline.  CT scan today shows no acute intra-abdominal pathology.  She will be admitted to the hospitalist service for further treatment.  Dr. Marlowe Sax agrees to admit.  Final Clinical Impressions(s) / ED Diagnoses   Final diagnoses:  None    ED Discharge Orders    None       Veryl Speak, MD 01/28/19 [id number]

## 2019-01-28 ENCOUNTER — Emergency Department (HOSPITAL_COMMUNITY): Payer: Medicare Other

## 2019-01-28 DIAGNOSIS — N184 Chronic kidney disease, stage 4 (severe): Secondary | ICD-10-CM

## 2019-01-28 DIAGNOSIS — K2970 Gastritis, unspecified, without bleeding: Secondary | ICD-10-CM

## 2019-01-28 LAB — URINALYSIS, ROUTINE W REFLEX MICROSCOPIC
Bilirubin Urine: NEGATIVE
Glucose, UA: NEGATIVE mg/dL
Ketones, ur: NEGATIVE mg/dL
Leukocytes,Ua: NEGATIVE
Nitrite: NEGATIVE
Protein, ur: 30 mg/dL — AB
RBC / HPF: >50 RBC/hpf — ABNORMAL HIGH (ref 0–5)
Specific Gravity, Urine: 1.015 (ref 1.005–1.030)
pH: 6 (ref 5.0–8.0)

## 2019-01-28 LAB — CBC
HCT: 37.7 % (ref 36.0–46.0)
Hemoglobin: 11.9 g/dL — ABNORMAL LOW (ref 12.0–15.0)
MCH: 24.5 pg — ABNORMAL LOW (ref 26.0–34.0)
MCHC: 31.6 g/dL (ref 30.0–36.0)
MCV: 77.6 fL — ABNORMAL LOW (ref 80.0–100.0)
Platelets: 189 10*3/uL (ref 150–400)
RBC: 4.86 MIL/uL (ref 3.87–5.11)
RDW: 23.2 % — ABNORMAL HIGH (ref 11.5–15.5)
WBC: 5.5 10*3/uL (ref 4.0–10.5)
nRBC: 0 % (ref 0.0–0.2)

## 2019-01-28 LAB — COMPREHENSIVE METABOLIC PANEL
ALT: 26 U/L (ref 0–44)
AST: 54 U/L — ABNORMAL HIGH (ref 15–41)
Albumin: 3.3 g/dL — ABNORMAL LOW (ref 3.5–5.0)
Alkaline Phosphatase: 83 U/L (ref 38–126)
Anion gap: 10 (ref 5–15)
BUN: 32 mg/dL — ABNORMAL HIGH (ref 8–23)
CO2: 23 mmol/L (ref 22–32)
Calcium: 9.2 mg/dL (ref 8.9–10.3)
Chloride: 106 mmol/L (ref 98–111)
Creatinine, Ser: 2.05 mg/dL — ABNORMAL HIGH (ref 0.44–1.00)
GFR calc Af Amer: 27 mL/min — ABNORMAL LOW (ref >60)
GFR calc non Af Amer: 23 mL/min — ABNORMAL LOW (ref >60)
Glucose, Bld: 97 mg/dL (ref 70–99)
Potassium: 3.9 mmol/L (ref 3.5–5.1)
Sodium: 139 mmol/L (ref 135–145)
Total Bilirubin: 1.1 mg/dL (ref 0.3–1.2)
Total Protein: 7.2 g/dL (ref 6.5–8.1)

## 2019-01-28 LAB — LIPASE, BLOOD: Lipase: 59 U/L — ABNORMAL HIGH (ref 11–51)

## 2019-01-28 LAB — SARS CORONAVIRUS 2 (TAT 6-24 HRS): SARS Coronavirus 2: NEGATIVE

## 2019-01-28 MED ORDER — PANTOPRAZOLE SODIUM 40 MG IV SOLR
40.0000 mg | Freq: Two times a day (BID) | INTRAVENOUS | Status: DC
  Administered 2019-01-28 – 2019-01-29 (×3): 40 mg via INTRAVENOUS
  Filled 2019-01-28 (×3): qty 40

## 2019-01-28 MED ORDER — SODIUM CHLORIDE 0.9 % IV SOLN
INTRAVENOUS | Status: DC
  Administered 2019-01-28: 06:00:00 via INTRAVENOUS

## 2019-01-28 MED ORDER — ALUM & MAG HYDROXIDE-SIMETH 200-200-20 MG/5ML PO SUSP
30.0000 mL | Freq: Four times a day (QID) | ORAL | Status: DC | PRN
  Filled 2019-01-28: qty 30

## 2019-01-28 MED ORDER — ALUM & MAG HYDROXIDE-SIMETH 200-200-20 MG/5ML PO SUSP
30.0000 mL | Freq: Once | ORAL | Status: AC
  Administered 2019-01-28: 30 mL via ORAL

## 2019-01-28 MED ORDER — MORPHINE SULFATE (PF) 2 MG/ML IV SOLN: 1.0000 mg | INTRAVENOUS | Status: DC | PRN

## 2019-01-28 MED ORDER — PROMETHAZINE HCL 25 MG/ML IJ SOLN: 12.5000 mg | Freq: Four times a day (QID) | INTRAMUSCULAR | Status: DC | PRN

## 2019-01-28 MED ORDER — HYDRALAZINE HCL 20 MG/ML IJ SOLN: 5.0000 mg | INTRAMUSCULAR | Status: DC | PRN

## 2019-01-28 MED ORDER — PANTOPRAZOLE SODIUM 40 MG IV SOLR
40.0000 mg | Freq: Once | INTRAVENOUS | Status: AC
  Administered 2019-01-28: 04:00:00 40 mg via INTRAVENOUS
  Filled 2019-01-28: qty 40

## 2019-01-28 MED ORDER — ONDANSETRON HCL 4 MG/2ML IJ SOLN: 4.0000 mg | Freq: Four times a day (QID) | INTRAMUSCULAR | Status: DC | PRN

## 2019-01-28 MED ORDER — LIDOCAINE VISCOUS HCL 2 % MT SOLN
15.0000 mL | Freq: Four times a day (QID) | OROMUCOSAL | Status: DC | PRN
  Filled 2019-01-28: qty 15

## 2019-01-28 MED ORDER — MORPHINE SULFATE (PF) 4 MG/ML IV SOLN
4.0000 mg | Freq: Once | INTRAVENOUS | Status: AC
  Administered 2019-01-28: 4 mg via INTRAVENOUS
  Filled 2019-01-28: qty 1

## 2019-01-28 MED ORDER — PROMETHAZINE HCL 25 MG/ML IJ SOLN
12.5000 mg | Freq: Once | INTRAMUSCULAR | Status: AC
  Administered 2019-01-28: 12.5 mg via INTRAVENOUS
  Filled 2019-01-28: qty 1

## 2019-01-28 MED ORDER — ATORVASTATIN CALCIUM 40 MG PO TABS
40.0000 mg | ORAL_TABLET | Freq: Every day | ORAL | Status: DC
  Administered 2019-01-28 – 2019-01-29 (×2): 40 mg via ORAL
  Filled 2019-01-28 (×2): qty 1

## 2019-01-28 MED ORDER — CARVEDILOL 3.125 MG PO TABS
3.1250 mg | ORAL_TABLET | Freq: Two times a day (BID) | ORAL | Status: DC
  Administered 2019-01-28 – 2019-01-30 (×4): 3.125 mg via ORAL
  Filled 2019-01-28 (×4): qty 1

## 2019-01-28 MED ORDER — APIXABAN 5 MG PO TABS
5.0000 mg | ORAL_TABLET | Freq: Two times a day (BID) | ORAL | Status: DC
  Administered 2019-01-28 – 2019-01-30 (×4): 5 mg via ORAL
  Filled 2019-01-28 (×5): qty 1

## 2019-01-28 MED ORDER — FAMOTIDINE IN NACL 20-0.9 MG/50ML-% IV SOLN: 20.0000 mg | Freq: Two times a day (BID) | INTRAVENOUS | Status: DC

## 2019-01-28 MED ORDER — ONDANSETRON HCL 4 MG/2ML IJ SOLN
4.0000 mg | Freq: Once | INTRAMUSCULAR | Status: AC
  Administered 2019-01-28: 4 mg via INTRAVENOUS
  Filled 2019-01-28: qty 2

## 2019-01-28 MED ORDER — ACETAMINOPHEN 325 MG PO TABS: 650.0000 mg | ORAL_TABLET | Freq: Four times a day (QID) | ORAL | Status: DC | PRN

## 2019-01-28 MED ORDER — ACETAMINOPHEN 650 MG RE SUPP: 650.0000 mg | Freq: Four times a day (QID) | RECTAL | Status: DC | PRN

## 2019-01-28 MED ORDER — MEXILETINE HCL 200 MG PO CAPS
200.0000 mg | ORAL_CAPSULE | Freq: Two times a day (BID) | ORAL | Status: DC
  Administered 2019-01-28 – 2019-01-30 (×5): 200 mg via ORAL
  Filled 2019-01-28 (×7): qty 1

## 2019-01-28 MED ORDER — FERROUS GLUCONATE 324 (38 FE) MG PO TABS
324.0000 mg | ORAL_TABLET | Freq: Two times a day (BID) | ORAL | Status: DC
  Administered 2019-01-28 – 2019-01-30 (×5): 324 mg via ORAL
  Filled 2019-01-28 (×6): qty 1

## 2019-01-28 NOTE — Progress Notes (Signed)
Pt compalining of SOB at this time. Sit pt up the bed, turned O2 up to 4L at this time. RR is 20, O2 at 96. Pt states improvement. Will monitor pt.

## 2019-01-28 NOTE — ED Notes (Addendum)
ED TO INPATIENT HANDOFF REPORT  ED Nurse Name and Phone #:  [phone]  S Name/Age/Gender Stephanie Yates 73 y.o. female Room/Bed: 031C/031C  Code Status   Code Status: Prior  Home/SNF/Other home Patient oriented to: self, place, time and situation Is this baseline? Yes   Triage Complete: Triage complete  Chief Complaint abd pain, recent endoscopy-  Triage Note Pt BIB [facility] c/o central abdominal pain starting yesterday afternoon. Rates pain 9 out of 10. Pt had endoscopy Wednesday d/t ulcer. Pt denies nausea at this time.   EMS  BP 150/73 HR 62 SpO2 97 RA T 98.1   Allergies Allergies  Allergen Reactions  . Strawberry Extract Hives, Itching, Swelling and Other (See Comments)    Hives and itching     Level of Care/Admitting Diagnosis ED Disposition    ED Disposition Condition [facility] Area: [place] [100100]  Level of Care: Med-Surg [16]  I expect the patient will be discharged within 24 hours: Yes  LOW acuity---Tx typically complete <24 hrs---ACUTE conditions typically can be evaluated <24 hours---LABS likely to return to acceptable levels <24 hours---IS near functional baseline---EXPECTED to return to current living arrangement---NOT newly hypoxic: Meets criteria for 5C-Observation unit  Covid Evaluation: Asymptomatic Screening Protocol (No Symptoms)  Diagnosis: Epigastric abdominal pain [379024]  Admitting Physician: [name] [[id number]]  Attending Physician: [name] [[id number]]  PT Class (Do Not Modify): Observation [104]  PT Acc Code (Do Not Modify): Observation [10022]       B Medical/Surgery History Past Medical History:  Diagnosis Date  . Chronic anemia   . Chronic systolic CHF (congestive heart failure) ([place])   . CKD (chronic kidney disease), stage IV ([place])   . Former tobacco use   . History of tobacco abuse   . Hyperlipidemia   . Hypertension   . Mitral regurgitation   . Nonischemic  cardiomyopathy ([place])    a. EF 10-15% on 11/2012 cath with no significant CAD. b. EF 25% in 2019. b. EF 15% in 12/2018  . PAF (paroxysmal atrial fibrillation) ([place])   . Protein calorie malnutrition ([place])   . Quit consuming alcohol in remote past   . Sinus bradycardia 12/21/2012  . Ventricular tachycardia ([place]) 12/14/2012   Past Surgical History:  Procedure Laterality Date  . BIOPSY  01/22/2019   Procedure: BIOPSY;  Surgeon: Wilford Corner, MD;  Location: [facility];  Service: Endoscopy;;  . ESOPHAGOGASTRODUODENOSCOPY (EGD) WITH PROPOFOL N/A 06/18/2017   Procedure: ESOPHAGOGASTRODUODENOSCOPY (EGD) WITH PROPOFOL;  Surgeon: Ronnette Juniper, MD;  Location: [facility];  Service: Gastroenterology;  Laterality: N/A;  . ESOPHAGOGASTRODUODENOSCOPY (EGD) WITH PROPOFOL N/A 01/22/2019   Procedure: ESOPHAGOGASTRODUODENOSCOPY (EGD) WITH PROPOFOL;  Surgeon: Wilford Corner, MD;  Location: [facility];  Service: Endoscopy;  Laterality: N/A;  . IMPLANTABLE CARDIOVERTER DEFIBRILLATOR IMPLANT  12/19/2012   St. Jude dual-chamber ICD, serial number [id number]  . IMPLANTABLE CARDIOVERTER DEFIBRILLATOR IMPLANT N/A 12/19/2012   Procedure: IMPLANTABLE CARDIOVERTER DEFIBRILLATOR IMPLANT;  Surgeon: Deboraha Sprang, MD;  Location: [facility] CATH LAB;  Service: Cardiovascular;  Laterality: N/A;  . LEFT AND RIGHT HEART CATHETERIZATION WITH CORONARY ANGIOGRAM N/A 12/17/2012   Procedure: LEFT AND RIGHT HEART CATHETERIZATION WITH CORONARY ANGIOGRAM;  Surgeon: Sherren Mocha, MD;  Location: [facility] CATH LAB;  Service: Cardiovascular;  Laterality: N/A;  . SCLEROTHERAPY  01/22/2019   Procedure: SCLEROTHERAPY;  Surgeon: Wilford Corner, MD;  Location: [facility] ENDOSCOPY;  Service: Endoscopy;;     A IV Location/Drains/Wounds Patient Lines/Drains/Airways Status   Active Line/Drains/Airways  Name:   Placement date:   Placement time:   Site:   Days:   Peripheral IV 01/28/19 Anterior;Left;Proximal Forearm   01/28/19    0040    Forearm   less than 1           Intake/Output Last 24 hours  Intake/Output Summary (Last 24 hours) at 01/28/2019 0428 Last data filed at 01/28/2019 0242 Gross per 24 hour  Intake 1000 ml  Output -  Net 1000 ml    Labs/Imaging Results for orders placed or performed during the hospital encounter of 01/27/19 (from the past 48 hour(s))  Lipase, blood     Status: Abnormal   Collection Time: 01/28/19 12:07 AM  Result Value Ref Range   Lipase 59 (H) 11 - 51 U/L    Comment: Performed at [facility], [address] [place], [place] [address]  Comprehensive metabolic panel     Status: Abnormal   Collection Time: 01/28/19 12:07 AM  Result Value Ref Range   Sodium 139 135 - 145 mmol/L   Potassium 3.9 3.5 - 5.1 mmol/L   Chloride 106 98 - 111 mmol/L   CO2 23 22 - 32 mmol/L   Glucose, Bld 97 70 - 99 mg/dL   BUN 32 (H) 8 - 23 mg/dL   Creatinine, Ser 2.05 (H) 0.44 - 1.00 mg/dL   Calcium 9.2 8.9 - 10.3 mg/dL   Total Protein 7.2 6.5 - 8.1 g/dL   Albumin 3.3 (L) 3.5 - 5.0 g/dL   AST 54 (H) 15 - 41 U/L   ALT 26 0 - 44 U/L   Alkaline Phosphatase 83 38 - 126 U/L   Total Bilirubin 1.1 0.3 - 1.2 mg/dL   GFR calc non Af Amer 23 (L) >60 mL/min   GFR calc Af Amer 27 (L) >60 mL/min   Anion gap 10 5 - 15    Comment: Performed at [facility] [address] [place], [place] [address]  CBC     Status: Abnormal   Collection Time: 01/28/19 12:07 AM  Result Value Ref Range   WBC 5.5 4.0 - 10.5 K/uL   RBC 4.86 3.87 - 5.11 MIL/uL   Hemoglobin 11.9 (L) 12.0 - 15.0 g/dL   HCT 37.7 36.0 - 46.0 %   MCV 77.6 (L) 80.0 - 100.0 fL   MCH 24.5 (L) 26.0 - 34.0 pg   MCHC 31.6 30.0 - 36.0 g/dL   RDW 23.2 (H) 11.5 - 15.5 %   Platelets 189 150 - 400 K/uL   nRBC 0.0 0.0 - 0.2 %    Comment: Performed at [facility], [place] [address] [place], [place] [address]   Ct Abdomen Pelvis Wo Contrast  Result Date: 01/28/2019 CLINICAL DATA:  Central abdominal pain.  Recent endoscopy. EXAM: CT ABDOMEN AND PELVIS WITHOUT  CONTRAST TECHNIQUE: Multidetector CT imaging of the abdomen and pelvis was performed following the standard protocol without IV contrast. COMPARISON:  CT abdomen pelvis 01/23/2019 FINDINGS: LOWER CHEST: There is no basilar pleural or apical pericardial effusion. Mild cardiomegaly. HEPATOBILIARY: The hepatic contours and density are normal. There is no intra- or extrahepatic biliary dilatation. The gallbladder is normal. PANCREAS: The pancreatic parenchymal contours are normal and there is no ductal dilatation. There is no peripancreatic fluid collection. SPLEEN: Normal. ADRENALS/URINARY TRACT: --Adrenal glands: There is a right adrenal lesion measuring 36 x 23 mm with an attenuation of -15 HU. --Right kidney/ureter: Single nonobstructing renal calculus measuring 4 mm. No hydronephrosis, perinephric  stranding or solid renal mass. --Left kidney/ureter: Single nonobstructing renal calculus measuring 3 mm. No hydronephrosis, perinephric stranding or solid renal mass. --Urinary bladder: Normal for degree of distention STOMACH/BOWEL: --Stomach/Duodenum: There is no hiatal hernia or other gastric abnormality. The duodenal course and caliber are normal. --Small bowel: No dilatation or inflammation. --Colon: No focal abnormality. --Appendix: Not visualized. No right lower quadrant inflammation or free fluid. VASCULAR/LYMPHATIC: There is aortic atherosclerosis without hemodynamically significant stenosis. No abdominal or pelvic lymphadenopathy. REPRODUCTIVE: 18 mm calcified uterine fibroid. MUSCULOSKELETAL. Grade 1 anterolisthesis at L4-5. OTHER: None. IMPRESSION: 1. No acute abdominal or pelvic abnormality. 2. Mild cardiomegaly. 3. Bilateral nonobstructive nephrolithiasis. 4. Right adrenal myelolipoma. Electronically Signed   By: Ulyses Jarred M.D.   On: 01/28/2019 02:47    Pending Labs Unresulted Labs (From admission, onward)    Start     Ordered   01/28/19 0325  SARS CORONAVIRUS 2 Nasal Swab Aptima Multi Swab   (Asymptomatic Patients Labs)  Once,   STAT    Question Answer Comment  Is this test for diagnosis or screening Screening   Symptomatic for COVID-19 as defined by CDC No   Hospitalized for COVID-19 No   Admitted to ICU for COVID-19 No   Previously tested for COVID-19 Yes   Resident in a congregate (group) care setting No   Employed in healthcare setting No   Pregnant No      01/28/19 0325   01/27/19 2329  Urinalysis, Routine w reflex microscopic  ONCE - STAT,   STAT     01/27/19 2329          Vitals/Pain Today's Vitals   01/28/19 0200 01/28/19 0243 01/28/19 0315 01/28/19 0400  BP: (!) 135/59  130/68 (!) 141/82  Pulse: 60  60 61  Resp: 17  14 15   Temp:      TempSrc:      SpO2: 91%  94% 94%  Weight:      Height:      PainSc:  8       Isolation Precautions No active isolations  Medications Medications  sodium chloride flush (NS) 0.9 % injection 3 mL (3 mLs Intravenous Given 01/28/19 0203)  sodium chloride 0.9 % bolus 1,000 mL (0 mLs Intravenous Stopped 01/28/19 0242)  alum & mag hydroxide-simeth (MAALOX/MYLANTA) 200-200-20 MG/5ML suspension 30 mL (30 mLs Oral Given 01/28/19 0013)    And  lidocaine (XYLOCAINE) 2 % viscous mouth solution 15 mL (15 mLs Oral Given 01/28/19 0013)  morphine 4 MG/ML injection 4 mg (4 mg Intravenous Given 01/28/19 0202)  ondansetron (ZOFRAN) injection 4 mg (4 mg Intravenous Given 01/28/19 0202)  promethazine (PHENERGAN) injection 12.5 mg (12.5 mg Intravenous Given 01/28/19 0302)  pantoprazole (PROTONIX) injection 40 mg (40 mg Intravenous Given 01/28/19 8299)    Mobility walks with person assist Low fall risk   Focused Assessments GI   R Recommendations: See Admitting Provider Note  Report given to:   Additional Notes:

## 2019-01-28 NOTE — H&P (Signed)
History and Physical    Stephanie Yates JAS:[id number] DOB: 11/13/1945 DOA: 01/27/2019  PCP: Josetta Huddle, MD Patient coming from: Home  Chief Complaint: Abdominal pain  HPI: Stephanie Yates is a 73 y.o. female with medical history significant of chronic anemia, NICM with EF 15% s/p ICD, CKD stage IV, hypertension, hyperlipidemia, paroxysmal atrial fibrillation presenting to the hospital via EMS for evaluation of abdominal pain.  Patient underwent endoscopy on 7/28 which revealed inflammation within the mucosa of the gastric antrum.  Patient states she has had intermittent epigastric abdominal pain for years but it started again yesterday.  She has also been feeling nauseous but has not vomited.  She describes the pain as dull in character.  Denies any burning sensation.  States she was able to eat chicken, green beans, and cream potatoes for dinner.  Symptoms have improved slightly after receiving medications in the ED.  Denies any fevers or chills.  States her stool was "pitch black" in color yesterday morning.  Denies any over-the-counter NSAID use.  Denies alcohol and tobacco use.  ED Course: Afebrile and hemodynamically stable.  No leukocytosis.  Hemoglobin 11.9, no significant change from baseline.  Lipase borderline elevated at 59.  AST mildly elevated at 54, remainder of LFTs normal.  COVID-19 rapid test pending.  CT abdomen pelvis showing no acute abnormality, no evidence of perforation.  Patient received morphine, Zofran, Protonix, Phenergan, and GI cocktail but continued to have nausea and vomiting.  Also received 1 L fluid bolus.  Admission requested for observation.  Review of Systems:  All systems reviewed and apart from history of presenting illness, are negative.  Past Medical History:  Diagnosis Date   Chronic anemia    Chronic systolic CHF (congestive heart failure) (HCC)    CKD (chronic kidney disease), stage IV (HCC)    Former tobacco use    History of tobacco abuse     Hyperlipidemia    Hypertension    Mitral regurgitation    Nonischemic cardiomyopathy ([place])    a. EF 10-15% on 11/2012 cath with no significant CAD. b. EF 25% in 2019. b. EF 15% in 12/2018   PAF (paroxysmal atrial fibrillation) (HCC)    Protein calorie malnutrition ([place])    Quit consuming alcohol in remote past    Sinus bradycardia 12/21/2012   Ventricular tachycardia ([place]) 12/14/2012    Past Surgical History:  Procedure Laterality Date   BIOPSY  01/22/2019   Procedure: BIOPSY;  Surgeon: Wilford Corner, MD;  Location: [place];  Service: Endoscopy;;   ESOPHAGOGASTRODUODENOSCOPY (EGD) WITH PROPOFOL N/A 06/18/2017   Procedure: ESOPHAGOGASTRODUODENOSCOPY (EGD) WITH PROPOFOL;  Surgeon: Ronnette Juniper, MD;  Location: [place];  Service: Gastroenterology;  Laterality: N/A;   ESOPHAGOGASTRODUODENOSCOPY (EGD) WITH PROPOFOL N/A 01/22/2019   Procedure: ESOPHAGOGASTRODUODENOSCOPY (EGD) WITH PROPOFOL;  Surgeon: Wilford Corner, MD;  Location: [place];  Service: Endoscopy;  Laterality: N/A;   IMPLANTABLE CARDIOVERTER DEFIBRILLATOR IMPLANT  12/19/2012   [employer] Jude dual-chamber ICD, serial number [id number]   IMPLANTABLE CARDIOVERTER DEFIBRILLATOR IMPLANT N/A 12/19/2012   Procedure: IMPLANTABLE CARDIOVERTER DEFIBRILLATOR IMPLANT;  Surgeon: Deboraha Sprang, MD;  [facility] C[facility] LAB;  Service: Cardiovascular;  Laterality: N/A;   LEFT AND RIGHT HEART CATHETERIZATION WITH CORONARY ANGIOGRAM N/A 12/17/2012   Procedure: LEFT AND RIGHT HEART CATHETERIZATION WITH CORONARY ANGIOGRAM;  Surgeon: Sherren Mocha, MD;  Location: [facility] CATH LAB;  Service: Cardiovascular;  Laterality: N/A;   SCLEROTHERAPY  01/22/2019   Procedure: SCLEROTHERAPY;  Surgeon: Wilford Corner, MD;  Location: [place];  Service: Endoscopy;;     reports that she has quit smoking. She has never used smokeless tobacco. She reports that she does not drink alcohol or use drugs.  Allergies  Allergen Reactions   Strawberry  Extract Hives, Itching, Swelling and Other (See Comments)    Hives and itching     Family History  Problem Relation Age of Onset   Cancer Mother     Prior to Admission medications   Medication Sig Start Date End Date Taking? Authorizing Provider  acetaminophen (TYLENOL) 500 MG tablet Take 1,000 mg by mouth every 6 (six) hours as needed for mild pain.    [provider]  amiodarone (PACERONE) 200 MG tablet Take 1 tablet (200 mg total) by mouth daily. 02/15/19   [name], PA-C  amiodarone (PACERONE) 400 MG tablet Take 1 tablet (400 mg total) by mouth daily for 21 days. START by taking 1 tablets (400mg ) twice daily for 1 week, then reduce to 1 tablets (400mg ) once daily for 2 weeks 01/24/19 02/14/19  [name], PA-C  apixaban (ELIQUIS) 5 MG TABS tablet Take 1 tablet (5 mg total) by mouth 2 (two) times daily. 01/12/19 02/11/19  Guilford Shi, MD  atorvastatin (LIPITOR) 40 MG tablet Take 1 tablet (40 mg total) by mouth daily at 6 PM. 07/05/18   Deboraha Sprang, MD  carvedilol (COREG) 3.125 MG tablet Take 1 tablet (3.125 mg total) by mouth 2 (two) times daily with a meal. 09/12/18   Deboraha Sprang, MD  Cholecalciferol (VITAMIN D-3 PO) Take 1,000 Units by mouth at bedtime.     [provider]  ferrous gluconate (FERGON) 324 MG tablet Take 324 mg by mouth 2 (two) times daily.  07/05/18   [provider]  furosemide (LASIX) 80 MG tablet Take 1 tablet (80 mg total) by mouth daily. 07/05/18   Deboraha Sprang, MD  hydrALAZINE (APRESOLINE) 25 MG tablet Take 0.5 tablets (12.5 mg total) by mouth 3 (three) times daily. 01/12/19   Guilford Shi, MD  isosorbide mononitrate (IMDUR) 30 MG 24 hr tablet Take 0.5 tablets (15 mg total) by mouth daily. 01/13/19   Guilford Shi, MD  levalbuterol (XOPENEX HFA) 45 MCG/ACT inhaler Inhale 1 puff into the lungs every 4 (four) hours as needed for wheezing. 01/12/19 01/12/20  Guilford Shi, MD  magnesium oxide (MAG-OX) 400 (241.3  Mg) MG tablet Take 1 tablet (400 mg total) by mouth daily. 07/17/18   Deboraha Sprang, MD  metolazone (ZAROXOLYN) 2.5 MG tablet Take 1 tablet (2.5 mg total) by mouth daily as needed (leg swellings or shortness of breath or weight gain>5 pounds in 48 hours). 01/12/19   Guilford Shi, MD  mexiletine (MEXITIL) 200 MG capsule Take 1 capsule (200 mg total) by mouth 2 (two) times daily. 01/24/19   [name], PA-C  nitroGLYCERIN (NITROSTAT) 0.4 MG SL tablet Place 1 tablet (0.4 mg total) under the tongue every 5 (five) minutes as needed for chest pain. 01/12/19   Guilford Shi, MD  pantoprazole (PROTONIX) 40 MG tablet Take 1 tablet (40 mg total) by mouth 2 (two) times daily before a meal. 06/18/17   Lavina Hamman, MD    Physical Exam: Vitals:   01/28/19 0145 01/28/19 0200 01/28/19 0315 01/28/19 0400  BP: 136/64 (!) 135/59 130/68 (!) 141/82  Pulse: (!) 58 60 60 61  Resp: 16 17 14 15   Temp:      TempSrc:      SpO2: 91% 91% 94% 94%  Weight:      Height:        Physical Exam  Constitutional: She is oriented to person, place, and time. She appears well-developed and well-nourished.  HENT:  Head: Normocephalic.  Mouth/Throat: Oropharynx is clear and moist.  Eyes: Right eye exhibits no discharge. Left eye exhibits no discharge.  Neck: Neck supple.  Cardiovascular: Normal rate, regular rhythm and intact distal pulses.  Pulmonary/Chest: Effort normal and breath sounds normal. No respiratory distress. She has no wheezes. She has no rales.  Abdominal: Soft. Bowel sounds are normal. She exhibits no distension. There is abdominal tenderness. There is guarding. There is no rebound.  Epigastrium tender to palpation with guarding No rebound or rigidity  Musculoskeletal:        General: No edema.  Neurological: She is alert and oriented to person, place, and time.  Skin: Skin is warm and dry. She is not diaphoretic.     Labs on Admission: I have personally reviewed following labs and  imaging studies  CBC: Recent Labs  Lab 01/23/19 1433 01/28/19 0007  WBC 6.3 5.5  HGB 12.6 11.9*  HCT 38.7 37.7  MCV 74.6* 77.6*  PLT 218 478   Basic Metabolic Panel: Recent Labs  Lab 01/23/19 1433 01/28/19 0007  NA 138 139  K 3.6 3.9  CL 103 106  CO2 25 23  GLUCOSE 95 97  BUN 27* 32*  CREATININE 1.91* 2.05*  CALCIUM 9.0 9.2   GFR: Estimated Creatinine Clearance: 21.3 mL/min (A) (by C-G formula based on SCr of 2.05 mg/dL (H)). Liver Function Tests: Recent Labs  Lab 01/28/19 0007  AST 54*  ALT 26  ALKPHOS 83  BILITOT 1.1  PROT 7.2  ALBUMIN 3.3*   Recent Labs  Lab 01/28/19 0007  LIPASE 59*   No results for input(s): AMMONIA in the last 168 hours. Coagulation Profile: No results for input(s): INR, PROTIME in the last 168 hours. Cardiac Enzymes: No results for input(s): CKTOTAL, CKMB, CKMBINDEX, TROPONINI in the last 168 hours. BNP (last 3 results) No results for input(s): PROBNP in the last 8760 hours. HbA1C: No results for input(s): HGBA1C in the last 72 hours. CBG: No results for input(s): GLUCAP in the last 168 hours. Lipid Profile: No results for input(s): CHOL, HDL, LDLCALC, TRIG, CHOLHDL, LDLDIRECT in the last 72 hours. Thyroid Function Tests: No results for input(s): TSH, T4TOTAL, FREET4, T3FREE, THYROIDAB in the last 72 hours. Anemia Panel: No results for input(s): VITAMINB12, FOLATE, FERRITIN, TIBC, IRON, RETICCTPCT in the last 72 hours. Urine analysis:    Component Value Date/Time   COLORURINE YELLOW 01/17/2019 [place] 01/17/2019 0937   LABSPEC 1.010 01/17/2019 0937   PHURINE 6.0 01/17/2019 0937   GLUCOSEU NEGATIVE 01/17/2019 0937   HGBUR LARGE (A) 01/17/2019 0937   BILIRUBINUR NEGATIVE 01/17/2019 0937   KETONESUR NEGATIVE 01/17/2019 0937   PROTEINUR NEGATIVE 01/17/2019 0937   UROBILINOGEN 1.0 08/29/2014 2035   NITRITE NEGATIVE 01/17/2019 0937   LEUKOCYTESUR NEGATIVE 01/17/2019 2956    Radiological Exams on  Admission: Ct Abdomen Pelvis Wo Contrast  Result Date: 01/28/2019 CLINICAL DATA:  Central abdominal pain.  Recent endoscopy. EXAM: CT ABDOMEN AND PELVIS WITHOUT CONTRAST TECHNIQUE: Multidetector CT imaging of the abdomen and pelvis was performed following the standard protocol without IV contrast. COMPARISON:  CT abdomen pelvis 01/23/2019 FINDINGS: LOWER CHEST: There is no basilar pleural or apical pericardial effusion. Mild cardiomegaly. HEPATOBILIARY: The hepatic contours and density are normal. There is no intra- or extrahepatic biliary dilatation.  The gallbladder is normal. PANCREAS: The pancreatic parenchymal contours are normal and there is no ductal dilatation. There is no peripancreatic fluid collection. SPLEEN: Normal. ADRENALS/URINARY TRACT: --Adrenal glands: There is a right adrenal lesion measuring 36 x 23 mm with an attenuation of -15 HU. --Right kidney/ureter: Single nonobstructing renal calculus measuring 4 mm. No hydronephrosis, perinephric stranding or solid renal mass. --Left kidney/ureter: Single nonobstructing renal calculus measuring 3 mm. No hydronephrosis, perinephric stranding or solid renal mass. --Urinary bladder: Normal for degree of distention STOMACH/BOWEL: --Stomach/Duodenum: There is no hiatal hernia or other gastric abnormality. The duodenal course and caliber are normal. --Small bowel: No dilatation or inflammation. --Colon: No focal abnormality. --Appendix: Not visualized. No right lower quadrant inflammation or free fluid. VASCULAR/LYMPHATIC: There is aortic atherosclerosis without hemodynamically significant stenosis. No abdominal or pelvic lymphadenopathy. REPRODUCTIVE: 18 mm calcified uterine fibroid. MUSCULOSKELETAL. Grade 1 anterolisthesis at L4-5. OTHER: None. IMPRESSION: 1. No acute abdominal or pelvic abnormality. 2. Mild cardiomegaly. 3. Bilateral nonobstructive nephrolithiasis. 4. Right adrenal myelolipoma. Electronically Signed   By: Ulyses Jarred M.D.   On: 01/28/2019  02:47    Assessment/Plan Principal Problem:   Gastritis Active Problems:   Hypertension   PAF (paroxysmal atrial fibrillation) (HCC)   CKD (chronic kidney disease) stage 4, GFR 15-29 ml/min (HCC)  Gastritis Patient is presenting with complains of epigastric abdominal pain and nausea.  Underwent endoscopy on 7/28 which revealed inflammation within the mucosa of the gastric antrum.  Afebrile and no leukocytosis.  Patient reports having an episode of melena yesterday.  Hemoglobin 11.9, no significant change from baseline.  CT abdomen pelvis showing no acute abnormality, no evidence of perforation. -Gentle IV fluid hydration -IV Protonix 40 mg twice daily -GI cocktail PRN -IV morphine PRN -IV Zofran PRN, IV Phenergan PRN -Check FOBT  CKD stage IV -Creatinine 2.0, at baseline.  Hypertension Systolic currently in 161W to 140s. -IV hydralazine PRN SBP greater than 160  Paroxysmal atrial fibrillation -Checking FOBT due to concern for possible melena.  Hold home Eliquis at this time.  Unable to safely order home medications at this time as pharmacy medication reconciliation is pending.  DVT prophylaxis: SCDs Code Status: Patient wishes to be full code. Family Communication: No family available at this time. Disposition Plan: Anticipate discharge after clinical improvement. Consults called: None Admission status: It is my clinical opinion that referral for OBSERVATION is reasonable and necessary in this patient based on the above information provided. The aforementioned taken together are felt to place the patient at high risk for further clinical deterioration. However it is anticipated that the patient may be medically stable for discharge from the hospital within 24 to 48 hours.  The medical decision making on this patient was of high complexity and the patient is at high risk for clinical deterioration, therefore this is a level 3 visit.  [name] MD [facility] Pager [phone]  If 7PM-7AM, please contact night-coverage [email or web address] Password [facility]  01/28/2019, 4:37 AM

## 2019-01-28 NOTE — ED Notes (Signed)
Unable to remove O2 pt spo2 dropped 86% on room air

## 2019-01-28 NOTE — ED Notes (Signed)
Patient transported to CT 

## 2019-01-28 NOTE — ED Notes (Signed)
Pt spo2 droped to 86% on RA, Pt placed on 2 L nasal canula spo2 increased to 94%

## 2019-01-28 NOTE — Progress Notes (Signed)
PROGRESS NOTE  Stephanie Yates VZD:[id number] DOB: 1945/11/15 DOA: 01/27/2019 PCP: Josetta Huddle, MD   LOS: 0 days   Brief narrative: Patient is a 73 y.o. female with PMH significant for HTN, HLD, NICM with EF 15% s/p ICD, pAF, CKD stage IV, chronic anemia.   She presented to the ED on 8/2 with complaint of worsening abdominal pain, intermittent, epigastric, associated with nausea and no urinary symptoms. Patient apparently underwent endoscopy on 7/28 which revealed inflammation within the mucosa of the gastric antrum.   In the ED, patient was afebrile, hemodynamically stable. Work-up showed normal WBC count, hemoglobin 11.9, no significant change from baseline. Lipase level normal at 59, LFTs normal. CT abdomen pelvis showed no acute abnormality, no evidence of perforation.   Patient received morphine, Zofran, Protonix, Phenergan, and GI cocktail but continued to have nausea and vomiting.  Patient was kept in observation for evaluation of abdominal pain.    Subjective: Patient was seen and examined this morning.  Pleasant elderly African-American female.  Seems to have baseline resting tremors, ?  History of parkinsonism. She lives by herself and ambulates without assistance. Continues to have abdominal pain, positive for tenderness on epigastric area.  Nauseous but has not vomited this morning.  Assessment/Plan:  Principal Problem:   Gastritis Active Problems:   Hypertension   PAF (paroxysmal atrial fibrillation) (HCC)   CKD (chronic kidney disease) stage 4, GFR 15-29 ml/min (HCC)  Intractable abdominal pain/ acute worsening of chronic abdominal pain -Patient has history of chronic abdominal pain.  She had endoscopy on 7/28 which showed mild gastritis only.  Lipase level normal.  CT scan of abdomen negative for any intra-abdominal pathology.   -Unclear cause of intractable abdominal pain and nausea.   -Was maintained n.p.o. on admission.  Will start on full liquid diet.   -She is  on IV hydration.  We will stop it because of her very poor EF of 15%. -IV Protonix 40 mg twice daily -GI cocktail PRN -IV morphine PRN -IV Zofran PRN, IV Phenergan PRN  Cardiovascular issues: NICM EF 15%, HTN, HLD, pAF, CKD4 -Prior to admission, patient was on Coreg, Eliquis, Lasix 80 mg daily, hydralazine 12.5 mg 3 times daily, Imdur 15 mg daily, metolazone 2.5 mg daily, mexiletine 200 mg daily, statin.  Apparently she was also supposed to be on but is not taking amiodarone. -Currently rate controlled, blood pressure stable.  Not on CHF exacerbation.  Creatinine at baseline. -All meds were kept on hold on admission. -I will resume amiodarone, Coreg, Eliquis, mexiletine and statin at this time.  Keep Lasix, metolazone, hydralazine and Imdur on hold.  -We will resume based on blood pressure and volume status.  Chronic anemia -Likely anemia of chronic kidney disease.  Continue iron supplement.  Body mass index is 23.8 kg/m. Mobility: Encourage ambulation Diet: Full liquid diet DVT prophylaxis:  Eliquis Code Status:   Code Status: Full Code  Family Communication:  Expected Discharge:  Pending clinical course  Consultants:  None  Procedures:  None  Antimicrobials:  Anti-infectives (From admission, onward)   None      Infusions:  . sodium chloride 75 mL/hr at 01/28/19 0619    Scheduled Meds: . pantoprazole (PROTONIX) IV  40 mg Intravenous Q12H    PRN meds: acetaminophen **OR** acetaminophen, alum & mag hydroxide-simeth **AND** lidocaine, hydrALAZINE, morphine injection, ondansetron (ZOFRAN) IV, promethazine   Objective: Vitals:   01/28/19 0430 01/28/19 0526  BP: (!) 141/91 135/61  Pulse: 64 (!) 59  Resp: 20 17  Temp:  98.5 F (36.9 C)  SpO2: 94% 95%    Intake/Output Summary (Last 24 hours) at 01/28/2019 1247 Last data filed at 01/28/2019 7618 Gross per 24 hour  Intake 1000 ml  Output -  Net 1000 ml   Filed Weights   01/27/19 2326  Weight: 64.9 kg    Weight change:  Body mass index is 23.8 kg/m.   Physical Exam: General exam: Appears calm and comfortable.  Skin: No rashes, lesions or ulcers. HEENT: Atraumatic, normocephalic, supple neck, no obvious bleeding Lungs: Clear to auscultation bilaterally CVS: Regular rate and rhythm, no murmur GI/Abd soft, mild tenderness in epigastrium, nondistended, bowel sound present CNS: Alert, awake, oriented x3.  Slow to respond. Psychiatry: Mood appropriate Extremities: No pedal edema, no calf tenderness  Data Review: I have personally reviewed the laboratory data and studies available.  Recent Labs  Lab 01/23/19 1433 01/28/19 0007  WBC 6.3 5.5  HGB 12.6 11.9*  HCT 38.7 37.7  MCV 74.6* 77.6*  PLT 218 189   Recent Labs  Lab 01/23/19 1433 01/28/19 0007  NA 138 139  K 3.6 3.9  CL 103 106  CO2 25 23  GLUCOSE 95 97  BUN 27* 32*  CREATININE 1.91* 2.05*  CALCIUM 9.0 9.2    [name], MD  [facility] 01/28/2019

## 2019-01-29 DIAGNOSIS — I480 Paroxysmal atrial fibrillation: Secondary | ICD-10-CM | POA: Diagnosis present

## 2019-01-29 DIAGNOSIS — K2970 Gastritis, unspecified, without bleeding: Secondary | ICD-10-CM | POA: Diagnosis present

## 2019-01-29 DIAGNOSIS — Z87891 Personal history of nicotine dependence: Secondary | ICD-10-CM | POA: Diagnosis not present

## 2019-01-29 DIAGNOSIS — I340 Nonrheumatic mitral (valve) insufficiency: Secondary | ICD-10-CM | POA: Diagnosis present

## 2019-01-29 DIAGNOSIS — G8929 Other chronic pain: Secondary | ICD-10-CM | POA: Diagnosis present

## 2019-01-29 DIAGNOSIS — I428 Other cardiomyopathies: Secondary | ICD-10-CM | POA: Diagnosis present

## 2019-01-29 DIAGNOSIS — Z79899 Other long term (current) drug therapy: Secondary | ICD-10-CM | POA: Diagnosis not present

## 2019-01-29 DIAGNOSIS — I5022 Chronic systolic (congestive) heart failure: Secondary | ICD-10-CM | POA: Diagnosis present

## 2019-01-29 DIAGNOSIS — Z515 Encounter for palliative care: Secondary | ICD-10-CM | POA: Diagnosis present

## 2019-01-29 DIAGNOSIS — I10 Essential (primary) hypertension: Secondary | ICD-10-CM | POA: Diagnosis not present

## 2019-01-29 DIAGNOSIS — N184 Chronic kidney disease, stage 4 (severe): Secondary | ICD-10-CM | POA: Diagnosis present

## 2019-01-29 DIAGNOSIS — D631 Anemia in chronic kidney disease: Secondary | ICD-10-CM | POA: Diagnosis present

## 2019-01-29 DIAGNOSIS — Z20828 Contact with and (suspected) exposure to other viral communicable diseases: Secondary | ICD-10-CM | POA: Diagnosis present

## 2019-01-29 DIAGNOSIS — Z809 Family history of malignant neoplasm, unspecified: Secondary | ICD-10-CM | POA: Diagnosis not present

## 2019-01-29 DIAGNOSIS — E785 Hyperlipidemia, unspecified: Secondary | ICD-10-CM | POA: Diagnosis present

## 2019-01-29 DIAGNOSIS — I130 Hypertensive heart and chronic kidney disease with heart failure and stage 1 through stage 4 chronic kidney disease, or unspecified chronic kidney disease: Secondary | ICD-10-CM | POA: Diagnosis present

## 2019-01-29 DIAGNOSIS — K8020 Calculus of gallbladder without cholecystitis without obstruction: Secondary | ICD-10-CM | POA: Diagnosis present

## 2019-01-29 DIAGNOSIS — R0902 Hypoxemia: Secondary | ICD-10-CM | POA: Diagnosis present

## 2019-01-29 DIAGNOSIS — Z7901 Long term (current) use of anticoagulants: Secondary | ICD-10-CM | POA: Diagnosis not present

## 2019-01-29 DIAGNOSIS — I5084 End stage heart failure: Secondary | ICD-10-CM | POA: Diagnosis present

## 2019-01-29 DIAGNOSIS — R1013 Epigastric pain: Secondary | ICD-10-CM | POA: Diagnosis present

## 2019-01-29 LAB — CBC WITH DIFFERENTIAL/PLATELET
Abs Immature Granulocytes: 0 10*3/uL (ref 0.00–0.07)
Basophils Absolute: 0.3 10*3/uL — ABNORMAL HIGH (ref 0.0–0.1)
Basophils Relative: 5 %
Eosinophils Absolute: 0.2 10*3/uL (ref 0.0–0.5)
Eosinophils Relative: 4 %
HCT: 36.1 % (ref 36.0–46.0)
Hemoglobin: 11.2 g/dL — ABNORMAL LOW (ref 12.0–15.0)
Lymphocytes Relative: 31 %
Lymphs Abs: 1.6 10*3/uL (ref 0.7–4.0)
MCH: 24.6 pg — ABNORMAL LOW (ref 26.0–34.0)
MCHC: 31 g/dL (ref 30.0–36.0)
MCV: 79.3 fL — ABNORMAL LOW (ref 80.0–100.0)
Monocytes Absolute: 0.6 10*3/uL (ref 0.1–1.0)
Monocytes Relative: 11 %
Neutro Abs: 2.5 10*3/uL (ref 1.7–7.7)
Neutrophils Relative %: 49 %
Platelets: 169 10*3/uL (ref 150–400)
RBC: 4.55 MIL/uL (ref 3.87–5.11)
RDW: 23.4 % — ABNORMAL HIGH (ref 11.5–15.5)
WBC: 5 10*3/uL (ref 4.0–10.5)
nRBC: 0 % (ref 0.0–0.2)
nRBC: 0 /100 WBC

## 2019-01-29 LAB — MAGNESIUM: Magnesium: 2 mg/dL (ref 1.7–2.4)

## 2019-01-29 MED ORDER — PANTOPRAZOLE SODIUM 40 MG PO TBEC
40.0000 mg | DELAYED_RELEASE_TABLET | Freq: Two times a day (BID) | ORAL | Status: DC
  Administered 2019-01-29 – 2019-01-30 (×2): 40 mg via ORAL
  Filled 2019-01-29 (×2): qty 1

## 2019-01-29 MED ORDER — FUROSEMIDE 80 MG PO TABS
80.0000 mg | ORAL_TABLET | Freq: Every day | ORAL | Status: DC
  Administered 2019-01-29 – 2019-01-30 (×2): 80 mg via ORAL
  Filled 2019-01-29 (×2): qty 1

## 2019-01-29 MED ORDER — AMIODARONE HCL 200 MG PO TABS
200.0000 mg | ORAL_TABLET | Freq: Every day | ORAL | Status: DC
  Administered 2019-01-29 – 2019-01-30 (×2): 200 mg via ORAL
  Filled 2019-01-29 (×2): qty 1

## 2019-01-29 NOTE — Evaluation (Signed)
Physical Therapy Evaluation Patient Details Name: Stephanie Yates MRN: [id number] DOB: 06/29/1945 Today's Date: 01/29/2019   History of Present Illness  Pt is a 73 y.o. female admited 01/27/19 with abdominal pain. Abdominal CT showed no acute abnormalitiy. Pt worked up for gastritis. PMH includes NICM s/p ICD, CKD IV, HTN, afib.    Clinical Impression  Pt presents with an overall decrease in functional mobility secondary to above. PTA, pt independent and lives alone, was working at [employer]. Today, pt requiring HHA and assist to maintain balance with ambulation; despite education, pt adamantly declining use of RW for added stability. Pt would benefit from continued acute PT services to maximize functional mobility and independence prior to d/c with HHPT services.   SpO2 down to 84% on RA while ambulating, returning to 92% on 2L O2 [name]    Follow Up Recommendations Home health PT;Supervision - Intermittent    Equipment Recommendations  None recommended by PT    Recommendations for Other Services       Precautions / Restrictions Precautions Precautions: Fall Restrictions Weight Bearing Restrictions: No      Mobility  Bed Mobility Overal bed mobility: Independent                Transfers Overall transfer level: Needs assistance Equipment used: None Transfers: Sit to/from Stand Sit to Stand: Supervision            Ambulation/Gait Ambulation/Gait assistance: Min guard;Min assist Gait Distance (Feet): 60 Feet Assistive device: None;1 person hand held assist Gait Pattern/deviations: Step-through pattern;Decreased stride length;Staggering right;Staggering left Gait velocity: Decreased   General Gait Details: Slow, unsteady steps with min guard, pt reaching out to furniture for support, HHA provided; R knee buckling, 2x LOB requiring assist to correct. Pt adamantly declining use of RW, states, "I won't be walking like this at home."  Stairs             Wheelchair Mobility    Modified Rankin (Stroke Patients Only)       Balance Overall balance assessment: Needs assistance Sitting-balance support: No upper extremity supported;Feet supported Sitting balance-Leahy Scale: Good     Standing balance support: No upper extremity supported;During functional activity Standing balance-Leahy Scale: Fair Standing balance comment: Stability improved with UE support                             Pertinent Vitals/Pain Pain Assessment: Faces Faces Pain Scale: Hurts little more Pain Location: abdominal pain Pain Descriptors / Indicators: Aching Pain Intervention(s): Monitored during session    Home Living Family/patient expects to be discharged to:: Private residence Living Arrangements: Alone Available Help at Discharge: Family;Available PRN/intermittently Type of Home: House Home Access: Stairs to enter Entrance Stairs-Rails: Psychiatric nurse of Steps: 2 Home Layout: One level Home Equipment: Walker - 2 wheels;Cane - single point      Prior Function Level of Independence: Independent         Comments: 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, was working in Electronics engineer        Extremity/Trunk Assessment   Upper Extremity Assessment Upper Extremity Assessment: Generalized weakness    Lower Extremity Assessment Lower Extremity Assessment: Generalized weakness    Cervical / Trunk Assessment Cervical / Trunk Assessment: Kyphotic  Communication   Communication: No difficulties  Cognition   Behavior During Therapy: WFL for tasks assessed/performed Overall Cognitive Status: Within Functional Limits for tasks assessed  General Comments General comments (skin integrity, edema, etc.): SpO2 down to 84% on RA while ambulating, returning to 92% on 2L O2 [name]    Exercises     Assessment/Plan    PT Assessment Patient needs continued PT  services  PT Problem List Decreased strength;Decreased mobility;Decreased activity tolerance;Decreased balance;Decreased knowledge of use of DME;Decreased safety awareness       PT Treatment Interventions DME instruction;Therapeutic activities;Gait training;Therapeutic exercise;Patient/family education;Functional mobility training;Stair training;Balance training    PT Goals (Current goals can be found in the Care Plan section)  Acute Rehab PT Goals Patient Stated Goal: Return home and hopefully back to work PT Goal Formulation: With patient Time For Goal Achievement: 02/12/19 Potential to Achieve Goals: Good    Frequency Min 3X/week   Barriers to discharge Decreased caregiver support      Co-evaluation               AM-PAC PT "6 Clicks" Mobility  Outcome Measure Help needed turning from your back to your side while in a flat bed without using bedrails?: None Help needed moving from lying on your back to sitting on the side of a flat bed without using bedrails?: None Help needed moving to and from a bed to a chair (including a wheelchair)?: A Little Help needed standing up from a chair using your arms (e.g., wheelchair or bedside chair)?: A Little Help needed to walk in hospital room?: A Little Help needed climbing 3-5 steps with a railing? : A Little 6 Click Score: 20    End of Session Equipment Utilized During Treatment: Gait belt Activity Tolerance: Patient tolerated treatment well;Patient limited by fatigue Patient left: in chair;with call bell/phone within reach;with chair alarm set Nurse Communication: Mobility status PT Visit Diagnosis: Other abnormalities of gait and mobility (R26.89);Muscle weakness (generalized) (M62.81)    Time: 1505-6979 PT Time Calculation (min) (ACUTE ONLY): 16 min   Charges:   PT Evaluation $PT Eval Moderate Complexity: [place], PT, DPT Acute Rehabilitation Services  Pager [phone] Office  [place] 01/29/2019, 11:22 AM

## 2019-01-29 NOTE — Progress Notes (Signed)
PROGRESS NOTE  Stephanie Yates TTS:[id number] DOB: 12-30-1945 DOA: 01/27/2019 PCP: Josetta Huddle, MD   LOS: 0 days   Brief narrative: Patient is a 73 y.o.femalewith PMH significant for HTN, HLD, NICM with EF 15% s/p ICD,pAF, CKD stage IV, chronic anemia.   She presented to the ED on 8/2 with complaint of worsening abdominal pain, intermittent, epigastric, associated with nausea and no urinary symptoms.Patient apparently underwent endoscopy on 7/28 which revealed inflammation within the mucosa of the gastric antrum. In the ED, patient was afebrile, hemodynamically stable. Work-up showed normal WBC count, hemoglobin 11.9, no significant change from baseline. Lipase level normal at 59, LFTs normal. CT abdomen pelvis showed no acute abnormality,no evidence of perforation. Patient received morphine, Zofran, Protonix, Phenergan, and GI cocktail but continued to have nausea and vomiting.  Patient was kept in observation for evaluation of abdominal pain.    Subjective: Patient was seen and examined this morning.  Sitting up in chair.  Not in distress.  On 2 L oxygen via nasal cannula.  Patient does not use oxygen at home. Nauseated, had one episode of vomiting earlier.  On full liquid diet.  Continues to have abdominal pain and tenderness.  No fever.  Blood pressure stable.   Assessment/Plan:  Principal Problem:   Gastritis Active Problems:   Hypertension   PAF (paroxysmal atrial fibrillation) (HCC)   CKD (chronic kidney disease) stage 4, GFR 15-29 ml/min (HCC)   Epigastric abdominal pain  Intractable abdominal pain/ acute worsening of chronic abdominal pain -Patient has history of chronic abdominal pain.  She had endoscopy on 7/28 which showed mild gastritis only.  Lipase level normal.  CT scan of abdomen was negative for any intra-abdominal pathology.   -Unclear cause of intractable abdominal pain and nausea.   -Was maintained n.p.o. on admission.  Full liquid diet was started.   Patient was to advance diet.  Will advance to soft diet. -IV Protonix 40 mg twice daily -GI cocktail PRN -IV morphine PRN -IV Zofran PRN, IV Phenergan PRN  Cardiovascular issues: NICM EF 15%, HTN, HLD, pAF, CKD4 -Prior to admission, patient was on Coreg, Eliquis, Lasix 80 mg daily, hydralazine 12.5 mg 3 times daily, Imdur 15 mg daily, metolazone 2.5 mg daily, mexiletine 200 mg daily, statin.  Apparently she was also supposed to be on but is not taking amiodarone. -Currently rate controlled, blood pressure stable.  Not on CHF exacerbation.  Creatinine at baseline. -I resumed amiodarone, Coreg, Eliquis, mexiletine and statin yesterday.  Resume Lasix today.  Still remains on hold metolazone, hydralazine and Imdur. -We will resume based on blood pressure and volume status.  Chronic anemia -Likely anemia of chronic kidney disease.  Continue iron supplement.  Body mass index is 23.8 kg/m. Mobility: Encourage ambulation Diet:  Soft diet DVT prophylaxis: Eliquis Code Status:  Code Status: Full Code  Family Communication: Expected Discharge:  Patient continues to have abdominal pain, she is hypoxic as well.  She was not requiring oxygen at home.  Currently on 2 to 3 L by nasal cannula.  Needs even higher flow on ambulation.  I switched her to inpatient status. Anticipate discharge home in 1 to 2 days.  Consultants:  None  Procedures:  None  Antimicrobials:  Anti-infectives (From admission, onward)   None      Infusions:    Scheduled Meds: . amiodarone  200 mg Oral Daily  . apixaban  5 mg Oral BID  . atorvastatin  40 mg Oral q1800  . carvedilol  3.125 mg  Oral BID WC  . ferrous gluconate  324 mg Oral BID  . furosemide  80 mg Oral Daily  . mexiletine  200 mg Oral BID  . pantoprazole  40 mg Oral BID AC    PRN meds: acetaminophen **OR** acetaminophen, alum & mag hydroxide-simeth **AND** lidocaine, hydrALAZINE, morphine injection, ondansetron (ZOFRAN) IV,  promethazine   Objective: Vitals:   01/29/19 0828 01/29/19 1503  BP: 123/71 122/76  Pulse: 60 61  Resp:  18  Temp:  97.9 F (36.6 C)  SpO2:  97%    Intake/Output Summary (Last 24 hours) at 01/29/2019 1701 Last data filed at 01/29/2019 0924 Gross per 24 hour  Intake 240 ml  Output 100 ml  Net 140 ml   Filed Weights   01/27/19 2326  Weight: 64.9 kg   Weight change:  Body mass index is 23.8 kg/m.   Physical Exam: General exam: Appears calm and comfortable.  Sitting up in chair. Skin: No rashes, lesions or ulcers. HEENT: Atraumatic, normocephalic, supple neck, no obvious bleeding Lungs: Clear to auscultation bilaterally CVS: Regular rate and rhythm, no murmur GI/Abd soft, mild to moderate tenderness in epigastrium, nondistended, bowel sound present CNS: Alert, awake, oriented x3 Psychiatry: Mood appropriate Extremities: No pedal edema, no calf tenderness  Data Review: I have personally reviewed the laboratory data and studies available.  Recent Labs  Lab 01/23/19 1433 01/28/19 0007 01/29/19 1145  WBC 6.3 5.5 5.0  NEUTROABS  --   --  2.5  HGB 12.6 11.9* 11.2*  HCT 38.7 37.7 36.1  MCV 74.6* 77.6* 79.3*  PLT 218 189 169   Recent Labs  Lab 01/23/19 1433 01/28/19 0007 01/29/19 1145  NA 138 139  --   K 3.6 3.9  --   CL 103 106  --   CO2 25 23  --   GLUCOSE 95 97  --   BUN 27* 32*  --   CREATININE 1.91* 2.05*  --   CALCIUM 9.0 9.2  --   MG  --   --  2.0    [name], MD  [facility] 01/29/2019

## 2019-01-29 NOTE — TOC Initial Note (Addendum)
Transition of Care [facility]) - Initial/Assessment Note    Patient Details  Name: Stephanie Yates MRN: [id number] Date of Birth: 22-Mar-1946  [facility] CM/SW Contact:    Sharin Mons, RN Phone Number: 01/29/2019, 3:10 PM  Clinical Narrative:    Presents with gastritis. From home alone. States daughters live close by and very supportive. Pt states PTA independent with ADL's, DME usage.  Stephanie Yates (Daughter) Stephanie Yates (Daughter)      [phone]         NCM shared PT's eval./recommendations: HHPT. Pt agreeable to [facility] services. Medicare star ratings for home health agencies given. Pt select [facility]. NCM made referral with [facility] pending MD's order. NCM will continue to monitor for TOC needs ...  01/29/2019 1630 [place] liaison accepted for home health services   Expected Discharge Plan: [place] Barriers to Discharge: Continued Medical Work up   Patient Goals and CMS Choice   CMS Medicare.gov Compare Post Acute Care list provided to:: Patient Choice offered to / list presented to : Patient  Expected Discharge Plan and Services Expected Discharge Plan: [place]   Discharge Planning Services: CM Consult   Living arrangements for the past 2 months: Single Family Home                           HH Arranged: PT [facility] Agency: [place] ([facility]) Date HH Agency Contacted: 01/29/19 Time [place]: 1696 Representative spoke with at [place]: [place] Arrangements/Services Living arrangements for the past 2 months: [place] with:: Self Patient language and need for interpreter reviewed:: Yes Do you feel safe going back to the place where you live?: Yes      Need for Family Participation in Patient Care: Yes (Comment) Care giver support system in place?: Yes (comment)   Criminal Activity/Legal Involvement Pertinent to Current Situation/Hospitalization: No -  Comment as needed  Activities of Daily Living      Permission Sought/Granted Permission sought to share information with : Family Supports Permission granted to share information with : Yes, Verbal Permission Granted              Emotional Assessment Appearance:: Appears stated age Attitude/Demeanor/Rapport: Gracious Affect (typically observed): Accepting Orientation: : Oriented to Self, Oriented to Place, Oriented to  Time, Oriented to Situation Alcohol / Substance Use: Not Applicable Psych Involvement: No (comment)  Admission diagnosis:  abd pain, recent endoscopy- Patient Active Problem List   Diagnosis Date Noted  . Gastritis 01/28/2019  . CKD (chronic kidney disease) stage 4, GFR 15-29 ml/min (HCC) 01/28/2019  . VT (ventricular tachycardia) ([place]) 01/17/2019  . AKI (acute kidney injury) ([employer]) 01/12/2019  . Palliative care encounter   . CHF exacerbation ([place]) 01/06/2019  . Influenza A 09/02/2018  . Lactic acidosis 09/02/2018  . Chronic combined systolic and diastolic congestive heart failure ([place]) 09/02/2018  . Rapid atrial fibrillation ([place]) 06/08/2018  . Acute on chronic combined systolic and diastolic heart failure ([place]) 06/07/2018  . Anemia 06/07/2018  . Acute GI bleeding 06/15/2017  . Chronic anticoagulation   . Acute on chronic congestive heart failure ([place])   . HLD (hyperlipidemia) 03/05/2017  . CKD (chronic kidney disease), stage III ([place]) 03/05/2017  . Chest pain 03/05/2017  . Symptomatic anemia 03/05/2017  . PAF (paroxysmal atrial fibrillation) ([place]) 04/01/2013  . ICD (implantable cardioverter-defibrillator), dual, [place] 04/01/2013  . PVC's (  premature ventricular contractions) 04/01/2013  . Hypokalemia 12/21/2012  . History of tobacco abuse 12/21/2012  . Elevated troponin 12/21/2012  . Sinus bradycardia 12/21/2012  . Hyperkalemia 12/20/2012  . Nonischemic cardiomyopathy ([place]) 12/20/2012  . UTI (urinary tract infection) 12/20/2012  . Acute on  chronic combined systolic and diastolic CHF (congestive heart failure) ([place]) 12/20/2012  . Ventricular tachycardia ([place]) 12/14/2012  . Hypertension 12/14/2012   PCP:  Josetta Huddle, MD Pharmacy:   [facility] [place], [place] - [place] AT [place] [address] [place] [address] Phone: [phone] Fax: [phone]     Social Determinants of Health (SDOH) Interventions    Readmission Risk Interventions Readmission Risk Prevention Plan 01/24/2019 01/08/2019 09/05/2018  Transportation Screening Complete - Complete  PCP or Specialist Appt within 3-5 Days Complete - Complete  HRI or Home Care Consult Complete Patient refused Complete  Social Work Consult for [place] Planning/Counseling Complete - Complete  Palliative Care Screening Not Applicable Not Applicable Not Applicable  Medication Review Press photographer) Complete - Not Complete  Med Review Comments - - pt not ready for dc/ rn to complete when pt is ready for dc  Some recent data might be hidden

## 2019-01-30 ENCOUNTER — Encounter (HOSPITAL_COMMUNITY): Payer: Self-pay

## 2019-01-30 ENCOUNTER — Other Ambulatory Visit: Payer: Self-pay

## 2019-01-30 DIAGNOSIS — I5022 Chronic systolic (congestive) heart failure: Secondary | ICD-10-CM

## 2019-01-30 DIAGNOSIS — I480 Paroxysmal atrial fibrillation: Secondary | ICD-10-CM

## 2019-01-30 DIAGNOSIS — I10 Essential (primary) hypertension: Secondary | ICD-10-CM

## 2019-01-30 LAB — BASIC METABOLIC PANEL
Anion gap: 8 (ref 5–15)
BUN: 25 mg/dL — ABNORMAL HIGH (ref 8–23)
CO2: 27 mmol/L (ref 22–32)
Calcium: 8.6 mg/dL — ABNORMAL LOW (ref 8.9–10.3)
Chloride: 106 mmol/L (ref 98–111)
Creatinine, Ser: 1.82 mg/dL — ABNORMAL HIGH (ref 0.44–1.00)
GFR calc Af Amer: 31 mL/min — ABNORMAL LOW (ref >60)
GFR calc non Af Amer: 27 mL/min — ABNORMAL LOW (ref >60)
Glucose, Bld: 89 mg/dL (ref 70–99)
Potassium: 4.3 mmol/L (ref 3.5–5.1)
Sodium: 141 mmol/L (ref 135–145)

## 2019-01-30 NOTE — Discharge Instructions (Signed)

## 2019-01-30 NOTE — Discharge Summary (Signed)
Physician Discharge Summary  NADEZHDA POLLITT IOX:[id number] DOB: Apr 24, 1946 DOA: 01/27/2019  PCP: Josetta Huddle, MD  Admit date: 01/27/2019 Discharge date: 01/30/2019  Admitted From: Home  Disposition:  Home   Recommendations for Outpatient Follow-up:  1. Follow up with Dr. Caryl Comes in 2 weeks 2. Follow up with Dr. Inda Merlin in 1 week 3. Please obtain BMP in one week to follow renal function and K 4. Dr. Caryl Comes: Patient was seen in HF team while hospitalized in early July, was to have seen them in office, but was rehospitalized before this could happen --> if needed, please refer patient to [facility] again 5. Dr. Inda Merlin: Please refer to [place]: Yes  Equipment/Devices: None  Discharge Condition: Poor  CODE STATUS: FULL Diet recommendation: Cardiac  Brief/Interim Summary: Mrs. Ivan is a 73 y.o. F with NICM and sCHF EF 15-20%, ICD-BiV in place, CKD stage IV, and pAF on Eliquis who presented with abdominal discomfort.  The patient had recently been admitted for exertional symptoms to the hospitalist service, found to have EF further reduced from previous now down to 15-20%, was evaluated by the CHF team, medically optimized and referred to CHF clinic at discharge, as well as Palliative Care evaluated her in hospital, given she is clearly not AHF therapy candidate.    After discharge, she had recurrent ICD fires, was referred to the ER where she was admitted to the EP team.  There she had mexiletine added, pacer reprogrammed.  She did miss her CHF clinic appointment while readmitted.  Also had an EGD for complaints of abdominal pain, which showed gastritis only.  Since discharge, she has had no improvement.  She has had mild abdominal pain, vague in character, so she was brought to the ER where she was admitted for observation.     PRINCIPAL HOSPITAL DIAGNOSIS: End stage heart failure    Discharge Diagnoses:   Abdominal pain This is vague in character, and  protean in her recounting of it.  She has chronic abdominal pain, some of which is clearly her known gastritis, for which she is treated with PPI, and for which I encouraged Maalox and a bland diet in addition.  Possibly addition of mexiletine has worsened this, or instead she is describing an anginal equivalent, given her end stage heart failure.  Her gallbladder and LFTs were normal.  By day of discharge, abdominal pain was tolerable, she ate a normal diet.   End stage heart failure Patient has been seen by CHF team who attempted medical optimization.  Clearly she is  Palliative Care.  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 -Encouraged follow up with CHF clinic and Palliative Care, she prefers however, to follow up with Dr. Caryl Comes and Dr. Inda Merlin alone  Hypoxia From CHF.  Weaned to room air at rest on day of discharge.  Recommended home O2, patient declined.   Hypertension  Paroxysmal atrial fibrillation  Chronic kidney disease stage IV  Anemia of chronic kdiney disease       Discharge Instructions  Discharge Instructions    Diet - low sodium heart healthy   Complete by: As directed    Discharge instructions   Complete by: As directed    From Drs. Danford and Dahal: You were admitted for abdominal pain.  Here, the CAT scan of your stomach showed no problems with the intestines, stomach, liver  or gallbladder, nor anything else to explain your symptoms.  Your lab tests also showed no signs of infection, new heart injury or heart attack, worsening heart failure, or gallbladder disease.  The possibilities at this point are: Muscle soreness from coughing Mexitil (your new heart medicine) causing belly discomfort or nausea The abdomen pain is actually coming from your heart.  Not a heart attack (we ruled that out) but pain from your chronic heart failure. If this last thing is the case, it may not  be correctable, as Dr. Caryl Comes has placed you on an excellent heart regimen.   Please follow up with Dr. Caryl Comes next week. Please call Dr. Geraldo Docker office for an appointment asap  Discuss with Dr. Caryl Comes if you need to follow up with the heart failure clinic  Resume all your previous medicines   Increase activity slowly   Complete by: As directed      Allergies as of 01/30/2019      Reactions   Strawberry Extract Hives, Itching, Swelling, Other (See Comments)   Hives and itching      Medication List    TAKE these medications   acetaminophen 500 MG tablet Commonly known as: TYLENOL Take 1,000 mg by mouth every 6 (six) hours as needed for mild pain.   amiodarone 200 MG tablet Commonly known as: Pacerone Take 1 tablet (200 mg total) by mouth daily. Start taking on: February 15, 2019   apixaban 5 MG Tabs tablet Commonly known as: Eliquis Take 1 tablet (5 mg total) by mouth 2 (two) times daily.   atorvastatin 40 MG tablet Commonly known as: LIPITOR Take 1 tablet (40 mg total) by mouth daily at 6 PM.   carvedilol 3.125 MG tablet Commonly known as: COREG Take 1 tablet (3.125 mg total) by mouth 2 (two) times daily with a meal.   ferrous gluconate 324 MG tablet Commonly known as: FERGON Take 324 mg by mouth 2 (two) times daily.   furosemide 80 MG tablet Commonly known as: LASIX Take 1 tablet (80 mg total) by mouth daily.   hydrALAZINE 25 MG tablet Commonly known as: APRESOLINE Take 0.5 tablets (12.5 mg total) by mouth 3 (three) times daily.   isosorbide mononitrate 30 MG 24 hr tablet Commonly known as: IMDUR Take 0.5 tablets (15 mg total) by mouth daily.   levalbuterol 45 MCG/ACT inhaler Commonly known as: XOPENEX HFA Inhale 1 puff into the lungs every 4 (four) hours as needed for wheezing.   magnesium oxide 400 (241.3 Mg) MG tablet Commonly known as: MAG-OX Take 1 tablet (400 mg total) by mouth daily.   metolazone 2.5 MG tablet Commonly known as: ZAROXOLYN Take 1  tablet (2.5 mg total) by mouth daily as needed (leg swellings or shortness of breath or weight gain>5 pounds in 48 hours).   mexiletine 200 MG capsule Commonly known as: MEXITIL Take 1 capsule (200 mg total) by mouth 2 (two) times daily.   nitroGLYCERIN 0.4 MG SL tablet Commonly known as: NITROSTAT Place 1 tablet (0.4 mg total) under the tongue every 5 (five) minutes as needed for chest pain.   pantoprazole 40 MG tablet Commonly known as: PROTONIX Take 1 tablet (40 mg total) by mouth 2 (two) times daily before a meal.   VITAMIN D-3 PO Take 1,000 Units by mouth at bedtime.      Follow-up Information    Deboraha Sprang, MD. Go in 1 week(s).   Specialty: Cardiology Contact information: [address]  [place]        Josetta Huddle, MD. Call.   Specialty: Internal Medicine Why: Call for an appointment before the 21st Contact information: [address] [place] [address] [phone]          Allergies  Allergen Reactions  . Strawberry Extract Hives, Itching, Swelling and Other (See Comments)    Hives and itching     Consultations:  None   Procedures/Studies: Ct Abdomen Pelvis Wo Contrast  Result Date: 01/28/2019 CLINICAL DATA:  Central abdominal pain.  Recent endoscopy. EXAM: CT ABDOMEN AND PELVIS WITHOUT CONTRAST TECHNIQUE: Multidetector CT imaging of the abdomen and pelvis was performed following the standard protocol without IV contrast. COMPARISON:  CT abdomen pelvis 01/23/2019 FINDINGS: LOWER CHEST: There is no basilar pleural or apical pericardial effusion. Mild cardiomegaly. HEPATOBILIARY: The hepatic contours and density are normal. There is no intra- or extrahepatic biliary dilatation. The gallbladder is normal. PANCREAS: The pancreatic parenchymal contours are normal and there is no ductal dilatation. There is no peripancreatic fluid collection. SPLEEN: Normal. ADRENALS/URINARY TRACT: --Adrenal  glands: There is a right adrenal lesion measuring 36 x 23 mm with an attenuation of -15 HU. --Right kidney/ureter: Single nonobstructing renal calculus measuring 4 mm. No hydronephrosis, perinephric stranding or solid renal mass. --Left kidney/ureter: Single nonobstructing renal calculus measuring 3 mm. No hydronephrosis, perinephric stranding or solid renal mass. --Urinary bladder: Normal for degree of distention STOMACH/BOWEL: --Stomach/Duodenum: There is no hiatal hernia or other gastric abnormality. The duodenal course and caliber are normal. --Small bowel: No dilatation or inflammation. --Colon: No focal abnormality. --Appendix: Not visualized. No right lower quadrant inflammation or free fluid. VASCULAR/LYMPHATIC: There is aortic atherosclerosis without hemodynamically significant stenosis. No abdominal or pelvic lymphadenopathy. REPRODUCTIVE: 18 mm calcified uterine fibroid. MUSCULOSKELETAL. Grade 1 anterolisthesis at L4-5. OTHER: None. IMPRESSION: 1. No acute abdominal or pelvic abnormality. 2. Mild cardiomegaly. 3. Bilateral nonobstructive nephrolithiasis. 4. Right adrenal myelolipoma. Electronically Signed   By: Ulyses Jarred M.D.   On: 01/28/2019 02:47   Ct Abdomen Pelvis Wo Contrast  Result Date: 01/23/2019 CLINICAL DATA:  Abdominal pain, nausea/vomiting EXAM: CT ABDOMEN AND PELVIS WITHOUT CONTRAST TECHNIQUE: Multidetector CT imaging of the abdomen and pelvis was performed following the standard protocol without IV contrast. COMPARISON:  CT abdomen dated 07/12/2017 FINDINGS: Lower chest: Mild atelectasis at the bilateral lung bases. Cardiomegaly.  ICD leads, incompletely visualized. Hepatobiliary: Mildly nodular hepatic contour, suggesting cirrhosis. Cholelithiasis, without associated inflammatory changes. No intrahepatic or extrahepatic ductal dilatation. Pancreas: Within normal limits. Spleen: Within normal limits. Adrenals/Urinary Tract: 3.8 cm low-density right adrenal nodule (series 3/image 21),  likely reflecting a benign adrenal adenoma. Left adrenal glands are within normal limits. 3 mm nonobstructing left lower pole renal calculus (series 3/image 35). 5 mm nonobstructing right lower pole renal calculus (series 3/image 38). No hydronephrosis. Bladder is within normal limits. Stomach/Bowel: Stomach is within normal limits. No evidence of bowel obstruction. Normal appendix (coronal image 40). Extensive colonic diverticulosis, without evidence of diverticulitis. Vascular/Lymphatic: No evidence of abdominal aortic aneurysm. Atherosclerotic calcifications of the abdominal aorta and branch vessels. No suspicious abdominopelvic lymphadenopathy. Reproductive: Uterine fibroids. Bilateral ovaries are unremarkable. Other: No abdominopelvic ascites. Small right inguinal hernia containing a loop of nondilated small bowel (series 3/image 33). Musculoskeletal: Mild degenerative changes of the visualized thoracolumbar spine. Grade 1 anterolisthesis at L5-S1. IMPRESSION: No evidence of bowel obstruction. Normal appendix. Extensive colonic diverticulosis, without evidence of diverticulitis. Bilateral nonobstructing renal calculi, measuring up to 5 mm. No hydronephrosis. Small right inguinal  hernia containing a loop of nondilated small bowel. Cirrhosis. Cholelithiasis. Benign right adrenal adenoma. Uterine fibroids. Electronically Signed   By: Julian Hy M.D.   On: 01/23/2019 21:28   US Renal  Result Date: 01/11/2019 CLINICAL DATA:  Acute kidney injury. EXAM: RENAL / URINARY TRACT ULTRASOUND COMPLETE COMPARISON:  None. FINDINGS: Right Kidney: Renal measurements: 10.3 x 5.8 x 3.1 cm = volume: 97 mL . Echogenicity within normal limits. 4 mm lower pole calculus. Two small cysts. No mass or hydronephrosis. Left Kidney: Renal measurements: 7.8 x 4.5 x 4.0 cm = volume: 74 mL. Echogenicity within normal limits. Two small cysts. No mass or hydronephrosis visualized. Bladder: Appears normal for degree of bladder  distention. IMPRESSION: 1. Mild left renal atrophy. 2. No hydronephrosis or renal echotexture abnormality. 3. 4 mm nonobstructing lower pole right renal calculus. Electronically Signed   By: Claudie Revering M.D.   On: 01/11/2019 17:34   Dg Chest Port 1 View  Result Date: 01/07/2019 CLINICAL DATA:  Short of breath.  History of CHF. EXAM: PORTABLE CHEST 1 VIEW COMPARISON:  01/05/2019 and older exams. FINDINGS: Mild to moderate enlargement of the cardiopericardial silhouette. No mediastinal or hilar masses. No convincing adenopathy. Prominent bronchovascular markings with additional lung base reticular opacities chronic and similar to prior exams. No evidence of pneumonia or pulmonary edema. No pleural effusion or pneumothorax. Left anterior chest wall sequential pacemaker is stable. Skeletal structures are grossly intact. IMPRESSION: 1. No acute cardiopulmonary disease. 2. Stable mild-to-moderate cardiomegaly. Electronically Signed   By: Lajean Manes M.D.   On: 01/07/2019 08:27   Dg Chest Port 1 View  Result Date: 01/05/2019 CLINICAL DATA:  Shortness of breath and chest pain EXAM: PORTABLE CHEST 1 VIEW COMPARISON:  09/02/2018 FINDINGS: Cardiac shadow is enlarged. Defibrillator is again noted and stable. Aortic calcifications are seen. Vascular congestion is noted with interstitial edema. No sizable effusion is noted. IMPRESSION: Changes of mild CHF. Electronically Signed   By: Inez Catalina M.D.   On: 01/05/2019 22:06       Subjective: Feels tired.  Mild lower abdominal pain, worse with coughing or worse with exertion.  Not affected by food.  Appetite okay.  No fever.  Discharge Exam: Vitals:   01/30/19 0656 01/30/19 0812  BP:  131/66  Pulse:  61  Resp:    Temp:    SpO2: 94%    Vitals:   01/29/19 2100 01/30/19 0421 01/30/19 0656 01/30/19 0812  BP: (!) 115/92 137/63  131/66  Pulse: (!) 57 (!) 59  61  Resp: 18 18    Temp: 98.5 F (36.9 C) 98.8 F (37.1 C)    TempSrc: Oral Oral    SpO2:  94% 96% 94%   Weight:      Height:        General: Pt is alert, awake, not in acute distress Cardiovascular: RRR, nl S1-S2, no murmurs appreciated.   No LE edema.  JVP normal. Respiratory: Normal respiratory rate and rhythm.  CTAB without rales or wheezes. Abdominal: Abdomen soft.  Mild RUQ pain, negative Murphy's.   No distension or HSM.   Neuro/Psych: Strength symmetric in upper and lower extremities.  Judgment and insight appear normal.   The results of significant diagnostics from this hospitalization (including imaging, microbiology, ancillary and laboratory) are listed below for reference.     Microbiology: Recent Results (from the past 240 hour(s))  SARS CORONAVIRUS 2 Nasal Swab Aptima Multi Swab     Status: None   Collection Time: 01/28/19  3:33 AM   Specimen: Aptima Multi Swab; Nasal Swab  Result Value Ref Range Status   SARS Coronavirus 2 NEGATIVE NEGATIVE Final    Comment: (NOTE) SARS-CoV-2 target nucleic acids are NOT DETECTED. The SARS-CoV-2 RNA is generally detectable in upper and lower respiratory specimens during the acute phase of infection. Negative results do not preclude SARS-CoV-2 infection, do not rule out co-infections with other pathogens, and should not be used as the sole basis for treatment or other patient management decisions. Negative results must be combined with clinical observations, patient history, and epidemiological information. The expected result is Negative. Fact Sheet for Patients: SugarRoll.be Fact Sheet for Healthcare Providers: https://www.woods-mathews.com/ This test is not yet approved or cleared by the [place] FDA and  has been authorized for detection and/or diagnosis of SARS-CoV-2 by FDA under an Emergency Use Authorization (EUA). This EUA will remain  in effect (meaning this test can be used) for the duration of the COVID-19 declaration under Section 56 4(b)(1) of the Act, 21  U.S.C. section 360bbb-3(b)(1), unless the authorization is terminated or revoked sooner. Performed at [facility], [place] [address] [place], [place] [address]      Labs: BNP (last 3 results) Recent Labs    09/02/18 1541 01/05/19 2213 01/10/19 0422  BNP 819.2* 1,129.5* 3,810.1*   Basic Metabolic Panel: Recent Labs  Lab 01/23/19 1433 01/28/19 0007 01/29/19 1145 01/30/19 0207  NA 138 139  --  141  K 3.6 3.9  --  4.3  CL 103 106  --  106  CO2 25 23  --  27  GLUCOSE 95 97  --  89  BUN 27* 32*  --  25*  CREATININE 1.91* 2.05*  --  1.82*  CALCIUM 9.0 9.2  --  8.6*  MG  --   --  2.0  --    Liver Function Tests: Recent Labs  Lab 01/28/19 0007  AST 54*  ALT 26  ALKPHOS 83  BILITOT 1.1  PROT 7.2  ALBUMIN 3.3*   Recent Labs  Lab 01/28/19 0007  LIPASE 59*   No results for input(s): AMMONIA in the last 168 hours. CBC: Recent Labs  Lab 01/23/19 1433 01/28/19 0007 01/29/19 1145  WBC 6.3 5.5 5.0  NEUTROABS  --   --  2.5  HGB 12.6 11.9* 11.2*  HCT 38.7 37.7 36.1  MCV 74.6* 77.6* 79.3*  PLT 218 189 169   Cardiac Enzymes: No results for input(s): CKTOTAL, CKMB, CKMBINDEX, TROPONINI in the last 168 hours. BNP: Invalid input(s): POCBNP CBG: No results for input(s): GLUCAP in the last 168 hours. D-Dimer No results for input(s): DDIMER in the last 72 hours. Hgb A1c No results for input(s): HGBA1C in the last 72 hours. Lipid Profile No results for input(s): CHOL, HDL, LDLCALC, TRIG, CHOLHDL, LDLDIRECT in the last 72 hours. Thyroid function studies No results for input(s): TSH, T4TOTAL, T3FREE, THYROIDAB in the last 72 hours.  Invalid input(s): FREET3 Anemia work up No results for input(s): VITAMINB12, FOLATE, FERRITIN, TIBC, IRON, RETICCTPCT in the last 72 hours. Urinalysis    Component Value Date/Time   COLORURINE YELLOW 01/28/2019 0939   APPEARANCEUR HAZY (A) 01/28/2019 0939   LABSPEC 1.015 01/28/2019 0939   PHURINE 6.0 01/28/2019 0939    GLUCOSEU NEGATIVE 01/28/2019 0939   HGBUR LARGE (A) 01/28/2019 0939   BILIRUBINUR NEGATIVE 01/28/2019 0939   KETONESUR NEGATIVE 01/28/2019 0939   PROTEINUR 30 (A) 01/28/2019 0939   UROBILINOGEN 1.0 08/29/2014 2035   NITRITE NEGATIVE  01/28/2019 0939   LEUKOCYTESUR NEGATIVE 01/28/2019 0939   Sepsis Labs Invalid input(s): PROCALCITONIN,  WBC,  LACTICIDVEN Microbiology Recent Results (from the past 240 hour(s))  SARS CORONAVIRUS 2 Nasal Swab Aptima Multi Swab     Status: None   Collection Time: 01/28/19  3:33 AM   Specimen: Aptima Multi Swab; Nasal Swab  Result Value Ref Range Status   SARS Coronavirus 2 NEGATIVE NEGATIVE Final    Comment: (NOTE) SARS-CoV-2 target nucleic acids are NOT DETECTED. The SARS-CoV-2 RNA is generally detectable in upper and lower respiratory specimens during the acute phase of infection. Negative results do not preclude SARS-CoV-2 infection, do not rule out co-infections with other pathogens, and should not be used as the sole basis for treatment or other patient management decisions. Negative results must be combined with clinical observations, patient history, and epidemiological information. The expected result is Negative. Fact Sheet for Patients: SugarRoll.be Fact Sheet for Healthcare Providers: https://www.woods-mathews.com/ This test is not yet approved or cleared by the [place] FDA and  has been authorized for detection and/or diagnosis of SARS-CoV-2 by FDA under an Emergency Use Authorization (EUA). This EUA will remain  in effect (meaning this test can be used) for the duration of the COVID-19 declaration under Section 56 4(b)(1) of the Act, 21 U.S.C. section 360bbb-3(b)(1), unless the authorization is terminated or revoked sooner. Performed at [facility], [place] [address], [place], [place] [address]      Time coordinating discharge: 40 minutes      SIGNED:   Edwin Dada, MD  [facility] 01/30/2019, 10:23 AM

## 2019-01-30 NOTE — TOC Transition Note (Signed)
Transition of Care [facility]) - CM/SW Discharge Note   Patient Details  Name: Stephanie Yates MRN: [id number] Date of Birth: 03-Feb-1946  [facility]) CM/SW Contact:  Marilu Favre, RN Phone Number: 01/30/2019, 1:47 PM   Clinical Narrative:      Mateo Flow with [facility] aware discharge is today    Barriers to Discharge: Continued Medical Work up   Patient Goals and CMS Choice   CMS Medicare.gov Compare Post Acute Care list provided to:: Patient Choice offered to / list presented to : Patient  Discharge Placement                       Discharge Plan and Services   Discharge Planning Services: CM Consult                      HH Arranged: PT [facility]: [place] ([facility]) Date [facility] Agency Contacted: 01/29/19 Time [place]: 1507 Representative spoke with at [place]: [place] ([facility]) Interventions     Readmission Risk Interventions Readmission Risk Prevention Plan 01/24/2019 01/08/2019 09/05/2018  Transportation Screening Complete - Complete  PCP or Specialist Appt within 3-5 Days Complete - Complete  HRI or Home Care Consult Complete Patient refused Complete  Social Work Consult for [place] Planning/Counseling Complete - Complete  Palliative Care Screening Not Applicable Not Applicable Not Applicable  Medication Review [employer]) Complete - Not Complete  Med Review Comments - - pt not ready for dc/ rn to complete when pt is ready for dc  Some recent data might be hidden

## 2019-01-30 NOTE — Plan of Care (Signed)
  Problem: Clinical Measurements: Goal: Respiratory complications will improve Outcome: Progressing   Problem: Activity: Goal: Risk for activity intolerance will decrease Outcome: Progressing   Problem: Coping: Goal: Level of anxiety will decrease Outcome: Progressing   Problem: Pain Managment: Goal: General experience of comfort will improve Outcome: Progressing   Problem: Skin Integrity: Goal: Risk for impaired skin integrity will decrease Outcome: Progressing

## 2019-01-30 NOTE — Progress Notes (Signed)
Patient Oxygen was removed at 0600 and placed on room air. At [phone] she was 94% on room air encouraged the patient to utilize the incentive spirometer as often as she can. Arthor Captain LPN

## 2019-02-01 ENCOUNTER — Telehealth: Payer: Self-pay | Admitting: Internal Medicine

## 2019-02-01 NOTE — Telephone Encounter (Signed)
Transmission received 02-01-2019 at 10:59 am

## 2019-02-01 NOTE — Telephone Encounter (Signed)
  STAT if patient feels like he/she is going to faint   1) Are you dizzy now? Has had it this morning but it comes and goes  2) Do you feel faint or have you passed out? no  3) Do you have any other symptoms? no  4) Have you checked your HR and BP (record if available)? No  Daughter states that her mom mostly has the dizziness when she is sitting down. It does happen some when she moves around also.

## 2019-02-01 NOTE — Telephone Encounter (Signed)
Spoke to the pts daughter, Stephanie Yates [phone]... she reports the pt has been having "dizziness" when sitting and when she rises she loses her balance and almost falls down but once she is up she is fine... she does not know the pts vital signs... she denies chest pain, palpitations, sob... she does not have N/V.... I asked if she has a h/o of inner ear problems and she does not... she has been eating and drinking well... no dry mouth or dry skin.   Pt will send a remote transmission to see is she has been having any arrhythmias noted on her ICD and will also call her PCP Dr Inda Merlin today... but daughter says it is difficult to get his office to return same day calls.   Will forward to device and Chanetta Marshall NP for review..   Next appt with [facility] 02/14/19.

## 2019-02-02 NOTE — Telephone Encounter (Signed)
No new VT episodes. Normal device function.   Will ask [facility] to move appt with EP up to this week.  Chanetta Marshall, NP 02/02/2019 10:12 AM

## 2019-02-04 ENCOUNTER — Other Ambulatory Visit: Payer: Self-pay | Admitting: Internal Medicine

## 2019-02-05 ENCOUNTER — Encounter: Payer: Medicare Other | Admitting: Internal Medicine

## 2019-02-07 ENCOUNTER — Encounter: Payer: Medicare Other | Admitting: Student

## 2019-02-08 ENCOUNTER — Other Ambulatory Visit: Payer: Self-pay

## 2019-02-08 ENCOUNTER — Telehealth: Payer: Self-pay | Admitting: Student

## 2019-02-08 ENCOUNTER — Ambulatory Visit (INDEPENDENT_AMBULATORY_CARE_PROVIDER_SITE_OTHER): Payer: Medicare Other | Admitting: Student

## 2019-02-08 VITALS — BP 145/74 | HR 64 | Ht 60.0 in | Wt 133.0 lb

## 2019-02-08 DIAGNOSIS — I472 Ventricular tachycardia, unspecified: Secondary | ICD-10-CM

## 2019-02-08 DIAGNOSIS — I10 Essential (primary) hypertension: Secondary | ICD-10-CM | POA: Diagnosis not present

## 2019-02-08 DIAGNOSIS — I428 Other cardiomyopathies: Secondary | ICD-10-CM

## 2019-02-08 DIAGNOSIS — N184 Chronic kidney disease, stage 4 (severe): Secondary | ICD-10-CM | POA: Diagnosis not present

## 2019-02-08 LAB — CUP PACEART INCLINIC DEVICE CHECK
Battery Remaining Longevity: 34 mo
Brady Statistic RA Percent Paced: 88 %
Brady Statistic RV Percent Paced: 0.24 %
Date Time Interrogation Session: 20200814151707
HighPow Impedance: 66.375
Implantable Lead Implant Date: 20140625
Implantable Lead Implant Date: 20140625
Implantable Lead Location: 753859
Implantable Lead Location: 753860
Implantable Pulse Generator Implant Date: 20140625
Lead Channel Impedance Value: 425 Ohm
Lead Channel Impedance Value: 600 Ohm
Lead Channel Pacing Threshold Amplitude: 1 V
Lead Channel Pacing Threshold Amplitude: 1 V
Lead Channel Pacing Threshold Amplitude: 2.5 V
Lead Channel Pacing Threshold Amplitude: 2.5 V
Lead Channel Pacing Threshold Pulse Width: 0.5 ms
Lead Channel Pacing Threshold Pulse Width: 0.5 ms
Lead Channel Pacing Threshold Pulse Width: 0.8 ms
Lead Channel Pacing Threshold Pulse Width: 0.8 ms
Lead Channel Sensing Intrinsic Amplitude: 11.8 mV
Lead Channel Sensing Intrinsic Amplitude: 3.1 mV
Lead Channel Setting Pacing Amplitude: 2 V
Lead Channel Setting Pacing Amplitude: 3.375
Lead Channel Setting Pacing Pulse Width: 0.6 ms
Lead Channel Setting Sensing Sensitivity: 0.5 mV
Pulse Gen Serial Number: 7093938

## 2019-02-08 NOTE — Telephone Encounter (Signed)
New message     Pt c/o medication issue:  1. Name of Medication: amiodarone (PACERONE)   2. How are you currently taking this medication (dosage and times per day)? n/a  3. Are you having a reaction (difficulty breathing--STAT)? no  4. What is your medication issue? Patients daughter Magda Paganini calling to clarify dosage

## 2019-02-08 NOTE — Patient Instructions (Signed)
Medication Instructions:  Your physician recommends that you continue on your current medications as directed. Please refer to the Current Medication list given to you today.  If you need a refill on your cardiac medications before your next appointment, please call your pharmacy.   Lab work: NONE ORDERED  TODAY  If you have labs (blood work) drawn today and your tests are completely normal, you will receive your results only by: Marland Kitchen MyChart Message (if you have MyChart) OR . A paper copy in the mail If you have any lab test that is abnormal or we need to change your treatment, we will call you to review the results.  Testing/Procedures: NONE ORDERED  TODAY   Follow-Up: At [facility], you and your health needs are our priority.  As part of our continuing mission to provide you with exceptional heart care, we have created designated Provider Care Teams.  These Care Teams include your primary Cardiologist (physician) and Advanced Practice Providers (APPs -  Physician Assistants and Nurse Practitioners) who all work together to provide you with the care you need, when you need it. You will need a follow up appointment in 6 months.  Please call our office 2 months in advance to schedule this appointment.  You may see Virl Axe, MD or one of the following Advanced Practice Providers on your designated Care Team:   Chanetta Marshall, NP . Tommye Standard, PA-C . Joesph July PA-C  Any Other Special Instructions Will Be Listed Below (If Applicable).

## 2019-02-08 NOTE — Telephone Encounter (Signed)
Discussed with daughter. Pt HAS been taking amiodarone, daughter has been setting up her pill boxes and they are doing the taper as originally prescribed after her recent admission for VT. No change to dosage at this time.   Pts daughter also is requesting if Dr. Caryl Comes will "write her out of work" due to her increased risk of COVID.  Dr. Caryl Comes wrote her a letter 10/09/2018 indicating that she is at higher risk and should avoid high risk environments. I told her I wasn't sure we could be any more specific than that, but I would request any additional clarification from him if needed.    Legrand Como [address] [place], [place] 02/08/2019 6:19 PM

## 2019-02-08 NOTE — Progress Notes (Signed)
Electrophysiology Office Note Date: 02/08/2019  ID:  Stephanie, Yates 27-Sep-1945, MRN [id number]  PCP: Stephanie Huddle, MD Primary Cardiologist: No primary care provider on file. Electrophysiologist: Stephanie Axe, MD 2 CC: Routine ICD follow-up  Stephanie Yates is a 73 y.o. female seen today for Dr. Caryl Yates. She presents today for post hospital follow up for recent admission for VT.  She does not think she has been taking amiodarone. She denies chest pain, palpitations, dyspnea, PND, orthopnea, nausea, vomiting, dizziness, syncope, edema, weight gain, or early satiety. She has not had any additional ICD therapies.   Device History: St. Surveyor, minerals ICD implanted 2014 for secondary prevention.  History of appropriate therapy: Yes History of AAD therapy: Yes   Past Medical History:  Diagnosis Date  . Chronic anemia   . Chronic systolic CHF (congestive heart failure) ([place])   . CKD (chronic kidney disease), stage IV ([place])   . Former tobacco use   . History of tobacco abuse   . Hyperlipidemia   . Hypertension   . Mitral regurgitation   . Nonischemic cardiomyopathy ([place])    a. EF 10-15% on 11/2012 cath with no significant CAD. b. EF 25% in 2019. b. EF 15% in 12/2018  . PAF (paroxysmal atrial fibrillation) ([place])   . Protein calorie malnutrition ([place])   . Quit consuming alcohol in remote past   . Sinus bradycardia 12/21/2012  . Ventricular tachycardia ([place]) 12/14/2012   Past Surgical History:  Procedure Laterality Date  . BIOPSY  01/22/2019   Procedure: BIOPSY;  Surgeon: Stephanie Corner, MD;  Location: [facility];  Service: Endoscopy;;  . ESOPHAGOGASTRODUODENOSCOPY (EGD) WITH PROPOFOL N/A 06/18/2017   Procedure: ESOPHAGOGASTRODUODENOSCOPY (EGD) WITH PROPOFOL;  Surgeon: Stephanie Juniper, MD;  Location: [facility];  Service: Gastroenterology;  Laterality: N/A;  . ESOPHAGOGASTRODUODENOSCOPY (EGD) WITH PROPOFOL N/A 01/22/2019   Procedure: ESOPHAGOGASTRODUODENOSCOPY (EGD) WITH  PROPOFOL;  Surgeon: Stephanie Corner, MD;  Location: [facility];  Service: Endoscopy;  Laterality: N/A;  . IMPLANTABLE CARDIOVERTER DEFIBRILLATOR IMPLANT  12/19/2012   St. Jude dual-chamber ICD, serial number [id number]  . IMPLANTABLE CARDIOVERTER DEFIBRILLATOR IMPLANT N/A 12/19/2012   Procedure: IMPLANTABLE CARDIOVERTER DEFIBRILLATOR IMPLANT;  Surgeon: Stephanie Sprang, MD;  Location: [facility] CATH LAB;  Service: Cardiovascular;  Laterality: N/A;  . LEFT AND RIGHT HEART CATHETERIZATION WITH CORONARY ANGIOGRAM N/A 12/17/2012   Procedure: LEFT AND RIGHT HEART CATHETERIZATION WITH CORONARY ANGIOGRAM;  Surgeon: Stephanie Mocha, MD;  Location: [facility] CATH LAB;  Service: Cardiovascular;  Laterality: N/A;  . SCLEROTHERAPY  01/22/2019   Procedure: SCLEROTHERAPY;  Surgeon: Stephanie Corner, MD;  Location: [facility] ENDOSCOPY;  Service: Endoscopy;;    Current Outpatient Medications  Medication Sig Dispense Refill  . acetaminophen (TYLENOL) 500 MG tablet Take 1,000 mg by mouth every 6 (six) hours as needed for mild pain.    Derrill Memo ON 02/15/2019] amiodarone (PACERONE) 200 MG tablet Take 1 tablet (200 mg total) by mouth daily. 30 tablet 6  . apixaban (ELIQUIS) 5 MG TABS tablet Take 1 tablet (5 mg total) by mouth 2 (two) times daily. 60 tablet 2  . atorvastatin (LIPITOR) 40 MG tablet Take 1 tablet (40 mg total) by mouth daily at 6 PM. 30 tablet 0  . carvedilol (COREG) 3.125 MG tablet Take 1 tablet (3.125 mg total) by mouth 2 (two) times daily with a meal. 180 tablet 2  . Cholecalciferol (VITAMIN D-3 PO) Take 1,000 Units by mouth at bedtime.     . ferrous gluconate (FERGON) 324 MG tablet Take  324 mg by mouth 2 (two) times daily.     . furosemide (LASIX) 80 MG tablet Take 1 tablet (80 mg total) by mouth daily. 90 tablet 2  . hydrALAZINE (APRESOLINE) 25 MG tablet Take 0.5 tablets (12.5 mg total) by mouth 3 (three) times daily. 30 tablet 1  . isosorbide mononitrate (IMDUR) 30 MG 24 hr tablet Take 0.5 tablets (15 mg total) by mouth  daily. 30 tablet 1  . levalbuterol (XOPENEX HFA) 45 MCG/ACT inhaler Inhale 1 puff into the lungs every 4 (four) hours as needed for wheezing. 1 Inhaler 2  . MAGNESIUM-OXIDE 400 (241.3 Mg) MG tablet TAKE 1 TABLET BY MOUTH ONCE DAILY 90 tablet 3  . metolazone (ZAROXOLYN) 2.5 MG tablet Take 1 tablet (2.5 mg total) by mouth daily as needed (leg swellings or shortness of breath or weight gain>5 pounds in 48 hours). 30 tablet 1  . mexiletine (MEXITIL) 200 MG capsule Take 1 capsule (200 mg total) by mouth 2 (two) times daily. 60 capsule 6  . nitroGLYCERIN (NITROSTAT) 0.4 MG SL tablet Place 1 tablet (0.4 mg total) under the tongue every 5 (five) minutes as needed for chest pain. 30 tablet 12  . pantoprazole (PROTONIX) 40 MG tablet Take 1 tablet (40 mg total) by mouth 2 (two) times daily before a meal. 120 tablet 0   No current facility-administered medications for this visit.     Allergies:   Strawberry extract   Social History: Social History   Socioeconomic History  . Marital status: Widowed    Spouse name: Not on file  . Number of children: Not on file  . Years of education: Not on file  . Highest education level: Not on file  Occupational History  . Not on file  Social Needs  . Financial resource strain: Not on file  . Food insecurity    Worry: Not on file    Inability: Not on file  . Transportation needs    Medical: Not on file    Non-medical: Not on file  Tobacco Use  . Smoking status: Former Research scientist (life sciences)  . Smokeless tobacco: Never Used  Substance and Sexual Activity  . Alcohol use: No  . Drug use: No  . Sexual activity: Not Currently  Lifestyle  . Physical activity    Days per week: Not on file    Minutes per session: Not on file  . Stress: Not on file  Relationships  . Social Herbalist on phone: Not on file    Gets together: Not on file    Attends religious service: Not on file    Active member of club or organization: Not on file    Attends meetings of clubs  or organizations: Not on file    Relationship status: Not on file  . Intimate partner violence    Fear of current or ex partner: Not on file    Emotionally abused: Not on file    Physically abused: Not on file    Forced sexual activity: Not on file  Other Topics Concern  . Not on file  Social History Narrative  . Not on file    Family History: Family History  Problem Relation Age of Onset  . Cancer Mother     Review of Systems: All other systems reviewed and are otherwise negative except as noted above.   Physical Exam: Vitals:   02/08/19 1337  BP: (!) 145/74  Pulse: 64  Weight: 133 lb (60.3 kg)  Height: 5' (  1.524 m)     GEN- The patient is well appearing, alert and oriented x 3 today.   HEENT: normocephalic, atraumatic; sclera clear, conjunctiva pink; hearing intact; oropharynx clear; neck supple, no JVP Lymph- no cervical lymphadenopathy Lungs- Clear to ausculation bilaterally, normal work of breathing.  No wheezes, rales, rhonchi Heart- Regular rate and rhythm, no murmurs, rubs or gallops, PMI not laterally displaced GI- soft, non-tender, non-distended, bowel sounds present, no hepatosplenomegaly Extremities- no clubbing, cyanosis, or edema; DP/PT/radial pulses 2+ bilaterally MS- no significant deformity or atrophy Skin- warm and dry, no rash or lesion; ICD pocket well healed Psych- euthymic mood, full affect Neuro- strength and sensation are intact  ICD interrogation- reviewed in detail today,  See PACEART report  EKG:  EKG is not ordered today.  Recent Labs: 01/05/2019: TSH 2.456 01/10/2019: B Natriuretic Peptide 1,735.2 01/28/2019: ALT 26 01/29/2019: Hemoglobin 11.2; Magnesium 2.0; Platelets 169 01/30/2019: BUN 25; Creatinine, Ser 1.82; Potassium 4.3; Sodium 141   Wt Readings from Last 3 Encounters:  02/08/19 133 lb (60.3 kg)  01/27/19 143 lb (64.9 kg)  01/24/19 134 lb 14.4 oz (61.2 kg)     Other studies Reviewed: Additional studies/ records that were  reviewed today include: Recent admission notes, labwork, and previous test results   Assessment and Plan:  1.  Chronic systolic dysfunction euvolemic today Stable on an appropriate medical regimen Normal ICD function See Pace Art repor No changes today  2. VT Recent admission for 5 episodes of VT. Lowest rates 150. Her lowest therapy zone is set to 136 for ATP.  Resume amiodarone at 200 mg daily. Discussed with Dr. Caryl Yates BMET and Mg stable 01/29/2019  3. Paroxysmal atrial fibrillation Low burden by device. She denies bleeding on appropriate dose Eliquis.  4. CKD IV Stable on most recent labs  ADDENDUM: Pt daughter called and pt is taking amiodarone. Will continue taper as previously ordered, and plan to decrease to amiodarone 200 mg daily 02/15/2019   Current medicines are reviewed at length with the patient today.   The patient does not have concerns regarding her medicines.  The following changes were made today:  Restart amiodarone at 200 mg daily.   Labs/ tests ordered today include:  Orders Placed This Encounter  Procedures  . CUP PACEART INCLINIC DEVICE CHECK    Disposition:   Follow up with Dr. Caryl Yates in 6 months. Continue 3 month remote checks.    Jacalyn Lefevre, PA-C  02/08/2019 6:23 PM  [place] [address] [phone] (office) [phone] (fax)

## 2019-02-09 NOTE — Telephone Encounter (Signed)
Agree. thanks

## 2019-02-11 NOTE — Telephone Encounter (Signed)
I discussed with Dr. Caryl Comes and will update her!

## 2019-02-11 NOTE — Telephone Encounter (Signed)
Follow up    Patient is calling about her note. Please call

## 2019-02-11 NOTE — Telephone Encounter (Signed)
Spoke with patient. Per Dr. Caryl Comes we cannot write out recommendations any more specific than the letter she was given in April, and cannot explicitly write her out of work.  We have asked that she continue to use that letter.   Pt verbalized understanding.  Attempted to call back Daughter, [name], but no answer or machine (Just music playing for several minutes)   Legrand Como "[place], [place] 02/11/2019 2:58 PM

## 2019-02-14 ENCOUNTER — Encounter: Payer: Medicare Other | Admitting: Nurse Practitioner

## 2019-02-17 ENCOUNTER — Other Ambulatory Visit: Payer: Self-pay | Admitting: Physician Assistant

## 2019-02-21 ENCOUNTER — Other Ambulatory Visit: Payer: Self-pay | Admitting: Internal Medicine

## 2019-02-23 ENCOUNTER — Emergency Department (HOSPITAL_COMMUNITY): Payer: Medicare Other

## 2019-02-23 ENCOUNTER — Emergency Department (HOSPITAL_COMMUNITY)
Admission: EM | Admit: 2019-02-23 | Discharge: 2019-02-23 | Disposition: A | Discharge status: Home / Self Care | Payer: Medicare Other | Attending: Emergency Medicine | Admitting: Emergency Medicine

## 2019-02-23 ENCOUNTER — Other Ambulatory Visit: Payer: Self-pay

## 2019-02-23 DIAGNOSIS — Z87891 Personal history of nicotine dependence: Secondary | ICD-10-CM | POA: Diagnosis not present

## 2019-02-23 DIAGNOSIS — I5042 Chronic combined systolic (congestive) and diastolic (congestive) heart failure: Secondary | ICD-10-CM | POA: Diagnosis not present

## 2019-02-23 DIAGNOSIS — Z79899 Other long term (current) drug therapy: Secondary | ICD-10-CM | POA: Diagnosis not present

## 2019-02-23 DIAGNOSIS — I130 Hypertensive heart and chronic kidney disease with heart failure and stage 1 through stage 4 chronic kidney disease, or unspecified chronic kidney disease: Secondary | ICD-10-CM | POA: Insufficient documentation

## 2019-02-23 DIAGNOSIS — R1013 Epigastric pain: Secondary | ICD-10-CM | POA: Insufficient documentation

## 2019-02-23 DIAGNOSIS — N184 Chronic kidney disease, stage 4 (severe): Secondary | ICD-10-CM | POA: Insufficient documentation

## 2019-02-23 LAB — CBC WITH DIFFERENTIAL/PLATELET
Abs Immature Granulocytes: 0.01 10*3/uL (ref 0.00–0.07)
Basophils Absolute: 0.1 10*3/uL (ref 0.0–0.1)
Basophils Relative: 1 %
Eosinophils Absolute: 0.1 10*3/uL (ref 0.0–0.5)
Eosinophils Relative: 2 %
HCT: 40.5 % (ref 36.0–46.0)
Hemoglobin: 12.8 g/dL (ref 12.0–15.0)
Immature Granulocytes: 0 %
Lymphocytes Relative: 16 %
Lymphs Abs: 0.8 10*3/uL (ref 0.7–4.0)
MCH: 25.7 pg — ABNORMAL LOW (ref 26.0–34.0)
MCHC: 31.6 g/dL (ref 30.0–36.0)
MCV: 81.2 fL (ref 80.0–100.0)
Monocytes Absolute: 0.8 10*3/uL (ref 0.1–1.0)
Monocytes Relative: 17 %
Neutro Abs: 3.2 10*3/uL (ref 1.7–7.7)
Neutrophils Relative %: 64 %
Platelets: 234 10*3/uL (ref 150–400)
RBC: 4.99 MIL/uL (ref 3.87–5.11)
RDW: 24.6 % — ABNORMAL HIGH (ref 11.5–15.5)
WBC: 5 10*3/uL (ref 4.0–10.5)
nRBC: 0 % (ref 0.0–0.2)

## 2019-02-23 LAB — COMPREHENSIVE METABOLIC PANEL
ALT: 53 U/L — ABNORMAL HIGH (ref 0–44)
AST: 104 U/L — ABNORMAL HIGH (ref 15–41)
Albumin: 3.5 g/dL (ref 3.5–5.0)
Alkaline Phosphatase: 86 U/L (ref 38–126)
Anion gap: 11 (ref 5–15)
BUN: 37 mg/dL — ABNORMAL HIGH (ref 8–23)
CO2: 27 mmol/L (ref 22–32)
Calcium: 9.8 mg/dL (ref 8.9–10.3)
Chloride: 101 mmol/L (ref 98–111)
Creatinine, Ser: 2.16 mg/dL — ABNORMAL HIGH (ref 0.44–1.00)
GFR calc Af Amer: 25 mL/min — ABNORMAL LOW (ref >60)
GFR calc non Af Amer: 22 mL/min — ABNORMAL LOW (ref >60)
Glucose, Bld: 115 mg/dL — ABNORMAL HIGH (ref 70–99)
Potassium: 3.8 mmol/L (ref 3.5–5.1)
Sodium: 139 mmol/L (ref 135–145)
Total Bilirubin: 1.2 mg/dL (ref 0.3–1.2)
Total Protein: 8.8 g/dL — ABNORMAL HIGH (ref 6.5–8.1)

## 2019-02-23 LAB — LIPASE, BLOOD: Lipase: 54 U/L — ABNORMAL HIGH (ref 11–51)

## 2019-02-23 MED ORDER — ONDANSETRON HCL 4 MG/2ML IJ SOLN
4.0000 mg | Freq: Once | INTRAMUSCULAR | Status: AC
  Administered 2019-02-23: 4 mg via INTRAVENOUS
  Filled 2019-02-23: qty 2

## 2019-02-23 MED ORDER — FENTANYL CITRATE (PF) 100 MCG/2ML IJ SOLN
25.0000 ug | Freq: Once | INTRAMUSCULAR | Status: AC
  Administered 2019-02-23: 18:00:00 25 ug via INTRAVENOUS
  Filled 2019-02-23: qty 2

## 2019-02-23 MED ORDER — FAMOTIDINE IN NACL 20-0.9 MG/50ML-% IV SOLN
20.0000 mg | Freq: Once | INTRAVENOUS | Status: AC
  Administered 2019-02-23: 18:00:00 20 mg via INTRAVENOUS
  Filled 2019-02-23: qty 50

## 2019-02-23 MED ORDER — ONDANSETRON 4 MG PO TBDP: 4.0000 mg | ORAL_TABLET | Freq: Three times a day (TID) | ORAL | 0 refills | Status: DC | PRN

## 2019-02-23 NOTE — ED Provider Notes (Signed)
[facility] EMERGENCY DEPARTMENT Provider Note   CSN: [id number] Arrival date & time: 02/23/19  1617     History   Chief Complaint Chief Complaint  Patient presents with  . Abdominal Pain    HPI Stephanie Yates is a 73 y.o. female with past medical history of CHF, CKD, chronic gastritis, hypertension, presenting to the emergency department with complaint of epigastric abdominal pain that began yesterday evening.  She states it worsened somewhat today.  She has had no changes in her bowel movements.  She has had nausea without vomiting.  She describes her pain as very similar to prior episodes of upper abdominal pain for which she was diagnosed with gastritis most recently during a recent admission the beginning of the month.  She had an upper GI done which showed gastritis.  She takes Protonix and has been taking as prescribed.  She denies any fevers, urinary symptoms, or any other associated symptoms.  She states she presents for symptom control.     HPI  Past Medical History:  Diagnosis Date  . Chronic anemia   . Chronic systolic CHF (congestive heart failure) ([place])   . CKD (chronic kidney disease), stage IV ([place])   . Former tobacco use   . History of tobacco abuse   . Hyperlipidemia   . Hypertension   . Mitral regurgitation   . Nonischemic cardiomyopathy ([place])    a. EF 10-15% on 11/2012 cath with no significant CAD. b. EF 25% in 2019. b. EF 15% in 12/2018  . PAF (paroxysmal atrial fibrillation) ([place])   . Protein calorie malnutrition ([place])   . Quit consuming alcohol in remote past   . Sinus bradycardia 12/21/2012  . Ventricular tachycardia ([place]) 12/14/2012    Patient Active Problem List   Diagnosis Date Noted  . Epigastric abdominal pain 01/29/2019  . Gastritis 01/28/2019  . CKD (chronic kidney disease) stage 4, GFR 15-29 ml/min (HCC) 01/28/2019  . VT (ventricular tachycardia) ([place]) 01/17/2019  . AKI (acute kidney injury) ([place]) 01/12/2019  . Palliative  care encounter   . CHF exacerbation ([place]) 01/06/2019  . Influenza A 09/02/2018  . Lactic acidosis 09/02/2018  . Chronic combined systolic and diastolic congestive heart failure ([place]) 09/02/2018  . Rapid atrial fibrillation ([place]) 06/08/2018  . Acute on chronic combined systolic and diastolic heart failure ([place]) 06/07/2018  . Anemia 06/07/2018  . Acute GI bleeding 06/15/2017  . Chronic anticoagulation   . Acute on chronic congestive heart failure ([place])   . HLD (hyperlipidemia) 03/05/2017  . CKD (chronic kidney disease), stage III ([place]) 03/05/2017  . Chest pain 03/05/2017  . Symptomatic anemia 03/05/2017  . PAF (paroxysmal atrial fibrillation) ([place]) 04/01/2013  . ICD (implantable cardioverter-defibrillator), dual, [employer] 04/01/2013  . PVC's (premature ventricular contractions) 04/01/2013  . Hypokalemia 12/21/2012  . History of tobacco abuse 12/21/2012  . Elevated troponin 12/21/2012  . Sinus bradycardia 12/21/2012  . Hyperkalemia 12/20/2012  . Nonischemic cardiomyopathy ([place]) 12/20/2012  . UTI (urinary tract infection) 12/20/2012  . Acute on chronic combined systolic and diastolic CHF (congestive heart failure) ([place]) 12/20/2012  . Ventricular tachycardia ([place]) 12/14/2012  . Hypertension 12/14/2012    Past Surgical History:  Procedure Laterality Date  . BIOPSY  01/22/2019   Procedure: BIOPSY;  Surgeon: Wilford Corner, MD;  Location: [facility];  Service: Endoscopy;;  . ESOPHAGOGASTRODUODENOSCOPY (EGD) WITH PROPOFOL N/A 06/18/2017   Procedure: ESOPHAGOGASTRODUODENOSCOPY (EGD) WITH PROPOFOL;  Surgeon: Ronnette Juniper, MD;  Location: [facility];  Service: Gastroenterology;  Laterality:  N/A;  . ESOPHAGOGASTRODUODENOSCOPY (EGD) WITH PROPOFOL N/A 01/22/2019   Procedure: ESOPHAGOGASTRODUODENOSCOPY (EGD) WITH PROPOFOL;  Surgeon: Wilford Corner, MD;  Location: [facility];  Service: Endoscopy;  Laterality: N/A;  . IMPLANTABLE CARDIOVERTER DEFIBRILLATOR IMPLANT  12/19/2012   [employer]  dual-chamber ICD, serial number [id number]  . IMPLANTABLE CARDIOVERTER DEFIBRILLATOR IMPLANT N/A 12/19/2012   Procedure: IMPLANTABLE CARDIOVERTER DEFIBRILLATOR IMPLANT;  Surgeon: Deboraha Sprang, MD;  Location: [facility] CATH LAB;  Service: Cardiovascular;  Laterality: N/A;  . LEFT AND RIGHT HEART CATHETERIZATION WITH CORONARY ANGIOGRAM N/A 12/17/2012   Procedure: LEFT AND RIGHT HEART CATHETERIZATION WITH CORONARY ANGIOGRAM;  Surgeon: Sherren Mocha, MD;  Location: [facility] CATH LAB;  Service: Cardiovascular;  Laterality: N/A;  . SCLEROTHERAPY  01/22/2019   Procedure: SCLEROTHERAPY;  Surgeon: Wilford Corner, MD;  Location: [facility] ENDOSCOPY;  Service: Endoscopy;;     OB History   No obstetric history on file.      Home Medications    Prior to Admission medications   Medication Sig Start Date End Date Taking? Authorizing Provider  amiodarone (PACERONE) 200 MG tablet Take 1 tablet (200 mg total) by mouth daily. 02/15/19  Yes [name], PA-C  apixaban (ELIQUIS) 5 MG TABS tablet Take 1 tablet (5 mg total) by mouth 2 (two) times daily. 01/12/19 03/27/19 Yes Guilford Shi, MD  atorvastatin (LIPITOR) 40 MG tablet Take 1 tablet (40 mg total) by mouth daily at 6 PM. 07/05/18  Yes Deboraha Sprang, MD  carvedilol (COREG) 3.125 MG tablet Take 1 tablet (3.125 mg total) by mouth 2 (two) times daily with a meal. 09/12/18  Yes Deboraha Sprang, MD  Cholecalciferol (VITAMIN D-3) 25 MCG (1000 UT) CAPS Take 1,000 Units by mouth at bedtime.    Yes [provider]  Ensure (ENSURE) Take 237 mLs by mouth daily.   Yes [provider]  ferrous gluconate (FERGON) 324 MG tablet Take 324 mg by mouth 2 (two) times daily.  07/05/18  Yes [provider]  furosemide (LASIX) 80 MG tablet Take 1 tablet (80 mg total) by mouth daily. 07/05/18  Yes Deboraha Sprang, MD  hydrALAZINE (APRESOLINE) 25 MG tablet Take 0.5 tablets (12.5 mg total) by mouth 3 (three) times daily. 01/12/19  Yes Guilford Shi, MD  isosorbide  mononitrate (IMDUR) 30 MG 24 hr tablet Take 0.5 tablets (15 mg total) by mouth daily. 01/13/19  Yes Guilford Shi, MD  MAGNESIUM-OXIDE 400 (241.3 Mg) MG tablet TAKE 1 TABLET BY MOUTH ONCE DAILY Patient taking differently: Take 400 mg by mouth daily.  02/07/19  Yes Deboraha Sprang, MD  metolazone (ZAROXOLYN) 2.5 MG tablet Take 1 tablet (2.5 mg total) by mouth daily as needed (leg swellings or shortness of breath or weight gain>5 pounds in 48 hours). 01/12/19  Yes Guilford Shi, MD  mexiletine (MEXITIL) 200 MG capsule Take 1 capsule (200 mg total) by mouth 2 (two) times daily. 01/24/19  Yes [name], PA-C  nitroGLYCERIN (NITROSTAT) 0.4 MG SL tablet Place 1 tablet (0.4 mg total) under the tongue every 5 (five) minutes as needed for chest pain. 01/12/19  Yes Guilford Shi, MD  pantoprazole (PROTONIX) 40 MG tablet Take 1 tablet (40 mg total) by mouth 2 (two) times daily before a meal. 06/18/17  Yes Lavina Hamman, MD  levalbuterol [facility] HFA) 45 MCG/ACT inhaler Inhale 1 puff into the lungs every 4 (four) hours as needed for wheezing. Patient not taking: Reported on 02/23/2019 01/12/19 01/12/20  Guilford Shi, MD    Family History  Family History  Problem Relation Age of Onset  . Cancer Mother     Social History Social History   Tobacco Use  . Smoking status: Former Research scientist (life sciences)  . Smokeless tobacco: Never Used  Substance Use Topics  . Alcohol use: No  . Drug use: No     Allergies   Strawberry extract   Review of Systems Review of Systems  All other systems reviewed and are negative.    Physical Exam Updated Vital Signs BP (!) 144/63   Pulse 60   Temp 97.7 F (36.5 C) (Oral)   Resp 17   SpO2 91%   Physical Exam Vitals signs and nursing note reviewed.  Constitutional:      General: She is not in acute distress.    Appearance: She is well-developed. She is not ill-appearing.  HENT:     Head: Normocephalic and atraumatic.     Mouth/Throat:     Mouth: Mucous  membranes are moist.  Eyes:     Conjunctiva/sclera: Conjunctivae normal.  Cardiovascular:     Rate and Rhythm: Normal rate and regular rhythm.  Pulmonary:     Effort: Pulmonary effort is normal. No respiratory distress.     Breath sounds: Normal breath sounds.  Abdominal:     General: Bowel sounds are normal.     Palpations: Abdomen is soft.     Tenderness: There is abdominal tenderness. There is no guarding or rebound.     Comments: Generalized tenderness, worse in the epigastrium.  Skin:    General: Skin is warm.  Neurological:     Mental Status: She is alert.  Psychiatric:        Behavior: Behavior normal.      ED Treatments / Results  Labs (all labs ordered are listed, but only abnormal results are displayed) Labs Reviewed  COMPREHENSIVE METABOLIC PANEL - Abnormal; Notable for the following components:      Result Value   Glucose, Bld 115 (*)    BUN 37 (*)    Creatinine, Ser 2.16 (*)    Total Protein 8.8 (*)    AST 104 (*)    ALT 53 (*)    GFR calc non Af Amer 22 (*)    GFR calc Af Amer 25 (*)    All other components within normal limits  LIPASE, BLOOD - Abnormal; Notable for the following components:   Lipase 54 (*)    All other components within normal limits  CBC WITH DIFFERENTIAL/PLATELET - Abnormal; Notable for the following components:   MCH 25.7 (*)    RDW 24.6 (*)    All other components within normal limits    EKG EKG Interpretation  Date/Time:  Saturday February 23 2019 16:18:42 EDT Ventricular Rate:  60 PR Interval:    QRS Duration: 158 QT Interval:  508 QTC Calculation: 508 R Axis:   -7 Text Interpretation:  Sinus rhythm Ventricular tachycardia, unsustained Prolonged PR interval Left bundle branch block probable paced rhythm similar to prior EKG Confirmed by Malvin Johns [phone]) on 02/23/2019 5:04:13 PM   Radiology US Abdomen Limited Ruq  Result Date: 02/23/2019 CLINICAL DATA:  73 year old female with acute abdominal pain. Elevated lipase.  EXAM: ULTRASOUND ABDOMEN LIMITED RIGHT UPPER QUADRANT COMPARISON:  01/28/2019 prior CTs. FINDINGS: Gallbladder: Gallbladder wall thickening is identified and appears mildly edematous. More focal thickening of the gallbladder wall towards the fundus is noted. There is no evidence of cholelithiasis. Common bile duct: Diameter: 6 mm.  No intrahepatic or extrahepatic biliary dilatation. Liver:  No focal lesion identified. Within normal limits in parenchymal echogenicity. Portal vein is patent on color Doppler imaging with normal direction of blood flow towards the liver. Other: None. IMPRESSION: 1. Nonspecific gallbladder wall thickening. No evidence of cholelithiasis. If there is strong clinical suspicion for acute cholecystitis, consider nuclear medicine study. 2. No other significant abnormalities. Electronically Signed   By: Margarette Canada M.D.   On: 02/23/2019 19:44    Procedures Procedures (including critical care time)  Medications Ordered in ED Medications  ondansetron (ZOFRAN) injection 4 mg (4 mg Intravenous Given 02/23/19 1807)  famotidine (PEPCID) IVPB 20 mg premix (0 mg Intravenous Stopped 02/23/19 1936)  fentaNYL (SUBLIMAZE) injection 25 mcg (25 mcg Intravenous Given 02/23/19 1807)     Initial Impression / Assessment and Plan / ED Course  I have reviewed the triage vital signs and the nursing notes.  Pertinent labs & imaging results that were available during my care of the patient were reviewed by me and considered in my medical decision making (see chart for details).  Clinical Course as of Feb 22 2034  Sat Feb 23, 2019  2030 Patient discussed with Dr. Laverta [place].  At this time as patient symptoms have significantly improved, will discharge with close PCP follow-up and symptomatic relief.  Strict return precautions.   [JR]    Clinical Course User Index [JR] Robinson, [place] N, PA-C      Patient with known history of gastritis, presenting with similar symptoms including epigastric  abdominal pain, nausea without vomiting.  Normal bowel movements today.  No fevers.  She is overall well-appearing and in no distress.  On arrival she is afebrile with stable vital signs.  Abdominal exam is nonfocal.  She was treated with antiemetics, pain medication, Pepcid with significant improvement in symptoms.  Her labs reveal a normal white count.  Lipase appears to be at her baseline.  CMP with slightly elevated LFTs from previous.  For this reason we will proceed with right upper quadrant ultrasound for further evaluation.  Ultrasound revealing some nonspecific gallbladder wall thickening, no cholelithiasis.  This was discussed with Dr. Laverta [place].  As patient is afebrile, no leukocytosis, and no focal right upper quadrant tenderness, recommend discharge with close PCP follow-up and strict return precautions.  Discussed this with patient, she verbalized understanding agrees with care plan for discharge.  Patient discussed with Dr. Tamera Punt as well as Dr. Laverta [place].  Discussed results, findings, treatment and follow up. Patient advised of return precautions. Patient verbalized understanding and agreed with plan.   Final Clinical Impressions(s) / ED Diagnoses   Final diagnoses:  Epigastric abdominal pain    ED Discharge Orders    None       Robinson, [place] N, PA-C 02/23/19 2109    Malvin Johns, MD 02/24/19 [phone]

## 2019-02-23 NOTE — Discharge Instructions (Signed)
Continue taking your pantoprazole as prescribed. Drink clear fluids until your stomach feels better. Follow close with your primary care provider regarding your visit today. Return to the emergency department if you develop fever, severely worsening abdominal pain, uncontrollable vomiting, or new concerning symptoms.

## 2019-02-23 NOTE — ED Triage Notes (Signed)
PT from home and called EMS for ABD pain that started this AM. Pt reported having a hard stool this AM and ABD pain after . Pt reports the the ABD pain continued  Today. Pt ABD tender to touch .

## 2019-02-23 NOTE — ED Notes (Signed)
Patient transported to Ultrasound 

## 2019-02-23 NOTE — ED Notes (Signed)
Pt verbalized a clear understanding of discharge, prescription, and treatment. No further questions from patient, discharging with daughter.

## 2019-02-27 ENCOUNTER — Emergency Department (HOSPITAL_COMMUNITY)
Admission: EM | Admit: 2019-02-27 | Discharge: 2019-02-28 | Disposition: A | Discharge status: Home / Self Care | Payer: Medicare Other | Attending: Emergency Medicine | Admitting: Emergency Medicine

## 2019-02-27 ENCOUNTER — Encounter (HOSPITAL_COMMUNITY): Payer: Self-pay

## 2019-02-27 ENCOUNTER — Other Ambulatory Visit: Payer: Self-pay

## 2019-02-27 DIAGNOSIS — I130 Hypertensive heart and chronic kidney disease with heart failure and stage 1 through stage 4 chronic kidney disease, or unspecified chronic kidney disease: Secondary | ICD-10-CM | POA: Insufficient documentation

## 2019-02-27 DIAGNOSIS — I5022 Chronic systolic (congestive) heart failure: Secondary | ICD-10-CM | POA: Insufficient documentation

## 2019-02-27 DIAGNOSIS — Z87891 Personal history of nicotine dependence: Secondary | ICD-10-CM | POA: Insufficient documentation

## 2019-02-27 DIAGNOSIS — K5900 Constipation, unspecified: Secondary | ICD-10-CM | POA: Insufficient documentation

## 2019-02-27 DIAGNOSIS — I480 Paroxysmal atrial fibrillation: Secondary | ICD-10-CM | POA: Diagnosis not present

## 2019-02-27 DIAGNOSIS — Z79899 Other long term (current) drug therapy: Secondary | ICD-10-CM | POA: Insufficient documentation

## 2019-02-27 DIAGNOSIS — R110 Nausea: Secondary | ICD-10-CM | POA: Insufficient documentation

## 2019-02-27 DIAGNOSIS — N184 Chronic kidney disease, stage 4 (severe): Secondary | ICD-10-CM | POA: Insufficient documentation

## 2019-02-27 DIAGNOSIS — Z7901 Long term (current) use of anticoagulants: Secondary | ICD-10-CM | POA: Diagnosis not present

## 2019-02-27 DIAGNOSIS — R1010 Upper abdominal pain, unspecified: Secondary | ICD-10-CM | POA: Diagnosis present

## 2019-02-27 DIAGNOSIS — R109 Unspecified abdominal pain: Secondary | ICD-10-CM

## 2019-02-27 LAB — URINALYSIS, ROUTINE W REFLEX MICROSCOPIC
Bilirubin Urine: NEGATIVE
Glucose, UA: NEGATIVE mg/dL
Ketones, ur: NEGATIVE mg/dL
Leukocytes,Ua: NEGATIVE
Nitrite: NEGATIVE
Protein, ur: NEGATIVE mg/dL
RBC / HPF: >50 RBC/hpf — ABNORMAL HIGH (ref 0–5)
Specific Gravity, Urine: 1.009 (ref 1.005–1.030)
pH: 6 (ref 5.0–8.0)

## 2019-02-27 LAB — CBC
HCT: 37.4 % (ref 36.0–46.0)
Hemoglobin: 12.3 g/dL (ref 12.0–15.0)
MCH: 26.8 pg (ref 26.0–34.0)
MCHC: 32.9 g/dL (ref 30.0–36.0)
MCV: 81.5 fL (ref 80.0–100.0)
Platelets: 234 10*3/uL (ref 150–400)
RBC: 4.59 MIL/uL (ref 3.87–5.11)
RDW: 24.1 % — ABNORMAL HIGH (ref 11.5–15.5)
WBC: 6 10*3/uL (ref 4.0–10.5)
nRBC: 0 % (ref 0.0–0.2)

## 2019-02-27 LAB — COMPREHENSIVE METABOLIC PANEL
ALT: 51 U/L — ABNORMAL HIGH (ref 0–44)
AST: 96 U/L — ABNORMAL HIGH (ref 15–41)
Albumin: 3.5 g/dL (ref 3.5–5.0)
Alkaline Phosphatase: 90 U/L (ref 38–126)
Anion gap: 14 (ref 5–15)
BUN: 46 mg/dL — ABNORMAL HIGH (ref 8–23)
CO2: 24 mmol/L (ref 22–32)
Calcium: 9.2 mg/dL (ref 8.9–10.3)
Chloride: 99 mmol/L (ref 98–111)
Creatinine, Ser: 2.41 mg/dL — ABNORMAL HIGH (ref 0.44–1.00)
GFR calc Af Amer: 22 mL/min — ABNORMAL LOW (ref >60)
GFR calc non Af Amer: 19 mL/min — ABNORMAL LOW (ref >60)
Glucose, Bld: 129 mg/dL — ABNORMAL HIGH (ref 70–99)
Potassium: 3.7 mmol/L (ref 3.5–5.1)
Sodium: 137 mmol/L (ref 135–145)
Total Bilirubin: 0.9 mg/dL (ref 0.3–1.2)
Total Protein: 8.4 g/dL — ABNORMAL HIGH (ref 6.5–8.1)

## 2019-02-27 LAB — LIPASE, BLOOD: Lipase: 46 U/L (ref 11–51)

## 2019-02-27 MED ORDER — SODIUM CHLORIDE 0.9% FLUSH
3.0000 mL | Freq: Once | INTRAVENOUS | Status: AC
  Administered 2019-02-28: 07:00:00 3 mL via INTRAVENOUS

## 2019-02-27 NOTE — ED Triage Notes (Signed)
Last BM 2 days ago.  BM not normal and was loose.  Onset yesterday abd pain.

## 2019-02-28 ENCOUNTER — Emergency Department (HOSPITAL_COMMUNITY): Payer: Medicare Other

## 2019-02-28 LAB — CBC
HCT: 35.6 % — ABNORMAL LOW (ref 36.0–46.0)
Hemoglobin: 11.4 g/dL — ABNORMAL LOW (ref 12.0–15.0)
MCH: 26.2 pg (ref 26.0–34.0)
MCHC: 32 g/dL (ref 30.0–36.0)
MCV: 81.8 fL (ref 80.0–100.0)
Platelets: 215 10*3/uL (ref 150–400)
RBC: 4.35 MIL/uL (ref 3.87–5.11)
RDW: 24.1 % — ABNORMAL HIGH (ref 11.5–15.5)
WBC: 8.4 10*3/uL (ref 4.0–10.5)
nRBC: 0 % (ref 0.0–0.2)

## 2019-02-28 LAB — POC OCCULT BLOOD, ED: Fecal Occult Bld: POSITIVE — AB

## 2019-02-28 MED ORDER — PANTOPRAZOLE SODIUM 40 MG IV SOLR
40.0000 mg | Freq: Once | INTRAVENOUS | Status: AC
  Administered 2019-02-28: 07:00:00 40 mg via INTRAVENOUS
  Filled 2019-02-28: qty 40

## 2019-02-28 MED ORDER — SODIUM CHLORIDE 0.9 % IV BOLUS
250.0000 mL | Freq: Once | INTRAVENOUS | Status: AC
  Administered 2019-02-28: 07:00:00 250 mL via INTRAVENOUS

## 2019-02-28 NOTE — ED Provider Notes (Signed)
[facility] EMERGENCY DEPARTMENT Provider Note   CSN: [id number] Arrival date & time: 02/27/19  2005     History   Chief Complaint Chief Complaint  Patient presents with   Abdominal Pain   Constipation    HPI Stephanie Yates is a 73 y.o. female.    73 y.o. female with past medical history of HTN, PAF (on chronic Eliquis), NICM with EF 15% s/p ICD, CKD, chronic gastritis, hypertension presents to the emergency department for evaluation of constipation.  She states that she has not had a bowel movement since Saturday or Sunday.  She also reports passing little to no flatus.  Abdominal pain is fairly chronic for the patient, it has been mostly in her upper abdomen.  She does have some associated nausea, but voiding relieves this.  No medications taken PTA for symptoms.  No vomiting, fevers, dysuria.  The history is provided by the patient. No language interpreter was used.  Abdominal Pain Associated symptoms: constipation   Constipation Associated symptoms: abdominal pain     Past Medical History:  Diagnosis Date   Chronic anemia    Chronic systolic CHF (congestive heart failure) (HCC)    CKD (chronic kidney disease), stage IV (HCC)    Former tobacco use    History of tobacco abuse    Hyperlipidemia    Hypertension    Mitral regurgitation    Nonischemic cardiomyopathy ([place])    a. EF 10-15% on 11/2012 cath with no significant CAD. b. EF 25% in 2019. b. EF 15% in 12/2018   PAF (paroxysmal atrial fibrillation) (HCC)    Protein calorie malnutrition ([place])    Quit consuming alcohol in remote past    Sinus bradycardia 12/21/2012   Ventricular tachycardia ([place]) 12/14/2012    Patient Active Problem List   Diagnosis Date Noted   Epigastric abdominal pain 01/29/2019   Gastritis 01/28/2019   CKD (chronic kidney disease) stage 4, GFR 15-29 ml/min (HCC) 01/28/2019   VT (ventricular tachycardia) ([place]) 01/17/2019   AKI (acute kidney injury) ([place])  01/12/2019   Palliative care encounter    CHF exacerbation ([place]) 01/06/2019   Influenza A 09/02/2018   Lactic acidosis 09/02/2018   Chronic combined systolic and diastolic congestive heart failure ([place]) 09/02/2018   Rapid atrial fibrillation ([place]) 06/08/2018   Acute on chronic combined systolic and diastolic heart failure ([place]) 06/07/2018   Anemia 06/07/2018   Acute GI bleeding 06/15/2017   Chronic anticoagulation    Acute on chronic congestive heart failure ([place])    HLD (hyperlipidemia) 03/05/2017   CKD (chronic kidney disease), stage III ([place]) 03/05/2017   Chest pain 03/05/2017   Symptomatic anemia 03/05/2017   PAF (paroxysmal atrial fibrillation) ([place]) 04/01/2013   ICD (implantable cardioverter-defibrillator), dual, st Judes 04/01/2013   PVC's (premature ventricular contractions) 04/01/2013   Hypokalemia 12/21/2012   History of tobacco abuse 12/21/2012   Elevated troponin 12/21/2012   Sinus bradycardia 12/21/2012   Hyperkalemia 12/20/2012   Nonischemic cardiomyopathy ([place]) 12/20/2012   UTI (urinary tract infection) 12/20/2012   Acute on chronic combined systolic and diastolic CHF (congestive heart failure) ([place]) 12/20/2012   Ventricular tachycardia ([place]) 12/14/2012   Hypertension 12/14/2012    Past Surgical History:  Procedure Laterality Date   BIOPSY  01/22/2019   Procedure: BIOPSY;  Surgeon: Wilford Corner, MD;  Location: [facility];  Service: Endoscopy;;   ESOPHAGOGASTRODUODENOSCOPY (EGD) WITH PROPOFOL N/A 06/18/2017   Procedure: ESOPHAGOGASTRODUODENOSCOPY (EGD) WITH PROPOFOL;  Surgeon: Ronnette Juniper, MD;  Location: [facility];  Service: Gastroenterology;  Laterality: N/A;   ESOPHAGOGASTRODUODENOSCOPY (EGD) WITH PROPOFOL N/A 01/22/2019   Procedure: ESOPHAGOGASTRODUODENOSCOPY (EGD) WITH PROPOFOL;  Surgeon: Wilford Corner, MD;  Location: [facility];  Service: Endoscopy;  Laterality: N/A;   IMPLANTABLE CARDIOVERTER DEFIBRILLATOR  IMPLANT  12/19/2012   St. Jude dual-chamber ICD, serial number [id number]   IMPLANTABLE CARDIOVERTER DEFIBRILLATOR IMPLANT N/A 12/19/2012   Procedure: IMPLANTABLE CARDIOVERTER DEFIBRILLATOR IMPLANT;  Surgeon: Deboraha Sprang, MD;  Location: [facility] CATH LAB;  Service: Cardiovascular;  Laterality: N/A;   LEFT AND RIGHT HEART CATHETERIZATION WITH CORONARY ANGIOGRAM N/A 12/17/2012   Procedure: LEFT AND RIGHT HEART CATHETERIZATION WITH CORONARY ANGIOGRAM;  Surgeon: Sherren Mocha, MD;  Location: [facility] CATH LAB;  Service: Cardiovascular;  Laterality: N/A;   SCLEROTHERAPY  01/22/2019   Procedure: SCLEROTHERAPY;  Surgeon: Wilford Corner, MD;  Location[facility] ENDOSCOPY;  Service: Endoscopy;;     OB History   No obstetric history on file.      Home Medications    Prior to Admission medications   Medication Sig Start Date End Date Taking? Authorizing Provider  amiodarone (PACERONE) 200 MG tablet Take 1 tablet (200 mg total) by mouth daily. 02/15/19   [name], PA-C  apixaban (ELIQUIS) 5 MG TABS tablet Take 1 tablet (5 mg total) by mouth 2 (two) times daily. 01/12/19 03/27/19  Gu[place]ford Shi, MD  atorvastatin (LIPITOR) 40 MG tablet Take 1 tablet (40 mg total) by mouth daily at 6 PM. 07/05/18   Deboraha Sprang, MD  carvedilol (COREG) 3.125 MG tablet Take 1 tablet (3.125 mg total) by mouth 2 (two) times daily with a meal. 09/12/18   Deboraha Sprang, MD  Cholecalciferol (VITAMIN D-3) 25 MCG (1000 UT) CAPS Take 1,000 Units by mouth at bedtime.     [provider]  Ensure (ENSURE) Take 237 mLs by mouth daily.    [provider]  ferrous gluconate (FERGON) 324 MG tablet Take 324 mg by mouth 2 (two) times daily.  07/05/18   [provider]  furosemide (LASIX) 80 MG tablet Take 1 tablet (80 mg total) by mouth daily. 07/05/18   Deboraha Sprang, MD  hydrALAZINE (APRESOLINE) 25 MG tablet Take 0.5 tablets (12.5 mg total) by mouth 3 (three) times daily. 01/12/19   Guilford Shi, MD    isosorbide mononitrate (IMDUR) 30 MG 24 hr tablet Take 0.5 tablets (15 mg total) by mouth daily. 01/13/19   Guilford Shi, MD  levalbuterol (XOPENEX HFA) 45 MCG/ACT inhaler Inhale 1 puff into the lungs every 4 (four) hours as needed for wheezing. Patient not taking: Reported on 02/23/2019 01/12/19 01/12/20  Guilford Shi, MD  MAGNESIUM-OXIDE 400 (241.3 Mg) MG tablet TAKE 1 TABLET BY MOUTH ONCE DAILY Patient taking differently: Take 400 mg by mouth daily.  02/07/19   Deboraha Sprang, MD  metolazone (ZAROXOLYN) 2.5 MG tablet Take 1 tablet (2.5 mg total) by mouth daily as needed (leg swellings or shortness of breath or weight gain>5 pounds in 48 hours). 01/12/19   Guilford Shi, MD  mexiletine (MEXITIL) 200 MG capsule Take 1 capsule (200 mg total) by mouth 2 (two) times daily. 01/24/19   [name], PA-C  nitroGLYCERIN (NITROSTAT) 0.4 MG SL tablet Place 1 tablet (0.4 mg total) under the tongue every 5 (five) minutes as needed for chest pain. 01/12/19   Guilford Shi, MD  ondansetron (ZOFRAN ODT) 4 MG disintegrating tablet Take 1 tablet (4 mg total) by mouth every 8 (eight) hours as needed for nausea or vomiting. 02/23/19  Robinson, [place] N, PA-C  pantoprazole (PROTONIX) 40 MG tablet Take 1 tablet (40 mg total) by mouth 2 (two) times daily before a meal. 06/18/17   Lavina Hamman, MD    Family History Family History  Problem Relation Age of Onset   Cancer Mother     Social History Social History   Tobacco Use   Smoking status: Former Smoker   Smokeless tobacco: Never Used  Substance Use Topics   Alcohol use: No   Drug use: No     Allergies   Strawberry extract   Review of Systems Review of Systems  Gastrointestinal: Positive for abdominal pain and constipation.  Ten systems reviewed and are negative for acute change, except as noted in the HPI.    Physical Exam Updated Vital Signs BP 133/70 (BP Location: Left Arm)    Pulse 61    Temp 97.6 F (36.4 C)  (Oral)    Resp 16    SpO2 95%   Physical Exam Vitals signs and nursing note reviewed.  Constitutional:      General: She is not in acute distress.    Appearance: She is well-developed. She is not diaphoretic.     Comments: Nontoxic appearing, in NAD  HENT:     Head: Normocephalic and atraumatic.  Eyes:     General: No scleral icterus.    Conjunctiva/sclera: Conjunctivae normal.  Neck:     Musculoskeletal: Normal range of motion.  Cardiovascular:     Rate and Rhythm: Normal rate and regular rhythm.     Pulses: Normal pulses.  Pulmonary:     Effort: Pulmonary effort is normal. No respiratory distress.     Comments: Respirations even and unlabored Abdominal:     Palpations: Abdomen is soft. There is no mass.     Tenderness: There is no guarding.     Comments: No focal abdominal tenderness.  She is subjectively more uncomfortable with palpation in the upper abdomen.  No peritoneal signs or palpable masses.  Genitourinary:    Comments: Soft, melanotic stool on digital rectal exam.  No fecal impaction.  Normal rectal tone. Musculoskeletal: Normal range of motion.  Skin:    General: Skin is warm and dry.     Coloration: Skin is not pale.     Findings: No erythema or rash.  Neurological:     Mental Status: She is alert and oriented to person, place, and time.  Psychiatric:        Behavior: Behavior normal.      ED Treatments / Results  Labs (all labs ordered are listed, but only abnormal results are displayed) Labs Reviewed  COMPREHENSIVE METABOLIC PANEL - Abnormal; Notable for the following components:      Result Value   Glucose, Bld 129 (*)    BUN 46 (*)    Creatinine, Ser 2.41 (*)    Total Protein 8.4 (*)    AST 96 (*)    ALT 51 (*)    GFR calc non Af Amer 19 (*)    GFR calc Af Amer 22 (*)    All other components within normal limits  CBC - Abnormal; Notable for the following components:   RDW 24.1 (*)    All other components within normal limits  URINALYSIS,  ROUTINE W REFLEX MICROSCOPIC - Abnormal; Notable for the following components:   Hgb urine dipstick LARGE (*)    RBC / HPF >50 (*)    Bacteria, UA RARE (*)    All other components  within normal limits  POC OCCULT BLOOD, ED - Abnormal; Notable for the following components:   Fecal Occult Bld POSITIVE (*)    All other components within normal limits  LIPASE, BLOOD  CBC    EKG EKG Interpretation  Date/Time:  Wednesday February 27 2019 20:28:39 EDT Ventricular Rate:  60 PR Interval:  256 QRS Duration: 148 QT Interval:  512 QTC Calculation: 512 R Axis:   -26 Text Interpretation:  Atrial-paced rhythm with prolonged AV conduction Non-specific intra-ventricular conduction block Abnormal ECG No acute changes No significant change since last tracing Confirmed by Varney Biles ([id number]) on 02/28/2019 6:20:21 AM   Radiology Dg Abd 2 Views  Result Date: 02/28/2019 CLINICAL DATA:  Constipation EXAM: ABDOMEN - 2 VIEW COMPARISON:  CT abdomen pelvis January 28, 2019 FINDINGS: Nonobstructive bowel gas pattern. Moderate volume of stool throughout the colon. Air and stool overlies the rectal vault. No suspicious calcifications over the gallbladder fossa or renal shadows though limited by overlying ingested material. The heart is enlarged with a 2 lead pacer/defibrillator device, battery pack within the soft tissues of the left chest wall. Lung bases are clear. IMPRESSION: Moderate volume of stool throughout the colon. No high-grade obstructive pattern. Electronically Signed   By: Lovena Le M.D.   On: 02/28/2019 06:21    Procedures Procedures (including critical care time)  Medications Ordered in ED Medications  sodium chloride 0.9 % bolus 250 mL (has no administration in time range)  sodium chloride flush (NS) 0.9 % injection 3 mL (3 mLs Intravenous Given 02/28/19 0638)  pantoprazole (PROTONIX) injection 40 mg (40 mg Intravenous Given 02/28/19 0644)     Initial Impression / Assessment and Plan / ED  Course  I have reviewed the triage vital signs and the nursing notes.  Pertinent labs & imaging results that were available during my care of the patient were reviewed by me and considered in my medical decision making (see chart for details).        6:02 AM Patient with history of chronic gastritis presenting for complaints of constipation.  She has no evidence of fecal impaction and soft stool palpated at the tip of my finger.  Her stool does appear melanotic.  Will send for occult testing.  Hemoglobin, however, has been fairly stable compared to July.  Her kidney function is also slightly worse which may be due to dehydration.  Upper GI bleed could also contribute to elevation in BUN today.  Patient with complaints of associated abdominal pain.  She has had multiple abdominal imaging in the past including 2 CTs in the past month and a right upper quadrant ultrasound a few days ago.  X-ray ordered to assess for constipation, free air.  Patient resting comfortably at this time.  6:49 AM Patient noted to have moderate volume of stool through the colon.  Given abdominal discomfort and complaints of constipation, will give soapsuds enema to promote bowel movement.  While the patient is Hemoccult positive today, she is also on iron which could be contributing to her stool color.  Her hemoglobin was reassuring when last checked at 2000 and last night.  Ordered repeat CBC.  If this is stable after 11 hours, I do not necessarily feel that observation admission is warranted for serial CBCs.  Patient signed out to [place], PA-C at shift change.   Final Clinical Impressions(s) / ED Diagnoses   Final diagnoses:  Constipation  Abdominal pain    ED Discharge Orders    None  Antonietta Breach, PA-C 02/28/19 [place], [place], MD 02/28/19 [phone]

## 2019-02-28 NOTE — Discharge Instructions (Addendum)
You have been seen today for constipation and abdominal pain. Please read and follow all provided instructions. Return to the emergency room for worsening condition or new concerning symptoms.    1. Medications:  DO NOT take your Eliquis today or tomorrow. Start taking it again on Saturday. Continue other home medications  2. Treatment: rest, drink plenty of fluids.  3. Follow Up: Please follow up with your stomach doctor at [facility]. Call the office today to schedule follow up within the next 2-5 days.  It is also a possibility that you have an allergic reaction to any of the medicines that you have been prescribed - Everybody reacts differently to medications and while MOST people have no trouble with most medicines, you may have a reaction such as nausea, vomiting, rash, swelling, shortness of breath. If this is the case, please stop taking the medicine immediately and contact your physician.  ?

## 2019-02-28 NOTE — ED Provider Notes (Signed)
Received patient in signout.  Currently waiting for repeat CBC.  If hemoglobin is stable will plan to discharge home.  In short, 73 year old female on Eliquis presents with abdominal pain and constipation. She has not had a bowel movement in 5 or 6 days.  Work-up today shows hemoccult positive.  Hemoglobin stable at 12.3.  Previous provider performed a rectal exam to check for stool impaction, stool was melanotic  Patient is currently taking iron supplements.  Abdominal x-ray shows moderate stool burden throughout colon. Presentation was not felt to be consistent with upper GI bleed.  Physical Exam  BP 133/70 (BP Location: Left Arm)   Pulse 61   Temp 97.6 F (36.4 C) (Oral)   Resp 16   SpO2 95%   Physical Exam PE: Constitutional: well-developed, well-nourished, no apparent distress HENT: normocephalic, atraumatic. no cervical adenopathy Cardiovascular: normal rate and rhythm, distal pulses intact Pulmonary/Chest: effort normal; breath sounds clear and equal bilaterally; no wheezes or rales Abdominal: soft. No focal tenderness. Musculoskeletal: full ROM, no edema Neurological: alert with goal directed thinking Skin: warm and dry, no rash, no diaphoresis Psychiatric: normal mood and affect, normal behavior    ED Course/Procedures   Results for orders placed or performed during the hospital encounter of 02/27/19 (from the past 24 hour(s))  Lipase, blood     Status: None   Collection Time: 02/27/19  8:21 PM  Result Value Ref Range   Lipase 46 11 - 51 U/L  Comprehensive metabolic panel     Status: Abnormal   Collection Time: 02/27/19  8:21 PM  Result Value Ref Range   Sodium 137 135 - 145 mmol/L   Potassium 3.7 3.5 - 5.1 mmol/L   Chloride 99 98 - 111 mmol/L   CO2 24 22 - 32 mmol/L   Glucose, Bld 129 (H) 70 - 99 mg/dL   BUN 46 (H) 8 - 23 mg/dL   Creatinine, Ser 2.41 (H) 0.44 - 1.00 mg/dL   Calcium 9.2 8.9 - 10.3 mg/dL   Total Protein 8.4 (H) 6.5 - 8.1 g/dL   Albumin 3.5 3.5 -  5.0 g/dL   AST 96 (H) 15 - 41 U/L   ALT 51 (H) 0 - 44 U/L   Alkaline Phosphatase 90 38 - 126 U/L   Total Bilirubin 0.9 0.3 - 1.2 mg/dL   GFR calc non Af Amer 19 (L) >60 mL/min   GFR calc Af Amer 22 (L) >60 mL/min   Anion gap 14 5 - 15  CBC     Status: Abnormal   Collection Time: 02/27/19  8:21 PM  Result Value Ref Range   WBC 6.0 4.0 - 10.5 K/uL   RBC 4.59 3.87 - 5.11 MIL/uL   Hemoglobin 12.3 12.0 - 15.0 g/dL   HCT 37.4 36.0 - 46.0 %   MCV 81.5 80.0 - 100.0 fL   MCH 26.8 26.0 - 34.0 pg   MCHC 32.9 30.0 - 36.0 g/dL   RDW 24.1 (H) 11.5 - 15.5 %   Platelets 234 150 - 400 K/uL   nRBC 0.0 0.0 - 0.2 %  Urinalysis, Routine w reflex microscopic     Status: Abnormal   Collection Time: 02/27/19  8:30 PM  Result Value Ref Range   Color, Urine YELLOW YELLOW   APPearance CLEAR CLEAR   Specific Gravity, Urine 1.009 1.005 - 1.030   pH 6.0 5.0 - 8.0   Glucose, UA NEGATIVE NEGATIVE mg/dL   Hgb urine dipstick LARGE (A) NEGATIVE   Bilirubin Urine  NEGATIVE NEGATIVE   Ketones, ur NEGATIVE NEGATIVE mg/dL   Protein, ur NEGATIVE NEGATIVE mg/dL   Nitrite NEGATIVE NEGATIVE   Leukocytes,Ua NEGATIVE NEGATIVE   RBC / HPF >50 (H) 0 - 5 RBC/hpf   WBC, UA 21-50 0 - 5 WBC/hpf   Bacteria, UA RARE (A) NONE SEEN   Squamous Epithelial / LPF 0-5 0 - 5   Hyaline Casts, UA PRESENT   POC occult blood, ED     Status: Abnormal   Collection Time: 02/28/19  6:27 AM  Result Value Ref Range   Fecal Occult Bld POSITIVE (A) NEGATIVE   ABDOMEN - 2 VIEW    COMPARISON: CT abdomen pelvis January 28, 2019    FINDINGS:  Nonobstructive bowel gas pattern. Moderate volume of stool  throughout the colon. Air and stool overlies the rectal vault. No  suspicious calcifications over the gallbladder fossa or renal  shadows though limited by overlying ingested material. The heart is  enlarged with a 2 lead pacer/defibrillator device, battery pack  within the soft tissues of the left chest wall. Lung bases are  clear.     IMPRESSION:  Moderate volume of stool throughout the colon.    No high-grade obstructive pattern.      Electronically Signed  By: Lovena Le M.D.  On: 02/28/2019 06:21     MDM   Pt seen and examined. Work up today is significant for slightly worse kidney function though to be related to dehydration given decreased PO intake over last several days. Hemoccult is positive and pt had melanotic stool per previous provider rectal exam. She is currently on iron supplements.  Presentation is not overly suggestive of upper GI bleed. Repeat hemoglobin is 11.4, compared to 12.3 on arrival approximately 8 hours ago. She received 250mL IV fluids while in ED. After enema pt had large bowel movement and reports abdominal pain has resolved. Serial abdominal exams are benign, no peritoneal signs. Will have pt hold Eliquis x 2 days and follow up with [facility]. Also encouraged increase PO intake. The patient appears reasonably screened and/or stabilized for discharge and I doubt any other medical condition or other [facility] requiring further screening, evaluation, or treatment in the ED at this time prior to discharge. The patient is safe for discharge with strict return precautions discussed. Findings and plan of care discussed with supervising physician Dr. Darl Householder.  This note was prepared using Dragon voice recognition software and may include unintentional dictation errors due to the inherent limitations of voice recognition software.     Flint Melter 02/28/19 [id number]    Drenda Freeze, MD 02/28/19 [phone]

## 2019-02-28 NOTE — ED Notes (Signed)
Bed pan filled and changed out for pt.

## 2019-03-16 ENCOUNTER — Encounter (HOSPITAL_COMMUNITY): Payer: Self-pay | Admitting: *Deleted

## 2019-03-16 ENCOUNTER — Inpatient Hospital Stay (HOSPITAL_COMMUNITY)
Admission: EM | Admit: 2019-03-16 | Discharge: 2019-03-21 | DRG: 392 | Disposition: A | Discharge status: Home Health | Payer: Medicare Other | Attending: Internal Medicine | Admitting: Internal Medicine

## 2019-03-16 ENCOUNTER — Other Ambulatory Visit: Payer: Self-pay

## 2019-03-16 DIAGNOSIS — I480 Paroxysmal atrial fibrillation: Secondary | ICD-10-CM | POA: Diagnosis present

## 2019-03-16 DIAGNOSIS — K210 Gastro-esophageal reflux disease with esophagitis: Principal | ICD-10-CM | POA: Diagnosis present

## 2019-03-16 DIAGNOSIS — E278 Other specified disorders of adrenal gland: Secondary | ICD-10-CM

## 2019-03-16 DIAGNOSIS — Z79899 Other long term (current) drug therapy: Secondary | ICD-10-CM

## 2019-03-16 DIAGNOSIS — R109 Unspecified abdominal pain: Secondary | ICD-10-CM

## 2019-03-16 DIAGNOSIS — K7460 Unspecified cirrhosis of liver: Secondary | ICD-10-CM | POA: Diagnosis present

## 2019-03-16 DIAGNOSIS — K766 Portal hypertension: Secondary | ICD-10-CM | POA: Diagnosis present

## 2019-03-16 DIAGNOSIS — K829 Disease of gallbladder, unspecified: Secondary | ICD-10-CM | POA: Diagnosis present

## 2019-03-16 DIAGNOSIS — N179 Acute kidney failure, unspecified: Secondary | ICD-10-CM | POA: Diagnosis present

## 2019-03-16 DIAGNOSIS — R9431 Abnormal electrocardiogram [ECG] [EKG]: Secondary | ICD-10-CM | POA: Diagnosis present

## 2019-03-16 DIAGNOSIS — K5900 Constipation, unspecified: Secondary | ICD-10-CM | POA: Diagnosis present

## 2019-03-16 DIAGNOSIS — Z91018 Allergy to other foods: Secondary | ICD-10-CM

## 2019-03-16 DIAGNOSIS — G8929 Other chronic pain: Secondary | ICD-10-CM | POA: Diagnosis present

## 2019-03-16 DIAGNOSIS — D122 Benign neoplasm of ascending colon: Secondary | ICD-10-CM | POA: Diagnosis present

## 2019-03-16 DIAGNOSIS — E279 Disorder of adrenal gland, unspecified: Secondary | ICD-10-CM | POA: Diagnosis present

## 2019-03-16 DIAGNOSIS — K621 Rectal polyp: Secondary | ICD-10-CM | POA: Diagnosis present

## 2019-03-16 DIAGNOSIS — R1013 Epigastric pain: Secondary | ICD-10-CM | POA: Diagnosis not present

## 2019-03-16 DIAGNOSIS — E785 Hyperlipidemia, unspecified: Secondary | ICD-10-CM | POA: Diagnosis present

## 2019-03-16 DIAGNOSIS — I340 Nonrheumatic mitral (valve) insufficiency: Secondary | ICD-10-CM | POA: Diagnosis present

## 2019-03-16 DIAGNOSIS — R112 Nausea with vomiting, unspecified: Secondary | ICD-10-CM

## 2019-03-16 DIAGNOSIS — R7401 Elevation of levels of liver transaminase levels: Secondary | ICD-10-CM | POA: Diagnosis present

## 2019-03-16 DIAGNOSIS — I428 Other cardiomyopathies: Secondary | ICD-10-CM | POA: Diagnosis present

## 2019-03-16 DIAGNOSIS — K317 Polyp of stomach and duodenum: Secondary | ICD-10-CM | POA: Diagnosis present

## 2019-03-16 DIAGNOSIS — E860 Dehydration: Secondary | ICD-10-CM

## 2019-03-16 DIAGNOSIS — D631 Anemia in chronic kidney disease: Secondary | ICD-10-CM | POA: Diagnosis present

## 2019-03-16 DIAGNOSIS — N184 Chronic kidney disease, stage 4 (severe): Secondary | ICD-10-CM | POA: Diagnosis present

## 2019-03-16 DIAGNOSIS — Z87891 Personal history of nicotine dependence: Secondary | ICD-10-CM

## 2019-03-16 DIAGNOSIS — Z7901 Long term (current) use of anticoagulants: Secondary | ICD-10-CM

## 2019-03-16 DIAGNOSIS — N200 Calculus of kidney: Secondary | ICD-10-CM | POA: Diagnosis present

## 2019-03-16 DIAGNOSIS — Z20828 Contact with and (suspected) exposure to other viral communicable diseases: Secondary | ICD-10-CM | POA: Diagnosis present

## 2019-03-16 DIAGNOSIS — E876 Hypokalemia: Secondary | ICD-10-CM | POA: Diagnosis present

## 2019-03-16 DIAGNOSIS — I130 Hypertensive heart and chronic kidney disease with heart failure and stage 1 through stage 4 chronic kidney disease, or unspecified chronic kidney disease: Secondary | ICD-10-CM | POA: Diagnosis present

## 2019-03-16 DIAGNOSIS — R945 Abnormal results of liver function studies: Secondary | ICD-10-CM | POA: Diagnosis present

## 2019-03-16 DIAGNOSIS — I472 Ventricular tachycardia: Secondary | ICD-10-CM | POA: Diagnosis present

## 2019-03-16 DIAGNOSIS — I5022 Chronic systolic (congestive) heart failure: Secondary | ICD-10-CM | POA: Diagnosis present

## 2019-03-16 DIAGNOSIS — D123 Benign neoplasm of transverse colon: Secondary | ICD-10-CM | POA: Diagnosis present

## 2019-03-16 DIAGNOSIS — D124 Benign neoplasm of descending colon: Secondary | ICD-10-CM | POA: Diagnosis present

## 2019-03-16 DIAGNOSIS — Z95810 Presence of automatic (implantable) cardiac defibrillator: Secondary | ICD-10-CM

## 2019-03-16 DIAGNOSIS — K5730 Diverticulosis of large intestine without perforation or abscess without bleeding: Secondary | ICD-10-CM | POA: Diagnosis present

## 2019-03-16 LAB — CBC
HCT: 39.9 % (ref 36.0–46.0)
Hemoglobin: 12.8 g/dL (ref 12.0–15.0)
MCH: 26.7 pg (ref 26.0–34.0)
MCHC: 32.1 g/dL (ref 30.0–36.0)
MCV: 83.3 fL (ref 80.0–100.0)
Platelets: 276 10*3/uL (ref 150–400)
RBC: 4.79 MIL/uL (ref 3.87–5.11)
RDW: 22 % — ABNORMAL HIGH (ref 11.5–15.5)
WBC: 6.2 10*3/uL (ref 4.0–10.5)
nRBC: 0 % (ref 0.0–0.2)

## 2019-03-16 LAB — COMPREHENSIVE METABOLIC PANEL
ALT: 75 U/L — ABNORMAL HIGH (ref 0–44)
AST: 147 U/L — ABNORMAL HIGH (ref 15–41)
Albumin: 3.5 g/dL (ref 3.5–5.0)
Alkaline Phosphatase: 81 U/L (ref 38–126)
Anion gap: 11 (ref 5–15)
BUN: 60 mg/dL — ABNORMAL HIGH (ref 8–23)
CO2: 28 mmol/L (ref 22–32)
Calcium: 9.2 mg/dL (ref 8.9–10.3)
Chloride: 97 mmol/L — ABNORMAL LOW (ref 98–111)
Creatinine, Ser: 2.94 mg/dL — ABNORMAL HIGH (ref 0.44–1.00)
GFR calc Af Amer: 17 mL/min — ABNORMAL LOW (ref >60)
GFR calc non Af Amer: 15 mL/min — ABNORMAL LOW (ref >60)
Glucose, Bld: 115 mg/dL — ABNORMAL HIGH (ref 70–99)
Potassium: 2.9 mmol/L — ABNORMAL LOW (ref 3.5–5.1)
Sodium: 136 mmol/L (ref 135–145)
Total Bilirubin: 1.3 mg/dL — ABNORMAL HIGH (ref 0.3–1.2)
Total Protein: 8.5 g/dL — ABNORMAL HIGH (ref 6.5–8.1)

## 2019-03-16 LAB — URINALYSIS, ROUTINE W REFLEX MICROSCOPIC
Bacteria, UA: NONE SEEN
Bilirubin Urine: NEGATIVE
Glucose, UA: NEGATIVE mg/dL
Ketones, ur: NEGATIVE mg/dL
Leukocytes,Ua: NEGATIVE
Nitrite: NEGATIVE
Protein, ur: 30 mg/dL — AB
RBC / HPF: >50 RBC/hpf — ABNORMAL HIGH (ref 0–5)
Specific Gravity, Urine: 1.009 (ref 1.005–1.030)
pH: 7 (ref 5.0–8.0)

## 2019-03-16 LAB — LIPASE, BLOOD: Lipase: 38 U/L (ref 11–51)

## 2019-03-16 NOTE — ED Triage Notes (Signed)
The pt is c/o abd pain for 2 weeks  With constipation  Nausea and vomited once

## 2019-03-17 ENCOUNTER — Other Ambulatory Visit: Payer: Self-pay

## 2019-03-17 ENCOUNTER — Emergency Department (HOSPITAL_COMMUNITY): Payer: Medicare Other

## 2019-03-17 ENCOUNTER — Inpatient Hospital Stay (HOSPITAL_COMMUNITY): Payer: Medicare Other

## 2019-03-17 DIAGNOSIS — R740 Nonspecific elevation of levels of transaminase and lactic acid dehydrogenase [LDH]: Secondary | ICD-10-CM

## 2019-03-17 DIAGNOSIS — R112 Nausea with vomiting, unspecified: Secondary | ICD-10-CM | POA: Diagnosis present

## 2019-03-17 DIAGNOSIS — I5022 Chronic systolic (congestive) heart failure: Secondary | ICD-10-CM | POA: Diagnosis present

## 2019-03-17 DIAGNOSIS — R945 Abnormal results of liver function studies: Secondary | ICD-10-CM | POA: Diagnosis present

## 2019-03-17 DIAGNOSIS — K210 Gastro-esophageal reflux disease with esophagitis: Secondary | ICD-10-CM | POA: Diagnosis present

## 2019-03-17 DIAGNOSIS — I472 Ventricular tachycardia: Secondary | ICD-10-CM | POA: Diagnosis present

## 2019-03-17 DIAGNOSIS — K5730 Diverticulosis of large intestine without perforation or abscess without bleeding: Secondary | ICD-10-CM | POA: Diagnosis present

## 2019-03-17 DIAGNOSIS — N189 Chronic kidney disease, unspecified: Secondary | ICD-10-CM

## 2019-03-17 DIAGNOSIS — Z7901 Long term (current) use of anticoagulants: Secondary | ICD-10-CM

## 2019-03-17 DIAGNOSIS — E279 Disorder of adrenal gland, unspecified: Secondary | ICD-10-CM | POA: Diagnosis present

## 2019-03-17 DIAGNOSIS — D123 Benign neoplasm of transverse colon: Secondary | ICD-10-CM | POA: Diagnosis present

## 2019-03-17 DIAGNOSIS — E876 Hypokalemia: Secondary | ICD-10-CM | POA: Diagnosis present

## 2019-03-17 DIAGNOSIS — R1013 Epigastric pain: Secondary | ICD-10-CM

## 2019-03-17 DIAGNOSIS — D631 Anemia in chronic kidney disease: Secondary | ICD-10-CM | POA: Diagnosis present

## 2019-03-17 DIAGNOSIS — K7460 Unspecified cirrhosis of liver: Secondary | ICD-10-CM | POA: Diagnosis present

## 2019-03-17 DIAGNOSIS — N200 Calculus of kidney: Secondary | ICD-10-CM | POA: Diagnosis present

## 2019-03-17 DIAGNOSIS — N179 Acute kidney failure, unspecified: Secondary | ICD-10-CM | POA: Diagnosis present

## 2019-03-17 DIAGNOSIS — I130 Hypertensive heart and chronic kidney disease with heart failure and stage 1 through stage 4 chronic kidney disease, or unspecified chronic kidney disease: Secondary | ICD-10-CM | POA: Diagnosis present

## 2019-03-17 DIAGNOSIS — D124 Benign neoplasm of descending colon: Secondary | ICD-10-CM | POA: Diagnosis present

## 2019-03-17 DIAGNOSIS — K829 Disease of gallbladder, unspecified: Secondary | ICD-10-CM | POA: Diagnosis present

## 2019-03-17 DIAGNOSIS — R9431 Abnormal electrocardiogram [ECG] [EKG]: Secondary | ICD-10-CM

## 2019-03-17 DIAGNOSIS — R109 Unspecified abdominal pain: Secondary | ICD-10-CM | POA: Diagnosis present

## 2019-03-17 DIAGNOSIS — N184 Chronic kidney disease, stage 4 (severe): Secondary | ICD-10-CM | POA: Diagnosis present

## 2019-03-17 DIAGNOSIS — I480 Paroxysmal atrial fibrillation: Secondary | ICD-10-CM

## 2019-03-17 DIAGNOSIS — K766 Portal hypertension: Secondary | ICD-10-CM | POA: Diagnosis present

## 2019-03-17 DIAGNOSIS — K317 Polyp of stomach and duodenum: Secondary | ICD-10-CM | POA: Diagnosis present

## 2019-03-17 DIAGNOSIS — I428 Other cardiomyopathies: Secondary | ICD-10-CM | POA: Diagnosis present

## 2019-03-17 DIAGNOSIS — Z95810 Presence of automatic (implantable) cardiac defibrillator: Secondary | ICD-10-CM

## 2019-03-17 DIAGNOSIS — Z20828 Contact with and (suspected) exposure to other viral communicable diseases: Secondary | ICD-10-CM | POA: Diagnosis present

## 2019-03-17 DIAGNOSIS — D122 Benign neoplasm of ascending colon: Secondary | ICD-10-CM | POA: Diagnosis present

## 2019-03-17 DIAGNOSIS — E860 Dehydration: Secondary | ICD-10-CM | POA: Diagnosis present

## 2019-03-17 DIAGNOSIS — E785 Hyperlipidemia, unspecified: Secondary | ICD-10-CM | POA: Diagnosis present

## 2019-03-17 DIAGNOSIS — K621 Rectal polyp: Secondary | ICD-10-CM | POA: Diagnosis present

## 2019-03-17 LAB — MAGNESIUM: Magnesium: 2.9 mg/dL — ABNORMAL HIGH (ref 1.7–2.4)

## 2019-03-17 LAB — SARS CORONAVIRUS 2 (TAT 6-24 HRS): SARS Coronavirus 2: NEGATIVE

## 2019-03-17 MED ORDER — POTASSIUM CHLORIDE 10 MEQ/100ML IV SOLN
10.0000 meq | INTRAVENOUS | Status: AC
  Administered 2019-03-17 (×2): 10 meq via INTRAVENOUS
  Filled 2019-03-17 (×2): qty 100

## 2019-03-17 MED ORDER — MAGNESIUM OXIDE 400 (241.3 MG) MG PO TABS
400.0000 mg | ORAL_TABLET | Freq: Every day | ORAL | Status: DC
  Administered 2019-03-17 – 2019-03-21 (×5): 400 mg via ORAL
  Filled 2019-03-17 (×5): qty 1

## 2019-03-17 MED ORDER — FERROUS GLUCONATE 324 (38 FE) MG PO TABS
324.0000 mg | ORAL_TABLET | Freq: Two times a day (BID) | ORAL | Status: DC
  Administered 2019-03-17 – 2019-03-21 (×8): 324 mg via ORAL
  Filled 2019-03-17 (×9): qty 1

## 2019-03-17 MED ORDER — TECHNETIUM TC 99M MEBROFENIN IV KIT
5.3000 | PACK | Freq: Once | INTRAVENOUS | Status: AC | PRN
  Administered 2019-03-17: 15:00:00 5.3 via INTRAVENOUS

## 2019-03-17 MED ORDER — ONDANSETRON HCL 4 MG/2ML IJ SOLN
4.0000 mg | Freq: Once | INTRAMUSCULAR | Status: AC
  Administered 2019-03-17: 4 mg via INTRAVENOUS
  Filled 2019-03-17: qty 2

## 2019-03-17 MED ORDER — ONDANSETRON 4 MG PO TBDP
4.0000 mg | ORAL_TABLET | Freq: Once | ORAL | Status: AC
  Administered 2019-03-17: 4 mg via ORAL
  Filled 2019-03-17: qty 1

## 2019-03-17 MED ORDER — SODIUM CHLORIDE 0.9% FLUSH
3.0000 mL | Freq: Two times a day (BID) | INTRAVENOUS | Status: DC
  Administered 2019-03-17 – 2019-03-21 (×6): 3 mL via INTRAVENOUS

## 2019-03-17 MED ORDER — SODIUM CHLORIDE 0.9 % IV SOLN
Freq: Once | INTRAVENOUS | Status: AC
  Administered 2019-03-17: 09:00:00 via INTRAVENOUS

## 2019-03-17 MED ORDER — ENSURE ENLIVE PO LIQD
237.0000 mL | Freq: Every day | ORAL | Status: DC
  Administered 2019-03-18 – 2019-03-21 (×3): 237 mL via ORAL

## 2019-03-17 MED ORDER — POTASSIUM CHLORIDE CRYS ER 20 MEQ PO TBCR
40.0000 meq | EXTENDED_RELEASE_TABLET | Freq: Once | ORAL | Status: AC
  Administered 2019-03-17: 40 meq via ORAL
  Filled 2019-03-17: qty 2

## 2019-03-17 MED ORDER — MORPHINE SULFATE (PF) 2 MG/ML IV SOLN
2.0000 mg | INTRAVENOUS | Status: DC | PRN
  Administered 2019-03-17: 2 mg via INTRAVENOUS
  Filled 2019-03-17: qty 1

## 2019-03-17 MED ORDER — PANTOPRAZOLE SODIUM 40 MG IV SOLR
40.0000 mg | Freq: Two times a day (BID) | INTRAVENOUS | Status: DC
  Administered 2019-03-17 – 2019-03-21 (×9): 40 mg via INTRAVENOUS
  Filled 2019-03-17 (×9): qty 40

## 2019-03-17 MED ORDER — MORPHINE SULFATE (PF) 2 MG/ML IV SOLN
2.0000 mg | Freq: Once | INTRAVENOUS | Status: AC
  Administered 2019-03-17: 09:00:00 2 mg via INTRAVENOUS
  Filled 2019-03-17: qty 1

## 2019-03-17 MED ORDER — ALBUTEROL SULFATE (2.5 MG/3ML) 0.083% IN NEBU: 2.5000 mg | INHALATION_SOLUTION | Freq: Four times a day (QID) | RESPIRATORY_TRACT | Status: DC | PRN

## 2019-03-17 MED ORDER — ALUM & MAG HYDROXIDE-SIMETH 200-200-20 MG/5ML PO SUSP
15.0000 mL | Freq: Once | ORAL | Status: AC
  Administered 2019-03-17: 15 mL via ORAL
  Filled 2019-03-17: qty 30

## 2019-03-17 MED ORDER — MAGNESIUM SULFATE 2 GM/50ML IV SOLN
2.0000 g | Freq: Once | INTRAVENOUS | Status: AC
  Administered 2019-03-17: 2 g via INTRAVENOUS
  Filled 2019-03-17: qty 50

## 2019-03-17 MED ORDER — SODIUM CHLORIDE 0.9 % IV SOLN: INTRAVENOUS | Status: DC

## 2019-03-17 MED ORDER — LORAZEPAM 2 MG/ML IJ SOLN: 0.2500 mg | Freq: Four times a day (QID) | INTRAMUSCULAR | Status: DC | PRN

## 2019-03-17 MED ORDER — SODIUM CHLORIDE 0.9 % IV BOLUS
500.0000 mL | Freq: Once | INTRAVENOUS | Status: AC
  Administered 2019-03-17: 500 mL via INTRAVENOUS

## 2019-03-17 NOTE — ED Notes (Signed)
Patient transported to Ultrasound 

## 2019-03-17 NOTE — ED Notes (Signed)
Lunch tray ordered 

## 2019-03-17 NOTE — Plan of Care (Signed)
Pt is alert and oriented X4. Able to communicate needs. Skin warm and dry. Pt oriented to the room. Call bell within reach. No respiratory distress noted at the moment. Will monitor pt closely

## 2019-03-17 NOTE — ED Notes (Signed)
Dinner tray ordered.

## 2019-03-17 NOTE — ED Notes (Signed)
Patient transported to CT 

## 2019-03-17 NOTE — Consult Note (Signed)
Referring Provider: ER Primary Care Physician:  Josetta Huddle, MD Primary Gastroenterologist:  Dr. Therisa Doyne  Reason for Consultation:  Abdominal pain   HPI: Stephanie Yates is a 73 y.o. female with past medical history of atrial fibrillation on Eliquis, history of cardiomyopathy with EF of 15% status post AICD placement, history of chronic kidney disease and history of chronic/recurrent abdominal pain presented to the hospital with abdominal pain.  She was admitted to the hospital in July 2020 with epigastric abdominal pain.  EGD by Dr. Michail Sermon at that time showed antral inflammation.  Biopsies were negative for H. pylori.  Also had some mucosal changes in esophagus.  Her abdominal pain was thought to be chronic in nature from possible chronic mesenteric ischemia.  Patient seen and examined at bedside in the emergency room.  According to patient, she was having intermittent pain since discharge in July.  Around 2 weeks ago she started noticing worsening right-sided abdominal pain associate with nausea and vomiting.  Also complaining of dark stools with iron supplements.  Denies any bright blood in the stool.  Her pain is worse with food intake.  Pain does not get better with bowel movement.   Past Medical History:  Diagnosis Date  . Chronic anemia   . Chronic systolic CHF (congestive heart failure) ([place])   . CKD (chronic kidney disease), stage IV ([place])   . Former tobacco use   . History of tobacco abuse   . Hyperlipidemia   . Hypertension   . Mitral regurgitation   . Nonischemic cardiomyopathy ([place])    a. EF 10-15% on 11/2012 cath with no significant CAD. b. EF 25% in 2019. b. EF 15% in 12/2018  . PAF (paroxysmal atrial fibrillation) ([place])   . Protein calorie malnutrition ([place])   . Quit consuming alcohol in remote past   . Sinus bradycardia 12/21/2012  . Ventricular tachycardia ([place]) 12/14/2012    Past Surgical History:  Procedure Laterality Date  . BIOPSY  01/22/2019   Procedure: BIOPSY;   Surgeon: Wilford Corner, MD;  Location: [place];  Service: Endoscopy;;  . ESOPHAGOGASTRODUODENOSCOPY (EGD) WITH PROPOFOL N/A 06/18/2017   Procedure: ESOPHAGOGASTRODUODENOSCOPY (EGD) WITH PROPOFOL;  Surgeon: Ronnette Juniper, MD;  Location: [place];  Service: Gastroenterology;  Laterality: N/A;  . ESOPHAGOGASTRODUODENOSCOPY (EGD) WITH PROPOFOL N/A 01/22/2019   Procedure: ESOPHAGOGASTRODUODENOSCOPY (EGD) WITH PROPOFOL;  Surgeon: Wilford Corner, MD;  Location: [place];  Service: Endoscopy;  Laterality: N/A;  . IMPLANTABLE CARDIOVERTER DEFIBRILLATOR IMPLANT  12/19/2012   St. Jude dual-chamber ICD, serial number [id number]  . IMPLANTABLE CARDIOVERTER DEFIBRILLATOR IMPLANT N/A 12/19/2012   Procedure: IMPLANTABLE CARDIOVERTER DEFIBRILLATOR IMPLANT;  Surgeon: Deboraha Sprang, MD;  Location: [facility] CATH LAB;  Service: Cardiovascular;  Laterality: N/A;  . LEFT AND RIGHT HEART CATHETERIZATION WITH CORONARY ANGIOGRAM N/A 12/17/2012   Procedure: LEFT AND RIGHT HEART CATHETERIZATION WITH CORONARY ANGIOGRAM;  Surgeon: Sherren Mocha, MD;  Location: [facility] CATH LAB;  Service: Cardiovascular;  Laterality: N/A;  . SCLEROTHERAPY  01/22/2019   Procedure: SCLEROTHERAPY;  Surgeon: Wilford Corner, MD;  Location: [facility] ENDOSCOPY;  Service: Endoscopy;;    Prior to Admission medications   Medication Sig Start Date End Date Taking? Authorizing Provider  amiodarone (PACERONE) 200 MG tablet Take 1 tablet (200 mg total) by mouth daily. 02/15/19  Yes [name], PA-C  apixaban (ELIQUIS) 5 MG TABS tablet Take 1 tablet (5 mg total) by mouth 2 (two) times daily. 01/12/19 03/27/19 Yes Guilford Shi, MD  atorvastatin (LIPITOR) 40 MG tablet Take 1 tablet (40  mg total) by mouth daily at 6 PM. 07/05/18  Yes Deboraha Sprang, MD  carvedilol (COREG) 3.125 MG tablet Take 1 tablet (3.125 mg total) by mouth 2 (two) times daily with a meal. 09/12/18  Yes Deboraha Sprang, MD  Cholecalciferol (VITAMIN D-3) 25 MCG (1000 UT) CAPS Take  1,000 Units by mouth at bedtime.    Yes [provider]  Ensure (ENSURE) Take 237 mLs by mouth daily.   Yes [provider]  ferrous gluconate (FERGON) 324 MG tablet Take 324 mg by mouth 2 (two) times daily.  07/05/18  Yes [provider]  furosemide (LASIX) 80 MG tablet Take 1 tablet (80 mg total) by mouth daily. 07/05/18  Yes Deboraha Sprang, MD  hydrALAZINE (APRESOLINE) 25 MG tablet Take 0.5 tablets (12.5 mg total) by mouth 3 (three) times daily. 01/12/19  Yes Guilford Shi, MD  isosorbide mononitrate (IMDUR) 30 MG 24 hr tablet Take 0.5 tablets (15 mg total) by mouth daily. 01/13/19  Yes Guilford Shi, MD  MAGNESIUM-OXIDE 400 (241.3 Mg) MG tablet TAKE 1 TABLET BY MOUTH ONCE DAILY Patient taking differently: Take 400 mg by mouth daily as needed (antacid).  02/07/19  Yes Deboraha Sprang, MD  metolazone (ZAROXOLYN) 2.5 MG tablet Take 1 tablet (2.5 mg total) by mouth daily as needed (leg swellings or shortness of breath or weight gain>5 pounds in 48 hours). 01/12/19  Yes Guilford Shi, MD  mexiletine (MEXITIL) 200 MG capsule Take 1 capsule (200 mg total) by mouth 2 (two) times daily. 01/24/19  Yes [name], PA-C  nitroGLYCERIN (NITROSTAT) 0.4 MG SL tablet Place 1 tablet (0.4 mg total) under the tongue every 5 (five) minutes as needed for chest pain. 01/12/19  Yes Guilford Shi, MD  ondansetron (ZOFRAN ODT) 4 MG disintegrating tablet Take 1 tablet (4 mg total) by mouth every 8 (eight) hours as needed for nausea or vomiting. 02/23/19  Yes Robinson, [place] N, PA-C  pantoprazole (PROTONIX) 40 MG tablet Take 1 tablet (40 mg total) by mouth 2 (two) times daily before a meal. 06/18/17  Yes Lavina Hamman, MD  levalbuterol [facility] HFA) 45 MCG/ACT inhaler Inhale 1 puff into the lungs every 4 (four) hours as needed for wheezing. Patient not taking: Reported on 02/23/2019 01/12/19 01/12/20  Guilford Shi, MD    Scheduled Meds: . pantoprazole (PROTONIX) IV  40 mg  Intravenous Q12H   Continuous Infusions: PRN Meds:.morphine injection  Allergies as of 03/16/2019 - Review Complete 03/16/2019  Allergen Reaction Noted  . Strawberry extract Hives, Itching, and Swelling 12/14/2012    Family History  Problem Relation Age of Onset  . Cancer Mother     Social History   Socioeconomic History  . Marital status: Widowed    Spouse name: Not on file  . Number of children: Not on file  . Years of education: Not on file  . Highest education level: Not on file  Occupational History  . Not on file  Social Needs  . Financial resource strain: Not on file  . Food insecurity    Worry: Not on file    Inability: Not on file  . Transportation needs    Medical: Not on file    Non-medical: Not on file  Tobacco Use  . Smoking status: Former Research scientist (life sciences)  . Smokeless tobacco: Never Used  Substance and Sexual Activity  . Alcohol use: No  . Drug use: No  . Sexual activity: Not Currently  Lifestyle  . Physical activity  Days per week: Not on file    Minutes per session: Not on file  . Stress: Not on file  Relationships  . Social Herbalist on phone: Not on file    Gets together: Not on file    Attends religious service: Not on file    Active member of club or organization: Not on file    Attends meetings of clubs or organizations: Not on file    Relationship status: Not on file  . Intimate partner violence    Fear of current or ex partner: Not on file    Emotionally abused: Not on file    Physically abused: Not on file    Forced sexual activity: Not on file  Other Topics Concern  . Not on file  Social History Narrative  . Not on file    Review of Systems: All negative except as stated above in HPI.  Physical Exam: Vital signs: Vitals:   03/17/19 0500 03/17/19 1036  BP: 133/62 (!) 115/58  Pulse: 60 60  Resp: 11 12  Temp:    SpO2: 95% 92%     Physical Exam  Constitutional: She is oriented to person, place, and time. She appears  well-developed and well-nourished. No distress.  HENT:  Head: Normocephalic and atraumatic.  Eyes: EOM are normal. No scleral icterus.  Neck: Normal range of motion. Neck supple.  Cardiovascular: Normal rate.  IRRR,  Pulmonary/Chest: Effort normal and breath sounds normal. No respiratory distress.  Abdominal: Soft. She exhibits no distension. There is abdominal tenderness. There is no rebound and no guarding.  Right upper quadrant tenderness to palpation  Musculoskeletal: Normal range of motion.        General: No edema.  Neurological: She is alert and oriented to person, place, and time.  Skin: Skin is warm. No erythema.  Psychiatric: She has a normal mood and affect. Judgment normal.  Vitals reviewed.   GI:  Lab Results: Recent Labs    03/16/19 2049  WBC 6.2  HGB 12.8  HCT 39.9  PLT 276   BMET Recent Labs    03/16/19 2049  NA 136  K 2.9*  CL 97*  CO2 28  GLUCOSE 115*  BUN 60*  CREATININE 2.94*  CALCIUM 9.2   LFT Recent Labs    03/16/19 2049  PROT 8.5*  ALBUMIN 3.5  AST 147*  ALT 75*  ALKPHOS 81  BILITOT 1.3*   PT/INR No results for input(s): LABPROT, INR in the last 72 hours.   Studies/Results: Ct Renal Stone Study  Result Date: 03/17/2019 CLINICAL DATA:  Hematuria EXAM: CT ABDOMEN AND PELVIS WITHOUT CONTRAST TECHNIQUE: Multidetector CT imaging of the abdomen and pelvis was performed following the standard protocol without IV contrast. COMPARISON:  01/28/2019 FINDINGS: LOWER CHEST: Moderate cardiomegaly HEPATOBILIARY: Mildly nodular hepatic contours with relative hypertrophy of the caudate and left hepatic lobe, consistent with hepatic cirrhosis. No focal liver lesion. No biliary dilatation. Hyperdensity in the gallbladder may be vicarious excretion of contrast related to a prior study. PANCREAS: The pancreatic parenchymal contours are normal and there is no ductal dilatation. There is no peripancreatic fluid collection. SPLEEN: Normal. ADRENALS/URINARY  TRACT: --Adrenal glands: Unchanged right adrenal myelolipoma --Right kidney/ureter: Single nonobstructing renal calculus measuring 6 mm. No hydronephrosis, perinephric stranding or solid renal mass. --Left kidney/ureter: Single nonobstructing renal calculus measuring 3 mm. No hydronephrosis, perinephric stranding or solid renal mass. --Urinary bladder: Normal for degree of distention STOMACH/BOWEL: --Stomach/Duodenum: Mild wall thickening at the gastric fundus.  Normal duodenal course. --Small bowel: No dilatation or inflammation. --Colon: Extensive diverticulosis without acute inflammation. --Appendix: Not visualized. No right lower quadrant inflammation or free fluid. VASCULAR/LYMPHATIC: There is aortic atherosclerosis without hemodynamically significant stenosis. No abdominal or pelvic lymphadenopathy. REPRODUCTIVE: Small calcified uterine fibroid. No adnexal mass. MUSCULOSKELETAL. Grade 1 anterolisthesis at L4-5. OTHER: None. IMPRESSION: 1. No obstructive uropathy or other acute abdominal or pelvic abnormality. 2. Nonobstructive bilateral nephrolithiasis. 3. Hepatic cirrhosis. 4. Aortic Atherosclerosis (ICD10-I70.0). Electronically Signed   By: Ulyses Jarred M.D.   On: 03/17/2019 06:02   US Abdomen Limited Ruq  Result Date: 03/17/2019 CLINICAL DATA:  Epigastric pain for 2 weeks, nausea and vomiting. EXAM: ULTRASOUND ABDOMEN LIMITED RIGHT UPPER QUADRANT COMPARISON:  None. FINDINGS: Gallbladder: Gallbladder walls appear thickened, with a demonstrated measurement of 7 mm. Heterogeneous masslike structure at the gallbladder fundus measures 2.6 cm greatest dimension. No gallstones seen. No sonographic Murphy's sign elicited during the exam. Common bile duct: Diameter: 5 mm Liver: Liver echotexture appears coarsened and the peripheral liver contours appear slightly nodular suggesting underlying cirrhosis. No focal liver abnormality identified. Portal vein is patent on color Doppler imaging with normal direction of  blood flow towards the liver. Other: None. IMPRESSION: 1. Gallbladder walls are thickened. Gallbladder wall thickening is nonspecific with differential considerations of cholecystitis, CHF, hypoproteinemia and reactive thickening secondary to adjacent liver disease. Given the cirrhotic appearing liver, I suspect this gallbladder wall thickening is reactive to adjacent liver disease. Given the epigastric pain, would consider nuclear medicine HIDA scan to exclude the less likely possibility of cholecystitis. 2. Heterogeneous masslike structure demonstrated at the gallbladder fundus, measuring 2.6 cm greatest dimension. Based on today's earlier CT, I suspect that this is abutting colon with heterogeneous appearance secondary to underlying diverticulosis. At minimum, recommend follow-up gallbladder ultrasound in 3 months to ensure stability or resolution and thereby ensure benignity. If persistent or increased on subsequent ultrasound, would then recommend MRI for definitive characterization. 3. Cirrhotic appearing liver. Electronically Signed   By: Franki Cabot M.D.   On: 03/17/2019 08:30    Impression/Plan: -Right upper quadrant and epigastric abdominal pain.  CT scan showed cirrhosis otherwise no acute changes.  Ultrasound showed gallbladder wall thickening with questionable heterogenous masslike structure at the gallbladder fundus.  Multiple recent imaging did not showed these findings. -Chronic abdominal pain. -Abnormal LFTs.  Mild elevated AST and ALT.  Could be from underlying cirrhosis -Occult blood positive.  No anemia. -Abnormal CT scan showing thickening of gastric fundus.  History of ulcer disease.  Recent EGD in July 2020 showed no significant abnormality in the cardia/fundus.  Recommendations -------------------------- -follow  HIDA scan. -If HIDA scan negative, see may need inpatient colonoscopy for further evaluation of ongoing/chronic abdominal pain and occult blood positive stool. -Okay  to have liquid diet after HIDA scan from GI standpoint. -Check INR.  GI will follow    LOS: 0 days   Otis Brace  MD, FACP 03/17/2019, 1:13 PM  Contact #  [phone]

## 2019-03-17 NOTE — H&P (Addendum)
History and Physical    Stephanie Yates RXV:[id number] DOB: 1945-07-04 DOA: 03/16/2019  Referring MD/NP/PA: Simmie Davies, MD PCP: Josetta Huddle, MD  Patient coming from: Home   Chief Complaint: Abdominal pain  I have personally briefly reviewed patient's old medical records in [place]   HPI: Stephanie Yates is a 73 y.o. female with medical history significant of chronic anemia, paroxysmal atrial fibrillation on Eliquis, NICM with EF 15% s/p ICD, CKD stage IV, hypertension, hyperlipidemia; who presents with complaints of epigastric abdominal pain over the last 2 weeks.  This only the patient's or hospitalization in the last 3 months.  Previously evaluated with EGD by Dr. Michail Sermon revealing linear areas of desquamation mucosa of the esophagus and congested.  Gastric antrum was noted to be erythematous, eroded inflamed, and nodular which biopsies were taken.  Each time patient reports that she comes to the hospital feels a little better and symptoms return.  She describes the pain is achy and we will radiate across her upper stomach.  She reports throwing up after eating most foods and even some liquids or medicines.  Daughter makes note that the patient has been eating lots of fried/greasy foods, going to [employer], and drinking sodas wondering why her stomach hurts.  She already had a virtual GI appointment set up for the 22nd of this month.   Associated symptoms include lightheadedness, dysuria, hematuria, intermittent shortness of breath, and chronically has chills.  The doctor had given her some medication for constipation that has helped her have more regular bowel movements.  Denies having any fever, chest pain, diarrhea, or significant leg swelling.   ED Course: Upon admission patient was noted to be afebrile and hemodynamically stable.  Labs significant for potassium 2.9, BUN 60, creatinine 2.94, AST 147, ALT 75, total bilirubin 1.3, and all other labs relatively within normal limits.   CT scan of the abdomen and pelvis revealed no acute cause for patient's symptoms, hepatic cirrhosis, and nonobstructing bilateral nephrolithiasis.  Gallbladder wall thickening for which a HIDA scan was recommended.  Gastroenterology was consulted. Patient was given 500 ml of normal saline IV fluids, 40 mg of potassium chloride, 2 g of magnesium sulfate, GI cocktail, Zofran, and morphine.  TRH called to admit.  Review of systems: A complete 10 point review of systems was performed and negative except for as noted above in HPI.  Past Medical History:  Diagnosis Date  . Chronic anemia   . Chronic systolic CHF (congestive heart failure) ([place])   . CKD (chronic kidney disease), stage IV ([place])   . Former tobacco use   . History of tobacco abuse   . Hyperlipidemia   . Hypertension   . Mitral regurgitation   . Nonischemic cardiomyopathy ([place])    a. EF 10-15% on 11/2012 cath with no significant CAD. b. EF 25% in 2019. b. EF 15% in 12/2018  . PAF (paroxysmal atrial fibrillation) ([place])   . Protein calorie malnutrition ([place])   . Quit consuming alcohol in remote past   . Sinus bradycardia 12/21/2012  . Ventricular tachycardia ([place]) 12/14/2012    Past Surgical History:  Procedure Laterality Date  . BIOPSY  01/22/2019   Procedure: BIOPSY;  Surgeon: Wilford Corner, MD;  Location: [place];  Service: Endoscopy;;  . ESOPHAGOGASTRODUODENOSCOPY (EGD) WITH PROPOFOL N/A 06/18/2017   Procedure: ESOPHAGOGASTRODUODENOSCOPY (EGD) WITH PROPOFOL;  Surgeon: Ronnette Juniper, MD;  Location: [place];  Service: Gastroenterology;  Laterality: N/A;  . ESOPHAGOGASTRODUODENOSCOPY (EGD) WITH PROPOFOL N/A 01/22/2019  Procedure: ESOPHAGOGASTRODUODENOSCOPY (EGD) WITH PROPOFOL;  Surgeon: Wilford Corner, MD;  Location: [place];  Service: Endoscopy;  Laterality: N/A;  . IMPLANTABLE CARDIOVERTER DEFIBRILLATOR IMPLANT  12/19/2012   St. Jude dual-chamber ICD, serial number [id number]  . IMPLANTABLE CARDIOVERTER  DEFIBRILLATOR IMPLANT N/A 12/19/2012   Procedure: IMPLANTABLE CARDIOVERTER DEFIBRILLATOR IMPLANT;  Surgeon: Deboraha Sprang, MD;  Location: [facility] CATH LAB;  Service: Cardiovascular;  Laterality: N/A;  . LEFT AND RIGHT HEART CATHETERIZATION WITH CORONARY ANGIOGRAM N/A 12/17/2012   Procedure: LEFT AND RIGHT HEART CATHETERIZATION WITH CORONARY ANGIOGRAM;  Surgeon: Sherren Mocha, MD;  Location: [facility] C[facility] LAB;  Service: Cardiovascular;  Laterality: N/A;  . SCLEROTHERAPY  01/22/2019   Procedure: SCLEROTHERAPY;  Surgeon: Wilford Corner, MD;  Location: [facility] ENDOSCOPY;  Service: Endoscopy;;     reports that she has quit smoking. She has never used smokeless tobacco. She reports that she does not drink alcohol or use drugs.  Allergies  Allergen Reactions  . Strawberry Extract Hives, Itching and Swelling         Family History  Problem Relation Age of Onset  . Cancer Mother     Prior to Admission medications   Medication Sig Start Date End Date Taking? Authorizing Provider  amiodarone (PACERONE) 200 MG tablet Take 1 tablet (200 mg total) by mouth daily. 02/15/19   [name], PA-C  apixaban (ELIQUIS) 5 MG TABS tablet Take 1 tablet (5 mg total) by mouth 2 (two) times daily. 01/12/19 03/27/19  Guilford Shi, MD  atorvastatin (LIPITOR) 40 MG tablet Take 1 tablet (40 mg total) by mouth daily at 6 PM. 07/05/18   Deboraha Sprang, MD  carvedilol (COREG) 3.125 MG tablet Take 1 tablet (3.125 mg total) by mouth 2 (two) times daily with a meal. 09/12/18   Deboraha Sprang, MD  Cholecalciferol (VITAMIN D-3) 25 MCG (1000 UT) CAPS Take 1,000 Units by mouth at bedtime.     [provider]  Ensure (ENSURE) Take 237 mLs by mouth daily.    [provider]  ferrous gluconate (FERGON) 324 MG tablet Take 324 mg by mouth 2 (two) times daily.  07/05/18   [provider]  furosemide (LASIX) 80 MG tablet Take 1 tablet (80 mg total) by mouth daily. 07/05/18   Deboraha Sprang, MD  hydrALAZINE  (APRESOLINE) 25 MG tablet Take 0.5 tablets (12.5 mg total) by mouth 3 (three) times daily. 01/12/19   Guilford Shi, MD  isosorbide mononitrate (IMDUR) 30 MG 24 hr tablet Take 0.5 tablets (15 mg total) by mouth daily. 01/13/19   Guilford Shi, MD  levalbuterol (XOPENEX HFA) 45 MCG/ACT inhaler Inhale 1 puff into the lungs every 4 (four) hours as needed for wheezing. Patient not taking: Reported on 02/23/2019 01/12/19 01/12/20  Guilford Shi, MD  MAGNESIUM-OXIDE 400 (241.3 Mg) MG tablet TAKE 1 TABLET BY MOUTH ONCE DAILY Patient taking differently: Take 400 mg by mouth daily.  02/07/19   Deboraha Sprang, MD  metolazone (ZAROXOLYN) 2.5 MG tablet Take 1 tablet (2.5 mg total) by mouth daily as needed (leg swellings or shortness of breath or weight gain>5 pounds in 48 hours). 01/12/19   Guilford Shi, MD  mexiletine (MEXITIL) 200 MG capsule Take 1 capsule (200 mg total) by mouth 2 (two) times daily. 01/24/19   [name], PA-C  nitroGLYCERIN (NITROSTAT) 0.4 MG SL tablet Place 1 tablet (0.4 mg total) under the tongue every 5 (five) minutes as needed for chest pain. 01/12/19   Guilford Shi, MD  ondansetron [facility]  ODT) 4 MG disintegrating tablet Take 1 tablet (4 mg total) by mouth every 8 (eight) hours as needed for nausea or vomiting. 02/23/19   Robinson, [place] N, PA-C  pantoprazole (PROTONIX) 40 MG tablet Take 1 tablet (40 mg total) by mouth 2 (two) times daily before a meal. 06/18/17   Lavina Hamman, MD    Physical Exam:  Constitutional: Elderly female who appears to be in no acute distress at this time. Vitals:   03/17/19 0423 03/17/19 0424 03/17/19 0425 03/17/19 0500  BP:    133/62  Pulse: (!) 59 (!) 59 (!) 59 60  Resp:  20 16 11   Temp:      TempSrc:      SpO2: 96% 97% 95% 95%  Weight:      Height:       Eyes: PERRL, lids and conjunctivae normal ENMT: Mucous membranes are dry. Posterior pharynx clear of any exudate or lesions.  Neck: normal, supple, no masses, no  thyromegaly.  No JVD appreciated Respiratory: Normal respiratory effort with no significant wheezes or rhonchi appreciated. Cardiovascular: Bradycardic, no murmurs / rubs / gallops. No lower extremity edema. 2+ pedal pulses. No carotid bruits.  Abdomen: Epigastric tenderness appreciated, no masses palpated.  Bowel sounds positive.  Musculoskeletal: no clubbing / cyanosis. No joint deformity upper and lower extremities. Good ROM, no contractures. Normal muscle tone.  Skin: no rashes, lesions, ulcers. No induration Neurologic: CN 2-12 grossly intact. Sensation intact, DTR normal. Strength 5/5 in all 4.  Psychiatric: Normal judgment and insight. Alert and oriented x 3. Normal mood.     Labs on Admission: I have personally reviewed following labs and imaging studies  CBC: Recent Labs  Lab 03/16/19 2049  WBC 6.2  HGB 12.8  HCT 39.9  MCV 83.3  PLT 161   Basic Metabolic Panel: Recent Labs  Lab 03/16/19 2049  NA 136  K 2.9*  CL 97*  CO2 28  GLUCOSE 115*  BUN 60*  CREATININE 2.94*  CALCIUM 9.2   GFR: Estimated Creatinine Clearance: 14.8 mL/min (A) (by C-G formula based on SCr of 2.94 mg/dL (H)). Liver Function Tests: Recent Labs  Lab 03/16/19 2049  AST 147*  ALT 75*  ALKPHOS 81  BILITOT 1.3*  PROT 8.5*  ALBUMIN 3.5   Recent Labs  Lab 03/16/19 2049  LIPASE 38   No results for input(s): AMMONIA in the last 168 hours. Coagulation Profile: No results for input(s): INR, PROTIME in the last 168 hours. Cardiac Enzymes: No results for input(s): CKTOTAL, CKMB, CKMBINDEX, TROPONINI in the last 168 hours. BNP (last 3 results) No results for input(s): PROBNP in the last 8760 hours. HbA1C: No results for input(s): HGBA1C in the last 72 hours. CBG: No results for input(s): GLUCAP in the last 168 hours. Lipid Profile: No results for input(s): CHOL, HDL, LDLCALC, TRIG, CHOLHDL, LDLDIRECT in the last 72 hours. Thyroid Function Tests: No results for input(s): TSH, T4TOTAL,  FREET4, T3FREE, THYROIDAB in the last 72 hours. Anemia Panel: No results for input(s): VITAMINB12, FOLATE, FERRITIN, TIBC, IRON, RETICCTPCT in the last 72 hours. Urine analysis:    Component Value Date/Time   COLORURINE AMBER (A) 03/16/2019 2046   APPEARANCEUR HAZY (A) 03/16/2019 2046   LABSPEC 1.009 03/16/2019 2046   PHURINE 7.0 03/16/2019 2046   GLUCOSEU NEGATIVE 03/16/2019 2046   HGBUR LARGE (A) 03/16/2019 2046   BILIRUBINUR NEGATIVE 03/16/2019 2046   [place] NEGATIVE 03/16/2019 2046   PROTEINUR 30 (A) 03/16/2019 2046   UROBILINOGEN 1.0 08/29/2014  2035   NITRITE NEGATIVE 03/16/2019 2046   LEUKOCYTESUR NEGATIVE 03/16/2019 2046   Sepsis Labs: No results found for this or any previous visit (from the past 240 hour(s)).   Radiological Exams on Admission: Ct Renal Stone Study  Result Date: 03/17/2019 CLINICAL DATA:  Hematuria EXAM: CT ABDOMEN AND PELVIS WITHOUT CONTRAST TECHNIQUE: Multidetector CT imaging of the abdomen and pelvis was performed following the standard protocol without IV contrast. COMPARISON:  01/28/2019 FINDINGS: LOWER CHEST: Moderate cardiomegaly HEPATOBILIARY: Mildly nodular hepatic contours with relative hypertrophy of the caudate and left hepatic lobe, consistent with hepatic cirrhosis. No focal liver lesion. No biliary dilatation. Hyperdensity in the gallbladder may be vicarious excretion of contrast related to a prior study. PANCREAS: The pancreatic parenchymal contours are normal and there is no ductal dilatation. There is no peripancreatic fluid collection. SPLEEN: Normal. ADRENALS/URINARY TRACT: --Adrenal glands: Unchanged right adrenal myelolipoma --Right kidney/ureter: Single nonobstructing renal calculus measuring 6 mm. No hydronephrosis, perinephric stranding or solid renal mass. --Left kidney/ureter: Single nonobstructing renal calculus measuring 3 mm. No hydronephrosis, perinephric stranding or solid renal mass. --Urinary bladder: Normal for degree of  distention STOMACH/BOWEL: --Stomach/Duodenum: Mild wall thickening at the gastric fundus. Normal duodenal course. --Small bowel: No dilatation or inflammation. --Colon: Extensive diverticulosis without acute inflammation. --Appendix: Not visualized. No right lower quadrant inflammation or free fluid. VASCULAR/LYMPHATIC: There is aortic atherosclerosis without hemodynamically significant stenosis. No abdominal or pelvic lymphadenopathy. REPRODUCTIVE: Small calcified uterine fibroid. No adnexal mass. MUSCULOSKELETAL. Grade 1 anterolisthesis at L4-5. OTHER: None. IMPRESSION: 1. No obstructive uropathy or other acute abdominal or pelvic abnormality. 2. Nonobstructive bilateral nephrolithiasis. 3. Hepatic cirrhosis. 4. Aortic Atherosclerosis (ICD10-I70.0). Electronically Signed   By: Ulyses Jarred M.D.   On: 03/17/2019 06:02   US Abdomen Limited Ruq  Result Date: 03/17/2019 CLINICAL DATA:  Epigastric pain for 2 weeks, nausea and vomiting. EXAM: ULTRASOUND ABDOMEN LIMITED RIGHT UPPER QUADRANT COMPARISON:  None. FINDINGS: Gallbladder: Gallbladder walls appear thickened, with a demonstrated measurement of 7 mm. Heterogeneous masslike structure at the gallbladder fundus measures 2.6 cm greatest dimension. No gallstones seen. No sonographic Murphy's sign elicited during the exam. Common bile duct: Diameter: 5 mm Liver: Liver echotexture appears coarsened and the peripheral liver contours appear slightly nodular suggesting underlying cirrhosis. No focal liver abnormality identified. Portal vein is patent on color Doppler imaging with normal direction of blood flow towards the liver. Other: None. IMPRESSION: 1. Gallbladder walls are thickened. Gallbladder wall thickening is nonspecific with differential considerations of cholecystitis, CHF, hypoproteinemia and reactive thickening secondary to adjacent liver disease. Given the cirrhotic appearing liver, I suspect this gallbladder wall thickening is reactive to adjacent liver  disease. Given the epigastric pain, would consider nuclear medicine HIDA scan to exclude the less likely possibility of cholecystitis. 2. Heterogeneous masslike structure demonstrated at the gallbladder fundus, measuring 2.6 cm greatest dimension. Based on today's earlier CT, I suspect that this is abutting colon with heterogeneous appearance secondary to underlying diverticulosis. At minimum, recommend follow-up gallbladder ultrasound in 3 months to ensure stability or resolution and thereby ensure benignity. If persistent or increased on subsequent ultrasound, would then recommend MRI for definitive characterization. 3. Cirrhotic appearing liver. Electronically Signed   By: Franki Cabot M.D.   On: 03/17/2019 08:30    EKG: Independently reviewed.  Atrial paced rhythm at 60 bpm with QTc 570  Assessment/Plan Epigastric abdominal pain with nausea and vomiting: Acute on chronic.  Patient presents with this pain over the last 2 week note that the patient has been eating  a lot of fried and greasy foods that she should not.  Previously evaluated with EGD by Dr. Michail Sermon in 12/2018 and thought to be possibly related to gastritis.  Evaluation with ultrasound revealed mild gallbladder wall thickening which could be related to adjacent liver disease or cholecystitis.  HIDA scan was recommended.  GI was again consulted. -Admit to a medical telemetry -Antiemetics as needed -Clear liquid diet and advance as tolerated -Follow-up HIDA scan -Gentle IV fluids  -IV morphine as needed for pain -Will need to continue to stress to the patient the need of dietary modification as her current dietary habits can likely be contributing to her symptoms -Appreciate GI consultative services, we will follow-up for further recommendation  Hypokalemia: Initial potassium noted to be 2.9 on admission.  Patient received 40 mg of potassium chloride p.o. and 2 g of magnesium sulfate. -Give additional 30 mEq of potassium chloride IV  -Continue to monitor and replace as needed  Acute kidney injury superimposed on chronic kidney disease stage IV: Patient's baseline creatinine appears to be around 2.  However she presents with creatinine elevated to 2.94 with BUN 60.  The elevated BUN to creatinine ratio suggest a prerenal cause of symptoms. -Gentle IV fluids normal saline at 50 mL/h -Reassess creatinine in a.m.  -Holding nephrotoxic agents  Hematuria: Acute.  Could likely be secondary to dehydration and nonobstructing nephrolithiasis. -Continue to monitor  Transaminitis, hyperbilirubinemia: Acute on chronic.  On admission AST trending up at 147, ALT 75 total bilirubin 1.5.  CT imaging noting signs of cirrhosis.  Patient with previous negative for hepatitis B and C back in 05/2017. -Recheck CMP in a.m.  Anemia of chronic disease: Patient hemoglobin appears to be within normal limits at this time, but could be secondary to an aspect of hemoconcentration. -Recheck CBC in a.m.  Prolonged QT interval: QTC 570 -Correct electrolyte abnormalities -Recheck QT in a.m.  Systolic congestive heart failure, s/p ICD: Patient appears to be compensated at this time.  Last EF noted to be around 15% echocardiogram on 01/07/2019. -Strict I&Os -Daily weight -Hold diuretics, and reassess patient's fluid status in a.m. to determine when asked to restart  Paroxysmal atrial fibrillation on chronic anticoagulation: Patient appears to be sinus rhythm at this time.CHA2DS2-VASc score =4. -Hold Eliquis due to hematuria  Nephrolithiasis: Patient with nonobstructing bilateral nephrolithiasis.  Hyperlipidemia -Held statin, restart when medically appropriate  GERD -Continue Protonix IV  COVID screen: pending  DVT prophylaxis: SCD Code Status: Full Family Communication: Daughter updated over the phone Disposition Plan: Anticipate possible discharge after clinical improvement Consults called:none  Admission status: It is my clinical opinion  that referral for inpatient is reasonable and necessary given the patient's advanced congestive heart failure and need of gentle rehydration with IV fluids.  Norval Morton MD [facility] Pager [phone]   If 7PM-7AM, please contact night-coverage [email or web address] Password [email or web address]  03/17/2019, 8:59 AM

## 2019-03-17 NOTE — ED Provider Notes (Signed)
Signout  73 year old lady presenting to the ER with progressive abdominal pain.  CT scan unremarkable, labs concerning for worsening AKI, mild elevation in LFTs.  Right upper quadrant ultrasound ordered and pending at time of signout.  If right upper quadrant ultrasound negative for acute cholecystitis, anticipate admission to medicine for symptom control.  7:15 AM received signout from [place]\  8:45 AM reviewed results, will recheck patient, likely consult hospitalist for admission  8:54 AM reviewed results with patient; still having severe abdominal pain and nausea; will redose meds, start IVF, check ekg to monitor qt given need for multiple antiemetics  9:09 AM discussed with Tamala Julian who agrees to accept, requests case be discussed with GI, will page GI   Lucrezia Starch, MD 03/17/19 [phone]

## 2019-03-17 NOTE — ED Provider Notes (Signed)
[facility] EMERGENCY DEPARTMENT Provider Note   CSN: [id number] Arrival date & time: 03/16/19  2035     History   Chief Complaint Chief Complaint  Patient presents with   Abdominal Pain    HPI Stephanie Yates is a 73 y.o. female.     HPI  This is a 73 year old female with a history of heart failure, hypertension, hyperlipidemia, nonischemic cardiomyopathy who presents with abdominal pain.  Patient reports 2-week history of ongoing and progressive abdominal pain.  She reports generalized abdominal cramping that starts in the right upper quadrant and extends and radiates to the midline.  It is fairly constant.  It is sometimes associated with food.  She reports episodes of nausea and vomiting.  She previously was treated for constipation but reports that she is having bowel movements with laxatives.  Her last bowel movement was yesterday.  She denies any fevers, chest pain, shortness of breath, known sick contacts.  She reports hematuria.  No dysuria.  She denies any alcohol or drug use.  Past Medical History:  Diagnosis Date   Chronic anemia    Chronic systolic CHF (congestive heart failure) (HCC)    CKD (chronic kidney disease), stage IV (HCC)    Former tobacco use    History of tobacco abuse    Hyperlipidemia    Hypertension    Mitral regurgitation    Nonischemic cardiomyopathy ([place])    a. EF 10-15% on 11/2012 cath with no significant CAD. b. EF 25% in 2019. b. EF 15% in 12/2018   PAF (paroxysmal atrial fibrillation) (HCC)    Protein calorie malnutrition ([place])    Quit consuming alcohol in remote past    Sinus bradycardia 12/21/2012   Ventricular tachycardia ([place]) 12/14/2012    Patient Active Problem List   Diagnosis Date Noted   Epigastric abdominal pain 01/29/2019   Gastritis 01/28/2019   CKD (chronic kidney disease) stage 4, GFR 15-29 ml/min (HCC) 01/28/2019   VT (ventricular tachycardia) ([place]) 01/17/2019   AKI (acute kidney  injury) ([place]) 01/12/2019   Palliative care encounter    CHF exacerbation ([place]) 01/06/2019   Influenza A 09/02/2018   Lactic acidosis 09/02/2018   Chronic combined systolic and diastolic congestive heart failure ([place]) 09/02/2018   Rapid atrial fibrillation ([place]) 06/08/2018   Acute on chronic combined systolic and diastolic heart failure ([place]) 06/07/2018   Anemia 06/07/2018   Acute GI bleeding 06/15/2017   Chronic anticoagulation    Acute on chronic congestive heart failure ([place])    HLD (hyperlipidemia) 03/05/2017   CKD (chronic kidney disease), stage III ([place]) 03/05/2017   Chest pain 03/05/2017   Symptomatic anemia 03/05/2017   PAF (paroxysmal atrial fibrillation) ([place]) 04/01/2013   ICD (implantable cardioverter-defibrillator), dual, [place] 04/01/2013   PVC's (premature ventricular contractions) 04/01/2013   Hypokalemia 12/21/2012   History of tobacco abuse 12/21/2012   Elevated troponin 12/21/2012   Sinus bradycardia 12/21/2012   Hyperkalemia 12/20/2012   Nonischemic cardiomyopathy ([place]) 12/20/2012   UTI (urinary tract infection) 12/20/2012   Acute on chronic combined systolic and diastolic CHF (congestive heart failure) ([place]) 12/20/2012   Ventricular tachycardia ([place]) 12/14/2012   Hypertension 12/14/2012    Past Surgical History:  Procedure Laterality Date   BIOPSY  01/22/2019   Procedure: BIOPSY;  Surgeon: Wilford Corner, MD;  Location: [facility];  Service: Endoscopy;;   ESOPHAGOGASTRODUODENOSCOPY (EGD) WITH PROPOFOL N/A 06/18/2017   Procedure: ESOPHAGOGASTRODUODENOSCOPY (EGD) WITH PROPOFOL;  Surgeon: Ronnette Juniper, MD;  Location: [facility];  Service: Gastroenterology;  Laterality: N/A;   ESOPHAGOGASTRODUODENOSCOPY (EGD) WITH PROPOFOL N/A 01/22/2019   Procedure: ESOPHAGOGASTRODUODENOSCOPY (EGD) WITH PROPOFOL;  Surgeon: Wilford Corner, MD;  Location: [place];  Service: Endoscopy;  Laterality: N/A;   IMPLANTABLE CARDIOVERTER  DEFIBRILLATOR IMPLANT  12/19/2012   [employer] Jude dual-chamber ICD, serial number [id number]   IMPLANTABLE CARDIOVERTER DEFIBRILLATOR IMPLANT N/A 12/19/2012   Procedure: IMPLANTABLE CARDIOVERTER DEFIBRILLATOR IMPLANT;  Surgeon: Deboraha Sprang, MD;  Location: [facility] CATH LAB;  Service: Cardiovascular;  Laterality: N/A;   LEFT AND RIGHT HEART CATHETERIZATION WITH CORONARY ANGIOGRAM N/A 12/17/2012   Procedure: LEFT AND RIGHT HEART CATHETERIZATION WITH CORONARY ANGIOGRAM;  Surgeon: Sherren Mocha, MD;  Location: [facility] CATH LAB;  Service: Cardiovascular;  Laterality: N/A;   SCLEROTHERAPY  01/22/2019   Procedure: SCLEROTHERAPY;  Surgeon: Wilford Corner, MD;  Location: [facility] ENDOSCOPY;  Service: Endoscopy;;     OB History   No obstetric history on file.      Home Medications    Prior to Admission medications   Medication Sig Start Date End Date Taking? Authorizing Provider  amiodarone (PACERONE) 200 MG tablet Take 1 tablet (200 mg total) by mouth daily. 02/15/19   [name], PA-C  apixaban (ELIQUIS) 5 MG TABS tablet Take 1 tablet (5 mg total) by mouth 2 (two) times daily. 01/12/19 03/27/19  Guilford Shi, MD  atorvastatin (LIPITOR) 40 MG tablet Take 1 tablet (40 mg total) by mouth daily at 6 PM. 07/05/18   Deboraha Sprang, MD  carvedilol (COREG) 3.125 MG tablet Take 1 tablet (3.125 mg total) by mouth 2 (two) times daily with a meal. 09/12/18   Deboraha Sprang, MD  Cholecalciferol (VITAMIN D-3) 25 MCG (1000 UT) CAPS Take 1,000 Units by mouth at bedtime.     [provider]  Ensure (ENSURE) Take 237 mLs by mouth daily.    [provider]  ferrous gluconate (FERGON) 324 MG tablet Take 324 mg by mouth 2 (two) times daily.  07/05/18   [provider]  furosemide (LASIX) 80 MG tablet Take 1 tablet (80 mg total) by mouth daily. 07/05/18   Deboraha Sprang, MD  hydrALAZINE (APRESOLINE) 25 MG tablet Take 0.5 tablets (12.5 mg total) by mouth 3 (three) times daily. 01/12/19   Guilford Shi,  MD  isosorbide mononitrate (IMDUR) 30 MG 24 hr tablet Take 0.5 tablets (15 mg total) by mouth daily. 01/13/19   Guilford Shi, MD  levalbuterol (XOPENEX HFA) 45 MCG/ACT inhaler Inhale 1 puff into the lungs every 4 (four) hours as needed for wheezing. Patient not taking: Reported on 02/23/2019 01/12/19 01/12/20  Guilford Shi, MD  MAGNESIUM-OXIDE 400 (241.3 Mg) MG tablet TAKE 1 TABLET BY MOUTH ONCE DAILY Patient taking differently: Take 400 mg by mouth daily.  02/07/19   Deboraha Sprang, MD  metolazone (ZAROXOLYN) 2.5 MG tablet Take 1 tablet (2.5 mg total) by mouth daily as needed (leg swellings or shortness of breath or weight gain>5 pounds in 48 hours). 01/12/19   Guilford Shi, MD  mexiletine (MEXITIL) 200 MG capsule Take 1 capsule (200 mg total) by mouth 2 (two) times daily. 01/24/19   [name], PA-C  nitroGLYCERIN (NITROSTAT) 0.4 MG SL tablet Place 1 tablet (0.4 mg total) under the tongue every 5 (five) minutes as needed for chest pain. 01/12/19   Guilford Shi, MD  ondansetron (ZOFRAN ODT) 4 MG disintegrating tablet Take 1 tablet (4 mg total) by mouth every 8 (eight) hours as needed for nausea or vomiting. 02/23/19   Robinson, [place] N,  PA-C  pantoprazole (PROTONIX) 40 MG tablet Take 1 tablet (40 mg total) by mouth 2 (two) times daily before a meal. 06/18/17   Lavina Hamman, MD    Family History Family History  Problem Relation Age of Onset   Cancer Mother     Social History Social History   Tobacco Use   Smoking status: Former Smoker   Smokeless tobacco: Never Used  Substance Use Topics   Alcohol use: No   Drug use: No     Allergies   Strawberry extract   Review of Systems Review of Systems  Constitutional: Negative for fever.  Respiratory: Negative for shortness of breath.   Cardiovascular: Negative for chest pain.  Gastrointestinal: Positive for abdominal pain, nausea and vomiting. Negative for constipation and diarrhea.  Genitourinary: Positive  for hematuria.  Neurological: Negative for headaches.  All other systems reviewed and are negative.    Physical Exam Updated Vital Signs BP 133/62    Pulse 60    Temp 98 F (36.7 C) (Oral)    Resp 11    Ht 1.6 m (5\' 3" )    Wt 63.5 kg    SpO2 95%    BMI 24.80 kg/m   Physical Exam Vitals signs and nursing note reviewed.  Constitutional:      Appearance: She is well-developed.     Comments: Chronically ill-appearing but nontoxic  HENT:     Head: Normocephalic and atraumatic.     Mouth/Throat:     Comments: Mucous membranes dry Eyes:     Pupils: Pupils are equal, round, and reactive to light.  Neck:     Musculoskeletal: Neck supple.  Cardiovascular:     Rate and Rhythm: Normal rate and regular rhythm.     Heart sounds: Normal heart sounds.  Pulmonary:     Effort: Pulmonary effort is normal. No respiratory distress.     Breath sounds: No wheezing.  Abdominal:     General: Bowel sounds are normal.     Palpations: Abdomen is soft.     Tenderness: There is abdominal tenderness in the right upper quadrant and epigastric area. There is no guarding or rebound. Negative signs include Murphy's sign.     Hernia: No hernia is present.  Skin:    General: Skin is warm and dry.  Neurological:     Mental Status: She is alert and oriented to person, place, and time.  Psychiatric:        Mood and Affect: Mood normal.      ED Treatments / Results  Labs (all labs ordered are listed, but only abnormal results are displayed) Labs Reviewed  COMPREHENSIVE METABOLIC PANEL - Abnormal; Notable for the following components:      Result Value   Potassium 2.9 (*)    Chloride 97 (*)    Glucose, Bld 115 (*)    BUN 60 (*)    Creatinine, Ser 2.94 (*)    Total Protein 8.5 (*)    AST 147 (*)    ALT 75 (*)    Total Bilirubin 1.3 (*)    GFR calc non Af Amer 15 (*)    GFR calc Af Amer 17 (*)    All other components within normal limits  CBC - Abnormal; Notable for the following components:    RDW 22.0 (*)    All other components within normal limits  URINALYSIS, ROUTINE W REFLEX MICROSCOPIC - Abnormal; Notable for the following components:   Color, Urine AMBER (*)  APPearance HAZY (*)    Hgb urine dipstick LARGE (*)    Protein, ur 30 (*)    RBC / HPF >50 (*)    Crystals PRESENT (*)    All other components within normal limits  LIPASE, BLOOD    EKG None  Radiology Ct Renal Stone Study  Result Date: 03/17/2019 CLINICAL DATA:  Hematuria EXAM: CT ABDOMEN AND PELVIS WITHOUT CONTRAST TECHNIQUE: Multidetector CT imaging of the abdomen and pelvis was performed following the standard protocol without IV contrast. COMPARISON:  01/28/2019 FINDINGS: LOWER CHEST: Moderate cardiomegaly HEPATOBILIARY: Mildly nodular hepatic contours with relative hypertrophy of the caudate and left hepatic lobe, consistent with hepatic cirrhosis. No focal liver lesion. No biliary dilatation. Hyperdensity in the gallbladder may be vicarious excretion of contrast related to a prior study. PANCREAS: The pancreatic parenchymal contours are normal and there is no ductal dilatation. There is no peripancreatic fluid collection. SPLEEN: Normal. ADRENALS/URINARY TRACT: --Adrenal glands: Unchanged right adrenal myelolipoma --Right kidney/ureter: Single nonobstructing renal calculus measuring 6 mm. No hydronephrosis, perinephric stranding or solid renal mass. --Left kidney/ureter: Single nonobstructing renal calculus measuring 3 mm. No hydronephrosis, perinephric stranding or solid renal mass. --Urinary bladder: Normal for degree of distention STOMACH/BOWEL: --Stomach/Duodenum: Mild wall thickening at the gastric fundus. Normal duodenal course. --Small bowel: No dilatation or inflammation. --Colon: Extensive diverticulosis without acute inflammation. --Appendix: Not visualized. No right lower quadrant inflammation or free fluid. VASCULAR/LYMPHATIC: There is aortic atherosclerosis without hemodynamically significant stenosis.  No abdominal or pelvic lymphadenopathy. REPRODUCTIVE: Small calcified uterine fibroid. No adnexal mass. MUSCULOSKELETAL. Grade 1 anterolisthesis at L4-5. OTHER: None. IMPRESSION: 1. No obstructive uropathy or other acute abdominal or pelvic abnormality. 2. Nonobstructive bilateral nephrolithiasis. 3. Hepatic cirrhosis. 4. Aortic Atherosclerosis (ICD10-I70.0). Electronically Signed   By: Ulyses Jarred M.D.   On: 03/17/2019 06:02    Procedures Procedures (including critical care time)  Medications Ordered in ED Medications  potassium chloride SA (K-DUR) CR tablet 40 mEq (has no administration in time range)  magnesium sulfate IVPB 2 g 50 mL (has no administration in time range)  ondansetron (ZOFRAN-ODT) disintegrating tablet 4 mg (has no administration in time range)  alum & mag hydroxide-simeth (MAALOX/MYLANTA) 200-200-20 MG/5ML suspension 15 mL (has no administration in time range)  sodium chloride 0.9 % bolus 500 mL (500 mLs Intravenous New Bag/Given 03/17/19 0426)     Initial Impression / Assessment and Plan / ED Course  I have reviewed the triage vital signs and the nursing notes.  Pertinent labs & imaging results that were available during my care of the patient were reviewed by me and considered in my medical decision making (see chart for details).        Patient presents with abdominal pain.  She is chronically ill-appearing but nontoxic and vital signs are reassuring.  Lab work reviewed from triage.  She has slightly low potassium at 2.9.  Creatinine is trending slightly upwards at 2.94.  She has had a recent evaluation twice for abdominal pain with concerns for constipation.  She had 2 CT scans in August and an ultrasound.  She was ultimately treated for constipation.  She denies constipation at this time.  Her pain is generalized but mostly in the right upper quadrant and the epigastric region.  Urinalysis does show hematuria and some crystals.  Question whether this could be kidney  stones although her prior CT scans have not indicated such.  Additionally, gastritis, gastroenteritis, cholecystitis, pancreatitis are all considerations.  CT scan was obtained and does not show  any evidence of stones or other abnormality.  Her most recent right upper quadrant ultrasound did show gallbladder wall thickening but no obvious cholecystitis.  Her LFTs are stable to slightly uptrending.  Will repeat ultrasound as well given that she is having persistent pain and tenderness on exam.  Patient was given Zofran, potassium, and Maalox.    Final Clinical Impressions(s) / ED Diagnoses   Final diagnoses:  Epigastric pain    ED Discharge Orders    None       Merryl Hacker, MD 03/17/19 [phone]

## 2019-03-18 DIAGNOSIS — E860 Dehydration: Secondary | ICD-10-CM

## 2019-03-18 DIAGNOSIS — I428 Other cardiomyopathies: Secondary | ICD-10-CM

## 2019-03-18 DIAGNOSIS — N200 Calculus of kidney: Secondary | ICD-10-CM

## 2019-03-18 DIAGNOSIS — E278 Other specified disorders of adrenal gland: Secondary | ICD-10-CM

## 2019-03-18 LAB — PROTIME-INR
INR: 1.7 — ABNORMAL HIGH (ref 0.8–1.2)
Prothrombin Time: 19.6 seconds — ABNORMAL HIGH (ref 11.4–15.2)

## 2019-03-18 LAB — COMPREHENSIVE METABOLIC PANEL
ALT: 72 U/L — ABNORMAL HIGH (ref 0–44)
AST: 132 U/L — ABNORMAL HIGH (ref 15–41)
Albumin: 2.9 g/dL — ABNORMAL LOW (ref 3.5–5.0)
Alkaline Phosphatase: 70 U/L (ref 38–126)
Anion gap: 9 (ref 5–15)
BUN: 48 mg/dL — ABNORMAL HIGH (ref 8–23)
CO2: 26 mmol/L (ref 22–32)
Calcium: 8.6 mg/dL — ABNORMAL LOW (ref 8.9–10.3)
Chloride: 102 mmol/L (ref 98–111)
Creatinine, Ser: 2.19 mg/dL — ABNORMAL HIGH (ref 0.44–1.00)
GFR calc Af Amer: 25 mL/min — ABNORMAL LOW (ref >60)
GFR calc non Af Amer: 21 mL/min — ABNORMAL LOW (ref >60)
Glucose, Bld: 96 mg/dL (ref 70–99)
Potassium: 3.5 mmol/L (ref 3.5–5.1)
Sodium: 137 mmol/L (ref 135–145)
Total Bilirubin: 0.9 mg/dL (ref 0.3–1.2)
Total Protein: 6.9 g/dL (ref 6.5–8.1)

## 2019-03-18 LAB — IRON AND TIBC
Iron: 160 ug/dL (ref 28–170)
Saturation Ratios: 42 % — ABNORMAL HIGH (ref 10.4–31.8)
TIBC: 382 ug/dL (ref 250–450)
UIBC: 222 ug/dL

## 2019-03-18 LAB — CBC
HCT: 36.1 % (ref 36.0–46.0)
Hemoglobin: 11.5 g/dL — ABNORMAL LOW (ref 12.0–15.0)
MCH: 26.7 pg (ref 26.0–34.0)
MCHC: 31.9 g/dL (ref 30.0–36.0)
MCV: 83.8 fL (ref 80.0–100.0)
Platelets: 211 10*3/uL (ref 150–400)
RBC: 4.31 MIL/uL (ref 3.87–5.11)
RDW: 21.5 % — ABNORMAL HIGH (ref 11.5–15.5)
WBC: 4.8 10*3/uL (ref 4.0–10.5)
nRBC: 0 % (ref 0.0–0.2)

## 2019-03-18 LAB — RETICULOCYTES
Immature Retic Fract: 4.6 % (ref 2.3–15.9)
RBC.: 4.47 MIL/uL (ref 3.87–5.11)
Retic Count, Absolute: 49.2 10*3/uL (ref 19.0–186.0)
Retic Ct Pct: 1.1 % (ref 0.4–3.1)

## 2019-03-18 LAB — APTT
aPTT: 48 seconds — ABNORMAL HIGH (ref 24–36)
aPTT: 83 seconds — ABNORMAL HIGH (ref 24–36)

## 2019-03-18 LAB — HEPARIN LEVEL (UNFRACTIONATED): Heparin Unfractionated: 1.62 IU/mL — ABNORMAL HIGH (ref 0.30–0.70)

## 2019-03-18 LAB — MAGNESIUM: Magnesium: 2.7 mg/dL — ABNORMAL HIGH (ref 1.7–2.4)

## 2019-03-18 LAB — FOLATE: Folate: 15.3 ng/mL (ref >5.9)

## 2019-03-18 LAB — FERRITIN: Ferritin: 49 ng/mL (ref 11–307)

## 2019-03-18 MED ORDER — POTASSIUM CHLORIDE CRYS ER 20 MEQ PO TBCR
40.0000 meq | EXTENDED_RELEASE_TABLET | Freq: Once | ORAL | Status: AC
  Administered 2019-03-18: 40 meq via ORAL
  Filled 2019-03-18: qty 2

## 2019-03-18 MED ORDER — MEXILETINE HCL 200 MG PO CAPS
200.0000 mg | ORAL_CAPSULE | Freq: Two times a day (BID) | ORAL | Status: DC
  Administered 2019-03-18 – 2019-03-21 (×7): 200 mg via ORAL
  Filled 2019-03-18 (×8): qty 1

## 2019-03-18 MED ORDER — HEPARIN (PORCINE) 25000 UT/250ML-% IV SOLN
800.0000 [IU]/h | INTRAVENOUS | Status: DC
  Administered 2019-03-18: 12:00:00 800 [IU]/h via INTRAVENOUS
  Filled 2019-03-18: qty 250

## 2019-03-18 MED ORDER — ONDANSETRON HCL 4 MG/2ML IJ SOLN
4.0000 mg | Freq: Once | INTRAMUSCULAR | Status: AC
  Administered 2019-03-18: 05:00:00 4 mg via INTRAVENOUS
  Filled 2019-03-18: qty 2

## 2019-03-18 MED ORDER — PEG 3350-KCL-NA BICARB-NACL 420 G PO SOLR
4000.0000 mL | Freq: Once | ORAL | Status: AC
  Administered 2019-03-18: 4000 mL via ORAL
  Filled 2019-03-18 (×2): qty 4000

## 2019-03-18 MED ORDER — CARVEDILOL 3.125 MG PO TABS
3.1250 mg | ORAL_TABLET | Freq: Two times a day (BID) | ORAL | Status: DC
  Administered 2019-03-18 – 2019-03-21 (×6): 3.125 mg via ORAL
  Filled 2019-03-18 (×7): qty 1

## 2019-03-18 MED ORDER — AMIODARONE HCL 200 MG PO TABS
200.0000 mg | ORAL_TABLET | Freq: Every day | ORAL | Status: DC
  Administered 2019-03-18 – 2019-03-21 (×4): 200 mg via ORAL
  Filled 2019-03-18 (×4): qty 1

## 2019-03-18 MED ORDER — ONDANSETRON HCL 4 MG/2ML IJ SOLN
4.0000 mg | Freq: Four times a day (QID) | INTRAMUSCULAR | Status: DC | PRN
  Administered 2019-03-18 – 2019-03-21 (×6): 4 mg via INTRAVENOUS
  Filled 2019-03-18 (×6): qty 2

## 2019-03-18 NOTE — Progress Notes (Signed)
ANTICOAGULATION CONSULT NOTE - Initial Consult  Pharmacy Consult for IV heparin Indication: atrial fibrillation  Allergies  Allergen Reactions  . Strawberry Extract Hives, Itching and Swelling         Patient Measurements: Height: 5\' 3"  (160 cm) Weight: 125 lb 9.6 oz (57 kg) IBW/kg (Calculated) : 52.4 Heparin Dosing Weight: 57 kg  Vital Signs: Temp: 97.5 F (36.4 C) (09/21 0829) Temp Source: Oral (09/21 0829) BP: 102/59 (09/21 0829) Pulse Rate: 60 (09/21 0829)  Labs: Recent Labs    03/16/19 2049 03/18/19 0601  HGB 12.8 11.5*  HCT 39.9 36.1  PLT 276 211  LABPROT  --  19.6*  INR  --  1.7*  CREATININE 2.94* 2.19*    Estimated Creatinine Clearance: 18.4 mL/min (A) (by C-G formula based on SCr of 2.19 mg/dL (H)).   Medical History: Past Medical History:  Diagnosis Date  . Chronic anemia   . Chronic systolic CHF (congestive heart failure) ([place])   . CKD (chronic kidney disease), stage IV ([place])   . Former tobacco use   . History of tobacco abuse   . Hyperlipidemia   . Hypertension   . Mitral regurgitation   . Nonischemic cardiomyopathy ([place])    a. EF 10-15% on 11/2012 cath with no significant CAD. b. EF 25% in 2019. b. EF 15% in 12/2018  . PAF (paroxysmal atrial fibrillation) ([place])   . Protein calorie malnutrition ([place])   . Quit consuming alcohol in remote past   . Sinus bradycardia 12/21/2012  . Ventricular tachycardia ([place]) 12/14/2012    Medications:  Infusions:    Assessment: 73 yo female with chronic afib on Eliquis.  Admitted with abdominal pain, planning EGD/colonoscopy tomorrow.  Eliquis was held on admission.  Pharmacy asked to initiate IV heparin today.  CBC, platelet count stable.  Goal of Therapy:  Heparin level 0.3-0.7 units/ml Monitor platelets by anticoagulation protocol: Yes   Plan:  Start IV heparin 800 units/hr. Check heparin level in 8 hrs Daily heparin level and CBC. Per MD orders - will hold heparin at 5 AM tomorrow (9/22) before GI  procedures.  Marguerite Olea, [facility] Clinical Pharmacist Phone [phone]  03/18/2019 10:39 AM

## 2019-03-18 NOTE — Progress Notes (Signed)
Bowel prep given to pt. Pt educated to complete one half to 3/4 today and the rest of the bowel prep to be completed at 5 AM. Will pass onto night shift.

## 2019-03-18 NOTE — Progress Notes (Signed)
[facility] for IV heparin Indication: atrial fibrillation  Allergies  Allergen Reactions  . Strawberry Extract Hives, Itching and Swelling         Patient Measurements: Height: 5\' 3"  (160 cm) Weight: 125 lb 9.6 oz (57 kg) IBW/kg (Calculated) : 52.4 Heparin Dosing Weight: 57 kg  Vital Signs: Temp: 97.5 F (36.4 C) (09/21 2002) Temp Source: Oral (09/21 2002) BP: 123/78 (09/21 2002) Pulse Rate: 60 (09/21 2002)  Labs: Recent Labs    03/16/19 2049 03/18/19 0601 03/18/19 1105 03/18/19 2127  HGB 12.8 11.5*  --   --   HCT 39.9 36.1  --   --   PLT 276 211  --   --   APTT  --   --  48* 83*  LABPROT  --  19.6*  --   --   INR  --  1.7*  --   --   HEPARINUNFRC  --   --  1.62*  --   CREATININE 2.94* 2.19*  --   --     Estimated Creatinine Clearance: 18.4 mL/min (A) (by C-G formula based on SCr of 2.19 mg/dL (H)).   Medical History: Past Medical History:  Diagnosis Date  . Chronic anemia   . Chronic systolic CHF (congestive heart failure) ([place])   . CKD (chronic kidney disease), stage IV ([place])   . Former tobacco use   . History of tobacco abuse   . Hyperlipidemia   . Hypertension   . Mitral regurgitation   . Nonischemic cardiomyopathy ([place])    a. EF 10-15% on 11/2012 cath with no significant CAD. b. EF 25% in 2019. b. EF 15% in 12/2018  . PAF (paroxysmal atrial fibrillation) ([place])   . Protein calorie malnutrition ([place])   . Quit consuming alcohol in remote past   . Sinus bradycardia 12/21/2012  . Ventricular tachycardia ([place]) 12/14/2012    Medications:  Infusions:  . heparin 800 Units/hr (03/18/19 1214)    Assessment: 73 yo female with chronic afib on Eliquis.  Admitted with abdominal pain, planning EGD/colonoscopy tomorrow.  Eliquis was held on admission.  Pharmacy asked to initiate IV heparin today.  CBC, platelet count stable.  Aptt at goal this evening. No bleeding issues noted.   Goal of Therapy:  Heparin level 0.3-0.7  units/ml Monitor platelets by anticoagulation protocol: Yes   Plan:  Continue IV heparin 800 units/hr. Per MD orders - will hold heparin at 5 AM tomorrow (9/22) before GI procedures.  Erin Hearing PharmD., BCPS Clinical Pharmacist 03/18/2019 10:17 PM

## 2019-03-18 NOTE — Anesthesia Preprocedure Evaluation (Addendum)
Anesthesia Evaluation  Patient identified by MRN, date of birth, ID band Patient awake    Reviewed: Allergy & Precautions, NPO status , Patient's Chart, lab work & pertinent test results  Airway Mallampati: II  TM Distance: >3 FB Neck ROM: Full    Dental no notable dental hx. (+) Edentulous Upper, Edentulous Lower   Pulmonary former smoker,    Pulmonary exam normal breath sounds clear to auscultation       Cardiovascular Exercise Tolerance: Good hypertension, Pt. on medications and Pt. on home beta blockers +CHF  Normal cardiovascular exam+ dysrhythmias Atrial Fibrillation + Cardiac Defibrillator  Rhythm:Regular Rate:Normal  01/08/19 Echo  EF 15%   Neuro/Psych negative neurological ROS  negative psych ROS   GI/Hepatic negative GI ROS, Neg liver ROS, GERD  ,  Endo/Other  negative endocrine ROS  Renal/GU CRFRenal diseaseCr 1.81 K+ 4.6     Musculoskeletal negative musculoskeletal ROS (+)   Abdominal   Peds  Hematology  (+) anemia , Hgb 11.5   Anesthesia Other Findings   Reproductive/Obstetrics                           Anesthesia Physical Anesthesia Plan  ASA: IV  Anesthesia Plan: MAC   Post-op Pain Management:    Induction: Intravenous  PONV Risk Score and Plan: Treatment may vary due to age or medical condition  Airway Management Planned: Natural Airway and Nasal Cannula  Additional Equipment: None  Intra-op Plan:   Post-operative Plan:   Informed Consent: I have reviewed the patients History and Physical, chart, labs and discussed the procedure including the risks, benefits and alternatives for the proposed anesthesia with the patient or authorized representative who has indicated his/her understanding and acceptance.     Dental advisory given  Plan Discussed with: CRNA and Anesthesiologist  Anesthesia Plan Comments:        Anesthesia Quick Evaluation

## 2019-03-18 NOTE — Consult Note (Addendum)
[facility] Surgery Consult Note  Stephanie Yates 1946-06-27  [id number].    Requesting MD: Wynelle Cleveland Chief Complaint/Reason for Consult: possible gallbladder mass  HPI:  Patient is a 73 year old female who presented to [facility] yesterday with abdominal pain. She tells me pain has been over the last 2 weeks. Pain located in epigastrium and radiates to RUQ. Worse with eating. Associated nausea and vomiting, non-bloody, non-bilious from description. Patient does not notice that specific foods make it any worse, just that she has pain shortly after eating. She has had prior EGD 01/22/19 with Dr. Michail Sermon which showed inflammation of antrum. Patient denies fever, chills, chest pain, SOB, urinary symptoms to me this AM. She reports that she does not always have regular BMs, but she is passing flatus. PMH significant for CHF with EF of 15% s/p AICD, HTN, CKD stage IV, Cirrhosis, chronic anemia. Patient on eliquis, last dose Saturday. No past abdominal surgery.   ROS: Review of Systems  Constitutional: Negative for chills and fever.  Respiratory: Negative for cough, shortness of breath and wheezing.   Cardiovascular: Negative for chest pain and palpitations.  Gastrointestinal: Positive for abdominal pain, nausea and vomiting. Negative for blood in stool, constipation, diarrhea and melena.  Genitourinary: Negative for dysuria, frequency and urgency.  All other systems reviewed and are negative.   Family History  Problem Relation Age of Onset  . Cancer Mother     Past Medical History:  Diagnosis Date  . Chronic anemia   . Chronic systolic CHF (congestive heart failure) ([place])   . CKD (chronic kidney disease), stage IV ([place])   . Former tobacco use   . History of tobacco abuse   . Hyperlipidemia   . Hypertension   . Mitral regurgitation   . Nonischemic cardiomyopathy ([place])    a. EF 10-15% on 11/2012 cath with no significant CAD. b. EF 25% in 2019. b. EF 15% in 12/2018  . PAF (paroxysmal atrial  fibrillation) ([place])   . Protein calorie malnutrition ([place])   . Quit consuming alcohol in remote past   . Sinus bradycardia 12/21/2012  . Ventricular tachycardia ([place]) 12/14/2012    Past Surgical History:  Procedure Laterality Date  . BIOPSY  01/22/2019   Procedure: BIOPSY;  Surgeon: Wilford Corner, MD;  Location: [place];  Service: Endoscopy;;  . ESOPHAGOGASTRODUODENOSCOPY (EGD) WITH PROPOFOL N/A 06/18/2017   Procedure: ESOPHAGOGASTRODUODENOSCOPY (EGD) WITH PROPOFOL;  Surgeon: Ronnette Juniper, MD;  Location: [place];  Service: Gastroenterology;  Laterality: N/A;  . ESOPHAGOGASTRODUODENOSCOPY (EGD) WITH PROPOFOL N/A 01/22/2019   Procedure: ESOPHAGOGASTRODUODENOSCOPY (EGD) WITH PROPOFOL;  Surgeon: Wilford Corner, MD;  Location: [place];  Service: Endoscopy;  Laterality: N/A;  . IMPLANTABLE CARDIOVERTER DEFIBRILLATOR IMPLANT  12/19/2012   St. Jude dual-chamber ICD, serial number [id number]  . IMPLANTABLE CARDIOVERTER DEFIBRILLATOR IMPLANT N/A 12/19/2012   Procedure: IMPLANTABLE CARDIOVERTER DEFIBRILLATOR IMPLANT;  Surgeon: Deboraha Sprang, MD;  Location: [facility] CATH LAB;  Service: Cardiovascular;  Laterality: N/A;  . LEFT AND RIGHT HEART CATHETERIZATION WITH CORONARY ANGIOGRAM N/A 12/17/2012   Procedure: LEFT AND RIGHT HEART CATHETERIZATION WITH CORONARY ANGIOGRAM;  Surgeon: Sherren Mocha, MD;  Location: [facility] CATH LAB;  Service: Cardiovascular;  Laterality: N/A;  . SCLEROTHERAPY  01/22/2019   Procedure: SCLEROTHERAPY;  Surgeon: Wilford Corner, MD;  Location: [facility] ENDOSCOPY;  Service: Endoscopy;;    Social History:  reports that she has quit smoking. She has never used smokeless tobacco. She reports that she does not drink alcohol or use drugs.  Allergies:  Allergies  Allergen Reactions  . Strawberry Extract Hives, Itching and Swelling         Medications Prior to Admission  Medication Sig Dispense Refill  . amiodarone (PACERONE) 200 MG tablet Take 1 tablet (200 mg total) by mouth  daily. 30 tablet 6  . apixaban (ELIQUIS) 5 MG TABS tablet Take 1 tablet (5 mg total) by mouth 2 (two) times daily. 60 tablet 2  . atorvastatin (LIPITOR) 40 MG tablet Take 1 tablet (40 mg total) by mouth daily at 6 PM. 30 tablet 0  . carvedilol (COREG) 3.125 MG tablet Take 1 tablet (3.125 mg total) by mouth 2 (two) times daily with a meal. 180 tablet 2  . Cholecalciferol (VITAMIN D-3) 25 MCG (1000 UT) CAPS Take 1,000 Units by mouth at bedtime.     . Ensure (ENSURE) Take 237 mLs by mouth daily.    . ferrous gluconate (FERGON) 324 MG tablet Take 324 mg by mouth 2 (two) times daily.     . furosemide (LASIX) 80 MG tablet Take 1 tablet (80 mg total) by mouth daily. 90 tablet 2  . hydrALAZINE (APRESOLINE) 25 MG tablet Take 0.5 tablets (12.5 mg total) by mouth 3 (three) times daily. 30 tablet 1  . isosorbide mononitrate (IMDUR) 30 MG 24 hr tablet Take 0.5 tablets (15 mg total) by mouth daily. 30 tablet 1  . MAGNESIUM-OXIDE 400 (241.3 Mg) MG tablet TAKE 1 TABLET BY MOUTH ONCE DAILY (Patient taking differently: Take 400 mg by mouth daily as needed (antacid). ) 90 tablet 3  . metolazone (ZAROXOLYN) 2.5 MG tablet Take 1 tablet (2.5 mg total) by mouth daily as needed (leg swellings or shortness of breath or weight gain>5 pounds in 48 hours). 30 tablet 1  . mexiletine (MEXITIL) 200 MG capsule Take 1 capsule (200 mg total) by mouth 2 (two) times daily. 60 capsule 6  . nitroGLYCERIN (NITROSTAT) 0.4 MG SL tablet Place 1 tablet (0.4 mg total) under the tongue every 5 (five) minutes as needed for chest pain. 30 tablet 12  . ondansetron (ZOFRAN ODT) 4 MG disintegrating tablet Take 1 tablet (4 mg total) by mouth every 8 (eight) hours as needed for nausea or vomiting. 20 tablet 0  . pantoprazole (PROTONIX) 40 MG tablet Take 1 tablet (40 mg total) by mouth 2 (two) times daily before a meal. 120 tablet 0  . levalbuterol (XOPENEX HFA) 45 MCG/ACT inhaler Inhale 1 puff into the lungs every 4 (four) hours as needed for  wheezing. (Patient not taking: Reported on 02/23/2019) 1 Inhaler 2    Blood pressure (!) 102/59, pulse 60, temperature (!) 97.5 F (36.4 C), temperature source Oral, resp. rate 17, height 5\' 3"  (1.6 m), weight 57 kg, SpO2 95 %. Physical Exam: Physical Exam Constitutional:      General: She is not in acute distress.    Appearance: She is well-developed and normal weight. She is not ill-appearing.  HENT:     Head: Normocephalic and atraumatic.     Mouth/Throat:     Mouth: Mucous membranes are moist.     Pharynx: Oropharynx is clear.  Eyes:     General: No scleral icterus.    Extraocular Movements: Extraocular movements intact.     Pupils: Pupils are equal, round, and reactive to light.  Cardiovascular:     Rate and Rhythm: Normal rate and regular rhythm.     Heart sounds: Normal heart sounds.  Pulmonary:     Effort: Pulmonary effort is normal.     Breath  sounds: Normal breath sounds.  Abdominal:     General: Bowel sounds are normal. There is no distension.     Palpations: Abdomen is soft. There is no hepatomegaly or splenomegaly.     Tenderness: There is no abdominal tenderness. There is no guarding or rebound. Negative signs include Murphy's sign.     Hernia: No hernia is present.  Skin:    General: Skin is warm and dry.  Neurological:     General: No focal deficit present.     Mental Status: She is alert and oriented to person, place, and time.  Psychiatric:        Mood and Affect: Mood normal.        Behavior: Behavior normal.     Results for orders placed or performed during the hospital encounter of 03/16/19 (from the past 48 hour(s))  Urinalysis, Routine w reflex microscopic     Status: Abnormal   Collection Time: 03/16/19  8:46 PM  Result Value Ref Range   Color, Urine AMBER (A) YELLOW    Comment: BIOCHEMICALS MAY BE AFFECTED BY COLOR   APPearance HAZY (A) CLEAR   Specific Gravity, Urine 1.009 1.005 - 1.030   pH 7.0 5.0 - 8.0   Glucose, UA NEGATIVE NEGATIVE mg/dL    Hgb urine dipstick LARGE (A) NEGATIVE   Bilirubin Urine NEGATIVE NEGATIVE   Ketones, ur NEGATIVE NEGATIVE mg/dL   Protein, ur 30 (A) NEGATIVE mg/dL   Nitrite NEGATIVE NEGATIVE   Leukocytes,Ua NEGATIVE NEGATIVE   RBC / HPF >50 (H) 0 - 5 RBC/hpf   WBC, UA 0-5 0 - 5 WBC/hpf   Bacteria, UA NONE SEEN NONE SEEN   Squamous Epithelial / LPF 0-5 0 - 5   Hyaline Casts, UA PRESENT    Crystals PRESENT (A) NEGATIVE    Comment: Performed at [facility], [address], [place], [place] [address]  Lipase, blood     Status: None   Collection Time: 03/16/19  8:49 PM  Result Value Ref Range   Lipase 38 11 - 51 U/L    Comment: Performed at [facility], [place] [address], [place], [place] [address]  Comprehensive metabolic panel     Status: Abnormal   Collection Time: 03/16/19  8:49 PM  Result Value Ref Range   Sodium 136 135 - 145 mmol/L   Potassium 2.9 (L) 3.5 - 5.1 mmol/L   Chloride 97 (L) 98 - 111 mmol/L   CO2 28 22 - 32 mmol/L   Glucose, Bld 115 (H) 70 - 99 mg/dL   BUN 60 (H) 8 - 23 mg/dL   Creatinine, Ser 2.94 (H) 0.44 - 1.00 mg/dL   Calcium 9.2 8.9 - 10.3 mg/dL   Total Protein 8.5 (H) 6.5 - 8.1 g/dL   Albumin 3.5 3.5 - 5.0 g/dL   AST 147 (H) 15 - 41 U/L   ALT 75 (H) 0 - 44 U/L   Alkaline Phosphatase 81 38 - 126 U/L   Total Bilirubin 1.3 (H) 0.3 - 1.2 mg/dL   GFR calc non Af Amer 15 (L) >60 mL/min   GFR calc Af Amer 17 (L) >60 mL/min   Anion gap 11 5 - 15    Comment: Performed at [facility], [place] [address], [place], [place] [address]  CBC     Status: Abnormal   Collection Time: 03/16/19  8:49 PM  Result Value Ref Range   WBC 6.2 4.0 - 10.5 K/uL   RBC 4.79  3.87 - 5.11 MIL/uL   Hemoglobin 12.8 12.0 - 15.0 g/dL   HCT 39.9 36.0 - 46.0 %   MCV 83.3 80.0 - 100.0 fL   MCH 26.7 26.0 - 34.0 pg   MCHC 32.1 30.0 - 36.0 g/dL   RDW 22.0 (H) 11.5 - 15.5 %   Platelets 276 150 - 400 K/uL   nRBC 0.0 0.0 - 0.2 %    Comment: Performed at [facility] [address] [place], [place] [address]  Magnesium     Status: Abnormal   Collection Time: 03/16/19  8:49 PM  Result Value Ref Range   Magnesium 2.9 (H) 1.7 - 2.4 mg/dL    Comment: Performed at [facility] [address], [place], [place] [address]  SARS CORONAVIRUS 2 (TAT 6-24 HRS) Nasopharyngeal Nasopharyngeal Swab     Status: None   Collection Time: 03/17/19 10:29 AM   Specimen: Nasopharyngeal Swab  Result Value Ref Range   SARS Coronavirus 2 NEGATIVE NEGATIVE    Comment: (NOTE) SARS-CoV-2 target nucleic acids are NOT DETECTED. The SARS-CoV-2 RNA is generally detectable in upper and lower respiratory specimens during the acute phase of infection. Negative results do not preclude SARS-CoV-2 infection, do not rule out co-infections with other pathogens, and should not be used as the sole basis for treatment or other patient management decisions. Negative results must be combined with clinical observations, patient history, and epidemiological information. The expected result is Negative. Fact Sheet for Patients: SugarRoll.be Fact Sheet for Healthcare Providers: https://www.woods-mathews.com/ This test is not yet approved or cleared by the [place] FDA and  has been authorized for detection and/or diagnosis of SARS-CoV-2 by FDA under an Emergency Use Authorization (EUA). This EUA will remain  in effect (meaning this test can be used) for the duration of the COVID-19 declaration under Section 56 4(b)(1) of the Act, 21 U.S.C. section 360bbb-3(b)(1), unless the authorization is terminated or revoked sooner. Performed at [facility], [place] [address], [place], [place] [address]   CBC     Status: Abnormal   Collection Time: 03/18/19  6:01 AM  Result Value Ref Range   WBC 4.8 4.0 - 10.5 K/uL   RBC 4.31 3.87 - 5.11 MIL/uL   Hemoglobin 11.5 (L) 12.0 - 15.0 g/dL   HCT 36.1 36.0 - 46.0 %   MCV 83.8 80.0 - 100.0 fL   MCH 26.7 26.0 - 34.0 pg    MCHC 31.9 30.0 - 36.0 g/dL   RDW 21.5 (H) 11.5 - 15.5 %   Platelets 211 150 - 400 K/uL   nRBC 0.0 0.0 - 0.2 %    Comment: Performed at [facility], [place] [address], [place], [place] [address]  Comprehensive metabolic panel     Status: Abnormal   Collection Time: 03/18/19  6:01 AM  Result Value Ref Range   Sodium 137 135 - 145 mmol/L   Potassium 3.5 3.5 - 5.1 mmol/L    Comment: DELTA CHECK NOTED   Chloride 102 98 - 111 mmol/L   CO2 26 22 - 32 mmol/L   Glucose, Bld 96 70 - 99 mg/dL   BUN 48 (H) 8 - 23 mg/dL   Creatinine, Ser 2.19 (H) 0.44 - 1.00 mg/dL   Calcium 8.6 (L) 8.9 - 10.3 mg/dL   Total Protein 6.9 6.5 - 8.1 g/dL   Albumin 2.9 (L) 3.5 - 5.0 g/dL   AST 132 (H) 15 - 41 U/L   ALT 72 (  H) 0 - 44 U/L   Alkaline Phosphatase 70 38 - 126 U/L   Total Bilirubin 0.9 0.3 - 1.2 mg/dL   GFR calc non Af Amer 21 (L) >60 mL/min   GFR calc Af Amer 25 (L) >60 mL/min   Anion gap 9 5 - 15    Comment: Performed at [facility] [address], [place], [place] [address]  Magnesium     Status: Abnormal   Collection Time: 03/18/19  6:01 AM  Result Value Ref Range   Magnesium 2.7 (H) 1.7 - 2.4 mg/dL    Comment: Performed at [facility] [address], [place], [place] [address]  Protime-INR     Status: Abnormal   Collection Time: 03/18/19  6:01 AM  Result Value Ref Range   Prothrombin Time 19.6 (H) 11.4 - 15.2 seconds   INR 1.7 (H) 0.8 - 1.2    Comment: (NOTE) INR goal varies based on device and disease states. Performed at [facility], [place] [address], [place], [place] [address]    Nm Hepatobiliary Liver Func  Result Date: 03/17/2019 CLINICAL DATA:  Epigastric abdominal pain over the last 2 weeks with intermittent nausea and vomiting with each meal. EXAM: NUCLEAR MEDICINE HEPATOBILIARY IMAGING TECHNIQUE: Sequential images of the abdomen were obtained out to 60 minutes following intravenous administration of radiopharmaceutical. RADIOPHARMACEUTICALS:  5.3 mCi  Tc-31m  Choletec IV COMPARISON:  None. FINDINGS: Prompt uptake and biliary excretion of activity by the liver is seen. Gallbladder activity is visualized, consistent with patency of cystic duct. Biliary activity passes into small bowel, consistent with patent common bile duct. IMPRESSION: Normal exam.  No evidence of cholecystitis. Electronically Signed   By: Franki Cabot M.D.   On: 03/17/2019 16:26   Ct Renal Stone Study  Result Date: 03/17/2019 CLINICAL DATA:  Hematuria EXAM: CT ABDOMEN AND PELVIS WITHOUT CONTRAST TECHNIQUE: Multidetector CT imaging of the abdomen and pelvis was performed following the standard protocol without IV contrast. COMPARISON:  01/28/2019 FINDINGS: LOWER CHEST: Moderate cardiomegaly HEPATOBILIARY: Mildly nodular hepatic contours with relative hypertrophy of the caudate and left hepatic lobe, consistent with hepatic cirrhosis. No focal liver lesion. No biliary dilatation. Hyperdensity in the gallbladder may be vicarious excretion of contrast related to a prior study. PANCREAS: The pancreatic parenchymal contours are normal and there is no ductal dilatation. There is no peripancreatic fluid collection. SPLEEN: Normal. ADRENALS/URINARY TRACT: --Adrenal glands: Unchanged right adrenal myelolipoma --Right kidney/ureter: Single nonobstructing renal calculus measuring 6 mm. No hydronephrosis, perinephric stranding or solid renal mass. --Left kidney/ureter: Single nonobstructing renal calculus measuring 3 mm. No hydronephrosis, perinephric stranding or solid renal mass. --Urinary bladder: Normal for degree of distention STOMACH/BOWEL: --Stomach/Duodenum: Mild wall thickening at the gastric fundus. Normal duodenal course. --Small bowel: No dilatation or inflammation. --Colon: Extensive diverticulosis without acute inflammation. --Appendix: Not visualized. No right lower quadrant inflammation or free fluid. VASCULAR/LYMPHATIC: There is aortic atherosclerosis without hemodynamically significant  stenosis. No abdominal or pelvic lymphadenopathy. REPRODUCTIVE: Small calcified uterine fibroid. No adnexal mass. MUSCULOSKELETAL. Grade 1 anterolisthesis at L4-5. OTHER: None. IMPRESSION: 1. No obstructive uropathy or other acute abdominal or pelvic abnormality. 2. Nonobstructive bilateral nephrolithiasis. 3. Hepatic cirrhosis. 4. Aortic Atherosclerosis (ICD10-I70.0). Electronically Signed   By: Ulyses Jarred M.D.   On: 03/17/2019 06:02   US Abdomen Limited Ruq  Result Date: 03/17/2019 CLINICAL DATA:  Epigastric pain for 2 weeks, nausea and vomiting. EXAM: ULTRASOUND ABDOMEN LIMITED RIGHT UPPER QUADRANT COMPARISON:  None. FINDINGS: Gallbladder: Gallbladder walls appear  thickened, with a demonstrated measurement of 7 mm. Heterogeneous masslike structure at the gallbladder fundus measures 2.6 cm greatest dimension. No gallstones seen. No sonographic Murphy's sign elicited during the exam. Common bile duct: Diameter: 5 mm Liver: Liver echotexture appears coarsened and the peripheral liver contours appear slightly nodular suggesting underlying cirrhosis. No focal liver abnormality identified. Portal vein is patent on color Doppler imaging with normal direction of blood flow towards the liver. Other: None. IMPRESSION: 1. Gallbladder walls are thickened. Gallbladder wall thickening is nonspecific with differential considerations of cholecystitis, CHF, hypoproteinemia and reactive thickening secondary to adjacent liver disease. Given the cirrhotic appearing liver, I suspect this gallbladder wall thickening is reactive to adjacent liver disease. Given the epigastric pain, would consider nuclear medicine HIDA scan to exclude the less likely possibility of cholecystitis. 2. Heterogeneous masslike structure demonstrated at the gallbladder fundus, measuring 2.6 cm greatest dimension. Based on today's earlier CT, I suspect that this is abutting colon with heterogeneous appearance secondary to underlying diverticulosis. At  minimum, recommend follow-up gallbladder ultrasound in 3 months to ensure stability or resolution and thereby ensure benignity. If persistent or increased on subsequent ultrasound, would then recommend MRI for definitive characterization. 3. Cirrhotic appearing liver. Electronically Signed   By: Franki Cabot M.D.   On: 03/17/2019 08:30      Assessment/Plan CHF - last EF on recent echo 15%, s/p AICD HTN CKD stage IV Cirrhosis - ?new diagnosis Paroxysmal atrial fibrillation on eliquis - last dose Saturday Chronic anemia  Abdominal pain Possible gallbladder mass - CT with no mention of gallbladder issue, but hepatic cirrhosis - [place] with gallbladder wall thickening which is nonspecific in cirrhotic patient, and heterogenous mass like structure in gallbladder fundus - radiologist felt that this may actually be colon based on earlier CT and recommended repeat US in 3 months - HIDA negative for cholecystitis - patient unable to get MRI with AICD - LFTs mildly elevated and Tbili WNL this AM, could be elevated secondary to cirrhosis - GI following and planning EGD and colonoscopy tomorrow - could consider EUS  - I agree with radiology recommendation for follow up US in 3 months - patient overall is not a good surgical candidate either with EF of 15% and cirrhosis  Brigid Re, [facility] Surgery 03/18/2019, 10:33 AM Pager: [place]: [phone]

## 2019-03-18 NOTE — Progress Notes (Addendum)
PROGRESS NOTE    Stephanie Yates   MIW:[id number]  DOB: 1946-03-18  DOA: 03/16/2019 PCP: Josetta Huddle, MD   Brief Narrative:  Stephanie Yates is a 73 year old female with nonischemic cardiomyopathy and an EF 15% status post ICD, paroxysmal A. fib on Eliquis, cirrhosis, CKD stage IV, hypertension, hyperlipidemia who presented to the hospital with epigastric abdominal pain per the HPI.  The patient's daughter noted that the patient has been eating lots of fried/greasy foods, going to [employer], and drinking sodas.  Prior notes mention that she has chronic abdominal pain..  She  had an EGD 12/2018 revealing:  Segmental mild mucosal changes characterized by linear areas of desquamation were found in the entire Esophagus, Segmental severe mucosal changes characterized by congestion, erythema, erosion, inflammation and nodularity were found in the gastric antrum. Biopsies were negative for H. pylori.    In the ED: Potassium 2.9, creatinine 2.94 increased from 2.0, noted to have hematuria CT renal stone study in the ED revealed the patient to have cirrhotic liver which is a new diagnosis for her.Hyperdensity in the gallbladder also noted. Ultrasound of the right upper quadrant revealed again a cirrhotic liver, thickened gallbladder wall measuring about 7 mm with and shown by the radiologist that "thickening is reactive to adjacent liver disease". GI consult was performed and a HIDA scan was ordered and performed yesterday and is found to be negative.  She was also noted to have hematuria.  The CT renal stone study showed nonobstructive bilateral nephrolithiasis.  Subjective: She vomited clear substance this morning before she had her clear liquid breakfast.  She continues to have epigastric abdominal pain and needed a dose of morphine last night.    Assessment & Plan:   Principal Problem:   Epigastric abdominal pain, nausea and vomiting -This is acute on chronic pain-see above details -GI plans to  do an EGD tomorrow-Dr. Therisa Doyne will also like to do a colonoscopy as she is due for it - Continue IV antiemetics and IV pain control  Active Problems:  Gallbladder mass (has been present at least 2 years) with steady rise in LFTs this year -Due to acute rise in LFTs and new GI complaints, GI would like general surgery to evaluate this mass to decide if the patient needs a cholecystectomy-  this would be extremely complicated as her EF is 15% and she has a history of V. Tach last episode being as recent as last month -I have consulted general surgery today on behalf of GI today   Dehydration with mild renal insufficiency CKD 4 - Her BUN and creatinine rose to 60 and 2.94  -baseline is around 2.1-her diuretics were held and she was hydrated with IV fluids - Her creatinine has improved to 2.1-I have held the IV fluids and as she is still vomiting-we will hold diuretics as well  Hypokalemia-history of V. tach -Potassium has improved with replacement to 3.5 - Magnesium is adequate at 2.7 -I will give her 2 more doses of potassium today as she will be having a bowel prep today resulting in diarrhea-we will check potassium and magnesium tomorrow morning    PAF (paroxysmal atrial fibrillation) CHA2DS2-VASc Score- 4 -Continue amiodarone-Eliquis is on hold- I will start a heparin infusion for A. fib and also to prevent an LV thrombus due to her low EF  Cirrhosis of the liver -I am not sure of the etiology of this-secondary to congestive heart failure?  Severe nonischemic cardiomyopathy with an EF of 15% V. tach-ICD (implantable  cardioverter-defibrillator), dual, st Judes -Continue amiodarone, mexiletine, carvedilol -Closely monitor and replace electrolytes  Normocytic anemia-previously documented as anemia of chronic disease -Hemoglobin has been between 11-13 range -Her last anemia panel was in 2018 - She is on iron replacement-we will recheck this as her ferritin was quite low at 15 and  2018  Hematuria/nephrolithiasis -Even though her stones are nonobstructing, I suspect that being on Eliquis is what is causing her hematuria   Time spent in minutes: 45-extensive review of prior chart-discussion with GI and surgical consultants DVT prophylaxis: Heparin infusion and SCDs Code Status: Full code Family Communication:   Disposition Plan: Most likely home after work-up is complete and she is tolerating a diet Consultants:   GI  General surgery Procedures:  None Antimicrobials:  Anti-infectives (From admission, onward)   None       Objective: Vitals:   03/17/19 1646 03/17/19 2053 03/17/19 2339 03/18/19 0342  BP: (!) 119/98 130/73 119/64 140/80  Pulse: 65 60 (!) 59 67  Resp: 14 18 16 17   Temp: 97.6 F (36.4 C) 97.8 F (36.6 C) 97.7 F (36.5 C) (!) 97.5 F (36.4 C)  TempSrc: Oral Oral Oral Oral  SpO2: 95% 96% 92% 94%  Weight:  57.5 kg  57 kg  Height:        Intake/Output Summary (Last 24 hours) at 03/18/2019 0734 Last data filed at 03/18/2019 0351 Gross per 24 hour  Intake 987.25 ml  Output --  Net 987.25 ml   Filed Weights   03/16/19 2042 03/17/19 2053 03/18/19 0342  Weight: 63.5 kg 57.5 kg 57 kg    Examination: General exam: Appears comfortable  HEENT: PERRLA, oral mucosa moist, no sclera icterus or thrush Respiratory system: Clear to auscultation. Respiratory effort normal. Cardiovascular system: S1 & S2 heard, RRR.   Gastrointestinal system: Abdomen soft, significant epigastric tenderness is present, nondistended. Normal bowel sounds. Central nervous system: Alert and oriented. No focal neurological deficits. Extremities: No cyanosis, clubbing or edema Skin: No rashes or ulcers Psychiatry:  Mood & affect appropriate.     Data Reviewed: I have personally reviewed following labs and imaging studies  CBC: Recent Labs  Lab 03/16/19 2049 03/18/19 0601  WBC 6.2 4.8  HGB 12.8 11.5*  HCT 39.9 36.1  MCV 83.3 83.8  PLT 276 350   Basic  Metabolic Panel: Recent Labs  Lab 03/16/19 2049 03/18/19 0601  NA 136 137  K 2.9* 3.5  CL 97* 102  CO2 28 26  GLUCOSE 115* 96  BUN 60* 48*  CREATININE 2.94* 2.19*  CALCIUM 9.2 8.6*  MG 2.9* 2.7*   GFR: Estimated Creatinine Clearance: 18.4 mL/min (A) (by C-G formula based on SCr of 2.19 mg/dL (H)). Liver Function Tests: Recent Labs  Lab 03/16/19 2049 03/18/19 0601  AST 147* 132*  ALT 75* 72*  ALKPHOS 81 70  BILITOT 1.3* 0.9  PROT 8.5* 6.9  ALBUMIN 3.5 2.9*   Recent Labs  Lab 03/16/19 2049  LIPASE 38   No results for input(s): AMMONIA in the last 168 hours. Coagulation Profile: Recent Labs  Lab 03/18/19 0601  INR 1.7*   Cardiac Enzymes: No results for input(s): CKTOTAL, CKMB, CKMBINDEX, TROPONINI in the last 168 hours. BNP (last 3 results) No results for input(s): PROBNP in the last 8760 hours. HbA1C: No results for input(s): HGBA1C in the last 72 hours. CBG: No results for input(s): GLUCAP in the last 168 hours. Lipid Profile: No results for input(s): CHOL, HDL, LDLCALC, TRIG, CHOLHDL, LDLDIRECT in the  last 72 hours. Thyroid Function Tests: No results for input(s): TSH, T4TOTAL, FREET4, T3FREE, THYROIDAB in the last 72 hours. Anemia Panel: No results for input(s): VITAMINB12, FOLATE, FERRITIN, TIBC, IRON, RETICCTPCT in the last 72 hours. Urine analysis:    Component Value Date/Time   COLORURINE AMBER (A) 03/16/2019 2046   APPEARANCEUR HAZY (A) 03/16/2019 2046   LABSPEC 1.009 03/16/2019 2046   PHURINE 7.0 03/16/2019 2046   GLUCOSEU NEGATIVE 03/16/2019 2046   HGBUR LARGE (A) 03/16/2019 2046   BILIRUBINUR NEGATIVE 03/16/2019 2046   [place] NEGATIVE 03/16/2019 2046   PROTEINUR 30 (A) 03/16/2019 2046   UROBILINOGEN 1.0 08/29/2014 2035   NITRITE NEGATIVE 03/16/2019 2046   LEUKOCYTESUR NEGATIVE 03/16/2019 2046   Sepsis Labs: @LABRCNTIP (procalcitonin:4,lacticidven:4) ) Recent Results (from the past 240 hour(s))  SARS CORONAVIRUS 2 (TAT 6-24 HRS)  Nasopharyngeal Nasopharyngeal Swab     Status: None   Collection Time: 03/17/19 10:29 AM   Specimen: Nasopharyngeal Swab  Result Value Ref Range Status   SARS Coronavirus 2 NEGATIVE NEGATIVE Final    Comment: (NOTE) SARS-CoV-2 target nucleic acids are NOT DETECTED. The SARS-CoV-2 RNA is generally detectable in upper and lower respiratory specimens during the acute phase of infection. Negative results do not preclude SARS-CoV-2 infection, do not rule out co-infections with other pathogens, and should not be used as the sole basis for treatment or other patient management decisions. Negative results must be combined with clinical observations, patient history, and epidemiological information. The expected result is Negative. Fact Sheet for Patients: SugarRoll.be Fact Sheet for Healthcare Providers: https://www.woods-mathews.com/ This test is not yet approved or cleared by the [place] FDA and  has been authorized for detection and/or diagnosis of SARS-CoV-2 by FDA under an Emergency Use Authorization (EUA). This EUA will remain  in effect (meaning this test can be used) for the duration of the COVID-19 declaration under Section 56 4(b)(1) of the Act, 21 U.S.C. section 360bbb-3(b)(1), unless the authorization is terminated or revoked sooner. Performed at [facility], [place] [address] [place], [place] [address]          Radiology Studies: Nm Hepatobiliary Liver Func  Result Date: 03/17/2019 CLINICAL DATA:  Epigastric abdominal pain over the last 2 weeks with intermittent nausea and vomiting with each meal. EXAM: NUCLEAR MEDICINE HEPATOBILIARY IMAGING TECHNIQUE: Sequential images of the abdomen were obtained out to 60 minutes following intravenous administration of radiopharmaceutical. RADIOPHARMACEUTICALS:  5.3 mCi Tc-59m  Choletec IV COMPARISON:  None. FINDINGS: Prompt uptake and biliary excretion of activity by the liver is seen.  Gallbladder activity is visualized, consistent with patency of cystic duct. Biliary activity passes into small bowel, consistent with patent common bile duct. IMPRESSION: Normal exam.  No evidence of cholecystitis. Electronically Signed   By: Franki Cabot M.D.   On: 03/17/2019 16:26   Ct Renal Stone Study  Result Date: 03/17/2019 CLINICAL DATA:  Hematuria EXAM: CT ABDOMEN AND PELVIS WITHOUT CONTRAST TECHNIQUE: Multidetector CT imaging of the abdomen and pelvis was performed following the standard protocol without IV contrast. COMPARISON:  01/28/2019 FINDINGS: LOWER CHEST: Moderate cardiomegaly HEPATOBILIARY: Mildly nodular hepatic contours with relative hypertrophy of the caudate and left hepatic lobe, consistent with hepatic cirrhosis. No focal liver lesion. No biliary dilatation. Hyperdensity in the gallbladder may be vicarious excretion of contrast related to a prior study. PANCREAS: The pancreatic parenchymal contours are normal and there is no ductal dilatation. There is no peripancreatic fluid collection. SPLEEN: Normal. ADRENALS/URINARY TRACT: --Adrenal glands: Unchanged right adrenal myelolipoma --Right kidney/ureter: Single  nonobstructing renal calculus measuring 6 mm. No hydronephrosis, perinephric stranding or solid renal mass. --Left kidney/ureter: Single nonobstructing renal calculus measuring 3 mm. No hydronephrosis, perinephric stranding or solid renal mass. --Urinary bladder: Normal for degree of distention STOMACH/BOWEL: --Stomach/Duodenum: Mild wall thickening at the gastric fundus. Normal duodenal course. --Small bowel: No dilatation or inflammation. --Colon: Extensive diverticulosis without acute inflammation. --Appendix: Not visualized. No right lower quadrant inflammation or free fluid. VASCULAR/LYMPHATIC: There is aortic atherosclerosis without hemodynamically significant stenosis. No abdominal or pelvic lymphadenopathy. REPRODUCTIVE: Small calcified uterine fibroid. No adnexal mass.  MUSCULOSKELETAL. Grade 1 anterolisthesis at L4-5. OTHER: None. IMPRESSION: 1. No obstructive uropathy or other acute abdominal or pelvic abnormality. 2. Nonobstructive bilateral nephrolithiasis. 3. Hepatic cirrhosis. 4. Aortic Atherosclerosis (ICD10-I70.0). Electronically Signed   By: Ulyses Jarred M.D.   On: 03/17/2019 06:02   US Abdomen Limited Ruq  Result Date: 03/17/2019 CLINICAL DATA:  Epigastric pain for 2 weeks, nausea and vomiting. EXAM: ULTRASOUND ABDOMEN LIMITED RIGHT UPPER QUADRANT COMPARISON:  None. FINDINGS: Gallbladder: Gallbladder walls appear thickened, with a demonstrated measurement of 7 mm. Heterogeneous masslike structure at the gallbladder fundus measures 2.6 cm greatest dimension. No gallstones seen. No sonographic Murphy's sign elicited during the exam. Common bile duct: Diameter: 5 mm Liver: Liver echotexture appears coarsened and the peripheral liver contours appear slightly nodular suggesting underlying cirrhosis. No focal liver abnormality identified. Portal vein is patent on color Doppler imaging with normal direction of blood flow towards the liver. Other: None. IMPRESSION: 1. Gallbladder walls are thickened. Gallbladder wall thickening is nonspecific with differential considerations of cholecystitis, CHF, hypoproteinemia and reactive thickening secondary to adjacent liver disease. Given the cirrhotic appearing liver, I suspect this gallbladder wall thickening is reactive to adjacent liver disease. Given the epigastric pain, would consider nuclear medicine HIDA scan to exclude the less likely possibility of cholecystitis. 2. Heterogeneous masslike structure demonstrated at the gallbladder fundus, measuring 2.6 cm greatest dimension. Based on today's earlier CT, I suspect that this is abutting colon with heterogeneous appearance secondary to underlying diverticulosis. At minimum, recommend follow-up gallbladder ultrasound in 3 months to ensure stability or resolution and thereby  ensure benignity. If persistent or increased on subsequent ultrasound, would then recommend MRI for definitive characterization. 3. Cirrhotic appearing liver. Electronically Signed   By: Franki Cabot M.D.   On: 03/17/2019 08:30      Scheduled Meds:  amiodarone  200 mg Oral Daily   carvedilol  3.125 mg Oral BID WC   feeding supplement (ENSURE ENLIVE)  237 mL Oral Daily   ferrous gluconate  324 mg Oral BID   magnesium oxide  400 mg Oral Daily   mexiletine  200 mg Oral BID   pantoprazole (PROTONIX) IV  40 mg Intravenous Q12H   sodium chloride flush  3 mL Intravenous Q12H   Continuous Infusions:   LOS: 1 day      Debbe Odea, MD [facility] Pager: [email or web address] Password [email or web address] 03/18/2019, 7:34 AM

## 2019-03-18 NOTE — Progress Notes (Signed)
Patient finished half of bowel prep tonight and stated she did not feel like drinking anymore.  Patient is aware she must wake at 5am to finish in the AM per order.

## 2019-03-18 NOTE — Progress Notes (Signed)
Subjective: Patient continues to have epigastric and upper abdominal pain.  Objective: Vital signs in last 24 hours: Temp:  [97.5 F (36.4 C)-97.8 F (36.6 C)] 97.5 F (36.4 C) (09/21 0829) Pulse Rate:  [59-67] 60 (09/21 0829) Resp:  [12-18] 17 (09/21 0342) BP: (102-140)/(58-98) 102/59 (09/21 0829) SpO2:  [92 %-96 %] 95 % (09/21 0829) Weight:  [57 kg-57.5 kg] 57 kg (09/21 0342) Weight change: -6.004 kg Last BM Date: 03/16/19  PE: Thinly built, frail appearing GENERAL: Lying comfortably on bed ABDOMEN: epigastric tenderness, no rebound tenderness however voluntary guarding is noted, lower abdomen is soft and has normal active bowel sounds EXTREMITIES: No edema  Lab Results: Results for orders placed or performed during the hospital encounter of 03/16/19 (from the past 48 hour(s))  Urinalysis, Routine w reflex microscopic     Status: Abnormal   Collection Time: 03/16/19  8:46 PM  Result Value Ref Range   Color, Urine AMBER (A) YELLOW    Comment: BIOCHEMICALS MAY BE AFFECTED BY COLOR   APPearance HAZY (A) CLEAR   Specific Gravity, Urine 1.009 1.005 - 1.030   pH 7.0 5.0 - 8.0   Glucose, UA NEGATIVE NEGATIVE mg/dL   Hgb urine dipstick LARGE (A) NEGATIVE   Bilirubin Urine NEGATIVE NEGATIVE   Ketones, ur NEGATIVE NEGATIVE mg/dL   Protein, ur 30 (A) NEGATIVE mg/dL   Nitrite NEGATIVE NEGATIVE   Leukocytes,Ua NEGATIVE NEGATIVE   RBC / HPF >50 (H) 0 - 5 RBC/hpf   WBC, UA 0-5 0 - 5 WBC/hpf   Bacteria, UA NONE SEEN NONE SEEN   Squamous Epithelial / LPF 0-5 0 - 5   Hyaline Casts, UA PRESENT    Crystals PRESENT (A) NEGATIVE    Comment: Performed at [facility], [address], [place], [place] [address]  Lipase, blood     Status: None   Collection Time: 03/16/19  8:49 PM  Result Value Ref Range   Lipase 38 11 - 51 U/L    Comment: Performed at [facility], [place] [address] [place], [place] [address]  Comprehensive metabolic panel     Status: Abnormal   Collection  Time: 03/16/19  8:49 PM  Result Value Ref Range   Sodium 136 135 - 145 mmol/L   Potassium 2.9 (L) 3.5 - 5.1 mmol/L   Chloride 97 (L) 98 - 111 mmol/L   CO2 28 22 - 32 mmol/L   Glucose, Bld 115 (H) 70 - 99 mg/dL   BUN 60 (H) 8 - 23 mg/dL   Creatinine, Ser 2.94 (H) 0.44 - 1.00 mg/dL   Calcium 9.2 8.9 - 10.3 mg/dL   Total Protein 8.5 (H) 6.5 - 8.1 g/dL   Albumin 3.5 3.5 - 5.0 g/dL   AST 147 (H) 15 - 41 U/L   ALT 75 (H) 0 - 44 U/L   Alkaline Phosphatase 81 38 - 126 U/L   Total Bilirubin 1.3 (H) 0.3 - 1.2 mg/dL   GFR calc non Af Amer 15 (L) >60 mL/min   GFR calc Af Amer 17 (L) >60 mL/min   Anion gap 11 5 - 15    Comment: Performed at [name], [place] [address], [place] [address]  CBC     Status: Abnormal   Collection Time: 03/16/19  8:49 PM  Result Value Ref Range   WBC 6.2 4.0 - 10.5 K/uL   RBC 4.79 3.87 - 5.11 MIL/uL   Hemoglobin 12.8 12.0 - 15.0 g/dL  HCT 39.9 36.0 - 46.0 %   MCV 83.3 80.0 - 100.0 fL   MCH 26.7 26.0 - 34.0 pg   MCHC 32.1 30.0 - 36.0 g/dL   RDW 22.0 (H) 11.5 - 15.5 %   Platelets 276 150 - 400 K/uL   nRBC 0.0 0.0 - 0.2 %    Comment: Performed at [facility] [address], [place], [place] [address]  Magnesium     Status: Abnormal   Collection Time: 03/16/19  8:49 PM  Result Value Ref Range   Magnesium 2.9 (H) 1.7 - 2.4 mg/dL    Comment: Performed at [facility] [address] [place], [place] [address]  SARS CORONAVIRUS 2 (TAT 6-24 HRS) Nasopharyngeal Nasopharyngeal Swab     Status: None   Collection Time: 03/17/19 10:29 AM   Specimen: Nasopharyngeal Swab  Result Value Ref Range   SARS Coronavirus 2 NEGATIVE NEGATIVE    Comment: (NOTE) SARS-CoV-2 target nucleic acids are NOT DETECTED. The SARS-CoV-2 RNA is generally detectable in upper and lower respiratory specimens during the acute phase of infection. Negative results do not preclude SARS-CoV-2 infection, do not rule out co-infections with other pathogens, and should  not be used as the sole basis for treatment or other patient management decisions. Negative results must be combined with clinical observations, patient history, and epidemiological information. The expected result is Negative. Fact Sheet for Patients: SugarRoll.be Fact Sheet for Healthcare Providers: https://www.woods-mathews.com/ This test is not yet approved or cleared by the [place] FDA and  has been authorized for detection and/or diagnosis of SARS-CoV-2 by FDA under an Emergency Use Authorization (EUA). This EUA will remain  in effect (meaning this test can be used) for the duration of the COVID-19 declaration under Section 56 4(b)(1) of the Act, 21 U.S.C. section 360bbb-3(b)(1), unless the authorization is terminated or revoked sooner. Performed at [facility], [place] [address] [place], [place] [address]   CBC     Status: Abnormal   Collection Time: 03/18/19  6:01 AM  Result Value Ref Range   WBC 4.8 4.0 - 10.5 K/uL   RBC 4.31 3.87 - 5.11 MIL/uL   Hemoglobin 11.5 (L) 12.0 - 15.0 g/dL   HCT 36.1 36.0 - 46.0 %   MCV 83.8 80.0 - 100.0 fL   MCH 26.7 26.0 - 34.0 pg   MCHC 31.9 30.0 - 36.0 g/dL   RDW 21.5 (H) 11.5 - 15.5 %   Platelets 211 150 - 400 K/uL   nRBC 0.0 0.0 - 0.2 %    Comment: Performed at [facility], [place] [address], [place], [place] [address]  Comprehensive metabolic panel     Status: Abnormal   Collection Time: 03/18/19  6:01 AM  Result Value Ref Range   Sodium 137 135 - 145 mmol/L   Potassium 3.5 3.5 - 5.1 mmol/L    Comment: DELTA CHECK NOTED   Chloride 102 98 - 111 mmol/L   CO2 26 22 - 32 mmol/L   Glucose, Bld 96 70 - 99 mg/dL   BUN 48 (H) 8 - 23 mg/dL   Creatinine, Ser 2.19 (H) 0.44 - 1.00 mg/dL   Calcium 8.6 (L) 8.9 - 10.3 mg/dL   Total Protein 6.9 6.5 - 8.1 g/dL   Albumin 2.9 (L) 3.5 - 5.0 g/dL   AST 132 (H) 15 - 41 U/L   ALT 72 (H) 0 - 44 U/L   Alkaline Phosphatase 70 38 - 126 U/L  Total Bilirubin 0.9 0.3 - 1.2 mg/dL   GFR calc non Af Amer 21 (L) >60 mL/min   GFR calc Af Amer 25 (L) >60 mL/min   Anion gap 9 5 - 15    Comment: Performed at [facility] [address] [place], [place] [address]  Magnesium     Status: Abnormal   Collection Time: 03/18/19  6:01 AM  Result Value Ref Range   Magnesium 2.7 (H) 1.7 - 2.4 mg/dL    Comment: Performed at [facility] [address], [place], [place] [address]  Protime-INR     Status: Abnormal   Collection Time: 03/18/19  6:01 AM  Result Value Ref Range   Prothrombin Time 19.6 (H) 11.4 - 15.2 seconds   INR 1.7 (H) 0.8 - 1.2    Comment: (NOTE) INR goal varies based on device and disease states. Performed at [facility], [place] [address] [place], [place] [address]     Studies/Results: Nm Hepatobiliary Liver Func  Result Date: 03/17/2019 CLINICAL DATA:  Epigastric abdominal pain over the last 2 weeks with intermittent nausea and vomiting with each meal. EXAM: NUCLEAR MEDICINE HEPATOBILIARY IMAGING TECHNIQUE: Sequential images of the abdomen were obtained out to 60 minutes following intravenous administration of radiopharmaceutical. RADIOPHARMACEUTICALS:  5.3 mCi Tc-44m  Choletec IV COMPARISON:  None. FINDINGS: Prompt uptake and biliary excretion of activity by the liver is seen. Gallbladder activity is visualized, consistent with patency of cystic duct. Biliary activity passes into small bowel, consistent with patent common bile duct. IMPRESSION: Normal exam.  No evidence of cholecystitis. Electronically Signed   By: Franki Cabot M.D.   On: 03/17/2019 16:26   Ct Renal Stone Study  Result Date: 03/17/2019 CLINICAL DATA:  Hematuria EXAM: CT ABDOMEN AND PELVIS WITHOUT CONTRAST TECHNIQUE: Multidetector CT imaging of the abdomen and pelvis was performed following the standard protocol without IV contrast. COMPARISON:  01/28/2019 FINDINGS: LOWER CHEST: Moderate cardiomegaly HEPATOBILIARY: Mildly nodular hepatic  contours with relative hypertrophy of the caudate and left hepatic lobe, consistent with hepatic cirrhosis. No focal liver lesion. No biliary dilatation. Hyperdensity in the gallbladder may be vicarious excretion of contrast related to a prior study. PANCREAS: The pancreatic parenchymal contours are normal and there is no ductal dilatation. There is no peripancreatic fluid collection. SPLEEN: Normal. ADRENALS/URINARY TRACT: --Adrenal glands: Unchanged right adrenal myelolipoma --Right kidney/ureter: Single nonobstructing renal calculus measuring 6 mm. No hydronephrosis, perinephric stranding or solid renal mass. --Left kidney/ureter: Single nonobstructing renal calculus measuring 3 mm. No hydronephrosis, perinephric stranding or solid renal mass. --Urinary bladder: Normal for degree of distention STOMACH/BOWEL: --Stomach/Duodenum: Mild wall thickening at the gastric fundus. Normal duodenal course. --Small bowel: No dilatation or inflammation. --Colon: Extensive diverticulosis without acute inflammation. --Appendix: Not visualized. No right lower quadrant inflammation or free fluid. VASCULAR/LYMPHATIC: There is aortic atherosclerosis without hemodynamically significant stenosis. No abdominal or pelvic lymphadenopathy. REPRODUCTIVE: Small calcified uterine fibroid. No adnexal mass. MUSCULOSKELETAL. Grade 1 anterolisthesis at L4-5. OTHER: None. IMPRESSION: 1. No obstructive uropathy or other acute abdominal or pelvic abnormality. 2. Nonobstructive bilateral nephrolithiasis. 3. Hepatic cirrhosis. 4. Aortic Atherosclerosis (ICD10-I70.0). Electronically Signed   By: Ulyses Jarred M.D.   On: 03/17/2019 06:02   US Abdomen Limited Ruq  Result Date: 03/17/2019 CLINICAL DATA:  Epigastric pain for 2 weeks, nausea and vomiting. EXAM: ULTRASOUND ABDOMEN LIMITED RIGHT UPPER QUADRANT COMPARISON:  None. FINDINGS: Gallbladder: Gallbladder walls appear thickened, with a demonstrated measurement of 7 mm. Heterogeneous masslike  structure at the gallbladder  fundus measures 2.6 cm greatest dimension. No gallstones seen. No sonographic Murphy's sign elicited during the exam. Common bile duct: Diameter: 5 mm Liver: Liver echotexture appears coarsened and the peripheral liver contours appear slightly nodular suggesting underlying cirrhosis. No focal liver abnormality identified. Portal vein is patent on color Doppler imaging with normal direction of blood flow towards the liver. Other: None. IMPRESSION: 1. Gallbladder walls are thickened. Gallbladder wall thickening is nonspecific with differential considerations of cholecystitis, CHF, hypoproteinemia and reactive thickening secondary to adjacent liver disease. Given the cirrhotic appearing liver, I suspect this gallbladder wall thickening is reactive to adjacent liver disease. Given the epigastric pain, would consider nuclear medicine HIDA scan to exclude the less likely possibility of cholecystitis. 2. Heterogeneous masslike structure demonstrated at the gallbladder fundus, measuring 2.6 cm greatest dimension. Based on today's earlier CT, I suspect that this is abutting colon with heterogeneous appearance secondary to underlying diverticulosis. At minimum, recommend follow-up gallbladder ultrasound in 3 months to ensure stability or resolution and thereby ensure benignity. If persistent or increased on subsequent ultrasound, would then recommend MRI for definitive characterization. 3. Cirrhotic appearing liver. Electronically Signed   By: Franki Cabot M.D.   On: 03/17/2019 08:30    Medications: I have reviewed the patient's current medications.  Assessment: Epigastric and upper abdominal pain of several months  Abnormal liver enzymes(AST 132, ALT 72), normal HIDA from 03/17/2019, heterogeneous masslike structure L gallbladder fundus measuring 2.6 cm in dimension  Renal impairment  Cirrhotic liver, T bili 0.9, INR 1.7, creatinine 2.19 MELDNa 23, no evidence of  decompensation(esophageal varices not noted on recent EGD, no signs of ascites or encephalopathy), prior work-up in 2018 showed no evidence of chronic hepatitis B/hepatitis C, unremarkable alpha-1 antitrypsin, AMA, iron panel, ceruloplasmin  EGD from 06/15/2017 and 01/22/2019 changes of portal hypertensive gastropathy possible GAVE  Plan: Gallbladder fundic mass noted on CAT scan from 06/2017 and ultrasound from 03/17/2019 Unable to get a CT with contrast due to renal impairment. Unable to get an MRI as patient has a defibrillator. Recommend surgical evaluation for consideration for cholecystectomy  Meanwhile will schedule patient for EGD and a colonoscopy tomorrow, Eliquis is on hold since Saturday, last colonoscopy was performed in 2011 and showed colonic diverticulosis.   Ronnette Juniper 03/18/2019, 8:35 AM   Pager [phone] If no answer or after 5 PM call [phone]

## 2019-03-19 ENCOUNTER — Inpatient Hospital Stay (HOSPITAL_COMMUNITY): Payer: Medicare Other | Admitting: Anesthesiology

## 2019-03-19 ENCOUNTER — Encounter (HOSPITAL_COMMUNITY): Payer: Self-pay | Admitting: Certified Registered Nurse Anesthetist

## 2019-03-19 ENCOUNTER — Encounter (HOSPITAL_COMMUNITY)
Admission: EM | Disposition: A | Discharge status: Home Health | Payer: Self-pay | Source: Home / Self Care | Attending: Internal Medicine

## 2019-03-19 DIAGNOSIS — R109 Unspecified abdominal pain: Secondary | ICD-10-CM

## 2019-03-19 HISTORY — PX: ESOPHAGOGASTRODUODENOSCOPY (EGD) WITH PROPOFOL: SHX5813

## 2019-03-19 HISTORY — PX: POLYPECTOMY: SHX5525

## 2019-03-19 HISTORY — PX: COLONOSCOPY WITH PROPOFOL: SHX5780

## 2019-03-19 LAB — BASIC METABOLIC PANEL
Anion gap: 8 (ref 5–15)
BUN: 37 mg/dL — ABNORMAL HIGH (ref 8–23)
CO2: 26 mmol/L (ref 22–32)
Calcium: 8.7 mg/dL — ABNORMAL LOW (ref 8.9–10.3)
Chloride: 104 mmol/L (ref 98–111)
Creatinine, Ser: 1.81 mg/dL — ABNORMAL HIGH (ref 0.44–1.00)
GFR calc Af Amer: 31 mL/min — ABNORMAL LOW (ref >60)
GFR calc non Af Amer: 27 mL/min — ABNORMAL LOW (ref >60)
Glucose, Bld: 86 mg/dL (ref 70–99)
Potassium: 4.6 mmol/L (ref 3.5–5.1)
Sodium: 138 mmol/L (ref 135–145)

## 2019-03-19 LAB — VITAMIN B12: Vitamin B-12: 469 pg/mL (ref 180–914)

## 2019-03-19 LAB — MAGNESIUM: Magnesium: 2.3 mg/dL (ref 1.7–2.4)

## 2019-03-19 SURGERY — ESOPHAGOGASTRODUODENOSCOPY (EGD) WITH PROPOFOL
Anesthesia: Monitor Anesthesia Care

## 2019-03-19 MED ORDER — APIXABAN 2.5 MG PO TABS
2.5000 mg | ORAL_TABLET | Freq: Two times a day (BID) | ORAL | Status: DC
  Administered 2019-03-19 – 2019-03-21 (×4): 2.5 mg via ORAL
  Filled 2019-03-19 (×4): qty 1

## 2019-03-19 MED ORDER — SODIUM CHLORIDE 0.9 % IV SOLN
INTRAVENOUS | Status: DC
  Administered 2019-03-19: 08:00:00 via INTRAVENOUS

## 2019-03-19 MED ORDER — POLYETHYLENE GLYCOL 3350 17 G PO PACK: 17.0000 g | PACK | Freq: Every day | ORAL | 3 refills | Status: DC

## 2019-03-19 MED ORDER — APIXABAN 2.5 MG PO TABS: 2.5000 mg | ORAL_TABLET | Freq: Two times a day (BID) | ORAL | 0 refills | Status: DC

## 2019-03-19 MED ORDER — PROPOFOL 10 MG/ML IV BOLUS
INTRAVENOUS | Status: DC | PRN
  Administered 2019-03-19 (×3): 10 mg via INTRAVENOUS

## 2019-03-19 MED ORDER — LIDOCAINE 2% (20 MG/ML) 5 ML SYRINGE
INTRAMUSCULAR | Status: DC | PRN
  Administered 2019-03-19: 20 mg via INTRAVENOUS

## 2019-03-19 MED ORDER — PANTOPRAZOLE SODIUM 40 MG PO TBEC: 40.0000 mg | DELAYED_RELEASE_TABLET | Freq: Two times a day (BID) | ORAL | 0 refills | Status: DC

## 2019-03-19 MED ORDER — ACETAMINOPHEN 325 MG PO TABS
650.0000 mg | ORAL_TABLET | Freq: Four times a day (QID) | ORAL | Status: DC | PRN
  Administered 2019-03-19 – 2019-03-20 (×2): 650 mg via ORAL
  Filled 2019-03-19 (×2): qty 2

## 2019-03-19 MED ORDER — PROPOFOL 500 MG/50ML IV EMUL
INTRAVENOUS | Status: DC | PRN
  Administered 2019-03-19: 25 ug/kg/min via INTRAVENOUS

## 2019-03-19 MED ORDER — SUCRALFATE 1 GM/10ML PO SUSP: 1.0000 g | Freq: Three times a day (TID) | ORAL | 0 refills | Status: DC

## 2019-03-19 MED ORDER — LACTATED RINGERS IV SOLN
INTRAVENOUS | Status: DC | PRN
  Administered 2019-03-19: 08:00:00 via INTRAVENOUS

## 2019-03-19 MED ORDER — SUCRALFATE 1 GM/10ML PO SUSP
1.0000 g | Freq: Three times a day (TID) | ORAL | Status: DC
  Administered 2019-03-19 – 2019-03-21 (×9): 1 g via ORAL
  Filled 2019-03-19 (×8): qty 10

## 2019-03-19 SURGICAL SUPPLY — 24 items

## 2019-03-19 NOTE — Final Consult Note (Signed)
Consultant Final Sign-Off Note    Assessment/Final recommendations  Stephanie Yates is a 73 y.o. female followed by me for possible gallbladder mass. Patient underwent EGD and colonoscopy today which revealed some esophagitis, gastric irritation and polyps and colon polyps. EUS not done. GI recommending follow up EGD in 3-6 months and agrees with follow up RUQ [place] in 3-6 months. No acute surgical issues on this patient, we will sign off.    Wound care (if applicable): N/A   Diet at discharge: per primary team   Activity at discharge: per primary team   Follow-up appointment:  GI in 6-8 weeks   Pending results:  Unresulted Labs (From admission, onward)   None       Medication recommendations: Per GI - Protonix to twice a day for 2 weeks.  Add Carafate twice a day for 2 weeks.   Other recommendations: follow up US in 3-6 months    Thank you for allowing [place] to participate in the care of your patient!  Please consult [place] again if you have further needs for your patient.  Claiborne Billings Rayburn 03/19/2019 11:16 AM    Subjective   Patient still having some epigastric abdominal pain but not severe. No further nausea or vomiting.   Objective  Vital signs in last 24 hours: Temp:  [97.4 F (36.3 C)-97.8 F (36.6 C)] 97.8 F (36.6 C) (09/22 1041) Pulse Rate:  [58-60] 59 (09/22 1041) Resp:  [13-20] 18 (09/22 1041) BP: (122-159)/(52-94) 145/69 (09/22 1041) SpO2:  [71 %-100 %] 97 % (09/22 1041) Weight:  [58.2 kg] 58.2 kg (09/22 0346)  General: alert, NAD CV: RRR Resp: normal effort, CTAB GI: soft, ttp in epigastrium, ND, +BS Skin: no rash Psych: alert and oriented x4   Pertinent labs and Studies: Recent Labs    03/16/19 09-28[birth year]47 03/18/19 0601  WBC 6.2 4.8  HGB 12.8 11.5*  HCT 39.9 36.1   BMET Recent Labs    03/18/19 0601 03/19/19 0425  NA 137 138  K 3.5 4.6  CL 102 104  CO2 26 26  GLUCOSE 96 86  BUN 48* 37*  CREATININE 2.19* 1.81*  CALCIUM 8.6* 8.7*   No results for  input(s): LABURIN in the last 72 hours. Results for orders placed or performed during the hospital encounter of 03/16/19  SARS CORONAVIRUS 2 (TAT 6-24 HRS) Nasopharyngeal Nasopharyngeal Swab     Status: None   Collection Time: 03/17/19 10:29 AM   Specimen: Nasopharyngeal Swab  Result Value Ref Range Status   SARS Coronavirus 2 NEGATIVE NEGATIVE Final    Comment: (NOTE) SARS-CoV-2 target nucleic acids are NOT DETECTED. The SARS-CoV-2 RNA is generally detectable in upper and lower respiratory specimens during the acute phase of infection. Negative results do not preclude SARS-CoV-2 infection, do not rule out co-infections with other pathogens, and should not be used as the sole basis for treatment or other patient management decisions. Negative results must be combined with clinical observations, patient history, and epidemiological information. The expected result is Negative. Fact Sheet for Patients: SugarRoll.be Fact Sheet for Healthcare Providers: https://www.woods-mathews.com/ This test is not yet approved or cleared by the [place] FDA and  has been authorized for detection and/or diagnosis of SARS-CoV-2 by FDA under an Emergency Use Authorization (EUA). This EUA will remain  in effect (meaning this test can be used) for the duration of the COVID-19 declaration under Section 56 4(b)(1) of the Act, 21 U.S.C. section 360bbb-3(b)(1), unless the authorization is terminated or revoked sooner. Performed at  [facility], [place] [address], [place], [place] [address]     Imaging: No results found.

## 2019-03-19 NOTE — Progress Notes (Signed)
[facility] hospitalist   Patient is having epigastric abdominal pain, ongoing nausea and a headache. GI had ordered solid food but will down grade to full liquids. We will not d/c her home today due to need for ongoing IV nausea and pain medications. Will not resume diuretics.  F/u Bmet again tomorrow AM.   Debbe Odea, MD

## 2019-03-19 NOTE — Op Note (Signed)
[facility] Patient Name: Stephanie Yates Procedure Date : 03/19/2019 MRN: [id number] Attending MD: Otis Brace , MD Date of Birth: [birth year] CSN: [id number] Age: 73 Admit Type: Inpatient Procedure:                Upper GI endoscopy Indications:              Epigastric abdominal pain, Abdominal pain in the                            right upper quadrant Providers:                Otis Brace, MD, Carollee Sires, Technician Referring MD:              Medicines:                Sedation Administered by an Anesthesia Professional Complications:            No immediate complications. Estimated Blood Loss:     Estimated blood loss was minimal. Procedure:                Pre-Anesthesia Assessment:                           - Prior to the procedure, a History and Physical                            was performed, and patient medications and                            allergies were reviewed. The patient's tolerance of                            previous anesthesia was also reviewed. The risks                            and benefits of the procedure and the sedation                            options and risks were discussed with the patient.                            All questions were answered, and informed consent                            was obtained. Prior Anticoagulants: The patient                            last took Eliquis (apixaban) 3 days prior to the                            procedure and last took heparin on the day of the  procedure. ASA Grade Assessment: IV - A patient                            with severe systemic disease that is a constant                            threat to life. After reviewing the risks and                            benefits, the patient was deemed in satisfactory                            condition to undergo the procedure.                           After  obtaining informed consent, the endoscope was                            passed under direct vision. Throughout the                            procedure, the patient's blood pressure, pulse, and                            oxygen saturations were monitored continuously. The                            [id number]-H190 ([id number] Olympus gastroscope was                            introduced through the mouth, and advanced to the                            second part of duodenum. The upper GI endoscopy was                            accomplished without difficulty. The patient                            tolerated the procedure well. Scope In: Scope Out: Findings:      LA Grade C (one or more mucosal breaks continuous between tops of 2 or       more mucosal folds, less than 75% circumference) esophagitis with no       bleeding was found in the distal esophagus.      There is no endoscopic evidence of varices in the entire esophagus.      Three small inflammatory appearing sessile polyps were found in the       gastric body.      Localized moderate mucosal changes characterized by congestion,       inflammation and tight circumferential folds were found in the       prepyloric region of the stomach.      The cardia and gastric fundus were normal on retroflexion.      The duodenal bulb, first portion of the  duodenum and second portion of       the duodenum were normal. Impression:               - LA Grade C esophagitis.                           - Three gastric polyps.                           - Congested, inflamed and tight circumferentially                            folded mucosa in the prepyloric region of the                            stomach.                           - Normal duodenal bulb, first portion of the                            duodenum and second portion of the duodenum.                           - No specimens collected. Recommendation:           - Perform a colonoscopy  today. Procedure Code(s):        --- Professional ---                           [phone], Esophagogastroduodenoscopy, flexible,                            transoral; diagnostic, including collection of                            specimen(s) by brushing or washing, when performed                            (separate procedure) Diagnosis Code(s):        --- Professional ---                           K20.9, Esophagitis, unspecified                           K31.7, Polyp of stomach and duodenum                           K29.70, Gastritis, unspecified, without bleeding                           K31.89, Other diseases of stomach and duodenum                           R10.13, Epigastric pain  R10.11, Right upper quadrant pain CPT copyright 2019 [employer]. All rights reserved. The codes documented in this report are preliminary and upon coder review may  be revised to meet current compliance requirements. Otis Brace, MD Otis Brace, MD 03/19/2019 9:34:54 AM Number of Addenda: 0

## 2019-03-19 NOTE — Discharge Summary (Signed)
Physician Discharge Summary  Stephanie Yates DGU:[id number] DOB: 1946-06-06 DOA: 03/16/2019  PCP: Josetta Huddle, MD  Admit date: 03/16/2019 Discharge date: 03/19/2019  Admitted From: Home Disposition: Home  Recommendations for Outpatient Follow-up:  1. Follow-up gallbladder ultrasound in 3 to 6 months 2. Follow-up with GI as outpatient in 6 to 8 weeks 3. GI to follow-up on path report on polyps that have been removed 4. Recommend basic metabolic panel to check creatinine- her Eliquis dose has been decreased due to an weighted creatinine 5. Recommend close follow-up of potassium and magnesium to ensure that they are adequate due to history of V. tach Discharge Condition: Stable CODE STATUS: Full code Diet recommendation: Heart healthy, bland diet Consultations:  GI   Discharge Diagnoses:  Principal Problem:   Epigastric abdominal pain Active Problems:   Hypokalemia   Transaminitis   PAF (paroxysmal atrial fibrillation) (HCC)   ICD (implantable cardioverter-defibrillator), dual, [facility]   Chronic anticoagulation   Prolonged QT interval   Acute kidney injury superimposed on chronic kidney disease (HCC)   Nausea and vomiting   Right adrenal mass ([place]) possible adenoma   Nephrolithiasis   Brief Summary: EF 15% status post ICD, paroxysmal A. fib on Eliquis, cirrhosis, CKD stage IV, hypertension, hyperlipidemia who presented to the hospital with epigastric abdominal pain per the HPI.  The patient's daughter noted that the patient has been eating lots of fried/greasy foods,going to [employer],and drinking sodas.  Prior notes mention that she has chronic abdominal pain..  She  had an EGD 12/2018 revealing:  Segmental mild mucosal changes characterized by linear areas of desquamation were found in the entire Esophagus, Segmental severe mucosal changes characterized by congestion, erythema, erosion, inflammation and nodularity were found in the gastric antrum. Biopsies were negative for H.  pylori.    In the ED: Potassium 2.9, creatinine 2.94 increased from 2.0, noted to have hematuria CT renal stone study in the ED revealed the patient to have cirrhotic liver which is a new diagnosis for her.Hyperdensity in the gallbladder also noted. Ultrasound of the right upper quadrant revealed again a cirrhotic liver, thickened gallbladder wall measuring about 7 mm with and shown by the radiologist that "thickening is reactive to adjacent liver disease". GI consult was performed and a HIDA scan was ordered and performed yesterday and is found to be negative.  She was also noted to have hematuria.  The CT renal stone study showed nonobstructive bilateral nephrolithiasis.   Hospital Course:  Principal Problem:   Epigastric abdominal pain, nausea and vomiting -This is acute on chronic pain-see above details -  The patient is undergone an EGD and a colonoscopy today by Dr. Alessandra Bevels ZDG:LOVFIEPPIR:               - LA Grade C esophagitis.                           - Three gastric polyps.                           - Congested, inflamed and tight circumferentially                            folded mucosa in the prepyloric region of the  stomach.                           - Normal duodenal bulb, first portion of the                            duodenum and second portion of the duodenum.                           - No specimens collected. Colonoscopy:- One 8 mm polyp in the ascending colon, removed                            with a cold snare. Resected and retrieved.                           - Normal mucosa at the hepatic flexure.                           - Four 4 to 5 mm polyps in the transverse colon,                            removed with a cold snare. Resected and retrieved.                           - One 5 mm polyp in the descending colon, removed                            with a cold snare. Resected and retrieved.                           - Two small  polyps in the rectum, removed with a                            cold snare. Complete resection. Polyp tissue not                            retrieved.                           - Diverticulosis in the entire examined colon. - Plan: Continue twice daily PPI, add Carafate, strict instructions to patient on watching which he is eating and avoiding fried foods and caffeine which he tends to overindulge on-I have discussed this with her yesterday and reinforced it again today  Active Problems:  Gallbladder mass (has been present at least 2 years) with steady rise in LFTs this year -Due to acute rise in LFTs and new GI complaints, GI would like general surgery to evaluate this mass to decide if the patient needs a cholecystectomy-  this would be extremely complicated as her EF is 15% and she has a history of V. Tach last episode being as recent as last month -I have consulted general surgery today-they do not feel she needs surgery at this point but would like a low up ultrasound and 3 to 6 months  AKI CKD 3-4 - Her BUN and  creatinine rose to 60 and 2.94  -With holding diuretics and giving IV fluids her creatinine has improved to 1.81.  This puts her at a GFR of 31  Hypokalemia-history of V. tach -Potassium has improved from multiple dosages of potassium - Magnesium is adequate at 2.7     PAF (paroxysmal atrial fibrillation) CHA2DS2-VASc Score- 4 -Continue amiodarone-Eliquis is on hold- I will start a heparin infusion for A. fib and also to prevent an LV thrombus due to her low EF  Cirrhosis of the liver -I am not sure of the etiology of this-secondary to congestive heart failure?  Severe nonischemic cardiomyopathy with an EF of 15% V. tach-ICD (implantable cardioverter-defibrillator), dual, [facility] -Continue amiodarone, mexiletine, carvedilol -Closely monitor and replace electrolytes  Normocytic anemia-previously documented as anemia of chronic disease -Hemoglobin has been  between 11-13 range -Her last anemia panel was in 2018 - She is on iron replacement-we will recheck this as her ferritin was quite low at 15 and 2018 -Ferritin has been rechecked and is found to be 49 today which is adequate  Hematuria/nephrolithiasis -Even though her stones are nonobstructing, I suspect that being on Eliquis is what is causing her hematuria     Discharge Exam: Vitals:   03/19/19 0950 03/19/19 1041  BP:  (!) 145/69  Pulse: (!) 59 (!) 59  Resp: 13 18  Temp:  97.8 F (36.6 C)  SpO2: 96% 97%   Vitals:   03/19/19 0930 03/19/19 0940 03/19/19 0950 03/19/19 1041  BP: (!) 122/52 126/67  (!) 145/69  Pulse: (!) 59 (!) 59 (!) 59 (!) 59  Resp: 18 15 13 18   Temp:    97.8 F (36.6 C)  TempSrc:    Oral  SpO2: 98% 100% 96% 97%  Weight:      Height:        General: Pt is alert, awake, not in acute distress Cardiovascular: RRR, S1/S2 +, no rubs, no gallops Respiratory: CTA bilaterally, no wheezing, no rhonchi Abdominal: Soft, NT, ND, bowel sounds + Extremities: no edema, no cyanosis   Discharge Instructions  Discharge Instructions    Diet - low sodium heart healthy   Complete by: As directed    Avoid fried foods caffeine alcohol and cigarettes   Increase activity slowly   Complete by: As directed      Allergies as of 03/19/2019      Reactions   Strawberry Extract Hives, Itching, Swelling         Medication List    TAKE these medications   amiodarone 200 MG tablet Commonly known as: Pacerone Take 1 tablet (200 mg total) by mouth daily.   apixaban 2.5 MG Tabs tablet Commonly known as: ELIQUIS Take 1 tablet (2.5 mg total) by mouth 2 (two) times daily. What changed:   medication strength  how much to take   atorvastatin 40 MG tablet Commonly known as: LIPITOR Take 1 tablet (40 mg total) by mouth daily at 6 PM.   carvedilol 3.125 MG tablet Commonly known as: COREG Take 1 tablet (3.125 mg total) by mouth 2 (two) times daily with a meal.    Ensure Take 237 mLs by mouth daily.   ferrous gluconate 324 MG tablet Commonly known as: FERGON Take 324 mg by mouth 2 (two) times daily.   furosemide 80 MG tablet Commonly known as: LASIX Take 1 tablet (80 mg total) by mouth daily.   hydrALAZINE 25 MG tablet Commonly known as: APRESOLINE Take 0.5 tablets (12.5 mg total) by mouth  3 (three) times daily.   isosorbide mononitrate 30 MG 24 hr tablet Commonly known as: IMDUR Take 0.5 tablets (15 mg total) by mouth daily.   levalbuterol 45 MCG/ACT inhaler Commonly known as: XOPENEX HFA Inhale 1 puff into the lungs every 4 (four) hours as needed for wheezing.   MAGnesium-Oxide 400 (241.3 Mg) MG tablet Generic drug: magnesium oxide TAKE 1 TABLET BY MOUTH ONCE DAILY What changed:   how much to take  when to take this  reasons to take this   metolazone 2.5 MG tablet Commonly known as: ZAROXOLYN Take 1 tablet (2.5 mg total) by mouth daily as needed (leg swellings or shortness of breath or weight gain>5 pounds in 48 hours).   mexiletine 200 MG capsule Commonly known as: MEXITIL Take 1 capsule (200 mg total) by mouth 2 (two) times daily.   nitroGLYCERIN 0.4 MG SL tablet Commonly known as: NITROSTAT Place 1 tablet (0.4 mg total) under the tongue every 5 (five) minutes as needed for chest pain.   ondansetron 4 MG disintegrating tablet Commonly known as: Zofran ODT Take 1 tablet (4 mg total) by mouth every 8 (eight) hours as needed for nausea or vomiting.   pantoprazole 40 MG tablet Commonly known as: PROTONIX Take 1 tablet (40 mg total) by mouth 2 (two) times daily before a meal.   polyethylene glycol 17 g packet Commonly known as: MIRALAX / GLYCOLAX Take 17 g by mouth daily. Can take up to twice a day for constipation.   sucralfate 1 GM/10ML suspension Commonly known as: CARAFATE Take 10 mLs (1 g total) by mouth 4 (four) times daily -  with meals and at bedtime.   Vitamin D-3 25 MCG (1000 UT) Caps Take 1,000 Units  by mouth at bedtime.      Follow-up Information    Ronnette Juniper, MD. Schedule an appointment as soon as possible for a visit in 6 week(s).   Specialty: Gastroenterology Why: Recurrent abdominal pain, gallbladder lesion Contact information: [place] [address] [phone]        Josetta Huddle, MD. Schedule an appointment as soon as possible for a visit in 1 week(s).   Specialty: Internal Medicine Why: Please perform a blood work in 1 to 2 weeks to check her creatinine, potassium and magnesium- thank you Contact information: [address] [place] [address] [phone]          Allergies  Allergen Reactions  . Strawberry Extract Hives, Itching and Swelling          Procedures/Studies: EGD and colonoscopy  Nm Hepatobiliary Liver Func  Result Date: 03/17/2019 CLINICAL DATA:  Epigastric abdominal pain over the last 2 weeks with intermittent nausea and vomiting with each meal. EXAM: NUCLEAR MEDICINE HEPATOBILIARY IMAGING TECHNIQUE: Sequential images of the abdomen were obtained out to 60 minutes following intravenous administration of radiopharmaceutical. RADIOPHARMACEUTICALS:  5.3 mCi Tc-26m  Choletec IV COMPARISON:  None. FINDINGS: Prompt uptake and biliary excretion of activity by the liver is seen. Gallbladder activity is visualized, consistent with patency of cystic duct. Biliary activity passes into small bowel, consistent with patent common bile duct. IMPRESSION: Normal exam.  No evidence of cholecystitis. Electronically Signed   By: Franki Cabot M.D.   On: 03/17/2019 16:26   Dg Abd 2 Views  Result Date: 02/28/2019 CLINICAL DATA:  Constipation EXAM: ABDOMEN - 2 VIEW COMPARISON:  CT abdomen pelvis January 28, 2019 FINDINGS: Nonobstructive bowel gas pattern. Moderate volume of stool throughout the  colon. Air and stool overlies the rectal vault. No suspicious calcifications over the gallbladder fossa or renal shadows though  limited by overlying ingested material. The heart is enlarged with a 2 lead pacer/defibrillator device, battery pack within the soft tissues of the left chest wall. Lung bases are clear. IMPRESSION: Moderate volume of stool throughout the colon. No high-grade obstructive pattern. Electronically Signed   By: Lovena Le M.D.   On: 02/28/2019 06:21   Ct Renal Stone Study  Result Date: 03/17/2019 CLINICAL DATA:  Hematuria EXAM: CT ABDOMEN AND PELVIS WITHOUT CONTRAST TECHNIQUE: Multidetector CT imaging of the abdomen and pelvis was performed following the standard protocol without IV contrast. COMPARISON:  01/28/2019 FINDINGS: LOWER CHEST: Moderate cardiomegaly HEPATOBILIARY: Mildly nodular hepatic contours with relative hypertrophy of the caudate and left hepatic lobe, consistent with hepatic cirrhosis. No focal liver lesion. No biliary dilatation. Hyperdensity in the gallbladder may be vicarious excretion of contrast related to a prior study. PANCREAS: The pancreatic parenchymal contours are normal and there is no ductal dilatation. There is no peripancreatic fluid collection. SPLEEN: Normal. ADRENALS/URINARY TRACT: --Adrenal glands: Unchanged right adrenal myelolipoma --Right kidney/ureter: Single nonobstructing renal calculus measuring 6 mm. No hydronephrosis, perinephric stranding or solid renal mass. --Left kidney/ureter: Single nonobstructing renal calculus measuring 3 mm. No hydronephrosis, perinephric stranding or solid renal mass. --Urinary bladder: Normal for degree of distention STOMACH/BOWEL: --Stomach/Duodenum: Mild wall thickening at the gastric fundus. Normal duodenal course. --Small bowel: No dilatation or inflammation. --Colon: Extensive diverticulosis without acute inflammation. --Appendix: Not visualized. No right lower quadrant inflammation or free fluid. VASCULAR/LYMPHATIC: There is aortic atherosclerosis without hemodynamically significant stenosis. No abdominal or pelvic lymphadenopathy.  REPRODUCTIVE: Small calcified uterine fibroid. No adnexal mass. MUSCULOSKELETAL. Grade 1 anterolisthesis at L4-5. OTHER: None. IMPRESSION: 1. No obstructive uropathy or other acute abdominal or pelvic abnormality. 2. Nonobstructive bilateral nephrolithiasis. 3. Hepatic cirrhosis. 4. Aortic Atherosclerosis (ICD10-I70.0). Electronically Signed   By: Ulyses Jarred M.D.   On: 03/17/2019 06:02   US Abdomen Limited Ruq  Result Date: 03/17/2019 CLINICAL DATA:  Epigastric pain for 2 weeks, nausea and vomiting. EXAM: ULTRASOUND ABDOMEN LIMITED RIGHT UPPER QUADRANT COMPARISON:  None. FINDINGS: Gallbladder: Gallbladder walls appear thickened, with a demonstrated measurement of 7 mm. Heterogeneous masslike structure at the gallbladder fundus measures 2.6 cm greatest dimension. No gallstones seen. No sonographic Murphy's sign elicited during the exam. Common bile duct: Diameter: 5 mm Liver: Liver echotexture appears coarsened and the peripheral liver contours appear slightly nodular suggesting underlying cirrhosis. No focal liver abnormality identified. Portal vein is patent on color Doppler imaging with normal direction of blood flow towards the liver. Other: None. IMPRESSION: 1. Gallbladder walls are thickened. Gallbladder wall thickening is nonspecific with differential considerations of cholecystitis, CHF, hypoproteinemia and reactive thickening secondary to adjacent liver disease. Given the cirrhotic appearing liver, I suspect this gallbladder wall thickening is reactive to adjacent liver disease. Given the epigastric pain, would consider nuclear medicine HIDA scan to exclude the less likely possibility of cholecystitis. 2. Heterogeneous masslike structure demonstrated at the gallbladder fundus, measuring 2.6 cm greatest dimension. Based on today's earlier CT, I suspect that this is abutting colon with heterogeneous appearance secondary to underlying diverticulosis. At minimum, recommend follow-up gallbladder ultrasound  in 3 months to ensure stability or resolution and thereby ensure benignity. If persistent or increased on subsequent ultrasound, would then recommend MRI for definitive characterization. 3. Cirrhotic appearing liver. Electronically Signed   By: Franki Cabot M.D.   On: 03/17/2019 08:30   US Abdomen Limited  Ruq  Result Date: 02/23/2019 CLINICAL DATA:  73 year old female with acute abdominal pain. Elevated lipase. EXAM: ULTRASOUND ABDOMEN LIMITED RIGHT UPPER QUADRANT COMPARISON:  01/28/2019 prior CTs. FINDINGS: Gallbladder: Gallbladder wall thickening is identified and appears mildly edematous. More focal thickening of the gallbladder wall towards the fundus is noted. There is no evidence of cholelithiasis. Common bile duct: Diameter: 6 mm.  No intrahepatic or extrahepatic biliary dilatation. Liver: No focal lesion identified. Within normal limits in parenchymal echogenicity. Portal vein is patent on color Doppler imaging with normal direction of blood flow towards the liver. Other: None. IMPRESSION: 1. Nonspecific gallbladder wall thickening. No evidence of cholelithiasis. If there is strong clinical suspicion for acute cholecystitis, consider nuclear medicine study. 2. No other significant abnormalities. Electronically Signed   By: Margarette Canada M.D.   On: 02/23/2019 19:44     The results of significant diagnostics from this hospitalization (including imaging, microbiology, ancillary and laboratory) are listed below for reference.     Microbiology: Recent Results (from the past 240 hour(s))  SARS CORONAVIRUS 2 (TAT 6-24 HRS) Nasopharyngeal Nasopharyngeal Swab     Status: None   Collection Time: 03/17/19 10:29 AM   Specimen: Nasopharyngeal Swab  Result Value Ref Range Status   SARS Coronavirus 2 NEGATIVE NEGATIVE Final    Comment: (NOTE) SARS-CoV-2 target nucleic acids are NOT DETECTED. The SARS-CoV-2 RNA is generally detectable in upper and lower respiratory specimens during the acute phase of  infection. Negative results do not preclude SARS-CoV-2 infection, do not rule out co-infections with other pathogens, and should not be used as the sole basis for treatment or other patient management decisions. Negative results must be combined with clinical observations, patient history, and epidemiological information. The expected result is Negative. Fact Sheet for Patients: SugarRoll.be Fact Sheet for Healthcare Providers: https://www.woods-mathews.com/ This test is not yet approved or cleared by the [place] FDA and  has been authorized for detection and/or diagnosis of SARS-CoV-2 by FDA under an Emergency Use Authorization (EUA). This EUA will remain  in effect (meaning this test can be used) for the duration of the COVID-19 declaration under Section 56 4(b)(1) of the Act, 21 U.S.C. section 360bbb-3(b)(1), unless the authorization is terminated or revoked sooner. Performed at [facility], [place] [address] [place], [place] [address]      Labs: BNP (last 3 results) Recent Labs    09/02/18 1541 01/05/19 2213 01/10/19 0422  BNP 819.2* 1,129.5* 0,626.9*   Basic Metabolic Panel: Recent Labs  Lab 03/16/19 2049 03/18/19 0601 03/19/19 0425  NA 136 137 138  K 2.9* 3.5 4.6  CL 97* 102 104  CO2 28 26 26   GLUCOSE 115* 96 86  BUN 60* 48* 37*  CREATININE 2.94* 2.19* 1.81*  CALCIUM 9.2 8.6* 8.7*  MG 2.9* 2.7* 2.3   Liver Function Tests: Recent Labs  Lab 03/16/19 2049 03/18/19 0601  AST 147* 132*  ALT 75* 72*  ALKPHOS 81 70  BILITOT 1.3* 0.9  PROT 8.5* 6.9  ALBUMIN 3.5 2.9*   Recent Labs  Lab 03/16/19 2049  LIPASE 38   No results for input(s): AMMONIA in the last 168 hours. CBC: Recent Labs  Lab 03/16/19 2049 03/18/19 0601  WBC 6.2 4.8  HGB 12.8 11.5*  HCT 39.9 36.1  MCV 83.3 83.8  PLT 276 211   Cardiac Enzymes: No results for input(s): CKTOTAL, CKMB, CKMBINDEX, TROPONINI in the last 168  hours. BNP: Invalid input(s): POCBNP CBG: No results for input(s): GLUCAP in the last  168 hours. D-Dimer No results for input(s): DDIMER in the last 72 hours. Hgb A1c No results for input(s): HGBA1C in the last 72 hours. Lipid Profile No results for input(s): CHOL, HDL, LDLCALC, TRIG, CHOLHDL, LDLDIRECT in the last 72 hours. Thyroid function studies No results for input(s): TSH, T4TOTAL, T3FREE, THYROIDAB in the last 72 hours.  Invalid input(s): FREET3 Anemia work up Recent Labs    03/18/19 1035 03/19/19 0425  VITAMINB12  --  469  FOLATE 15.3  --   FERRITIN 49  --   TIBC 382  --   IRON 160  --   RETICCTPCT 1.1  --    Urinalysis    Component Value Date/Time   COLORURINE AMBER (A) 03/16/2019 2046   APPEARANCEUR HAZY (A) 03/16/2019 2046   LABSPEC 1.009 03/16/2019 2046   PHURINE 7.0 03/16/2019 2046   GLUCOSEU NEGATIVE 03/16/2019 2046   HGBUR LARGE (A) 03/16/2019 2046   [place] NEGATIVE 03/16/2019 2046   [place] NEGATIVE 03/16/2019 2046   PROTEINUR 30 (A) 03/16/2019 2046   UROBILINOGEN 1.0 08/29/2014 2035   NITRITE NEGATIVE 03/16/2019 2046   LEUKOCYTESUR NEGATIVE 03/16/2019 2046   Sepsis Labs Invalid input(s): PROCALCITONIN,  WBC,  LACTICIDVEN Microbiology Recent Results (from the past 240 hour(s))  SARS CORONAVIRUS 2 (TAT 6-24 HRS) Nasopharyngeal Nasopharyngeal Swab     Status: None   Collection Time: 03/17/19 10:29 AM   Specimen: Nasopharyngeal Swab  Result Value Ref Range Status   SARS Coronavirus 2 NEGATIVE NEGATIVE Final    Comment: (NOTE) SARS-CoV-2 target nucleic acids are NOT DETECTED. The SARS-CoV-2 RNA is generally detectable in upper and lower respiratory specimens during the acute phase of infection. Negative results do not preclude SARS-CoV-2 infection, do not rule out co-infections with other pathogens, and should not be used as the sole basis for treatment or other patient management decisions. Negative results must be combined with clinical  observations, patient history, and epidemiological information. The expected result is Negative. Fact Sheet for Patients: SugarRoll.be Fact Sheet for Healthcare Providers: https://www.woods-mathews.com/ This test is not yet approved or cleared by the [place] FDA and  has been authorized for detection and/or diagnosis of SARS-CoV-2 by FDA under an Emergency Use Authorization (EUA). This EUA will remain  in effect (meaning this test can be used) for the duration of the COVID-19 declaration under Section 56 4(b)(1) of the Act, 21 U.S.C. section 360bbb-3(b)(1), unless the authorization is terminated or revoked sooner. Performed at [facility], [place] [address] [place], [place] [address]      Time coordinating discharge in minutes: 66  SIGNED:   Debbe Odea, MD  [facility] 03/19/2019, 12:47 PM Pager   If 7PM-7AM, please contact night-coverage [email or web address] Password [email or web address]

## 2019-03-19 NOTE — Anesthesia Postprocedure Evaluation (Signed)
Anesthesia Post Note  Patient: WIKTORIA HEMRICK  Procedure(s) Performed: ESOPHAGOGASTRODUODENOSCOPY (EGD) WITH PROPOFOL (N/A ) COLONOSCOPY WITH PROPOFOL (N/A ) POLYPECTOMY     Patient location during evaluation: Endoscopy Anesthesia Type: MAC Level of consciousness: awake and alert Pain management: pain level controlled Vital Signs Assessment: post-procedure vital signs reviewed and stable Respiratory status: spontaneous breathing, nonlabored ventilation, respiratory function stable and patient connected to nasal cannula oxygen Cardiovascular status: blood pressure returned to baseline and stable Postop Assessment: no apparent nausea or vomiting Anesthetic complications: no    Last Vitals:  Vitals:   03/19/19 0752 03/19/19 0928  BP: (!) 159/94 (!) 122/52  Pulse: 60 60  Resp: 14 18  Temp: 36.5 C (!) 36.3 C  SpO2: 96% 98%    Last Pain:  Vitals:   03/19/19 0928  TempSrc: Temporal  PainSc: 3                  Barnet Glasgow

## 2019-03-19 NOTE — Op Note (Addendum)
[facility] Patient Name: Stephanie Yates Procedure Date : 03/19/2019 MRN: [id number] Attending MD: Otis Brace , MD Date of Birth: Dec 16, 1945 CSN: [id number] Age: 73 Admit Type: Inpatient Procedure:                Colonoscopy Indications:              Abdominal pain in the right upper quadrant, Heme                            positive stool, Weight loss Providers:                Otis Brace, MD, Angus Seller, William Dalton, Technician Referring MD:              Medicines:                Sedation Administered by an Anesthesia Professional Complications:            No immediate complications. Estimated Blood Loss:     Estimated blood loss was minimal. Procedure:                Pre-Anesthesia Assessment:                           - Prior to the procedure, a History and Physical                            was performed, and patient medications and                            allergies were reviewed. The patient's tolerance of                            previous anesthesia was also reviewed. The risks                            and benefits of the procedure and the sedation                            options and risks were discussed with the patient.                            All questions were answered, and informed consent                            was obtained. Prior Anticoagulants: The patient                            last took Eliquis (apixaban) 3 days prior to the                            procedure and last took heparin on the day of the  procedure. ASA Grade Assessment: IV - A patient                            with severe systemic disease that is a constant                            threat to life. After reviewing the risks and                            benefits, the patient was deemed in satisfactory                            condition to undergo the procedure.                           -  Prior to the procedure, a History and Physical                            was performed, and patient medications and                            allergies were reviewed. The patient's tolerance of                            previous anesthesia was also reviewed. The risks                            and benefits of the procedure and the sedation                            options and risks were discussed with the patient.                            All questions were answered, and informed consent                            was obtained. Prior Anticoagulants: The patient                            last took Eliquis (apixaban) 3 days prior to the                            procedure and last took heparin on the day of the                            procedure. ASA Grade Assessment: IV - A patient                            with severe systemic disease that is a constant                            threat to life. After reviewing the risks and  benefits, the patient was deemed in satisfactory                            condition to undergo the procedure.                           After obtaining informed consent, the colonoscope                            was passed under direct vision. Throughout the                            procedure, the patient's blood pressure, pulse, and                            oxygen saturations were monitored continuously. The                            [id number]-H190DL ([id number] Olympus pediatric colonscope                            was introduced through the anus and advanced to the                            the cecum, identified by appendiceal orifice and                            ileocecal valve. The colonoscopy was performed                            without difficulty. The patient tolerated the                            procedure well. The quality of the bowel                            preparation was good except the rectum was  fair. Scope In: 8:50:09 AM Scope Out: 9:22:13 AM Scope Withdrawal Time: 0 hours 12 minutes 54 seconds  Total Procedure Duration: 0 hours 32 minutes 4 seconds  Findings:      The perianal and digital rectal examinations were normal.      A 8 mm polyp was found in the ascending colon. The polyp was sessile.       The polyp was removed with a cold snare. Resection and retrieval were       complete.      Normal mucosa was found at the hepatic flexure.      Four sessile polyps were found in the transverse colon. The polyps were       4 to 5 mm in size. These polyps were removed with a cold snare.       Resection and retrieval were complete.      A 5 mm polyp was found in the descending colon. The polyp was sessile.       The polyp was removed with a cold snare. Resection and retrieval were  complete.      Two sessile polyps were found in the rectum. The polyps were small in       size. These polyps were removed with a cold snare. Resection was       complete, but the polyp tissue was not retrieved.      Scattered large-mouthed diverticula were found in the entire colon.      The retroflexed view of the distal rectum and anal verge was normal and       showed no anal or rectal abnormalities. Impression:               - One 8 mm polyp in the ascending colon, removed                            with a cold snare. Resected and retrieved.                           - Normal mucosa at the hepatic flexure.                           - Four 4 to 5 mm polyps in the transverse colon,                            removed with a cold snare. Resected and retrieved.                           - One 5 mm polyp in the descending colon, removed                            with a cold snare. Resected and retrieved.                           - Two small polyps in the rectum, removed with a                            cold snare. Complete resection. Polyp tissue not                            retrieved.                            - Diverticulosis in the entire examined colon. Recommendation:           - Return patient to hospital ward for ongoing care.                           - Cardiac diet.                           - Continue present medications.                           - Await pathology results. Procedure Code(s):        --- Professional ---  45385, Colonoscopy, flexible; with removal of                            tumor(s), polyp(s), or other lesion(s) by snare                            technique Diagnosis Code(s):        --- Professional ---                           K63.5, Polyp of colon                           K62.1, Rectal polyp                           R10.11, Right upper quadrant pain                           R19.5, Other fecal abnormalities                           R63.4, Abnormal weight loss                           K57.30, Diverticulosis of large intestine without                            perforation or abscess without bleeding CPT copyright 2019 American Medical Association. All rights reserved. The codes documented in this report are preliminary and upon coder review may  be revised to meet current compliance requirements. Otis Brace, MD Otis Brace, MD 03/19/2019 9:41:30 AM Number of Addenda: 0

## 2019-03-19 NOTE — Progress Notes (Signed)
[facility] Progress Note  Stephanie Yates 73 y.o. Feb 12, 1946  CC: Recurrent epigastric and right upper quadrant pain   Subjective: She continues to have right upper quadrant abdominal pain.  Drank 3/4 of the prep.  Heparin drip on hold.  ROS : Negative for acute chest pain and shortness of breath.  Objective: Vital signs in last 24 hours: Vitals:   03/19/19 0738 03/19/19 0752  BP: (!) 141/93 (!) 159/94  Pulse: 60 60  Resp: 18 14  Temp: (!) 97.5 F (36.4 C) 97.7 F (36.5 C)  SpO2: 97% 96%    Physical Exam:  General.  Elderly appearing patient.  Not in acute distress Abdomen.  Right upper quadrant tenderness to palpation, soft, bowel sounds present.  No peritoneal signs Neuro.  Alert/oriented x3  Lab Results: Recent Labs    03/18/19 0601 03/19/19 0425  NA 137 138  K 3.5 4.6  CL 102 104  CO2 26 26  GLUCOSE 96 86  BUN 48* 37*  CREATININE 2.19* 1.81*  CALCIUM 8.6* 8.7*  MG 2.7* 2.3   Recent Labs    03/16/19 2049 03/18/19 0601  AST 147* 132*  ALT 75* 72*  ALKPHOS 81 70  BILITOT 1.3* 0.9  PROT 8.5* 6.9  ALBUMIN 3.5 2.9*   Recent Labs    03/16/19 2049 03/18/19 0601  WBC 6.2 4.8  HGB 12.8 11.5*  HCT 39.9 36.1  MCV 83.3 83.8  PLT 276 211   Recent Labs    03/18/19 0601  LABPROT 19.6*  INR 1.7*      Assessment/Plan: -Right upper quadrant and epigastric abdominal pain.  CT scan showed cirrhosis otherwise no acute changes.  Ultrasound showed gallbladder wall thickening with questionable heterogenous masslike structure at the gallbladder fundus.   -Chronic abdominal pain. -Atrial fibrillation.  Eliquis on hold.  Heparin drip on hold since 5 in the morning. -Cardiomyopathy with EF of only 15%. -Abnormal LFTs.  Mild elevated AST and ALT.  Could be from underlying cirrhosis -Occult blood positive.  No anemia. -Abnormal CT scan showing thickening of gastric fundus.  History of ulcer disease.  Recent EGD in July 2020 showed no significant  abnormality in the cardia/fundus.  Recommendations ------------------------- -Appreciate surgery input.  Recommended follow-up imaging.  Also not a good candidate from surgical standpoint because of ischemic cardiomyopathy and underlying cirrhosis.  -Proceed with EGD and colonoscopy for intermittent abdominal pain and weight loss as well as occult blood positive stool.  Patient is somewhat at high risk for procedure   Risks (bleeding, infection, bowel perforation that could require surgery, sedation-related changes in cardiopulmonary systems), benefits (identification and possible treatment of source of symptoms, exclusion of certain causes of symptoms), and alternatives (watchful waiting, radiographic imaging studies, empiric medical treatment)  were explained to patient in detail and patient wishes to proceed.   Otis Brace MD, [place] 03/19/2019, 8:10 AM  Contact #  [phone]

## 2019-03-19 NOTE — Progress Notes (Signed)
Patient started remainder of bowel prep at 0505.  Heparin stopped.

## 2019-03-19 NOTE — Brief Op Note (Signed)
03/16/2019 - 03/19/2019  9:41 AM  PATIENT:  Stephanie Yates  73 y.o. female  PRE-OPERATIVE DIAGNOSIS:  upper abdominal pain, cirrhosis, abnormal CT  POST-OPERATIVE DIAGNOSIS:  EGD - gastric polyps, esophagitis Colon - diverticulosis, transverse colon, descending colon, ascending, rectal colon polyps removed using cold snare  PROCEDURE:  Procedure(s): ESOPHAGOGASTRODUODENOSCOPY (EGD) WITH PROPOFOL (N/A) COLONOSCOPY WITH PROPOFOL (N/A) POLYPECTOMY  SURGEON:  Surgeon(s) and Role:    * Sanvi Ehler, MD - Primary  Findings ---------- -EGD showed LA grade C esophagitis, 3 small inflammatory appearing polyp in the gastric body as well as abnormal mucosa with thickened fold and prepyloric area. -Colonoscopy showed 8 small polyps which were removed with cold snare, scattered diverticulosis.  No evidence of any lesion around hepatic flexure or the proximal transverse colon.   Recommendations ------------------------- -Increase Protonix to twice a day for 2 weeks.  Add Carafate twice a day for 2 weeks. -Follow biopsy results. -May benefit from repeat EGD in next 3 to 6 months but do not recommend repeat colonoscopy because of age and underlying severe comorbidities. -Recommend repeat ultrasound in 3 to 6 months for follow-up on gallbladder lesion. -No further inpatient GI work-up planned.  -Okay to resume heparin drip after 6 hours.  If no bleeding, okay to put put her back on oral anticoagulation tomorrow  -GI will sign off.  Follow-up with Dr. Therisa Doyne in 6 to 8 weeks after discharge.  I have discussed case with Dr. Therisa Doyne.  Otis Brace MD, [place] 03/19/2019, 9:44 AM  Contact #  [phone]

## 2019-03-19 NOTE — Transfer of Care (Signed)
Immediate Anesthesia Transfer of Care Note  Patient: Stephanie Yates  Procedure(s) Performed: ESOPHAGOGASTRODUODENOSCOPY (EGD) WITH PROPOFOL (N/A ) COLONOSCOPY WITH PROPOFOL (N/A ) POLYPECTOMY  Patient Location: PACU and Endoscopy Unit  Anesthesia Type:MAC  Level of Consciousness: drowsy, patient cooperative and responds to stimulation  Airway & Oxygen Therapy: Patient Spontanous Breathing  Post-op Assessment: Report given to RN and Post -op Vital signs reviewed and stable  Post vital signs: Reviewed and stable  Last Vitals:  Vitals Value Taken Time  BP 122/52 03/19/19 0927  Temp    Pulse 60 03/19/19 0928  Resp 21 03/19/19 0928  SpO2 100 % 03/19/19 0928  Vitals shown include unvalidated device data.  Last Pain:  Vitals:   03/19/19 0752  TempSrc: Temporal  PainSc: 3       Patients Stated Pain Goal: 2 (74/25/95 6387)  Complications: No apparent anesthesia complications

## 2019-03-20 ENCOUNTER — Encounter (HOSPITAL_COMMUNITY): Payer: Self-pay | Admitting: Gastroenterology

## 2019-03-20 LAB — SURGICAL PATHOLOGY

## 2019-03-20 LAB — BASIC METABOLIC PANEL
Anion gap: 8 (ref 5–15)
BUN: 28 mg/dL — ABNORMAL HIGH (ref 8–23)
CO2: 23 mmol/L (ref 22–32)
Calcium: 8.4 mg/dL — ABNORMAL LOW (ref 8.9–10.3)
Chloride: 106 mmol/L (ref 98–111)
Creatinine, Ser: 1.65 mg/dL — ABNORMAL HIGH (ref 0.44–1.00)
GFR calc Af Amer: 35 mL/min — ABNORMAL LOW (ref >60)
GFR calc non Af Amer: 30 mL/min — ABNORMAL LOW (ref >60)
Glucose, Bld: 81 mg/dL (ref 70–99)
Potassium: 5 mmol/L (ref 3.5–5.1)
Sodium: 137 mmol/L (ref 135–145)

## 2019-03-20 NOTE — Progress Notes (Signed)
Pt c/o nausea last night. Zofran given with scheduled meds. Pt states relief. Will continue to monitor.

## 2019-03-20 NOTE — Discharge Instructions (Signed)
Fried foods, caffeine, acidic drinks such as juices and sodas, tea and coffee, alcohol and avoid smoking. You should see your primary physician in 1 to 2 weeks and have the following blood work done: Bmet Follow-up with GI in 6 to 8 weeks. You will need an ultrasound of your gallbladder in 3 to 6 months.  Either your primary care physician or your GI doctor can order this Your Eliquis dosage has been reduced because your kidney function is poor.   Bland Diet A bland diet consists of foods that are often soft and do not have a lot of fat, fiber, or extra seasonings. Foods without fat, fiber, or seasoning are easier for the body to digest. They are also less likely to irritate your mouth, throat, stomach, and other parts of your digestive system. A bland diet is sometimes called a BRAT diet. What is my plan? Your health care provider or food and nutrition specialist (dietitian) may recommend specific changes to your diet to prevent symptoms or to treat your symptoms. These changes may include:  Eating small meals often.  Cooking food until it is soft enough to chew easily.  Chewing your food well.  Drinking fluids slowly.  Not eating foods that are very spicy, sour, or fatty.  Not eating citrus fruits, such as oranges and grapefruit. What do I need to know about this diet?  Eat a variety of foods from the bland diet food list.  Do not follow a bland diet longer than needed.  Ask your health care provider whether you should take vitamins or supplements. What foods can I eat? Grains  Hot cereals, such as cream of wheat. Rice. Bread, crackers, or tortillas made from refined white flour. Vegetables Canned or cooked vegetables. Mashed or boiled potatoes. Fruits  Bananas. Applesauce. Other types of cooked or canned fruit with the skin and seeds removed, such as canned peaches or pears. Meats and other proteins  Scrambled eggs. Creamy peanut butter or other nut butters. Lean,  well-cooked meats, such as chicken or fish. Tofu. Soups or broths. Dairy Low-fat dairy products, such as milk, cottage cheese, or yogurt. Beverages  Water. Herbal tea. Apple juice. Fats and oils Mild salad dressings. Canola or olive oil. Sweets and desserts Pudding. Custard. Fruit gelatin. Ice cream. The items listed above may not be a complete list of recommended foods and beverages. Contact a dietitian for more options. What foods are not recommended? Grains Whole grain breads and cereals. Vegetables Raw vegetables. Fruits Raw fruits, especially citrus, berries, or dried fruits. Dairy Whole fat dairy foods. Beverages Caffeinated drinks. Alcohol. Seasonings and condiments Strongly flavored seasonings or condiments. Hot sauce. Salsa. Other foods Spicy foods. Fried foods. Sour foods, such as pickled or fermented foods. Foods with high sugar content. Foods high in fiber. The items listed above may not be a complete list of foods and beverages to avoid. Contact a dietitian for more information. Summary  A bland diet consists of foods that are often soft and do not have a lot of fat, fiber, or extra seasonings.  Foods without fat, fiber, or seasoning are easier for the body to digest.  Check with your health care provider to see how long you should follow this diet plan. It is not meant to be followed for long periods. This information is not intended to replace advice given to you by your health care provider. Make sure you discuss any questions you have with your health care provider. Document Released: 10/05/2015 Document Revised: 07/12/2017  Document Reviewed: 07/12/2017 Elsevier Patient Education  2020 [place] were cared for by a hospitalist during your hospital stay. If you have any questions about your discharge medications or the care you received while you were in the hospital after you are discharged, you can call the unit and asked to speak with the  hospitalist on call if the hospitalist that took care of you is not available. Once you are discharged, your primary care physician will handle any further medical issues.   Please note that NO REFILLS for any discharge medications will be authorized once you are discharged, as it is imperative that you return to your primary care physician (or establish a relationship with a primary care physician if you do not have one) for your aftercare needs so that they can reassess your need for medications and monitor your lab values.  Please take all your medications with you for your next visit with your Primary MD. Please ask your Primary MD to get all Hospital records sent to his/her office. Please request your Primary MD to go over all hospital test results at the follow up.   If you experience worsening of your admission symptoms, develop shortness of breath, chest pain, suicidal or homicidal thoughts or a life threatening emergency, you must seek medical attention immediately by calling 911 or calling your MD.   Dennis Bast must read the complete instructions/literature along with all the possible adverse reactions/side effects for all the medicines you take including new medications that have been prescribed to you. Take new medicines after you have completely understood and accpet all the possible adverse reactions/side effects.    Do not drive when taking pain medications or sedatives.     Do not take more than prescribed Pain, Sleep and Anxiety Medications   If you have smoked or chewed Tobacco in the last 2 yrs please stop. Stop any regular alcohol  and or recreational drug use.   Wear Seat belts while driving.  Information on my medicine - ELIQUIS (apixaban)  This medication education was reviewed with me or my healthcare representative as part of my discharge preparation.    Why was Eliquis prescribed for you? Eliquis was prescribed for you to reduce the risk of a blood clot forming that can  cause a stroke if you have a medical condition called atrial fibrillation (a type of irregular heartbeat).  What do You need to know about Eliquis ? Take your Eliquis TWICE DAILY - one tablet in the morning and one tablet in the evening with or without food. If you have difficulty swallowing the tablet whole please discuss with your pharmacist how to take the medication safely.  Take Eliquis exactly as prescribed by your doctor and DO NOT stop taking Eliquis without talking to the doctor who prescribed the medication.  Stopping may increase your risk of developing a stroke.  Refill your prescription before you run out.  After discharge, you should have regular check-up appointments with your healthcare provider that is prescribing your Eliquis.  In the future your dose may need to be changed if your kidney function or weight changes by a significant amount or as you get older.  What do you do if you miss a dose? If you miss a dose, take it as soon as you remember on the same day and resume taking twice daily.  Do not take more than one dose of ELIQUIS at the same time to make up a missed dose.  Important  Safety Information A possible side effect of Eliquis is bleeding. You should call your healthcare provider right away if you experience any of the following: ? Bleeding from an injury or your nose that does not stop. ? Unusual colored urine (red or dark brown) or unusual colored stools (red or black). ? Unusual bruising for unknown reasons. ? A serious fall or if you hit your head (even if there is no bleeding).  Some medicines may interact with Eliquis and might increase your risk of bleeding or clotting while on Eliquis. To help avoid this, consult your healthcare provider or pharmacist prior to using any new prescription or non-prescription medications, including herbals, vitamins, non-steroidal anti-inflammatory drugs (NSAIDs) and supplements.  This website has more information on  Eliquis (apixaban): http://[email or web address].eliquis.com/eliquis/home

## 2019-03-20 NOTE — Progress Notes (Signed)
PROGRESS NOTE    Stephanie MUNOZ  OEU:[id number] DOB: [birth year] DOA: 03/16/2019 PCP: Josetta Huddle, MD    Brief Narrative:   Stephanie Yates is a 73 y.o. female with medical history significant of chronic anemia, paroxysmal atrial fibrillation on Eliquis, NICM with EF 15% s/p ICD,CKD stage IV, hypertension, hyperlipidemia; who presents with complaints of epigastric abdominal pain over the last 2 weeks.  This only the patient's or hospitalization in the last 3 months.  Previously evaluated with EGD by Dr. Michail Sermon revealing linear areas of desquamation mucosa of the esophagus and congested.  Gastric antrum was noted to be erythematous, eroded inflamed, and nodular which biopsies were taken.  Each time patient reports that she comes to the hospital feels a little better and symptoms return.  She describes the pain is achy and we will radiate across her upper stomach.  She reports throwing up after eating most foods and even some liquids or medicines.  Daughter makes note that the patient has been eating lots of fried/greasy foods, going to [employer], and drinking sodas wondering why her stomach hurts.  She already had a virtual GI appointment set up for the 22nd of this month.   Associated symptoms include lightheadedness, dysuria, hematuria, intermittent shortness of breath, and chronically has chills.  The doctor had given her some medication for constipation that has helped her have more regular bowel movements.  Denies having any fever, chest pain, diarrhea, or significant leg swelling.  Upon admission patient was noted to be afebrile and hemodynamically stable.  Labs significant for potassium 2.9, BUN 60, creatinine 2.94, AST 147, ALT 75, total bilirubin 1.3, and all other labs relatively within normal limits.  CT scan of the abdomen and pelvis revealed no acute cause for patient's symptoms, hepatic cirrhosis, and nonobstructing bilateral nephrolithiasis.  Gallbladder wall thickening for which a HIDA  scan was recommended.  Gastroenterology was consulted. Patient was given 500 ml of normal saline IV fluids, 40 mg of potassium chloride, 2 g of magnesium sulfate, GI cocktail, Zofran, and morphine.  [facility] called to admit.  Assessment & Plan:   Principal Problem:   Epigastric abdominal pain Active Problems:   Hypokalemia   Transaminitis   PAF (paroxysmal atrial fibrillation) (HCC)   ICD (implantable cardioverter-defibrillator), dual, [facility]   Chronic anticoagulation   Prolonged QT interval   Acute kidney injury superimposed on chronic kidney disease (HCC)   Nausea and vomiting   Right adrenal mass ([place]) possible adenoma   Nephrolithiasis   Esophagitis Patient presenting with progressive nausea/vomiting and abdominal discomfort.  Underwent EGD on 03/20/2019 with findings of LA grade C esophagitis, no varices and 3 gastric polyps. --On FL D, will advance to soft diet today --Continue PPI twice daily x2 weeks --Carafate x 2 weeks --Follow-up EGD in 3-6 months per GI --Supportive care, antiemetics  Colon polyps Underwent colonoscopy on 03/19/2019 with several polyps noted on colonoscopy, resected. --Pending pathology  Gallbladder lesion/mass Patient with history of ultrasound previously with heterogeneous masslike structure in gallbladder fundus.  HIDA scan negative for cholecystitis.  Unable to obtain MRI secondary to AICD in place.  General surgery consulted and recommended follow-up ultrasound in 3 months.  Surgical candidate given her history of V. tach and EF 15%.  Acute renal failure on CKD stage III/IV Creatinine peaked at a high of 2.94 during hospitalization.  Plan 1.7-2.2 over the past year.  Patient's diuretics were held and she was supported with IV fluid hydration. --Cr 2.94-->1.81-->1.65 --Continue to monitor renal function  daily --Okay to resume home diuretics following discharge.  Hx VT --s/p AICD --Goal potassium 4.0 or greater and magnesium 2.0 or  greater  Proximal atrial fibrillation CHA2DS2-VASc Score = 4.  Continue amiodarone, carvedilol and Eliquis.  Nonischemic cardiomyopathy, severe EF 15%.  Continue amiodarone, mexiletine, carvedilol. Currently holding home furosemide 80 mg p.o. daily and metolazone 2.5 mg prn for AKI.  Plan to resume diuretics following discharge.  Normocytic anemia Hemoglobin range 11-13.  Stable.   DVT prophylaxis: Eliquis Code Status: Full code Family Communication: None Disposition Plan: Continue inpatient hospitalization, advancing diet, hopeful for discharge home tomorrow   Consultants:   [facility]  Procedures:  EGD - LA Grade C esophagitis. - Three gastric polyps. - Congested, inflamed and tight circumferentially folded mucosa in the prepyloric region of the stomach. - Normal duodenal bulb, first portion of the duodenum and second portion of the duodenum. - No specimens collected.  colonoscopy 03/20/2019 - One 8 mm polyp in the ascending colon, removed with a cold snare. Resected and retrieved. - Normal mucosa at the hepatic flexure. - Four 4 to 5 mm polyps in the transverse colon, removed with a cold snare. Resected and retrieved. - One 5 mm polyp in the descending colon, removed with a cold snare. Resected and retrieved. - Two small polyps in the rectum, removed with a cold snare. Complete resection. Polyp tissue not retrieved. - Diverticulosis in the entire examined colon. - Await pathology results.  Antimicrobials:  none   Subjective: Patient seen and examined at bedside, had significant nausea with episode of vomiting yesterday.  Diet was de-escalated to full liquids.  Feels slightly better today and willing to advance diet slightly more this morning.  Continues with some abdominal discomfort, although improved.  No other complaints or concerns at this time.  Denies headache, no dizziness, no fever/chills/night sweats, no nausea/vomiting, no chest pain, no palpitations, no  shortness of breath, no weakness, no fatigue, no paresthesias.  No acute events overnight per nursing staff.  Objective: Vitals:   03/20/19 0042 03/20/19 0525 03/20/19 0819 03/20/19 1308  BP: 119/65 122/60 102/61 114/65  Pulse: 62 (!) 59 64 (!) 59  Resp: 18 18  20   Temp: 98.2 F (36.8 C) 98.3 F (36.8 C)  98.5 F (36.9 C)  TempSrc: Oral Oral  Oral  SpO2: 93% 94%  97%  Weight:  59.9 kg    Height:        Intake/Output Summary (Last 24 hours) at 03/20/2019 1535 Last data filed at 03/20/2019 1300 Gross per 24 hour  Intake 720 ml  Output 1000 ml  Net -280 ml   Filed Weights   03/18/19 0342 03/19/19 0346 03/20/19 0525  Weight: 57 kg 58.2 kg 59.9 kg    Examination:  General exam: Appears calm and comfortable  Respiratory system: Clear to auscultation. Respiratory effort normal. Cardiovascular system: S1 & S2 heard, RRR. No JVD, murmurs, rubs, gallops or clicks. No pedal edema. Gastrointestinal system: Abdomen is nondistended, soft, mild epigastric tenderness,. No organomegaly or masses felt. Normal bowel sounds heard. Central nervous system: Alert and oriented. No focal neurological deficits. Extremities: Symmetric 5 x 5 power. Skin: No rashes, lesions or ulcers Psychiatry: Judgement and insight appear normal. Mood & affect appropriate.     Data Reviewed: I have personally reviewed following labs and imaging studies  CBC: Recent Labs  Lab 03/16/19 2049 03/18/19 0601  WBC 6.2 4.8  HGB 12.8 11.5*  HCT 39.9 36.1  MCV 83.3 83.8  PLT 276 211  Basic Metabolic Panel: Recent Labs  Lab 03/16/19 2049 03/18/19 0601 03/19/19 0425 03/20/19 0453  NA 136 137 138 137  K 2.9* 3.5 4.6 5.0  CL 97* 102 104 106  CO2 28 26 26 23   GLUCOSE 115* 96 86 81  BUN 60* 48* 37* 28*  CREATININE 2.94* 2.19* 1.81* 1.65*  CALCIUM 9.2 8.6* 8.7* 8.4*  MG 2.9* 2.7* 2.3  --    GFR: Estimated Creatinine Clearance: 24.4 mL/min (A) (by C-G formula based on SCr of 1.65 mg/dL (H)). Liver  Function Tests: Recent Labs  Lab 03/16/19 2049 03/18/19 0601  AST 147* 132*  ALT 75* 72*  ALKPHOS 81 70  BILITOT 1.3* 0.9  PROT 8.5* 6.9  ALBUMIN 3.5 2.9*   Recent Labs  Lab 03/16/19 2049  LIPASE 38   No results for input(s): AMMONIA in the last 168 hours. Coagulation Profile: Recent Labs  Lab 03/18/19 0601  INR 1.7*   Cardiac Enzymes: No results for input(s): CKTOTAL, CKMB, CKMBINDEX, TROPONINI in the last 168 hours. BNP (last 3 results) No results for input(s): PROBNP in the last 8760 hours. HbA1C: No results for input(s): HGBA1C in the last 72 hours. CBG: No results for input(s): GLUCAP in the last 168 hours. Lipid Profile: No results for input(s): CHOL, HDL, LDLCALC, TRIG, CHOLHDL, LDLDIRECT in the last 72 hours. Thyroid Function Tests: No results for input(s): TSH, T4TOTAL, FREET4, T3FREE, THYROIDAB in the last 72 hours. Anemia Panel: Recent Labs    03/18/19 1035 03/19/19 0425  VITAMINB12  --  469  FOLATE 15.3  --   FERRITIN 49  --   TIBC 382  --   IRON 160  --   RETICCTPCT 1.1  --    Sepsis Labs: No results for input(s): PROCALCITON, LATICACIDVEN in the last 168 hours.  Recent Results (from the past 240 hour(s))  SARS CORONAVIRUS 2 (TAT 6-24 HRS) Nasopharyngeal Nasopharyngeal Swab     Status: None   Collection Time: 03/17/19 10:29 AM   Specimen: Nasopharyngeal Swab  Result Value Ref Range Status   SARS Coronavirus 2 NEGATIVE NEGATIVE Final    Comment: (NOTE) SARS-CoV-2 target nucleic acids are NOT DETECTED. The SARS-CoV-2 RNA is generally detectable in upper and lower respiratory specimens during the acute phase of infection. Negative results do not preclude SARS-CoV-2 infection, do not rule out co-infections with other pathogens, and should not be used as the sole basis for treatment or other patient management decisions. Negative results must be combined with clinical observations, patient history, and epidemiological information. The  expected result is Negative. Fact Sheet for Patients: SugarRoll.be Fact Sheet for Healthcare Providers: https://www.woods-mathews.com/ This test is not yet approved or cleared by the [place] FDA and  has been authorized for detection and/or diagnosis of SARS-CoV-2 by FDA under an Emergency Use Authorization (EUA). This EUA will remain  in effect (meaning this test can be used) for the duration of the COVID-19 declaration under Section 56 4(b)(1) of the Act, 21 U.S.C. section 360bbb-3(b)(1), unless the authorization is terminated or revoked sooner. Performed at [facility], [place] [address] [place], [place] [address]          Radiology Studies: No results found.      Scheduled Meds:  amiodarone  200 mg Oral Daily   apixaban  2.5 mg Oral BID   carvedilol  3.125 mg Oral BID WC   feeding supplement (ENSURE ENLIVE)  237 mL Oral Daily   ferrous gluconate  324 mg Oral BID  magnesium oxide  400 mg Oral Daily   mexiletine  200 mg Oral BID   pantoprazole (PROTONIX) IV  40 mg Intravenous Q12H   sodium chloride flush  3 mL Intravenous Q12H   sucralfate  1 g Oral TID WC & HS   Continuous Infusions:   LOS: 3 days    Time spent: 39 minutes spent on chart review, discussion with nursing staff, consultants, updating family and interview/physical exam; more than 50% of that time was spent in counseling and/or coordination of care.    Neriah Brott J [place] DO [facility] Pager [phone]  If 7PM-7AM, please contact night-coverage [email or web address] Password [facility] 03/20/2019, 3:35 PM

## 2019-03-20 NOTE — Care Management Important Message (Signed)
Important Message  Patient Details  Name: LAURRIE TOPPIN MRN: [id number] Date of Birth: [birth year]   Medicare Important Message Given:  Yes     Shelda Altes 03/20/2019, 1:46 PM

## 2019-03-20 NOTE — Plan of Care (Signed)
  Problem: Safety: Goal: Ability to remain free from injury will improve Outcome: Progressing   Problem: Activity: Goal: Capacity to carry out activities will improve Outcome: Progressing   

## 2019-03-21 ENCOUNTER — Ambulatory Visit (INDEPENDENT_AMBULATORY_CARE_PROVIDER_SITE_OTHER): Payer: Medicare Other | Admitting: *Deleted

## 2019-03-21 DIAGNOSIS — I428 Other cardiomyopathies: Secondary | ICD-10-CM

## 2019-03-21 LAB — GLUCOSE, CAPILLARY: Glucose-Capillary: 113 mg/dL — ABNORMAL HIGH (ref 70–99)

## 2019-03-21 MED ORDER — PANTOPRAZOLE SODIUM 40 MG PO TBEC: 40.0000 mg | DELAYED_RELEASE_TABLET | Freq: Two times a day (BID) | ORAL | 0 refills | Status: DC

## 2019-03-21 MED ORDER — ONDANSETRON 4 MG PO TBDP: 4.0000 mg | ORAL_TABLET | Freq: Three times a day (TID) | ORAL | 0 refills | Status: DC | PRN

## 2019-03-21 MED ORDER — PANTOPRAZOLE SODIUM 40 MG PO TBEC: 40.0000 mg | DELAYED_RELEASE_TABLET | Freq: Every day | ORAL | 0 refills | Status: DC

## 2019-03-21 NOTE — Progress Notes (Signed)
CSW called patient's daughter Magda Paganini at [phone] she reports she will be at the hospital to pick up patient in 15-20 minutes.   [place], [place]

## 2019-03-21 NOTE — Evaluation (Signed)
Physical Therapy Evaluation Patient Details Name: Stephanie Yates MRN: [id number] DOB: 01-02-1946 Today's Date: 03/21/2019   History of Present Illness  Pt is a 73 y/o female admitted secondary to abdominal pain. Pt is s/p EGD and found to have esophagitis. Pt is also s/p colonoscopy and had resection of polyps. PMH includes a fib, s/p ICD, CKD, HTN.   Clinical Impression  Pt admitted secondary to problem above with deficits below. Pt requiring min to min guard A for mobility tasks using RW. Did note instance of R knee buckling and required assist for steadying. Educated about use of RW, however, pt may refuse. Will continue to follow acutely to maximize functional mobility independence and safety.     Follow Up Recommendations Home health PT;Supervision for mobility/OOB    Equipment Recommendations  Rolling walker with 5" wheels    Recommendations for Other Services       Precautions / Restrictions Precautions Precautions: Fall Restrictions Weight Bearing Restrictions: No      Mobility  Bed Mobility Overal bed mobility: Needs Assistance Bed Mobility: Supine to Sit;Sit to Supine     Supine to sit: Supervision Sit to supine: Supervision   General bed mobility comments: Supervision for safety.   Transfers Overall transfer level: Needs assistance Equipment used: Rolling walker (2 wheeled);None Transfers: Sit to/from Stand Sit to Stand: Min assist         General transfer comment: Min A for lift assist and steadying assist. Attempted to stand without AD, however, pt reporting increased weakness.   Ambulation/Gait Ambulation/Gait assistance: Min guard;Min assist Gait Distance (Feet): 100 Feet Assistive device: Rolling walker (2 wheeled) Gait Pattern/deviations: Step-through pattern;Decreased stride length Gait velocity: Decreased   General Gait Details: Slow, unsteady gait. Min guard for steadying throughout when using RW. Pt with one instance of R knee buckling and  required min A for steadying assist.   Stairs            Wheelchair Mobility    Modified Rankin (Stroke Patients Only)       Balance Overall balance assessment: Needs assistance Sitting-balance support: No upper extremity supported;Feet supported Sitting balance-Leahy Scale: Fair     Standing balance support: Bilateral upper extremity supported;During functional activity Standing balance-Leahy Scale: Poor Standing balance comment: Reliant on BUE support                              Pertinent Vitals/Pain Pain Assessment: Faces Faces Pain Scale: Hurts little more Pain Location: abdomen Pain Descriptors / Indicators: Aching Pain Intervention(s): Limited activity within patient's tolerance;Monitored during session;Repositioned    Home Living Family/patient expects to be discharged to:: Private residence Living Arrangements: Alone Available Help at Discharge: Family;Available PRN/intermittently Type of Home: House Home Access: Stairs to enter Entrance Stairs-Rails: Psychiatric nurse of Steps: 2 Home Layout: One level Home Equipment: Cane - single point      Prior Function Level of Independence: Independent               Hand Dominance   Dominant Hand: Right    Extremity/Trunk Assessment   Upper Extremity Assessment Upper Extremity Assessment: Generalized weakness    Lower Extremity Assessment Lower Extremity Assessment: Generalized weakness;RLE deficits/detail RLE Deficits / Details: R knee buckling noted during gait.     Cervical / Trunk Assessment Cervical / Trunk Assessment: Normal  Communication   Communication: No difficulties  Cognition Arousal/Alertness: Awake/alert Behavior During Therapy: WFL for tasks assessed/performed Overall Cognitive  Status: Within Functional Limits for tasks assessed                                        General Comments General comments (skin integrity, edema,  etc.): Educated about DME recommendations for home, however, pt may refuse.     Exercises     Assessment/Plan    PT Assessment Patient needs continued PT services  PT Problem List Decreased strength;Decreased balance;Decreased mobility;Decreased knowledge of use of DME;Decreased knowledge of precautions;Decreased safety awareness       PT Treatment Interventions DME instruction;Gait training;Stair training;Functional mobility training;Therapeutic activities;Therapeutic exercise;Balance training;Patient/family education    PT Goals (Current goals can be found in the Care Plan section)  Acute Rehab PT Goals Patient Stated Goal: to feel better PT Goal Formulation: With patient Time For Goal Achievement: 04/04/19 Potential to Achieve Goals: Good    Frequency Min 3X/week   Barriers to discharge Decreased caregiver support      Co-evaluation               AM-PAC PT "6 Clicks" Mobility  Outcome Measure Help needed turning from your back to your side while in a flat bed without using bedrails?: None Help needed moving from lying on your back to sitting on the side of a flat bed without using bedrails?: None Help needed moving to and from a bed to a chair (including a wheelchair)?: A Little Help needed standing up from a chair using your arms (e.g., wheelchair or bedside chair)?: A Little Help needed to walk in hospital room?: A Little Help needed climbing 3-5 steps with a railing? : A Lot 6 Click Score: 19    End of Session Equipment Utilized During Treatment: Gait belt Activity Tolerance: Patient limited by fatigue Patient left: in bed;with call bell/phone within reach Nurse Communication: Mobility status PT Visit Diagnosis: Unsteadiness on feet (R26.81);Muscle weakness (generalized) (M62.81)    Time: 0539-7673 PT Time Calculation (min) (ACUTE ONLY): 16 min   Charges:   PT Evaluation $PT Eval Moderate Complexity: Tuttle, PT, DPT   Acute Rehabilitation Services  Pager: [phone] Office: [phone]   Rudean Hitt 03/21/2019, 2:45 PM

## 2019-03-21 NOTE — Discharge Summary (Signed)
Physician Discharge Summary  Stephanie Yates XBL:[id number] DOB: [birth year] DOA: 03/16/2019  PCP: Josetta Huddle, MD  Admit date: 03/16/2019 Discharge date: 03/21/2019  Admitted From: Home Disposition:  Home  Recommendations for Outpatient Follow-up:  1. Follow up with PCP in 1 week 2. Follow-up with gastroenterology as scheduled, will need repeat EGD in 3-6 months 3. Discharging home on Carafate and Protonix twice daily x2 weeks, then can continue Protonix once daily 4. Please obtain BMP in one week 5. Needs repeat right upper quadrant ultrasound in 3 months for possible gallbladder lesion per general surgery 6. Please follow up on the following pending results: Colon polyp biopsies  Home Health: Yes, PT/RN Equipment/Devices: Rolling walker  Discharge Condition: Stable CODE STATUS: Full code Diet recommendation: Heart Healthy  History of present illness:  Stephanie Silverthorne McCollumis a 73 y.o.femalewith medical history significant ofchronic anemia,paroxysmal atrial fibrillationon Eliquis, NICM with EF 15% s/p ICD,CKD stage IV, hypertension, hyperlipidemia;who presents with complaints of epigastric abdominal pain over the last 2 weeks. This only the patient's or hospitalization in the last 3 months. Previously evaluated with EGD by Dr. Michail Sermon revealing linear areas of desquamation mucosa of the esophagus and congested. Gastric antrum was noted to be erythematous, eroded inflamed, and nodular which biopsies were taken. Each time patient reports that she comes to the hospital feels a little better and symptoms return. She describes the pain is achy and we will radiate across her upper stomach. She reports throwing up after eating most foods and even some liquids or medicines. Daughter makes note that the patienthas been eating lots of fried/greasy foods,going to [employer],and drinking sodas wondering why her stomach hurts. She already had a [place] appointment set up for the22ndof this  month. Associated symptoms include lightheadedness, dysuria, hematuria, intermittent shortness of breath, and chronically has chills. The doctor had given her some medication for constipation that has helped her have more regular bowel movements. Denies having any fever, chest pain, diarrhea, or significant leg swelling.  Upon admissionpatient was noted to be afebrile and hemodynamically stable. Labs significant for potassium 2.9, BUN 60, creatinine 2.94, AST 147, ALT 75, total bilirubin 1.3, and all other labs relatively within normal limits. CT scan of the abdomen and pelvis revealed no acute cause for patient's symptoms, hepatic cirrhosis, and nonobstructing bilateral nephrolithiasis. Gallbladder wall thickening for which a HIDA scan was recommended. Gastroenterology was consulted.Patient was given500 mlof normal saline IV fluids, 40 mg of potassium chloride, 2 g of magnesium sulfate, GI cocktail, Zofran, and morphine. [facility] called to admit.   Hospital course:  Esophagitis Patient presenting with progressive nausea/vomiting and abdominal discomfort.  Underwent EGD on 03/20/2019 with findings of LA grade C esophagitis, no varices and 3 gastric polyps.  GI recommended PPI twice daily x2 weeks and Carafate x2 weeks; followed by PPI once daily.  Patient was able to transition from clear liquid diet which was slowly advanced to soft in which she was able to tolerate.  Patient will need repeat EGD in 3-6 months.  Follow-up with gastroenterology outpatient.  Continue Zofran outpatient as needed for nausea.  Colon polyps Underwent colonoscopy on 03/19/2019 with several polyps noted on colonoscopy, resected.  Follow-up pathology.  Gallbladder lesion/mass Patient with history of ultrasound previously with heterogeneous masslike structure in gallbladder fundus.  HIDA scan negative for cholecystitis.  Unable to obtain MRI secondary to AICD in place.  General surgery consulted and recommended  follow-up ultrasound in 3 months.  Surgical candidate given her history of V. tach and EF 15%.  Acute renal failure on CKD stage III/IV Creatinine peaked at a high of 2.94 during hospitalization.  Plan 1.7-2.2 over the past year.  Patient's diuretics were held and she was supported with IV fluid hydration.  Creatinine improved from a high of 75.94 on admission to 1.65 at time of discharge. Resume home diuretics following discharge.  Hx VT s/p AICD, outpatient cardiology follow-up.  Proximal atrial fibrillation CHA2DS2-VASc Score = 4.  Continue amiodarone, carvedilol and Eliquis.  Nonischemic cardiomyopathy, severe EF 15%.  Continue amiodarone, mexiletine, carvedilol. Currently holding home furosemide 80 mg p.o. daily and metolazone 2.5 mg prn for AKI.  Resume diuretics following discharge.  Normocytic anemia Hemoglobin range 11-13.  Stable.  Weakness/debility, unsteady gait PT recommended home health with rolling walker.  Case management for coordination of equipment/services.  Discharge Diagnoses:  Principal Problem:   Epigastric abdominal pain Active Problems:   Hypokalemia   Transaminitis   PAF (paroxysmal atrial fibrillation) (HCC)   ICD (implantable cardioverter-defibrillator), dual, [place]   Chronic anticoagulation   Prolonged QT interval   Acute kidney injury superimposed on chronic kidney disease (HCC)   Nausea and vomiting   Right adrenal mass (HCC) possible adenoma   Nephrolithiasis    Discharge Instructions  Discharge Instructions    (HEART FAILURE PATIENTS) Call MD:  Anytime you have any of the following symptoms: 1) 3 pound weight gain in 24 hours or 5 pounds in 1 week 2) shortness of breath, with or without a dry hacking cough 3) swelling in the hands, feet or stomach 4) if you have to sleep on extra pillows at night in order to breathe.   Complete by: As directed    Call MD for:  difficulty breathing, headache or visual disturbances   Complete by: As  directed    Call MD for:  extreme fatigue   Complete by: As directed    Call MD for:  persistant dizziness or light-headedness   Complete by: As directed    Call MD for:  persistant nausea and vomiting   Complete by: As directed    Call MD for:  severe uncontrolled pain   Complete by: As directed    Call MD for:  temperature >100.4   Complete by: As directed    Diet - low sodium heart healthy   Complete by: As directed    Avoid fried foods caffeine alcohol and cigarettes   Diet - low sodium heart healthy   Complete by: As directed    Increase activity slowly   Complete by: As directed    Increase activity slowly   Complete by: As directed      Allergies as of 03/21/2019      Reactions   Strawberry Extract Hives, Itching, Swelling         Medication List    TAKE these medications   amiodarone 200 MG tablet Commonly known as: Pacerone Take 1 tablet (200 mg total) by mouth daily.   apixaban 2.5 MG Tabs tablet Commonly known as: ELIQUIS Take 1 tablet (2.5 mg total) by mouth 2 (two) times daily. What changed:   medication strength  how much to take   atorvastatin 40 MG tablet Commonly known as: LIPITOR Take 1 tablet (40 mg total) by mouth daily at 6 PM.   carvedilol 3.125 MG tablet Commonly known as: COREG Take 1 tablet (3.125 mg total) by mouth 2 (two) times daily with a meal.   Ensure Take 237 mLs by mouth daily.   ferrous gluconate 324  MG tablet Commonly known as: FERGON Take 324 mg by mouth 2 (two) times daily.   furosemide 80 MG tablet Commonly known as: LASIX Take 1 tablet (80 mg total) by mouth daily.   hydrALAZINE 25 MG tablet Commonly known as: APRESOLINE Take 0.5 tablets (12.5 mg total) by mouth 3 (three) times daily.   isosorbide mononitrate 30 MG 24 hr tablet Commonly known as: IMDUR Take 0.5 tablets (15 mg total) by mouth daily.   levalbuterol 45 MCG/ACT inhaler Commonly known as: XOPENEX HFA Inhale 1 puff into the lungs every 4 (four)  hours as needed for wheezing.   MAGnesium-Oxide 400 (241.3 Mg) MG tablet Generic drug: magnesium oxide TAKE 1 TABLET BY MOUTH ONCE DAILY What changed:   how much to take  when to take this  reasons to take this   metolazone 2.5 MG tablet Commonly known as: ZAROXOLYN Take 1 tablet (2.5 mg total) by mouth daily as needed (leg swellings or shortness of breath or weight gain>5 pounds in 48 hours).   mexiletine 200 MG capsule Commonly known as: MEXITIL Take 1 capsule (200 mg total) by mouth 2 (two) times daily.   nitroGLYCERIN 0.4 MG SL tablet Commonly known as: NITROSTAT Place 1 tablet (0.4 mg total) under the tongue every 5 (five) minutes as needed for chest pain.   ondansetron 4 MG disintegrating tablet Commonly known as: Zofran ODT Take 1 tablet (4 mg total) by mouth every 8 (eight) hours as needed for nausea or vomiting.   pantoprazole 40 MG tablet Commonly known as: PROTONIX Take 1 tablet (40 mg total) by mouth 2 (two) times daily before a meal for 14 days. What changed: Another medication with the same name was added. Make sure you understand how and when to take each.   pantoprazole 40 MG tablet Commonly known as: Protonix Take 1 tablet (40 mg total) by mouth daily. Start taking on: April 05, 2019 What changed: You were already taking a medication with the same name, and this prescription was added. Make sure you understand how and when to take each.   polyethylene glycol 17 g packet Commonly known as: MIRALAX / GLYCOLAX Take 17 g by mouth daily. Can take up to twice a day for constipation.   sucralfate 1 GM/10ML suspension Commonly known as: CARAFATE Take 10 mLs (1 g total) by mouth 4 (four) times daily -  with meals and at bedtime.   Vitamin D-3 25 MCG (1000 UT) Caps Take 1,000 Units by mouth at bedtime.            Durable Medical Equipment  (From admission, onward)         Start     Ordered   03/21/19 1216  For home use only DME Walker rolling   Once    Question:  Patient needs a walker to treat with the following condition  Answer:  Gait disturbance   03/21/19 1215         Follow-up Information    Ronnette Juniper, MD. Schedule an appointment as soon as possible for a visit in 6 week(s).   Specialty: Gastroenterology Why: Recurrent abdominal pain, gallbladder lesion Contact information: [place] [address] [phone]        Josetta Huddle, MD. Go on 03/27/2019.   Specialty: Internal Medicine Why: Please perform a blood work in 1 to 2 weeks to check her creatinine, potassium and magnesium- thank you Contact information: [address] [place] [address]  [phone]        Josetta Huddle, MD. Go on 03/27/2019.   Specialty: Internal Medicine Why: @1 :30pm Contact information: [address] [place] [address] [phone]          Allergies  Allergen Reactions  . Strawberry Extract Hives, Itching and Swelling         Consultations:  [facility]  General surgery   Procedures/Studies: Nm Hepatobiliary Liver Func  Result Date: 03/17/2019 CLINICAL DATA:  Epigastric abdominal pain over the last 2 weeks with intermittent nausea and vomiting with each meal. EXAM: NUCLEAR MEDICINE HEPATOBILIARY IMAGING TECHNIQUE: Sequential images of the abdomen were obtained out to 60 minutes following intravenous administration of radiopharmaceutical. RADIOPHARMACEUTICALS:  5.3 mCi Tc-75m  Choletec IV COMPARISON:  None. FINDINGS: Prompt uptake and biliary excretion of activity by the liver is seen. Gallbladder activity is visualized, consistent with patency of cystic duct. Biliary activity passes into small bowel, consistent with patent common bile duct. IMPRESSION: Normal exam.  No evidence of cholecystitis. Electronically Signed   By: Franki Cabot M.D.   On: 03/17/2019 16:26   Dg Abd 2 Views  Result Date: 02/28/2019 CLINICAL DATA:  Constipation EXAM:  ABDOMEN - 2 VIEW COMPARISON:  CT abdomen pelvis January 28, 2019 FINDINGS: Nonobstructive bowel gas pattern. Moderate volume of stool throughout the colon. Air and stool overlies the rectal vault. No suspicious calcifications over the gallbladder fossa or renal shadows though limited by overlying ingested material. The heart is enlarged with a 2 lead pacer/defibrillator device, battery pack within the soft tissues of the left chest wall. Lung bases are clear. IMPRESSION: Moderate volume of stool throughout the colon. No high-grade obstructive pattern. Electronically Signed   By: Lovena Le M.D.   On: 02/28/2019 06:21   Ct Renal Stone Study  Result Date: 03/17/2019 CLINICAL DATA:  Hematuria EXAM: CT ABDOMEN AND PELVIS WITHOUT CONTRAST TECHNIQUE: Multidetector CT imaging of the abdomen and pelvis was performed following the standard protocol without IV contrast. COMPARISON:  01/28/2019 FINDINGS: LOWER CHEST: Moderate cardiomegaly HEPATOBILIARY: Mildly nodular hepatic contours with relative hypertrophy of the caudate and left hepatic lobe, consistent with hepatic cirrhosis. No focal liver lesion. No biliary dilatation. Hyperdensity in the gallbladder may be vicarious excretion of contrast related to a prior study. PANCREAS: The pancreatic parenchymal contours are normal and there is no ductal dilatation. There is no peripancreatic fluid collection. SPLEEN: Normal. ADRENALS/URINARY TRACT: --Adrenal glands: Unchanged right adrenal myelolipoma --Right kidney/ureter: Single nonobstructing renal calculus measuring 6 mm. No hydronephrosis, perinephric stranding or solid renal mass. --Left kidney/ureter: Single nonobstructing renal calculus measuring 3 mm. No hydronephrosis, perinephric stranding or solid renal mass. --Urinary bladder: Normal for degree of distention STOMACH/BOWEL: --Stomach/Duodenum: Mild wall thickening at the gastric fundus. Normal duodenal course. --Small bowel: No dilatation or inflammation. --Colon:  Extensive diverticulosis without acute inflammation. --Appendix: Not visualized. No right lower quadrant inflammation or free fluid. VASCULAR/LYMPHATIC: There is aortic atherosclerosis without hemodynamically significant stenosis. No abdominal or pelvic lymphadenopathy. REPRODUCTIVE: Small calcified uterine fibroid. No adnexal mass. MUSCULOSKELETAL. Grade 1 anterolisthesis at L4-5. OTHER: None. IMPRESSION: 1. No obstructive uropathy or other acute abdominal or pelvic abnormality. 2. Nonobstructive bilateral nephrolithiasis. 3. Hepatic cirrhosis. 4. Aortic Atherosclerosis (ICD10-I70.0). Electronically Signed   By: Ulyses Jarred M.D.   On: 03/17/2019 06:02   US Abdomen Limited Ruq  Result Date: 03/17/2019 CLINICAL DATA:  Epigastric pain for 2 weeks, nausea and vomiting. EXAM: ULTRASOUND ABDOMEN LIMITED RIGHT UPPER QUADRANT COMPARISON:  None. FINDINGS: Gallbladder: Gallbladder walls appear  thickened, with a demonstrated measurement of 7 mm. Heterogeneous masslike structure at the gallbladder fundus measures 2.6 cm greatest dimension. No gallstones seen. No sonographic Murphy's sign elicited during the exam. Common bile duct: Diameter: 5 mm Liver: Liver echotexture appears coarsened and the peripheral liver contours appear slightly nodular suggesting underlying cirrhosis. No focal liver abnormality identified. Portal vein is patent on color Doppler imaging with normal direction of blood flow towards the liver. Other: None. IMPRESSION: 1. Gallbladder walls are thickened. Gallbladder wall thickening is nonspecific with differential considerations of cholecystitis, CHF, hypoproteinemia and reactive thickening secondary to adjacent liver disease. Given the cirrhotic appearing liver, I suspect this gallbladder wall thickening is reactive to adjacent liver disease. Given the epigastric pain, would consider nuclear medicine HIDA scan to exclude the less likely possibility of cholecystitis. 2. Heterogeneous masslike  structure demonstrated at the gallbladder fundus, measuring 2.6 cm greatest dimension. Based on today's earlier CT, I suspect that this is abutting colon with heterogeneous appearance secondary to underlying diverticulosis. At minimum, recommend follow-up gallbladder ultrasound in 3 months to ensure stability or resolution and thereby ensure benignity. If persistent or increased on subsequent ultrasound, would then recommend MRI for definitive characterization. 3. Cirrhotic appearing liver. Electronically Signed   By: Franki Cabot M.D.   On: 03/17/2019 08:30   US Abdomen Limited Ruq  Result Date: 02/23/2019 CLINICAL DATA:  73 year old female with acute abdominal pain. Elevated lipase. EXAM: ULTRASOUND ABDOMEN LIMITED RIGHT UPPER QUADRANT COMPARISON:  01/28/2019 prior CTs. FINDINGS: Gallbladder: Gallbladder wall thickening is identified and appears mildly edematous. More focal thickening of the gallbladder wall towards the fundus is noted. There is no evidence of cholelithiasis. Common bile duct: Diameter: 6 mm.  No intrahepatic or extrahepatic biliary dilatation. Liver: No focal lesion identified. Within normal limits in parenchymal echogenicity. Portal vein is patent on color Doppler imaging with normal direction of blood flow towards the liver. Other: None. IMPRESSION: 1. Nonspecific gallbladder wall thickening. No evidence of cholelithiasis. If there is strong clinical suspicion for acute cholecystitis, consider nuclear medicine study. 2. No other significant abnormalities. Electronically Signed   By: Margarette Canada M.D.   On: 02/23/2019 19:44     EGD - LA Grade C esophagitis. - Three gastric polyps. - Congested, inflamed and tight circumferentially folded mucosa in the prepyloric region of the stomach. - Normal duodenal bulb, first portion of the duodenum and second portion of the duodenum. - No specimens collected.  colonoscopy 03/20/2019 - One 8 mm polyp in the ascending colon, removed with a  cold snare. Resected and retrieved. - Normal mucosa at the hepatic flexure. - Four 4 to 5 mm polyps in the transverse colon, removed with a cold snare. Resected and retrieved. - One 5 mm polyp in the descending colon, removed with a cold snare. Resected and retrieved. - Two small polyps in the rectum, removed with a cold snare. Complete resection. Polyp tissue not retrieved. - Diverticulosis in the entire examined colon. - Await pathology results.   Subjective: Patient seen and examined at bedside, sitting at side of bed.  Tolerating diet.  Ready for discharge home.  Seen by PT this morning with recommendations of home health.  No other complaints or concerns at this time.  Denies headache, no fever/chills/night sweats, no chest pain, no palpitations, no abdominal pain, no cough/congestion, no paresthesias.  No acute events overnight per nursing staff.   Discharge Exam: Vitals:   03/21/19 0500 03/21/19 1138  BP: 117/69 (!) 117/49  Pulse: 60 (!) 59  Resp: 18 16  Temp: 98.4 F (36.9 C) 97.9 F (36.6 C)  SpO2: 93% 94%   Vitals:   03/20/19 1940 03/21/19 0500 03/21/19 0524 03/21/19 1138  BP: 134/64 117/69  (!) 117/49  Pulse: 60 60  (!) 59  Resp: 20 18  16   Temp: 98.1 F (36.7 C) 98.4 F (36.9 C)  97.9 F (36.6 C)  TempSrc: Oral Oral  Oral  SpO2: 97% 93%  94%  Weight:   59.8 kg   Height:        General: Pt is alert, awake, not in acute distress Cardiovascular: RRR, S1/S2 +, no rubs, no gallops Respiratory: CTA bilaterally, no wheezing, no rhonchi Abdominal: Soft, NT, ND, bowel sounds + Extremities: no edema, no cyanosis    The results of significant diagnostics from this hospitalization (including imaging, microbiology, ancillary and laboratory) are listed below for reference.     Microbiology: Recent Results (from the past 240 hour(s))  SARS CORONAVIRUS 2 (TAT 6-24 HRS) Nasopharyngeal Nasopharyngeal Swab     Status: None   Collection Time: 03/17/19 10:29 AM    Specimen: Nasopharyngeal Swab  Result Value Ref Range Status   SARS Coronavirus 2 NEGATIVE NEGATIVE Final    Comment: (NOTE) SARS-CoV-2 target nucleic acids are NOT DETECTED. The SARS-CoV-2 RNA is generally detectable in upper and lower respiratory specimens during the acute phase of infection. Negative results do not preclude SARS-CoV-2 infection, do not rule out co-infections with other pathogens, and should not be used as the sole basis for treatment or other patient management decisions. Negative results must be combined with clinical observations, patient history, and epidemiological information. The expected result is Negative. Fact Sheet for Patients: SugarRoll.be Fact Sheet for Healthcare Providers: https://www.woods-mathews.com/ This test is not yet approved or cleared by the [place] FDA and  has been authorized for detection and/or diagnosis of SARS-CoV-2 by FDA under an Emergency Use Authorization (EUA). This EUA will remain  in effect (meaning this test can be used) for the duration of the COVID-19 declaration under Section 56 4(b)(1) of the Act, 21 U.S.C. section 360bbb-3(b)(1), unless the authorization is terminated or revoked sooner. Performed at [facility], [address], [place], [place] [address]      Labs: BNP (last 3 results) Recent Labs    09/02/18 1541 01/05/19 2213 01/10/19 0422  BNP 819.2* 1,129.5* 9,390.3*   Basic Metabolic Panel: Recent Labs  Lab 03/16/19 2049 03/18/19 0601 03/19/19 0425 03/20/19 0453  NA 136 137 138 137  K 2.9* 3.5 4.6 5.0  CL 97* 102 104 106  CO2 28 26 26 23   GLUCOSE 115* 96 86 81  BUN 60* 48* 37* 28*  CREATININE 2.94* 2.19* 1.81* 1.65*  CALCIUM 9.2 8.6* 8.7* 8.4*  MG 2.9* 2.7* 2.3  --    Liver Function Tests: Recent Labs  Lab 03/16/19 2049 03/18/19 0601  AST 147* 132*  ALT 75* 72*  ALKPHOS 81 70  BILITOT 1.3* 0.9  PROT 8.5* 6.9  ALBUMIN 3.5 2.9*    Recent Labs  Lab 03/16/19 2049  LIPASE 38   No results for input(s): AMMONIA in the last 168 hours. CBC: Recent Labs  Lab 03/16/19 2049 03/18/19 0601  WBC 6.2 4.8  HGB 12.8 11.5*  HCT 39.9 36.1  MCV 83.3 83.8  PLT 276 211   Cardiac Enzymes: No results for input(s): CKTOTAL, CKMB, CKMBINDEX, TROPONINI in the last 168 hours. BNP: Invalid input(s): POCBNP CBG: No results for input(s): GLUCAP in the last 168  hours. D-Dimer No results for input(s): DDIMER in the last 72 hours. Hgb A1c No results for input(s): HGBA1C in the last 72 hours. Lipid Profile No results for input(s): CHOL, HDL, LDLCALC, TRIG, CHOLHDL, LDLDIRECT in the last 72 hours. Thyroid function studies No results for input(s): TSH, T4TOTAL, T3FREE, THYROIDAB in the last 72 hours.  Invalid input(s): FREET3 Anemia work up Recent Labs    03/19/19 0425  VITAMINB12 469   Urinalysis    Component Value Date/Time   COLORURINE AMBER (A) 03/16/2019 2046   APPEARANCEUR HAZY (A) 03/16/2019 2046   LABSPEC 1.009 03/16/2019 2046   PHURINE 7.0 03/16/2019 2046   GLUCOSEU NEGATIVE 03/16/2019 2046   HGBUR LARGE (A) 03/16/2019 2046   [place] NEGATIVE 03/16/2019 2046   KETONESUR NEGATIVE 03/16/2019 2046   PROTEINUR 30 (A) 03/16/2019 2046   UROBILINOGEN 1.0 08/29/2014 2035   NITRITE NEGATIVE 03/16/2019 2046   LEUKOCYTESUR NEGATIVE 03/16/2019 2046   Sepsis Labs Invalid input(s): PROCALCITONIN,  WBC,  LACTICIDVEN Microbiology Recent Results (from the past 240 hour(s))  SARS CORONAVIRUS 2 (TAT 6-24 HRS) Nasopharyngeal Nasopharyngeal Swab     Status: None   Collection Time: 03/17/19 10:29 AM   Specimen: Nasopharyngeal Swab  Result Value Ref Range Status   SARS Coronavirus 2 NEGATIVE NEGATIVE Final    Comment: (NOTE) SARS-CoV-2 target nucleic acids are NOT DETECTED. The SARS-CoV-2 RNA is generally detectable in upper and lower respiratory specimens during the acute phase of infection. Negative results do not  preclude SARS-CoV-2 infection, do not rule out co-infections with other pathogens, and should not be used as the sole basis for treatment or other patient management decisions. Negative results must be combined with clinical observations, patient history, and epidemiological information. The expected result is Negative. Fact Sheet for Patients: SugarRoll.be Fact Sheet for Healthcare Providers: https://www.woods-mathews.com/ This test is not yet approved or cleared by the [place] FDA and  has been authorized for detection and/or diagnosis of SARS-CoV-2 by FDA under an Emergency Use Authorization (EUA). This EUA will remain  in effect (meaning this test can be used) for the duration of the COVID-19 declaration under Section 56 4(b)(1) of the Act, 21 U.S.C. section 360bbb-3(b)(1), unless the authorization is terminated or revoked sooner. Performed at [facility], [place] [address] [place], [place] [address]      Time coordinating discharge: Over 30 minutes  SIGNED:   Donnamarie Poag [place] DO  [facility] 03/21/2019, 12:21 PM

## 2019-03-21 NOTE — TOC Initial Note (Addendum)
Transition of Care (TOC) - Initial/Assessment Note    Patient Details  Name: Stephanie Yates MRN: [id number] Date of Birth: 06/23/1946  Transition of Care (TOC) CM/SW Contact:    Ashley M Hill, LCSW Phone Number: [phone] 03/21/2019, 1:10 PM  Clinical Narrative:                  Update: patient is set up with Home Health PT and RN through [facility].   CSW met with patient to review discharge planning, patient reports she is accepting of home health PT and RN services. CSW informed of walker recommendation that will be delivered to room via [employer], patient resistant to this however CSW and RN at bedside encouraged patient. Patient agreeable to take walker home however resistant to use.   CSW sending referrals for PT and RN Home health agencies, pending acceptance at this time.   Expected Discharge Plan: Home w Home Health Services Barriers to Discharge: No Barriers Identified   Patient Goals and CMS Choice Patient states their goals for this hospitalization and ongoing recovery are:: to go home CMS Medicare.gov Compare Post Acute Care list provided to:: Patient Choice offered to / list presented to : Patient  Expected Discharge Plan and Services Expected Discharge Plan: Home w Home Health Services     Post Acute Care Choice: Home Health Living arrangements for the past 2 months: Single Family Home Expected Discharge Date: 03/21/19                                    Prior Living Arrangements/Services Living arrangements for the past 2 months: Single Family Home Lives with:: Self Patient language and need for interpreter reviewed:: Yes Do you feel safe going back to the place where you live?: Yes      Need for Family Participation in Patient Care: Yes (Comment) Care giver support system in place?: Yes (comment)   Criminal Activity/Legal Involvement Pertinent to Current Situation/Hospitalization: No - Comment as needed  Activities of Daily Living Home  Assistive Devices/Equipment: None ADL Screening (condition at time of admission) Patient's cognitive ability adequate to safely complete daily activities?: Yes Is the patient deaf or have difficulty hearing?: No Does the patient have difficulty seeing, even when wearing glasses/contacts?: No Does the patient have difficulty concentrating, remembering, or making decisions?: No Patient able to express need for assistance with ADLs?: Yes Does the patient have difficulty dressing or bathing?: No Independently performs ADLs?: Yes (appropriate for developmental age) Does the patient have difficulty walking or climbing stairs?: No Weakness of Legs: Both Weakness of Arms/Hands: None  Permission Sought/Granted Permission sought to share information with : Case Manager, Facility Contact Representative, Family Supports Permission granted to share information with : Yes, Verbal Permission Granted     Permission granted to share info w AGENCY: Home Health        Emotional Assessment Appearance:: Appears stated age Attitude/Demeanor/Rapport: Gracious Affect (typically observed): Accepting Orientation: : Oriented to Self, Oriented to Place, Oriented to  Time, Oriented to Situation, Fluctuating Orientation (Suspected and/or reported Sundowners) Alcohol / Substance Use: Not Applicable Psych Involvement: No (comment)  Admission diagnosis:  Dehydration [E86.0] Hypokalemia [E87.6] Epigastric pain [R10.13] Intractable abdominal pain [R10.9] Intractable nausea and vomiting [R11.2] Patient Active Problem List   Diagnosis Date Noted  . Right adrenal mass (HCC) possible adenoma 03/18/2019  . Nephrolithiasis 03/18/2019  . Prolonged QT interval 03/17/2019  . Acute kidney   injury superimposed on chronic kidney disease ([place]) 03/17/2019  . Nausea and vomiting 03/17/2019  . Epigastric abdominal pain 01/29/2019  . Gastritis 01/28/2019  . CKD (chronic kidney disease) stage 4, GFR 15-29 ml/min (HCC)  01/28/2019  . VT (ventricular tachycardia) ([place]) 01/17/2019  . AKI (acute kidney injury) ([place]) 01/12/2019  . Palliative care encounter   . CHF exacerbation ([place]) 01/06/2019  . Influenza A 09/02/2018  . Lactic acidosis 09/02/2018  . Chronic combined systolic and diastolic congestive heart failure ([place]) 09/02/2018  . Rapid atrial fibrillation ([place]) 06/08/2018  . Acute on chronic combined systolic and diastolic heart failure ([place]) 06/07/2018  . Anemia 06/07/2018  . Acute GI bleeding 06/15/2017  . Chronic anticoagulation   . Acute on chronic congestive heart failure ([place])   . HLD (hyperlipidemia) 03/05/2017  . CKD (chronic kidney disease), stage III ([place]) 03/05/2017  . Chest pain 03/05/2017  . Symptomatic anemia 03/05/2017  . PAF (paroxysmal atrial fibrillation) ([place]) 04/01/2013  . ICD (implantable cardioverter-defibrillator), dual, [place] 04/01/2013  . PVC's (premature ventricular contractions) 04/01/2013  . Hypokalemia 12/21/2012  . History of tobacco abuse 12/21/2012  . Transaminitis 12/21/2012  . Sinus bradycardia 12/21/2012  . Hyperkalemia 12/20/2012  . Nonischemic cardiomyopathy ([place]) 12/20/2012  . UTI (urinary tract infection) 12/20/2012  . Acute on chronic combined systolic and diastolic CHF (congestive heart failure) ([place]) 12/20/2012  . Ventricular tachycardia ([place]) 12/14/2012  . Hypertension 12/14/2012   PCP:  Josetta Huddle, MD Pharmacy:   [facility] [place], [place] - [place] [facility] [address] [place] [address] Phone: [phone] Fax: [phone]     Social Determinants of Health (SDOH) Interventions    Readmission Risk Interventions Readmission Risk Prevention Plan 01/24/2019 01/08/2019 09/05/2018  Transportation Screening Complete - Complete  PCP or Specialist Appt within 3-5 Days Complete - Complete  HRI or Home Care Consult Complete Patient refused Complete  Social Work Consult for  [place] Planning/Counseling Complete - Complete  Palliative Care Screening Not Applicable Not Applicable Not Applicable  Medication Review Press photographer) Complete - Not Complete  Med Review Comments - - pt not ready for dc/ rn to complete when pt is ready for dc  Some recent data might be hidden

## 2019-03-21 NOTE — Progress Notes (Signed)
CSW informed from [place] with [employer] that upon arrival with rolling walker patient refused to accept it.   [place], [place]

## 2019-03-22 LAB — CUP PACEART REMOTE DEVICE CHECK
Battery Remaining Longevity: 28 mo
Battery Remaining Percentage: 36 %
Battery Voltage: 2.89 V
Brady Statistic AP VP Percent: 7.5 %
Brady Statistic AP VS Percent: 85 %
Brady Statistic AS VP Percent: 1 %
Brady Statistic AS VS Percent: 6.9 %
Brady Statistic RA Percent Paced: 92 %
Brady Statistic RV Percent Paced: 7.6 %
Date Time Interrogation Session: 20200925060014
HighPow Impedance: 54 Ohm
HighPow Impedance: 54 Ohm
Implantable Lead Implant Date: 20140625
Implantable Lead Implant Date: 20140625
Implantable Lead Location: 753859
Implantable Lead Location: 753860
Implantable Pulse Generator Implant Date: 20140625
Lead Channel Impedance Value: 380 Ohm
Lead Channel Impedance Value: 560 Ohm
Lead Channel Pacing Threshold Amplitude: 1 V
Lead Channel Pacing Threshold Amplitude: 3.25 V
Lead Channel Pacing Threshold Pulse Width: 0.5 ms
Lead Channel Pacing Threshold Pulse Width: 0.6 ms
Lead Channel Sensing Intrinsic Amplitude: 1.7 mV
Lead Channel Sensing Intrinsic Amplitude: 9.2 mV
Lead Channel Setting Pacing Amplitude: 2 V
Lead Channel Setting Pacing Amplitude: 3.5 V
Lead Channel Setting Pacing Pulse Width: 0.6 ms
Lead Channel Setting Sensing Sensitivity: 0.5 mV
Pulse Gen Serial Number: 7093938

## 2019-03-29 ENCOUNTER — Encounter: Payer: Self-pay | Admitting: Cardiology

## 2019-03-29 NOTE — Progress Notes (Signed)
Remote ICD transmission.   

## 2019-04-15 ENCOUNTER — Emergency Department (HOSPITAL_COMMUNITY): Payer: Medicare Other

## 2019-04-15 ENCOUNTER — Inpatient Hospital Stay (HOSPITAL_COMMUNITY)
Admission: EM | Admit: 2019-04-15 | Discharge: 2019-04-19 | DRG: 682 | Disposition: A | Discharge status: Home Health | Payer: Medicare Other | Attending: Family Medicine | Admitting: Family Medicine

## 2019-04-15 ENCOUNTER — Encounter (HOSPITAL_COMMUNITY): Payer: Self-pay | Admitting: Emergency Medicine

## 2019-04-15 ENCOUNTER — Other Ambulatory Visit: Payer: Self-pay

## 2019-04-15 DIAGNOSIS — Z20828 Contact with and (suspected) exposure to other viral communicable diseases: Secondary | ICD-10-CM | POA: Diagnosis present

## 2019-04-15 DIAGNOSIS — I130 Hypertensive heart and chronic kidney disease with heart failure and stage 1 through stage 4 chronic kidney disease, or unspecified chronic kidney disease: Secondary | ICD-10-CM | POA: Diagnosis present

## 2019-04-15 DIAGNOSIS — I480 Paroxysmal atrial fibrillation: Secondary | ICD-10-CM | POA: Diagnosis present

## 2019-04-15 DIAGNOSIS — E876 Hypokalemia: Secondary | ICD-10-CM | POA: Diagnosis not present

## 2019-04-15 DIAGNOSIS — E43 Unspecified severe protein-calorie malnutrition: Secondary | ICD-10-CM | POA: Diagnosis present

## 2019-04-15 DIAGNOSIS — E785 Hyperlipidemia, unspecified: Secondary | ICD-10-CM | POA: Diagnosis present

## 2019-04-15 DIAGNOSIS — I5022 Chronic systolic (congestive) heart failure: Secondary | ICD-10-CM | POA: Diagnosis present

## 2019-04-15 DIAGNOSIS — R945 Abnormal results of liver function studies: Secondary | ICD-10-CM | POA: Diagnosis not present

## 2019-04-15 DIAGNOSIS — N179 Acute kidney failure, unspecified: Secondary | ICD-10-CM | POA: Diagnosis not present

## 2019-04-15 DIAGNOSIS — N184 Chronic kidney disease, stage 4 (severe): Secondary | ICD-10-CM | POA: Diagnosis present

## 2019-04-15 DIAGNOSIS — E860 Dehydration: Secondary | ICD-10-CM | POA: Diagnosis present

## 2019-04-15 DIAGNOSIS — R42 Dizziness and giddiness: Secondary | ICD-10-CM | POA: Diagnosis not present

## 2019-04-15 DIAGNOSIS — K829 Disease of gallbladder, unspecified: Secondary | ICD-10-CM | POA: Diagnosis present

## 2019-04-15 DIAGNOSIS — I447 Left bundle-branch block, unspecified: Secondary | ICD-10-CM | POA: Diagnosis present

## 2019-04-15 DIAGNOSIS — D509 Iron deficiency anemia, unspecified: Secondary | ICD-10-CM | POA: Diagnosis present

## 2019-04-15 DIAGNOSIS — I428 Other cardiomyopathies: Secondary | ICD-10-CM | POA: Diagnosis present

## 2019-04-15 DIAGNOSIS — K2100 Gastro-esophageal reflux disease with esophagitis, without bleeding: Secondary | ICD-10-CM | POA: Diagnosis not present

## 2019-04-15 DIAGNOSIS — Z91018 Allergy to other foods: Secondary | ICD-10-CM

## 2019-04-15 DIAGNOSIS — I10 Essential (primary) hypertension: Secondary | ICD-10-CM | POA: Diagnosis present

## 2019-04-15 DIAGNOSIS — Z8719 Personal history of other diseases of the digestive system: Secondary | ICD-10-CM

## 2019-04-15 DIAGNOSIS — D649 Anemia, unspecified: Secondary | ICD-10-CM | POA: Diagnosis present

## 2019-04-15 DIAGNOSIS — N183 Chronic kidney disease, stage 3 unspecified: Secondary | ICD-10-CM

## 2019-04-15 DIAGNOSIS — Z7901 Long term (current) use of anticoagulants: Secondary | ICD-10-CM

## 2019-04-15 DIAGNOSIS — Z79899 Other long term (current) drug therapy: Secondary | ICD-10-CM

## 2019-04-15 DIAGNOSIS — Z95810 Presence of automatic (implantable) cardiac defibrillator: Secondary | ICD-10-CM

## 2019-04-15 DIAGNOSIS — Z87891 Personal history of nicotine dependence: Secondary | ICD-10-CM

## 2019-04-15 DIAGNOSIS — N189 Chronic kidney disease, unspecified: Secondary | ICD-10-CM | POA: Diagnosis present

## 2019-04-15 LAB — COMPREHENSIVE METABOLIC PANEL
ALT: 60 U/L — ABNORMAL HIGH (ref 0–44)
AST: 112 U/L — ABNORMAL HIGH (ref 15–41)
Albumin: 3.2 g/dL — ABNORMAL LOW (ref 3.5–5.0)
Alkaline Phosphatase: 87 U/L (ref 38–126)
Anion gap: 15 (ref 5–15)
BUN: 56 mg/dL — ABNORMAL HIGH (ref 8–23)
CO2: 21 mmol/L — ABNORMAL LOW (ref 22–32)
Calcium: 8.7 mg/dL — ABNORMAL LOW (ref 8.9–10.3)
Chloride: 93 mmol/L — ABNORMAL LOW (ref 98–111)
Creatinine, Ser: 3.23 mg/dL — ABNORMAL HIGH (ref 0.44–1.00)
GFR calc Af Amer: 15 mL/min — ABNORMAL LOW (ref >60)
GFR calc non Af Amer: 13 mL/min — ABNORMAL LOW (ref >60)
Glucose, Bld: 112 mg/dL — ABNORMAL HIGH (ref 70–99)
Potassium: 2.6 mmol/L — CL (ref 3.5–5.1)
Sodium: 129 mmol/L — ABNORMAL LOW (ref 135–145)
Total Bilirubin: 1.1 mg/dL (ref 0.3–1.2)
Total Protein: 7.7 g/dL (ref 6.5–8.1)

## 2019-04-15 LAB — CBC WITH DIFFERENTIAL/PLATELET
Abs Immature Granulocytes: 0.05 10*3/uL (ref 0.00–0.07)
Basophils Absolute: 0.1 10*3/uL (ref 0.0–0.1)
Basophils Relative: 1 %
Eosinophils Absolute: 0.1 10*3/uL (ref 0.0–0.5)
Eosinophils Relative: 1 %
HCT: 38.2 % (ref 36.0–46.0)
Hemoglobin: 12.5 g/dL (ref 12.0–15.0)
Immature Granulocytes: 1 %
Lymphocytes Relative: 19 %
Lymphs Abs: 1.9 10*3/uL (ref 0.7–4.0)
MCH: 26.9 pg (ref 26.0–34.0)
MCHC: 32.7 g/dL (ref 30.0–36.0)
MCV: 82.3 fL (ref 80.0–100.0)
Monocytes Absolute: 1 10*3/uL (ref 0.1–1.0)
Monocytes Relative: 10 %
Neutro Abs: 6.8 10*3/uL (ref 1.7–7.7)
Neutrophils Relative %: 68 %
Platelets: 336 10*3/uL (ref 150–400)
RBC: 4.64 MIL/uL (ref 3.87–5.11)
RDW: 17.2 % — ABNORMAL HIGH (ref 11.5–15.5)
WBC: 10 10*3/uL (ref 4.0–10.5)
nRBC: 0 % (ref 0.0–0.2)

## 2019-04-15 LAB — BRAIN NATRIURETIC PEPTIDE: B Natriuretic Peptide: 566.4 pg/mL — ABNORMAL HIGH (ref 0.0–100.0)

## 2019-04-15 LAB — PROTIME-INR
INR: 2.4 — ABNORMAL HIGH (ref 0.8–1.2)
Prothrombin Time: 25.5 seconds — ABNORMAL HIGH (ref 11.4–15.2)

## 2019-04-15 LAB — CK: Total CK: 42 U/L (ref 38–234)

## 2019-04-15 LAB — LACTIC ACID, PLASMA: Lactic Acid, Venous: 1.3 mmol/L (ref 0.5–1.9)

## 2019-04-15 LAB — TROPONIN I (HIGH SENSITIVITY)
Troponin I (High Sensitivity): 22 ng/L — ABNORMAL HIGH (ref <18)
Troponin I (High Sensitivity): 29 ng/L — ABNORMAL HIGH (ref <18)

## 2019-04-15 LAB — APTT: aPTT: 57 seconds — ABNORMAL HIGH (ref 24–36)

## 2019-04-15 MED ORDER — SUCRALFATE 1 GM/10ML PO SUSP
1.0000 g | Freq: Three times a day (TID) | ORAL | Status: DC
  Administered 2019-04-16 (×2): 1 g via ORAL
  Filled 2019-04-15 (×2): qty 10

## 2019-04-15 MED ORDER — PANTOPRAZOLE SODIUM 40 MG PO TBEC
40.0000 mg | DELAYED_RELEASE_TABLET | Freq: Two times a day (BID) | ORAL | Status: DC
  Administered 2019-04-16 – 2019-04-19 (×7): 40 mg via ORAL
  Filled 2019-04-15 (×7): qty 1

## 2019-04-15 MED ORDER — LACTATED RINGERS IV SOLN
INTRAVENOUS | Status: DC
  Administered 2019-04-15: 22:00:00 via INTRAVENOUS

## 2019-04-15 MED ORDER — ENSURE ENLIVE PO LIQD
237.0000 mL | Freq: Every day | ORAL | Status: DC
  Administered 2019-04-17 – 2019-04-19 (×3): 237 mL via ORAL

## 2019-04-15 MED ORDER — SODIUM CHLORIDE 0.9% FLUSH
3.0000 mL | Freq: Two times a day (BID) | INTRAVENOUS | Status: DC
  Administered 2019-04-16 – 2019-04-19 (×6): 3 mL via INTRAVENOUS

## 2019-04-15 MED ORDER — ACETAMINOPHEN 650 MG RE SUPP: 650.0000 mg | Freq: Four times a day (QID) | RECTAL | Status: DC | PRN

## 2019-04-15 MED ORDER — POTASSIUM CHLORIDE 10 MEQ/100ML IV SOLN
10.0000 meq | INTRAVENOUS | Status: AC
  Administered 2019-04-15 – 2019-04-16 (×3): 10 meq via INTRAVENOUS
  Filled 2019-04-15 (×3): qty 100

## 2019-04-15 MED ORDER — MECLIZINE HCL 25 MG PO TABS
12.5000 mg | ORAL_TABLET | Freq: Once | ORAL | Status: AC
  Administered 2019-04-15: 12.5 mg via ORAL
  Filled 2019-04-15: qty 1

## 2019-04-15 MED ORDER — ATORVASTATIN CALCIUM 40 MG PO TABS
40.0000 mg | ORAL_TABLET | Freq: Every day | ORAL | Status: DC
  Administered 2019-04-16 – 2019-04-18 (×3): 40 mg via ORAL
  Filled 2019-04-15 (×3): qty 1

## 2019-04-15 MED ORDER — MAGNESIUM OXIDE 400 (241.3 MG) MG PO TABS: 400.0000 mg | ORAL_TABLET | Freq: Every day | ORAL | Status: DC | PRN

## 2019-04-15 MED ORDER — SODIUM CHLORIDE 0.9% FLUSH: 3.0000 mL | INTRAVENOUS | Status: DC | PRN

## 2019-04-15 MED ORDER — FUROSEMIDE 80 MG PO TABS: 80.0000 mg | ORAL_TABLET | Freq: Every day | ORAL | Status: DC

## 2019-04-15 MED ORDER — MEXILETINE HCL 200 MG PO CAPS
200.0000 mg | ORAL_CAPSULE | Freq: Two times a day (BID) | ORAL | Status: DC
  Administered 2019-04-16 – 2019-04-19 (×7): 200 mg via ORAL
  Filled 2019-04-15 (×8): qty 1

## 2019-04-15 MED ORDER — CARVEDILOL 3.125 MG PO TABS
3.1250 mg | ORAL_TABLET | Freq: Two times a day (BID) | ORAL | Status: DC
  Administered 2019-04-16 – 2019-04-19 (×7): 3.125 mg via ORAL
  Filled 2019-04-15 (×7): qty 1

## 2019-04-15 MED ORDER — DOCUSATE SODIUM 100 MG PO CAPS
100.0000 mg | ORAL_CAPSULE | Freq: Every day | ORAL | Status: DC
  Administered 2019-04-16 – 2019-04-17 (×2): 100 mg via ORAL
  Filled 2019-04-15 (×2): qty 1

## 2019-04-15 MED ORDER — ACETAMINOPHEN 325 MG PO TABS
650.0000 mg | ORAL_TABLET | Freq: Four times a day (QID) | ORAL | Status: DC | PRN
  Administered 2019-04-16 – 2019-04-18 (×3): 650 mg via ORAL
  Filled 2019-04-15 (×5): qty 2

## 2019-04-15 MED ORDER — VITAMIN K1 10 MG/ML IJ SOLN: 10.0000 mg | Freq: Once | INTRAVENOUS | Status: DC

## 2019-04-15 MED ORDER — ISOSORBIDE MONONITRATE ER 30 MG PO TB24: 15.0000 mg | ORAL_TABLET | Freq: Every day | ORAL | Status: DC

## 2019-04-15 MED ORDER — HYDRALAZINE HCL 25 MG PO TABS
12.5000 mg | ORAL_TABLET | Freq: Three times a day (TID) | ORAL | Status: DC
  Administered 2019-04-16 – 2019-04-19 (×10): 12.5 mg via ORAL
  Filled 2019-04-15 (×11): qty 1

## 2019-04-15 MED ORDER — AMIODARONE HCL 200 MG PO TABS
200.0000 mg | ORAL_TABLET | Freq: Every day | ORAL | Status: DC
  Administered 2019-04-16 – 2019-04-19 (×4): 200 mg via ORAL
  Filled 2019-04-15 (×4): qty 1

## 2019-04-15 MED ORDER — POLYETHYLENE GLYCOL 3350 17 G PO PACK
17.0000 g | PACK | Freq: Every day | ORAL | Status: DC
  Administered 2019-04-17: 17 g via ORAL
  Filled 2019-04-15: qty 1

## 2019-04-15 MED ORDER — APIXABAN 2.5 MG PO TABS
2.5000 mg | ORAL_TABLET | Freq: Two times a day (BID) | ORAL | Status: DC
  Administered 2019-04-16 – 2019-04-19 (×7): 2.5 mg via ORAL
  Filled 2019-04-15 (×8): qty 1

## 2019-04-15 MED ORDER — VITAMIN D 25 MCG (1000 UNIT) PO TABS
1000.0000 [IU] | ORAL_TABLET | Freq: Every day | ORAL | Status: DC
  Administered 2019-04-16: 1000 [IU] via ORAL
  Filled 2019-04-15: qty 1

## 2019-04-15 MED ORDER — FERROUS GLUCONATE 324 (38 FE) MG PO TABS
324.0000 mg | ORAL_TABLET | Freq: Two times a day (BID) | ORAL | Status: DC
  Administered 2019-04-16: 02:00:00 324 mg via ORAL
  Filled 2019-04-15: qty 1

## 2019-04-15 MED ORDER — SODIUM CHLORIDE 0.9 % IV SOLN: 250.0000 mL | INTRAVENOUS | Status: DC | PRN

## 2019-04-15 MED ORDER — POTASSIUM CHLORIDE CRYS ER 20 MEQ PO TBCR
40.0000 meq | EXTENDED_RELEASE_TABLET | Freq: Once | ORAL | Status: AC
  Administered 2019-04-16: 02:00:00 40 meq via ORAL
  Filled 2019-04-15: qty 2

## 2019-04-15 NOTE — ED Provider Notes (Signed)
I have personally seen and examined the patient. I have reviewed the documentation on PMH/FH/Soc Hx. I have discussed the plan of care with the resident and patient.  I have reviewed and agree with the resident's documentation. Please see associated encounter note.  Briefly, the patient is a 73 y.o. female here with history of heart failure, nonischemic cardiomyopathy, atrial fibrillation who presents to the ED with dizziness, weakness.  Patient with unremarkable vitals.  No fever.  Patient has had some dizziness, generalized weakness since yesterday morning at 9 AM.  States that she is too weak to walk.  She lives at home by herself.  Denies any falls.  Denies any chest pain, shortness of breath, fever, chills.  Patient states that overall she just feels drunk.  Neurologically she appears intact.  She has generalized weakness to all extremities equally.  States that she feels both dizziness with rest and movement.  Has some blurred vision in the left eye.  Overall there is no acute signs of stroke.  However given her dizziness could be peripheral or central.  Will treat with meclizine.  EKG shows sinus rhythm.  Will evaluate pacemaker.  Pacemaker with no issues.  Patient appears to have acute kidney injury with creatinine of 3.23.  Hypokalemia 2.6.  Sodium is 129.  Chest x-ray overall with no acute findings.  No signs of major volume overload.  CT scan of the head showed some sinusitis but otherwise unremarkable for intracranial process.  No concern for infectious process.  Likely symptoms are secondary to dehydration.  Will start gentle hydration with IV fluids at 75 an hour given her history of heart failure.  Less likely from stroke.  She may need an MRI/MRA of her brain inpatient to further evaluate for stroke.  She does have a pacemaker however.  Hemodynamically stable throughout my care.  To be admitted for further care.  This chart was dictated using voice recognition software.  Despite best efforts to  proofread,  errors can occur which can change the documentation meaning.     EKG Interpretation  Date/Time:  Monday April 15 2019 19:35:52 EDT Ventricular Rate:  60 PR Interval:    QRS Duration: 176 QT Interval:  438 QTC Calculation: 438 R Axis:   -39 Text Interpretation:  Sinus rhythm Probable left atrial enlargement Nonspecific IVCD with LAD Left ventricular hypertrophy  Lateral leads are also involved No significant change since last tracing Confirmed by Lennice Sites [phone]) on 04/15/2019 7:42:38 PM         Lennice Sites, DO 04/15/19 2203

## 2019-04-15 NOTE — ED Provider Notes (Signed)
[facility] EMERGENCY DEPARTMENT Provider Note   CSN: [id number] Arrival date & time: 04/15/19  1933     History   Chief Complaint Chief Complaint  Patient presents with  . Dizziness    HPI Stephanie Yates is a 73 y.o. female.     HPI   Patient presents for feeling "drunk" and not herself since around 9 AM yesterday morning.  She has a past medical history significant for chronic kidney disease stage IV, HTN, pacemaker with known reduced ejection fraction, atrial fibrillation on Eliquis.  She states that she has felt generalized weakness, dizziness.  She denies any other symptoms beyond emesis in her with EMS x1, nonbloody nonbilious.  She normally lives at home by herself, and she states that she has been too weak to ambulate or go to the restroom.  Denies any recent trauma, falls, head injury.  Denies any cough, chest pain, fever, chills.  She has not tried anything for her symptoms, no aggravating or alleviating factors.  Past Medical History:  Diagnosis Date  . Chronic anemia   . Chronic systolic CHF (congestive heart failure) ([place])   . CKD (chronic kidney disease), stage IV ([place])   . Former tobacco use   . History of tobacco abuse   . Hyperlipidemia   . Hypertension   . Mitral regurgitation   . Nonischemic cardiomyopathy ([place])    a. EF 10-15% on 11/2012 cath with no significant CAD. b. EF 25% in 2019. b. EF 15% in 12/2018  . PAF (paroxysmal atrial fibrillation) ([place])   . Protein calorie malnutrition ([place])   . Quit consuming alcohol in remote past   . Sinus bradycardia 12/21/2012  . Ventricular tachycardia ([place]) 12/14/2012    Patient Active Problem List   Diagnosis Date Noted  . Vertigo 04/15/2019  . Abnormal liver function 04/15/2019  . Right adrenal mass ([place]) possible adenoma 03/18/2019  . Nephrolithiasis 03/18/2019  . Prolonged QT interval 03/17/2019  . Renal failure (ARF), acute on chronic ([place]) 03/17/2019  . Nausea and vomiting 03/17/2019   . Epigastric abdominal pain 01/29/2019  . Gastritis 01/28/2019  . CKD (chronic kidney disease) stage 4, GFR 15-29 ml/min (HCC) 01/28/2019  . VT (ventricular tachycardia) ([place]) 01/17/2019  . AKI (acute kidney injury) ([place]) 01/12/2019  . Palliative care encounter   . CHF exacerbation ([place]) 01/06/2019  . Influenza A 09/02/2018  . Lactic acidosis 09/02/2018  . Chronic combined systolic and diastolic congestive heart failure ([place]) 09/02/2018  . Rapid atrial fibrillation ([place]) 06/08/2018  . Acute on chronic combined systolic and diastolic heart failure ([place]) 06/07/2018  . Anemia 06/07/2018  . Acute GI bleeding 06/15/2017  . Chronic anticoagulation   . Acute on chronic congestive heart failure ([place])   . HLD (hyperlipidemia) 03/05/2017  . CKD (chronic kidney disease), stage III 03/05/2017  . Chest pain 03/05/2017  . Symptomatic anemia 03/05/2017  . PAF (paroxysmal atrial fibrillation) ([place]) 04/01/2013  . ICD (implantable cardioverter-defibrillator), dual, st Judes 04/01/2013  . PVC's (premature ventricular contractions) 04/01/2013  . Hypokalemia 12/21/2012  . History of tobacco abuse 12/21/2012  . Transaminitis 12/21/2012  . Sinus bradycardia 12/21/2012  . Hyperkalemia 12/20/2012  . Nonischemic cardiomyopathy ([place]) 12/20/2012  . UTI (urinary tract infection) 12/20/2012  . Acute on chronic combined systolic and diastolic CHF (congestive heart failure) ([place]) 12/20/2012  . Ventricular tachycardia ([place]) 12/14/2012  . Hypertension 12/14/2012    Past Surgical History:  Procedure Laterality Date  . BIOPSY  01/22/2019   Procedure: BIOPSY;  Surgeon: Wilford Corner, MD;  Location: [facility];  Service: Endoscopy;;  . COLONOSCOPY WITH PROPOFOL N/A 03/19/2019   Procedure: COLONOSCOPY WITH PROPOFOL;  Surgeon: Otis Brace, MD;  Location: [facility];  Service: Gastroenterology;  Laterality: N/A;  . ESOPHAGOGASTRODUODENOSCOPY (EGD) WITH PROPOFOL N/A 06/18/2017   Procedure:  ESOPHAGOGASTRODUODENOSCOPY (EGD) WITH PROPOFOL;  Surgeon: Ronnette Juniper, MD;  Location: [facility];  Service: Gastroenterology;  Laterality: N/A;  . ESOPHAGOGASTRODUODENOSCOPY (EGD) WITH PROPOFOL N/A 01/22/2019   Procedure: ESOPHAGOGASTRODUODENOSCOPY (EGD) WITH PROPOFOL;  Surgeon: Wilford Corner, MD;  Location: [facility];  Service: Endoscopy;  Laterality: N/A;  . ESOPHAGOGASTRODUODENOSCOPY (EGD) WITH PROPOFOL N/A 03/19/2019   Procedure: ESOPHAGOGASTRODUODENOSCOPY (EGD) WITH PROPOFOL;  Surgeon: Otis Brace, MD;  Location: [facility] ENDOSCOPY;  Service: Gastroenterology;  Laterality: N/A;  . IMPLANTABLE CARDIOVERTER DEFIBRILLATOR IMPLANT  12/19/2012   St. Jude dual-chamber ICD, serial number [id number]  . IMPLANTABLE CARDIOVERTER DEFIBRILLATOR IMPLANT N/A 12/19/2012   Procedure: IMPLANTABLE CARDIOVERTER DEFIBRILLATOR IMPLANT;  Surgeon: Deboraha Sprang, MD;  Location: [facility] CATH LAB;  Service: Cardiovascular;  Laterality: N/A;  . LEFT AND RIGHT HEART CATHETERIZATION WITH CORONARY ANGIOGRAM N/A 12/17/2012   Procedure: LEFT AND RIGHT HEART CATHETERIZATION WITH CORONARY ANGIOGRAM;  Surgeon: Sherren Mocha, MD;  Location: [facility] CATH LAB;  Service: Cardiovascular;  Laterality: N/A;  . POLYPECTOMY  03/19/2019   Procedure: POLYPECTOMY;  Surgeon: Otis Brace, MD;  Location: [facility] ENDOSCOPY;  Service: Gastroenterology;;  . SCLEROTHERAPY  01/22/2019   Procedure: Clide Deutscher;  Surgeon: Wilford Corner, MD;  Location: [facility] ENDOSCOPY;  Service: Endoscopy;;     OB History   No obstetric history on file.      Home Medications    Prior to Admission medications   Medication Sig Start Date End Date Taking? Authorizing Provider  amiodarone (PACERONE) 200 MG tablet Take 1 tablet (200 mg total) by mouth daily. 02/15/19   [name], PA-C  apixaban (ELIQUIS) 2.5 MG TABS tablet Take 1 tablet (2.5 mg total) by mouth 2 (two) times daily. 03/19/19   Debbe Odea, MD  atorvastatin (LIPITOR) 40 MG tablet Take 1 tablet  (40 mg total) by mouth daily at 6 PM. 07/05/18   Deboraha Sprang, MD  carvedilol (COREG) 3.125 MG tablet Take 1 tablet (3.125 mg total) by mouth 2 (two) times daily with a meal. 09/12/18   Deboraha Sprang, MD  Cholecalciferol (VITAMIN D-3) 25 MCG (1000 UT) CAPS Take 1,000 Units by mouth at bedtime.     [provider]  Ensure (ENSURE) Take 237 mLs by mouth daily.    [provider]  ferrous gluconate (FERGON) 324 MG tablet Take 324 mg by mouth 2 (two) times daily.  07/05/18   [provider]  furosemide (LASIX) 80 MG tablet Take 1 tablet (80 mg total) by mouth daily. 07/05/18   Deboraha Sprang, MD  hydrALAZINE (APRESOLINE) 25 MG tablet Take 0.5 tablets (12.5 mg total) by mouth 3 (three) times daily. 01/12/19   Guilford Shi, MD  isosorbide mononitrate (IMDUR) 30 MG 24 hr tablet Take 0.5 tablets (15 mg total) by mouth daily. 01/13/19   Guilford Shi, MD  levalbuterol (XOPENEX HFA) 45 MCG/ACT inhaler Inhale 1 puff into the lungs every 4 (four) hours as needed for wheezing. Patient not taking: Reported on 02/23/2019 01/12/19 01/12/20  Guilford Shi, MD  MAGNESIUM-OXIDE 400 (241.3 Mg) MG tablet TAKE 1 TABLET BY MOUTH ONCE DAILY Patient taking differently: Take 400 mg by mouth daily as needed (antacid).  02/07/19   Deboraha Sprang, MD  metolazone (ZAROXOLYN)  2.5 MG tablet Take 1 tablet (2.5 mg total) by mouth daily as needed (leg swellings or shortness of breath or weight gain>5 pounds in 48 hours). 01/12/19   Guilford Shi, MD  mexiletine (MEXITIL) 200 MG capsule Take 1 capsule (200 mg total) by mouth 2 (two) times daily. 01/24/19   [name], PA-C  nitroGLYCERIN (NITROSTAT) 0.4 MG SL tablet Place 1 tablet (0.4 mg total) under the tongue every 5 (five) minutes as needed for chest pain. 01/12/19   Guilford Shi, MD  ondansetron (ZOFRAN ODT) 4 MG disintegrating tablet Take 1 tablet (4 mg total) by mouth every 8 (eight) hours as needed for nausea or vomiting. 03/21/19    [place] Donnamarie Poag, DO  pantoprazole (PROTONIX) 40 MG tablet Take 1 tablet (40 mg total) by mouth 2 (two) times daily before a meal for 14 days. 03/21/19 04/04/19  [place] Eric J, DO  pantoprazole (PROTONIX) 40 MG tablet Take 1 tablet (40 mg total) by mouth daily. 04/05/19 07/04/19  [place] Donnamarie Poag, DO  polyethylene glycol (MIRALAX / GLYCOLAX) 17 g packet Take 17 g by mouth daily. Can take up to twice a day for constipation. 03/19/19   Debbe Odea, MD  sucralfate (CARAFATE) 1 GM/10ML suspension Take 10 mLs (1 g total) by mouth 4 (four) times daily -  with meals and at bedtime. 03/19/19   Debbe Odea, MD    Family History Family History  Problem Relation Age of Onset  . Cancer Mother     Social History Social History   Tobacco Use  . Smoking status: Former Research scientist (life sciences)  . Smokeless tobacco: Never Used  Substance Use Topics  . Alcohol use: No  . Drug use: No     Allergies   Strawberry extract   Review of Systems Review of Systems  Constitutional: Positive for activity change. Negative for chills and fever.  Eyes: Positive for visual disturbance (left eye). Negative for pain.  Respiratory: Negative for cough and shortness of breath.   Cardiovascular: Negative for chest pain and palpitations.  Gastrointestinal: Positive for vomiting. Negative for abdominal pain and nausea.  Genitourinary: Negative for dysuria.  Skin: Negative for rash and wound.  Neurological: Positive for dizziness and weakness. Negative for syncope.  All other systems reviewed and are negative.    Physical Exam Updated Vital Signs BP (!) 125/56   Pulse 60   Temp 98.1 F (36.7 C) (Oral)   Resp 17   Ht 5' (1.524 m)   Wt 59 kg   SpO2 94%   BMI 25.39 kg/m   Physical Exam Vitals signs and nursing note reviewed.  Constitutional:      Appearance: She is well-developed.     Comments: Very thin appearance  HENT:     Head: Normocephalic and atraumatic.     Mouth/Throat:     Mouth: Mucous membranes are moist.   Eyes:     Conjunctiva/sclera: Conjunctivae normal.  Neck:     Musculoskeletal: Neck supple.  Cardiovascular:     Rate and Rhythm: Normal rate and regular rhythm.     Heart sounds: No murmur.  Pulmonary:     Effort: Pulmonary effort is normal. No respiratory distress.     Breath sounds: Normal breath sounds.  Abdominal:     General: Bowel sounds are normal. There is no distension.     Palpations: Abdomen is soft.     Tenderness: There is no abdominal tenderness. There is no guarding or rebound.  Skin:    General: Skin  is warm and dry.  Neurological:     General: No focal deficit present.     Mental Status: She is alert and oriented to person, place, and time.     Motor: Weakness present.     Comments: Patient is globally weak, no focal deficit noted, unable to move extremities or sit up without assistance      ED Treatments / Results  Labs (all labs ordered are listed, but only abnormal results are displayed) Labs Reviewed  APTT - Abnormal; Notable for the following components:      Result Value   aPTT 57 (*)    All other components within normal limits  PROTIME-INR - Abnormal; Notable for the following components:   Prothrombin Time 25.5 (*)    INR 2.4 (*)    All other components within normal limits  BRAIN NATRIURETIC PEPTIDE - Abnormal; Notable for the following components:   B Natriuretic Peptide 566.4 (*)    All other components within normal limits  CBC WITH DIFFERENTIAL/PLATELET - Abnormal; Notable for the following components:   RDW 17.2 (*)    All other components within normal limits  COMPREHENSIVE METABOLIC PANEL - Abnormal; Notable for the following components:   Sodium 129 (*)    Potassium 2.6 (*)    Chloride 93 (*)    CO2 21 (*)    Glucose, Bld 112 (*)    BUN 56 (*)    Creatinine, Ser 3.23 (*)    Calcium 8.7 (*)    Albumin 3.2 (*)    AST 112 (*)    ALT 60 (*)    GFR calc non Af Amer 13 (*)    GFR calc Af Amer 15 (*)    All other components  within normal limits  TROPONIN I (HIGH SENSITIVITY) - Abnormal; Notable for the following components:   Troponin I (High Sensitivity) 22 (*)    All other components within normal limits  SARS CORONAVIRUS 2 (TAT 6-24 HRS)  LACTIC ACID, PLASMA  CBC WITH DIFFERENTIAL/PLATELET  URINALYSIS, ROUTINE W REFLEX MICROSCOPIC  CK  COMPREHENSIVE METABOLIC PANEL  CBC  TROPONIN I (HIGH SENSITIVITY)    EKG EKG Interpretation  Date/Time:  Monday April 15 2019 19:35:52 EDT Ventricular Rate:  60 PR Interval:    QRS Duration: 176 QT Interval:  438 QTC Calculation: 438 R Axis:   -39 Text Interpretation:  Sinus rhythm Probable left atrial enlargement Nonspecific IVCD with LAD Left ventricular hypertrophy  Lateral leads are also involved No significant change since last tracing Confirmed by Lennice Sites [phone]) on 04/15/2019 7:42:38 PM   Radiology Ct Head Wo Contrast  Result Date: 04/15/2019 CLINICAL DATA:  Altered level of consciousness EXAM: CT HEAD WITHOUT CONTRAST TECHNIQUE: Contiguous axial images were obtained from the base of the skull through the vertex without intravenous contrast. COMPARISON:  None. FINDINGS: Brain: No evidence of acute territorial infarction, hemorrhage, hydrocephalus,extra-axial collection or mass lesion/mass effect. There is mild dilatation the ventricles and sulci consistent with age-related atrophy. Low-attenuation changes in the deep white matter consistent with small vessel ischemia. Vascular: No hyperdense vessel or unexpected calcification. Skull: The skull is intact. No fracture or focal lesion identified. Sinuses/Orbits: There is near complete opacification of the frontal sinuses, sphenoid air cells, and bilateral maxillary sinuses. There is also ethmoid air cell mucosal thickening. Other: None IMPRESSION: 1. Findings suggestive sinusitis involving the frontal, sphenoid, bilateral maxillary sinuses. 2. No acute intracranial pathology. 3. Findings consistent with mild  age related atrophy and chronic small vessel  ischemia Electronically Signed   By: Prudencio Pair M.D.   On: 04/15/2019 21:44   Dg Chest Portable 1 View  Result Date: 04/15/2019 CLINICAL DATA:  Chest pain, shortness of breath. EXAM: PORTABLE CHEST 1 VIEW COMPARISON:  January 07, 2019. FINDINGS: Stable cardiomegaly. Atherosclerosis of thoracic aorta is noted. No pneumothorax or pleural effusion is noted. Right lung is clear. Minimal left basilar subsegmental atelectasis is noted. Left-sided pacemaker is in grossly good position. Bony thorax is unremarkable. IMPRESSION: Minimal left basilar subsegmental atelectasis. Aortic Atherosclerosis (ICD10-I70.0). Electronically Signed   By: Marijo Conception M.D.   On: 04/15/2019 20:26    Procedures Procedures (including critical care time)  Medications Ordered in ED Medications  lactated ringers infusion ( Intravenous New Bag/Given 04/15/19 2212)  potassium chloride 10 mEq in 100 mL IVPB (10 mEq Intravenous New Bag/Given 04/15/19 2212)  sodium chloride flush (NS) 0.9 % injection 3 mL (has no administration in time range)  sodium chloride flush (NS) 0.9 % injection 3 mL (has no administration in time range)  0.9 %  sodium chloride infusion (has no administration in time range)  acetaminophen (TYLENOL) tablet 650 mg (has no administration in time range)    Or  acetaminophen (TYLENOL) suppository 650 mg (has no administration in time range)  meclizine (ANTIVERT) tablet 12.5 mg (12.5 mg Oral Given 04/15/19 2016)     Initial Impression / Assessment and Plan / ED Course  I have reviewed the triage vital signs and the nursing notes.  Pertinent labs & imaging results that were available during my care of the patient were reviewed by me and considered in my medical decision making (see chart for details).        SHANTI AGRESTI is a 73 y.o. female Patient presents for feeling "drunk" and not herself since around 9 AM yesterday morning.  She has a past medical  history significant for chronic kidney disease stage IV, HTN, pacemaker with known reduced ejection fraction, atrial fibrillation on Eliquis.  Patient's dizziness is present with both rest and movement, constant.  Labs ordered, CT head ordered, pacemaker interrogation ordered.  Pacemaker does not appear to have any issues.  EKG shows sinus rhythm.  Labs show an acute kidney injury with a creatinine of 3.23, with a baseline normally near 1-2.  Hyperkalemia present was 2.6.  Sodium 129.  IV potassium repletion started in the ED.  Gentle repletion initiated with LR infusion.  Chest x-ray showed no acute findings.  CT scan of the head showed no acute abnormality and sinusitis.    Patient may need an MRI/MRA of her brain at some point to determine if she has had an early stroke if she does not improve with hydration.  CK added onto lab values.  Admitted to hospitalist for further evaluation and management.  Final Clinical Impressions(s) / ED Diagnoses   Final diagnoses:  Dehydration  Dizziness  Hypokalemia    ED Discharge Orders    None       Julianne Rice, MD 04/15/19 [place], [place], DO 04/15/19 2339

## 2019-04-15 NOTE — Progress Notes (Signed)
ANTICOAGULATION CONSULT NOTE - Initial Consult  Pharmacy Consult for apixaban Indication: atrial fibrillation  Allergies  Allergen Reactions  . Strawberry Extract Hives, Itching and Swelling         Patient Measurements: Height: 5' (152.4 cm) Weight: 130 lb (59 kg) IBW/kg (Calculated) : 45.5 Heparin Dosing Weight: 57.5 kg  Vital Signs: Temp: 98.1 F (36.7 C) (10/19 1938) Temp Source: Oral (10/19 1938) BP: 125/56 (10/19 2130) Pulse Rate: 60 (10/19 2130)  Labs: Recent Labs    04/15/19 2001  HGB 12.5  HCT 38.2  PLT 336  APTT 57*  LABPROT 25.5*  INR 2.4*  CREATININE 3.23*  TROPONINIHS 22*    Estimated Creatinine Clearance: 12.1 mL/min (A) (by C-G formula based on SCr of 3.23 mg/dL (H)).   Medical History: Past Medical History:  Diagnosis Date  . Chronic anemia   . Chronic systolic CHF (congestive heart failure) ([place])   . CKD (chronic kidney disease), stage IV ([place])   . Former tobacco use   . History of tobacco abuse   . Hyperlipidemia   . Hypertension   . Mitral regurgitation   . Nonischemic cardiomyopathy ([place])    a. EF 10-15% on 11/2012 cath with no significant CAD. b. EF 25% in 2019. b. EF 15% in 12/2018  . PAF (paroxysmal atrial fibrillation) ([place])   . Protein calorie malnutrition ([place])   . Quit consuming alcohol in remote past   . Sinus bradycardia 12/21/2012  . Ventricular tachycardia ([place]) 12/14/2012    Medications:  Scheduled:  . sodium chloride flush  3 mL Intravenous Q12H    Assessment: 75 yof presenting with dizziness and generalized weakness. Hx of Afib, on apixaban PTA - last dose on 10/19 prior to coming to ED (confirmed did get 2 doses in).  Hgb 12.5, plt 336. Trop 22. Scr 3.23- in AKI, possibly related to dehydration. No s/sx of bleeding.   Given wt and Scr, will continue PTA dose of apixaban 2.5 mg twice daily.   Goal of Therapy:  Monitor platelets by anticoagulation protocol: Yes   Plan:  Apixaban 2.5 mg twice daily Monitor  renal function, CBC, and for s/sx of bleeding  Antonietta Jewel, PharmD, BCCCP Clinical Pharmacist  Phone: [phone]  Please check AMION for all [place] phone numbers After 10:00 PM, call [place] [phone] 04/15/2019,10:35 PM

## 2019-04-15 NOTE — ED Triage Notes (Signed)
BIB [facility] from home with c/o of dizziness X 1 day with worsening when standing. Orthostatic vitals negative with EMS. Hx of CHF. VSS on arrival.

## 2019-04-15 NOTE — ED Notes (Signed)
Patient transported to CT 

## 2019-04-15 NOTE — ED Notes (Signed)
Pacemaker Interrogated. 

## 2019-04-15 NOTE — H&P (Addendum)
TRH H&P    Patient Demographics:    Stephanie Yates, is a 73 y.o. female  MRN: [id number]  DOB - [birth year]  Admit Date - 04/15/2019  Referring MD/NP/PA: Julianne Rice  Outpatient Primary MD for the patient is Josetta Huddle, MD  Patient coming from:  home  Chief complaint- vertigo   HPI:    Stephanie Yates  is a 73 y.o. female,w NICM w EF 15% s/o ICD, Pafib on Eliquis, hypertension, hyperlipidemia, ckd stage 4,  Gerd, esophagitis, chronic anemia, who presents with dizziness for the past day which she describes as vertigo. It started at [facility] Sunday.  Associated with nausea.  Pt denies headache, ear infection, hearing loss, tinnitus, or family hx of vertigo.  Pt notes that her left leg is slightly weak.  Pt notes slight weakness in the left arm ?  In ED,  T 98.1, P 60 R 15, Bp 152/64  Pox 96% on RA Wt 59kg  CT brain IMPRESSION: 1. Findings suggestive sinusitis involving the frontal, sphenoid, bilateral maxillary sinuses. 2. No acute intracranial pathology. 3. Findings consistent with mild age related atrophy and chronic small vessel ischemia  CXR IMPRESSION: Minimal left basilar subsegmental atelectasis.  Aortic Atherosclerosis (ICD10-I70.0).  Wbc 10.0, hgb 12.5, Plt 336 Na 129, K 2.6,  Bun 56, creatinine 3.23 (baseline 1.65-1.8) Ast 112, Alt 60, Alk phos 87, T. Bili 1.1 Lactic acid 1.3 BNP 566.4 INR 2.4  Trop 22  Ekg nsr at 60, Lad, incomplete LBBB,   Pt will be admitted for vertigo and left leg weakness, and hypokalemia, and acute on chronic renal failure        Review of systems:    In addition to the HPI above,  No Fever-chills, No Headache, No changes with Vision or hearing, No problems swallowing food or Liquids, No Chest pain, Cough or Shortness of Breath, No Abdominal pain, No Nausea or Vomiting, bowel movements are regular, No Blood in stool or Urine, No  dysuria, No new skin rashes or bruises, No new joints pains-aches,   No recent weight gain or loss, No polyuria, polydypsia or polyphagia, No significant Mental Stressors.  All other systems reviewed and are negative.    Past History of the following :    Past Medical History:  Diagnosis Date  . Chronic anemia   . Chronic systolic CHF (congestive heart failure) ([place])   . CKD (chronic kidney disease), stage IV ([place])   . Former tobacco use   . History of tobacco abuse   . Hyperlipidemia   . Hypertension   . Mitral regurgitation   . Nonischemic cardiomyopathy ([place])    a. EF 10-15% on 11/2012 cath with no significant CAD. b. EF 25% in 2019. b. EF 15% in 12/2018  . PAF (paroxysmal atrial fibrillation) ([place])   . Protein calorie malnutrition ([place])   . Quit consuming alcohol in remote past   . Sinus bradycardia 12/21/2012  . Ventricular tachycardia ([place]) 12/14/2012      Past Surgical History:  Procedure Laterality Date  . BIOPSY  01/22/2019  Procedure: BIOPSY;  Surgeon: Wilford Corner, MD;  Location: [place];  Service: Endoscopy;;  . COLONOSCOPY WITH PROPOFOL N/A 03/19/2019   Procedure: COLONOSCOPY WITH PROPOFOL;  Surgeon: Otis Brace, MD;  Location: [place];  Service: Gastroenterology;  Laterality: N/A;  . ESOPHAGOGASTRODUODENOSCOPY (EGD) WITH PROPOFOL N/A 06/18/2017   Procedure: ESOPHAGOGASTRODUODENOSCOPY (EGD) WITH PROPOFOL;  Surgeon: Ronnette Juniper, MD;  Location: [place];  Service: Gastroenterology;  Laterality: N/A;  . ESOPHAGOGASTRODUODENOSCOPY (EGD) WITH PROPOFOL N/A 01/22/2019   Procedure: ESOPHAGOGASTRODUODENOSCOPY (EGD) WITH PROPOFOL;  Surgeon: Wilford Corner, MD;  Location: [place];  Service: Endoscopy;  Laterality: N/A;  . ESOPHAGOGASTRODUODENOSCOPY (EGD) WITH PROPOFOL N/A 03/19/2019   Procedure: ESOPHAGOGASTRODUODENOSCOPY (EGD) WITH PROPOFOL;  Surgeon: Otis Brace, MD;  Location: [facility] ENDOSCOPY;  Service: Gastroenterology;  Laterality: N/A;   . IMPLANTABLE CARDIOVERTER DEFIBRILLATOR IMPLANT  12/19/2012   St. Jude dual-chamber ICD, serial number [id number]  . IMPLANTABLE CARDIOVERTER DEFIBRILLATOR IMPLANT N/A 12/19/2012   Procedure: IMPLANTABLE CARDIOVERTER DEFIBRILLATOR IMPLANT;  Surgeon: Deboraha Sprang, MD;  Location: [facility] CATH LAB;  Service: Cardiovascular;  Laterality: N/A;  . LEFT AND RIGHT HEART CATHETERIZATION WITH CORONARY ANGIOGRAM N/A 12/17/2012   Procedure: LEFT AND RIGHT HEART CATHETERIZATION WITH CORONARY ANGIOGRAM;  Surgeon: Sherren Mocha, MD;  Location: [facility] CATH LAB;  Service: Cardiovascular;  Laterality: N/A;  . POLYPECTOMY  03/19/2019   Procedure: POLYPECTOMY;  Surgeon: Otis Brace, MD;  Location: [facility];  Service: Gastroenterology;;  . SCLEROTHERAPY  01/22/2019   Procedure: Clide Deutscher;  Surgeon: Wilford Corner, MD;  Location: [facility] ENDOSCOPY;  Service: Endoscopy;;      Social History:      Social History   Tobacco Use  . Smoking status: Former Research scientist (life sciences)  . Smokeless tobacco: Never Used  Substance Use Topics  . Alcohol use: No       Family History :     Family History  Problem Relation Age of Onset  . Cancer Mother        Home Medications:   Prior to Admission medications   Medication Sig Start Date End Date Taking? Authorizing Provider  amiodarone (PACERONE) 200 MG tablet Take 1 tablet (200 mg total) by mouth daily. 02/15/19   [name], PA-C  apixaban (ELIQUIS) 2.5 MG TABS tablet Take 1 tablet (2.5 mg total) by mouth 2 (two) times daily. 03/19/19   Debbe Odea, MD  atorvastatin (LIPITOR) 40 MG tablet Take 1 tablet (40 mg total) by mouth daily at 6 PM. 07/05/18   Deboraha Sprang, MD  carvedilol (COREG) 3.125 MG tablet Take 1 tablet (3.125 mg total) by mouth 2 (two) times daily with a meal. 09/12/18   Deboraha Sprang, MD  Cholecalciferol (VITAMIN D-3) 25 MCG (1000 UT) CAPS Take 1,000 Units by mouth at bedtime.     [provider]  Ensure (ENSURE) Take 237 mLs by mouth daily.     [provider]  ferrous gluconate (FERGON) 324 MG tablet Take 324 mg by mouth 2 (two) times daily.  07/05/18   [provider]  furosemide (LASIX) 80 MG tablet Take 1 tablet (80 mg total) by mouth daily. 07/05/18   Deboraha Sprang, MD  hydrALAZINE (APRESOLINE) 25 MG tablet Take 0.5 tablets (12.5 mg total) by mouth 3 (three) times daily. 01/12/19   Guilford Shi, MD  isosorbide mononitrate (IMDUR) 30 MG 24 hr tablet Take 0.5 tablets (15 mg total) by mouth daily. 01/13/19   Guilford Shi, MD  levalbuterol (XOPENEX HFA) 45 MCG/ACT inhaler Inhale 1 puff into the lungs every 4 (  four) hours as needed for wheezing. Patient not taking: Reported on 02/23/2019 01/12/19 01/12/20  Guilford Shi, MD  MAGNESIUM-OXIDE 400 (241.3 Mg) MG tablet TAKE 1 TABLET BY MOUTH ONCE DAILY Patient taking differently: Take 400 mg by mouth daily as needed (antacid).  02/07/19   Deboraha Sprang, MD  metolazone (ZAROXOLYN) 2.5 MG tablet Take 1 tablet (2.5 mg total) by mouth daily as needed (leg swellings or shortness of breath or weight gain>5 pounds in 48 hours). 01/12/19   Guilford Shi, MD  mexiletine (MEXITIL) 200 MG capsule Take 1 capsule (200 mg total) by mouth 2 (two) times daily. 01/24/19   [name], PA-C  nitroGLYCERIN (NITROSTAT) 0.4 MG SL tablet Place 1 tablet (0.4 mg total) under the tongue every 5 (five) minutes as needed for chest pain. 01/12/19   Guilford Shi, MD  ondansetron (ZOFRAN ODT) 4 MG disintegrating tablet Take 1 tablet (4 mg total) by mouth every 8 (eight) hours as needed for nausea or vomiting. 03/21/19   [place] Donnamarie Poag, DO  pantoprazole (PROTONIX) 40 MG tablet Take 1 tablet (40 mg total) by mouth 2 (two) times daily before a meal for 14 days. 03/21/19 04/04/19  [place] Eric J, DO  pantoprazole (PROTONIX) 40 MG tablet Take 1 tablet (40 mg total) by mouth daily. 04/05/19 07/04/19  [place] Donnamarie Poag, DO  polyethylene glycol (MIRALAX / GLYCOLAX) 17 g packet Take 17 g by mouth  daily. Can take up to twice a day for constipation. 03/19/19   Debbe Odea, MD  sucralfate (CARAFATE) 1 GM/10ML suspension Take 10 mLs (1 g total) by mouth 4 (four) times daily -  with meals and at bedtime. 03/19/19   Debbe Odea, MD     Allergies:     Allergies  Allergen Reactions  . Strawberry Extract Hives, Itching and Swelling          Physical Exam:   Vitals  Blood pressure (!) 125/56, pulse 60, temperature 98.1 F (36.7 C), temperature source Oral, resp. rate 17, height 5' (1.524 m), weight 59 kg, SpO2 94 %.  1.  General: axoxo3  2. Psychiatric: euthymic  3. Neurologic: cn2-12 intact, reflexes 2+ symmetric, diffuse with down going toe on the right, equivocal on the left, motor 5/5 in right upper and lower ext, and left upper ext, and 4/5 in the left lower ext Slight difficulty with finger to nose, no obvious vision deficit  4. HEENMT:  Anicteric, pupils 1.57m symmetric, direct, consensual, near intact Neck: no jvd  5. Respiratory : CTAB  6. Cardiovascular : rrr s1, s2,   7. Gastrointestinal:  Abd: soft, nt, nd, +bs  8. Skin:  Ext: no c/c/e, no rash  9.Musculoskeletal:  Good ROM    Data Review:    CBC Recent Labs  Lab 04/15/19 2001  WBC 10.0  HGB 12.5  HCT 38.2  PLT 336  MCV 82.3  MCH 26.9  MCHC 32.7  RDW 17.2*  LYMPHSABS 1.9  MONOABS 1.0  EOSABS 0.1  BASOSABS 0.1   ------------------------------------------------------------------------------------------------------------------  Results for orders placed or performed during the hospital encounter of 04/15/19 (from the past 48 hour(s))  APTT     Status: Abnormal   Collection Time: 04/15/19  8:01 PM  Result Value Ref Range   aPTT 57 (H) 24 - 36 seconds    Comment:        IF BASELINE aPTT IS ELEVATED, SUGGEST PATIENT RISK ASSESSMENT BE USED TO DETERMINE APPROPRIATE ANTICOAGULANT THERAPY. Performed at [facility], [address], [place], [place] [address]   Protime-INR      Status: Abnormal   Collection Time: 04/15/19  8:01 PM  Result Value Ref Range   Prothrombin Time 25.5 (H) 11.4 - 15.2 seconds   INR 2.4 (H) 0.8 - 1.2    Comment: (NOTE) INR goal varies based on device and disease states. Performed at [facility], [place] [address], [place], [place] [address]   Troponin I (High Sensitivity)     Status: Abnormal   Collection Time: 04/15/19  8:01 PM  Result Value Ref Range   Troponin I (High Sensitivity) 22 (H) <18 ng/L    Comment: (NOTE) Elevated high sensitivity troponin I (hsTnI) values and significant  changes across serial measurements may suggest ACS but many other  chronic and acute conditions are known to elevate hsTnI results.  Refer to the Links section for chest pain algorithms and additional  guidance. Performed at [facility], [place] [address], [place], [place] [address]   CBC with Differential/Platelet     Status: Abnormal   Collection Time: 04/15/19  8:01 PM  Result Value Ref Range   WBC 10.0 4.0 - 10.5 K/uL   RBC 4.64 3.87 - 5.11 MIL/uL   Hemoglobin 12.5 12.0 - 15.0 g/dL   HCT 38.2 36.0 - 46.0 %   MCV 82.3 80.0 - 100.0 fL   MCH 26.9 26.0 - 34.0 pg   MCHC 32.7 30.0 - 36.0 g/dL   RDW 17.2 (H) 11.5 - 15.5 %   Platelets 336 150 - 400 K/uL   nRBC 0.0 0.0 - 0.2 %   Neutrophils Relative % 68 %   Neutro Abs 6.8 1.7 - 7.7 K/uL   Lymphocytes Relative 19 %   Lymphs Abs 1.9 0.7 - 4.0 K/uL   Monocytes Relative 10 %   Monocytes Absolute 1.0 0.1 - 1.0 K/uL   Eosinophils Relative 1 %   Eosinophils Absolute 0.1 0.0 - 0.5 K/uL   Basophils Relative 1 %   Basophils Absolute 0.1 0.0 - 0.1 K/uL   Immature Granulocytes 1 %   Abs Immature Granulocytes 0.05 0.00 - 0.07 K/uL    Comment: Performed at [facility] [address], [place], [place] [address]  Comprehensive metabolic panel     Status: Abnormal   Collection Time: 04/15/19  8:01 PM  Result Value Ref Range   Sodium 129 (L) 135 - 145 mmol/L   Potassium 2.6 (LL) 3.5 -  5.1 mmol/L    Comment: CRITICAL RESULT CALLED TO, READ BACK BY AND VERIFIED WITH: RN T CAMPBELL @2112  04/15/19 BY S GEZAHEGN    Chloride 93 (L) 98 - 111 mmol/L   CO2 21 (L) 22 - 32 mmol/L   Glucose, Bld 112 (H) 70 - 99 mg/dL   BUN 56 (H) 8 - 23 mg/dL   Creatinine, Ser 3.23 (H) 0.44 - 1.00 mg/dL   Calcium 8.7 (L) 8.9 - 10.3 mg/dL   Total Protein 7.7 6.5 - 8.1 g/dL   Albumin 3.2 (L) 3.5 - 5.0 g/dL   AST 112 (H) 15 - 41 U/L   ALT 60 (H) 0 - 44 U/L   Alkaline Phosphatase 87 38 - 126 U/L   Total Bilirubin 1.1 0.3 - 1.2 mg/dL   GFR calc non Af Amer 13 (L) >60 mL/min   GFR calc Af Amer 15 (L) >60 mL/min   Anion gap 15 5 - 15    Comment: Performed at [facility],  [address] [place], [place] [address]  Lactic acid, plasma     Status: None   Collection Time: 04/15/19  8:18 PM  Result Value Ref Range   Lactic Acid, Venous 1.3 0.5 - 1.9 mmol/L    Comment: Performed at [facility] [address], [place], [place] [address]  Brain natriuretic peptide     Status: Abnormal   Collection Time: 04/15/19  8:22 PM  Result Value Ref Range   B Natriuretic Peptide 566.4 (H) 0.0 - 100.0 pg/mL    Comment: Performed at [facility] [address], [place], [place] [address]    Chemistries  Recent Labs  Lab 04/15/19 2001  NA 129*  K 2.6*  CL 93*  CO2 21*  GLUCOSE 112*  BUN 56*  CREATININE 3.23*  CALCIUM 8.7*  AST 112*  ALT 60*  ALKPHOS 87  BILITOT 1.1   ------------------------------------------------------------------------------------------------------------------  ------------------------------------------------------------------------------------------------------------------ GFR: Estimated Creatinine Clearance: 12.1 mL/min (A) (by C-G formula based on SCr of 3.23 mg/dL (H)). Liver Function Tests: Recent Labs  Lab 04/15/19 2001  AST 112*  ALT 60*  ALKPHOS 87  BILITOT 1.1  PROT 7.7  ALBUMIN 3.2*   No results for input(s): LIPASE, AMYLASE in the last  168 hours. No results for input(s): AMMONIA in the last 168 hours. Coagulation Profile: Recent Labs  Lab 04/15/19 2001  INR 2.4*   Cardiac Enzymes: No results for input(s): CKTOTAL, CKMB, CKMBINDEX, TROPONINI in the last 168 hours. BNP (last 3 results) No results for input(s): PROBNP in the last 8760 hours. HbA1C: No results for input(s): HGBA1C in the last 72 hours. CBG: No results for input(s): GLUCAP in the last 168 hours. Lipid Profile: No results for input(s): CHOL, HDL, LDLCALC, TRIG, CHOLHDL, LDLDIRECT in the last 72 hours. Thyroid Function Tests: No results for input(s): TSH, T4TOTAL, FREET4, T3FREE, THYROIDAB in the last 72 hours. Anemia Panel: No results for input(s): VITAMINB12, FOLATE, FERRITIN, TIBC, IRON, RETICCTPCT in the last 72 hours.  --------------------------------------------------------------------------------------------------------------- Urine analysis:    Component Value Date/Time   COLORURINE AMBER (A) 03/16/2019 2046   APPEARANCEUR HAZY (A) 03/16/2019 2046   LABSPEC 1.009 03/16/2019 2046   PHURINE 7.0 03/16/2019 2046   GLUCOSEU NEGATIVE 03/16/2019 2046   HGBUR LARGE (A) 03/16/2019 2046   BILIRUBINUR NEGATIVE 03/16/2019 2046   KETONESUR NEGATIVE 03/16/2019 2046   PROTEINUR 30 (A) 03/16/2019 2046   UROBILINOGEN 1.0 08/29/2014 2035   NITRITE NEGATIVE 03/16/2019 2046   LEUKOCYTESUR NEGATIVE 03/16/2019 2046      Imaging Results:    Ct Head Wo Contrast  Result Date: 04/15/2019 CLINICAL DATA:  Altered level of consciousness EXAM: CT HEAD WITHOUT CONTRAST TECHNIQUE: Contiguous axial images were obtained from the base of the skull through the vertex without intravenous contrast. COMPARISON:  None. FINDINGS: Brain: No evidence of acute territorial infarction, hemorrhage, hydrocephalus,extra-axial collection or mass lesion/mass effect. There is mild dilatation the ventricles and sulci consistent with age-related atrophy. Low-attenuation changes in the  deep white matter consistent with small vessel ischemia. Vascular: No hyperdense vessel or unexpected calcification. Skull: The skull is intact. No fracture or focal lesion identified. Sinuses/Orbits: There is near complete opacification of the frontal sinuses, sphenoid air cells, and bilateral maxillary sinuses. There is also ethmoid air cell mucosal thickening. Other: None IMPRESSION: 1. Findings suggestive sinusitis involving the frontal, sphenoid, bilateral maxillary sinuses. 2. No acute intracranial pathology. 3. Findings consistent with mild age related atrophy and chronic small vessel ischemia Electronically Signed   By:  Prudencio Pair M.D.   On: 04/15/2019 21:44   Dg Chest Portable 1 View  Result Date: 04/15/2019 CLINICAL DATA:  Chest pain, shortness of breath. EXAM: PORTABLE CHEST 1 VIEW COMPARISON:  January 07, 2019. FINDINGS: Stable cardiomegaly. Atherosclerosis of thoracic aorta is noted. No pneumothorax or pleural effusion is noted. Right lung is clear. Minimal left basilar subsegmental atelectasis is noted. Left-sided pacemaker is in grossly good position. Bony thorax is unremarkable. IMPRESSION: Minimal left basilar subsegmental atelectasis. Aortic Atherosclerosis (ICD10-I70.0). Electronically Signed   By: Marijo Conception M.D.   On: 04/15/2019 20:26       Assessment & Plan:    Principal Problem:   Vertigo Active Problems:   Hypertension   Hypokalemia   PAF (paroxysmal atrial fibrillation) (HCC)   Renal failure (ARF), acute on chronic (HCC)   Abnormal liver function  Vertigo ? CVA vs BPV curbsided neurology Pacer not MRI compatible Continue Eliquis Repeat CT brain tomorrow If condition changes please call them PT to evaluate and tx Meclizine 24m po q6h prn vertigo  ? Left leg weakness ? Hypokalemia vs CVA Hold Metolazone Replete potassium ,  Check magnesium Check cmp in am  Acute on chronic renal failure Hydrate gently with ns iv Check cmp in am  Abnormal liver  function Check acute hepatitis panel Check cmp in am  Pafib  Cont Eliquis pharmacy to dose Cont carvedilol 3.1226mpo bid  NICM s/p AICD Cont Carvedilol as above Cont Imdur 3075mo qday Cont Hydralazine Cont Lasix 40m43m qday, may need to adjust dose Nutrition consult  Hyperlipidemia Cont Lipitor 40mg90mqhs  Iron deficiency anemia Cont ferrous sulfate  Gerd  Cont PPI Cont Carafate  DVT Prophylaxis-   Eliquis- SCDs  AM Labs Ordered, also please review Full Orders  Family Communication: Admission, patients condition and plan of care including tests being ordered have been discussed with the patient  who indicate understanding and agree with the plan and Code Status.  Code Status:  FULL CODE per patient, [facility]ation: Based on patients clinical presentation and evaluation of above clinical data, I have made determination that patient meets observation criteria at this time.    Time spent in minutes : 70   JamesJani Gravelon 04/15/2019 at 10:23 PM

## 2019-04-16 ENCOUNTER — Encounter (HOSPITAL_COMMUNITY): Payer: Self-pay

## 2019-04-16 DIAGNOSIS — N179 Acute kidney failure, unspecified: Secondary | ICD-10-CM | POA: Diagnosis present

## 2019-04-16 DIAGNOSIS — Z20828 Contact with and (suspected) exposure to other viral communicable diseases: Secondary | ICD-10-CM | POA: Diagnosis present

## 2019-04-16 DIAGNOSIS — I447 Left bundle-branch block, unspecified: Secondary | ICD-10-CM | POA: Diagnosis present

## 2019-04-16 DIAGNOSIS — E860 Dehydration: Secondary | ICD-10-CM | POA: Diagnosis present

## 2019-04-16 DIAGNOSIS — E876 Hypokalemia: Secondary | ICD-10-CM | POA: Diagnosis present

## 2019-04-16 DIAGNOSIS — Z79899 Other long term (current) drug therapy: Secondary | ICD-10-CM | POA: Diagnosis not present

## 2019-04-16 DIAGNOSIS — I130 Hypertensive heart and chronic kidney disease with heart failure and stage 1 through stage 4 chronic kidney disease, or unspecified chronic kidney disease: Secondary | ICD-10-CM | POA: Diagnosis present

## 2019-04-16 DIAGNOSIS — Z95810 Presence of automatic (implantable) cardiac defibrillator: Secondary | ICD-10-CM | POA: Diagnosis not present

## 2019-04-16 DIAGNOSIS — N184 Chronic kidney disease, stage 4 (severe): Secondary | ICD-10-CM | POA: Diagnosis present

## 2019-04-16 DIAGNOSIS — Z7901 Long term (current) use of anticoagulants: Secondary | ICD-10-CM | POA: Diagnosis not present

## 2019-04-16 DIAGNOSIS — Z8719 Personal history of other diseases of the digestive system: Secondary | ICD-10-CM | POA: Diagnosis not present

## 2019-04-16 DIAGNOSIS — Z87891 Personal history of nicotine dependence: Secondary | ICD-10-CM | POA: Diagnosis not present

## 2019-04-16 DIAGNOSIS — R42 Dizziness and giddiness: Secondary | ICD-10-CM | POA: Diagnosis not present

## 2019-04-16 DIAGNOSIS — K2100 Gastro-esophageal reflux disease with esophagitis, without bleeding: Secondary | ICD-10-CM | POA: Diagnosis not present

## 2019-04-16 DIAGNOSIS — I5022 Chronic systolic (congestive) heart failure: Secondary | ICD-10-CM | POA: Diagnosis present

## 2019-04-16 DIAGNOSIS — I480 Paroxysmal atrial fibrillation: Secondary | ICD-10-CM | POA: Diagnosis present

## 2019-04-16 DIAGNOSIS — I428 Other cardiomyopathies: Secondary | ICD-10-CM | POA: Diagnosis present

## 2019-04-16 DIAGNOSIS — E43 Unspecified severe protein-calorie malnutrition: Secondary | ICD-10-CM | POA: Diagnosis present

## 2019-04-16 DIAGNOSIS — E785 Hyperlipidemia, unspecified: Secondary | ICD-10-CM | POA: Diagnosis present

## 2019-04-16 DIAGNOSIS — D509 Iron deficiency anemia, unspecified: Secondary | ICD-10-CM | POA: Diagnosis present

## 2019-04-16 DIAGNOSIS — K829 Disease of gallbladder, unspecified: Secondary | ICD-10-CM | POA: Diagnosis present

## 2019-04-16 DIAGNOSIS — Z91018 Allergy to other foods: Secondary | ICD-10-CM | POA: Diagnosis not present

## 2019-04-16 LAB — URINALYSIS, ROUTINE W REFLEX MICROSCOPIC
Bilirubin Urine: NEGATIVE
Glucose, UA: NEGATIVE mg/dL
Ketones, ur: NEGATIVE mg/dL
Leukocytes,Ua: NEGATIVE
Nitrite: NEGATIVE
Protein, ur: NEGATIVE mg/dL
RBC / HPF: >50 RBC/hpf — ABNORMAL HIGH (ref 0–5)
Specific Gravity, Urine: 1.009 (ref 1.005–1.030)
pH: 6 (ref 5.0–8.0)

## 2019-04-16 LAB — CBC
HCT: 33.7 % — ABNORMAL LOW (ref 36.0–46.0)
Hemoglobin: 11.5 g/dL — ABNORMAL LOW (ref 12.0–15.0)
MCH: 27.9 pg (ref 26.0–34.0)
MCHC: 34.1 g/dL (ref 30.0–36.0)
MCV: 81.8 fL (ref 80.0–100.0)
Platelets: 276 10*3/uL (ref 150–400)
RBC: 4.12 MIL/uL (ref 3.87–5.11)
RDW: 17 % — ABNORMAL HIGH (ref 11.5–15.5)
WBC: 9.7 10*3/uL (ref 4.0–10.5)
nRBC: 0 % (ref 0.0–0.2)

## 2019-04-16 LAB — COMPREHENSIVE METABOLIC PANEL
ALT: 49 U/L — ABNORMAL HIGH (ref 0–44)
AST: 94 U/L — ABNORMAL HIGH (ref 15–41)
Albumin: 2.7 g/dL — ABNORMAL LOW (ref 3.5–5.0)
Alkaline Phosphatase: 91 U/L (ref 38–126)
Anion gap: 11 (ref 5–15)
BUN: 55 mg/dL — ABNORMAL HIGH (ref 8–23)
CO2: 21 mmol/L — ABNORMAL LOW (ref 22–32)
Calcium: 8.7 mg/dL — ABNORMAL LOW (ref 8.9–10.3)
Chloride: 101 mmol/L (ref 98–111)
Creatinine, Ser: 2.99 mg/dL — ABNORMAL HIGH (ref 0.44–1.00)
GFR calc Af Amer: 17 mL/min — ABNORMAL LOW (ref >60)
GFR calc non Af Amer: 15 mL/min — ABNORMAL LOW (ref >60)
Glucose, Bld: 80 mg/dL (ref 70–99)
Potassium: 3.5 mmol/L (ref 3.5–5.1)
Sodium: 133 mmol/L — ABNORMAL LOW (ref 135–145)
Total Bilirubin: 0.9 mg/dL (ref 0.3–1.2)
Total Protein: 6.8 g/dL (ref 6.5–8.1)

## 2019-04-16 LAB — HEPATITIS PANEL, ACUTE
HCV Ab: NONREACTIVE
Hep A IgM: NONREACTIVE
Hep B C IgM: NONREACTIVE
Hepatitis B Surface Ag: NONREACTIVE

## 2019-04-16 LAB — SARS CORONAVIRUS 2 (TAT 6-24 HRS): SARS Coronavirus 2: NEGATIVE

## 2019-04-16 LAB — MAGNESIUM: Magnesium: 2.1 mg/dL (ref 1.7–2.4)

## 2019-04-16 MED ORDER — ONDANSETRON HCL 4 MG/2ML IJ SOLN
4.0000 mg | Freq: Four times a day (QID) | INTRAMUSCULAR | Status: DC | PRN
  Administered 2019-04-16 – 2019-04-18 (×3): 4 mg via INTRAVENOUS
  Filled 2019-04-16 (×3): qty 2

## 2019-04-16 MED ORDER — POTASSIUM CHLORIDE IN NACL 20-0.9 MEQ/L-% IV SOLN
INTRAVENOUS | Status: DC
  Administered 2019-04-16: 11:00:00 via INTRAVENOUS
  Filled 2019-04-16: qty 1000

## 2019-04-16 MED ORDER — POTASSIUM CHLORIDE IN NACL 20-0.9 MEQ/L-% IV SOLN
INTRAVENOUS | Status: AC
  Filled 2019-04-16: qty 1000

## 2019-04-16 NOTE — Progress Notes (Addendum)
PROGRESS NOTE    Stephanie Yates  PJA:[id number] DOB: 1945/12/17 DOA: 04/15/2019 PCP: Josetta Huddle, MD  Brief Narrative: Stephanie Yates is a 73 year old chronically ill female with history of nonischemic cardiomyopathy, EF of 15%, history of ICD, paroxysmal atrial fibrillation on Eliquis, hypertension, dyslipidemia, chronic anemia, stage III kidney disease presented to the emergency room with dizziness yesterday -Found to have significant worsening of kidney function with creatinine up to 3.2 from baseline of 1.6-1.8, also found to be profoundly hypokalemic and hyponatremic   Assessment & Plan:   Dizziness -I suspect this is secondary to dehydration and acute kidney injury with severe hypokalemia, he has no acute neurological symptoms -CT head is unremarkable, symptoms have improved -Physical therapy evaluation  AKI on CKD stage III -Baseline creatinine around 1.6-1.8, admitted with creatinine of 3.2 -Likely secondary to GI illness, recent poor p.o. intake and concurrent diuretic use -Gentle IV fluids today, holding Lasix and metolazone  Severe nonischemic cardiomyopathy status post AICD -EF of 15% -Continue carvedilol, continue low-dose hydralazine -Hold Imdur and diuretics today  Paroxysmal atrial fibrillation -Currently in sinus rhythm, continue carvedilol and Eliquis  Suspected gastroenteritis -Recent vomiting and diarrhea, appears to be improving, did have nausea today and an episode of vomiting last night -Covid PCR is negative, supportive care, liquid diet advance as tolerated  Severe protein calorie malnutrition   DVT prophylaxis: Eliquis Code Status: Full code Family Communication: No family at bedside Disposition Plan: Home pending improvement in kidney function, oral intake  Consultants:      Procedures:   Antimicrobials:    Subjective: -Complains of nausea, denies any dizziness, denies chest pain dyspnea -Reports dizziness yesterday since waking up,  denies any headache, no unilateral weakness numbness etc. Objective: Vitals:   04/16/19 0000 04/16/19 0047 04/16/19 0500 04/16/19 1215  BP: (!) 118/58 (!) 146/63 128/68   Pulse: (!) 59 60 60 60  Resp: 15 16 16    Temp:  97.8 F (36.6 C) 98.3 F (36.8 C) 98.4 F (36.9 C)  TempSrc:  Oral Oral Oral  SpO2: 94% 96% 92% 96%  Weight:   56 kg   Height:        Intake/Output Summary (Last 24 hours) at 04/16/2019 1236 Last data filed at 04/16/2019 0400 Gross per 24 hour  Intake 300.05 ml  Output 550 ml  Net -249.95 ml   Filed Weights   04/15/19 1936 04/16/19 0500  Weight: 59 kg 56 kg    Examination:  General exam: Chronically ill cachectic female sitting up in bed AAO x2 Respiratory system: Clear today Cardiovascular system: S1 & S2 heard,  Gastrointestinal system: Abdomen is nondistended, soft and nontender.Normal bowel sounds heard. Central nervous system: Alert and oriented. No focal neurological deficits. Extremities: No edema Skin: No rashes, lesions or ulcers Psychiatry: Poor insight and judgment    Data Reviewed:   CBC: Recent Labs  Lab 04/15/19 2001 04/16/19 0549  WBC 10.0 9.7  NEUTROABS 6.8  --   HGB 12.5 11.5*  HCT 38.2 33.7*  MCV 82.3 81.8  PLT 336 341   Basic Metabolic Panel: Recent Labs  Lab 04/15/19 2001 04/16/19 0549  NA 129* 133*  K 2.6* 3.5  CL 93* 101  CO2 21* 21*  GLUCOSE 112* 80  BUN 56* 55*  CREATININE 3.23* 2.99*  CALCIUM 8.7* 8.7*  MG  --  2.1   GFR: Estimated Creatinine Clearance: 12.8 mL/min (A) (by C-G formula based on SCr of 2.99 mg/dL (H)). Liver Function Tests: Recent Labs  Lab 04/15/19  2001 04/16/19 0549  AST 112* 94*  ALT 60* 49*  ALKPHOS 87 91  BILITOT 1.1 0.9  PROT 7.7 6.8  ALBUMIN 3.2* 2.7*   No results for input(s): LIPASE, AMYLASE in the last 168 hours. No results for input(s): AMMONIA in the last 168 hours. Coagulation Profile: Recent Labs  Lab 04/15/19 2001  INR 2.4*   Cardiac Enzymes: Recent Labs   Lab 04/15/19 2213  CKTOTAL 42   BNP (last 3 results) No results for input(s): PROBNP in the last 8760 hours. HbA1C: No results for input(s): HGBA1C in the last 72 hours. CBG: No results for input(s): GLUCAP in the last 168 hours. Lipid Profile: No results for input(s): CHOL, HDL, LDLCALC, TRIG, CHOLHDL, LDLDIRECT in the last 72 hours. Thyroid Function Tests: No results for input(s): TSH, T4TOTAL, FREET4, T3FREE, THYROIDAB in the last 72 hours. Anemia Panel: No results for input(s): VITAMINB12, FOLATE, FERRITIN, TIBC, IRON, RETICCTPCT in the last 72 hours. Urine analysis:    Component Value Date/Time   COLORURINE YELLOW 04/16/2019 [place] 04/16/2019 0218   LABSPEC 1.009 04/16/2019 0218   PHURINE 6.0 04/16/2019 0218   GLUCOSEU NEGATIVE 04/16/2019 0218   HGBUR LARGE (A) 04/16/2019 0218   BILIRUBINUR NEGATIVE 04/16/2019 [place] 04/16/2019 0218   PROTEINUR NEGATIVE 04/16/2019 0218   UROBILINOGEN 1.0 08/29/2014 2035   NITRITE NEGATIVE 04/16/2019 0218   LEUKOCYTESUR NEGATIVE 04/16/2019 0218   Sepsis Labs: @LABRCNTIP (procalcitonin:4,lacticidven:4)  ) Recent Results (from the past 240 hour(s))  SARS CORONAVIRUS 2 (TAT 6-24 HRS) Nasopharyngeal Nasopharyngeal Swab     Status: None   Collection Time: 04/15/19  9:52 PM   Specimen: Nasopharyngeal Swab  Result Value Ref Range Status   SARS Coronavirus 2 NEGATIVE NEGATIVE Final    Comment: (NOTE) SARS-CoV-2 target nucleic acids are NOT DETECTED. The SARS-CoV-2 RNA is generally detectable in upper and lower respiratory specimens during the acute phase of infection. Negative results do not preclude SARS-CoV-2 infection, do not rule out co-infections with other pathogens, and should not be used as the sole basis for treatment or other patient management decisions. Negative results must be combined with clinical observations, patient history, and epidemiological information. The expected result is  Negative. Fact Sheet for Patients: SugarRoll.be Fact Sheet for Healthcare Providers: https://www.woods-mathews.com/ This test is not yet approved or cleared by the [place] FDA and  has been authorized for detection and/or diagnosis of SARS-CoV-2 by FDA under an Emergency Use Authorization (EUA). This EUA will remain  in effect (meaning this test can be used) for the duration of the COVID-19 declaration under Section 56 4(b)(1) of the Act, 21 U.S.C. section 360bbb-3(b)(1), unless the authorization is terminated or revoked sooner. Performed at [facility], [place] [address] [place], [place] [address]          Radiology Studies: Ct Head Wo Contrast  Result Date: 04/15/2019 CLINICAL DATA:  Altered level of consciousness EXAM: CT HEAD WITHOUT CONTRAST TECHNIQUE: Contiguous axial images were obtained from the base of the skull through the vertex without intravenous contrast. COMPARISON:  None. FINDINGS: Brain: No evidence of acute territorial infarction, hemorrhage, hydrocephalus,extra-axial collection or mass lesion/mass effect. There is mild dilatation the ventricles and sulci consistent with age-related atrophy. Low-attenuation changes in the deep white matter consistent with small vessel ischemia. Vascular: No hyperdense vessel or unexpected calcification. Skull: The skull is intact. No fracture or focal lesion identified. Sinuses/Orbits: There is near complete opacification of the frontal sinuses, sphenoid air cells, and  bilateral maxillary sinuses. There is also ethmoid air cell mucosal thickening. Other: None IMPRESSION: 1. Findings suggestive sinusitis involving the frontal, sphenoid, bilateral maxillary sinuses. 2. No acute intracranial pathology. 3. Findings consistent with mild age related atrophy and chronic small vessel ischemia Electronically Signed   By: Prudencio Pair M.D.   On: 04/15/2019 21:44   Dg Chest Portable 1  View  Result Date: 04/15/2019 CLINICAL DATA:  Chest pain, shortness of breath. EXAM: PORTABLE CHEST 1 VIEW COMPARISON:  January 07, 2019. FINDINGS: Stable cardiomegaly. Atherosclerosis of thoracic aorta is noted. No pneumothorax or pleural effusion is noted. Right lung is clear. Minimal left basilar subsegmental atelectasis is noted. Left-sided pacemaker is in grossly good position. Bony thorax is unremarkable. IMPRESSION: Minimal left basilar subsegmental atelectasis. Aortic Atherosclerosis (ICD10-I70.0). Electronically Signed   By: Marijo Conception M.D.   On: 04/15/2019 20:26        Scheduled Meds: . amiodarone  200 mg Oral Daily  . apixaban  2.5 mg Oral BID  . atorvastatin  40 mg Oral q1800  . carvedilol  3.125 mg Oral BID WC  . cholecalciferol  1,000 Units Oral QHS  . docusate sodium  100 mg Oral Daily  . feeding supplement (ENSURE ENLIVE)  237 mL Oral Daily  . hydrALAZINE  12.5 mg Oral TID  . mexiletine  200 mg Oral BID  . pantoprazole  40 mg Oral BID AC  . polyethylene glycol  17 g Oral Daily  . sodium chloride flush  3 mL Intravenous Q12H  . sucralfate  1 g Oral TID WC & HS   Continuous Infusions: . sodium chloride    . 0.9 % NaCl with KCl 20 mEq / L 75 mL/hr at 04/16/19 1109     LOS: 0 days    Time spent: 10min    Domenic Polite, MD [facility]   04/16/2019, 12:36 PM

## 2019-04-16 NOTE — Plan of Care (Signed)

## 2019-04-16 NOTE — Progress Notes (Signed)
Pt states she is feeling nauseated. Notified md. Awaiting orders.

## 2019-04-16 NOTE — Care Management Obs Status (Signed)
[place] NOTIFICATION   Patient Details  Name: Stephanie Yates MRN: [id number] Date of Birth: [birth year]   Medicare Observation Status Notification Given:  Yes    Marilu Favre, RN 04/16/2019, 3:08 PM

## 2019-04-16 NOTE — Evaluation (Signed)
Physical Therapy Evaluation/Vestibular Assessment Patient Details Name: Stephanie Yates MRN: [id number] DOB: [birth year] Today's Date: 04/16/2019   History of Present Illness  73 y.o. female admitted on 04/15/19 for dizziness (suspected dehydration, acute kidney injury wiht severe hypokalemia).  CT is unremarkable, gastroenteritis, severe protein/calorie malnutrition.  Pt with significant PMH of v-tach, sinus brady, PAF, nonischemic cardiomyopathy, mitral regugitation, HTN, CKD stage IV, systolic CHF, chronic anemia, ICD.    Clinical Impression  Pt's vestibular testing was fairly benign except for the fact that her gaze stability is poor (ability to stabilize her eyes when she moves her head).  Her canals tested negative for BPPV.  I gave her gaze stability exercises to do every hour.  My concern, however, is that she doesn't have any help at home (she lives alone with a daughter nearby who cannot take off work to be with her).  I discussed SNF level rehab at discharge and she was adamant that she would not go to rehab anywhere, but was open to home therapy if she qualified.   PT to follow acutely for deficits listed below.      Follow Up Recommendations Home health PT;Other (comment)(vestibular home therapist if available, refused SNF)    Equipment Recommendations  Rolling walker with 5" wheels    Recommendations for Other Services   NA    Precautions / Restrictions Precautions Precautions: Fall Precaution Comments: unsteady on her feet      Mobility  Bed Mobility Overal bed mobility: Modified Independent                Transfers Overall transfer level: Needs assistance Equipment used: Rolling walker (2 wheeled) Transfers: Sit to/from Stand Sit to Stand: Min assist;Min guard         General transfer comment: Min assist to stand EOB without AD, min guard assist to stand EOB a second time with RW.   Ambulation/Gait Ambulation/Gait assistance: Min assist Gait Distance  (Feet): 5 Feet(x2) Assistive device: Rolling walker (2 wheeled) Gait Pattern/deviations: Step-through pattern;Staggering left;Staggering right     General Gait Details: Pt with staggering gait pattern, min assist to walk even with AD a short distance in the room.  Pt did better with second try, but still unsteady with poor activity tolerance (dizzy).          Balance Overall balance assessment: Needs assistance Sitting-balance support: Feet supported;No upper extremity supported Sitting balance-Leahy Scale: Good     Standing balance support: Bilateral upper extremity supported Standing balance-Leahy Scale: Poor Standing balance comment: needs external support from AD and/or PT             04/16/19 1430  Vestibular Assessment  General Observation wears bifocals, eyes are not aligned  Symptom Behavior  Subjective history of current problem "I'm drunk" woke up yesterday feeling this way any time her eyes were open (reguardless of movement).  No tinnitus, no double vision (currently--pt reports she gets it "sometimes"), no fullness, no recent medication change, no recent URI, no recent antibiotic use, no recent head injury or whiplash injury, she reports to me no h/o dizziness like this in the past (chart says h/o vertigo), increased bluriness in her vision, no reports of cataracts or glaucoma, no h/o eye surgery.   Type of Dizziness  Imbalance ("drunk")  Frequency of Dizziness intermittent now, started off constant  Duration of Dizziness long  Symptom Nature Intermittent;Motion provoked  Aggravating Factors Turning head quickly  Relieving Factors Head stationary;Rest;Closing eyes;Slow movements  Progression of Symptoms Better  History of similar episodes not per pt report  Oculomotor Exam  Oculomotor Alignment Abnormal  Spontaneous Absent  Gaze-induced  Absent  Smooth Pursuits Saccades (this would be considered normal for her age)  Saccades Undershoots  Vestibulo-Ocular  Reflex  VOR 1 Head Only (x 1 viewing) symptomatic horizontal with difficulty, very symptomatic vertically with more difficulty.   Comment Skew test negative  Auditory  Comments grossly equal hearing bil  Positional Testing  Dix-Hallpike Dix-Hallpike Right;Dix-Hallpike Left  Horizontal Canal Testing Horizontal Canal Right;Horizontal Canal Left  Dix-Hallpike Right  Dix-Hallpike Right Duration 0  Dix-Hallpike Right Symptoms No nystagmus  Dix-Hallpike Left  Dix-Hallpike Left Duration 0  Dix-Hallpike Left Symptoms No nystagmus  Horizontal Canal Right  Horizontal Canal Right Duration 0  Horizontal Canal Right Symptoms Normal  Horizontal Canal Left  Horizontal Canal Left Duration 0  Horizontal Canal Left Symptoms Normal  Orthostatics  Orthostatics Comment these were checked by RN staff and were negative, see vitals flow sheet.                       Pertinent Vitals/Pain Pain Assessment: No/denies pain    Home Living Family/patient expects to be discharged to:: Private residence Living Arrangements: Alone Available Help at Discharge: Family;Available PRN/intermittently(daughter lives nearby, but works) Type of Home: House Home Access: Stairs to enter Entrance Stairs-Rails: None Technical brewer of Steps: 2 Home Layout: One level Home Equipment: Kasandra Knudsen - single point      Prior Function Level of Independence: Independent         Comments: works full time in [employer], daughter drives her to work as she no longer drives.      Hand Dominance   Dominant Hand: Right    Extremity/Trunk Assessment   Upper Extremity Assessment Upper Extremity Assessment: Overall WFL for tasks assessed    Lower Extremity Assessment Lower Extremity Assessment: Generalized weakness    Cervical / Trunk Assessment Cervical / Trunk Assessment: Kyphotic  Communication   Communication: No difficulties  Cognition Arousal/Alertness: Awake/alert Behavior During Therapy: WFL  for tasks assessed/performed Overall Cognitive Status: Within Functional Limits for tasks assessed                                           Exercises Other Exercises Other Exercises: x1 seated horizontal and vertical gaze stability exercises given and reviewed.  Handout given.  I asked pt to try both vertical and horizontal every hour.    Assessment/Plan    PT Assessment Patient needs continued PT services  PT Problem List Decreased strength;Decreased activity tolerance;Decreased balance;Decreased mobility;Decreased knowledge of use of DME;Decreased knowledge of precautions       PT Treatment Interventions DME instruction;Gait training;Stair training;Functional mobility training;Therapeutic activities;Therapeutic exercise;Balance training;Patient/family education    PT Goals (Current goals can be found in the Care Plan section)  Acute Rehab PT Goals Patient Stated Goal: to feel back to normal PT Goal Formulation: With patient Time For Goal Achievement: 04/30/19 Potential to Achieve Goals: Good    Frequency Min 3X/week   Barriers to discharge Decreased caregiver support pt has no one who could stay with her.       AM-PAC PT "6 Clicks" Mobility  Outcome Measure Help needed turning from your back to your side while in a flat bed without using bedrails?: A Little Help needed moving from lying on your back to sitting on  the side of a flat bed without using bedrails?: A Little Help needed moving to and from a bed to a chair (including a wheelchair)?: A Little Help needed standing up from a chair using your arms (e.g., wheelchair or bedside chair)?: A Little Help needed to walk in hospital room?: A Little Help needed climbing 3-5 steps with a railing? : A Little 6 Click Score: 18    End of Session   Activity Tolerance: Other (comment)(limited by dizziness (no nausea)) Patient left: in bed;with call bell/phone within reach;with bed alarm set Nurse  Communication: Mobility status PT Visit Diagnosis: Muscle weakness (generalized) (M62.81);Difficulty in walking, not elsewhere classified (R26.2);Dizziness and giddiness (R42)    Time: 0518-3358 PT Time Calculation (min) (ACUTE ONLY): 41 min   Charges:           Wells Guiles B. Shishir Krantz, PT, DPT  Acute Rehabilitation [phone] pager [phone] office  @ [facility]: [phone]   PT Evaluation $PT Eval Moderate Complexity: 1 Mod PT Treatments $Therapeutic Exercise: 8-22 mins $Therapeutic Activity: 8-22 mins        04/16/2019, 2:27 PM

## 2019-04-16 NOTE — TOC Initial Note (Signed)
Transition of Care [facility]) - Initial/Assessment Note    Patient Details  Name: Stephanie Yates MRN: [id number] Date of Birth: Jun 19, 1946  [facility]) CM/SW Contact:    Marilu Favre, RN Phone Number: 04/16/2019, 3:11 PM  Clinical Narrative:                 Patient from home alone, states she already has a [facility] but does not remember name of agency. Per Epic notes she went home in September with [place]. Cory with [facility] checking to see what services she has. Patient has [place] and Time with [facility].  PT recommendation HHPT due to patient refusing SNF  Discussed SNF with patient. Patient voices understanding but refuses. PT recommended HHPT , patient agrees to HHPT, PT recommended walker patient refuses walker.   Patient lives alone states daughter lives close by. Offered to call daughter , patient refused.   Expected Discharge Plan: [place] Barriers to Discharge: Continued Medical Work up   Patient Goals and CMS Choice Patient states their goals for this hospitalization and ongoing recovery are:: to go home CMS Medicare.gov Compare Post Acute Care list provided to:: Patient Choice offered to / list presented to : Patient  Expected Discharge Plan and Services Expected Discharge Plan: [place] Choice: [place] arrangements for the past 2 months: Single Family Home                 DME Arranged: N/A         HH Arranged: PT HH Agency: [facility] Date [place]: 04/16/19 Time HH Agency Contacted: [id number] Representative spoke with at [place]: Tommi Rumps  Prior Living Arrangements/Services Living arrangements for the past 2 months: [place] with:: Self Patient language and need for interpreter reviewed:: Yes Do you feel safe going back to the place where you live?: Yes      Need for Family Participation in Patient Care: Yes (Comment) Care giver support system in  place?: Yes (comment) Current home services: Home PT Criminal Activity/Legal Involvement Pertinent to Current Situation/Hospitalization: No - Comment as needed  Activities of Daily Living Home Assistive Devices/Equipment: None ADL Screening (condition at time of admission) Patient's cognitive ability adequate to safely complete daily activities?: Yes Is the patient deaf or have difficulty hearing?: No Does the patient have difficulty seeing, even when wearing glasses/contacts?: No Does the patient have difficulty concentrating, remembering, or making decisions?: No Patient able to express need for assistance with ADLs?: Yes Does the patient have difficulty dressing or bathing?: No Independently performs ADLs?: Yes (appropriate for developmental age) Does the patient have difficulty walking or climbing stairs?: Yes Weakness of Legs: Both Weakness of Arms/Hands: Both  Permission Sought/Granted   Permission granted to share information with : No              Emotional Assessment Appearance:: Appears stated age Attitude/Demeanor/Rapport: Engaged Affect (typically observed): Accepting Orientation: : Oriented to Self, Oriented to Place, Oriented to  Time, Oriented to Situation Alcohol / Substance Use: Not Applicable Psych Involvement: No (comment)  Admission diagnosis:  Dehydration [E86.0] Hypokalemia [E87.6] Dizziness [R42] Patient Active Problem List   Diagnosis Date Noted  . Vertigo 04/15/2019  . Abnormal liver function 04/15/2019  . Right adrenal mass ([place]) possible adenoma 03/18/2019  . Nephrolithiasis 03/18/2019  . Prolonged QT interval 03/17/2019  . Renal failure (ARF), acute on chronic ([place]) 03/17/2019  .  Nausea and vomiting 03/17/2019  . Epigastric abdominal pain 01/29/2019  . Gastritis 01/28/2019  . CKD (chronic kidney disease) stage 4, GFR 15-29 ml/min (HCC) 01/28/2019  . VT (ventricular tachycardia) ([place]) 01/17/2019  . AKI (acute kidney injury) ([place]) 01/12/2019   . Palliative care encounter   . CHF exacerbation ([place]) 01/06/2019  . Influenza A 09/02/2018  . Lactic acidosis 09/02/2018  . Chronic combined systolic and diastolic congestive heart failure ([place]) 09/02/2018  . Rapid atrial fibrillation ([place]) 06/08/2018  . Acute on chronic combined systolic and diastolic heart failure ([place]) 06/07/2018  . Anemia 06/07/2018  . Acute GI bleeding 06/15/2017  . Chronic anticoagulation   . Acute on chronic congestive heart failure ([place])   . HLD (hyperlipidemia) 03/05/2017  . CKD (chronic kidney disease), stage III 03/05/2017  . Chest pain 03/05/2017  . Symptomatic anemia 03/05/2017  . PAF (paroxysmal atrial fibrillation) ([place]) 04/01/2013  . ICD (implantable cardioverter-defibrillator), dual, [place] 04/01/2013  . PVC's (premature ventricular contractions) 04/01/2013  . Hypokalemia 12/21/2012  . History of tobacco abuse 12/21/2012  . Transaminitis 12/21/2012  . Sinus bradycardia 12/21/2012  . Hyperkalemia 12/20/2012  . Nonischemic cardiomyopathy ([place]) 12/20/2012  . UTI (urinary tract infection) 12/20/2012  . Acute on chronic combined systolic and diastolic CHF (congestive heart failure) ([place]) 12/20/2012  . Ventricular tachycardia ([place]) 12/14/2012  . Hypertension 12/14/2012   PCP:  Josetta Huddle, MD Pharmacy:   [facility], [place] - [place] [facility] [place] [address] [place] [address] Phone: [phone] Fax: [phone]     Social Determinants of Health (SDOH) Interventions    Readmission Risk Interventions Readmission Risk Prevention Plan 01/24/2019 01/08/2019 09/05/2018  Transportation Screening Complete - Complete  PCP or Specialist Appt within 3-5 Days Complete - Complete  HRI or Home Care Consult Complete Patient refused Complete  Social Work Consult for [place] Planning/Counseling Complete - Complete  Palliative Care Screening Not Applicable Not Applicable  Not Applicable  Medication Review Press photographer) Complete - Not Complete  Med Review Comments - - pt not ready for dc/ rn to complete when pt is ready for dc  Some recent data might be hidden

## 2019-04-17 LAB — CBC
HCT: 31.9 % — ABNORMAL LOW (ref 36.0–46.0)
Hemoglobin: 10.8 g/dL — ABNORMAL LOW (ref 12.0–15.0)
MCH: 27.6 pg (ref 26.0–34.0)
MCHC: 33.9 g/dL (ref 30.0–36.0)
MCV: 81.4 fL (ref 80.0–100.0)
Platelets: 225 10*3/uL (ref 150–400)
RBC: 3.92 MIL/uL (ref 3.87–5.11)
RDW: 17.3 % — ABNORMAL HIGH (ref 11.5–15.5)
WBC: 7.3 10*3/uL (ref 4.0–10.5)
nRBC: 0 % (ref 0.0–0.2)

## 2019-04-17 LAB — BASIC METABOLIC PANEL
Anion gap: 9 (ref 5–15)
BUN: 40 mg/dL — ABNORMAL HIGH (ref 8–23)
CO2: 21 mmol/L — ABNORMAL LOW (ref 22–32)
Calcium: 8.4 mg/dL — ABNORMAL LOW (ref 8.9–10.3)
Chloride: 107 mmol/L (ref 98–111)
Creatinine, Ser: 2.16 mg/dL — ABNORMAL HIGH (ref 0.44–1.00)
GFR calc Af Amer: 25 mL/min — ABNORMAL LOW (ref >60)
GFR calc non Af Amer: 22 mL/min — ABNORMAL LOW (ref >60)
Glucose, Bld: 92 mg/dL (ref 70–99)
Potassium: 3.8 mmol/L (ref 3.5–5.1)
Sodium: 137 mmol/L (ref 135–145)

## 2019-04-17 NOTE — Progress Notes (Signed)
Physical Therapy Treatment Patient Details Name: Stephanie Yates MRN: [id number] DOB: Jul 02, 1945 Today's Date: 04/17/2019    History of Present Illness 73 y.o. female admitted on 04/15/19 for dizziness (suspected dehydration, acute kidney injury wiht severe hypokalemia).  CT is unremarkable, gastroenteritis, severe protein/calorie malnutrition.  Pt with significant PMH of v-tach, sinus brady, PAF, nonischemic cardiomyopathy, mitral regugitation, HTN, CKD stage IV, systolic CHF, chronic anemia, ICD.  04/17/19 worsening of kidney function.    PT Comments    Pt's mobility was significantly worse today (posterior lean in sitting), dropping in standing (buckling), 4 extremity weakness and lack of coordination.  We were only able to stand EOB and shuffle feet today.  I was hoping to walk around the room, but she needed more support to stand EOB.  HR 60, BP and O2 NT.  Pt also winking when trying to focus gaze and reported horizontal double vision which was better with her glasses donned and target "A" was further away (~8') vs arm's length.  She would be unable to care for herself at home without assistance in her current state.  She will need post acute rehab unless her daughter can stay with her.    Follow Up Recommendations  SNF;Supervision for mobility/OOB     Equipment Recommendations  Rolling walker with 5" wheels;3in1 (PT)    Recommendations for Other Services  NA     Precautions / Restrictions Precautions Precautions: Fall Precaution Comments: unsteady on her feet    Mobility  Bed Mobility Overal bed mobility: Needs Assistance Bed Mobility: Supine to Sit     Supine to sit: Supervision     General bed mobility comments: significant extra time needed to complete today, sat and rested on her right elbow for a bit before pushing up to sitting and heavy reliance on the railing.   Transfers Overall transfer level: Needs assistance Equipment used: Rolling walker (2  wheeled) Transfers: Sit to/from Stand Sit to Stand: Mod assist         General transfer comment: Mod assist to stand EOB over shaking legs.  She had "dropping" tremors in her legs where she would have herself and then have frequent little buckles requiring mod assist to power up and heavy min assist with support of RW to maintain standing.    Ambulation/Gait             General Gait Details: unable today, was able to shuffle feet in place with more significant difficulty with coordination, weakness, and stability.           Balance Overall balance assessment: Needs assistance Sitting-balance support: Feet supported Sitting balance-Leahy Scale: Good     Standing balance support: Bilateral upper extremity supported Standing balance-Leahy Scale: Poor Standing balance comment: needs external support from AD and/or PT up to mod assist in static standing.            04/17/19 1719  Vestibular Assessment  General Observation Left eye still does not seem to be in alignment with R, pt winking her left eye today reporting (vaguely) some horizontal double vision.  tracking is a mess, even being able to hold her eyes open and focus gaze was difficult today (really not able to do x1 head shaking).   Symptom Behavior  Subjective history of current problem Pt reports last night the the room was moving to the left (constant).  The only relieft she got was closing her eyes.    Type of Dizziness  "Horizon moves"  Frequency of Dizziness  near constant  Duration of Dizziness long  Symptom Nature Constant  Aggravating Factors Activity in general  Relieving Factors Head stationary;Lying supine;Closing eyes  Progression of Symptoms Worse  Oculomotor Exam  Oculomotor Alignment Abnormal  Spontaneous Absent  Gaze-induced  Absent (however, she had significant difficulty with tracking)  Smooth Pursuits Saccades (see above comment)  Vestibulo-Ocular Reflex  VOR 1 Head Only (x 1 viewing)  unable to preform today   04/17/2019 pt with droopy eyelids, so difficult to assess without holding lids up and then she starts rapidly blinking.                     Cognition Arousal/Alertness: Awake/alert Behavior During Therapy: WFL for tasks assessed/performed Overall Cognitive Status: (not specifically tested)                                        Exercises Other Exercises Other Exercises: attempted x 1 seated exercises, pt started squinting one eye, and reporting vaguely double vision (horizontal).  she had significant difficulty focusing her gaze to even track my finger today.      General Comments General comments (skin integrity, edema, etc.): Pt was much worse today than yesterday, tested bil strength and coordination with generalized deficits in both in all 4 extremities.  No unilateral asymmetry.  She reported having difficulty feeding herself today due to tremulous arms and hands.        Pertinent Vitals/Pain Pain Assessment: Faces Faces Pain Scale: Hurts little more Pain Location: just below her sternum, reports it feels like "something is stuck" Pain Descriptors / Indicators: Grimacing;Guarding Pain Intervention(s): Limited activity within patient's tolerance;Monitored during session;Repositioned           PT Goals (current goals can now be found in the care plan section) Acute Rehab PT Goals Patient Stated Goal: to feel back to normal Progress towards PT goals: Not progressing toward goals - comment(worse today)    Frequency    Min 3X/week      PT Plan Discharge plan needs to be updated    AM-PAC PT "6 Clicks" Mobility   Outcome Measure  Help needed turning from your back to your side while in a flat bed without using bedrails?: A Little Help needed moving from lying on your back to sitting on the side of a flat bed without using bedrails?: A Little Help needed moving to and from a bed to a chair (including a wheelchair)?: A  Lot Help needed standing up from a chair using your arms (e.g., wheelchair or bedside chair)?: A Lot Help needed to walk in hospital room?: A Lot Help needed climbing 3-5 steps with a railing? : Total 6 Click Score: 13    End of Session   Activity Tolerance: Patient limited by fatigue Patient left: in bed;with call bell/phone within reach;with bed alarm set Nurse Communication: Mobility status PT Visit Diagnosis: Muscle weakness (generalized) (M62.81);Difficulty in walking, not elsewhere classified (R26.2);Dizziness and giddiness (R42)     Time: 7628-3151 PT Time Calculation (min) (ACUTE ONLY): 28 min  Charges:  $Therapeutic Activity: 23-37 mins                    Merrin Mcvicker B. Laine Giovanetti, PT, DPT  Acute Rehabilitation [phone] pager [phone] office  @ [facility]: [phone]   04/17/2019, 5:22 PM

## 2019-04-17 NOTE — Discharge Instructions (Addendum)

## 2019-04-17 NOTE — Progress Notes (Signed)
PROGRESS NOTE    Stephanie Yates  NLZ:[id number] DOB: 1946-03-14 DOA: 04/15/2019 PCP: Josetta Huddle, MD  Brief Narrative:  Ms. Stephanie Yates is a 73 year old chronically ill female with history of nonischemic cardiomyopathy, EF of 15%, history of ICD, paroxysmal atrial fibrillation on Eliquis, hypertension, dyslipidemia, chronic anemia, stage III kidney disease presented to the emergency room with dizziness yesterday -Found to have significant worsening of kidney function with creatinine up to 3.2 from baseline of 1.6-1.8, also found to be profoundly hypokalemic and hyponatremic   Assessment & Plan:   Dizziness -2/2 dehydration and acute kidney injury with severe hypokalemia-no acute neurological symptoms -CT head is unremarkable, symptoms have improved -Physical therapy evaluation recs HH PT/5" wheeled walker and Vestib rehab as she refuses SNF  AKI on CKD stage III -Baseline creatinine around 1.6-1.8, admitted with creatinine of 3.2 -Likely secondary to GI illness - holding Lasix and metolazone-resume only lasix on d/c  Severe nonischemic cardiomyopathy status post AICD -EF of 15% -Continue carvedilol, continue low-dose hydralazine -Hold Imdur and diuretics today  Paroxysmal atrial fibrillation -Currently in sinus rhythm, continue carvedilol and Eliquis  Suspected gastroenteritis -Recent vomiting and diarrhea, appears to be improving, did have nausea and vomit on 10/20 -Covid PCR is negative, supportive care, liquid diet advance as tolerated  Severe protein calorie malnutrition   DVT prophylaxis: Eliquis Code Status: Full code Family Communication: No family at bedside Disposition Plan: Home pending improvement in kidney function, oral intake  Consultants:      Procedures:   Antimicrobials:    Subjective: -some loose stool but on miralax and Colace No cp no fever no chills No vomit today thinks can eat Objective: Vitals:   04/16/19 1215 04/16/19 2027 04/17/19  0405 04/17/19 1245  BP:  (!) 117/48 (!) 115/59 (!) 118/50  Pulse: 60 (!) 59 60 60  Resp:   18 18  Temp: 98.4 F (36.9 C) 98.2 F (36.8 C) 98.2 F (36.8 C) 97.8 F (36.6 C)  TempSrc: Oral Oral Oral Oral  SpO2: 96% 98% 95% 97%  Weight:   56.2 kg   Height:        Intake/Output Summary (Last 24 hours) at 04/17/2019 1521 Last data filed at 04/16/2019 2300 Gross per 24 hour  Intake 932.5 ml  Output 1000 ml  Net -67.5 ml   Filed Weights   04/15/19 1936 04/16/19 0500 04/17/19 0405  Weight: 59 kg 56 kg 56.2 kg    Examination:  General exam: Chronically ill cachectic female sitting up in bed AAO x2 Respiratory system: Clear today no added sound Cardiovascular system: S1 & S2 heard,  Gastrointestinal system: Abdomen slightly distended no rebound no gaurd Central nervous system: Alert and oriented. No focal neurological deficits. Extremities: No edema Skin: No rashes, lesions or ulcers Psychiatry: Poor insight and judgment    Data Reviewed:   CBC: Recent Labs  Lab 04/15/19 2001 04/16/19 0549 04/17/19 0434  WBC 10.0 9.7 7.3  NEUTROABS 6.8  --   --   HGB 12.5 11.5* 10.8*  HCT 38.2 33.7* 31.9*  MCV 82.3 81.8 81.4  PLT 336 276 902   Basic Metabolic Panel: Recent Labs  Lab 04/15/19 2001 04/16/19 0549 04/17/19 0434  NA 129* 133* 137  K 2.6* 3.5 3.8  CL 93* 101 107  CO2 21* 21* 21*  GLUCOSE 112* 80 92  BUN 56* 55* 40*  CREATININE 3.23* 2.99* 2.16*  CALCIUM 8.7* 8.7* 8.4*  MG  --  2.1  --    GFR: Estimated Creatinine  Clearance: 17.7 mL/min (A) (by C-G formula based on SCr of 2.16 mg/dL (H)). Liver Function Tests: Recent Labs  Lab 04/15/19 2001 04/16/19 0549  AST 112* 94*  ALT 60* 49*  ALKPHOS 87 91  BILITOT 1.1 0.9  PROT 7.7 6.8  ALBUMIN 3.2* 2.7*   No results for input(s): LIPASE, AMYLASE in the last 168 hours. No results for input(s): AMMONIA in the last 168 hours. Coagulation Profile: Recent Labs  Lab 04/15/19 2001  INR 2.4*   Cardiac  Enzymes: Recent Labs  Lab 04/15/19 2213  CKTOTAL 42   BNP (last 3 results) No results for input(s): PROBNP in the last 8760 hours. HbA1C: No results for input(s): HGBA1C in the last 72 hours. CBG: No results for input(s): GLUCAP in the last 168 hours. Lipid Profile: No results for input(s): CHOL, HDL, LDLCALC, TRIG, CHOLHDL, LDLDIRECT in the last 72 hours. Thyroid Function Tests: No results for input(s): TSH, T4TOTAL, FREET4, T3FREE, THYROIDAB in the last 72 hours. Anemia Panel: No results for input(s): VITAMINB12, FOLATE, FERRITIN, TIBC, IRON, RETICCTPCT in the last 72 hours. Urine analysis:    Component Value Date/Time   COLORURINE YELLOW 04/16/2019 [place] 04/16/2019 0218   LABSPEC 1.009 04/16/2019 0218   PHURINE 6.0 04/16/2019 0218   GLUCOSEU NEGATIVE 04/16/2019 0218   HGBUR LARGE (A) 04/16/2019 0218   BILIRUBINUR NEGATIVE 04/16/2019 [place] 04/16/2019 0218   PROTEINUR NEGATIVE 04/16/2019 0218   UROBILINOGEN 1.0 08/29/2014 2035   NITRITE NEGATIVE 04/16/2019 0218   LEUKOCYTESUR NEGATIVE 04/16/2019 0218   Sepsis Labs: @LABRCNTIP (procalcitonin:4,lacticidven:4)  ) Recent Results (from the past 240 hour(s))  SARS CORONAVIRUS 2 (TAT 6-24 HRS) Nasopharyngeal Nasopharyngeal Swab     Status: None   Collection Time: 04/15/19  9:52 PM   Specimen: Nasopharyngeal Swab  Result Value Ref Range Status   SARS Coronavirus 2 NEGATIVE NEGATIVE Final    Comment: (NOTE) SARS-CoV-2 target nucleic acids are NOT DETECTED. The SARS-CoV-2 RNA is generally detectable in upper and lower respiratory specimens during the acute phase of infection. Negative results do not preclude SARS-CoV-2 infection, do not rule out co-infections with other pathogens, and should not be used as the sole basis for treatment or other patient management decisions. Negative results must be combined with clinical observations, patient history, and epidemiological information.  The expected result is Negative. Fact Sheet for Patients: SugarRoll.be Fact Sheet for Healthcare Providers: https://www.woods-mathews.com/ This test is not yet approved or cleared by the [place] FDA and  has been authorized for detection and/or diagnosis of SARS-CoV-2 by FDA under an Emergency Use Authorization (EUA). This EUA will remain  in effect (meaning this test can be used) for the duration of the COVID-19 declaration under Section 56 4(b)(1) of the Act, 21 U.S.C. section 360bbb-3(b)(1), unless the authorization is terminated or revoked sooner. Performed at [facility], [place] [address] [place], [place] [address]          Radiology Studies: Ct Head Wo Contrast  Result Date: 04/15/2019 CLINICAL DATA:  Altered level of consciousness EXAM: CT HEAD WITHOUT CONTRAST TECHNIQUE: Contiguous axial images were obtained from the base of the skull through the vertex without intravenous contrast. COMPARISON:  None. FINDINGS: Brain: No evidence of acute territorial infarction, hemorrhage, hydrocephalus,extra-axial collection or mass lesion/mass effect. There is mild dilatation the ventricles and sulci consistent with age-related atrophy. Low-attenuation changes in the deep white matter consistent with small vessel ischemia. Vascular: No hyperdense vessel or unexpected calcification. Skull: The skull  is intact. No fracture or focal lesion identified. Sinuses/Orbits: There is near complete opacification of the frontal sinuses, sphenoid air cells, and bilateral maxillary sinuses. There is also ethmoid air cell mucosal thickening. Other: None IMPRESSION: 1. Findings suggestive sinusitis involving the frontal, sphenoid, bilateral maxillary sinuses. 2. No acute intracranial pathology. 3. Findings consistent with mild age related atrophy and chronic small vessel ischemia Electronically Signed   By: Prudencio Pair M.D.   On: 04/15/2019 21:44   Dg Chest  Portable 1 View  Result Date: 04/15/2019 CLINICAL DATA:  Chest pain, shortness of breath. EXAM: PORTABLE CHEST 1 VIEW COMPARISON:  January 07, 2019. FINDINGS: Stable cardiomegaly. Atherosclerosis of thoracic aorta is noted. No pneumothorax or pleural effusion is noted. Right lung is clear. Minimal left basilar subsegmental atelectasis is noted. Left-sided pacemaker is in grossly good position. Bony thorax is unremarkable. IMPRESSION: Minimal left basilar subsegmental atelectasis. Aortic Atherosclerosis (ICD10-I70.0). Electronically Signed   By: Marijo Conception M.D.   On: 04/15/2019 20:26        Scheduled Meds: . amiodarone  200 mg Oral Daily  . apixaban  2.5 mg Oral BID  . atorvastatin  40 mg Oral q1800  . carvedilol  3.125 mg Oral BID WC  . feeding supplement (ENSURE ENLIVE)  237 mL Oral Daily  . hydrALAZINE  12.5 mg Oral TID  . mexiletine  200 mg Oral BID  . pantoprazole  40 mg Oral BID AC  . sodium chloride flush  3 mL Intravenous Q12H   Continuous Infusions: . sodium chloride       LOS: 1 day    Time spent: 54min    Domenic Polite, MD [facility]   04/17/2019, 3:21 PM

## 2019-04-18 LAB — RENAL FUNCTION PANEL
Albumin: 2.4 g/dL — ABNORMAL LOW (ref 3.5–5.0)
Anion gap: 6 (ref 5–15)
BUN: 30 mg/dL — ABNORMAL HIGH (ref 8–23)
CO2: 25 mmol/L (ref 22–32)
Calcium: 8.5 mg/dL — ABNORMAL LOW (ref 8.9–10.3)
Chloride: 108 mmol/L (ref 98–111)
Creatinine, Ser: 1.84 mg/dL — ABNORMAL HIGH (ref 0.44–1.00)
GFR calc Af Amer: 31 mL/min — ABNORMAL LOW (ref >60)
GFR calc non Af Amer: 26 mL/min — ABNORMAL LOW (ref >60)
Glucose, Bld: 84 mg/dL (ref 70–99)
Phosphorus: 2 mg/dL — ABNORMAL LOW (ref 2.5–4.6)
Potassium: 4 mmol/L (ref 3.5–5.1)
Sodium: 139 mmol/L (ref 135–145)

## 2019-04-18 LAB — CBC WITH DIFFERENTIAL/PLATELET
Abs Immature Granulocytes: 0.03 10*3/uL (ref 0.00–0.07)
Basophils Absolute: 0.1 10*3/uL (ref 0.0–0.1)
Basophils Relative: 1 %
Eosinophils Absolute: 0.1 10*3/uL (ref 0.0–0.5)
Eosinophils Relative: 2 %
HCT: 33.6 % — ABNORMAL LOW (ref 36.0–46.0)
Hemoglobin: 10.9 g/dL — ABNORMAL LOW (ref 12.0–15.0)
Immature Granulocytes: 0 %
Lymphocytes Relative: 20 %
Lymphs Abs: 1.7 10*3/uL (ref 0.7–4.0)
MCH: 27 pg (ref 26.0–34.0)
MCHC: 32.4 g/dL (ref 30.0–36.0)
MCV: 83.2 fL (ref 80.0–100.0)
Monocytes Absolute: 1.2 10*3/uL — ABNORMAL HIGH (ref 0.1–1.0)
Monocytes Relative: 14 %
Neutro Abs: 5.5 10*3/uL (ref 1.7–7.7)
Neutrophils Relative %: 63 %
Platelets: 242 10*3/uL (ref 150–400)
RBC: 4.04 MIL/uL (ref 3.87–5.11)
RDW: 17.3 % — ABNORMAL HIGH (ref 11.5–15.5)
WBC: 8.7 10*3/uL (ref 4.0–10.5)
nRBC: 0 % (ref 0.0–0.2)

## 2019-04-18 MED ORDER — ZOLPIDEM TARTRATE 5 MG PO TABS
5.0000 mg | ORAL_TABLET | Freq: Every day | ORAL | Status: DC
  Administered 2019-04-18: 5 mg via ORAL
  Filled 2019-04-18: qty 1

## 2019-04-18 NOTE — Progress Notes (Signed)
Physical Therapy Treatment Patient Details Name: Stephanie Yates MRN: [id number] DOB: 03/13/1946 Today's Date: 04/18/2019    History of Present Illness 73 y.o. female admitted on 04/15/19 for dizziness (suspected dehydration, acute kidney injury with severe hypokalemia).  CT is unremarkable, gastroenteritis, severe protein/calorie malnutrition.  Pt with significant PMH of v-tach, sinus brady, PAF, nonischemic cardiomyopathy, mitral regugitation, HTN, CKD stage IV, systolic CHF, chronic anemia, ICD.  04/17/19 worsening of kidney function.    PT Comments    Pt is back to where she was when I evaluated her two days ago (better than yesterday).  She was able to get up and move a small amount around the room, limited by reports of nausea.  She has not had a BM and tried to during our session with gas, but no BM.  She continues to have significant difficulty focusing her gaze and is weak and unsteady on her feet requiring external assist to get up as well as a RW for balance.  I do not know how she would do at home without RW (which she declined when RN CM asked) and assist to get out of bed, but when initially asked by RN CM, she declined SNF rehab placement.  PT will continue to follow acutely for safe mobility progression.   Follow Up Recommendations  SNF;Supervision for mobility/OOB(pt will likely refuse SNF)     Equipment Recommendations  Rolling walker with 5" wheels;3in1 (PT)    Recommendations for Other Services   NA     Precautions / Restrictions Precautions Precautions: Fall Precaution Comments: knees buckle    Mobility  Bed Mobility Overal bed mobility: Needs Assistance Bed Mobility: Supine to Sit     Supine to sit: Supervision     General bed mobility comments: supervision for safety, increased time needed and dependence on railing for support.   Transfers Overall transfer level: Needs assistance Equipment used: Rolling walker (2 wheeled) Transfers: Sit to/from  [facility] Sit to Stand: Min assist Stand pivot transfers: Min assist       General transfer comment: Min assist with RW today.  Pt better on her feet than yesterday with less instability at her knees.  She is reporting nausea with mobility (reports she has not had a BM in days).    Ambulation/Gait Ambulation/Gait assistance: Min assist Gait Distance (Feet): 5 Feet Assistive device: Rolling walker (2 wheeled) Gait Pattern/deviations: Step-to pattern;Shuffle;Staggering right;Staggering left Gait velocity: decreased   General Gait Details: Pt able to walk a short distance in room to [facility], then to chair with RW and therapist's assist over shakey knees with uncontrolled "plop" descent to sit.            Balance Overall balance assessment: Needs assistance Sitting-balance support: Feet supported;Bilateral upper extremity supported Sitting balance-Leahy Scale: Fair Sitting balance - Comments: close supervision EOB for static sitting, still posterior preference.  Postural control: Posterior lean Standing balance support: Bilateral upper extremity supported Standing balance-Leahy Scale: Poor Standing balance comment: needs external support from RW and therapist.                             Cognition Arousal/Alertness: Awake/alert Behavior During Therapy: WFL for tasks assessed/performed Overall Cognitive Status: Within Functional Limits for tasks assessed                                 General Comments: not  specifically tested      Exercises General Exercises - Lower Extremity Long Arc Quad: AROM;Both;10 reps Hip Flexion/Marching: AROM;Both;10 reps Toe Raises: AROM;Both;10 reps Heel Raises: AROM;Both;10 reps Other Exercises Other Exercises: again attempted x 1 seated gaze stability exercises with glasses donned today, but pt continues to have significant difficulty keeping her eyes on the target while moving her head.  When asked  about double vision today she denied any double vision.  She also had difficulty keeping her eyes open in general.          Pertinent Vitals/Pain Pain Assessment: Faces Faces Pain Scale: Hurts little more Pain Location: upper abdominal pain Pain Descriptors / Indicators: Grimacing;Guarding Pain Intervention(s): Monitored during session;Limited activity within patient's tolerance;Repositioned           PT Goals (current goals can now be found in the care plan section) Progress towards PT goals: Progressing toward goals    Frequency    Min 3X/week      PT Plan Current plan remains appropriate       AM-PAC PT "6 Clicks" Mobility   Outcome Measure  Help needed turning from your back to your side while in a flat bed without using bedrails?: A Little Help needed moving from lying on your back to sitting on the side of a flat bed without using bedrails?: A Little Help needed moving to and from a bed to a chair (including a wheelchair)?: A Little Help needed standing up from a chair using your arms (e.g., wheelchair or bedside chair)?: A Little Help needed to walk in hospital room?: A Little Help needed climbing 3-5 steps with a railing? : A Lot 6 Click Score: 17    End of Session   Activity Tolerance: Patient limited by fatigue Patient left: in chair;with call bell/phone within reach;with chair alarm set Nurse Communication: Mobility status PT Visit Diagnosis: Muscle weakness (generalized) (M62.81);Difficulty in walking, not elsewhere classified (R26.2);Dizziness and giddiness (R42)     Time: 4142-3953 PT Time Calculation (min) (ACUTE ONLY): 41 min  Charges:  $Therapeutic Exercise: 8-22 mins $Therapeutic Activity: 23-37 mins                    Drue Harr B. Hunt Zajicek, PT, DPT  Acute Rehabilitation [phone] pager [phone] office  @ [facility]: [phone]   04/18/2019, 3:19 PM

## 2019-04-18 NOTE — Progress Notes (Signed)
Pharmacist Heart Failure Core Measure Documentation  Assessment: Stephanie Yates has an EF documented as 15% on 01/07/19 by ECHO.  Rationale: Heart failure patients with left ventricular systolic dysfunction (LVSD) and an EF < 40% should be prescribed an angiotensin converting enzyme inhibitor (ACEI) or angiotensin receptor blocker (ARB) at discharge unless a contraindication is documented in the medical record.  This patient is not currently on an ACEI or ARB for HF.  This note is being placed in the record in order to provide documentation that a contraindication to the use of these agents is present for this encounter.  ACE Inhibitor or Angiotensin Receptor Blocker is contraindicated (specify all that apply)  []   ACEI allergy AND ARB allergy []   Angioedema []   Moderate or severe aortic stenosis []   Hyperkalemia []   Hypotension []   Renal artery stenosis [x]   Worsening renal function, preexisting renal disease or dysfunction   Lawson Radar 04/18/2019 3:12 PM

## 2019-04-18 NOTE — Progress Notes (Signed)
PROGRESS NOTE    Stephanie Yates  OEV:[id number] DOB: [birth year] DOA: 04/15/2019 PCP: Josetta Huddle, MD  Brief Narrative:  Ms. Stephanie Yates is a 73 year old chronically ill female with history of nonischemic cardiomyopathy, EF of 15%, history of ICD, paroxysmal atrial fibrillation on Eliquis, hypertension, dyslipidemia, chronic anemia, stage III kidney disease presented to the emergency room with dizziness yesterday -Found to have significant worsening of kidney function with creatinine up to 3.2 from baseline of 1.6-1.8, also found to be profoundly hypokalemic and hyponatremic and was given IVF and admitted   Assessment & Plan:   Dizziness -2/2 dehydration and acute kidney injury with severe hypokalemia-some mild improvements-able to ambulate some this am with support -CT head is unremarkable, symptoms have improved -Physical therapy evaluation recs HH PT/5" wheeled walker and [facility] rehab  As OP as declines SNF placement   AKI on CKD stage III -Baseline creatinine around 1.6-1.8, admitted with creatinine of 3.2 -Likely secondary to GI illness - holding Lasix and metolazone-resume only lasix on d/c  Severe nonischemic cardiomyopathy status post AICD -EF of 15% -Continue carvedilol, continue low-dose hydralazine -Hold Imdur and diuretics today  Paroxysmal atrial fibrillation -Currently in sinus rhythm, continue carvedilol and Eliquis  Suspected gastroenteritis -Etiology unclear-had 1 episode today--will monitor overngiht and if all stable can tee up for d/c in am -Covid PCR is negative, supportive care, liquid diet advance as tolerated  Severe protein calorie malnutrition   DVT prophylaxis: Eliquis Code Status: Full code Family Communication: No family at bedside Disposition Plan: Home pending imporvement of N and no vomit--will ask for [facility] on d/c  Consultants:      Procedures:   Antimicrobials:    Subjective:  1 episode vomit Some N Dizzyiess overall improved No  cp No fever no chills  Objective: Vitals:   04/17/19 1827 04/17/19 2133 04/17/19 2344 04/18/19 0622  BP: (!) 106/50 122/72 123/63 139/69  Pulse: 60  60 60  Resp: 16  18 18   Temp: 98.5 F (36.9 C)  98.6 F (37 C) 98.2 F (36.8 C)  TempSrc: Oral  Oral Oral  SpO2: 96%  94% 96%  Weight:    56.4 kg  Height:        Intake/Output Summary (Last 24 hours) at 04/18/2019 1241 Last data filed at 04/17/2019 1924 Gross per 24 hour  Intake -  Output 600 ml  Net -600 ml   Filed Weights   04/16/19 0500 04/17/19 0405 04/18/19 0622  Weight: 56 kg 56.2 kg 56.4 kg    Examination:  No overt changes to exam  General exam: Chronically ill cachectic female sitting up in bed AAO x2 Respiratory system: Clear today no added sound Cardiovascular system: S1 & S2 heard,  Gastrointestinal system: Abdomen slightly distended no rebound no gaurd Central nervous system: Alert and oriented. No focal neurological deficits. Extremities: No edema Skin: No rashes, lesions or ulcers Psychiatry: Poor insight and judgment    Data Reviewed:   CBC: Recent Labs  Lab 04/15/19 2001 04/16/19 0549 04/17/19 0434 04/18/19 0421  WBC 10.0 9.7 7.3 8.7  NEUTROABS 6.8  --   --  5.5  HGB 12.5 11.5* 10.8* 10.9*  HCT 38.2 33.7* 31.9* 33.6*  MCV 82.3 81.8 81.4 83.2  PLT 336 276 225 829   Basic Metabolic Panel: Recent Labs  Lab 04/15/19 2001 04/16/19 0549 04/17/19 0434 04/18/19 0421  NA 129* 133* 137 139  K 2.6* 3.5 3.8 4.0  CL 93* 101 107 108  CO2 21* 21* 21* 25  GLUCOSE 112* 80 92 84  BUN 56* 55* 40* 30*  CREATININE 3.23* 2.99* 2.16* 1.84*  CALCIUM 8.7* 8.7* 8.4* 8.5*  MG  --  2.1  --   --   PHOS  --   --   --  2.0*   GFR: Estimated Creatinine Clearance: 20.8 mL/min (A) (by C-G formula based on SCr of 1.84 mg/dL (H)). Liver Function Tests: Recent Labs  Lab 04/15/19 2001 04/16/19 0549 04/18/19 0421  AST 112* 94*  --   ALT 60* 49*  --   ALKPHOS 87 91  --   BILITOT 1.1 0.9  --   PROT 7.7  6.8  --   ALBUMIN 3.2* 2.7* 2.4*   No results for input(s): LIPASE, AMYLASE in the last 168 hours. No results for input(s): AMMONIA in the last 168 hours. Coagulation Profile: Recent Labs  Lab 04/15/19 2001  INR 2.4*   Cardiac Enzymes: Recent Labs  Lab 04/15/19 2213  CKTOTAL 42   BNP (last 3 results) No results for input(s): PROBNP in the last 8760 hours. HbA1C: No results for input(s): HGBA1C in the last 72 hours. CBG: No results for input(s): GLUCAP in the last 168 hours. Lipid Profile: No results for input(s): CHOL, HDL, LDLCALC, TRIG, CHOLHDL, LDLDIRECT in the last 72 hours. Thyroid Function Tests: No results for input(s): TSH, T4TOTAL, FREET4, T3FREE, THYROIDAB in the last 72 hours. Anemia Panel: No results for input(s): VITAMINB12, FOLATE, FERRITIN, TIBC, IRON, RETICCTPCT in the last 72 hours. Urine analysis:    Component Value Date/Time   COLORURINE YELLOW 04/16/2019 [place] 04/16/2019 0218   LABSPEC 1.009 04/16/2019 0218   PHURINE 6.0 04/16/2019 0218   GLUCOSEU NEGATIVE 04/16/2019 0218   HGBUR LARGE (A) 04/16/2019 0218   BILIRUBINUR NEGATIVE 04/16/2019 [place] 04/16/2019 0218   PROTEINUR NEGATIVE 04/16/2019 0218   UROBILINOGEN 1.0 08/29/2014 2035   NITRITE NEGATIVE 04/16/2019 0218   LEUKOCYTESUR NEGATIVE 04/16/2019 0218   Sepsis Labs: @LABRCNTIP (procalcitonin:4,lacticidven:4)  ) Recent Results (from the past 240 hour(s))  SARS CORONAVIRUS 2 (TAT 6-24 HRS) Nasopharyngeal Nasopharyngeal Swab     Status: None   Collection Time: 04/15/19  9:52 PM   Specimen: Nasopharyngeal Swab  Result Value Ref Range Status   SARS Coronavirus 2 NEGATIVE NEGATIVE Final    Comment: (NOTE) SARS-CoV-2 target nucleic acids are NOT DETECTED. The SARS-CoV-2 RNA is generally detectable in upper and lower respiratory specimens during the acute phase of infection. Negative results do not preclude SARS-CoV-2 infection, do not rule  out co-infections with other pathogens, and should not be used as the sole basis for treatment or other patient management decisions. Negative results must be combined with clinical observations, patient history, and epidemiological information. The expected result is Negative. Fact Sheet for Patients: SugarRoll.be Fact Sheet for Healthcare Providers: https://www.woods-mathews.com/ This test is not yet approved or cleared by the [place] FDA and  has been authorized for detection and/or diagnosis of SARS-CoV-2 by FDA under an Emergency Use Authorization (EUA). This EUA will remain  in effect (meaning this test can be used) for the duration of the COVID-19 declaration under Section 56 4(b)(1) of the Act, 21 U.S.C. section 360bbb-3(b)(1), unless the authorization is terminated or revoked sooner. Performed at [facility], [place] [address] [place], [place] [address]          Radiology Studies: No results found.      Scheduled Meds: . amiodarone  200 mg Oral Daily  . apixaban  2.5 mg Oral BID  . atorvastatin  40 mg Oral q1800  . carvedilol  3.125 mg Oral BID WC  . feeding supplement (ENSURE ENLIVE)  237 mL Oral Daily  . hydrALAZINE  12.5 mg Oral TID  . mexiletine  200 mg Oral BID  . pantoprazole  40 mg Oral BID AC  . sodium chloride flush  3 mL Intravenous Q12H  . zolpidem  5 mg Oral QHS   Continuous Infusions: . sodium chloride       LOS: 2 days    Time spent: 10min    Domenic Polite, MD [facility]   04/18/2019, 12:41 PM

## 2019-04-18 NOTE — Evaluation (Signed)
Occupational Therapy Evaluation Patient Details Name: Stephanie Yates MRN: [id number] DOB: 16-Aug-1945 Today's Date: 04/18/2019    History of Present Illness 73 y.o. female admitted on 04/15/19 for dizziness (suspected dehydration, acute kidney injury with severe hypokalemia).  CT is unremarkable, gastroenteritis, severe protein/calorie malnutrition.  Pt with significant PMH of v-tach, sinus brady, PAF, nonischemic cardiomyopathy, mitral regugitation, HTN, CKD stage IV, systolic CHF, chronic anemia, ICD.  04/17/19 worsening of kidney function.   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 Pt presents with generalized weakness with decreased dynamic sitting balance and unsteadiness in standing with RW, but no overt buckling. She reports mild dizziness, "nothing like it was yesterday." Pt requires min to mod assist for ADL. Recommending ST rehab in SNF prior to return home.     Follow Up Recommendations  SNF;Supervision/Assistance - 24 hour    Equipment Recommendations  3 in 1 bedside commode    Recommendations for Other Services       Precautions / Restrictions Precautions Precautions: Fall Precaution Comments: knees buckle      Mobility Bed Mobility Overal bed mobility: Needs Assistance Bed Mobility: Supine to Sit;Sit to Supine     Supine to sit: Supervision Sit to supine: Supervision   General bed mobility comments: increased time, HOB up 30 degrees  Transfers Overall transfer level: Needs assistance Equipment used: Rolling walker (2 wheeled) Transfers: Sit to/from [facility] Sit to Stand: Min assist Stand pivot transfers: Min assist       General transfer comment: knees shaking, did not completely buckle    Balance Overall balance assessment: Needs assistance   Sitting balance-Leahy Scale: Poor Sitting balance - Comments: LOB posterior when attempting to don socks    Standing balance support: Bilateral upper extremity supported Standing balance-Leahy Scale: Poor Standing balance comment: min assist and B UE support, pt stabilizing LEs on bed                           ADL either performed or assessed with clinical judgement   ADL Overall ADL's : Needs assistance/impaired Eating/Feeding: Sitting;Minimal assistance Eating/Feeding Details (indicate cue type and reason): assist to open drink Grooming: Wash/dry hands;Wash/dry face;Sitting;Supervision/safety   Upper Body Bathing: Minimal assistance;Sitting   Lower Body Bathing: Moderate assistance;Sit to/from stand;Sitting/lateral leans   Upper Body Dressing : Supervision/safety;Sitting   Lower Body Dressing: Moderate assistance;Sit to/from stand;Sitting/lateral leans Lower Body Dressing Details (indicate cue type and reason): min assist for socks, mod for panties, pt with LOB with attempt to don socks at EOB Toilet Transfer: Minimal assistance;Stand-pivot;BSC;RW   Toileting- Clothing Manipulation and Hygiene: Moderate assistance;Sit to/from stand       Functional mobility during ADLs: Minimal assistance;Rolling walker(side steps along EOB)       Vision Baseline Vision/History: Wears glasses Wears Glasses: At all times Patient Visual Report: No change from baseline       Perception     Praxis      Pertinent Vitals/Pain Pain Assessment: No/denies pain     Hand Dominance Right   Extremity/Trunk Assessment Upper Extremity Assessment Upper Extremity Assessment: Generalized weakness   Lower Extremity Assessment Lower Extremity Assessment: Defer to PT evaluation   Cervical / Trunk Assessment Cervical / Trunk Assessment: Kyphotic   Communication Communication Communication: No difficulties   Cognition Arousal/Alertness: Awake/alert Behavior During Therapy: WFL for tasks assessed/performed Overall Cognitive Status: Within Functional Limits for tasks assessed  General Comments       Exercises     Shoulder Instructions      Home Living Family/patient expects to be discharged to:: Private residence Living Arrangements: Alone Available Help at Discharge: Family;Available PRN/intermittently(daughter lives nearby but works) Type of Home: House Home Access: Stairs to enter Technical brewer of Steps: 2 Entrance Stairs-Rails: None Home Layout: One level     Bathroom Shower/Tub: Teacher, early years/pre: [place] [employer]          Prior Functioning/Environment Level of Independence: Independent        Comments: works full time in [employer], daughter drives her to work as she no longer drives.         OT Problem List: Decreased strength;Decreased activity tolerance;Impaired balance (sitting and/or standing);Decreased knowledge of use of DME or AE      OT Treatment/Interventions: Self-care/ADL training;DME and/or AE instruction;Therapeutic activities;Patient/family education;Balance training    OT Goals(Current goals can be found in the care plan section) Acute Rehab OT Goals Patient Stated Goal: to feel back to normal OT Goal Formulation: With patient Time For Goal Achievement: 05/02/19 Potential to Achieve Goals: Good ADL Goals Pt Will Perform Grooming: with min guard assist;standing Pt Will Perform Lower Body Bathing: with min guard assist;sit to/from stand Pt Will Perform Lower Body Dressing: with min guard assist;sit to/from stand Pt Will Transfer to Toilet: with min guard assist;ambulating;bedside commode Pt Will Perform Toileting - Clothing Manipulation and hygiene: with min guard assist;sit to/from stand Pt/caregiver will Perform Home Exercise Program: Increased strength;Both right and left upper extremity;With theraband;With written HEP provided;Independently(level 2)  OT Frequency: Min 2X/week   Barriers to D/C:             Co-evaluation              AM-PAC OT "6 Clicks" Daily Activity     Outcome Measure Help from another person eating meals?: A Little Help from another person taking care of personal grooming?: A Little Help from another person toileting, which includes using toliet, bedpan, or urinal?: A Lot Help from another person bathing (including washing, rinsing, drying)?: A Lot Help from another person to put on and taking off regular upper body clothing?: A Little Help from another person to put on and taking off regular lower body clothing?: A Lot 6 Click Score: 15   End of Session Equipment Utilized During Treatment: Gait belt;Rolling walker  Activity Tolerance: Patient limited by fatigue Patient left: in bed;with call bell/phone within reach;with bed alarm set  OT Visit Diagnosis: Unsteadiness on feet (R26.81);Other abnormalities of gait and mobility (R26.89);Muscle weakness (generalized) (M62.81)                Time: 7829-5621 OT Time Calculation (min): 19 min Charges:  OT General Charges $OT Visit: 1 Visit OT Evaluation $OT Eval Moderate Complexity: 1 Mod  Nestor Lewandowsky, OTR/L Acute Rehabilitation Services Pager: [phone] Office: [phone]  Malka So 04/18/2019, 11:25 AM

## 2019-04-19 LAB — CBC WITH DIFFERENTIAL/PLATELET
Abs Immature Granulocytes: 0.03 10*3/uL (ref 0.00–0.07)
Basophils Absolute: 0.1 10*3/uL (ref 0.0–0.1)
Basophils Relative: 1 %
Eosinophils Absolute: 0.2 10*3/uL (ref 0.0–0.5)
Eosinophils Relative: 2 %
HCT: 33 % — ABNORMAL LOW (ref 36.0–46.0)
Hemoglobin: 10.8 g/dL — ABNORMAL LOW (ref 12.0–15.0)
Immature Granulocytes: 0 %
Lymphocytes Relative: 21 %
Lymphs Abs: 1.6 10*3/uL (ref 0.7–4.0)
MCH: 27.1 pg (ref 26.0–34.0)
MCHC: 32.7 g/dL (ref 30.0–36.0)
MCV: 82.9 fL (ref 80.0–100.0)
Monocytes Absolute: 0.9 10*3/uL (ref 0.1–1.0)
Monocytes Relative: 12 %
Neutro Abs: 4.9 10*3/uL (ref 1.7–7.7)
Neutrophils Relative %: 64 %
Platelets: 199 10*3/uL (ref 150–400)
RBC: 3.98 MIL/uL (ref 3.87–5.11)
RDW: 17.3 % — ABNORMAL HIGH (ref 11.5–15.5)
WBC: 7.6 10*3/uL (ref 4.0–10.5)
nRBC: 0 % (ref 0.0–0.2)

## 2019-04-19 LAB — BASIC METABOLIC PANEL
Anion gap: 7 (ref 5–15)
BUN: 25 mg/dL — ABNORMAL HIGH (ref 8–23)
CO2: 22 mmol/L (ref 22–32)
Calcium: 8.6 mg/dL — ABNORMAL LOW (ref 8.9–10.3)
Chloride: 108 mmol/L (ref 98–111)
Creatinine, Ser: 1.76 mg/dL — ABNORMAL HIGH (ref 0.44–1.00)
GFR calc Af Amer: 32 mL/min — ABNORMAL LOW (ref >60)
GFR calc non Af Amer: 28 mL/min — ABNORMAL LOW (ref >60)
Glucose, Bld: 113 mg/dL — ABNORMAL HIGH (ref 70–99)
Potassium: 3.6 mmol/L (ref 3.5–5.1)
Sodium: 137 mmol/L (ref 135–145)

## 2019-04-19 MED ORDER — FUROSEMIDE 80 MG PO TABS: 40.0000 mg | ORAL_TABLET | Freq: Every day | ORAL | 0 refills | Status: DC

## 2019-04-19 NOTE — Progress Notes (Signed)
RN gave pt discharge instructions pt stated understanding, Belongings have been packed and IV has been removed. 1 prescription escribed to pt home pharmacy. Pt Grandson on the way to pick her up.

## 2019-04-19 NOTE — Care Management Important Message (Signed)
Important Message  Patient Details  Name: Stephanie Yates MRN: [id number] Date of Birth: 1946-03-30   Medicare Important Message Given:  Yes     Orbie Pyo 04/19/2019, 3:18 PM

## 2019-04-19 NOTE — Discharge Summary (Signed)
Physician Discharge Summary  ARANTXA PIERCEY BPZ:[id number] DOB: [birth year] DOA: 04/15/2019  PCP: Josetta Huddle, MD  Admit date: 04/15/2019 Discharge date: 04/19/2019  Time spent: 35 minutes  Recommendations for Outpatient Follow-up:  1. Needs outpatient gastroenterology follow-up regarding reflux as had just had endoscopy 02/2022 showing gastric polyps and gastritis-informed [facility] of the same and they will schedule follow-up 2. Dosage changes of Lasix metolazone and Imdur this admission as per below-patient is encouraged to follow-up with primary care physician to get labs in a week Chem-12 CBC and ensure that this is followed 3. Will require outpatient ultrasound of liver 3 to 6 months for probable gallbladder mass that was found in 03/17/2019  Discharge Diagnoses:  Principal Problem:   Vertigo Active Problems:   Hypertension   Hypokalemia   PAF (paroxysmal atrial fibrillation) (HCC)   Anemia   Renal failure (ARF), acute on chronic (HCC)   Abnormal liver function   Discharge Condition: Improved  Diet recommendation: Soft  Filed Weights   04/16/19 0500 04/17/19 0405 04/18/19 0622  Weight: 56 kg 56.2 kg 56.4 kg    History of present illness:  73 year old chronically ill female with history of nonischemic cardiomyopathy, EF of 15%, history of ICD, paroxysmal atrial fibrillation on Eliquis, hypertension, dyslipidemia, chronic anemia, stage III kidney disease presented to the emergency room with dizziness yesterday -Found to have significant worsening of kidney function with creatinine up to 3.2 from baseline of 1.6-1.8, also found to be profoundly hypokalemic and hyponatremic and was given IVF and admitted  Hospital Course:  Dizziness -2/2 dehydration and acute kidney injury with severe hypokalemia-some mild improvements-able to ambulate some this am with support -CT head is unremarkable, symptoms have improved -Physical therapy evaluation recs HH PT/5" wheeled walker and  [facility] rehab  As OP as declines SNF placement  AKI on CKD stage III -Baseline creatinine around 1.6-1.8, admitted with creatinine of 3.2 -Likely secondary to GI illness - holding Lasix and metolazone-resume only lasix on d/c  Severe nonischemic cardiomyopathy status post AICD -EF of 15% -Continue carvedilol, continue low-dose hydralazine -Hold Imdur and diuretics today  Paroxysmal atrial fibrillation -Currently in sinus rhythm, continue carvedilol and Eliquis in addition to amiodarone  Gallbladder mass, chronic nausea Admitted 9/19 through 9/22 with abdominal discomfort pain-General surgery saw her at that time-at that time it was recommended that the patient get ultrasound of the abdomen and she had endoscopy and colonoscopy showing gastric as well as colonic polyps-she should continue her Protonix and Carafate I spoke with gastroenterologist Dr. Oletta Lamas who will schedule an outpatient follow-up as she is managing to tolerate some food at this time and is eating to some degree She is not the best candidate for surgery given her nonischemic cardiomyopathy and this may be something she may have to self monitor   Procedures:  CT head   Consultations:  Telephone consulted gastroenterology Dr. Oletta Lamas on discharge  Discharge Exam: Vitals:   04/19/19 0532 04/19/19 0818  BP: (!) 113/51 121/60  Pulse:    Resp: 17   Temp: 98.1 F (36.7 C)   SpO2: 95%     General: Awake coherent no distress although not eating as much and still somewhat nauseous no vomiting today Cardiovascular: S1-S2 no murmur rub or gallop Respiratory: Clinically clear no added sound Mild abdominal tenderness No lower extremity edema Neurologically intact moving all 4 limbs without any deficit  Discharge Instructions   Discharge Instructions    Diet - low sodium heart healthy   Complete by: As  directed    Discharge instructions   Complete by: As directed    Dizziness-this was felt likely to be  secondary to both postural and inner ear issues in addition to some dehydration that was evident on admission Please make sure that you look at your medications carefully as we have discontinued your metolazone and cut back your Lasix dose in half-in addition your Imdur has been discontinued-I will not refill your meds beyond what has been given as your cardiologist and/or primary physician may need to determine changing your doses within the next week so you will need to see them and get labs within the next week to 10 days at least to determine the next steps You were recommended to go to a skilled facility however as you decided not to do so we will give you a rolling walker as well as a 3 and 1 to ensure that you have enough support at home and we will ask therapy to come out to the home and help you and give you exercises to work on  For your heart failure if you notice an increase in your weight of over 1 kg in 24 hours or increasing fluid in your lower extremities you may take an extra 40 mg of Lasix and then speak to your primary care physician regarding what else you should do  I have also prescribed for you because of your stomach discomfort a medication to help reduce this called Carafate which she should take and this should help you with your nausea and we will see if we can have the stomach doctors give you a call and follow-up with him in the office   Increase activity slowly   Complete by: As directed      Allergies as of 04/19/2019      Reactions   Strawberry Extract Hives, Itching, Swelling         Medication List    STOP taking these medications   isosorbide mononitrate 30 MG 24 hr tablet Commonly known as: IMDUR   metolazone 2.5 MG tablet Commonly known as: ZAROXOLYN     TAKE these medications   amiodarone 200 MG tablet Commonly known as: Pacerone Take 1 tablet (200 mg total) by mouth daily.   apixaban 2.5 MG Tabs tablet Commonly known as: ELIQUIS Take 1 tablet  (2.5 mg total) by mouth 2 (two) times daily.   atorvastatin 40 MG tablet Commonly known as: LIPITOR Take 1 tablet (40 mg total) by mouth daily at 6 PM.   carvedilol 3.125 MG tablet Commonly known as: COREG Take 1 tablet (3.125 mg total) by mouth 2 (two) times daily with a meal.   Ensure Take 237 mLs by mouth daily.   ferrous gluconate 324 MG tablet Commonly known as: FERGON Take 324 mg by mouth 2 (two) times daily.   furosemide 80 MG tablet Commonly known as: LASIX Take 0.5 tablets (40 mg total) by mouth daily. What changed: how much to take   hydrALAZINE 25 MG tablet Commonly known as: APRESOLINE Take 0.5 tablets (12.5 mg total) by mouth 3 (three) times daily.   MAGnesium-Oxide 400 (241.3 Mg) MG tablet Generic drug: magnesium oxide TAKE 1 TABLET BY MOUTH ONCE DAILY What changed:   how much to take  when to take this  reasons to take this   mexiletine 200 MG capsule Commonly known as: MEXITIL Take 1 capsule (200 mg total) by mouth 2 (two) times daily.   nitroGLYCERIN 0.4 MG SL tablet Commonly known  as: NITROSTAT Place 1 tablet (0.4 mg total) under the tongue every 5 (five) minutes as needed for chest pain.   ondansetron 4 MG disintegrating tablet Commonly known as: Zofran ODT Take 1 tablet (4 mg total) by mouth every 8 (eight) hours as needed for nausea or vomiting.   pantoprazole 40 MG tablet Commonly known as: Protonix Take 1 tablet (40 mg total) by mouth daily.   polyethylene glycol 17 g packet Commonly known as: MIRALAX / GLYCOLAX Take 17 g by mouth daily. Can take up to twice a day for constipation.   sucralfate 1 GM/10ML suspension Commonly known as: CARAFATE Take 10 mLs (1 g total) by mouth 4 (four) times daily -  with meals and at bedtime.   Vitamin D-3 25 MCG (1000 UT) Caps Take 1,000 Units by mouth at bedtime.            Durable Medical Equipment  (From admission, onward)         Start     Ordered   04/19/19 0802  DME 3-in-1  Once      04/19/19 5170   04/19/19 0802  For home use only DME Walker rolling  [facility])  Once    Question:  Patient needs a walker to treat with the following condition  Answer:  Dizzy spells   04/19/19 0819         Allergies  Allergen Reactions  . Strawberry Extract Hives, Itching and Swelling           The results of significant diagnostics from this hospitalization (including imaging, microbiology, ancillary and laboratory) are listed below for reference.    Significant Diagnostic Studies: Ct Head Wo Contrast  Result Date: 04/15/2019 CLINICAL DATA:  Altered level of consciousness EXAM: CT HEAD WITHOUT CONTRAST TECHNIQUE: Contiguous axial images were obtained from the base of the skull through the vertex without intravenous contrast. COMPARISON:  None. FINDINGS: Brain: No evidence of acute territorial infarction, hemorrhage, hydrocephalus,extra-axial collection or mass lesion/mass effect. There is mild dilatation the ventricles and sulci consistent with age-related atrophy. Low-attenuation changes in the deep white matter consistent with small vessel ischemia. Vascular: No hyperdense vessel or unexpected calcification. Skull: The skull is intact. No fracture or focal lesion identified. Sinuses/Orbits: There is near complete opacification of the frontal sinuses, sphenoid air cells, and bilateral maxillary sinuses. There is also ethmoid air cell mucosal thickening. Other: None IMPRESSION: 1. Findings suggestive sinusitis involving the frontal, sphenoid, bilateral maxillary sinuses. 2. No acute intracranial pathology. 3. Findings consistent with mild age related atrophy and chronic small vessel ischemia Electronically Signed   By: Prudencio Pair M.D.   On: 04/15/2019 21:44   Dg Chest Portable 1 View  Result Date: 04/15/2019 CLINICAL DATA:  Chest pain, shortness of breath. EXAM: PORTABLE CHEST 1 VIEW COMPARISON:  January 07, 2019. FINDINGS: Stable cardiomegaly. Atherosclerosis of thoracic aorta is  noted. No pneumothorax or pleural effusion is noted. Right lung is clear. Minimal left basilar subsegmental atelectasis is noted. Left-sided pacemaker is in grossly good position. Bony thorax is unremarkable. IMPRESSION: Minimal left basilar subsegmental atelectasis. Aortic Atherosclerosis (ICD10-I70.0). Electronically Signed   By: Marijo Conception M.D.   On: 04/15/2019 20:26    Microbiology: Recent Results (from the past 240 hour(s))  SARS CORONAVIRUS 2 (TAT 6-24 HRS) Nasopharyngeal Nasopharyngeal Swab     Status: None   Collection Time: 04/15/19  9:52 PM   Specimen: Nasopharyngeal Swab  Result Value Ref Range Status   SARS Coronavirus 2 NEGATIVE NEGATIVE Final  Comment: (NOTE) SARS-CoV-2 target nucleic acids are NOT DETECTED. The SARS-CoV-2 RNA is generally detectable in upper and lower respiratory specimens during the acute phase of infection. Negative results do not preclude SARS-CoV-2 infection, do not rule out co-infections with other pathogens, and should not be used as the sole basis for treatment or other patient management decisions. Negative results must be combined with clinical observations, patient history, and epidemiological information. The expected result is Negative. Fact Sheet for Patients: SugarRoll.be Fact Sheet for Healthcare Providers: https://www.woods-mathews.com/ This test is not yet approved or cleared by the [place] FDA and  has been authorized for detection and/or diagnosis of SARS-CoV-2 by FDA under an Emergency Use Authorization (EUA). This EUA will remain  in effect (meaning this test can be used) for the duration of the COVID-19 declaration under Section 56 4(b)(1) of the Act, 21 U.S.C. section 360bbb-3(b)(1), unless the authorization is terminated or revoked sooner. Performed at [facility], [place] [address], [place], [place] [address]      Labs: Basic Metabolic Panel: Recent Labs  Lab  04/15/19 2001 04/16/19 0549 04/17/19 0434 04/18/19 0421 04/19/19 0515  NA 129* 133* 137 139 137  K 2.6* 3.5 3.8 4.0 3.6  CL 93* 101 107 108 108  CO2 21* 21* 21* 25 22  GLUCOSE 112* 80 92 84 113*  BUN 56* 55* 40* 30* 25*  CREATININE 3.23* 2.99* 2.16* 1.84* 1.76*  CALCIUM 8.7* 8.7* 8.4* 8.5* 8.6*  MG  --  2.1  --   --   --   PHOS  --   --   --  2.0*  --    Liver Function Tests: Recent Labs  Lab 04/15/19 2001 04/16/19 0549 04/18/19 0421  AST 112* 94*  --   ALT 60* 49*  --   ALKPHOS 87 91  --   BILITOT 1.1 0.9  --   PROT 7.7 6.8  --   ALBUMIN 3.2* 2.7* 2.4*   No results for input(s): LIPASE, AMYLASE in the last 168 hours. No results for input(s): AMMONIA in the last 168 hours. CBC: Recent Labs  Lab 04/15/19 2001 04/16/19 0549 04/17/19 0434 04/18/19 0421 04/19/19 0515  WBC 10.0 9.7 7.3 8.7 7.6  NEUTROABS 6.8  --   --  5.5 4.9  HGB 12.5 11.5* 10.8* 10.9* 10.8*  HCT 38.2 33.7* 31.9* 33.6* 33.0*  MCV 82.3 81.8 81.4 83.2 82.9  PLT 336 276 225 242 199   Cardiac Enzymes: Recent Labs  Lab 04/15/19 2213  CKTOTAL 42   BNP: BNP (last 3 results) Recent Labs    01/05/19 2213 01/10/19 0422 04/15/19 2022  BNP 1,129.5* 1,735.2* 566.4*    ProBNP (last 3 results) No results for input(s): PROBNP in the last 8760 hours.  CBG: No results for input(s): GLUCAP in the last 168 hours.     Signed:  Nita Sells MD   [facility] 04/19/2019, 8:20 AM

## 2019-05-05 ENCOUNTER — Emergency Department (HOSPITAL_COMMUNITY): Payer: Medicare Other

## 2019-05-05 ENCOUNTER — Other Ambulatory Visit: Payer: Self-pay

## 2019-05-05 ENCOUNTER — Encounter (HOSPITAL_COMMUNITY): Payer: Self-pay

## 2019-05-05 ENCOUNTER — Inpatient Hospital Stay (HOSPITAL_COMMUNITY)
Admission: EM | Admit: 2019-05-05 | Discharge: 2019-05-14 | DRG: 291 | Disposition: A | Discharge status: Home Health | Payer: Medicare Other | Attending: Internal Medicine | Admitting: Internal Medicine

## 2019-05-05 DIAGNOSIS — I480 Paroxysmal atrial fibrillation: Secondary | ICD-10-CM | POA: Diagnosis present

## 2019-05-05 DIAGNOSIS — N184 Chronic kidney disease, stage 4 (severe): Secondary | ICD-10-CM | POA: Diagnosis present

## 2019-05-05 DIAGNOSIS — I472 Ventricular tachycardia: Secondary | ICD-10-CM

## 2019-05-05 DIAGNOSIS — I4721 Torsades de pointes: Secondary | ICD-10-CM

## 2019-05-05 DIAGNOSIS — K828 Other specified diseases of gallbladder: Secondary | ICD-10-CM | POA: Diagnosis present

## 2019-05-05 DIAGNOSIS — R531 Weakness: Secondary | ICD-10-CM

## 2019-05-05 DIAGNOSIS — Z515 Encounter for palliative care: Secondary | ICD-10-CM

## 2019-05-05 DIAGNOSIS — T502X5A Adverse effect of carbonic-anhydrase inhibitors, benzothiadiazides and other diuretics, initial encounter: Secondary | ICD-10-CM | POA: Diagnosis present

## 2019-05-05 DIAGNOSIS — I10 Essential (primary) hypertension: Secondary | ICD-10-CM | POA: Diagnosis present

## 2019-05-05 DIAGNOSIS — Z7901 Long term (current) use of anticoagulants: Secondary | ICD-10-CM

## 2019-05-05 DIAGNOSIS — I428 Other cardiomyopathies: Secondary | ICD-10-CM

## 2019-05-05 DIAGNOSIS — I130 Hypertensive heart and chronic kidney disease with heart failure and stage 1 through stage 4 chronic kidney disease, or unspecified chronic kidney disease: Principal | ICD-10-CM | POA: Diagnosis present

## 2019-05-05 DIAGNOSIS — E860 Dehydration: Secondary | ICD-10-CM | POA: Diagnosis present

## 2019-05-05 DIAGNOSIS — I5043 Acute on chronic combined systolic (congestive) and diastolic (congestive) heart failure: Secondary | ICD-10-CM | POA: Diagnosis not present

## 2019-05-05 DIAGNOSIS — Z87442 Personal history of urinary calculi: Secondary | ICD-10-CM

## 2019-05-05 DIAGNOSIS — E785 Hyperlipidemia, unspecified: Secondary | ICD-10-CM | POA: Diagnosis present

## 2019-05-05 DIAGNOSIS — D631 Anemia in chronic kidney disease: Secondary | ICD-10-CM | POA: Diagnosis present

## 2019-05-05 DIAGNOSIS — I4729 Other ventricular tachycardia: Secondary | ICD-10-CM

## 2019-05-05 DIAGNOSIS — Z91018 Allergy to other foods: Secondary | ICD-10-CM

## 2019-05-05 DIAGNOSIS — E8770 Fluid overload, unspecified: Secondary | ICD-10-CM | POA: Diagnosis present

## 2019-05-05 DIAGNOSIS — K829 Disease of gallbladder, unspecified: Secondary | ICD-10-CM | POA: Diagnosis present

## 2019-05-05 DIAGNOSIS — Z20828 Contact with and (suspected) exposure to other viral communicable diseases: Secondary | ICD-10-CM | POA: Diagnosis present

## 2019-05-05 DIAGNOSIS — I951 Orthostatic hypotension: Secondary | ICD-10-CM | POA: Diagnosis present

## 2019-05-05 DIAGNOSIS — I340 Nonrheumatic mitral (valve) insufficiency: Secondary | ICD-10-CM | POA: Diagnosis present

## 2019-05-05 DIAGNOSIS — Z79899 Other long term (current) drug therapy: Secondary | ICD-10-CM

## 2019-05-05 DIAGNOSIS — I420 Dilated cardiomyopathy: Secondary | ICD-10-CM | POA: Diagnosis present

## 2019-05-05 DIAGNOSIS — K5900 Constipation, unspecified: Secondary | ICD-10-CM | POA: Diagnosis present

## 2019-05-05 DIAGNOSIS — D649 Anemia, unspecified: Secondary | ICD-10-CM | POA: Diagnosis present

## 2019-05-05 DIAGNOSIS — Z95810 Presence of automatic (implantable) cardiac defibrillator: Secondary | ICD-10-CM | POA: Diagnosis present

## 2019-05-05 DIAGNOSIS — E876 Hypokalemia: Secondary | ICD-10-CM | POA: Diagnosis present

## 2019-05-05 DIAGNOSIS — Z87891 Personal history of nicotine dependence: Secondary | ICD-10-CM

## 2019-05-05 DIAGNOSIS — I447 Left bundle-branch block, unspecified: Secondary | ICD-10-CM | POA: Diagnosis present

## 2019-05-05 LAB — CBC WITH DIFFERENTIAL/PLATELET
Abs Immature Granulocytes: 0.03 10*3/uL (ref 0.00–0.07)
Basophils Absolute: 0.1 10*3/uL (ref 0.0–0.1)
Basophils Relative: 1 %
Eosinophils Absolute: 0.1 10*3/uL (ref 0.0–0.5)
Eosinophils Relative: 2 %
HCT: 29.3 % — ABNORMAL LOW (ref 36.0–46.0)
Hemoglobin: 9.6 g/dL — ABNORMAL LOW (ref 12.0–15.0)
Immature Granulocytes: 0 %
Lymphocytes Relative: 20 %
Lymphs Abs: 1.9 10*3/uL (ref 0.7–4.0)
MCH: 26.6 pg (ref 26.0–34.0)
MCHC: 32.8 g/dL (ref 30.0–36.0)
MCV: 81.2 fL (ref 80.0–100.0)
Monocytes Absolute: 1.2 10*3/uL — ABNORMAL HIGH (ref 0.1–1.0)
Monocytes Relative: 13 %
Neutro Abs: 6.2 10*3/uL (ref 1.7–7.7)
Neutrophils Relative %: 64 %
Platelets: 333 10*3/uL (ref 150–400)
RBC: 3.61 MIL/uL — ABNORMAL LOW (ref 3.87–5.11)
RDW: 16.2 % — ABNORMAL HIGH (ref 11.5–15.5)
WBC: 9.6 10*3/uL (ref 4.0–10.5)
nRBC: 0 % (ref 0.0–0.2)

## 2019-05-05 LAB — COMPREHENSIVE METABOLIC PANEL WITH GFR
ALT: 36 U/L (ref 0–44)
AST: 79 U/L — ABNORMAL HIGH (ref 15–41)
Albumin: 2.3 g/dL — ABNORMAL LOW (ref 3.5–5.0)
Alkaline Phosphatase: 99 U/L (ref 38–126)
Anion gap: 10 (ref 5–15)
BUN: 17 mg/dL (ref 8–23)
CO2: 23 mmol/L (ref 22–32)
Calcium: 8.3 mg/dL — ABNORMAL LOW (ref 8.9–10.3)
Chloride: 104 mmol/L (ref 98–111)
Creatinine, Ser: 1.6 mg/dL — ABNORMAL HIGH (ref 0.44–1.00)
GFR calc Af Amer: 36 mL/min — ABNORMAL LOW (ref >60)
GFR calc non Af Amer: 31 mL/min — ABNORMAL LOW (ref >60)
Glucose, Bld: 98 mg/dL (ref 70–99)
Potassium: 3 mmol/L — ABNORMAL LOW (ref 3.5–5.1)
Sodium: 137 mmol/L (ref 135–145)
Total Bilirubin: 0.8 mg/dL (ref 0.3–1.2)
Total Protein: 6.8 g/dL (ref 6.5–8.1)

## 2019-05-05 LAB — TROPONIN I (HIGH SENSITIVITY)
Troponin I (High Sensitivity): 24 ng/L — ABNORMAL HIGH (ref <18)
Troponin I (High Sensitivity): 26 ng/L — ABNORMAL HIGH (ref <18)

## 2019-05-05 LAB — BRAIN NATRIURETIC PEPTIDE: B Natriuretic Peptide: 1012.9 pg/mL — ABNORMAL HIGH (ref 0.0–100.0)

## 2019-05-05 MED ORDER — FUROSEMIDE 10 MG/ML IJ SOLN
60.0000 mg | Freq: Once | INTRAMUSCULAR | Status: AC
  Administered 2019-05-05: 60 mg via INTRAVENOUS
  Filled 2019-05-05: qty 6

## 2019-05-05 MED ORDER — POTASSIUM CHLORIDE CRYS ER 20 MEQ PO TBCR
40.0000 meq | EXTENDED_RELEASE_TABLET | Freq: Once | ORAL | Status: AC
  Administered 2019-05-05: 40 meq via ORAL
  Filled 2019-05-05: qty 2

## 2019-05-05 NOTE — ED Notes (Signed)
Iv attempted x 2 without successs.

## 2019-05-05 NOTE — ED Notes (Signed)
Patient transported to X-ray 

## 2019-05-05 NOTE — ED Provider Notes (Signed)
[facility] EMERGENCY DEPARTMENT Provider Note   CSN: [id number] Arrival date & time: 05/05/19  1715     History   Chief Complaint Chief Complaint  Patient presents with  . Leg Swelling    HPI Stephanie Yates is a 73 y.o. female.     HPI  Presents with concern of leg swelling beginning yesterday and shortness of breath at night Dyspnea last night worse laying down, feels dyspnea is 8/10 No chest pain now, did have some, was around 330PM, felt like almost a pain but not quite. Difficult to describe. Has some dyspnea at rest, more pronounced on exertion and when laying down Has been taking medications as prescribed, no big change in diet/fuids/salt intake Lives alone Does not check daily weights  No sig cough nor fever Past Medical History:  Diagnosis Date  . Chronic anemia   . Chronic systolic CHF (congestive heart failure) ([place])   . CKD (chronic kidney disease), stage IV ([place])   . Former tobacco use   . History of tobacco abuse   . Hyperlipidemia   . Hypertension   . Mitral regurgitation   . Nonischemic cardiomyopathy ([place])    a. EF 10-15% on 11/2012 cath with no significant CAD. b. EF 25% in 2019. b. EF 15% in 12/2018  . PAF (paroxysmal atrial fibrillation) ([place])   . Protein calorie malnutrition ([place])   . Quit consuming alcohol in remote past   . Sinus bradycardia 12/21/2012  . Ventricular tachycardia ([place]) 12/14/2012    Patient Active Problem List   Diagnosis Date Noted  . Gallbladder mass 05/06/2019  . Volume overload 05/06/2019  . Vertigo 04/15/2019  . Abnormal liver function 04/15/2019  . Right adrenal mass ([place]) possible adenoma 03/18/2019  . Nephrolithiasis 03/18/2019  . Prolonged QT interval 03/17/2019  . Renal failure (ARF), acute on chronic ([place]) 03/17/2019  . Nausea and vomiting 03/17/2019  . Epigastric abdominal pain 01/29/2019  . Gastritis 01/28/2019  . CKD (chronic kidney disease) stage 4, GFR 15-29 ml/min (HCC) 01/28/2019  .  VT (ventricular tachycardia) ([place]) 01/17/2019  . AKI (acute kidney injury) ([place]) 01/12/2019  . Palliative care encounter   . CHF exacerbation ([place]) 01/06/2019  . Influenza A 09/02/2018  . Lactic acidosis 09/02/2018  . Chronic combined systolic and diastolic congestive heart failure ([place]) 09/02/2018  . Rapid atrial fibrillation ([place]) 06/08/2018  . Acute on chronic combined systolic and diastolic heart failure ([place]) 06/07/2018  . Anemia 06/07/2018  . Acute GI bleeding 06/15/2017  . Chronic anticoagulation   . Acute on chronic congestive heart failure ([place])   . HLD (hyperlipidemia) 03/05/2017  . CKD (chronic kidney disease), stage III 03/05/2017  . Chest pain 03/05/2017  . Symptomatic anemia 03/05/2017  . PAF (paroxysmal atrial fibrillation) ([place]) 04/01/2013  . ICD (implantable cardioverter-defibrillator), dual, st Judes 04/01/2013  . PVC's (premature ventricular contractions) 04/01/2013  . Hypokalemia 12/21/2012  . History of tobacco abuse 12/21/2012  . Transaminitis 12/21/2012  . Sinus bradycardia 12/21/2012  . Hyperkalemia 12/20/2012  . Nonischemic cardiomyopathy ([place]) 12/20/2012  . UTI (urinary tract infection) 12/20/2012  . Acute on chronic combined systolic and diastolic CHF (congestive heart failure) ([place]) 12/20/2012  . Ventricular tachycardia ([place]) 12/14/2012  . Hypertension 12/14/2012    Past Surgical History:  Procedure Laterality Date  . BIOPSY  01/22/2019   Procedure: BIOPSY;  Surgeon: Wilford Corner, MD;  Location: [facility];  Service: Endoscopy;;  . COLONOSCOPY WITH PROPOFOL N/A 03/19/2019   Procedure: COLONOSCOPY WITH PROPOFOL;  Surgeon:  Otis Brace, MD;  Location: [facility];  Service: Gastroenterology;  Laterality: N/A;  . ESOPHAGOGASTRODUODENOSCOPY (EGD) WITH PROPOFOL N/A 06/18/2017   Procedure: ESOPHAGOGASTRODUODENOSCOPY (EGD) WITH PROPOFOL;  Surgeon: Ronnette Juniper, MD;  Location: [facility];  Service: Gastroenterology;  Laterality: N/A;  .  ESOPHAGOGASTRODUODENOSCOPY (EGD) WITH PROPOFOL N/A 01/22/2019   Procedure: ESOPHAGOGASTRODUODENOSCOPY (EGD) WITH PROPOFOL;  Surgeon: Wilford Corner, MD;  Location: [facility];  Service: Endoscopy;  Laterality: N/A;  . ESOPHAGOGASTRODUODENOSCOPY (EGD) WITH PROPOFOL N/A 03/19/2019   Procedure: ESOPHAGOGASTRODUODENOSCOPY (EGD) WITH PROPOFOL;  Surgeon: Otis Brace, MD;  Location: [facility] ENDOSCOPY;  Service: Gastroenterology;  Laterality: N/A;  . IMPLANTABLE CARDIOVERTER DEFIBRILLATOR IMPLANT  12/19/2012   St. Jude dual-chamber ICD, serial number [id number]  . IMPLANTABLE CARDIOVERTER DEFIBRILLATOR IMPLANT N/A 12/19/2012   Procedure: IMPLANTABLE CARDIOVERTER DEFIBRILLATOR IMPLANT;  Surgeon: Deboraha Sprang, MD;  Location[facility] CATH LAB;  Service: Cardiovascular;  Laterality: N/A;  . LEFT AND RIGHT HEART CATHETERIZATION WITH CORONARY ANGIOGRAM N/A 12/17/2012   Procedure: LEFT AND RIGHT HEART CATHETERIZATION WITH CORONARY ANGIOGRAM;  Surgeon: Sherren Mocha, MD;  Location: [facility] CATH LAB;  Service: Cardiovascular;  Laterality: N/A;  . POLYPECTOMY  03/19/2019   Procedure: POLYPECTOMY;  Surgeon: Otis Brace, MD;  Location: [facility] ENDOSCOPY;  Service: Gastroenterology;;  . SCLEROTHERAPY  01/22/2019   Procedure: Clide Deutscher;  Surgeon: Wilford Corner, MD;  Location: [facility] ENDOSCOPY;  Service: Endoscopy;;     OB History   No obstetric history on file.      Home Medications    Prior to Admission medications   Medication Sig Start Date End Date Taking? Authorizing Provider  acetaminophen (TYLENOL) 500 MG tablet Take 1,000 mg by mouth every 6 (six) hours as needed (pain).   Yes [provider]  albuterol (VENTOLIN HFA) 108 (90 Base) MCG/ACT inhaler Inhale 2 puffs into the lungs every 4 (four) hours as needed for wheezing or shortness of breath.   Yes [provider]  amiodarone (PACERONE) 200 MG tablet Take 1 tablet (200 mg total) by mouth daily. 02/15/19  Yes [name], PA-C   apixaban (ELIQUIS) 2.5 MG TABS tablet Take 1 tablet (2.5 mg total) by mouth 2 (two) times daily. 03/19/19  Yes Debbe Odea, MD  atorvastatin (LIPITOR) 40 MG tablet Take 1 tablet (40 mg total) by mouth daily at 6 PM. 07/05/18  Yes Deboraha Sprang, MD  carvedilol (COREG) 3.125 MG tablet Take 1 tablet (3.125 mg total) by mouth 2 (two) times daily with a meal. 09/12/18  Yes Deboraha Sprang, MD  Cholecalciferol (VITAMIN D-3) 25 MCG (1000 UT) CAPS Take 1,000 Units by mouth at bedtime.    Yes [provider]  Ensure (ENSURE) Take 237 mLs by mouth daily.   Yes [provider]  ferrous gluconate (FERGON) 324 MG tablet Take 324 mg by mouth 2 (two) times daily.  07/05/18  Yes [provider]  furosemide (LASIX) 80 MG tablet Take 0.5 tablets (40 mg total) by mouth daily. 04/19/19  Yes Nita Sells, MD  hydrALAZINE (APRESOLINE) 25 MG tablet Take 0.5 tablets (12.5 mg total) by mouth 3 (three) times daily. 01/12/19  Yes Guilford Shi, MD  MAGNESIUM-OXIDE 400 (241.3 Mg) MG tablet TAKE 1 TABLET BY MOUTH ONCE DAILY Patient taking differently: Take 400 mg by mouth daily as needed (antacid).  02/07/19  Yes Deboraha Sprang, MD  mexiletine (MEXITIL) 200 MG capsule Take 1 capsule (200 mg total) by mouth 2 (two) times daily. 01/24/19  Yes [name], PA-C  nitroGLYCERIN (NITROSTAT) 0.4 MG  SL tablet Place 1 tablet (0.4 mg total) under the tongue every 5 (five) minutes as needed for chest pain. 01/12/19  Yes Guilford Shi, MD  pantoprazole (PROTONIX) 40 MG tablet Take 1 tablet (40 mg total) by mouth daily. 04/05/19 07/04/19 Yes [place] Eric J, DO  polyethylene glycol (MIRALAX / GLYCOLAX) 17 g packet Take 17 g by mouth daily. Can take up to twice a day for constipation. Patient taking differently: Take 17 g by mouth daily as needed (constipation).  03/19/19  Yes Debbe Odea, MD  sucralfate (CARAFATE) 1 GM/10ML suspension Take 10 mLs (1 g total) by mouth 4 (four) times daily -  with  meals and at bedtime. 03/19/19  Yes Debbe Odea, MD  ondansetron (ZOFRAN ODT) 4 MG disintegrating tablet Take 1 tablet (4 mg total) by mouth every 8 (eight) hours as needed for nausea or vomiting. Patient not taking: Reported on 05/05/2019 03/21/19   [place] Eric J, DO    Family History Family History  Problem Relation Age of Onset  . Cancer Mother     Social History Social History   Tobacco Use  . Smoking status: Former Research scientist (life sciences)  . Smokeless tobacco: Never Used  Substance Use Topics  . Alcohol use: No  . Drug use: No     Allergies   Strawberry extract   Review of Systems Review of Systems  Constitutional: Negative for fever.  Respiratory: Positive for shortness of breath. Negative for cough.   Cardiovascular: Positive for leg swelling. Negative for chest pain.  Gastrointestinal: Negative for nausea and vomiting.  Genitourinary: Negative for difficulty urinating.  Skin: Negative for rash.  Neurological: Negative for syncope.     Physical Exam Updated Vital Signs BP 110/60 (BP Location: Left Arm)   Pulse 60   Temp 98.3 F (36.8 C) (Oral)   Resp 18   Ht 5' (1.524 m)   Wt 61.7 kg Comment: scale b  SpO2 94%   BMI 26.58 kg/m   Physical Exam Vitals signs and nursing note reviewed.  Constitutional:      General: She is not in acute distress.    Appearance: She is well-developed. She is not diaphoretic.  HENT:     Head: Normocephalic and atraumatic.  Eyes:     Conjunctiva/sclera: Conjunctivae normal.  Neck:     Musculoskeletal: Normal range of motion.     Vascular: JVD present.  Cardiovascular:     Rate and Rhythm: Normal rate and regular rhythm.     Heart sounds: Normal heart sounds. No murmur. No friction rub. No gallop.   Pulmonary:     Effort: Pulmonary effort is normal. No respiratory distress.     Breath sounds: Rales present. No wheezing.  Abdominal:     General: There is no distension.     Palpations: Abdomen is soft.     Tenderness: There is no  abdominal tenderness. There is no guarding.  Musculoskeletal:        General: No tenderness.     Right lower leg: Edema (2-3+) present.     Left lower leg: Edema (2-3+) present.  Skin:    General: Skin is warm and dry.     Findings: No erythema or rash.  Neurological:     Mental Status: She is alert and oriented to person, place, and time.      ED Treatments / Results  Labs (all labs ordered are listed, but only abnormal results are displayed) Labs Reviewed  CBC WITH DIFFERENTIAL/PLATELET - Abnormal; Notable for the  following components:      Result Value   RBC 3.61 (*)    Hemoglobin 9.6 (*)    HCT 29.3 (*)    RDW 16.2 (*)    Monocytes Absolute 1.2 (*)    All other components within normal limits  COMPREHENSIVE METABOLIC PANEL - Abnormal; Notable for the following components:   Potassium 3.0 (*)    Creatinine, Ser 1.60 (*)    Calcium 8.3 (*)    Albumin 2.3 (*)    AST 79 (*)    GFR calc non Af Amer 31 (*)    GFR calc Af Amer 36 (*)    All other components within normal limits  BRAIN NATRIURETIC PEPTIDE - Abnormal; Notable for the following components:   B Natriuretic Peptide 1,012.9 (*)    All other components within normal limits  BASIC METABOLIC PANEL - Abnormal; Notable for the following components:   Creatinine, Ser 1.49 (*)    Calcium 8.3 (*)    GFR calc non Af Amer 34 (*)    GFR calc Af Amer 39 (*)    All other components within normal limits  CBC WITH DIFFERENTIAL/PLATELET - Abnormal; Notable for the following components:   RBC 3.42 (*)    Hemoglobin 9.2 (*)    HCT 27.6 (*)    RDW 15.9 (*)    Monocytes Absolute 1.3 (*)    All other components within normal limits  TROPONIN I (HIGH SENSITIVITY) - Abnormal; Notable for the following components:   Troponin I (High Sensitivity) 24 (*)    All other components within normal limits  TROPONIN I (HIGH SENSITIVITY) - Abnormal; Notable for the following components:   Troponin I (High Sensitivity) 26 (*)    All other  components within normal limits  SARS CORONAVIRUS 2 (TAT 6-24 HRS)  MAGNESIUM    EKG EKG Interpretation  Date/Time:  Sunday May 05 2019 17:52:31 EST Ventricular Rate:  63 PR Interval:    QRS Duration: 132 QT Interval:  580 QTC Calculation: 594 R Axis:   -18 Text Interpretation: Sinus rhythm Prolonged PR interval Left bundle branch block No significant change in comparison to prior Confirmed by Gareth Morgan [phone]) on 05/05/2019 6:38:18 PM   Radiology Dg Chest 2 View  Result Date: 05/05/2019 CLINICAL DATA:  Shortness of breath, worsening lower extremity edema EXAM: CHEST - 2 VIEW COMPARISON:  Radiograph 09/04/2018 FINDINGS: Stable cardiomegaly with a calcified, tortuous aorta. Pacer/defibrillator pack overlies the left chest wall with leads at the right atrium and cardiac apex. Fine reticular changes throughout the lungs are similar to prior with some more streaky opacity in the lung bases. There is mild central venous congestion without frank edema. No pneumothorax or effusion. Degenerative changes are present in the imaged spine and shoulders. IMPRESSION: Coarse interstitial changes, similar to prior with basilar atelectasis. Central venous congestion and cardiomegaly. No convincing evidence of frank edema. Electronically Signed   By: Lovena Le M.D.   On: 05/05/2019 19:41    Procedures Procedures (including critical care time)  Medications Ordered in ED Medications  sodium chloride flush (NS) 0.9 % injection 3 mL (3 mLs Intravenous Given 05/06/19 0838)  sodium chloride flush (NS) 0.9 % injection 3 mL (has no administration in time range)  0.9 %  sodium chloride infusion (has no administration in time range)  acetaminophen (TYLENOL) tablet 650 mg (650 mg Oral Given 05/06/19 1445)  ondansetron (ZOFRAN) injection 4 mg (has no administration in time range)  cholecalciferol (VITAMIN D3) tablet 1,000  Units (1,000 Units Oral Not Given 05/06/19 0152)  atorvastatin (LIPITOR) tablet  40 mg (has no administration in time range)  ferrous gluconate (FERGON) tablet 324 mg (324 mg Oral Given 05/06/19 0830)  carvedilol (COREG) tablet 3.125 mg (3.125 mg Oral Given 05/06/19 0833)  hydrALAZINE (APRESOLINE) tablet 12.5 mg (12.5 mg Oral Given 05/06/19 0832)  nitroGLYCERIN (NITROSTAT) SL tablet 0.4 mg (has no administration in time range)  amiodarone (PACERONE) tablet 200 mg (200 mg Oral Given 05/06/19 0831)  mexiletine (MEXITIL) capsule 200 mg (200 mg Oral Given 05/06/19 1138)  magnesium oxide (MAG-OX) tablet 400 mg (has no administration in time range)  feeding supplement (ENSURE ENLIVE) (ENSURE ENLIVE) liquid 237 mL (237 mLs Oral Given 05/06/19 0847)  apixaban (ELIQUIS) tablet 2.5 mg (2.5 mg Oral Given 05/06/19 0833)  sucralfate (CARAFATE) 1 GM/10ML suspension 1 g (1 g Oral Given 05/06/19 1138)  polyethylene glycol (MIRALAX / GLYCOLAX) packet 17 g (has no administration in time range)  pantoprazole (PROTONIX) EC tablet 40 mg (40 mg Oral Given 05/06/19 0831)  albuterol (PROVENTIL) (2.5 MG/3ML) 0.083% nebulizer solution 3 mL (has no administration in time range)  furosemide (LASIX) injection 40 mg (40 mg Intravenous Given 05/06/19 0826)  furosemide (LASIX) injection 60 mg (60 mg Intravenous Given 05/05/19 2133)  potassium chloride SA (KLOR-CON) CR tablet 40 mEq (40 mEq Oral Given 05/05/19 2143)  potassium chloride SA (KLOR-CON) CR tablet 40 mEq (40 mEq Oral Given 05/06/19 1246)     Initial Impression / Assessment and Plan / ED Course  I have reviewed the triage vital signs and the nursing notes.  Pertinent labs & imaging results that were available during my care of the patient were reviewed by me and considered in my medical decision making (see chart for details).        73yo female with history of chronic systolic CHF secondary to nonischemic cardiomyopathy, CKD, htn, hlpd, paroxysmal atrial fibrillation, ICD in plce, presents with concern for shortness of breath and leg swelling.   History, exam, CXR and elevated BNP consistent with acute on chronic CHF exacerbation.  Given IV lasix in the ED and will admit for further care.   Final Clinical Impressions(s) / ED Diagnoses   Final diagnoses:  Acute on chronic combined systolic and diastolic CHF (congestive heart failure) [facility])    ED Discharge Orders    None       Gareth Morgan, MD 05/06/19 1556

## 2019-05-05 NOTE — ED Notes (Signed)
ED TO INPATIENT HANDOFF REPORT  ED Nurse Name and Phone #: William Hamburger [phone]  S Name/Age/Gender Stephanie Yates 73 y.o. female Room/Bed: 020C/020C  Code Status   Code Status: Prior  Home/SNF/Other Home Patient oriented to: self, place, time and situation Is this baseline? No   Triage Complete: Triage complete  Chief Complaint edema  Triage Note Since last PM increased edema to bilateral LE, worse than usual.  C/o SOB without CP.  States she has been taking her meds as ordered.  alert and oriented in NAD.    Allergies Allergies  Allergen Reactions  . Strawberry Extract Hives, Itching and Swelling         Level of Care/Admitting Diagnosis ED Disposition    None      B Medical/Surgery History Past Medical History:  Diagnosis Date  . Chronic anemia   . Chronic systolic CHF (congestive heart failure) ([place])   . CKD (chronic kidney disease), stage IV ([place])   . Former tobacco use   . History of tobacco abuse   . Hyperlipidemia   . Hypertension   . Mitral regurgitation   . Nonischemic cardiomyopathy ([place])    a. EF 10-15% on 11/2012 cath with no significant CAD. b. EF 25% in 2019. b. EF 15% in 12/2018  . PAF (paroxysmal atrial fibrillation) ([place])   . Protein calorie malnutrition ([place])   . Quit consuming alcohol in remote past   . Sinus bradycardia 12/21/2012  . Ventricular tachycardia ([place]) 12/14/2012   Past Surgical History:  Procedure Laterality Date  . BIOPSY  01/22/2019   Procedure: BIOPSY;  Surgeon: Wilford Corner, MD;  Location: [place];  Service: Endoscopy;;  . COLONOSCOPY WITH PROPOFOL N/A 03/19/2019   Procedure: COLONOSCOPY WITH PROPOFOL;  Surgeon: Otis Brace, MD;  Location: [place];  Service: Gastroenterology;  Laterality: N/A;  . ESOPHAGOGASTRODUODENOSCOPY (EGD) WITH PROPOFOL N/A 06/18/2017   Procedure: ESOPHAGOGASTRODUODENOSCOPY (EGD) WITH PROPOFOL;  Surgeon: Ronnette Juniper, MD;  Location: [place];  Service: Gastroenterology;   Laterality: N/A;  . ESOPHAGOGASTRODUODENOSCOPY (EGD) WITH PROPOFOL N/A 01/22/2019   Procedure: ESOPHAGOGASTRODUODENOSCOPY (EGD) WITH PROPOFOL;  Surgeon: Wilford Corner, MD;  Location: [place];  Service: Endoscopy;  Laterality: N/A;  . ESOPHAGOGASTRODUODENOSCOPY (EGD) WITH PROPOFOL N/A 03/19/2019   Procedure: ESOPHAGOGASTRODUODENOSCOPY (EGD) WITH PROPOFOL;  Surgeon: Otis Brace, MD;  [facility];  Service: Gastroenterology;  Laterality: N/A;  . IMPLANTABLE CARDIOVERTER DEFIBRILLATOR IMPLANT  12/19/2012   St. Jude dual-chamber ICD, serial number [id number]  . IMPLANTABLE CARDIOVERTER DEFIBRILLATOR IMPLANT N/A 12/19/2012   Procedure: IMPLANTABLE CARDIOVERTER DEFIBRILLATOR IMPLANT;  Surgeon: Deboraha Sprang, MD;  [facility] LAB;  Service: Cardiovascular;  Laterality: N/A;  . LEFT AND RIGHT HEART CATHETERIZATION WITH CORONARY ANGIOGRAM N/A 12/17/2012   Procedure: LEFT AND RIGHT HEART CATHETERIZATION WITH CORONARY ANGIOGRAM;  Surgeon: Sherren Mocha, MD;  Location: [facility] CATH LAB;  Service: Cardiovascular;  Laterality: N/A;  . POLYPECTOMY  03/19/2019   Procedure: POLYPECTOMY;  Surgeon: Otis Brace, MD;  Location: [place] ENDOSCOPY;  Service: Gastroenterology;;  . Clide Deutscher  01/22/2019   Procedure: SCLEROTHERAPY;  Surgeon: Wilford Corner, MD;  Location: [facility] ENDOSCOPY;  Service: Endoscopy;;     A IV Location/Drains/Wounds Patient Lines/Drains/Airways Status   Active Line/Drains/Airways    Name:   Placement date:   Placement time:   Site:   Days:   Peripheral IV 04/18/19 Left;Lateral Forearm   04/18/19    2012    Forearm   17   Peripheral IV 05/05/19 Left Antecubital   05/05/19  1850    Antecubital   less than 1   External Urinary Catheter   04/16/19    0201    -   19          Intake/Output Last 24 hours  Intake/Output Summary (Last 24 hours) at 05/05/2019 2145 Last data filed at 05/05/2019 2135 Gross per 24 hour  Intake -  Output 250 ml  Net -250 ml     Labs/Imaging Results for orders placed or performed during the hospital encounter of 05/05/19 (from the past 48 hour(s))  CBC with Differential     Status: Abnormal   Collection Time: 05/05/19  6:49 PM  Result Value Ref Range   WBC 9.6 4.0 - 10.5 K/uL   RBC 3.61 (L) 3.87 - 5.11 MIL/uL   Hemoglobin 9.6 (L) 12.0 - 15.0 g/dL   HCT 29.3 (L) 36.0 - 46.0 %   MCV 81.2 80.0 - 100.0 fL   MCH 26.6 26.0 - 34.0 pg   MCHC 32.8 30.0 - 36.0 g/dL   RDW 16.2 (H) 11.5 - 15.5 %   Platelets 333 150 - 400 K/uL   nRBC 0.0 0.0 - 0.2 %   Neutrophils Relative % 64 %   Neutro Abs 6.2 1.7 - 7.7 K/uL   Lymphocytes Relative 20 %   Lymphs Abs 1.9 0.7 - 4.0 K/uL   Monocytes Relative 13 %   Monocytes Absolute 1.2 (H) 0.1 - 1.0 K/uL   Eosinophils Relative 2 %   Eosinophils Absolute 0.1 0.0 - 0.5 K/uL   Basophils Relative 1 %   Basophils Absolute 0.1 0.0 - 0.1 K/uL   Immature Granulocytes 0 %   Abs Immature Granulocytes 0.03 0.00 - 0.07 K/uL    Comment: Performed at [facility], [address] [place], [place] [address]  Comprehensive metabolic panel     Status: Abnormal   Collection Time: 05/05/19  6:49 PM  Result Value Ref Range   Sodium 137 135 - 145 mmol/L   Potassium 3.0 (L) 3.5 - 5.1 mmol/L   Chloride 104 98 - 111 mmol/L   CO2 23 22 - 32 mmol/L   Glucose, Bld 98 70 - 99 mg/dL   BUN 17 8 - 23 mg/dL   Creatinine, Ser 1.60 (H) 0.44 - 1.00 mg/dL   Calcium 8.3 (L) 8.9 - 10.3 mg/dL   Total Protein 6.8 6.5 - 8.1 g/dL   Albumin 2.3 (L) 3.5 - 5.0 g/dL   AST 79 (H) 15 - 41 U/L   ALT 36 0 - 44 U/L   Alkaline Phosphatase 99 38 - 126 U/L   Total Bilirubin 0.8 0.3 - 1.2 mg/dL   GFR calc non Af Amer 31 (L) >60 mL/min   GFR calc Af Amer 36 (L) >60 mL/min   Anion gap 10 5 - 15    Comment: Performed at [facility], [place] [address], [place], [place] [address]  Troponin I (High Sensitivity)     Status: Abnormal   Collection Time: 05/05/19  6:49 PM  Result Value Ref Range   Troponin I (High  Sensitivity) 24 (H) <18 ng/L    Comment: (NOTE) Elevated high sensitivity troponin I (hsTnI) values and significant  changes across serial measurements may suggest ACS but many other  chronic and acute conditions are known to elevate hsTnI results.  Refer to the "Links" section for chest pain algorithms and additional  guidance. Performed at [facility], [place] [address], [place], [place] [address]  Brain natriuretic peptide     Status: Abnormal   Collection Time: 05/05/19  6:49 PM  Result Value Ref Range   B Natriuretic Peptide 1,012.9 (H) 0.0 - 100.0 pg/mL    Comment: Performed at [facility] [address] [place], [place] [address]   Dg Chest 2 View  Result Date: 05/05/2019 CLINICAL DATA:  Shortness of breath, worsening lower extremity edema EXAM: CHEST - 2 VIEW COMPARISON:  Radiograph 09/04/2018 FINDINGS: Stable cardiomegaly with a calcified, tortuous aorta. Pacer/defibrillator pack overlies the left chest wall with leads at the right atrium and cardiac apex. Fine reticular changes throughout the lungs are similar to prior with some more streaky opacity in the lung bases. There is mild central venous congestion without frank edema. No pneumothorax or effusion. Degenerative changes are present in the imaged spine and shoulders. IMPRESSION: Coarse interstitial changes, similar to prior with basilar atelectasis. Central venous congestion and cardiomegaly. No convincing evidence of frank edema. Electronically Signed   By: Lovena Le M.D.   On: 05/05/2019 19:41    Pending Labs Unresulted Labs (From admission, onward)   None      Vitals/Pain Today's Vitals   05/05/19 1805 05/05/19 1930 05/05/19 2100 05/05/19 2130  BP:  (!) 168/70 (!) 163/79 (!) 159/64  Pulse:  70 65 65  Resp:  16 (!) 22 16  Temp:      TempSrc:      SpO2:  93% 95% 93%  Weight: 56 kg     Height: 5' (1.524 m)     PainSc:  0-No pain 8      Isolation Precautions No active  isolations  Medications Medications  furosemide (LASIX) injection 60 mg (60 mg Intravenous Given 05/05/19 2133)  potassium chloride SA (KLOR-CON) CR tablet 40 mEq (40 mEq Oral Given 05/05/19 2143)    Mobility walks Low fall risk   Focused Assessments Cardiac Assessment Handoff:  Cardiac Rhythm: Normal sinus rhythm, Other (Comment)(intermittent atrial pace.) Lab Results  Component Value Date   CKTOTAL 42 04/15/2019   TROPONINI 0.72 ([place]) 06/07/2018   No results found for: DDIMER Does the Patient currently have chest pain? No  , Renal Assessment Handoff:  Hemodialysis Schedule:  Last Hemodialysis date and time:    Restricted appendage:    , Pulmonary Assessment Handoff:  Lung sounds: Bilateral Breath Sounds: Clear L Breath Sounds: Clear R Breath Sounds: Clear O2 Device: Room Air        R Recommendations: See Admitting Provider Note  Report given to:   Additional Notes:

## 2019-05-05 NOTE — ED Notes (Signed)
ED TO INPATIENT HANDOFF REPORT  ED Nurse Name and Phone #: William Hamburger, RN   S Name/Age/Gender Stephanie Yates 73 y.o. female Room/Bed: 020C/020C  Code Status   Code Status: Prior  Home/SNF/Other Home Patient oriented to: self, place, time and situation Is this baseline? No   Triage Complete: Triage complete  Chief Complaint edema  Triage Note Since last PM increased edema to bilateral LE, worse than usual.  C/o SOB without CP.  States she has been taking her meds as ordered.  alert and oriented in NAD.    Allergies Allergies  Allergen Reactions  . Strawberry Extract Hives, Itching and Swelling         Level of Care/Admitting Diagnosis ED Disposition    ED Disposition Condition [facility]: [place] [[id number]]  Level of Care: Telemetry Cardiac [103]  I expect the patient will be discharged within 24 hours: Yes  LOW acuity---Tx typically complete <24 hrs---ACUTE conditions typically can be evaluated <24 hours---LABS likely to return to acceptable levels <24 hours---IS near functional baseline---EXPECTED to return to current living arrangement---NOT newly hypoxic: Meets criteria for 5C-Observation unit  Covid Evaluation: Asymptomatic Screening Protocol (No Symptoms)  Diagnosis: Acute on chronic combined systolic and diastolic CHF (congestive heart failure) ([place]) [161096]  Admitting Physician: Elwyn Reach [[id number]]  Attending Physician: Elwyn Reach [[id number]]  PT Class (Do Not Modify): Observation [104]  PT Acc Code (Do Not Modify): Observation [10022]       B Medical/Surgery History Past Medical History:  Diagnosis Date  . Chronic anemia   . Chronic systolic CHF (congestive heart failure) ([place])   . CKD (chronic kidney disease), stage IV ([place])   . Former tobacco use   . History of tobacco abuse   . Hyperlipidemia   . Hypertension   . Mitral regurgitation   . Nonischemic cardiomyopathy ([place])    a. EF 10-15% on 11/2012 cath  with no significant CAD. b. EF 25% in 2019. b. EF 15% in 12/2018  . PAF (paroxysmal atrial fibrillation) ([place])   . Protein calorie malnutrition ([place])   . Quit consuming alcohol in remote past   . Sinus bradycardia 12/21/2012  . Ventricular tachycardia ([place]) 12/14/2012   Past Surgical History:  Procedure Laterality Date  . BIOPSY  01/22/2019   Procedure: BIOPSY;  Surgeon: Wilford Corner, MD;  Location: [place];  Service: Endoscopy;;  . COLONOSCOPY WITH PROPOFOL N/A 03/19/2019   Procedure: COLONOSCOPY WITH PROPOFOL;  Surgeon: Otis Brace, MD;  Location: [place];  Service: Gastroenterology;  Laterality: N/A;  . ESOPHAGOGASTRODUODENOSCOPY (EGD) WITH PROPOFOL N/A 06/18/2017   Procedure: ESOPHAGOGASTRODUODENOSCOPY (EGD) WITH PROPOFOL;  Surgeon: Ronnette Juniper, MD;  Location: [place];  Service: Gastroenterology;  Laterality: N/A;  . ESOPHAGOGASTRODUODENOSCOPY (EGD) WITH PROPOFOL N/A 01/22/2019   Procedure: ESOPHAGOGASTRODUODENOSCOPY (EGD) WITH PROPOFOL;  Surgeon: Wilford Corner, MD;  Location: [place];  Service: Endoscopy;  Laterality: N/A;  . ESOPHAGOGASTRODUODENOSCOPY (EGD) WITH PROPOFOL N/A 03/19/2019   Procedure: ESOPHAGOGASTRODUODENOSCOPY (EGD) WITH PROPOFOL;  Surgeon: Otis Brace, MD;  Location[facility]OS[facility];  Service: Gastroenterology;  Laterality: N/A;  . IMPLANTABLE CARDIOVERTER DEFIBRILLATOR IMPLANT  12/19/2012   St. Jude dual-chamber ICD, serial number [id number]  . IMPLANTABLE CARDIOVERTER DEFIBRILLATOR IMPLANT N/A 12/19/2012   Procedure: IMPLANTABLE CARDIOVERTER DEFIBRILLATOR IMPLANT;  Surgeon: Deboraha Sprang, MD;  Location: [facility] CATH LAB;  Service: Cardiovascular;  Laterality: N/A;  . LEFT AND RIGHT HEART CATHETERIZATION WITH CORONARY ANGIOGRAM N/A 12/17/2012   Procedure: LEFT AND RIGHT HEART CATHETERIZATION  WITH CORONARY ANGIOGRAM;  Surgeon: Sherren Mocha, MD;  Location: [facility] CATH LAB;  Service: Cardiovascular;  Laterality: N/A;  . POLYPECTOMY  03/19/2019    Procedure: POLYPECTOMY;  Surgeon: Otis Brace, MD;  Location: [place] ENDOSCOPY;  Service: Gastroenterology;;  . Clide Deutscher  01/22/2019   Procedure: SCLEROTHERAPY;  Surgeon: Wilford Corner, MD;  Location: [facility] ENDOSCOPY;  Service: Endoscopy;;     A IV Location/Drains/Wounds Patient Lines/Drains/Airways Status   Active Line/Drains/Airways    Name:   Placement date:   Placement time:   Site:   Days:   Peripheral IV 04/18/19 Left;Lateral Forearm   04/18/19    2012    Forearm   17   Peripheral IV 05/05/19 Left Antecubital   05/05/19    1850    Antecubital   less than 1   External Urinary Catheter   04/16/19    0201    -   19          Intake/Output Last 24 hours  Intake/Output Summary (Last 24 hours) at 05/05/2019 2252 Last data filed at 05/05/2019 2135 Gross per 24 hour  Intake -  Output 250 ml  Net -250 ml    Labs/Imaging Results for orders placed or performed during the hospital encounter of 05/05/19 (from the past 48 hour(s))  CBC with Differential     Status: Abnormal   Collection Time: 05/05/19  6:49 PM  Result Value Ref Range   WBC 9.6 4.0 - 10.5 K/uL   RBC 3.61 (L) 3.87 - 5.11 MIL/uL   Hemoglobin 9.6 (L) 12.0 - 15.0 g/dL   HCT 29.3 (L) 36.0 - 46.0 %   MCV 81.2 80.0 - 100.0 fL   MCH 26.6 26.0 - 34.0 pg   MCHC 32.8 30.0 - 36.0 g/dL   RDW 16.2 (H) 11.5 - 15.5 %   Platelets 333 150 - 400 K/uL   nRBC 0.0 0.0 - 0.2 %   Neutrophils Relative % 64 %   Neutro Abs 6.2 1.7 - 7.7 K/uL   Lymphocytes Relative 20 %   Lymphs Abs 1.9 0.7 - 4.0 K/uL   Monocytes Relative 13 %   Monocytes Absolute 1.2 (H) 0.1 - 1.0 K/uL   Eosinophils Relative 2 %   Eosinophils Absolute 0.1 0.0 - 0.5 K/uL   Basophils Relative 1 %   Basophils Absolute 0.1 0.0 - 0.1 K/uL   Immature Granulocytes 0 %   Abs Immature Granulocytes 0.03 0.00 - 0.07 K/uL    Comment: Performed at [facility], [address], [place], [place] [address]  Comprehensive metabolic panel     Status: Abnormal    Collection Time: 05/05/19  6:49 PM  Result Value Ref Range   Sodium 137 135 - 145 mmol/L   Potassium 3.0 (L) 3.5 - 5.1 mmol/L   Chloride 104 98 - 111 mmol/L   CO2 23 22 - 32 mmol/L   Glucose, Bld 98 70 - 99 mg/dL   BUN 17 8 - 23 mg/dL   Creatinine, Ser 1.60 (H) 0.44 - 1.00 mg/dL   Calcium 8.3 (L) 8.9 - 10.3 mg/dL   Total Protein 6.8 6.5 - 8.1 g/dL   Albumin 2.3 (L) 3.5 - 5.0 g/dL   AST 79 (H) 15 - 41 U/L   ALT 36 0 - 44 U/L   Alkaline Phosphatase 99 38 - 126 U/L   Total Bilirubin 0.8 0.3 - 1.2 mg/dL   GFR calc non Af Amer 31 (L) >60 mL/min   GFR calc Af  Amer 36 (L) >60 mL/min   Anion gap 10 5 - 15    Comment: Performed at [facility], [place] [address] [place], [place] [address]  Troponin I (High Sensitivity)     Status: Abnormal   Collection Time: 05/05/19  6:49 PM  Result Value Ref Range   Troponin I (High Sensitivity) 24 (H) <18 ng/L    Comment: (NOTE) Elevated high sensitivity troponin I (hsTnI) values and significant  changes across serial measurements may suggest ACS but many other  chronic and acute conditions are known to elevate hsTnI results.  Refer to the "Links" section for chest pain algorithms and additional  guidance. Performed at [facility], [place] [address], [place], [place] [address]   Brain natriuretic peptide     Status: Abnormal   Collection Time: 05/05/19  6:49 PM  Result Value Ref Range   B Natriuretic Peptide 1,012.9 (H) 0.0 - 100.0 pg/mL    Comment: Performed at [facility] [address], [place], [place] [address]  Troponin I (High Sensitivity)     Status: Abnormal   Collection Time: 05/05/19  9:36 PM  Result Value Ref Range   Troponin I (High Sensitivity) 26 (H) <18 ng/L    Comment: (NOTE) Elevated high sensitivity troponin I (hsTnI) values and significant  changes across serial measurements may suggest ACS but many other  chronic and acute conditions are known to elevate hsTnI results.  Refer to the "Links" section for  chest pain algorithms and additional  guidance. Performed at [facility], [place] [address], [place], [place] [address]    Dg Chest 2 View  Result Date: 05/05/2019 CLINICAL DATA:  Shortness of breath, worsening lower extremity edema EXAM: CHEST - 2 VIEW COMPARISON:  Radiograph 09/04/2018 FINDINGS: Stable cardiomegaly with a calcified, tortuous aorta. Pacer/defibrillator pack overlies the left chest wall with leads at the right atrium and cardiac apex. Fine reticular changes throughout the lungs are similar to prior with some more streaky opacity in the lung bases. There is mild central venous congestion without frank edema. No pneumothorax or effusion. Degenerative changes are present in the imaged spine and shoulders. IMPRESSION: Coarse interstitial changes, similar to prior with basilar atelectasis. Central venous congestion and cardiomegaly. No convincing evidence of frank edema. Electronically Signed   By: Lovena Le M.D.   On: 05/05/2019 19:41    Pending Labs Unresulted Labs (From admission, onward)    Start     Ordered   05/05/19 2237  SARS CORONAVIRUS 2 (TAT 6-24 HRS) Nasopharyngeal Nasopharyngeal Swab  (Asymptomatic/Tier 2 Patients Labs)  Once,   STAT    Question Answer Comment  Is this test for diagnosis or screening Screening   Symptomatic for COVID-19 as defined by CDC No   Hospitalized for COVID-19 No   Admitted to ICU for COVID-19 No   Previously tested for COVID-19 Yes   Resident in a congregate (group) care setting No   Employed in healthcare setting No   Pregnant No      05/05/19 2237   Signed and Held  Basic metabolic panel  Daily,   R     Signed and Held   Signed and Held  CBC WITH DIFFERENTIAL  Tomorrow morning,   R     Signed and Held          Vitals/Pain Today's Vitals   05/05/19 1805 05/05/19 1930 05/05/19 2100 05/05/19 2130  BP:  (!) 168/70 (!) 163/79 (!) 159/64  Pulse:  70 65 65  Resp:  16 (!) 22 16  Temp:      TempSrc:      SpO2:  93% 95% 93%   Weight: 56 kg     Height: 5' (1.524 m)     PainSc:  0-No pain 8      Isolation Precautions No active isolations  Medications Medications  furosemide (LASIX) injection 60 mg (60 mg Intravenous Given 05/05/19 2133)  potassium chloride SA (KLOR-CON) CR tablet 40 mEq (40 mEq Oral Given 05/05/19 2143)    Mobility walks Low fall risk   Focused Assessments Cardiac Assessment Handoff:  Cardiac Rhythm: Normal sinus rhythm, Other (Comment)(intermittent atrial pace.) Lab Results  Component Value Date   CKTOTAL 42 04/15/2019   TROPONINI 0.72 ([place]) 06/07/2018   No results found for: DDIMER Does the Patient currently have chest pain? No  , Pulmonary Assessment Handoff:  Lung sounds: Bilateral Breath Sounds: Clear L Breath Sounds: Clear R Breath Sounds: Clear O2 Device: Room Air        R Recommendations: See Admitting Provider Note  Report given to:   Additional Notes:

## 2019-05-05 NOTE — H&P (Signed)
History and Physical   Stephanie Yates:[id number] DOB: January 15, 1946 DOA: 05/05/2019  Referring MD/NP/PA: Dr. Billy Fischer  PCP: Josetta Huddle, MD   Outpatient Specialists: Dr. Virl Axe, cardiology  Patient coming from: Home  Chief Complaint: Shortness of breath  HPI: Stephanie Yates is a 73 y.o. female with medical history significant of dilated cardiomyopathy with systolic dysfunction CHF with EF of 15%, chronic kidney disease stage IV, anemia of chronic disease, atrial fibrillation, hypertension, who presents to the ER with progressive shortness of breath and worsening lower extremity edema.  Patient is on 80 mg of Lasix at home.  She has noted worsening edema and shortness of breath despite adhering to her medications.  Denies any chest pain.  Denied any cough.  Patient has shortness of breath even at rest.  She appears to have increased fluid overload.  Patient is being admitted to the hospital for treatment of worsening CHF.Marland Kitchen  ED Course: Temperature 99 blood pressure 145/108 pulse 70 respirate 22 oxygen sats 93% on room air.  Sodium is 137 potassium 3.2 chloride 104 CO2 to of 23 glucose 98 BUN 17 creatinine 1.60 and calcium 8.3.  White count is 9.6 hemoglobin 9.6 and platelet count of 333.  BNP is 1012 troponin is 24.  Chest x-ray showed bibasilar atelectasis with central venous congestion slight cardiomegaly.  Patient being admitted with CHF exacerbation  Review of Systems: As per HPI otherwise 10 point review of systems negative.    Past Medical History:  Diagnosis Date  . Chronic anemia   . Chronic systolic CHF (congestive heart failure) ([place])   . CKD (chronic kidney disease), stage IV ([place])   . Former tobacco use   . History of tobacco abuse   . Hyperlipidemia   . Hypertension   . Mitral regurgitation   . Nonischemic cardiomyopathy ([place])    a. EF 10-15% on 11/2012 cath with no significant CAD. b. EF 25% in 2019. b. EF 15% in 12/2018  . PAF (paroxysmal atrial fibrillation)  ([place])   . Protein calorie malnutrition ([place])   . Quit consuming alcohol in remote past   . Sinus bradycardia 12/21/2012  . Ventricular tachycardia ([place]) 12/14/2012    Past Surgical History:  Procedure Laterality Date  . BIOPSY  01/22/2019   Procedure: BIOPSY;  Surgeon: Wilford Corner, MD;  Location: [place];  Service: Endoscopy;;  . COLONOSCOPY WITH PROPOFOL N/A 03/19/2019   Procedure: COLONOSCOPY WITH PROPOFOL;  Surgeon: Otis Brace, MD;  Location: [place];  Service: Gastroenterology;  Laterality: N/A;  . ESOPHAGOGASTRODUODENOSCOPY (EGD) WITH PROPOFOL N/A 06/18/2017   Procedure: ESOPHAGOGASTRODUODENOSCOPY (EGD) WITH PROPOFOL;  Surgeon: Ronnette Juniper, MD;  Location: [place];  Service: Gastroenterology;  Laterality: N/A;  . ESOPHAGOGASTRODUODENOSCOPY (EGD) WITH PROPOFOL N/A 01/22/2019   Procedure: ESOPHAGOGASTRODUODENOSCOPY (EGD) WITH PROPOFOL;  Surgeon: Wilford Corner, MD;  Location: [facility];  Service: Endoscopy;  Laterality: N/A;  . ESOPHAGOGASTRODUODENOSCOPY (EGD) WITH PROPOFOL N/A 03/19/2019   Procedure: ESOPHAGOGASTRODUODENOSCOPY (EGD) WITH PROPOFOL;  Surgeon: Otis Brace, MD;  Location: [facility] ENDOSCOPY;  Service: Gastroenterology;  Laterality: N/A;  . IMPLANTABLE CARDIOVERTER DEFIBRILLATOR IMPLANT  12/19/2012   [employer]. Jude dual-chamber ICD, serial number [id number]  . IMPLANTABLE CARDIOVERTER DEFIBRILLATOR IMPLANT N/A 12/19/2012   Procedure: IMPLANTABLE CARDIOVERTER DEFIBRILLATOR IMPLANT;  Surgeon: Deboraha Sprang, MD;  Location: [facility] CATH LAB;  Service: Cardiovascular;  Laterality: N/A;  . LEFT AND RIGHT HEART CATHETERIZATION WITH CORONARY ANGIOGRAM N/A 12/17/2012   Procedure: LEFT AND RIGHT HEART CATHETERIZATION WITH CORONARY ANGIOGRAM;  Surgeon: Sherren Mocha, MD;  Location: [facility] CATH LAB;  Service: Cardiovascular;  Laterality: N/A;  . POLYPECTOMY  03/19/2019   Procedure: POLYPECTOMY;  Surgeon: Otis Brace, MD;  Location[facility] ENDOSCOPY;  Service: Gastroenterology;;   . SCLEROTHERAPY  01/22/2019   Procedure: SCLEROTHERAPY;  Surgeon: Wilford Corner, MD;  Location: [facility] ENDOSCOPY;  Service: Endoscopy;;     reports that she has quit smoking. She has never used smokeless tobacco. She reports that she does not drink alcohol or use drugs.  Allergies  Allergen Reactions  . Strawberry Extract Hives, Itching and Swelling         Family History  Problem Relation Age of Onset  . Cancer Mother      Prior to Admission medications   Medication Sig Start Date End Date Taking? Authorizing Provider  acetaminophen (TYLENOL) 500 MG tablet Take 1,000 mg by mouth every 6 (six) hours as needed (pain).   Yes [provider]  albuterol (VENTOLIN HFA) 108 (90 Base) MCG/ACT inhaler Inhale 2 puffs into the lungs every 4 (four) hours as needed for wheezing or shortness of breath.   Yes [provider]  amiodarone (PACERONE) 200 MG tablet Take 1 tablet (200 mg total) by mouth daily. 02/15/19  Yes [name], PA-C  apixaban (ELIQUIS) 2.5 MG TABS tablet Take 1 tablet (2.5 mg total) by mouth 2 (two) times daily. 03/19/19  Yes Debbe Odea, MD  atorvastatin (LIPITOR) 40 MG tablet Take 1 tablet (40 mg total) by mouth daily at 6 PM. 07/05/18  Yes Deboraha Sprang, MD  carvedilol (COREG) 3.125 MG tablet Take 1 tablet (3.125 mg total) by mouth 2 (two) times daily with a meal. 09/12/18  Yes Deboraha Sprang, MD  Cholecalciferol (VITAMIN D-3) 25 MCG (1000 UT) CAPS Take 1,000 Units by mouth at bedtime.    Yes [provider]  Ensure (ENSURE) Take 237 mLs by mouth daily.   Yes [provider]  ferrous gluconate (FERGON) 324 MG tablet Take 324 mg by mouth 2 (two) times daily.  07/05/18  Yes [provider]  furosemide (LASIX) 80 MG tablet Take 0.5 tablets (40 mg total) by mouth daily. 04/19/19  Yes Nita Sells, MD  hydrALAZINE (APRESOLINE) 25 MG tablet Take 0.5 tablets (12.5 mg total) by mouth 3 (three) times daily. 01/12/19  Yes  Guilford Shi, MD  MAGNESIUM-OXIDE 400 (241.3 Mg) MG tablet TAKE 1 TABLET BY MOUTH ONCE DAILY Patient taking differently: Take 400 mg by mouth daily as needed (antacid).  02/07/19  Yes Deboraha Sprang, MD  mexiletine (MEXITIL) 200 MG capsule Take 1 capsule (200 mg total) by mouth 2 (two) times daily. 01/24/19  Yes [name], PA-C  nitroGLYCERIN (NITROSTAT) 0.4 MG SL tablet Place 1 tablet (0.4 mg total) under the tongue every 5 (five) minutes as needed for chest pain. 01/12/19  Yes Guilford Shi, MD  pantoprazole (PROTONIX) 40 MG tablet Take 1 tablet (40 mg total) by mouth daily. 04/05/19 07/04/19 Yes [place] Eric J, DO  polyethylene glycol (MIRALAX / GLYCOLAX) 17 g packet Take 17 g by mouth daily. Can take up to twice a day for constipation. Patient taking differently: Take 17 g by mouth daily as needed (constipation).  03/19/19  Yes Debbe Odea, MD  sucralfate (CARAFATE) 1 GM/10ML suspension Take 10 mLs (1 g total) by mouth 4 (four) times daily -  with meals and at bedtime. 03/19/19  Yes Debbe Odea, MD  ondansetron (ZOFRAN ODT) 4 MG disintegrating tablet Take 1 tablet (4 mg total) by mouth every  8 (eight) hours as needed for nausea or vomiting. Patient not taking: Reported on 05/05/2019 03/21/19   [place] Eric J, [place]    Physical Exam: Vitals:   05/05/19 1805 05/05/19 1930 05/05/19 2100 05/05/19 2130  BP:  (!) 168/70 (!) 163/79 (!) 159/64  Pulse:  70 65 65  Resp:  16 (!) 22 16  Temp:      TempSrc:      SpO2:  93% 95% 93%  Weight: 56 kg     Height: 5' (1.524 m)         Constitutional: NAD, calm, chronically ill looking Vitals:   05/05/19 1805 05/05/19 1930 05/05/19 2100 05/05/19 2130  BP:  (!) 168/70 (!) 163/79 (!) 159/64  Pulse:  70 65 65  Resp:  16 (!) 22 16  Temp:      TempSrc:      SpO2:  93% 95% 93%  Weight: 56 kg     Height: 5' (1.524 m)      Eyes: PERRL, lids and conjunctivae normal ENMT: Mucous membranes are moist. Posterior pharynx clear of any exudate or  lesions.Normal dentition.  Neck: normal, supple, no masses, no thyromegaly Respiratory: Diffuse bilateral crackles with good air entry but no wheeze. Normal respiratory effort. No accessory muscle use.  Cardiovascular: Regular rate and rhythm, no murmurs / rubs / gallops. 2+ extremity edema. 2+ pedal pulses. No carotid bruits.  Abdomen: no tenderness, no masses palpated. No hepatosplenomegaly. Bowel sounds positive.  Musculoskeletal: no clubbing / cyanosis. No joint deformity upper and lower extremities. Good ROM, no contractures. Normal muscle tone.  Skin: no rashes, lesions, ulcers. No induration Neurologic: CN 2-12 grossly intact. Sensation intact, DTR normal. Strength 5/5 in all 4.  Psychiatric: Normal judgment and insight. Alert and oriented x 3. Normal mood.     Labs on Admission: I have personally reviewed following labs and imaging studies  CBC: Recent Labs  Lab 05/05/19 1849  WBC 9.6  NEUTROABS 6.2  HGB 9.6*  HCT 29.3*  MCV 81.2  PLT 709   Basic Metabolic Panel: Recent Labs  Lab 05/05/19 1849  NA 137  K 3.0*  CL 104  CO2 23  GLUCOSE 98  BUN 17  CREATININE 1.60*  CALCIUM 8.3*   GFR: Estimated Creatinine Clearance: 23.8 mL/min (A) (by C-G formula based on SCr of 1.6 mg/dL (H)). Liver Function Tests: Recent Labs  Lab 05/05/19 1849  AST 79*  ALT 36  ALKPHOS 99  BILITOT 0.8  PROT 6.8  ALBUMIN 2.3*   No results for input(s): LIPASE, AMYLASE in the last 168 hours. No results for input(s): AMMONIA in the last 168 hours. Coagulation Profile: No results for input(s): INR, PROTIME in the last 168 hours. Cardiac Enzymes: No results for input(s): CKTOTAL, CKMB, CKMBINDEX, TROPONINI in the last 168 hours. BNP (last 3 results) No results for input(s): PROBNP in the last 8760 hours. HbA1C: No results for input(s): HGBA1C in the last 72 hours. CBG: No results for input(s): GLUCAP in the last 168 hours. Lipid Profile: No results for input(s): CHOL, HDL, LDLCALC,  TRIG, CHOLHDL, LDLDIRECT in the last 72 hours. Thyroid Function Tests: No results for input(s): TSH, T4TOTAL, FREET4, T3FREE, THYROIDAB in the last 72 hours. Anemia Panel: No results for input(s): VITAMINB12, FOLATE, FERRITIN, TIBC, IRON, RETICCTPCT in the last 72 hours. Urine analysis:    Component Value Date/Time   COLORURINE YELLOW 04/16/2019 [place] 04/16/2019 0218   LABSPEC 1.009 04/16/2019 0218   PHURINE 6.0 04/16/2019  [place] 04/16/2019 0218   HGBUR LARGE (A) 04/16/2019 [place] 04/16/2019 [place] 04/16/2019 0218   PROTEINUR NEGATIVE 04/16/2019 0218   UROBILINOGEN 1.0 08/29/2014 2035   NITRITE NEGATIVE 04/16/2019 0218   LEUKOCYTESUR NEGATIVE 04/16/2019 0218   Sepsis Labs: @LABRCNTIP (procalcitonin:4,lacticidven:4) )No results found for this or any previous visit (from the past 240 hour(s)).   Radiological Exams on Admission: Dg Chest 2 View  Result Date: 05/05/2019 CLINICAL DATA:  Shortness of breath, worsening lower extremity edema EXAM: CHEST - 2 VIEW COMPARISON:  Radiograph 09/04/2018 FINDINGS: Stable cardiomegaly with a calcified, tortuous aorta. Pacer/defibrillator pack overlies the left chest wall with leads at the right atrium and cardiac apex. Fine reticular changes throughout the lungs are similar to prior with some more streaky opacity in the lung bases. There is mild central venous congestion without frank edema. No pneumothorax or effusion. Degenerative changes are present in the imaged spine and shoulders. IMPRESSION: Coarse interstitial changes, similar to prior with basilar atelectasis. Central venous congestion and cardiomegaly. No convincing evidence of frank edema. Electronically Signed   By: Lovena Le M.D.   On: 05/05/2019 19:41    EKG: Independently reviewed.  Shows sinus rhythm with prolonged PR interval and left bundle branch block with a rate of 63.  Appears to be unchanged from  previous.  Low voltage in general.  Assessment/Plan Principal Problem:   Acute on chronic combined systolic and diastolic CHF (congestive heart failure) (HCC) Active Problems:   Hypertension   Nonischemic cardiomyopathy (HCC)   Hypokalemia   PAF (paroxysmal atrial fibrillation) (H[facility])   HLD (hyperlipidemia)   Chronic anticoagulation   CKD (chronic kidney disease) stage 4, GFR 15-29 ml/min (HCC)     #1 acute exacerbation of chronic CHF: Patient has EF of only 10 to 15%.  We will admit the patient.  Initiate diuresis with IV Lasix.  Continue other cardiac medications.  May consider cardiac consult in the morning.  #2 nonischemic cardiomyopathy: Continue as above.  #3 hypertension: Resume home regimen and continue treatment  #4 hypokalemia: Replete potassium  #5 paroxysmal atrial fibrillation: Currently in sinus rhythm.  Continue treatment  #6 chronic kidney disease stage IV: Continue to monitor renal function.   DVT prophylaxis: Apixaban Code Status: Full code Family Communication: No family at bedside Disposition Plan: Home Consults called: None but cardiology may be consulted in the morning Admission status: Observation  Severity of Illness: The appropriate patient status for this patient is OBSERVATION. Observation status is judged to be reasonable and necessary in order to provide the required intensity of service to ensure the patient's safety. The patient's presenting symptoms, physical exam findings, and initial radiographic and laboratory data in the context of their medical condition is felt to place them at decreased risk for further clinical deterioration. Furthermore, it is anticipated that the patient will be medically stable for discharge from the hospital within 2 midnights of admission. The following factors support the patient status of observation.   " The patient's presenting symptoms include shortness of breath and lower extremity edema. " The physical exam  findings include bilateral lower extremity edema. " The initial radiographic and laboratory data are vascular congestion.     Barbette Merino MD [facility] Pager [phone]  If 7PM-7AM, please contact night-coverage [email or web address] Password [facility]  05/05/2019, 9:57 PM

## 2019-05-05 NOTE — ED Triage Notes (Signed)
Since last PM increased edema to bilateral LE, worse than usual.  C/o SOB without CP.  States she has been taking her meds as ordered.  alert and oriented in NAD.

## 2019-05-06 DIAGNOSIS — Z7901 Long term (current) use of anticoagulants: Secondary | ICD-10-CM | POA: Diagnosis not present

## 2019-05-06 DIAGNOSIS — D631 Anemia in chronic kidney disease: Secondary | ICD-10-CM | POA: Diagnosis present

## 2019-05-06 DIAGNOSIS — I472 Ventricular tachycardia: Secondary | ICD-10-CM | POA: Diagnosis present

## 2019-05-06 DIAGNOSIS — Z7189 Other specified counseling: Secondary | ICD-10-CM | POA: Diagnosis not present

## 2019-05-06 DIAGNOSIS — Z87442 Personal history of urinary calculi: Secondary | ICD-10-CM | POA: Diagnosis not present

## 2019-05-06 DIAGNOSIS — I130 Hypertensive heart and chronic kidney disease with heart failure and stage 1 through stage 4 chronic kidney disease, or unspecified chronic kidney disease: Secondary | ICD-10-CM | POA: Diagnosis present

## 2019-05-06 DIAGNOSIS — I340 Nonrheumatic mitral (valve) insufficiency: Secondary | ICD-10-CM | POA: Diagnosis present

## 2019-05-06 DIAGNOSIS — N184 Chronic kidney disease, stage 4 (severe): Secondary | ICD-10-CM | POA: Diagnosis present

## 2019-05-06 DIAGNOSIS — E8770 Fluid overload, unspecified: Secondary | ICD-10-CM | POA: Diagnosis present

## 2019-05-06 DIAGNOSIS — I447 Left bundle-branch block, unspecified: Secondary | ICD-10-CM | POA: Diagnosis present

## 2019-05-06 DIAGNOSIS — I5043 Acute on chronic combined systolic (congestive) and diastolic (congestive) heart failure: Secondary | ICD-10-CM | POA: Diagnosis present

## 2019-05-06 DIAGNOSIS — Z95810 Presence of automatic (implantable) cardiac defibrillator: Secondary | ICD-10-CM | POA: Diagnosis not present

## 2019-05-06 DIAGNOSIS — Z20828 Contact with and (suspected) exposure to other viral communicable diseases: Secondary | ICD-10-CM | POA: Diagnosis present

## 2019-05-06 DIAGNOSIS — Z515 Encounter for palliative care: Secondary | ICD-10-CM | POA: Diagnosis not present

## 2019-05-06 DIAGNOSIS — I480 Paroxysmal atrial fibrillation: Secondary | ICD-10-CM | POA: Diagnosis present

## 2019-05-06 DIAGNOSIS — E860 Dehydration: Secondary | ICD-10-CM | POA: Diagnosis present

## 2019-05-06 DIAGNOSIS — I951 Orthostatic hypotension: Secondary | ICD-10-CM | POA: Diagnosis present

## 2019-05-06 DIAGNOSIS — E876 Hypokalemia: Secondary | ICD-10-CM | POA: Diagnosis present

## 2019-05-06 DIAGNOSIS — I10 Essential (primary) hypertension: Secondary | ICD-10-CM | POA: Diagnosis not present

## 2019-05-06 DIAGNOSIS — K829 Disease of gallbladder, unspecified: Secondary | ICD-10-CM | POA: Diagnosis present

## 2019-05-06 DIAGNOSIS — K5900 Constipation, unspecified: Secondary | ICD-10-CM | POA: Diagnosis present

## 2019-05-06 DIAGNOSIS — I420 Dilated cardiomyopathy: Secondary | ICD-10-CM | POA: Diagnosis present

## 2019-05-06 DIAGNOSIS — D649 Anemia, unspecified: Secondary | ICD-10-CM | POA: Diagnosis present

## 2019-05-06 DIAGNOSIS — E785 Hyperlipidemia, unspecified: Secondary | ICD-10-CM | POA: Diagnosis present

## 2019-05-06 DIAGNOSIS — R531 Weakness: Secondary | ICD-10-CM | POA: Diagnosis not present

## 2019-05-06 DIAGNOSIS — Z91018 Allergy to other foods: Secondary | ICD-10-CM | POA: Diagnosis not present

## 2019-05-06 DIAGNOSIS — K828 Other specified diseases of gallbladder: Secondary | ICD-10-CM | POA: Diagnosis present

## 2019-05-06 DIAGNOSIS — Z79899 Other long term (current) drug therapy: Secondary | ICD-10-CM | POA: Diagnosis not present

## 2019-05-06 DIAGNOSIS — Z87891 Personal history of nicotine dependence: Secondary | ICD-10-CM | POA: Diagnosis not present

## 2019-05-06 LAB — CBC WITH DIFFERENTIAL/PLATELET
Abs Immature Granulocytes: 0.05 10*3/uL (ref 0.00–0.07)
Basophils Absolute: 0.1 10*3/uL (ref 0.0–0.1)
Basophils Relative: 1 %
Eosinophils Absolute: 0.2 10*3/uL (ref 0.0–0.5)
Eosinophils Relative: 1 %
HCT: 27.6 % — ABNORMAL LOW (ref 36.0–46.0)
Hemoglobin: 9.2 g/dL — ABNORMAL LOW (ref 12.0–15.0)
Immature Granulocytes: 1 %
Lymphocytes Relative: 23 %
Lymphs Abs: 2.5 10*3/uL (ref 0.7–4.0)
MCH: 26.9 pg (ref 26.0–34.0)
MCHC: 33.3 g/dL (ref 30.0–36.0)
MCV: 80.7 fL (ref 80.0–100.0)
Monocytes Absolute: 1.3 10*3/uL — ABNORMAL HIGH (ref 0.1–1.0)
Monocytes Relative: 13 %
Neutro Abs: 6.4 10*3/uL (ref 1.7–7.7)
Neutrophils Relative %: 61 %
Platelets: 336 10*3/uL (ref 150–400)
RBC: 3.42 MIL/uL — ABNORMAL LOW (ref 3.87–5.11)
RDW: 15.9 % — ABNORMAL HIGH (ref 11.5–15.5)
WBC: 10.5 10*3/uL (ref 4.0–10.5)
nRBC: 0 % (ref 0.0–0.2)

## 2019-05-06 LAB — BASIC METABOLIC PANEL
Anion gap: 8 (ref 5–15)
BUN: 17 mg/dL (ref 8–23)
CO2: 26 mmol/L (ref 22–32)
Calcium: 8.3 mg/dL — ABNORMAL LOW (ref 8.9–10.3)
Chloride: 105 mmol/L (ref 98–111)
Creatinine, Ser: 1.49 mg/dL — ABNORMAL HIGH (ref 0.44–1.00)
GFR calc Af Amer: 39 mL/min — ABNORMAL LOW (ref >60)
GFR calc non Af Amer: 34 mL/min — ABNORMAL LOW (ref >60)
Glucose, Bld: 85 mg/dL (ref 70–99)
Potassium: 3.5 mmol/L (ref 3.5–5.1)
Sodium: 139 mmol/L (ref 135–145)

## 2019-05-06 LAB — SARS CORONAVIRUS 2 (TAT 6-24 HRS): SARS Coronavirus 2: NEGATIVE

## 2019-05-06 MED ORDER — ACETAMINOPHEN 500 MG PO TABS: 1000.0000 mg | ORAL_TABLET | Freq: Four times a day (QID) | ORAL | Status: DC | PRN

## 2019-05-06 MED ORDER — AMIODARONE HCL 200 MG PO TABS
200.0000 mg | ORAL_TABLET | Freq: Every day | ORAL | Status: DC
  Administered 2019-05-06 – 2019-05-13 (×8): 200 mg via ORAL
  Filled 2019-05-06 (×8): qty 1

## 2019-05-06 MED ORDER — PANTOPRAZOLE SODIUM 40 MG PO TBEC
40.0000 mg | DELAYED_RELEASE_TABLET | Freq: Every day | ORAL | Status: DC
  Administered 2019-05-06 – 2019-05-14 (×9): 40 mg via ORAL
  Filled 2019-05-06 (×10): qty 1

## 2019-05-06 MED ORDER — SODIUM CHLORIDE 0.9% FLUSH
3.0000 mL | INTRAVENOUS | Status: DC | PRN
  Administered 2019-05-08: 3 mL via INTRAVENOUS
  Filled 2019-05-06: qty 3

## 2019-05-06 MED ORDER — MAGNESIUM OXIDE 400 (241.3 MG) MG PO TABS: 400.0000 mg | ORAL_TABLET | Freq: Every day | ORAL | Status: DC | PRN

## 2019-05-06 MED ORDER — POTASSIUM CHLORIDE CRYS ER 20 MEQ PO TBCR
40.0000 meq | EXTENDED_RELEASE_TABLET | ORAL | Status: AC
  Administered 2019-05-06 (×2): 40 meq via ORAL
  Filled 2019-05-06 (×2): qty 2

## 2019-05-06 MED ORDER — SUCRALFATE 1 GM/10ML PO SUSP
1.0000 g | Freq: Three times a day (TID) | ORAL | Status: DC
  Administered 2019-05-06 – 2019-05-14 (×35): 1 g via ORAL
  Filled 2019-05-06 (×35): qty 10

## 2019-05-06 MED ORDER — POLYETHYLENE GLYCOL 3350 17 G PO PACK: 17.0000 g | PACK | Freq: Every day | ORAL | Status: DC | PRN

## 2019-05-06 MED ORDER — APIXABAN 2.5 MG PO TABS
2.5000 mg | ORAL_TABLET | Freq: Two times a day (BID) | ORAL | Status: DC
  Administered 2019-05-06 – 2019-05-14 (×17): 2.5 mg via ORAL
  Filled 2019-05-06 (×17): qty 1

## 2019-05-06 MED ORDER — SODIUM CHLORIDE 0.9% FLUSH
3.0000 mL | Freq: Two times a day (BID) | INTRAVENOUS | Status: DC
  Administered 2019-05-06 – 2019-05-14 (×16): 3 mL via INTRAVENOUS

## 2019-05-06 MED ORDER — CARVEDILOL 3.125 MG PO TABS
3.1250 mg | ORAL_TABLET | Freq: Two times a day (BID) | ORAL | Status: DC
  Administered 2019-05-06 – 2019-05-14 (×18): 3.125 mg via ORAL
  Filled 2019-05-06 (×18): qty 1

## 2019-05-06 MED ORDER — FUROSEMIDE 10 MG/ML IJ SOLN
40.0000 mg | Freq: Two times a day (BID) | INTRAMUSCULAR | Status: DC
  Administered 2019-05-06 – 2019-05-09 (×7): 40 mg via INTRAVENOUS
  Filled 2019-05-06 (×7): qty 4

## 2019-05-06 MED ORDER — ALBUTEROL SULFATE (2.5 MG/3ML) 0.083% IN NEBU: 3.0000 mL | INHALATION_SOLUTION | RESPIRATORY_TRACT | Status: DC | PRN

## 2019-05-06 MED ORDER — ATORVASTATIN CALCIUM 40 MG PO TABS
40.0000 mg | ORAL_TABLET | Freq: Every day | ORAL | Status: DC
  Administered 2019-05-06 – 2019-05-14 (×9): 40 mg via ORAL
  Filled 2019-05-06 (×10): qty 1

## 2019-05-06 MED ORDER — ONDANSETRON HCL 4 MG/2ML IJ SOLN
4.0000 mg | Freq: Four times a day (QID) | INTRAMUSCULAR | Status: DC | PRN
  Administered 2019-05-08 – 2019-05-12 (×7): 4 mg via INTRAVENOUS
  Filled 2019-05-06 (×7): qty 2

## 2019-05-06 MED ORDER — MEXILETINE HCL 200 MG PO CAPS
200.0000 mg | ORAL_CAPSULE | Freq: Two times a day (BID) | ORAL | Status: DC
  Administered 2019-05-06 – 2019-05-14 (×17): 200 mg via ORAL
  Filled 2019-05-06 (×19): qty 1

## 2019-05-06 MED ORDER — FERROUS GLUCONATE 324 (38 FE) MG PO TABS
324.0000 mg | ORAL_TABLET | Freq: Two times a day (BID) | ORAL | Status: DC
  Administered 2019-05-06 – 2019-05-14 (×17): 324 mg via ORAL
  Filled 2019-05-06 (×21): qty 1

## 2019-05-06 MED ORDER — ENSURE ENLIVE PO LIQD
237.0000 mL | Freq: Every day | ORAL | Status: DC
  Administered 2019-05-06 – 2019-05-14 (×9): 237 mL via ORAL

## 2019-05-06 MED ORDER — VITAMIN D 25 MCG (1000 UNIT) PO TABS
1000.0000 [IU] | ORAL_TABLET | Freq: Every day | ORAL | Status: DC
  Administered 2019-05-06 – 2019-05-13 (×8): 1000 [IU] via ORAL
  Filled 2019-05-06 (×8): qty 1

## 2019-05-06 MED ORDER — HYDRALAZINE HCL 25 MG PO TABS
12.5000 mg | ORAL_TABLET | Freq: Three times a day (TID) | ORAL | Status: DC
  Administered 2019-05-06 – 2019-05-14 (×25): 12.5 mg via ORAL
  Filled 2019-05-06 (×26): qty 1

## 2019-05-06 MED ORDER — SODIUM CHLORIDE 0.9 % IV SOLN
250.0000 mL | INTRAVENOUS | Status: DC | PRN
  Administered 2019-05-13: 250 mL via INTRAVENOUS

## 2019-05-06 MED ORDER — ACETAMINOPHEN 325 MG PO TABS
650.0000 mg | ORAL_TABLET | ORAL | Status: DC | PRN
  Administered 2019-05-06 – 2019-05-13 (×3): 650 mg via ORAL
  Filled 2019-05-06 (×3): qty 2

## 2019-05-06 MED ORDER — NITROGLYCERIN 0.4 MG SL SUBL: 0.4000 mg | SUBLINGUAL_TABLET | SUBLINGUAL | Status: DC | PRN

## 2019-05-06 NOTE — Evaluation (Signed)
Physical Therapy Evaluation Patient Details Name: Stephanie Yates MRN: [id number] DOB: [birth year] Today's Date: 05/06/2019   History of Present Illness  73 year old female admitted with B LE swelling, SOB. Patient with CHF, HTN, Afib, HLD, CKD. frequent admissions for same.  Clinical Impression  Patient received sitting up in recliner. Patient reports she just walked in the hall with someone. (was mobility tech). Patient ambulated 30 feet in room, no AD, singe hand held assist. Patient reaching for furniture to help steady. Would benefit from use of AD. Patient reports B LE pain due to swelling, discussed monitoring of her weight on daily basis to better managed CHF. Patient will benefit from continued skilled PT to improve strength and functional independence.      Follow Up Recommendations Home health PT;Other (comment)(HH RN for CHF management)    Equipment Recommendations  None recommended by PT;Other (comment)(TBD)    Recommendations for Other Services       Precautions / Restrictions Precautions Precautions: Fall Precaution Comments: mod fall Restrictions Weight Bearing Restrictions: No      Mobility  Bed Mobility               General bed mobility comments: not assessed, patient was up in recliner  Transfers Overall transfer level: Needs assistance Equipment used: None Transfers: Sit to/from Stand Sit to Stand: Supervision            Ambulation/Gait Ambulation/Gait assistance: Min assist Gait Distance (Feet): 30 Feet Assistive device: 1 person hand held assist Gait Pattern/deviations: Step-through pattern;Drifts right/left Gait velocity: decreased   General Gait Details: Patient ambulated  reporting LE soreness, min assist for safety. Patient reaching out for furniture to steady self.  Stairs            Wheelchair Mobility    Modified Rankin (Stroke Patients Only)       Balance Overall balance assessment: Needs  assistance Sitting-balance support: Feet supported Sitting balance-Leahy Scale: Good     Standing balance support: Single extremity supported;During functional activity Standing balance-Leahy Scale: Fair Standing balance comment: needs external support from therapist. Would benefit from AD, but states she does not use.                             Pertinent Vitals/Pain Pain Assessment: Faces Faces Pain Scale: Hurts little more Pain Location: b LEs Pain Descriptors / Indicators: Sore;Discomfort Pain Intervention(s): Limited activity within patient's tolerance;Monitored during session;Repositioned    Home Living Family/patient expects to be discharged to:: Private residence Living Arrangements: Alone Available Help at Discharge: Family;Available PRN/intermittently Type of Home: House Home Access: Stairs to enter Entrance Stairs-Rails: Right Entrance Stairs-Number of Steps: 3 Home Layout: One level Home Equipment: Cane - single point Additional Comments: did not use AD    Prior Function Level of Independence: Independent               Hand Dominance   Dominant Hand: Right    Extremity/Trunk Assessment   Upper Extremity Assessment Upper Extremity Assessment: Generalized weakness    Lower Extremity Assessment Lower Extremity Assessment: Generalized weakness       Communication   Communication: No difficulties  Cognition Arousal/Alertness: Awake/alert Behavior During Therapy: WFL for tasks assessed/performed Overall Cognitive Status: Within Functional Limits for tasks assessed  General Comments      Exercises Total Joint Exercises Ankle Circles/Pumps: AROM;10 reps;Both   Assessment/Plan    PT Assessment Patient needs continued PT services  PT Problem List Decreased strength;Decreased mobility;Decreased activity tolerance;Decreased balance;Pain;Decreased safety awareness       PT  Treatment Interventions DME instruction;Gait training;Stair training;Functional mobility training;Therapeutic activities;Therapeutic exercise;Balance training;Patient/family education    PT Goals (Current goals can be found in the Care Plan section)  Acute Rehab PT Goals Patient Stated Goal: to return home PT Goal Formulation: With patient Time For Goal Achievement: 05/20/19 Potential to Achieve Goals: Good    Frequency Min 3X/week   Barriers to discharge Decreased caregiver support      Co-evaluation               AM-PAC PT "6 Clicks" Mobility  Outcome Measure Help needed turning from your back to your side while in a flat bed without using bedrails?: None Help needed moving from lying on your back to sitting on the side of a flat bed without using bedrails?: A Little Help needed moving to and from a bed to a chair (including a wheelchair)?: A Little Help needed standing up from a chair using your arms (e.g., wheelchair or bedside chair)?: A Little Help needed to walk in hospital room?: A Little Help needed climbing 3-5 steps with a railing? : A Little 6 Click Score: 19    End of Session Equipment Utilized During Treatment: Gait belt Activity Tolerance: Patient tolerated treatment well Patient left: in chair;with call bell/phone within reach Nurse Communication: Mobility status PT Visit Diagnosis: Muscle weakness (generalized) (M62.81);Pain;Difficulty in walking, not elsewhere classified (R26.2) Pain - Right/Left: (bilateral) Pain - part of body: Leg    Time: 1330-1350 PT Time Calculation (min) (ACUTE ONLY): 20 min   Charges:   PT Evaluation $PT Eval Moderate Complexity: 1 Mod PT Treatments $Gait Training: 8-22 mins        Bethany Hirt, PT, GCS 05/06/19,2:05 PM

## 2019-05-06 NOTE — Plan of Care (Signed)
  Problem: Education: Goal: Knowledge of General Education information will improve Description: Including pain rating scale, medication(s)/side effects and non-pharmacologic comfort measures Outcome: Progressing   Problem: Clinical Measurements: Goal: Respiratory complications will improve Outcome: Progressing   Problem: Clinical Measurements: Goal: Cardiovascular complication will be avoided Outcome: Progressing   Problem: Safety: Goal: Ability to remain free from injury will improve Outcome: Progressing   

## 2019-05-06 NOTE — Discharge Instructions (Signed)

## 2019-05-06 NOTE — Consult Note (Signed)
Consultation Note Date: 05/06/2019   Patient Name: Stephanie Yates  DOB: 09/22/1945  MRN: [id number]  Age / Sex: 73 y.o., female  PCP: Josetta Huddle, MD Referring Physician: Geradine Girt, DO  Reason for Consultation: Establishing goals of care and Psychosocial/spiritual support  HPI/Patient Profile: 73 y.o. female   admitted on 05/05/2019 with past medical history significant for dilated cardiomyopathy with systolic dysfunction CHF with EF of 15%, chronic kidney disease stage IV, anemia of chronic disease, atrial fibrillation, hypertension, who presents to the ER with progressive shortness of breath and worsening lower extremity edema.  Patient is on 80 mg of Lasix at home.    She has  worsening edema and shortness of breath despite adhering to her medications.    Patient is being admitted to the hospital for treatment of worsening CHF.   Patient has had 6 admission in the past 6 months for similar episodes   ED Course: Temperature 99 blood pressure 145/108 pulse 70 respirate 22 oxygen sats 93% on room air.  Sodium is 137 potassium 3.2 chloride 104 CO2 to of 23 glucose 98 BUN 17 creatinine 1.60 and calcium 8.3.  White count is 9.6 hemoglobin 9.6 and platelet count of 333.  BNP is 1012 troponin is 24.  Chest x-ray showed bibasilar atelectasis with central venous congestion slight cardiomegaly.  Patient being admitted with CHF exacerbation  Patient and family face ongoing treatment option decisions, advanced directive decisions and anticipatory care needs    Clinical Assessment and Goals of Care:  This NP Wadie Lessen reviewed medical records, received report from team, assessed the patient and then meet at the patient's bedside  to discuss diagnosis, prognosis, GOC, EOL wishes disposition and options.  I spoke to the daughter [name] by phone for similar conversation  Concept of Hospice and  Palliative Care were discussed  A discussion was had today regarding advanced directives.  Concepts specific to code status, artifical feeding and hydration, continued IV antibiotics and rehospitalization was had.  The difference between a aggressive medical intervention path  and a palliative comfort care path for this patient at this time was had.  Values and goals of care important to patient and family were attempted to be elicited.   Questions and concerns addressed.   Family encouraged to call with questions or concerns.    PMT will continue to support holistically.    No documented HPOA    SUMMARY OF RECOMMENDATIONS    Code Status/Advance Care Planning: - Full code -Encourage patient and family to consider DNR/DNI status knowing poor outcomes in similar patients.  Patient currently has an active ICD, we discussed deactivation  Palliative Prophylaxis:   Aspiration, Delirium Protocol, Frequent Pain Assessment and Oral Care  Additional Recommendations (Limitations, Scope, Preferences):  Full Scope Treatment  Psycho-social/Spiritual:   Desire for further Chaplaincy support:no-declined  Created space and opportunity for patient to explore her thoughts and feelings regarding her current medical situation.  Patient is not interested in "talking about any of this, just let it be"  Prognosis:   < 6 months  Discharge Planning:  Currently she has OP community based Palliative services with [facility]  Patient is refusing ANY placement other than home      Primary Diagnoses: Present on Admission: . Hypertension . Acute on chronic combined systolic and diastolic CHF (congestive heart failure) ([place]) . PAF (paroxysmal atrial fibrillation) ([place]) . HLD (hyperlipidemia) . CKD (chronic kidney disease) stage 4, GFR 15-29 ml/min (HCC) . Hypokalemia . ICD (implantable cardioverter-defibrillator), dual, [facility] . Gallbladder mass   I have reviewed the medical record,  interviewed the patient and family, and examined the patient. The following aspects are pertinent.  Past Medical History:  Diagnosis Date  . Chronic anemia   . Chronic systolic CHF (congestive heart failure) ([place])   . CKD (chronic kidney disease), stage IV ([place])   . Former tobacco use   . History of tobacco abuse   . Hyperlipidemia   . Hypertension   . Mitral regurgitation   . Nonischemic cardiomyopathy ([place])    a. EF 10-15% on 11/2012 cath with no significant CAD. b. EF 25% in 2019. b. EF 15% in 12/2018  . PAF (paroxysmal atrial fibrillation) ([place])   . Protein calorie malnutrition ([place])   . Quit consuming alcohol in remote past   . Sinus bradycardia 12/21/2012  . Ventricular tachycardia ([place]) 12/14/2012   Social History   Socioeconomic History  . Marital status: Widowed    Spouse name: Not on file  . Number of children: Not on file  . Years of education: Not on file  . Highest education level: Not on file  Occupational History  . Not on file  Social Needs  . Financial resource strain: Not on file  . Food insecurity    Worry: Not on file    Inability: Not on file  . Transportation needs    Medical: Not on file    Non-medical: Not on file  Tobacco Use  . Smoking status: Former Research scientist (life sciences)  . Smokeless tobacco: Never Used  Substance and Sexual Activity  . Alcohol use: No  . Drug use: No  . Sexual activity: Not Currently  Lifestyle  . Physical activity    Days per week: Not on file    Minutes per session: Not on file  . Stress: Not on file  Relationships  . Social Herbalist on phone: Not on file    Gets together: Not on file    Attends religious service: Not on file    Active member of club or organization: Not on file    Attends meetings of clubs or organizations: Not on file    Relationship status: Not on file  Other Topics Concern  . Not on file  Social History Narrative  . Not on file   Family History  Problem Relation Age of Onset  . Cancer Mother     Scheduled Meds: . amiodarone  200 mg Oral Daily  . apixaban  2.5 mg Oral BID  . atorvastatin  40 mg Oral q1800  . carvedilol  3.125 mg Oral BID WC  . cholecalciferol  1,000 Units Oral QHS  . feeding supplement (ENSURE ENLIVE)  237 mL Oral Daily  . ferrous gluconate  324 mg Oral BID WC  . furosemide  40 mg Intravenous BID  . hydrALAZINE  12.5 mg Oral TID  . mexiletine  200 mg Oral BID  . pantoprazole  40 mg Oral Daily  . sodium chloride flush  3 mL  Intravenous Q12H  . sucralfate  1 g Oral TID WC & HS   Continuous Infusions: . sodium chloride     PRN Meds:.sodium chloride, acetaminophen, albuterol, magnesium oxide, nitroGLYCERIN, ondansetron (ZOFRAN) IV, polyethylene glycol, sodium chloride flush Medications Prior to Admission:  Prior to Admission medications   Medication Sig Start Date End Date Taking? Authorizing Provider  acetaminophen (TYLENOL) 500 MG tablet Take 1,000 mg by mouth every 6 (six) hours as needed (pain).   Yes [provider]  albuterol (VENTOLIN HFA) 108 (90 Base) MCG/ACT inhaler Inhale 2 puffs into the lungs every 4 (four) hours as needed for wheezing or shortness of breath.   Yes [provider]  amiodarone (PACERONE) 200 MG tablet Take 1 tablet (200 mg total) by mouth daily. 02/15/19  Yes [name], PA-C  apixaban (ELIQUIS) 2.5 MG TABS tablet Take 1 tablet (2.5 mg total) by mouth 2 (two) times daily. 03/19/19  Yes Debbe Odea, MD  atorvastatin (LIPITOR) 40 MG tablet Take 1 tablet (40 mg total) by mouth daily at 6 PM. 07/05/18  Yes Deboraha Sprang, MD  carvedilol (COREG) 3.125 MG tablet Take 1 tablet (3.125 mg total) by mouth 2 (two) times daily with a meal. 09/12/18  Yes Deboraha Sprang, MD  Cholecalciferol (VITAMIN D-3) 25 MCG (1000 UT) CAPS Take 1,000 Units by mouth at bedtime.    Yes [provider]  Ensure (ENSURE) Take 237 mLs by mouth daily.   Yes [provider]  ferrous gluconate (FERGON) 324 MG tablet Take 324 mg  by mouth 2 (two) times daily.  07/05/18  Yes [provider]  furosemide (LASIX) 80 MG tablet Take 0.5 tablets (40 mg total) by mouth daily. 04/19/19  Yes Nita Sells, MD  hydrALAZINE (APRESOLINE) 25 MG tablet Take 0.5 tablets (12.5 mg total) by mouth 3 (three) times daily. 01/12/19  Yes Guilford Shi, MD  MAGNESIUM-OXIDE 400 (241.3 Mg) MG tablet TAKE 1 TABLET BY MOUTH ONCE DAILY Patient taking differently: Take 400 mg by mouth daily as needed (antacid).  02/07/19  Yes Deboraha Sprang, MD  mexiletine (MEXITIL) 200 MG capsule Take 1 capsule (200 mg total) by mouth 2 (two) times daily. 01/24/19  Yes [name], PA-C  nitroGLYCERIN (NITROSTAT) 0.4 MG SL tablet Place 1 tablet (0.4 mg total) under the tongue every 5 (five) minutes as needed for chest pain. 01/12/19  Yes Guilford Shi, MD  pantoprazole (PROTONIX) 40 MG tablet Take 1 tablet (40 mg total) by mouth daily. 04/05/19 07/04/19 Yes [place] Eric J, DO  polyethylene glycol (MIRALAX / GLYCOLAX) 17 g packet Take 17 g by mouth daily. Can take up to twice a day for constipation. Patient taking differently: Take 17 g by mouth daily as needed (constipation).  03/19/19  Yes Debbe Odea, MD  sucralfate (CARAFATE) 1 GM/10ML suspension Take 10 mLs (1 g total) by mouth 4 (four) times daily -  with meals and at bedtime. 03/19/19  Yes Debbe Odea, MD  ondansetron (ZOFRAN ODT) 4 MG disintegrating tablet Take 1 tablet (4 mg total) by mouth every 8 (eight) hours as needed for nausea or vomiting. Patient not taking: Reported on 05/05/2019 03/21/19   [place] Eric J, DO   Allergies  Allergen Reactions  . Strawberry Extract Hives, Itching and Swelling        Review of Systems  Constitutional: Positive for fatigue.  Neurological: Positive for weakness.    Physical Exam Constitutional:      Appearance: She is normal weight. She  is ill-appearing.  Cardiovascular:     Rate and Rhythm: Normal rate.  Pulmonary:     Effort: Pulmonary  effort is normal.  Neurological:     Mental Status: She is alert.     Vital Signs: BP 110/60 (BP Location: Left Arm)   Pulse 60   Temp 98.3 F (36.8 C) (Oral)   Resp 18   Ht 5' (1.524 m)   Wt 61.7 kg Comment: scale b  SpO2 94%   BMI 26.58 kg/m  Pain Scale: 0-10   Pain Score: 8    SpO2: SpO2: 94 % O2 Device:SpO2: 94 % O2 Flow Rate: .   IO: Intake/output summary:   Intake/Output Summary (Last 24 hours) at 05/06/2019 1416 Last data filed at 05/06/2019 1300 Gross per 24 hour  Intake 922 ml  Output 2600 ml  Net -1678 ml    LBM: Last BM Date: 05/05/19 Baseline Weight: Weight: 56 kg Most recent weight: Weight: 61.7 kg(scale b)     Palliative Assessment/Data: 30 %    Discussed with bedside RN  Time In: 1530 Time Out: 1640 Time Total: 70 minutes Greater than 50%  of this time was spent counseling and coordinating care related to the above assessment and plan.  Signed by: Wadie Lessen, NP   Please contact Palliative Medicine Team phone at [phone] for questions and concerns.  For individual provider: See Shea Evans

## 2019-05-06 NOTE — Progress Notes (Signed)
[facility] PROGRESS NOTE  Stephanie Yates KZS:[id number] DOB: 06-07-1946 DOA: 05/05/2019 PCP: Josetta Huddle, MD  Assessment/Plan:  #1. Acute on chronic combined congestive heart failure. EF 10%.  BNP elevated. Chest xray with mild central venous congestion. Provided with IV lasix. Improved with lasix. Chart review indicated weight up from last admission to [facility] from [place]. Of note, patient lives alone with family nearby, monthly hospitalizaitons since July.  -continue lasix -continue home amiodarone, coreg,  -monitor electrolytes and kidney function -intake and output -daily weight -OP follow up with cards -palliative care for goals of care  #2. Hypokalemia. k 3.0 at presentation. repleted and is 3.5 this am -kdur 27meq bid x2 today -recheck in am -check mag level  #3. CKD III. Creatinine at baseline -monitor closely given need for IV lasix  #4. Hypertension. Fair control. Home meds include  -continue home meds -monitor  #5. PAF. Currently sr. Home meds include eliquis and BB as well as mexitil.  -continue home meds -monitor  #6. Non-ischemic cardiomyopathy. See #1. No chest pain. HS trop 24 and 26. ekg with sr and LBBB -see above  Code Status: full Family Communication: daughter leslie on phone. Patient refused snf in past. Has been admitted monthly since July. Daughter indicates "shes doing good" but agreeable to discuss goals of care with Palliative.  Disposition Plan: home   Consultants:  palliative  Procedures:    Antibiotics:    HPI/Subjective: Sitting up in bed and reports feeling "better". Reports no appetite  Objective: Vitals:   05/06/19 0529 05/06/19 0737  BP: 135/65 129/61  Pulse: 64 66  Resp: 20 18  Temp: 98.1 F (36.7 C) 98.2 F (36.8 C)  SpO2: 98% 96%    Intake/Output Summary (Last 24 hours) at 05/06/2019 1153 Last data filed at 05/06/2019 0840 Gross per 24 hour  Intake 702 ml  Output 2200 ml  Net -1498 ml   Filed Weights   05/05/19 1805 05/06/19 0011  Weight: 56 kg 61.7 kg    Exam:   General:  Awake alert no acute distress  Cardiovascular: rrr no mgr 1+ LE edema  Respiratory: normal effort BS with fine crackles bilateral bases  Abdomen: soft +BS no guarding or rebounding  Musculoskeletal: joints without swelling/erythema   Data Reviewed: Basic Metabolic Panel: Recent Labs  Lab 05/05/19 1849 05/06/19 0524  NA 137 139  K 3.0* 3.5  CL 104 105  CO2 23 26  GLUCOSE 98 85  BUN 17 17  CREATININE 1.60* 1.49*  CALCIUM 8.3* 8.3*   Liver Function Tests: Recent Labs  Lab 05/05/19 1849  AST 79*  ALT 36  ALKPHOS 99  BILITOT 0.8  PROT 6.8  ALBUMIN 2.3*   No results for input(s): LIPASE, AMYLASE in the last 168 hours. No results for input(s): AMMONIA in the last 168 hours. CBC: Recent Labs  Lab 05/05/19 1849 05/06/19 0524  WBC 9.6 10.5  NEUTROABS 6.2 6.4  HGB 9.6* 9.2*  HCT 29.3* 27.6*  MCV 81.2 80.7  PLT 333 336   Cardiac Enzymes: No results for input(s): CKTOTAL, CKMB, CKMBINDEX, TROPONINI in the last 168 hours. BNP (last 3 results) Recent Labs    01/10/19 0422 04/15/19 2022 05/05/19 1849  BNP 1,735.2* 566.4* 1,012.9*    ProBNP (last 3 results) No results for input(s): PROBNP in the last 8760 hours.  CBG: No results for input(s): GLUCAP in the last 168 hours.  Recent Results (from the past 240 hour(s))  SARS CORONAVIRUS 2 (TAT 6-24 HRS) Nasopharyngeal Nasopharyngeal  Swab     Status: None   Collection Time: 05/05/19  9:42 PM   Specimen: Nasopharyngeal Swab  Result Value Ref Range Status   SARS Coronavirus 2 NEGATIVE NEGATIVE Final    Comment: (NOTE) SARS-CoV-2 target nucleic acids are NOT DETECTED. The SARS-CoV-2 RNA is generally detectable in upper and lower respiratory specimens during the acute phase of infection. Negative results do not preclude SARS-CoV-2 infection, do not rule out co-infections with other pathogens, and should not be used as the sole basis for  treatment or other patient management decisions. Negative results must be combined with clinical observations, patient history, and epidemiological information. The expected result is Negative. Fact Sheet for Patients: SugarRoll.be Fact Sheet for Healthcare Providers: https://www.woods-mathews.com/ This test is not yet approved or cleared by the [place] FDA and  has been authorized for detection and/or diagnosis of SARS-CoV-2 by FDA under an Emergency Use Authorization (EUA). This EUA will remain  in effect (meaning this test can be used) for the duration of the COVID-19 declaration under Section 56 4(b)(1) of the Act, 21 U.S.C. section 360bbb-3(b)(1), unless the authorization is terminated or revoked sooner. Performed at [facility], [place] [address] [place], [place] [address]      Studies: Dg Chest 2 View  Result Date: 05/05/2019 CLINICAL DATA:  Shortness of breath, worsening lower extremity edema EXAM: CHEST - 2 VIEW COMPARISON:  Radiograph 09/04/2018 FINDINGS: Stable cardiomegaly with a calcified, tortuous aorta. Pacer/defibrillator pack overlies the left chest wall with leads at the right atrium and cardiac apex. Fine reticular changes throughout the lungs are similar to prior with some more streaky opacity in the lung bases. There is mild central venous congestion without frank edema. No pneumothorax or effusion. Degenerative changes are present in the imaged spine and shoulders. IMPRESSION: Coarse interstitial changes, similar to prior with basilar atelectasis. Central venous congestion and cardiomegaly. No convincing evidence of frank edema. Electronically Signed   By: Lovena Le M.D.   On: 05/05/2019 19:41    Scheduled Meds: . amiodarone  200 mg Oral Daily  . apixaban  2.5 mg Oral BID  . atorvastatin  40 mg Oral q1800  . carvedilol  3.125 mg Oral BID WC  . cholecalciferol  1,000 Units Oral QHS  . feeding supplement  (ENSURE ENLIVE)  237 mL Oral Daily  . ferrous gluconate  324 mg Oral BID WC  . furosemide  40 mg Intravenous BID  . hydrALAZINE  12.5 mg Oral TID  . mexiletine  200 mg Oral BID  . pantoprazole  40 mg Oral Daily  . potassium chloride  40 mEq Oral Q4H  . sodium chloride flush  3 mL Intravenous Q12H  . sucralfate  1 g Oral TID WC & HS   Continuous Infusions: . sodium chloride      Principal Problem:   Acute on chronic combined systolic and diastolic CHF (congestive heart failure) (HCC) Active Problems:   Nonischemic cardiomyopathy (HCC)   Hypokalemia   PAF (paroxysmal atrial fibrillation) (HCC)   Hypertension   HLD (hyperlipidemia)   Chronic anticoagulation   ICD (implantable cardioverter-defibrillator), dual, [facility]   Gallbladder mass   CKD (chronic kidney disease) stage 4, GFR 15-29 ml/min (HCC)    Time spent: 73 minutes    [facility] M NP  [facility]  If 7PM-7AM, please contact night-coverage at [email or web address], password [facility] 05/06/2019, 11:53 AM  LOS: 0 days

## 2019-05-07 DIAGNOSIS — Z515 Encounter for palliative care: Secondary | ICD-10-CM

## 2019-05-07 LAB — BASIC METABOLIC PANEL
Anion gap: 7 (ref 5–15)
BUN: 19 mg/dL (ref 8–23)
CO2: 25 mmol/L (ref 22–32)
Calcium: 8.3 mg/dL — ABNORMAL LOW (ref 8.9–10.3)
Chloride: 106 mmol/L (ref 98–111)
Creatinine, Ser: 1.66 mg/dL — ABNORMAL HIGH (ref 0.44–1.00)
GFR calc Af Amer: 35 mL/min — ABNORMAL LOW (ref >60)
GFR calc non Af Amer: 30 mL/min — ABNORMAL LOW (ref >60)
Glucose, Bld: 95 mg/dL (ref 70–99)
Potassium: 4.4 mmol/L (ref 3.5–5.1)
Sodium: 138 mmol/L (ref 135–145)

## 2019-05-07 LAB — MAGNESIUM: Magnesium: 1.8 mg/dL (ref 1.7–2.4)

## 2019-05-07 NOTE — Progress Notes (Signed)
Patient ID: Stephanie Yates, female   DOB: Jul 13, 1945, 73 y.o.   MRN: [id number]   This NP visited patient at the bedside as a follow up for palliative medicine needs and emotional support  Patient is alert, she appears tired.  She is oriented x3. Continued conversation regarding diagnosis, prognosis, GOCs, disposition and anticipatory care needs.  Patient was able to clearly verbalize that she does not want intubation now or in the future, however she remains hopeful for improvement and to be medically maximized before discharge home.  I raised awareness to the  limitations of medical interventions to prolong life when a body begins to fail to thrive.   Discussed home hospice, [name] adamantly refused. Agrees to continue OP community based palliative services.  Spoke to daughter, she understands the seriousness of her medical situation, but will totally support "whatever her mother wants to do".  Patient is high risk to decompensate  Discussed with patient the importance of continued conversation with family and the  medical providers regarding overall plan of care and treatment options,  ensuring decisions are within the context of the patients values and GOCs.  Questions and concerns addressed   Discussed with [name] NP  Total time spent on the unit was 40 minutes  Greater than 50% of the time was spent in counseling and coordination of care  Wadie Lessen NP  Palliative Medicine Team Team Phone # [phone] Pager [phone]

## 2019-05-07 NOTE — Progress Notes (Signed)
[facility] PROGRESS NOTE  Stephanie Yates WSF:[id number] DOB: 1945-09-16 DOA: 05/05/2019 PCP: Josetta Huddle, MD  Assessment/Plan:  #1. Acute on chronic combined congestive heart failure. EF 15%.  BNP elevated. Chest xray with mild central venous congestion. Provided with IV lasix. Improved with lasix. Chart review indicated weight up from last admission to [place] from [place]. Of note, patient lives alone with family nearby, monthly hospitalizaitons since July.  -continue IV lasix -continue home amiodarone, coreg,  -monitor electrolytes and kidney function -intake and output -daily weight -OP follow up with cards -palliative care for goals of care discussion is ongoing  #2. Hypokalemia -Monitor  #3. CKD III. Creatinine at baseline -monitor closely given need for IV lasix  #4. Hypertension. Fair control. Home meds include  -continue home meds -monitor  #5. PAF. Currently sr. Home meds include eliquis and BB as well as mexitil.  -continue home meds -monitor  #6. Non-ischemic cardiomyopathy. See #1. No chest pain. HS trop 24 and 26. ekg with sr and LBBB -see above  Code Status: full  Disposition Plan: home   Consultants:  palliative  Procedures:    Antibiotics:    HPI/Subjective: Patient states she still feels mildly short of breath but feels that her leg swelling has improved some.  She does not wear oxygen at home.  She also denies any cough.  She does endorse some mild abdominal distention and bloating.  Objective: Vitals:   05/07/19 0816 05/07/19 1242  BP: (!) 146/64 (!) 124/58  Pulse: 71 64  Resp:  18  Temp:  97.8 F (36.6 C)  SpO2:  98%    Intake/Output Summary (Last 24 hours) at 05/07/2019 1409 Last data filed at 05/07/2019 1300 Gross per 24 hour  Intake 480 ml  Output 1950 ml  Net -1470 ml   Filed Weights   05/05/19 1805 05/06/19 0011 05/07/19 0711  Weight: 56 kg 61.7 kg 61.2 kg    Exam:   General:  Awake alert no acute  distress  Cardiovascular: rrr no mgr 1+ LE edema  Respiratory: normal effort BS with fine crackles bilateral bases  Abdomen: soft +BS no guarding or rebounding  Musculoskeletal: joints without swelling/erythema    Principal Problem:   Acute on chronic combined systolic and diastolic CHF (congestive heart failure) (HCC) Active Problems:   Hypertension   Nonischemic cardiomyopathy (HCC)   Hypokalemia   PAF (paroxysmal atrial fibrillation) ([place])   ICD (implantable cardioverter-defibrillator), dual, [facility]   HLD (hyperlipidemia)   Chronic anticoagulation   DNR (do not resuscitate) discussion   CKD (chronic kidney disease) stage 4, GFR 15-29 ml/min (HCC)   Gallbladder mass   Volume overload   Palliative care by specialist    Time spent: 45 minutes    Charolotte Capuchin MD [facility]  If 7PM-7AM, please contact night-coverage at [email or web address], password [email or web address] 05/07/2019, 2:09 PM  LOS: 1 day

## 2019-05-08 LAB — BASIC METABOLIC PANEL
Anion gap: 9 (ref 5–15)
BUN: 22 mg/dL (ref 8–23)
CO2: 26 mmol/L (ref 22–32)
Calcium: 8.6 mg/dL — ABNORMAL LOW (ref 8.9–10.3)
Chloride: 102 mmol/L (ref 98–111)
Creatinine, Ser: 1.61 mg/dL — ABNORMAL HIGH (ref 0.44–1.00)
GFR calc Af Amer: 36 mL/min — ABNORMAL LOW (ref >60)
GFR calc non Af Amer: 31 mL/min — ABNORMAL LOW (ref >60)
Glucose, Bld: 91 mg/dL (ref 70–99)
Potassium: 4.4 mmol/L (ref 3.5–5.1)
Sodium: 137 mmol/L (ref 135–145)

## 2019-05-08 NOTE — Progress Notes (Signed)
Physical Therapy Treatment Patient Details Name: Stephanie Yates MRN: [id number] DOB: May 13, 1946 Today's Date: 05/08/2019    History of Present Illness 73 year old female admitted with B LE swelling, SOB. Patient with CHF, HTN, Afib, HLD, CKD. frequent admissions for same.    PT Comments    Patient seen for mobility progression. Pt tolerated gait distance of 100 ft with standing rest break. Pt declined use of AD however reaches for something to hold onto for balance while ambulating. Discussed need for use of RW upon d/c for decreased risk of falls and energy conservation. Pt with SpO2 92% or > on RA. Pt will continue to benefit from further skilled PT services to maximize independence and safety with mobility.     Follow Up Recommendations  Home health PT;Other (comment)(HH RN for CHF management)     Equipment Recommendations  None recommended by PT(pt reports having RW and SPC at home)    Recommendations for Other Services       Precautions / Restrictions Precautions Precautions: Fall Restrictions Weight Bearing Restrictions: No    Mobility  Bed Mobility Overal bed mobility: Modified Independent Bed Mobility: Supine to Sit;Sit to Supine           General bed mobility comments: increased time/effort; use of rail  Transfers Overall transfer level: Needs assistance Equipment used: None Transfers: Sit to/from Stand Sit to Stand: Supervision         General transfer comment: supervision for safety  Ambulation/Gait Ambulation/Gait assistance: Min assist Gait Distance (Feet): 100 Feet   Gait Pattern/deviations: Step-through pattern;Decreased stride length;Trunk flexed;Drifts right/left Gait velocity: decreased   General Gait Details: pt declines use of AD and reaching for rail in hallway and other objects to hold onto for balance throughout; discussed use of RW upon d/c for balance and energy conservation   Stairs             Wheelchair Mobility     Modified Rankin (Stroke Patients Only)       Balance Overall balance assessment: Needs assistance Sitting-balance support: Feet supported Sitting balance-Leahy Scale: Good     Standing balance support: Single extremity supported;During functional activity;No upper extremity supported Standing balance-Leahy Scale: Fair                              Cognition Arousal/Alertness: Awake/alert Behavior During Therapy: WFL for tasks assessed/performed Overall Cognitive Status: No family/caregiver present to determine baseline cognitive functioning Area of Impairment: Safety/judgement                         Safety/Judgement: Decreased awareness of deficits;Decreased awareness of safety     General Comments: pt declines using AD during session but reaching for something to hold onto while mobilizing      Exercises      General Comments General comments (skin integrity, edema, etc.): pt with O2 on upon arrival; SpO2 92% or > on RA throughout session      Pertinent Vitals/Pain Pain Assessment: No/denies pain    Home Living                      Prior Function            PT Goals (current goals can now be found in the care plan section) Acute Rehab PT Goals Patient Stated Goal: to return home Progress towards PT goals: Progressing toward goals  Frequency    Min 3X/week      PT Plan Current plan remains appropriate    Co-evaluation              AM-PAC PT "6 Clicks" Mobility   Outcome Measure  Help needed turning from your back to your side while in a flat bed without using bedrails?: None Help needed moving from lying on your back to sitting on the side of a flat bed without using bedrails?: A Little Help needed moving to and from a bed to a chair (including a wheelchair)?: A Little Help needed standing up from a chair using your arms (e.g., wheelchair or bedside chair)?: A Little Help needed to walk in hospital room?: A  Little Help needed climbing 3-5 steps with a railing? : A Little 6 Click Score: 19    End of Session Equipment Utilized During Treatment: Gait belt Activity Tolerance: Patient tolerated treatment well Patient left: with call bell/phone within reach;in bed Nurse Communication: Mobility status PT Visit Diagnosis: Muscle weakness (generalized) (M62.81);Pain;Difficulty in walking, not elsewhere classified (R26.2) Pain - Right/Left: (bilateral) Pain - part of body: Leg     Time: 1425-1448 PT Time Calculation (min) (ACUTE ONLY): 23 min  Charges:  $Gait Training: 23-37 mins                     Earney Navy, PTA Acute Rehabilitation Services Pager: [phone] Office: [phone]     Darliss Cheney 05/08/2019, 3:49 PM

## 2019-05-08 NOTE — Progress Notes (Signed)
Pharmacist Heart Failure Core Measure Documentation  Assessment: Stephanie Yates has an EF documented as 15% on 7/20 by echo.  Rationale: Heart failure patients with left ventricular systolic dysfunction (LVSD) and an EF < 40% should be prescribed an angiotensin converting enzyme inhibitor (ACEI) or angiotensin receptor blocker (ARB) at discharge unless a contraindication is documented in the medical record.  This patient is not currently on an ACEI or ARB for HF.  This note is being placed in the record in order to provide documentation that a contraindication to the use of these agents is present for this encounter.  ACE Inhibitor or Angiotensin Receptor Blocker is contraindicated (specify all that apply)  []   ACEI allergy AND ARB allergy []   Angioedema []   Moderate or severe aortic stenosis []   Hyperkalemia []   Hypotension []   Renal artery stenosis [x]   Worsening renal function, preexisting renal disease or dysfunction   Georgina Peer 05/08/2019 2:49 PM

## 2019-05-08 NOTE — Progress Notes (Signed)
[facility] PROGRESS NOTE  Stephanie Yates SLH:[id number] DOB: 1945-07-12 DOA: 05/05/2019 PCP: Josetta Huddle, MD  Assessment/Plan:  #1. Acute on chronic combined congestive heart failure. EF 15%.  BNP elevated. Chest xray with mild central venous congestion. Provided with IV lasix. Improved with lasix. Chart review indicated weight up from last admission to [place] from [place]. Of note, patient lives alone with family nearby, monthly hospitalizaitons since July.  -continue IV lasix -continue home amiodarone, coreg,  -monitor electrolytes and kidney function -intake and output -daily weight -OP follow up with cards -palliative care for goals of care discussion is ongoing  #2. Hypokalemia -Monitor  #3. CKD III. Creatinine at baseline -monitor closely given need for IV lasix  #4. Hypertension. Fair control. Home meds include  -continue home meds -monitor  #5. PAF. Currently sr. Home meds include eliquis and BB as well as mexitil.  -continue home meds -monitor  #6. Non-ischemic cardiomyopathy. See #1. No chest pain. HS trop 24 and 26. ekg with sr and LBBB -see above  Code Status: full  Disposition Plan: home   Consultants:  palliative  Procedures:    Antibiotics:    HPI/Subjective: Patient states that her breathing is slightly improved but still not at her baseline.  She also endorses nausea.  Objective: Vitals:   05/08/19 0806 05/08/19 1131  BP: 137/60 119/60  Pulse: 62 60  Resp: 16 15  Temp: (!) 97.4 F (36.3 C) (!) 97.5 F (36.4 C)  SpO2: 98% 100%    Intake/Output Summary (Last 24 hours) at 05/08/2019 1507 Last data filed at 05/08/2019 1132 Gross per 24 hour  Intake 480 ml  Output 1575 ml  Net -1095 ml   Filed Weights   05/06/19 0011 05/07/19 0711 05/08/19 0530  Weight: 61.7 kg 61.2 kg 60.7 kg    Exam:   General:  Awake alert no acute distress  Cardiovascular: rrr no mgr 1+ LE edema  Respiratory: normal effort BS with fine crackles  bilateral bases  Abdomen: Softer than yesterday, +BS, no guarding or rebounding  Musculoskeletal: joints without swelling/erythema    Principal Problem:   Acute on chronic combined systolic and diastolic CHF (congestive heart failure) (HCC) Active Problems:   Hypertension   Nonischemic cardiomyopathy (HCC)   Hypokalemia   PAF (paroxysmal atrial fibrillation) ([place])   ICD (implantable cardioverter-defibrillator), dual, [facility]   HLD (hyperlipidemia)   Chronic anticoagulation   DNR (do not resuscitate) discussion   CKD (chronic kidney disease) stage 4, GFR 15-29 ml/min (HCC)   Gallbladder mass   Volume overload   Palliative care by specialist    Time spent: 25 minutes    Charolotte Capuchin MD [facility]  If 7PM-7AM, please contact night-coverage at [email or web address], password [facility] 05/08/2019, 3:07 PM  LOS: 2 days

## 2019-05-08 NOTE — Plan of Care (Signed)
  Problem: Education: Goal: Knowledge of General Education information will improve Description Including pain rating scale, medication(s)/side effects and non-pharmacologic comfort measures Outcome: Progressing   

## 2019-05-08 NOTE — Plan of Care (Signed)
Problem: Education: Goal: Knowledge of General Education information will improve Description: Including pain rating scale, medication(s)/side effects and non-pharmacologic comfort measures 05/08/2019 1156 by Arlina Robes, RN Outcome: Progressing 05/08/2019 1155 by Arlina Robes, RN Outcome: Progressing   Problem: Health Behavior/Discharge Planning: Goal: Ability to manage health-related needs will improve 05/08/2019 1156 by Arlina Robes, RN Outcome: Progressing 05/08/2019 1155 by Arlina Robes, RN Outcome: Progressing   Problem: Clinical Measurements: Goal: Ability to maintain clinical measurements within normal limits will improve 05/08/2019 1156 by Arlina Robes, RN Outcome: Progressing 05/08/2019 1155 by Arlina Robes, RN Outcome: Progressing Goal: Will remain free from infection 05/08/2019 1156 by Arlina Robes, RN Outcome: Progressing 05/08/2019 1155 by Arlina Robes, RN Outcome: Progressing Goal: Diagnostic test results will improve 05/08/2019 1156 by Arlina Robes, RN Outcome: Progressing 05/08/2019 1155 by Arlina Robes, RN Outcome: Progressing Goal: Respiratory complications will improve 05/08/2019 1156 by Arlina Robes, RN Outcome: Progressing 05/08/2019 1155 by Arlina Robes, RN Outcome: Progressing Goal: Cardiovascular complication will be avoided 05/08/2019 1156 by Arlina Robes, RN Outcome: Progressing 05/08/2019 1155 by Arlina Robes, RN Outcome: Progressing   Problem: Clinical Measurements: Goal: Will remain free from infection 05/08/2019 1156 by Arlina Robes, RN Outcome: Progressing 05/08/2019 1155 by Arlina Robes, RN Outcome: Progressing   Problem: Clinical Measurements: Goal: Diagnostic test results will improve 05/08/2019 1156 by Arlina Robes, RN Outcome: Progressing 05/08/2019 1155 by Arlina Robes, RN Outcome: Progressing   Problem:  Cardiac: Goal: Ability to achieve and maintain adequate cardiopulmonary perfusion will improve 05/08/2019 1156 by Arlina Robes, RN Outcome: Progressing 05/08/2019 1155 by Arlina Robes, RN Outcome: Progressing   Problem: Activity: Goal: Capacity to carry out activities will improve 05/08/2019 1156 by Arlina Robes, RN Outcome: Progressing 05/08/2019 1155 by Arlina Robes, RN Outcome: Progressing   Problem: Education: Goal: Individualized Educational Video(s) 05/08/2019 1156 by Arlina Robes, RN Outcome: Progressing 05/08/2019 1155 by Arlina Robes, RN Outcome: Progressing   Problem: Education: Goal: Ability to verbalize understanding of medication therapies will improve 05/08/2019 1156 by Arlina Robes, RN Outcome: Progressing 05/08/2019 1155 by Arlina Robes, RN Outcome: Progressing   Problem: Education: Goal: Individualized Educational Video(s) 05/08/2019 1156 by Arlina Robes, RN Outcome: Progressing 05/08/2019 1155 by Arlina Robes, RN Outcome: Progressing   Problem: Education: Goal: Ability to verbalize understanding of medication therapies will improve 05/08/2019 1156 by Arlina Robes, RN Outcome: Progressing 05/08/2019 1155 by Arlina Robes, RN Outcome: Progressing   Problem: Education: Goal: Ability to demonstrate management of disease process will improve 05/08/2019 1156 by Arlina Robes, RN Outcome: Progressing 05/08/2019 1155 by Arlina Robes, RN Outcome: Progressing Goal: Ability to verbalize understanding of medication therapies will improve 05/08/2019 1156 by Arlina Robes, RN Outcome: Progressing 05/08/2019 1155 by Arlina Robes, RN Outcome: Progressing Goal: Individualized Educational Video(s) 05/08/2019 1156 by Arlina Robes, RN Outcome: Progressing 05/08/2019 1155 by Arlina Robes, RN Outcome: Progressing   Problem: Skin Integrity: Goal: Risk for  impaired skin integrity will decrease 05/08/2019 1156 by Arlina Robes, RN Outcome: Progressing 05/08/2019 1155 by Arlina Robes, RN Outcome: Progressing   Problem: Skin Integrity: Goal: Risk for impaired skin integrity will decrease 05/08/2019 1156 by Arlina Robes, RN Outcome: Progressing 05/08/2019 1155 by Arlina Robes, RN Outcome: Progressing   Problem: Safety: Goal: Ability to remain free from injury will improve 05/08/2019 1156 by Wynetta Emery,  Vertis Kelch, RN Outcome: Progressing 05/08/2019 1155 by Arlina Robes, RN Outcome: Progressing   Problem: Pain Managment: Goal: General experience of comfort will improve 05/08/2019 1156 by Arlina Robes, RN Outcome: Progressing 05/08/2019 1155 by Arlina Robes, RN Outcome: Progressing   Problem: Elimination: Goal: Will not experience complications related to urinary retention 05/08/2019 1156 by Arlina Robes, RN Outcome: Progressing 05/08/2019 1155 by Arlina Robes, RN Outcome: Progressing   Problem: Elimination: Goal: Will not experience complications related to bowel motility 05/08/2019 1156 by Arlina Robes, RN Outcome: Progressing 05/08/2019 1155 by Arlina Robes, RN Outcome: Progressing Goal: Will not experience complications related to urinary retention 05/08/2019 1156 by Arlina Robes, RN Outcome: Progressing 05/08/2019 1155 by Arlina Robes, RN Outcome: Progressing   Problem: Coping: Goal: Level of anxiety will decrease 05/08/2019 1156 by Arlina Robes, RN Outcome: Progressing 05/08/2019 1155 by Arlina Robes, RN Outcome: Progressing   Problem: Coping: Goal: Level of anxiety will decrease 05/08/2019 1156 by Arlina Robes, RN Outcome: Progressing 05/08/2019 1155 by Arlina Robes, RN Outcome: Progressing   Problem: Nutrition: Goal: Adequate nutrition will be maintained 05/08/2019 1156 by Arlina Robes, RN Outcome:  Progressing 05/08/2019 1155 by Arlina Robes, RN Outcome: Progressing   Problem: Activity: Goal: Risk for activity intolerance will decrease 05/08/2019 1156 by Arlina Robes, RN Outcome: Progressing 05/08/2019 1155 by Arlina Robes, RN Outcome: Progressing   Problem: Activity: Goal: Risk for activity intolerance will decrease 05/08/2019 1156 by Arlina Robes, RN Outcome: Progressing 05/08/2019 1155 by Arlina Robes, RN Outcome: Progressing   Problem: Clinical Measurements: Goal: Cardiovascular complication will be avoided 05/08/2019 1156 by Arlina Robes, RN Outcome: Progressing 05/08/2019 1155 by Arlina Robes, RN Outcome: Progressing   Problem: Clinical Measurements: Goal: Respiratory complications will improve 05/08/2019 1156 by Arlina Robes, RN Outcome: Progressing 05/08/2019 1155 by Arlina Robes, RN Outcome: Progressing   Problem: Clinical Measurements: Goal: Diagnostic test results will improve 05/08/2019 1156 by Arlina Robes, RN Outcome: Progressing 05/08/2019 1155 by Arlina Robes, RN Outcome: Progressing   Problem: Clinical Measurements: Goal: Will remain free from infection 05/08/2019 1156 by Arlina Robes, RN Outcome: Progressing 05/08/2019 1155 by Arlina Robes, RN Outcome: Progressing   Problem: Clinical Measurements: Goal: Diagnostic test results will improve 05/08/2019 1156 by Arlina Robes, RN Outcome: Progressing 05/08/2019 1155 by Arlina Robes, RN Outcome: Progressing   Problem: Clinical Measurements: Goal: Will remain free from infection 05/08/2019 1156 by Arlina Robes, RN Outcome: Progressing 05/08/2019 1155 by Arlina Robes, RN Outcome: Progressing   Problem: Clinical Measurements: Goal: Ability to maintain clinical measurements within normal limits will improve 05/08/2019 1156 by Arlina Robes, RN Outcome: Progressing 05/08/2019 1155 by  Arlina Robes, RN Outcome: Progressing Goal: Will remain free from infection 05/08/2019 1156 by Arlina Robes, RN Outcome: Progressing 05/08/2019 1155 by Arlina Robes, RN Outcome: Progressing Goal: Diagnostic test results will improve 05/08/2019 1156 by Arlina Robes, RN Outcome: Progressing 05/08/2019 1155 by Arlina Robes, RN Outcome: Progressing Goal: Respiratory complications will improve 05/08/2019 1156 by Arlina Robes, RN Outcome: Progressing 05/08/2019 1155 by Arlina Robes, RN Outcome: Progressing Goal: Cardiovascular complication will be avoided 05/08/2019 1156 by Arlina Robes, RN Outcome: Progressing 05/08/2019 1155 by Arlina Robes, RN Outcome: Progressing   Problem: Health Behavior/Discharge Planning: Goal: Ability to manage health-related needs will improve 05/08/2019 1156 by Arlina Robes, RN  Outcome: Progressing 05/08/2019 1155 by Arlina Robes, RN Outcome: Progressing   Problem: Education: Goal: Knowledge of General Education information will improve Description: Including pain rating scale, medication(s)/side effects and non-pharmacologic comfort measures 05/08/2019 1156 by Arlina Robes, RN Outcome: Progressing 05/08/2019 1155 by Arlina Robes, RN Outcome: Progressing

## 2019-05-09 LAB — BASIC METABOLIC PANEL
Anion gap: 10 (ref 5–15)
BUN: 25 mg/dL — ABNORMAL HIGH (ref 8–23)
CO2: 27 mmol/L (ref 22–32)
Calcium: 8.7 mg/dL — ABNORMAL LOW (ref 8.9–10.3)
Chloride: 102 mmol/L (ref 98–111)
Creatinine, Ser: 1.97 mg/dL — ABNORMAL HIGH (ref 0.44–1.00)
GFR calc Af Amer: 28 mL/min — ABNORMAL LOW (ref >60)
GFR calc non Af Amer: 24 mL/min — ABNORMAL LOW (ref >60)
Glucose, Bld: 90 mg/dL (ref 70–99)
Potassium: 4.4 mmol/L (ref 3.5–5.1)
Sodium: 139 mmol/L (ref 135–145)

## 2019-05-09 MED ORDER — SENNOSIDES-DOCUSATE SODIUM 8.6-50 MG PO TABS
1.0000 | ORAL_TABLET | Freq: Two times a day (BID) | ORAL | Status: DC
  Administered 2019-05-09 – 2019-05-14 (×9): 1 via ORAL
  Filled 2019-05-09 (×10): qty 1

## 2019-05-09 MED ORDER — BISACODYL 5 MG PO TBEC: 5.0000 mg | DELAYED_RELEASE_TABLET | Freq: Every day | ORAL | Status: DC | PRN

## 2019-05-09 MED ORDER — FUROSEMIDE 40 MG PO TABS
40.0000 mg | ORAL_TABLET | Freq: Every day | ORAL | Status: DC
  Administered 2019-05-10 – 2019-05-14 (×5): 40 mg via ORAL
  Filled 2019-05-09 (×5): qty 1

## 2019-05-09 NOTE — TOC Progression Note (Addendum)
Transition of Care [facility]) - Progression Note    Patient Details  Name: Stephanie Yates MRN: [id number] Date of Birth: 06-Nov-1945  Transition of Care [facility]) CM/SW Contact  Zenon Mayo, RN Phone Number: 05/09/2019, 4:59 PM  Clinical Narrative:    NCM spoke with patient ,offered choice for [facility], HHPT, patient states she has [facility] services now but not sure with who.  NCM asked if she has palliative services also , she states she is not sure.  NCM checked with [facility], niether has her as a patient.  Patient does not know who she has at this point. NCM will continue to check on.  Looks like she was with Alvis Lemmings , NCM contacted [facility] with Alvis Lemmings for [facility] [place].  Patient states she did not have palliative services.        Expected Discharge Plan and Services                                                 Social Determinants of Health (SDOH) Interventions    Readmission Risk Interventions Readmission Risk Prevention Plan 01/24/2019 01/08/2019 09/05/2018  Transportation Screening Complete - Complete  PCP or Specialist Appt within 3-5 Days Complete - Complete  HRI or Home Care Consult Complete Patient refused Complete  Social Work Consult for [place] Planning/Counseling Complete - Complete  Palliative Care Screening Not Applicable Not Applicable Not Applicable  Medication Review Press photographer) Complete - Not Complete  Med Review Comments - - pt not ready for dc/ rn to complete when pt is ready for dc  Some recent data might be hidden

## 2019-05-09 NOTE — Plan of Care (Signed)
Problem: Education: Goal: Knowledge of General Education information will improve Description: Including pain rating scale, medication(s)/side effects and non-pharmacologic comfort measures 05/09/2019 1149 by Arlina Robes, RN Outcome: Progressing 05/09/2019 1149 by Arlina Robes, RN Outcome: Progressing   Problem: Health Behavior/Discharge Planning: Goal: Ability to manage health-related needs will improve 05/09/2019 1149 by Arlina Robes, RN Outcome: Progressing 05/09/2019 1149 by Arlina Robes, RN Outcome: Progressing   Problem: Clinical Measurements: Goal: Ability to maintain clinical measurements within normal limits will improve 05/09/2019 1149 by Arlina Robes, RN Outcome: Progressing 05/09/2019 1149 by Arlina Robes, RN Outcome: Progressing Goal: Will remain free from infection 05/09/2019 1149 by Arlina Robes, RN Outcome: Progressing 05/09/2019 1149 by Arlina Robes, RN Outcome: Progressing Goal: Diagnostic test results will improve 05/09/2019 1149 by Arlina Robes, RN Outcome: Progressing 05/09/2019 1149 by Arlina Robes, RN Outcome: Progressing Goal: Respiratory complications will improve 05/09/2019 1149 by Arlina Robes, RN Outcome: Progressing 05/09/2019 1149 by Arlina Robes, RN Outcome: Progressing Goal: Cardiovascular complication will be avoided 05/09/2019 1149 by Arlina Robes, RN Outcome: Progressing 05/09/2019 1149 by Arlina Robes, RN Outcome: Progressing   Problem: Activity: Goal: Risk for activity intolerance will decrease 05/09/2019 1149 by Arlina Robes, RN Outcome: Progressing 05/09/2019 1149 by Arlina Robes, RN Outcome: Progressing   Problem: Nutrition: Goal: Adequate nutrition will be maintained 05/09/2019 1149 by Arlina Robes, RN Outcome: Progressing 05/09/2019 1149 by Arlina Robes, RN Outcome: Progressing   Problem: Coping: Goal: Level  of anxiety will decrease 05/09/2019 1149 by Arlina Robes, RN Outcome: Progressing 05/09/2019 1149 by Arlina Robes, RN Outcome: Progressing   Problem: Elimination: Goal: Will not experience complications related to bowel motility 05/09/2019 1149 by Arlina Robes, RN Outcome: Progressing 05/09/2019 1149 by Arlina Robes, RN Outcome: Progressing Goal: Will not experience complications related to urinary retention 05/09/2019 1149 by Arlina Robes, RN Outcome: Progressing 05/09/2019 1149 by Arlina Robes, RN Outcome: Progressing   Problem: Coping: Goal: Level of anxiety will decrease 05/09/2019 1149 by Arlina Robes, RN Outcome: Progressing 05/09/2019 1149 by Arlina Robes, RN Outcome: Progressing   Problem: Elimination: Goal: Will not experience complications related to bowel motility 05/09/2019 1149 by Arlina Robes, RN Outcome: Progressing 05/09/2019 1149 by Arlina Robes, RN Outcome: Progressing Goal: Will not experience complications related to urinary retention 05/09/2019 1149 by Arlina Robes, RN Outcome: Progressing 05/09/2019 1149 by Arlina Robes, RN Outcome: Progressing   Problem: Pain Managment: Goal: General experience of comfort will improve 05/09/2019 1149 by Arlina Robes, RN Outcome: Progressing 05/09/2019 1149 by Arlina Robes, RN Outcome: Progressing   Problem: Safety: Goal: Ability to remain free from injury will improve 05/09/2019 1149 by Arlina Robes, RN Outcome: Progressing 05/09/2019 1149 by Arlina Robes, RN Outcome: Progressing   Problem: Education: Goal: Ability to demonstrate management of disease process will improve 05/09/2019 1149 by Arlina Robes, RN Outcome: Progressing 05/09/2019 1149 by Arlina Robes, RN Outcome: Progressing Goal: Ability to verbalize understanding of medication therapies will improve 05/09/2019 1149 by Arlina Robes, RN Outcome: Progressing 05/09/2019 1149 by Arlina Robes, RN Outcome: Progressing Goal: Individualized Educational Video(s) 05/09/2019 1149 by Arlina Robes, RN Outcome: Progressing 05/09/2019 1149 by Arlina Robes, RN Outcome: Progressing   Problem: Skin Integrity: Goal: Risk for impaired skin integrity will decrease 05/09/2019 1149 by Arlina Robes, RN Outcome: Progressing 05/09/2019 1149 by Wynetta Emery,  Vertis Kelch, RN Outcome: Progressing   Problem: Safety: Goal: Ability to remain free from injury will improve 05/09/2019 1149 by Arlina Robes, RN Outcome: Progressing 05/09/2019 1149 by Arlina Robes, RN Outcome: Progressing   Problem: Activity: Goal: Capacity to carry out activities will improve 05/09/2019 1149 by Arlina Robes, RN Outcome: Progressing 05/09/2019 1149 by Arlina Robes, RN Outcome: Progressing   Problem: Cardiac: Goal: Ability to achieve and maintain adequate cardiopulmonary perfusion will improve 05/09/2019 1149 by Arlina Robes, RN Outcome: Progressing 05/09/2019 1149 by Arlina Robes, RN Outcome: Progressing

## 2019-05-09 NOTE — Progress Notes (Signed)
[facility] PROGRESS NOTE  Stephanie Yates FTD:[id number] DOB: May 21, 1946 DOA: 05/05/2019 PCP: Josetta Huddle, MD  Assessment/Plan:  #1. Acute on chronic combined congestive heart failure. EF 15%.  BNP elevated. Chest xray with mild central venous congestion. Provided with IV lasix. Improved with lasix. Chart review indicated weight up from last admission to [place] from [place]. Of note, patient lives alone with family nearby, monthly hospitalizaitons since July.  -Discontinue IV Lasix and switch to p.o. Lasix starting tomorrow -continue home amiodarone, coreg,  -monitor electrolytes and kidney function -intake and output -daily weight -OP follow up with cards -palliative care for goals of care discussion is ongoing  #2. Hypokalemia -Monitor  #3. CKD III. Creatinine at baseline -monitor closely given need for lasix  #4. Hypertension. Fair control. Home meds include  -continue home meds -monitor  #5. PAF. Currently sr. Home meds include eliquis and BB as well as mexitil.  -continue home meds -monitor  #6. Non-ischemic cardiomyopathy. See #1. No chest pain. HS trop 24 and 26. ekg with sr and LBBB -see above  Code Status: full  Disposition Plan: home with home health for physical therapy; hopefully tomorrow   Consultants:  palliative   HPI/Subjective: Patient states her nausea is a little improved today but did have some nausea this morning.  She endorses some constipation but states her breathing is improved and is currently on room air.  She is open to the idea of potentially being discharged home tomorrow.  Objective: Vitals:   05/09/19 0700 05/09/19 1100  BP: (!) 126/59 (!) 120/54  Pulse: 60 (!) 59  Resp: 16 16  Temp: 99 F (37.2 C) 98.1 F (36.7 C)  SpO2: 99% 99%    Intake/Output Summary (Last 24 hours) at 05/09/2019 1550 Last data filed at 05/09/2019 1530 Gross per 24 hour  Intake 1222 ml  Output 2675 ml  Net -1453 ml   Filed Weights   05/07/19 0711  05/08/19 0530 05/09/19 0500  Weight: 61.2 kg 60.7 kg 59.2 kg    Exam:   General:  Awake alert no acute distress  Cardiovascular: rrr no mgr trace LE edema  Respiratory: normal effort, clear to auscultation bilaterally  Abdomen: Softer than yesterday, +BS, no guarding or rebounding  Musculoskeletal: joints without swelling/erythema    Principal Problem:   Acute on chronic combined systolic and diastolic CHF (congestive heart failure) (HCC) Active Problems:   Hypertension   Nonischemic cardiomyopathy (HCC)   Hypokalemia   PAF (paroxysmal atrial fibrillation) ([place])   ICD (implantable cardioverter-defibrillator), dual, [place]   HLD (hyperlipidemia)   Chronic anticoagulation   DNR (do not resuscitate) discussion   CKD (chronic kidney disease) stage 4, GFR 15-29 ml/min (HCC)   Gallbladder mass   Volume overload   Palliative care by specialist    Time spent: 25 minutes    Charolotte Capuchin MD [facility]  If 7PM-7AM, please contact night-coverage at [email or web address], password [facility] 05/09/2019, 3:50 PM  LOS: 3 days

## 2019-05-10 LAB — BASIC METABOLIC PANEL
Anion gap: 8 (ref 5–15)
BUN: 28 mg/dL — ABNORMAL HIGH (ref 8–23)
CO2: 27 mmol/L (ref 22–32)
Calcium: 8.5 mg/dL — ABNORMAL LOW (ref 8.9–10.3)
Chloride: 101 mmol/L (ref 98–111)
Creatinine, Ser: 1.98 mg/dL — ABNORMAL HIGH (ref 0.44–1.00)
GFR calc Af Amer: 28 mL/min — ABNORMAL LOW (ref >60)
GFR calc non Af Amer: 24 mL/min — ABNORMAL LOW (ref >60)
Glucose, Bld: 89 mg/dL (ref 70–99)
Potassium: 4.2 mmol/L (ref 3.5–5.1)
Sodium: 136 mmol/L (ref 135–145)

## 2019-05-10 MED ORDER — SENNOSIDES-DOCUSATE SODIUM 8.6-50 MG PO TABS: 1.0000 | ORAL_TABLET | Freq: Every evening | ORAL | 0 refills | Status: DC | PRN

## 2019-05-10 MED ORDER — FUROSEMIDE 10 MG/ML IJ SOLN
20.0000 mg | Freq: Once | INTRAMUSCULAR | Status: AC
  Administered 2019-05-10: 20 mg via INTRAVENOUS
  Filled 2019-05-10: qty 2

## 2019-05-10 NOTE — Care Management Important Message (Signed)
Important Message  Patient Details  Name: Stephanie Yates MRN: [id number] Date of Birth: [birth year]   Medicare Important Message Given:  Yes     Shelda Altes 05/10/2019, 2:45 PM

## 2019-05-10 NOTE — Progress Notes (Signed)
PT Cancellation Note  Patient Details Name: Stephanie Yates MRN: [id number] DOB: [birth year]   Cancelled Treatment:    Reason Eval/Treat Not Completed: Patient declined, no reason specified;Pain limiting ability to participate Pt just finished eating breakfast and declines working with PT at this time reporting discomfort in her belly. Will follow.   Marguarite Arbour A Jennylee Uehara 05/10/2019, 8:02 AM Marisa Severin, PT, DPT Acute Rehabilitation Services Pager [phone] Office [phone]

## 2019-05-10 NOTE — TOC Transition Note (Addendum)
Transition of Care [facility]) - CM/SW Discharge Note   Patient Details  Name: Stephanie Yates MRN: [id number] Date of Birth: [birth year]  Transition of Care [facility]) CM/SW Contact:  Zenon Mayo, RN Phone Number: 05/10/2019, 1:16 PM   Clinical Narrative:    Patient was with [facility] and she states she would like to continue with [facility], NCM notified [facility] with [place].  Awaiting to her back from [place]. It is actually [facility] that she is active with and would like to continue.  NCM contacted Cassie with [facility] and she states they will resume [place] services.   Final next level of care: [place] Barriers to Discharge: No Barriers Identified   Patient Goals and CMS Choice Patient states their goals for this hospitalization and ongoing recovery are:: go home CMS Medicare.gov Compare Post Acute Care list provided to:: Patient Choice offered to / list presented to : Patient  Discharge Placement                       Discharge Plan and Services                DME Arranged: (NA)         HH Arranged: RN, PT HH Agency: [facility], [facility] Date [place]: 05/10/19 Time [place]: [id number] Representative spoke with at [place]: [place] ([place]) Interventions     Readmission Risk Interventions Readmission Risk Prevention Plan 01/24/2019 01/08/2019 09/05/2018  Transportation Screening Complete - Complete  PCP or Specialist Appt within 3-5 Days Complete - Complete  HRI or Home Care Consult Complete Patient refused Complete  Social Work Consult for [place] Planning/Counseling Complete - Complete  Palliative Care Screening Not Applicable Not Applicable Not Applicable  Medication Review Press photographer) Complete - Not Complete  Med Review Comments - - pt not ready for dc/ rn to complete when pt is ready for dc  Some recent data might be hidden

## 2019-05-10 NOTE — Progress Notes (Signed)
[facility] PROGRESS NOTE  Stephanie Yates WLN:[id number] DOB: 1946/01/04 DOA: 05/05/2019 PCP: Josetta Huddle, MD  Assessment/Plan:  #1. Acute on chronic combined congestive heart failure. EF 15%.  BNP elevated. Chest xray with mild central venous congestion. Provided with IV lasix. Improved with lasix. Chart review indicated weight up from last admission to [place] from [place]. Of note, patient lives alone with family nearby, monthly hospitalizaitons since July.  -Discontinue IV Lasix and switch to p.o. Lasix starting today -continue home amiodarone, coreg,  -monitor electrolytes and kidney function -intake and output -daily weight -OP follow up with cards -palliative care for goals of care discussion is ongoing  #2. Hypokalemia -Monitor  #3. CKD III. Creatinine at baseline -monitor closely given need for lasix  #4. Hypertension. Fair control. Home meds include  -continue home meds -monitor  #5. PAF. Currently sr. Home meds include eliquis and BB as well as mexitil.  -continue home meds -monitor  #6. Non-ischemic cardiomyopathy. See #1. No chest pain. HS trop 24 and 26. ekg with sr and LBBB -see above  Code Status: full  Disposition Plan: home with home health for physical therapy; hopefully tomorrow   Consultants:  palliative   HPI/Subjective: Patient still feels a little dyspnea on exertion and endorses mild constipation.  Objective: Vitals:   05/10/19 0758 05/10/19 1109  BP: 106/61 (!) 126/56  Pulse: 60 61  Resp: 20 18  Temp: 97.9 F (36.6 C) 98.1 F (36.7 C)  SpO2: 93% 93%    Intake/Output Summary (Last 24 hours) at 05/10/2019 1425 Last data filed at 05/10/2019 1330 Gross per 24 hour  Intake 720 ml  Output 1350 ml  Net -630 ml   Filed Weights   05/08/19 0530 05/09/19 0500 05/10/19 0600  Weight: 60.7 kg 59.2 kg 60.8 kg    Exam:   General:  Awake alert no acute distress  Cardiovascular: rrr no mgr trace LE edema  Respiratory: normal  effort, clear to auscultation bilaterally  Abdomen: Softer than yesterday, +BS, no guarding or rebounding  Musculoskeletal: joints without swelling/erythema    Principal Problem:   Acute on chronic combined systolic and diastolic CHF (congestive heart failure) (HCC) Active Problems:   Hypertension   Nonischemic cardiomyopathy (HCC)   Hypokalemia   PAF (paroxysmal atrial fibrillation) ([place])   ICD (implantable cardioverter-defibrillator), dual, [facility]   HLD (hyperlipidemia)   Chronic anticoagulation   DNR (do not resuscitate) discussion   CKD (chronic kidney disease) stage 4, GFR 15-29 ml/min (HCC)   Gallbladder mass   Volume overload   Palliative care by specialist    Time spent: 25 minutes    Charolotte Capuchin MD [facility]  If 7PM-7AM, please contact night-coverage at [email or web address], password [facility] 05/10/2019, 2:25 PM  LOS: 4 days

## 2019-05-10 NOTE — TOC Initial Note (Signed)
Transition of Care [facility] - Initial/Assessment Note    Patient Details  Name: Stephanie Yates MRN: [id number] Date of Birth: 1945-11-06  Transition of Care [facility]) CM/SW Contact:    Zenon Mayo, RN Phone Number: 05/10/2019, 1:41 PM  Clinical Narrative:                 Patient for dc today, she is active with [facility] for RN, PT, NCM  Informed patient and she would like to continue with [facility]. Cassie notified.  They will resume [place] services.  Expected Discharge Plan: [place] Barriers to Discharge: No Barriers Identified   Patient Goals and CMS Choice Patient states their goals for this hospitalization and ongoing recovery are:: go home CMS Medicare.gov Compare Post Acute Care list provided to:: Patient Choice offered to / list presented to : Patient  Expected Discharge Plan and Services Expected Discharge Plan: [place]   Discharge Planning Services: CM Consult Post Acute Care Choice: [place] arrangements for the past 2 months: Single Family Home Expected Discharge Date: 05/10/19               DME Arranged: (NA)         HH Arranged: RN, PT HH Agency: [facility] Date [place]: 05/10/19 Time HH Agency Contacted: 33 Representative spoke with at [place] Arrangements/Services Living arrangements for the past 2 months: [place] with:: Self Patient language and need for interpreter reviewed:: Yes Do you feel safe going back to the place where you live?: Yes      Need for Family Participation in Patient Care: No (Comment) Care giver support system in place?: No (comment) Current home services: Home PT, Home RN Criminal Activity/Legal Involvement Pertinent to Current Situation/Hospitalization: No - Comment as needed  Activities of Daily Living Home Assistive Devices/Equipment: Bathtub lift ADL Screening (condition at time of admission) Patient's  cognitive ability adequate to safely complete daily activities?: Yes Is the patient deaf or have difficulty hearing?: No Does the patient have difficulty seeing, even when wearing glasses/contacts?: Yes Does the patient have difficulty concentrating, remembering, or making decisions?: No Patient able to express need for assistance with ADLs?: No Does the patient have difficulty dressing or bathing?: No Independently performs ADLs?: Yes (appropriate for developmental age) Communication: Independent Dressing (OT): Independent Grooming: Independent Feeding: Independent Bathing: Independent Toileting: Independent In/Out Bed: Independent Walks in Home: Independent Does the patient have difficulty walking or climbing stairs?: No Weakness of Legs: Left Weakness of Arms/Hands: None  Permission Sought/Granted                  Emotional Assessment Appearance:: Appears stated age Attitude/Demeanor/Rapport: Engaged Affect (typically observed): Appropriate Orientation: : Oriented to Self, Oriented to Place, Oriented to  Time, Oriented to Situation Alcohol / Substance Use: Not Applicable Psych Involvement: No (comment)  Admission diagnosis:  edema Patient Active Problem List   Diagnosis Date Noted  . Palliative care by specialist   . Gallbladder mass 05/06/2019  . Volume overload 05/06/2019  . Vertigo 04/15/2019  . Abnormal liver function 04/15/2019  . Right adrenal mass ([place]) possible adenoma 03/18/2019  . Nephrolithiasis 03/18/2019  . Prolonged QT interval 03/17/2019  . Renal failure (ARF), acute on chronic ([place]) 03/17/2019  . Nausea and vomiting 03/17/2019  . Epigastric abdominal pain 01/29/2019  . Gastritis 01/28/2019  . CKD (chronic kidney disease) stage 4, GFR 15-29 ml/min (HCC) 01/28/2019  .  VT (ventricular tachycardia) ([place]) 01/17/2019  . AKI (acute kidney injury) ([place]) 01/12/2019  . DNR (do not resuscitate) discussion   . CHF exacerbation ([place]) 01/06/2019  .  Influenza A 09/02/2018  . Lactic acidosis 09/02/2018  . Chronic combined systolic and diastolic congestive heart failure ([place]) 09/02/2018  . Rapid atrial fibrillation ([place]) 06/08/2018  . Acute on chronic combined systolic and diastolic heart failure ([place]) 06/07/2018  . Anemia 06/07/2018  . Acute GI bleeding 06/15/2017  . Chronic anticoagulation   . Acute on chronic congestive heart failure ([place])   . HLD (hyperlipidemia) 03/05/2017  . CKD (chronic kidney disease), stage III 03/05/2017  . Chest pain 03/05/2017  . Symptomatic anemia 03/05/2017  . PAF (paroxysmal atrial fibrillation) ([place]) 04/01/2013  . ICD (implantable cardioverter-defibrillator), dual, [facility] 04/01/2013  . PVC's (premature ventricular contractions) 04/01/2013  . Hypokalemia 12/21/2012  . History of tobacco abuse 12/21/2012  . Transaminitis 12/21/2012  . Sinus bradycardia 12/21/2012  . Hyperkalemia 12/20/2012  . Nonischemic cardiomyopathy ([place]) 12/20/2012  . UTI (urinary tract infection) 12/20/2012  . Acute on chronic combined systolic and diastolic CHF (congestive heart failure) ([place]) 12/20/2012  . Ventricular tachycardia ([place]) 12/14/2012  . Hypertension 12/14/2012   PCP:  Josetta Huddle, MD Pharmacy:   [facility] [place], [place] - [place] [address] [place] [address] Phone: [phone] Fax: [phone]     Social Determinants of Health (SDOH) Interventions    Readmission Risk Interventions Readmission Risk Prevention Plan 05/10/2019 01/24/2019 01/08/2019  Transportation Screening Complete Complete -  PCP or Specialist Appt within 3-5 Days - Complete -  HRI or [place] - Complete Patient refused  Social Work Consult for [place] Planning/Counseling - Complete -  Palliative Care Screening - Not Applicable Not Applicable  Medication Review Press photographer) Complete Complete -  Med Review Comments - - -  PCP  or Specialist appointment within 3-5 days of discharge Complete - -  [place] or Home Care Consult Complete - -  SW Recovery Care/Counseling Consult Complete - -  Palliative Care Screening Not Applicable - -  [place] Not Applicable - -  Some recent data might be hidden

## 2019-05-10 NOTE — Progress Notes (Addendum)
Physical Therapy Treatment Patient Details Name: Stephanie Yates MRN: [id number] DOB: [birth year] Today's Date: 05/10/2019    History of Present Illness 73 year old female admitted with B LE swelling, SOB. Patient with CHF, HTN, Afib, HLD, CKD. frequent admissions for same.    PT Comments    Patient reports feeling weaker today, as she did not walk yesterday. Also noted to have a drop in Sp02 with mobility to 87% on RA, with reports of feeling "swimmy headed." Requires Min A for gait training with use of RW which pt normally does not use at baseline. Fatigues quickly today. Pt reports not feeling safe to return home today as she lives alone and does not have support. Also needs to be able to negotiate stairs to get into home, which she is not able to do today. Encouraged walking with nursing a few more times today to improve overall strength, endurance and assess possible need for 02. RN aware of regression in mobility. Will follow.     Follow Up Recommendations  Home health PT;Other (comment)(HHRN for CHF management)     Equipment Recommendations  None recommended by PT    Recommendations for Other Services       Precautions / Restrictions Precautions Precautions: Fall Precaution Comments: watch 02 Restrictions Weight Bearing Restrictions: No    Mobility  Bed Mobility Overal bed mobility: Modified Independent Bed Mobility: Supine to Sit;Sit to Supine     Supine to sit: Modified independent (Device/Increase time);HOB elevated Sit to supine: Modified independent (Device/Increase time);HOB elevated   General bed mobility comments: increased time/effort; use of rail  Transfers Overall transfer level: Needs assistance Equipment used: Rolling walker (2 wheeled) Transfers: Sit to/from Stand Sit to Stand: Min guard         General transfer comment: Min guard for safety. Stood from EOB with some difficulty.  Ambulation/Gait Ambulation/Gait assistance: Min assist Gait  Distance (Feet): 50 Feet Assistive device: Rolling walker (2 wheeled) Gait Pattern/deviations: Step-through pattern;Decreased stride length;Trunk flexed;Drifts right/left;Decreased stance time - right;Decreased step length - left Gait velocity: decreased   General Gait Details: Slow, unsteady gait with difficulty progressing RLE; BLE weakness and heavy reliance on BUEs on RW. A few standing rest breaks. Sp02 dropped to 87% on RA, reports feeling swimmy headed. Agreeable to using RW today.   Stairs             Wheelchair Mobility    Modified Rankin (Stroke Patients Only)       Balance Overall balance assessment: Needs assistance Sitting-balance support: Feet supported;No upper extremity supported Sitting balance-Leahy Scale: Good Sitting balance - Comments: Not able to bring feet up to donn socks, able to doff socks with effort.   Standing balance support: During functional activity Standing balance-Leahy Scale: Poor Standing balance comment: Requires UE support in standing and for dynamic tasks.                            Cognition Arousal/Alertness: Awake/alert Behavior During Therapy: WFL for tasks assessed/performed Overall Cognitive Status: No family/caregiver present to determine baseline cognitive functioning                                 General Comments: Willing to use RW at this time- better awareness if safety.      Exercises      General Comments General comments (skin integrity, edema, etc.): Sp02 ranged  from 87-90% on RA; donned 2L/min 02. RN aware of need for 02 and worsened weakness today.      Pertinent Vitals/Pain Pain Assessment: Faces Faces Pain Scale: Hurts little more Pain Location: right abdomen Pain Descriptors / Indicators: Sore;Discomfort;Grimacing Pain Intervention(s): Monitored during session;Limited activity within patient's tolerance    Home Living                      Prior Function             PT Goals (current goals can now be found in the care plan section) Progress towards PT goals: Not progressing toward goals - comment(due to weakness/fatigue)    Frequency    Min 3X/week      PT Plan Current plan remains appropriate    Co-evaluation              AM-PAC PT "6 Clicks" Mobility   Outcome Measure  Help needed turning from your back to your side while in a flat bed without using bedrails?: None Help needed moving from lying on your back to sitting on the side of a flat bed without using bedrails?: A Little Help needed moving to and from a bed to a chair (including a wheelchair)?: A Little Help needed standing up from a chair using your arms (e.g., wheelchair or bedside chair)?: A Little Help needed to walk in hospital room?: A Little Help needed climbing 3-5 steps with a railing? : A Lot 6 Click Score: 18    End of Session Equipment Utilized During Treatment: Gait belt Activity Tolerance: Patient limited by fatigue Patient left: in bed;with call bell/phone within reach;with bed alarm set Nurse Communication: Mobility status PT Visit Diagnosis: Muscle weakness (generalized) (M62.81);Pain;Difficulty in walking, not elsewhere classified (R26.2) Pain - part of body: (abdomen)     Time: 4373-5789 PT Time Calculation (min) (ACUTE ONLY): 18 min  Charges:  $Gait Training: 8-22 mins                     Marisa Severin, PT, DPT Acute Rehabilitation Services Pager [phone] Office [place] 05/10/2019, 2:13 PM

## 2019-05-11 DIAGNOSIS — I5043 Acute on chronic combined systolic (congestive) and diastolic (congestive) heart failure: Secondary | ICD-10-CM

## 2019-05-11 NOTE — Progress Notes (Signed)
Physical Therapy Treatment Patient Details Name: Stephanie Yates MRN: [id number] DOB: 1945-08-05 Today's Date: 05/11/2019    History of Present Illness Pt is a 73 year old female admitted 05/05/19 with B LE swelling, SOB. Patient with CHF, HTN, Afib, HLD, CKD. Of note, many recent admissions for same.   PT Comments    Pt progressing with mobility. Able to transfer and ambulate with RW at supervision-level, declined stair training; pt remains limited by generalized weakness and decreased activity tolerance. SpO2 91% on RA with ambulation. Reinforced educ re: fall risk reduction (recommendation for use of RW at home), importance of mobility. Continue to recommend HHPT services.    Follow Up Recommendations  Home health PT;Supervision for mobility/OOB(HHRN for CHF management)     Equipment Recommendations  None recommended by PT    Recommendations for Other Services       Precautions / Restrictions Precautions Precautions: Fall Restrictions Weight Bearing Restrictions: No    Mobility  Bed Mobility               General bed mobility comments: Received sitting in recliner  Transfers Overall transfer level: Modified independent Equipment used: Rolling walker (2 wheeled) Transfers: Sit to/from Stand           General transfer comment: Mod indep standing from recliner, reliant on UE support  Ambulation/Gait Ambulation/Gait assistance: Supervision Gait Distance (Feet): 120 Feet Assistive device: Rolling walker (2 wheeled) Gait Pattern/deviations: Step-through pattern;Decreased stride length;Trunk flexed;Antalgic Gait velocity: Decreased Gait velocity interpretation: <1.31 ft/sec, indicative of household ambulator General Gait Details: Slow, fatigued gait with RW and supervision for safety. SpO2 95% ambulating on 2L, 91% ambulating on RA   Stairs Stairs: (Pt declined; educ on safe technique since RLE painful)           Wheelchair Mobility    Modified  Rankin (Stroke Patients Only)       Balance Overall balance assessment: Needs assistance Sitting-balance support: Feet supported;No upper extremity supported Sitting balance-Leahy Scale: Good     Standing balance support: During functional activity Standing balance-Leahy Scale: Fair Standing balance comment: Can static stand without UE support; dynamic stability improved with BUE support                            Cognition Arousal/Alertness: Awake/alert Behavior During Therapy: Flat affect Overall Cognitive Status: Within Functional Limits for tasks assessed                                 General Comments: WFL for simple tasks, not formally assessed      Exercises      General Comments        Pertinent Vitals/Pain Pain Assessment: Faces Faces Pain Scale: Hurts a little bit Pain Location: R knee Pain Descriptors / Indicators: Sore;Constant Pain Intervention(s): Monitored during session    Home Living                      Prior Function            PT Goals (current goals can now be found in the care plan section) Progress towards PT goals: Progressing toward goals    Frequency    Min 3X/week      PT Plan Current plan remains appropriate    Co-evaluation              AM-PAC PT "  6 Clicks" Mobility   Outcome Measure  Help needed turning from your back to your side while in a flat bed without using bedrails?: None Help needed moving from lying on your back to sitting on the side of a flat bed without using bedrails?: None Help needed moving to and from a bed to a chair (including a wheelchair)?: A Little Help needed standing up from a chair using your arms (e.g., wheelchair or bedside chair)?: A Little Help needed to walk in hospital room?: A Little Help needed climbing 3-5 steps with a railing? : A Little 6 Click Score: 20    End of Session   Activity Tolerance: Patient tolerated treatment well;Patient  limited by fatigue Patient left: in chair;with call bell/phone within reach Nurse Communication: Mobility status PT Visit Diagnosis: Muscle weakness (generalized) (M62.81);Pain;Difficulty in walking, not elsewhere classified (R26.2)     Time: 7001-7494 PT Time Calculation (min) (ACUTE ONLY): 17 min  Charges:  $Gait Training: 8-22 mins                    Mabeline Caras, PT, DPT Acute Rehabilitation Services  Pager [phone] Office [place] 05/11/2019, 1:21 PM

## 2019-05-11 NOTE — Progress Notes (Signed)
[facility] PROGRESS NOTE  DANIALLE DEMENT ZSM:[id number] DOB: [birth year] DOA: 05/05/2019 PCP: Josetta Huddle, MD  Assessment/Plan:  #1. Acute on chronic combined congestive heart failure.  - EF 15%.  BNP elevated. Chest xray with mild central venous congestion. - S/P  with IV lasix. Improved with lasix.  - Chart review indicated weight up from last admission to [facility] from [place]. Of note, patient lives alone with family nearby, monthly hospitalizaitons since July.  - Changed IV Lasix and switch to p.o. Lasix starting from 11/13 - I/O: - 1L over r last 24 hrs and Wt 59.7 kg from 60.8 kg  -continue home amiodarone, coreg,  -monitor electrolytes and kidney function -intake and output -daily weight -OP follow up with cards -palliative care for goals of care discussion is ongoing: Awaiting recs   #2. Hypokalemia: Resolved  -Monitor  #3. CKD III. Creatinine at baseline -monitor closely given need for lasix  #4. Hypertension: Stable  - Home meds include  -continue home meds  #5. Paroxysmal AF:   Rate controller  - SR now  -  Home meds include eliquis and BB as well as mexitil.  -continue home meds -monitor  #6. Non-ischemic cardiomyopathy. See #1. No chest pain. HS trop 24 and 26. ekg with sr and LBBB -see above  Code Status: full (Partial)   Disposition Plan: home with home health for physical therapy; Awaiting arrangement    Consultants:  palliative   HPI/Subjective: Was having her BF this AM. Very pleasant women. Understanding. Denies any acute complaints.   Objective: Vitals:   05/11/19 0525 05/11/19 0825  BP: 131/60 (!) 117/58  Pulse: 68   Resp:    Temp:    SpO2:      Intake/Output Summary (Last 24 hours) at 05/11/2019 1201 Last data filed at 05/11/2019 0600 Gross per 24 hour  Intake 238 ml  Output 1350 ml  Net -1112 ml   Filed Weights   05/09/19 0500 05/10/19 0600 05/11/19 0321  Weight: 59.2 kg 60.8 kg 59.7 kg    Exam:   General:  Awake  alert no acute distress  Cardiovascular: rrr no mgr trace LE edema  Respiratory: normal effort, clear to auscultation bilaterally  Abdomen: Softer than yesterday, +BS, no guarding or rebounding  Musculoskeletal: joints without swelling/erythema    Principal Problem:   Acute on chronic combined systolic and diastolic CHF (congestive heart failure) (HCC) Active Problems:   Hypertension   Nonischemic cardiomyopathy (HCC)   Hypokalemia   PAF (paroxysmal atrial fibrillation) ([place])   ICD (implantable cardioverter-defibrillator), dual, [facility]   HLD (hyperlipidemia)   Chronic anticoagulation   DNR (do not resuscitate) discussion   CKD (chronic kidney disease) stage 4, GFR 15-29 ml/min (HCC)   Gallbladder mass   Volume overload   Palliative care by specialist    Time spent: 25 minutes    Thornell Mule MD [facility]  If 7PM-7AM, please contact night-coverage at [email or web address], password [facility] 05/11/2019, 12:01 PM  LOS: 5 days

## 2019-05-11 NOTE — Plan of Care (Signed)

## 2019-05-12 NOTE — Progress Notes (Signed)
[facility] PROGRESS NOTE  Stephanie Yates ZOX:[id number] DOB: April 11, 1946 DOA: 05/05/2019 PCP: Josetta Huddle, MD  Assessment/Plan:  #1. Acute on chronic combined congestive heart failure.  - Stable at this ime - EF 15%.  BNP elevated.  - S/P  with IV lasix. Improved with lasix.  - Chart review indicated weight up from last admission to [facility] from [place]. Of note, patient lives alone with family nearby, monthly hospitalizaitons since July.  - Changed IV Lasix and switch to p.o. Lasix starting from 11/13 -continue home amiodarone, coreg,  -monitor electrolytes and kidney function - Strict intake and output -daily weight -OP follow up with cards -palliative care for goals of care discussion is ongoing   #2. Hypokalemia: Resolved  -Monitor  #3. CKD III. Creatinine at baseline -monitor closely given need for lasix  #4. Hypertension: Stable  - Home meds include  -continue home meds  #5. Paroxysmal AF:   Rate controller  - SR now  -  Home meds include eliquis and BB as well as mexitil.  -continue home meds -monitor  #6. Non-ischemic cardiomyopathy. See #1. No chest pain. HS trop 24 and 26. ekg with sr and LBBB -see above  Code Status: full (Partial)   Disposition Plan: home with home health for physical therapy; Awaiting arrangement    Consultants:  palliative   HPI/Subjective: Said she was waiting for BF but doesn't like them. Very pleasant women. Understanding. Denies any acute complaints. When asked, she says she was waiting for palliative evaluation.   Objective: Vitals:   05/12/19 0822 05/12/19 1133  BP: (!) 116/57 (!) 126/57  Pulse:  63  Resp:  18  Temp:  99.7 F (37.6 C)  SpO2:  99%    Intake/Output Summary (Last 24 hours) at 05/12/2019 1320 Last data filed at 05/12/2019 0831 Gross per 24 hour  Intake 950 ml  Output 650 ml  Net 300 ml   Filed Weights   05/10/19 0600 05/11/19 0321 05/12/19 0436  Weight: 60.8 kg 59.7 kg 59.3 kg     Exam:   General:  Awake alert no acute distress  Cardiovascular: rrr no mgr trace LE edema  Respiratory: normal effort, clear to auscultation bilaterally  Abdomen: Softer than yesterday, +BS, no guarding or rebounding  Musculoskeletal: joints without swelling/erythema    Principal Problem:   Acute on chronic combined systolic and diastolic CHF (congestive heart failure) (HCC) Active Problems:   Hypertension   Nonischemic cardiomyopathy (HCC)   Hypokalemia   PAF (paroxysmal atrial fibrillation) (HCC)   ICD (implantable cardioverter-defibrillator), dual, [facility]   HLD (hyperlipidemia)   Chronic anticoagulation   DNR (do not resuscitate) discussion   CKD (chronic kidney disease) stage 4, GFR 15-29 ml/min (HCC)   Gallbladder mass   Volume overload   Palliative care by specialist    Time spent: 25 minutes    Thornell Mule MD [facility]  If 7PM-7AM, please contact night-coverage at [email or web address], password [facility] 05/12/2019, 1:20 PM  LOS: 6 days

## 2019-05-12 NOTE — Plan of Care (Signed)

## 2019-05-13 DIAGNOSIS — Z515 Encounter for palliative care: Secondary | ICD-10-CM

## 2019-05-13 DIAGNOSIS — Z95810 Presence of automatic (implantable) cardiac defibrillator: Secondary | ICD-10-CM

## 2019-05-13 DIAGNOSIS — E876 Hypokalemia: Secondary | ICD-10-CM

## 2019-05-13 DIAGNOSIS — Z7901 Long term (current) use of anticoagulants: Secondary | ICD-10-CM

## 2019-05-13 DIAGNOSIS — I428 Other cardiomyopathies: Secondary | ICD-10-CM

## 2019-05-13 DIAGNOSIS — I472 Ventricular tachycardia: Secondary | ICD-10-CM

## 2019-05-13 DIAGNOSIS — I10 Essential (primary) hypertension: Secondary | ICD-10-CM

## 2019-05-13 DIAGNOSIS — R531 Weakness: Secondary | ICD-10-CM

## 2019-05-13 DIAGNOSIS — I480 Paroxysmal atrial fibrillation: Secondary | ICD-10-CM

## 2019-05-13 DIAGNOSIS — N184 Chronic kidney disease, stage 4 (severe): Secondary | ICD-10-CM

## 2019-05-13 LAB — BASIC METABOLIC PANEL
Anion gap: 12 (ref 5–15)
BUN: 28 mg/dL — ABNORMAL HIGH (ref 8–23)
CO2: 26 mmol/L (ref 22–32)
Calcium: 8.3 mg/dL — ABNORMAL LOW (ref 8.9–10.3)
Chloride: 97 mmol/L — ABNORMAL LOW (ref 98–111)
Creatinine, Ser: 2.08 mg/dL — ABNORMAL HIGH (ref 0.44–1.00)
GFR calc Af Amer: 26 mL/min — ABNORMAL LOW (ref >60)
GFR calc non Af Amer: 23 mL/min — ABNORMAL LOW (ref >60)
Glucose, Bld: 112 mg/dL — ABNORMAL HIGH (ref 70–99)
Potassium: 3.3 mmol/L — ABNORMAL LOW (ref 3.5–5.1)
Sodium: 135 mmol/L (ref 135–145)

## 2019-05-13 LAB — MAGNESIUM: Magnesium: 2.3 mg/dL (ref 1.7–2.4)

## 2019-05-13 LAB — TROPONIN I (HIGH SENSITIVITY): Troponin I (High Sensitivity): 24 ng/L — ABNORMAL HIGH (ref <18)

## 2019-05-13 LAB — HEMOGLOBIN AND HEMATOCRIT, BLOOD
HCT: 28.4 % — ABNORMAL LOW (ref 36.0–46.0)
Hemoglobin: 9.3 g/dL — ABNORMAL LOW (ref 12.0–15.0)

## 2019-05-13 LAB — POTASSIUM: Potassium: 4.2 mmol/L (ref 3.5–5.1)

## 2019-05-13 MED ORDER — AMIODARONE HCL 200 MG PO TABS
200.0000 mg | ORAL_TABLET | Freq: Two times a day (BID) | ORAL | Status: DC
  Administered 2019-05-13 – 2019-05-14 (×2): 200 mg via ORAL
  Filled 2019-05-13 (×2): qty 1

## 2019-05-13 MED ORDER — POTASSIUM CHLORIDE 20 MEQ PO PACK
40.0000 meq | PACK | Freq: Once | ORAL | Status: AC
  Administered 2019-05-13: 40 meq via ORAL
  Filled 2019-05-13: qty 2

## 2019-05-13 MED ORDER — MAGNESIUM SULFATE 2 GM/50ML IV SOLN
2.0000 g | Freq: Once | INTRAVENOUS | Status: AC
  Administered 2019-05-13: 2 g via INTRAVENOUS
  Filled 2019-05-13: qty 50

## 2019-05-13 NOTE — Plan of Care (Signed)

## 2019-05-13 NOTE — Progress Notes (Signed)
Patient ID: Stephanie Yates, female   DOB: Jan 22, 1946, 73 y.o.   MRN: [id number]   This NP visited patient at the bedside as a follow up for palliative medicine needs and emotional support  Patient is alert, she appears tired.  She is oriented x3.  Continued conversation regarding diagnosis, prognosis, GOCs, disposition and anticipatory care needs.  Today is day 7 of this most recent hospitalization.  She has had 6 hospitalizations in the last 6 months.  We again discussed the seriousness of her multiple comorbidities and the   limitations of medical interventions to prolong life when a body begins to fail to thrive.   Ms. Wahab's body language suggests that she understands the seriousness of her current medical situation and the limited prognosis however at this time she continues to verbalize hope for improvement.  I worry that she has poor insight into the likely cycle of aggressive stabilization, decline and again stabilization and further decline  Discussed home hospice, Ms Lumbert adamantly refused. Agrees to continue OP community based palliative services.  Patient verbalizes her desire to discharge home with home health.  Spoke to daughter, she understands the seriousness of her medical situation, but will totally support "whatever her mother wants to do".  Patient is high risk to decompensate  Questions and concerns addressed   Discussed with Dr Earnest Conroy  Total time spent on the unit was 40 minutes  Greater than 50% of the time was spent in counseling and coordination of care  Wadie Lessen NP  Palliative Medicine Team Team Phone # [phone] Pager [phone]

## 2019-05-13 NOTE — Progress Notes (Signed)
Patient had a brief episode of torsades, patient is asymptomatic and is in chair. MD Kamineni notified. Will continue to monitor.

## 2019-05-13 NOTE — Progress Notes (Addendum)
PROGRESS NOTE    Stephanie Yates  VQQ:[id number]  DOB: [birth year]  DOA: 05/05/2019 PCP: Josetta Huddle, MD  Brief Narrative:  73 y.o.femalewith medical history significant ofdilated cardiomyopathy with systolic dysfunction CHF with EF of 15% s/p AICD in 2014, chronic kidney disease stage IV (baseline creatinine 1.7 to 2.0), anemia of chronic disease, atrial fibrillation, hypertension, who presents to the ER with progressive shortness of breath and worsening lower extremity edema. Patient is compliant with 80 mg of Lasix at home. ED Course:Temperature 99, BP 145/108, HR 70, RR 22, O2 sat 93% on RA. Sodium is 137 potassium 3.2 chloride 104 CO2 to of 23 glucose 98 BUN 17 creatinine 1.60 and calcium 8.3. White count is 8.3, hemoglobin 9.6 and platelet count of 333. BNP is 1012 EKG with NSR and LBBB.HS troponin is 24.Chest x-ray showed bibasilar atelectasis with central venous congestion slight cardiomegaly.  Hospital course: Patient admitted for mx of acute systolic CHF exacerbation with IV lasix. Diuretics transitioned to oral on 11/13. Creatinine around 1.9. Palliative care consulted for goals of care discussions given low EF and recurrent hospitalizations since July.She has OP community based Palliative services with [facility]. Code status DNI.   Subjective:  Patient sitting in chair comfortably.  Nurse reported a brief episode of wide-complex polymorphic tachycardia with torsade pattern (screenshot of telemetry monitor shown below) this afternoon around 1:27 PM.  Patient however asymptomatic during the episode and denies feeling any palpitations/chest pain/dyspnea/AICD discharge.  Physical therapy at bedside and she is reluctant about discharge planning as she states she has been feeling weak and lightheaded at times while walking.  PT to check orthostatics.  Patient noted to be on 2 L O2 via nasal cannula but is not on home O2 at baseline.  She is saturating 99 to 100%.   Objective:  Vitals:   05/13/19 0649 05/13/19 0738 05/13/19 0949 05/13/19 1244  BP: (!) 131/53 124/61 118/71 (!) 127/46  Pulse:  65 60 65  Resp:  17  17  Temp:  98.7 F (37.1 C) 97.8 F (36.6 C) 98.4 F (36.9 C)  TempSrc:  Oral Oral Oral  SpO2:  99% 99% 99%  Weight:      Height:        Intake/Output Summary (Last 24 hours) at 05/13/2019 1536 Last data filed at 05/13/2019 1500 Gross per 24 hour  Intake 720.23 ml  Output 800 ml  Net -79.77 ml   Filed Weights   05/11/19 0321 05/12/19 0436 05/13/19 0556  Weight: 59.7 kg 59.3 kg 60 kg    Physical Examination:  General exam: Appears calm and comfortable  Respiratory system: Clear to auscultation. Respiratory effort normal.   Cardiovascular system: S/p AICD on LACW.  S1 & S2 heard. No JVD. No pedal edema. Gastrointestinal system: Abdomen is nondistended, soft and nontender.  Normal bowel sounds heard. Central nervous system: Alert and oriented. No focal neurological deficits. Extremities: Symmetric 5 x 5 power. Skin: No rashes, lesions or ulcers Psychiatry: Judgement and insight appear normal. Mood & affect appropriate.   Data Reviewed: I have personally reviewed following labs and imaging studies  CBC: Recent Labs  Lab 05/13/19 1426  HGB 9.3*  HCT 43.3*   Basic Metabolic Panel: Recent Labs  Lab 05/07/19 0450 05/08/19 0328 05/09/19 0458 05/10/19 0445 05/13/19 0547  NA 138 137 139 136 135  K 4.4 4.4 4.4 4.2 3.3*  CL 106 102 102 101 97*  CO2 25 26 27 27 26   GLUCOSE 95 91 90 89 112*  BUN 19 22 25* 28* 28*  CREATININE 1.66* 1.61* 1.97* 1.98* 2.08*  CALCIUM 8.3* 8.6* 8.7* 8.5* 8.3*  MG 1.8  --   --   --   --    GFR: Estimated Creatinine Clearance: 18.9 mL/min (A) (by C-G formula based on SCr of 2.08 mg/dL (H)). Liver Function Tests: No results for input(s): AST, ALT, ALKPHOS, BILITOT, PROT, ALBUMIN in the last 168 hours. No results for input(s): LIPASE, AMYLASE in the last 168 hours. No results for input(s): AMMONIA in  the last 168 hours. Coagulation Profile: No results for input(s): INR, PROTIME in the last 168 hours. Cardiac Enzymes: No results for input(s): CKTOTAL, CKMB, CKMBINDEX, TROPONINI in the last 168 hours. BNP (last 3 results) No results for input(s): PROBNP in the last 8760 hours. HbA1C: No results for input(s): HGBA1C in the last 72 hours. CBG: No results for input(s): GLUCAP in the last 168 hours. Lipid Profile: No results for input(s): CHOL, HDL, LDLCALC, TRIG, CHOLHDL, LDLDIRECT in the last 72 hours. Thyroid Function Tests: No results for input(s): TSH, T4TOTAL, FREET4, T3FREE, THYROIDAB in the last 72 hours. Anemia Panel: No results for input(s): VITAMINB12, FOLATE, FERRITIN, TIBC, IRON, RETICCTPCT in the last 72 hours. Sepsis Labs: No results for input(s): PROCALCITON, LATICACIDVEN in the last 168 hours.  Recent Results (from the past 240 hour(s))  SARS CORONAVIRUS 2 (TAT 6-24 HRS) Nasopharyngeal Nasopharyngeal Swab     Status: None   Collection Time: 05/05/19  9:42 PM   Specimen: Nasopharyngeal Swab  Result Value Ref Range Status   SARS Coronavirus 2 NEGATIVE NEGATIVE Final    Comment: (NOTE) SARS-CoV-2 target nucleic acids are NOT DETECTED. The SARS-CoV-2 RNA is generally detectable in upper and lower respiratory specimens during the acute phase of infection. Negative results do not preclude SARS-CoV-2 infection, do not rule out co-infections with other pathogens, and should not be used as the sole basis for treatment or other patient management decisions. Negative results must be combined with clinical observations, patient history, and epidemiological information. The expected result is Negative. Fact Sheet for Patients: SugarRoll.be Fact Sheet for Healthcare Providers: https://www.woods-mathews.com/ This test is not yet approved or cleared by the [place] FDA and  has been authorized for detection and/or diagnosis of  SARS-CoV-2 by FDA under an Emergency Use Authorization (EUA). This EUA will remain  in effect (meaning this test can be used) for the duration of the COVID-19 declaration under Section 56 4(b)(1) of the Act, 21 U.S.C. section 360bbb-3(b)(1), unless the authorization is terminated or revoked sooner. Performed at [facility], [place] [address] [place], [place] [address]       Radiology Studies: No results found.      EKG: NSR, prolonged PR interval, LBBB with QTC at 594 ms  Scheduled Meds: . amiodarone  200 mg Oral Daily  . apixaban  2.5 mg Oral BID  . atorvastatin  40 mg Oral q1800  . carvedilol  3.125 mg Oral BID WC  . cholecalciferol  1,000 Units Oral QHS  . feeding supplement (ENSURE ENLIVE)  237 mL Oral Daily  . ferrous gluconate  324 mg Oral BID WC  . furosemide  40 mg Oral Daily  . hydrALAZINE  12.5 mg Oral TID  . mexiletine  200 mg Oral BID  . pantoprazole  40 mg Oral Daily  . senna-docusate  1 tablet Oral BID  . sodium chloride flush  3 mL Intravenous Q12H  . sucralfate  1 g Oral TID WC & HS  Continuous Infusions: . sodium chloride 250 mL (05/13/19 1355)    Assessment & Plan:    1.  Acute on chronic systolic CHF exacerbation: Patient has low EF at 15%.  Patient received IV diuresis and initial hospital course and now transition to oral Lasix at 40 mg daily.  Not sure if she was hypoxic or O2 placed for comfort on admission.  Taper O2 to off as tolerated.  Continue cardiac meds including Coreg, hydralazine in addition to diuretics.  Not on ACE inhibitors likely due to #4  2.  NSVT: Patient had a brief episode of polymorphic ventricular rhythm as shown above.?  Torsades versus artifact.  Per prior cardiology note from Dr. Caryl Comes in August, patient had episodes of V. tach x5 requiring hospitalization and underwent AICD interrogation at that time, recommended to stay on 200 mg amiodarone.  Discussed with [facility] cardiology Dr. Marlou Porch who reviewed about telemetry  strip and stated to continue current medications but will have AICD interrogated.  If true recurrent V. tach, may need to increase amiodarone dose.  Patient received 40 mEq oral potassium earlier, will recheck level this evening and keep it close to 4.0.  Also administered IV mag 2 g x 1 this afternoon.  Hemoglobin 9.3.  Will continue to monitor on telemetry and await cardiology recommendations.  3.  Dizziness: Secondary to orthostatic hypotension in the setting of low EF versus dehydration due to overdiuresis versus problem #2.  Will check orthostatics.  Dizziness appears to be constant, likely not related to problem #2.  Patient currently noted to be on O2 2 L, will obtain walking desat studies in a.m.  Prefer to keep O2 on today in the setting of ongoing arrhythmias.  4.  CKD stage IV: Patient's baseline creatinine appears to be 1.7-2.  She did receive aggressive diuresis in initial hospital course.  Currently stable around 2.0 with oral Lasix.    5.  Hypokalemia: Replace as discussed above.  Check magnesium with p.m. labs.  Keep magnesium level greater than 2  6.  Paroxysmal atrial fibrillation: Currently in normal sinus rhythm.  Recent AICD interrogation in August showed moderate burden of PAF.  She remains on Coreg, amiodarone and Eliquis  7.  Hypertension: Blood pressure elevated on presentation but currently on the low side after diuresis and with cardiac medications.  Check orthostatics as discussed above.  8.  Anemia of chronic kidney disease: Hemoglobin stable around 9.  No signs of bleeding.  DVT prophylaxis: On Eliquis Code Status: DNI Family / Patient Communication: Discussed with patient at bedside.  Also discussed with palliative care NP who apparently met with patient today and had extensive discussion with her as well as her daughter regarding goals of care.  Patient declined home hospice.  She does have prior home palliative care services which can be resumed upon discharge.  Disposition Plan: Patient refused ANY placement other than home--plan to discharge home with [facility] services in next 24 to 48 hours..      LOS: 7 days    Time spent: 35 minutes  Addendum 4:30 PM: Dr. Marlou Porch discussed with EP cardiology and recommended to increase amiodarone dosage to 200 mg twice daily with no further recommendations for AICD interrogation.  Cardiology aware of prolonged QTC on today's EKG at 594 ms, recommended repeat EKG in a.m. for follow-up.  Dr. Marlou Porch advised that her treatment is mostly geared towards palliative care and that her prognosis remains poor.   Guilford Shi, MD [facility] Pager [phone]  If 7PM-7AM, please contact night-coverage [email or web address] Password [facility] 05/13/2019, 3:36 PM

## 2019-05-13 NOTE — Progress Notes (Signed)
Physical Therapy Treatment Patient Details Name: Stephanie Yates MRN: [id number] DOB: September 22, 1945 Today's Date: 05/13/2019    History of Present Illness Pt is a 73 year old female admitted 05/05/19 with B LE swelling, SOB. Patient with CHF, HTN, Afib, HLD, CKD. Of note, many recent admissions for same.    PT Comments    Patient seen for mobility progression. Pt requires supervision/min guard for functional transfers and min guard/min A for short distance gait. Pt reports feeling fatigued and unable to tolerate activity as well as last PT session. Pt with diastolic change of 17-00 from sitting to standing however no c/o dizziness, just fatigue. Pt reports "this happens sometimes" referring to increased difficulty with mobility compared to two days ago. Continue to progress as tolerated.     Follow Up Recommendations  Home health PT;Supervision for mobility/OOB(HHRN for CHF management)     Equipment Recommendations  None recommended by PT    Recommendations for Other Services       Precautions / Restrictions Precautions Precautions: Fall Precaution Comments: watch 02    Mobility  Bed Mobility               General bed mobility comments: pt OOB in chair upon arrival  Transfers Overall transfer level: Needs assistance Equipment used: Rolling walker (2 wheeled) Transfers: Sit to/from Stand Sit to Stand: Min guard;Supervision Stand pivot transfers: Min guard       General transfer comment: supervision for safety to stand from recliner and BSC; min guard for safety when stepping to [facility]  Ambulation/Gait Ambulation/Gait assistance: Min guard;Min assist Gait Distance (Feet): (~20 ft total) Assistive device: Rolling walker (2 wheeled) Gait Pattern/deviations: Step-through pattern;Decreased stride length;Trunk flexed Gait velocity: Decreased   General Gait Details: cues for safe use of AD; pt took increased time to begin ambulating after standing from [facility] and marching in  place instead of stepping forward; pt requires assist to steady when turning in room; pt began ambulating around bed and then requesting to go back to chair; no c/o dizziness but states she gets dizzy "sometimes"   Stairs             Wheelchair Mobility    Modified Rankin (Stroke Patients Only)       Balance Overall balance assessment: Needs assistance Sitting-balance support: Feet supported;No upper extremity supported Sitting balance-Leahy Scale: Good     Standing balance support: During functional activity;Single extremity supported Standing balance-Leahy Scale: Fair                              Cognition Arousal/Alertness: Awake/alert Behavior During Therapy: Flat affect Overall Cognitive Status: Within Functional Limits for tasks assessed                                        Exercises      General Comments General comments (skin integrity, edema, etc.): BP in sitting 118/96 and in standing 138/65; no c/o dizziness      Pertinent Vitals/Pain Pain Assessment: No/denies pain    Home Living                      Prior Function            PT Goals (current goals can now be found in the care plan section) Acute Rehab PT Goals Patient Stated Goal:  to return home Progress towards PT goals: Progressing toward goals    Frequency    Min 3X/week      PT Plan Current plan remains appropriate    Co-evaluation              AM-PAC PT "6 Clicks" Mobility   Outcome Measure  Help needed turning from your back to your side while in a flat bed without using bedrails?: None Help needed moving from lying on your back to sitting on the side of a flat bed without using bedrails?: None Help needed moving to and from a bed to a chair (including a wheelchair)?: A Little Help needed standing up from a chair using your arms (e.g., wheelchair or bedside chair)?: A Little Help needed to walk in hospital room?: A Little Help  needed climbing 3-5 steps with a railing? : A Little 6 Click Score: 20    End of Session Equipment Utilized During Treatment: Gait belt Activity Tolerance: Patient limited by fatigue Patient left: in chair;with call bell/phone within reach;with nursing/sitter in room Nurse Communication: Mobility status(pt had BM) PT Visit Diagnosis: Muscle weakness (generalized) (M62.81);Pain;Difficulty in walking, not elsewhere classified (R26.2) Pain - Right/Left: (bilateral) Pain - part of body: (abdomen)     Time: 4562-5638 PT Time Calculation (min) (ACUTE ONLY): 26 min  Charges:  $Gait Training: 8-22 mins $Therapeutic Activity: 8-22 mins                     Earney Navy, PTA Acute Rehabilitation Services Pager: [phone] Office: [phone]     Darliss Cheney 05/13/2019, 4:38 PM

## 2019-05-13 NOTE — Care Management Important Message (Signed)
Important Message  Patient Details  Name: Stephanie Yates MRN: [id number] Date of Birth: 03-11-1946   Medicare Important Message Given:  Yes     Shelda Altes 05/13/2019, 2:11 PM

## 2019-05-14 DIAGNOSIS — I4721 Torsades de pointes: Secondary | ICD-10-CM

## 2019-05-14 DIAGNOSIS — I472 Ventricular tachycardia: Secondary | ICD-10-CM

## 2019-05-14 LAB — BASIC METABOLIC PANEL
Anion gap: 11 (ref 5–15)
BUN: 27 mg/dL — ABNORMAL HIGH (ref 8–23)
CO2: 26 mmol/L (ref 22–32)
Calcium: 8.3 mg/dL — ABNORMAL LOW (ref 8.9–10.3)
Chloride: 101 mmol/L (ref 98–111)
Creatinine, Ser: 1.8 mg/dL — ABNORMAL HIGH (ref 0.44–1.00)
GFR calc Af Amer: 31 mL/min — ABNORMAL LOW (ref >60)
GFR calc non Af Amer: 27 mL/min — ABNORMAL LOW (ref >60)
Glucose, Bld: 86 mg/dL (ref 70–99)
Potassium: 4.3 mmol/L (ref 3.5–5.1)
Sodium: 138 mmol/L (ref 135–145)

## 2019-05-14 LAB — MAGNESIUM: Magnesium: 2.3 mg/dL (ref 1.7–2.4)

## 2019-05-14 MED ORDER — MAGNESIUM OXIDE 400 (241.3 MG) MG PO TABS: 1.0000 | ORAL_TABLET | Freq: Every day | ORAL | 3 refills | Status: DC

## 2019-05-14 MED ORDER — AMIODARONE HCL 200 MG PO TABS: 200.0000 mg | ORAL_TABLET | Freq: Two times a day (BID) | ORAL | 0 refills | Status: DC

## 2019-05-14 NOTE — Progress Notes (Signed)
Physical Therapy Treatment Patient Details Name: Stephanie Yates MRN: [id number] DOB: 06/03/1946 Today's Date: 05/14/2019    History of Present Illness Pt is a 73 year old female admitted 05/05/19 with B LE swelling, SOB. Patient with CHF, HTN, Afib, HLD, CKD. Of note, many recent admissions for same.    PT Comments    Pt limited by fatigue and mild dizziness during session. Pt with significantly reduced gait speed, experiencing multiple minor LOB while utilizing RW. Pt reports no SOB and is limited rather by general LE fatigue and weakness. Pt continues to require physical assistance to perform transfers and is regressing some in this aspect of mobility. Pt will benefit from continued acute PT services to reduce falls risk and aide in a return to independent mobility. PT recommending discharge home with HHPT and 24/7 supervision from family initially. If family unable to be present then PT recommends SNF placement.   Follow Up Recommendations  Home health PT;Supervision/Assistance - 24 hour;SNF(HHPT with 24/7 assist vs SNF)     Equipment Recommendations  None recommended by PT    Recommendations for Other Services       Precautions / Restrictions Precautions Precautions: Fall Precaution Comments: watch 02 Restrictions Weight Bearing Restrictions: No    Mobility  Bed Mobility Overal bed mobility: Modified Independent             General bed mobility comments: increased time for supine to sit  Transfers Overall transfer level: Needs assistance Equipment used: Rolling walker (2 wheeled) Transfers: Sit to/from [facility] Sit to Stand: Min assist;Min guard Stand pivot transfers: Min assist       General transfer comment: pt requiring minG from bed and minA from recliner or BSC to sit to stand with RW.  Ambulation/Gait Ambulation/Gait assistance: Min guard Gait Distance (Feet): 50 Feet Assistive device: Rolling walker (2 wheeled) Gait  Pattern/deviations: Step-to pattern;Shuffle Gait velocity: Decreased Gait velocity interpretation: <1.31 ft/sec, indicative of household ambulator General Gait Details: short shuffling step to gait with reduced foot clearance bilaterally, increased trunk flexion intermittently   Stairs             Wheelchair Mobility    Modified Rankin (Stroke Patients Only)       Balance Overall balance assessment: Needs assistance Sitting-balance support: Single extremity supported;Feet supported Sitting balance-Leahy Scale: Good     Standing balance support: Single extremity supported;During functional activity Standing balance-Leahy Scale: Fair Standing balance comment: minG for dynamic standing balance                            Cognition Arousal/Alertness: Awake/alert Behavior During Therapy: Flat affect Overall Cognitive Status: Within Functional Limits for tasks assessed                                        Exercises      General Comments General comments (skin integrity, edema, etc.): BP 111/53 in sitting and 125/52 in standing, reporting mild dizziness during session. Pt saturating 93-96% on RA      Pertinent Vitals/Pain Pain Assessment: No/denies pain    Home Living                      Prior Function            PT Goals (current goals can now be found in the care  plan section) Acute Rehab PT Goals Patient Stated Goal: to return home Progress towards PT goals: Not progressing toward goals - comment(pt limited by fatigue and dizziness)    Frequency    Min 3X/week      PT Plan Current plan remains appropriate    Co-evaluation              AM-PAC PT "6 Clicks" Mobility   Outcome Measure  Help needed turning from your back to your side while in a flat bed without using bedrails?: None Help needed moving from lying on your back to sitting on the side of a flat bed without using bedrails?: None Help needed  moving to and from a bed to a chair (including a wheelchair)?: A Little Help needed standing up from a chair using your arms (e.g., wheelchair or bedside chair)?: A Little Help needed to walk in hospital room?: A Little Help needed climbing 3-5 steps with a railing? : A Lot 6 Click Score: 19    End of Session Equipment Utilized During Treatment: (none) Activity Tolerance: Patient limited by fatigue Patient left: in chair;with call bell/phone within reach Nurse Communication: Mobility status PT Visit Diagnosis: Muscle weakness (generalized) (M62.81);Pain;Difficulty in walking, not elsewhere classified (R26.2)     Time: 3276-1470 PT Time Calculation (min) (ACUTE ONLY): 33 min  Charges:  $Gait Training: 8-22 mins $Therapeutic Activity: 8-22 mins                     Zenaida Niece, PT, DPT Acute Rehabilitation Pager: [phone]    Zenaida Niece 05/14/2019, 9:28 AM

## 2019-05-14 NOTE — Progress Notes (Signed)
SATURATION QUALIFICATIONS: (This note is used to comply with regulatory documentation for home oxygen)  Patient Saturations on Room Air at Rest = 96%  Patient Saturations on Room Air while Ambulating = 93%    Pt does not need home oxygen

## 2019-05-14 NOTE — Discharge Summary (Signed)
Physician Discharge Summary  QUENNA DOEPKE YBO:[id number] DOB: February 26, 1946 DOA: 05/05/2019  PCP: Josetta Huddle, MD  Admit date: 05/05/2019 Discharge date: 05/14/2019 Consultations: Palliative care consult-Dr. Domingo Cocking, phone consultation with [facility] cardiology-Dr. Marlou Porch Admitted From: Home Disposition: Home with home palliative care  Discharge Diagnoses:  Principal Problem:   Acute on chronic combined systolic and diastolic CHF (congestive heart failure) ([place]) Active Problems:   NSVT (nonsustained ventricular tachycardia) ([place])   Torsades de pointes ([place])   ICD (implantable cardioverter-defibrillator), dual, [place]   Nonischemic cardiomyopathy ([place])   Hypertension   Hypokalemia   PAF (paroxysmal atrial fibrillation) (HCC)   HLD (hyperlipidemia)   Chronic anticoagulation   CKD (chronic kidney disease) stage 4, GFR 15-29 ml/min (HCC)   Gallbladder mass   Volume overload   Palliative care by specialist   Weakness generalized   Hospital Course Summary: 73 y.o.femalewith medical history significant ofdilated cardiomyopathy with systolic CHF, EF of 27% s/p AICD in 2014, chronic kidney disease stage IV(baseline creatinine 1.7 to 2.0), anemia of chronic disease, atrial fibrillation, hypertension, recurrent episodes of NSVT on dual antiarrhythmics,presented to the ER on 11/8 with progressive shortness of breath and worsening lower extremity edema. Patient isreported compliance with80 mg of Lasix and other medications at home. ED Course:Temperature 99,BP145/108, HR70, RR22, O2 sat93% onRA.Sodium is 137potassium 3.2chloride 104 CO2 to of 23 glucose 98 BUN 17creatinine 1.60 and calcium 8.3. White count is8.3,hemoglobin 9.6 and platelet count of 333. BNP is 1012EKGwith NSRand LBBB.HStroponin is 24.Chest x-ray showed bibasilar atelectasis with central venous congestion slight cardiomegaly. Hospital course:Patient admittedfor mx of acute systolicCHF exacerbationwith  IV lasix. Diuretics transitioned to oral on 11/13. Hospital course complicated by a very brief episode of polymorphic nonsustained V. tach-torsades pattern, patient remained asymptomatic without AICD discharge.Palliative care consulted for goals of care discussions given low EF and recurrent hospitalizations since July.  1.  Acute on chronic systolic CHF exacerbation: Patient has low EF at 75%.  Patient received IV diuresis in the initial hospital course and subsequently transitioned to oral Lasix at 40 mg daily.  It appears that O2 1.5 L via nasal cannula was placed for comfort on admission.  She has been saturating well on room air for the last 2 days.  Walking desat studies prior to discharge showed O2 sat greater than 93%.  Continue cardiac meds including Coreg, hydralazine in addition to diuretics.  Not on ACE inhibitors likely due to #4  2.  NSVT: Patient had a brief episode of polymorphic ventricular rhythm as shown below.?  Torsades versus artifact per my discussion with Dr. Marlou Porch..  Per prior cardiology note from Dr. Caryl Comes in August, patient had episodes of V. tach x5 requiring hospitalization and underwent AICD interrogation at that time, recommended to stay on 200 mg amiodarone and continue mexiletine.  Discussed with [facility] cardiology Dr. Marlou Porch who reviewed telemetry strip and stated to continue current medications but increase amiodarone dosage to 200 mg twice daily.   Cardiology aware of prolonged QTC 486 ms on EKG from 11/16 and repeat EKG today after amiodarone twice daily dosing shows QTC worsening to 509 ms.  Discussed with Dr. Gwenlyn Found (covering for Dr. Marlou Porch) who stated okay to continue amiodarone twice daily dosing and current dose of mexiletine but they will follow-up closely in their clinic within a week.  Cardiology also advised that most of her treatment regimen at this point is palliative given low EF recurrent ventricular tachycardias and high risk for sudden cardiac death.  She does  have AICD in  place.  Cardiology did not feel repeat AICD interrogation was warranted at this time.  They will follow-up in their clinic closely.  Patient's potassium level was corrected to be close to 4 and magnesium level greater than 2 prior to discharge.  She does take magnesium supplements at home (400 mg daily) which she can continue.  Patient should have repeat EKG/BMP/magnesium level checked through PCP office or cardiology clinic within a week.  She is advised to avoid other QT prolonging agents.  3.  Dizziness:  Patient reported on and off dizzy spells.  Suspected secondary to orthostatic hypotension in the setting of low EF versus dehydration due to overdiuresis versus problem #2 versus overall deconditioning.  Orthostatic blood pressure check during the hospital course was within normal limits. Dizziness appears to be constant, likely not related to problem #2.  She denies any spinning sensation.  Walking O2 desat study showed adequate oxygenation.  Patient feels better today and did well on walking in the hallway.  Follow-up with PCP/cardiology.  Home PT/OT referrals placed  4.  CKD stage IV: Patient's baseline creatinine appears to be 1.7-2.  She did receive aggressive diuresis in initial hospital course.  Currently stable around 1.8-2.0 with oral Lasix.    5.  Hypokalemia:  In the setting of IV diuresis while hospitalized.  Replaced as discussed above.  Keep magnesium level greater than 2.  Currently back on home dose of Lasix and potassium close to 4.  Reluctant to prescribe chronic oral supplementation and concern for problem #4.  BMP advised in next follow-up visit  6.  Paroxysmal atrial fibrillation: Currently in normal sinus rhythm.  Recent AICD interrogation in August showed moderate burden of PAF.  She remains on Coreg, amiodarone and Eliquis  7.  Hypertension: Blood pressure elevated on presentation but currently on the low side after diuresis and with cardiac medications.     8.  Anemia of chronic kidney disease: Hemoglobin stable around 9.  No signs of bleeding.  Discharge Exam:  Vitals:   05/14/19 1326 05/14/19 1647  BP: (!) 115/58 138/62  Pulse: 61 64  Resp: 19   Temp: 97.8 F (36.6 C)   SpO2: 97% 95%   Vitals:   05/14/19 0404 05/14/19 0830 05/14/19 1326 05/14/19 1647  BP: 137/65 (!) 125/52 (!) 115/58 138/62  Pulse: 66 91 61 64  Resp: 16  19   Temp: 98.4 F (36.9 C)  97.8 F (36.6 C)   TempSrc: Oral  Oral   SpO2: 98%  97% 95%  Weight: 60.3 kg     Height:        General: Pt is alert, awake, not in acute distress Cardiovascular: RRR, S1/S2 +, no rubs, no gallops s/p AICD Respiratory: CTA bilaterally, no wheezing, no rhonchi Abdominal: Soft, NT, ND, bowel sounds + Extremities: no edema, no cyanosis  Discharge Condition:Stable CODE STATUS: DO NOT INTUBATE (DNI) Diet recommendation: Low-salt low-fat Recommendations for Outpatient Follow-up:  1. Follow up with PCP: 1 week 2. Follow up with consultants: Cardiology clinic within a week (spoke with cardiology coordinator/Dr. Gwenlyn Found who will arrange) 3. Please obtain follow up labs including: H&H, BMP, magnesium, phosphorus.  Repeat EKG for QTC follow-up  Home Health services upon discharge: Home health PT/OT/RN/aide referrals placed.  She has OP community based Palliative services with [facility] that she will resume.  Equipment/Devices upon discharge: Did not qualify for home O2   Discharge Instructions:  Discharge Instructions    (HEART FAILURE PATIENTS) Call MD:  Anytime you  have any of the following symptoms: 1) 3 pound weight gain in 24 hours or 5 pounds in 1 week 2) shortness of breath, with or without a dry hacking cough 3) swelling in the hands, feet or stomach 4) if you have to sleep on extra pillows at night in order to breathe.   Complete by: As directed    Call MD for:   Complete by: As directed    Palpitations, chest pain, AICD discharge   Call MD for:  difficulty  breathing, headache or visual disturbances   Complete by: As directed    Call MD for:  persistant dizziness or light-headedness   Complete by: As directed    Diet - low sodium heart healthy   Complete by: As directed    Increase activity slowly   Complete by: As directed    Increase activity slowly   Complete by: As directed    Schedule Transitional Care Management Visit   Complete by: As directed    With PCP within 1 week     Allergies as of 05/14/2019      Reactions   Strawberry Extract Hives, Itching, Swelling         Medication List    TAKE these medications   acetaminophen 500 MG tablet Commonly known as: TYLENOL Take 1,000 mg by mouth every 6 (six) hours as needed (pain).   albuterol 108 (90 Base) MCG/ACT inhaler Commonly known as: VENTOLIN HFA Inhale 2 puffs into the lungs every 4 (four) hours as needed for wheezing or shortness of breath.   amiodarone 200 MG tablet Commonly known as: Pacerone Take 1 tablet (200 mg total) by mouth 2 (two) times daily. What changed: when to take this   apixaban 2.5 MG Tabs tablet Commonly known as: ELIQUIS Take 1 tablet (2.5 mg total) by mouth 2 (two) times daily.   atorvastatin 40 MG tablet Commonly known as: LIPITOR Take 1 tablet (40 mg total) by mouth daily at 6 PM.   carvedilol 3.125 MG tablet Commonly known as: COREG Take 1 tablet (3.125 mg total) by mouth 2 (two) times daily with a meal.   Ensure Take 237 mLs by mouth daily.   ferrous gluconate 324 MG tablet Commonly known as: FERGON Take 324 mg by mouth 2 (two) times daily.   furosemide 80 MG tablet Commonly known as: LASIX Take 0.5 tablets (40 mg total) by mouth daily.   hydrALAZINE 25 MG tablet Commonly known as: APRESOLINE Take 0.5 tablets (12.5 mg total) by mouth 3 (three) times daily.   magnesium oxide 400 (241.3 Mg) MG tablet Commonly known as: MAGnesium-Oxide Take 1 tablet (400 mg total) by mouth daily. What changed: how much to take    mexiletine 200 MG capsule Commonly known as: MEXITIL Take 1 capsule (200 mg total) by mouth 2 (two) times daily.   nitroGLYCERIN 0.4 MG SL tablet Commonly known as: NITROSTAT Place 1 tablet (0.4 mg total) under the tongue every 5 (five) minutes as needed for chest pain.   ondansetron 4 MG disintegrating tablet Commonly known as: Zofran ODT Take 1 tablet (4 mg total) by mouth every 8 (eight) hours as needed for nausea or vomiting.   pantoprazole 40 MG tablet Commonly known as: Protonix Take 1 tablet (40 mg total) by mouth daily.   polyethylene glycol 17 g packet Commonly known as: MIRALAX / GLYCOLAX Take 17 g by mouth daily. Can take up to twice a day for constipation. What changed:   when to take this  reasons to take this  additional instructions   senna-docusate 8.6-50 MG tablet Commonly known as: Senokot-S Take 1 tablet by mouth at bedtime as needed for mild constipation.   sucralfate 1 GM/10ML suspension Commonly known as: CARAFATE Take 10 mLs (1 g total) by mouth 4 (four) times daily -  with meals and at bedtime.   Vitamin D-3 25 MCG (1000 UT) Caps Take 1,000 Units by mouth at bedtime.            Durable Medical Equipment  (From admission, onward)         Start     Ordered   05/14/19 1513  For home use only DME Walker rolling  Once    Question:  Patient needs a walker to treat with the following condition  Answer:  Weakness   05/14/19 1513         Follow-up Information    [facility] [place] Follow up.   Why: RN,PT,OT, aide Contact information: [place]  [address]       Josetta Huddle, MD.   Specialty: Internal Medicine Contact information: [address] [place] [address] [phone]        [place] Follow up.   Why: rolling walker         Allergies  Allergen Reactions  . Strawberry Extract Hives, Itching and Swelling           The  results of significant diagnostics from this hospitalization (including imaging, microbiology, ancillary and laboratory) are listed below for reference.    Labs: BNP (last 3 results) Recent Labs    01/10/19 0422 04/15/19 2022 05/05/19 1849  BNP 1,735.2* 566.4* 1,540.0*   Basic Metabolic Panel: Recent Labs  Lab 05/08/19 0328 05/09/19 0458 05/10/19 0445 05/13/19 0547 05/13/19 1703 05/14/19 0357  NA 137 139 136 135  --  138  K 4.4 4.4 4.2 3.3* 4.2 4.3  CL 102 102 101 97*  --  101  CO2 26 27 27 26   --  26  GLUCOSE 91 90 89 112*  --  86  BUN 22 25* 28* 28*  --  27*  CREATININE 1.61* 1.97* 1.98* 2.08*  --  1.80*  CALCIUM 8.6* 8.7* 8.5* 8.3*  --  8.3*  MG  --   --   --   --  2.3 2.3   Liver Function Tests: No results for input(s): AST, ALT, ALKPHOS, BILITOT, PROT, ALBUMIN in the last 168 hours. No results for input(s): LIPASE, AMYLASE in the last 168 hours. No results for input(s): AMMONIA in the last 168 hours. CBC: Recent Labs  Lab 05/13/19 1426  HGB 9.3*  HCT 28.4*   Cardiac Enzymes: No results for input(s): CKTOTAL, CKMB, CKMBINDEX, TROPONINI in the last 168 hours. BNP: Invalid input(s): POCBNP CBG: No results for input(s): GLUCAP in the last 168 hours. D-Dimer No results for input(s): DDIMER in the last 72 hours. Hgb A1c No results for input(s): HGBA1C in the last 72 hours. Lipid Profile No results for input(s): CHOL, HDL, LDLCALC, TRIG, CHOLHDL, LDLDIRECT in the last 72 hours. Thyroid function studies No results for input(s): TSH, T4TOTAL, T3FREE, THYROIDAB in the last 72 hours.  Invalid input(s): FREET3 Anemia work up No results for input(s): VITAMINB12, FOLATE, FERRITIN, TIBC, IRON, RETICCTPCT in the last 72 hours. Urinalysis    Component Value Date/Time   COLORURINE YELLOW 04/16/2019 [place] 04/16/2019 0218   LABSPEC 1.009 04/16/2019 8676  PHURINE 6.0 04/16/2019 0218   GLUCOSEU NEGATIVE 04/16/2019 0218   HGBUR LARGE (A)  04/16/2019 0218   BILIRUBINUR NEGATIVE 04/16/2019 [place] 04/16/2019 0218   PROTEINUR NEGATIVE 04/16/2019 0218   UROBILINOGEN 1.0 08/29/2014 2035   NITRITE NEGATIVE 04/16/2019 0218   LEUKOCYTESUR NEGATIVE 04/16/2019 0218   Sepsis Labs Invalid input(s): PROCALCITONIN,  WBC,  LACTICIDVEN Microbiology Recent Results (from the past 240 hour(s))  SARS CORONAVIRUS 2 (TAT 6-24 HRS) Nasopharyngeal Nasopharyngeal Swab     Status: None   Collection Time: 05/05/19  9:42 PM   Specimen: Nasopharyngeal Swab  Result Value Ref Range Status   SARS Coronavirus 2 NEGATIVE NEGATIVE Final    Comment: (NOTE) SARS-CoV-2 target nucleic acids are NOT DETECTED. The SARS-CoV-2 RNA is generally detectable in upper and lower respiratory specimens during the acute phase of infection. Negative results do not preclude SARS-CoV-2 infection, do not rule out co-infections with other pathogens, and should not be used as the sole basis for treatment or other patient management decisions. Negative results must be combined with clinical observations, patient history, and epidemiological information. The expected result is Negative. Fact Sheet for Patients: SugarRoll.be Fact Sheet for Healthcare Providers: https://www.woods-mathews.com/ This test is not yet approved or cleared by the [place] FDA and  has been authorized for detection and/or diagnosis of SARS-CoV-2 by FDA under an Emergency Use Authorization (EUA). This EUA will remain  in effect (meaning this test can be used) for the duration of the COVID-19 declaration under Section 56 4(b)(1) of the Act, 21 U.S.C. section 360bbb-3(b)(1), unless the authorization is terminated or revoked sooner. Performed at [facility], [place] [address], [place], [place] [address]     Procedures/Studies: Dg Chest 2 View  Result Date: 05/05/2019 CLINICAL DATA:  Shortness of breath, worsening lower  extremity edema EXAM: CHEST - 2 VIEW COMPARISON:  Radiograph 09/04/2018 FINDINGS: Stable cardiomegaly with a calcified, tortuous aorta. Pacer/defibrillator pack overlies the left chest wall with leads at the right atrium and cardiac apex. Fine reticular changes throughout the lungs are similar to prior with some more streaky opacity in the lung bases. There is mild central venous congestion without frank edema. No pneumothorax or effusion. Degenerative changes are present in the imaged spine and shoulders. IMPRESSION: Coarse interstitial changes, similar to prior with basilar atelectasis. Central venous congestion and cardiomegaly. No convincing evidence of frank edema. Electronically Signed   By: Lovena Le M.D.   On: 05/05/2019 19:41   Ct Head Wo Contrast  Result Date: 04/15/2019 CLINICAL DATA:  Altered level of consciousness EXAM: CT HEAD WITHOUT CONTRAST TECHNIQUE: Contiguous axial images were obtained from the base of the skull through the vertex without intravenous contrast. COMPARISON:  None. FINDINGS: Brain: No evidence of acute territorial infarction, hemorrhage, hydrocephalus,extra-axial collection or mass lesion/mass effect. There is mild dilatation the ventricles and sulci consistent with age-related atrophy. Low-attenuation changes in the deep white matter consistent with small vessel ischemia. Vascular: No hyperdense vessel or unexpected calcification. Skull: The skull is intact. No fracture or focal lesion identified. Sinuses/Orbits: There is near complete opacification of the frontal sinuses, sphenoid air cells, and bilateral maxillary sinuses. There is also ethmoid air cell mucosal thickening. Other: None IMPRESSION: 1. Findings suggestive sinusitis involving the frontal, sphenoid, bilateral maxillary sinuses. 2. No acute intracranial pathology. 3. Findings consistent with mild age related atrophy and chronic small vessel ischemia Electronically Signed   By: Prudencio Pair M.D.   On: 04/15/2019  21:44   Dg Chest  Portable 1 View  Result Date: 04/15/2019 CLINICAL DATA:  Chest pain, shortness of breath. EXAM: PORTABLE CHEST 1 VIEW COMPARISON:  January 07, 2019. FINDINGS: Stable cardiomegaly. Atherosclerosis of thoracic aorta is noted. No pneumothorax or pleural effusion is noted. Right lung is clear. Minimal left basilar subsegmental atelectasis is noted. Left-sided pacemaker is in grossly good position. Bony thorax is unremarkable. IMPRESSION: Minimal left basilar subsegmental atelectasis. Aortic Atherosclerosis (ICD10-I70.0). Electronically Signed   By: Marijo Conception M.D.   On: 04/15/2019 20:26    Time coordinating discharge: Over 30 minutes  SIGNED:   Guilford Shi, MD  [facility] 05/14/2019, 5:00 PM Pager : [phone]

## 2019-05-14 NOTE — TOC Transition Note (Addendum)
Transition of Care [facility]) - CM/SW Discharge Note   Patient Details  Name: MALKIA NIPPERT MRN: [id number] Date of Birth: 08-06-1945  [facility]) CM/SW Contact:  Zenon Mayo, RN Phone Number: 05/14/2019, 3:23 PM   Clinical Narrative:    Patient for discharge today, she is active with [facility] for [facility], HHTP, will add [place], and [place] aide.  NCM notified Cassie with [facility] she states yes they will add HHOT and HHAIDE.  MD to put orders in.  Soc to begin 24- 48 hrs post dc. Patient also will need rolling walker, referral made to [place] with [facility], he will bring up to patient's room.  Patient states her grandson will transport her home today. Patient states her daughter is off on Friday and will be by to check on her then , she still does not want to go to a SNF.  States she will be fine at home with [facility] services.  NCM received call from [place] with [facility] stating they just gave this patient a walker in September so if she wants another walker she will have to pay 99.00.  NCM informed patient of this information, she states she never took the rolling walker home.  NCM informed Zack to check into, he states he does not see a pick up ticket so he will drop the walker off to patient room.    Final next level of care: [place] Barriers to Discharge: No Barriers Identified   Patient Goals and CMS Choice Patient states their goals for this hospitalization and ongoing recovery are:: get better CMS Medicare.gov Compare Post Acute Care list provided to:: Patient Choice offered to / list presented to : Patient  Discharge Placement                       Discharge Plan and Services   Discharge Planning Services: CM Consult Post Acute Care Choice: Home Health          DME Arranged: Walker rolling DME Agency: [facility] Date DME Agency Contacted: 05/14/19 Time DME Agency Contacted: [phone] Representative spoke with at DME Agency: zack HH Arranged: RN,  Disease Management, PT, OT, Nurse's Aide [place] Agency: [facility] [place] Date [place]: 05/14/19 Time [place]: 1522 Representative spoke with at [place]: [place] ([place]) Interventions     Readmission Risk Interventions Readmission Risk Prevention Plan 05/10/2019 01/24/2019 01/08/2019  Transportation Screening Complete Complete -  PCP or Specialist Appt within 3-5 Days - Complete -  HRI or [place] - Complete Patient refused  Social Work Consult for [place] Planning/Counseling - Complete -  Palliative Care Screening - Not Applicable Not Applicable  Medication Review Press photographer) Complete Complete -  Med Review Comments - - -  PCP or Specialist appointment within 3-5 days of discharge Complete - -  [place] or Home Care Consult Complete - -  SW Recovery Care/Counseling Consult Complete - -  Palliative Care Screening Not Applicable - -  [place] Not Applicable - -  Some recent data might be hidden

## 2019-05-23 ENCOUNTER — Encounter (HOSPITAL_COMMUNITY): Payer: Self-pay | Admitting: Emergency Medicine

## 2019-05-23 ENCOUNTER — Other Ambulatory Visit: Payer: Self-pay

## 2019-05-23 ENCOUNTER — Emergency Department (HOSPITAL_COMMUNITY): Payer: Medicare Other

## 2019-05-23 ENCOUNTER — Emergency Department (HOSPITAL_COMMUNITY)
Admission: EM | Admit: 2019-05-23 | Discharge: 2019-05-23 | Disposition: A | Discharge status: Home / Self Care | Payer: Medicare Other | Attending: Emergency Medicine | Admitting: Emergency Medicine

## 2019-05-23 DIAGNOSIS — I130 Hypertensive heart and chronic kidney disease with heart failure and stage 1 through stage 4 chronic kidney disease, or unspecified chronic kidney disease: Secondary | ICD-10-CM | POA: Diagnosis not present

## 2019-05-23 DIAGNOSIS — R0602 Shortness of breath: Secondary | ICD-10-CM | POA: Diagnosis present

## 2019-05-23 DIAGNOSIS — N184 Chronic kidney disease, stage 4 (severe): Secondary | ICD-10-CM | POA: Insufficient documentation

## 2019-05-23 DIAGNOSIS — Z87891 Personal history of nicotine dependence: Secondary | ICD-10-CM | POA: Insufficient documentation

## 2019-05-23 DIAGNOSIS — Z7901 Long term (current) use of anticoagulants: Secondary | ICD-10-CM | POA: Insufficient documentation

## 2019-05-23 DIAGNOSIS — I5043 Acute on chronic combined systolic (congestive) and diastolic (congestive) heart failure: Secondary | ICD-10-CM | POA: Diagnosis not present

## 2019-05-23 DIAGNOSIS — Z79899 Other long term (current) drug therapy: Secondary | ICD-10-CM | POA: Diagnosis not present

## 2019-05-23 LAB — CBC
HCT: 26 % — ABNORMAL LOW (ref 36.0–46.0)
Hemoglobin: 8.4 g/dL — ABNORMAL LOW (ref 12.0–15.0)
MCH: 26.1 pg (ref 26.0–34.0)
MCHC: 32.3 g/dL (ref 30.0–36.0)
MCV: 80.7 fL (ref 80.0–100.0)
Platelets: 415 10*3/uL — ABNORMAL HIGH (ref 150–400)
RBC: 3.22 MIL/uL — ABNORMAL LOW (ref 3.87–5.11)
RDW: 15.4 % (ref 11.5–15.5)
WBC: 18.5 10*3/uL — ABNORMAL HIGH (ref 4.0–10.5)
nRBC: 0 % (ref 0.0–0.2)

## 2019-05-23 LAB — URINALYSIS, ROUTINE W REFLEX MICROSCOPIC
Bacteria, UA: NONE SEEN
Bilirubin Urine: NEGATIVE
Glucose, UA: NEGATIVE mg/dL
Ketones, ur: NEGATIVE mg/dL
Leukocytes,Ua: NEGATIVE
Nitrite: NEGATIVE
Protein, ur: 30 mg/dL — AB
RBC / HPF: >50 RBC/hpf — ABNORMAL HIGH (ref 0–5)
Specific Gravity, Urine: 1.013 (ref 1.005–1.030)
pH: 6 (ref 5.0–8.0)

## 2019-05-23 LAB — BASIC METABOLIC PANEL
Anion gap: 12 (ref 5–15)
BUN: 28 mg/dL — ABNORMAL HIGH (ref 8–23)
CO2: 25 mmol/L (ref 22–32)
Calcium: 8.9 mg/dL (ref 8.9–10.3)
Chloride: 105 mmol/L (ref 98–111)
Creatinine, Ser: 2.16 mg/dL — ABNORMAL HIGH (ref 0.44–1.00)
GFR calc Af Amer: 25 mL/min — ABNORMAL LOW (ref >60)
GFR calc non Af Amer: 22 mL/min — ABNORMAL LOW (ref >60)
Glucose, Bld: 98 mg/dL (ref 70–99)
Potassium: 3.5 mmol/L (ref 3.5–5.1)
Sodium: 142 mmol/L (ref 135–145)

## 2019-05-23 LAB — TROPONIN I (HIGH SENSITIVITY)
Troponin I (High Sensitivity): 21 ng/L — ABNORMAL HIGH (ref <18)
Troponin I (High Sensitivity): 21 ng/L — ABNORMAL HIGH (ref <18)

## 2019-05-23 LAB — PROTIME-INR
INR: 2.2 — ABNORMAL HIGH (ref 0.8–1.2)
Prothrombin Time: 24.2 seconds — ABNORMAL HIGH (ref 11.4–15.2)

## 2019-05-23 LAB — BRAIN NATRIURETIC PEPTIDE: B Natriuretic Peptide: 1286.3 pg/mL — ABNORMAL HIGH (ref 0.0–100.0)

## 2019-05-23 MED ORDER — SODIUM CHLORIDE 0.9% FLUSH: 3.0000 mL | Freq: Once | INTRAVENOUS | Status: DC

## 2019-05-23 MED ORDER — FUROSEMIDE 10 MG/ML IJ SOLN
80.0000 mg | Freq: Once | INTRAMUSCULAR | Status: AC
  Administered 2019-05-23: 80 mg via INTRAVENOUS
  Filled 2019-05-23: qty 8

## 2019-05-23 NOTE — ED Provider Notes (Signed)
[facility]PART[facility] Provider Note   CSN: [id number] Arrival date & time: 05/23/19  0037     History   Chief Complaint Chief Complaint  Patient presents with  . Shortness of Breath    HPI Stephanie Yates is a 73 y.o. female.     HPI Patient presents with concern of dyspnea and fatigue. Onset is unclear, but likely over the past 3 to 4 days. She notes that she has had worsening fatigue, ongoing swelling in both lower extremities, no new focal pain including head pain, chest pain, abdominal pain. She notes that she also feels somewhat drunk, nauseous, particularly after eating, though again without any abdominal pain. She has been taking her medication regularly, including Lasix, but notes that her urine production has diminished recently. No fever, no cough, no confusion, disorientation, vomiting, diarrhea.  On she is scheduled to see her physician in 4 days.  Past Medical History:  Diagnosis Date  . Chronic anemia   . Chronic systolic CHF (congestive heart failure) ([place])   . CKD (chronic kidney disease), stage IV ([place])   . Former tobacco use   . History of tobacco abuse   . Hyperlipidemia   . Hypertension   . Mitral regurgitation   . Nonischemic cardiomyopathy ([place])    a. EF 10-15% on 11/2012 cath with no significant CAD. b. EF 25% in 2019. b. EF 15% in 12/2018  . PAF (paroxysmal atrial fibrillation) ([place])   . Protein calorie malnutrition ([place])   . Quit consuming alcohol in remote past   . Sinus bradycardia 12/21/2012  . Ventricular tachycardia ([place]) 12/14/2012    Patient Active Problem List   Diagnosis Date Noted  . Torsades de pointes ([place]) 05/14/2019  . Weakness generalized   . Palliative care by specialist   . Gallbladder mass 05/06/2019  . Volume overload 05/06/2019  . Vertigo 04/15/2019  . Abnormal liver function 04/15/2019  . Right adrenal mass ([place]) possible adenoma 03/18/2019  . Nephrolithiasis 03/18/2019  . Prolonged QT  interval 03/17/2019  . Renal failure (ARF), acute on chronic ([place]) 03/17/2019  . Nausea and vomiting 03/17/2019  . Epigastric abdominal pain 01/29/2019  . Gastritis 01/28/2019  . CKD (chronic kidney disease) stage 4, GFR 15-29 ml/min (HCC) 01/28/2019  . NSVT (nonsustained ventricular tachycardia) ([place]) 01/17/2019  . AKI (acute kidney injury) ([place]) 01/12/2019  . DNR (do not resuscitate) discussion   . CHF exacerbation ([place]) 01/06/2019  . Influenza A 09/02/2018  . Lactic acidosis 09/02/2018  . Chronic combined systolic and diastolic congestive heart failure ([place]) 09/02/2018  . Rapid atrial fibrillation ([place]) 06/08/2018  . Acute on chronic combined systolic and diastolic heart failure ([place]) 06/07/2018  . Anemia 06/07/2018  . Acute GI bleeding 06/15/2017  . Chronic anticoagulation   . Acute on chronic congestive heart failure ([place])   . HLD (hyperlipidemia) 03/05/2017  . CKD (chronic kidney disease), stage III 03/05/2017  . Chest pain 03/05/2017  . Symptomatic anemia 03/05/2017  . PAF (paroxysmal atrial fibrillation) ([place]) 04/01/2013  . ICD (implantable cardioverter-defibrillator), dual, st Judes 04/01/2013  . PVC's (premature ventricular contractions) 04/01/2013  . Hypokalemia 12/21/2012  . History of tobacco abuse 12/21/2012  . Transaminitis 12/21/2012  . Sinus bradycardia 12/21/2012  . Hyperkalemia 12/20/2012  . Nonischemic cardiomyopathy ([place]) 12/20/2012  . UTI (urinary tract infection) 12/20/2012  . Acute on chronic combined systolic and diastolic CHF (congestive heart failure) ([place]) 12/20/2012  . Ventricular tachycardia ([place]) 12/14/2012  . Hypertension 12/14/2012    Past  Surgical History:  Procedure Laterality Date  . BIOPSY  01/22/2019   Procedure: BIOPSY;  Surgeon: Wilford Corner, MD;  Location: [facility];  Service: Endoscopy;;  . COLONOSCOPY WITH PROPOFOL N/A 03/19/2019   Procedure: COLONOSCOPY WITH PROPOFOL;  Surgeon: Otis Brace, MD;  Location: [facility];   Service: Gastroenterology;  Laterality: N/A;  . ESOPHAGOGASTRODUODENOSCOPY (EGD) WITH PROPOFOL N/A 06/18/2017   Procedure: ESOPHAGOGASTRODUODENOSCOPY (EGD) WITH PROPOFOL;  Surgeon: Ronnette Juniper, MD;  Location: [facility];  Service: Gastroenterology;  Laterality: N/A;  . ESOPHAGOGASTRODUODENOSCOPY (EGD) WITH PROPOFOL N/A 01/22/2019   Procedure: ESOPHAGOGASTRODUODENOSCOPY (EGD) WITH PROPOFOL;  Surgeon: Wilford Corner, MD;  Location: [facility];  Service: Endoscopy;  Laterality: N/A;  . ESOPHAGOGASTRODUODENOSCOPY (EGD) WITH PROPOFOL N/A 03/19/2019   Procedure: ESOPHAGOGASTRODUODENOSCOPY (EGD) WITH PROPOFOL;  Surgeon: Otis Brace, MD;  Location: [facility] ENDOSCOPY;  Service: Gastroenterology;  Laterality: N/A;  . IMPLANTABLE CARDIOVERTER DEFIBRILLATOR IMPLANT  12/19/2012   St. Jude dual-chamber ICD, serial number [id number]  . IMPLANTABLE CARDIOVERTER DEFIBRILLATOR IMPLANT N/A 12/19/2012   Procedure: IMPLANTABLE CARDIOVERTER DEFIBRILLATOR IMPLANT;  Surgeon: Deboraha Sprang, MD;  Location: [facility] CATH LAB;  Service: Cardiovascular;  Laterality: N/A;  . LEFT AND RIGHT HEART CATHETERIZATION WITH CORONARY ANGIOGRAM N/A 12/17/2012   Procedure: LEFT AND RIGHT HEART CATHETERIZATION WITH CORONARY ANGIOGRAM;  Surgeon: Sherren Mocha, MD;  Location: [facility] CATH LAB;  Service: Cardiovascular;  Laterality: N/A;  . POLYPECTOMY  03/19/2019   Procedure: POLYPECTOMY;  Surgeon: Otis Brace, MD;  [facility] END[facility];  Service: Gastroenterology;;  . SCLEROTHERAPY  01/22/2019   Procedure: Clide Deutscher;  Surgeon: Wilford Corner, MD;  Location: [facility] ENDOSCOPY;  Service: Endoscopy;;     OB History   No obstetric history on file.      Home Medications    Prior to Admission medications   Medication Sig Start Date End Date Taking? Authorizing Provider  acetaminophen (TYLENOL) 500 MG tablet Take 1,000 mg by mouth every 6 (six) hours as needed (pain).    [provider]  albuterol (VENTOLIN HFA) 108 (90 Base)  MCG/ACT inhaler Inhale 2 puffs into the lungs every 4 (four) hours as needed for wheezing or shortness of breath.    [provider]  amiodarone (PACERONE) 200 MG tablet Take 1 tablet (200 mg total) by mouth 2 (two) times daily. 05/14/19 06/13/19  Guilford Shi, MD  apixaban (ELIQUIS) 2.5 MG TABS tablet Take 1 tablet (2.5 mg total) by mouth 2 (two) times daily. 03/19/19   Debbe Odea, MD  atorvastatin (LIPITOR) 40 MG tablet Take 1 tablet (40 mg total) by mouth daily at 6 PM. 07/05/18   Deboraha Sprang, MD  carvedilol (COREG) 3.125 MG tablet Take 1 tablet (3.125 mg total) by mouth 2 (two) times daily with a meal. 09/12/18   Deboraha Sprang, MD  Cholecalciferol (VITAMIN D-3) 25 MCG (1000 UT) CAPS Take 1,000 Units by mouth at bedtime.     [provider]  Ensure (ENSURE) Take 237 mLs by mouth daily.    [provider]  ferrous gluconate (FERGON) 324 MG tablet Take 324 mg by mouth 2 (two) times daily.  07/05/18   [provider]  furosemide (LASIX) 80 MG tablet Take 0.5 tablets (40 mg total) by mouth daily. 04/19/19   Nita Sells, MD  hydrALAZINE (APRESOLINE) 25 MG tablet Take 0.5 tablets (12.5 mg total) by mouth 3 (three) times daily. 01/12/19   Guilford Shi, MD  magnesium oxide (MAGNESIUM-OXIDE) 400 (241.3 Mg) MG tablet Take 1 tablet (400 mg total) by mouth daily. 05/14/19  Guilford Shi, MD  mexiletine (MEXITIL) 200 MG capsule Take 1 capsule (200 mg total) by mouth 2 (two) times daily. 01/24/19   [name], PA-C  nitroGLYCERIN (NITROSTAT) 0.4 MG SL tablet Place 1 tablet (0.4 mg total) under the tongue every 5 (five) minutes as needed for chest pain. 01/12/19   Guilford Shi, MD  ondansetron (ZOFRAN ODT) 4 MG disintegrating tablet Take 1 tablet (4 mg total) by mouth every 8 (eight) hours as needed for nausea or vomiting. Patient not taking: Reported on 05/05/2019 03/21/19   [place] Donnamarie Poag, DO  pantoprazole (PROTONIX) 40 MG tablet Take 1  tablet (40 mg total) by mouth daily. 04/05/19 07/04/19  [place] Donnamarie Poag, DO  polyethylene glycol (MIRALAX / GLYCOLAX) 17 g packet Take 17 g by mouth daily. Can take up to twice a day for constipation. Patient taking differently: Take 17 g by mouth daily as needed (constipation).  03/19/19   Debbe Odea, MD  senna-docusate (SENOKOT-S) 8.6-50 MG tablet Take 1 tablet by mouth at bedtime as needed for mild constipation. 05/10/19   Charolotte Capuchin, MD  sucralfate (CARAFATE) 1 GM/10ML suspension Take 10 mLs (1 g total) by mouth 4 (four) times daily -  with meals and at bedtime. 03/19/19   Debbe Odea, MD    Family History Family History  Problem Relation Age of Onset  . Cancer Mother     Social History Social History   Tobacco Use  . Smoking status: Former Research scientist (life sciences)  . Smokeless tobacco: Never Used  Substance Use Topics  . Alcohol use: No  . Drug use: No     Allergies   Strawberry extract   Review of Systems Review of Systems  Constitutional:       Per HPI, otherwise negative  HENT:       Per HPI, otherwise negative  Respiratory:       Per HPI, otherwise negative  Cardiovascular:       Per HPI, otherwise negative  Gastrointestinal: Negative for vomiting.  Endocrine:       Negative aside from HPI  Genitourinary:       Neg aside from HPI   Musculoskeletal:       Per HPI, otherwise negative  Skin: Negative.   Neurological: Positive for weakness. Negative for syncope.     Physical Exam Updated Vital Signs BP (!) 134/55   Pulse 60   Temp 98.2 F (36.8 C) (Oral)   Resp 13   SpO2 94%   Physical Exam Vitals signs and nursing note reviewed.  Constitutional:      General: She is not in acute distress.    Appearance: She is well-developed.     Comments: Sickly appearing elderly female awake alert sitting upright.  HENT:     Head: Normocephalic and atraumatic.  Eyes:     Conjunctiva/sclera: Conjunctivae normal.  Cardiovascular:     Rate and Rhythm: Normal rate and  regular rhythm.  Pulmonary:     Effort: Pulmonary effort is normal. No respiratory distress.     Breath sounds: Normal breath sounds. No stridor. No wheezing.  Abdominal:     General: There is no distension.  Musculoskeletal:     Right lower leg: She exhibits no tenderness. Edema present.     Left lower leg: She exhibits no tenderness. Edema present.  Skin:    General: Skin is warm and dry.  Neurological:     Mental Status: She is alert and oriented to person, place, and time.  Cranial Nerves: No cranial nerve deficit.      ED Treatments / Results  Labs (all labs ordered are listed, but only abnormal results are displayed) Labs Reviewed  BASIC METABOLIC PANEL - Abnormal; Notable for the following components:      Result Value   BUN 28 (*)    Creatinine, Ser 2.16 (*)    GFR calc non Af Amer 22 (*)    GFR calc Af Amer 25 (*)    All other components within normal limits  CBC - Abnormal; Notable for the following components:   WBC 18.5 (*)    RBC 3.22 (*)    Hemoglobin 8.4 (*)    HCT 26.0 (*)    Platelets 415 (*)    All other components within normal limits  PROTIME-INR - Abnormal; Notable for the following components:   Prothrombin Time 24.2 (*)    INR 2.2 (*)    All other components within normal limits  URINALYSIS, ROUTINE W REFLEX MICROSCOPIC - Abnormal; Notable for the following components:   APPearance HAZY (*)    Hgb urine dipstick LARGE (*)    Protein, ur 30 (*)    RBC / HPF >50 (*)    All other components within normal limits  BRAIN NATRIURETIC PEPTIDE - Abnormal; Notable for the following components:   B Natriuretic Peptide 1,286.3 (*)    All other components within normal limits  TROPONIN I (HIGH SENSITIVITY) - Abnormal; Notable for the following components:   Troponin I (High Sensitivity) 21 (*)    All other components within normal limits  TROPONIN I (HIGH SENSITIVITY) - Abnormal; Notable for the following components:   Troponin I (High Sensitivity) 21  (*)    All other components within normal limits    EKG EKG Interpretation  Date/Time:  Thursday May 23 2019 07:40:17 EST Ventricular Rate:  63 PR Interval:    QRS Duration: 152 QT Interval:  514 QTC Calculation: 527 R Axis:   -14 Text Interpretation: Sinus rhythm Prolonged PR interval Probable left atrial enlargement Left bundle branch block Abnormal ECG Confirmed by Carmin Muskrat [phone]) on 05/23/2019 8:50:38 AM   Radiology Dg Chest 2 View  Result Date: 05/23/2019 CLINICAL DATA:  Shortness of breath EXAM: CHEST - 2 VIEW COMPARISON:  Radiograph 05/05/2019 FINDINGS: Cardiomegaly is similar to slightly increased from comparison AP radiography. Pacer/defibrillator pack overlies the left chest wall leads in the cardiac apex and right atrium. There is a mildly increased interstitial opacity with fissural and septal thickening and indistinct pulmonary vascularity. No consolidative opacity. Atelectatic changes in the lung bases. No visible pneumothorax or effusion. The osseous structures appear diffusely demineralized which may limit detection of small or nondisplaced fractures. No acute osseous or soft tissue abnormality. Degenerative changes are present in the imaged spine and shoulders. IMPRESSION: 1. Cardiomegaly with mild interstitial pulmonary edema, similar to slightly increased from prior study. 2. Bibasilar atelectasis. No consolidation. Electronically Signed   By: Lovena Le M.D.   On: 05/23/2019 01:17    Procedures Procedures (including critical care time)  Medications Ordered in ED Medications  sodium chloride flush (NS) 0.9 % injection 3 mL (has no administration in time range)  furosemide (LASIX) injection 80 mg (80 mg Intravenous Given 05/23/19 0809)     Initial Impression / Assessment and Plan / ED Course  I have reviewed the triage vital signs and the nursing notes.  Pertinent labs & imaging results that were available during my care of the patient were  reviewed  by me and considered in my medical decision making (see chart for details).    Chart review notable for history of multiple medical issues including arrhythmia, pacemaker placement, chronic kidney disease, CHF. Echocardiogram from earlier this year with results as below: FINDINGS  Left Ventricle: The left ventricle has a visually estimated ejection fraction of 15. The cavity size was mildly dilated. There is no increase in left ventricular wall thickness. Left ventricular diastolic Doppler parameters are consistent with impaired  relaxation. Left ventricular diffuse hypokinesis.   Right Ventricle: The right ventricle has normal systolic function. The cavity was normal. There is no increase in right ventricular wall thickness. Pacing wire/catheter visualized in the right ventricle.   Left Atrium: Left atrial size was moderately dilated.   Right Atrium: Right atrial size was normal in size. Right atrial pressure is estimated at 3 mmHg.   Interatrial Septum: No atrial level shunt detected by color flow Doppler.   Pericardium: Trivial pericardial effusion is present.   Mitral Valve: The mitral valve is degenerative in appearance. Mild calcification of the mitral valve leaflet. There is moderate mitral annular calcification present. Mitral valve regurgitation is mild to moderate by color flow Doppler. No evidence of  mitral valve stenosis.   Tricuspid Valve: The tricuspid valve is normal in structure. Tricuspid valve regurgitation is trivial by color flow Doppler.   Aortic Valve: The aortic valve is tricuspid Mild calcification of the aortic valve. Aortic valve regurgitation was not visualized by color flow Doppler. There is No stenosis of the aortic valve.   Pulmonic Valve: The pulmonic valve was not assessed. Pulmonic valve regurgitation is not visualized by color flow Doppler.   Venous: The inferior vena cava is normal in size with greater than 50% respiratory variability.     After the initial evaluation with consideration of the patient's oxygen status, patient had cessation of supplemental oxygen, with oxygen saturation 95%.  Initial findings concerning for elevated BNP, slightly worsened renal function. She had had a lengthy conversation about these findings, delicate balance of therapy for heart failure and maintaining kidney function. Patient will receive IV Lasix, is adamant about returning home. 10:12 AM Patient has had production of substantial amounts of urine. She has no oxygen requirement, is hemodynamically unremarkable. She and I again had a lengthy conversation about her congestive heart failure, concern for acute exacerbation, complicated by worsening renal function. Patient is adamant about going home, though I advocated for hospital admission. She states that she wants to follow-up with her physician in 4 days as scheduled. I discussed the importance of discussing her medication regimen with him, particularly given concern for today's episode. As she has produced substantial urine, has no oxygen requirement, hemodynamically unremarkable, her request for discharge was accommodated, though after a conversation on return precautions as well.  Final Clinical Impressions(s) / ED Diagnoses   Final diagnoses:  Acute on chronic combined systolic and diastolic congestive heart failure ([place])     Carmin Muskrat, MD 05/23/19 1014

## 2019-05-23 NOTE — ED Notes (Signed)
Iv removed from L forearm; iv site clean, dry, intact

## 2019-05-23 NOTE — ED Triage Notes (Signed)
Patient arrived with EMS from home reports worsening SOB onset yesterday with mild dizziness , patient added right flank pain with mild hematuria , denies fever or chills , history of CHF with bilateral lower legs edema .

## 2019-05-23 NOTE — ED Notes (Signed)
Patient verbalizes understanding of discharge instructions. Opportunity for questioning and answers were provided. Pt discharged from ED. 

## 2019-05-23 NOTE — Discharge Instructions (Addendum)
As discussed, it is very important that you follow-up with your physician as scheduled in 4 days. In particular, it is important that you discuss your medication regimen, and today's emergency department evaluation.  Return here for concerning changes in your condition.

## 2019-05-26 ENCOUNTER — Encounter (HOSPITAL_COMMUNITY): Payer: Self-pay | Admitting: Pharmacy Technician

## 2019-05-26 ENCOUNTER — Inpatient Hospital Stay (HOSPITAL_COMMUNITY)
Admission: EM | Admit: 2019-05-26 | Discharge: 2019-06-07 | DRG: 286 | Disposition: A | Discharge status: Skilled Nursing Facility | Payer: Medicare Other | Attending: Family Medicine | Admitting: Family Medicine

## 2019-05-26 ENCOUNTER — Emergency Department (HOSPITAL_COMMUNITY): Payer: Medicare Other

## 2019-05-26 ENCOUNTER — Other Ambulatory Visit: Payer: Self-pay

## 2019-05-26 DIAGNOSIS — G8194 Hemiplegia, unspecified affecting left nondominant side: Secondary | ICD-10-CM | POA: Diagnosis not present

## 2019-05-26 DIAGNOSIS — D649 Anemia, unspecified: Secondary | ICD-10-CM | POA: Diagnosis present

## 2019-05-26 DIAGNOSIS — Z8711 Personal history of peptic ulcer disease: Secondary | ICD-10-CM

## 2019-05-26 DIAGNOSIS — R001 Bradycardia, unspecified: Secondary | ICD-10-CM | POA: Diagnosis present

## 2019-05-26 DIAGNOSIS — K4090 Unilateral inguinal hernia, without obstruction or gangrene, not specified as recurrent: Secondary | ICD-10-CM | POA: Diagnosis present

## 2019-05-26 DIAGNOSIS — K922 Gastrointestinal hemorrhage, unspecified: Secondary | ICD-10-CM

## 2019-05-26 DIAGNOSIS — Z6826 Body mass index (BMI) 26.0-26.9, adult: Secondary | ICD-10-CM

## 2019-05-26 DIAGNOSIS — R270 Ataxia, unspecified: Secondary | ICD-10-CM | POA: Diagnosis not present

## 2019-05-26 DIAGNOSIS — N179 Acute kidney failure, unspecified: Secondary | ICD-10-CM

## 2019-05-26 DIAGNOSIS — I509 Heart failure, unspecified: Secondary | ICD-10-CM

## 2019-05-26 DIAGNOSIS — I480 Paroxysmal atrial fibrillation: Secondary | ICD-10-CM | POA: Diagnosis present

## 2019-05-26 DIAGNOSIS — Z7189 Other specified counseling: Secondary | ICD-10-CM

## 2019-05-26 DIAGNOSIS — N184 Chronic kidney disease, stage 4 (severe): Secondary | ICD-10-CM | POA: Diagnosis present

## 2019-05-26 DIAGNOSIS — K7290 Hepatic failure, unspecified without coma: Secondary | ICD-10-CM | POA: Diagnosis not present

## 2019-05-26 DIAGNOSIS — I472 Ventricular tachycardia: Secondary | ICD-10-CM | POA: Diagnosis present

## 2019-05-26 DIAGNOSIS — H538 Other visual disturbances: Secondary | ICD-10-CM | POA: Diagnosis not present

## 2019-05-26 DIAGNOSIS — H5461 Unqualified visual loss, right eye, normal vision left eye: Secondary | ICD-10-CM | POA: Diagnosis present

## 2019-05-26 DIAGNOSIS — Z95810 Presence of automatic (implantable) cardiac defibrillator: Secondary | ICD-10-CM

## 2019-05-26 DIAGNOSIS — D72829 Elevated white blood cell count, unspecified: Secondary | ICD-10-CM | POA: Diagnosis present

## 2019-05-26 DIAGNOSIS — E785 Hyperlipidemia, unspecified: Secondary | ICD-10-CM | POA: Diagnosis present

## 2019-05-26 DIAGNOSIS — J9811 Atelectasis: Secondary | ICD-10-CM | POA: Diagnosis present

## 2019-05-26 DIAGNOSIS — Z87442 Personal history of urinary calculi: Secondary | ICD-10-CM

## 2019-05-26 DIAGNOSIS — F1011 Alcohol abuse, in remission: Secondary | ICD-10-CM | POA: Diagnosis present

## 2019-05-26 DIAGNOSIS — R0902 Hypoxemia: Secondary | ICD-10-CM | POA: Diagnosis not present

## 2019-05-26 DIAGNOSIS — I5023 Acute on chronic systolic (congestive) heart failure: Secondary | ICD-10-CM | POA: Diagnosis present

## 2019-05-26 DIAGNOSIS — R188 Other ascites: Secondary | ICD-10-CM

## 2019-05-26 DIAGNOSIS — E440 Moderate protein-calorie malnutrition: Secondary | ICD-10-CM | POA: Diagnosis present

## 2019-05-26 DIAGNOSIS — G8324 Monoplegia of upper limb affecting left nondominant side: Secondary | ICD-10-CM | POA: Diagnosis not present

## 2019-05-26 DIAGNOSIS — I2510 Atherosclerotic heart disease of native coronary artery without angina pectoris: Secondary | ICD-10-CM | POA: Diagnosis present

## 2019-05-26 DIAGNOSIS — D62 Acute posthemorrhagic anemia: Secondary | ICD-10-CM | POA: Diagnosis present

## 2019-05-26 DIAGNOSIS — I959 Hypotension, unspecified: Secondary | ICD-10-CM | POA: Diagnosis not present

## 2019-05-26 DIAGNOSIS — R1084 Generalized abdominal pain: Secondary | ICD-10-CM

## 2019-05-26 DIAGNOSIS — L8990 Pressure ulcer of unspecified site, unspecified stage: Secondary | ICD-10-CM | POA: Insufficient documentation

## 2019-05-26 DIAGNOSIS — E876 Hypokalemia: Secondary | ICD-10-CM | POA: Diagnosis present

## 2019-05-26 DIAGNOSIS — R29709 NIHSS score 9: Secondary | ICD-10-CM | POA: Diagnosis not present

## 2019-05-26 DIAGNOSIS — R471 Dysarthria and anarthria: Secondary | ICD-10-CM | POA: Diagnosis not present

## 2019-05-26 DIAGNOSIS — Z515 Encounter for palliative care: Secondary | ICD-10-CM | POA: Diagnosis not present

## 2019-05-26 DIAGNOSIS — I130 Hypertensive heart and chronic kidney disease with heart failure and stage 1 through stage 4 chronic kidney disease, or unspecified chronic kidney disease: Secondary | ICD-10-CM | POA: Diagnosis not present

## 2019-05-26 DIAGNOSIS — I428 Other cardiomyopathies: Secondary | ICD-10-CM | POA: Diagnosis present

## 2019-05-26 DIAGNOSIS — I5043 Acute on chronic combined systolic (congestive) and diastolic (congestive) heart failure: Secondary | ICD-10-CM

## 2019-05-26 DIAGNOSIS — L89321 Pressure ulcer of left buttock, stage 1: Secondary | ICD-10-CM | POA: Diagnosis present

## 2019-05-26 DIAGNOSIS — K5900 Constipation, unspecified: Secondary | ICD-10-CM | POA: Diagnosis not present

## 2019-05-26 DIAGNOSIS — R54 Age-related physical debility: Secondary | ICD-10-CM | POA: Diagnosis present

## 2019-05-26 DIAGNOSIS — L89322 Pressure ulcer of left buttock, stage 2: Secondary | ICD-10-CM | POA: Diagnosis not present

## 2019-05-26 DIAGNOSIS — Z87891 Personal history of nicotine dependence: Secondary | ICD-10-CM

## 2019-05-26 DIAGNOSIS — Z20828 Contact with and (suspected) exposure to other viral communicable diseases: Secondary | ICD-10-CM | POA: Diagnosis present

## 2019-05-26 DIAGNOSIS — I5021 Acute systolic (congestive) heart failure: Secondary | ICD-10-CM | POA: Diagnosis present

## 2019-05-26 DIAGNOSIS — R1013 Epigastric pain: Secondary | ICD-10-CM | POA: Diagnosis present

## 2019-05-26 DIAGNOSIS — Z7901 Long term (current) use of anticoagulants: Secondary | ICD-10-CM

## 2019-05-26 DIAGNOSIS — I5030 Unspecified diastolic (congestive) heart failure: Secondary | ICD-10-CM

## 2019-05-26 DIAGNOSIS — E875 Hyperkalemia: Secondary | ICD-10-CM | POA: Diagnosis not present

## 2019-05-26 DIAGNOSIS — Z79899 Other long term (current) drug therapy: Secondary | ICD-10-CM

## 2019-05-26 DIAGNOSIS — J329 Chronic sinusitis, unspecified: Secondary | ICD-10-CM | POA: Diagnosis present

## 2019-05-26 DIAGNOSIS — R9431 Abnormal electrocardiogram [ECG] [EKG]: Secondary | ICD-10-CM | POA: Diagnosis present

## 2019-05-26 DIAGNOSIS — L89311 Pressure ulcer of right buttock, stage 1: Secondary | ICD-10-CM | POA: Diagnosis present

## 2019-05-26 LAB — COMPREHENSIVE METABOLIC PANEL
ALT: 39 U/L (ref 0–44)
AST: 85 U/L — ABNORMAL HIGH (ref 15–41)
Albumin: 2.2 g/dL — ABNORMAL LOW (ref 3.5–5.0)
Alkaline Phosphatase: 98 U/L (ref 38–126)
Anion gap: 10 (ref 5–15)
BUN: 30 mg/dL — ABNORMAL HIGH (ref 8–23)
CO2: 26 mmol/L (ref 22–32)
Calcium: 8.6 mg/dL — ABNORMAL LOW (ref 8.9–10.3)
Chloride: 105 mmol/L (ref 98–111)
Creatinine, Ser: 2.08 mg/dL — ABNORMAL HIGH (ref 0.44–1.00)
GFR calc Af Amer: 26 mL/min — ABNORMAL LOW (ref >60)
GFR calc non Af Amer: 23 mL/min — ABNORMAL LOW (ref >60)
Glucose, Bld: 96 mg/dL (ref 70–99)
Potassium: 3.1 mmol/L — ABNORMAL LOW (ref 3.5–5.1)
Sodium: 141 mmol/L (ref 135–145)
Total Bilirubin: 1.2 mg/dL (ref 0.3–1.2)
Total Protein: 7 g/dL (ref 6.5–8.1)

## 2019-05-26 LAB — CBC
HCT: 23.2 % — ABNORMAL LOW (ref 36.0–46.0)
Hemoglobin: 7.7 g/dL — ABNORMAL LOW (ref 12.0–15.0)
MCH: 26.4 pg (ref 26.0–34.0)
MCHC: 33.2 g/dL (ref 30.0–36.0)
MCV: 79.5 fL — ABNORMAL LOW (ref 80.0–100.0)
Platelets: 313 10*3/uL (ref 150–400)
RBC: 2.92 MIL/uL — ABNORMAL LOW (ref 3.87–5.11)
RDW: 15.2 % (ref 11.5–15.5)
WBC: 16 10*3/uL — ABNORMAL HIGH (ref 4.0–10.5)
nRBC: 0 % (ref 0.0–0.2)

## 2019-05-26 LAB — LIPASE, BLOOD: Lipase: 28 U/L (ref 11–51)

## 2019-05-26 LAB — LACTIC ACID, PLASMA: Lactic Acid, Venous: 1.5 mmol/L (ref 0.5–1.9)

## 2019-05-26 LAB — POC OCCULT BLOOD, ED: Fecal Occult Bld: POSITIVE — AB

## 2019-05-26 LAB — BRAIN NATRIURETIC PEPTIDE: B Natriuretic Peptide: 1587.2 pg/mL — ABNORMAL HIGH (ref 0.0–100.0)

## 2019-05-26 MED ORDER — MAGNESIUM OXIDE 400 (241.3 MG) MG PO TABS: 400.0000 mg | ORAL_TABLET | Freq: Every day | ORAL | Status: DC

## 2019-05-26 MED ORDER — ACETAMINOPHEN 325 MG PO TABS: 650.0000 mg | ORAL_TABLET | Freq: Four times a day (QID) | ORAL | Status: DC | PRN

## 2019-05-26 MED ORDER — METOCLOPRAMIDE HCL 5 MG/ML IJ SOLN
10.0000 mg | Freq: Once | INTRAMUSCULAR | Status: AC
  Administered 2019-05-26: 10 mg via INTRAVENOUS
  Filled 2019-05-26: qty 2

## 2019-05-26 MED ORDER — ATORVASTATIN CALCIUM 40 MG PO TABS: 40.0000 mg | ORAL_TABLET | Freq: Every day | ORAL | Status: DC

## 2019-05-26 MED ORDER — FENTANYL CITRATE (PF) 100 MCG/2ML IJ SOLN
25.0000 ug | Freq: Once | INTRAMUSCULAR | Status: AC
  Administered 2019-05-26: 25 ug via INTRAVENOUS
  Filled 2019-05-26: qty 2

## 2019-05-26 MED ORDER — AMIODARONE HCL 200 MG PO TABS: 200.0000 mg | ORAL_TABLET | Freq: Two times a day (BID) | ORAL | Status: DC

## 2019-05-26 MED ORDER — MEXILETINE HCL 200 MG PO CAPS: 200.0000 mg | ORAL_CAPSULE | Freq: Two times a day (BID) | ORAL | Status: DC

## 2019-05-26 MED ORDER — CARVEDILOL 3.125 MG PO TABS
3.1250 mg | ORAL_TABLET | Freq: Two times a day (BID) | ORAL | Status: DC
  Filled 2019-05-26: qty 1

## 2019-05-26 MED ORDER — PANTOPRAZOLE SODIUM 40 MG PO TBEC: 40.0000 mg | DELAYED_RELEASE_TABLET | Freq: Every day | ORAL | Status: DC

## 2019-05-26 MED ORDER — SUCRALFATE 1 GM/10ML PO SUSP: 1.0000 g | Freq: Three times a day (TID) | ORAL | Status: DC

## 2019-05-26 MED ORDER — SODIUM CHLORIDE 0.9% FLUSH
3.0000 mL | Freq: Once | INTRAVENOUS | Status: AC
  Administered 2019-05-26: 3 mL via INTRAVENOUS

## 2019-05-26 MED ORDER — POLYETHYLENE GLYCOL 3350 17 G PO PACK: 17.0000 g | PACK | Freq: Every day | ORAL | Status: DC | PRN

## 2019-05-26 MED ORDER — FUROSEMIDE 10 MG/ML IJ SOLN
40.0000 mg | Freq: Once | INTRAMUSCULAR | Status: AC
  Administered 2019-05-26: 40 mg via INTRAVENOUS
  Filled 2019-05-26: qty 4

## 2019-05-26 MED ORDER — HYDRALAZINE HCL 25 MG PO TABS: 12.5000 mg | ORAL_TABLET | Freq: Three times a day (TID) | ORAL | Status: DC

## 2019-05-26 MED ORDER — FERROUS GLUCONATE 324 (38 FE) MG PO TABS: 324.0000 mg | ORAL_TABLET | Freq: Two times a day (BID) | ORAL | Status: DC

## 2019-05-26 MED ORDER — NITROGLYCERIN 0.4 MG SL SUBL: 0.4000 mg | SUBLINGUAL_TABLET | SUBLINGUAL | Status: DC | PRN

## 2019-05-26 MED ORDER — ACETAMINOPHEN 650 MG RE SUPP: 650.0000 mg | Freq: Four times a day (QID) | RECTAL | Status: DC | PRN

## 2019-05-26 NOTE — ED Notes (Signed)
When I came into room The Pt. Was finishing 3 packs of saltine cracker. Diet specifies  NPO. She said someone came and gave her the crackers but did not know who.

## 2019-05-26 NOTE — ED Provider Notes (Signed)
[facility] EMERGENCY DEPARTMENT Provider Note   CSN: [id number] Arrival date & time: 05/26/19  1531     History   Chief Complaint Chief Complaint  Patient presents with   Abdominal Pain    HPI Stephanie Yates is a 73 y.o. female.     Patient is a 73 year old female with numerous medical problems including CHF with cardiomyopathy and EF of 10 to 15%, nonsustained V. tach, paroxysmal atrial fibrillation on 2 antiarrhythmics, chronically anticoagulated, prior acute GI bleeding and anemia who is presenting today with abdominal pain.  She states it started approximately 5 days ago and has been coming and going but worse today.  She has had severe nausea and states that the pain is worse when she tries to eat anything.  She has been straining to have bowel movements and her stool has seemed hard but she denies persistent black stool or any hematemesis.  Patient did come to the emergency room several days ago but states the wait was too long and she decided to go home.  However things are not getting better at home.  She was recently hospitalized for CHF exacerbation and states she continues to take her diuretics but her leg still seems swollen.  She does not check her weight regularly.  She denies fever, cough but does have some shortness of breath.  She denies any chest pain.  The history is provided by the patient.  Abdominal Pain Pain location:  RUQ and RLQ Pain quality: gnawing, sharp and shooting   Pain radiates to:  Does not radiate Pain severity:  Severe Onset quality:  Gradual Duration:  5 days Timing:  Intermittent Progression:  Worsening Chronicity:  New   Past Medical History:  Diagnosis Date   Chronic anemia    Chronic systolic CHF (congestive heart failure) (HCC)    CKD (chronic kidney disease), stage IV (HCC)    Former tobacco use    History of tobacco abuse    Hyperlipidemia    Hypertension    Mitral regurgitation    Nonischemic  cardiomyopathy ([place])    a. EF 10-15% on 11/2012 cath with no significant CAD. b. EF 25% in 2019. b. EF 15% in 12/2018   PAF (paroxysmal atrial fibrillation) (HCC)    Protein calorie malnutrition ([place])    Quit consuming alcohol in remote past    Sinus bradycardia 12/21/2012   Ventricular tachycardia ([place]) 12/14/2012    Patient Active Problem List   Diagnosis Date Noted   Torsades de pointes ([place]) 05/14/2019   Weakness generalized    Palliative care by specialist    Gallbladder mass 05/06/2019   Volume overload 05/06/2019   Vertigo 04/15/2019   Abnormal liver function 04/15/2019   Right adrenal mass ([place]) possible adenoma 03/18/2019   Nephrolithiasis 03/18/2019   Prolonged QT interval 03/17/2019   Renal failure (ARF), acute on chronic (HCC) 03/17/2019   Nausea and vomiting 03/17/2019   Epigastric abdominal pain 01/29/2019   Gastritis 01/28/2019   CKD (chronic kidney disease) stage 4, GFR 15-29 ml/min (HCC) 01/28/2019   NSVT (nonsustained ventricular tachycardia) ([place]) 01/17/2019   AKI (acute kidney injury) ([place]) 01/12/2019   DNR (do not resuscitate) discussion    CHF exacerbation ([place]) 01/06/2019   Influenza A 09/02/2018   Lactic acidosis 09/02/2018   Chronic combined systolic and diastolic congestive heart failure ([place]) 09/02/2018   Rapid atrial fibrillation ([place]) 06/08/2018   Acute on chronic combined systolic and diastolic heart failure ([place]) 06/07/2018   Anemia 06/07/2018  Acute GI bleeding 06/15/2017   Chronic anticoagulation    Acute on chronic congestive heart failure (HCC)    HLD (hyperlipidemia) 03/05/2017   CKD (chronic kidney disease), stage III 03/05/2017   Chest pain 03/05/2017   Symptomatic anemia 03/05/2017   PAF (paroxysmal atrial fibrillation) ([place]) 04/01/2013   ICD (implantable cardioverter-defibrillator), dual, st Judes 04/01/2013   PVC's (premature ventricular contractions) 04/01/2013   Hypokalemia 12/21/2012    History of tobacco abuse 12/21/2012   Transaminitis 12/21/2012   Sinus bradycardia 12/21/2012   Hyperkalemia 12/20/2012   Nonischemic cardiomyopathy ([place]) 12/20/2012   UTI (urinary tract infection) 12/20/2012   Acute on chronic combined systolic and diastolic CHF (congestive heart failure) ([place]) 12/20/2012   Ventricular tachycardia ([place]) 12/14/2012   Hypertension 12/14/2012    Past Surgical History:  Procedure Laterality Date   BIOPSY  01/22/2019   Procedure: BIOPSY;  Surgeon: Wilford Corner, MD;  Location: [facility];  Service: Endoscopy;;   COLONOSCOPY WITH PROPOFOL N/A 03/19/2019   Procedure: COLONOSCOPY WITH PROPOFOL;  Surgeon: Otis Brace, MD;  Location: [facility] ENDOSCOPY;  Service: Gastroenterology;  Laterality: N/A;   ESOPHAGOGASTRODUODENOSCOPY (EGD) WITH PROPOFOL N/A 06/18/2017   Procedure: ESOPHAGOGASTRODUODENOSCOPY (EGD) WITH PROPOFOL;  Surgeon: Ronnette Juniper, MD;  Location: [facility];  Service: Gastroenterology;  Laterality: N/A;   ESOPHAGOGASTRODUODENOSCOPY (EGD) WITH PROPOFOL N/A 01/22/2019   Procedure: ESOPHAGOGASTRODUODENOSCOPY (EGD) WITH PROPOFOL;  Surgeon: Wilford Corner, MD;  Location: [facility];  Service: Endoscopy;  Laterality: N/A;   ESOPHAGOGASTRODUODENOSCOPY (EGD) WITH PROPOFOL N/A 03/19/2019   Procedure: ESOPHAGOGASTRODUODENOSCOPY (EGD) WITH PROPOFOL;  Surgeon: Otis Brace, MD;  Location: [facility];  Service: Gastroenterology;  Laterality: N/A;   IMPLANTABLE CARDIOVERTER DEFIBRILLATOR IMPLANT  12/19/2012   St. Jude dual-chamber ICD, serial number [id number]   IMPLANTABLE CARDIOVERTER DEFIBRILLATOR IMPLANT N/A 12/19/2012   Procedure: IMPLANTABLE CARDIOVERTER DEFIBRILLATOR IMPLANT;  Surgeon: Deboraha Sprang, MD;  Location: [facility] CATH LAB;  Service: Cardiovascular;  Laterality: N/A;   LEFT AND RIGHT HEART CATHETERIZATION WITH CORONARY ANGIOGRAM N/A 12/17/2012   Procedure: LEFT AND RIGHT HEART CATHETERIZATION WITH CORONARY ANGIOGRAM;  Surgeon:  Sherren Mocha, MD;  Location: [facility] CATH LAB;  Service: Cardiovascular;  Laterality: N/A;   POLYPECTOMY  03/19/2019   Procedure: POLYPECTOMY;  Surgeon: Otis Brace, MD;  Location: [facility] ENDOSCOPY;  Service: Gastroenterology;;   Clide Deutscher  01/22/2019   Procedure: Clide Deutscher;  Surgeon: Wilford Corner, MD;  Location: [facility] ENDOSCOPY;  Service: Endoscopy;;     OB History   No obstetric history on file.      Home Medications    Prior to Admission medications   Medication Sig Start Date End Date Taking? Authorizing Provider  acetaminophen (TYLENOL) 500 MG tablet Take 1,000 mg by mouth every 6 (six) hours as needed (pain).    [provider]  albuterol (VENTOLIN HFA) 108 (90 Base) MCG/ACT inhaler Inhale 2 puffs into the lungs every 4 (four) hours as needed for wheezing or shortness of breath.    [provider]  amiodarone (PACERONE) 200 MG tablet Take 1 tablet (200 mg total) by mouth 2 (two) times daily. 05/14/19 06/13/19  Guilford Shi, MD  apixaban (ELIQUIS) 2.5 MG TABS tablet Take 1 tablet (2.5 mg total) by mouth 2 (two) times daily. 03/19/19   Debbe Odea, MD  atorvastatin (LIPITOR) 40 MG tablet Take 1 tablet (40 mg total) by mouth daily at 6 PM. 07/05/18   Deboraha Sprang, MD  carvedilol (COREG) 3.125 MG tablet Take 1 tablet (3.125 mg total) by mouth 2 (two) times daily with a meal.  09/12/18   Deboraha Sprang, MD  Cholecalciferol (VITAMIN D-3) 25 MCG (1000 UT) CAPS Take 1,000 Units by mouth at bedtime.     [provider]  Ensure (ENSURE) Take 237 mLs by mouth daily.    [provider]  ferrous gluconate (FERGON) 324 MG tablet Take 324 mg by mouth 2 (two) times daily.  07/05/18   [provider]  furosemide (LASIX) 80 MG tablet Take 0.5 tablets (40 mg total) by mouth daily. 04/19/19   Nita Sells, MD  hydrALAZINE (APRESOLINE) 25 MG tablet Take 0.5 tablets (12.5 mg total) by mouth 3 (three) times daily. 01/12/19   Guilford Shi, MD  magnesium oxide (MAGNESIUM-OXIDE) 400 (241.3 Mg) MG tablet Take 1 tablet (400 mg total) by mouth daily. 05/14/19   Guilford Shi, MD  mexiletine (MEXITIL) 200 MG capsule Take 1 capsule (200 mg total) by mouth 2 (two) times daily. 01/24/19   [name], PA-C  nitroGLYCERIN (NITROSTAT) 0.4 MG SL tablet Place 1 tablet (0.4 mg total) under the tongue every 5 (five) minutes as needed for chest pain. 01/12/19   Guilford Shi, MD  ondansetron (ZOFRAN ODT) 4 MG disintegrating tablet Take 1 tablet (4 mg total) by mouth every 8 (eight) hours as needed for nausea or vomiting. Patient not taking: Reported on 05/05/2019 03/21/19   [place] Donnamarie Poag, DO  pantoprazole (PROTONIX) 40 MG tablet Take 1 tablet (40 mg total) by mouth daily. 04/05/19 07/04/19  [place] Donnamarie Poag, DO  polyethylene glycol (MIRALAX / GLYCOLAX) 17 g packet Take 17 g by mouth daily. Can take up to twice a day for constipation. Patient taking differently: Take 17 g by mouth daily as needed (constipation).  03/19/19   Debbe Odea, MD  senna-docusate (SENOKOT-S) 8.6-50 MG tablet Take 1 tablet by mouth at bedtime as needed for mild constipation. 05/10/19   Charolotte Capuchin, MD  sucralfate (CARAFATE) 1 GM/10ML suspension Take 10 mLs (1 g total) by mouth 4 (four) times daily -  with meals and at bedtime. 03/19/19   Debbe Odea, MD    Family History Family History  Problem Relation Age of Onset   Cancer Mother     Social History Social History   Tobacco Use   Smoking status: Former Smoker   Smokeless tobacco: Never Used  Substance Use Topics   Alcohol use: No   Drug use: No     Allergies   Strawberry extract   Review of Systems Review of Systems  Gastrointestinal: Positive for abdominal pain.  All other systems reviewed and are negative.    Physical Exam Updated Vital Signs BP (!) 128/47    Pulse 68    Temp (!) 97.5 F (36.4 C)    Resp 18    SpO2 96%   Physical Exam Vitals signs and nursing note  reviewed.  Constitutional:      General: She is not in acute distress.    Appearance: She is well-developed and normal weight.     Comments: Chronically ill-appearing  HENT:     Head: Normocephalic and atraumatic.  Eyes:     Pupils: Pupils are equal, round, and reactive to light.  Cardiovascular:     Rate and Rhythm: Normal rate and regular rhythm.     Heart sounds: Murmur present. No friction rub.  Pulmonary:     Effort: Pulmonary effort is normal.     Breath sounds: Normal breath sounds. No wheezing or rales.  Abdominal:     General: Bowel sounds are  normal. There is distension.     Palpations: Abdomen is soft.     Tenderness: There is abdominal tenderness in the right upper quadrant, right lower quadrant and periumbilical area. There is guarding. There is no rebound.     Hernia: No hernia is present.  Musculoskeletal: Normal range of motion.        General: No tenderness.     Comments: No edema  Skin:    General: Skin is warm and dry.     Coloration: Skin is pale.     Findings: No rash.  Neurological:     Mental Status: She is alert and oriented to person, place, and time.     Cranial Nerves: No cranial nerve deficit.  Psychiatric:        Behavior: Behavior normal.      ED Treatments / Results  Labs (all labs ordered are listed, but only abnormal results are displayed) Labs Reviewed  COMPREHENSIVE METABOLIC PANEL - Abnormal; Notable for the following components:      Result Value   Potassium 3.1 (*)    BUN 30 (*)    Creatinine, Ser 2.08 (*)    Calcium 8.6 (*)    Albumin 2.2 (*)    AST 85 (*)    GFR calc non Af Amer 23 (*)    GFR calc Af Amer 26 (*)    All other components within normal limits  CBC - Abnormal; Notable for the following components:   WBC 16.0 (*)    RBC 2.92 (*)    Hemoglobin 7.7 (*)    HCT 23.2 (*)    MCV 79.5 (*)    All other components within normal limits  BRAIN NATRIURETIC PEPTIDE - Abnormal; Notable for the following components:   B  Natriuretic Peptide 1,587.2 (*)    All other components within normal limits  POC OCCULT BLOOD, ED - Abnormal; Notable for the following components:   Fecal Occult Bld POSITIVE (*)    All other components within normal limits  SARS CORONAVIRUS 2 (TAT 6-24 HRS)  LIPASE, BLOOD  LACTIC ACID, PLASMA  POC OCCULT BLOOD, ED    EKG EKG Interpretation  Date/Time:  Sunday May 26 2019 15:48:30 EST Ventricular Rate:  60 PR Interval:  252 QRS Duration: 138 QT Interval:  532 QTC Calculation: 532 R Axis:   -38 Text Interpretation: Atrial-paced rhythm with prolonged AV conduction Left axis deviation Non-specific intra-ventricular conduction block Minimal voltage criteria for LVH, may be normal variant ( Cornell product ) Nonspecific T wave abnormality No significant change since last tracing Confirmed by Blanchie Dessert [phone]) on 05/26/2019 5:09:18 PM   Radiology Ct Abdomen Pelvis Wo Contrast  Result Date: 05/26/2019 CLINICAL DATA:  73 year old female with history of abdominal distension. Diffuse abdominal pain, most severe on the right side. EXAM: CT ABDOMEN AND PELVIS WITHOUT CONTRAST TECHNIQUE: Multidetector CT imaging of the abdomen and pelvis was performed following the standard protocol without IV contrast. COMPARISON:  CT the abdomen and pelvis 03/17/2019. FINDINGS: Lower chest: Mild scarring in the visualized lung bases. Cardiomegaly. Atherosclerotic calcifications in the left anterior descending, left circumflex and right coronary arteries. Pacemaker leads terminating in the right atrium and right ventricular apex. Hepatobiliary: Liver has a slightly shrunken appearance and nodular contour, suggesting underlying cirrhosis. No definite suspicious cystic or solid hepatic lesions are confidently identified on today's noncontrast CT examination. Unenhanced appearance of the gallbladder is normal. Pancreas: No definite pancreatic mass or peripancreatic fluid collections or inflammatory  changes are noted  on today's noncontrast CT examination. Spleen: Unremarkable. Adrenals/Urinary Tract: Multiple nonobstructive calculi are noted within the collecting systems of both kidneys, largest of which measures up to 4 mm in the lower pole collecting system of the right kidney. No additional calculi are confidently identified along the course of either ureter or within the lumen of the urinary bladder. No hydroureteronephrosis. Unenhanced appearance of the kidneys is otherwise normal. Left adrenal gland is normal in appearance. 4.5 x 2.1 cm low-attenuation (-27 HU) right adrenal nodule, similar to prior studies, compatible with an adrenal myelolipoma. Unenhanced appearance of the urinary bladder is normal. Stomach/Bowel: Unenhanced appearance of the stomach is normal. No pathologic dilatation of small bowel or colon. Multiple loops of small bowel appear to extend into a large right inguinal hernia. The appendix is not confidently identified and may be surgically absent. Regardless, there are no inflammatory changes noted adjacent to the cecum to suggest the presence of an acute appendicitis at this time. Numerous colonic diverticulae are noted, without surrounding inflammatory changes to suggest an acute diverticulitis at this time. Vascular/Lymphatic: Aortic atherosclerosis. No lymphadenopathy noted in the abdomen or pelvis confidently identified on today's noncontrast CT examination. Reproductive: Uterus is markedly heterogeneous in appearance with multiple coarse calcifications, compatible with a fibroid uterus. Ovaries are unremarkable in appearance. Other: Trace volume of ascites.  No pneumoperitoneum. Musculoskeletal: There are no aggressive appearing lytic or blastic lesions noted in the visualized portions of the skeleton. IMPRESSION: 1. Large right inguinal hernia containing several loops of small bowel, without evidence of bowel incarceration or obstruction. 2. Numerous small nonobstructive calculi  are noted in the collecting systems of both kidneys measuring up to 4 mm in the lower pole. No ureteral stones or findings of urinary tract obstruction are noted at this time. 3. Colonic diverticulosis without evidence of acute diverticulitis at this time. 4. Severe cardiomegaly. 5. Aortic atherosclerosis, in addition to least 3 vessel coronary artery disease. Assessment for potential risk factor modification, dietary therapy or pharmacologic therapy may be warranted, if clinically indicated. 6. Morphologic changes in the liver suggestive of underlying cirrhosis. 7. Additional incidental findings, as above. Electronically Signed   By: Vinnie Langton M.D.   On: 05/26/2019 18:27   Dg Chest 2 View  Result Date: 05/26/2019 CLINICAL DATA:  Patient with right-sided abdominal pain. History of CHF. EXAM: CHEST - 2 VIEW COMPARISON:  Chest radiograph 02/20/2019 FINDINGS: Multi lead AICD device overlies the left hemithorax. Leads are stable in position. Stable cardiomegaly. Tortuosity of the thoracic aorta. Heterogeneous opacities left lung base. Pulmonary vascular redistribution. Trace bilateral pleural effusions. Thoracic spine degenerative changes. IMPRESSION: Cardiomegaly with pulmonary vascular redistribution and mild interstitial edema. Heterogeneous opacities left lung base may represent atelectasis. Probable trace bilateral pleural effusions. Electronically Signed   By: Lovey Newcomer M.D.   On: 05/26/2019 20:01    Procedures Procedures (including critical care time)  Medications Ordered in ED Medications  fentaNYL (SUBLIMAZE) injection 25 mcg (has no administration in time range)  metoCLOPramide (REGLAN) injection 10 mg (has no administration in time range)  sodium chloride flush (NS) 0.9 % injection 3 mL (3 mLs Intravenous Given 05/26/19 1706)     Initial Impression / Assessment and Plan / ED Course  I have reviewed the triage vital signs and the nursing notes.  Pertinent labs & imaging results  that were available during my care of the patient were reviewed by me and considered in my medical decision making (see chart for details).  Elderly female with more diffuse use abdominal pain but worse on the right that is been present now for 5 days.  Patient has numerous medical problems and recent admission for CHF exacerbation.  Patient is in no respiratory distress at this time and is breathing comfortably with normal oxygen saturation.  There is still some edema in her lower extremities.  He does have abdominal distention and pain in the right upper and right lower quadrant as well as periumbilically.  She did have a HIDA scan done this year that showed a normal gallbladder function.  Labs are significant for normal lipase, CMP with persistent kidney disease with a creatinine of 2.08 and mild hypokalemia of 3.1.  CBC with leukocytosis of 16,000 and new anemia with a hemoglobin of 7.7 from her baseline of 9.  Will check Hemoccult but concern for acute abdominal pathology versus a worsening CHF versus GI bleed and anemia.  9:06 PM CT shows multiple chronic findings including right inguinal hernia but no evidence of bowel obstruction or ischemia.  Patient has multiple kidney stones but does not appear to have a current kidney stone that would be causing issues.  Patient's lactic acid is within normal limits.  BNP continues to rise which might be the source of her shortness of breath as well as checks x-ray showing signs of fluid overload and pleural effusions.  Still no clear cause for her leukocytosis.  Urine is pending.  After 1 dose of pain no nausea medicine patient's pain has been improving still unclear the cause for her pain.  12:46 AM After speaking with her daughter the patient has had worsening swelling in her legs, dizziness upon standing and worsening shortness of breath.  Based on lab findings she has worsening CHF.  She was given IV lasix, BNP is elevated at 1587 which is higher  than 3 days ago.  She is more anemic today at 7.7 and Hemoccult is positive which seems to be more chronic in nature.  Patient after 1 dose of pain medication was requesting food and was able to eat and not vomit.   Final Clinical Impressions(s) / ED Diagnoses   Final diagnoses:  Acute on chronic congestive heart failure, unspecified heart failure type ([place])  Generalized abdominal pain  Anemia, unspecified type    ED Discharge Orders    None       Blanchie Dessert, MD 05/27/19 [phone]

## 2019-05-26 NOTE — ED Triage Notes (Signed)
Pt bib ems from home with R sided abd pain. Hx CHF with edema to BLE and abdomen. Seen earlier this week and pt left AMA. Tender when palpated. Endorses nausea, denies emesis.  20g Rhand given 4mg  zofran 136/60 HR 68 regular 97% RA 98.4F

## 2019-05-26 NOTE — ED Notes (Signed)
Pt. Stats dropped to mid 80's while sleeping. Upon checking on Pt. She was oriented x 2 and followed commands. Repositioned and checked pleth to no avail. Put on 2L Nasal canula and monitored. She maintained 94-96 on 2L.Stephanie Yates

## 2019-05-26 NOTE — ED Notes (Signed)
Patient transported to CT 

## 2019-05-27 DIAGNOSIS — N184 Chronic kidney disease, stage 4 (severe): Secondary | ICD-10-CM | POA: Diagnosis present

## 2019-05-27 DIAGNOSIS — I5023 Acute on chronic systolic (congestive) heart failure: Secondary | ICD-10-CM | POA: Diagnosis present

## 2019-05-27 DIAGNOSIS — J9811 Atelectasis: Secondary | ICD-10-CM | POA: Diagnosis present

## 2019-05-27 DIAGNOSIS — Z515 Encounter for palliative care: Secondary | ICD-10-CM | POA: Diagnosis not present

## 2019-05-27 DIAGNOSIS — I472 Ventricular tachycardia: Secondary | ICD-10-CM | POA: Diagnosis present

## 2019-05-27 DIAGNOSIS — K7290 Hepatic failure, unspecified without coma: Secondary | ICD-10-CM | POA: Diagnosis not present

## 2019-05-27 DIAGNOSIS — Z20828 Contact with and (suspected) exposure to other viral communicable diseases: Secondary | ICD-10-CM | POA: Diagnosis present

## 2019-05-27 DIAGNOSIS — G8324 Monoplegia of upper limb affecting left nondominant side: Secondary | ICD-10-CM | POA: Diagnosis not present

## 2019-05-27 DIAGNOSIS — E440 Moderate protein-calorie malnutrition: Secondary | ICD-10-CM | POA: Diagnosis present

## 2019-05-27 DIAGNOSIS — G8194 Hemiplegia, unspecified affecting left nondominant side: Secondary | ICD-10-CM | POA: Diagnosis not present

## 2019-05-27 DIAGNOSIS — R270 Ataxia, unspecified: Secondary | ICD-10-CM | POA: Diagnosis not present

## 2019-05-27 DIAGNOSIS — K5900 Constipation, unspecified: Secondary | ICD-10-CM | POA: Diagnosis not present

## 2019-05-27 DIAGNOSIS — R4182 Altered mental status, unspecified: Secondary | ICD-10-CM | POA: Diagnosis not present

## 2019-05-27 DIAGNOSIS — R1013 Epigastric pain: Secondary | ICD-10-CM

## 2019-05-27 DIAGNOSIS — R471 Dysarthria and anarthria: Secondary | ICD-10-CM | POA: Diagnosis not present

## 2019-05-27 DIAGNOSIS — Z7189 Other specified counseling: Secondary | ICD-10-CM | POA: Diagnosis not present

## 2019-05-27 DIAGNOSIS — G9341 Metabolic encephalopathy: Secondary | ICD-10-CM | POA: Diagnosis not present

## 2019-05-27 DIAGNOSIS — D62 Acute posthemorrhagic anemia: Secondary | ICD-10-CM | POA: Diagnosis present

## 2019-05-27 DIAGNOSIS — R001 Bradycardia, unspecified: Secondary | ICD-10-CM | POA: Diagnosis present

## 2019-05-27 DIAGNOSIS — I428 Other cardiomyopathies: Secondary | ICD-10-CM | POA: Diagnosis present

## 2019-05-27 DIAGNOSIS — I5043 Acute on chronic combined systolic (congestive) and diastolic (congestive) heart failure: Secondary | ICD-10-CM | POA: Diagnosis present

## 2019-05-27 DIAGNOSIS — I480 Paroxysmal atrial fibrillation: Secondary | ICD-10-CM | POA: Diagnosis present

## 2019-05-27 DIAGNOSIS — K922 Gastrointestinal hemorrhage, unspecified: Secondary | ICD-10-CM | POA: Diagnosis present

## 2019-05-27 DIAGNOSIS — I130 Hypertensive heart and chronic kidney disease with heart failure and stage 1 through stage 4 chronic kidney disease, or unspecified chronic kidney disease: Secondary | ICD-10-CM | POA: Diagnosis present

## 2019-05-27 DIAGNOSIS — I2510 Atherosclerotic heart disease of native coronary artery without angina pectoris: Secondary | ICD-10-CM | POA: Diagnosis present

## 2019-05-27 DIAGNOSIS — D649 Anemia, unspecified: Secondary | ICD-10-CM | POA: Diagnosis not present

## 2019-05-27 DIAGNOSIS — R29709 NIHSS score 9: Secondary | ICD-10-CM | POA: Diagnosis not present

## 2019-05-27 DIAGNOSIS — E785 Hyperlipidemia, unspecified: Secondary | ICD-10-CM | POA: Diagnosis present

## 2019-05-27 DIAGNOSIS — Z95810 Presence of automatic (implantable) cardiac defibrillator: Secondary | ICD-10-CM

## 2019-05-27 DIAGNOSIS — Z6826 Body mass index (BMI) 26.0-26.9, adult: Secondary | ICD-10-CM | POA: Diagnosis not present

## 2019-05-27 DIAGNOSIS — R54 Age-related physical debility: Secondary | ICD-10-CM | POA: Diagnosis present

## 2019-05-27 LAB — CBC
HCT: 24.3 % — ABNORMAL LOW (ref 36.0–46.0)
Hemoglobin: 8 g/dL — ABNORMAL LOW (ref 12.0–15.0)
MCH: 25.9 pg — ABNORMAL LOW (ref 26.0–34.0)
MCHC: 32.9 g/dL (ref 30.0–36.0)
MCV: 78.6 fL — ABNORMAL LOW (ref 80.0–100.0)
Platelets: 342 10*3/uL (ref 150–400)
RBC: 3.09 MIL/uL — ABNORMAL LOW (ref 3.87–5.11)
RDW: 15.1 % (ref 11.5–15.5)
WBC: 16.3 10*3/uL — ABNORMAL HIGH (ref 4.0–10.5)
nRBC: 0 % (ref 0.0–0.2)

## 2019-05-27 LAB — SARS CORONAVIRUS 2 (TAT 6-24 HRS): SARS Coronavirus 2: NEGATIVE

## 2019-05-27 LAB — TRANSFERRIN: Transferrin: 196 mg/dL (ref 192–382)

## 2019-05-27 LAB — IRON: Iron: 19 ug/dL — ABNORMAL LOW (ref 28–170)

## 2019-05-27 MED ORDER — POTASSIUM CHLORIDE CRYS ER 20 MEQ PO TBCR
40.0000 meq | EXTENDED_RELEASE_TABLET | ORAL | Status: AC
  Administered 2019-05-27 (×2): 40 meq via ORAL
  Filled 2019-05-27 (×2): qty 2

## 2019-05-27 MED ORDER — PANTOPRAZOLE SODIUM 40 MG PO TBEC
40.0000 mg | DELAYED_RELEASE_TABLET | Freq: Every day | ORAL | Status: DC
  Administered 2019-05-27 – 2019-06-07 (×12): 40 mg via ORAL
  Filled 2019-05-27 (×12): qty 1

## 2019-05-27 MED ORDER — SODIUM CHLORIDE 0.9% FLUSH: 3.0000 mL | INTRAVENOUS | Status: DC | PRN

## 2019-05-27 MED ORDER — POLYETHYLENE GLYCOL 3350 17 G PO PACK: 17.0000 g | PACK | Freq: Every day | ORAL | Status: DC | PRN

## 2019-05-27 MED ORDER — AMIODARONE HCL 200 MG PO TABS
200.0000 mg | ORAL_TABLET | Freq: Two times a day (BID) | ORAL | Status: DC
  Administered 2019-05-27 – 2019-06-07 (×23): 200 mg via ORAL
  Filled 2019-05-27 (×25): qty 1

## 2019-05-27 MED ORDER — SODIUM CHLORIDE 0.9% FLUSH
3.0000 mL | Freq: Two times a day (BID) | INTRAVENOUS | Status: DC
  Administered 2019-05-27 – 2019-05-31 (×7): 3 mL via INTRAVENOUS

## 2019-05-27 MED ORDER — FUROSEMIDE 10 MG/ML IJ SOLN
40.0000 mg | Freq: Every day | INTRAMUSCULAR | Status: DC
  Administered 2019-05-27 – 2019-05-28 (×2): 40 mg via INTRAVENOUS
  Filled 2019-05-27 (×2): qty 4

## 2019-05-27 MED ORDER — MEXILETINE HCL 200 MG PO CAPS
200.0000 mg | ORAL_CAPSULE | Freq: Two times a day (BID) | ORAL | Status: DC
  Administered 2019-05-27 – 2019-06-07 (×23): 200 mg via ORAL
  Filled 2019-05-27 (×24): qty 1

## 2019-05-27 MED ORDER — POTASSIUM CHLORIDE CRYS ER 20 MEQ PO TBCR
40.0000 meq | EXTENDED_RELEASE_TABLET | Freq: Once | ORAL | Status: AC
  Administered 2019-05-27: 40 meq via ORAL
  Filled 2019-05-27: qty 2

## 2019-05-27 MED ORDER — HYDRALAZINE HCL 25 MG PO TABS
12.5000 mg | ORAL_TABLET | Freq: Three times a day (TID) | ORAL | Status: DC
  Administered 2019-05-27 – 2019-06-02 (×21): 12.5 mg via ORAL
  Filled 2019-05-27 (×23): qty 1

## 2019-05-27 MED ORDER — FERROUS GLUCONATE 324 (38 FE) MG PO TABS
324.0000 mg | ORAL_TABLET | Freq: Two times a day (BID) | ORAL | Status: DC
  Administered 2019-05-27 – 2019-06-07 (×23): 324 mg via ORAL
  Filled 2019-05-27 (×24): qty 1

## 2019-05-27 MED ORDER — ALBUTEROL SULFATE (2.5 MG/3ML) 0.083% IN NEBU
2.5000 mg | INHALATION_SOLUTION | RESPIRATORY_TRACT | Status: DC | PRN
  Administered 2019-05-27 – 2019-06-07 (×3): 2.5 mg via RESPIRATORY_TRACT
  Filled 2019-05-27 (×3): qty 3

## 2019-05-27 MED ORDER — ACETAMINOPHEN 325 MG PO TABS
650.0000 mg | ORAL_TABLET | ORAL | Status: DC | PRN
  Administered 2019-05-30: 23:00:00 650 mg via ORAL
  Filled 2019-05-27 (×2): qty 2

## 2019-05-27 MED ORDER — SUCRALFATE 1 GM/10ML PO SUSP
1.0000 g | Freq: Three times a day (TID) | ORAL | Status: DC
  Administered 2019-05-27 – 2019-06-07 (×43): 1 g via ORAL
  Filled 2019-05-27 (×43): qty 10

## 2019-05-27 MED ORDER — CARVEDILOL 3.125 MG PO TABS
3.1250 mg | ORAL_TABLET | Freq: Two times a day (BID) | ORAL | Status: DC
  Administered 2019-05-27 – 2019-06-07 (×21): 3.125 mg via ORAL
  Filled 2019-05-27 (×24): qty 1

## 2019-05-27 MED ORDER — SODIUM CHLORIDE 0.9 % IV SOLN: 250.0000 mL | INTRAVENOUS | Status: DC | PRN

## 2019-05-27 MED ORDER — MAGNESIUM OXIDE 400 (241.3 MG) MG PO TABS
400.0000 mg | ORAL_TABLET | Freq: Every day | ORAL | Status: DC
  Administered 2019-05-27 – 2019-06-07 (×12): 400 mg via ORAL
  Filled 2019-05-27 (×12): qty 1

## 2019-05-27 MED ORDER — ATORVASTATIN CALCIUM 40 MG PO TABS
40.0000 mg | ORAL_TABLET | Freq: Every day | ORAL | Status: DC
  Administered 2019-05-27 – 2019-06-06 (×11): 40 mg via ORAL
  Filled 2019-05-27 (×11): qty 1

## 2019-05-27 NOTE — H&P (Addendum)
History and Physical    Stephanie Yates PNT:[id number] DOB: 05-Aug-1945 DOA: 05/26/2019  PCP: Josetta Huddle, MD  Patient coming from: Home  I have personally briefly reviewed patient's old medical records in [place]  Chief Complaint: Abd pain  HPI: Stephanie Yates is a 73 y.o. female with medical history significant of HFrEF with EF 10-15%, NICM, V.Tach s/p AICD dual chamber PPM and on amiodarone.  Patient presents to ED with c/o abd pain.  Onset about 1 month ago she tells me, coming and going but worse over the past 5 days.  Severe nausea and pain worse when she tries to eat.  She has been straining to have bowel movements and her stool has seemed hard but she denies persistent black stool or any hematemesis.  Came to ED a few days ago but went home due to long wait.  She was recently hospitalized for CHF exacerbation and states she continues to take her diuretics but her leg still seems swollen.  She does not check her weight regularly.  She denies fever, cough but does have some shortness of breath.  She denies any chest pain.  Does have a chronic, large R inguinal hernia that has been painful for "about the past month".   ED Course: CT abd /pelvis shows large R inguinal hernia but no evidence of obstruction or incarceration.  CXR shows mild pulm edema with pulm vasc congestion.  BNP 1587.  Creat 2.0.  HGB 7.7 (8.4 on 11/26 and 9.3 earlier in the month).  Hemoccult is positive.  WBC 16k (18.5 on 11/26).  Given 40mg  IV lasix in ED.   Review of Systems: As per HPI, otherwise all review of systems negative.  Past Medical History:  Diagnosis Date   Chronic anemia    Chronic systolic CHF (congestive heart failure) (HCC)    CKD (chronic kidney disease), stage IV (HCC)    Former tobacco use    History of tobacco abuse    Hyperlipidemia    Hypertension    Mitral regurgitation    Nonischemic cardiomyopathy ([place])    a. EF 10-15% on 11/2012 cath with no  significant CAD. b. EF 25% in 2019. b. EF 15% in 12/2018   PAF (paroxysmal atrial fibrillation) (HCC)    Protein calorie malnutrition ([place])    Quit consuming alcohol in remote past    Sinus bradycardia 12/21/2012   Ventricular tachycardia ([place]) 12/14/2012    Past Surgical History:  Procedure Laterality Date   BIOPSY  01/22/2019   Procedure: BIOPSY;  Surgeon: Wilford Corner, MD;  Location: [place];  Service: Endoscopy;;   COLONOSCOPY WITH PROPOFOL N/A 03/19/2019   Procedure: COLONOSCOPY WITH PROPOFOL;  Surgeon: Otis Brace, MD;  Location: [place];  Service: Gastroenterology;  Laterality: N/A;   ESOPHAGOGASTRODUODENOSCOPY (EGD) WITH PROPOFOL N/A 06/18/2017   Procedure: ESOPHAGOGASTRODUODENOSCOPY (EGD) WITH PROPOFOL;  Surgeon: Ronnette Juniper, MD;  Location: [place];  Service: Gastroenterology;  Laterality: N/A;   ESOPHAGOGASTRODUODENOSCOPY (EGD) WITH PROPOFOL N/A 01/22/2019   Procedure: ESOPHAGOGASTRODUODENOSCOPY (EGD) WITH PROPOFOL;  Surgeon: Wilford Corner, MD;  Location: [place];  Service: Endoscopy;  Laterality: N/A;   ESOPHAGOGASTRODUODENOSCOPY (EGD) WITH PROPOFOL N/A 03/19/2019   Procedure: ESOPHAGOGASTRODUODENOSCOPY (EGD) WITH PROPOFOL;  Surgeon: Otis Brace, MD;  Location: [place];  Service: Gastroenterology;  Laterality: N/A;   IMPLANTABLE CARDIOVERTER DEFIBRILLATOR IMPLANT  12/19/2012   St. Jude dual-chamber ICD, serial number [id number]   IMPLANTABLE CARDIOVERTER DEFIBRILLATOR IMPLANT N/A 12/19/2012   Procedure: IMPLANTABLE CARDIOVERTER DEFIBRILLATOR IMPLANT;  Surgeon: Remo Lipps  Peterson Lombard, MD;  [facility];  Service: Cardiovascular;  Laterality: N/A;   LEFT AND RIGHT HEART CATHETERIZATION WITH CORONARY ANGIOGRAM N/A 12/17/2012   Procedure: LEFT AND RIGHT HEART CATHETERIZATION WITH CORONARY ANGIOGRAM;  Surgeon: Sherren Mocha, MD;  Location[facility] CATH LAB;  Service: Cardiovascular;  Laterality: N/A;   POLYPECTOMY  03/19/2019   Procedure:  POLYPECTOMY;  Surgeon: Otis Brace, MD;  Location: [place] ENDOSCOPY;  Service: Gastroenterology;;   SCLEROTHERAPY  01/22/2019   Procedure: Clide Deutscher;  Surgeon: Wilford Corner, MD;  Location: [place];  Service: Endoscopy;;     reports that she has quit smoking. She has never used smokeless tobacco. She reports that she does not drink alcohol or use drugs.  Allergies  Allergen Reactions   Strawberry Extract Hives, Itching and Swelling         Family History  Problem Relation Age of Onset   Cancer Mother      Prior to Admission medications   Medication Sig Start Date End Date Taking? Authorizing Provider  acetaminophen (TYLENOL) 500 MG tablet Take 1,000 mg by mouth every 6 (six) hours as needed (pain).    [provider]  albuterol (VENTOLIN HFA) 108 (90 Base) MCG/ACT inhaler Inhale 2 puffs into the lungs every 4 (four) hours as needed for wheezing or shortness of breath.    [provider]  amiodarone (PACERONE) 200 MG tablet Take 1 tablet (200 mg total) by mouth 2 (two) times daily. 05/14/19 06/13/19  Guilford Shi, MD  apixaban (ELIQUIS) 2.5 MG TABS tablet Take 1 tablet (2.5 mg total) by mouth 2 (two) times daily. 03/19/19   Debbe Odea, MD  atorvastatin (LIPITOR) 40 MG tablet Take 1 tablet (40 mg total) by mouth daily at 6 PM. 07/05/18   Deboraha Sprang, MD  carvedilol (COREG) 3.125 MG tablet Take 1 tablet (3.125 mg total) by mouth 2 (two) times daily with a meal. 09/12/18   Deboraha Sprang, MD  Cholecalciferol (VITAMIN D-3) 25 MCG (1000 UT) CAPS Take 1,000 Units by mouth at bedtime.     [provider]  Ensure (ENSURE) Take 237 mLs by mouth daily.    [provider]  ferrous gluconate (FERGON) 324 MG tablet Take 324 mg by mouth 2 (two) times daily.  07/05/18   [provider]  furosemide (LASIX) 80 MG tablet Take 0.5 tablets (40 mg total) by mouth daily. 04/19/19   Nita Sells, MD  hydrALAZINE (APRESOLINE) 25 MG  tablet Take 0.5 tablets (12.5 mg total) by mouth 3 (three) times daily. 01/12/19   Guilford Shi, MD  magnesium oxide (MAGNESIUM-OXIDE) 400 (241.3 Mg) MG tablet Take 1 tablet (400 mg total) by mouth daily. 05/14/19   Guilford Shi, MD  mexiletine (MEXITIL) 200 MG capsule Take 1 capsule (200 mg total) by mouth 2 (two) times daily. 01/24/19   [name], PA-C  nitroGLYCERIN (NITROSTAT) 0.4 MG SL tablet Place 1 tablet (0.4 mg total) under the tongue every 5 (five) minutes as needed for chest pain. 01/12/19   Guilford Shi, MD  ondansetron (ZOFRAN ODT) 4 MG disintegrating tablet Take 1 tablet (4 mg total) by mouth every 8 (eight) hours as needed for nausea or vomiting. Patient not taking: Reported on 05/05/2019 03/21/19   [place] Donnamarie Poag, DO  pantoprazole (PROTONIX) 40 MG tablet Take 1 tablet (40 mg total) by mouth daily. 04/05/19 07/04/19  [place] Donnamarie Poag, DO  polyethylene glycol (MIRALAX / GLYCOLAX) 17 g packet Take 17 g by mouth daily. Can take  up to twice a day for constipation. Patient taking differently: Take 17 g by mouth daily as needed (constipation).  03/19/19   Debbe Odea, MD  senna-docusate (SENOKOT-S) 8.6-50 MG tablet Take 1 tablet by mouth at bedtime as needed for mild constipation. 05/10/19   Charolotte Capuchin, MD  sucralfate (CARAFATE) 1 GM/10ML suspension Take 10 mLs (1 g total) by mouth 4 (four) times daily -  with meals and at bedtime. 03/19/19   Debbe Odea, MD    Physical Exam: Vitals:   05/26/19 2345 05/26/19 2351 05/27/19 0000 05/27/19 0030  BP: (!) 135/54  (!) 133/56 (!) 133/54  Pulse: 62  60 63  Resp: 14  15 17   Temp:      SpO2: 97% 98% 97% 96%  Weight:      Height:        Constitutional: NAD, calm, comfortable Eyes: PERRL, lids and conjunctivae normal ENMT: Mucous membranes are moist. Posterior pharynx clear of any exudate or lesions.Normal dentition.  Neck: normal, supple, no masses, no thyromegaly Respiratory: clear to auscultation bilaterally, no  wheezing, no crackles. Normal respiratory effort. No accessory muscle use.  Cardiovascular: Regular rate and rhythm, no murmurs / rubs / gallops. No extremity edema. 2+ pedal pulses. No carotid bruits.  Abdomen: no tenderness, no masses palpated. No hepatosplenomegaly. Bowel sounds positive.  Musculoskeletal: no clubbing / cyanosis. No joint deformity upper and lower extremities. Good ROM, no contractures. Normal muscle tone.  Skin: no rashes, lesions, ulcers. No induration Neurologic: CN 2-12 grossly intact. Sensation intact, DTR normal. Strength 5/5 in all 4.  Psychiatric: Normal judgment and insight. Alert and oriented x 3. Normal mood.    Labs on Admission: I have personally reviewed following labs and imaging studies  CBC: Recent Labs  Lab 05/23/19 0056 05/26/19 1549  WBC 18.5* 16.0*  HGB 8.4* 7.7*  HCT 26.0* 23.2*  MCV 80.7 79.5*  PLT 415* 836   Basic Metabolic Panel: Recent Labs  Lab 05/23/19 0056 05/26/19 1549  NA 142 141  K 3.5 3.1*  CL 105 105  CO2 25 26  GLUCOSE 98 96  BUN 28* 30*  CREATININE 2.16* 2.08*  CALCIUM 8.9 8.6*   GFR: Estimated Creatinine Clearance: 19 mL/min (A) (by C-G formula based on SCr of 2.08 mg/dL (H)). Liver Function Tests: Recent Labs  Lab 05/26/19 1549  AST 85*  ALT 39  ALKPHOS 98  BILITOT 1.2  PROT 7.0  ALBUMIN 2.2*   Recent Labs  Lab 05/26/19 1549  LIPASE 28   No results for input(s): AMMONIA in the last 168 hours. Coagulation Profile: Recent Labs  Lab 05/23/19 0056  INR 2.2*   Cardiac Enzymes: No results for input(s): CKTOTAL, CKMB, CKMBINDEX, TROPONINI in the last 168 hours. BNP (last 3 results) No results for input(s): PROBNP in the last 8760 hours. HbA1C: No results for input(s): HGBA1C in the last 72 hours. CBG: No results for input(s): GLUCAP in the last 168 hours. Lipid Profile: No results for input(s): CHOL, HDL, LDLCALC, TRIG, CHOLHDL, LDLDIRECT in the last 72 hours. Thyroid Function Tests: No results  for input(s): TSH, T4TOTAL, FREET4, T3FREE, THYROIDAB in the last 72 hours. Anemia Panel: No results for input(s): VITAMINB12, FOLATE, FERRITIN, TIBC, IRON, RETICCTPCT in the last 72 hours. Urine analysis:    Component Value Date/Time   COLORURINE YELLOW 05/23/2019 0848   APPEARANCEUR HAZY (A) 05/23/2019 0848   LABSPEC 1.013 05/23/2019 0848   PHURINE 6.0 05/23/2019 0848   GLUCOSEU NEGATIVE 05/23/2019 0848   HGBUR LARGE (  A) 05/23/2019 0848   BILIRUBINUR NEGATIVE 05/23/2019 0848   KETONESUR NEGATIVE 05/23/2019 0848   PROTEINUR 30 (A) 05/23/2019 0848   UROBILINOGEN 1.0 08/29/2014 2035   NITRITE NEGATIVE 05/23/2019 0848   LEUKOCYTESUR NEGATIVE 05/23/2019 0848    Radiological Exams on Admission: Ct Abdomen Pelvis Wo Contrast  Result Date: 05/26/2019 CLINICAL DATA:  73 year old female with history of abdominal distension. Diffuse abdominal pain, most severe on the right side. EXAM: CT ABDOMEN AND PELVIS WITHOUT CONTRAST TECHNIQUE: Multidetector CT imaging of the abdomen and pelvis was performed following the standard protocol without IV contrast. COMPARISON:  CT the abdomen and pelvis 03/17/2019. FINDINGS: Lower chest: Mild scarring in the visualized lung bases. Cardiomegaly. Atherosclerotic calcifications in the left anterior descending, left circumflex and right coronary arteries. Pacemaker leads terminating in the right atrium and right ventricular apex. Hepatobiliary: Liver has a slightly shrunken appearance and nodular contour, suggesting underlying cirrhosis. No definite suspicious cystic or solid hepatic lesions are confidently identified on today's noncontrast CT examination. Unenhanced appearance of the gallbladder is normal. Pancreas: No definite pancreatic mass or peripancreatic fluid collections or inflammatory changes are noted on today's noncontrast CT examination. Spleen: Unremarkable. Adrenals/Urinary Tract: Multiple nonobstructive calculi are noted within the collecting systems of  both kidneys, largest of which measures up to 4 mm in the lower pole collecting system of the right kidney. No additional calculi are confidently identified along the course of either ureter or within the lumen of the urinary bladder. No hydroureteronephrosis. Unenhanced appearance of the kidneys is otherwise normal. Left adrenal gland is normal in appearance. 4.5 x 2.1 cm low-attenuation (-27 HU) right adrenal nodule, similar to prior studies, compatible with an adrenal myelolipoma. Unenhanced appearance of the urinary bladder is normal. Stomach/Bowel: Unenhanced appearance of the stomach is normal. No pathologic dilatation of small bowel or colon. Multiple loops of small bowel appear to extend into a large right inguinal hernia. The appendix is not confidently identified and may be surgically absent. Regardless, there are no inflammatory changes noted adjacent to the cecum to suggest the presence of an acute appendicitis at this time. Numerous colonic diverticulae are noted, without surrounding inflammatory changes to suggest an acute diverticulitis at this time. Vascular/Lymphatic: Aortic atherosclerosis. No lymphadenopathy noted in the abdomen or pelvis confidently identified on today's noncontrast CT examination. Reproductive: Uterus is markedly heterogeneous in appearance with multiple coarse calcifications, compatible with a fibroid uterus. Ovaries are unremarkable in appearance. Other: Trace volume of ascites.  No pneumoperitoneum. Musculoskeletal: There are no aggressive appearing lytic or blastic lesions noted in the visualized portions of the skeleton. IMPRESSION: 1. Large right inguinal hernia containing several loops of small bowel, without evidence of bowel incarceration or obstruction. 2. Numerous small nonobstructive calculi are noted in the collecting systems of both kidneys measuring up to 4 mm in the lower pole. No ureteral stones or findings of urinary tract obstruction are noted at this time. 3.  Colonic diverticulosis without evidence of acute diverticulitis at this time. 4. Severe cardiomegaly. 5. Aortic atherosclerosis, in addition to least 3 vessel coronary artery disease. Assessment for potential risk factor modification, dietary therapy or pharmacologic therapy may be warranted, if clinically indicated. 6. Morphologic changes in the liver suggestive of underlying cirrhosis. 7. Additional incidental findings, as above. Electronically Signed   By: Vinnie Langton M.D.   On: 05/26/2019 18:27   Dg Chest 2 View  Result Date: 05/26/2019 CLINICAL DATA:  Patient with right-sided abdominal pain. History of CHF. EXAM: CHEST - 2 VIEW COMPARISON:  Chest radiograph 02/20/2019 FINDINGS: Multi lead AICD device overlies the left hemithorax. Leads are stable in position. Stable cardiomegaly. Tortuosity of the thoracic aorta. Heterogeneous opacities left lung base. Pulmonary vascular redistribution. Trace bilateral pleural effusions. Thoracic spine degenerative changes. IMPRESSION: Cardiomegaly with pulmonary vascular redistribution and mild interstitial edema. Heterogeneous opacities left lung base may represent atelectasis. Probable trace bilateral pleural effusions. Electronically Signed   By: Lovey Newcomer M.D.   On: 05/26/2019 20:01    EKG: Independently reviewed.  Assessment/Plan Principal Problem:   Acute on chronic systolic CHF (congestive heart failure) (HCC) Active Problems:   Nonischemic cardiomyopathy (HCC)   PAF (paroxysmal atrial fibrillation) (HCC)   ICD (implantable cardioverter-defibrillator), dual, [employer]   Symptomatic anemia   Acute GI bleeding   CKD (chronic kidney disease) stage 4, GFR 15-29 ml/min (HCC)   Epigastric abdominal pain   Prolonged QT interval    1. Acute GIB with worsening acute blood loss anemia - 1. No frank melena, hematochezia, or hemoptysis. 2. But anemia is progressively worsening over past couple weeks 3. Will hold eliquis for the moment 4. Call GI in  AM 5. EGD in Sept showed esophagitis 1. Continue PPI 2. Continue carafate 2. RUQ and RLQ abd pain - 1. Esophagitis induced? 2. Patient seems to think hernia may be causing, though no evidence of obstruction or incarceration on CT scan 3. GI to be consulted for the GIB in AM anyhow 3. Acute on chronic systolic CHF - 1. CHF pathway 2. Convert lasix to IV dosing (40mg  IV daily) 3. Strict intake and output 4. Daily BMP 5. Replacing K now 6. 2d echo done in July. 4. PAF - 1. Holding eliquis 2. Cont amiodarone 5. HTN - 1. Continue coreg, hydralazine  DVT prophylaxis: SCDs Code Status: Partial - No intubation Family Communication: No family in room Disposition Plan: Home after admit Consults called: None Admission status: Place in 31    Lizzete Gough, [place] [facility]  How to contact the [facility] Attending or Consulting provider [place] or covering provider during after hours [place], for this patient?  1. Check the care team in [facility] and look for a) attending/consulting [facility] provider listed and b) the [facility] team listed 2. Log into [email or web address]  Amion Physician Scheduling and messaging for groups and whole hospitals  On call and physician scheduling software for group practices, residents, hospitalists and other medical providers for call, clinic, rotation and shift schedules. On[employer] is a hospital-wide system for scheduling doctors and paging doctors on call. EasyPlot is for scientific plotting and data analysis.  [email or web address]  and use [place]'s universal password to access. If you do not have the password, please contact the hospital operator.  3. Locate the [facility] provider you are looking for under [facility] and page to a number that you can be directly reached. 4. If you still have difficulty reaching the provider, please page the [facility] (Director on Call) for the Hospitalists listed on amion for assistance.  05/27/2019, 12:45 AM

## 2019-05-27 NOTE — ED Notes (Signed)
Pt gone up stairs to room

## 2019-05-27 NOTE — Care Management Obs Status (Signed)
[place] NOTIFICATION   Patient Details  Name: CHEILA WICKSTROM MRN: [id number] Date of Birth: [birth year]   Medicare Observation Status Notification Given:  Yes    Bethena Roys, RN 05/27/2019, 11:39 AM

## 2019-05-27 NOTE — Consult Note (Signed)
[facility] Consultation Note  Referring Provider: [facility] Primary Care Physician:  Josetta Huddle, MD  Reason for Consultation:  Anemia, hemoccult positive stools  HPI: Stephanie Yates is a 73 y.o. female admitted for heart failure exacerbation.  Found to have drop hgb ~ 3 grams over the past few weeks; no overt GI bleeding (no hematemesis, melena, hematochezia) but is hemoccult-positive.  She has ongoing upper abdominal pain.  Had multiple endoscopies to evaluate for anemia and melena, most recently couple months ago; had colonoscopy couple months ago to assess for anemia.   Past Medical History:  Diagnosis Date  . Chronic anemia   . Chronic systolic CHF (congestive heart failure) ([place])   . CKD (chronic kidney disease), stage IV ([place])   . Former tobacco use   . History of tobacco abuse   . Hyperlipidemia   . Hypertension   . Mitral regurgitation   . Nonischemic cardiomyopathy ([place])    a. EF 10-15% on 11/2012 cath with no significant CAD. b. EF 25% in 2019. b. EF 15% in 12/2018  . PAF (paroxysmal atrial fibrillation) ([place])   . Protein calorie malnutrition ([place])   . Quit consuming alcohol in remote past   . Sinus bradycardia 12/21/2012  . Ventricular tachycardia ([place]) 12/14/2012    Past Surgical History:  Procedure Laterality Date  . BIOPSY  01/22/2019   Procedure: BIOPSY;  Surgeon: Wilford Corner, MD;  Location: [place];  Service: Endoscopy;;  . COLONOSCOPY WITH PROPOFOL N/A 03/19/2019   Procedure: COLONOSCOPY WITH PROPOFOL;  Surgeon: Otis Brace, MD;  Location: [place];  Service: Gastroenterology;  Laterality: N/A;  . ESOPHAGOGASTRODUODENOSCOPY (EGD) WITH PROPOFOL N/A 06/18/2017   Procedure: ESOPHAGOGASTRODUODENOSCOPY (EGD) WITH PROPOFOL;  Surgeon: Ronnette Juniper, MD;  Location: [place];  Service: Gastroenterology;  Laterality: N/A;  . ESOPHAGOGASTRODUODENOSCOPY (EGD) WITH PROPOFOL N/A 01/22/2019   Procedure: ESOPHAGOGASTRODUODENOSCOPY (EGD)  WITH PROPOFOL;  Surgeon: Wilford Corner, MD;  Location: [place];  Service: Endoscopy;  Laterality: N/A;  . ESOPHAGOGASTRODUODENOSCOPY (EGD) WITH PROPOFOL N/A 03/19/2019   Procedure: ESOPHAGOGASTRODUODENOSCOPY (EGD) WITH PROPOFOL;  Surgeon: Otis Brace, MD;  Location: [facility]COPY;  Service: Gastroenterology;  Laterality: N/A;  . IMPLANTABLE CARDIOVERTER DEFIBRILLATOR IMPLANT  12/19/2012   St. Jude dual-chamber ICD, serial number [id number]  . IMPLANTABLE CARDIOVERTER DEFIBRILLATOR IMPLANT N/A 12/19/2012   Procedure: IMPLANTABLE CARDIOVERTER DEFIBRILLATOR IMPLANT;  Surgeon: Deboraha Sprang, MD;  Location: [facility] CATH LAB;  Service: Cardiovascular;  Laterality: N/A;  . LEFT AND RIGHT HEART CATHETERIZATION WITH CORONARY ANGIOGRAM N/A 12/17/2012   Procedure: LEFT AND RIGHT HEART CATHETERIZATION WITH CORONARY ANGIOGRAM;  Surgeon: Sherren Mocha, MD;  Location: [facility] CATH LAB;  Service: Cardiovascular;  Laterality: N/A;  . POLYPECTOMY  03/19/2019   Procedure: POLYPECTOMY;  Surgeon: Otis Brace, MD;  Location: [place] [facility];  Service: Gastroenterology;;  . Clide Deutscher  01/22/2019   Procedure: Clide Deutscher;  Surgeon: Wilford Corner, MD;  Location: [facility] ENDOSCOPY;  Service: Endoscopy;;    Prior to Admission medications   Medication Sig Start Date End Date Taking? Authorizing Provider  acetaminophen (TYLENOL) 500 MG tablet Take 1,000 mg by mouth every 6 (six) hours as needed (pain).   Yes [provider]  albuterol (VENTOLIN HFA) 108 (90 Base) MCG/ACT inhaler Inhale 2 puffs into the lungs every 4 (four) hours as needed for wheezing or shortness of breath.   Yes [provider]  amiodarone (PACERONE) 200 MG tablet Take 1 tablet (200 mg total) by mouth 2 (two) times daily. 05/14/19 06/13/19 Yes Guilford Shi, MD  apixaban Arne [place])  2.5 MG TABS tablet Take 1 tablet (2.5 mg total) by mouth 2 (two) times daily. 03/19/19  Yes Debbe Odea, MD  atorvastatin (LIPITOR) 40 MG tablet Take  1 tablet (40 mg total) by mouth daily at 6 PM. 07/05/18  Yes Deboraha Sprang, MD  carvedilol (COREG) 3.125 MG tablet Take 1 tablet (3.125 mg total) by mouth 2 (two) times daily with a meal. 09/12/18  Yes Deboraha Sprang, MD  Cholecalciferol (VITAMIN D-3) 25 MCG (1000 UT) CAPS Take 1,000 Units by mouth at bedtime.    Yes [provider]  furosemide (LASIX) 80 MG tablet Take 0.5 tablets (40 mg total) by mouth daily. 04/19/19  Yes Nita Sells, MD  hydrALAZINE (APRESOLINE) 25 MG tablet Take 0.5 tablets (12.5 mg total) by mouth 3 (three) times daily. 01/12/19  Yes Guilford Shi, MD  magnesium oxide (MAGNESIUM-OXIDE) 400 (241.3 Mg) MG tablet Take 1 tablet (400 mg total) by mouth daily. 05/14/19  Yes Guilford Shi, MD  mexiletine (MEXITIL) 200 MG capsule Take 1 capsule (200 mg total) by mouth 2 (two) times daily. 01/24/19  Yes [name], PA-C  nitroGLYCERIN (NITROSTAT) 0.4 MG SL tablet Place 1 tablet (0.4 mg total) under the tongue every 5 (five) minutes as needed for chest pain. 01/12/19  Yes Guilford Shi, MD  ondansetron (ZOFRAN ODT) 4 MG disintegrating tablet Take 1 tablet (4 mg total) by mouth every 8 (eight) hours as needed for nausea or vomiting. 03/21/19  Yes [place] Eric J, DO  pantoprazole (PROTONIX) 40 MG tablet Take 1 tablet (40 mg total) by mouth daily. 04/05/19 07/04/19 Yes [place] Eric J, DO  senna-docusate (SENOKOT-S) 8.6-50 MG tablet Take 1 tablet by mouth at bedtime as needed for mild constipation. 05/10/19  Yes Charolotte Capuchin, MD  sucralfate (CARAFATE) 1 GM/10ML suspension Take 10 mLs (1 g total) by mouth 4 (four) times daily -  with meals and at bedtime. 03/19/19  Yes Debbe Odea, MD  Ensure (ENSURE) Take 237 mLs by mouth daily.    [provider]  ferrous gluconate (FERGON) 324 MG tablet Take 324 mg by mouth 2 (two) times daily.  07/05/18   [provider]  polyethylene glycol (MIRALAX / GLYCOLAX) 17 g packet Take 17 g by mouth daily. Can  take up to twice a day for constipation. Patient not taking: Reported on 05/27/2019 03/19/19   Debbe Odea, MD    Current Facility-Administered Medications  Medication Dose Route Frequency Provider Last Rate Last Dose  . 0.9 %  sodium chloride infusion  250 mL Intravenous PRN Etta Quill, DO      . acetaminophen (TYLENOL) tablet 650 mg  650 mg Oral Q4H PRN Etta Quill, DO      . amiodarone (PACERONE) tablet 200 mg  200 mg Oral BID Jennette Kettle M, DO   200 mg at 05/27/19 0849  . atorvastatin (LIPITOR) tablet 40 mg  40 mg Oral q1800 Etta Quill, DO      . carvedilol (COREG) tablet 3.125 mg  3.125 mg Oral BID WC Jennette Kettle M, DO   3.125 mg at 05/27/19 0854  . ferrous gluconate (FERGON) tablet 324 mg  324 mg Oral BID Etta Quill, DO   324 mg at 05/27/19 0850  . furosemide (LASIX) injection 40 mg  40 mg Intravenous Daily Jennette Kettle M, DO   40 mg at 05/27/19 0849  . hydrALAZINE (APRESOLINE) tablet 12.5 mg  12.5 mg Oral TID Etta Quill, DO   12.5  mg at 05/27/19 1318  . magnesium oxide (MAG-OX) tablet 400 mg  400 mg Oral Daily Jennette Kettle M, DO   400 mg at 05/27/19 0849  . mexiletine (MEXITIL) capsule 200 mg  200 mg Oral BID Jennette Kettle M, DO   200 mg at 05/27/19 0849  . pantoprazole (PROTONIX) EC tablet 40 mg  40 mg Oral Daily Jennette Kettle M, DO   40 mg at 05/27/19 0849  . polyethylene glycol (MIRALAX / GLYCOLAX) packet 17 g  17 g Oral Daily PRN Etta Quill, DO      . potassium chloride SA (KLOR-CON) CR tablet 40 mEq  40 mEq Oral Q4H Radene Gunning, NP   40 mEq at 05/27/19 1318  . sodium chloride flush (NS) 0.9 % injection 3 mL  3 mL Intravenous Q12H Jennette Kettle M, DO   3 mL at 05/27/19 0850  . sodium chloride flush (NS) 0.9 % injection 3 mL  3 mL Intravenous PRN Etta Quill, DO      . sucralfate (CARAFATE) 1 GM/10ML suspension 1 g  1 g Oral TID WC & HS Etta Quill, DO   1 g at 05/27/19 1317    Allergies as of 05/26/2019 - Review Complete  05/26/2019  Allergen Reaction Noted  . Strawberry extract Hives, Itching, and Swelling 12/14/2012    Family History  Problem Relation Age of Onset  . Cancer Mother     Social History   Socioeconomic History  . Marital status: Widowed    Spouse name: Not on file  . Number of children: Not on file  . Years of education: Not on file  . Highest education level: Not on file  Occupational History  . Not on file  Social Needs  . Financial resource strain: Not on file  . Food insecurity    Worry: Not on file    Inability: Not on file  . Transportation needs    Medical: Not on file    Non-medical: Not on file  Tobacco Use  . Smoking status: Former Research scientist (life sciences)  . Smokeless tobacco: Never Used  Substance and Sexual Activity  . Alcohol use: No  . Drug use: No  . Sexual activity: Not Currently  Lifestyle  . Physical activity    Days per week: Not on file    Minutes per session: Not on file  . Stress: Not on file  Relationships  . Social Herbalist on phone: Not on file    Gets together: Not on file    Attends religious service: Not on file    Active member of club or organization: Not on file    Attends meetings of clubs or organizations: Not on file    Relationship status: Not on file  . Intimate partner violence    Fear of current or ex partner: Not on file    Emotionally abused: Not on file    Physically abused: Not on file    Forced sexual activity: Not on file  Other Topics Concern  . Not on file  Social History Narrative  . Not on file    Review of Systems: As per HPI, all others negative  Physical Exam: Vital signs in last 24 hours: Temp:  [97.5 F (36.4 C)-98.4 F (36.9 C)] 98.4 F (36.9 C) (11/30 0830) Pulse Rate:  [58-78] 59 (11/30 1322) Resp:  [14-23] 14 (11/30 1322) BP: (98-139)/(41-96) 125/52 (11/30 1322) SpO2:  [88 %-100 %] 98 % (11/30 1322) Weight:  [  60.3 kg-60.8 kg] 60.8 kg (11/30 0655)   General:   Alert,  Chronically ill- and  deconditioned-appearing, but is in NAD Head:  Normocephalic and atraumatic. Eyes:  Sclera clear, no icterus.   Conjunctiva pink. Ears:  Normal auditory acuity. Nose:  No deformity, discharge,  or lesions. Mouth:  No deformity or lesions.  Oropharynx pink & moist. Neck:  Supple; no masses or thyromegaly. Lungs:  Clear throughout to auscultation.   No wheezes, crackles, or rhonchi. No acute distress. Heart:  Regular rate and rhythm; no murmurs, clicks, rubs,  or gallops. Abdomen:  Soft, generalized abdominal tenderness worse epigastric and right upper quadrant. Normal bowel sounds, without guarding, and without rebound.     Msk:  Symmetrical without gross deformities. Normal posture. Pulses:  Normal pulses noted. Extremities:  Without clubbing; 2+ pitting edema to mid shins bilateral lower extremities Neurologic:  Alert and  oriented x4; diffusely weak, otherwise grossly normal neurologically. Skin:  Intact without significant lesions or rashes. Psych:  Alert and cooperative. Normal mood and affect.   Lab Results: Recent Labs    05/26/19 1549 05/27/19 0656  WBC 16.0* 16.3*  HGB 7.7* 8.0*  HCT 23.2* 24.3*  PLT 313 342   BMET Recent Labs    05/26/19 1549  NA 141  K 3.1*  CL 105  CO2 26  GLUCOSE 96  BUN 30*  CREATININE 2.08*  CALCIUM 8.6*   LFT Recent Labs    05/26/19 1549  PROT 7.0  ALBUMIN 2.2*  AST 85*  ALT 39  ALKPHOS 98  BILITOT 1.2   PT/INR No results for input(s): LABPROT, INR in the last 72 hours.  Studies/Results: Ct Abdomen Pelvis Wo Contrast  Result Date: 05/26/2019 CLINICAL DATA:  73 year old female with history of abdominal distension. Diffuse abdominal pain, most severe on the right side. EXAM: CT ABDOMEN AND PELVIS WITHOUT CONTRAST TECHNIQUE: Multidetector CT imaging of the abdomen and pelvis was performed following the standard protocol without IV contrast. COMPARISON:  CT the abdomen and pelvis 03/17/2019. FINDINGS: Lower chest: Mild scarring in  the visualized lung bases. Cardiomegaly. Atherosclerotic calcifications in the left anterior descending, left circumflex and right coronary arteries. Pacemaker leads terminating in the right atrium and right ventricular apex. Hepatobiliary: Liver has a slightly shrunken appearance and nodular contour, suggesting underlying cirrhosis. No definite suspicious cystic or solid hepatic lesions are confidently identified on today's noncontrast CT examination. Unenhanced appearance of the gallbladder is normal. Pancreas: No definite pancreatic mass or peripancreatic fluid collections or inflammatory changes are noted on today's noncontrast CT examination. Spleen: Unremarkable. Adrenals/Urinary Tract: Multiple nonobstructive calculi are noted within the collecting systems of both kidneys, largest of which measures up to 4 mm in the lower pole collecting system of the right kidney. No additional calculi are confidently identified along the course of either ureter or within the lumen of the urinary bladder. No hydroureteronephrosis. Unenhanced appearance of the kidneys is otherwise normal. Left adrenal gland is normal in appearance. 4.5 x 2.1 cm low-attenuation (-27 HU) right adrenal nodule, similar to prior studies, compatible with an adrenal myelolipoma. Unenhanced appearance of the urinary bladder is normal. Stomach/Bowel: Unenhanced appearance of the stomach is normal. No pathologic dilatation of small bowel or colon. Multiple loops of small bowel appear to extend into a large right inguinal hernia. The appendix is not confidently identified and may be surgically absent. Regardless, there are no inflammatory changes noted adjacent to the cecum to suggest the presence of an acute appendicitis at this time. Numerous colonic  diverticulae are noted, without surrounding inflammatory changes to suggest an acute diverticulitis at this time. Vascular/Lymphatic: Aortic atherosclerosis. No lymphadenopathy noted in the abdomen or  pelvis confidently identified on today's noncontrast CT examination. Reproductive: Uterus is markedly heterogeneous in appearance with multiple coarse calcifications, compatible with a fibroid uterus. Ovaries are unremarkable in appearance. Other: Trace volume of ascites.  No pneumoperitoneum. Musculoskeletal: There are no aggressive appearing lytic or blastic lesions noted in the visualized portions of the skeleton. IMPRESSION: 1. Large right inguinal hernia containing several loops of small bowel, without evidence of bowel incarceration or obstruction. 2. Numerous small nonobstructive calculi are noted in the collecting systems of both kidneys measuring up to 4 mm in the lower pole. No ureteral stones or findings of urinary tract obstruction are noted at this time. 3. Colonic diverticulosis without evidence of acute diverticulitis at this time. 4. Severe cardiomegaly. 5. Aortic atherosclerosis, in addition to least 3 vessel coronary artery disease. Assessment for potential risk factor modification, dietary therapy or pharmacologic therapy may be warranted, if clinically indicated. 6. Morphologic changes in the liver suggestive of underlying cirrhosis. 7. Additional incidental findings, as above. Electronically Signed   By: Vinnie Langton M.D.   On: 05/26/2019 18:27   Dg Chest 2 View  Result Date: 05/26/2019 CLINICAL DATA:  Patient with right-sided abdominal pain. History of CHF. EXAM: CHEST - 2 VIEW COMPARISON:  Chest radiograph 02/20/2019 FINDINGS: Multi lead AICD device overlies the left hemithorax. Leads are stable in position. Stable cardiomegaly. Tortuosity of the thoracic aorta. Heterogeneous opacities left lung base. Pulmonary vascular redistribution. Trace bilateral pleural effusions. Thoracic spine degenerative changes. IMPRESSION: Cardiomegaly with pulmonary vascular redistribution and mild interstitial edema. Heterogeneous opacities left lung base may represent atelectasis. Probable trace  bilateral pleural effusions. Electronically Signed   By: Lovey Newcomer M.D.   On: 05/26/2019 20:01    Impression:  1.  Acute on chronic anemia.  Likely multifactorial.  While hemoccult-positive, I do not suspect high volume overt GI bleeding.  She has had recent endoscopy and colonoscopy; she has had antropyloric gastritis seen on several different endoscopies (which could, over time and in setting of anticoagulation, lead to downward trends in hgb), while no clear nidus of bleeding seen on colonoscopy. 2.  Heart failure exacerbation.  Most recent ejection fraction 15%. 3.  Gallbladder mass.  Not felt surgical candidate. 4.  Large right inguinal hernia, not incarcerated. 5.  Cirrhotic-appearing liver on imaging studies. 6.  Ongoing upper abdominal pain, likely at least principally from #3 above. 7.  Paroxysmal atrial fibrillation, on chronic anticoagulation.  Plan:  1.  Typically one might consider capsule endoscopy for further investigation.  However, given patient's significant heart failure and multiple other comorbidities, I very much doubt she would be candidate for balloon enteroscopy should vascular anomaly be seen (procedure of which is done at tertiary center and under general anesthesia) or for surgery should small bowel mass lesion be seen (which would require general anesthesia).   As a result, I would not pursue capsule endoscopy unless in face of rampant overt bleeding, in which case with the understanding that patient would require tertiary center transfer.  She has had several endoscopies, last within past couple months, and colonoscopy, within past couple months; I do not see the utility of repeating these procedures. 2.  Consider setting up patient for ongoing iron infusions, both during this hospitalization and regularly as outpatient.   3.  Pantoprazole 40 mg po qd (or equivalent) now and indefinitely upon hospital  discharge. 4.  Anticoagulation is currently on hold.  If patient  absolutely requires ongoing anticoagulation, then it should be with understanding that periodic (yet unpredictable) downtrends of her Hgb could ensue (which might even require blood transfusion), but hopefully iron transfusion will help mitigate this possibility. 5.  Advancing diet as tolerated from GI perspective is fine.  No further endoscopic work-up is anticipated. 6.  [facility] will sign-off; please call with questions; thank you for the consultation.    LOS: 1 day   Dahlila Pfahler M  05/27/2019, 2:35 PM  Cell [phone] If no answer or after 5 PM call [phone]

## 2019-05-27 NOTE — ED Notes (Signed)
Hospital bed ordered for patient.

## 2019-05-27 NOTE — Care Management CC44 (Signed)
Condition Code 44 Documentation Completed  Patient Details  Name: Stephanie Yates MRN: [id number] Date of Birth: 08-May-1946   Condition Code 44 given:  Yes Patient signature on Condition Code 44 notice:  Yes Documentation of 2 MD's agreement:  Yes Code 44 added to claim:  Yes    Bethena Roys, RN 05/27/2019, 11:40 AM

## 2019-05-27 NOTE — Progress Notes (Addendum)
Patient admitted to observation after midnight.  Treatment began before midnight.  Patient presents with intermittent persistent abdominal pain shortness of breath lower extremity edema. Hx if chf, afib, NICM s/p AICD, ckd gallbladder mass. Evaluation reveals acute on chronic systolic heart failure and concern for acute GI bleed/symptomatic anemia.  BNP greater than 1500, chest x-ray with cardiomegaly and pulmonary vascular redistribution and mild interstitial edema, hemoglobin 8.0 down from 10.1 3 weeks ago.   A/P  #1. acute on chronic systolic heart failure.  Patient was provided with IV Lasix in the emergency department.  BNP elevated.  Echo done July of this year reveals an EF of 15%.  Has had multiple admissions for same.  Last admission evaluated by palliative care. -Lasix IV -Daily weights -Intake and output  #2.  Anemia.  Hemoglobin was significant drop last 3 weeks.  Heme positive stool and is so chronically.  History of same.  Diet review indicates patient with endoscopy and colonoscopy September of this year feeling gastritis and multiple polyps. -GI consult -iron studies -Monitor -PI -Hold Eliquis  #3.  Abdominal pain.  Patient with history of same.  History of gallbladder mass.  Evaluated by surgery in the past not a great surgical candidate.  CT scan without evidence of obstruction or incarceration of hernia. -supportive therapy -gi consult  #4.  Hypertension.  Fair control. -Continue home meds  #5.  PAF. -hold eliquis   Santiago Glad black, np

## 2019-05-27 NOTE — TOC Initial Note (Signed)
Transition of Care [facility]) - Initial/Assessment Note    Patient Details  Name: Stephanie Yates MRN: [id number] Date of Birth: [birth year]  Transition of Care [facility]) CM/SW Contact:    Bethena Roys, RN Phone Number: 05/27/2019, 4:57 PM  Clinical Narrative: Readmission risk assessment completed. Patient presented for acute on chronic systolic heart failure. PTA from home alone. Patient is active with [facility] for RN, PT,OT- pt will need resumption orders if the plan continues to be home. Pt sees [facility] outpatient. Pt has transportation to and from MD appointments and she states she gets her medications without any problems. CM will continue to follow for additional transition of care needs.                  Expected Discharge Plan: [place] Barriers to Discharge: Continued Medical Work up   Patient Goals and CMS Choice Patient states their goals for this hospitalization and ongoing recovery are:: "to feel better"   Choice offered to / list presented to : NA  Expected Discharge Plan and Services Expected Discharge Plan: [place] In-house Referral: NA Discharge Planning Services: CM Consult Post Acute Care Choice: Home Health, Resumption of Svcs/PTA Provider Living arrangements for the past 2 months: Single Family Home                 HH Arranged: OT [place] Agency: [facility] Date [place]: 05/27/19 Time [place]: [id number] Representative spoke with at [place] Arrangements/Services Living arrangements for the past 2 months: [place] with:: Self Patient language and need for interpreter reviewed:: Yes Do you feel safe going back to the place where you live?: Yes      Need for Family Participation in Patient Care: No (Comment) Care giver support system in place?: No (comment) Current home services: Home PT, Home RN([facility]) Criminal Activity/Legal Involvement  Pertinent to Current Situation/Hospitalization: No - Comment as needed  Activities of Daily Living Home Assistive Devices/Equipment: Walker (specify type) ADL Screening (condition at time of admission) Patient's cognitive ability adequate to safely complete daily activities?: Yes Is the patient deaf or have difficulty hearing?: No Does the patient have difficulty seeing, even when wearing glasses/contacts?: Yes(wears glasses) Does the patient have difficulty concentrating, remembering, or making decisions?: No Patient able to express need for assistance with ADLs?: Yes Does the patient have difficulty dressing or bathing?: No Independently performs ADLs?: Yes (appropriate for developmental age) Does the patient have difficulty walking or climbing stairs?: No Weakness of Legs: Right Weakness of Arms/Hands: None  Permission Sought/Granted Permission sought to share information with : Facility Art therapist granted to share information with : Yes, Verbal Permission Granted     Permission granted to share info w AGENCY: [facility]- [place]        Emotional Assessment Appearance:: Appears stated age Attitude/Demeanor/Rapport: Engaged Affect (typically observed): Accepting Orientation: : Oriented to Situation, Oriented to  Time, Oriented to Place, Oriented to Self Alcohol / Substance Use: Not Applicable Psych Involvement: No (comment)  Admission diagnosis:  Generalized abdominal pain [R10.84] Anemia, unspecified type [D64.9] Acute on chronic congestive heart failure, unspecified heart failure type [facility]) [I50.9] Patient Active Problem List   Diagnosis Date Noted  . Acute on chronic systolic CHF (congestive heart failure) ([place]) 05/27/2019  . Torsades de pointes ([place]) 05/14/2019  . Weakness generalized   . Palliative care by specialist   . Gallbladder mass 05/06/2019  .  Volume overload 05/06/2019  . Vertigo 04/15/2019  . Abnormal liver function 04/15/2019  .  Right adrenal mass ([place]) possible adenoma 03/18/2019  . Nephrolithiasis 03/18/2019  . Prolonged QT interval 03/17/2019  . Renal failure (ARF), acute on chronic ([place]) 03/17/2019  . Nausea and vomiting 03/17/2019  . Epigastric abdominal pain 01/29/2019  . Gastritis 01/28/2019  . CKD (chronic kidney disease) stage 4, GFR 15-29 ml/min (HCC) 01/28/2019  . NSVT (nonsustained ventricular tachycardia) ([place]) 01/17/2019  . AKI (acute kidney injury) ([place]) 01/12/2019  . DNR (do not resuscitate) discussion   . CHF exacerbation ([place]) 01/06/2019  . Influenza A 09/02/2018  . Lactic acidosis 09/02/2018  . Chronic combined systolic and diastolic congestive heart failure ([place]) 09/02/2018  . Rapid atrial fibrillation ([place]) 06/08/2018  . Acute on chronic combined systolic and diastolic heart failure ([place]) 06/07/2018  . Anemia 06/07/2018  . Acute GI bleeding 06/15/2017  . Chronic anticoagulation   . Acute on chronic congestive heart failure ([place])   . HLD (hyperlipidemia) 03/05/2017  . CKD (chronic kidney disease), stage III 03/05/2017  . Chest pain 03/05/2017  . Symptomatic anemia 03/05/2017  . PAF (paroxysmal atrial fibrillation) ([place]) 04/01/2013  . ICD (implantable cardioverter-defibrillator), dual, [place] 04/01/2013  . PVC's (premature ventricular contractions) 04/01/2013  . Hypokalemia 12/21/2012  . History of tobacco abuse 12/21/2012  . Transaminitis 12/21/2012  . Sinus bradycardia 12/21/2012  . Hyperkalemia 12/20/2012  . Nonischemic cardiomyopathy ([place]) 12/20/2012  . UTI (urinary tract infection) 12/20/2012  . Acute on chronic combined systolic and diastolic CHF (congestive heart failure) ([place]) 12/20/2012  . Ventricular tachycardia ([place]) 12/14/2012  . Hypertension 12/14/2012   PCP:  Josetta Huddle, MD Pharmacy:   [facility] [place], [place] - [facility] AT [place] [address] [place] [address] Phone: [phone] Fax:  [phone]     Social Determinants of Health (SDOH) Interventions    Readmission Risk Interventions Readmission Risk Prevention Plan 05/27/2019 05/10/2019 01/24/2019  Transportation Screening Complete Complete Complete  PCP or Specialist Appt within 3-5 Days - - Complete  HRI or [place] - - Complete  Social Work Consult for [place] Planning/Counseling - - Complete  Palliative Care Screening - - Not Applicable  Medication Review Press photographer) Complete Complete Complete  Med Review Comments - - -  PCP or Specialist appointment within 3-5 days of discharge Complete Complete -  [place] or Home Care Consult Complete Complete -  SW Recovery Care/Counseling Consult Complete Complete -  Palliative Care Screening Not Applicable Not Applicable -  [place] Not Applicable Not Applicable -  Some recent data might be hidden

## 2019-05-28 DIAGNOSIS — I5023 Acute on chronic systolic (congestive) heart failure: Secondary | ICD-10-CM

## 2019-05-28 LAB — BASIC METABOLIC PANEL
Anion gap: 8 (ref 5–15)
BUN: 32 mg/dL — ABNORMAL HIGH (ref 8–23)
CO2: 25 mmol/L (ref 22–32)
Calcium: 8.3 mg/dL — ABNORMAL LOW (ref 8.9–10.3)
Chloride: 108 mmol/L (ref 98–111)
Creatinine, Ser: 2.17 mg/dL — ABNORMAL HIGH (ref 0.44–1.00)
GFR calc Af Amer: 25 mL/min — ABNORMAL LOW (ref >60)
GFR calc non Af Amer: 22 mL/min — ABNORMAL LOW (ref >60)
Glucose, Bld: 110 mg/dL — ABNORMAL HIGH (ref 70–99)
Potassium: 5.3 mmol/L — ABNORMAL HIGH (ref 3.5–5.1)
Sodium: 141 mmol/L (ref 135–145)

## 2019-05-28 LAB — CBC
HCT: 23.4 % — ABNORMAL LOW (ref 36.0–46.0)
Hemoglobin: 7.7 g/dL — ABNORMAL LOW (ref 12.0–15.0)
MCH: 25.7 pg — ABNORMAL LOW (ref 26.0–34.0)
MCHC: 32.9 g/dL (ref 30.0–36.0)
MCV: 78 fL — ABNORMAL LOW (ref 80.0–100.0)
Platelets: 314 10*3/uL (ref 150–400)
RBC: 3 MIL/uL — ABNORMAL LOW (ref 3.87–5.11)
RDW: 15.1 % (ref 11.5–15.5)
WBC: 18.5 10*3/uL — ABNORMAL HIGH (ref 4.0–10.5)
nRBC: 0 % (ref 0.0–0.2)

## 2019-05-28 LAB — RETICULOCYTES
Immature Retic Fract: 22.8 % — ABNORMAL HIGH (ref 2.3–15.9)
RBC.: 3.23 MIL/uL — ABNORMAL LOW (ref 3.87–5.11)
Retic Count, Absolute: 76.9 10*3/uL (ref 19.0–186.0)
Retic Ct Pct: 2.4 % (ref 0.4–3.1)

## 2019-05-28 LAB — FERRITIN: Ferritin: 19 ng/mL (ref 11–307)

## 2019-05-28 LAB — MAGNESIUM: Magnesium: 2.1 mg/dL (ref 1.7–2.4)

## 2019-05-28 LAB — FOLATE: Folate: 14.6 ng/mL (ref >5.9)

## 2019-05-28 LAB — VITAMIN B12: Vitamin B-12: 755 pg/mL (ref 180–914)

## 2019-05-28 LAB — IRON AND TIBC
Iron: 36 ug/dL (ref 28–170)
Saturation Ratios: 12 % (ref 10.4–31.8)
TIBC: 311 ug/dL (ref 250–450)
UIBC: 275 ug/dL

## 2019-05-28 MED ORDER — FUROSEMIDE 10 MG/ML IJ SOLN: 80.0000 mg | Freq: Every day | INTRAMUSCULAR | Status: DC

## 2019-05-28 MED ORDER — FUROSEMIDE 10 MG/ML IJ SOLN
40.0000 mg | Freq: Once | INTRAMUSCULAR | Status: AC
  Administered 2019-05-28: 40 mg via INTRAVENOUS
  Filled 2019-05-28: qty 4

## 2019-05-28 NOTE — Consult Note (Addendum)
Advanced Heart Failure Team Consult Note   Primary Physician: Josetta Huddle, MD PCP-Cardiologist:  Dr. Caryl Comes   Reason for Consultation: acute on chronic systolic heart failure   HPI:    Stephanie Yates is seen today for evaluation of acute on chronic systolic heart failure at the request of Dr. Cyndia Skeeters, Internal Medicine.  Stephanie Yates is a 73 y.o. female with a hx of chronic combined CHF/NICM with paroxysmal sustained ventricular tachycardia s/p SJM AICD 2014, PAF, bradycardia requiring adjustment in lower device rate, mitral regurgitation, HTN, HLD, CKD stage IV (baseline 1.7-2.1), chronic anemia, moderate protein-calorie malnutrition, h/o GIBs (ulcers, gastritis, gastric polyp), prior tobacco use and prior remote ETOH abuse (quit 20 y/a).   Primary followed by Dr. Caryl Comes. Was admitted mid 4/27 for a/c systolic HF w/ volume overload. Echo showed further drop in EF from previous baseline of 20-25%, to 15%. RV was normal. She was treated w/ IV diuretics w/ improvement in volume and symptoms. Was transitioned to PO lasix w/ order for PNR metolazone at discharge and enrolled in the [facility] HF program with REDS monitoring. Also treated w/ GDMT w/  blocker, hydralazine + nitrate. No ARB, dig or spiro due to CKD.   She was readmitted late 12/2018 by EP for VT storm. Treated w/ IV amiodarone>>transitioend PO amiodarone + mexiletine.   Multiple hospital admissions since July for various issues, mostly GI complaints w/ abdominal pain. Also 1 admission for vertigo. Her last admission was last month and was for a/c systolic HF. Admitted by IM on 11/18. She had presented w/ worsening SOB and LEE. BNP was 1012 and CXR showed central venous congestion. Was treated w/ IV diuretics. Hospital course also notable for a very brief episode of polymorphic nonsustained V. tach-torsades pattern but she remained asymptomatic w/o AICD discharge. Palliative care was consulted that admit to discuss goals of care  given severe LV dysfunction and multiple recurrent hospitalizations over the last year. Pt refused home hospice but agreed to continue w/ outpatient community based palliative care services. Her d/c wt on 11/17 was 132 lb. Was discharged home on 40 mg of lasix daily (previously prescribed 80 of lasix daily w/ PRN metolazone order). Not felt to be a candidate for advanced therapies (I.e VAD) given debility and renal failure. Also with h/o recurrent VT, not a candidate for home inotrope's.    She presented back to ED yesterday w/ complaints of abdominal pain + n/v. Symptoms worse after meals. Also noted constipation. Found to be anemic w/ Hgb down to 7.7, drop in hgb ~ 3 grams over the past few weeks; no overt GI bleeding (no hematemesis, melena, hematochezia but FOBT +. CT abd /pelvis shows large R inguinal hernia but no evidence of obstruction or incarceration. GI consulted but in the absence of overt GI bleeding, no further w/u recommended at this time. Per Dr. Paulita Fujita, "Typically one might consider capsule endoscopy for further investigation.  However, given patient's significant heart failure and multiple other comorbidities, I very much doubt she would be candidate for balloon enteroscopy should vascular anomaly be seen (procedure of which is done at tertiary center and under general anesthesia) or for surgery should small bowel mass lesion be seen (which would require general anesthesia).   As a result, I would not pursue capsule endoscopy unless in face of rampant overt bleeding, in which case with the understanding that patient would require tertiary center transfer.  She has had several endoscopies, last within past couple months, and  colonoscopy, within past couple months; I do not see the utility of repeating these procedures". Her hgb has remained fairly stable, 7.7>>8.0>>7.7. Has not required transfusions. On ferrous gluconate.   Pt also being treated for a/c systolic HF. Admit CXR showed mild  pulmonary edema w/ vascular congestion. BNP 1587. SCr 2.0 (baseline ~1.8-2.0). Wt today is 135 lb. Getting IV Lasix 40 mg once daily. UOP is poor. Only 500 cc documented yesterday. Mild bump in SCr from 2.08>>2.17. K 5.3 this am. Also w/ leukocytosis. WBC ct 16.3>>18.5. Afebrile. Denies cough, fever, chills, dysuria.  Had negative UA on 11/26. Lactic acid negative on admit.   Feel SOB at rest. On 3L [place]. No supp O2 requirements at home.   Echo 7/20 EF 15% w/ diffuse hypokinesis. No LV thrombus. Normal RV. Functional MR (mild-moderate).   Review of Systems: [y] = yes, [ ]  = no    General: Weight gain [ ] ; Weight loss [ ] ; Anorexia [ ] ; Fatigue [ y]; Fever [ ] ; Chills [ ] ; Weakness [ ]    Cardiac: Chest pain/pressure [ ] ; Resting SOB [ y]; Exertional SOB Blue.Reese ]; Orthopnea [ ] ; Pedal Edema [ ] ; Palpitations [ ] ; Syncope [ ] ; Presyncope [ ] ; Paroxysmal nocturnal dyspnea[ ]    Pulmonary: Cough [ ] ; Wheezing[ ] ; Hemoptysis[ ] ; Sputum [ ] ; Snoring [ ]    GI: Vomiting[ y]; Dysphagia[ ] ; Melena[ ] ; Hematochezia [ ] ; Heartburn[ ] ; Abdominal pain Blue.Reese ]; Constipation [ ] ; Diarrhea [ ] ; BRBPR [ ]    GU: Hematuria[ ] ; Dysuria [ ] ; Nocturia[ ]    Vascular: Pain in legs with walking [ ] ; Pain in feet with lying flat [ ] ; Non-healing sores [ ] ; Stroke [ ] ; TIA [ ] ; Slurred speech [ ] ;   Neuro: Headaches[ ] ; Vertigo[ ] ; Seizures[ ] ; Paresthesias[ ] ;Blurred vision [ ] ; Diplopia [ ] ; Vision changes [ ]    Ortho/Skin: Arthritis [ ] ; Joint pain [ ] ; Muscle pain [ ] ; Joint swelling [ ] ; Back Pain [ ] ; Rash [ ]    Psych: Depression[ ] ; Anxiety[ ]    Heme: Bleeding problems [ ] ; Clotting disorders [ ] ; Anemia [ ]    Endocrine: Diabetes [ ] ; Thyroid dysfunction[ ]   Home Medications Prior to Admission medications   Medication Sig Start Date End Date Taking? Authorizing Provider  acetaminophen (TYLENOL) 500 MG tablet Take 1,000 mg by mouth every 6 (six) hours as needed (pain).   Yes [provider]  albuterol  (VENTOLIN HFA) 108 (90 Base) MCG/ACT inhaler Inhale 2 puffs into the lungs every 4 (four) hours as needed for wheezing or shortness of breath.   Yes [provider]  amiodarone (PACERONE) 200 MG tablet Take 1 tablet (200 mg total) by mouth 2 (two) times daily. 05/14/19 06/13/19 Yes Guilford Shi, MD  apixaban (ELIQUIS) 2.5 MG TABS tablet Take 1 tablet (2.5 mg total) by mouth 2 (two) times daily. 03/19/19  Yes Debbe Odea, MD  atorvastatin (LIPITOR) 40 MG tablet Take 1 tablet (40 mg total) by mouth daily at 6 PM. 07/05/18  Yes Deboraha Sprang, MD  carvedilol (COREG) 3.125 MG tablet Take 1 tablet (3.125 mg total) by mouth 2 (two) times daily with a meal. 09/12/18  Yes Deboraha Sprang, MD  Cholecalciferol (VITAMIN D-3) 25 MCG (1000 UT) CAPS Take 1,000 Units by mouth at bedtime.    Yes [provider]  furosemide (LASIX) 80 MG tablet Take 0.5 tablets (40 mg total) by mouth daily. 04/19/19  Yes Nita Sells, MD  hydrALAZINE (APRESOLINE)  25 MG tablet Take 0.5 tablets (12.5 mg total) by mouth 3 (three) times daily. 01/12/19  Yes Guilford Shi, MD  magnesium oxide (MAGNESIUM-OXIDE) 400 (241.3 Mg) MG tablet Take 1 tablet (400 mg total) by mouth daily. 05/14/19  Yes Guilford Shi, MD  mexiletine (MEXITIL) 200 MG capsule Take 1 capsule (200 mg total) by mouth 2 (two) times daily. 01/24/19  Yes [name], PA-C  nitroGLYCERIN (NITROSTAT) 0.4 MG SL tablet Place 1 tablet (0.4 mg total) under the tongue every 5 (five) minutes as needed for chest pain. 01/12/19  Yes Guilford Shi, MD  ondansetron (ZOFRAN ODT) 4 MG disintegrating tablet Take 1 tablet (4 mg total) by mouth every 8 (eight) hours as needed for nausea or vomiting. 03/21/19  Yes [place] Eric J, DO  pantoprazole (PROTONIX) 40 MG tablet Take 1 tablet (40 mg total) by mouth daily. 04/05/19 07/04/19 Yes [place] Eric J, DO  senna-docusate (SENOKOT-S) 8.6-50 MG tablet Take 1 tablet by mouth at bedtime as needed for mild  constipation. 05/10/19  Yes Charolotte Capuchin, MD  sucralfate (CARAFATE) 1 GM/10ML suspension Take 10 mLs (1 g total) by mouth 4 (four) times daily -  with meals and at bedtime. 03/19/19  Yes Debbe Odea, MD  Ensure (ENSURE) Take 237 mLs by mouth daily.    [provider]  ferrous gluconate (FERGON) 324 MG tablet Take 324 mg by mouth 2 (two) times daily.  07/05/18   [provider]  polyethylene glycol (MIRALAX / GLYCOLAX) 17 g packet Take 17 g by mouth daily. Can take up to twice a day for constipation. Patient not taking: Reported on 05/27/2019 03/19/19   Debbe Odea, MD    Past Medical History: Past Medical History:  Diagnosis Date   Chronic anemia    Chronic systolic CHF (congestive heart failure) ([place])    CKD (chronic kidney disease), stage IV ([place])    Former tobacco use    History of tobacco abuse    Hyperlipidemia    Hypertension    Mitral regurgitation    Nonischemic cardiomyopathy ([place])    a. EF 10-15% on 11/2012 cath with no significant CAD. b. EF 25% in 2019. b. EF 15% in 12/2018   PAF (paroxysmal atrial fibrillation) ([facility])    Protein calorie malnutrition ([place])    Quit consuming alcohol in remote past    Sinus bradycardia 12/21/2012   Ventricular tachycardia ([place]) 12/14/2012    Past Surgical History: Past Surgical History:  Procedure Laterality Date   BIOPSY  01/22/2019   Procedure: BIOPSY;  Surgeon: Wilford Corner, MD;  Location: [place];  Service: Endoscopy;;   COLONOSCOPY WITH PROPOFOL N/A 03/19/2019   Procedure: COLONOSCOPY WITH PROPOFOL;  Surgeon: Otis Brace, MD;  Location: [place];  Service: Gastroenterology;  Laterality: N/A;   ESOPHAGOGASTRODUODENOSCOPY (EGD) WITH PROPOFOL N/A 06/18/2017   Procedure: ESOPHAGOGASTRODUODENOSCOPY (EGD) WITH PROPOFOL;  Surgeon: Ronnette Juniper, MD;  Location: [place];  Service: Gastroenterology;  Laterality: N/A;   ESOPHAGOGASTRODUODENOSCOPY (EGD) WITH PROPOFOL N/A 01/22/2019    Procedure: ESOPHAGOGASTRODUODENOSCOPY (EGD) WITH PROPOFOL;  Surgeon: Wilford Corner, MD;  Location: [place];  Service: Endoscopy;  Laterality: N/A;   ESOPHAGOGASTRODUODENOSCOPY (EGD) WITH PROPOFOL N/A 03/19/2019   Procedure: ESOPHAGOGASTRODUODENOSCOPY (EGD) WITH PROPOFOL;  Surgeon: Otis Brace, MD;  Location: [place];  Service: Gastroenterology;  Laterality: N/A;   IMPLANTABLE CARDIOVERTER DEFIBRILLATOR IMPLANT  12/19/2012   St. Jude dual-chamber ICD, serial number [id number]   IMPLANTABLE CARDIOVERTER DEFIBRILLATOR IMPLANT N/A 12/19/2012   Procedure: IMPLANTABLE CARDIOVERTER DEFIBRILLATOR IMPLANT;  Surgeon: Remo Lipps  Peterson Lombard, MD;  Location: [facility] CATH LAB;  Service: Cardiovascular;  Laterality: N/A;   LEFT AND RIGHT HEART CATHETERIZATION WITH CORONARY ANGIOGRAM N/A 12/17/2012   Procedure: LEFT AND RIGHT HEART CATHETERIZATION WITH CORONARY ANGIOGRAM;  Surgeon: Sherren Mocha, MD;  Location: [facility] CATH LAB;  Service: Cardiovascular;  Laterality: N/A;   POLYPECTOMY  03/19/2019   Procedure: POLYPECTOMY;  Surgeon: Otis Brace, MD;  Location: [facility] ENDOSCOPY;  Service: Gastroenterology;;   Clide Deutscher  01/22/2019   Procedure: Clide Deutscher;  Surgeon: Wilford Corner, MD;  Location: [facility] ENDOSCOPY;  Service: Endoscopy;;    Family History: Family History  Problem Relation Age of Onset   Cancer Mother     Social History: Social History   Socioeconomic History   Marital status: Widowed    Spouse name: Not on file   Number of children: Not on file   Years of education: Not on file   Highest education level: Not on file  Occupational History   Not on file  Social Needs   Financial resource strain: Not on file   Food insecurity    Worry: Not on file    Inability: Not on file   Transportation needs    Medical: Not on file    Non-medical: Not on file  Tobacco Use   Smoking status: Former Smoker   Smokeless tobacco: Never Used  Substance and Sexual Activity    Alcohol use: No   Drug use: No   Sexual activity: Not Currently  Lifestyle   Physical activity    Days per week: Not on file    Minutes per session: Not on file   Stress: Not on file  Relationships   Social connections    Talks on phone: Not on file    Gets together: Not on file    Attends religious service: Not on file    Active member of club or organization: Not on file    Attends meetings of clubs or organizations: Not on file    Relationship status: Not on file  Other Topics Concern   Not on file  Social History Narrative   Not on file    Allergies:  Allergies  Allergen Reactions   Strawberry Extract Hives, Itching and Swelling         Objective:    Vital Signs:   Temp:  [97.5 F (36.4 C)-97.9 F (36.6 C)] 97.9 F (36.6 C) (12/01 0826) Pulse Rate:  [58-68] 63 (12/01 0921) Resp:  [14-26] 24 (12/01 0826) BP: (109-151)/(49-70) 127/59 (12/01 0921) SpO2:  [95 %-100 %] 100 % (12/01 0826) Weight:  [61.6 kg] 61.6 kg (12/01 0500) Last BM Date: 05/26/19  Weight change: Filed Weights   05/26/19 1829 05/27/19 0655 05/28/19 0500  Weight: 60.3 kg 60.8 kg 61.6 kg    Intake/Output:   Intake/Output Summary (Last 24 hours) at 05/28/2019 1109 Last data filed at 05/28/2019 0830 Gross per 24 hour  Intake 945 ml  Output 500 ml  Net 445 ml      Physical Exam    General:  Weak and frail appearing elderly AAF. No resp difficulty HEENT: normal Neck: supple. elevated JVP . Carotids 2+ bilat; no bruits. No lymphadenopathy or thyromegaly appreciated. Cor: PMI nondisplaced. Regular rate & rhythm. No rubs, gallops or murmurs. Lungs: faint left basilar crackles, otherwise CTA Abdomen: soft, nontender, nondistended. No hepatosplenomegaly. No bruits or masses. Good bowel sounds. Extremities: no cyanosis, clubbing, rash, 1+ bilateral LEE edema Neuro: alert & orientedx3, cranial nerves grossly intact. moves all 4 extremities  w/o difficulty. Affect pleasant   Telemetry     A paced 70s. Brief Polymorphic VT around 7:20 AM today.   EKG    A paced 60 bpm   Labs   Basic Metabolic Panel: Recent Labs  Lab 05/23/19 0056 05/26/19 1549 05/28/19 0408  NA 142 141 141  K 3.5 3.1* 5.3*  CL 105 105 108  CO2 25 26 25   GLUCOSE 98 96 110*  BUN 28* 30* 32*  CREATININE 2.16* 2.08* 2.17*  CALCIUM 8.9 8.6* 8.3*    Liver Function Tests: Recent Labs  Lab 05/26/19 1549  AST 85*  ALT 39  ALKPHOS 98  BILITOT 1.2  PROT 7.0  ALBUMIN 2.2*   Recent Labs  Lab 05/26/19 1549  LIPASE 28   No results for input(s): AMMONIA in the last 168 hours.  CBC: Recent Labs  Lab 05/23/19 0056 05/26/19 1549 05/27/19 0656 05/28/19 0408  WBC 18.5* 16.0* 16.3* 18.5*  HGB 8.4* 7.7* 8.0* 7.7*  HCT 26.0* 23.2* 24.3* 23.4*  MCV 80.7 79.5* 78.6* 78.0*  PLT 415* 313 342 314    Cardiac Enzymes: No results for input(s): CKTOTAL, CKMB, CKMBINDEX, TROPONINI in the last 168 hours.  BNP: BNP (last 3 results) Recent Labs    05/05/19 1849 05/23/19 0057 05/26/19 1822  BNP 1,012.9* 1,286.3* 1,587.2*    ProBNP (last 3 results) No results for input(s): PROBNP in the last 8760 hours.   CBG: No results for input(s): GLUCAP in the last 168 hours.  Coagulation Studies: No results for input(s): LABPROT, INR in the last 72 hours.   Imaging    No results found.   Medications:     Current Medications:  amiodarone  200 mg Oral BID   atorvastatin  40 mg Oral q1800   carvedilol  3.125 mg Oral BID WC   ferrous gluconate  324 mg Oral BID   furosemide  40 mg Intravenous Daily   hydrALAZINE  12.5 mg Oral TID   magnesium oxide  400 mg Oral Daily   mexiletine  200 mg Oral BID   pantoprazole  40 mg Oral Daily   sodium chloride flush  3 mL Intravenous Q12H   sucralfate  1 g Oral TID WC & HS     Infusions:  sodium chloride         Patient Profile   Stephanie Yates is a 73 y.o. female with a hx of chronic combined CHF/NICM EF 15% with paroxysmal  sustained ventricular tachycardia s/p SJM AICD 2014, PAF, bradycardia requiring adjustment in lower device rate, mitral regurgitation, HTN, HLD, CKD stage IV (baseline 1.7-2.1), chronic anemia, moderate protein-calorie malnutrition, h/o GIBs (ulcers, gastritis, gastric polyp), prior tobacco use and prior remote ETOH abuse (quit 20 y/a), multiple hospital admissions over the last 6 months and failure to thrive admitted w/ acute on chronic systolic CHF.   Assessment/Plan   1. Acute on Chronic Systolic CHF: NICM. Most recent echo 7/20 showed further reduction in EF, down from previous baseline of 20-25% to 15%. GDMT limited by CKD. Not felt to be a candidate for advanced therapies (I.e VAD) given debility and renal failure. Also with h/o recurrent VT, not a candidate for home inotrope's.   - NYHA Class IIIb-IV symptoms. Physical exam, BNP and CXR c/w volume overload.  - Limited therapies as outlined above. Continue IV lasix for palliation. Poor UOP thus far w/ only 500 cc out after IV lasix 40 mg. Will increase to 80 mg daily and monitor response. If continued  poor response, can try trial of metolazone w/ tomorrow's AM dose of Lasix.  - Continue coreg 3.125 bid - Continue hydralazine 12.5 tid - No longer on Imdur (was discontinued 10/20 ? 2/2 hypotension) - not a candidate for ARB/ dig or sprio w/ CKD.  - recommend palliative care/ hospice but pt has refused recently   2. VT: h/o recurrent VT as noted above. Admitted 7/20 w/ VT storm. Currently on PO amiodarone 200 bid. Had brief Polymorphic VT around 7:20 AM today but asymptomatic. K was 5.3 this am. Has received IV Lasix. Has functioning ICD.  - continue to monitor on tele - closely monitor electrolytes (K and Mg) and keep WNL  3. Anemia: hgb 7.7. FOBT +. Evaluated by GI. No plans for further w/u given no overt GI bleeding (see explanation above)  - continue supp Fe - transfuse if hgb drops <7.0  4. CKD, Stage IV: baseline SCr ~1.8-2.0. GFR 25 -  continue IV Lasix for relief of congestion/ CHF symptoms. Will increase to 80 mg daily - monitor K closely   5. Leukocytosis: unclear etiology. WBC ct 16.3>>18.5. Afebrile. Denies cough, fever, chills, dysuria.  Had negative UA on 11/26. Lactic acid negative on admit.  - defer w/u to primary team    Length of Stay: 2  Stephanie Jester, PA-C  05/28/2019, 11:09 AM  Advanced Heart Failure Team Pager [phone] (M-F; 7a - 4p)  Please contact [facility] for night-coverage after hours (4p -7a ) and weekends on [email or web address]  Patient seen and examined with the above-signed Advanced Practice Provider and/or Housestaff. I personally reviewed laboratory data, imaging studies and relevant notes. I independently examined the patient and formulated the important aspects of the plan. I have edited the note to reflect any of my changes or salient points. I have personally discussed the plan with the patient and/or family.  Very complicated case. Elderly woman with h/o GERD/PUD, severe CHF due to NICM EF 15%, CKD 3-4 and h/o VT now with multiple admits for ab pain and HF. I saw her a few months ago and felt Palliative only real option. She has been admitted almost monthly since.   Now with significant volume overload and NYHA IIIb symptoms.   Echo with EF 15% and normal RV. On tele mostly a pacing at 60 but also with significant RV pacing.   Says she can go to the store with her daughter and do most of her ADLs  On exam General:  Frail elderly HEENT: normal Neck: supple. JVP to jaw Carotids 2+ bilat; no bruits. No lymphadenopathy or thryomegaly appreciated. Cor: PMI laterally displaced. Mildly irregular rate & rhythm. 3/6 MR Lungs: clear Abdomen: soft, nontender, nondistended. No hepatosplenomegaly. No bruits or masses. Good bowel sounds. Extremities: no cyanosis, clubbing, rash, 2+  edema Neuro: alert & orientedx3, cranial nerves grossly intact. moves all 4 extremities w/o difficulty. Affect  pleasant  She had very advanced HF with severe LV function but preserved RV. The real question here is whether or not she could potentially be a VAD candidate. Her fraility, h/o GI bleeding and renal dysfunction are major hurdles but she reports that she can do many of her ADLs independently and she is very eager to pursue any options that may be available to her.   Plan: 1. Proceed with IV diuresis and TED hose 2. Have STJ interrogate ICD to assess proportion of V-pacing and perhaps increase atrial rate from to bolster cardiac output 3. Get PT assessment to see if she actual  can do ADLs independently 4. If above issues can be resolved we will consider RHC and trial of inotropes to see if functional capacity and renal function improve with hemodynamic support  5. That said at this point I still think Palliative Care may be best option .  We will follow.   Glori Bickers, MD  3:12 PM

## 2019-05-28 NOTE — Progress Notes (Signed)
Nutrition Brief Note  Patient identified on the Malnutrition Screening Tool (MST) Report  Patient with stable weights. Currently consuming 75-80% of meals.  Wt Readings from Last 15 Encounters:  05/28/19 61.6 kg  05/14/19 60.3 kg  04/18/19 56.4 kg  03/21/19 59.8 kg  02/28/19 60.3 kg  02/08/19 60.3 kg  01/27/19 64.9 kg  01/24/19 61.2 kg  01/12/19 61 kg  12/19/18 66.1 kg  09/02/18 59 kg  06/13/18 59.8 kg  02/20/18 63.6 kg  08/24/17 66.2 kg  07/21/17 65.8 kg    Body mass index is 26.5 kg/m. Patient meets criteria for overweight based on current BMI.   Current diet order is heart healthy, patient is consuming approximately 75-80% of meals at this time. Labs and medications reviewed.   No nutrition interventions warranted at this time. If nutrition issues arise, please consult RD.   Clayton Bibles, MS, RD, LDN Inpatient Clinical Dietitian Pager: [phone] After Hours Pager: [phone]

## 2019-05-28 NOTE — Progress Notes (Signed)
PROGRESS NOTE  Stephanie Yates GUR:[id number] DOB: 06/12/1946   PCP: Josetta Huddle, MD  Patient is from: Home  DOA: 05/26/2019 LOS: 2  Brief Narrative / Interim history: 73 year old female with history of systolic CHF/NICM (EF 10 to 15%), VT s/p AICD 2014 and biventricular PPM, paroxysmal A. fib/bradycardia, chronic anemia, CKD-4, HTN, HLD, moderate protein calorie malnutrition and GIB, prolonged QT/torsade and multiple hospitalizations presented to ED 11/38/20 with abdominal pain, nausea, vomiting, shortness of breath and lower extremity edema.   In ED, HDS.  W 16.  Hgb 7.7 (baseline 8-9).  Creatinine 2.0 (about baseline).  BNP 1587 (slightly higher than baseline).  CXR concerning for CHF.  CT abdomen and pelvis with right large inguinal hernia without obstruction or incarceration.  FOBT positive.  She was admitted for CHF exacerbation and GI bleed.  Started on IV diuretics.  Will give GI consulted.  Patient was evaluated by Dr. Ethlyn Daniels from [facility].  GI did not feel EGD or colonoscopy is necessary as she had several of them in the last 2 months.  GI suggested intermittent blood and iron transfusion unless rampant overt bleeding in which case she would need transfer to tertiary center given his significant cardiac history.  Advanced heart failure team consulted 05/28/2019.  Subjective: No major events overnight of this morning.  She denies abdominal pain reports shortness of breath, orthopnea and PND.  She states she had chest pain when she tried to lie in bed last night.  She is currently chest pain-free.  She says edema has improved a little bit.  Denies nausea, vomiting or UTI symptoms.  Did not notice significant urine output with IV Lasix.  Objective: Vitals:   05/28/19 0500 05/28/19 0826 05/28/19 0921 05/28/19 1234  BP: (!) 151/58 132/70 (!) 127/59 114/69  Pulse: 60 (!) 58 63   Resp: 16 (!) 24    Temp:  97.9 F (36.6 C)  97.7 F (36.5 C)  TempSrc:  Oral  Oral  SpO2: 100% 100%     Weight: 61.6 kg     Height:        Intake/Output Summary (Last 24 hours) at 05/28/2019 1543 Last data filed at 05/28/2019 1115 Gross per 24 hour  Intake 945 ml  Output 700 ml  Net 245 ml   Filed Weights   05/26/19 1829 05/27/19 0655 05/28/19 0500  Weight: 60.3 kg 60.8 kg 61.6 kg    Examination:  GENERAL: No acute distress.  Appears well.  HEENT: MMM.  Vision and hearing grossly intact.  NECK: Supple.  No apparent JVD but she is sitting up side on the edge of the bed. RESP:  No IWOB.  Diminished aeration bilaterally.  Bibasilar rales. CVS:  RRR. Heart sounds normal.  ABD/GI/GU: Bowel sounds present. Soft. Non tender.  MSK/EXT:  No apparent deformity.  Moves extremities.  2+ pitting edema to mid shin. SKIN: no apparent skin lesion or wound NEURO: Awake, alert and oriented appropriately.  No gross deficit.  PSYCH: Calm. Normal affect.  Assessment & Plan: Acute on chronic systolic CHF/NICM: NYHA IIIb-IV.  Most recent echo with EF of 10 to 15%.  Per advanced HF team, patient declined palliative care/hospice in the past.  Patient has cardinal symptoms including dyspnea, orthopnea and PND.  No significant urine output improvement with IV Lasix.  -Advanced heart failure team consulted-increase Lasix to 80 mg daily -Limited on options for GDMT due to CKD -On low-dose Coreg and low-dose hydralazine -Monitor fluid status, renal function and electrolytes -Sodium and  fluid restriction  History of recurrent VT s/p AICD -Continue telemetry monitoring -Optimize K and Mg  Paroxysmal A. Fib/bradycardia -Coreg as above -Eliquis on hold due to GI bleed  Acute on chronic blood loss anemia in patient with GI bleed and chronic kidney disease Acute on chronic GI bleed-FOBT positive.  No scope per GI.  Consider tertiary center if significant bleeding.  GI signed off -Hgb 8-9 (baseline)> 7.7 (admit)>> 7.7 -Continue PPI -Check anemia panel -Monitor H&H  CKD-4: Relatively stable -Monitor  while on IV Lasix  Leukocytosis: Suspect demargination.  No signs of infection -Continue monitoring  Abdominal pain: Could be due to CHF.  She also have significant right inguinal hernia which could be contributing. -Manage CHF as above -GI signed off.  Essential hypertension: SBP slightly elevated.  DBP slightly low.  Mild hyperkalemia: -Anticipate improvement with IV Lasix -We will continue monitoring  Moderate protein calorie malnutrition: -Consult dietitian               DVT prophylaxis: SCD Code Status: Partial.  No intubation. Family Communication: Patient and/or RN. Available if any question. Disposition Plan: Remains inpatient Consultants: Cardiology, GI (signed off)  Procedures:  None  Microbiology summarized: [id number] negative  Sch Meds:  Scheduled Meds: . amiodarone  200 mg Oral BID  . atorvastatin  40 mg Oral q1800  . carvedilol  3.125 mg Oral BID WC  . ferrous gluconate  324 mg Oral BID  . [START ON 05/29/2019] furosemide  80 mg Intravenous Daily  . hydrALAZINE  12.5 mg Oral TID  . magnesium oxide  400 mg Oral Daily  . mexiletine  200 mg Oral BID  . pantoprazole  40 mg Oral Daily  . sodium chloride flush  3 mL Intravenous Q12H  . sucralfate  1 g Oral TID WC & HS   Continuous Infusions: . sodium chloride     PRN Meds:.sodium chloride, acetaminophen, albuterol, polyethylene glycol, sodium chloride flush  Antimicrobials: Anti-infectives (From admission, onward)   None       I have personally reviewed the following labs and images: CBC: Recent Labs  Lab 05/23/19 0056 05/26/19 1549 05/27/19 0656 05/28/19 0408  WBC 18.5* 16.0* 16.3* 18.5*  HGB 8.4* 7.7* 8.0* 7.7*  HCT 26.0* 23.2* 24.3* 23.4*  MCV 80.7 79.5* 78.6* 78.0*  PLT 415* 313 342 314   BMP &GFR Recent Labs  Lab 05/23/19 0056 05/26/19 1549 05/28/19 0408 05/28/19 1231  NA 142 141 141  --   K 3.5 3.1* 5.3*  --   CL 105 105 108  --   CO2 25 26 25   --   GLUCOSE 98 96  110*  --   BUN 28* 30* 32*  --   CREATININE 2.16* 2.08* 2.17*  --   CALCIUM 8.9 8.6* 8.3*  --   MG  --   --   --  2.1   Estimated Creatinine Clearance: 18.4 mL/min (A) (by C-G formula based on SCr of 2.17 mg/dL (H)). Liver & Pancreas: Recent Labs  Lab 05/26/19 1549  AST 85*  ALT 39  ALKPHOS 98  BILITOT 1.2  PROT 7.0  ALBUMIN 2.2*   Recent Labs  Lab 05/26/19 1549  LIPASE 28   No results for input(s): AMMONIA in the last 168 hours. Diabetic: No results for input(s): HGBA1C in the last 72 hours. No results for input(s): GLUCAP in the last 168 hours. Cardiac Enzymes: No results for input(s): CKTOTAL, CKMB, CKMBINDEX, TROPONINI in the last 168 hours. No results for  input(s): PROBNP in the last 8760 hours. Coagulation Profile: Recent Labs  Lab 05/23/19 0056  INR 2.2*   Thyroid Function Tests: No results for input(s): TSH, T4TOTAL, FREET4, T3FREE, THYROIDAB in the last 72 hours. Lipid Profile: No results for input(s): CHOL, HDL, LDLCALC, TRIG, CHOLHDL, LDLDIRECT in the last 72 hours. Anemia Panel: Recent Labs    05/27/19 1157  IRON 19*   Urine analysis:    Component Value Date/Time   COLORURINE YELLOW 05/23/2019 0848   APPEARANCEUR HAZY (A) 05/23/2019 0848   LABSPEC 1.013 05/23/2019 0848   PHURINE 6.0 05/23/2019 0848   GLUCOSEU NEGATIVE 05/23/2019 0848   HGBUR LARGE (A) 05/23/2019 0848   BILIRUBINUR NEGATIVE 05/23/2019 0848   KETONESUR NEGATIVE 05/23/2019 0848   PROTEINUR 30 (A) 05/23/2019 0848   UROBILINOGEN 1.0 08/29/2014 2035   NITRITE NEGATIVE 05/23/2019 0848   LEUKOCYTESUR NEGATIVE 05/23/2019 0848   Sepsis Labs: Invalid input(s): PROCALCITONIN, [place]  Microbiology: Recent Results (from the past 240 hour(s))  SARS CORONAVIRUS 2 (TAT 6-24 HRS) Nasopharyngeal Nasopharyngeal Swab     Status: None   Collection Time: 05/26/19 12:32 AM   Specimen: Nasopharyngeal Swab  Result Value Ref Range Status   SARS Coronavirus 2 NEGATIVE NEGATIVE Final     Comment: (NOTE) SARS-CoV-2 target nucleic acids are NOT DETECTED. The SARS-CoV-2 RNA is generally detectable in upper and lower respiratory specimens during the acute phase of infection. Negative results do not preclude SARS-CoV-2 infection, do not rule out co-infections with other pathogens, and should not be used as the sole basis for treatment or other patient management decisions. Negative results must be combined with clinical observations, patient history, and epidemiological information. The expected result is Negative. Fact Sheet for Patients: SugarRoll.be Fact Sheet for Healthcare Providers: https://www.woods-mathews.com/ This test is not yet approved or cleared by the [place] FDA and  has been authorized for detection and/or diagnosis of SARS-CoV-2 by FDA under an Emergency Use Authorization (EUA). This EUA will remain  in effect (meaning this test can be used) for the duration of the COVID-19 declaration under Section 56 4(b)(1) of the Act, 21 U.S.C. section 360bbb-3(b)(1), unless the authorization is terminated or revoked sooner. Performed at [facility], [place] [address] [place], [place] [address]     Radiology Studies: No results found.    Taye T. [place]  If 7PM-7AM, please contact night-coverage [email or web address] Password [facility] 05/28/2019, 3:43 PM

## 2019-05-28 NOTE — Progress Notes (Signed)
Patients Hydralazine was held due to BP being 113/44. Coreg was given.

## 2019-05-29 DIAGNOSIS — I5023 Acute on chronic systolic (congestive) heart failure: Secondary | ICD-10-CM | POA: Diagnosis not present

## 2019-05-29 DIAGNOSIS — L8990 Pressure ulcer of unspecified site, unspecified stage: Secondary | ICD-10-CM | POA: Insufficient documentation

## 2019-05-29 LAB — HEPATIC FUNCTION PANEL
ALT: 38 U/L (ref 0–44)
AST: 85 U/L — ABNORMAL HIGH (ref 15–41)
Albumin: 2.1 g/dL — ABNORMAL LOW (ref 3.5–5.0)
Alkaline Phosphatase: 100 U/L (ref 38–126)
Bilirubin, Direct: 0.5 mg/dL — ABNORMAL HIGH (ref 0.0–0.2)
Indirect Bilirubin: 0.6 mg/dL (ref 0.3–0.9)
Total Bilirubin: 1.1 mg/dL (ref 0.3–1.2)
Total Protein: 7.3 g/dL (ref 6.5–8.1)

## 2019-05-29 LAB — CBC
HCT: 25.5 % — ABNORMAL LOW (ref 36.0–46.0)
HCT: 26.2 % — ABNORMAL LOW (ref 36.0–46.0)
Hemoglobin: 8.4 g/dL — ABNORMAL LOW (ref 12.0–15.0)
Hemoglobin: 8.5 g/dL — ABNORMAL LOW (ref 12.0–15.0)
MCH: 26 pg (ref 26.0–34.0)
MCH: 25.9 pg — ABNORMAL LOW (ref 26.0–34.0)
MCHC: 32.9 g/dL (ref 30.0–36.0)
MCHC: 32.4 g/dL (ref 30.0–36.0)
MCV: 78.9 fL — ABNORMAL LOW (ref 80.0–100.0)
MCV: 79.9 fL — ABNORMAL LOW (ref 80.0–100.0)
Platelets: 342 10*3/uL (ref 150–400)
Platelets: 333 10*3/uL (ref 150–400)
RBC: 3.23 MIL/uL — ABNORMAL LOW (ref 3.87–5.11)
RBC: 3.28 MIL/uL — ABNORMAL LOW (ref 3.87–5.11)
RDW: 15.2 % (ref 11.5–15.5)
RDW: 15.5 % (ref 11.5–15.5)
WBC: 18.6 10*3/uL — ABNORMAL HIGH (ref 4.0–10.5)
WBC: 19 10*3/uL — ABNORMAL HIGH (ref 4.0–10.5)
nRBC: 0 % (ref 0.0–0.2)
nRBC: 0 % (ref 0.0–0.2)

## 2019-05-29 LAB — DIFFERENTIAL
Abs Immature Granulocytes: 0.1 10*3/uL — ABNORMAL HIGH (ref 0.00–0.07)
Basophils Absolute: 0.1 10*3/uL (ref 0.0–0.1)
Basophils Relative: 1 %
Eosinophils Absolute: 0.2 10*3/uL (ref 0.0–0.5)
Eosinophils Relative: 1 %
Immature Granulocytes: 1 %
Lymphocytes Relative: 9 %
Lymphs Abs: 1.7 10*3/uL (ref 0.7–4.0)
Monocytes Absolute: 2 10*3/uL — ABNORMAL HIGH (ref 0.1–1.0)
Monocytes Relative: 11 %
Neutro Abs: 14.9 10*3/uL — ABNORMAL HIGH (ref 1.7–7.7)
Neutrophils Relative %: 77 %

## 2019-05-29 LAB — BASIC METABOLIC PANEL
Anion gap: 12 (ref 5–15)
BUN: 31 mg/dL — ABNORMAL HIGH (ref 8–23)
CO2: 23 mmol/L (ref 22–32)
Calcium: 8.3 mg/dL — ABNORMAL LOW (ref 8.9–10.3)
Chloride: 103 mmol/L (ref 98–111)
Creatinine, Ser: 2.03 mg/dL — ABNORMAL HIGH (ref 0.44–1.00)
GFR calc Af Amer: 27 mL/min — ABNORMAL LOW (ref >60)
GFR calc non Af Amer: 23 mL/min — ABNORMAL LOW (ref >60)
Glucose, Bld: 95 mg/dL (ref 70–99)
Potassium: 4.4 mmol/L (ref 3.5–5.1)
Sodium: 138 mmol/L (ref 135–145)

## 2019-05-29 LAB — MAGNESIUM: Magnesium: 2.2 mg/dL (ref 1.7–2.4)

## 2019-05-29 MED ORDER — ASPIRIN 81 MG PO CHEW
81.0000 mg | CHEWABLE_TABLET | ORAL | Status: AC
  Administered 2019-05-30: 81 mg via ORAL
  Filled 2019-05-29: qty 1

## 2019-05-29 MED ORDER — PROMETHAZINE HCL 25 MG/ML IJ SOLN
12.5000 mg | Freq: Three times a day (TID) | INTRAMUSCULAR | Status: DC | PRN
  Administered 2019-05-29 – 2019-06-06 (×3): 12.5 mg via INTRAVENOUS
  Filled 2019-05-29 (×3): qty 1

## 2019-05-29 MED ORDER — ENSURE ENLIVE PO LIQD
237.0000 mL | ORAL | Status: DC
  Administered 2019-05-29 – 2019-06-05 (×6): 237 mL via ORAL

## 2019-05-29 MED ORDER — POLYETHYLENE GLYCOL 3350 17 G PO PACK
17.0000 g | PACK | Freq: Every day | ORAL | Status: DC
  Administered 2019-05-29 – 2019-05-31 (×3): 17 g via ORAL
  Filled 2019-05-29 (×4): qty 1

## 2019-05-29 MED ORDER — SENNOSIDES-DOCUSATE SODIUM 8.6-50 MG PO TABS: 1.0000 | ORAL_TABLET | Freq: Two times a day (BID) | ORAL | Status: DC | PRN

## 2019-05-29 MED ORDER — FUROSEMIDE 10 MG/ML IJ SOLN
80.0000 mg | Freq: Two times a day (BID) | INTRAMUSCULAR | Status: DC
  Administered 2019-05-29 – 2019-05-31 (×4): 80 mg via INTRAVENOUS
  Filled 2019-05-29 (×5): qty 8

## 2019-05-29 MED ORDER — SODIUM CHLORIDE 0.9% FLUSH
3.0000 mL | Freq: Two times a day (BID) | INTRAVENOUS | Status: DC
  Administered 2019-05-29 – 2019-06-06 (×9): 3 mL via INTRAVENOUS

## 2019-05-29 NOTE — Evaluation (Signed)
Physical Therapy Evaluation Patient Details Name: Stephanie Yates MRN: [id number] DOB: [birth year] Today's Date: 05/29/2019   History of Present Illness  pt is a 73 yo female presented to ED with c/o abdominal pain, B LE swelling. Several recent admissions for B LE swelling, SOB, chronic large R inguinal hernia, PMH: CHF, HTN, afib, HLD, CKD  Clinical Impression  Pt is a 73 yo female admitted for above. Pt in bed upon arrival and agreeable to PT. Pt reports living alone with her daughter who checks on her intermittently. Pt reports being independent with ADLs. Of note, per chart review pt has had several recent admissions for similar complaints. Pt reporting pain at 8/10 on right side. Pt presents with decreased strength, power, balance and activity tolerance limiting functional mobility. Pt required min g assist throughout for safety. VSS stable throughout with pt reporting mild SOB during ambulation with O2 sats 97% on RA. Pt very slow during ambulation stating normally she ambulates with much faster gait speed. Pt performed seated LE therex with min cuing for correct performance. Pt would benefit from skilled acute PT to improve noted deficits and home health PT following acute hospitalization to further improve deficits, increase independence, decrease fall risk and allow for return to PLOF.     Follow Up Recommendations Home health PT    Equipment Recommendations  None recommended by PT;Other (comment)(pt has RW)    Recommendations for Other Services       Precautions / Restrictions Precautions Precautions: Fall Restrictions Weight Bearing Restrictions: No      Mobility  Bed Mobility Overal bed mobility: Modified Independent Bed Mobility: Supine to Sit     Supine to sit: Modified independent (Device/Increase time);HOB elevated     General bed mobility comments: increased time to get EOB, no physical A or cuing required  Transfers Overall transfer level: Needs  assistance Equipment used: Rolling walker (2 wheeled) Transfers: Sit to/from Stand Sit to Stand: Min guard         General transfer comment: min g for safety, cuing for hand placement, increased time to power to full standing, pt stands with increased trunk flexion no unsteadiness noted  Ambulation/Gait Ambulation/Gait assistance: Min guard Gait Distance (Feet): 35 Feet Assistive device: Rolling walker (2 wheeled) Gait Pattern/deviations: Step-through pattern;Decreased stride length;Trunk flexed Gait velocity: decreased   General Gait Details: pt ambulated with slow gait speed and cadence and short stride length, increased reliance on UE support from RW, increased trunk flexion, slow but steady with no LOB noted  Stairs            Wheelchair Mobility    Modified Rankin (Stroke Patients Only)       Balance Overall balance assessment: Needs assistance Sitting-balance support: Single extremity supported;Feet supported Sitting balance-Leahy Scale: Good Sitting balance - Comments: steady sitting EOB and performing LE therex in recliner   Standing balance support: Bilateral upper extremity supported;During functional activity   Standing balance comment: min G and UE support during ambulation                             Pertinent Vitals/Pain Faces Pain Scale: Hurts whole lot Pain Location: R side Pain Descriptors / Indicators: Sore;Aching Pain Intervention(s): Monitored during session;Repositioned;Limited activity within patient's tolerance    Home Living Family/patient expects to be discharged to:: Private residence Living Arrangements: Alone Available Help at Discharge: Family;Available PRN/intermittently Type of Home: House Home Access: Stairs to enter Entrance Stairs-Rails: Can  reach both Entrance Stairs-Number of Steps: 3 Home Layout: One level Home Equipment: Walker - 2 wheels      Prior Function Level of Independence: Independent          Comments: pt reports being independent with all ADLs including cooking and housework, denies any recent falls     Hand Dominance        Extremity/Trunk Assessment   Upper Extremity Assessment Upper Extremity Assessment: Generalized weakness    Lower Extremity Assessment Lower Extremity Assessment: Generalized weakness(hip flexors 3/5, quads 3+/5, hamstrings 3/5)    Cervical / Trunk Assessment Cervical / Trunk Assessment: Kyphotic  Communication   Communication: No difficulties  Cognition Arousal/Alertness: Awake/alert Behavior During Therapy: Flat affect Overall Cognitive Status: Within Functional Limits for tasks assessed                                        General Comments      Exercises Total Joint Exercises Long Arc Quad: AROM;Both;10 reps Marching in Standing: AROM;Both;10 reps;Seated General Exercises - Lower Extremity Heel Raises: AROM;Both;10 reps;Seated   Assessment/Plan    PT Assessment Patient needs continued PT services  PT Problem List Decreased strength;Decreased mobility;Decreased activity tolerance;Decreased balance;Pain;Decreased safety awareness;Decreased knowledge of use of DME       PT Treatment Interventions DME instruction;Gait training;Stair training;Functional mobility training;Therapeutic activities;Therapeutic exercise;Balance training;Patient/family education    PT Goals (Current goals can be found in the Care Plan section)  Acute Rehab PT Goals Patient Stated Goal: go home PT Goal Formulation: With patient Time For Goal Achievement: 06/12/19 Potential to Achieve Goals: Good    Frequency Min 3X/week   Barriers to discharge Decreased caregiver support      Co-evaluation               AM-PAC PT "6 Clicks" Mobility  Outcome Measure Help needed turning from your back to your side while in a flat bed without using bedrails?: None Help needed moving from lying on your back to sitting on the side of a  flat bed without using bedrails?: A Little Help needed moving to and from a bed to a chair (including a wheelchair)?: A Little Help needed standing up from a chair using your arms (e.g., wheelchair or bedside chair)?: A Little Help needed to walk in hospital room?: A Little Help needed climbing 3-5 steps with a railing? : A Lot 6 Click Score: 18    End of Session Equipment Utilized During Treatment: Gait belt Activity Tolerance: Patient tolerated treatment well Patient left: in chair;with call bell/phone within reach;with chair alarm set Nurse Communication: Mobility status PT Visit Diagnosis: Muscle weakness (generalized) (M62.81);Difficulty in walking, not elsewhere classified (R26.2)    Time: 3614-4315 PT Time Calculation (min) (ACUTE ONLY): 31 min   Charges:   PT Evaluation $PT Eval Moderate Complexity: 1 Mod PT Treatments $Therapeutic Exercise: 8-22 mins        Sakib Noguez PT, DPT 10:35 AM,05/29/19   Naima Veldhuizen Drucilla Chalet 05/29/2019, 10:30 AM

## 2019-05-29 NOTE — TOC Progression Note (Addendum)
Transition of Care [facility]) - Progression Note    Patient Details  Name: Stephanie Yates MRN: [id number] Date of Birth: Oct 19, 1945  [facility] CM/SW Contact  Graves-Bigelow, Ocie Cornfield, RN Phone Number: 05/29/2019, 2:52 PM  Clinical Narrative: Patient continues on IV Lasix. Plan for Right Heart Cath (RHC) on 05-30-19. Pt  Will need resumption orders for [facility] RN/PT services and F2F once stable to transition home. CM will continue to follow for additional transition of care needs.      Expected Discharge Plan: [place] Barriers to Discharge: Continued Medical Work up  Expected Discharge Plan and Services Expected Discharge Plan: [place] In-house Referral: NA Discharge Planning Services: CM Consult Post Acute Care Choice: Home Health, Resumption of Svcs/PTA Provider Living arrangements for the past 2 months: Single Family Home                 HH Arranged: OT HH Agency: [facility] Date [place]: 05/27/19 Time [place]: [place] Representative spoke with at [place] ([place]) Interventions    Readmission Risk Interventions Readmission Risk Prevention Plan 05/27/2019 05/10/2019 01/24/2019  Transportation Screening Complete Complete Complete  PCP or Specialist Appt within 3-5 Days - - Complete  HRI or [place] - - Complete  Social Work Consult for [place] Planning/Counseling - - Complete  Palliative Care Screening - - Not Applicable  Medication Review Press photographer) Complete Complete Complete  Med Review Comments - - -  PCP or Specialist appointment within 3-5 days of discharge Complete Complete -  [place] or Home Care Consult Complete Complete -  SW Recovery Care/Counseling Consult Complete Complete -  Palliative Care Screening Not Applicable Not Applicable -  [place] Not Applicable Not Applicable -  Some recent data might be hidden

## 2019-05-29 NOTE — Progress Notes (Signed)
PROGRESS NOTE  Stephanie Yates ATF:[id number] DOB: 07/09/1945   PCP: Josetta Huddle, MD  Patient is from: Home  DOA: 05/26/2019 LOS: 3  Brief Narrative / Interim history: 73 year old female with history of systolic CHF/NICM (EF 10 to 15%), VT s/p AICD 2014 and biventricular PPM, paroxysmal A. fib/bradycardia, chronic anemia, CKD-4, HTN, HLD, moderate protein calorie malnutrition and GIB, prolonged QT/torsade and multiple hospitalizations presented to ED 11/38/20 with abdominal pain, nausea, vomiting, shortness of breath and lower extremity edema.   In ED, HDS.  W 16.  Hgb 7.7 (baseline 8-9).  Creatinine 2.0 (about baseline).  BNP 1587 (slightly higher than baseline).  CXR concerning for CHF.  CT abdomen and pelvis with right large inguinal hernia without obstruction or incarceration.  FOBT positive.  She was admitted for CHF exacerbation and GI bleed.  Started on IV diuretics.  Will give GI consulted.  Patient was evaluated by Dr. Ethlyn Daniels from [facility]. GI did not feel EGD or colonoscopy is necessary as she had several of them in the last 2 months.  GI suggested intermittent blood and iron transfusion unless rampant overt bleeding in which case she would need transfer to tertiary center given his significant cardiac history.  Advanced heart failure team consulted 05/28/2019.  Pacemaker interrogated and adjusted.  Diuretics increased.  Subjective: No major events overnight of this morning.  No complaints.  Reports improvement in her breathing and swelling.  Denies chest pain, GI or UTI symptoms.  She had about 1.3 L UOP in the last 24 hours.  Renal function improved.  Objective: Vitals:   05/29/19 0500 05/29/19 0508 05/29/19 0823 05/29/19 0832  BP:    (!) 123/53  Pulse:  72  66  Resp:    17  Temp:  97.6 F (36.4 C) (!) 97.4 F (36.3 C)   TempSrc:  Oral Oral   SpO2:  96%  98%  Weight: 61.3 kg     Height:        Intake/Output Summary (Last 24 hours) at 05/29/2019 1110 Last data filed  at 05/29/2019 0823 Gross per 24 hour  Intake 840 ml  Output 1300 ml  Net -460 ml   Filed Weights   05/27/19 0655 05/28/19 0500 05/29/19 0500  Weight: 60.8 kg 61.6 kg 61.3 kg    Examination:  GENERAL: No acute distress.  Appears well.  HEENT: MMM.  Vision and hearing grossly intact.  NECK: Supple.  Noted JVD but lying down. RESP:  No IWOB. Good air movement bilaterally. CVS:  RRR. Heart sounds normal.  ABD/GI/GU: Bowel sounds present. Soft.  Diffuse tenderness. MSK/EXT:  Moves extremities. No apparent deformity.  1+ pitting edema bilaterally through TED hose. SKIN: no apparent skin lesion or wound NEURO: Awake, alert and oriented appropriately.  No apparent focal neuro deficit. PSYCH: Calm. Normal affect.  Assessment & Plan: Acute on chronic systolic CHF/NICM: NYHA IIIb-IV.  Most recent echo with EF of 10 to 15%.  Per advanced HF team, patient declined palliative care/hospice in the past.  Patient has cardinal symptoms including dyspnea, orthopnea and PND.  About 1.3 L UOP/24 hours.  Renal function improved. -Advanced heart failure team consulted-increased Lasix to 80 mg twice daily -Plan for RHC 12/3 -Limited on options for GDMT due to CKD -Continue Coreg and hydralazine -Monitor fluid status, renal function and electrolytes -Sodium and fluid restriction  History of recurrent VT s/p AICD: Device interrogated and no recurrent VT. -On amiodarone 200 mg twice daily -Optimize K and Mg  Paroxysmal A. Fib/bradycardia -  Coreg and amiodarone as above -Eliquis on hold due to GI bleed  Acute on chronic blood loss anemia in patient with GI bleed and chronic kidney disease Acute on chronic GI bleed-FOBT positive.  No scope per GI.  Consider tertiary center if significant bleeding.  GI signed off.  Anemia panel normal. -Hgb 8-9 (baseline)> 7.7 (admit)>> 7.7> 8.4 -Continue PPI -Monitor H&H  Persistent leukocytosis: Demargination?  Afebrile.  No finding on UA, CXR and CT abdomen and  pelvis to suggest infectious process.  Not on steroids. -Check differentials. -Continue trending -Consider repeat imaging if no improvement.  Abdominal pain/tenderness: Could be due to CHF.  She also have significant right inguinal hernia which could be contributing.  No incarceration on CT. -Manage CHF as above -Continue PPI -GI signed off.  Elevated liver enzymes/possible liver cirrhosis: ST elevation likely due to CHF.  CT a/p suggestive for cirrhosis -Recheck LFT -Manage CHF as above  CKD-4: Relatively stable -Monitor while on IV Lasix  Essential hypertension: Normotensive -Cardiac meds as above  Mild hyperkalemia: Resolved -We will continue monitoring  Moderate protein calorie malnutrition: -Consult dietitian  Nonobstructing nephrolithiasis -Continue monitoring     Nutrition Problem: Increased nutrient needs Etiology: chronic illness  Signs/Symptoms: estimated needs  Interventions: Education   DVT prophylaxis: SCD Code Status: Partial.  No intubation. Family Communication: Updated patient's daughter, Ms. Walker over the phone. Disposition Plan: Remains inpatient Consultants: Cardiology, GI (signed off)  Procedures:  None  Microbiology summarized: [id number] negative  Sch Meds:  Scheduled Meds: . amiodarone  200 mg Oral BID  . atorvastatin  40 mg Oral q1800  . carvedilol  3.125 mg Oral BID WC  . ferrous gluconate  324 mg Oral BID  . furosemide  80 mg Intravenous BID  . hydrALAZINE  12.5 mg Oral TID  . magnesium oxide  400 mg Oral Daily  . mexiletine  200 mg Oral BID  . pantoprazole  40 mg Oral Daily  . sodium chloride flush  3 mL Intravenous Q12H  . sodium chloride flush  3 mL Intravenous Q12H  . sucralfate  1 g Oral TID WC & HS   Continuous Infusions: . sodium chloride     PRN Meds:.sodium chloride, acetaminophen, albuterol, polyethylene glycol, sodium chloride flush  Antimicrobials: Anti-infectives (From admission, onward)   None        I have personally reviewed the following labs and images: CBC: Recent Labs  Lab 05/23/19 0056 05/26/19 1549 05/27/19 0656 05/28/19 0408 05/29/19 0352  WBC 18.5* 16.0* 16.3* 18.5* 18.6*  HGB 8.4* 7.7* 8.0* 7.7* 8.4*  HCT 26.0* 23.2* 24.3* 23.4* 25.5*  MCV 80.7 79.5* 78.6* 78.0* 78.9*  PLT 415* 313 342 314 342   BMP &GFR Recent Labs  Lab 05/23/19 0056 05/26/19 1549 05/28/19 0408 05/28/19 1231 05/29/19 0352  NA 142 141 141  --  138  K 3.5 3.1* 5.3*  --  4.4  CL 105 105 108  --  103  CO2 25 26 25   --  23  GLUCOSE 98 96 110*  --  95  BUN 28* 30* 32*  --  31*  CREATININE 2.16* 2.08* 2.17*  --  2.03*  CALCIUM 8.9 8.6* 8.3*  --  8.3*  MG  --   --   --  2.1 2.2   Estimated Creatinine Clearance: 19.6 mL/min (A) (by C-G formula based on SCr of 2.03 mg/dL (H)). Liver & Pancreas: Recent Labs  Lab 05/26/19 1549  AST 85*  ALT 39  ALKPHOS 98  BILITOT 1.2  PROT 7.0  ALBUMIN 2.2*   Recent Labs  Lab 05/26/19 1549  LIPASE 28   No results for input(s): AMMONIA in the last 168 hours. Diabetic: No results for input(s): HGBA1C in the last 72 hours. No results for input(s): GLUCAP in the last 168 hours. Cardiac Enzymes: No results for input(s): CKTOTAL, CKMB, CKMBINDEX, TROPONINI in the last 168 hours. No results for input(s): PROBNP in the last 8760 hours. Coagulation Profile: Recent Labs  Lab 05/23/19 0056  INR 2.2*   Thyroid Function Tests: No results for input(s): TSH, T4TOTAL, FREET4, T3FREE, THYROIDAB in the last 72 hours. Lipid Profile: No results for input(s): CHOL, HDL, LDLCALC, TRIG, CHOLHDL, LDLDIRECT in the last 72 hours. Anemia Panel: Recent Labs    05/27/19 1157 05/28/19 1644  VITAMINB12  --  755  FOLATE  --  14.6  FERRITIN  --  19  TIBC  --  311  IRON 19* 36  RETICCTPCT  --  2.4   Urine analysis:    Component Value Date/Time   COLORURINE YELLOW 05/23/2019 0848   APPEARANCEUR HAZY (A) 05/23/2019 0848   LABSPEC 1.013 05/23/2019 0848   PHURINE  6.0 05/23/2019 0848   GLUCOSEU NEGATIVE 05/23/2019 0848   HGBUR LARGE (A) 05/23/2019 0848   BILIRUBINUR NEGATIVE 05/23/2019 0848   KETONESUR NEGATIVE 05/23/2019 0848   PROTEINUR 30 (A) 05/23/2019 0848   UROBILINOGEN 1.0 08/29/2014 2035   NITRITE NEGATIVE 05/23/2019 0848   LEUKOCYTESUR NEGATIVE 05/23/2019 0848   Sepsis Labs: Invalid input(s): PROCALCITONIN, [place]  Microbiology: Recent Results (from the past 240 hour(s))  SARS CORONAVIRUS 2 (TAT 6-24 HRS) Nasopharyngeal Nasopharyngeal Swab     Status: None   Collection Time: 05/26/19 12:32 AM   Specimen: Nasopharyngeal Swab  Result Value Ref Range Status   SARS Coronavirus 2 NEGATIVE NEGATIVE Final    Comment: (NOTE) SARS-CoV-2 target nucleic acids are NOT DETECTED. The SARS-CoV-2 RNA is generally detectable in upper and lower respiratory specimens during the acute phase of infection. Negative results do not preclude SARS-CoV-2 infection, do not rule out co-infections with other pathogens, and should not be used as the sole basis for treatment or other patient management decisions. Negative results must be combined with clinical observations, patient history, and epidemiological information. The expected result is Negative. Fact Sheet for Patients: SugarRoll.be Fact Sheet for Healthcare Providers: https://www.woods-mathews.com/ This test is not yet approved or cleared by the [place] FDA and  has been authorized for detection and/or diagnosis of SARS-CoV-2 by FDA under an Emergency Use Authorization (EUA). This EUA will remain  in effect (meaning this test can be used) for the duration of the COVID-19 declaration under Section 56 4(b)(1) of the Act, 21 U.S.C. section 360bbb-3(b)(1), unless the authorization is terminated or revoked sooner. Performed at [facility], [place] [address] [place], [place] [address]     Radiology Studies: No results found.  Taye [place]  If 7PM-7AM, please contact night-coverage [email or web address] Password [email or web address] 05/29/2019, 11:10 AM

## 2019-05-29 NOTE — Progress Notes (Addendum)
Advanced Heart Failure Rounding Note  PCP-Cardiologist: Dr. Haroldine Laws  EP: Dr. Caryl Comes   Subjective:    IV Lasix increased to 80 mg yesterday. -1.3 L out. SCr slightly improved, down from 2.17>>2.03. K 4.4. Wt unchanged at 135 lb. Feels about the same. No supp O2 requirements. O2 sats 96% on RA. Complains of post prandial nausea/ RUQ pain.   Device interrogated yesterday. Found to have intermittent loss of atrial capture. Settings adjusted. No significant recurrent VT. V paced 3%. CorVue impedence down c/w volume overload.    Objective:   Weight Range: 61.3 kg Body mass index is 26.4 kg/m.   Vital Signs:   Temp:  [97.4 F (36.3 C)-98 F (36.7 C)] 97.4 F (36.3 C) (12/02 0823) Pulse Rate:  [59-72] 72 (12/02 0508) Resp:  [17-18] 17 (12/02 0431) BP: (105-133)/(44-72) 133/54 (12/02 0431) SpO2:  [94 %-100 %] 96 % (12/02 0508) Weight:  [61.3 kg] 61.3 kg (12/02 0500) Last BM Date: 05/26/19  Weight change: Filed Weights   05/27/19 0655 05/28/19 0500 05/29/19 0500  Weight: 60.8 kg 61.6 kg 61.3 kg    Intake/Output:   Intake/Output Summary (Last 24 hours) at 05/29/2019 0829 Last data filed at 05/29/2019 5102 Gross per 24 hour  Intake 1080 ml  Output 1300 ml  Net -220 ml      Physical Exam    General:  Frail, elderly  No resp difficulty HEENT: Normal Neck: Supple. Elevated JVP . Carotids 2+ bilat; no bruits. No lymphadenopathy or thyromegaly appreciated. Cor: PMI nondisplaced. Regular rate & rhythm. No rubs, gallops or murmurs. Lungs: Clear Abdomen: Soft, nonte nder, nondistended. No hepatosplenomegaly. No bruits or masses. Good bowel sounds. Extremities: No cyanosis, clubbing, rash, trace bilateral LE edema Neuro: Alert & orientedx3, cranial nerves grossly intact. moves all 4 extremities w/o difficulty. Affect pleasant  Telemetry   Paced rhythm 68 bpm   EKG    No new EKG to review   Labs    CBC Recent Labs    05/28/19 0408 05/29/19 0352  WBC 18.5* 18.6*   HGB 7.7* 8.4*  HCT 23.4* 25.5*  MCV 78.0* 78.9*  PLT 314 585   Basic Metabolic Panel Recent Labs    05/28/19 0408 05/28/19 1231 05/29/19 0352  NA 141  --  138  K 5.3*  --  4.4  CL 108  --  103  CO2 25  --  23  GLUCOSE 110*  --  95  BUN 32*  --  31*  CREATININE 2.17*  --  2.03*  CALCIUM 8.3*  --  8.3*  MG  --  2.1 2.2   Liver Function Tests Recent Labs    05/26/19 1549  AST 85*  ALT 39  ALKPHOS 98  BILITOT 1.2  PROT 7.0  ALBUMIN 2.2*   Recent Labs    05/26/19 1549  LIPASE 28   Cardiac Enzymes No results for input(s): CKTOTAL, CKMB, CKMBINDEX, TROPONINI in the last 72 hours.  BNP: BNP (last 3 results) Recent Labs    05/05/19 1849 05/23/19 0057 05/26/19 1822  BNP 1,012.9* 1,286.3* 1,587.2*    ProBNP (last 3 results) No results for input(s): PROBNP in the last 8760 hours.   D-Dimer No results for input(s): DDIMER in the last 72 hours. Hemoglobin A1C No results for input(s): HGBA1C in the last 72 hours. Fasting Lipid Panel No results for input(s): CHOL, HDL, LDLCALC, TRIG, CHOLHDL, LDLDIRECT in the last 72 hours. Thyroid Function Tests No results for input(s): TSH, T4TOTAL, T3FREE, THYROIDAB  in the last 72 hours.  Invalid input(s): FREET3  Other results:   Imaging     No results found.   Medications:     Scheduled Medications: . amiodarone  200 mg Oral BID  . atorvastatin  40 mg Oral q1800  . carvedilol  3.125 mg Oral BID WC  . ferrous gluconate  324 mg Oral BID  . furosemide  80 mg Intravenous Daily  . hydrALAZINE  12.5 mg Oral TID  . magnesium oxide  400 mg Oral Daily  . mexiletine  200 mg Oral BID  . pantoprazole  40 mg Oral Daily  . sodium chloride flush  3 mL Intravenous Q12H  . sucralfate  1 g Oral TID WC & HS     Infusions: . sodium chloride       PRN Medications:  sodium chloride, acetaminophen, albuterol, polyethylene glycol, sodium chloride flush    Patient Profile   Stephanie Solomon McCollumis a 73  y.o.femalewith a hx of chronic combined CHF/NICM EF 15% with paroxysmal sustained ventricular tachycardia s/p SJM AICD 2014, PAF, bradycardia requiring adjustment in lower device rate, mitral regurgitation, HTN, HLD, CKD stage IV (baseline 1.7-2.1), chronic anemia, moderate protein-calorie malnutrition, h/o GIBs (ulcers, gastritis, gastric polyp), prior tobacco use and prior remote ETOH abuse(quit 20 y/a), multiple hospital admissions over the last 6 months and failure to thrive admitted w/ acute on chronic systolic CHF.  Assessment/Plan   1. Acute on Chronic Systolic CHF: NICM. Most recent echo 7/20 showed further reduction in EF, down from previous baseline of 20-25% to 15%. GDMT limited by CKD. Not felt to be a candidate for advanced therapies (I.e VAD) given debility and renal failure. Also with h/o recurrent VT, not a candidate for home inotrope's.   - NYHA Class IIIb-IV symptoms. Physical exam, BNP and CXR c/w volume overload. Device also interrogated yesterday and CorVue impendence c/w volume overload. Complains of post prandial RUQ pain, likely hepatic congestion. AST mildly elevated on admit at 85. Needs additional diuresis. SCr improving.  - Continue IV lasix. Will increase to 80 mg bid today.  - Plan RHC tomorrow w/ central line placement  - Continue coreg 3.125 bid - Continue hydralazine 12.5 tid - No longer on Imdur (was discontinued 10/20 ? 2/2 hypotension) - not a candidate for ARB/ dig or sprio w/ CKD.  - recommend palliative care/ hospice but pt has refused recently   2. VT: h/o recurrent VT as noted above. Admitted 7/20 w/ VT storm. Currently on PO amiodarone 200 bid. Device interrogated yesterday. No recurrent VT.  - continue to monitor on tele - closely monitor electrolytes (K and Mg) and keep WNL  3. Anemia: hgb 7.7 on admit. FOBT +. Evaluated by GI. No plans for further w/u given no overt GI bleeding (see explanation above) - hgb stable at 8.0   - continue supp Fe -  transfuse if hgb drops <7.0  4. CKD, Stage IV: baseline SCr ~1.8-2.0. GFR 25 - slightly improved w/ diuresis, down from 2.1>>2.0 - continue IV Lasix for relief of congestion/ CHF symptoms. Will increase to 80 mg bid - monitor K closely   5. Leukocytosis: unclear etiology. WBC ct 16.3>>18.5>>18.6. Afebrile. Denies cough, fever, chills, dysuria.  Had negative UA on 11/26. Lactic acid negative on admit.  - defer w/u to primary team   Length of Stay: [address] Ladoris Gene  05/29/2019, 8:29 AM  Advanced Heart Failure Team Pager [phone] (M-F; 7a - 4p)  Please contact [facility] for night-coverage  after hours (4p -7a ) and weekends on [email or web address]  Patient seen and examined with the above-signed Advanced Practice Provider and/or Housestaff. I personally reviewed laboratory data, imaging studies and relevant notes. I independently examined the patient and formulated the important aspects of the plan. I have edited the note to reflect any of my changes or salient points. I have personally discussed the plan with the patient and/or family.  Remains quite weak. C/o RUQ pain. Modest diuresis with IV lasix.  Creatinine stable.   On exam General:  Elderly. weak appearing. No resp difficulty HEENT: normal Neck: supple. JVP to jaw Carotids 2+ bilat; no bruits. No lymphadenopathy or thryomegaly appreciated. Cor: PMI nondisplaced. Regular rate & rhythm. 2/6 MR Lungs: clear Abdomen: soft, tender RUQ  nondistended. No hepatosplenomegaly. No bruits or masses. Good bowel sounds. Extremities: no cyanosis, clubbing, rash, 1-2+ edema Neuro: alert & orientedx3, cranial nerves grossly intact. moves all 4 extremities w/o difficulty. Affect pleasant  She is quite tenuous. My suspicion is that she is too weak for advanced therapies (CKD and anemia also hurdles). Await PT eval and will plan RHC tomorrow to reassess need for inotrope trial.   ICD interrogated personally. Only about 3% RV pacing. Volume  up. No recurrent VT.   Suspect some of GI symptoms could be due to advanced HF but she is tender focally over GB. GI has seen.   Glori Bickers, MD  9:10 AM

## 2019-05-29 NOTE — Progress Notes (Signed)
Initial Nutrition Assessment  INTERVENTION:   -Ensure Enlive po daily, each supplement provides 350 kcal and 20 grams of protein -Placed diet education for CHF in discharge instructions  NUTRITION DIAGNOSIS:   Increased nutrient needs related to chronic illness as evidenced by estimated needs.  GOAL:   Patient will meet greater than or equal to 90% of their needs  MONITOR:   PO intake, Labs, Weight trends, I & O's  REASON FOR ASSESSMENT:   Consult Assessment of nutrition requirement/status  ASSESSMENT:   73 year old female with history of systolic CHF/NICM (EF 10 to 15%), VT s/p AICD 2014 and biventricular PPM, paroxysmal A. fib/bradycardia, chronic anemia, CKD-4, HTN, HLD, moderate protein calorie malnutrition and GIB, prolonged QT/torsade and multiple hospitalizations presented to ED 11/38/20 with abdominal pain, nausea, vomiting, shortness of breath and lower extremity edema.  **RD working remotely**  Unable to reach pt by phone today. Per nursing notes, pt had to be disimpacted today to allow for a BM. Pt with some nausea.  Pt consumed 100% of breakfast this morning (providing ~390 kcals and 10g protein). On 12/1, pt consumed 50-75% of meals (providing ~975 kcals and 44g protein daily). Will order an Ensure daily for additional kcals and protein.  Per weight records, pt's weight has remained stable since June 2020.  No edema noted in nursing documentation. Pt receiving IV Lasix. No change in weight so far.   I/Os: -8ml since admit UOP: 1300 ml x 24 hrs  Labs reviewed. Medication: Fergon tablet, IV Lasix, MAG-OX tablet,  Carafate  NUTRITION - FOCUSED PHYSICAL EXAM:  Working remotely.  Diet Order:   Diet Order            Diet Heart Room service appropriate? Yes with Assist; Fluid consistency: Thin  Diet effective now              EDUCATION NEEDS:   Education needs have been addressed  Skin:  Skin Assessment: Reviewed RN Assessment  Last BM:   11/29  Height:   Ht Readings from Last 1 Encounters:  05/27/19 5' (1.524 m)    Weight:   Wt Readings from Last 1 Encounters:  05/29/19 61.3 kg    Ideal Body Weight:  45.4 kg  BMI:  Body mass index is 26.4 kg/m.  Estimated Nutritional Needs:   Kcal:  1500-1700  Protein:  60-70g  Fluid:  1.5L/day  Clayton Bibles, MS, RD, LDN Inpatient Clinical Dietitian Pager: [phone] After Hours Pager: [phone]

## 2019-05-29 NOTE — Discharge Instructions (Signed)

## 2019-05-29 NOTE — Progress Notes (Addendum)
MD notified of RN having to disimpact pt. Stool was kind of soft but large & hard for pt to push out. Once RN disimpacted her a little she was able to push out the remaining stool. Pt has prn miralax but was wondering if she could get something daily especially since she's taking iron supplements. Will continue to monitor the pt. . New orders for scheduled miralax & senna prn bid.  Hoover Brunette, RN   Pt c/o of nause around 1145.  MD notified & asked for something prn & if carafate needs to be held. Will continue to monitor the pt.Hoover Brunette, RN

## 2019-05-30 ENCOUNTER — Encounter (HOSPITAL_COMMUNITY)
Admission: EM | Disposition: A | Discharge status: Skilled Nursing Facility | Payer: Self-pay | Source: Home / Self Care | Attending: Family Medicine

## 2019-05-30 ENCOUNTER — Inpatient Hospital Stay (HOSPITAL_COMMUNITY): Payer: Medicare Other

## 2019-05-30 ENCOUNTER — Encounter (HOSPITAL_COMMUNITY): Payer: Self-pay | Admitting: Internal Medicine

## 2019-05-30 DIAGNOSIS — I5023 Acute on chronic systolic (congestive) heart failure: Secondary | ICD-10-CM | POA: Diagnosis not present

## 2019-05-30 DIAGNOSIS — K5900 Constipation, unspecified: Secondary | ICD-10-CM

## 2019-05-30 DIAGNOSIS — R748 Abnormal levels of other serum enzymes: Secondary | ICD-10-CM

## 2019-05-30 DIAGNOSIS — N184 Chronic kidney disease, stage 4 (severe): Secondary | ICD-10-CM

## 2019-05-30 DIAGNOSIS — D649 Anemia, unspecified: Secondary | ICD-10-CM

## 2019-05-30 DIAGNOSIS — K922 Gastrointestinal hemorrhage, unspecified: Secondary | ICD-10-CM | POA: Diagnosis not present

## 2019-05-30 DIAGNOSIS — R42 Dizziness and giddiness: Secondary | ICD-10-CM

## 2019-05-30 DIAGNOSIS — R10817 Generalized abdominal tenderness: Secondary | ICD-10-CM

## 2019-05-30 DIAGNOSIS — I480 Paroxysmal atrial fibrillation: Secondary | ICD-10-CM | POA: Diagnosis not present

## 2019-05-30 DIAGNOSIS — D62 Acute posthemorrhagic anemia: Secondary | ICD-10-CM

## 2019-05-30 DIAGNOSIS — D72829 Elevated white blood cell count, unspecified: Secondary | ICD-10-CM

## 2019-05-30 DIAGNOSIS — K7469 Other cirrhosis of liver: Secondary | ICD-10-CM

## 2019-05-30 DIAGNOSIS — R9431 Abnormal electrocardiogram [ECG] [EKG]: Secondary | ICD-10-CM

## 2019-05-30 DIAGNOSIS — E440 Moderate protein-calorie malnutrition: Secondary | ICD-10-CM

## 2019-05-30 HISTORY — PX: RIGHT HEART CATH: CATH118263

## 2019-05-30 LAB — POCT I-STAT EG7
Acid-Base Excess: 3 mmol/L — ABNORMAL HIGH (ref 0.0–2.0)
Acid-Base Excess: 3 mmol/L — ABNORMAL HIGH (ref 0.0–2.0)
Acid-Base Excess: 3 mmol/L — ABNORMAL HIGH (ref 0.0–2.0)
Bicarbonate: 26.1 mmol/L (ref 20.0–28.0)
Bicarbonate: 26.4 mmol/L (ref 20.0–28.0)
Bicarbonate: 26.6 mmol/L (ref 20.0–28.0)
Calcium, Ion: 1.06 mmol/L — ABNORMAL LOW (ref 1.15–1.40)
Calcium, Ion: 1.11 mmol/L — ABNORMAL LOW (ref 1.15–1.40)
Calcium, Ion: 1.12 mmol/L — ABNORMAL LOW (ref 1.15–1.40)
HCT: 25 % — ABNORMAL LOW (ref 36.0–46.0)
HCT: 25 % — ABNORMAL LOW (ref 36.0–46.0)
HCT: 25 % — ABNORMAL LOW (ref 36.0–46.0)
Hemoglobin: 8.5 g/dL — ABNORMAL LOW (ref 12.0–15.0)
Hemoglobin: 8.5 g/dL — ABNORMAL LOW (ref 12.0–15.0)
Hemoglobin: 8.5 g/dL — ABNORMAL LOW (ref 12.0–15.0)
O2 Saturation: 67 %
O2 Saturation: 68 %
O2 Saturation: 69 %
Potassium: 4.2 mmol/L (ref 3.5–5.1)
Potassium: 4.4 mmol/L (ref 3.5–5.1)
Potassium: 4.4 mmol/L (ref 3.5–5.1)
Sodium: 142 mmol/L (ref 135–145)
Sodium: 142 mmol/L (ref 135–145)
Sodium: 143 mmol/L (ref 135–145)
TCO2: 27 mmol/L (ref 22–32)
TCO2: 27 mmol/L (ref 22–32)
TCO2: 28 mmol/L (ref 22–32)
pCO2, Ven: 34.9 mmHg — ABNORMAL LOW (ref 44.0–60.0)
pCO2, Ven: 35 mmHg — ABNORMAL LOW (ref 44.0–60.0)
pCO2, Ven: 35.4 mmHg — ABNORMAL LOW (ref 44.0–60.0)
pH, Ven: 7.476 — ABNORMAL HIGH (ref 7.250–7.430)
pH, Ven: 7.485 — ABNORMAL HIGH (ref 7.250–7.430)
pH, Ven: 7.49 — ABNORMAL HIGH (ref 7.250–7.430)
pO2, Ven: 32 mmHg (ref 32.0–45.0)
pO2, Ven: 32 mmHg (ref 32.0–45.0)
pO2, Ven: 33 mmHg (ref 32.0–45.0)

## 2019-05-30 LAB — CBC
HCT: 23.3 % — ABNORMAL LOW (ref 36.0–46.0)
HCT: 24.5 % — ABNORMAL LOW (ref 36.0–46.0)
Hemoglobin: 7.8 g/dL — ABNORMAL LOW (ref 12.0–15.0)
Hemoglobin: 8.1 g/dL — ABNORMAL LOW (ref 12.0–15.0)
MCH: 25.9 pg — ABNORMAL LOW (ref 26.0–34.0)
MCH: 26 pg (ref 26.0–34.0)
MCHC: 33.5 g/dL (ref 30.0–36.0)
MCHC: 33.1 g/dL (ref 30.0–36.0)
MCV: 77.4 fL — ABNORMAL LOW (ref 80.0–100.0)
MCV: 78.8 fL — ABNORMAL LOW (ref 80.0–100.0)
Platelets: 288 10*3/uL (ref 150–400)
Platelets: 311 10*3/uL (ref 150–400)
RBC: 3.01 MIL/uL — ABNORMAL LOW (ref 3.87–5.11)
RBC: 3.11 MIL/uL — ABNORMAL LOW (ref 3.87–5.11)
RDW: 15.1 % (ref 11.5–15.5)
RDW: 15.2 % (ref 11.5–15.5)
WBC: 15.5 10*3/uL — ABNORMAL HIGH (ref 4.0–10.5)
WBC: 16 10*3/uL — ABNORMAL HIGH (ref 4.0–10.5)
nRBC: 0 % (ref 0.0–0.2)
nRBC: 0 % (ref 0.0–0.2)

## 2019-05-30 LAB — BASIC METABOLIC PANEL
Anion gap: 8 (ref 5–15)
BUN: 33 mg/dL — ABNORMAL HIGH (ref 8–23)
CO2: 25 mmol/L (ref 22–32)
Calcium: 8.1 mg/dL — ABNORMAL LOW (ref 8.9–10.3)
Chloride: 105 mmol/L (ref 98–111)
Creatinine, Ser: 1.96 mg/dL — ABNORMAL HIGH (ref 0.44–1.00)
GFR calc Af Amer: 28 mL/min — ABNORMAL LOW (ref >60)
GFR calc non Af Amer: 24 mL/min — ABNORMAL LOW (ref >60)
Glucose, Bld: 84 mg/dL (ref 70–99)
Potassium: 4.3 mmol/L (ref 3.5–5.1)
Sodium: 138 mmol/L (ref 135–145)

## 2019-05-30 LAB — CK: Total CK: 21 U/L — ABNORMAL LOW (ref 38–234)

## 2019-05-30 LAB — MAGNESIUM: Magnesium: 2.1 mg/dL (ref 1.7–2.4)

## 2019-05-30 LAB — CREATININE, SERUM
Creatinine, Ser: 2.1 mg/dL — ABNORMAL HIGH (ref 0.44–1.00)
GFR calc Af Amer: 26 mL/min — ABNORMAL LOW (ref >60)
GFR calc non Af Amer: 22 mL/min — ABNORMAL LOW (ref >60)

## 2019-05-30 SURGERY — RIGHT HEART CATH

## 2019-05-30 MED ORDER — SODIUM CHLORIDE 0.9 % IV SOLN: 250.0000 mL | INTRAVENOUS | Status: DC | PRN

## 2019-05-30 MED ORDER — APIXABAN 2.5 MG PO TABS
2.5000 mg | ORAL_TABLET | Freq: Two times a day (BID) | ORAL | Status: DC
  Administered 2019-05-31 – 2019-06-07 (×15): 2.5 mg via ORAL
  Filled 2019-05-30 (×17): qty 1

## 2019-05-30 MED ORDER — HEPARIN (PORCINE) IN NACL 1000-0.9 UT/500ML-% IV SOLN
INTRAVENOUS | Status: DC | PRN
  Administered 2019-05-30: 500 mL

## 2019-05-30 MED ORDER — ACETAMINOPHEN 325 MG PO TABS: 650.0000 mg | ORAL_TABLET | ORAL | Status: DC | PRN

## 2019-05-30 MED ORDER — HYDRALAZINE HCL 20 MG/ML IJ SOLN: 10.0000 mg | INTRAMUSCULAR | Status: AC | PRN

## 2019-05-30 MED ORDER — LIDOCAINE HCL (PF) 1 % IJ SOLN
INTRAMUSCULAR | Status: DC | PRN
  Administered 2019-05-30: 2 mL

## 2019-05-30 MED ORDER — LORATADINE 10 MG PO TABS
10.0000 mg | ORAL_TABLET | Freq: Every day | ORAL | Status: DC
  Administered 2019-05-30 – 2019-06-07 (×9): 10 mg via ORAL
  Filled 2019-05-30 (×10): qty 1

## 2019-05-30 MED ORDER — MECLIZINE HCL 25 MG PO TABS
25.0000 mg | ORAL_TABLET | Freq: Once | ORAL | Status: AC
  Administered 2019-05-30: 25 mg via ORAL
  Filled 2019-05-30: qty 1

## 2019-05-30 MED ORDER — SALINE SPRAY 0.65 % NA SOLN
1.0000 | NASAL | Status: DC | PRN
  Filled 2019-05-30: qty 44

## 2019-05-30 MED ORDER — SODIUM CHLORIDE 0.9 % IV SOLN: INTRAVENOUS | Status: DC

## 2019-05-30 MED ORDER — SODIUM CHLORIDE 0.9% FLUSH: 3.0000 mL | INTRAVENOUS | Status: DC | PRN

## 2019-05-30 MED ORDER — ENOXAPARIN SODIUM 30 MG/0.3ML ~~LOC~~ SOLN: 30.0000 mg | SUBCUTANEOUS | Status: DC

## 2019-05-30 MED ORDER — LABETALOL HCL 5 MG/ML IV SOLN: 10.0000 mg | INTRAVENOUS | Status: AC | PRN

## 2019-05-30 MED ORDER — SODIUM CHLORIDE 0.9% FLUSH
3.0000 mL | Freq: Two times a day (BID) | INTRAVENOUS | Status: DC
  Administered 2019-05-31 – 2019-06-07 (×12): 3 mL via INTRAVENOUS

## 2019-05-30 MED ORDER — SODIUM CHLORIDE 0.9 % IV SOLN
INTRAVENOUS | Status: DC
  Administered 2019-05-30: 06:00:00 via INTRAVENOUS

## 2019-05-30 SURGICAL SUPPLY — 8 items
CATH BALLN WEDGE 5F 110CM (CATHETERS) ×3 IMPLANT
KIT MICROPUNCTURE NIT STIFF (SHEATH) ×6 IMPLANT
PACK CARDIAC CATHETERIZATION (CUSTOM PROCEDURE TRAY) ×3 IMPLANT
PROTECTION STATION PRESSURIZED (MISCELLANEOUS) ×3
SHEATH GLIDE SLENDER 4/5FR (SHEATH) ×3 IMPLANT
SHEATH PROBE COVER 6X72 (BAG) ×3 IMPLANT
STATION PROTECTION PRESSURIZED (MISCELLANEOUS) ×1 IMPLANT
TRANSDUCER W/STOPCOCK (MISCELLANEOUS) ×3 IMPLANT

## 2019-05-30 NOTE — Progress Notes (Addendum)
Subjective Received a secure chart from patient's nurse about 4:10 PM stating that patient has complained of dizziness.  Hemodynamically stable.  Saturation appropriate.  Per RN, patient has no apparent focal neuro deficits other than generalized weakness.  Ordered meclizine and went to evaluate patient.  Upon arrival, patient was sitting in her bed without any distress.  She reports dizziness that she explains as things moving up and down.  She denies blurry vision, headache, focal numbness, weakness or tingling.  She denies chest pain.  Denies acute changes to her breathing.  Denies nausea or vomiting.  Objective Today's Vitals   05/30/19 1137 05/30/19 1245 05/30/19 1601 05/30/19 1638  BP: (!) 110/48 118/68 (!) 124/50 (!) 121/50  Pulse:  75 60 61  Resp: (!) 22 19 19 20   Temp:  98.4 F (36.9 C) 98 F (36.7 C)   TempSrc:  Oral Oral   SpO2:   100% 100%  Weight:      Height:      PainSc:       Body mass index is 26.65 kg/m.   GENERAL: No acute distress.  Sitting in bed comfortably. RESP:  No IWOB.  Fair air movement bilaterally. CVS:  RRR. Heart sounds normal.  ABD/GI/GU: Bowel sounds present. Soft. Non tender.  MSK/EXT:  Moves extremities. No apparent deformity.  Trace edema. SKIN: no apparent skin lesion or wound NEURO: Awake, alert and oriented appropriately.  PERRL.  EOM grossly intact but limited due to patient's inability to perform.  No facial droop.  Speech clear.  Motor 4/5 in all extremities.  Light sensation intact in all extremities.  Patellar reflex symmetric.  No pronator drift but bilateral arm weakness.  Finger-to-nose slow but intact. PSYCH: Calm. Normal affect.  Assessment and plan Dizziness: vertical vertigo?  Limited exam but reassuring.  Patient was not able to perform some of the exams particularly EOM.  -Order stat CT scan given limited neuro exam  -Frequent neuro exam tonight -Hold Eliquis and cardiac meds pending CT scan -Gave meclizine x1  20 minutes with  more than 50% spent in reviewing records, counseling patient/family and coordinating care.

## 2019-05-30 NOTE — H&P (View-Only) (Signed)
Advanced Heart Failure Rounding Note  PCP-Cardiologist: Dr. Haroldine Laws  EP: Dr. Caryl Comes   Subjective:    Remains on IV lasix. Creatinine 1.96> Weight up 1 pound. Still feels weak. Mild ab pain. No orthopnea or PND.   Objective:   Weight Range: 61.9 kg Body mass index is 26.65 kg/m.   Vital Signs:   Temp:  [97.4 F (36.3 C)-97.9 F (36.6 C)] 97.9 F (36.6 C) (12/02 2219) Pulse Rate:  [60-66] 61 (12/03 0622) Resp:  [15-18] 16 (12/03 0622) BP: (94-132)/(45-59) 120/55 (12/03 0622) SpO2:  [93 %-98 %] 97 % (12/03 0622) Weight:  [61.9 kg] 61.9 kg (12/03 0245) Last BM Date: 05/25/19  Weight change: Filed Weights   05/28/19 0500 05/29/19 0500 05/30/19 0245  Weight: 61.6 kg 61.3 kg 61.9 kg    Intake/Output:   Intake/Output Summary (Last 24 hours) at 05/30/2019 0745 Last data filed at 05/30/2019 0500 Gross per 24 hour  Intake 920 ml  Output 1750 ml  Net -830 ml      Physical Exam    General:  Frail, elderly  No resp difficulty HEENT: normal Neck: supple. no JVP 6-7. Carotids 2+ bilat; no bruits. No lymphadenopathy or thryomegaly appreciated. Cor: PMI nondisplaced. Regular rate & rhythm. No rubs, gallops or murmurs. Lungs: clear Abdomen: soft, mildly tender, nondistended. No hepatosplenomegaly. No bruits or masses. Good bowel sounds. Extremities: no cyanosis, clubbing, rash, tr edema Neuro: alert & orientedx3, cranial nerves grossly intact. moves all 4 extremities w/o difficulty. Affect pleasant   Telemetry   Sinus 70s Personally reviewed   EKG    No new EKG to review   Labs    CBC Recent Labs    05/29/19 1430 05/30/19 0406  WBC 19.0* 15.5*  NEUTROABS 14.9*  --   HGB 8.5* 7.8*  HCT 26.2* 23.3*  MCV 79.9* 77.4*  PLT 333 970   Basic Metabolic Panel Recent Labs    05/29/19 0352 05/30/19 0406  NA 138 138  K 4.4 4.3  CL 103 105  CO2 23 25  GLUCOSE 95 84  BUN 31* 33*  CREATININE 2.03* 1.96*  CALCIUM 8.3* 8.1*  MG 2.2 2.1   Liver Function  Tests Recent Labs    05/29/19 1430  AST 85*  ALT 38  ALKPHOS 100  BILITOT 1.1  PROT 7.3  ALBUMIN 2.1*   No results for input(s): LIPASE, AMYLASE in the last 72 hours. Cardiac Enzymes Recent Labs    05/30/19 0406  CKTOTAL 21*    BNP: BNP (last 3 results) Recent Labs    05/05/19 1849 05/23/19 0057 05/26/19 1822  BNP 1,012.9* 1,286.3* 1,587.2*    ProBNP (last 3 results) No results for input(s): PROBNP in the last 8760 hours.   D-Dimer No results for input(s): DDIMER in the last 72 hours. Hemoglobin A1C No results for input(s): HGBA1C in the last 72 hours. Fasting Lipid Panel No results for input(s): CHOL, HDL, LDLCALC, TRIG, CHOLHDL, LDLDIRECT in the last 72 hours. Thyroid Function Tests No results for input(s): TSH, T4TOTAL, T3FREE, THYROIDAB in the last 72 hours.  Invalid input(s): FREET3  Other results:   Imaging    No results found.   Medications:     Scheduled Medications: . [MAR Hold] amiodarone  200 mg Oral BID  . [MAR Hold] atorvastatin  40 mg Oral q1800  . [MAR Hold] carvedilol  3.125 mg Oral BID WC  . [MAR Hold] feeding supplement (ENSURE ENLIVE)  237 mL Oral Q24H  . [MAR Hold] ferrous  gluconate  324 mg Oral BID  . [MAR Hold] furosemide  80 mg Intravenous BID  . [MAR Hold] hydrALAZINE  12.5 mg Oral TID  . [MAR Hold] magnesium oxide  400 mg Oral Daily  . [MAR Hold] mexiletine  200 mg Oral BID  . [MAR Hold] pantoprazole  40 mg Oral Daily  . [MAR Hold] polyethylene glycol  17 g Oral Daily  . [MAR Hold] sodium chloride flush  3 mL Intravenous Q12H  . [MAR Hold] sodium chloride flush  3 mL Intravenous Q12H  . [MAR Hold] sucralfate  1 g Oral TID WC & HS    Infusions: . [MAR Hold] sodium chloride    . sodium chloride    . sodium chloride 10 mL/hr at 05/30/19 0613    PRN Medications: [MAR Hold] sodium chloride, sodium chloride, [MAR Hold] acetaminophen, [MAR Hold] albuterol, [MAR Hold] promethazine, [MAR Hold] senna-docusate, [MAR Hold]  sodium chloride flush, sodium chloride flush    Patient Profile   Stephanie L McCollumis a 73 y.o.femalewith a hx of chronic combined CHF/NICM EF 15% with paroxysmal sustained ventricular tachycardia s/p SJM AICD 2014, PAF, bradycardia requiring adjustment in lower device rate, mitral regurgitation, HTN, HLD, CKD stage IV (baseline 1.7-2.1), chronic anemia, moderate protein-calorie malnutrition, h/o GIBs (ulcers, gastritis, gastric polyp), prior tobacco use and prior remote ETOH abuse(quit 20 y/a), multiple hospital admissions over the last 6 months and failure to thrive admitted w/ acute on chronic systolic CHF.  Assessment/Plan   1. Acute on Chronic Systolic CHF: NICM. Most recent echo 7/20 showed further reduction in EF, down from previous baseline of 20-25% to 15%. GDMT limited by CKD. Not felt to be a candidate for advanced therapies (I.e VAD) given debility and renal failure. Also with h/o recurrent VT, not a candidate for home inotrope's.   - NYHA Class IIIb-IV symptoms -  Plan RHC today to help assess candidacy for possible advanced therapies. My suspicion is that she is too weak for advanced therapies (CKD and anemia also hurdles). - Continue coreg 3.125 bid - Continue hydralazine 12.5 tid - No longer on Imdur (was discontinued 10/20 ? 2/2 hypotension) - not a candidate for ARB/ dig or sprio w/ CKD.  - adjust diuretics based on results of RHC.    2. VT: h/o recurrent VT as noted above. Admitted 7/20 w/ VT storm. Currently on PO amiodarone 200 bid. Device interrogated yesterday. - No recurrent VT - continue to monitor on tele - closely monitor electrolytes (K and Mg) and keep WNL  3. Anemia: hgb 7.7 on admit. FOBT +. Evaluated by GI. No plans for further w/u given no overt GI bleeding (see explanation above) - hgb 7.8 - continue supp Fe - transfuse if hgb drops <7.0 - question if she needs Aranesp?  4. CKD, Stage IV: baseline SCr ~1.8-2.0. GFR 25 - slightly improved w/  diuresis, down from 2.1>>2.0> 1.96 - continue IV Lasix for relief of congestion/ CHF symptoms.  - monitor K closely     Length of Stay: [place], MD  05/30/2019, 7:45 AM  Advanced Heart Failure Team Pager [phone] (M-F; 7a - 4p)  Please contact [facility] for night-coverage after hours (4p -7a ) and weekends on [email or web address]

## 2019-05-30 NOTE — Progress Notes (Signed)
C/O visual changes and dizziness. Dr Cyndia Skeeters into to room to evaluate pt. Plan CT of head.

## 2019-05-30 NOTE — Plan of Care (Signed)
  Problem: Clinical Measurements: Goal: Respiratory complications will improve Outcome: Progressing   Problem: Safety: Goal: Ability to remain free from injury will improve Outcome: Progressing   

## 2019-05-30 NOTE — Care Management Important Message (Signed)
Important Message  Patient Details  Name: Stephanie Yates MRN: [id number] Date of Birth: [birth year]   Medicare Important Message Given:  Yes     Shelda Altes 05/30/2019, 1:34 PM

## 2019-05-30 NOTE — Progress Notes (Signed)
PROGRESS NOTE  Stephanie Yates IHK:[id number] DOB: 05/03/1946   PCP: Josetta Huddle, MD  Patient is from: Home  DOA: 05/26/2019 LOS: 4  Brief Narrative / Interim history: 73 year old female with history of systolic CHF/NICM (EF 10 to 15%), VT s/p AICD 2014 and biventricular PPM, paroxysmal A. fib/bradycardia, chronic anemia, CKD-4, HTN, HLD, moderate protein calorie malnutrition and GIB, prolonged QT/torsade and multiple hospitalizations presented to ED 11/38/20 with abdominal pain, nausea, vomiting, shortness of breath and lower extremity edema.   In ED, HDS.  W 16.  Hgb 7.7 (baseline 8-9).  Creatinine 2.0 (about baseline).  BNP 1587 (slightly higher than baseline).  CXR concerning for CHF.  CT abdomen and pelvis with right large inguinal hernia without obstruction or incarceration.  FOBT positive.  She was admitted for CHF exacerbation and GI bleed.  Started on IV diuretics.  Will give GI consulted.  Patient was evaluated by Dr. Ethlyn Daniels from [facility].  GI did not feel EGD or colonoscopy is necessary as she had several of them in the last 2 months.  GI suggested intermittent blood and iron transfusion unless rampant overt bleeding in which case she would need transfer to tertiary center given his significant cardiac history.  Advanced heart failure team consulted 05/28/2019.  Started on higher dose IV Lasix.  Patient had [place] on 05/30/2019 that revealed moderately elevated filling pressure with normal cardiac output.   Subjective: No major events overnight of this morning.  Just returned from right heart catheterization.  She says she is very hungry.  She says her breathing is still on and off.  She denies chest pain.  Continues to endorse right lower quadrant pain.  Denies dysuria.  Objective: Vitals:   05/30/19 0802 05/30/19 0803 05/30/19 0813 05/30/19 0851  BP:  (!) 143/78 (!) 144/76   Pulse: 74 75 72   Resp:  18 (!) 21   Temp:    98.2 F (36.8 C)  TempSrc:    Oral  SpO2:  100% 100%    Weight:      Height:        Intake/Output Summary (Last 24 hours) at 05/30/2019 1111 Last data filed at 05/30/2019 0500 Gross per 24 hour  Intake 200 ml  Output 1600 ml  Net -1400 ml   Filed Weights   05/28/19 0500 05/29/19 0500 05/30/19 0245  Weight: 61.6 kg 61.3 kg 61.9 kg    Examination:  GENERAL: No acute distress.  Appears well.  HEENT: MMM.  Vision and hearing grossly intact.  NECK: Supple.  Notable JVD RESP:  No IWOB. Good air movement bilaterally. CVS:  RRR. Heart sounds normal.  ABD/GI/GU: Bowel sounds present. Soft.  Tenderness over RLQ. MSK/EXT:  Moves extremities. No apparent deformity.  Trace edema bilaterally. SKIN: no apparent skin lesion or wound NEURO: Awake, alert and oriented appropriately.  No apparent focal neuro deficit. PSYCH: Calm. Normal affect.   Assessment & Plan: Acute on chronic systolic CHF/NICM: NYHA IIIb-IV.  Most recent echo with EF of 10 to 15%.  Per advanced HF team, patient declined palliative care/hospice in the past.  Patient has cardinal symptoms including dyspnea, orthopnea and PND.  Had 1.8 L UOP/24hr. weight does not look accurate.  Renal function improving. -RHC on 12/3 with moderately elevated filling pressure with normal cardiac output.  -Advanced HF managing-continue Lasix to 80 mg daily -Limited on options for GDMT due to CKD -On low-dose Coreg and low-dose hydralazine -Monitor fluid status, renal function and electrolytes -Sodium and fluid restriction  History of recurrent VT s/p AICD -Continue telemetry monitoring -Optimize K and Mg  Paroxysmal A. Fib/bradycardia -Coreg as above -Eliquis on hold due to GI bleed  Acute on chronic blood loss anemia in patient with GI bleed and chronic kidney disease Acute on chronic GI bleed-FOBT positive.  No scope per GI.  Consider tertiary center if significant bleeding.  GI signed off.  Anemia panel normal. -Hgb 8-9 (baseline)> 7.7 (admit)>> 7.7> 7.8 -Continue PPI -Monitor H&H   CKD-4: Renal function improving with diuretics. -Continue monitoring  Leukocytosis: Suspect demargination.  No signs of infection.  Improving. -Continue monitoring  Abdominal pain/tenderness: could be due to CHF, large right inguinal hernia and constipation.  CT A/P reassuring. -Treat CHF and constipation  Essential hypertension: Normotensive. -Cardiac meds and diuretics as above  Mild hyperkalemia: Resolved. -Continue monitoring  Constipation: Disimpacted 12/2. -Scheduled MiraLAX daily and Senokot twice daily as needed  Moderate protein calorie malnutrition: -Consult dietitian Pressure Injury 05/29/19 Buttocks Left;Right Stage I -  Intact skin with non-blanchable redness of a localized area usually over a bony prominence. (Active)  05/29/19 1105  Location: Buttocks  Location Orientation: Left;Right  Staging: Stage I -  Intact skin with non-blanchable redness of a localized area usually over a bony prominence.  Wound Description (Comments):   Present on Admission: Yes     Nutrition Problem: Increased nutrient needs Etiology: chronic illness  Signs/Symptoms: estimated needs  Interventions: Education   DVT prophylaxis: SCD Code Status: Partial.  No intubation. Family Communication: Updated patient's daughter over the phone 12/2. Disposition Plan: Remains inpatient Consultants: GI (signed off), advanced HF  Procedures:  12/3-RHC with moderately elevated filling pressure with normal cardiac output.   Microbiology summarized: COVID-19 negative  Sch Meds:  Scheduled Meds: . amiodarone  200 mg Oral BID  . atorvastatin  40 mg Oral q1800  . carvedilol  3.125 mg Oral BID WC  . [START ON 05/31/2019] enoxaparin (LOVENOX) injection  30 mg Subcutaneous Q24H  . feeding supplement (ENSURE ENLIVE)  237 mL Oral Q24H  . ferrous gluconate  324 mg Oral BID  . furosemide  80 mg Intravenous BID  . hydrALAZINE  12.5 mg Oral TID  . loratadine  10 mg Oral Daily  . magnesium oxide   400 mg Oral Daily  . mexiletine  200 mg Oral BID  . pantoprazole  40 mg Oral Daily  . polyethylene glycol  17 g Oral Daily  . sodium chloride flush  3 mL Intravenous Q12H  . sodium chloride flush  3 mL Intravenous Q12H  . sodium chloride flush  3 mL Intravenous Q12H  . sucralfate  1 g Oral TID WC & HS   Continuous Infusions: . sodium chloride    . sodium chloride     PRN Meds:.sodium chloride, sodium chloride, acetaminophen, albuterol, hydrALAZINE, labetalol, promethazine, senna-docusate, sodium chloride, sodium chloride flush, sodium chloride flush  Antimicrobials: Anti-infectives (From admission, onward)   None       I have personally reviewed the following labs and images: CBC: Recent Labs  Lab 05/27/19 0656 05/28/19 0408 05/29/19 0352 05/29/19 1430 05/30/19 0406  WBC 16.3* 18.5* 18.6* 19.0* 15.5*  NEUTROABS  --   --   --  14.9*  --   HGB 8.0* 7.7* 8.4* 8.5* 7.8*  HCT 24.3* 23.4* 25.5* 26.2* 23.3*  MCV 78.6* 78.0* 78.9* 79.9* 77.4*  PLT 342 314 342 333 288   BMP &GFR Recent Labs  Lab 05/26/19 1549 05/28/19 0408 05/28/19 1231 05/29/19 0352 05/30/19 0406  NA 141 141  --  138 138  K 3.1* 5.3*  --  4.4 4.3  CL 105 108  --  103 105  CO2 26 25  --  23 25  GLUCOSE 96 110*  --  95 84  BUN 30* 32*  --  31* 33*  CREATININE 2.08* 2.17*  --  2.03* 1.96*  CALCIUM 8.6* 8.3*  --  8.3* 8.1*  MG  --   --  2.1 2.2 2.1   Estimated Creatinine Clearance: 20.4 mL/min (A) (by C-G formula based on SCr of 1.96 mg/dL (H)). Liver & Pancreas: Recent Labs  Lab 05/26/19 1549 05/29/19 1430  AST 85* 85*  ALT 39 38  ALKPHOS 98 100  BILITOT 1.2 1.1  PROT 7.0 7.3  ALBUMIN 2.2* 2.1*   Recent Labs  Lab 05/26/19 1549  LIPASE 28   No results for input(s): AMMONIA in the last 168 hours. Diabetic: No results for input(s): HGBA1C in the last 72 hours. No results for input(s): GLUCAP in the last 168 hours. Cardiac Enzymes: Recent Labs  Lab 05/30/19 0406  CKTOTAL 21*   No  results for input(s): PROBNP in the last 8760 hours. Coagulation Profile: No results for input(s): INR, PROTIME in the last 168 hours. Thyroid Function Tests: No results for input(s): TSH, T4TOTAL, FREET4, T3FREE, THYROIDAB in the last 72 hours. Lipid Profile: No results for input(s): CHOL, HDL, LDLCALC, TRIG, CHOLHDL, LDLDIRECT in the last 72 hours. Anemia Panel: Recent Labs    05/27/19 1157 05/28/19 1644  VITAMINB12  --  755  FOLATE  --  14.6  FERRITIN  --  19  TIBC  --  311  IRON 19* 36  RETICCTPCT  --  2.4   Urine analysis:    Component Value Date/Time   COLORURINE YELLOW 05/23/2019 0848   APPEARANCEUR HAZY (A) 05/23/2019 0848   LABSPEC 1.013 05/23/2019 0848   PHURINE 6.0 05/23/2019 0848   GLUCOSEU NEGATIVE 05/23/2019 0848   HGBUR LARGE (A) 05/23/2019 0848   BILIRUBINUR NEGATIVE 05/23/2019 0848   KETONESUR NEGATIVE 05/23/2019 0848   PROTEINUR 30 (A) 05/23/2019 0848   UROBILINOGEN 1.0 08/29/2014 2035   NITRITE NEGATIVE 05/23/2019 0848   LEUKOCYTESUR NEGATIVE 05/23/2019 0848   Sepsis Labs: Invalid input(s): PROCALCITONIN, [place]  Microbiology: Recent Results (from the past 240 hour(s))  SARS CORONAVIRUS 2 (TAT 6-24 HRS) Nasopharyngeal Nasopharyngeal Swab     Status: None   Collection Time: 05/26/19 12:32 AM   Specimen: Nasopharyngeal Swab  Result Value Ref Range Status   SARS Coronavirus 2 NEGATIVE NEGATIVE Final    Comment: (NOTE) SARS-CoV-2 target nucleic acids are NOT DETECTED. The SARS-CoV-2 RNA is generally detectable in upper and lower respiratory specimens during the acute phase of infection. Negative results do not preclude SARS-CoV-2 infection, do not rule out co-infections with other pathogens, and should not be used as the sole basis for treatment or other patient management decisions. Negative results must be combined with clinical observations, patient history, and epidemiological information. The expected result is Negative. Fact Sheet for  Patients: SugarRoll.be Fact Sheet for Healthcare Providers: https://www.woods-mathews.com/ This test is not yet approved or cleared by the [place] FDA and  has been authorized for detection and/or diagnosis of SARS-CoV-2 by FDA under an Emergency Use Authorization (EUA). This EUA will remain  in effect (meaning this test can be used) for the duration of the COVID-19 declaration under Section 56 4(b)(1) of the Act, 21 U.S.C. section 360bbb-3(b)(1), unless the authorization is terminated or revoked  sooner. Performed at [facility], [place] [address], [place], [place] [address]     Radiology Studies: No results found.     T. [place]  If 7PM-7AM, please contact night-coverage [email or web address] Password [email or web address] 05/30/2019, 11:11 AM

## 2019-05-30 NOTE — Interval H&P Note (Signed)
History and Physical Interval Note:  05/30/2019 7:50 AM  Stephanie Yates  has presented today for surgery, with the diagnosis of heart failure.  The various methods of treatment have been discussed with the patient and family. After consideration of risks, benefits and other options for treatment, the patient has consented to  Procedure(s): RIGHT HEART CATH (N/A) as a surgical intervention.  The patient's history has been reviewed, patient examined, no change in status, stable for surgery.  I have reviewed the patient's chart and labs.  Questions were answered to the patient's satisfaction.     Daniel Bensimhon

## 2019-05-30 NOTE — Progress Notes (Signed)
Advanced Heart Failure Rounding Note  PCP-Cardiologist: Dr. Haroldine Laws  EP: Dr. Caryl Comes   Subjective:    Remains on IV lasix. Creatinine 1.96> Weight up 1 pound. Still feels weak. Mild ab pain. No orthopnea or PND.   Objective:   Weight Range: 61.9 kg Body mass index is 26.65 kg/m.   Vital Signs:   Temp:  [97.4 F (36.3 C)-97.9 F (36.6 C)] 97.9 F (36.6 C) (12/02 2219) Pulse Rate:  [60-66] 61 (12/03 0622) Resp:  [15-18] 16 (12/03 0622) BP: (94-132)/(45-59) 120/55 (12/03 0622) SpO2:  [93 %-98 %] 97 % (12/03 0622) Weight:  [61.9 kg] 61.9 kg (12/03 0245) Last BM Date: 05/25/19  Weight change: Filed Weights   05/28/19 0500 05/29/19 0500 05/30/19 0245  Weight: 61.6 kg 61.3 kg 61.9 kg    Intake/Output:   Intake/Output Summary (Last 24 hours) at 05/30/2019 0745 Last data filed at 05/30/2019 0500 Gross per 24 hour  Intake 920 ml  Output 1750 ml  Net -830 ml      Physical Exam    General:  Frail, elderly  No resp difficulty HEENT: normal Neck: supple. no JVP 6-7. Carotids 2+ bilat; no bruits. No lymphadenopathy or thryomegaly appreciated. Cor: PMI nondisplaced. Regular rate & rhythm. No rubs, gallops or murmurs. Lungs: clear Abdomen: soft, mildly tender, nondistended. No hepatosplenomegaly. No bruits or masses. Good bowel sounds. Extremities: no cyanosis, clubbing, rash, tr edema Neuro: alert & orientedx3, cranial nerves grossly intact. moves all 4 extremities w/o difficulty. Affect pleasant   Telemetry   Sinus 70s Personally reviewed   EKG    No new EKG to review   Labs    CBC Recent Labs    05/29/19 1430 05/30/19 0406  WBC 19.0* 15.5*  NEUTROABS 14.9*  --   HGB 8.5* 7.8*  HCT 26.2* 23.3*  MCV 79.9* 77.4*  PLT 333 578   Basic Metabolic Panel Recent Labs    05/29/19 0352 05/30/19 0406  NA 138 138  K 4.4 4.3  CL 103 105  CO2 23 25  GLUCOSE 95 84  BUN 31* 33*  CREATININE 2.03* 1.96*  CALCIUM 8.3* 8.1*  MG 2.2 2.1   Liver Function  Tests Recent Labs    05/29/19 1430  AST 85*  ALT 38  ALKPHOS 100  BILITOT 1.1  PROT 7.3  ALBUMIN 2.1*   No results for input(s): LIPASE, AMYLASE in the last 72 hours. Cardiac Enzymes Recent Labs    05/30/19 0406  CKTOTAL 21*    BNP: BNP (last 3 results) Recent Labs    05/05/19 1849 05/23/19 0057 05/26/19 1822  BNP 1,012.9* 1,286.3* 1,587.2*    ProBNP (last 3 results) No results for input(s): PROBNP in the last 8760 hours.   D-Dimer No results for input(s): DDIMER in the last 72 hours. Hemoglobin A1C No results for input(s): HGBA1C in the last 72 hours. Fasting Lipid Panel No results for input(s): CHOL, HDL, LDLCALC, TRIG, CHOLHDL, LDLDIRECT in the last 72 hours. Thyroid Function Tests No results for input(s): TSH, T4TOTAL, T3FREE, THYROIDAB in the last 72 hours.  Invalid input(s): FREET3  Other results:   Imaging    No results found.   Medications:     Scheduled Medications: . [MAR Hold] amiodarone  200 mg Oral BID  . [MAR Hold] atorvastatin  40 mg Oral q1800  . [MAR Hold] carvedilol  3.125 mg Oral BID WC  . [MAR Hold] feeding supplement (ENSURE ENLIVE)  237 mL Oral Q24H  . [MAR Hold] ferrous  gluconate  324 mg Oral BID  . [MAR Hold] furosemide  80 mg Intravenous BID  . [MAR Hold] hydrALAZINE  12.5 mg Oral TID  . [MAR Hold] magnesium oxide  400 mg Oral Daily  . [MAR Hold] mexiletine  200 mg Oral BID  . [MAR Hold] pantoprazole  40 mg Oral Daily  . [MAR Hold] polyethylene glycol  17 g Oral Daily  . [MAR Hold] sodium chloride flush  3 mL Intravenous Q12H  . [MAR Hold] sodium chloride flush  3 mL Intravenous Q12H  . [MAR Hold] sucralfate  1 g Oral TID WC & HS    Infusions: . [MAR Hold] sodium chloride    . sodium chloride    . sodium chloride 10 mL/hr at 05/30/19 0613    PRN Medications: [MAR Hold] sodium chloride, sodium chloride, [MAR Hold] acetaminophen, [MAR Hold] albuterol, [MAR Hold] promethazine, [MAR Hold] senna-docusate, [MAR Hold]  sodium chloride flush, sodium chloride flush    Patient Profile   Stephanie L McCollumis a 73 y.o.femalewith a hx of chronic combined CHF/NICM EF 15% with paroxysmal sustained ventricular tachycardia s/p SJM AICD 2014, PAF, bradycardia requiring adjustment in lower device rate, mitral regurgitation, HTN, HLD, CKD stage IV (baseline 1.7-2.1), chronic anemia, moderate protein-calorie malnutrition, h/o GIBs (ulcers, gastritis, gastric polyp), prior tobacco use and prior remote ETOH abuse(quit 20 y/a), multiple hospital admissions over the last 6 months and failure to thrive admitted w/ acute on chronic systolic CHF.  Assessment/Plan   1. Acute on Chronic Systolic CHF: NICM. Most recent echo 7/20 showed further reduction in EF, down from previous baseline of 20-25% to 15%. GDMT limited by CKD. Not felt to be a candidate for advanced therapies (I.e VAD) given debility and renal failure. Also with h/o recurrent VT, not a candidate for home inotrope's.   - NYHA Class IIIb-IV symptoms -  Plan RHC today to help assess candidacy for possible advanced therapies. My suspicion is that she is too weak for advanced therapies (CKD and anemia also hurdles). - Continue coreg 3.125 bid - Continue hydralazine 12.5 tid - No longer on Imdur (was discontinued 10/20 ? 2/2 hypotension) - not a candidate for ARB/ dig or sprio w/ CKD.  - adjust diuretics based on results of RHC.    2. VT: h/o recurrent VT as noted above. Admitted 7/20 w/ VT storm. Currently on PO amiodarone 200 bid. Device interrogated yesterday. - No recurrent VT - continue to monitor on tele - closely monitor electrolytes (K and Mg) and keep WNL  3. Anemia: hgb 7.7 on admit. FOBT +. Evaluated by GI. No plans for further w/u given no overt GI bleeding (see explanation above) - hgb 7.8 - continue supp Fe - transfuse if hgb drops <7.0 - question if she needs Aranesp?  4. CKD, Stage IV: baseline SCr ~1.8-2.0. GFR 25 - slightly improved w/  diuresis, down from 2.1>>2.0> 1.96 - continue IV Lasix for relief of congestion/ CHF symptoms.  - monitor K closely     Length of Stay: [place], MD  05/30/2019, 7:45 AM  Advanced Heart Failure Team Pager [phone] (M-F; 7a - 4p)  Please contact [facility] for night-coverage after hours (4p -7a ) and weekends on [email or web address]

## 2019-05-30 NOTE — Progress Notes (Signed)
C/O dizziness says her head is "spinning round and round". BP = 121/50, HR = 61 in paced rhythm. O2 sats = 100% on O2@2L  via [place]. Dr Cyndia Skeeters notified. Will come see pt. Order received for Meclizine.

## 2019-05-31 ENCOUNTER — Inpatient Hospital Stay (HOSPITAL_COMMUNITY): Payer: Medicare Other

## 2019-05-31 DIAGNOSIS — R4182 Altered mental status, unspecified: Secondary | ICD-10-CM

## 2019-05-31 DIAGNOSIS — I5023 Acute on chronic systolic (congestive) heart failure: Secondary | ICD-10-CM | POA: Diagnosis not present

## 2019-05-31 DIAGNOSIS — G9341 Metabolic encephalopathy: Secondary | ICD-10-CM

## 2019-05-31 LAB — GLUCOSE, CAPILLARY: Glucose-Capillary: 101 mg/dL — ABNORMAL HIGH (ref 70–99)

## 2019-05-31 LAB — BASIC METABOLIC PANEL
Anion gap: 7 (ref 5–15)
BUN: 34 mg/dL — ABNORMAL HIGH (ref 8–23)
CO2: 24 mmol/L (ref 22–32)
Calcium: 8.2 mg/dL — ABNORMAL LOW (ref 8.9–10.3)
Chloride: 105 mmol/L (ref 98–111)
Creatinine, Ser: 2.07 mg/dL — ABNORMAL HIGH (ref 0.44–1.00)
GFR calc Af Amer: 26 mL/min — ABNORMAL LOW (ref >60)
GFR calc non Af Amer: 23 mL/min — ABNORMAL LOW (ref >60)
Glucose, Bld: 98 mg/dL (ref 70–99)
Potassium: 4.3 mmol/L (ref 3.5–5.1)
Sodium: 136 mmol/L (ref 135–145)

## 2019-05-31 LAB — CBC
HCT: 24.8 % — ABNORMAL LOW (ref 36.0–46.0)
Hemoglobin: 8.1 g/dL — ABNORMAL LOW (ref 12.0–15.0)
MCH: 25.6 pg — ABNORMAL LOW (ref 26.0–34.0)
MCHC: 32.7 g/dL (ref 30.0–36.0)
MCV: 78.2 fL — ABNORMAL LOW (ref 80.0–100.0)
Platelets: 262 10*3/uL (ref 150–400)
RBC: 3.17 MIL/uL — ABNORMAL LOW (ref 3.87–5.11)
RDW: 15 % (ref 11.5–15.5)
WBC: 15.6 10*3/uL — ABNORMAL HIGH (ref 4.0–10.5)
nRBC: 0 % (ref 0.0–0.2)

## 2019-05-31 LAB — AMMONIA: Ammonia: 105 umol/L — ABNORMAL HIGH (ref 9–35)

## 2019-05-31 LAB — MAGNESIUM: Magnesium: 2.2 mg/dL (ref 1.7–2.4)

## 2019-05-31 LAB — HEMOGLOBIN A1C
Hgb A1c MFr Bld: 5.6 % (ref 4.8–5.6)
Mean Plasma Glucose: 114.02 mg/dL

## 2019-05-31 MED ORDER — IOHEXOL 350 MG/ML SOLN
100.0000 mL | Freq: Once | INTRAVENOUS | Status: AC | PRN
  Administered 2019-05-31: 16:00:00 100 mL via INTRAVENOUS

## 2019-05-31 MED ORDER — OXYCODONE-ACETAMINOPHEN 5-325 MG PO TABS
1.0000 | ORAL_TABLET | Freq: Once | ORAL | Status: AC
  Administered 2019-05-31: 1 via ORAL
  Filled 2019-05-31: qty 1

## 2019-05-31 MED ORDER — INSULIN ASPART 100 UNIT/ML ~~LOC~~ SOLN
0.0000 [IU] | Freq: Three times a day (TID) | SUBCUTANEOUS | Status: DC
  Administered 2019-06-03: 1 [IU] via SUBCUTANEOUS

## 2019-05-31 MED ORDER — LACTULOSE 10 GM/15ML PO SOLN
20.0000 g | Freq: Three times a day (TID) | ORAL | Status: DC
  Administered 2019-05-31 – 2019-06-01 (×2): 20 g via ORAL
  Filled 2019-05-31 (×2): qty 30

## 2019-05-31 MED ORDER — FUROSEMIDE 40 MG PO TABS
40.0000 mg | ORAL_TABLET | Freq: Every day | ORAL | Status: DC
  Administered 2019-06-01 – 2019-06-07 (×7): 40 mg via ORAL
  Filled 2019-05-31 (×8): qty 1

## 2019-05-31 NOTE — Progress Notes (Signed)
Pt has MR unsafe defibrillator, cannot perform MRI. RN aware.

## 2019-05-31 NOTE — Progress Notes (Signed)
EEG complete - results pending 

## 2019-05-31 NOTE — Progress Notes (Addendum)
Advanced Heart Failure Rounding Note  PCP-Cardiologist: Dr. Haroldine Laws  EP: Dr. Caryl Comes   Subjective:    Yesterday had [place] as noted below. Had issues with blurred vision. CT of head was negative. She continued to diurese with IV lasix. Weight down 1 pound.   Still have some blurred vision in her right eye. Complaining of fatigue.   RA = 8 RV = 58/12 PA = 67/13 (41) PCW = 24 Fick cardiac output/index = 6.4/4.0 PVR = 2.6 WU Ao sat = 99% PA sat = 67%, 68% High SVC sat = 69%  Objective:   Weight Range: 61.5 kg Body mass index is 26.48 kg/m.   Vital Signs:   Temp:  [97.4 F (36.3 C)-98.4 F (36.9 C)] 97.6 F (36.4 C) (12/04 0844) Pulse Rate:  [60-75] 60 (12/04 0811) Resp:  [14-22] 14 (12/04 0811) BP: (105-141)/(48-79) 126/64 (12/04 0811) SpO2:  [97 %-100 %] 100 % (12/03 2353) Weight:  [61.5 kg] 61.5 kg (12/04 0422) Last BM Date: 05/29/19  Weight change: Filed Weights   05/29/19 0500 05/30/19 0245 05/31/19 0422  Weight: 61.3 kg 61.9 kg 61.5 kg    Intake/Output:   Intake/Output Summary (Last 24 hours) at 05/31/2019 1002 Last data filed at 05/31/2019 0844 Gross per 24 hour  Intake 360 ml  Output 800 ml  Net -440 ml      Physical Exam   General:  Appears frail. No resp difficulty HEENT: normal anicteric  Neck: supple. JVP 6-7 . Carotids 2+ bilat; no bruits. No lymphadenopathy or thryomegaly appreciated. Cor: PMI nondisplaced. Regular rate & rhythm. No rubs, gallops or murmurs. Lungs: clear no wheeze Abdomen: soft, nontender, nondistended. No hepatosplenomegaly. No bruits or masses. Good bowel sounds. Extremities: no cyanosis, clubbing, rash, edema Neuro: alert & oriented x 3, cranial nerves grossly intact. moves all 4 extremities w/o difficulty. Affect flat   Telemetry   SR 70s personally reviewed.    EKG    No new EKG to review   Labs    CBC Recent Labs    05/29/19 1430  05/30/19 1033 05/31/19 0324  WBC 19.0*   < > 16.0* 15.6*  NEUTROABS  14.9*  --   --   --   HGB 8.5*   < > 8.1* 8.1*  HCT 26.2*   < > 24.5* 24.8*  MCV 79.9*   < > 78.8* 78.2*  PLT 333   < > 311 262   < > = values in this interval not displayed.   Basic Metabolic Panel Recent Labs    05/30/19 0406  05/30/19 0814 05/30/19 1033 05/31/19 0324  NA 138   < > 143  --  136  K 4.3   < > 4.2  --  4.3  CL 105  --   --   --  105  CO2 25  --   --   --  24  GLUCOSE 84  --   --   --  98  BUN 33*  --   --   --  34*  CREATININE 1.96*  --   --  2.10* 2.07*  CALCIUM 8.1*  --   --   --  8.2*  MG 2.1  --   --   --  2.2   < > = values in this interval not displayed.   Liver Function Tests Recent Labs    05/29/19 1430  AST 85*  ALT 38  ALKPHOS 100  BILITOT 1.1  PROT 7.3  ALBUMIN  2.1*   No results for input(s): LIPASE, AMYLASE in the last 72 hours. Cardiac Enzymes Recent Labs    05/30/19 0406  CKTOTAL 21*    BNP: BNP (last 3 results) Recent Labs    05/05/19 1849 05/23/19 0057 05/26/19 1822  BNP 1,012.9* 1,286.3* 1,587.2*    ProBNP (last 3 results) No results for input(s): PROBNP in the last 8760 hours.   D-Dimer No results for input(s): DDIMER in the last 72 hours. Hemoglobin A1C No results for input(s): HGBA1C in the last 72 hours. Fasting Lipid Panel No results for input(s): CHOL, HDL, LDLCALC, TRIG, CHOLHDL, LDLDIRECT in the last 72 hours. Thyroid Function Tests No results for input(s): TSH, T4TOTAL, T3FREE, THYROIDAB in the last 72 hours.  Invalid input(s): FREET3  Other results:   Imaging    Ct Head Wo Contrast  Result Date: 05/30/2019 CLINICAL DATA:  Vertigo EXAM: CT HEAD WITHOUT CONTRAST TECHNIQUE: Contiguous axial images were obtained from the base of the skull through the vertex without intravenous contrast. COMPARISON:  04/15/2019 FINDINGS: Brain: No acute intracranial abnormality. Specifically, no hemorrhage, hydrocephalus, mass lesion, acute infarction, or significant intracranial injury. Vascular: No hyperdense vessel  or unexpected calcification. Skull: No acute calvarial abnormality. Sinuses/Orbits: Diffuse disease throughout the paranasal sinuses with extensive mucosal thickening. Near complete opacification of the maxillary, frontal and sphenoid sinuses. Mastoid air cells clear. Other: None IMPRESSION: No acute intracranial abnormality. Severe chronic sinusitis. Electronically Signed   By: Rolm Baptise M.D.   On: 05/30/2019 21:40     Medications:     Scheduled Medications: . amiodarone  200 mg Oral BID  . apixaban  2.5 mg Oral BID  . atorvastatin  40 mg Oral q1800  . carvedilol  3.125 mg Oral BID WC  . feeding supplement (ENSURE ENLIVE)  237 mL Oral Q24H  . ferrous gluconate  324 mg Oral BID  . furosemide  80 mg Intravenous BID  . hydrALAZINE  12.5 mg Oral TID  . loratadine  10 mg Oral Daily  . magnesium oxide  400 mg Oral Daily  . mexiletine  200 mg Oral BID  . pantoprazole  40 mg Oral Daily  . polyethylene glycol  17 g Oral Daily  . sodium chloride flush  3 mL Intravenous Q12H  . sodium chloride flush  3 mL Intravenous Q12H  . sodium chloride flush  3 mL Intravenous Q12H  . sucralfate  1 g Oral TID WC & HS    Infusions: . sodium chloride    . sodium chloride      PRN Medications: sodium chloride, sodium chloride, acetaminophen, albuterol, promethazine, senna-docusate, sodium chloride, sodium chloride flush, sodium chloride flush    Patient Profile   Stephanie Roselli McCollumis a 73 y.o.femalewith a hx of chronic combined CHF/NICM EF 15% with paroxysmal sustained ventricular tachycardia s/p SJM AICD 2014, PAF, bradycardia requiring adjustment in lower device rate, mitral regurgitation, HTN, HLD, CKD stage IV (baseline 1.7-2.1), chronic anemia, moderate protein-calorie malnutrition, h/o GIBs (ulcers, gastritis, gastric polyp), prior tobacco use and prior remote ETOH abuse(quit 20 y/a), multiple hospital admissions over the last 6 months and failure to thrive admitted w/ acute on chronic  systolic CHF.  Assessment/Plan   1. Acute on Chronic Systolic CHF: NICM. Most recent echo 7/20 showed further reduction in EF, down from previous baseline of 20-25% to 15%. GDMT limited by CKD. Not felt to be a candidate for advanced therapies (I.e VAD) given debility and renal failure. Also with h/o recurrent VT, not a candidate for  home inotrope's.   RHC on 12/3 with moderately elevated filling pressures  -  RHC with moderately elevated filling pressures and preserved cardiac output.  - Volume status improved. Stop IV lasix. Start 40 mg po lasix tomorrow.  - Continue coreg 3.125 bid - Continue hydralazine 12.5 tid - No longer on Imdur (was discontinued 10/20 ? 2/2 hypotension) - not a candidate for ARB/ dig or sprio w/ CKD.   2. VT: h/o recurrent VT as noted above. Admitted 7/20 w/ VT storm. Currently on PO amiodarone 200 bid. Device interrogated yesterday. - No recurrent VT  - continue to monitor on tele - closely monitor electrolytes (K and Mg) and keep WNL  3. Anemia: hgb 7.7 on admit. FOBT +. Evaluated by GI. No plans for further w/u given no overt GI bleeding (see explanation above) - hgb 8.1  - continue supp Fe - transfuse if hgb drops <7.0 - question if she needs Aranesp?  4. CKD, Stage IV: baseline SCr ~1.8-2.0. GFR 25 - slightly improved w/ diuresis, down from 2.1>>2.0> 1.96>>2.07     Length of Stay: [place], NP  05/31/2019, 10:02 AM  Advanced Heart Failure Team Pager [phone] (M-F; 7a - 4p)  Please contact [facility] for night-coverage after hours (4p -7a ) and weekends on [email or web address]  Patient seen and examined with the above-signed Advanced Practice Provider and/or Housestaff. I personally reviewed laboratory data, imaging studies and relevant notes. I independently examined the patient and formulated the important aspects of the plan. I have edited the note to reflect any of my changes or salient points. I have personally discussed the plan with the  patient and/or family.  RHC results reviewed with her. Volume status mildly elevated with normal CO. I do not think she is candidate for advanced HF therapies. Would increase lasix to 40 daily.   We will arrange for HF f/u as outpatient.   We will sign off.   Continue previous outpatient HF meds with increase lasix to 40 daily.   Glori Bickers, MD  12:49 PM

## 2019-05-31 NOTE — Progress Notes (Signed)
Physical Therapy Treatment Patient Details Name: Stephanie Yates MRN: [id number] DOB: [birth year] Today's Date: 05/31/2019    History of Present Illness pt is a 73 yo female presented to ED with c/o abdominal pain, B LE swelling. Several recent admissions for B LE swelling, SOB, chronic large R inguinal hernia. pt with vision changes and dizziness, Head CT 12/3- unremarkable. PMH: CHF, HTN, afib, HLD, CKD    PT Comments    Patient not progressing towards PT goals. Noted to have decrease in functional and cognitive status today especially compared to interactions with nursing earlier in day as well as compared to PT session 2 days ago. Today, pt requires Max A for bed mobility and Min guard-Mod A for static sitting balance with truncal ataxia and LOB anteriorly. Pt walking 35' two days ago with PT. During today's session, pt noted to have left sided weakness, left incoordination, ataxia, dizziness, decreased sensation LLE, and garbled speech. Concern for stroke. RN and charge nurse notified. Code stroke called. Notified MD. Would consider Brain MRI to rule out stroke. Will continue to follow and progress as tolerated.   Follow Up Recommendations  Home health PT;Supervision for mobility/OOB     Equipment Recommendations  None recommended by PT    Recommendations for Other Services       Precautions / Restrictions Precautions Precautions: Fall Restrictions Weight Bearing Restrictions: No    Mobility  Bed Mobility Overal bed mobility: Needs Assistance Bed Mobility: Supine to Sit;Sit to Supine     Supine to sit: Max assist;HOB elevated Sit to supine: Total assist;HOB elevated   General bed mobility comments: Requires assist with BLEs and trunk to get to EOB, increased time. Difficulty moving LLE. Total A to bring LEs into bed and lower trunk.  Transfers                 General transfer comment: Deferred due to poor static sitting balance, ataxia and  dizziness.  Ambulation/Gait                 Stairs             Wheelchair Mobility    Modified Rankin (Stroke Patients Only) Modified Rankin (Stroke Patients Only) Pre-Morbid Rankin Score: Slight disability Modified Rankin: Moderately severe disability     Balance Overall balance assessment: Needs assistance Sitting-balance support: Single extremity supported;Feet supported;Bilateral upper extremity supported Sitting balance-Leahy Scale: Poor Sitting balance - Comments: Poor static sitting balance with ataxia and LOB anteriorly requiring Mod A-Min guard for support. Postural control: (anterior lean)     Standing balance comment: Unable                            Cognition Arousal/Alertness: Lethargic Behavior During Therapy: Flat affect Overall Cognitive Status: Impaired/Different from baseline Area of Impairment: Attention;Memory;Following commands;Safety/judgement;Problem solving                   Current Attention Level: Focused Memory: Decreased short-term memory Following Commands: Follows one step commands with increased time Safety/Judgement: Decreased awareness of deficits;Decreased awareness of safety   Problem Solving: Difficulty sequencing;Decreased initiation;Requires verbal cues;Slow processing;Requires tactile cues General Comments: Knows she is in the hospital; mumbled speech noted. Not sure why she is here. Poor attention; requires cues to stay attended to task. Delayed processing.      Exercises      General Comments General comments (skin integrity, edema, etc.): VSS; pt noted to have left sided  weakness, left incoordination, ataxia, dizziness, decreased sensation LLE, and garbled speech during session. RN and charge nurse notified. Code stroke called.      Pertinent Vitals/Pain Pain Assessment: Faces Faces Pain Scale: Hurts whole lot Pain Location: bil heels Pain Descriptors / Indicators:  Tender;Discomfort;Grimacing;Guarding;Moaning Pain Intervention(s): Monitored during session;Repositioned;Limited activity within patient's tolerance    Home Living                      Prior Function            PT Goals (current goals can now be found in the care plan section) Progress towards PT goals: Not progressing toward goals - comment(secondary to new symptoms of unilateral weakness, incoordination, dizziness etc)    Frequency    Min 3X/week      PT Plan Current plan remains appropriate    Co-evaluation              AM-PAC PT "6 Clicks" Mobility   Outcome Measure  Help needed turning from your back to your side while in a flat bed without using bedrails?: A Lot Help needed moving from lying on your back to sitting on the side of a flat bed without using bedrails?: A Lot Help needed moving to and from a bed to a chair (including a wheelchair)?: Total Help needed standing up from a chair using your arms (e.g., wheelchair or bedside chair)?: Total Help needed to walk in hospital room?: Total Help needed climbing 3-5 steps with a railing? : Total 6 Click Score: 8    End of Session Equipment Utilized During Treatment: Oxygen Activity Tolerance: Treatment limited secondary to medical complications (Comment)(symptoms of stroke) Patient left: in bed;with call bell/phone within reach;with bed alarm set;Other (comment);with nursing/sitter in room(Prevalon boots) Nurse Communication: Mobility status;Other (comment)(concern for stroke symptoms) PT Visit Diagnosis: Muscle weakness (generalized) (M62.81);Difficulty in walking, not elsewhere classified (R26.2);Hemiplegia and hemiparesis Hemiplegia - Right/Left: Left Hemiplegia - dominant/non-dominant: Non-dominant     Time: 1340-1433 PT Time Calculation (min) (ACUTE ONLY): 53 min  Charges:  $Therapeutic Activity: 23-37 mins $Neuromuscular Re-education: 8-22 mins                     Marisa Severin, PT, DPT Acute  Rehabilitation Services Pager [phone] Office [phone]       Marguarite Arbour A Sabra Heck 05/31/2019, 3:40 PM

## 2019-05-31 NOTE — Progress Notes (Addendum)
PROGRESS NOTE  Stephanie Yates WJX:[id number] DOB: 1946/04/15   PCP: Josetta Huddle, MD  Patient is from: Home  DOA: 05/26/2019 LOS: 5  Brief Narrative / Interim history: 73 year old female with history of systolic CHF/NICM (EF 10 to 15%), VT s/p AICD 2014 and biventricular PPM, paroxysmal A. fib/bradycardia, chronic anemia, CKD-4, HTN, HLD, moderate protein calorie malnutrition and GIB, prolonged QT/torsade and multiple hospitalizations presented to ED 11/38/20 with abdominal pain, nausea, vomiting, shortness of breath and lower extremity edema.   In ED, HDS.  W 16.  Hgb 7.7 (baseline 8-9).  Creatinine 2.0 (about baseline).  BNP 1587 (slightly higher than baseline).  CXR concerning for CHF.  CT abdomen and pelvis with right large inguinal hernia without obstruction or incarceration.  FOBT positive.  She was admitted for CHF exacerbation and GI bleed.  Started on IV diuretics.  Will give GI consulted.  Patient was evaluated by Dr. Ethlyn Daniels from [facility].  GI did not feel EGD or colonoscopy is necessary as she had several of them in the last 2 months.  GI suggested intermittent blood and iron transfusion unless rampant overt bleeding in which case she would need transfer to tertiary center given his significant cardiac history.  Advanced heart failure team consulted 05/28/2019.  Started on higher dose IV Lasix.  Patient had [place] on 05/30/2019 that revealed moderately elevated filling pressure with normal cardiac output.   On 05/30/2019, patient developed dizziness that she described as tenderness moving up and down.  CT head without contrast obtained but no acute finding. Neuro exam although limited was not impressive except for generalized weakness.  She was given meclizine x1.  Of note, patient has history of dizziness for the last 3 months per patient's daughter.  On 05/31/2019, had some ataxia, left-sided arm weakness and dysarthria when evaluated by PT.  Code stroke activated.  Stat CT head, CTA  head and neck ordered.  Subjective: Patient had an episode of dizziness yesterday that has resolved this morning.  CT head is negative.  Her main complaint is left heel pain.  Still with some shortness of breath.  Denies chest pain or cough.   Later in the day, patient was noted to have ataxia, left arm weakness and dysarthria during evaluation by PT. code stroke activated.  Stat CT head ordered.  Patient was on the way to CT when I went up to evaluate her. She seemed to have LUE weakness on brief exam.   Objective: Vitals:   05/31/19 0811 05/31/19 0844 05/31/19 1009 05/31/19 1420  BP: 126/64  127/87 (!) 135/55  Pulse: 60     Resp: 14   17  Temp:  97.6 F (36.4 C)  97.7 F (36.5 C)  TempSrc:  Oral    SpO2:    100%  Weight:      Height:        Intake/Output Summary (Last 24 hours) at 05/31/2019 1516 Last data filed at 05/31/2019 1143 Gross per 24 hour  Intake 846 ml  Output 400 ml  Net 446 ml   Filed Weights   05/29/19 0500 05/30/19 0245 05/31/19 0422  Weight: 61.3 kg 61.9 kg 61.5 kg    Examination:  GENERAL: No acute distress.  Appears well.  HEENT: MMM.  Vision and hearing grossly intact.  NECK: Supple.  Notable JVD. RESP:  No IWOB.  Fair air movement bilaterally CVS:  RRR. Heart sounds normal.  ABD/GI/GU: Bowel sounds present. Soft. Non tender.  MSK/EXT:  No apparent deformity or edema.  Weakness in all extremities. SKIN: Stage I pressure injury in left heel. NEURO: Sleepy but rises to voice.  Oriented to self, family and place.  PERRL.  Hearing grossly intact but limited due to patient's inability to perform.  No facial droop.  Speech clear.  Tongue protrusion midline.  Grip strength is 4/5 bilaterally.  4/5 at elbow.  3+/5 at shoulders bilaterally.  No pronator drift.  Not able to perform finger-to-nose.  PSYCH: Calm. Normal affect.  This exam after she returned from head CT is similar to her exam from earlier this morning.  Assessment & Plan: Stroke-like  symptoms/encephalopathy: patient has some generalized weakness and and possible symmetric ataxia but no focal neuro deficit on my exam although her exam is limited.  She is a sleepy but rises to voice easily.  Oriented x3.  Of note, patient has had intermittent "dizziness" for the last 3 months. -Review of chart reveals that patient was given Percocet overnight.  She also received meclizine in the evening for dizziness. -CT head without contrast negative x2. -Code stroke activated-CTA head and neck and EEG ordered.  Acute on chronic systolic CHF/NICM: NYHA IIIb-IV.  most recent echo with EF of 10 to 15%.  Per advanced HF team, patient declined palliative care/hospice in the past.  Patient has cardinal symptoms including dyspnea, orthopnea and PND.  I&O incomplete.  Renal function stable. -RHC on 12/3 with moderately elevated filling pressure with normal cardiac output.  -Advanced HF managing-plan to start p.o. Lasix 40 mg daily 12/5 -Limited on options for GDMT due to CKD -On low-dose Coreg and low-dose hydralazine -Monitor fluid status, renal function and electrolytes -Sodium and fluid restriction -May have to hold diuretics and cardiac meds until we exclude CVA.  History of recurrent VT s/p AICD -Continue telemetry monitoring -Optimize K and Mg  Paroxysmal A. Fib/bradycardia -Coreg as above -On Eliquis for anticoagulation.  Acute on chronic blood loss anemia in patient with GI bleed and chronic kidney disease Acute on chronic GI bleed-FOBT positive.  No scope per GI.  Consider tertiary center if significant bleeding.  GI signed off.  Anemia panel normal. -Hgb 8-9 (baseline)> 7.7 (admit)>> 7.7> 7.8>8.1 -Continue PPI -Monitor H&H  CKD-4: Stable. -Continue monitoring  Leukocytosis: No obvious source of infection.  She has abdominal tenderness on exam.  Initial CT abdomen and pelvis reassuring.  CT head with chronic sinusitis. -May have to repeat CT abdomen and pelvis without  contrast  Abdominal pain/tenderness: could be due to CHF, large right inguinal hernia and constipation.  Initial CT A/P reassuring.  -Repeat CT abdomen and pelvis as above.  Essential hypertension: Normotensive. -Cardiac meds and diuretics as above  Mild hyperkalemia: Resolved. -Continue monitoring  Constipation: Disimpacted 12/2. -Scheduled MiraLAX daily and Senokot twice daily as needed  Moderate protein calorie malnutrition: -Consult dietitian Pressure Injury 05/29/19 Buttocks Left;Right Stage I -  Intact skin with non-blanchable redness of a localized area usually over a bony prominence. (Active)  05/29/19 1105  Location: Buttocks  Location Orientation: Left;Right  Staging: Stage I -  Intact skin with non-blanchable redness of a localized area usually over a bony prominence.  Wound Description (Comments):   Present on Admission: Yes     Nutrition Problem: Increased nutrient needs Etiology: chronic illness  Signs/Symptoms: estimated needs  Interventions: Education   DVT prophylaxis: SCD Code Status: Partial.  No intubation. Family Communication: Updated patient's daughter over the phone, and grandson at bedside. Disposition Plan: Remains inpatient Consultants: GI (signed off), advanced HF, neurology  Procedures:  12/3-RHC with moderately elevated filling pressure with normal cardiac output.   Microbiology summarized: COVID-19 negative  Sch Meds:  Scheduled Meds:  amiodarone  200 mg Oral BID   apixaban  2.5 mg Oral BID   atorvastatin  40 mg Oral q1800   carvedilol  3.125 mg Oral BID WC   feeding supplement (ENSURE ENLIVE)  237 mL Oral Q24H   ferrous gluconate  324 mg Oral BID   [START ON 06/01/2019] furosemide  40 mg Oral Daily   hydrALAZINE  12.5 mg Oral TID   loratadine  10 mg Oral Daily   magnesium oxide  400 mg Oral Daily   mexiletine  200 mg Oral BID   pantoprazole  40 mg Oral Daily   polyethylene glycol  17 g Oral Daily   sodium  chloride flush  3 mL Intravenous Q12H   sodium chloride flush  3 mL Intravenous Q12H   sodium chloride flush  3 mL Intravenous Q12H   sucralfate  1 g Oral TID WC & HS   Continuous Infusions:  sodium chloride     sodium chloride     PRN Meds:.sodium chloride, sodium chloride, acetaminophen, albuterol, promethazine, senna-docusate, sodium chloride, sodium chloride flush, sodium chloride flush  Antimicrobials: Anti-infectives (From admission, onward)   None       I have personally reviewed the following labs and images: CBC: Recent Labs  Lab 05/29/19 0352 05/29/19 1430 05/30/19 0406 05/30/19 0806 05/30/19 0810 05/30/19 0814 05/30/19 1033 05/31/19 0324  WBC 18.6* 19.0* 15.5*  --   --   --  16.0* 15.6*  NEUTROABS  --  14.9*  --   --   --   --   --   --   HGB 8.4* 8.5* 7.8* 8.5* 8.5* 8.5* 8.1* 8.1*  HCT 25.5* 26.2* 23.3* 25.0* 25.0* 25.0* 24.5* 24.8*  MCV 78.9* 79.9* 77.4*  --   --   --  78.8* 78.2*  PLT 342 333 288  --   --   --  311 262   BMP &GFR Recent Labs  Lab 05/26/19 1549 05/28/19 0408 05/28/19 1231 05/29/19 0352 05/30/19 0406 05/30/19 0806 05/30/19 0810 05/30/19 0814 05/30/19 1033 05/31/19 0324  NA 141 141  --  138 138 142 142 143  --  136  K 3.1* 5.3*  --  4.4 4.3 4.4 4.4 4.2  --  4.3  CL 105 108  --  103 105  --   --   --   --  105  CO2 26 25  --  23 25  --   --   --   --  24  GLUCOSE 96 110*  --  95 84  --   --   --   --  98  BUN 30* 32*  --  31* 33*  --   --   --   --  34*  CREATININE 2.08* 2.17*  --  2.03* 1.96*  --   --   --  2.10* 2.07*  CALCIUM 8.6* 8.3*  --  8.3* 8.1*  --   --   --   --  8.2*  MG  --   --  2.1 2.2 2.1  --   --   --   --  2.2   Estimated Creatinine Clearance: 19.2 mL/min (A) (by C-G formula based on SCr of 2.07 mg/dL (H)). Liver & Pancreas: Recent Labs  Lab 05/26/19 1549 05/29/19 1430  AST 85* 85*  ALT 39 38  ALKPHOS 98 100  BILITOT 1.2 1.1  PROT 7.0 7.3  ALBUMIN 2.2* 2.1*   Recent Labs  Lab 05/26/19 1549   LIPASE 28   No results for input(s): AMMONIA in the last 168 hours. Diabetic: No results for input(s): HGBA1C in the last 72 hours. No results for input(s): GLUCAP in the last 168 hours. Cardiac Enzymes: Recent Labs  Lab 05/30/19 0406  CKTOTAL 21*   No results for input(s): PROBNP in the last 8760 hours. Coagulation Profile: No results for input(s): INR, PROTIME in the last 168 hours. Thyroid Function Tests: No results for input(s): TSH, T4TOTAL, FREET4, T3FREE, THYROIDAB in the last 72 hours. Lipid Profile: No results for input(s): CHOL, HDL, LDLCALC, TRIG, CHOLHDL, LDLDIRECT in the last 72 hours. Anemia Panel: Recent Labs    05/28/19 1644  VITAMINB12 755  FOLATE 14.6  FERRITIN 19  TIBC 311  IRON 36  RETICCTPCT 2.4   Urine analysis:    Component Value Date/Time   COLORURINE YELLOW 05/23/2019 0848   APPEARANCEUR HAZY (A) 05/23/2019 0848   LABSPEC 1.013 05/23/2019 0848   PHURINE 6.0 05/23/2019 0848   GLUCOSEU NEGATIVE 05/23/2019 0848   HGBUR LARGE (A) 05/23/2019 0848   BILIRUBINUR NEGATIVE 05/23/2019 0848   KETONESUR NEGATIVE 05/23/2019 0848   PROTEINUR 30 (A) 05/23/2019 0848   UROBILINOGEN 1.0 08/29/2014 2035   NITRITE NEGATIVE 05/23/2019 0848   LEUKOCYTESUR NEGATIVE 05/23/2019 0848   Sepsis Labs: Invalid input(s): PROCALCITONIN, [place]  Microbiology: Recent Results (from the past 240 hour(s))  SARS CORONAVIRUS 2 (TAT 6-24 HRS) Nasopharyngeal Nasopharyngeal Swab     Status: None   Collection Time: 05/26/19 12:32 AM   Specimen: Nasopharyngeal Swab  Result Value Ref Range Status   SARS Coronavirus 2 NEGATIVE NEGATIVE Final    Comment: (NOTE) SARS-CoV-2 target nucleic acids are NOT DETECTED. The SARS-CoV-2 RNA is generally detectable in upper and lower respiratory specimens during the acute phase of infection. Negative results do not preclude SARS-CoV-2 infection, do not rule out co-infections with other pathogens, and should not be used as the sole  basis for treatment or other patient management decisions. Negative results must be combined with clinical observations, patient history, and epidemiological information. The expected result is Negative. Fact Sheet for Patients: SugarRoll.be Fact Sheet for Healthcare Providers: https://www.woods-mathews.com/ This test is not yet approved or cleared by the [place] FDA and  has been authorized for detection and/or diagnosis of SARS-CoV-2 by FDA under an Emergency Use Authorization (EUA). This EUA will remain  in effect (meaning this test can be used) for the duration of the COVID-19 declaration under Section 56 4(b)(1) of the Act, 21 U.S.C. section 360bbb-3(b)(1), unless the authorization is terminated or revoked sooner. Performed at [facility], [place] [address], [place], [place] [address]     Radiology Studies: Ct Head Wo Contrast  Result Date: 05/30/2019 CLINICAL DATA:  Vertigo EXAM: CT HEAD WITHOUT CONTRAST TECHNIQUE: Contiguous axial images were obtained from the base of the skull through the vertex without intravenous contrast. COMPARISON:  04/15/2019 FINDINGS: Brain: No acute intracranial abnormality. Specifically, no hemorrhage, hydrocephalus, mass lesion, acute infarction, or significant intracranial injury. Vascular: No hyperdense vessel or unexpected calcification. Skull: No acute calvarial abnormality. Sinuses/Orbits: Diffuse disease throughout the paranasal sinuses with extensive mucosal thickening. Near complete opacification of the maxillary, frontal and sphenoid sinuses. Mastoid air cells clear. Other: None IMPRESSION: No acute intracranial abnormality. Severe chronic sinusitis. Electronically Signed   By: Rolm Baptise M.D.   On: 05/30/2019 21:40  Ct Head Code Stroke Wo Contrast`  Result Date: 05/31/2019 CLINICAL DATA:  Code stroke.  Left-sided weakness EXAM: CT HEAD WITHOUT CONTRAST TECHNIQUE: Contiguous axial images were  obtained from the base of the skull through the vertex without intravenous contrast. COMPARISON:  CT head 04/15/2019 FINDINGS: Brain: Ventricle size normal. Small hypodensity anterior limb internal capsule on the left unchanged from the prior study. Negative for acute infarct, hemorrhage, mass. No midline shift. Vascular: Negative for hyperdense vessel Skull: Negative Sinuses/Orbits: Extensive mucosal edema throughout the paranasal sinuses. Negative orbit. Other: None ASPECTS ([place] Stroke Program Early CT Score) - Ganglionic level infarction (caudate, lentiform nuclei, internal capsule, insula, M1-M3 cortex): 7 - Supraganglionic infarction (M4-M6 cortex): 3 Total score (0-10 with 10 being normal): 10 IMPRESSION: 1. No acute abnormality and no change from the prior study 2. ASPECTS is 10 Electronically Signed   By: Franchot Gallo M.D.   On: 05/31/2019 15:03      Anothony [address]  If 7PM-7AM, please contact night-coverage [email or web address] Password [email or web address] 05/31/2019, 3:16 PM

## 2019-05-31 NOTE — Consult Note (Signed)
Stroke Neurology Consultation Note  Consult Requested by: Dr. Cyndia Skeeters  Reason for Consult: code stroke  Consult Date: 05/31/19  The history was obtained from the RN and chart.  During history and examination, all items were able to obtain unless otherwise noted.  History of Present Illness:  Stephanie Yates is a 73 y.o. African American female with PMH of CHF/NICM (EF 10 to 15%), VT s/p AICD 2014 and PPM, paroxysmal A. Fib/bradycardia on eliquis, chronic anemia with GIB, CKD-4, HTN, HLD, prolonged QT/torsade and multiple hospitalizations admitted for abdominal pain, SOB and LE edema. She was found to have acute on chronic CHF received lasix, on coreg and hydralazine, gradually doing better. Had Ali Molina yesterday but complained of blurry vision afterwards. 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 She also complained of dizziness 4pm but no focal deficit. Head CT done no acute abnormality. Overnight the dizziness seems getting better with meclizine x 1. eliquis was on hold initially due to anemia and GIB but cleared by GI and she had one dose 10am today.   Around 1:30 pm, pt had acute onset slurry speech, garbled speech and left sided weakness. As per RN, before this, pt was talking and moving on the left. Pt seems also drowsy and sleepy. She does have left ankle and foot pain so she is not moving left LE much as right on the baseline. I met pt in the hallway outside CT. She was drowsy but able to orientated to his name and age, no weakness on the left UE but weaker on the left LE but more related to pain. CT no acute abnormality. CTA head and neck is being performed. Glucose 101  LSN: 1:30 pm tPA Given: No: eliquis 10am today and likely non stroke  Past Medical History:  Diagnosis Date  . Chronic anemia   . Chronic systolic CHF (congestive heart failure) ([place])   . CKD (chronic kidney disease), stage IV ([place])   . Former tobacco use   . History of tobacco abuse   . Hyperlipidemia   .  Hypertension   . Mitral regurgitation   . Nonischemic cardiomyopathy ([place])    a. EF 10-15% on 11/2012 cath with no significant CAD. b. EF 25% in 2019. b. EF 15% in 12/2018  . PAF (paroxysmal atrial fibrillation) ([place])   . Protein calorie malnutrition ([place])   . Quit consuming alcohol in remote past   . Sinus bradycardia 12/21/2012  . Ventricular tachycardia ([place]) 12/14/2012    Past Surgical History:  Procedure Laterality Date  . BIOPSY  01/22/2019   Procedure: BIOPSY;  Surgeon: Wilford Corner, MD;  Location: [place];  Service: Endoscopy;;  . COLONOSCOPY WITH PROPOFOL N/A 03/19/2019   Procedure: COLONOSCOPY WITH PROPOFOL;  Surgeon: Otis Brace, MD;  Location: [place];  Service: Gastroenterology;  Laterality: N/A;  . ESOPHAGOGASTRODUODENOSCOPY (EGD) WITH PROPOFOL N/A 06/18/2017   Procedure: ESOPHAGOGASTRODUODENOSCOPY (EGD) WITH PROPOFOL;  Surgeon: Ronnette Juniper, MD;  Location: [place];  Service: Gastroenterology;  Laterality: N/A;  . ESOPHAGOGASTRODUODENOSCOPY (EGD) WITH PROPOFOL N/A 01/22/2019   Procedure: ESOPHAGOGASTRODUODENOSCOPY (EGD) WITH PROPOFOL;  Surgeon: Wilford Corner, MD;  Location: [place];  Service: Endoscopy;  Laterality: N/A;  . ESOPHAGOGASTRODUODENOSCOPY (EGD) WITH PROPOFOL N/A 03/19/2019   Procedure: ESOPHAGOGASTRODUODENOSCOPY (EGD) WITH PROPOFOL;  Surgeon: Otis Brace, MD;  Location: [facility] ENDOSCOPY;  Service: Gastroenterology;  Laterality: N/A;  . IMPLANTABLE CARDIOVERTER DEFIBRILLATOR IMPLANT  12/19/2012   St. Jude dual-chamber ICD, serial number [id number]  . IMPLANTABLE CARDIOVERTER DEFIBRILLATOR IMPLANT N/A 12/19/2012  Procedure: IMPLANTABLE CARDIOVERTER DEFIBRILLATOR IMPLANT;  Surgeon: Deboraha Sprang, MD;  Location: [facility] CATH LAB;  Service: Cardiovascular;  Laterality: N/A;  . LEFT AND RIGHT HEART CATHETERIZATION WITH CORONARY ANGIOGRAM N/A 12/17/2012   Procedure: LEFT AND RIGHT HEART CATHETERIZATION WITH CORONARY ANGIOGRAM;  Surgeon: Sherren Mocha, MD;  Location: [facility] CATH LAB;  Service: Cardiovascular;  Laterality: N/A;  . POLYPECTOMY  03/19/2019   Procedure: POLYPECTOMY;  Surgeon: Otis Brace, MD;  Location: [facility] ENDOSCOPY;  Service: Gastroenterology;;  . RIGHT HEART CATH N/A 05/30/2019   Procedure: RIGHT HEART CATH;  Surgeon: Jolaine Artist, MD;  Location: [place] CV LAB;  Service: Cardiovascular;  Laterality: N/A;  . SCLEROTHERAPY  01/22/2019   Procedure: SCLEROTHERAPY;  Surgeon: Wilford Corner, MD;  Location: [facility] ENDOSCOPY;  Service: Endoscopy;;    Family History  Problem Relation Age of Onset  . Cancer Mother     Social History:  reports that she has quit smoking. She has never used smokeless tobacco. She reports that she does not drink alcohol or use drugs.  Allergies:  Allergies  Allergen Reactions  . Strawberry Extract Hives, Itching and Swelling         No current facility-administered medications on file prior to encounter.    Current Outpatient Medications on File Prior to Encounter  Medication Sig Dispense Refill  . acetaminophen (TYLENOL) 500 MG tablet Take 1,000 mg by mouth every 6 (six) hours as needed (pain).    Marland Kitchen albuterol (VENTOLIN HFA) 108 (90 Base) MCG/ACT inhaler Inhale 2 puffs into the lungs every 4 (four) hours as needed for wheezing or shortness of breath.    Marland Kitchen amiodarone (PACERONE) 200 MG tablet Take 1 tablet (200 mg total) by mouth 2 (two) times daily. 60 tablet 0  . apixaban (ELIQUIS) 2.5 MG TABS tablet Take 1 tablet (2.5 mg total) by mouth 2 (two) times daily. 60 tablet 0  . atorvastatin (LIPITOR) 40 MG tablet Take 1 tablet (40 mg total) by mouth daily at 6 PM. 30 tablet 0  . carvedilol (COREG) 3.125 MG tablet Take 1 tablet (3.125 mg total) by mouth 2 (two) times daily with a meal. 180 tablet 2  . Cholecalciferol (VITAMIN D-3) 25 MCG (1000 UT) CAPS Take 1,000 Units by mouth at bedtime.     . furosemide (LASIX) 80 MG tablet Take 0.5 tablets (40 mg total) by mouth daily. 45 tablet  0  . hydrALAZINE (APRESOLINE) 25 MG tablet Take 0.5 tablets (12.5 mg total) by mouth 3 (three) times daily. 30 tablet 1  . magnesium oxide (MAGNESIUM-OXIDE) 400 (241.3 Mg) MG tablet Take 1 tablet (400 mg total) by mouth daily. 90 tablet 3  . mexiletine (MEXITIL) 200 MG capsule Take 1 capsule (200 mg total) by mouth 2 (two) times daily. 60 capsule 6  . nitroGLYCERIN (NITROSTAT) 0.4 MG SL tablet Place 1 tablet (0.4 mg total) under the tongue every 5 (five) minutes as needed for chest pain. 30 tablet 12  . ondansetron (ZOFRAN ODT) 4 MG disintegrating tablet Take 1 tablet (4 mg total) by mouth every 8 (eight) hours as needed for nausea or vomiting. 20 tablet 0  . pantoprazole (PROTONIX) 40 MG tablet Take 1 tablet (40 mg total) by mouth daily. 90 tablet 0  . senna-docusate (SENOKOT-S) 8.6-50 MG tablet Take 1 tablet by mouth at bedtime as needed for mild constipation. 30 tablet 0  . sucralfate (CARAFATE) 1 GM/10ML suspension Take 10 mLs (1 g total) by mouth 4 (four) times daily -  with meals  and at bedtime. 420 mL 0  . Ensure (ENSURE) Take 237 mLs by mouth daily.    . ferrous gluconate (FERGON) 324 MG tablet Take 324 mg by mouth 2 (two) times daily.     . polyethylene glycol (MIRALAX / GLYCOLAX) 17 g packet Take 17 g by mouth daily. Can take up to twice a day for constipation. (Patient not taking: Reported on 05/27/2019) 30 each 3    Review of Systems: A full ROS was attempted today and was able to be performed.  Systems assessed include - Constitutional, Eyes, HENT, Respiratory, Cardiovascular, Gastrointestinal, Genitourinary, Integument/breast, Hematologic/lymphatic, Musculoskeletal, Neurological, Behavioral/Psych, Endocrine, Allergic/Immunologic - with pertinent responses as per HPI.  Physical Examination: Temp:  [97.4 F (36.3 C)-98 F (36.7 C)] 97.7 F (36.5 C) (12/04 1420) Pulse Rate:  [60-67] 60 (12/04 0811) Resp:  [14-20] 17 (12/04 1420) BP: (121-141)/(50-87) 135/55 (12/04 1420) SpO2:  [100  %] 100 % (12/04 1420) Weight:  [61.5 kg] 61.5 kg (12/04 0422)  General - well nourished, well developed, drowsy sleepy.    Ophthalmologic - fundi not visualized due to noncooperation.    Cardiovascular - regular rhythm and rate  Neuro - drowsy sleepy but able to open eyes on voice and stimulation. Orientated to her name and age, but not to time. Right eye blind, blinking to visual threat bilaterally, no gaze palsy seen, PERRL. No significant facial droop, tongue protrusion not cooperative. BUE 4/5 against gravity, finger grip symmetrical. RLE 3/5 proximal and distal, LLE 2/5 proximal and distal due to pain at left leg more at left ankle and foot. With passive movement of left leg, pt grimace for pain. Finger to nose no ataxia. Able to name 2/3 and dysarthria on repeat sentences. Stated decreased sensation on the left UE and LE. Gait not tested.   NIH Stroke Scale  Level Of Consciousness 0=Alert; keenly responsive 1=Arouse to minor stimulation 2=Requires repeated stimulation to arouse or movements to pain 3=postures or unresponsive 1  LOC Questions to Month and Age 25=Answers both questions correctly 1=Answers one question correctly or dysarthria/intubated/trauma/language barrier 2=Answers neither question correctly or aphasia 1  LOC Commands      -Open/Close eyes     -Open/close grip     -Pantomime commands if communication barrier 0=Performs both tasks correctly 1=Performs one task correctly 2=Performs neighter task correctly 0  Best Gaze     -Only assess horizontal gaze 0=Normal 1=Partial gaze palsy 2=Forced deviation, or total gaze paresis 0  Visual 0=No visual loss 1=Partial hemianopia 2=Complete hemianopia 3=Bilateral hemianopia (blind including cortical blindness) 0  Facial Palsy     -Use grimace if obtunded 0=Normal symmetrical movement 1=Minor paralysis (asymmetry) 2=Partial paralysis (lower face) 3=Complete paralysis (upper and lower face) 0  Motor  0=No drift for 10/5  seconds 1=Drift, but does not hit bed 2=Some antigravity effort, hits  bed 3=No effort against gravity, limb falls 4=No movement 0=Amputation/joint fusion Right Arm 0     Leg 1    Left Arm 0     Leg 2  Limb Ataxia     - FNT/HTS 0=Absent or does not understand or paralyzed or amputation/joint fusion 1=Present in one limb 2=Present in two limbs 0  Sensory 0=Normal 1=Mild to moderate sensory loss 2=Severe to total sensory loss or coma/unresponsive 1  Best Language 0=No aphasia, normal 1=Mild to moderate aphasia 2=Severe aphasia 3=Mute, global aphasia, or coma/unresponsive 0  Dysarthria 0=Normal 1=Mild to moderate 2=Severe, unintelligible or mute/anarthric 0=intubated/unable to test 1  Extinction/Neglect 0=No abnormality 1=visual/tactile/auditory/spatia/personal inattention/Extinction  to bilateral simultaneous stimulation 2=Profound neglect/extinction more than 1 modality  0  Total   7     Data Reviewed: Ct Abdomen Pelvis Wo Contrast  Result Date: 05/26/2019 CLINICAL DATA:  73 year old female with history of abdominal distension. Diffuse abdominal pain, most severe on the right side. EXAM: CT ABDOMEN AND PELVIS WITHOUT CONTRAST TECHNIQUE: Multidetector CT imaging of the abdomen and pelvis was performed following the standard protocol without IV contrast. COMPARISON:  CT the abdomen and pelvis 03/17/2019. FINDINGS: Lower chest: Mild scarring in the visualized lung bases. Cardiomegaly. Atherosclerotic calcifications in the left anterior descending, left circumflex and right coronary arteries. Pacemaker leads terminating in the right atrium and right ventricular apex. Hepatobiliary: Liver has a slightly shrunken appearance and nodular contour, suggesting underlying cirrhosis. No definite suspicious cystic or solid hepatic lesions are confidently identified on today's noncontrast CT examination. Unenhanced appearance of the gallbladder is normal. Pancreas: No definite pancreatic mass or  peripancreatic fluid collections or inflammatory changes are noted on today's noncontrast CT examination. Spleen: Unremarkable. Adrenals/Urinary Tract: Multiple nonobstructive calculi are noted within the collecting systems of both kidneys, largest of which measures up to 4 mm in the lower pole collecting system of the right kidney. No additional calculi are confidently identified along the course of either ureter or within the lumen of the urinary bladder. No hydroureteronephrosis. Unenhanced appearance of the kidneys is otherwise normal. Left adrenal gland is normal in appearance. 4.5 x 2.1 cm low-attenuation (-27 HU) right adrenal nodule, similar to prior studies, compatible with an adrenal myelolipoma. Unenhanced appearance of the urinary bladder is normal. Stomach/Bowel: Unenhanced appearance of the stomach is normal. No pathologic dilatation of small bowel or colon. Multiple loops of small bowel appear to extend into a large right inguinal hernia. The appendix is not confidently identified and may be surgically absent. Regardless, there are no inflammatory changes noted adjacent to the cecum to suggest the presence of an acute appendicitis at this time. Numerous colonic diverticulae are noted, without surrounding inflammatory changes to suggest an acute diverticulitis at this time. Vascular/Lymphatic: Aortic atherosclerosis. No lymphadenopathy noted in the abdomen or pelvis confidently identified on today's noncontrast CT examination. Reproductive: Uterus is markedly heterogeneous in appearance with multiple coarse calcifications, compatible with a fibroid uterus. Ovaries are unremarkable in appearance. Other: Trace volume of ascites.  No pneumoperitoneum. Musculoskeletal: There are no aggressive appearing lytic or blastic lesions noted in the visualized portions of the skeleton. IMPRESSION: 1. Large right inguinal hernia containing several loops of small bowel, without evidence of bowel incarceration or  obstruction. 2. Numerous small nonobstructive calculi are noted in the collecting systems of both kidneys measuring up to 4 mm in the lower pole. No ureteral stones or findings of urinary tract obstruction are noted at this time. 3. Colonic diverticulosis without evidence of acute diverticulitis at this time. 4. Severe cardiomegaly. 5. Aortic atherosclerosis, in addition to least 3 vessel coronary artery disease. Assessment for potential risk factor modification, dietary therapy or pharmacologic therapy may be warranted, if clinically indicated. 6. Morphologic changes in the liver suggestive of underlying cirrhosis. 7. Additional incidental findings, as above. Electronically Signed   By: Vinnie Langton M.D.   On: 05/26/2019 18:27   Dg Chest 2 View  Result Date: 05/26/2019 CLINICAL DATA:  Patient with right-sided abdominal pain. History of CHF. EXAM: CHEST - 2 VIEW COMPARISON:  Chest radiograph 02/20/2019 FINDINGS: Multi lead AICD device overlies the left hemithorax. Leads are stable in position. Stable cardiomegaly. Tortuosity of the thoracic aorta.  Heterogeneous opacities left lung base. Pulmonary vascular redistribution. Trace bilateral pleural effusions. Thoracic spine degenerative changes. IMPRESSION: Cardiomegaly with pulmonary vascular redistribution and mild interstitial edema. Heterogeneous opacities left lung base may represent atelectasis. Probable trace bilateral pleural effusions. Electronically Signed   By: Lovey Newcomer M.D.   On: 05/26/2019 20:01   Dg Chest 2 View  Result Date: 05/23/2019 CLINICAL DATA:  Shortness of breath EXAM: CHEST - 2 VIEW COMPARISON:  Radiograph 05/05/2019 FINDINGS: Cardiomegaly is similar to slightly increased from comparison AP radiography. Pacer/defibrillator pack overlies the left chest wall leads in the cardiac apex and right atrium. There is a mildly increased interstitial opacity with fissural and septal thickening and indistinct pulmonary vascularity. No  consolidative opacity. Atelectatic changes in the lung bases. No visible pneumothorax or effusion. The osseous structures appear diffusely demineralized which may limit detection of small or nondisplaced fractures. No acute osseous or soft tissue abnormality. Degenerative changes are present in the imaged spine and shoulders. IMPRESSION: 1. Cardiomegaly with mild interstitial pulmonary edema, similar to slightly increased from prior study. 2. Bibasilar atelectasis. No consolidation. Electronically Signed   By: Lovena Le M.D.   On: 05/23/2019 01:17   Dg Chest 2 View  Result Date: 05/05/2019 CLINICAL DATA:  Shortness of breath, worsening lower extremity edema EXAM: CHEST - 2 VIEW COMPARISON:  Radiograph 09/04/2018 FINDINGS: Stable cardiomegaly with a calcified, tortuous aorta. Pacer/defibrillator pack overlies the left chest wall with leads at the right atrium and cardiac apex. Fine reticular changes throughout the lungs are similar to prior with some more streaky opacity in the lung bases. There is mild central venous congestion without frank edema. No pneumothorax or effusion. Degenerative changes are present in the imaged spine and shoulders. IMPRESSION: Coarse interstitial changes, similar to prior with basilar atelectasis. Central venous congestion and cardiomegaly. No convincing evidence of frank edema. Electronically Signed   By: Lovena Le M.D.   On: 05/05/2019 19:41   Ct Head Wo Contrast  Result Date: 05/30/2019 CLINICAL DATA:  Vertigo EXAM: CT HEAD WITHOUT CONTRAST TECHNIQUE: Contiguous axial images were obtained from the base of the skull through the vertex without intravenous contrast. COMPARISON:  04/15/2019 FINDINGS: Brain: No acute intracranial abnormality. Specifically, no hemorrhage, hydrocephalus, mass lesion, acute infarction, or significant intracranial injury. Vascular: No hyperdense vessel or unexpected calcification. Skull: No acute calvarial abnormality. Sinuses/Orbits: Diffuse  disease throughout the paranasal sinuses with extensive mucosal thickening. Near complete opacification of the maxillary, frontal and sphenoid sinuses. Mastoid air cells clear. Other: None IMPRESSION: No acute intracranial abnormality. Severe chronic sinusitis. Electronically Signed   By: Rolm Baptise M.D.   On: 05/30/2019 21:40    Assessment: 73 y.o. female with PMH of CHF/NICM (EF 10 to 15%), VT s/p AICD 2014 and PPM, paroxysmal A. Fib/bradycardia on eliquis, chronic anemia with GIB, CKD-4, HTN, HLD, prolonged QT/torsade and multiple hospitalizations admitted for abdominal pain, SOB and LE edema due to CHF. Complained of dizziness yesterday afternoon. Today had acute onset slurry speech, garbled speech and left sided weakness. BP stable and glucose On my exam, no significant left side weakness but more likely due to pain. CT no acute bleeding. CTA head and neck no LVO by my read. Pt not tPA candidate due to eliquis this am. No IR due to no LVO. Pt presentation concerning for encephalopathy given CKD, leukocytosis, and anemia, but will needs to rule out seizure or stroke. Not able to perform MRI due to PPM, will do 24 hour CT repeat and EEG stat.  Stroke Risk Factors - atrial fibrillation, hyperlipidemia, hypertension and CHF  Plan: - MRI not able to perform due to PPM - repeat CT in 24 hours - EEG stat to rule out seizure - A1C pending - encephalopathy work up - ordered UA, CXR, and ammonia - continue eliquis and statin for now  - neuro check Q2h x 12 and then Q4 - telelmetry - will follow   Thank you for this consultation and allowing [place] to participate in the care of this patient.  Rosalin Hawking, MD PhD Stroke Neurology 05/31/2019 4:26 PM

## 2019-05-31 NOTE — Code Documentation (Signed)
Code Stroke    I received a call from nursing staff with with concerns of calling a Code Stroke. Per nursing staff, patient was LSN [id number] today. Staff said that the patient was getting up and had acute onset of slurred/garbled speech coupled with LT sided weakness. Code Stroke was called and I asked the staff to order CT HEAD STAT and bring the patient to CT. I was in CT with the Stroke Team with another stroke patient. Stroke MD came down and saw the patient while she was waiting for CT scanner to be open. NIH 9.  CT Head - negative, CTA - negative for LVO.   Plan: -- Neuro Checks and VS Q2H x Q12H -- MRI - unable to do b/c of PPM -- EEG  -- Metabolic w/o for encephalopathy.

## 2019-05-31 NOTE — Procedures (Signed)
Patient Name: Stephanie Yates  MRN: [id number]  Epilepsy Attending: Lora Havens  Referring Physician/Provider: Dr Rosalin Hawking Date: 05/31/2019 Duration: 26.98mins  Patient history: 73yo M with sudden onset of slurred speech and left sided weakness. EEG to evaluate for seizure  Level of alertness: awake  AEDs during EEG study: none  Technical aspects: This EEG study was done with scalp electrodes positioned according to the 10-20 International system of electrode placement. Electrical activity was acquired at a sampling rate of 500Hz  and reviewed with a high frequency filter of 70Hz  and a low frequency filter of 1Hz . EEG data were recorded continuously and digitally stored.   DESCRIPTION: No clear posterior dominant rhythm was seen. EEG showed continuous generalized polymorphic 3-6hz  theta-delta slowing. At times, generalized triphasic waves were also noted. Hyperventilation and photic stimulation were not performed.  ABNORMALITY - Continuous slow, generalized - Triphasic waves, generalized  IMPRESSION: This study is suggestive of moderate diffuse encephalopathy, non specific to etiology, could be secondary to toxic-metabolic causes. No seizures or definite epileptiform discharges were seen throughout the recording.  [name]

## 2019-06-01 ENCOUNTER — Inpatient Hospital Stay (HOSPITAL_COMMUNITY): Payer: Medicare Other

## 2019-06-01 DIAGNOSIS — K7200 Acute and subacute hepatic failure without coma: Secondary | ICD-10-CM

## 2019-06-01 LAB — BASIC METABOLIC PANEL
Anion gap: 10 (ref 5–15)
BUN: 37 mg/dL — ABNORMAL HIGH (ref 8–23)
CO2: 23 mmol/L (ref 22–32)
Calcium: 8.1 mg/dL — ABNORMAL LOW (ref 8.9–10.3)
Chloride: 105 mmol/L (ref 98–111)
Creatinine, Ser: 2.04 mg/dL — ABNORMAL HIGH (ref 0.44–1.00)
GFR calc Af Amer: 27 mL/min — ABNORMAL LOW (ref >60)
GFR calc non Af Amer: 23 mL/min — ABNORMAL LOW (ref >60)
Glucose, Bld: 74 mg/dL (ref 70–99)
Potassium: 5 mmol/L (ref 3.5–5.1)
Sodium: 138 mmol/L (ref 135–145)

## 2019-06-01 LAB — HEPATIC FUNCTION PANEL
ALT: 39 U/L (ref 0–44)
AST: 92 U/L — ABNORMAL HIGH (ref 15–41)
Albumin: 2 g/dL — ABNORMAL LOW (ref 3.5–5.0)
Alkaline Phosphatase: 105 U/L (ref 38–126)
Bilirubin, Direct: 0.5 mg/dL — ABNORMAL HIGH (ref 0.0–0.2)
Indirect Bilirubin: 0.7 mg/dL (ref 0.3–0.9)
Total Bilirubin: 1.2 mg/dL (ref 0.3–1.2)
Total Protein: 6.5 g/dL (ref 6.5–8.1)

## 2019-06-01 LAB — CBC
HCT: 25.5 % — ABNORMAL LOW (ref 36.0–46.0)
Hemoglobin: 8.3 g/dL — ABNORMAL LOW (ref 12.0–15.0)
MCH: 25.6 pg — ABNORMAL LOW (ref 26.0–34.0)
MCHC: 32.5 g/dL (ref 30.0–36.0)
MCV: 78.7 fL — ABNORMAL LOW (ref 80.0–100.0)
Platelets: 287 10*3/uL (ref 150–400)
RBC: 3.24 MIL/uL — ABNORMAL LOW (ref 3.87–5.11)
RDW: 15.7 % — ABNORMAL HIGH (ref 11.5–15.5)
WBC: 17.4 10*3/uL — ABNORMAL HIGH (ref 4.0–10.5)
nRBC: 0 % (ref 0.0–0.2)

## 2019-06-01 LAB — URINALYSIS, COMPLETE (UACMP) WITH MICROSCOPIC
Bilirubin Urine: NEGATIVE
Glucose, UA: NEGATIVE mg/dL
Ketones, ur: NEGATIVE mg/dL
Nitrite: NEGATIVE
Protein, ur: 30 mg/dL — AB
Specific Gravity, Urine: 1.02 (ref 1.005–1.030)
pH: 5 (ref 5.0–8.0)

## 2019-06-01 LAB — GLUCOSE, CAPILLARY
Glucose-Capillary: 84 mg/dL (ref 70–99)
Glucose-Capillary: 89 mg/dL (ref 70–99)
Glucose-Capillary: 101 mg/dL — ABNORMAL HIGH (ref 70–99)
Glucose-Capillary: 99 mg/dL (ref 70–99)

## 2019-06-01 LAB — AMMONIA: Ammonia: 79 umol/L — ABNORMAL HIGH (ref 9–35)

## 2019-06-01 LAB — MAGNESIUM: Magnesium: 2.3 mg/dL (ref 1.7–2.4)

## 2019-06-01 MED ORDER — LACTULOSE 10 GM/15ML PO SOLN
30.0000 g | Freq: Three times a day (TID) | ORAL | Status: DC
  Administered 2019-06-01 (×2): 30 g via ORAL
  Filled 2019-06-01 (×2): qty 45

## 2019-06-01 NOTE — Progress Notes (Signed)
Neurology Progress Note   S:// Patient seen and examined. She was in the CT scanner receiving CT of her abdomen-I ordered CT head repeat to be done now as well.   O:// Current vital signs: BP 128/71   Pulse 63   Temp (!) 97.5 F (36.4 C) (Oral)   Resp 17   Ht 5' (1.524 m)   Wt 61.4 kg   SpO2 100%   BMI 26.42 kg/m  Vital signs in last 24 hours: Temp:  [97.5 F (36.4 C)-98.6 F (37 C)] 97.5 F (36.4 C) (12/05 0745) Pulse Rate:  [60-68] 63 (12/05 0913) Resp:  [15-18] 17 (12/05 0745) BP: (90-161)/(44-71) 128/71 (12/05 0913) SpO2:  [94 %-100 %] 100 % (12/05 0339) Weight:  [61.4 kg] 61.4 kg (12/05 0339) General: Drowsy, opens eyes to voice, responds to questions HEENT: Cephalic atraumatic CVS: Regular rate rhythm Respiratory: Breathing normally and saturating well on room air Abdomen: Nondistended nontender Neurological exam She is awake alert oriented to self. Could tell me her date of birth correctly but got her age wrong. Could not tell me where she is and what month we are in. Speech is hesitant but not truly dysarthric. Very poor attention concentration No obvious evidence of aphasia but that is difficult to assess due to her poor attention concentration. Cranial: Pupils equal round react light, extract movements intact, visual fields appear full to threat, face appears symmetric. Motor exam: Both upper extremities antigravity with significant asterixis on outstretched arms. Sensory exam: Slightly weaker on the left hip flexion but generally grossly weak in both lower extremities with barely antigravity right lower extremity and 2/5 left lower extremity. Sensory exam: Intact No discernible ataxia but significant asterixis as above.  Medications  Current Facility-Administered Medications:  .  0.9 %  sodium chloride infusion, 250 mL, Intravenous, PRN, Bensimhon, Daniel R, MD .  0.9 %  sodium chloride infusion, 250 mL, Intravenous, PRN, Bensimhon, Shaune Pascal, MD .   acetaminophen (TYLENOL) tablet 650 mg, 650 mg, Oral, Q4H PRN, Bensimhon, Shaune Pascal, MD, 650 mg at 05/30/19 2252 .  albuterol (PROVENTIL) (2.5 MG/3ML) 0.083% nebulizer solution 2.5 mg, 2.5 mg, Inhalation, Q4H PRN, Bensimhon, Shaune Pascal, MD, 2.5 mg at 05/27/19 2259 .  amiodarone (PACERONE) tablet 200 mg, 200 mg, Oral, BID, Bensimhon, Shaune Pascal, MD, 200 mg at 06/01/19 0915 .  apixaban (ELIQUIS) tablet 2.5 mg, 2.5 mg, Oral, BID, Bensimhon, Shaune Pascal, MD, 2.5 mg at 06/01/19 0913 .  atorvastatin (LIPITOR) tablet 40 mg, 40 mg, Oral, q1800, Bensimhon, Shaune Pascal, MD, 40 mg at 05/31/19 1713 .  carvedilol (COREG) tablet 3.125 mg, 3.125 mg, Oral, BID WC, Bensimhon, Shaune Pascal, MD, 3.125 mg at 06/01/19 0915 .  feeding supplement (ENSURE ENLIVE) (ENSURE ENLIVE) liquid 237 mL, 237 mL, Oral, Q24H, Bensimhon, Shaune Pascal, MD, 237 mL at 05/31/19 1338 .  ferrous gluconate (FERGON) tablet 324 mg, 324 mg, Oral, BID, Bensimhon, Shaune Pascal, MD, 324 mg at 06/01/19 4010 .  furosemide (LASIX) tablet 40 mg, 40 mg, Oral, Daily, Clegg, Amy D, NP, 40 mg at 06/01/19 0915 .  hydrALAZINE (APRESOLINE) tablet 12.5 mg, 12.5 mg, Oral, TID, Bensimhon, Shaune Pascal, MD, 12.5 mg at 06/01/19 0915 .  insulin aspart (novoLOG) injection 0-6 Units, 0-6 Units, Subcutaneous, TID WC, Xu, Jindong, MD .  lactulose (CHRONULAC) 10 GM/15ML solution 20 g, 20 g, Oral, TID, Cyndia Skeeters, Taye T, MD, 20 g at 06/01/19 0913 .  loratadine (CLARITIN) tablet 10 mg, 10 mg, Oral, Daily, Gonfa, Taye T, MD, 10 mg  at 06/01/19 0915 .  magnesium oxide (MAG-OX) tablet 400 mg, 400 mg, Oral, Daily, Bensimhon, Shaune Pascal, MD, 400 mg at 06/01/19 0915 .  mexiletine (MEXITIL) capsule 200 mg, 200 mg, Oral, BID, Bensimhon, Shaune Pascal, MD, 200 mg at 06/01/19 0916 .  pantoprazole (PROTONIX) EC tablet 40 mg, 40 mg, Oral, Daily, Bensimhon, Shaune Pascal, MD, 40 mg at 06/01/19 0914 .  polyethylene glycol (MIRALAX / GLYCOLAX) packet 17 g, 17 g, Oral, Daily, Bensimhon, Shaune Pascal, MD, 17 g at 05/31/19 1010 .   promethazine (PHENERGAN) injection 12.5 mg, 12.5 mg, Intravenous, Q8H PRN, Bensimhon, Shaune Pascal, MD, 12.5 mg at 05/29/19 1328 .  senna-docusate (Senokot-S) tablet 1 tablet, 1 tablet, Oral, BID PRN, Bensimhon, Shaune Pascal, MD .  sodium chloride (OCEAN) 0.65 % nasal spray 1 spray, 1 spray, Each Nare, PRN, Gonfa, Taye T, MD .  sodium chloride flush (NS) 0.9 % injection 3 mL, 3 mL, Intravenous, Q12H, Bensimhon, Shaune Pascal, MD, 3 mL at 05/31/19 2232 .  sodium chloride flush (NS) 0.9 % injection 3 mL, 3 mL, Intravenous, PRN, Bensimhon, Shaune Pascal, MD .  sodium chloride flush (NS) 0.9 % injection 3 mL, 3 mL, Intravenous, Q12H, Bensimhon, Shaune Pascal, MD, 3 mL at 06/01/19 0925 .  sodium chloride flush (NS) 0.9 % injection 3 mL, 3 mL, Intravenous, Q12H, Bensimhon, Shaune Pascal, MD, 3 mL at 06/01/19 0926 .  sodium chloride flush (NS) 0.9 % injection 3 mL, 3 mL, Intravenous, PRN, Bensimhon, Shaune Pascal, MD .  sucralfate (CARAFATE) 1 GM/10ML suspension 1 g, 1 g, Oral, TID WC & HS, Bensimhon, Shaune Pascal, MD, 1 g at 06/01/19 0914 Labs CBC    Component Value Date/Time   WBC 17.4 (H) 06/01/2019 0317   RBC 3.24 (L) 06/01/2019 0317   HGB 8.3 (L) 06/01/2019 0317   HGB 11.4 12/19/2018 0906   HCT 25.5 (L) 06/01/2019 0317   HCT 36.4 12/19/2018 0906   PLT 287 06/01/2019 0317   PLT 226 12/19/2018 0906   MCV 78.7 (L) 06/01/2019 0317   MCV 74 (L) 12/19/2018 0906   MCH 25.6 (L) 06/01/2019 0317   MCHC 32.5 06/01/2019 0317   RDW 15.7 (H) 06/01/2019 0317   RDW 16.6 (H) 12/19/2018 0906   LYMPHSABS 1.7 05/29/2019 1430   LYMPHSABS 1.7 08/30/2017 1600   MONOABS 2.0 (H) 05/29/2019 1430   EOSABS 0.2 05/29/2019 1430   EOSABS 0.2 08/30/2017 1600   BASOSABS 0.1 05/29/2019 1430   BASOSABS 0.0 08/30/2017 1600    CMP     Component Value Date/Time   NA 138 06/01/2019 0317   NA 135 12/19/2018 0906   K 5.0 06/01/2019 0317   CL 105 06/01/2019 0317   CO2 23 06/01/2019 0317   GLUCOSE 74 06/01/2019 0317   BUN 37 (H) 06/01/2019 0317   BUN  27 12/19/2018 0906   CREATININE 2.04 (H) 06/01/2019 0317   CREATININE 1.60 (H) 03/16/2016 1457   CALCIUM 8.1 (L) 06/01/2019 0317   PROT 7.3 05/29/2019 1430   PROT 7.5 12/19/2018 0906   ALBUMIN 2.1 (L) 05/29/2019 1430   ALBUMIN 3.8 12/19/2018 0906   AST 85 (H) 05/29/2019 1430   ALT 38 05/29/2019 1430   ALKPHOS 100 05/29/2019 1430   BILITOT 1.1 05/29/2019 1430   BILITOT 0.9 12/19/2018 0906   GFRNONAA 23 (L) 06/01/2019 0317   GFRAA 27 (L) 06/01/2019 0317   Ammonia level 105  EEG with generalized slowing and triphasics.  Imaging I have reviewed images in epic and the results  pertinent to this consultation are: CT head with no acute changes yesterday. Repeat CT head-completed.  No evidence of large evolving stroke. Unchanged from priro CT head. Formal read pending. Chest x-ray 05/31/2019 unchanged from prior comparison of 05/26/2019.   Impression: Multifactorial toxic metabolic encephalopathy Hepatic encephalopathy  Recommendations: Repeat ammonia Continue correcting ammonia with lactulose Minimize sedating medications Continue anticoagulant and statin for now Urinalysis recommended in yesterday's consult not obtained yet.  Obtain urinalysis when possible.  Treat if there is evidence of UTI. Management of multiple medical comorbidities-CHF, CKD, hypertension, hyperlipidemia per primary team as you are. Neurology will be available as needed Please call with questions  -- Amie Portland, MD [facility] Pager: [phone] If 7pm to 7am, please call on call as listed on [facility]ION.

## 2019-06-01 NOTE — Progress Notes (Signed)
Tried in/out catheter x2 to obtain urine culture. Unable to advance catheter and no urine output noted.  Bladder scan showed 241 mL.  Dr. Cyndia Skeeters made aware.  Idolina Primer, RN

## 2019-06-01 NOTE — Progress Notes (Addendum)
PROGRESS NOTE  Stephanie Yates LTJ:[id number] DOB: [birth year]   PCP: Josetta Huddle, MD  Patient is from: Home  DOA: 05/26/2019 LOS: 6  Brief Narrative / Interim history: 73 year old female with history of systolic CHF/NICM (EF 10 to 15%), VT s/p AICD 2014 and biventricular PPM, paroxysmal A. fib/bradycardia, chronic anemia, CKD-4, HTN, HLD, moderate protein calorie malnutrition and GIB, prolonged QT/torsade and multiple hospitalizations presented to ED 11/38/20 with abdominal pain, nausea, vomiting, shortness of breath and lower extremity edema.   In ED, HDS.  W 16.  Hgb 7.7 (baseline 8-9).  Creatinine 2.0 (about baseline).  BNP 1587 (slightly higher than baseline).  CXR concerning for CHF.  CT abdomen and pelvis with right large inguinal hernia without obstruction or incarceration.  FOBT positive.  She was admitted for CHF exacerbation and GI bleed.  Started on IV diuretics.  Will give GI consulted.  Patient was evaluated by Dr. Ethlyn Daniels from [facility].  GI did not feel EGD or colonoscopy is necessary as she had several of them in the last 2 months.  GI suggested intermittent blood and iron transfusion unless rampant overt bleeding in which case she would need transfer to tertiary center given his significant cardiac history.  Advanced heart failure team consulted 05/28/2019.  Started on higher dose IV Lasix.  Patient had Plum on 05/30/2019 that revealed moderately elevated filling pressure with normal cardiac output.   On 12/3, patient developed dizziness that she described as tenderness moving up and down.  CT head without contrast obtained but no acute finding. Neuro exam although limited was not impressive except for generalized weakness.  She was given meclizine x1.  Of note, patient has history of dizziness for the last 3 months per patient's daughter.  On 05/31/2019, had some ataxia, left-sided arm weakness and dysarthria when evaluated by PT.  Code stroke activated.  Stat CT head, CTA head and  neck obtained but not significant finding.  EEG without epileptiform discharge.  Ammonia elevated to 105.  Started on lactulose.  Subjective: No major events overnight of this morning.  She is more alert today.  Complains of left heel pain.  Denies chest pain, dyspnea, dizziness, headache or vision change.  Objective: Vitals:   06/01/19 0059 06/01/19 0339 06/01/19 0745 06/01/19 0913  BP: (!) 125/54 (!) 161/44 (!) 138/55 128/71  Pulse: 61 68  63  Resp: 15 18 17    Temp: 98.6 F (37 C) 98.4 F (36.9 C) (!) 97.5 F (36.4 C)   TempSrc:  Oral Oral   SpO2: 100% 100%    Weight:  61.4 kg    Height:        Intake/Output Summary (Last 24 hours) at 06/01/2019 1112 Last data filed at 06/01/2019 0926 Gross per 24 hour  Intake 246 ml  Output --  Net 246 ml   Filed Weights   05/30/19 0245 05/31/19 0422 06/01/19 0339  Weight: 61.9 kg 61.5 kg 61.4 kg    Examination:  GENERAL: No acute distress.  Lying comfortably. HEENT: MMM.  Vision and hearing grossly intact.  Sclera anicteric. NECK: Supple.  No apparent JVD.  RESP:  No IWOB.  Fair air movement bilaterally. CVS:  RRR. Heart sounds normal.  ABD/GI/GU: Bowel sounds present.  Diffuse tenderness to palpation. MSK/EXT:  Moves extremities.  Some ataxia/jerking in her arms SKIN: Stage I pressure injury of left heel NEURO: Awake and oriented x4.  No apparent focal neuro deficit but some jerking and ataxia noted in both arms.  Motor 3/5 in  both upper extremities.  2+/5 in both lower extremities. PSYCH: Calm. Normal affect.   This exam after she returned from head CT is similar to her exam from earlier this morning.  Assessment & Plan: Stroke-like symptoms/hepatic encephalopathy:  Ammonia elevated to 105.  CT head, and CTA head and neck without acute finding.  Neuro exam not convincing for CVA.  Awake but slow.  She is oriented x4 -Continue lactulose 3 times daily for goal of 2-3 BMs a day -Repeat ammonia and CT head.  AICD not compatible with  MRI.  Check TSH -Check CT abdomen and pelvis without contrast  Acute on chronic systolic CHF/NICM: NYHA IIIb-IV.  most recent echo with EF of 10 to 15%.  Per advanced HF team, patient declined palliative care/hospice in the past.  Patient has cardinal symptoms including dyspnea, orthopnea and PND.  I&O incomplete.  Renal function stable. -RHC on 12/3 with moderately elevated filling pressure with normal cardiac output.  -Advanced HF managing-on p.o. Lasix 40 mg daily today -Limited on options for GDMT due to CKD -On low-dose Coreg and low-dose hydralazine -Monitor fluid status, renal function and electrolytes -Sodium and fluid restriction -May have to hold diuretics and cardiac meds until we exclude CVA.  History of recurrent VT s/p AICD -Continue telemetry monitoring -Optimize K and Mg  Paroxysmal A. Fib/bradycardia -Coreg as above -On Eliquis for anticoagulation.  Acute on chronic blood loss anemia in patient with GI bleed and chronic kidney disease Acute on chronic GI bleed-FOBT positive.  No scope per GI.  Consider tertiary center if significant bleeding.  GI signed off.  Anemia panel normal. -Hgb 8-9 (baseline)> 7.7 (admit)>> 7.7> 7.8> 8.3 -Continue PPI -Monitor H&H  CKD-4: Stable. -Continue monitoring  Leukocytosis: No obvious source of infection.  She has abdominal tenderness on exam.  Initial CT abdomen and pelvis reassuring.  CT head with chronic sinusitis.  CXR consistent with CHF -Repeat CT abdomen and pelvis without contrast -Follow urinalysis and urine culture  Abdominal pain/tenderness: could be due to CHF, large right inguinal hernia and constipation.  Initial CT A/P reassuring.  -Repeat CT abdomen and pelvis as above.  Essential hypertension: Normotensive. -Cardiac meds and diuretics as above  Mild hyperkalemia: Resolved. -Continue monitoring  Constipation: Disimpacted 12/2. -Started on lactulose   Pressure Injury 05/29/19 Buttocks Left;Right Stage I -   Intact skin with non-blanchable redness of a localized area usually over a bony prominence. (Active)  05/29/19 1105  Location: Buttocks  Location Orientation: Left;Right  Staging: Stage I -  Intact skin with non-blanchable redness of a localized area usually over a bony prominence.  Wound Description (Comments):   Present on Admission: Yes     Pressure Injury 05/31/19 Buttocks Left Stage II -  Partial thickness loss of dermis presenting as a shallow open ulcer with a red, pink wound bed without slough. (Active)  05/31/19 0800  Location: Buttocks  Location Orientation: Left  Staging: Stage II -  Partial thickness loss of dermis presenting as a shallow open ulcer with a red, pink wound bed without slough.  Wound Description (Comments):   Present on Admission:     Moderate protein calorie malnutrition: -Consult dietitian Nutrition Problem: Increased nutrient needs Etiology: chronic illness  Signs/Symptoms: estimated needs  Interventions: Education   DVT prophylaxis: SCD Code Status: Partial.  No intubation. Family Communication: Updated patient's daughter over the phone, and grandson at bedside on 12/4. Disposition Plan: Remains inpatient for CHF and hepatic encephalopathy Consultants: GI (signed off), advanced HF, neurology  Procedures:  12/3-RHC with moderately elevated filling pressure with normal cardiac output.   Microbiology summarized: COVID-19 negative  Sch Meds:  Scheduled Meds:  amiodarone  200 mg Oral BID   apixaban  2.5 mg Oral BID   atorvastatin  40 mg Oral q1800   carvedilol  3.125 mg Oral BID WC   feeding supplement (ENSURE ENLIVE)  237 mL Oral Q24H   ferrous gluconate  324 mg Oral BID   furosemide  40 mg Oral Daily   hydrALAZINE  12.5 mg Oral TID   insulin aspart  0-6 Units Subcutaneous TID WC   lactulose  30 g Oral TID   loratadine  10 mg Oral Daily   magnesium oxide  400 mg Oral Daily   mexiletine  200 mg Oral BID   pantoprazole  40 mg  Oral Daily   sodium chloride flush  3 mL Intravenous Q12H   sodium chloride flush  3 mL Intravenous Q12H   sodium chloride flush  3 mL Intravenous Q12H   sucralfate  1 g Oral TID WC & HS   Continuous Infusions:  sodium chloride     sodium chloride     PRN Meds:.sodium chloride, sodium chloride, acetaminophen, albuterol, promethazine, sodium chloride, sodium chloride flush, sodium chloride flush  Antimicrobials: Anti-infectives (From admission, onward)   None       I have personally reviewed the following labs and images: CBC: Recent Labs  Lab 05/29/19 1430 05/30/19 0406  05/30/19 0810 05/30/19 0814 05/30/19 1033 05/31/19 0324 06/01/19 0317  WBC 19.0* 15.5*  --   --   --  16.0* 15.6* 17.4*  NEUTROABS 14.9*  --   --   --   --   --   --   --   HGB 8.5* 7.8*   < > 8.5* 8.5* 8.1* 8.1* 8.3*  HCT 26.2* 23.3*   < > 25.0* 25.0* 24.5* 24.8* 25.5*  MCV 79.9* 77.4*  --   --   --  78.8* 78.2* 78.7*  PLT 333 288  --   --   --  311 262 287   < > = values in this interval not displayed.   BMP &GFR Recent Labs  Lab 05/28/19 0408 05/28/19 1231 05/29/19 0352 05/30/19 0406 05/30/19 0806 05/30/19 0810 05/30/19 0814 05/30/19 1033 05/31/19 0324 06/01/19 0317  NA 141  --  138 138 142 142 143  --  136 138  K 5.3*  --  4.4 4.3 4.4 4.4 4.2  --  4.3 5.0  CL 108  --  103 105  --   --   --   --  105 105  CO2 25  --  23 25  --   --   --   --  24 23  GLUCOSE 110*  --  95 84  --   --   --   --  98 74  BUN 32*  --  31* 33*  --   --   --   --  34* 37*  CREATININE 2.17*  --  2.03* 1.96*  --   --   --  2.10* 2.07* 2.04*  CALCIUM 8.3*  --  8.3* 8.1*  --   --   --   --  8.2* 8.1*  MG  --  2.1 2.2 2.1  --   --   --   --  2.2 2.3   Estimated Creatinine Clearance: 19.5 mL/min (A) (by C-G formula based on SCr of 2.04 mg/dL (  H)). Liver & Pancreas: Recent Labs  Lab 05/26/19 1549 05/29/19 1430  AST 85* 85*  ALT 39 38  ALKPHOS 98 100  BILITOT 1.2 1.1  PROT 7.0 7.3  ALBUMIN 2.2* 2.1*    Recent Labs  Lab 05/26/19 1549  LIPASE 28   Recent Labs  Lab 05/31/19 1644  AMMONIA 105*   Diabetic: Recent Labs    05/31/19 0324  HGBA1C 5.6   Recent Labs  Lab 05/31/19 1616 06/01/19 0859  GLUCAP 101* 84   Cardiac Enzymes: Recent Labs  Lab 05/30/19 0406  CKTOTAL 21*   No results for input(s): PROBNP in the last 8760 hours. Coagulation Profile: No results for input(s): INR, PROTIME in the last 168 hours. Thyroid Function Tests: No results for input(s): TSH, T4TOTAL, FREET4, T3FREE, THYROIDAB in the last 72 hours. Lipid Profile: No results for input(s): CHOL, HDL, LDLCALC, TRIG, CHOLHDL, LDLDIRECT in the last 72 hours. Anemia Panel: No results for input(s): VITAMINB12, FOLATE, FERRITIN, TIBC, IRON, RETICCTPCT in the last 72 hours. Urine analysis:    Component Value Date/Time   COLORURINE YELLOW 05/23/2019 0848   APPEARANCEUR HAZY (A) 05/23/2019 0848   LABSPEC 1.013 05/23/2019 0848   PHURINE 6.0 05/23/2019 0848   GLUCOSEU NEGATIVE 05/23/2019 0848   HGBUR LARGE (A) 05/23/2019 0848   BILIRUBINUR NEGATIVE 05/23/2019 0848   KETONESUR NEGATIVE 05/23/2019 0848   PROTEINUR 30 (A) 05/23/2019 0848   UROBILINOGEN 1.0 08/29/2014 2035   NITRITE NEGATIVE 05/23/2019 0848   LEUKOCYTESUR NEGATIVE 05/23/2019 0848   Sepsis Labs: Invalid input(s): PROCALCITONIN, [place]  Microbiology: Recent Results (from the past 240 hour(s))  SARS CORONAVIRUS 2 (TAT 6-24 HRS) Nasopharyngeal Nasopharyngeal Swab     Status: None   Collection Time: 05/26/19 12:32 AM   Specimen: Nasopharyngeal Swab  Result Value Ref Range Status   SARS Coronavirus 2 NEGATIVE NEGATIVE Final    Comment: (NOTE) SARS-CoV-2 target nucleic acids are NOT DETECTED. The SARS-CoV-2 RNA is generally detectable in upper and lower respiratory specimens during the acute phase of infection. Negative results do not preclude SARS-CoV-2 infection, do not rule out co-infections with other pathogens, and should not  be used as the sole basis for treatment or other patient management decisions. Negative results must be combined with clinical observations, patient history, and epidemiological information. The expected result is Negative. Fact Sheet for Patients: SugarRoll.be Fact Sheet for Healthcare Providers: https://www.woods-mathews.com/ This test is not yet approved or cleared by the [place] FDA and  has been authorized for detection and/or diagnosis of SARS-CoV-2 by FDA under an Emergency Use Authorization (EUA). This EUA will remain  in effect (meaning this test can be used) for the duration of the COVID-19 declaration under Section 56 4(b)(1) of the Act, 21 U.S.C. section 360bbb-3(b)(1), unless the authorization is terminated or revoked sooner. Performed at [facility], [place] [address], [place], [place] [address]     Radiology Studies: Ct Angio Head W Or Wo Contrast  Result Date: 05/31/2019 CLINICAL DATA:  Left-sided weakness EXAM: CT ANGIOGRAPHY HEAD AND NECK TECHNIQUE: Multidetector CT imaging of the head and neck was performed using the standard protocol during bolus administration of intravenous contrast. Multiplanar CT image reconstructions and MIPs were obtained to evaluate the vascular anatomy. Carotid stenosis measurements (when applicable) are obtained utilizing NASCET criteria, using the distal internal carotid diameter as the denominator. CONTRAST:  161mL OMNIPAQUE IOHEXOL 350 MG/ML SOLN COMPARISON:  None. FINDINGS: CTA NECK FINDINGS Aortic arch: Great vessel origins are patent. Right carotid system: Common, internal,  and external carotid arteries are patent. There is mild calcified plaque along the common carotid. Mild calcified plaque is present at the ICA origin, noting streak artifact through this region. There is no hemodynamically significant stenosis or evidence of dissection. Left carotid system: Common, internal, and external  carotid arteries are patent. Mild calcified plaque is present along the common carotid. There is primarily calcified plaque at the bifurcation and ICA origin causing minimal stenosis, noting streak artifact through this region. There is no hemodynamically significant stenosis or evidence of dissection. Vertebral arteries: Dominant right vertebral artery is patent. Calcified plaque at its origin results in mild stenosis. Congenitally small caliber left vertebral artery is patent. Skeleton: Advanced degenerative changes of the cervical spine particularly from C4-C5 through C6-C7. Other neck: No neck mass or adenopathy. Diffuse paranasal sinus opacification with areas of hyperdensity likely reflecting inspissation with fungal colonization not excluded. Upper chest: No apical lung mass. Review of the MIP images confirms the above findings CTA HEAD FINDINGS Anterior circulation: Intracranial internal carotid arteries are patent with calcified plaque along cavernous and paraclinoid portions causing mild stenosis. Anterior and middle cerebral arteries are patent. Posterior circulation: Intracranial vertebral arteries are patent. The left vertebral artery terminates as a PICA. Basilar artery is patent. Posterior cerebral arteries are patent. Posterior communicating arteries are present. Venous sinuses: Patent as permitted by contrast timing Anatomic variants: Fetal origin of the right PCA. Review of the MIP images confirms the above findings IMPRESSION: No proximal intracranial vessel occlusion. No occlusion or hemodynamically significant stenosis in the neck. Electronically Signed   By: Macy Mis M.D.   On: 05/31/2019 15:56   Ct Angio Neck W Or Wo Contrast  Result Date: 05/31/2019 CLINICAL DATA:  Left-sided weakness EXAM: CT ANGIOGRAPHY HEAD AND NECK TECHNIQUE: Multidetector CT imaging of the head and neck was performed using the standard protocol during bolus administration of intravenous contrast. Multiplanar CT  image reconstructions and MIPs were obtained to evaluate the vascular anatomy. Carotid stenosis measurements (when applicable) are obtained utilizing NASCET criteria, using the distal internal carotid diameter as the denominator. CONTRAST:  152mL OMNIPAQUE IOHEXOL 350 MG/ML SOLN COMPARISON:  None. FINDINGS: CTA NECK FINDINGS Aortic arch: Great vessel origins are patent. Right carotid system: Common, internal, and external carotid arteries are patent. There is mild calcified plaque along the common carotid. Mild calcified plaque is present at the ICA origin, noting streak artifact through this region. There is no hemodynamically significant stenosis or evidence of dissection. Left carotid system: Common, internal, and external carotid arteries are patent. Mild calcified plaque is present along the common carotid. There is primarily calcified plaque at the bifurcation and ICA origin causing minimal stenosis, noting streak artifact through this region. There is no hemodynamically significant stenosis or evidence of dissection. Vertebral arteries: Dominant right vertebral artery is patent. Calcified plaque at its origin results in mild stenosis. Congenitally small caliber left vertebral artery is patent. Skeleton: Advanced degenerative changes of the cervical spine particularly from C4-C5 through C6-C7. Other neck: No neck mass or adenopathy. Diffuse paranasal sinus opacification with areas of hyperdensity likely reflecting inspissation with fungal colonization not excluded. Upper chest: No apical lung mass. Review of the MIP images confirms the above findings CTA HEAD FINDINGS Anterior circulation: Intracranial internal carotid arteries are patent with calcified plaque along cavernous and paraclinoid portions causing mild stenosis. Anterior and middle cerebral arteries are patent. Posterior circulation: Intracranial vertebral arteries are patent. The left vertebral artery terminates as a PICA. Basilar artery is patent.  Posterior  cerebral arteries are patent. Posterior communicating arteries are present. Venous sinuses: Patent as permitted by contrast timing Anatomic variants: Fetal origin of the right PCA. Review of the MIP images confirms the above findings IMPRESSION: No proximal intracranial vessel occlusion. No occlusion or hemodynamically significant stenosis in the neck. Electronically Signed   By: Macy Mis M.D.   On: 05/31/2019 15:56   Dg Chest Port 1 View  Result Date: 05/31/2019 CLINICAL DATA:  Cough.  CHF. EXAM: PORTABLE CHEST 1 VIEW COMPARISON:  05/26/2019 FINDINGS: There is a left chest wall ICD with leads in the right atrial appendage and right ventricle. Cardiac enlargement. Aortic atherosclerosis. Pulmonary vascular congestion. No pleural effusion. No airspace opacities. IMPRESSION: 1. Similar appearance of cardiac enlargement and pulmonary vascular congestion. Electronically Signed   By: Kerby Moors M.D.   On: 05/31/2019 19:42   Ct Head Code Stroke Wo Contrast`  Result Date: 05/31/2019 CLINICAL DATA:  Code stroke.  Left-sided weakness EXAM: CT HEAD WITHOUT CONTRAST TECHNIQUE: Contiguous axial images were obtained from the base of the skull through the vertex without intravenous contrast. COMPARISON:  CT head 04/15/2019 FINDINGS: Brain: Ventricle size normal. Small hypodensity anterior limb internal capsule on the left unchanged from the prior study. Negative for acute infarct, hemorrhage, mass. No midline shift. Vascular: Negative for hyperdense vessel Skull: Negative Sinuses/Orbits: Extensive mucosal edema throughout the paranasal sinuses. Negative orbit. Other: None ASPECTS ([place] Stroke Program Early CT Score) - Ganglionic level infarction (caudate, lentiform nuclei, internal capsule, insula, M1-M3 cortex): 7 - Supraganglionic infarction (M4-M6 cortex): 3 Total score (0-10 with 10 being normal): 10 IMPRESSION: 1. No acute abnormality and no change from the prior study 2. ASPECTS is 10  Electronically Signed   By: Franchot Gallo M.D.   On: 05/31/2019 15:03    Jacek Colson T. [place]  If 7PM-7AM, please contact night-coverage [email or web address] Password [email or web address] 06/01/2019, 11:12 AM

## 2019-06-02 LAB — CBC WITH DIFFERENTIAL/PLATELET
Abs Immature Granulocytes: 0.1 10*3/uL — ABNORMAL HIGH (ref 0.00–0.07)
Basophils Absolute: 0.1 10*3/uL (ref 0.0–0.1)
Basophils Relative: 0 %
Eosinophils Absolute: 0.1 10*3/uL (ref 0.0–0.5)
Eosinophils Relative: 1 %
HCT: 23.2 % — ABNORMAL LOW (ref 36.0–46.0)
Hemoglobin: 7.5 g/dL — ABNORMAL LOW (ref 12.0–15.0)
Immature Granulocytes: 1 %
Lymphocytes Relative: 11 %
Lymphs Abs: 1.9 10*3/uL (ref 0.7–4.0)
MCH: 25.3 pg — ABNORMAL LOW (ref 26.0–34.0)
MCHC: 32.3 g/dL (ref 30.0–36.0)
MCV: 78.1 fL — ABNORMAL LOW (ref 80.0–100.0)
Monocytes Absolute: 1.7 10*3/uL — ABNORMAL HIGH (ref 0.1–1.0)
Monocytes Relative: 10 %
Neutro Abs: 13.8 10*3/uL — ABNORMAL HIGH (ref 1.7–7.7)
Neutrophils Relative %: 77 %
Platelets: 266 10*3/uL (ref 150–400)
RBC: 2.97 MIL/uL — ABNORMAL LOW (ref 3.87–5.11)
RDW: 15.2 % (ref 11.5–15.5)
WBC: 17.6 10*3/uL — ABNORMAL HIGH (ref 4.0–10.5)
nRBC: 0 % (ref 0.0–0.2)

## 2019-06-02 LAB — GLUCOSE, CAPILLARY
Glucose-Capillary: 86 mg/dL (ref 70–99)
Glucose-Capillary: 113 mg/dL — ABNORMAL HIGH (ref 70–99)
Glucose-Capillary: 110 mg/dL — ABNORMAL HIGH (ref 70–99)
Glucose-Capillary: 86 mg/dL (ref 70–99)

## 2019-06-02 LAB — BASIC METABOLIC PANEL
Anion gap: 8 (ref 5–15)
BUN: 35 mg/dL — ABNORMAL HIGH (ref 8–23)
CO2: 26 mmol/L (ref 22–32)
Calcium: 8.3 mg/dL — ABNORMAL LOW (ref 8.9–10.3)
Chloride: 105 mmol/L (ref 98–111)
Creatinine, Ser: 2.12 mg/dL — ABNORMAL HIGH (ref 0.44–1.00)
GFR calc Af Amer: 26 mL/min — ABNORMAL LOW (ref >60)
GFR calc non Af Amer: 22 mL/min — ABNORMAL LOW (ref >60)
Glucose, Bld: 101 mg/dL — ABNORMAL HIGH (ref 70–99)
Potassium: 3.9 mmol/L (ref 3.5–5.1)
Sodium: 139 mmol/L (ref 135–145)

## 2019-06-02 LAB — AMMONIA: Ammonia: 50 umol/L — ABNORMAL HIGH (ref 9–35)

## 2019-06-02 LAB — MAGNESIUM: Magnesium: 2.3 mg/dL (ref 1.7–2.4)

## 2019-06-02 MED ORDER — LACTULOSE 10 GM/15ML PO SOLN
30.0000 g | Freq: Two times a day (BID) | ORAL | Status: DC
  Administered 2019-06-02 – 2019-06-04 (×3): 30 g via ORAL
  Filled 2019-06-02 (×5): qty 45

## 2019-06-02 NOTE — Progress Notes (Signed)
Pt c/o difficulty breathing with O2 sat 100% on RA and asking for O2.  LS diminished in LL. Dr. Doristine Bosworth made aware.  No new orders.  Idolina Primer, RN

## 2019-06-02 NOTE — Progress Notes (Addendum)
PROGRESS NOTE  Stephanie Yates OJJ:[id number] DOB: March 17, 1946   PCP: Josetta Huddle, MD  Patient is from: Home  DOA: 05/26/2019 LOS: 7  Brief Narrative / Interim history: 73 year old female with history of systolic CHF/NICM (EF 10 to 15%), VT s/p AICD 2014 and biventricular PPM, paroxysmal A. fib/bradycardia, chronic anemia, CKD-4, HTN, HLD, moderate protein calorie malnutrition and GIB, prolonged QT/torsade and multiple hospitalizations presented to ED 11/38/20 with abdominal pain, nausea, vomiting, shortness of breath and lower extremity edema.   In ED, HDS.  W 16.  Hgb 7.7 (baseline 8-9).  Creatinine 2.0 (about baseline).  BNP 1587 (slightly higher than baseline).  CXR concerning for CHF.  CT abdomen and pelvis with right large inguinal hernia without obstruction or incarceration.  FOBT positive.  She was admitted for CHF exacerbation and GI bleed.  Started on IV diuretics.  Will give GI consulted.  Patient was evaluated by Dr. Ethlyn Daniels from [facility].  GI did not feel EGD or colonoscopy is necessary as she had several of them in the last 2 months.  GI suggested intermittent blood and iron transfusion unless rampant overt bleeding in which case she would need transfer to tertiary center given his significant cardiac history.  Advanced heart failure team consulted 05/28/2019.  Started on higher dose IV Lasix.  Patient had [place] on 05/30/2019 that revealed moderately elevated filling pressure with normal cardiac output.   On 12/3, patient developed dizziness that she described as tenderness moving up and down.  CT head without contrast obtained but no acute finding. Neuro exam although limited was not impressive except for generalized weakness.  She was given meclizine x1.  Of note, patient has history of dizziness for the last 3 months per patient's daughter.  On 05/31/2019, had some ataxia, left-sided arm weakness and dysarthria when evaluated by PT.  Code stroke activated.  Stat CT head, CTA head and  neck obtained but not significant finding.  EEG without epileptiform discharge.  Ammonia elevated to 105.  Started on lactulose.  Assessment & Plan: Stroke-like symptoms/hepatic encephalopathy:  Ammonia elevated to 105.  CT head, and CTA head and neck without acute finding.  Neuro exam not convincing for CVA.  Awake but slow.  She is oriented x4 -Ammonia level is now down to 50.  Continue lactulose 3 times daily for goal of 2-3 BMs a day -Repeat CT head from 06/01/2019 again unremarkable.  AICD not compatible with MRI.  -CT abdomen pelvis from 06/01/2019 again unremarkable.  UA not impressive.  Acute on chronic systolic CHF/NICM: NYHA IIIb-IV.  most recent echo with EF of 10 to 15%.  Per advanced HF team, patient declined palliative care/hospice in the past.  Patient has cardinal symptoms including dyspnea, orthopnea and PND.  Total output 300 mL in last 24 hours.  Unsure about reliability.  Renal function stable. -RHC on 12/3 with moderately elevated filling pressure with normal cardiac output.  -Advanced HF managing-on p.o. Lasix 40 mg daily. -Limited on options for GDMT due to CKD -On low-dose Coreg and low-dose hydralazine -Monitor fluid status, renal function and electrolytes -Sodium and fluid restriction -May have to hold diuretics and cardiac meds until we exclude CVA.  History of recurrent VT s/p AICD -Continue telemetry monitoring -Optimize K and Mg  Paroxysmal A. Fib/bradycardia -Coreg as above -On Eliquis for anticoagulation prior to admission but this has been on hold due to GI bleeding.  Acute on chronic blood loss anemia in patient with GI bleed and chronic kidney disease Acute on  chronic GI bleed-FOBT positive.  No scope per GI.  Consider tertiary center if significant bleeding.  GI signed off.  Anemia panel normal. -Hgb 8-9 (baseline)> 7.7 (admit)>> 7.7> 7.8> 8.3> 7.5 -Continue PPI -Monitor H&H daily.  CKD-4: Stable. -Continue monitoring  Leukocytosis: No obvious  source of infection.  She has abdominal tenderness on exam.  Initial CT abdomen and pelvis reassuring and so was repeat CT abdomen done on 06/01/2019.  CT head with chronic sinusitis.  CXR consistent with CHF.  UA not impressive.  Abdominal pain/tenderness: could be due to CHF, large right inguinal hernia and constipation.  Initial CT A/P reassuring and so a CT abdomen and pelvis debated on 06/01/2019.  Essential hypertension: Slightly on the low side but asymptomatic. -Cardiac meds and diuretics as above and managed by cardiology.  Mild hyperkalemia: Resolved. -Continue monitoring  Constipation: Disimpacted 12/2. -Started on lactulose.  Has had 7 bowel movements in last 24 hours.  Will de-escalate lactulose to twice daily instead of 3 times daily.   Pressure Injury 05/29/19 Buttocks Left;Right Stage I -  Intact skin with non-blanchable redness of a localized area usually over a bony prominence. (Active)  05/29/19 1105  Location: Buttocks  Location Orientation: Left;Right  Staging: Stage I -  Intact skin with non-blanchable redness of a localized area usually over a bony prominence.  Wound Description (Comments):   Present on Admission: Yes     Pressure Injury 05/31/19 Buttocks Left Stage II -  Partial thickness loss of dermis presenting as a shallow open ulcer with a red, pink wound bed without slough. (Active)  05/31/19 0800  Location: Buttocks  Location Orientation: Left  Staging: Stage II -  Partial thickness loss of dermis presenting as a shallow open ulcer with a red, pink wound bed without slough.  Wound Description (Comments):   Present on Admission:     Moderate protein calorie malnutrition: -Dietitian consulted. Nutrition Problem: Increased nutrient needs Etiology: chronic illness  Signs/Symptoms: estimated needs  Interventions: Education   DVT prophylaxis: SCD Code Status: Partial.  No intubation. Family Communication: No family present bedside. Disposition Plan:  Remains inpatient for CHF and hepatic encephalopathy.  PT recommends HHD.  Hopefully she should be ready to discharge in next 1 to 2 days. Consultants: GI (signed off), advanced HF, neurology   Subjective: Patient seen and examined.  She is alert and oriented but slow in response.  She complains of left heel pain but no other complaint.  Objective: Vitals:   06/01/19 2048 06/02/19 0012 06/02/19 0436 06/02/19 0758  BP: (!) 123/51 (!) 135/59 (!) 104/52 (!) 110/47  Pulse: 60 69 65 63  Resp: 18 19 17 15   Temp: 98.4 F (36.9 C) 98.7 F (37.1 C) 98.2 F (36.8 C) 98.9 F (37.2 C)  TempSrc: Axillary Axillary Oral Oral  SpO2: 96% 95% 96% 97%  Weight:   61 kg   Height:        Intake/Output Summary (Last 24 hours) at 06/02/2019 0853 Last data filed at 06/01/2019 2137 Gross per 24 hour  Intake 246 ml  Output 330 ml  Net -84 ml   Filed Weights   05/31/19 0422 06/01/19 0339 06/02/19 0436  Weight: 61.5 kg 61.4 kg 61 kg    Examination:  General exam: Appears calm and comfortable  Respiratory system: Crackles at bases bilaterally. Respiratory effort normal. Cardiovascular system: S1 & S2 heard, RRR. No JVD, murmurs, rubs, gallops or clicks. No pedal edema. Gastrointestinal system: Abdomen is nondistended, soft and generalized tenderness, no  organomegaly or masses felt. Normal bowel sounds heard. Central nervous system: Alert and oriented. No focal neurological deficits. Extremities: Symmetric 5 x 5 power. Skin: Dressing in place in both heels. Psychiatry: Judgement and insight appear poor, mood & affect flat.  Procedures:  12/3-RHC with moderately elevated filling pressure with normal cardiac output.   Microbiology summarized: COVID-19 negative  Sch Meds:  Scheduled Meds: . amiodarone  200 mg Oral BID  . apixaban  2.5 mg Oral BID  . atorvastatin  40 mg Oral q1800  . carvedilol  3.125 mg Oral BID WC  . feeding supplement (ENSURE ENLIVE)  237 mL Oral Q24H  . ferrous gluconate   324 mg Oral BID  . furosemide  40 mg Oral Daily  . hydrALAZINE  12.5 mg Oral TID  . insulin aspart  0-6 Units Subcutaneous TID WC  . lactulose  30 g Oral TID  . loratadine  10 mg Oral Daily  . magnesium oxide  400 mg Oral Daily  . mexiletine  200 mg Oral BID  . pantoprazole  40 mg Oral Daily  . sodium chloride flush  3 mL Intravenous Q12H  . sodium chloride flush  3 mL Intravenous Q12H  . sodium chloride flush  3 mL Intravenous Q12H  . sucralfate  1 g Oral TID WC & HS   Continuous Infusions: . sodium chloride    . sodium chloride     PRN Meds:.sodium chloride, sodium chloride, acetaminophen, albuterol, promethazine, sodium chloride, sodium chloride flush, sodium chloride flush  Antimicrobials: Anti-infectives (From admission, onward)   None       I have personally reviewed the following labs and images: CBC: Recent Labs  Lab 05/29/19 1430 05/30/19 0406  05/30/19 0814 05/30/19 1033 05/31/19 0324 06/01/19 0317 06/02/19 0419  WBC 19.0* 15.5*  --   --  16.0* 15.6* 17.4* 17.6*  NEUTROABS 14.9*  --   --   --   --   --   --  13.8*  HGB 8.5* 7.8*   < > 8.5* 8.1* 8.1* 8.3* 7.5*  HCT 26.2* 23.3*   < > 25.0* 24.5* 24.8* 25.5* 23.2*  MCV 79.9* 77.4*  --   --  78.8* 78.2* 78.7* 78.1*  PLT 333 288  --   --  311 262 287 266   < > = values in this interval not displayed.   BMP &GFR Recent Labs  Lab 05/29/19 0352 05/30/19 0406  05/30/19 0810 05/30/19 0814 05/30/19 1033 05/31/19 0324 06/01/19 0317 06/02/19 0419  NA 138 138   < > 142 143  --  136 138 139  K 4.4 4.3   < > 4.4 4.2  --  4.3 5.0 3.9  CL 103 105  --   --   --   --  105 105 105  CO2 23 25  --   --   --   --  24 23 26   GLUCOSE 95 84  --   --   --   --  98 74 101*  BUN 31* 33*  --   --   --   --  34* 37* 35*  CREATININE 2.03* 1.96*  --   --   --  2.10* 2.07* 2.04* 2.12*  CALCIUM 8.3* 8.1*  --   --   --   --  8.2* 8.1* 8.3*  MG 2.2 2.1  --   --   --   --  2.2 2.3 2.3   < > = values in  this interval not  displayed.   Estimated Creatinine Clearance: 18.7 mL/min (A) (by C-G formula based on SCr of 2.12 mg/dL (H)). Liver & Pancreas: Recent Labs  Lab 05/26/19 1549 05/29/19 1430 06/01/19 0317  AST 85* 85* 92*  ALT 39 38 39  ALKPHOS 98 100 105  BILITOT 1.2 1.1 1.2  PROT 7.0 7.3 6.5  ALBUMIN 2.2* 2.1* 2.0*   Recent Labs  Lab 05/26/19 1549  LIPASE 28   Recent Labs  Lab 05/31/19 1644 06/01/19 0948 06/02/19 0419  AMMONIA 105* 79* 50*   Diabetic: Recent Labs    05/31/19 0324  HGBA1C 5.6   Recent Labs  Lab 06/01/19 0859 06/01/19 1119 06/01/19 1636 06/01/19 2142 06/02/19 0824  GLUCAP 84 89 101* 99 86   Cardiac Enzymes: Recent Labs  Lab 05/30/19 0406  CKTOTAL 21*   No results for input(s): PROBNP in the last 8760 hours. Coagulation Profile: No results for input(s): INR, PROTIME in the last 168 hours. Thyroid Function Tests: No results for input(s): TSH, T4TOTAL, FREET4, T3FREE, THYROIDAB in the last 72 hours. Lipid Profile: No results for input(s): CHOL, HDL, LDLCALC, TRIG, CHOLHDL, LDLDIRECT in the last 72 hours. Anemia Panel: No results for input(s): VITAMINB12, FOLATE, FERRITIN, TIBC, IRON, RETICCTPCT in the last 72 hours. Urine analysis:    Component Value Date/Time   COLORURINE AMBER (A) 06/01/2019 1954   APPEARANCEUR CLOUDY (A) 06/01/2019 1954   LABSPEC 1.020 06/01/2019 1954   PHURINE 5.0 06/01/2019 1954   GLUCOSEU NEGATIVE 06/01/2019 1954   HGBUR SMALL (A) 06/01/2019 [place] NEGATIVE 06/01/2019 [place] NEGATIVE 06/01/2019 1954   PROTEINUR 30 (A) 06/01/2019 1954   UROBILINOGEN 1.0 08/29/2014 2035   NITRITE NEGATIVE 06/01/2019 1954   LEUKOCYTESUR TRACE (A) 06/01/2019 1954   Sepsis Labs: Invalid input(s): PROCALCITONIN, [place]  Microbiology: Recent Results (from the past 240 hour(s))  SARS CORONAVIRUS 2 (TAT 6-24 HRS) Nasopharyngeal Nasopharyngeal Swab     Status: None   Collection Time: 05/26/19 12:32 AM   Specimen:  Nasopharyngeal Swab  Result Value Ref Range Status   SARS Coronavirus 2 NEGATIVE NEGATIVE Final    Comment: (NOTE) SARS-CoV-2 target nucleic acids are NOT DETECTED. The SARS-CoV-2 RNA is generally detectable in upper and lower respiratory specimens during the acute phase of infection. Negative results do not preclude SARS-CoV-2 infection, do not rule out co-infections with other pathogens, and should not be used as the sole basis for treatment or other patient management decisions. Negative results must be combined with clinical observations, patient history, and epidemiological information. The expected result is Negative. Fact Sheet for Patients: SugarRoll.be Fact Sheet for Healthcare Providers: https://www.woods-mathews.com/ This test is not yet approved or cleared by the [place] FDA and  has been authorized for detection and/or diagnosis of SARS-CoV-2 by FDA under an Emergency Use Authorization (EUA). This EUA will remain  in effect (meaning this test can be used) for the duration of the COVID-19 declaration under Section 56 4(b)(1) of the Act, 21 U.S.C. section 360bbb-3(b)(1), unless the authorization is terminated or revoked sooner. Performed at [facility], [place] [address], [place], [place] [address]     Radiology Studies: Ct Abdomen Pelvis Wo Contrast  Result Date: 06/01/2019 CLINICAL DATA:  Abdominal pain.  Concern for diverticulitis. EXAM: CT ABDOMEN AND PELVIS WITHOUT CONTRAST TECHNIQUE: Multidetector CT imaging of the abdomen and pelvis was performed following the standard protocol without IV contrast. COMPARISON:  05/26/2019 FINDINGS: The lack of intravenous contrast limits the ability to  evaluate solid abdominal organs. Lower chest: Limited visualization of lower thorax demonstrates minimal bibasilar heterogeneous opacities, left greater than right, improved compared to the 11/29 examination. No pleural effusion.  Cardiomegaly. Pacer leads again terminate within the right atrium and ventricle. No pericardial effusion. Hepatobiliary: Nodularity hepatic contour. There is diffuse decreased attempt increased attenuation of the hepatic parenchyma as could be seen in the setting of chronic amiodarone therapy. Trace amount of perihepatic ascites, unchanged to slightly progressed compared to the 05/26/2019 examination. Normal noncontrast appearance of the gallbladder. No radiopaque gallstones. Pancreas: Normal noncontrast appearance of the pancreas. Spleen: Normal noncontrast appearance of the spleen. Adrenals/Urinary Tract: The bilateral kidneys again appear atrophic. Punctate nonobstructing renal stones are again seen bilaterally with dominant nonobstructing stone within the inferior pole the right kidney measuring approximately 4 mm in diameter (image 38, series 3) and dominant nonobstructing stone within the inferior pole the left kidney measuring approximately 2 mm (image 36, series 3). No renal stones are seen along the expected course of either ureter. Excreted contrast from previous contrast enhance head and neck CTA is seen within the urinary bladder. No urinary obstruction or perinephric stranding. Redemonstrated approximately 3.8 x 1.9 cm hypoattenuating right-sided adrenal adenoma. Normal noncontrast appearance of the left adrenal gland. Stomach/Bowel: Ingested enteric contrast extends to the level of the hepatic flexure of the colon. Redemonstrated small right-sided inguinal hernia which is noted to contain a short-segment of nondilated small bowel (representative coronal images 39 through 82, series 6), not resulting in enteric obstruction. Moderate colonic stool burden. Scattered colonic diverticulosis without evidence of superimposed acute diverticulitis. Normal appearance of the terminal ileum. The appendix is not visualized, however there is no definitive pericecal inflammatory change on this noncontrast  examination. No pneumoperitoneum, pneumatosis or portal venous gas. Vascular/Lymphatic: Atherosclerotic plaque within normal caliber abdominal aorta. No bulky retroperitoneal, mesenteric, pelvic or inguinal lymphadenopathy. Reproductive: Dystrophic calcifications, presumed degenerating fibroids within and expectedly atrophic uterus. No discrete adnexal lesion. Trace amount of fluid in the pelvic cul-de-sac. Other: Moderate amount of diffuse body wall anasarca potentially secondary to third spacing. Musculoskeletal: No acute or aggressive osseous abnormalities. Grade 1 anterolisthesis of L4 upon L5 and L5 upon S1 without associated pars defects. A bone island is noted within the left acetabulum. IMPRESSION: 1. No explanation for patient's abdominal pain. Specifically, no evidence enteric obstruction or diverticulitis on this noncontrast examination. 2. Redemonstrated right inguinal hernia containing nondilated loops of small bowel and not resulting in enteric obstruction. 3. Nonobstructive bilateral nephrolithiasis unchanged. 4. Cardiomegaly with diffuse body wall anasarca as could be seen in the setting early CHF. Clinical correlation is advised. 5. Suspected early cirrhotic change with minimal amount perihepatic ascites. 6.  Aortic Atherosclerosis (ICD10-I70.0). Electronically Signed   By: Sandi Mariscal M.D.   On: 06/01/2019 12:08   Ct Head Wo Contrast  Result Date: 06/01/2019 CLINICAL DATA:  Encephalopathy. Follow-up for question of left basal ganglia region stroke. EXAM: CT HEAD WITHOUT CONTRAST TECHNIQUE: Contiguous axial images were obtained from the base of the skull through the vertex without intravenous contrast. COMPARISON:  CT 05/31/2019, 05/30/2019 and 04/15/2019. FINDINGS: Brain: No acute finding. The brainstem and cerebellum appear normal. Cerebral hemispheres show mild chronic small-vessel ischemic change of the white matter. This includes an area of low-density in the anterior limb internal capsule  on the left which was present in October in therefore not acute. No sign of acute infarction, mass lesion, hemorrhage, hydrocephalus or extra-axial collection. Vascular: There is atherosclerotic calcification of the major vessels at the  base of the brain. Skull: Negative Sinuses/Orbits: Widespread sinus opacification consistent with rhinosinusitis. Orbits negative. Other: None IMPRESSION: No acute intracranial finding. Chronic small-vessel ischemic changes of the white matter. Low-density in the anterior limb internal capsule on the left question as acute on the recent study was, I think, present on 04/15/2019 and therefore not acute. Widespread rhinosinusitis. Electronically Signed   By: Nelson Chimes M.D.   On: 06/01/2019 11:12   Total time spent 31 minutes Darliss Cheney, MD [facility]  If 7PM-7AM, please contact night-coverage [email or web address] Password [email or web address] 06/02/2019, 8:53 AM

## 2019-06-03 LAB — CBC WITH DIFFERENTIAL/PLATELET
Abs Immature Granulocytes: 0.12 10*3/uL — ABNORMAL HIGH (ref 0.00–0.07)
Basophils Absolute: 0.1 10*3/uL (ref 0.0–0.1)
Basophils Relative: 0 %
Eosinophils Absolute: 0.2 10*3/uL (ref 0.0–0.5)
Eosinophils Relative: 1 %
HCT: 24.7 % — ABNORMAL LOW (ref 36.0–46.0)
Hemoglobin: 8.2 g/dL — ABNORMAL LOW (ref 12.0–15.0)
Immature Granulocytes: 1 %
Lymphocytes Relative: 11 %
Lymphs Abs: 2.5 10*3/uL (ref 0.7–4.0)
MCH: 25.4 pg — ABNORMAL LOW (ref 26.0–34.0)
MCHC: 33.2 g/dL (ref 30.0–36.0)
MCV: 76.5 fL — ABNORMAL LOW (ref 80.0–100.0)
Monocytes Absolute: 2.7 10*3/uL — ABNORMAL HIGH (ref 0.1–1.0)
Monocytes Relative: 12 %
Neutro Abs: 16.6 10*3/uL — ABNORMAL HIGH (ref 1.7–7.7)
Neutrophils Relative %: 75 %
Platelets: 308 10*3/uL (ref 150–400)
RBC: 3.23 MIL/uL — ABNORMAL LOW (ref 3.87–5.11)
RDW: 15.4 % (ref 11.5–15.5)
WBC: 22.2 10*3/uL — ABNORMAL HIGH (ref 4.0–10.5)
nRBC: 0 % (ref 0.0–0.2)

## 2019-06-03 LAB — GLUCOSE, CAPILLARY
Glucose-Capillary: 111 mg/dL — ABNORMAL HIGH (ref 70–99)
Glucose-Capillary: 122 mg/dL — ABNORMAL HIGH (ref 70–99)
Glucose-Capillary: 104 mg/dL — ABNORMAL HIGH (ref 70–99)
Glucose-Capillary: 108 mg/dL — ABNORMAL HIGH (ref 70–99)

## 2019-06-03 LAB — MAGNESIUM: Magnesium: 2.4 mg/dL (ref 1.7–2.4)

## 2019-06-03 LAB — COMPREHENSIVE METABOLIC PANEL
ALT: 40 U/L (ref 0–44)
AST: 92 U/L — ABNORMAL HIGH (ref 15–41)
Albumin: 1.9 g/dL — ABNORMAL LOW (ref 3.5–5.0)
Alkaline Phosphatase: 122 U/L (ref 38–126)
Anion gap: 12 (ref 5–15)
BUN: 35 mg/dL — ABNORMAL HIGH (ref 8–23)
CO2: 25 mmol/L (ref 22–32)
Calcium: 8.4 mg/dL — ABNORMAL LOW (ref 8.9–10.3)
Chloride: 100 mmol/L (ref 98–111)
Creatinine, Ser: 2.36 mg/dL — ABNORMAL HIGH (ref 0.44–1.00)
GFR calc Af Amer: 23 mL/min — ABNORMAL LOW (ref >60)
GFR calc non Af Amer: 19 mL/min — ABNORMAL LOW (ref >60)
Glucose, Bld: 116 mg/dL — ABNORMAL HIGH (ref 70–99)
Potassium: 4 mmol/L (ref 3.5–5.1)
Sodium: 137 mmol/L (ref 135–145)
Total Bilirubin: 1.3 mg/dL — ABNORMAL HIGH (ref 0.3–1.2)
Total Protein: 6.8 g/dL (ref 6.5–8.1)

## 2019-06-03 LAB — AMMONIA: Ammonia: 79 umol/L — ABNORMAL HIGH (ref 9–35)

## 2019-06-03 NOTE — Progress Notes (Signed)
PROGRESS NOTE  Stephanie Yates JQB:[id number] DOB: 1946-03-19   PCP: Josetta Huddle, MD  Patient is from: Home  DOA: 05/26/2019 LOS: 7  Brief Narrative / Interim history: 73 year old female with history of systolic CHF/NICM (EF 10 to 15%), VT s/p AICD 2014 and biventricular PPM, paroxysmal A. fib/bradycardia, chronic anemia, CKD-4, HTN, HLD, moderate protein calorie malnutrition and GIB, prolonged QT/torsade and multiple hospitalizations presented to ED 11/38/20 with abdominal pain, nausea, vomiting, shortness of breath and lower extremity edema.   In ED, HDS.  W 16.  Hgb 7.7 (baseline 8-9).  Creatinine 2.0 (about baseline).  BNP 1587 (slightly higher than baseline).  CXR concerning for CHF.  CT abdomen and pelvis with right large inguinal hernia without obstruction or incarceration.  FOBT positive.  She was admitted for CHF exacerbation and GI bleed.  Started on IV diuretics.  Will give GI consulted.  Patient was evaluated by Dr. Ethlyn Daniels from [facility].  GI did not feel EGD or colonoscopy is necessary as she had several of them in the last 2 months.  GI suggested intermittent blood and iron transfusion unless rampant overt bleeding in which case she would need transfer to tertiary center given his significant cardiac history.  Advanced heart failure team consulted 05/28/2019.  Started on higher dose IV Lasix.  Patient had [place] on 05/30/2019 that revealed moderately elevated filling pressure with normal cardiac output.   On 12/3, patient developed dizziness that she described as tenderness moving up and down.  CT head without contrast obtained but no acute finding. Neuro exam although limited was not impressive except for generalized weakness.  She was given meclizine x1.  Of note, patient has history of dizziness for the last 3 months per patient's daughter.  On 05/31/2019, had some ataxia, left-sided arm weakness and dysarthria when evaluated by PT.  Code stroke activated.  Stat CT head, CTA head and  neck obtained but not significant finding.  EEG without epileptiform discharge.  Ammonia elevated to 105.  Started on lactulose.  Assessment & Plan: Stroke-like symptoms/hepatic encephalopathy:  Ammonia elevated to 105.  CT head, and CTA head and neck without acute finding.  Neuro exam not convincing for CVA.  Awake but slow.  She is oriented x4 -Ammonia level was down to 50 yesterday however jumped to 79 today but she continues to remain alert and oriented.  We will continue twice daily dose of lactulose. -Repeat CT head from 06/01/2019 again unremarkable.  AICD not compatible with MRI.   UA not impressive.  Acute on chronic systolic CHF/NICM: NYHA IIIb-IV.  most recent echo with EF of 10 to 15%.  Per advanced HF team, patient declined palliative care/hospice in the past.  Patient has cardinal symptoms including dyspnea, orthopnea and PND.  Total output 300 mL once again in last 24 hours, I highly doubt the reliability of charting. -RHC on 12/3 with moderately elevated filling pressure with normal cardiac output.  -Advanced HF managing-on p.o. Lasix 40 mg daily. -Limited on options for GDMT due to CKD -On low-dose Coreg and low-dose hydralazine.  Feels dizzy and has borderline low blood pressure.  Will discontinue hydralazine.  No further room to increase Lasix.  She is currently on 2 L of nasal oxygen.  Lungs clear to auscultation.  Asked RN to wean oxygen.  History of recurrent VT s/p AICD -Continue telemetry monitoring -Optimize K and Mg  Paroxysmal A. Fib/bradycardia -Coreg as above.  Rate controlled. -On Eliquis for anticoagulation prior to admission but this has been  on hold due to GI bleeding.  Acute on chronic blood loss anemia in patient with GI bleed and chronic kidney disease Acute on chronic GI bleed-FOBT positive.  No scope per GI.  Consider tertiary center if significant bleeding.  GI signed off.  Anemia panel normal. -Hgb 8-9 (baseline)> 7.7 (admit)>> 7.7> 7.8> 8.3> 7.5> 8.2  -Continue PPI -Monitor H&H daily.  CKD-4: Stable. -Continue monitoring  Leukocytosis: No obvious source of infection.  She has abdominal tenderness on exam.  Initial CT abdomen and pelvis reassuring and so was repeat CT abdomen done on 06/01/2019.  CT head with chronic sinusitis.  CXR consistent with CHF.  UA not impressive.  White cells slightly worse today but she remains afebrile and no other symptoms other than abdominal pain.  Abdominal pain/tenderness: could be due to CHF, large right inguinal hernia and constipation.  Initial CT A/P reassuring and so a CT abdomen and pelvis debated on 06/01/2019.  Essential hypertension: Slightly on the low side and she complains of dizziness.  Will discontinue hydralazine today.  Mild hyperkalemia: Resolved. -Continue monitoring  Constipation: Disimpacted 12/2. -Started on lactulose.  Has had 7 bowel movements in last 24 hours.  Will de-escalate lactulose to twice daily instead of 3 times daily.   Pressure Injury 05/29/19 Buttocks Left;Right Stage I -  Intact skin with non-blanchable redness of a localized area usually over a bony prominence. (Active)  05/29/19 1105  Location: Buttocks  Location Orientation: Left;Right  Staging: Stage I -  Intact skin with non-blanchable redness of a localized area usually over a bony prominence.  Wound Description (Comments):   Present on Admission: Yes     Pressure Injury 05/31/19 Buttocks Left Stage II -  Partial thickness loss of dermis presenting as a shallow open ulcer with a red, pink wound bed without slough. (Active)  05/31/19 0800  Location: Buttocks  Location Orientation: Left  Staging: Stage II -  Partial thickness loss of dermis presenting as a shallow open ulcer with a red, pink wound bed without slough.  Wound Description (Comments):   Present on Admission:     Moderate protein calorie malnutrition: -Dietitian consulted. Nutrition Problem: Increased nutrient needs Etiology: chronic illness   Signs/Symptoms: estimated needs  Interventions: Education   DVT prophylaxis: SCD Code Status: Partial.  No intubation. Family Communication: No family present bedside.  Patient lives by herself. Disposition Plan: Remains inpatient for CHF and hepatic encephalopathy.  PT recommends HHD.  I discussed with her possible plan of discharge home tomorrow.  She is in agreement. Consultants: GI (signed off), advanced HF, neurology   Subjective: Seen and examined.  Complains of dizziness.  No other complaint.  Denies shortness of breath, chest pain.  Objective: Vitals:   06/03/19 0633 06/03/19 0803 06/03/19 0917 06/03/19 0921  BP: (!) 93/46  (!) 91/45   Pulse: (!) 59   60  Resp: 15     Temp: 98 F (36.7 C) (!) 97.4 F (36.3 C)    TempSrc: Oral Oral    SpO2: 100%     Weight:      Height:        Intake/Output Summary (Last 24 hours) at 06/03/2019 1019 Last data filed at 06/03/2019 0848 Gross per 24 hour  Intake 223 ml  Output 300 ml  Net -77 ml   Filed Weights   05/31/19 0422 06/01/19 0339 06/02/19 0436  Weight: 61.5 kg 61.4 kg 61 kg    Examination:  General exam: Appears calm and comfortable  Respiratory system:  Clear to auscultation. Respiratory effort normal. Cardiovascular system: S1 & S2 heard, irregularly irregular rate rhythm. No JVD, murmurs, rubs, gallops or clicks. No pedal edema. Gastrointestinal system: Abdomen is nondistended, soft and nontender. No organomegaly or masses felt. Normal bowel sounds heard. Central nervous system: Alert and oriented. No focal neurological deficits. Extremities: Symmetric 5 x 5 power. Skin: No rashes, lesions or ulcers.  Psychiatry: Judgement and insight appear poor. Mood & affect flat   Procedures:  12/3-RHC with moderately elevated filling pressure with normal cardiac output.   Microbiology summarized: COVID-19 negative  Sch Meds:  Scheduled Meds: . amiodarone  200 mg Oral BID  . apixaban  2.5 mg Oral BID  . atorvastatin   40 mg Oral q1800  . carvedilol  3.125 mg Oral BID WC  . feeding supplement (ENSURE ENLIVE)  237 mL Oral Q24H  . ferrous gluconate  324 mg Oral BID  . furosemide  40 mg Oral Daily  . insulin aspart  0-6 Units Subcutaneous TID WC  . lactulose  30 g Oral BID  . loratadine  10 mg Oral Daily  . magnesium oxide  400 mg Oral Daily  . mexiletine  200 mg Oral BID  . pantoprazole  40 mg Oral Daily  . sodium chloride flush  3 mL Intravenous Q12H  . sodium chloride flush  3 mL Intravenous Q12H  . sodium chloride flush  3 mL Intravenous Q12H  . sucralfate  1 g Oral TID WC & HS   Continuous Infusions: . sodium chloride    . sodium chloride     PRN Meds:.sodium chloride, sodium chloride, acetaminophen, albuterol, promethazine, sodium chloride, sodium chloride flush, sodium chloride flush  Antimicrobials: Anti-infectives (From admission, onward)   None       I have personally reviewed the following labs and images: CBC: Recent Labs  Lab 05/29/19 1430  05/30/19 1033 05/31/19 0324 06/01/19 0317 06/02/19 0419 06/03/19 0431  WBC 19.0*   < > 16.0* 15.6* 17.4* 17.6* 22.2*  NEUTROABS 14.9*  --   --   --   --  13.8* 16.6*  HGB 8.5*   < > 8.1* 8.1* 8.3* 7.5* 8.2*  HCT 26.2*   < > 24.5* 24.8* 25.5* 23.2* 24.7*  MCV 79.9*   < > 78.8* 78.2* 78.7* 78.1* 76.5*  PLT 333   < > 311 262 287 266 308   < > = values in this interval not displayed.   BMP &GFR Recent Labs  Lab 05/30/19 0406  05/30/19 0814 05/30/19 1033 05/31/19 0324 06/01/19 0317 06/02/19 0419 06/03/19 0431  NA 138   < > 143  --  136 138 139 137  K 4.3   < > 4.2  --  4.3 5.0 3.9 4.0  CL 105  --   --   --  105 105 105 100  CO2 25  --   --   --  24 23 26 25   GLUCOSE 84  --   --   --  98 74 101* 116*  BUN 33*  --   --   --  34* 37* 35* 35*  CREATININE 1.96*  --   --  2.10* 2.07* 2.04* 2.12* 2.36*  CALCIUM 8.1*  --   --   --  8.2* 8.1* 8.3* 8.4*  MG 2.1  --   --   --  2.2 2.3 2.3 2.4   < > = values in this interval not  displayed.   Estimated Creatinine Clearance: 16.8 mL/min (  A) (by C-G formula based on SCr of 2.36 mg/dL (H)). Liver & Pancreas: Recent Labs  Lab 05/29/19 1430 06/01/19 0317 06/03/19 0431  AST 85* 92* 92*  ALT 38 39 40  ALKPHOS 100 105 122  BILITOT 1.1 1.2 1.3*  PROT 7.3 6.5 6.8  ALBUMIN 2.1* 2.0* 1.9*   No results for input(s): LIPASE, AMYLASE in the last 168 hours. Recent Labs  Lab 05/31/19 1644 06/01/19 0948 06/02/19 0419 06/03/19 0433  AMMONIA 105* 79* 50* 79*   Diabetic: No results for input(s): HGBA1C in the last 72 hours. Recent Labs  Lab 06/02/19 0824 06/02/19 1225 06/02/19 1511 06/02/19 2114 06/03/19 0800  GLUCAP 86 113* 110* 86 111*   Cardiac Enzymes: Recent Labs  Lab 05/30/19 0406  CKTOTAL 21*   No results for input(s): PROBNP in the last 8760 hours. Coagulation Profile: No results for input(s): INR, PROTIME in the last 168 hours. Thyroid Function Tests: No results for input(s): TSH, T4TOTAL, FREET4, T3FREE, THYROIDAB in the last 72 hours. Lipid Profile: No results for input(s): CHOL, HDL, LDLCALC, TRIG, CHOLHDL, LDLDIRECT in the last 72 hours. Anemia Panel: No results for input(s): VITAMINB12, FOLATE, FERRITIN, TIBC, IRON, RETICCTPCT in the last 72 hours. Urine analysis:    Component Value Date/Time   COLORURINE AMBER (A) 06/01/2019 1954   APPEARANCEUR CLOUDY (A) 06/01/2019 1954   LABSPEC 1.020 06/01/2019 1954   PHURINE 5.0 06/01/2019 1954   GLUCOSEU NEGATIVE 06/01/2019 1954   HGBUR SMALL (A) 06/01/2019 [place] NEGATIVE 06/01/2019 [place] NEGATIVE 06/01/2019 1954   PROTEINUR 30 (A) 06/01/2019 1954   UROBILINOGEN 1.0 08/29/2014 2035   NITRITE NEGATIVE 06/01/2019 1954   LEUKOCYTESUR TRACE (A) 06/01/2019 1954   Sepsis Labs: Invalid input(s): PROCALCITONIN, [place]  Microbiology: Recent Results (from the past 240 hour(s))  SARS CORONAVIRUS 2 (TAT 6-24 HRS) Nasopharyngeal Nasopharyngeal Swab     Status: None    Collection Time: 05/26/19 12:32 AM   Specimen: Nasopharyngeal Swab  Result Value Ref Range Status   SARS Coronavirus 2 NEGATIVE NEGATIVE Final    Comment: (NOTE) SARS-CoV-2 target nucleic acids are NOT DETECTED. The SARS-CoV-2 RNA is generally detectable in upper and lower respiratory specimens during the acute phase of infection. Negative results do not preclude SARS-CoV-2 infection, do not rule out co-infections with other pathogens, and should not be used as the sole basis for treatment or other patient management decisions. Negative results must be combined with clinical observations, patient history, and epidemiological information. The expected result is Negative. Fact Sheet for Patients: SugarRoll.be Fact Sheet for Healthcare Providers: https://www.woods-mathews.com/ This test is not yet approved or cleared by the [place] FDA and  has been authorized for detection and/or diagnosis of SARS-CoV-2 by FDA under an Emergency Use Authorization (EUA). This EUA will remain  in effect (meaning this test can be used) for the duration of the COVID-19 declaration under Section 56 4(b)(1) of the Act, 21 U.S.C. section 360bbb-3(b)(1), unless the authorization is terminated or revoked sooner. Performed at [facility], [place] [address] [place], [place] [address]   Culture, Urine     Status: Abnormal (Preliminary result)   Collection Time: 06/01/19  7:41 PM   Specimen: Urine, Clean Catch  Result Value Ref Range Status   Specimen Description URINE, CLEAN CATCH  Final   Special Requests   Final    NONE Performed at [facility], [place] [address] [place], [place] [address]    Culture >=100,000 COLONIES/mL GRAM NEGATIVE  RODS (A)  Final   Report Status PENDING  Incomplete    Radiology Studies: No results found. Total time spent 29 minutes Darliss Cheney, MD [facility]  If 7PM-7AM, please contact night-coverage  [email or web address] Password [email or web address] 06/03/2019, 10:19 AM

## 2019-06-03 NOTE — Progress Notes (Signed)
Pt complains of intermittent "gas pains", points to left lateral side. Pt states she feels like she needs to burp. Pt assisted to lay on left side, pt burped and expelled gas upon turning. Pt provided hot packs and asked to lie on Left side to see if more gas is expelled. Will continue to monitor. Jessie Foot, RN

## 2019-06-03 NOTE — Plan of Care (Signed)
  Problem: Clinical Measurements: Goal: Ability to maintain clinical measurements within normal limits will improve Outcome: Progressing Goal: Diagnostic test results will improve Outcome: Progressing Goal: Respiratory complications will improve Outcome: Progressing Goal: Cardiovascular complication will be avoided Outcome: Progressing   

## 2019-06-03 NOTE — Progress Notes (Signed)
Physical Therapy Treatment Patient Details Name: Stephanie Yates MRN: [id number] DOB: 1945-08-02 Today's Date: 06/03/2019    History of Present Illness pt is a 73 yo female presented to ED with c/o abdominal pain, B LE swelling. Several recent admissions for B LE swelling, SOB, chronic large R inguinal hernia. pt with vision changes and dizziness, Head CT 12/3- unremarkable. PMH: CHF, HTN, afib, HLD, CKD    PT Comments    Noted recent code stroke, CT negative and unable to do MRI due to PPM. Patient seems more returned to her normal self today, although she is only agreeable to standing beside the bed and defers walking today. Able to perform bed mobility with Min guard-MinA, functional transfers with MinAx2, VSS on supplemental O2. She states "maybe I'll feel like doing more tomorrow", unable to convince her to try ambulation today. She was left in bed with all needs met, bed alarm active this afternoon.    Follow Up Recommendations  Home health PT;Supervision for mobility/OOB     Equipment Recommendations  None recommended by PT    Recommendations for Other Services       Precautions / Restrictions Precautions Precautions: Fall Precaution Comments: watch 02 Restrictions Weight Bearing Restrictions: No    Mobility  Bed Mobility Overal bed mobility: Needs Assistance Bed Mobility: Supine to Sit;Sit to Supine     Supine to sit: Min guard;+2 for physical assistance Sit to supine: Min assist;+2 for physical assistance   General bed mobility comments: Min guardx2 to get to EOB, MinAx2 for back to bed mostly for LE management and trunk positioning  Transfers Overall transfer level: Needs assistance Equipment used: Rolling walker (2 wheeled) Transfers: Sit to/from Stand Sit to Stand: Min assist;+2 physical assistance         General transfer comment: MinAx2 for safety, patient stood for 2 mintues then declined further activity today  Ambulation/Gait              General Gait Details: declined further activity today   Stairs             Wheelchair Mobility    Modified Rankin (Stroke Patients Only)       Balance Overall balance assessment: Needs assistance Sitting-balance support: Single extremity supported;Feet supported;Bilateral upper extremity supported Sitting balance-Leahy Scale: Fair Sitting balance - Comments: S to maintain upright   Standing balance support: Bilateral upper extremity supported;During functional activity Standing balance-Leahy Scale: Fair Standing balance comment: reliant on external support                            Cognition Arousal/Alertness: Awake/alert Behavior During Therapy: Impulsive Overall Cognitive Status: Within Functional Limits for tasks assessed                                 General Comments: cognition and alertness much improved today      Exercises      General Comments General comments (skin integrity, edema, etc.): VSS      Pertinent Vitals/Pain Pain Assessment: No/denies pain Pain Score: 0-No pain Pain Intervention(s): Limited activity within patient's tolerance;Monitored during session    Home Living                      Prior Function            PT Goals (current goals can now be found in the  care plan section) Acute Rehab PT Goals Patient Stated Goal: go home PT Goal Formulation: With patient Time For Goal Achievement: 06/12/19 Potential to Achieve Goals: Good Progress towards PT goals: Progressing toward goals    Frequency    Min 3X/week      PT Plan Current plan remains appropriate    Co-evaluation              AM-PAC PT "6 Clicks" Mobility   Outcome Measure  Help needed turning from your back to your side while in a flat bed without using bedrails?: A Little Help needed moving from lying on your back to sitting on the side of a flat bed without using bedrails?: A Little Help needed moving to and from a  bed to a chair (including a wheelchair)?: A Little Help needed standing up from a chair using your arms (e.g., wheelchair or bedside chair)?: A Little Help needed to walk in hospital room?: A Little Help needed climbing 3-5 steps with a railing? : A Lot 6 Click Score: 17    End of Session Equipment Utilized During Treatment: Oxygen Activity Tolerance: Patient tolerated treatment well;Patient limited by fatigue Patient left: in bed;with call bell/phone within reach;with bed alarm set   PT Visit Diagnosis: Muscle weakness (generalized) (M62.81);Difficulty in walking, not elsewhere classified (R26.2);Hemiplegia and hemiparesis Hemiplegia - Right/Left: Left Hemiplegia - dominant/non-dominant: Non-dominant Pain - Right/Left: (bilateral) Pain - part of body: (abdomen)     Time: 1435-1450 PT Time Calculation (min) (ACUTE ONLY): 15 min  Charges:  $Therapeutic Activity: 8-22 mins                     Windell Norfolk, DPT, PN1   Supplemental Physical Therapist [place]    Pager [phone] Acute Rehab Office [phone]

## 2019-06-04 ENCOUNTER — Inpatient Hospital Stay (HOSPITAL_COMMUNITY): Payer: Medicare Other

## 2019-06-04 LAB — URINE CULTURE: Culture: >=100000 — AB

## 2019-06-04 LAB — CBC WITH DIFFERENTIAL/PLATELET
Abs Immature Granulocytes: 0.09 10*3/uL — ABNORMAL HIGH (ref 0.00–0.07)
Basophils Absolute: 0.1 10*3/uL (ref 0.0–0.1)
Basophils Relative: 0 %
Eosinophils Absolute: 0.2 10*3/uL (ref 0.0–0.5)
Eosinophils Relative: 2 %
HCT: 22.6 % — ABNORMAL LOW (ref 36.0–46.0)
Hemoglobin: 7.7 g/dL — ABNORMAL LOW (ref 12.0–15.0)
Immature Granulocytes: 1 %
Lymphocytes Relative: 10 %
Lymphs Abs: 1.6 10*3/uL (ref 0.7–4.0)
MCH: 26.1 pg (ref 26.0–34.0)
MCHC: 34.1 g/dL (ref 30.0–36.0)
MCV: 76.6 fL — ABNORMAL LOW (ref 80.0–100.0)
Monocytes Absolute: 1.9 10*3/uL — ABNORMAL HIGH (ref 0.1–1.0)
Monocytes Relative: 12 %
Neutro Abs: 11.6 10*3/uL — ABNORMAL HIGH (ref 1.7–7.7)
Neutrophils Relative %: 75 %
Platelets: 247 10*3/uL (ref 150–400)
RBC: 2.95 MIL/uL — ABNORMAL LOW (ref 3.87–5.11)
RDW: 15.2 % (ref 11.5–15.5)
WBC: 15.4 10*3/uL — ABNORMAL HIGH (ref 4.0–10.5)
nRBC: 0 % (ref 0.0–0.2)

## 2019-06-04 LAB — COMPREHENSIVE METABOLIC PANEL
ALT: 42 U/L (ref 0–44)
AST: 114 U/L — ABNORMAL HIGH (ref 15–41)
Albumin: 1.7 g/dL — ABNORMAL LOW (ref 3.5–5.0)
Alkaline Phosphatase: 132 U/L — ABNORMAL HIGH (ref 38–126)
Anion gap: 11 (ref 5–15)
BUN: 34 mg/dL — ABNORMAL HIGH (ref 8–23)
CO2: 25 mmol/L (ref 22–32)
Calcium: 8.2 mg/dL — ABNORMAL LOW (ref 8.9–10.3)
Chloride: 102 mmol/L (ref 98–111)
Creatinine, Ser: 2.35 mg/dL — ABNORMAL HIGH (ref 0.44–1.00)
GFR calc Af Amer: 23 mL/min — ABNORMAL LOW (ref >60)
GFR calc non Af Amer: 20 mL/min — ABNORMAL LOW (ref >60)
Glucose, Bld: 98 mg/dL (ref 70–99)
Potassium: 3.9 mmol/L (ref 3.5–5.1)
Sodium: 138 mmol/L (ref 135–145)
Total Bilirubin: 1.3 mg/dL — ABNORMAL HIGH (ref 0.3–1.2)
Total Protein: 6.2 g/dL — ABNORMAL LOW (ref 6.5–8.1)

## 2019-06-04 LAB — GLUCOSE, CAPILLARY
Glucose-Capillary: 99 mg/dL (ref 70–99)
Glucose-Capillary: 105 mg/dL — ABNORMAL HIGH (ref 70–99)
Glucose-Capillary: 118 mg/dL — ABNORMAL HIGH (ref 70–99)
Glucose-Capillary: 109 mg/dL — ABNORMAL HIGH (ref 70–99)

## 2019-06-04 LAB — AMMONIA: Ammonia: 124 umol/L — ABNORMAL HIGH (ref 9–35)

## 2019-06-04 MED ORDER — LACTULOSE 10 GM/15ML PO SOLN
30.0000 g | Freq: Three times a day (TID) | ORAL | Status: DC
  Administered 2019-06-04 – 2019-06-06 (×4): 30 g via ORAL
  Filled 2019-06-04 (×7): qty 45

## 2019-06-04 NOTE — Care Management Important Message (Signed)
Important Message  Patient Details  Name: CHEMERE STEFFLER MRN: [id number] Date of Birth: 1946-05-21   Medicare Important Message Given:  Yes     Shelda Altes 06/04/2019, 1:03 PM

## 2019-06-04 NOTE — Progress Notes (Addendum)
MD notified of pt refusing her afternoon dose of lactulose. RN stressed & educated pt on the importance of the medication given her ammonia level but pt still refused. Will continue to monitor the pt. Sadeel Fiddler, Corrinne Eagle, RN   Pt unable to ambulate for walking pulse ox but pt has been on RA with O2 sats in the high 90s since around 1000 am. Hoover Brunette, RN

## 2019-06-04 NOTE — Progress Notes (Signed)
PROGRESS NOTE  Stephanie Yates WER:[id number] DOB: 11/21/1945   PCP: Josetta Huddle, MD  Patient is from: Home  DOA: 05/26/2019 LOS: 8  Brief Narrative / Interim history: 73 year old female with history of systolic CHF/NICM (EF 10 to 15%), VT s/p AICD 2014 and biventricular PPM, paroxysmal A. fib/bradycardia, chronic anemia, CKD-4, HTN, HLD, moderate protein calorie malnutrition and GIB, prolonged QT/torsade and multiple hospitalizations presented to ED 11/38/20 with abdominal pain, nausea, vomiting, shortness of breath and lower extremity edema.   In ED, HDS.  W 16.  Hgb 7.7 (baseline 8-9).  Creatinine 2.0 (about baseline).  BNP 1587 (slightly higher than baseline).  CXR concerning for CHF.  CT abdomen and pelvis with right large inguinal hernia without obstruction or incarceration.  FOBT positive.  She was admitted for CHF exacerbation and GI bleed.  Started on IV diuretics.  Will give GI consulted.  Patient was evaluated by Dr. Ethlyn Daniels from [facility].  GI did not feel EGD or colonoscopy is necessary as she had several of them in the last 2 months.  GI suggested intermittent blood and iron transfusion unless rampant overt bleeding in which case she would need transfer to tertiary center given his significant cardiac history.  Advanced heart failure team consulted 05/28/2019.  Started on higher dose IV Lasix.  Patient had [place] on 05/30/2019 that revealed moderately elevated filling pressure with normal cardiac output.   On 12/3, patient developed dizziness that she described as tenderness moving up and down.  CT head without contrast obtained but no acute finding. Neuro exam although limited was not impressive except for generalized weakness.  She was given meclizine x1.  Of note, patient has history of dizziness for the last 3 months per patient's daughter.  On 05/31/2019, had some ataxia, left-sided arm weakness and dysarthria when evaluated by PT.  Code stroke activated.  Stat CT head, CTA head and  neck obtained but not significant finding.  EEG without epileptiform discharge.  Ammonia elevated to 105.  Started on lactulose.  Patient is not a candidate for MRI due to noncompatible AICD.  Assessment & Plan: Stroke-like symptoms/hepatic encephalopathy:  Ammonia elevated to 124 today however she remains alert and oriented. CT head, and CTA head and neck without acute finding.  Neuro exam not convincing for CVA.  -Repeat CT head from 06/01/2019 again unremarkable.  Once again today, when seen by PT, she was found to have drift of left leg and possible peripheral visual loss.  AICD not compatible with MRI.  She did not have any neuro symptoms when I saw her early this morning.  Will once again obtain CT head code stroke.  UA not impressive.  Patient is refusing lactulose.  Will increase frequency back to 3 times daily.  Acute on chronic systolic CHF/NICM: NYHA IIIb-IV.  most recent echo with EF of 10 to 15%.  Per advanced HF team, patient declined palliative care/hospice in the past.  Patient has cardinal symptoms including dyspnea, orthopnea and PND.  Total output 900 mL in last 24 hours. -RHC on 12/3 with moderately elevated filling pressure with normal cardiac output.  -Advanced HF managing-on p.o. Lasix 40 mg daily. -Limited on options for GDMT due to CKD.  Due to complaint of dizziness and hypotension, hydralazine was discontinued yesterday.  She remains on low-dose Coreg.  Blood pressure much better than yesterday but he still complains of dizziness.  Seen by PT OT yesterday and they recommended HHC.  Seen by PT OT again today and now they  are recommending SNF.  History of recurrent VT s/p AICD -Continue telemetry monitoring -Optimize K and Mg  Paroxysmal A. Fib/bradycardia -Coreg as above.  Rate controlled. -On Eliquis for anticoagulation prior to admission but this has been on hold due to GI bleeding.  Acute on chronic blood loss anemia in patient with GI bleed and chronic kidney disease  Acute on chronic GI bleed-FOBT positive.  No scope per GI.  Consider tertiary center if significant bleeding.  GI signed off.  Anemia panel normal. -Hgb 8-9 (baseline)> 7.7 (admit)>> 7.7> 7.8> 8.3> 7.5> 8.2> 7.7 -Continue PPI -Monitor H&H daily.  CKD-4: Stable. -Continue monitoring  Leukocytosis: No obvious source of infection.  She has abdominal tenderness on exam.  Initial CT abdomen and pelvis reassuring and so was repeat CT abdomen done on 06/01/2019.  CT head with chronic sinusitis.  CXR consistent with CHF.  UA not impressive.  White cells slightly worse today but she remains afebrile and no other symptoms other than abdominal pain.  Abdominal pain/tenderness: could be due to CHF, large right inguinal hernia and constipation.  Initial CT A/P reassuring and so a CT abdomen and pelvis debated on 06/01/2019.  Essential hypertension: Much better now.  Continue carvedilol.  Continue to hold hydralazine.  Mild hyperkalemia: Resolved. -Continue monitoring  Constipation: Disimpacted 12/2. -Started on lactulose.  Now due to increasing ammonia, will increase lactulose back to 3 times daily.   Pressure Injury 05/29/19 Buttocks Left;Right Stage I -  Intact skin with non-blanchable redness of a localized area usually over a bony prominence. (Active)  05/29/19 1105  Location: Buttocks  Location Orientation: Left;Right  Staging: Stage I -  Intact skin with non-blanchable redness of a localized area usually over a bony prominence.  Wound Description (Comments):   Present on Admission: Yes     Pressure Injury 05/31/19 Buttocks Left Stage II -  Partial thickness loss of dermis presenting as a shallow open ulcer with a red, pink wound bed without slough. (Active)  05/31/19 0800  Location: Buttocks  Location Orientation: Left  Staging: Stage II -  Partial thickness loss of dermis presenting as a shallow open ulcer with a red, pink wound bed without slough.  Wound Description (Comments):   Present  on Admission:     Moderate protein calorie malnutrition: -Dietitian consulted. Nutrition Problem: Increased nutrient needs Etiology: chronic illness  Signs/Symptoms: estimated needs  Interventions: Education   DVT prophylaxis: SCD Code Status: Partial.  No intubation. Family Communication: No family present at bedside.  Patient lives by herself. Disposition Plan: Remains inpatient for CHF and hepatic encephalopathy.  PT now recommend SNF.  Case manager aware.  Will be discharged when placement arranged. Consultants: GI (signed off), advanced HF, neurology   Subjective: Patient seen and examined this morning.  She complained of dizziness.  Some abdominal pain which is chronic.  No other complaint.  Objective: Vitals:   06/03/19 1831 06/03/19 1950 06/03/19 2350 06/04/19 0444  BP:  (!) 124/45 (!) 100/48 (!) 111/54  Pulse: 60 (!) 59 61 (!) 59  Resp:      Temp:  97.7 F (36.5 C) 98.1 F (36.7 C) 98.2 F (36.8 C)  TempSrc:  Oral Oral Oral  SpO2:  100% 100% 99%  Weight:    63.3 kg  Height:        Intake/Output Summary (Last 24 hours) at 06/04/2019 0826 Last data filed at 06/04/2019 0600 Gross per 24 hour  Intake 720 ml  Output 900 ml  Net -180 ml  Filed Weights   06/01/19 0339 06/02/19 0436 06/04/19 0444  Weight: 61.4 kg 61 kg 63.3 kg    Examination:  General exam: Appears calm and comfortable  Respiratory system: Clear to auscultation. Respiratory effort normal. Cardiovascular system: S1 & S2 heard, RRR. No JVD, murmurs, rubs, gallops or clicks. No pedal edema. Gastrointestinal system: Abdomen is nondistended, soft and generalized tenderness. No organomegaly or masses felt. Normal bowel sounds heard. Central nervous system: Alert and oriented. No focal neurological deficits. Extremities: Symmetric 5 x 5 power. Skin: No rashes, lesions or ulcers.  Psychiatry: Judgement and insight appear poor. Mood & affect flat   Procedures:  12/3-RHC with moderately elevated  filling pressure with normal cardiac output.   Microbiology summarized: COVID-19 negative  Sch Meds:  Scheduled Meds: . amiodarone  200 mg Oral BID  . apixaban  2.5 mg Oral BID  . atorvastatin  40 mg Oral q1800  . carvedilol  3.125 mg Oral BID WC  . feeding supplement (ENSURE ENLIVE)  237 mL Oral Q24H  . ferrous gluconate  324 mg Oral BID  . furosemide  40 mg Oral Daily  . insulin aspart  0-6 Units Subcutaneous TID WC  . lactulose  30 g Oral BID  . loratadine  10 mg Oral Daily  . magnesium oxide  400 mg Oral Daily  . mexiletine  200 mg Oral BID  . pantoprazole  40 mg Oral Daily  . sodium chloride flush  3 mL Intravenous Q12H  . sodium chloride flush  3 mL Intravenous Q12H  . sodium chloride flush  3 mL Intravenous Q12H  . sucralfate  1 g Oral TID WC & HS   Continuous Infusions: . sodium chloride    . sodium chloride     PRN Meds:.sodium chloride, sodium chloride, acetaminophen, albuterol, promethazine, sodium chloride, sodium chloride flush, sodium chloride flush  Antimicrobials: Anti-infectives (From admission, onward)   None       I have personally reviewed the following labs and images: CBC: Recent Labs  Lab 05/29/19 1430  05/31/19 0324 06/01/19 0317 06/02/19 0419 06/03/19 0431 06/04/19 0334  WBC 19.0*   < > 15.6* 17.4* 17.6* 22.2* 15.4*  NEUTROABS 14.9*  --   --   --  13.8* 16.6* 11.6*  HGB 8.5*   < > 8.1* 8.3* 7.5* 8.2* 7.7*  HCT 26.2*   < > 24.8* 25.5* 23.2* 24.7* 22.6*  MCV 79.9*   < > 78.2* 78.7* 78.1* 76.5* 76.6*  PLT 333   < > 262 287 266 308 247   < > = values in this interval not displayed.   BMP &GFR Recent Labs  Lab 05/30/19 0406  05/31/19 0324 06/01/19 0317 06/02/19 0419 06/03/19 0431 06/04/19 0334  NA 138   < > 136 138 139 137 138  K 4.3   < > 4.3 5.0 3.9 4.0 3.9  CL 105  --  105 105 105 100 102  CO2 25  --  24 23 26 25 25   GLUCOSE 84  --  98 74 101* 116* 98  BUN 33*  --  34* 37* 35* 35* 34*  CREATININE 1.96*   < > 2.07* 2.04*  2.12* 2.36* 2.35*  CALCIUM 8.1*  --  8.2* 8.1* 8.3* 8.4* 8.2*  MG 2.1  --  2.2 2.3 2.3 2.4  --    < > = values in this interval not displayed.   Estimated Creatinine Clearance: 17.2 mL/min (A) (by C-G formula based on SCr of 2.35 mg/dL (  H)). Liver & Pancreas: Recent Labs  Lab 05/29/19 1430 06/01/19 0317 06/03/19 0431 06/04/19 0334  AST 85* 92* 92* 114*  ALT 38 39 40 42  ALKPHOS 100 105 122 132*  BILITOT 1.1 1.2 1.3* 1.3*  PROT 7.3 6.5 6.8 6.2*  ALBUMIN 2.1* 2.0* 1.9* 1.7*   No results for input(s): LIPASE, AMYLASE in the last 168 hours. Recent Labs  Lab 05/31/19 1644 06/01/19 0948 06/02/19 0419 06/03/19 0433 06/04/19 0642  AMMONIA 105* 79* 50* 79* 124*   Diabetic: No results for input(s): HGBA1C in the last 72 hours. Recent Labs  Lab 06/02/19 2114 06/03/19 0800 06/03/19 1213 06/03/19 1721 06/03/19 2114  GLUCAP 86 111* 122* 104* 108*   Cardiac Enzymes: Recent Labs  Lab 05/30/19 0406  CKTOTAL 21*   No results for input(s): PROBNP in the last 8760 hours. Coagulation Profile: No results for input(s): INR, PROTIME in the last 168 hours. Thyroid Function Tests: No results for input(s): TSH, T4TOTAL, FREET4, T3FREE, THYROIDAB in the last 72 hours. Lipid Profile: No results for input(s): CHOL, HDL, LDLCALC, TRIG, CHOLHDL, LDLDIRECT in the last 72 hours. Anemia Panel: No results for input(s): VITAMINB12, FOLATE, FERRITIN, TIBC, IRON, RETICCTPCT in the last 72 hours. Urine analysis:    Component Value Date/Time   COLORURINE AMBER (A) 06/01/2019 1954   APPEARANCEUR CLOUDY (A) 06/01/2019 1954   LABSPEC 1.020 06/01/2019 1954   PHURINE 5.0 06/01/2019 1954   GLUCOSEU NEGATIVE 06/01/2019 1954   HGBUR SMALL (A) 06/01/2019 [place] NEGATIVE 06/01/2019 [place] NEGATIVE 06/01/2019 1954   PROTEINUR 30 (A) 06/01/2019 1954   UROBILINOGEN 1.0 08/29/2014 2035   NITRITE NEGATIVE 06/01/2019 1954   LEUKOCYTESUR TRACE (A) 06/01/2019 1954   Sepsis Labs:  Invalid input(s): PROCALCITONIN, [place]  Microbiology: Recent Results (from the past 240 hour(s))  SARS CORONAVIRUS 2 (TAT 6-24 HRS) Nasopharyngeal Nasopharyngeal Swab     Status: None   Collection Time: 05/26/19 12:32 AM   Specimen: Nasopharyngeal Swab  Result Value Ref Range Status   SARS Coronavirus 2 NEGATIVE NEGATIVE Final    Comment: (NOTE) SARS-CoV-2 target nucleic acids are NOT DETECTED. The SARS-CoV-2 RNA is generally detectable in upper and lower respiratory specimens during the acute phase of infection. Negative results do not preclude SARS-CoV-2 infection, do not rule out co-infections with other pathogens, and should not be used as the sole basis for treatment or other patient management decisions. Negative results must be combined with clinical observations, patient history, and epidemiological information. The expected result is Negative. Fact Sheet for Patients: SugarRoll.be Fact Sheet for Healthcare Providers: https://www.woods-mathews.com/ This test is not yet approved or cleared by the [place] FDA and  has been authorized for detection and/or diagnosis of SARS-CoV-2 by FDA under an Emergency Use Authorization (EUA). This EUA will remain  in effect (meaning this test can be used) for the duration of the COVID-19 declaration under Section 56 4(b)(1) of the Act, 21 U.S.C. section 360bbb-3(b)(1), unless the authorization is terminated or revoked sooner. Performed at [facility], [place] [address], [place], [place] [address]   Culture, Urine     Status: Abnormal   Collection Time: 06/01/19  7:41 PM   Specimen: Urine, Clean Catch  Result Value Ref Range Status   Specimen Description URINE, CLEAN CATCH  Final   Special Requests   Final    NONE Performed at [facility], [place] [address] [place], [place] [address]    Culture >=100,000 COLONIES/mL KLEBSIELLA PNEUMONIAE (A)  Final   Report Status  06/04/2019 FINAL  Final   Organism ID, Bacteria KLEBSIELLA PNEUMONIAE (A)  Final      Susceptibility   Klebsiella pneumoniae - MIC*    AMPICILLIN RESISTANT Resistant     CEFAZOLIN <=4 SENSITIVE Sensitive     CEFTRIAXONE <=1 SENSITIVE Sensitive     CIPROFLOXACIN <=0.25 SENSITIVE Sensitive     GENTAMICIN <=1 SENSITIVE Sensitive     IMIPENEM <=0.25 SENSITIVE Sensitive     NITROFURANTOIN <=16 SENSITIVE Sensitive     TRIMETH/SULFA <=20 SENSITIVE Sensitive     AMPICILLIN/SULBACTAM <=2 SENSITIVE Sensitive     PIP/TAZO <=4 SENSITIVE Sensitive     Extended ESBL NEGATIVE Sensitive     * >=100,000 COLONIES/mL KLEBSIELLA PNEUMONIAE    Radiology Studies: No results found. Total time spent 27 minutes Darliss Cheney, MD [facility]  If 7PM-7AM, please contact night-coverage [email or web address] Password [facility] 06/04/2019, 8:26 AM

## 2019-06-04 NOTE — Evaluation (Signed)
Physical Therapy Re-Evaluation Patient Details Name: Stephanie Yates MRN: [id number] DOB: 04-Dec-1945 Today's Date: 06/04/2019   History of Present Illness  pt is a 73 yo female presented to ED with c/o abdominal pain, B LE swelling. Several recent admissions for B LE swelling, SOB, chronic large R inguinal hernia. pt with vision changes and dizziness, Head CT 12/3- unremarkable. PMH: CHF, HTN, afib, HLD, CKD  Clinical Impression   Patient received in bed, fatigued but willing to participate in PT re-eval. Of note, code stroke called on 12/4 due to stroke like symptoms, CT/CTA clear (unable to do MRI due to PPM) but residual symptoms including L sided weakness, possible L visual field cut, ataxia, and severe reduction in functional activity tolerance persist. She required ModA for all functional bed mobility today, able to perform functional transfers with RW/min guard but very fatigued once standing. Took orthostatics due to visual symptoms in standing, BP and SpO2 WNL on room air in all positions but patient became increasingly tremulous and weak in standing, too fatigued to attempt gait with assist of +1. Will need +2 for chair follow for safety moving forward. She was left in bed with all needs met, bed alarm active. Updating formal PT recommendation to SNF and 24/7A moving forward.     Follow Up Recommendations SNF;Supervision/Assistance - 24 hour    Equipment Recommendations  Rolling walker with 5" wheels;3in1 (PT)    Recommendations for Other Services       Precautions / Restrictions Precautions Precautions: Fall Precaution Comments: watch 02 Restrictions Weight Bearing Restrictions: No      Mobility  Bed Mobility Overal bed mobility: Needs Assistance Bed Mobility: Supine to Sit;Sit to Supine     Supine to sit: Mod assist Sit to supine: Mod assist   General bed mobility comments: ModAx1 for all bed mobility, L lean noted once at EOB but able to correct with VC and extended  time  Transfers Overall transfer level: Needs assistance Equipment used: Rolling walker (2 wheeled) Transfers: Sit to/from Stand Sit to Stand: Min guard         General transfer comment: Min guard and VC for safety/sequencing to boost to full standing position, very easily fatigued and became increasingly tremulous just in time needed to take BP in standing  Ambulation/Gait             General Gait Details: deferred due to fatigue/safety- recommend chair follow  Stairs            Wheelchair Mobility    Modified Rankin (Stroke Patients Only) Modified Rankin (Stroke Patients Only) Pre-Morbid Rankin Score: Slight disability Modified Rankin: Moderately severe disability     Balance Overall balance assessment: Needs assistance Sitting-balance support: Single extremity supported;Feet supported;Bilateral upper extremity supported Sitting balance-Leahy Scale: Fair Sitting balance - Comments: S to maintain upright but did have lean initially at EOB Postural control: Left lateral lean Standing balance support: Bilateral upper extremity supported;During functional activity Standing balance-Leahy Scale: Fair Standing balance comment: reliant on external support                             Pertinent Vitals/Pain Pain Assessment: Faces Faces Pain Scale: Hurts a little bit Pain Location: stomach upset Pain Descriptors / Indicators: Discomfort Pain Intervention(s): Limited activity within patient's tolerance;Monitored during session    Home Living Family/patient expects to be discharged to:: Private residence Living Arrangements: Alone Available Help at Discharge: Family;Available PRN/intermittently Type of Home: House  Home Access: Stairs to enter Entrance Stairs-Rails: Right Entrance Stairs-Number of Steps: 3 Home Layout: One level Home Equipment: Cane - single point Additional Comments: did not use AD    Prior Function Level of Independence:  Independent               Hand Dominance   Dominant Hand: Right    Extremity/Trunk Assessment   Upper Extremity Assessment Upper Extremity Assessment: RUE deficits/detail;LUE deficits/detail RUE Deficits / Details: grossly weak, approximately 4-/5 LUE Deficits / Details: 3/5 grossly LUE Sensation: decreased proprioception LUE Coordination: decreased gross motor;decreased fine motor    Lower Extremity Assessment Lower Extremity Assessment: RLE deficits/detail;LLE deficits/detail RLE Deficits / Details: gross weakness but generally 4-/5 to 4/5 LLE Deficits / Details: 3 to 3+/5 LLE Sensation: decreased proprioception LLE Coordination: decreased gross motor    Cervical / Trunk Assessment Cervical / Trunk Assessment: Kyphotic  Communication   Communication: No difficulties  Cognition Arousal/Alertness: Awake/alert Behavior During Therapy: Flat affect   Area of Impairment: Attention;Memory;Following commands;Safety/judgement;Problem solving                   Current Attention Level: Sustained Memory: Decreased short-term memory Following Commands: Follows one step commands with increased time;Follows one step commands consistently Safety/Judgement: Decreased awareness of deficits;Decreased awareness of safety   Problem Solving: Difficulty sequencing;Decreased initiation;Requires verbal cues;Slow processing;Requires tactile cues General Comments: cognition improved, however does require increased time for processing      General Comments General comments (skin integrity, edema, etc.): VSS on room air, orthostatics from sitting to standing negative; very weak and tremoulous, poor RAM testing L UE and seems to have L visual field cut    Exercises     Assessment/Plan    PT Assessment Patient needs continued PT services  PT Problem List Decreased strength;Decreased mobility;Decreased activity tolerance;Decreased balance;Pain;Decreased safety awareness;Decreased  knowledge of use of DME       PT Treatment Interventions DME instruction;Gait training;Stair training;Functional mobility training;Therapeutic activities;Therapeutic exercise;Balance training;Patient/family education    PT Goals (Current goals can be found in the Care Plan section)  Acute Rehab PT Goals Patient Stated Goal: get strong enough to go home PT Goal Formulation: With patient Time For Goal Achievement: 06/18/19 Potential to Achieve Goals: Fair    Frequency Min 3X/week   Barriers to discharge Decreased caregiver support lives alone and grandson can only check on her intermittently    Co-evaluation               AM-PAC PT "6 Clicks" Mobility  Outcome Measure Help needed turning from your back to your side while in a flat bed without using bedrails?: A Little Help needed moving from lying on your back to sitting on the side of a flat bed without using bedrails?: A Lot Help needed moving to and from a bed to a chair (including a wheelchair)?: A Lot Help needed standing up from a chair using your arms (e.g., wheelchair or bedside chair)?: A Little Help needed to walk in hospital room?: A Lot Help needed climbing 3-5 steps with a railing? : Total 6 Click Score: 13    End of Session Equipment Utilized During Treatment: Gait belt Activity Tolerance: Patient limited by fatigue Patient left: in bed;with call bell/phone within reach;with bed alarm set Nurse Communication: Mobility status;Other (comment)(updated recommendation for SNF) PT Visit Diagnosis: Muscle weakness (generalized) (M62.81);Difficulty in walking, not elsewhere classified (R26.2);Hemiplegia and hemiparesis Hemiplegia - Right/Left: Left Hemiplegia - dominant/non-dominant: Non-dominant Hemiplegia - caused by: Unspecified Pain -  Right/Left: (bilateral) Pain - part of body: (abdomen)    Time: 8835-8446 PT Time Calculation (min) (ACUTE ONLY): 25 min   Charges:   PT Evaluation $PT Re-evaluation: 1  Re-eval PT Treatments $Therapeutic Activity: 8-22 mins        Windell Norfolk, DPT, PN1   Supplemental Physical Therapist [place]    Pager [phone] Acute Rehab Office [phone]

## 2019-06-04 NOTE — Progress Notes (Signed)
MD notified of pt's left sided weakness including drift of lt leg as well as possible peripheral cuts in visual field. PT now recommends snf. Pt was previously from home alone prior to admission. Will continue to monitor the pt. Hoover Brunette, RN

## 2019-06-04 NOTE — Progress Notes (Addendum)
MD on call notified of pt's AM ammonia level being 124. Pt was refusing her lactulose but RN was able to educate & convince her to take it. PT called back to reevaluate pt prior to d/c. RN with the assistance of another person unable to get pt up to walk for pulse ox. Pt is on RA sats 96-99% while in chair. Will continue to monitor the pt. Hoover Brunette, RN

## 2019-06-05 ENCOUNTER — Inpatient Hospital Stay (HOSPITAL_COMMUNITY): Payer: Medicare Other

## 2019-06-05 LAB — CBC WITH DIFFERENTIAL/PLATELET
Abs Immature Granulocytes: 0.09 10*3/uL — ABNORMAL HIGH (ref 0.00–0.07)
Basophils Absolute: 0 10*3/uL (ref 0.0–0.1)
Basophils Relative: 0 %
Eosinophils Absolute: 0.2 10*3/uL (ref 0.0–0.5)
Eosinophils Relative: 2 %
HCT: 24.7 % — ABNORMAL LOW (ref 36.0–46.0)
Hemoglobin: 8.1 g/dL — ABNORMAL LOW (ref 12.0–15.0)
Immature Granulocytes: 1 %
Lymphocytes Relative: 10 %
Lymphs Abs: 1.5 10*3/uL (ref 0.7–4.0)
MCH: 25.9 pg — ABNORMAL LOW (ref 26.0–34.0)
MCHC: 32.8 g/dL (ref 30.0–36.0)
MCV: 78.9 fL — ABNORMAL LOW (ref 80.0–100.0)
Monocytes Absolute: 1.9 10*3/uL — ABNORMAL HIGH (ref 0.1–1.0)
Monocytes Relative: 12 %
Neutro Abs: 12.5 10*3/uL — ABNORMAL HIGH (ref 1.7–7.7)
Neutrophils Relative %: 75 %
Platelets: 235 10*3/uL (ref 150–400)
RBC: 3.13 MIL/uL — ABNORMAL LOW (ref 3.87–5.11)
RDW: 15.5 % (ref 11.5–15.5)
WBC: 16.3 10*3/uL — ABNORMAL HIGH (ref 4.0–10.5)
nRBC: 0 % (ref 0.0–0.2)

## 2019-06-05 LAB — COMPREHENSIVE METABOLIC PANEL
ALT: 45 U/L — ABNORMAL HIGH (ref 0–44)
AST: 111 U/L — ABNORMAL HIGH (ref 15–41)
Albumin: 1.7 g/dL — ABNORMAL LOW (ref 3.5–5.0)
Alkaline Phosphatase: 129 U/L — ABNORMAL HIGH (ref 38–126)
Anion gap: 12 (ref 5–15)
BUN: 32 mg/dL — ABNORMAL HIGH (ref 8–23)
CO2: 25 mmol/L (ref 22–32)
Calcium: 8.4 mg/dL — ABNORMAL LOW (ref 8.9–10.3)
Chloride: 102 mmol/L (ref 98–111)
Creatinine, Ser: 2.31 mg/dL — ABNORMAL HIGH (ref 0.44–1.00)
GFR calc Af Amer: 23 mL/min — ABNORMAL LOW (ref >60)
GFR calc non Af Amer: 20 mL/min — ABNORMAL LOW (ref >60)
Glucose, Bld: 94 mg/dL (ref 70–99)
Potassium: 3.8 mmol/L (ref 3.5–5.1)
Sodium: 139 mmol/L (ref 135–145)
Total Bilirubin: 1.5 mg/dL — ABNORMAL HIGH (ref 0.3–1.2)
Total Protein: 6.5 g/dL (ref 6.5–8.1)

## 2019-06-05 LAB — SARS CORONAVIRUS 2 (TAT 6-24 HRS): SARS Coronavirus 2: NEGATIVE

## 2019-06-05 LAB — GLUCOSE, CAPILLARY
Glucose-Capillary: 139 mg/dL — ABNORMAL HIGH (ref 70–99)
Glucose-Capillary: 108 mg/dL — ABNORMAL HIGH (ref 70–99)

## 2019-06-05 LAB — AMMONIA: Ammonia: 33 umol/L (ref 9–35)

## 2019-06-05 MED ORDER — FUROSEMIDE 10 MG/ML IJ SOLN
40.0000 mg | Freq: Once | INTRAMUSCULAR | Status: AC
  Administered 2019-06-05: 40 mg via INTRAVENOUS
  Filled 2019-06-05: qty 4

## 2019-06-05 NOTE — Progress Notes (Signed)
Nutrition Follow-up  INTERVENTION:   -Ensure Enlive po daily each supplement provides 350 kcal and 20 grams of protein  -If pt not eating well, recommend diet liberalization  NUTRITION DIAGNOSIS:   Increased nutrient needs related to chronic illness as evidenced by estimated needs.  Ongoing.  GOAL:   Patient will meet greater than or equal to 90% of their needs  Progressing.  MONITOR:   PO intake, Labs, Weight trends, I & O's  ASSESSMENT:   73 year old female with history of systolic CHF/NICM (EF 10 to 15%), VT s/p AICD 2014 and biventricular PPM, paroxysmal A. fib/bradycardia, chronic anemia, CKD-4, HTN, HLD, moderate protein calorie malnutrition and GIB, prolonged QT/torsade and multiple hospitalizations presented to ED 11/38/20 with abdominal pain, nausea, vomiting, shortness of breath and lower extremity edema.  **RD working remotely**  Patient currently consuming 0-75% of meals. Pt's diet was changed to renal diet on 12/3. If pt not eating well on renal diet, may need to liberalize.   Admission weight: 132 lbs. Current weight: 136 lbs.  I/Os: -880 ml since admit UOP: 100 ml x 24 hrs  Medications: Fergon tablet, IV Lasix once, Lasix tablet, MAG-OX tablet, Carafate Labs reviewed: CBGs: 139  Diet Order:   Diet Order            Diet renal/carb modified with fluid restriction Diet-HS Snack? Nothing; Fluid restriction: 1200 mL Fluid; Room service appropriate? Yes; Fluid consistency: Thin  Diet effective now              EDUCATION NEEDS:   Education needs have been addressed  Skin:  Skin Assessment: Skin Integrity Issues: Skin Integrity Issues:: Stage I, Stage II Stage I: right buttocks Stage II: left buttocks  Last BM:  12/8 -type 6  Height:   Ht Readings from Last 1 Encounters:  05/27/19 5' (1.524 m)    Weight:   Wt Readings from Last 1 Encounters:  06/05/19 61.9 kg    Ideal Body Weight:  45.4 kg  BMI:  Body mass index is 26.65  kg/m.  Estimated Nutritional Needs:   Kcal:  1500-1700  Protein:  60-70g  Fluid:  1.5L/day  Clayton Bibles, MS, RD, LDN Inpatient Clinical Dietitian Pager: [phone] After Hours Pager: [phone]

## 2019-06-05 NOTE — TOC Initial Note (Signed)
Transition of Care [facility]) - Initial/Assessment Note    Patient Details  Name: Stephanie Yates MRN: [id number] Date of Birth: 06-24-1946  Transition of Care [facility]) CM/SW Contact:    Eileen Stanford, LCSW Phone Number: 06/05/2019, 3:41 PM  Clinical Narrative:  Pt lives at home alone. Pt is agreeable to SNF. Pt would like to go to [place]. CSW will initiate insurance auth. Pt will need updated COVID.                 Expected Discharge Plan: Skilled Nursing Facility Barriers to Discharge: Insurance Authorization   Patient Goals and CMS Choice Patient states their goals for this hospitalization and ongoing recovery are:: "to get better"   Choice offered to / list presented to : Patient  Expected Discharge Plan and Services Expected Discharge Plan: [place] In-house Referral: NA Discharge Planning Services: CM Consult Post Acute Care Choice: [place] Living arrangements for the past 2 months: Single Family Home                           HH Arranged: NA [place] Agency: [facility] Date [place]: 05/27/19 Time HH Agency Contacted: [id number] Representative spoke with at [place]: Cassie  Prior Living Arrangements/Services Living arrangements for the past 2 months: [place] with:: Self Patient language and need for interpreter reviewed:: Yes Do you feel safe going back to the place where you live?: Yes      Need for Family Participation in Patient Care: Yes (Comment) Care giver support system in place?: Yes (comment) Current home services: Home PT, Home RN([facility]) Criminal Activity/Legal Involvement Pertinent to Current Situation/Hospitalization: No - Comment as needed  Activities of Daily Living Home Assistive Devices/Equipment: Walker (specify type) ADL Screening (condition at time of admission) Patient's cognitive ability adequate to safely complete daily activities?: Yes Is the patient deaf or have  difficulty hearing?: No Does the patient have difficulty seeing, even when wearing glasses/contacts?: Yes(wears glasses) Does the patient have difficulty concentrating, remembering, or making decisions?: No Patient able to express need for assistance with ADLs?: Yes Does the patient have difficulty dressing or bathing?: No Independently performs ADLs?: Yes (appropriate for developmental age) Does the patient have difficulty walking or climbing stairs?: No Weakness of Legs: Right Weakness of Arms/Hands: None  Permission Sought/Granted Permission sought to share information with : Family Supports Permission granted to share information with : Yes, Verbal Permission Granted  Share Information with NAME: Magda Paganini  Permission granted to share info w AGENCY: [facility]  Permission granted to share info w Relationship: Daughter     Emotional Assessment Appearance:: Appears stated age Attitude/Demeanor/Rapport: Engaged Affect (typically observed): Accepting, Appropriate, Calm Orientation: : Oriented to Situation, Oriented to  Time, Oriented to Place, Oriented to Self Alcohol / Substance Use: Not Applicable Psych Involvement: No (comment)  Admission diagnosis:  Generalized abdominal pain [R10.84] Anemia, unspecified type [D64.9] Acute on chronic congestive heart failure, unspecified heart failure type [facility]) [I50.9] Patient Active Problem List   Diagnosis Date Noted  . Pressure injury of skin 05/29/2019  . Acute on chronic systolic CHF (congestive heart failure) ([place]) 05/27/2019  . Torsades de pointes ([place]) 05/14/2019  . Weakness generalized   . Palliative care by specialist   . Gallbladder mass 05/06/2019  . Volume overload 05/06/2019  . Vertigo 04/15/2019  . Abnormal liver function 04/15/2019  . Right adrenal mass ([place]) possible adenoma 03/18/2019  . Nephrolithiasis 03/18/2019  .  Prolonged QT interval 03/17/2019  . Renal failure (ARF), acute on chronic ([place]) 03/17/2019  . Nausea and  vomiting 03/17/2019  . Epigastric abdominal pain 01/29/2019  . Gastritis 01/28/2019  . CKD (chronic kidney disease) stage 4, GFR 15-29 ml/min (HCC) 01/28/2019  . NSVT (nonsustained ventricular tachycardia) ([place]) 01/17/2019  . AKI (acute kidney injury) ([place]) 01/12/2019  . DNR (do not resuscitate) discussion   . CHF exacerbation ([place]) 01/06/2019  . Influenza A 09/02/2018  . Lactic acidosis 09/02/2018  . Chronic combined systolic and diastolic congestive heart failure ([place]) 09/02/2018  . Rapid atrial fibrillation ([place]) 06/08/2018  . Acute on chronic combined systolic and diastolic heart failure ([place]) 06/07/2018  . Anemia 06/07/2018  . Acute GI bleeding 06/15/2017  . Chronic anticoagulation   . Acute on chronic congestive heart failure ([place])   . HLD (hyperlipidemia) 03/05/2017  . CKD (chronic kidney disease), stage III 03/05/2017  . Chest pain 03/05/2017  . Symptomatic anemia 03/05/2017  . PAF (paroxysmal atrial fibrillation) ([place]) 04/01/2013  . ICD (implantable cardioverter-defibrillator), dual, [place] 04/01/2013  . PVC's (premature ventricular contractions) 04/01/2013  . Hypokalemia 12/21/2012  . History of tobacco abuse 12/21/2012  . Transaminitis 12/21/2012  . Sinus bradycardia 12/21/2012  . Hyperkalemia 12/20/2012  . Nonischemic cardiomyopathy ([place]) 12/20/2012  . UTI (urinary tract infection) 12/20/2012  . Acute on chronic combined systolic and diastolic CHF (congestive heart failure) ([place]) 12/20/2012  . Ventricular tachycardia ([place]) 12/14/2012  . Hypertension 12/14/2012   PCP:  Josetta Huddle, MD Pharmacy:   [facility] [place], [place] - [place] [facility] [place] [address] [place] [address] Phone: [phone] Fax: [phone]     Social Determinants of Health (SDOH) Interventions    Readmission Risk Interventions Readmission Risk Prevention Plan 06/05/2019 05/27/2019 05/10/2019  Transportation  Screening Complete Complete Complete  PCP or Specialist Appt within 3-5 Days - - -  HRI or [place] for [place] - - -  Medication Review Press photographer) Complete Complete Complete  Med Review Comments - - -  PCP or Specialist appointment within 3-5 days of discharge Complete Complete Complete  HRI or Home Care Consult Complete Complete Complete  SW Recovery Care/Counseling Consult Complete Complete Complete  Palliative Care Screening Not Applicable Not Applicable Not Applicable  Skilled Nursing Facility Complete Not Applicable Not Applicable  Some recent data might be hidden

## 2019-06-05 NOTE — Progress Notes (Signed)
Pt reluctant to take lactulose stating "I don't want to keep messing the bed." I offered to place a fecal pouch in order to help prevent this, pt agreed to this and to take her lactulose.

## 2019-06-05 NOTE — NC FL2 (Signed)
[place] LEVEL OF CARE SCREENING TOOL     IDENTIFICATION  Patient Name: Stephanie Yates Birthdate: [birth year] Sex: female Admission Date (Current Location): 05/26/2019  [facility] [place] Number:  Herbalist and Address:  The . [facility], [place] [address], [place], [place] [address]      Provider Number: [id number]  Attending Physician Name and Address:  Darliss Cheney, MD  Relative Name and Phone Number:       Current Level of Care: Hospital Recommended Level of Care: [place] Prior Approval Number:    Date Approved/Denied:   PASRR Number: [id number]  Discharge Plan: SNF    Current Diagnoses: Patient Active Problem List   Diagnosis Date Noted  . Pressure injury of skin 05/29/2019  . Acute on chronic systolic CHF (congestive heart failure) ([place]) 05/27/2019  . Torsades de pointes ([place]) 05/14/2019  . Weakness generalized   . Palliative care by specialist   . Gallbladder mass 05/06/2019  . Volume overload 05/06/2019  . Vertigo 04/15/2019  . Abnormal liver function 04/15/2019  . Right adrenal mass ([place]) possible adenoma 03/18/2019  . Nephrolithiasis 03/18/2019  . Prolonged QT interval 03/17/2019  . Renal failure (ARF), acute on chronic ([place]) 03/17/2019  . Nausea and vomiting 03/17/2019  . Epigastric abdominal pain 01/29/2019  . Gastritis 01/28/2019  . CKD (chronic kidney disease) stage 4, GFR 15-29 ml/min (HCC) 01/28/2019  . NSVT (nonsustained ventricular tachycardia) ([place]) 01/17/2019  . AKI (acute kidney injury) ([place]) 01/12/2019  . DNR (do not resuscitate) discussion   . CHF exacerbation ([place]) 01/06/2019  . Influenza A 09/02/2018  . Lactic acidosis 09/02/2018  . Chronic combined systolic and diastolic congestive heart failure ([place]) 09/02/2018  . Rapid atrial fibrillation ([place]) 06/08/2018  . Acute on chronic combined systolic and diastolic heart failure ([place]) 06/07/2018  . Anemia 06/07/2018  . Acute GI  bleeding 06/15/2017  . Chronic anticoagulation   . Acute on chronic congestive heart failure ([place])   . HLD (hyperlipidemia) 03/05/2017  . CKD (chronic kidney disease), stage III 03/05/2017  . Chest pain 03/05/2017  . Symptomatic anemia 03/05/2017  . PAF (paroxysmal atrial fibrillation) ([place]) 04/01/2013  . ICD (implantable cardioverter-defibrillator), dual, [place] 04/01/2013  . PVC's (premature ventricular contractions) 04/01/2013  . Hypokalemia 12/21/2012  . History of tobacco abuse 12/21/2012  . Transaminitis 12/21/2012  . Sinus bradycardia 12/21/2012  . Hyperkalemia 12/20/2012  . Nonischemic cardiomyopathy ([place]) 12/20/2012  . UTI (urinary tract infection) 12/20/2012  . Acute on chronic combined systolic and diastolic CHF (congestive heart failure) ([place]) 12/20/2012  . Ventricular tachycardia ([place]) 12/14/2012  . Hypertension 12/14/2012    Orientation RESPIRATION BLADDER Height & Weight     Self, Time, Situation, Place  O2([place], 4L) Incontinent Weight: 136 lb 7.4 oz (61.9 kg) Height:  5' (152.4 cm)  BEHAVIORAL SYMPTOMS/MOOD NEUROLOGICAL BOWEL NUTRITION STATUS      Incontinent Diet(renal/carb modified with fluid restriction: 1200 mL Fluid)  AMBULATORY STATUS COMMUNICATION OF NEEDS Skin   Extensive Assist Verbally PU Stage and Appropriate Care PU Stage 1 Dressing: (buttocks, foam dressing, change PRN) PU Stage 2 Dressing: (buttocks, foam dressing, change PRN)                   Personal Care Assistance Level of Assistance  Bathing, Dressing, Feeding Bathing Assistance: Maximum assistance Feeding assistance: Independent Dressing Assistance: Maximum assistance     Functional Limitations Info  Sight, Hearing, Speech Sight Info: Adequate Hearing Info: Adequate Speech Info: Adequate  SPECIAL CARE FACTORS FREQUENCY  PT (By licensed PT), OT (By licensed OT)     PT Frequency: 5x OT Frequency: 5x            Contractures Contractures Info: Not present     Additional Factors Info  Code Status, Allergies Code Status Info: Partial Allergies Info: Strawberry extract           Current Medications (06/05/2019):  This is the current hospital active medication list Current Facility-Administered Medications  Medication Dose Route Frequency Provider Last Rate Last Dose  . 0.9 %  sodium chloride infusion  250 mL Intravenous PRN Bensimhon, Shaune Pascal, MD      . 0.9 %  sodium chloride infusion  250 mL Intravenous PRN Bensimhon, Shaune Pascal, MD      . acetaminophen (TYLENOL) tablet 650 mg  650 mg Oral Q4H PRN Bensimhon, Shaune Pascal, MD   650 mg at 05/30/19 2252  . albuterol (PROVENTIL) (2.5 MG/3ML) 0.083% nebulizer solution 2.5 mg  2.5 mg Inhalation Q4H PRN Bensimhon, Shaune Pascal, MD   2.5 mg at 06/05/19 1120  . amiodarone (PACERONE) tablet 200 mg  200 mg Oral BID Bensimhon, Shaune Pascal, MD   200 mg at 06/05/19 0920  . apixaban (ELIQUIS) tablet 2.5 mg  2.5 mg Oral BID Bensimhon, Shaune Pascal, MD   2.5 mg at 06/05/19 0920  . atorvastatin (LIPITOR) tablet 40 mg  40 mg Oral q1800 Bensimhon, Shaune Pascal, MD   40 mg at 06/04/19 1717  . carvedilol (COREG) tablet 3.125 mg  3.125 mg Oral BID WC Bensimhon, Shaune Pascal, MD   3.125 mg at 06/05/19 0920  . feeding supplement (ENSURE ENLIVE) (ENSURE ENLIVE) liquid 237 mL  237 mL Oral Q24H Bensimhon, Shaune Pascal, MD   237 mL at 06/05/19 1334  . ferrous gluconate (FERGON) tablet 324 mg  324 mg Oral BID Bensimhon, Shaune Pascal, MD   324 mg at 06/05/19 0921  . furosemide (LASIX) tablet 40 mg  40 mg Oral Daily Clegg, Amy D, NP   40 mg at 06/05/19 0920  . insulin aspart (novoLOG) injection 0-6 Units  0-6 Units Subcutaneous TID WC Rosalin Hawking, MD   1 Units at 06/03/19 1255  . lactulose (CHRONULAC) 10 GM/15ML solution 30 g  30 g Oral TID Darliss Cheney, MD   30 g at 06/04/19 2252  . loratadine (CLARITIN) tablet 10 mg  10 mg Oral Daily Wendee Beavers T, MD   10 mg at 06/05/19 0920  . magnesium oxide (MAG-OX) tablet 400 mg  400 mg Oral Daily Bensimhon, Shaune Pascal, MD   400 mg at 06/05/19 0920  . mexiletine (MEXITIL) capsule 200 mg  200 mg Oral BID Bensimhon, Shaune Pascal, MD   200 mg at 06/05/19 0920  . pantoprazole (PROTONIX) EC tablet 40 mg  40 mg Oral Daily Bensimhon, Shaune Pascal, MD   40 mg at 06/05/19 0920  . promethazine (PHENERGAN) injection 12.5 mg  12.5 mg Intravenous Q8H PRN Bensimhon, Shaune Pascal, MD   12.5 mg at 06/02/19 0554  . sodium chloride (OCEAN) 0.65 % nasal spray 1 spray  1 spray Each Nare PRN Gonfa, Taye T, MD      . sodium chloride flush (NS) 0.9 % injection 3 mL  3 mL Intravenous Q12H Bensimhon, Shaune Pascal, MD   3 mL at 05/31/19 2232  . sodium chloride flush (NS) 0.9 % injection 3 mL  3 mL Intravenous PRN Bensimhon, Shaune Pascal, MD      . sodium  chloride flush (NS) 0.9 % injection 3 mL  3 mL Intravenous Q12H Bensimhon, Shaune Pascal, MD   3 mL at 06/02/19 1036  . sodium chloride flush (NS) 0.9 % injection 3 mL  3 mL Intravenous Q12H Bensimhon, Shaune Pascal, MD   3 mL at 06/04/19 2306  . sodium chloride flush (NS) 0.9 % injection 3 mL  3 mL Intravenous PRN Bensimhon, Shaune Pascal, MD      . sucralfate (CARAFATE) 1 GM/10ML suspension 1 g  1 g Oral TID WC & HS Bensimhon, Shaune Pascal, MD   1 g at 06/05/19 1333     Discharge Medications: Please see discharge summary for a list of discharge medications.  Relevant Imaging Results:  Relevant Lab Results:   Additional Information [id number]  Gerrianne Scale Jabri Blancett, LCSW

## 2019-06-05 NOTE — Progress Notes (Signed)
PROGRESS NOTE  Stephanie Yates:[id number] DOB: Sep 28, 1945   PCP: Josetta Huddle, MD  Patient is from: Home  DOA: 05/26/2019 LOS: 9  Brief Narrative / Interim history: 73 year old female with history of systolic CHF/NICM (EF 10 to 15%), VT s/p AICD 2014 and biventricular PPM, paroxysmal A. fib/bradycardia, chronic anemia, CKD-4, HTN, HLD, moderate protein calorie malnutrition and GIB, prolonged QT/torsade and multiple hospitalizations presented to ED 11/38/20 with abdominal pain, nausea, vomiting, shortness of breath and lower extremity edema.   In ED, HDS.  W 16.  Hgb 7.7 (baseline 8-9).  Creatinine 2.0 (about baseline).  BNP 1587 (slightly higher than baseline).  CXR concerning for CHF.  CT abdomen and pelvis with right large inguinal hernia without obstruction or incarceration.  FOBT positive.  She was admitted for CHF exacerbation and GI bleed.  Started on IV diuretics.  Will give GI consulted.  Patient was evaluated by Dr. Ethlyn Daniels from [facility].  GI did not feel EGD or colonoscopy is necessary as she had several of them in the last 2 months.  GI suggested intermittent blood and iron transfusion unless rampant overt bleeding in which case she would need transfer to tertiary center given his significant cardiac history.  Advanced heart failure team consulted 05/28/2019.  Started on higher dose IV Lasix.  Patient had [place] on 05/30/2019 that revealed moderately elevated filling pressure with normal cardiac output.   On 12/3, patient developed dizziness that she described as tenderness moving up and down.  CT head without contrast obtained but no acute finding. Neuro exam although limited was not impressive except for generalized weakness.  She was given meclizine x1.  Of note, patient has history of dizziness for the last 3 months per patient's daughter.  On 05/31/2019, had some ataxia, left-sided arm weakness and dysarthria when evaluated by PT.  Code stroke activated.  Stat CT head, CTA head and  neck obtained but not significant finding.  EEG without epileptiform discharge.  Ammonia elevated to 105.  Started on lactulose.  Patient is not a candidate for MRI due to noncompatible AICD.  Assessment & Plan: Stroke-like symptoms/hepatic encephalopathy: Her ammonia is back to normal today.  When I saw her earlier today, she was completely alert and oriented.  When I saw her again at around noon, she was slightly lethargic.  Initial CT head, and CTA head and neck done on 05/31/2019 without acute finding.  Neuro exam not convincing for CVA.  -Repeat CT head from 06/01/2019 again unremarkable.  Once again on 06/04/2019, when seen by PT, she was found to have drift of left leg and possible peripheral visual loss.  Due to these findings, repeat CT scan was done on 06/04/2019 which was once again unremarkable or negative for any acute stroke with no change from recent CT.  AICD not compatible with MRI.   UA not impressive.    Acute on chronic systolic CHF/NICM: NYHA IIIb-IV.  most recent echo with EF of 10 to 15%.  Per advanced HF team, patient declined palliative care/hospice in the past.  Patient has cardinal symptoms including dyspnea, orthopnea and PND.  Total output 100 mL in last 24 hours but creatinine stable. -RHC on 12/3 with moderately elevated filling pressure with normal cardiac output.  -Advanced HF managing-on p.o. Lasix 40 mg daily.  I was paged by RN right before noon that patient was more hypoxic and noted to have tachypnea and crackles were heard on nurse examination.  I personally went in to see patient again  and I also heard some crackles.  40 mg IV additional Lasix was given.  Chest x-ray was done which interestingly does not read as vascular congestion however when I personally reviewed it, I do see there is vascular congestion. -Limited on options for GDMT due to CKD.  Due to complaint of dizziness and hypotension, hydralazine was discontinued on 06/03/2019.  She remains on low-dose Coreg.   Blood pressure fairly controlled.  PT recommends SNF.  Case manager aware.  History of recurrent VT s/p AICD -Continue telemetry monitoring -Optimize K and Mg  Paroxysmal A. Fib/bradycardia -Coreg as above.  Rate controlled. -On Eliquis for anticoagulation prior to admission but this has been on hold due to GI bleeding.  Acute on chronic blood loss anemia in patient with GI bleed and chronic kidney disease Acute on chronic GI bleed-FOBT positive.  No scope per GI.  Consider tertiary center if significant bleeding.  GI signed off.  Anemia panel normal. -Hgb 8-9 (baseline)> 7.7 (admit)>> 7.7> 7.8> 8.3> 7.5> 8.2> 7.7> 8.1 -Continue PPI -Monitor H&H daily.  CKD-4: Stable. -Continue monitoring  Leukocytosis: No obvious source of infection.  She has abdominal tenderness on exam.  Initial CT abdomen and pelvis reassuring and so was repeat CT abdomen done on 06/01/2019.  CT head with chronic sinusitis.  CXR consistent with CHF.  UA not impressive.  White cells slightly worse today but she remains afebrile and no other symptoms other than abdominal pain.  Abdominal pain/tenderness: could be due to CHF, large right inguinal hernia and constipation.  Initial CT A/P reassuring and so a CT abdomen and pelvis debated on 06/01/2019.  Essential hypertension: Fairly controlled.  Continue carvedilol.  Continue to hold hydralazine.  Mild hyperkalemia: Resolved. -Continue monitoring  Constipation: Disimpacted 12/2. -Continue lactulose.   Pressure Injury 05/29/19 Buttocks Left;Right Stage I -  Intact skin with non-blanchable redness of a localized area usually over a bony prominence. (Active)  05/29/19 1105  Location: Buttocks  Location Orientation: Left;Right  Staging: Stage I -  Intact skin with non-blanchable redness of a localized area usually over a bony prominence.  Wound Description (Comments):   Present on Admission: Yes     Pressure Injury 05/31/19 Buttocks Left Stage II -  Partial  thickness loss of dermis presenting as a shallow open ulcer with a red, pink wound bed without slough. (Active)  05/31/19 0800  Location: Buttocks  Location Orientation: Left  Staging: Stage II -  Partial thickness loss of dermis presenting as a shallow open ulcer with a red, pink wound bed without slough.  Wound Description (Comments):   Present on Admission:     Moderate protein calorie malnutrition: -Dietitian consulted. Nutrition Problem: Increased nutrient needs Etiology: chronic illness  Signs/Symptoms: estimated needs  Interventions: Education   DVT prophylaxis: SCD Code Status: Partial.  No intubation. Family Communication: No family present at bedside.  Patient lives by herself. Disposition Plan: Remains inpatient for CHF and hepatic encephalopathy.  PT now recommend SNF.  Case manager aware.  Will be discharged when placement arranged. Consultants: GI (signed off), advanced HF, neurology   Subjective: Patient seen and examined twice today.  When I saw her this morning around 9:30 AM.  She was alert and oriented although continues to complain of not feeling well but she did not have any specific complaint.  I was then paged by RN right before noon that she is more hypoxic requiring 4 L nasal can oxygen while she was requiring only 1 L of nasal oxygen earlier  and that on nurses examination, she had crackles.  I went in to see patient and she did look lethargic compared to this morning.  On lung examination, she had crackles.  Stat x-ray was done which interestingly does not read as vascular congestion.  Patient was given another dose of Lasix 40 mg IV prior to x-ray resulted.  Objective: Vitals:   06/04/19 1645 06/04/19 1938 06/05/19 0004 06/05/19 0419  BP: (!) 111/44 (!) 107/54 (!) 108/41 (!) 132/49  Pulse: 61 60 (!) 59 72  Resp: 16     Temp: 98 F (36.7 C) 98.5 F (36.9 C) 98.8 F (37.1 C) 98 F (36.7 C)  TempSrc: Oral Oral Oral Oral  SpO2: 100% 100% 99% 100%   Weight:    61.9 kg  Height:        Intake/Output Summary (Last 24 hours) at 06/05/2019 0749 Last data filed at 06/04/2019 1717 Gross per 24 hour  Intake 480 ml  Output 100 ml  Net 380 ml   Filed Weights   06/02/19 0436 06/04/19 0444 06/05/19 0419  Weight: 61 kg 63.3 kg 61.9 kg    Examination:  General exam: Appears lethargic Respiratory system: Crackles bilaterally. Respiratory effort normal. Cardiovascular system: S1 & S2 heard, RRR. No JVD, murmurs, rubs, gallops or clicks. No pedal edema. Gastrointestinal system: Abdomen is nondistended, soft and mild to moderate generalized tenderness. No organomegaly or masses felt. Normal bowel sounds heard. Central nervous system: Alert and oriented. No focal neurological deficits. Extremities: Symmetric 5 x 5 power. Skin: No rashes, lesions or ulcers.  Psychiatry: Judgement and insight appear poor. Mood & affect flat.  Procedures:  12/3-RHC with moderately elevated filling pressure with normal cardiac output.   Microbiology summarized: COVID-19 negative  Sch Meds:  Scheduled Meds: . amiodarone  200 mg Oral BID  . apixaban  2.5 mg Oral BID  . atorvastatin  40 mg Oral q1800  . carvedilol  3.125 mg Oral BID WC  . feeding supplement (ENSURE ENLIVE)  237 mL Oral Q24H  . ferrous gluconate  324 mg Oral BID  . furosemide  40 mg Oral Daily  . insulin aspart  0-6 Units Subcutaneous TID WC  . lactulose  30 g Oral TID  . loratadine  10 mg Oral Daily  . magnesium oxide  400 mg Oral Daily  . mexiletine  200 mg Oral BID  . pantoprazole  40 mg Oral Daily  . sodium chloride flush  3 mL Intravenous Q12H  . sodium chloride flush  3 mL Intravenous Q12H  . sodium chloride flush  3 mL Intravenous Q12H  . sucralfate  1 g Oral TID WC & HS   Continuous Infusions: . sodium chloride    . sodium chloride     PRN Meds:.sodium chloride, sodium chloride, acetaminophen, albuterol, promethazine, sodium chloride, sodium chloride flush, sodium chloride  flush  Antimicrobials: Anti-infectives (From admission, onward)   None       I have personally reviewed the following labs and images: CBC: Recent Labs  Lab 05/29/19 1430  06/01/19 0317 06/02/19 0419 06/03/19 0431 06/04/19 0334 06/05/19 0243  WBC 19.0*   < > 17.4* 17.6* 22.2* 15.4* 16.3*  NEUTROABS 14.9*  --   --  13.8* 16.6* 11.6* 12.5*  HGB 8.5*   < > 8.3* 7.5* 8.2* 7.7* 8.1*  HCT 26.2*   < > 25.5* 23.2* 24.7* 22.6* 24.7*  MCV 79.9*   < > 78.7* 78.1* 76.5* 76.6* 78.9*  PLT 333   < >  287 266 308 247 235   < > = values in this interval not displayed.   BMP &GFR Recent Labs  Lab 05/30/19 0406  05/31/19 0324 06/01/19 0317 06/02/19 0419 06/03/19 0431 06/04/19 0334 06/05/19 0243  NA 138   < > 136 138 139 137 138 139  K 4.3   < > 4.3 5.0 3.9 4.0 3.9 3.8  CL 105  --  105 105 105 100 102 102  CO2 25  --  24 23 26 25 25 25   GLUCOSE 84  --  98 74 101* 116* 98 94  BUN 33*  --  34* 37* 35* 35* 34* 32*  CREATININE 1.96*   < > 2.07* 2.04* 2.12* 2.36* 2.35* 2.31*  CALCIUM 8.1*  --  8.2* 8.1* 8.3* 8.4* 8.2* 8.4*  MG 2.1  --  2.2 2.3 2.3 2.4  --   --    < > = values in this interval not displayed.   Estimated Creatinine Clearance: 17.3 mL/min (A) (by C-G formula based on SCr of 2.31 mg/dL (H)). Liver & Pancreas: Recent Labs  Lab 05/29/19 1430 06/01/19 0317 06/03/19 0431 06/04/19 0334 06/05/19 0243  AST 85* 92* 92* 114* 111*  ALT 38 39 40 42 45*  ALKPHOS 100 105 122 132* 129*  BILITOT 1.1 1.2 1.3* 1.3* 1.5*  PROT 7.3 6.5 6.8 6.2* 6.5  ALBUMIN 2.1* 2.0* 1.9* 1.7* 1.7*   No results for input(s): LIPASE, AMYLASE in the last 168 hours. Recent Labs  Lab 06/01/19 0948 06/02/19 0419 06/03/19 0433 06/04/19 0642 06/05/19 0444  AMMONIA 79* 50* 79* 124* 33   Diabetic: No results for input(s): HGBA1C in the last 72 hours. Recent Labs  Lab 06/03/19 2114 06/04/19 0842 06/04/19 1155 06/04/19 1642 06/04/19 2108  GLUCAP 108* 99 105* 118* 109*   Cardiac Enzymes:  Recent Labs  Lab 05/30/19 0406  CKTOTAL 21*   No results for input(s): PROBNP in the last 8760 hours. Coagulation Profile: No results for input(s): INR, PROTIME in the last 168 hours. Thyroid Function Tests: No results for input(s): TSH, T4TOTAL, FREET4, T3FREE, THYROIDAB in the last 72 hours. Lipid Profile: No results for input(s): CHOL, HDL, LDLCALC, TRIG, CHOLHDL, LDLDIRECT in the last 72 hours. Anemia Panel: No results for input(s): VITAMINB12, FOLATE, FERRITIN, TIBC, IRON, RETICCTPCT in the last 72 hours. Urine analysis:    Component Value Date/Time   COLORURINE AMBER (A) 06/01/2019 1954   APPEARANCEUR CLOUDY (A) 06/01/2019 1954   LABSPEC 1.020 06/01/2019 1954   PHURINE 5.0 06/01/2019 1954   GLUCOSEU NEGATIVE 06/01/2019 1954   HGBUR SMALL (A) 06/01/2019 [place] NEGATIVE 06/01/2019 [place] NEGATIVE 06/01/2019 1954   PROTEINUR 30 (A) 06/01/2019 1954   UROBILINOGEN 1.0 08/29/2014 2035   NITRITE NEGATIVE 06/01/2019 1954   LEUKOCYTESUR TRACE (A) 06/01/2019 1954   Sepsis Labs: Invalid input(s): PROCALCITONIN, [place]  Microbiology: Recent Results (from the past 240 hour(s))  Culture, Urine     Status: Abnormal   Collection Time: 06/01/19  7:41 PM   Specimen: Urine, Clean Catch  Result Value Ref Range Status   Specimen Description URINE, CLEAN CATCH  Final   Special Requests   Final    NONE Performed at [facility], [address] [place],  [address]    Culture >=100,000 COLONIES/mL KLEBSIELLA PNEUMONIAE (A)  Final   Report Status 06/04/2019 FINAL  Final   Organism ID, Bacteria KLEBSIELLA PNEUMONIAE (A)  Final      Susceptibility  Klebsiella pneumoniae - MIC*    AMPICILLIN RESISTANT Resistant     CEFAZOLIN <=4 SENSITIVE Sensitive     CEFTRIAXONE <=1 SENSITIVE Sensitive     CIPROFLOXACIN <=0.25 SENSITIVE Sensitive     GENTAMICIN <=1 SENSITIVE Sensitive     IMIPENEM <=0.25 SENSITIVE Sensitive     NITROFURANTOIN <=16 SENSITIVE  Sensitive     TRIMETH/SULFA <=20 SENSITIVE Sensitive     AMPICILLIN/SULBACTAM <=2 SENSITIVE Sensitive     PIP/TAZO <=4 SENSITIVE Sensitive     Extended ESBL NEGATIVE Sensitive     * >=100,000 COLONIES/mL KLEBSIELLA PNEUMONIAE    Radiology Studies: Ct Head Wo Contrast  Result Date: 06/04/2019 CLINICAL DATA:  Left-sided weakness EXAM: CT HEAD WITHOUT CONTRAST TECHNIQUE: Contiguous axial images were obtained from the base of the skull through the vertex without intravenous contrast. COMPARISON:  06/01/2019 FINDINGS: Brain: There is no acute intracranial hemorrhage, mass-effect, or edema. There is no new loss of gray-white differentiation. Patchy hypoattenuation in the supratentorial white matter is nonspecific but likely reflects stable chronic microvascular ischemic changes. There is no extra-axial fluid collection. Ventricles and sulci are stable in size and configuration. Vascular: There is atherosclerotic calcification at the skull base. Skull: Calvarium is unremarkable. Sinuses/Orbits: Persistent diffuse polypoid mucosal thickening with areas of increased density likely reflecting inspissation. Other: None. IMPRESSION: No significant change since 06/01/2019. Electronically Signed   By: Macy Mis M.D.   On: 06/04/2019 14:21   Total time spent 34 minutes Darliss Cheney, MD [facility]  If 7PM-7AM, please contact night-coverage [email or web address] Password [facility] 06/05/2019, 7:49 AM

## 2019-06-06 ENCOUNTER — Inpatient Hospital Stay (HOSPITAL_COMMUNITY): Payer: Medicare Other

## 2019-06-06 LAB — CBC WITH DIFFERENTIAL/PLATELET
Abs Immature Granulocytes: 0.09 10*3/uL — ABNORMAL HIGH (ref 0.00–0.07)
Abs Immature Granulocytes: 0.13 10*3/uL — ABNORMAL HIGH (ref 0.00–0.07)
Basophils Absolute: 0.1 10*3/uL (ref 0.0–0.1)
Basophils Absolute: 0.1 10*3/uL (ref 0.0–0.1)
Basophils Relative: 1 %
Basophils Relative: 0 %
Eosinophils Absolute: 0.3 10*3/uL (ref 0.0–0.5)
Eosinophils Absolute: 0.2 10*3/uL (ref 0.0–0.5)
Eosinophils Relative: 2 %
Eosinophils Relative: 1 %
HCT: 21.3 % — ABNORMAL LOW (ref 36.0–46.0)
HCT: 25.2 % — ABNORMAL LOW (ref 36.0–46.0)
Hemoglobin: 7.1 g/dL — ABNORMAL LOW (ref 12.0–15.0)
Hemoglobin: 8.3 g/dL — ABNORMAL LOW (ref 12.0–15.0)
Immature Granulocytes: 1 %
Immature Granulocytes: 1 %
Lymphocytes Relative: 11 %
Lymphocytes Relative: 8 %
Lymphs Abs: 1.9 10*3/uL (ref 0.7–4.0)
Lymphs Abs: 2 10*3/uL (ref 0.7–4.0)
MCH: 25.8 pg — ABNORMAL LOW (ref 26.0–34.0)
MCH: 25.5 pg — ABNORMAL LOW (ref 26.0–34.0)
MCHC: 33.3 g/dL (ref 30.0–36.0)
MCHC: 32.9 g/dL (ref 30.0–36.0)
MCV: 77.5 fL — ABNORMAL LOW (ref 80.0–100.0)
MCV: 77.5 fL — ABNORMAL LOW (ref 80.0–100.0)
Monocytes Absolute: 1.6 10*3/uL — ABNORMAL HIGH (ref 0.1–1.0)
Monocytes Absolute: 2.8 10*3/uL — ABNORMAL HIGH (ref 0.1–1.0)
Monocytes Relative: 9 %
Monocytes Relative: 12 %
Neutro Abs: 13.7 10*3/uL — ABNORMAL HIGH (ref 1.7–7.7)
Neutro Abs: 19.1 10*3/uL — ABNORMAL HIGH (ref 1.7–7.7)
Neutrophils Relative %: 76 %
Neutrophils Relative %: 78 %
Platelets: 209 10*3/uL (ref 150–400)
Platelets: 381 10*3/uL (ref 150–400)
RBC: 2.75 MIL/uL — ABNORMAL LOW (ref 3.87–5.11)
RBC: 3.25 MIL/uL — ABNORMAL LOW (ref 3.87–5.11)
RDW: 15.6 % — ABNORMAL HIGH (ref 11.5–15.5)
RDW: 15.6 % — ABNORMAL HIGH (ref 11.5–15.5)
WBC: 17.7 10*3/uL — ABNORMAL HIGH (ref 4.0–10.5)
WBC: 24.2 10*3/uL — ABNORMAL HIGH (ref 4.0–10.5)
nRBC: 0 % (ref 0.0–0.2)
nRBC: 0 % (ref 0.0–0.2)

## 2019-06-06 LAB — COMPREHENSIVE METABOLIC PANEL
ALT: 40 U/L (ref 0–44)
AST: 99 U/L — ABNORMAL HIGH (ref 15–41)
Albumin: 1.8 g/dL — ABNORMAL LOW (ref 3.5–5.0)
Alkaline Phosphatase: 120 U/L (ref 38–126)
Anion gap: 12 (ref 5–15)
BUN: 33 mg/dL — ABNORMAL HIGH (ref 8–23)
CO2: 23 mmol/L (ref 22–32)
Calcium: 8.2 mg/dL — ABNORMAL LOW (ref 8.9–10.3)
Chloride: 104 mmol/L (ref 98–111)
Creatinine, Ser: 2.32 mg/dL — ABNORMAL HIGH (ref 0.44–1.00)
GFR calc Af Amer: 23 mL/min — ABNORMAL LOW (ref >60)
GFR calc non Af Amer: 20 mL/min — ABNORMAL LOW (ref >60)
Glucose, Bld: 91 mg/dL (ref 70–99)
Potassium: 4.4 mmol/L (ref 3.5–5.1)
Sodium: 139 mmol/L (ref 135–145)
Total Bilirubin: 1 mg/dL (ref 0.3–1.2)
Total Protein: 6.2 g/dL — ABNORMAL LOW (ref 6.5–8.1)

## 2019-06-06 LAB — GLUCOSE, CAPILLARY
Glucose-Capillary: 92 mg/dL (ref 70–99)
Glucose-Capillary: 106 mg/dL — ABNORMAL HIGH (ref 70–99)
Glucose-Capillary: 100 mg/dL — ABNORMAL HIGH (ref 70–99)
Glucose-Capillary: 96 mg/dL (ref 70–99)

## 2019-06-06 MED ORDER — LORAZEPAM 2 MG/ML IJ SOLN
1.0000 mg | Freq: Once | INTRAMUSCULAR | Status: AC
  Administered 2019-06-06: 1 mg via INTRAVENOUS

## 2019-06-06 MED ORDER — FUROSEMIDE 10 MG/ML IJ SOLN
40.0000 mg | Freq: Once | INTRAMUSCULAR | Status: AC
  Administered 2019-06-06: 40 mg via INTRAVENOUS
  Filled 2019-06-06: qty 4

## 2019-06-06 MED ORDER — LORAZEPAM 2 MG/ML IJ SOLN
INTRAMUSCULAR | Status: AC
  Filled 2019-06-06: qty 1

## 2019-06-06 NOTE — Progress Notes (Signed)
RN called for nurse consult:  Patient with slurred speech and possible facial droop.  She has had varying neuro status through out this hospitalization.  Upon my arrival patient has received Ativan for "seizure activity"  MD has seen patient, per note.  She is sleepy post ativan, no focal deficit noted.  No RRT needs.  RN to call if assistance needed.

## 2019-06-06 NOTE — Progress Notes (Signed)
PT Cancellation Note  Patient Details Name: EMNET MONK MRN: [id number] DOB: 03-15-1946   Cancelled Treatment:    Reason Eval/Treat Not Completed: Medical issues which prohibited therapy. RN reports patient may be having seizures, asked that we hold for today. Will follow back tomorrow.    Gregoria Selvy 06/06/2019, 12:06 PM

## 2019-06-06 NOTE — Progress Notes (Signed)
PROGRESS NOTE  Stephanie Yates QMG:[id number] DOB: [birth year]   PCP: Josetta Huddle, MD  Patient is from: Home  DOA: 05/26/2019 LOS: 31  Brief Narrative / Interim history: 73 year old female with history of systolic CHF/NICM (EF 10 to 15%), VT s/p AICD 2014 and biventricular PPM, paroxysmal A. fib/bradycardia, chronic anemia, CKD-4, HTN, HLD, moderate protein calorie malnutrition and GIB, prolonged QT/torsade and multiple hospitalizations presented to ED 11/38/20 with abdominal pain, nausea, vomiting, shortness of breath and lower extremity edema.   In ED, HDS.  W 16.  Hgb 7.7 (baseline 8-9).  Creatinine 2.0 (about baseline).  BNP 1587 (slightly higher than baseline).  CXR concerning for CHF.  CT abdomen and pelvis with right large inguinal hernia without obstruction or incarceration.  FOBT positive.  She was admitted for CHF exacerbation and GI bleed.  Started on IV diuretics.  Will give GI consulted.  Patient was evaluated by Dr. Ethlyn Daniels from [facility].  GI did not feel EGD or colonoscopy is necessary as she had several of them in the last 2 months.  GI suggested intermittent blood and iron transfusion unless rampant overt bleeding in which case she would need transfer to tertiary center given his significant cardiac history.  Advanced heart failure team consulted 05/28/2019.  Started on higher dose IV Lasix.  Patient had [place] on 05/30/2019 that revealed moderately elevated filling pressure with normal cardiac output.   On 12/3, patient developed dizziness that she described as tenderness moving up and down.  CT head without contrast obtained but no acute finding. Neuro exam although limited was not impressive except for generalized weakness.  She was given meclizine x1.  Of note, patient has history of dizziness for the last 3 months per patient's daughter.  On 05/31/2019, had some ataxia, left-sided arm weakness and dysarthria when evaluated by PT.  Code stroke activated.  Stat CT head, CTA head  and neck obtained but not significant finding.  EEG without epileptiform discharge.  Ammonia elevated to 105.  Started on lactulose.  Patient is not a candidate for MRI due to noncompatible AICD.  Assessment & Plan: Stroke-like symptoms/hepatic encephalopathy: Initial CT head, and CTA head and neck done on 05/31/2019 without acute finding.  Neuro exam not convincing for CVA.  -Repeat CT head from 06/01/2019 again unremarkable.  Once again on 06/04/2019, when seen by PT, she was found to have drift of left leg and possible peripheral visual loss.  Due to these findings, repeat CT scan was done on 06/04/2019 which was once again unremarkable or negative for any acute stroke with no change from recent CT.  AICD not compatible with MRI.  Her ammonia is back to normal.  She was alert and oriented when I saw her earlier today.  Once again, rapid response was called on her at around 11:35 AM for slurred speech, altered mental status and left-sided weakness.  I personally saw patient at the bedside second time today.  I noticed some jerking movements in the left upper extremity and slurred speech and lethargy.  She seems to be having possibly seizures or TIA episodes.  This is her fourth episode as such during this hospitalization and for this, she has had 3 CT scans already.  I do not think any further CT scans of the head are warranted.  1 mg IV Ativan was ordered.  EEG ordered.  Acute on chronic systolic CHF/NICM: NYHA IIIb-IV.  most recent echo with EF of 10 to 15%.  Per advanced HF team, patient declined  palliative care/hospice in the past.  Patient has cardinal symptoms including dyspnea, orthopnea and PND.  Total output 100 mL in last 24 hours but creatinine stable. -RHC on 12/3 with moderately elevated filling pressure with normal cardiac output.  -Advanced HF managing-on p.o. Lasix 40 mg daily.  She still has some crackles on the lung exam.  We will give her another dose of IV Lasix today.   History of  recurrent VT s/p AICD -Continue telemetry monitoring -Optimize K and Mg  Paroxysmal A. Fib/bradycardia -Coreg as above.  Rate controlled. -On Eliquis for anticoagulation prior to admission but this has been on hold due to GI bleeding.  Acute on chronic blood loss anemia in patient with GI bleed and chronic kidney disease Acute on chronic GI bleed-FOBT positive.  No scope per GI.  Consider tertiary center if significant bleeding.  GI signed off.  Anemia panel normal.  Her hemoglobin dropped to 7.1 today.  Repeat is 8.3. -Hgb 8-9 (baseline)> 7.7 (admit)>> 7.7> 7.8> 8.3> 7.5> 8.2> 7.7> 8.1> 7.1> 8.3 -Continue PPI -Monitor H&H daily.  CKD-4: Stable. -Continue monitoring  Leukocytosis: No obvious source of infection.  She has abdominal tenderness on exam.  Initial CT abdomen and pelvis reassuring and so was repeat CT abdomen done on 06/01/2019.  CT head with chronic sinusitis.  CXR consistent with CHF.  UA not impressive.  White cells slightly worse today but she remains afebrile and no other symptoms other than abdominal pain.  Abdominal pain/tenderness: could be due to CHF, large right inguinal hernia and constipation.  Initial CT A/P reassuring and so a CT abdomen and pelvis debated on 06/01/2019.  Essential hypertension: Fairly controlled.  Continue carvedilol.  Continue to hold hydralazine.  Mild hyperkalemia: Resolved. -Continue monitoring  Constipation: Disimpacted 12/2. -Continue lactulose.   Pressure Injury 05/29/19 Buttocks Left;Right Stage I -  Intact skin with non-blanchable redness of a localized area usually over a bony prominence. (Active)  05/29/19 1105  Location: Buttocks  Location Orientation: Left;Right  Staging: Stage I -  Intact skin with non-blanchable redness of a localized area usually over a bony prominence.  Wound Description (Comments):   Present on Admission: Yes     Pressure Injury 05/31/19 Buttocks Left Stage II -  Partial thickness loss of dermis  presenting as a shallow open ulcer with a red, pink wound bed without slough. (Active)  05/31/19 0800  Location: Buttocks  Location Orientation: Left  Staging: Stage II -  Partial thickness loss of dermis presenting as a shallow open ulcer with a red, pink wound bed without slough.  Wound Description (Comments):   Present on Admission:     Moderate protein calorie malnutrition: -Dietitian consulted. Nutrition Problem: Increased nutrient needs Etiology: chronic illness  Signs/Symptoms: estimated needs  Interventions: Education   DVT prophylaxis: SCD Code Status: Partial.  No intubation. Family Communication: No family present at bedside.  Patient lives by herself.  Prolonged hospitalization with her intermittent neuro symptoms.  She seems to have very poor prognosis.  I have consulted palliative care to see if we can make her DNR and possibly hospice. Disposition Plan: Remains inpatient for CHF and hepatic encephalopathy.  PT now recommend SNF.  She has placement arranged and administrative COVID-19 is negative as well however she is not quite stable to discharge today. Consultants: GI (signed off), advanced HF, neurology   Subjective: Patient once again seen and examined twice today.  Earlier when I saw her, she was alert and oriented.  She was on room  air saturating more than 90%.  She did complain of some dizziness but no shortness of breath.  I was called again at 11:35 AM about RRT due to change in mental status, slurred speech and some weakness.  I saw patient again at that point in time.  Details as above.  Objective: Vitals:   06/05/19 1946 06/06/19 0005 06/06/19 0358 06/06/19 0731  BP: (!) 108/59 (!) 104/49 (!) 130/53 (!) 116/47  Pulse: (!) 59 60 (!) 59 74  Resp:      Temp: (!) 97.4 F (36.3 C) 97.9 F (36.6 C) 97.8 F (36.6 C)   TempSrc: Oral Oral Oral   SpO2: 100% 98% 100% 94%  Weight:   60.2 kg   Height:        Intake/Output Summary (Last 24 hours) at 06/06/2019  0943 Last data filed at 06/06/2019 0826 Gross per 24 hour  Intake -  Output 650 ml  Net -650 ml   Filed Weights   06/04/19 0444 06/05/19 0419 06/06/19 0358  Weight: 63.3 kg 61.9 kg 60.2 kg    Examination:  General exam: Appears lethargic Respiratory system: Crackles bilaterally. Respiratory effort normal. Cardiovascular system: S1 & S2 heard, RRR. No JVD, murmurs, rubs, gallops or clicks. No pedal edema. Gastrointestinal system: Abdomen is nondistended, soft and mild generalized tenderness, no organomegaly or masses felt. Normal bowel sounds heard. Central nervous system: Lethargic but oriented.  Did not have any focal neurological deficit during first examination but second time she did have some twitching activity in her left upper extremity and had some slurred speech. Extremities: Symmetric 5 x 5 power. Skin: No rashes, lesions or ulcers.  Psychiatry: Unable to assess.  Procedures:  12/3-RHC with moderately elevated filling pressure with normal cardiac output.   Microbiology summarized: COVID-19 negative  Sch Meds:  Scheduled Meds: . amiodarone  200 mg Oral BID  . apixaban  2.5 mg Oral BID  . atorvastatin  40 mg Oral q1800  . carvedilol  3.125 mg Oral BID WC  . feeding supplement (ENSURE ENLIVE)  237 mL Oral Q24H  . ferrous gluconate  324 mg Oral BID  . furosemide  40 mg Oral Daily  . insulin aspart  0-6 Units Subcutaneous TID WC  . lactulose  30 g Oral TID  . loratadine  10 mg Oral Daily  . magnesium oxide  400 mg Oral Daily  . mexiletine  200 mg Oral BID  . pantoprazole  40 mg Oral Daily  . sodium chloride flush  3 mL Intravenous Q12H  . sodium chloride flush  3 mL Intravenous Q12H  . sodium chloride flush  3 mL Intravenous Q12H  . sucralfate  1 g Oral TID WC & HS   Continuous Infusions: . sodium chloride    . sodium chloride     PRN Meds:.sodium chloride, sodium chloride, acetaminophen, albuterol, promethazine, sodium chloride, sodium chloride flush, sodium  chloride flush  Antimicrobials: Anti-infectives (From admission, onward)   None       I have personally reviewed the following labs and images: CBC: Recent Labs  Lab 06/02/19 0419 06/03/19 0431 06/04/19 0334 06/05/19 0243 06/06/19 0405  WBC 17.6* 22.2* 15.4* 16.3* 17.7*  NEUTROABS 13.8* 16.6* 11.6* 12.5* 13.7*  HGB 7.5* 8.2* 7.7* 8.1* 7.1*  HCT 23.2* 24.7* 22.6* 24.7* 21.3*  MCV 78.1* 76.5* 76.6* 78.9* 77.5*  PLT 266 308 247 235 209   BMP &GFR Recent Labs  Lab 05/31/19 0324 06/01/19 0317 06/02/19 0419 06/03/19 0431 06/04/19 0334 06/05/19 0243  06/06/19 0405  NA 136 138 139 137 138 139 139  K 4.3 5.0 3.9 4.0 3.9 3.8 4.4  CL 105 105 105 100 102 102 104  CO2 24 23 26 25 25 25 23   GLUCOSE 98 74 101* 116* 98 94 91  BUN 34* 37* 35* 35* 34* 32* 33*  CREATININE 2.07* 2.04* 2.12* 2.36* 2.35* 2.31* 2.32*  CALCIUM 8.2* 8.1* 8.3* 8.4* 8.2* 8.4* 8.2*  MG 2.2 2.3 2.3 2.4  --   --   --    Estimated Creatinine Clearance: 17 mL/min (A) (by C-G formula based on SCr of 2.32 mg/dL (H)). Liver & Pancreas: Recent Labs  Lab 06/01/19 0317 06/03/19 0431 06/04/19 0334 06/05/19 0243 06/06/19 0405  AST 92* 92* 114* 111* 99*  ALT 39 40 42 45* 40  ALKPHOS 105 122 132* 129* 120  BILITOT 1.2 1.3* 1.3* 1.5* 1.0  PROT 6.5 6.8 6.2* 6.5 6.2*  ALBUMIN 2.0* 1.9* 1.7* 1.7* 1.8*   No results for input(s): LIPASE, AMYLASE in the last 168 hours. Recent Labs  Lab 06/01/19 0948 06/02/19 0419 06/03/19 0433 06/04/19 0642 06/05/19 0444  AMMONIA 79* 50* 79* 124* 33   Diabetic: No results for input(s): HGBA1C in the last 72 hours. Recent Labs  Lab 06/04/19 1642 06/04/19 2108 06/05/19 0852 06/05/19 1658 06/06/19 0729  GLUCAP 118* 109* 139* 108* 92   Cardiac Enzymes: No results for input(s): CKTOTAL, CKMB, CKMBINDEX, TROPONINI in the last 168 hours. No results for input(s): PROBNP in the last 8760 hours. Coagulation Profile: No results for input(s): INR, PROTIME in the last 168  hours. Thyroid Function Tests: No results for input(s): TSH, T4TOTAL, FREET4, T3FREE, THYROIDAB in the last 72 hours. Lipid Profile: No results for input(s): CHOL, HDL, LDLCALC, TRIG, CHOLHDL, LDLDIRECT in the last 72 hours. Anemia Panel: No results for input(s): VITAMINB12, FOLATE, FERRITIN, TIBC, IRON, RETICCTPCT in the last 72 hours. Urine analysis:    Component Value Date/Time   COLORURINE AMBER (A) 06/01/2019 1954   APPEARANCEUR CLOUDY (A) 06/01/2019 1954   LABSPEC 1.020 06/01/2019 1954   PHURINE 5.0 06/01/2019 1954   GLUCOSEU NEGATIVE 06/01/2019 1954   HGBUR SMALL (A) 06/01/2019 [place] NEGATIVE 06/01/2019 [place] NEGATIVE 06/01/2019 1954   PROTEINUR 30 (A) 06/01/2019 1954   UROBILINOGEN 1.0 08/29/2014 2035   NITRITE NEGATIVE 06/01/2019 1954   LEUKOCYTESUR TRACE (A) 06/01/2019 1954   Sepsis Labs: Invalid input(s): PROCALCITONIN, [employer]  Microbiology: Recent Results (from the past 240 hour(s))  Culture, Urine     Status: Abnormal   Collection Time: 06/01/19  7:41 PM   Specimen: Urine, Clean Catch  Result Value Ref Range Status   Specimen Description URINE, CLEAN CATCH  Final   Special Requests   Final    NONE Performed at [facility], [address] [place], [place] [address]    Culture >=100,000 COLONIES/mL KLEBSIELLA PNEUMONIAE (A)  Final   Report Status 06/04/2019 FINAL  Final   Organism ID, Bacteria KLEBSIELLA PNEUMONIAE (A)  Final      Susceptibility   Klebsiella pneumoniae - MIC*    AMPICILLIN RESISTANT Resistant     CEFAZOLIN <=4 SENSITIVE Sensitive     CEFTRIAXONE <=1 SENSITIVE Sensitive     CIPROFLOXACIN <=0.25 SENSITIVE Sensitive     GENTAMICIN <=1 SENSITIVE Sensitive     IMIPENEM <=0.25 SENSITIVE Sensitive     NITROFURANTOIN <=16 SENSITIVE Sensitive     TRIMETH/SULFA <=20 SENSITIVE Sensitive     AMPICILLIN/SULBACTAM <=2 SENSITIVE  Sensitive     PIP/TAZO <=4 SENSITIVE Sensitive     Extended ESBL NEGATIVE Sensitive      * >=100,000 COLONIES/mL KLEBSIELLA PNEUMONIAE  SARS CORONAVIRUS 2 (TAT 6-24 HRS) Nasopharyngeal Nasopharyngeal Swab     Status: None   Collection Time: 06/05/19  3:15 PM   Specimen: Nasopharyngeal Swab  Result Value Ref Range Status   SARS Coronavirus 2 NEGATIVE NEGATIVE Final    Comment: (NOTE) SARS-CoV-2 target nucleic acids are NOT DETECTED. The SARS-CoV-2 RNA is generally detectable in upper and lower respiratory specimens during the acute phase of infection. Negative results do not preclude SARS-CoV-2 infection, do not rule out co-infections with other pathogens, and should not be used as the sole basis for treatment or other patient management decisions. Negative results must be combined with clinical observations, patient history, and epidemiological information. The expected result is Negative. Fact Sheet for Patients: SugarRoll.be Fact Sheet for Healthcare Providers: https://www.woods-mathews.com/ This test is not yet approved or cleared by the [place] FDA and  has been authorized for detection and/or diagnosis of SARS-CoV-2 by FDA under an Emergency Use Authorization (EUA). This EUA will remain  in effect (meaning this test can be used) for the duration of the COVID-19 declaration under Section 56 4(b)(1) of the Act, 21 U.S.C. section 360bbb-3(b)(1), unless the authorization is terminated or revoked sooner. Performed at [facility], [place] [address], [place], [place] [address]     Radiology Studies: US Abdomen Limited  Result Date: 06/05/2019 CLINICAL DATA:  Ascites EXAM: LIMITED ABDOMEN ULTRASOUND FOR ASCITES TECHNIQUE: Limited ultrasound survey for ascites was performed in all four abdominal quadrants. COMPARISON:  CT abdomen pelvis 06/01/2019 FINDINGS: Small volume ascites identified in the 4 quadrants. IMPRESSION: Scattered small volume ascites. Electronically Signed   By: Lavonia Dana M.D.   On: 06/05/2019 13:15    DG CHEST PORT 1 VIEW  Result Date: 06/05/2019 CLINICAL DATA:  Congestive heart failure. EXAM: PORTABLE CHEST 1 VIEW COMPARISON:  Single-view of the chest 05/31/2019. PA and lateral chest 05/26/2019. FINDINGS: There is cardiomegaly without edema. No consolidative process, pneumothorax or effusion. AICD is in place. IMPRESSION: Cardiomegaly without acute disease. Electronically Signed   By: Inge Rise M.D.   On: 06/05/2019 13:23   Total time spent 38 minutes Darliss Cheney, MD [facility]  If 7PM-7AM, please contact night-coverage [email or web address] Password [email or web address] 06/06/2019, 9:43 AM

## 2019-06-06 NOTE — Progress Notes (Signed)
Pt with mental statis changes, slurred speech, left facial droop, agitation, rapid response , Dr Doristine Bosworth notified.

## 2019-06-07 ENCOUNTER — Encounter (HOSPITAL_COMMUNITY): Payer: Self-pay | Admitting: Internal Medicine

## 2019-06-07 ENCOUNTER — Encounter: Payer: Medicare Other | Admitting: Internal Medicine

## 2019-06-07 DIAGNOSIS — Z515 Encounter for palliative care: Secondary | ICD-10-CM

## 2019-06-07 DIAGNOSIS — Z7189 Other specified counseling: Secondary | ICD-10-CM

## 2019-06-07 LAB — COMPREHENSIVE METABOLIC PANEL
ALT: 36 U/L (ref 0–44)
AST: 82 U/L — ABNORMAL HIGH (ref 15–41)
Albumin: 1.7 g/dL — ABNORMAL LOW (ref 3.5–5.0)
Alkaline Phosphatase: 110 U/L (ref 38–126)
Anion gap: 9 (ref 5–15)
BUN: 33 mg/dL — ABNORMAL HIGH (ref 8–23)
CO2: 25 mmol/L (ref 22–32)
Calcium: 8.2 mg/dL — ABNORMAL LOW (ref 8.9–10.3)
Chloride: 105 mmol/L (ref 98–111)
Creatinine, Ser: 2.39 mg/dL — ABNORMAL HIGH (ref 0.44–1.00)
GFR calc Af Amer: 22 mL/min — ABNORMAL LOW (ref >60)
GFR calc non Af Amer: 19 mL/min — ABNORMAL LOW (ref >60)
Glucose, Bld: 104 mg/dL — ABNORMAL HIGH (ref 70–99)
Potassium: 3.4 mmol/L — ABNORMAL LOW (ref 3.5–5.1)
Sodium: 139 mmol/L (ref 135–145)
Total Bilirubin: 1.5 mg/dL — ABNORMAL HIGH (ref 0.3–1.2)
Total Protein: 6 g/dL — ABNORMAL LOW (ref 6.5–8.1)

## 2019-06-07 LAB — CBC WITH DIFFERENTIAL/PLATELET
Abs Immature Granulocytes: 0.07 10*3/uL (ref 0.00–0.07)
Basophils Absolute: 0.1 10*3/uL (ref 0.0–0.1)
Basophils Relative: 1 %
Eosinophils Absolute: 0.3 10*3/uL (ref 0.0–0.5)
Eosinophils Relative: 2 %
HCT: 22.2 % — ABNORMAL LOW (ref 36.0–46.0)
Hemoglobin: 7.3 g/dL — ABNORMAL LOW (ref 12.0–15.0)
Immature Granulocytes: 0 %
Lymphocytes Relative: 12 %
Lymphs Abs: 1.9 10*3/uL (ref 0.7–4.0)
MCH: 25.6 pg — ABNORMAL LOW (ref 26.0–34.0)
MCHC: 32.9 g/dL (ref 30.0–36.0)
MCV: 77.9 fL — ABNORMAL LOW (ref 80.0–100.0)
Monocytes Absolute: 1.3 10*3/uL — ABNORMAL HIGH (ref 0.1–1.0)
Monocytes Relative: 8 %
Neutro Abs: 13 10*3/uL — ABNORMAL HIGH (ref 1.7–7.7)
Neutrophils Relative %: 77 %
Platelets: 218 10*3/uL (ref 150–400)
RBC: 2.85 MIL/uL — ABNORMAL LOW (ref 3.87–5.11)
RDW: 15.8 % — ABNORMAL HIGH (ref 11.5–15.5)
WBC: 16.5 10*3/uL — ABNORMAL HIGH (ref 4.0–10.5)
nRBC: 0 % (ref 0.0–0.2)

## 2019-06-07 LAB — GLUCOSE, CAPILLARY
Glucose-Capillary: 93 mg/dL (ref 70–99)
Glucose-Capillary: 94 mg/dL (ref 70–99)

## 2019-06-07 MED ORDER — LACTULOSE 20 G PO PACK: 20.0000 g | PACK | Freq: Two times a day (BID) | ORAL | 0 refills | Status: DC

## 2019-06-07 MED ORDER — POTASSIUM CHLORIDE CRYS ER 20 MEQ PO TBCR
40.0000 meq | EXTENDED_RELEASE_TABLET | Freq: Once | ORAL | Status: AC
  Administered 2019-06-07: 40 meq via ORAL
  Filled 2019-06-07: qty 2

## 2019-06-07 NOTE — TOC Transition Note (Addendum)
Transition of Care [facility]) - CM/SW Discharge Note   Patient Details  Name: Stephanie Yates MRN: [id number] Date of Birth: 15-Jun-1946  [facility] CM/SW Contact:  Benard Halsted, LCSW Phone Number: 06/07/2019, 1:02 PM   Clinical Narrative:    Patient will DC to: [facility] Anticipated DC date: 06/07/19 Family notified: Daughter had medical questions. CSW referred her to patient's RN.  Transport by: Glenna Fellows   Per MD patient ready for DC to [facility]. RN, patient, patient's family, and facility notified of DC. Discharge Summary and FL2 sent to facility. RN to call report prior to discharge [phone] Room 314). DC packet on chart. Ambulance transport requested for patient.   CSW will sign off for now as social work intervention is no longer needed. Please consult [place] again if new needs arise.  Cedric Fishman, LCSW Clinical Social Worker [phone]    Final next level of care: Skilled Nursing Facility Barriers to Discharge: No Barriers Identified   Patient Goals and CMS Choice Patient states their goals for this hospitalization and ongoing recovery are:: "to get better"   Choice offered to / list presented to : Patient  Discharge Placement   Existing PASRR number confirmed : 06/07/19          Patient chooses bed at: [facility] Patient to be transferred to facility by: [facility] Name of family member notified: Patient alerting family Patient and family notified of of transfer: 06/07/19  Discharge Plan and Services In-house Referral: NA Discharge Planning Services: CM Consult Post Acute Care Choice: Skilled Nursing Facility                    HH Arranged: NA [facility] Agency: [facility] Date [place]: 05/27/19 Time [place]: [id number] Representative spoke with at [place]: [place] ([place]) Interventions     Readmission Risk Interventions Readmission Risk Prevention Plan 06/05/2019  05/27/2019 05/10/2019  Transportation Screening Complete Complete Complete  PCP or Specialist Appt within 3-5 Days - - -  HRI or [place] for [place] - - -  Medication Review Press photographer) Complete Complete Complete  Med Review Comments - - -  PCP or Specialist appointment within 3-5 days of discharge Complete Complete Complete  HRI or Home Care Consult Complete Complete Complete  SW Recovery Care/Counseling Consult Complete Complete Complete  Palliative Care Screening Not Applicable Not Applicable Not Applicable  Skilled Nursing Facility Complete Not Applicable Not Applicable  Some recent data might be hidden

## 2019-06-07 NOTE — Progress Notes (Signed)
Palliative:    Stephanie Yates is well-known to the palliative medicine team.  She is being seen by several providers starting with a hospitalization in July of this year.  She has been hospitalized 7 times with 3 ED visits in the last 6 months.  She does have support at home from her daughter, [name].  [name] appears chronically ill and frail.  Staff is at bedside assiting with needs, and we reposition her in bed.  She is able to make her basic needs known, and shares that she wants to go to rehab now.   Previously [name] lived at home alone.  She was to have outpatient palliative services to follow, but unsure if this was started as she was discharged 11/17, and readmitted on 11/29. Agreeable to rehab at [facility], awaiting insurance authorization.  Will need updated Covid testing   Plan: DC to rehab, outpatient palliative to follow.   63 minutes  Quinn Axe, NP Palliative Medicine Team [phone] Greater than 50% of the time was spent in counseling and coordination of care

## 2019-06-07 NOTE — TOC Progression Note (Signed)
Transition of Care [facility]) - Progression Note    Patient Details  Name: MARILY KONCZAL MRN: [id number] Date of Birth: [birth year]  Transition of Care [facility]) CM/SW [place], LCSW Phone Number: 06/07/2019, 10:44 AM  Clinical Narrative:    Covering CSW contacted insurance to check on status of insurance approval. They report they have not received any information on the patient. CSW completed screening and faxed clinicals to them for review. CSW notes patient is to be seen by PT today, which may help with authorization.    Expected Discharge Plan: Skilled Nursing Facility Barriers to Discharge: Insurance Authorization  Expected Discharge Plan and Services Expected Discharge Plan: [place] In-house Referral: NA Discharge Planning Services: CM Consult Post Acute Care Choice: [place] Living arrangements for the past 2 months: Single Family Home Expected Discharge Date: 06/07/19                         HH Arranged: NA HH Agency: [facility] Date [place]: 05/27/19 Time [place]: [place] Representative spoke with at [place] ([place]) Interventions    Readmission Risk Interventions Readmission Risk Prevention Plan 06/05/2019 05/27/2019 05/10/2019  Transportation Screening Complete Complete Complete  PCP or Specialist Appt within 3-5 Days - - -  HRI or [place] Work Consult for [place] - - -  Medication Review Press photographer) Complete Complete Complete  Med Review Comments - - -  PCP or Specialist appointment within 3-5 days of discharge Complete Complete Complete  HRI or Home Care Consult Complete Complete Complete  SW Recovery Care/Counseling Consult Complete Complete Complete  Palliative Care Screening Not Applicable Not Applicable Not Applicable  Skilled Nursing Facility  Complete Not Applicable Not Applicable  Some recent data might be hidden

## 2019-06-07 NOTE — Discharge Summary (Signed)
Physician Discharge Summary  Stephanie Yates MGQ:[id number] DOB: 1945/12/02 DOA: 05/26/2019  PCP: Josetta Huddle, MD  Admit date: 05/26/2019 Discharge date: 06/07/2019  Admitted From: Home Disposition: SNF  Recommendations for Outpatient Follow-up:  1. Follow up with PCP in 1-2 weeks 2. Please obtain BMP/CBC in one week 3. Please follow up on the following pending results:  Home Health: None Equipment/Devices: None  Discharge Condition: Stable CODE STATUS: Partial code (okay with CPR but no intubation) Diet recommendation: Cardiac  Subjective: Seen and examined.  Alert and oriented.  Continues to complain of abdominal pain.  No other complaint.  Brief/Interim Summary: 73 year old female with history of systolic CHF/NICM (EF 10 to 15%), VT s/p AICD 2014 and biventricular PPM, paroxysmal A. fib/bradycardia, chronic anemia, CKD-4, HTN, HLD, moderate protein calorie malnutrition and GIB, prolonged QT/torsade and multiple hospitalizations presented to ED 11/38/20 with abdominal pain, nausea, vomiting, shortness of breath and lower extremity edema.   In ED, HDS.  W 16.  Hgb 7.7 (baseline 8-9).  Creatinine 2.0 (about baseline).  BNP 1587 (slightly higher than baseline).  CXR concerning for CHF.  CT abdomen and pelvis with right large inguinal hernia without obstruction or incarceration.  FOBT positive.  She was admitted for CHF exacerbation and GI bleed.  Started on IV diuretics.  Will give GI consulted.  Patient was evaluated by Dr. Ethlyn Daniels from [facility].  GI did not feel EGD or colonoscopy is necessary as she had several of them in the last 2 months.  GI suggested intermittent blood and iron transfusion unless rampant overt bleeding in which case she would need transfer to tertiary center given his significant cardiac history.  Advanced heart failure team consulted 05/28/2019.  Started on higher dose IV Lasix.  Patient had [place] on 05/30/2019 that revealed moderately elevated filling pressure  with normal cardiac output.   On 12/3, patient developed dizziness that she described as tenderness moving up and down.  CT head without contrast obtained but no acute finding. Neuro exam although limited was not impressive except for generalized weakness.  She was given meclizine x1.  Of note, patient has history of dizziness for the last 3 months per patient's daughter.  On 05/31/2019, had some ataxia, left-sided arm weakness and dysarthria when evaluated by PT.  Code stroke activated.  Stat CT head, CTA head and neck obtained but not significant finding.  EEG without epileptiform discharge.  Ammonia elevated to 105.  Started on lactulose.  Patient was not a candidate for MRI due to noncompatible AICD.  Patient's neuro symptoms had resolved.  Ammonia improved.  She became alert and oriented.  She was continued on her daily Lasix 40 mg that was recommended by cardiology.  Between 06/01/2019 and 06/06/2019, there were 2 episodes when patient was found to have slurred speech, lethargy and some left-sided weakness.  She had CT of the head stat done at 2 more times and both of these times she was not found to have any stroke and again MRI could not be done due to her having MRI incompatible AICD.  She had EEG x2 done in both of them did not show any epileptiform activity but it did show diffuse moderate encephalopathy..  Due to some vascular congestion, she was given extra doses of IV Lasix yesterday and day before.  She was seen by PT OT initially who had recommended home health but patient was not comfortable going home.  Subsequently PT OT recommended SNF.  This has been arranged for her.  She  is Covid negative.  Her hemoglobin has been very labile being anywhere from 7.1-8.5.  She has not required any transfusion.  She is going to be discharged to SNF today in a stable condition.  Discharge Diagnoses:  Principal Problem:   Acute on chronic systolic CHF (congestive heart failure) (HCC) Active Problems:    Nonischemic cardiomyopathy (HCC)   PAF (paroxysmal atrial fibrillation) (HCC)   ICD (implantable cardioverter-defibrillator), dual, [facility] [employer]   Symptomatic anemia   Acute GI bleeding   CKD (chronic kidney disease) stage 4, GFR 15-29 ml/min (HCC)   Epigastric abdominal pain   Prolonged QT interval   Pressure injury of skin    Discharge Instructions  Discharge Instructions    Discharge patient   Complete by: As directed    Discharge disposition: 03-Skilled Nursing Facility   Discharge patient date: 06/07/2019     Allergies as of 06/07/2019      Reactions   Strawberry Extract Hives, Itching, Swelling         Medication List    STOP taking these medications   hydrALAZINE 25 MG tablet Commonly known as: APRESOLINE     TAKE these medications   acetaminophen 500 MG tablet Commonly known as: TYLENOL Take 1,000 mg by mouth every 6 (six) hours as needed (pain).   albuterol 108 (90 Base) MCG/ACT inhaler Commonly known as: VENTOLIN HFA Inhale 2 puffs into the lungs every 4 (four) hours as needed for wheezing or shortness of breath.   amiodarone 200 MG tablet Commonly known as: Pacerone Take 1 tablet (200 mg total) by mouth 2 (two) times daily.   apixaban 2.5 MG Tabs tablet Commonly known as: ELIQUIS Take 1 tablet (2.5 mg total) by mouth 2 (two) times daily.   atorvastatin 40 MG tablet Commonly known as: LIPITOR Take 1 tablet (40 mg total) by mouth daily at 6 PM.   carvedilol 3.125 MG tablet Commonly known as: COREG Take 1 tablet (3.125 mg total) by mouth 2 (two) times daily with a meal.   Ensure Take 237 mLs by mouth daily.   ferrous gluconate 324 MG tablet Commonly known as: FERGON Take 324 mg by mouth 2 (two) times daily.   furosemide 80 MG tablet Commonly known as: LASIX Take 0.5 tablets (40 mg total) by mouth daily.   lactulose 20 g packet Commonly known as: CEPHULAC Take 1 packet (20 g total) by mouth 2 (two) times daily. Hold for 3 or more than 3 bowel  movements per day.   magnesium oxide 400 (241.3 Mg) MG tablet Commonly known as: MAGnesium-Oxide Take 1 tablet (400 mg total) by mouth daily.   mexiletine 200 MG capsule Commonly known as: MEXITIL Take 1 capsule (200 mg total) by mouth 2 (two) times daily.   nitroGLYCERIN 0.4 MG SL tablet Commonly known as: NITROSTAT Place 1 tablet (0.4 mg total) under the tongue every 5 (five) minutes as needed for chest pain.   ondansetron 4 MG disintegrating tablet Commonly known as: Zofran ODT Take 1 tablet (4 mg total) by mouth every 8 (eight) hours as needed for nausea or vomiting.   pantoprazole 40 MG tablet Commonly known as: Protonix Take 1 tablet (40 mg total) by mouth daily.   polyethylene glycol 17 g packet Commonly known as: MIRALAX / GLYCOLAX Take 17 g by mouth daily. Can take up to twice a day for constipation.   senna-docusate 8.6-50 MG tablet Commonly known as: Senokot-S Take 1 tablet by mouth at bedtime as needed for mild constipation.  sucralfate 1 GM/10ML suspension Commonly known as: CARAFATE Take 10 mLs (1 g total) by mouth 4 (four) times daily -  with meals and at bedtime.   Vitamin D-3 25 MCG (1000 UT) Caps Take 1,000 Units by mouth at bedtime.            Durable Medical Equipment  (From admission, onward)         Start     Ordered   06/03/19 1025  Heart failure home health orders  (Heart failure home health orders / Face to face)  Once    Comments: Heart Failure Follow-up Care:  Verify follow-up appointments per Patient Discharge Instructions. Confirm transportation arranged. Reconcile home medications with discharge medication list. Remove discontinued medications from use. Assist patient/caregiver to manage medications using pill box. Reinforce low sodium food selection Assessments: Vital signs and oxygen saturation at each visit. Assess home environment for safety concerns, caregiver support and availability of low-sodium foods. Consult Research officer, political party, PT/OT, Dietitian, and CNA based on assessments. Perform comprehensive cardiopulmonary assessment. Notify MD for any change in condition or weight gain of 3 pounds in one day or 5 pounds in one week with symptoms. Daily Weights and Symptom Monitoring: Ensure patient has access to scales. Teach patient/caregiver to weigh daily before breakfast and after voiding using same scale and record.    Teach patient/caregiver to track weight and symptoms and when to notify Provider. Activity: Develop individualized activity plan with patient/caregiver.   Question Answer Comment  Heart Failure Follow-up Care [facility] ([facility] at [phone]   Obtain the following labs Basic Metabolic Panel   Lab frequency Weekly   Fax lab results to [facility] Clinic at [phone]   Diet Low Sodium Heart Healthy   Fluid restrictions: 1500 mL Fluid      06/03/19 1027          Contact information for follow-up providers    Josetta Huddle, MD Follow up in 1 week(s).   Specialty: Internal Medicine Contact information: [address] [place] [address] [phone]        Deboraha Sprang, MD .   Specialty: Cardiology Contact information: [phone] [place] [address] [phone]            Contact information for after-discharge care    [facility] .   Service: Skilled Nursing Contact information: [address] [address] [phone]                 Allergies  Allergen Reactions  . Strawberry Extract Hives, Itching and Swelling         Consultations: Neurology/cardiology/GI   Procedures/Studies: CT ABDOMEN PELVIS WO CONTRAST  Result Date: 06/01/2019 CLINICAL DATA:  Abdominal pain.  Concern for diverticulitis. EXAM: CT ABDOMEN AND PELVIS WITHOUT CONTRAST TECHNIQUE: Multidetector CT imaging of the abdomen and pelvis was performed following the  standard protocol without IV contrast. COMPARISON:  05/26/2019 FINDINGS: The lack of intravenous contrast limits the ability to evaluate solid abdominal organs. Lower chest: Limited visualization of lower thorax demonstrates minimal bibasilar heterogeneous opacities, left greater than right, improved compared to the 11/29 examination. No pleural effusion. Cardiomegaly. Pacer leads again terminate within the right atrium and ventricle. No pericardial effusion. Hepatobiliary: Nodularity hepatic contour. There is diffuse decreased attempt increased attenuation of the hepatic parenchyma as could be seen in the setting of chronic amiodarone therapy. Trace amount of perihepatic ascites, unchanged  to slightly progressed compared to the 05/26/2019 examination. Normal noncontrast appearance of the gallbladder. No radiopaque gallstones. Pancreas: Normal noncontrast appearance of the pancreas. Spleen: Normal noncontrast appearance of the spleen. Adrenals/Urinary Tract: The bilateral kidneys again appear atrophic. Punctate nonobstructing renal stones are again seen bilaterally with dominant nonobstructing stone within the inferior pole the right kidney measuring approximately 4 mm in diameter (image 38, series 3) and dominant nonobstructing stone within the inferior pole the left kidney measuring approximately 2 mm (image 36, series 3). No renal stones are seen along the expected course of either ureter. Excreted contrast from previous contrast enhance head and neck CTA is seen within the urinary bladder. No urinary obstruction or perinephric stranding. Redemonstrated approximately 3.8 x 1.9 cm hypoattenuating right-sided adrenal adenoma. Normal noncontrast appearance of the left adrenal gland. Stomach/Bowel: Ingested enteric contrast extends to the level of the hepatic flexure of the colon. Redemonstrated small right-sided inguinal hernia which is noted to contain a short-segment of nondilated small bowel (representative  coronal images 39 through 21, series 6), not resulting in enteric obstruction. Moderate colonic stool burden. Scattered colonic diverticulosis without evidence of superimposed acute diverticulitis. Normal appearance of the terminal ileum. The appendix is not visualized, however there is no definitive pericecal inflammatory change on this noncontrast examination. No pneumoperitoneum, pneumatosis or portal venous gas. Vascular/Lymphatic: Atherosclerotic plaque within normal caliber abdominal aorta. No bulky retroperitoneal, mesenteric, pelvic or inguinal lymphadenopathy. Reproductive: Dystrophic calcifications, presumed degenerating fibroids within and expectedly atrophic uterus. No discrete adnexal lesion. Trace amount of fluid in the pelvic cul-de-sac. Other: Moderate amount of diffuse body wall anasarca potentially secondary to third spacing. Musculoskeletal: No acute or aggressive osseous abnormalities. Grade 1 anterolisthesis of L4 upon L5 and L5 upon S1 without associated pars defects. A bone island is noted within the left acetabulum. IMPRESSION: 1. No explanation for patient's abdominal pain. Specifically, no evidence enteric obstruction or diverticulitis on this noncontrast examination. 2. Redemonstrated right inguinal hernia containing nondilated loops of small bowel and not resulting in enteric obstruction. 3. Nonobstructive bilateral nephrolithiasis unchanged. 4. Cardiomegaly with diffuse body wall anasarca as could be seen in the setting early CHF. Clinical correlation is advised. 5. Suspected early cirrhotic change with minimal amount perihepatic ascites. 6.  Aortic Atherosclerosis (ICD10-I70.0). Electronically Signed   By: Sandi Mariscal M.D.   On: 06/01/2019 12:08   CT ABDOMEN PELVIS WO CONTRAST  Result Date: 05/26/2019 CLINICAL DATA:  73 year old female with history of abdominal distension. Diffuse abdominal pain, most severe on the right side. EXAM: CT ABDOMEN AND PELVIS WITHOUT CONTRAST TECHNIQUE:  Multidetector CT imaging of the abdomen and pelvis was performed following the standard protocol without IV contrast. COMPARISON:  CT the abdomen and pelvis 03/17/2019. FINDINGS: Lower chest: Mild scarring in the visualized lung bases. Cardiomegaly. Atherosclerotic calcifications in the left anterior descending, left circumflex and right coronary arteries. Pacemaker leads terminating in the right atrium and right ventricular apex. Hepatobiliary: Liver has a slightly shrunken appearance and nodular contour, suggesting underlying cirrhosis. No definite suspicious cystic or solid hepatic lesions are confidently identified on today's noncontrast CT examination. Unenhanced appearance of the gallbladder is normal. Pancreas: No definite pancreatic mass or peripancreatic fluid collections or inflammatory changes are noted on today's noncontrast CT examination. Spleen: Unremarkable. Adrenals/Urinary Tract: Multiple nonobstructive calculi are noted within the collecting systems of both kidneys, largest of which measures up to 4 mm in the lower pole collecting system of the right kidney. No additional calculi are confidently identified along the course of either ureter  or within the lumen of the urinary bladder. No hydroureteronephrosis. Unenhanced appearance of the kidneys is otherwise normal. Left adrenal gland is normal in appearance. 4.5 x 2.1 cm low-attenuation (-27 HU) right adrenal nodule, similar to prior studies, compatible with an adrenal myelolipoma. Unenhanced appearance of the urinary bladder is normal. Stomach/Bowel: Unenhanced appearance of the stomach is normal. No pathologic dilatation of small bowel or colon. Multiple loops of small bowel appear to extend into a large right inguinal hernia. The appendix is not confidently identified and may be surgically absent. Regardless, there are no inflammatory changes noted adjacent to the cecum to suggest the presence of an acute appendicitis at this time. Numerous  colonic diverticulae are noted, without surrounding inflammatory changes to suggest an acute diverticulitis at this time. Vascular/Lymphatic: Aortic atherosclerosis. No lymphadenopathy noted in the abdomen or pelvis confidently identified on today's noncontrast CT examination. Reproductive: Uterus is markedly heterogeneous in appearance with multiple coarse calcifications, compatible with a fibroid uterus. Ovaries are unremarkable in appearance. Other: Trace volume of ascites.  No pneumoperitoneum. Musculoskeletal: There are no aggressive appearing lytic or blastic lesions noted in the visualized portions of the skeleton. IMPRESSION: 1. Large right inguinal hernia containing several loops of small bowel, without evidence of bowel incarceration or obstruction. 2. Numerous small nonobstructive calculi are noted in the collecting systems of both kidneys measuring up to 4 mm in the lower pole. No ureteral stones or findings of urinary tract obstruction are noted at this time. 3. Colonic diverticulosis without evidence of acute diverticulitis at this time. 4. Severe cardiomegaly. 5. Aortic atherosclerosis, in addition to least 3 vessel coronary artery disease. Assessment for potential risk factor modification, dietary therapy or pharmacologic therapy may be warranted, if clinically indicated. 6. Morphologic changes in the liver suggestive of underlying cirrhosis. 7. Additional incidental findings, as above. Electronically Signed   By: Vinnie Langton M.D.   On: 05/26/2019 18:27   CT ANGIO HEAD W OR WO CONTRAST  Result Date: 05/31/2019 CLINICAL DATA:  Left-sided weakness EXAM: CT ANGIOGRAPHY HEAD AND NECK TECHNIQUE: Multidetector CT imaging of the head and neck was performed using the standard protocol during bolus administration of intravenous contrast. Multiplanar CT image reconstructions and MIPs were obtained to evaluate the vascular anatomy. Carotid stenosis measurements (when applicable) are obtained utilizing  NASCET criteria, using the distal internal carotid diameter as the denominator. CONTRAST:  151mL OMNIPAQUE IOHEXOL 350 MG/ML SOLN COMPARISON:  None. FINDINGS: CTA NECK FINDINGS Aortic arch: Great vessel origins are patent. Right carotid system: Common, internal, and external carotid arteries are patent. There is mild calcified plaque along the common carotid. Mild calcified plaque is present at the ICA origin, noting streak artifact through this region. There is no hemodynamically significant stenosis or evidence of dissection. Left carotid system: Common, internal, and external carotid arteries are patent. Mild calcified plaque is present along the common carotid. There is primarily calcified plaque at the bifurcation and ICA origin causing minimal stenosis, noting streak artifact through this region. There is no hemodynamically significant stenosis or evidence of dissection. Vertebral arteries: Dominant right vertebral artery is patent. Calcified plaque at its origin results in mild stenosis. Congenitally small caliber left vertebral artery is patent. Skeleton: Advanced degenerative changes of the cervical spine particularly from C4-C5 through C6-C7. Other neck: No neck mass or adenopathy. Diffuse paranasal sinus opacification with areas of hyperdensity likely reflecting inspissation with fungal colonization not excluded. Upper chest: No apical lung mass. Review of the MIP images confirms the above findings CTA HEAD  FINDINGS Anterior circulation: Intracranial internal carotid arteries are patent with calcified plaque along cavernous and paraclinoid portions causing mild stenosis. Anterior and middle cerebral arteries are patent. Posterior circulation: Intracranial vertebral arteries are patent. The left vertebral artery terminates as a PICA. Basilar artery is patent. Posterior cerebral arteries are patent. Posterior communicating arteries are present. Venous sinuses: Patent as permitted by contrast timing Anatomic  variants: Fetal origin of the right PCA. Review of the MIP images confirms the above findings IMPRESSION: No proximal intracranial vessel occlusion. No occlusion or hemodynamically significant stenosis in the neck. Electronically Signed   By: Macy Mis M.D.   On: 05/31/2019 15:56   DG Chest 2 View  Result Date: 05/26/2019 CLINICAL DATA:  Patient with right-sided abdominal pain. History of CHF. EXAM: CHEST - 2 VIEW COMPARISON:  Chest radiograph 02/20/2019 FINDINGS: Multi lead AICD device overlies the left hemithorax. Leads are stable in position. Stable cardiomegaly. Tortuosity of the thoracic aorta. Heterogeneous opacities left lung base. Pulmonary vascular redistribution. Trace bilateral pleural effusions. Thoracic spine degenerative changes. IMPRESSION: Cardiomegaly with pulmonary vascular redistribution and mild interstitial edema. Heterogeneous opacities left lung base may represent atelectasis. Probable trace bilateral pleural effusions. Electronically Signed   By: Lovey Newcomer M.D.   On: 05/26/2019 20:01   DG Chest 2 View  Result Date: 05/23/2019 CLINICAL DATA:  Shortness of breath EXAM: CHEST - 2 VIEW COMPARISON:  Radiograph 05/05/2019 FINDINGS: Cardiomegaly is similar to slightly increased from comparison AP radiography. Pacer/defibrillator pack overlies the left chest wall leads in the cardiac apex and right atrium. There is a mildly increased interstitial opacity with fissural and septal thickening and indistinct pulmonary vascularity. No consolidative opacity. Atelectatic changes in the lung bases. No visible pneumothorax or effusion. The osseous structures appear diffusely demineralized which may limit detection of small or nondisplaced fractures. No acute osseous or soft tissue abnormality. Degenerative changes are present in the imaged spine and shoulders. IMPRESSION: 1. Cardiomegaly with mild interstitial pulmonary edema, similar to slightly increased from prior study. 2. Bibasilar  atelectasis. No consolidation. Electronically Signed   By: Lovena Le M.D.   On: 05/23/2019 01:17   CT HEAD WO CONTRAST  Result Date: 06/04/2019 CLINICAL DATA:  Left-sided weakness EXAM: CT HEAD WITHOUT CONTRAST TECHNIQUE: Contiguous axial images were obtained from the base of the skull through the vertex without intravenous contrast. COMPARISON:  06/01/2019 FINDINGS: Brain: There is no acute intracranial hemorrhage, mass-effect, or edema. There is no new loss of gray-white differentiation. Patchy hypoattenuation in the supratentorial white matter is nonspecific but likely reflects stable chronic microvascular ischemic changes. There is no extra-axial fluid collection. Ventricles and sulci are stable in size and configuration. Vascular: There is atherosclerotic calcification at the skull base. Skull: Calvarium is unremarkable. Sinuses/Orbits: Persistent diffuse polypoid mucosal thickening with areas of increased density likely reflecting inspissation. Other: None. IMPRESSION: No significant change since 06/01/2019. Electronically Signed   By: Macy Mis M.D.   On: 06/04/2019 14:21   CT HEAD WO CONTRAST  Result Date: 06/01/2019 CLINICAL DATA:  Encephalopathy. Follow-up for question of left basal ganglia region stroke. EXAM: CT HEAD WITHOUT CONTRAST TECHNIQUE: Contiguous axial images were obtained from the base of the skull through the vertex without intravenous contrast. COMPARISON:  CT 05/31/2019, 05/30/2019 and 04/15/2019. FINDINGS: Brain: No acute finding. The brainstem and cerebellum appear normal. Cerebral hemispheres show mild chronic small-vessel ischemic change of the white matter. This includes an area of low-density in the anterior limb internal capsule on the left which was present in  October in therefore not acute. No sign of acute infarction, mass lesion, hemorrhage, hydrocephalus or extra-axial collection. Vascular: There is atherosclerotic calcification of the major vessels at the base of  the brain. Skull: Negative Sinuses/Orbits: Widespread sinus opacification consistent with rhinosinusitis. Orbits negative. Other: None IMPRESSION: No acute intracranial finding. Chronic small-vessel ischemic changes of the white matter. Low-density in the anterior limb internal capsule on the left question as acute on the recent study was, I think, present on 04/15/2019 and therefore not acute. Widespread rhinosinusitis. Electronically Signed   By: Nelson Chimes M.D.   On: 06/01/2019 11:12   CT HEAD WO CONTRAST  Result Date: 05/30/2019 CLINICAL DATA:  Vertigo EXAM: CT HEAD WITHOUT CONTRAST TECHNIQUE: Contiguous axial images were obtained from the base of the skull through the vertex without intravenous contrast. COMPARISON:  04/15/2019 FINDINGS: Brain: No acute intracranial abnormality. Specifically, no hemorrhage, hydrocephalus, mass lesion, acute infarction, or significant intracranial injury. Vascular: No hyperdense vessel or unexpected calcification. Skull: No acute calvarial abnormality. Sinuses/Orbits: Diffuse disease throughout the paranasal sinuses with extensive mucosal thickening. Near complete opacification of the maxillary, frontal and sphenoid sinuses. Mastoid air cells clear. Other: None IMPRESSION: No acute intracranial abnormality. Severe chronic sinusitis. Electronically Signed   By: Rolm Baptise M.D.   On: 05/30/2019 21:40   CT ANGIO NECK W OR WO CONTRAST  Result Date: 05/31/2019 CLINICAL DATA:  Left-sided weakness EXAM: CT ANGIOGRAPHY HEAD AND NECK TECHNIQUE: Multidetector CT imaging of the head and neck was performed using the standard protocol during bolus administration of intravenous contrast. Multiplanar CT image reconstructions and MIPs were obtained to evaluate the vascular anatomy. Carotid stenosis measurements (when applicable) are obtained utilizing NASCET criteria, using the distal internal carotid diameter as the denominator. CONTRAST:  187mL OMNIPAQUE IOHEXOL 350 MG/ML SOLN  COMPARISON:  None. FINDINGS: CTA NECK FINDINGS Aortic arch: Great vessel origins are patent. Right carotid system: Common, internal, and external carotid arteries are patent. There is mild calcified plaque along the common carotid. Mild calcified plaque is present at the ICA origin, noting streak artifact through this region. There is no hemodynamically significant stenosis or evidence of dissection. Left carotid system: Common, internal, and external carotid arteries are patent. Mild calcified plaque is present along the common carotid. There is primarily calcified plaque at the bifurcation and ICA origin causing minimal stenosis, noting streak artifact through this region. There is no hemodynamically significant stenosis or evidence of dissection. Vertebral arteries: Dominant right vertebral artery is patent. Calcified plaque at its origin results in mild stenosis. Congenitally small caliber left vertebral artery is patent. Skeleton: Advanced degenerative changes of the cervical spine particularly from C4-C5 through C6-C7. Other neck: No neck mass or adenopathy. Diffuse paranasal sinus opacification with areas of hyperdensity likely reflecting inspissation with fungal colonization not excluded. Upper chest: No apical lung mass. Review of the MIP images confirms the above findings CTA HEAD FINDINGS Anterior circulation: Intracranial internal carotid arteries are patent with calcified plaque along cavernous and paraclinoid portions causing mild stenosis. Anterior and middle cerebral arteries are patent. Posterior circulation: Intracranial vertebral arteries are patent. The left vertebral artery terminates as a PICA. Basilar artery is patent. Posterior cerebral arteries are patent. Posterior communicating arteries are present. Venous sinuses: Patent as permitted by contrast timing Anatomic variants: Fetal origin of the right PCA. Review of the MIP images confirms the above findings IMPRESSION: No proximal  intracranial vessel occlusion. No occlusion or hemodynamically significant stenosis in the neck. Electronically Signed   By: Addison Lank.D.  On: 05/31/2019 15:56   CARDIAC CATHETERIZATION  Result Date: 05/30/2019 Findings: RA = 8 RV = 58/12 PA = 67/13 (41) PCW = 24 Fick cardiac output/index = 6.4/4.0 PVR = 2.6 WU Ao sat = 99% PA sat = 67%, 68% High SVC sat = 69% Assessment: 1. Moderately elevated filling pressures with normal cardiac output Plan/Discussion: Continue IV diuresis. Given normal cardiac output would likely not benefit from inotropic support. Glori Bickers, MD 8:49 AM  US Abdomen Limited  Result Date: 06/05/2019 CLINICAL DATA:  Ascites EXAM: LIMITED ABDOMEN ULTRASOUND FOR ASCITES TECHNIQUE: Limited ultrasound survey for ascites was performed in all four abdominal quadrants. COMPARISON:  CT abdomen pelvis 06/01/2019 FINDINGS: Small volume ascites identified in the 4 quadrants. IMPRESSION: Scattered small volume ascites. Electronically Signed   By: Lavonia Dana M.D.   On: 06/05/2019 13:15   DG CHEST PORT 1 VIEW  Result Date: 06/05/2019 CLINICAL DATA:  Congestive heart failure. EXAM: PORTABLE CHEST 1 VIEW COMPARISON:  Single-view of the chest 05/31/2019. PA and lateral chest 05/26/2019. FINDINGS: There is cardiomegaly without edema. No consolidative process, pneumothorax or effusion. AICD is in place. IMPRESSION: Cardiomegaly without acute disease. Electronically Signed   By: Inge Rise M.D.   On: 06/05/2019 13:23   DG CHEST PORT 1 VIEW  Result Date: 05/31/2019 CLINICAL DATA:  Cough.  CHF. EXAM: PORTABLE CHEST 1 VIEW COMPARISON:  05/26/2019 FINDINGS: There is a left chest wall ICD with leads in the right atrial appendage and right ventricle. Cardiac enlargement. Aortic atherosclerosis. Pulmonary vascular congestion. No pleural effusion. No airspace opacities. IMPRESSION: 1. Similar appearance of cardiac enlargement and pulmonary vascular congestion. Electronically Signed   By:  Kerby Moors M.D.   On: 05/31/2019 19:42   EEG adult  Result Date: 05/31/2019 Lora Havens, MD     05/31/2019  4:41 PM Patient Name: STEPHANE JUNKINS MRN: [id number] Epilepsy Attending: Lora Havens Referring Physician/Provider: Dr Rosalin Hawking Date: 05/31/2019 Duration: 26.9mins Patient history: 73yo M with sudden onset of slurred speech and left sided weakness. EEG to evaluate for seizure Level of alertness: awake AEDs during EEG study: none Technical aspects: This EEG study was done with scalp electrodes positioned according to the 10-20 International system of electrode placement. Electrical activity was acquired at a sampling rate of 500Hz  and reviewed with a high frequency filter of 70Hz  and a low frequency filter of 1Hz . EEG data were recorded continuously and digitally stored. DESCRIPTION: No clear posterior dominant rhythm was seen. EEG showed continuous generalized polymorphic 3-6hz  theta-delta slowing. At times, generalized triphasic waves were also noted. Hyperventilation and photic stimulation were not performed. ABNORMALITY - Continuous slow, generalized - Triphasic waves, generalized IMPRESSION: This study is suggestive of moderate diffuse encephalopathy, non specific to etiology, could be secondary to toxic-metabolic causes. No seizures or definite epileptiform discharges were seen throughout the recording. Priyanka Barbra Sarks   CT HEAD CODE STROKE WO CONTRAST`  Result Date: 05/31/2019 CLINICAL DATA:  Code stroke.  Left-sided weakness EXAM: CT HEAD WITHOUT CONTRAST TECHNIQUE: Contiguous axial images were obtained from the base of the skull through the vertex without intravenous contrast. COMPARISON:  CT head 04/15/2019 FINDINGS: Brain: Ventricle size normal. Small hypodensity anterior limb internal capsule on the left unchanged from the prior study. Negative for acute infarct, hemorrhage, mass. No midline shift. Vascular: Negative for hyperdense vessel Skull: Negative Sinuses/Orbits:  Extensive mucosal edema throughout the paranasal sinuses. Negative orbit. Other: None ASPECTS ([place] Stroke Program Early CT Score) - Ganglionic level infarction (caudate, lentiform nuclei,  internal capsule, insula, M1-M3 cortex): 7 - Supraganglionic infarction (M4-M6 cortex): 3 Total score (0-10 with 10 being normal): 10 IMPRESSION: 1. No acute abnormality and no change from the prior study 2. ASPECTS is 10 Electronically Signed   By: Franchot Gallo M.D.   On: 05/31/2019 15:03      Discharge Exam: Vitals:   06/07/19 0337 06/07/19 0733  BP: 135/63 (!) 115/56  Pulse: 65 62  Resp: 20 17  Temp: 97.7 F (36.5 C)   SpO2: 98% 97%   Vitals:   06/06/19 2337 06/07/19 0337 06/07/19 0446 06/07/19 0733  BP: (!) 112/48 135/63  (!) 115/56  Pulse: 61 65  62  Resp: 16 20  17   Temp: 98.2 F (36.8 C) 97.7 F (36.5 C)    TempSrc: Oral Oral    SpO2: 100% 98%  97%  Weight:   61.9 kg   Height:        General: Pt is alert, awake, not in acute distress Cardiovascular: Irregularly irregular rate and rhythm, S1/S2 +, no rubs, no gallops Respiratory: Diminished breath sounds at the bases, no wheezing, no rhonchi Abdominal: Soft, NT, ND, bowel sounds + Extremities: no edema, no cyanosis    The results of significant diagnostics from this hospitalization (including imaging, microbiology, ancillary and laboratory) are listed below for reference.     Microbiology: Recent Results (from the past 240 hour(s))  Culture, Urine     Status: Abnormal   Collection Time: 06/01/19  7:41 PM   Specimen: Urine, Clean Catch  Result Value Ref Range Status   Specimen Description URINE, CLEAN CATCH  Final   Special Requests   Final    NONE Performed at [facility], [address], [place], [place] [address]    Culture >=100,000 COLONIES/mL KLEBSIELLA PNEUMONIAE (A)  Final   Report Status 06/04/2019 FINAL  Final   Organism ID, Bacteria KLEBSIELLA PNEUMONIAE (A)  Final      Susceptibility   Klebsiella  pneumoniae - MIC*    AMPICILLIN RESISTANT Resistant     CEFAZOLIN <=4 SENSITIVE Sensitive     CEFTRIAXONE <=1 SENSITIVE Sensitive     CIPROFLOXACIN <=0.25 SENSITIVE Sensitive     GENTAMICIN <=1 SENSITIVE Sensitive     IMIPENEM <=0.25 SENSITIVE Sensitive     NITROFURANTOIN <=16 SENSITIVE Sensitive     TRIMETH/SULFA <=20 SENSITIVE Sensitive     AMPICILLIN/SULBACTAM <=2 SENSITIVE Sensitive     PIP/TAZO <=4 SENSITIVE Sensitive     Extended ESBL NEGATIVE Sensitive     * >=100,000 COLONIES/mL KLEBSIELLA PNEUMONIAE  SARS CORONAVIRUS 2 (TAT 6-24 HRS) Nasopharyngeal Nasopharyngeal Swab     Status: None   Collection Time: 06/05/19  3:15 PM   Specimen: Nasopharyngeal Swab  Result Value Ref Range Status   SARS Coronavirus 2 NEGATIVE NEGATIVE Final    Comment: (NOTE) SARS-CoV-2 target nucleic acids are NOT DETECTED. The SARS-CoV-2 RNA is generally detectable in upper and lower respiratory specimens during the acute phase of infection. Negative results do not preclude SARS-CoV-2 infection, do not rule out co-infections with other pathogens, and should not be used as the sole basis for treatment or other patient management decisions. Negative results must be combined with clinical observations, patient history, and epidemiological information. The expected result is Negative. Fact Sheet for Patients: SugarRoll.be Fact Sheet for Healthcare Providers: https://www.woods-mathews.com/ This test is not yet approved or cleared by the [place] FDA and  has been authorized for detection and/or diagnosis of SARS-CoV-2 by FDA under an Emergency  Use Authorization (EUA). This EUA will remain  in effect (meaning this test can be used) for the duration of the COVID-19 declaration under Section 56 4(b)(1) of the Act, 21 U.S.C. section 360bbb-3(b)(1), unless the authorization is terminated or revoked sooner. Performed at [facility], [place] [address]  [place], [place] [address]      Labs: BNP (last 3 results) Recent Labs    05/05/19 1849 05/23/19 0057 05/26/19 1822  BNP 1,012.9* 1,286.3* 4,540.9*   Basic Metabolic Panel: Recent Labs  Lab 06/01/19 0317 06/02/19 0419 06/03/19 0431 06/04/19 0334 06/05/19 0243 06/06/19 0405 06/07/19 0153  NA 138 139 137 138 139 139 139  K 5.0 3.9 4.0 3.9 3.8 4.4 3.4*  CL 105 105 100 102 102 104 105  CO2 23 26 25 25 25 23 25   GLUCOSE 74 101* 116* 98 94 91 104*  BUN 37* 35* 35* 34* 32* 33* 33*  CREATININE 2.04* 2.12* 2.36* 2.35* 2.31* 2.32* 2.39*  CALCIUM 8.1* 8.3* 8.4* 8.2* 8.4* 8.2* 8.2*  MG 2.3 2.3 2.4  --   --   --   --    Liver Function Tests: Recent Labs  Lab 06/03/19 0431 06/04/19 0334 06/05/19 0243 06/06/19 0405 06/07/19 0153  AST 92* 114* 111* 99* 82*  ALT 40 42 45* 40 36  ALKPHOS 122 132* 129* 120 110  BILITOT 1.3* 1.3* 1.5* 1.0 1.5*  PROT 6.8 6.2* 6.5 6.2* 6.0*  ALBUMIN 1.9* 1.7* 1.7* 1.8* 1.7*   No results for input(s): LIPASE, AMYLASE in the last 168 hours. Recent Labs  Lab 06/01/19 0948 06/02/19 0419 06/03/19 0433 06/04/19 0642 06/05/19 0444  AMMONIA 79* 50* 79* 124* 33   CBC: Recent Labs  Lab 06/04/19 0334 06/05/19 0243 06/06/19 0405 06/06/19 1129 06/07/19 0153  WBC 15.4* 16.3* 17.7* 24.2* 16.5*  NEUTROABS 11.6* 12.5* 13.7* 19.1* 13.0*  HGB 7.7* 8.1* 7.1* 8.3* 7.3*  HCT 22.6* 24.7* 21.3* 25.2* 22.2*  MCV 76.6* 78.9* 77.5* 77.5* 77.9*  PLT 247 235 209 381 218   Cardiac Enzymes: No results for input(s): CKTOTAL, CKMB, CKMBINDEX, TROPONINI in the last 168 hours. BNP: Invalid input(s): POCBNP CBG: Recent Labs  Lab 06/06/19 0729 06/06/19 1124 06/06/19 1545 06/06/19 2056 06/07/19 0731  GLUCAP 92 106* 100* 96 93   D-Dimer No results for input(s): DDIMER in the last 72 hours. Hgb A1c No results for input(s): HGBA1C in the last 72 hours. Lipid Profile No results for input(s): CHOL, HDL, LDLCALC, TRIG, CHOLHDL, LDLDIRECT in the last 72  hours. Thyroid function studies No results for input(s): TSH, T4TOTAL, T3FREE, THYROIDAB in the last 72 hours.  Invalid input(s): FREET3 Anemia work up No results for input(s): VITAMINB12, FOLATE, FERRITIN, TIBC, IRON, RETICCTPCT in the last 72 hours. Urinalysis    Component Value Date/Time   COLORURINE AMBER (A) 06/01/2019 1954   APPEARANCEUR CLOUDY (A) 06/01/2019 1954   LABSPEC 1.020 06/01/2019 1954   PHURINE 5.0 06/01/2019 1954   GLUCOSEU NEGATIVE 06/01/2019 1954   HGBUR SMALL (A) 06/01/2019 [place] NEGATIVE 06/01/2019 [place] NEGATIVE 06/01/2019 1954   PROTEINUR 30 (A) 06/01/2019 1954   UROBILINOGEN 1.0 08/29/2014 2035   NITRITE NEGATIVE 06/01/2019 1954   LEUKOCYTESUR TRACE (A) 06/01/2019 1954   Sepsis Labs Invalid input(s): PROCALCITONIN,  WBC,  LACTICIDVEN Microbiology Recent Results (from the past 240 hour(s))  Culture, Urine     Status: Abnormal   Collection Time: 06/01/19  7:41 PM   Specimen: Urine, Clean Catch  Result  Value Ref Range Status   Specimen Description URINE, CLEAN CATCH  Final   Special Requests   Final    NONE Performed at [facility], [place] [address], [place], [place] [address]    Culture >=100,000 COLONIES/mL KLEBSIELLA PNEUMONIAE (A)  Final   Report Status 06/04/2019 FINAL  Final   Organism ID, Bacteria KLEBSIELLA PNEUMONIAE (A)  Final      Susceptibility   Klebsiella pneumoniae - MIC*    AMPICILLIN RESISTANT Resistant     CEFAZOLIN <=4 SENSITIVE Sensitive     CEFTRIAXONE <=1 SENSITIVE Sensitive     CIPROFLOXACIN <=0.25 SENSITIVE Sensitive     GENTAMICIN <=1 SENSITIVE Sensitive     IMIPENEM <=0.25 SENSITIVE Sensitive     NITROFURANTOIN <=16 SENSITIVE Sensitive     TRIMETH/SULFA <=20 SENSITIVE Sensitive     AMPICILLIN/SULBACTAM <=2 SENSITIVE Sensitive     PIP/TAZO <=4 SENSITIVE Sensitive     Extended ESBL NEGATIVE Sensitive     * >=100,000 COLONIES/mL KLEBSIELLA PNEUMONIAE  SARS CORONAVIRUS 2 (TAT 6-24 HRS)  Nasopharyngeal Nasopharyngeal Swab     Status: None   Collection Time: 06/05/19  3:15 PM   Specimen: Nasopharyngeal Swab  Result Value Ref Range Status   SARS Coronavirus 2 NEGATIVE NEGATIVE Final    Comment: (NOTE) SARS-CoV-2 target nucleic acids are NOT DETECTED. The SARS-CoV-2 RNA is generally detectable in upper and lower respiratory specimens during the acute phase of infection. Negative results do not preclude SARS-CoV-2 infection, do not rule out co-infections with other pathogens, and should not be used as the sole basis for treatment or other patient management decisions. Negative results must be combined with clinical observations, patient history, and epidemiological information. The expected result is Negative. Fact Sheet for Patients: SugarRoll.be Fact Sheet for Healthcare Providers: https://www.woods-mathews.com/ This test is not yet approved or cleared by the [place] FDA and  has been authorized for detection and/or diagnosis of SARS-CoV-2 by FDA under an Emergency Use Authorization (EUA). This EUA will remain  in effect (meaning this test can be used) for the duration of the COVID-19 declaration under Section 56 4(b)(1) of the Act, 21 U.S.C. section 360bbb-3(b)(1), unless the authorization is terminated or revoked sooner. Performed at [facility], [place] [address] [place], [place] [address]      Time coordinating discharge: Over 30 minutes  SIGNED:   Darliss Cheney, MD  [facility] 06/07/2019, 9:48 AM  If 7PM-7AM, please contact night-coverage [email or web address] Password [email or web address]

## 2019-06-07 NOTE — Procedures (Addendum)
Patient Name: Stephanie Yates  MRN: [id number]  Epilepsy Attending: Lora Havens  Referring Physician/Provider: Dr Darliss Cheney Date: 06/07/2019 Duration: 22.63mins  Patient history: 73yo M with sudden onset of slurred speech and left sided weakness. EEG to evaluate for seizure  Level of alertness: awake  AEDs during EEG study: None  Technical aspects: This EEG study was done with scalp electrodes positioned according to the 10-20 International system of electrode placement. Electrical activity was acquired at a sampling rate of 500Hz  and reviewed with a high frequency filter of 70Hz  and a low frequency filter of 1Hz . EEG data were recorded continuously and digitally stored.   DESCRIPTION: No clear posterior dominant rhythm was seen. EEG showed continuous generalized polymorphic 3-6hz  theta-delta slowing. Sharp transient was seen in right frontotemporal region. Hyperventilation and photic stimulation were not performed.  ABNORMALITY - Continuous slow, generalized  IMPRESSION: This study is suggestive of moderate diffuse encephalopathy, non specific to etiology. No seizures or definite epileptiform discharges were seen throughout the recording.  If suspicion for interictal activity remains high, consider long term eeg monitoring.     [name]

## 2019-06-07 NOTE — Progress Notes (Signed)
Physical Therapy Treatment Patient Details Name: Stephanie Yates MRN: [id number] DOB: October 05, 1945 Today's Date: 06/07/2019    History of Present Illness pt is a 73 yo female presented to ED with c/o abdominal pain, B LE swelling. Several recent admissions for B LE swelling, SOB, chronic large R inguinal hernia. pt with vision changes and dizziness, Head CT 12/3- unremarkable. PMH: CHF, HTN, afib, HLD, CKD    PT Comments    Patient received laying diagonally across the bed, garbled speech and much more weak/incoherent today- per hall staff, medical team aware and she is still likely discharging today, request PT work with her this afternoon. She required MaxAx2 to get to sitting as well as MinA to maintain balance at EOB. Did attempt standing, patient purposefully let her arms go limp and provided no lifting assistance, unable to stand. She was returned to bed with MaxAX2 and positioned to comfort, all needs otherwise met. Continues to have very odd and inconsistent presentation. Continue to recommend SNF moving forward.     Follow Up Recommendations  SNF;Supervision/Assistance - 24 hour     Equipment Recommendations  Rolling walker with 5" wheels;3in1 (PT)    Recommendations for Other Services       Precautions / Restrictions Precautions Precautions: Fall Precaution Comments: watch 02 Restrictions Weight Bearing Restrictions: No    Mobility  Bed Mobility Overal bed mobility: Needs Assistance Bed Mobility: Supine to Sit;Sit to Supine     Supine to sit: Max assist;+2 for physical assistance Sit to supine: Max assist;+2 for physical assistance   General bed mobility comments: patient with great difficulty following commands today, maxAx2 for all mobility and MinA to maintain upright balance  Transfers                 General transfer comment: attempted, patient let her arms go limp like a ragdoll so PT/tech could not help lift her and did not attempt to assist, verbally  refused  Ambulation/Gait             General Gait Details: deferred, refusal   Stairs             Wheelchair Mobility    Modified Rankin (Stroke Patients Only)       Balance Overall balance assessment: Needs assistance Sitting-balance support: Bilateral upper extremity supported;Feet supported Sitting balance-Leahy Scale: Poor Sitting balance - Comments: MinA to maintain upright Postural control: Posterior lean                                  Cognition Arousal/Alertness: Awake/alert Behavior During Therapy: Flat affect Overall Cognitive Status: Impaired/Different from baseline Area of Impairment: Attention;Memory;Following commands;Safety/judgement;Problem solving;Orientation                 Orientation Level: Disoriented to;Place;Time;Situation Current Attention Level: Focused Memory: Decreased short-term memory Following Commands: Follows one step commands with increased time;Follows one step commands inconsistently Safety/Judgement: Decreased awareness of deficits;Decreased awareness of safety   Problem Solving: Difficulty sequencing;Decreased initiation;Requires verbal cues;Slow processing;Requires tactile cues General Comments: cognition very different today from previous sessions, garbled speech and difficulty following commands      Exercises      General Comments        Pertinent Vitals/Pain Pain Assessment: Faces Pain Score: 0-No pain Faces Pain Scale: No hurt Pain Intervention(s): Limited activity within patient's tolerance;Monitored during session    Home Living  Prior Function            PT Goals (current goals can now be found in the care plan section) Acute Rehab PT Goals Patient Stated Goal: get strong enough to go home PT Goal Formulation: With patient Time For Goal Achievement: 06/18/19 Potential to Achieve Goals: Fair Progress towards PT goals: Not progressing toward goals  - comment(limited presentation today)    Frequency    Min 3X/week      PT Plan Current plan remains appropriate    Co-evaluation              AM-PAC PT "6 Clicks" Mobility   Outcome Measure  Help needed turning from your back to your side while in a flat bed without using bedrails?: A Lot Help needed moving from lying on your back to sitting on the side of a flat bed without using bedrails?: A Lot Help needed moving to and from a bed to a chair (including a wheelchair)?: Total Help needed standing up from a chair using your arms (e.g., wheelchair or bedside chair)?: A Lot Help needed to walk in hospital room?: Total Help needed climbing 3-5 steps with a railing? : Total 6 Click Score: 9    End of Session Equipment Utilized During Treatment: Gait belt Activity Tolerance: Patient limited by fatigue Patient left: in bed;with call bell/phone within reach;with bed alarm set   PT Visit Diagnosis: Muscle weakness (generalized) (M62.81);Difficulty in walking, not elsewhere classified (R26.2);Hemiplegia and hemiparesis Hemiplegia - Right/Left: Left Hemiplegia - dominant/non-dominant: Non-dominant Hemiplegia - caused by: Unspecified Pain - Right/Left: (bilateral) Pain - part of body: (abdomen)     Time: 9518-8416 PT Time Calculation (min) (ACUTE ONLY): 10 min  Charges:  $Therapeutic Activity: 8-22 mins                     Windell Norfolk, DPT, PN1   Supplemental Physical Therapist [place]    Pager [phone] Acute Rehab Office [phone]

## 2019-06-07 NOTE — Plan of Care (Signed)
D/Cd to [facility] rehab, AVS sent to facility with EMS, left facility via stretcher per EMS.

## 2019-06-07 NOTE — Plan of Care (Signed)

## 2019-06-08 LAB — GLUCOSE, CAPILLARY: Glucose-Capillary: 119 mg/dL — ABNORMAL HIGH (ref 70–99)

## 2019-06-09 ENCOUNTER — Encounter (HOSPITAL_COMMUNITY): Payer: Self-pay | Admitting: Emergency Medicine

## 2019-06-09 ENCOUNTER — Emergency Department (HOSPITAL_COMMUNITY): Payer: Medicare Other

## 2019-06-09 ENCOUNTER — Other Ambulatory Visit: Payer: Self-pay

## 2019-06-09 ENCOUNTER — Inpatient Hospital Stay (HOSPITAL_COMMUNITY)
Admission: EM | Admit: 2019-06-09 | Discharge: 2019-06-17 | DRG: 291 | Disposition: A | Discharge status: Home Health | Payer: Medicare Other | Source: Other Acute Inpatient Hospital | Attending: Internal Medicine | Admitting: Internal Medicine

## 2019-06-09 DIAGNOSIS — D649 Anemia, unspecified: Secondary | ICD-10-CM | POA: Diagnosis not present

## 2019-06-09 DIAGNOSIS — R531 Weakness: Secondary | ICD-10-CM

## 2019-06-09 DIAGNOSIS — E8809 Other disorders of plasma-protein metabolism, not elsewhere classified: Secondary | ICD-10-CM | POA: Diagnosis present

## 2019-06-09 DIAGNOSIS — R627 Adult failure to thrive: Secondary | ICD-10-CM | POA: Diagnosis not present

## 2019-06-09 DIAGNOSIS — Z66 Do not resuscitate: Secondary | ICD-10-CM | POA: Diagnosis not present

## 2019-06-09 DIAGNOSIS — N1831 Chronic kidney disease, stage 3a: Secondary | ICD-10-CM | POA: Diagnosis not present

## 2019-06-09 DIAGNOSIS — I5023 Acute on chronic systolic (congestive) heart failure: Secondary | ICD-10-CM | POA: Diagnosis not present

## 2019-06-09 DIAGNOSIS — E440 Moderate protein-calorie malnutrition: Secondary | ICD-10-CM | POA: Diagnosis present

## 2019-06-09 DIAGNOSIS — K7460 Unspecified cirrhosis of liver: Secondary | ICD-10-CM | POA: Diagnosis present

## 2019-06-09 DIAGNOSIS — D631 Anemia in chronic kidney disease: Secondary | ICD-10-CM | POA: Diagnosis present

## 2019-06-09 DIAGNOSIS — E785 Hyperlipidemia, unspecified: Secondary | ICD-10-CM | POA: Diagnosis not present

## 2019-06-09 DIAGNOSIS — I428 Other cardiomyopathies: Secondary | ICD-10-CM | POA: Diagnosis not present

## 2019-06-09 DIAGNOSIS — I5043 Acute on chronic combined systolic (congestive) and diastolic (congestive) heart failure: Secondary | ICD-10-CM | POA: Diagnosis present

## 2019-06-09 DIAGNOSIS — Z95810 Presence of automatic (implantable) cardiac defibrillator: Secondary | ICD-10-CM | POA: Diagnosis present

## 2019-06-09 DIAGNOSIS — I509 Heart failure, unspecified: Secondary | ICD-10-CM

## 2019-06-09 DIAGNOSIS — Z7901 Long term (current) use of anticoagulants: Secondary | ICD-10-CM

## 2019-06-09 DIAGNOSIS — R778 Other specified abnormalities of plasma proteins: Secondary | ICD-10-CM | POA: Diagnosis not present

## 2019-06-09 DIAGNOSIS — Z87891 Personal history of nicotine dependence: Secondary | ICD-10-CM

## 2019-06-09 DIAGNOSIS — Z20828 Contact with and (suspected) exposure to other viral communicable diseases: Secondary | ICD-10-CM | POA: Diagnosis present

## 2019-06-09 DIAGNOSIS — E782 Mixed hyperlipidemia: Secondary | ICD-10-CM | POA: Diagnosis not present

## 2019-06-09 DIAGNOSIS — N184 Chronic kidney disease, stage 4 (severe): Secondary | ICD-10-CM | POA: Diagnosis present

## 2019-06-09 DIAGNOSIS — F40240 Claustrophobia: Secondary | ICD-10-CM | POA: Diagnosis present

## 2019-06-09 DIAGNOSIS — G9340 Encephalopathy, unspecified: Secondary | ICD-10-CM | POA: Diagnosis present

## 2019-06-09 DIAGNOSIS — Z6826 Body mass index (BMI) 26.0-26.9, adult: Secondary | ICD-10-CM

## 2019-06-09 DIAGNOSIS — Z91018 Allergy to other foods: Secondary | ICD-10-CM

## 2019-06-09 DIAGNOSIS — D72828 Other elevated white blood cell count: Secondary | ICD-10-CM | POA: Diagnosis not present

## 2019-06-09 DIAGNOSIS — Z515 Encounter for palliative care: Secondary | ICD-10-CM | POA: Diagnosis present

## 2019-06-09 DIAGNOSIS — R7989 Other specified abnormal findings of blood chemistry: Secondary | ICD-10-CM | POA: Diagnosis present

## 2019-06-09 DIAGNOSIS — I472 Ventricular tachycardia: Secondary | ICD-10-CM | POA: Diagnosis not present

## 2019-06-09 DIAGNOSIS — R188 Other ascites: Secondary | ICD-10-CM | POA: Diagnosis present

## 2019-06-09 DIAGNOSIS — I5084 End stage heart failure: Secondary | ICD-10-CM | POA: Diagnosis present

## 2019-06-09 DIAGNOSIS — Z79899 Other long term (current) drug therapy: Secondary | ICD-10-CM

## 2019-06-09 DIAGNOSIS — I480 Paroxysmal atrial fibrillation: Secondary | ICD-10-CM | POA: Diagnosis not present

## 2019-06-09 DIAGNOSIS — I10 Essential (primary) hypertension: Secondary | ICD-10-CM | POA: Diagnosis present

## 2019-06-09 DIAGNOSIS — I130 Hypertensive heart and chronic kidney disease with heart failure and stage 1 through stage 4 chronic kidney disease, or unspecified chronic kidney disease: Secondary | ICD-10-CM | POA: Diagnosis present

## 2019-06-09 DIAGNOSIS — I4729 Other ventricular tachycardia: Secondary | ICD-10-CM

## 2019-06-09 DIAGNOSIS — I340 Nonrheumatic mitral (valve) insufficiency: Secondary | ICD-10-CM | POA: Diagnosis present

## 2019-06-09 DIAGNOSIS — D72829 Elevated white blood cell count, unspecified: Secondary | ICD-10-CM | POA: Diagnosis present

## 2019-06-09 DIAGNOSIS — N183 Chronic kidney disease, stage 3 unspecified: Secondary | ICD-10-CM | POA: Diagnosis not present

## 2019-06-09 LAB — CBC WITH DIFFERENTIAL/PLATELET
Abs Immature Granulocytes: 0.11 10*3/uL — ABNORMAL HIGH (ref 0.00–0.07)
Basophils Absolute: 0.1 10*3/uL (ref 0.0–0.1)
Basophils Relative: 0 %
Eosinophils Absolute: 0.1 10*3/uL (ref 0.0–0.5)
Eosinophils Relative: 1 %
HCT: 26.1 % — ABNORMAL LOW (ref 36.0–46.0)
Hemoglobin: 8.4 g/dL — ABNORMAL LOW (ref 12.0–15.0)
Immature Granulocytes: 1 %
Lymphocytes Relative: 6 %
Lymphs Abs: 1.1 10*3/uL (ref 0.7–4.0)
MCH: 25.4 pg — ABNORMAL LOW (ref 26.0–34.0)
MCHC: 32.2 g/dL (ref 30.0–36.0)
MCV: 78.9 fL — ABNORMAL LOW (ref 80.0–100.0)
Monocytes Absolute: 1.5 10*3/uL — ABNORMAL HIGH (ref 0.1–1.0)
Monocytes Relative: 8 %
Neutro Abs: 15.3 10*3/uL — ABNORMAL HIGH (ref 1.7–7.7)
Neutrophils Relative %: 84 %
Platelets: 284 10*3/uL (ref 150–400)
RBC: 3.31 MIL/uL — ABNORMAL LOW (ref 3.87–5.11)
RDW: 16.2 % — ABNORMAL HIGH (ref 11.5–15.5)
WBC: 18.1 10*3/uL — ABNORMAL HIGH (ref 4.0–10.5)
nRBC: 0 % (ref 0.0–0.2)

## 2019-06-09 LAB — URINALYSIS, ROUTINE W REFLEX MICROSCOPIC
Bilirubin Urine: NEGATIVE
Glucose, UA: NEGATIVE mg/dL
Ketones, ur: NEGATIVE mg/dL
Leukocytes,Ua: NEGATIVE
Nitrite: NEGATIVE
Protein, ur: NEGATIVE mg/dL
Specific Gravity, Urine: 1.013 (ref 1.005–1.030)
pH: 5 (ref 5.0–8.0)

## 2019-06-09 LAB — BASIC METABOLIC PANEL
Anion gap: 8 (ref 5–15)
BUN: 39 mg/dL — ABNORMAL HIGH (ref 8–23)
CO2: 25 mmol/L (ref 22–32)
Calcium: 8.4 mg/dL — ABNORMAL LOW (ref 8.9–10.3)
Chloride: 105 mmol/L (ref 98–111)
Creatinine, Ser: 2.86 mg/dL — ABNORMAL HIGH (ref 0.44–1.00)
GFR calc Af Amer: 18 mL/min — ABNORMAL LOW (ref >60)
GFR calc non Af Amer: 15 mL/min — ABNORMAL LOW (ref >60)
Glucose, Bld: 158 mg/dL — ABNORMAL HIGH (ref 70–99)
Potassium: 4.9 mmol/L (ref 3.5–5.1)
Sodium: 138 mmol/L (ref 135–145)

## 2019-06-09 LAB — SARS CORONAVIRUS 2 (TAT 6-24 HRS): SARS Coronavirus 2: NEGATIVE

## 2019-06-09 LAB — TROPONIN I (HIGH SENSITIVITY)
Troponin I (High Sensitivity): 31 ng/L — ABNORMAL HIGH (ref <18)
Troponin I (High Sensitivity): 28 ng/L — ABNORMAL HIGH (ref <18)

## 2019-06-09 LAB — POC SARS CORONAVIRUS 2 AG -  ED: SARS Coronavirus 2 Ag: NEGATIVE

## 2019-06-09 LAB — BRAIN NATRIURETIC PEPTIDE: B Natriuretic Peptide: 2039.7 pg/mL — ABNORMAL HIGH (ref 0.0–100.0)

## 2019-06-09 LAB — AMMONIA: Ammonia: 44 umol/L — ABNORMAL HIGH (ref 9–35)

## 2019-06-09 MED ORDER — FUROSEMIDE 10 MG/ML IJ SOLN
80.0000 mg | Freq: Once | INTRAMUSCULAR | Status: AC
  Administered 2019-06-09: 80 mg via INTRAVENOUS
  Filled 2019-06-09: qty 8

## 2019-06-09 MED ORDER — ALBUMIN HUMAN 25 % IV SOLN
12.5000 g | Freq: Once | INTRAVENOUS | Status: AC
  Administered 2019-06-09: 12.5 g via INTRAVENOUS
  Filled 2019-06-09: qty 50

## 2019-06-09 MED ORDER — FUROSEMIDE 10 MG/ML IJ SOLN
80.0000 mg | Freq: Two times a day (BID) | INTRAMUSCULAR | Status: DC
  Administered 2019-06-10 – 2019-06-13 (×7): 80 mg via INTRAVENOUS
  Filled 2019-06-09 (×7): qty 8

## 2019-06-09 NOTE — H&P (Signed)
Stephanie Yates IBB:[id number] DOB: [birth year] DOA: 06/09/2019     PCP: Josetta Huddle, MD   Outpatient Specialists:   CARDS:  Dr. Haroldine Laws   GI  Dr.  Alessandra Bevels     Patient arrived to ER on 06/09/19 at 1451  Patient coming from:   From facility [facility].  Chief Complaint:   Chief Complaint  Patient presents with  . Shortness of Breath    HPI: Stephanie Yates is a 73 y.o. female with medical history significant of  CHF/NICM (EF 10 to 15%), VT s/p AICD 2014 and biventricular PPM, paroxysmal A. fib/bradycardia, chronic anemia, CKD-4, HTN, HLD, moderate protein calorie malnutrition and GIB, prolonged QT/torsade and multiple hospitalizations    Presented with increased shortness of breath and lethargy patient recently discharged from hospital she have had a prolonged hospital stay initially admitted on 29 November for what appears to be fluid overload.  Secondary to CHF exacerbation. FOBT was found to be positive she have been evaluated by Dr. Charlesetta Ivory of [facility] given recurrent EGDs and colonoscopies in the past at that time further studies will hold off.  GI recommended as needed blood and iron transfusion.  Given her history of heart failure heart failure team was consulted patient was diuresed with IV Lasix undergone right heart catheterization on 3 December which showed moderately elevated filling pressures with normal cardiac output.  She had multiple neurological events with multiple CT scans of the head that showed no evidence of acute CVA she was not a candidate for MRI given history of AICD for history of V. tach. Eventually thought that her neurological abnormalities which include lethargy and occasional dizziness dysarthria left-sided arm weakness possibly secondary to seizure activity.  EEG was done and is appear to form discharge.  But it was found that her ammonia was elevated to 105 she was treated with lactulose and seemed to have improved.  Unfortunately  her left-sided weakness have recurred and she had to have another EEG done still showing no epithelial activity but just diffuse moderate encephalopathy  She was assessed by PT OT and recommended initial home health but patient was reassessed and PT felt that she would better benefit from SNF   she was discharged on 11 December  To [facility]  Patient has known history of intermittent GI blood loss her Eliquis was held at the time of discharge he was restarted.  During hospitalization urine culture grew Klebsiella Patient was not symptomatic   no antibiotics were given secondary to that   Infectious risk factors:  Reports shortness of breath     In  ER RAPID COVID TEST  NEGATIVE   in house  PCR testing  Pending  Lab Results  Component Value Date   [place] 06/05/2019   [place] NEGATIVE 05/26/2019   [place] NEGATIVE 05/05/2019   [place] NEGATIVE 04/15/2019     Regarding pertinent Chronic problems:     Hyperlipidemia -  on statins Lipitor  HTN on Coreg   chronic CHF systolic  - last echo 4/50/3888 estimated ejection fraction of 15%. Left atrial size was moderately dilated  The cavity size was mildly dilated.     A. Fib -  - CHA2DS2 vas score >3 :  current  on anticoagulation with  Eliquis,           -  Rate control:  Currently controlled with  Coreg          - Rhythm control:  amiodarone,  CKD stage III - baseline Cr 2.4 Lab Results  Component Value Date   CREATININE 2.86 (H) 06/09/2019   CREATININE 2.39 (H) 06/07/2019   CREATININE 2.32 (H) 06/06/2019    While in ER:  Chest x-ray was worrisome for fluid overload she was given a dose of Lasix 80 mg with minimal diuresis she is currently on 2 L oxygen   ER Provider Called:  Cardiology    Dr.Barnett   They Recommend admit to medicine    The following Work up has been ordered so far:  Orders Placed This Encounter  Procedures  . SARS CORONAVIRUS 2 (TAT 6-24 HRS) Nasopharyngeal  Nasopharyngeal Swab  . DG Chest Port 1 View  . Basic metabolic panel  . CBC with Differential  . Brain natriuretic peptide  . Ammonia  . Inpatient consult to Cardiology  ALL PATIENTS BEING ADMITTED/HAVING PROCEDURES NEED COVID-19 SCREENING  . Consult to hospitalist  ALL PATIENTS BEING ADMITTED/HAVING PROCEDURES NEED COVID-19 SCREENING  . Airborne and Contact precautions  . POC SARS Coronavirus 2 Ag-ED - Nasal Swab (BD Veritor Kit)  . EKG 12-Lead    lowing Medications were ordered in ER: Medications  furosemide (LASIX) injection 80 mg (80 mg Intravenous Given 06/09/19 1638)        Consult Orders  (From admission, onward)         Start     Ordered   06/09/19 1758  Consult to hospitalist  ALL PATIENTS BEING ADMITTED/HAVING PROCEDURES NEED COVID-19 SCREENING Pg sent by dee  Once    Comments: ALL PATIENTS BEING ADMITTED/HAVING PROCEDURES NEED COVID-19 SCREENING  Provider:  (Not yet assigned)  Question Answer Comment  Place call to: [facility]   Reason for Consult Admit      06/09/19 1757           Significant initial  Findings: Abnormal Labs Reviewed  BASIC METABOLIC PANEL - Abnormal; Notable for the following components:      Result Value   Glucose, Bld 158 (*)    BUN 39 (*)    Creatinine, Ser 2.86 (*)    Calcium 8.4 (*)    GFR calc non Af Amer 15 (*)    GFR calc Af Amer 18 (*)    All other components within normal limits  CBC WITH DIFFERENTIAL/PLATELET - Abnormal; Notable for the following components:   WBC 18.1 (*)    RBC 3.31 (*)    Hemoglobin 8.4 (*)    HCT 26.1 (*)    MCV 78.9 (*)    MCH 25.4 (*)    RDW 16.2 (*)    Neutro Abs 15.3 (*)    Monocytes Absolute 1.5 (*)    Abs Immature Granulocytes 0.11 (*)    All other components within normal limits  BRAIN NATRIURETIC PEPTIDE - Abnormal; Notable for the following components:   B Natriuretic Peptide 2,039.7 (*)    All other components within normal limits  TROPONIN I (HIGH SENSITIVITY) - Abnormal;  Notable for the following components:   Troponin I (High Sensitivity) 31 (*)    All other components within normal limits  TROPONIN I (HIGH SENSITIVITY) - Abnormal; Notable for the following components:   Troponin I (High Sensitivity) 28 (*)    All other components within normal limits    Otherwise labs showing:    Recent Labs  Lab 06/03/19 0431 06/04/19 0334 06/05/19 0243 06/06/19 0405 06/07/19 0153 06/09/19 1541  NA 137 138 139 139 139 138  K 4.0 3.9 3.8 4.4 3.4*  4.9  CO2 25 25 25 23 25 25   GLUCOSE 116* 98 94 91 104* 158*  BUN 35* 34* 32* 33* 33* 39*  CREATININE 2.36* 2.35* 2.31* 2.32* 2.39* 2.86*  CALCIUM 8.4* 8.2* 8.4* 8.2* 8.2* 8.4*  MG 2.4  --   --   --   --   --     Cr     Up from baseline see below Lab Results  Component Value Date   CREATININE 2.86 (H) 06/09/2019   CREATININE 2.39 (H) 06/07/2019   CREATININE 2.32 (H) 06/06/2019    Recent Labs  Lab 06/03/19 0431 06/04/19 0334 06/05/19 0243 06/06/19 0405 06/07/19 0153  AST 92* 114* 111* 99* 82*  ALT 40 42 45* 40 36  ALKPHOS 122 132* 129* 120 110  BILITOT 1.3* 1.3* 1.5* 1.0 1.5*  PROT 6.8 6.2* 6.5 6.2* 6.0*  ALBUMIN 1.9* 1.7* 1.7* 1.8* 1.7*   Lab Results  Component Value Date   CALCIUM 8.4 (L) 06/09/2019   PHOS 2.0 (L) 04/18/2019     WBC     Component Value Date/Time   WBC 18.1 (H) 06/09/2019 1541   ANC    Component Value Date/Time   NEUTROABS 15.3 (H) 06/09/2019 1541   NEUTROABS 3.8 08/30/2017 1600   ALC No components found for: LYMPHAB    Plt: Lab Results  Component Value Date   PLT 284 06/09/2019   Lactic Acid, Venous    Component Value Date/Time   LATICACIDVEN 1.5 05/26/2019 1822    COVID-19 Labs  No results for input(s): DDIMER, FERRITIN, LDH, CRP in the last 72 hours.  Lab Results  Component Value Date   SARSCOV2NAA NEGATIVE 06/05/2019   [place] NEGATIVE 05/26/2019   SARSCOV2NAA NEGATIVE 05/05/2019   SARSCOV2NAA NEGATIVE 04/15/2019    Venous  Blood Gas  ordered  HG/HCT   stable,      Component Value Date/Time   HGB 8.4 (L) 06/09/2019 1541   HGB 11.4 12/19/2018 0906   HCT 26.1 (L) 06/09/2019 1541   HCT 36.4 12/19/2018 0906      Recent Labs  Lab 06/03/19 0433 06/04/19 0642 06/05/19 0444  AMMONIA 79* 124* 33      Troponin31- 28 Cardiac Panel (last 3 results) No results for input(s): CKTOTAL, CKMB, TROPONINI, RELINDX in the last 72 hours.     ECG: Ordered Personally reviewed by me showing: HR : 69 Rhythm:   Paced   no evidence of ischemic changes QTC 492   BNP (last 3 results) Recent Labs    05/23/19 0057 05/26/19 1822 06/09/19 1541  BNP 1,286.3* 1,587.2* 2,039.7*     DM  labs:  HbA1C: Recent Labs    05/31/19 0324  HGBA1C 5.6     CBG (last 3)  Recent Labs    06/06/19 2056 06/07/19 0731 06/07/19 1107  GLUCAP 96 93 94      UA   no evidence of UTI    Urine analysis:    Component Value Date/Time   COLORURINE YELLOW 06/09/2019 2006   APPEARANCEUR CLEAR 06/09/2019 2006   LABSPEC 1.013 06/09/2019 2006   PHURINE 5.0 06/09/2019 2006   GLUCOSEU NEGATIVE 06/09/2019 2006   HGBUR SMALL (A) 06/09/2019 2006   [place] NEGATIVE 06/09/2019 2006   [employer] NEGATIVE 06/09/2019 2006   [place] NEGATIVE 06/09/2019 2006   UROBILINOGEN 1.0 08/29/2014 2035   NITRITE NEGATIVE 06/09/2019 2006   LEUKOCYTESUR NEGATIVE 06/09/2019 2006       Ordered    CXR - chf   ED Triage  Vitals [06/09/19 1505]  Enc Vitals Group     BP (!) 145/65     Pulse Rate 60     Resp 20     Temp (!) 97.5 F (36.4 C)     Temp Source Oral     SpO2 100 %     Weight      Height      Head Circumference      Peak Flow      Pain Score      Pain Loc      Pain Edu?      Excl. in [place]?   LZJQ(73)@       Latest  Blood pressure (!) 114/50, pulse (!) 59, temperature (!) 97.5 F (36.4 C), temperature source Oral, resp. rate 15, SpO2 100 %.    Hospitalist was called for admission for chf EXACERBATION   Review of Systems:     Pertinent positives include: shortness of breath at rest, dyspnea on exertion,  Constitutional:  No weight loss, night sweats, Fevers, chills, fatigue, weight loss  HEENT:  No headaches, Difficulty swallowing,Tooth/dental problems,Sore throat,  No sneezing, itching, ear ache, nasal congestion, post nasal drip,  Cardio-vascular:  No chest pain, Orthopnea, PND, anasarca, dizziness, palpitations.no Bilateral lower extremity swelling  GI:  No heartburn, indigestion, abdominal pain, nausea, vomiting, diarrhea, change in bowel habits, loss of appetite, melena, blood in stool, hematemesis Resp:  no . No  No excess mucus, no productive cough, No non-productive cough, No coughing up of blood.No change in color of mucus.No wheezing. Skin:  no rash or lesions. No jaundice GU:  no dysuria, change in color of urine, no urgency or frequency. No straining to urinate.  No flank pain.  Musculoskeletal:  No joint pain or no joint swelling. No decreased range of motion. No back pain.  Psych:  No change in mood or affect. No depression or anxiety. No memory loss.  Neuro: no localizing neurological complaints, no tingling, no weakness, no double vision, no gait abnormality, no slurred speech, no confusion  All systems reviewed and apart from [place] all are negative  Past Medical History:   Past Medical History:  Diagnosis Date  . Chronic anemia   . Chronic systolic CHF (congestive heart failure) ([place])   . CKD (chronic kidney disease), stage IV ([place])   . Former tobacco use   . History of tobacco abuse   . Hyperlipidemia   . Hypertension   . Mitral regurgitation   . Nonischemic cardiomyopathy ([place])    a. EF 10-15% on 11/2012 cath with no significant CAD. b. EF 25% in 2019. b. EF 15% in 12/2018  . PAF (paroxysmal atrial fibrillation) ([place])   . Protein calorie malnutrition ([place])   . Quit consuming alcohol in remote past   . Sinus bradycardia 12/21/2012  . Ventricular tachycardia ([place]) 12/14/2012       Past Surgical History:  Procedure Laterality Date  . BIOPSY  01/22/2019   Procedure: BIOPSY;  Surgeon: Wilford Corner, MD;  Location: [place];  Service: Endoscopy;;  . COLONOSCOPY WITH PROPOFOL N/A 03/19/2019   Procedure: COLONOSCOPY WITH PROPOFOL;  Surgeon: Otis Brace, MD;  Location: [place];  Service: Gastroenterology;  Laterality: N/A;  . ESOPHAGOGASTRODUODENOSCOPY (EGD) WITH PROPOFOL N/A 06/18/2017   Procedure: ESOPHAGOGASTRODUODENOSCOPY (EGD) WITH PROPOFOL;  Surgeon: Ronnette Juniper, MD;  Location: [place];  Service: Gastroenterology;  Laterality: N/A;  . ESOPHAGOGASTRODUODENOSCOPY (EGD) WITH PROPOFOL N/A 01/22/2019   Procedure: ESOPHAGOGASTRODUODENOSCOPY (EGD) WITH PROPOFOL;  Surgeon: Wilford Corner, MD;  Location: [facility];  Service: Endoscopy;  Laterality: N/A;  . ESOPHAGOGASTRODUODENOSCOPY (EGD) WITH PROPOFOL N/A 03/19/2019   Procedure: ESOPHAGOGASTRODUODENOSCOPY (EGD) WITH PROPOFOL;  Surgeon: Otis Brace, MD;  Location: [facility];  Service: Gastroenterology;  Laterality: N/A;  . IMPLANTABLE CARDIOVERTER DEFIBRILLATOR IMPLANT  12/19/2012   St. Jude dual-chamber ICD, serial number [id number]  . IMPLANTABLE CARDIOVERTER DEFIBRILLATOR IMPLANT N/A 12/19/2012   Procedure: IMPLANTABLE CARDIOVERTER DEFIBRILLATOR IMPLANT;  Surgeon: Deboraha Sprang, MD;  Location: [facility] C[facility] LAB;  Service: Cardiovascular;  Laterality: N/A;  . LEFT AND RIGHT HEART CATHETERIZATION WITH CORONARY ANGIOGRAM N/A 12/17/2012   Procedure: LEFT AND RIGHT HEART CATHETERIZATION WITH CORONARY ANGIOGRAM;  Surgeon: Sherren Mocha, MD;  Location: [facility] CATH LAB;  Service: Cardiovascular;  Laterality: N/A;  . POLYPECTOMY  03/19/2019   Procedure: POLYPECTOMY;  Surgeon: Otis Brace, MD;  Location: [place] [facility];  Service: Gastroenterology;;  . RIGHT HEART CATH N/A 05/30/2019   Procedure: RIGHT HEART CATH;  Surgeon: Jolaine Artist, MD;  Location: [place] CV LAB;  Service: Cardiovascular;   Laterality: N/A;  . SCLEROTHERAPY  01/22/2019   Procedure: SCLEROTHERAPY;  Surgeon: Wilford Corner, MD;  Location: [facility] ENDOSCOPY;  Service: Endoscopy;;    Social History:  Ambulatory    states not ambulatory     reports that she has quit smoking. She has never used smokeless tobacco. She reports that she does not drink alcohol or use drugs.   Family History:   Family History  Problem Relation Age of Onset  . Cancer Mother     Allergies: Allergies  Allergen Reactions  . Strawberry Extract Hives, Itching and Swelling          Prior to Admission medications   Medication Sig Start Date End Date Taking? Authorizing Provider  acetaminophen (TYLENOL) 500 MG tablet Take 1,000 mg by mouth every 6 (six) hours as needed (pain).    [provider]  albuterol (VENTOLIN HFA) 108 (90 Base) MCG/ACT inhaler Inhale 2 puffs into the lungs every 4 (four) hours as needed for wheezing or shortness of breath.    [provider]  amiodarone (PACERONE) 200 MG tablet Take 1 tablet (200 mg total) by mouth 2 (two) times daily. 05/14/19 06/13/19  Guilford Shi, MD  apixaban (ELIQUIS) 2.5 MG TABS tablet Take 1 tablet (2.5 mg total) by mouth 2 (two) times daily. 03/19/19   Debbe Odea, MD  atorvastatin (LIPITOR) 40 MG tablet Take 1 tablet (40 mg total) by mouth daily at 6 PM. 07/05/18   Deboraha Sprang, MD  carvedilol (COREG) 3.125 MG tablet Take 1 tablet (3.125 mg total) by mouth 2 (two) times daily with a meal. 09/12/18   Deboraha Sprang, MD  Cholecalciferol (VITAMIN D-3) 25 MCG (1000 UT) CAPS Take 1,000 Units by mouth at bedtime.     [provider]  Ensure (ENSURE) Take 237 mLs by mouth daily.    [provider]  ferrous gluconate (FERGON) 324 MG tablet Take 324 mg by mouth 2 (two) times daily.  07/05/18   [provider]  furosemide (LASIX) 80 MG tablet Take 0.5 tablets (40 mg total) by mouth daily. 04/19/19   Nita Sells, MD  lactulose (CEPHULAC)  20 g packet Take 1 packet (20 g total) by mouth 2 (two) times daily. Hold for 3 or more than 3 bowel movements per day. 06/07/19 07/07/19  Darliss Cheney, MD  magnesium oxide (MAGNESIUM-OXIDE) 400 (241.3 Mg) MG tablet Take 1 tablet (400 mg total) by mouth daily. 05/14/19   Guilford Shi,  MD  mexiletine (MEXITIL) 200 MG capsule Take 1 capsule (200 mg total) by mouth 2 (two) times daily. 01/24/19   [name], PA-C  nitroGLYCERIN (NITROSTAT) 0.4 MG SL tablet Place 1 tablet (0.4 mg total) under the tongue every 5 (five) minutes as needed for chest pain. 01/12/19   Guilford Shi, MD  ondansetron (ZOFRAN ODT) 4 MG disintegrating tablet Take 1 tablet (4 mg total) by mouth every 8 (eight) hours as needed for nausea or vomiting. 03/21/19   [place] Donnamarie Poag, DO  pantoprazole (PROTONIX) 40 MG tablet Take 1 tablet (40 mg total) by mouth daily. 04/05/19 07/04/19  [place] Donnamarie Poag, DO  polyethylene glycol (MIRALAX / GLYCOLAX) 17 g packet Take 17 g by mouth daily. Can take up to twice a day for constipation. Patient not taking: Reported on 05/27/2019 03/19/19   Debbe Odea, MD  senna-docusate (SENOKOT-S) 8.6-50 MG tablet Take 1 tablet by mouth at bedtime as needed for mild constipation. 05/10/19   Charolotte Capuchin, MD  sucralfate (CARAFATE) 1 GM/10ML suspension Take 10 mLs (1 g total) by mouth 4 (four) times daily -  with meals and at bedtime. 03/19/19   Debbe Odea, MD   Physical Exam: Blood pressure (!) 114/50, pulse (!) 59, temperature (!) 97.5 F (36.4 C), temperature source Oral, resp. rate 15, SpO2 100 %. 1. General:  in No   Acute distress   Chronically ill   -appearing 2. Psychological: Alert and  Oriented 3. Head/ENT:   Moist  Mucous Membranes                          Head Non traumatic, neck supple                            Poor Dentition 4. SKIN:  decreased Skin turgor,  Skin clean Dry and intact no rash 5. Heart: Regular rate and rhythm no Murmur, no Rub or gallop 6. Lungs: occasional   wheezes or crackles   7. Abdomen: Soft, non-tender, Non distended bowel sounds present 8. Lower extremities: no clubbing, cyanosis, no  edema 9. Neurologically Grossly intact, moving all 4 extremities equally   10. MSK: Normal range of motion   All other LABS:     Recent Labs  Lab 06/05/19 0243 06/06/19 0405 06/06/19 1129 06/07/19 0153 06/09/19 1541  WBC 16.3* 17.7* 24.2* 16.5* 18.1*  NEUTROABS 12.5* 13.7* 19.1* 13.0* 15.3*  HGB 8.1* 7.1* 8.3* 7.3* 8.4*  HCT 24.7* 21.3* 25.2* 22.2* 26.1*  MCV 78.9* 77.5* 77.5* 77.9* 78.9*  PLT 235 209 381 218 284     Recent Labs  Lab 06/03/19 0431 06/04/19 0334 06/05/19 0243 06/06/19 0405 06/07/19 0153 06/09/19 1541  NA 137 138 139 139 139 138  K 4.0 3.9 3.8 4.4 3.4* 4.9  CL 100 102 102 104 105 105  CO2 25 25 25 23 25 25   GLUCOSE 116* 98 94 91 104* 158*  BUN 35* 34* 32* 33* 33* 39*  CREATININE 2.36* 2.35* 2.31* 2.32* 2.39* 2.86*  CALCIUM 8.4* 8.2* 8.4* 8.2* 8.2* 8.4*  MG 2.4  --   --   --   --   --      Recent Labs  Lab 06/03/19 0431 06/04/19 0334 06/05/19 0243 06/06/19 0405 06/07/19 0153  AST 92* 114* 111* 99* 82*  ALT 40 42 45* 40 36  ALKPHOS 122 132* 129* 120 110  BILITOT 1.3* 1.3* 1.5*  1.0 1.5*  PROT 6.8 6.2* 6.5 6.2* 6.0*  ALBUMIN 1.9* 1.7* 1.7* 1.8* 1.7*       Cultures:    Component Value Date/Time   SDES URINE, CLEAN CATCH 06/01/2019 1941   SPECREQUEST  06/01/2019 1941    NONE Performed at [facility], [place] [address], [place], [place] [address]    CULT >=100,000 COLONIES/mL KLEBSIELLA PNEUMONIAE (A) 06/01/2019 1941   REPTSTATUS 06/04/2019 FINAL 06/01/2019 1941     Radiological Exams on Admission: DG Chest Port 1 View  Result Date: 06/09/2019 CLINICAL DATA:  Pt unable to answer hx questions. Only moaned when Tech questioned her. Per nurse: Pt from [facility]. Reports difficulty breathing since 1100 this AM. Lethargic and SOB. 100% on RA, wheezing heard. 5 mg or albuterol given en route.  EXAM: PORTABLE CHEST 1 VIEW COMPARISON:  Chest radiograph 06/05/2019 FINDINGS: Stable cardiomediastinal contours with enlarged heart size. Left ICD in place. Central pulmonary vascular congestion. Diffuse mild bilateral interstitial opacities likely reflecting trace edema. No new focal consolidation. No pneumothorax or large pleural effusion. No acute finding in the visualized skeleton. IMPRESSION: Cardiomegaly with central pulmonary vascular congestion and mild bilateral interstitial opacities likely reflecting edema. Electronically Signed   By: Audie Pinto M.D.   On: 06/09/2019 15:59    Chart has been reviewed    Assessment/Plan   73 y.o. female with medical history significant of  CHF/NICM (EF 10 to 15%), VT s/p AICD 2014 and biventricular PPM, paroxysmal A. fib/bradycardia, chronic anemia, CKD-4, HTN, HLD, moderate protein calorie malnutrition and GIB, prolonged QT/torsade and multiple hospitalizations Admitted for acute on chronic combined systolic diastolic CHF exacerbation  Present on Admission:  . Acute on chronic combined systolic and diastolic CHF (congestive heart failure) ([place]) -  - admit on telemetry,  cycle cardiac enzymes,   Trop 31-28 obtain serial ECG  to evaluate for ischemia as a cause of heart failure  monitor daily weight: There were no vitals filed for this visit. Last BNP BNP (last 3 results) Recent Labs    05/23/19 0057 05/26/19 1822 06/09/19 1541  BNP 1,286.3* 1,587.2* 2,039.7*    ProBNP (last 3 results) No results for input(s): PROBNP in the last 8760 hours.   diurese with IV lasix and monitor orthostatics and creatinine to avoid over diuresis.  Order echogram to evaluate EF and valves  ACE/ARBi   Contraindicated due to CKD   cardiology consulted   . PAF (paroxysmal atrial fibrillation) (HCC) -           - CHA2DS2 vas score >5 : continue current anticoagulation with   Eliquis,           -  Rate control:  Currently controlled with Coreg will  continue        - Rhythm control:  Continue amiodarone  . ICD (implantable cardioverter-defibrillator), dual, st Judes - sp pacemaker  . HLD (hyperlipidemia) - stable continue home meds  . CKD (chronic kidney disease), stage III -chronic stable avoid nephrotoxic medications  . Symptomatic anemia -persistent anemia have had GI work-up in the past currently stable continue to monitor   . Hypoalbuminemia - administer IV albumin as this may help with diuresis  . Leukocytosis - chronic stable   . Elevated troponin - chronically elevated stable  . Hypertension - chronic stable   Other plan as per orders.  DVT prophylaxis:  eliquis   Code Status: Partial code patient does not wish to be intubated as per patient  I had personally  discussed CODE STATUS with patient   Family Communication:   Family not at  Bedside    Disposition Plan:                            Back to current facility when stable                                             Would benefit from PT/OT eval prior to DC  Ordered                                     Social Work  consulted                   Nutrition    consulted                                   Palliative care    consulted                                       Consults called: Cardiology aware  Admission status:  ED Disposition    ED Disposition Condition [place]: [place] [[id number]]  Level of Care: Telemetry Cardiac [103]  Covid Evaluation: Symptomatic Person Under Investigation (PUI)  Diagnosis: CHF exacerbation [facility]) [[id number]]  Admitting Physician: Toy Baker [[id number]]  Attending Physician: Toy Baker [[id number]]  Estimated length of stay: 3 - 4 days  Certification:: I certify this patient will need inpatient services for at least 2 midnights          inpatient     Expect 2 midnight stay secondary to severity of patient's current illness including   hemodynamic instability despite  optimal treatment ( hypoxia )  Severe lab/radiological/exam abnormalities including:  Anemia, elevated troponin   and extensive comorbidities including:   CHF .   CKD  . Chronic anticoagulation  That are currently affecting medical management.   I expect  patient to be hospitalized for 2 midnights requiring inpatient medical care.  Patient is at high risk for adverse outcome (such as loss of life or disability) if not treated.  Indication for inpatient stay as follows:    Hemodynamic instability despite maximal medical therapy,      New or worsening hypoxia  Need for IV diuresis    Level of care     tele  For   24H      Precautions: admitted as  PUI  Airborne and Contact precautions  If Covid PCR is negative  - please DC precautions    PPE: Used by the provider:   P100  eye Goggles,  Gloves    Nethaniel Mattie 06/10/2019, 12:24 AM    [facility]     after 2 AM please page floor coverage PA If 7AM-7PM, please contact the day team taking care of the patient using [email or web address]

## 2019-06-09 NOTE — ED Triage Notes (Signed)
Pt from [facility]. Reports difficulty breathing since 1100 this AM. Lethargic and SOB. 100% on RA, wheezing heard. 5 mg or albuterol given en route.

## 2019-06-09 NOTE — ED Notes (Signed)
Tele daughter- Magda Paganini would like an update as soon as possible at [phone]

## 2019-06-09 NOTE — ED Provider Notes (Signed)
[facility] EMERGENCY DEPARTMENT Provider Note   CSN: [id number] Arrival date & time: 06/09/19  1451     History Chief Complaint  Patient presents with  . Shortness of Breath    Stephanie Yates is a 73 y.o. female.  Patient is a 73 year old female with a history of CHF, chronic kidney disease, hypertension, hyperlipidemia and paroxysmal atrial fibrillation on Eliquis who presents with shortness of breath.  She states she has been short of breath since last night.  She has a cough which she says is productive of some white sputum.  She has some chest pain in the center of her chest which is been fairly constant.  She has little bit of increased swelling per her report.  No known fevers.  No vomiting.  She had some wheezing in route and was given 5 mg of albuterol by EMS.  She lives at [place].        Past Medical History:  Diagnosis Date  . Chronic anemia   . Chronic systolic CHF (congestive heart failure) ([place])   . CKD (chronic kidney disease), stage IV ([place])   . Former tobacco use   . History of tobacco abuse   . Hyperlipidemia   . Hypertension   . Mitral regurgitation   . Nonischemic cardiomyopathy ([place])    a. EF 10-15% on 11/2012 cath with no significant CAD. b. EF 25% in 2019. b. EF 15% in 12/2018  . PAF (paroxysmal atrial fibrillation) ([place])   . Protein calorie malnutrition ([place])   . Quit consuming alcohol in remote past   . Sinus bradycardia 12/21/2012  . Ventricular tachycardia ([place]) 12/14/2012    Patient Active Problem List   Diagnosis Date Noted  . Goals of care, counseling/discussion   . Pressure injury of skin 05/29/2019  . Acute on chronic systolic CHF (congestive heart failure) ([place]) 05/27/2019  . Torsades de pointes ([place]) 05/14/2019  . Weakness generalized   . Palliative care by specialist   . Gallbladder mass 05/06/2019  . Volume overload 05/06/2019  . Vertigo 04/15/2019  . Abnormal liver function 04/15/2019  .  Right adrenal mass ([place]) possible adenoma 03/18/2019  . Nephrolithiasis 03/18/2019  . Prolonged QT interval 03/17/2019  . Renal failure (ARF), acute on chronic ([place]) 03/17/2019  . Nausea and vomiting 03/17/2019  . Epigastric abdominal pain 01/29/2019  . Gastritis 01/28/2019  . CKD (chronic kidney disease) stage 4, GFR 15-29 ml/min (HCC) 01/28/2019  . NSVT (nonsustained ventricular tachycardia) ([place]) 01/17/2019  . AKI (acute kidney injury) ([place]) 01/12/2019  . DNR (do not resuscitate) discussion   . CHF exacerbation ([place]) 01/06/2019  . Influenza A 09/02/2018  . Lactic acidosis 09/02/2018  . Chronic combined systolic and diastolic congestive heart failure ([place]) 09/02/2018  . Rapid atrial fibrillation ([place]) 06/08/2018  . Acute on chronic combined systolic and diastolic heart failure ([place]) 06/07/2018  . Anemia 06/07/2018  . Acute GI bleeding 06/15/2017  . Chronic anticoagulation   . Acute on chronic congestive heart failure ([place])   . HLD (hyperlipidemia) 03/05/2017  . CKD (chronic kidney disease), stage III 03/05/2017  . Chest pain 03/05/2017  . Symptomatic anemia 03/05/2017  . PAF (paroxysmal atrial fibrillation) ([place]) 04/01/2013  . ICD (implantable cardioverter-defibrillator), dual, st Judes 04/01/2013  . PVC's (premature ventricular contractions) 04/01/2013  . Hypokalemia 12/21/2012  . History of tobacco abuse 12/21/2012  . Transaminitis 12/21/2012  . Sinus bradycardia 12/21/2012  . Hyperkalemia 12/20/2012  . Nonischemic cardiomyopathy ([place]) 12/20/2012  . UTI (urinary  tract infection) 12/20/2012  . Acute on chronic combined systolic and diastolic CHF (congestive heart failure) ([place]) 12/20/2012  . Ventricular tachycardia ([place]) 12/14/2012  . Hypertension 12/14/2012    Past Surgical History:  Procedure Laterality Date  . BIOPSY  01/22/2019   Procedure: BIOPSY;  Surgeon: Wilford Corner, MD;  Location: [facility];  Service: Endoscopy;;  . COLONOSCOPY WITH PROPOFOL N/A  03/19/2019   Procedure: COLONOSCOPY WITH PROPOFOL;  Surgeon: Otis Brace, MD;  Location: [facility];  Service: Gastroenterology;  Laterality: N/A;  . ESOPHAGOGASTRODUODENOSCOPY (EGD) WITH PROPOFOL N/A 06/18/2017   Procedure: ESOPHAGOGASTRODUODENOSCOPY (EGD) WITH PROPOFOL;  Surgeon: Ronnette Juniper, MD;  Location: [facility];  Service: Gastroenterology;  Laterality: N/A;  . ESOPHAGOGASTRODUODENOSCOPY (EGD) WITH PROPOFOL N/A 01/22/2019   Procedure: ESOPHAGOGASTRODUODENOSCOPY (EGD) WITH PROPOFOL;  Surgeon: Wilford Corner, MD;  Location: [facility];  Service: Endoscopy;  Laterality: N/A;  . ESOPHAGOGASTRODUODENOSCOPY (EGD) WITH PROPOFOL N/A 03/19/2019   Procedure: ESOPHAGOGASTRODUODENOSCOPY (EGD) WITH PROPOFOL;  Surgeon: Otis Brace, MD;  Location: [facility] ENDOSCOPY;  Service: Gastroenterology;  Laterality: N/A;  . IMPLANTABLE CARDIOVERTER DEFIBRILLATOR IMPLANT  12/19/2012   St. Jude dual-chamber ICD, serial number [id number]  . IMPLANTABLE CARDIOVERTER DEFIBRILLATOR IMPLANT N/A 12/19/2012   Procedure: IMPLANTABLE CARDIOVERTER DEFIBRILLATOR IMPLANT;  Surgeon: Deboraha Sprang, MD;  Location: [facility] CATH LAB;  Service: Cardiovascular;  Laterality: N/A;  . LEFT AND RIGHT HEART CATHETERIZATION WITH CORONARY ANGIOGRAM N/A 12/17/2012   Procedure: LEFT AND RIGHT HEART CATHETERIZATION WITH CORONARY ANGIOGRAM;  Surgeon: Sherren Mocha, MD;  Location: [facility] CATH LAB;  Service: Cardiovascular;  Laterality: N/A;  . POLYPECTOMY  03/19/2019   Procedure: POLYPECTOMY;  Surgeon: Otis Brace, MD;  Location: [facility] ENDOSCOPY;  Service: Gastroenterology;;  . RIGHT HEART CATH N/A 05/30/2019   Procedure: RIGHT HEART CATH;  Surgeon: Jolaine Artist, MD;  Location: [facility] CV LAB;  Service: Cardiovascular;  Laterality: N/A;  . SCLEROTHERAPY  01/22/2019   Procedure: SCLEROTHERAPY;  Surgeon: Wilford Corner, MD;  Location: [facility] ENDOSCOPY;  Service: Endoscopy;;     OB History   No obstetric history on file.     Family  History  Problem Relation Age of Onset  . Cancer Mother     Social History   Tobacco Use  . Smoking status: Former Research scientist (life sciences)  . Smokeless tobacco: Never Used  Substance Use Topics  . Alcohol use: No  . Drug use: No    Home Medications Prior to Admission medications   Medication Sig Start Date End Date Taking? Authorizing Provider  acetaminophen (TYLENOL) 500 MG tablet Take 1,000 mg by mouth every 6 (six) hours as needed (pain).    [provider]  albuterol (VENTOLIN HFA) 108 (90 Base) MCG/ACT inhaler Inhale 2 puffs into the lungs every 4 (four) hours as needed for wheezing or shortness of breath.    [provider]  amiodarone (PACERONE) 200 MG tablet Take 1 tablet (200 mg total) by mouth 2 (two) times daily. 05/14/19 06/13/19  Guilford Shi, MD  apixaban (ELIQUIS) 2.5 MG TABS tablet Take 1 tablet (2.5 mg total) by mouth 2 (two) times daily. 03/19/19   Debbe Odea, MD  atorvastatin (LIPITOR) 40 MG tablet Take 1 tablet (40 mg total) by mouth daily at 6 PM. 07/05/18   Deboraha Sprang, MD  carvedilol (COREG) 3.125 MG tablet Take 1 tablet (3.125 mg total) by mouth 2 (two) times daily with a meal. 09/12/18   Deboraha Sprang, MD  Cholecalciferol (VITAMIN D-3) 25 MCG (1000 UT) CAPS Take 1,000 Units by mouth at bedtime.  [provider]  Ensure (ENSURE) Take 237 mLs by mouth daily.    [provider]  ferrous gluconate (FERGON) 324 MG tablet Take 324 mg by mouth 2 (two) times daily.  07/05/18   [provider]  furosemide (LASIX) 80 MG tablet Take 0.5 tablets (40 mg total) by mouth daily. 04/19/19   Nita Sells, MD  lactulose (CEPHULAC) 20 g packet Take 1 packet (20 g total) by mouth 2 (two) times daily. Hold for 3 or more than 3 bowel movements per day. 06/07/19 07/07/19  Darliss Cheney, MD  magnesium oxide (MAGNESIUM-OXIDE) 400 (241.3 Mg) MG tablet Take 1 tablet (400 mg total) by mouth daily. 05/14/19   Guilford Shi, MD  mexiletine  (MEXITIL) 200 MG capsule Take 1 capsule (200 mg total) by mouth 2 (two) times daily. 01/24/19   [name], PA-C  nitroGLYCERIN (NITROSTAT) 0.4 MG SL tablet Place 1 tablet (0.4 mg total) under the tongue every 5 (five) minutes as needed for chest pain. 01/12/19   Guilford Shi, MD  ondansetron (ZOFRAN ODT) 4 MG disintegrating tablet Take 1 tablet (4 mg total) by mouth every 8 (eight) hours as needed for nausea or vomiting. 03/21/19   [place] Donnamarie Poag, DO  pantoprazole (PROTONIX) 40 MG tablet Take 1 tablet (40 mg total) by mouth daily. 04/05/19 07/04/19  [place] Donnamarie Poag, DO  polyethylene glycol (MIRALAX / GLYCOLAX) 17 g packet Take 17 g by mouth daily. Can take up to twice a day for constipation. Patient not taking: Reported on 05/27/2019 03/19/19   Debbe Odea, MD  senna-docusate (SENOKOT-S) 8.6-50 MG tablet Take 1 tablet by mouth at bedtime as needed for mild constipation. 05/10/19   Charolotte Capuchin, MD  sucralfate (CARAFATE) 1 GM/10ML suspension Take 10 mLs (1 g total) by mouth 4 (four) times daily -  with meals and at bedtime. 03/19/19   Debbe Odea, MD    Allergies    Strawberry extract  Review of Systems   Review of Systems  Constitutional: Positive for fatigue. Negative for chills, diaphoresis and fever.  HENT: Negative for congestion, rhinorrhea and sneezing.   Eyes: Negative.   Respiratory: Positive for cough and shortness of breath. Negative for chest tightness.   Cardiovascular: Positive for chest pain and leg swelling.  Gastrointestinal: Negative for abdominal pain, blood in stool, diarrhea, nausea and vomiting.  Genitourinary: Negative for difficulty urinating, flank pain, frequency and hematuria.  Musculoskeletal: Negative for arthralgias and back pain.  Skin: Negative for rash.  Neurological: Negative for dizziness, speech difficulty, weakness, numbness and headaches.    Physical Exam Updated Vital Signs BP (!) 114/50   Pulse (!) 59   Temp (!) 97.5 F (36.4 C)  (Oral)   Resp 15   SpO2 100%   Physical Exam Constitutional:      Appearance: She is well-developed.  HENT:     Head: Normocephalic and atraumatic.  Eyes:     Pupils: Pupils are equal, round, and reactive to light.  Cardiovascular:     Rate and Rhythm: Normal rate and regular rhythm.     Heart sounds: Normal heart sounds.  Pulmonary:     Effort: Tachypnea and accessory muscle usage present. No respiratory distress.     Breath sounds: Rales present. No wheezing.  Chest:     Chest wall: No tenderness.  Abdominal:     General: Bowel sounds are normal.     Palpations: Abdomen is soft.     Tenderness: There is no abdominal tenderness. There is  no guarding or rebound.  Musculoskeletal:        General: Normal range of motion.     Cervical back: Normal range of motion and neck supple.     Comments: Trace edema to lower extremities bilaterally  Lymphadenopathy:     Cervical: No cervical adenopathy.  Skin:    General: Skin is warm and dry.     Findings: No rash.  Neurological:     Mental Status: She is alert and oriented to person, place, and time.     ED Results / Procedures / Treatments   Labs (all labs ordered are listed, but only abnormal results are displayed) Labs Reviewed  BASIC METABOLIC PANEL - Abnormal; Notable for the following components:      Result Value   Glucose, Bld 158 (*)    BUN 39 (*)    Creatinine, Ser 2.86 (*)    Calcium 8.4 (*)    GFR calc non Af Amer 15 (*)    GFR calc Af Amer 18 (*)    All other components within normal limits  CBC WITH DIFFERENTIAL/PLATELET - Abnormal; Notable for the following components:   WBC 18.1 (*)    RBC 3.31 (*)    Hemoglobin 8.4 (*)    HCT 26.1 (*)    MCV 78.9 (*)    MCH 25.4 (*)    RDW 16.2 (*)    Neutro Abs 15.3 (*)    Monocytes Absolute 1.5 (*)    Abs Immature Granulocytes 0.11 (*)    All other components within normal limits  BRAIN NATRIURETIC PEPTIDE - Abnormal; Notable for the following components:   B  Natriuretic Peptide 2,039.7 (*)    All other components within normal limits  TROPONIN I (HIGH SENSITIVITY) - Abnormal; Notable for the following components:   Troponin I (High Sensitivity) 31 (*)    All other components within normal limits  TROPONIN I (HIGH SENSITIVITY) - Abnormal; Notable for the following components:   Troponin I (High Sensitivity) 28 (*)    All other components within normal limits  SARS CORONAVIRUS 2 (TAT 6-24 HRS)  AMMONIA  POC SARS CORONAVIRUS 2 AG -  ED    EKG EKG Interpretation  Date/Time:  Sunday June 09 2019 15:02:53 EST Ventricular Rate:  69 PR Interval:    QRS Duration: 147 QT Interval:  443 QTC Calculation: 492 R Axis:   2 Text Interpretation: A-V dual-paced complexes w/ some inhibition No further analysis attempted due to paced rhythm PR intermal prolonged Confirmed by Malvin Johns [phone]) on 06/09/2019 3:10:25 PM   Radiology DG Chest Port 1 View  Result Date: 06/09/2019 CLINICAL DATA:  Pt unable to answer hx questions. Only moaned when Tech questioned her. Per nurse: Pt from [facility]. Reports difficulty breathing since 1100 this AM. Lethargic and SOB. 100% on RA, wheezing heard. 5 mg or albuterol given en route. EXAM: PORTABLE CHEST 1 VIEW COMPARISON:  Chest radiograph 06/05/2019 FINDINGS: Stable cardiomediastinal contours with enlarged heart size. Left ICD in place. Central pulmonary vascular congestion. Diffuse mild bilateral interstitial opacities likely reflecting trace edema. No new focal consolidation. No pneumothorax or large pleural effusion. No acute finding in the visualized skeleton. IMPRESSION: Cardiomegaly with central pulmonary vascular congestion and mild bilateral interstitial opacities likely reflecting edema. Electronically Signed   By: Audie Pinto M.D.   On: 06/09/2019 15:59    Procedures Procedures (including critical care time)  Medications Ordered in ED Medications  furosemide (LASIX) injection 80 mg (80  mg Intravenous  Given 06/09/19 1638)    ED Course  I have reviewed the triage vital signs and the nursing notes.  Pertinent labs & imaging results that were available during my care of the patient were reviewed by me and considered in my medical decision making (see chart for details).    MDM Rules/Calculators/A&P     CHA2DS2/VAS Stroke Risk Points  Current as of a minute ago     5 >= 2 Points: High Risk  1 - 1.99 Points: Medium Risk  0 Points: Low Risk    The patient's score has not changed in the past year.: No Change     Details    This score determines the patient's risk of having a stroke if the  patient has atrial fibrillation.       Points Metrics  1 Has Congestive Heart Failure:  Yes    Current as of a minute ago  0 Has Vascular Disease:  No    Current as of a minute ago  1 Has Hypertension:  Yes    Current as of a minute ago  2 Age:  20    Current as of a minute ago  0 Has Diabetes:  No    Current as of a minute ago  0 Had Stroke:  No  Had TIA:  No  Had thromboembolism:  No    Current as of a minute ago  1 Female:  Yes    Current as of a minute ago                         Patient is a 73 year old female who presents with shortness of breath.  She has been tachypneic with some mild accessory muscle use.  She has crackles on exam.  X-ray shows pulmonary edema with a markedly elevated BNP at 2000.  She has mild worsening chronic kidney disease and stable anemia.  I initially gave her 80 of Lasix.  She feels a little bit better after this and has less increased work of breathing.  I spoke with Dr. Charissa Bash with cardiology who feels a hospital admission would be appropriate by the hospitalist service.  I spoke with Dr. Roel Cluck with the hospitalist service to admit the patient for further treatment. Final Clinical Impression(s) / ED Diagnoses Final diagnoses:  Acute on chronic congestive heart failure, unspecified heart failure type [facility])    Rx / DC Orders ED  Discharge Orders    None       Malvin Johns, MD 06/09/19 [phone]

## 2019-06-10 ENCOUNTER — Other Ambulatory Visit (HOSPITAL_COMMUNITY): Payer: Medicare Other

## 2019-06-10 DIAGNOSIS — R627 Adult failure to thrive: Secondary | ICD-10-CM

## 2019-06-10 DIAGNOSIS — N184 Chronic kidney disease, stage 4 (severe): Secondary | ICD-10-CM

## 2019-06-10 DIAGNOSIS — Z66 Do not resuscitate: Secondary | ICD-10-CM

## 2019-06-10 DIAGNOSIS — I509 Heart failure, unspecified: Secondary | ICD-10-CM

## 2019-06-10 DIAGNOSIS — I5043 Acute on chronic combined systolic (congestive) and diastolic (congestive) heart failure: Secondary | ICD-10-CM

## 2019-06-10 DIAGNOSIS — Z515 Encounter for palliative care: Secondary | ICD-10-CM

## 2019-06-10 LAB — CBC
HCT: 22.1 % — ABNORMAL LOW (ref 36.0–46.0)
Hemoglobin: 7.2 g/dL — ABNORMAL LOW (ref 12.0–15.0)
MCH: 25.8 pg — ABNORMAL LOW (ref 26.0–34.0)
MCHC: 32.6 g/dL (ref 30.0–36.0)
MCV: 79.2 fL — ABNORMAL LOW (ref 80.0–100.0)
Platelets: 214 10*3/uL (ref 150–400)
RBC: 2.79 MIL/uL — ABNORMAL LOW (ref 3.87–5.11)
RDW: 16.2 % — ABNORMAL HIGH (ref 11.5–15.5)
WBC: 13.5 10*3/uL — ABNORMAL HIGH (ref 4.0–10.5)
nRBC: 0 % (ref 0.0–0.2)

## 2019-06-10 LAB — COMPREHENSIVE METABOLIC PANEL
ALT: 35 U/L (ref 0–44)
AST: 76 U/L — ABNORMAL HIGH (ref 15–41)
Albumin: 1.9 g/dL — ABNORMAL LOW (ref 3.5–5.0)
Alkaline Phosphatase: 104 U/L (ref 38–126)
Anion gap: 11 (ref 5–15)
BUN: 39 mg/dL — ABNORMAL HIGH (ref 8–23)
CO2: 24 mmol/L (ref 22–32)
Calcium: 8.1 mg/dL — ABNORMAL LOW (ref 8.9–10.3)
Chloride: 106 mmol/L (ref 98–111)
Creatinine, Ser: 2.52 mg/dL — ABNORMAL HIGH (ref 0.44–1.00)
GFR calc Af Amer: 21 mL/min — ABNORMAL LOW (ref >60)
GFR calc non Af Amer: 18 mL/min — ABNORMAL LOW (ref >60)
Glucose, Bld: 101 mg/dL — ABNORMAL HIGH (ref 70–99)
Potassium: 4.2 mmol/L (ref 3.5–5.1)
Sodium: 141 mmol/L (ref 135–145)
Total Bilirubin: 1.4 mg/dL — ABNORMAL HIGH (ref 0.3–1.2)
Total Protein: 6.2 g/dL — ABNORMAL LOW (ref 6.5–8.1)

## 2019-06-10 LAB — RETICULOCYTES
Immature Retic Fract: 20 % — ABNORMAL HIGH (ref 2.3–15.9)
RBC.: 2.79 MIL/uL — ABNORMAL LOW (ref 3.87–5.11)
Retic Count, Absolute: 50.8 10*3/uL (ref 19.0–186.0)
Retic Ct Pct: 1.8 % (ref 0.4–3.1)

## 2019-06-10 LAB — PHOSPHORUS: Phosphorus: 4 mg/dL (ref 2.5–4.6)

## 2019-06-10 LAB — IRON AND TIBC
Iron: 18 ug/dL — ABNORMAL LOW (ref 28–170)
Saturation Ratios: 8 % — ABNORMAL LOW (ref 10.4–31.8)
TIBC: 223 ug/dL — ABNORMAL LOW (ref 250–450)
UIBC: 205 ug/dL

## 2019-06-10 LAB — MAGNESIUM: Magnesium: 2 mg/dL (ref 1.7–2.4)

## 2019-06-10 LAB — VITAMIN B12: Vitamin B-12: 1023 pg/mL — ABNORMAL HIGH (ref 180–914)

## 2019-06-10 LAB — FOLATE: Folate: 15.2 ng/mL (ref >5.9)

## 2019-06-10 LAB — FERRITIN: Ferritin: 26 ng/mL (ref 11–307)

## 2019-06-10 LAB — TSH: TSH: 1.957 u[IU]/mL (ref 0.350–4.500)

## 2019-06-10 MED ORDER — SUCRALFATE 1 GM/10ML PO SUSP
1.0000 g | Freq: Three times a day (TID) | ORAL | Status: DC
  Administered 2019-06-10 – 2019-06-17 (×28): 1 g via ORAL
  Filled 2019-06-10 (×32): qty 10

## 2019-06-10 MED ORDER — ATORVASTATIN CALCIUM 40 MG PO TABS
40.0000 mg | ORAL_TABLET | Freq: Every day | ORAL | Status: DC
  Administered 2019-06-10 – 2019-06-16 (×7): 40 mg via ORAL
  Filled 2019-06-10 (×8): qty 1

## 2019-06-10 MED ORDER — PANTOPRAZOLE SODIUM 40 MG PO TBEC
40.0000 mg | DELAYED_RELEASE_TABLET | Freq: Every day | ORAL | Status: DC
  Administered 2019-06-10 – 2019-06-17 (×8): 40 mg via ORAL
  Filled 2019-06-10 (×8): qty 1

## 2019-06-10 MED ORDER — AMIODARONE HCL 200 MG PO TABS
200.0000 mg | ORAL_TABLET | Freq: Two times a day (BID) | ORAL | Status: DC
  Administered 2019-06-10 – 2019-06-17 (×16): 200 mg via ORAL
  Filled 2019-06-10 (×16): qty 1

## 2019-06-10 MED ORDER — SODIUM CHLORIDE 0.9 % IV SOLN: 250.0000 mL | INTRAVENOUS | Status: DC | PRN

## 2019-06-10 MED ORDER — LACTULOSE 10 GM/15ML PO SOLN
20.0000 g | Freq: Two times a day (BID) | ORAL | Status: DC
  Administered 2019-06-10 – 2019-06-17 (×10): 20 g via ORAL
  Filled 2019-06-10 (×13): qty 30

## 2019-06-10 MED ORDER — ACETAMINOPHEN 325 MG PO TABS
650.0000 mg | ORAL_TABLET | Freq: Four times a day (QID) | ORAL | Status: DC | PRN
  Administered 2019-06-12: 650 mg via ORAL
  Filled 2019-06-10: qty 2

## 2019-06-10 MED ORDER — MEXILETINE HCL 200 MG PO CAPS
200.0000 mg | ORAL_CAPSULE | Freq: Two times a day (BID) | ORAL | Status: DC
  Administered 2019-06-10 – 2019-06-17 (×15): 200 mg via ORAL
  Filled 2019-06-10 (×18): qty 1

## 2019-06-10 MED ORDER — CARVEDILOL 3.125 MG PO TABS
3.1250 mg | ORAL_TABLET | Freq: Two times a day (BID) | ORAL | Status: DC
  Administered 2019-06-10 – 2019-06-16 (×14): 3.125 mg via ORAL
  Filled 2019-06-10 (×17): qty 1

## 2019-06-10 MED ORDER — HYDROCODONE-ACETAMINOPHEN 5-325 MG PO TABS: 1.0000 | ORAL_TABLET | ORAL | Status: DC | PRN

## 2019-06-10 MED ORDER — FERROUS GLUCONATE 324 (38 FE) MG PO TABS
324.0000 mg | ORAL_TABLET | Freq: Two times a day (BID) | ORAL | Status: DC
  Administered 2019-06-10 – 2019-06-17 (×15): 324 mg via ORAL
  Filled 2019-06-10 (×18): qty 1

## 2019-06-10 MED ORDER — ONDANSETRON HCL 4 MG/2ML IJ SOLN
4.0000 mg | Freq: Four times a day (QID) | INTRAMUSCULAR | Status: DC | PRN
  Administered 2019-06-10 – 2019-06-17 (×8): 4 mg via INTRAVENOUS
  Filled 2019-06-10 (×8): qty 2

## 2019-06-10 MED ORDER — APIXABAN 2.5 MG PO TABS
2.5000 mg | ORAL_TABLET | Freq: Two times a day (BID) | ORAL | Status: DC
  Administered 2019-06-10 – 2019-06-17 (×16): 2.5 mg via ORAL
  Filled 2019-06-10 (×17): qty 1

## 2019-06-10 MED ORDER — ENSURE ENLIVE PO LIQD
237.0000 mL | ORAL | Status: DC
  Administered 2019-06-12 – 2019-06-17 (×5): 237 mL via ORAL

## 2019-06-10 MED ORDER — ALBUTEROL SULFATE HFA 108 (90 BASE) MCG/ACT IN AERS
2.0000 | INHALATION_SPRAY | RESPIRATORY_TRACT | Status: DC | PRN
  Filled 2019-06-10: qty 6.7

## 2019-06-10 MED ORDER — ONDANSETRON HCL 4 MG PO TABS
4.0000 mg | ORAL_TABLET | Freq: Four times a day (QID) | ORAL | Status: DC | PRN
  Administered 2019-06-12 – 2019-06-16 (×5): 4 mg via ORAL
  Filled 2019-06-10 (×5): qty 1

## 2019-06-10 MED ORDER — ACETAMINOPHEN 650 MG RE SUPP: 650.0000 mg | Freq: Four times a day (QID) | RECTAL | Status: DC | PRN

## 2019-06-10 MED ORDER — SODIUM CHLORIDE 0.9% FLUSH: 3.0000 mL | INTRAVENOUS | Status: DC | PRN

## 2019-06-10 MED ORDER — ALBUTEROL SULFATE (2.5 MG/3ML) 0.083% IN NEBU: 2.5000 mg | INHALATION_SOLUTION | RESPIRATORY_TRACT | Status: DC | PRN

## 2019-06-10 MED ORDER — SODIUM CHLORIDE 0.9% FLUSH
3.0000 mL | Freq: Two times a day (BID) | INTRAVENOUS | Status: DC
  Administered 2019-06-10 – 2019-06-17 (×16): 3 mL via INTRAVENOUS

## 2019-06-10 NOTE — Consult Note (Signed)
Consultation Note Date: 06/10/2019   Patient Name: Stephanie Yates  DOB: [birth year]  MRN: [id number]  Age / Sex: 73 y.o., female   PCP: Josetta Huddle, MD Referring Physician: Tomma Rakers*  Reason for Consultation: Establishing goals of care  Palliative Care Assessment and Plan Summary of Established Goals of Care and Medical Treatment Preferences   Palliative Care Discussion Held Today:   Stephanie Yates is a 73 y.o. female with medical history significant of CHF/NICM (EF 10 to 15%), VT s/p AICD 2012-09-18 and biventricular PPM, paroxysmal A. fib/bradycardia, chronic anemia, CKD-4, HTN, HLD, moderate protein calorie malnutrition and GIB, prolonged QT/torsade and multiple hospitalizations.  She is known patient to the palliative care service, last seen on 06/07/2019. Has had eight readmissions in the last six months.   This NP Tacey Ruiz reviewed medical records, received report from team, assessed the patient and then meet at the patient's bedside along with her grandson Gennaro Africa.  Obtained collateral information from patients champion, Stephanie Yates  to discuss diagnosis prognosis, GOC, EOL wishes disposition and options. Per Stephanie Yates since discharge to [facility] only two days ago she has done poorly thought to be related to being on the isolation hall. Patient known yo have claustrophobia, does not like to be "locked in".  At the SNF patient had door shut and was unable to get care when requested. Per Stephanie Yates everything went "haywire" she was sedated and unable to see and speak with family.   Prior to most recent hospital stay patient was living at home independently. A nurse would come 2 days weekly as well as physical therapy. Up until  February 2020 she was working in [employer], which brought her great joy. Unfortunately, not able to work over the past few months due to the pandemic.  Over the past few years the patient has suffered great loss. Oldest  daughter died 83. Youngest son died in  Sep 18, 2016 and seven days later her eldest son died. These events have greatly affected the patients demeanor.   A detailed discussion was had today regarding advanced directives.  Concepts specific to code status, artifical feeding and hydration, continued IV antibiotics and rehospitalization was had.  The difference between a aggressive medical intervention path  and a palliative comfort care path for this patient at this time was had.  Values and goals of care important to patient and family were attempted to be elicited.  Stephanie Yates stated that her mother would not want heroic measures, no chest compression and no intubation per their prior conversations. She would want to ideally be at home. At the present daughter was certain of no chest compressions and no intubation. The idea of ICU transfers, pressors, antiarrythmic, AICD (active), and feeding tube is still being explored.   If an origin of abdominal pain can be identified to help management, would be interested in that.  Concept of Hospice and Palliative Care were discussed. Prior hospice had been discussed though the patient did not like thought of it. Stephanie Yates and I discussed at the very least utilizing Palliative care outpatient to check in regularly with the patient which she is amenable to.   Family encouraged to call with questions or concerns.  PMT will continue to support holistically.  Contacts/Participants in Discussion: Primary Decision Maker: Stephanie Yates  Goals of Care/Code Status/Advance Care Planning:   Code Status: DNAR/DNI  Artificial feeding: Needs further discussion  Antibiotics: Yes  Diagnostics: Yes  Rehospitalization: At the present time, YES  Symptom  Management:   Bowel regiment:  Would hold off on bowel regiment given patients persistent loose stools  If worsens would consider ordering C.Diff and stool panel  Abdominal Pain, recently had CT abd/pelvis, KUB, [place], CI  consult origin is unknown:  Tylenol 650mg  PO Q6H PRN  Norco 1-2 tabs PO Q4H  Dyspnea:  Supplemental O2  Duonebs Q4H  Lasix per primary team  Nausea:  Zofran Q6H PRN  Psycho-social/Spiritual:   Support System: One daughter, Stephanie Yates. Three grandsons, most involved grandson is Wellsite geologist  Desire for further Chaplaincy support: Yes  Prognosis: < 6 months  Discharge Planning:  Home with [place] and Palliative Care outpatient Patients grandson, Stephanie Yates plans to move in with her/ She will have 24/7 support     Chief Complaint: Shortness of breath  History of Present Illness:   Stephanie Yates is a 73 y.o. female with medical history significant of CHF/NICM (EF 10 to 15%), VT s/p AICD 2014 and biventricular PPM, paroxysmal A. fib/bradycardia, chronic anemia, CKD-4, HTN, HLD, moderate protein calorie malnutrition and GIB, prolonged QT/torsade and multiple hospitalizations  Primary Diagnoses  Present on Admission: . Hypertension . Acute on chronic combined systolic and diastolic CHF (congestive heart failure) ([place]) . PAF (paroxysmal atrial fibrillation) ([place]) . ICD (implantable cardioverter-defibrillator), dual, [place] . HLD (hyperlipidemia) . CKD (chronic kidney disease), stage III . Symptomatic anemia . Acute on chronic combined systolic and diastolic heart failure ([place]) . Hypoalbuminemia . Leukocytosis . Elevated troponin  Palliative Review of Systems:    (+) Abdominal pain, all others are negative  I have reviewed the medical record, interviewed the patient and family, and examined the patient. The following aspects are pertinent.  Past Medical History:  Diagnosis Date  . Chronic anemia   . Chronic systolic CHF (congestive heart failure) ([place])   . CKD (chronic kidney disease), stage IV ([place])   . Former tobacco use   . History of tobacco abuse   . Hyperlipidemia   . Hypertension   . Mitral regurgitation   . Nonischemic cardiomyopathy ([place])    a. EF 10-15% on  11/2012 cath with no significant CAD. b. EF 25% in 2019. b. EF 15% in 12/2018  . PAF (paroxysmal atrial fibrillation) ([place])   . Protein calorie malnutrition ([place])   . Quit consuming alcohol in remote past   . Sinus bradycardia 12/21/2012  . Ventricular tachycardia ([place]) 12/14/2012   Social History   Socioeconomic History  . Marital status: Widowed    Spouse name: Not on file  . Number of children: Not on file  . Years of education: Not on file  . Highest education level: Not on file  Occupational History  . Not on file  Tobacco Use  . Smoking status: Former Research scientist (life sciences)  . Smokeless tobacco: Never Used  Substance and Sexual Activity  . Alcohol use: No  . Drug use: No  . Sexual activity: Not Currently  Other Topics Concern  . Not on file  Social History Narrative  . Not on file   Social Determinants of Health   Financial Resource Strain:   . Difficulty of Paying Living Expenses: Not on file  Food Insecurity:   . Worried About Charity fundraiser in the Last Year: Not on file  . Ran Out of Food in the Last Year: Not on file  Transportation Needs:   . Lack of Transportation (Medical): Not on file  . Lack of Transportation (Non-Medical): Not on file  Physical Activity:   .  Days of Exercise per Week: Not on file  . Minutes of Exercise per Session: Not on file  Stress:   . Feeling of Stress : Not on file  Social Connections:   . Frequency of Communication with Friends and Family: Not on file  . Frequency of Social Gatherings with Friends and Family: Not on file  . Attends Religious Services: Not on file  . Active Member of Clubs or Organizations: Not on file  . Attends Archivist Meetings: Not on file  . Marital Status: Not on file   Family History  Problem Relation Age of Onset  . Cancer Mother    Scheduled Meds: . amiodarone  200 mg Oral BID  . apixaban  2.5 mg Oral BID  . atorvastatin  40 mg Oral q1800  . carvedilol  3.125 mg Oral BID WC  . ferrous gluconate   324 mg Oral BID  . furosemide  80 mg Intravenous Q12H  . lactulose  20 g Oral BID  . mexiletine  200 mg Oral BID  . pantoprazole  40 mg Oral Daily  . sodium chloride flush  3 mL Intravenous Q12H  . sucralfate  1 g Oral TID WC & HS   Continuous Infusions: . sodium chloride     PRN Meds:.sodium chloride, acetaminophen **OR** acetaminophen, albuterol, HYDROcodone-acetaminophen, ondansetron **OR** ondansetron (ZOFRAN) IV, sodium chloride flush Medications Prior to Admission:  Prior to Admission medications   Medication Sig Start Date End Date Taking? Authorizing Provider  acetaminophen (TYLENOL) 500 MG tablet Take 1,000 mg by mouth every 6 (six) hours as needed (pain).    [provider]  albuterol (VENTOLIN HFA) 108 (90 Base) MCG/ACT inhaler Inhale 2 puffs into the lungs every 4 (four) hours as needed for wheezing or shortness of breath.    [provider]  amiodarone (PACERONE) 200 MG tablet Take 1 tablet (200 mg total) by mouth 2 (two) times daily. 05/14/19 06/13/19  Guilford Shi, MD  apixaban (ELIQUIS) 2.5 MG TABS tablet Take 1 tablet (2.5 mg total) by mouth 2 (two) times daily. 03/19/19   Debbe Odea, MD  atorvastatin (LIPITOR) 40 MG tablet Take 1 tablet (40 mg total) by mouth daily at 6 PM. 07/05/18   Deboraha Sprang, MD  carvedilol (COREG) 3.125 MG tablet Take 1 tablet (3.125 mg total) by mouth 2 (two) times daily with a meal. 09/12/18   Deboraha Sprang, MD  Cholecalciferol (VITAMIN D-3) 25 MCG (1000 UT) CAPS Take 1,000 Units by mouth at bedtime.     [provider]  Ensure (ENSURE) Take 237 mLs by mouth daily.    [provider]  ferrous gluconate (FERGON) 324 MG tablet Take 324 mg by mouth 2 (two) times daily.  07/05/18   [provider]  furosemide (LASIX) 80 MG tablet Take 0.5 tablets (40 mg total) by mouth daily. 04/19/19   Nita Sells, MD  lactulose (CEPHULAC) 20 g packet Take 1 packet (20 g total) by mouth 2 (two) times daily.  Hold for 3 or more than 3 bowel movements per day. 06/07/19 07/07/19  Darliss Cheney, MD  magnesium oxide (MAGNESIUM-OXIDE) 400 (241.3 Mg) MG tablet Take 1 tablet (400 mg total) by mouth daily. 05/14/19   Guilford Shi, MD  mexiletine (MEXITIL) 200 MG capsule Take 1 capsule (200 mg total) by mouth 2 (two) times daily. 01/24/19   [name], PA-C  nitroGLYCERIN (NITROSTAT) 0.4 MG SL tablet Place 1 tablet (0.4 mg total) under the tongue every 5 (  five) minutes as needed for chest pain. 01/12/19   Guilford Shi, MD  ondansetron (ZOFRAN ODT) 4 MG disintegrating tablet Take 1 tablet (4 mg total) by mouth every 8 (eight) hours as needed for nausea or vomiting. 03/21/19   [place] Donnamarie Poag, DO  pantoprazole (PROTONIX) 40 MG tablet Take 1 tablet (40 mg total) by mouth daily. 04/05/19 07/04/19  [place] Donnamarie Poag, DO  polyethylene glycol (MIRALAX / GLYCOLAX) 17 g packet Take 17 g by mouth daily. Can take up to twice a day for constipation. Patient not taking: Reported on 05/27/2019 03/19/19   Debbe Odea, MD  senna-docusate (SENOKOT-S) 8.6-50 MG tablet Take 1 tablet by mouth at bedtime as needed for mild constipation. 05/10/19   Charolotte Capuchin, MD  sucralfate (CARAFATE) 1 GM/10ML suspension Take 10 mLs (1 g total) by mouth 4 (four) times daily -  with meals and at bedtime. 03/19/19   Debbe Odea, MD   Allergies  Allergen Reactions  . Strawberry Extract Hives, Itching and Swelling        CBC:    Component Value Date/Time   WBC 13.5 (H) 06/10/2019 0432   HGB 7.2 (L) 06/10/2019 0432   HGB 11.4 12/19/2018 0906   HCT 22.1 (L) 06/10/2019 0432   HCT 36.4 12/19/2018 0906   PLT 214 06/10/2019 0432   PLT 226 12/19/2018 0906   MCV 79.2 (L) 06/10/2019 0432   MCV 74 (L) 12/19/2018 0906   NEUTROABS 15.3 (H) 06/09/2019 1541   NEUTROABS 3.8 08/30/2017 1600   LYMPHSABS 1.1 06/09/2019 1541   LYMPHSABS 1.7 08/30/2017 1600   MONOABS 1.5 (H) 06/09/2019 1541   EOSABS 0.1 06/09/2019 1541   EOSABS 0.2  08/30/2017 1600   BASOSABS 0.1 06/09/2019 1541   BASOSABS 0.0 08/30/2017 1600   Comprehensive Metabolic Panel:    Component Value Date/Time   NA 141 06/10/2019 0432   NA 135 12/19/2018 0906   K 4.2 06/10/2019 0432   CL 106 06/10/2019 0432   CO2 24 06/10/2019 0432   BUN 39 (H) 06/10/2019 0432   BUN 27 12/19/2018 0906   CREATININE 2.52 (H) 06/10/2019 0432   CREATININE 1.60 (H) 03/16/2016 1457   GLUCOSE 101 (H) 06/10/2019 0432   CALCIUM 8.1 (L) 06/10/2019 0432   AST 76 (H) 06/10/2019 0432   ALT 35 06/10/2019 0432   ALKPHOS 104 06/10/2019 0432   BILITOT 1.4 (H) 06/10/2019 0432   BILITOT 0.9 12/19/2018 0906   PROT 6.2 (L) 06/10/2019 0432   PROT 7.5 12/19/2018 0906   ALBUMIN 1.9 (L) 06/10/2019 0432   ALBUMIN 3.8 12/19/2018 0906    Physical Exam:  Vital Signs: BP (!) 115/48   Pulse 60   Temp 98.2 F (36.8 C) (Oral)   Resp (!) 24   Wt 61.9 kg   SpO2 100%   BMI 26.65 kg/m  SpO2: SpO2: 100 % O2 Device: O2 Device: Nasal Cannula O2 Flow Rate: O2 Flow Rate (L/min): 2 L/min Intake/output summary:   Intake/Output Summary (Last 24 hours) at 06/10/2019 0947 Last data filed at 06/10/2019 0245 Gross per 24 hour  Intake 720 ml  Output -  Net 720 ml   LBM:   Baseline Weight: Weight: 61.9 kg Most recent weight: Weight: 61.9 kg  Exam Findings:   Gen:  Elderly F appears stated age in NAD HEENT: dry mucous membranes CV: Regular rate and rhythm, no murmurs rubs or gallops PULM: On 2LPM [place], crackles in bilateral lung fields ABD: distended/tender /hyperactivel bowel sounds  EXT: No edema  Neuro: Alert and oriented x2         Palliative Performance Scale: 50%            Additional Data Reviewed: Recent Labs    06/09/19 1541 06/10/19 0432  WBC 18.1* 13.5*  HGB 8.4* 7.2*  PLT 284 214  NA 138 141  BUN 39* 39*  CREATININE 2.86* 2.52*   Time In: 1015 Time Out: 1145 Time Total: 75  Greater than 50%  of this time was spent counseling and coordinating care related to  the above assessment and plan.  Discussed with  Dr.  Hollice Gong  Signed by: Rosezella Rumpf, NP  Rosezella Rumpf, NP  06/10/2019, 9:47 AM  Please contact Palliative Medicine Team phone at [phone] for questions and concerns.   See AMION for contact information

## 2019-06-10 NOTE — Evaluation (Signed)
Physical Therapy Evaluation Patient Details Name: Stephanie Yates MRN: [id number] DOB: 01-11-1946 Today's Date: 06/10/2019   History of Present Illness  73yo female with recent hospitalization due to CHF exacerbation and fluid overload; during this stay had a neurological event (possible CVA vs seizures), and was ultimately discharged to SNF. SHe now returns from SNF due to increased lethargy and increased SOB. PMH chornic anemia, CHF, CKD stage IV, HLD, HTN, A-fib, mitral regurgitation, V-tach s/p AICD/PPM, ongoing GIB  Clinical Impression   Patient received in bed, lethargic and with garbled speech but willing to participate in PT eval today. She required MaxAx2 for functional bed mobility, once at EOB initially required ModA for balance, this faded to min guard but increased back to Mod-MaxA due to posterior and R Lean as fatigue increased. Difficulty even lifting her head up and with poor activation of core musculature, quite a bit of difficulty with dynamic tasks at EOB. She was returned to supine and repositioned in bed with MaxAx2, bed alarm active and all other needs met this afternoon. Currently recommend return to SNF with 24/7A once medically appropriate.     Follow Up Recommendations SNF;Supervision/Assistance - 24 hour    Equipment Recommendations  Rolling walker with 5" wheels;3in1 (PT)    Recommendations for Other Services       Precautions / Restrictions Precautions Precautions: Fall Restrictions Weight Bearing Restrictions: No      Mobility  Bed Mobility Overal bed mobility: Needs Assistance Bed Mobility: Supine to Sit;Sit to Supine     Supine to sit: Max assist;+2 for physical assistance Sit to supine: Max assist;+2 for physical assistance   General bed mobility comments: MaxAx2 for safe functional bed mobility, fatigues quite easy and with difficulty holding head up, cues for upright posture and core activation; initially min guard but increased to Mod-MaxA due  to R and posterior lean  Transfers                 General transfer comment: deferred  Ambulation/Gait             General Gait Details: deferred- fatigue  Stairs            Wheelchair Mobility    Modified Rankin (Stroke Patients Only) Modified Rankin (Stroke Patients Only) Pre-Morbid Rankin Score: Slight disability Modified Rankin: Severe disability     Balance Overall balance assessment: Needs assistance Sitting-balance support: Bilateral upper extremity supported;Feet supported Sitting balance-Leahy Scale: Poor Sitting balance - Comments: initially min guard, increased to Mod-MaxA to maintain upright Postural control: Posterior lean;Right lateral lean   Standing balance-Leahy Scale: Zero                               Pertinent Vitals/Pain Pain Assessment: Faces Faces Pain Scale: Hurts even more Pain Location: stomach upset Pain Descriptors / Indicators: Discomfort Pain Intervention(s): Limited activity within patient's tolerance;Monitored during session    Home Living Family/patient expects to be discharged to:: Private residence Living Arrangements: Alone Available Help at Discharge: Family;Available PRN/intermittently Type of Home: House Home Access: Stairs to enter Entrance Stairs-Rails: Can reach both Entrance Stairs-Number of Steps: 3 Home Layout: One level Home Equipment: Walker - 2 wheels      Prior Function Level of Independence: Independent         Comments: pt reports being independent with all ADLs including cooking and housework, denies any recent falls     Hand Dominance  Extremity/Trunk Assessment   Upper Extremity Assessment Upper Extremity Assessment: Defer to OT evaluation    Lower Extremity Assessment Lower Extremity Assessment: RLE deficits/detail;LLE deficits/detail RLE Deficits / Details: quite weak- 2+ to 3-/5 RLE Sensation: decreased proprioception RLE Coordination: decreased gross  motor LLE Deficits / Details: quite weak- 2+ to 3-/5 LLE Sensation: decreased proprioception LLE Coordination: decreased gross motor    Cervical / Trunk Assessment Cervical / Trunk Assessment: Kyphotic  Communication   Communication: No difficulties  Cognition Arousal/Alertness: Lethargic Behavior During Therapy: Flat affect Overall Cognitive Status: Impaired/Different from baseline Area of Impairment: Attention;Memory;Following commands;Safety/judgement;Problem solving;Orientation                 Orientation Level: Disoriented to;Place;Time;Situation Current Attention Level: Focused Memory: Decreased short-term memory Following Commands: Follows one step commands with increased time;Follows one step commands inconsistently Safety/Judgement: Decreased awareness of deficits;Decreased awareness of safety   Problem Solving: Difficulty sequencing;Decreased initiation;Requires verbal cues;Slow processing;Requires tactile cues General Comments: slow to respond to cues and requires extended time, speech garbled and can be difficult to understand      General Comments      Exercises     Assessment/Plan    PT Assessment Patient needs continued PT services  PT Problem List Decreased strength;Decreased mobility;Decreased activity tolerance;Decreased balance;Pain;Decreased safety awareness;Decreased knowledge of use of DME       PT Treatment Interventions DME instruction;Gait training;Stair training;Functional mobility training;Therapeutic activities;Therapeutic exercise;Balance training;Patient/family education;Neuromuscular re-education    PT Goals (Current goals can be found in the Care Plan section)  Acute Rehab PT Goals Patient Stated Goal: feel better, less stomach pain PT Goal Formulation: With patient Time For Goal Achievement: 06/24/19 Potential to Achieve Goals: Fair    Frequency Min 2X/week   Barriers to discharge Decreased caregiver support lives alone and  grandson can only check on her intermittently    Co-evaluation PT/OT/SLP Co-Evaluation/Treatment: Yes Reason for Co-Treatment: Complexity of the patient's impairments (multi-system involvement);For patient/therapist safety;To address functional/ADL transfers PT goals addressed during session: Mobility/safety with mobility;Balance         AM-PAC PT "6 Clicks" Mobility  Outcome Measure Help needed turning from your back to your side while in a flat bed without using bedrails?: A Lot Help needed moving from lying on your back to sitting on the side of a flat bed without using bedrails?: A Lot Help needed moving to and from a bed to a chair (including a wheelchair)?: Total Help needed standing up from a chair using your arms (e.g., wheelchair or bedside chair)?: Total Help needed to walk in hospital room?: Total Help needed climbing 3-5 steps with a railing? : Total 6 Click Score: 8    End of Session Equipment Utilized During Treatment: Oxygen Activity Tolerance: Patient limited by fatigue;Patient limited by lethargy Patient left: in bed;with call bell/phone within reach;with bed alarm set Nurse Communication: Mobility status;Other (comment)(patient requesting stomach medicine) PT Visit Diagnosis: Muscle weakness (generalized) (M62.81);Difficulty in walking, not elsewhere classified (R26.2);Other symptoms and signs involving the nervous system (R29.898) Pain - Right/Left: (bilateral) Pain - part of body: (abdomen)    Time: 5625-6389 PT Time Calculation (min) (ACUTE ONLY): 25 min   Charges:   PT Evaluation $PT Eval Moderate Complexity: 1 Mod          Windell Norfolk, DPT, PN1   Supplemental Physical Therapist [place]    Pager [phone] Acute Rehab Office [phone]

## 2019-06-10 NOTE — Evaluation (Signed)
Occupational Therapy Evaluation Patient Details Name: Stephanie Yates MRN: [id number] DOB: 1945-11-24 Today's Date: 06/10/2019    History of Present Illness 73yo female with recent hospitalization due to CHF exacerbation and fluid overload; during this stay had a neurological event (possible CVA vs seizures), and was ultimately discharged to SNF. SHe now returns from SNF due to increased lethargy and increased SOB. PMH chornic anemia, CHF, CKD stage IV, HLD, HTN, A-fib, mitral regurgitation, V-tach s/p AICD/PPM, ongoing GIB   Clinical Impression   Patient is a 73 year old female that lives alone in a single level home with 3 steps to enter. At baseline patient was independent with self care, IADLs, mobility without AD. Currently, patient exhibits decreased alertness, cognition, strength, balance, and is requiring increased assistance for BADLs and bed mobility. Patient max A x2 with verbal cues for sequencing bed mobility. Upon initially sitting patient min guard for balance however fatigues requiring mod to maximal assistance. Seated EOB patient require mod A for drinking and washing her face due due to limited balance, strength, arousal. Patient requires mod to max cues to lift her head and keep her eyes open. Recommend continued acute OT services during hospitalization due to listed deficits.     Follow Up Recommendations  SNF;Supervision/Assistance - 24 hour    Equipment Recommendations  Other (comment)(to be determined)       Precautions / Restrictions Precautions Precautions: Fall Restrictions Weight Bearing Restrictions: No      Mobility Bed Mobility Overal bed mobility: Needs Assistance Bed Mobility: Supine to Sit;Sit to Supine     Supine to sit: Max assist;+2 for physical assistance Sit to supine: Max assist;+2 for physical assistance   General bed mobility comments: MaxAx2 for safe functional bed mobility, fatigues quite easy and with difficulty holding head up, cues for  upright posture and core activation; initially min guard but increased to Mod-MaxA due to R and posterior lean  Transfers Overall transfer level: Needs assistance               General transfer comment: deferred    Balance Overall balance assessment: Needs assistance Sitting-balance support: Bilateral upper extremity supported;Feet supported Sitting balance-Leahy Scale: Poor Sitting balance - Comments: initially min guard, increased to Mod-MaxA to maintain upright Postural control: Posterior lean;Right lateral lean   Standing balance-Leahy Scale: Zero Standing balance comment: did not attempt standing this session                           ADL either performed or assessed with clinical judgement   ADL Overall ADL's : Needs assistance/impaired Eating/Feeding: Moderate assistance;Sitting Eating/Feeding Details (indicate cue type and reason): decreased grip strength with cup falling x1, require mod A to hold cup and for excursion to mouth Grooming: Wash/dry face;Moderate assistance;Sitting Grooming Details (indicate cue type and reason): decreased activity tolerance and sitting balance, mod A for thoroughness Upper Body Bathing: Moderate assistance;Sitting   Lower Body Bathing: Total assistance;Sitting/lateral leans   Upper Body Dressing : Moderate assistance;Sitting   Lower Body Dressing: Total assistance;Sitting/lateral leans   Toilet Transfer: +2 for physical assistance;+2 for safety/equipment Toilet Transfer Details (indicate cue type and reason): due to limited activity tolerance, alertness and strength did not attempt transfer this date. anticipate extensive assist x2 Toileting- Clothing Manipulation and Hygiene: Total assistance                            Pertinent Vitals/Pain  Pain Assessment: Faces Faces Pain Scale: Hurts even more Pain Location: stomach upset Pain Descriptors / Indicators: Discomfort Pain Intervention(s): Limited activity  within patient's tolerance;Monitored during session;Other (comment)(notified RN)     Hand Dominance Right   Extremity/Trunk Assessment Upper Extremity Assessment Upper Extremity Assessment: Generalized weakness   Lower Extremity Assessment Lower Extremity Assessment: Defer to PT evaluation RLE Deficits / Details: quite weak- 2+ to 3-/5 RLE Sensation: decreased proprioception RLE Coordination: decreased gross motor LLE Deficits / Details: quite weak- 2+ to 3-/5 LLE Sensation: decreased proprioception LLE Coordination: decreased gross motor   Cervical / Trunk Assessment Cervical / Trunk Assessment: Kyphotic   Communication Communication Communication: Other (comment)(mumbling/limited voice projection, difficult to understand)   Cognition Arousal/Alertness: Lethargic Behavior During Therapy: Flat affect Overall Cognitive Status: Impaired/Different from baseline Area of Impairment: Attention;Following commands;Safety/judgement;Awareness;Memory;Problem solving                 Orientation Level: Disoriented to;Place;Time;Situation Current Attention Level: Focused;Sustained Memory: Decreased short-term memory Following Commands: Follows one step commands inconsistently;Follows one step commands with increased time Safety/Judgement: Decreased awareness of deficits;Decreased awareness of safety Awareness: Intellectual Problem Solving: Slow processing;Decreased initiation;Difficulty sequencing;Requires verbal cues;Requires tactile cues General Comments: requires increased time to respond to direction/cues              Home Living Family/patient expects to be discharged to:: Private residence Living Arrangements: Alone Available Help at Discharge: Family;Available PRN/intermittently Type of Home: House Home Access: Stairs to enter [employer] of Steps: 3 Entrance Stairs-Rails: Can reach both Home Layout: One level     Bathroom Shower/Tub: Animal nutritionist: Standard     Home Equipment: Environmental consultant - 2 wheels   Additional Comments: did not use AD      Prior Functioning/Environment Level of Independence: Independent        Comments: pt reports being independent with all ADLs including cooking and housework, denies any recent falls        OT Problem List: Decreased strength;Decreased activity tolerance;Impaired balance (sitting and/or standing);Decreased coordination;Decreased cognition;Decreased safety awareness;Decreased knowledge of use of DME or AE;Pain      OT Treatment/Interventions: Self-care/ADL training;Therapeutic exercise;Energy conservation;DME and/or AE instruction;Therapeutic activities;Cognitive remediation/compensation;Patient/family education;Balance training    OT Goals(Current goals can be found in the care plan section) Acute Rehab OT Goals Patient Stated Goal: feel better, less stomach pain OT Goal Formulation: With patient Time For Goal Achievement: 06/24/19 Potential to Achieve Goals: Good ADL Goals Pt Will Perform Grooming: with supervision;sitting Pt Will Perform Upper Body Dressing: with supervision;sitting Pt Will Transfer to Toilet: with mod assist;squat pivot transfer;bedside commode Additional ADL Goal #1: Patient will sit up at edge of bed for 8 minutes at supervision level in order to perform self care tasks.  OT Frequency: Min 2X/week        Co-evaluation PT/OT/SLP Co-Evaluation/Treatment: Yes Reason for Co-Treatment: Complexity of the patient's impairments (multi-system involvement);For patient/therapist safety;To address functional/ADL transfers PT goals addressed during session: Mobility/safety with mobility;Balance OT goals addressed during session: ADL's and self-care      AM-PAC OT "6 Clicks" Daily Activity     Outcome Measure Help from another person eating meals?: A Lot Help from another person taking care of personal grooming?: A Lot Help from another person toileting,  which includes using toliet, bedpan, or urinal?: Total Help from another person bathing (including washing, rinsing, drying)?: A Lot Help from another person to put on and taking off regular upper body clothing?: A Lot Help from another  person to put on and taking off regular lower body clothing?: Total 6 Click Score: 10   End of Session Equipment Utilized During Treatment: Oxygen Nurse Communication: Mobility status  Activity Tolerance: Patient limited by fatigue;Patient limited by lethargy Patient left: in bed;with call bell/phone within reach;with bed alarm set  OT Visit Diagnosis: Muscle weakness (generalized) (M62.81);Other abnormalities of gait and mobility (R26.89);Other symptoms and signs involving cognitive function                Time: 1300-1327 OT Time Calculation (min): 27 min Charges:  OT General Charges $OT Visit: 1 Visit OT Evaluation $OT Eval Moderate Complexity: [place] OT OT office: [place] 06/10/2019, 2:10 PM

## 2019-06-10 NOTE — ED Notes (Signed)
ED TO INPATIENT HANDOFF REPORT  ED Nurse Name and Phone #: Saajan Willmon, [phone]  S Name/Age/Gender Stephanie Yates 73 y.o. female Room/Bed: 008C/008C  Code Status   Code Status: Partial Code  Home/SNF/Other Nursing Home Patient oriented to: self, place, time and situation Is this baseline? Yes   Triage Complete: Triage complete  Chief Complaint CHF exacerbation [facility]) [I50.9]  Triage Note Pt from [facility] rehab. Reports difficulty breathing since 1100 this AM. Lethargic and SOB. 100% on RA, wheezing heard. 5 mg or albuterol given en route.     Allergies Allergies  Allergen Reactions  . Strawberry Extract Hives, Itching and Swelling         Level of Care/Admitting Diagnosis ED Disposition    ED Disposition Condition Comment   Admit  Hospital Area: [place] [facility] [100100]  Level of Care: Telemetry Cardiac [103]  Covid Evaluation: Confirmed COVID Negative  Diagnosis: CHF exacerbation [facility]) [824235]  Admitting Physician: Toy Baker [[id number]]  Attending Physician: Toy Baker [[id number]]  Estimated length of stay: 3 - 4 days  Certification:: I certify this patient will need inpatient services for at least 2 midnights       B Medical/Surgery History Past Medical History:  Diagnosis Date  . Chronic anemia   . Chronic systolic CHF (congestive heart failure) ([place])   . CKD (chronic kidney disease), stage IV ([place])   . Former tobacco use   . History of tobacco abuse   . Hyperlipidemia   . Hypertension   . Mitral regurgitation   . Nonischemic cardiomyopathy ([place])    a. EF 10-15% on 11/2012 cath with no significant CAD. b. EF 25% in 2019. b. EF 15% in 12/2018  . PAF (paroxysmal atrial fibrillation) ([place])   . Protein calorie malnutrition ([place])   . Quit consuming alcohol in remote past   . Sinus bradycardia 12/21/2012  . Ventricular tachycardia ([place]) 12/14/2012   Past Surgical History:  Procedure Laterality Date  . BIOPSY  01/22/2019   Procedure:  BIOPSY;  Surgeon: Wilford Corner, MD;  Location: [place];  Service: Endoscopy;;  . COLONOSCOPY WITH PROPOFOL N/A 03/19/2019   Procedure: COLONOSCOPY WITH PROPOFOL;  Surgeon: Otis Brace, MD;  Location: [place];  Service: Gastroenterology;  Laterality: N/A;  . ESOPHAGOGASTRODUODENOSCOPY (EGD) WITH PROPOFOL N/A 06/18/2017   Procedure: ESOPHAGOGASTRODUODENOSCOPY (EGD) WITH PROPOFOL;  Surgeon: Ronnette Juniper, MD;  Location: [place];  Service: Gastroenterology;  Laterality: N/A;  . ESOPHAGOGASTRODUODENOSCOPY (EGD) WITH PROPOFOL N/A 01/22/2019   Procedure: ESOPHAGOGASTRODUODENOSCOPY (EGD) WITH PROPOFOL;  Surgeon: Wilford Corner, MD;  Location: [place];  Service: Endoscopy;  Laterality: N/A;  . ESOPHAGOGASTRODUODENOSCOPY (EGD) WITH PROPOFOL N/A 03/19/2019   Procedure: ESOPHAGOGASTRODUODENOSCOPY (EGD) WITH PROPOFOL;  Surgeon: Otis Brace, MD;  Location: [facility] ENDOSCOPY;  Service: Gastroenterology;  Laterality: N/A;  . IMPLANTABLE CARDIOVERTER DEFIBRILLATOR IMPLANT  12/19/2012   St. Jude dual-chamber ICD, serial number [id number]  . IMPLANTABLE CARDIOVERTER DEFIBRILLATOR IMPLANT N/A 12/19/2012   Procedure: IMPLANTABLE CARDIOVERTER DEFIBRILLATOR IMPLANT;  Surgeon: Deboraha Sprang, MD;  Location: [facility] CATH LAB;  Service: Cardiovascular;  Laterality: N/A;  . LEFT AND RIGHT HEART CATHETERIZATION WITH CORONARY ANGIOGRAM N/A 12/17/2012   Procedure: LEFT AND RIGHT HEART CATHETERIZATION WITH CORONARY ANGIOGRAM;  Surgeon: Sherren Mocha, MD;  Location: [facility] CATH LAB;  Service: Cardiovascular;  Laterality: N/A;  . POLYPECTOMY  03/19/2019   Procedure: POLYPECTOMY;  Surgeon: Otis Brace, MD;  Location: [place] ENDOSCOPY;  Service: Gastroenterology;;  . RIGHT HEART CATH N/A 05/30/2019   Procedure: RIGHT HEART CATH;  Surgeon: Glori Bickers  R, MD;  Location: [place] CV LAB;  Service: Cardiovascular;  Laterality: N/A;  . SCLEROTHERAPY  01/22/2019   Procedure: SCLEROTHERAPY;  Surgeon: Wilford Corner, MD;  Location: [facility] ENDOSCOPY;  Service: Endoscopy;;     A IV Location/Drains/Wounds Patient Lines/Drains/Airways Status   Active Line/Drains/Airways    Name:   Placement date:   Placement time:   Site:   Days:   Peripheral IV 06/09/19 Left Antecubital   06/09/19    1543    Antecubital   1   Pressure Injury 05/29/19 Buttocks Left;Right Stage I -  Intact skin with non-blanchable redness of a localized area usually over a bony prominence.   05/29/19    1105     12   Pressure Injury 05/31/19 Buttocks Left Stage II -  Partial thickness loss of dermis presenting as a shallow open ulcer with a red, pink wound bed without slough.   05/31/19    0800     10          Intake/Output Last 24 hours No intake or output data in the 24 hours ending 06/10/19 0059  Labs/Imaging Results for orders placed or performed during the hospital encounter of 06/09/19 (from the past 48 hour(s))  Basic metabolic panel     Status: Abnormal   Collection Time: 06/09/19  3:41 PM  Result Value Ref Range   Sodium 138 135 - 145 mmol/L   Potassium 4.9 3.5 - 5.1 mmol/L   Chloride 105 98 - 111 mmol/L   CO2 25 22 - 32 mmol/L   Glucose, Bld 158 (H) 70 - 99 mg/dL   BUN 39 (H) 8 - 23 mg/dL   Creatinine, Ser 2.86 (H) 0.44 - 1.00 mg/dL   Calcium 8.4 (L) 8.9 - 10.3 mg/dL   GFR calc non Af Amer 15 (L) >60 mL/min   GFR calc Af Amer 18 (L) >60 mL/min   Anion gap 8 5 - 15    Comment: Performed at [facility], [address], [place], [place] [address]  Troponin I (High Sensitivity)     Status: Abnormal   Collection Time: 06/09/19  3:41 PM  Result Value Ref Range   Troponin I (High Sensitivity) 31 (H) <18 ng/L    Comment: (NOTE) Elevated high sensitivity troponin I (hsTnI) values and significant  changes across serial measurements may suggest ACS but many other  chronic and acute conditions are known to elevate hsTnI results.  Refer to the "Links" section for chest pain algorithms and additional   guidance. Performed at [facility], [place] [address] [place], [place] [address]   CBC with Differential     Status: Abnormal   Collection Time: 06/09/19  3:41 PM  Result Value Ref Range   WBC 18.1 (H) 4.0 - 10.5 K/uL   RBC 3.31 (L) 3.87 - 5.11 MIL/uL   Hemoglobin 8.4 (L) 12.0 - 15.0 g/dL   HCT 26.1 (L) 36.0 - 46.0 %   MCV 78.9 (L) 80.0 - 100.0 fL   MCH 25.4 (L) 26.0 - 34.0 pg   MCHC 32.2 30.0 - 36.0 g/dL   RDW 16.2 (H) 11.5 - 15.5 %   Platelets 284 150 - 400 K/uL   nRBC 0.0 0.0 - 0.2 %   Neutrophils Relative % 84 %   Neutro Abs 15.3 (H) 1.7 - 7.7 K/uL   Lymphocytes Relative 6 %   Lymphs Abs 1.1 0.7 - 4.0 K/uL   Monocytes Relative 8 %  Monocytes Absolute 1.5 (H) 0.1 - 1.0 K/uL   Eosinophils Relative 1 %   Eosinophils Absolute 0.1 0.0 - 0.5 K/uL   Basophils Relative 0 %   Basophils Absolute 0.1 0.0 - 0.1 K/uL   Immature Granulocytes 1 %   Abs Immature Granulocytes 0.11 (H) 0.00 - 0.07 K/uL    Comment: Performed at [facility] [address] [place], [place] [address]  Brain natriuretic peptide     Status: Abnormal   Collection Time: 06/09/19  3:41 PM  Result Value Ref Range   B Natriuretic Peptide 2,039.7 (H) 0.0 - 100.0 pg/mL    Comment: Performed at [facility] [address], [place], [place] [address]  POC SARS Coronavirus 2 Ag-ED - Nasal Swab (BD Veritor Kit)     Status: None   Collection Time: 06/09/19  4:29 PM  Result Value Ref Range   SARS Coronavirus 2 Ag NEGATIVE NEGATIVE    Comment: (NOTE) SARS-CoV-2 antigen NOT DETECTED.  Negative results are presumptive.  Negative results do not preclude SARS-CoV-2 infection and should not be used as the sole basis for treatment or other patient management decisions, including infection  control decisions, particularly in the presence of clinical signs and  symptoms consistent with COVID-19, or in those who have been in contact with the virus.  Negative results must be combined with clinical  observations, patient history, and epidemiological information. The expected result is Negative. Fact Sheet for Patients: PodPark.tn Fact Sheet for Healthcare Providers: GiftContent.is This test is not yet approved or cleared by the [place] FDA and  has been authorized for detection and/or diagnosis of SARS-CoV-2 by FDA under an Emergency Use Authorization (EUA).  This EUA will remain in effect (meaning this test can be used) for the duration of  the COVID-19 de claration under Section 564(b)(1) of the Act, 21 U.S.C. section 360bbb-3(b)(1), unless the authorization is terminated or revoked sooner.   SARS CORONAVIRUS 2 (TAT 6-24 HRS) Nasopharyngeal Nasopharyngeal Swab     Status: None   Collection Time: 06/09/19  4:53 PM   Specimen: Nasopharyngeal Swab  Result Value Ref Range   SARS Coronavirus 2 NEGATIVE NEGATIVE    Comment: (NOTE) SARS-CoV-2 target nucleic acids are NOT DETECTED. The SARS-CoV-2 RNA is generally detectable in upper and lower respiratory specimens during the acute phase of infection. Negative results do not preclude SARS-CoV-2 infection, do not rule out co-infections with other pathogens, and should not be used as the sole basis for treatment or other patient management decisions. Negative results must be combined with clinical observations, patient history, and epidemiological information. The expected result is Negative. Fact Sheet for Patients: SugarRoll.be Fact Sheet for Healthcare Providers: https://www.woods-mathews.com/ This test is not yet approved or cleared by the [place] FDA and  has been authorized for detection and/or diagnosis of SARS-CoV-2 by FDA under an Emergency Use Authorization (EUA). This EUA will remain  in effect (meaning this test can be used) for the duration of the COVID-19 declaration under Section 56 4(b)(1) of the Act, 21  U.S.C. section 360bbb-3(b)(1), unless the authorization is terminated or revoked sooner. Performed at [facility], [place] [address], [place], [place] [address]   Troponin I (High Sensitivity)     Status: Abnormal   Collection Time: 06/09/19  5:41 PM  Result Value Ref Range   Troponin I (High Sensitivity) 28 (H) <18 ng/L    Comment: (NOTE) Elevated high sensitivity troponin I (hsTnI) values and significant  changes  across serial measurements may suggest ACS but many other  chronic and acute conditions are known to elevate hsTnI results.  Refer to the "Links" section for chest pain algorithms and additional  guidance. Performed at [facility], [place] [address], [place], [place] [address]   Ammonia     Status: Abnormal   Collection Time: 06/09/19  7:35 PM  Result Value Ref Range   Ammonia 44 (H) 9 - 35 umol/L    Comment: Performed at [facility], [place] [address] [place], [place] [address]  Urinalysis, Routine w reflex microscopic     Status: Abnormal   Collection Time: 06/09/19  8:06 PM  Result Value Ref Range   Color, Urine YELLOW YELLOW   APPearance CLEAR CLEAR   Specific Gravity, Urine 1.013 1.005 - 1.030   pH 5.0 5.0 - 8.0   Glucose, UA NEGATIVE NEGATIVE mg/dL   Hgb urine dipstick SMALL (A) NEGATIVE   Bilirubin Urine NEGATIVE NEGATIVE   Ketones, ur NEGATIVE NEGATIVE mg/dL   Protein, ur NEGATIVE NEGATIVE mg/dL   Nitrite NEGATIVE NEGATIVE   Leukocytes,Ua NEGATIVE NEGATIVE   RBC / HPF 0-5 0 - 5 RBC/hpf   WBC, UA 0-5 0 - 5 WBC/hpf   Bacteria, UA RARE (A) NONE SEEN   Squamous Epithelial / LPF 0-5 0 - 5   Mucus PRESENT    Hyaline Casts, UA PRESENT     Comment: Performed at [facility], [address] [place], [place] [address]   DG Chest Port 1 View  Result Date: 06/09/2019 CLINICAL DATA:  Pt unable to answer hx questions. Only moaned when Tech questioned her. Per nurse: Pt from [facility] rehab. Reports difficulty breathing since 1100 this AM.  Lethargic and SOB. 100% on RA, wheezing heard. 5 mg or albuterol given en route. EXAM: PORTABLE CHEST 1 VIEW COMPARISON:  Chest radiograph 06/05/2019 FINDINGS: Stable cardiomediastinal contours with enlarged heart size. Left ICD in place. Central pulmonary vascular congestion. Diffuse mild bilateral interstitial opacities likely reflecting trace edema. No new focal consolidation. No pneumothorax or large pleural effusion. No acute finding in the visualized skeleton. IMPRESSION: Cardiomegaly with central pulmonary vascular congestion and mild bilateral interstitial opacities likely reflecting edema. Electronically Signed   By: Audie Pinto M.D.   On: 06/09/2019 15:59    Pending Labs Unresulted Labs (From admission, onward)    Start     Ordered   06/10/19 0500  Vitamin B12  (Anemia Panel (PNL))  Tomorrow morning,   R     06/09/19 2040   06/10/19 0500  Folate  (Anemia Panel (PNL))  Tomorrow morning,   R     06/09/19 2040   06/10/19 0500  Iron and TIBC  (Anemia Panel (PNL))  Tomorrow morning,   R     06/09/19 2040   06/10/19 0500  Ferritin  (Anemia Panel (PNL))  Tomorrow morning,   R     06/09/19 2040   06/10/19 0500  Reticulocytes  (Anemia Panel (PNL))  Tomorrow morning,   R     06/09/19 2040   06/10/19 0500  Magnesium  Tomorrow morning,   R    Comments: Call MD if <1.5    06/10/19 0029   06/10/19 0500  Phosphorus  Tomorrow morning,   R     06/10/19 0029   06/10/19 0500  TSH  Once,   STAT    Comments: Cancel if already done within 1 month and notify MD    06/10/19 1607  06/10/19 0500  Comprehensive metabolic panel  Once,   STAT    Comments: Cal MD for K<3.5 or >5.0    06/10/19 0029   06/10/19 0500  CBC  Once,   STAT    Comments: Call for hg <8.0    06/10/19 0029   06/09/19 2204  Blood gas, venous  ONCE - STAT,   R     06/09/19 2203   06/09/19 1956  Culture, Urine  Once,   STAT     06/09/19 1955          Vitals/Pain Today's Vitals   06/09/19 2200 06/09/19 2230 06/09/19  2300 06/10/19 0000  BP: (!) 124/58 (!) 124/58  (!) 109/51  Pulse: 61 60 60 (!) 59  Resp: _0 Temp:      TempSrc:      SpO2: 100% 100% 100% 100%    Isolation Precautions Airborne and Contact precautions  Medications Medications  furosemide (LASIX) injection 80 mg (has no administration in time range)  amiodarone (PACERONE) tablet 200 mg (has no administration in time range)  atorvastatin (LIPITOR) tablet 40 mg (has no administration in time range)  carvedilol (COREG) tablet 3.125 mg (has no administration in time range)  mexiletine (MEXITIL) capsule 200 mg (has no administration in time range)  lactulose (CHRONULAC) 10 GM/15ML solution 20 g (has no administration in time range)  pantoprazole (PROTONIX) EC tablet 40 mg (has no administration in time range)  sucralfate (CARAFATE) 1 GM/10ML suspension 1 g (has no administration in time range)  apixaban (ELIQUIS) tablet 2.5 mg (has no administration in time range)  ferrous gluconate (FERGON) tablet 324 mg (has no administration in time range)  albuterol (VENTOLIN HFA) 108 (90 Base) MCG/ACT inhaler 2 puff (has no administration in time range)  acetaminophen (TYLENOL) tablet 650 mg (has no administration in time range)    Or  acetaminophen (TYLENOL) suppository 650 mg (has no administration in time range)  HYDROcodone-acetaminophen (NORCO/VICODIN) 5-325 MG per tablet 1-2 tablet (has no administration in time range)  ondansetron (ZOFRAN) tablet 4 mg (has no administration in time range)    Or  ondansetron (ZOFRAN) injection 4 mg (has no administration in time range)  sodium chloride flush (NS) 0.9 % injection 3 mL (has no administration in time range)  sodium chloride flush (NS) 0.9 % injection 3 mL (has no administration in time range)  0.9 %  sodium chloride infusion (has no administration in time range)  furosemide (LASIX) injection 80 mg (80 mg Intravenous Given 06/09/19 1638)  albumin human 25 % solution 12.5 g (0 g  Intravenous Stopped 06/09/19 2218)    Mobility walks with device High fall risk   Focused Assessments Cardiac Assessment Handoff:    Lab Results  Component Value Date   CKTOTAL 21 (L) 05/30/2019   TROPONINI 0.72 ([place]) 06/07/2018   No results found for: DDIMER Does the Patient currently have chest pain? No     R Recommendations: See Admitting Provider Note  Report given to:   Additional Notes:

## 2019-06-10 NOTE — Progress Notes (Signed)
PROGRESS NOTE  Stephanie Yates QIW:[id number] DOB: 1946-01-26 DOA: 06/09/2019 PCP: Josetta Huddle, MD  Hospital Course/Subjective: 73 y.o. female with medical history significant of CHF/NICM (EF 10 to 15%), VT s/p AICD 2014 and biventricular PPM, paroxysmal A. fib/bradycardia, chronic anemia, CKD-4, HTN, HLD, moderate protein calorie malnutrition and GIB, prolonged QT/torsade and multiple hospitalizations Admitted for acute on chronic combined systolic diastolic CHF exacerbation.  This AM, she is lying in bed comfortably, saturating 100% on supplemental oxygen and has no complaints. Not really able to tell me why she ended up back in the hospital but when asked she says she thinks she was having trouble breathing.   Assessment/Plan: Acute on chronic combined systolic and diastolic CHF (congestive heart failure) ([place]) -  - admit on telemetry,  cycle cardiac enzymes,   Trop 31-28 obtain serial ECG  to evaluate for ischemia as a cause of heart failure  Last BNP BNP (last 3 results) Recent Labs (within last 365 days)       Recent Labs    05/23/19 0057 05/26/19 1822 06/09/19 1541  BNP 1,286.3* 1,587.2* 2,039.7*      diurese with IV lasix and monitor orthostatics and creatinine to avoid over diuresis.  Order echogram to evaluate EF and valves  ACE/ARBi   Contraindicated due to CKD cardiology consulted   . PAF (paroxysmal atrial fibrillation) (HCC) -           - CHA2DS2 vas score >5 : continue current anticoagulation with   Eliquis,           -  Rate control:  Currently controlled with Coreg will continue        - Rhythm control:  Continue amiodarone  . ICD (implantable cardioverter-defibrillator), dual, st [employer] - sp pacemaker  . HLD (hyperlipidemia) - stable continue home meds  . CKD (chronic kidney disease), stage III -chronic stable avoid nephrotoxic medications  . Symptomatic anemia -persistent anemia have had GI work-up in the past currently stable continue to  monitor, for now plan is iron and intermittent transfusion as needed   . Hypoalbuminemia - administered IV albumin as this may help with diuresis  . Leukocytosis - chronic stable  . Elevated troponin - chronically elevated stable  . Hypertension - chronic stable   Other plan as per orders.  DVT prophylaxis:  eliquis   Code Status: Partial code patient does not wish to be intubated as per patient  Admitting hospitalist had personally discussed CODE STATUS with patient   Family Communication:   Family not at  Bedside   Disposition Plan:                            Back to current facility when stable                                             Would benefit from PT/OT eval prior to DC  Ordered                                     Social Work  consulted                   Nutrition    consulted  Palliative care    consulted  Objective: Vitals:   06/10/19 0144 06/10/19 0622 06/10/19 0811 06/10/19 0824  BP: 130/65 (!) 102/50 (!) 94/45 (!) 115/48  Pulse: (!) 58 (!) 59  60  Resp: 17 (!) 24    Temp: 97.6 F (36.4 C) 97.7 F (36.5 C) 98.2 F (36.8 C)   TempSrc: Oral Oral Oral   SpO2: 100% 100%    Weight:  61.9 kg      Intake/Output Summary (Last 24 hours) at 06/10/2019 0844 Last data filed at 06/10/2019 0245 Gross per 24 hour  Intake 720 ml  Output --  Net 720 ml   Filed Weights   06/10/19 0622  Weight: 61.9 kg     Exam: General:  Alert, oriented to self, calm, in no acute distress, on O2 Eyes: EOMI, clear sclerea Neck: supple, no masses, trachea mildline  Cardiovascular: RRR, no murmurs or rubs, no peripheral edema  Respiratory: clear to auscultation bilaterally but diminished on the right, no wheezes, no crackles  Abdomen: soft, nontender, nondistended, normal bowel tones heard  Skin: dry, no rashes  Musculoskeletal: no joint effusions, normal range of motion  Psychiatric: appropriate affect, normal speech   Neurologic: extraocular muscles intact, clear speech, moving all extremities with intact sensorium    Data Reviewed: CBC: Recent Labs  Lab 06/05/19 0243 06/06/19 0405 06/06/19 1129 06/07/19 0153 06/09/19 1541 06/10/19 0432  WBC 16.3* 17.7* 24.2* 16.5* 18.1* 13.5*  NEUTROABS 12.5* 13.7* 19.1* 13.0* 15.3*  --   HGB 8.1* 7.1* 8.3* 7.3* 8.4* 7.2*  HCT 24.7* 21.3* 25.2* 22.2* 26.1* 22.1*  MCV 78.9* 77.5* 77.5* 77.9* 78.9* 79.2*  PLT 235 209 381 218 284 867   Basic Metabolic Panel: Recent Labs  Lab 06/05/19 0243 06/06/19 0405 06/07/19 0153 06/09/19 1541 06/10/19 0432  NA 139 139 139 138 141  K 3.8 4.4 3.4* 4.9 4.2  CL 102 104 105 105 106  CO2 25 23 25 25 24   GLUCOSE 94 91 104* 158* 101*  BUN 32* 33* 33* 39* 39*  CREATININE 2.31* 2.32* 2.39* 2.86* 2.52*  CALCIUM 8.4* 8.2* 8.2* 8.4* 8.1*  MG  --   --   --   --  2.0  PHOS  --   --   --   --  4.0   GFR: Estimated Creatinine Clearance: 15.9 mL/min (A) (by C-G formula based on SCr of 2.52 mg/dL (H)). Liver Function Tests: Recent Labs  Lab 06/04/19 0334 06/05/19 0243 06/06/19 0405 06/07/19 0153 06/10/19 0432  AST 114* 111* 99* 82* 76*  ALT 42 45* 40 36 35  ALKPHOS 132* 129* 120 110 104  BILITOT 1.3* 1.5* 1.0 1.5* 1.4*  PROT 6.2* 6.5 6.2* 6.0* 6.2*  ALBUMIN 1.7* 1.7* 1.8* 1.7* 1.9*   No results for input(s): LIPASE, AMYLASE in the last 168 hours. Recent Labs  Lab 06/04/19 0642 06/05/19 0444 06/09/19 1935  AMMONIA 124* 33 44*   Coagulation Profile: No results for input(s): INR, PROTIME in the last 168 hours. Cardiac Enzymes: No results for input(s): CKTOTAL, CKMB, CKMBINDEX, TROPONINI in the last 168 hours. BNP (last 3 results) No results for input(s): PROBNP in the last 8760 hours. HbA1C: No results for input(s): HGBA1C in the last 72 hours. CBG: Recent Labs  Lab 06/06/19 1124 06/06/19 1545 06/06/19 2056 06/07/19 0731 06/07/19 1107  GLUCAP 106* 100* 96 93 94   Lipid Profile: No results for  input(s): CHOL, HDL, LDLCALC, TRIG, CHOLHDL, LDLDIRECT in the last 72 hours. Thyroid Function Tests:  Recent Labs    06/10/19 0432  TSH 1.957   Anemia Panel: Recent Labs    06/10/19 0432  VITAMINB12 1,023*  FOLATE 15.2  FERRITIN 26  TIBC 223*  IRON 18*  RETICCTPCT 1.8   Urine analysis:    Component Value Date/Time   COLORURINE YELLOW 06/09/2019 2006   APPEARANCEUR CLEAR 06/09/2019 2006   LABSPEC 1.013 06/09/2019 2006   PHURINE 5.0 06/09/2019 2006   GLUCOSEU NEGATIVE 06/09/2019 2006   HGBUR SMALL (A) 06/09/2019 2006   BILIRUBINUR NEGATIVE 06/09/2019 2006   [place] 06/09/2019 2006   PROTEINUR NEGATIVE 06/09/2019 2006   UROBILINOGEN 1.0 08/29/2014 2035   NITRITE NEGATIVE 06/09/2019 2006   LEUKOCYTESUR NEGATIVE 06/09/2019 2006   Sepsis Labs: @LABRCNTIP (procalcitonin:4,lacticidven:4)  ) Recent Results (from the past 240 hour(s))  Culture, Urine     Status: Abnormal   Collection Time: 06/01/19  7:41 PM   Specimen: Urine, Clean Catch  Result Value Ref Range Status   Specimen Description URINE, CLEAN CATCH  Final   Special Requests   Final    NONE Performed at [facility], [place] [address], [place], [place] [address]    Culture >=100,000 COLONIES/mL KLEBSIELLA PNEUMONIAE (A)  Final   Report Status 06/04/2019 FINAL  Final   Organism ID, Bacteria KLEBSIELLA PNEUMONIAE (A)  Final      Susceptibility   Klebsiella pneumoniae - MIC*    AMPICILLIN RESISTANT Resistant     CEFAZOLIN <=4 SENSITIVE Sensitive     CEFTRIAXONE <=1 SENSITIVE Sensitive     CIPROFLOXACIN <=0.25 SENSITIVE Sensitive     GENTAMICIN <=1 SENSITIVE Sensitive     IMIPENEM <=0.25 SENSITIVE Sensitive     NITROFURANTOIN <=16 SENSITIVE Sensitive     TRIMETH/SULFA <=20 SENSITIVE Sensitive     AMPICILLIN/SULBACTAM <=2 SENSITIVE Sensitive     PIP/TAZO <=4 SENSITIVE Sensitive     Extended ESBL NEGATIVE Sensitive     * >=100,000 COLONIES/mL KLEBSIELLA PNEUMONIAE  SARS CORONAVIRUS 2 (TAT 6-24  HRS) Nasopharyngeal Nasopharyngeal Swab     Status: None   Collection Time: 06/05/19  3:15 PM   Specimen: Nasopharyngeal Swab  Result Value Ref Range Status   SARS Coronavirus 2 NEGATIVE NEGATIVE Final    Comment: (NOTE) SARS-CoV-2 target nucleic acids are NOT DETECTED. The SARS-CoV-2 RNA is generally detectable in upper and lower respiratory specimens during the acute phase of infection. Negative results do not preclude SARS-CoV-2 infection, do not rule out co-infections with other pathogens, and should not be used as the sole basis for treatment or other patient management decisions. Negative results must be combined with clinical observations, patient history, and epidemiological information. The expected result is Negative. Fact Sheet for Patients: SugarRoll.be Fact Sheet for Healthcare Providers: https://www.woods-mathews.com/ This test is not yet approved or cleared by the [place] FDA and  has been authorized for detection and/or diagnosis of SARS-CoV-2 by FDA under an Emergency Use Authorization (EUA). This EUA will remain  in effect (meaning this test can be used) for the duration of the COVID-19 declaration under Section 56 4(b)(1) of the Act, 21 U.S.C. section 360bbb-3(b)(1), unless the authorization is terminated or revoked sooner. Performed at [facility], [place] [address], [place], [place] [address]   SARS CORONAVIRUS 2 (TAT 6-24 HRS) Nasopharyngeal Nasopharyngeal Swab     Status: None   Collection Time: 06/09/19  4:53 PM   Specimen: Nasopharyngeal Swab  Result Value Ref Range Status   SARS Coronavirus 2 NEGATIVE NEGATIVE Final    Comment: (  NOTE) SARS-CoV-2 target nucleic acids are NOT DETECTED. The SARS-CoV-2 RNA is generally detectable in upper and lower respiratory specimens during the acute phase of infection. Negative results do not preclude SARS-CoV-2 infection, do not rule out co-infections with other  pathogens, and should not be used as the sole basis for treatment or other patient management decisions. Negative results must be combined with clinical observations, patient history, and epidemiological information. The expected result is Negative. Fact Sheet for Patients: SugarRoll.be Fact Sheet for Healthcare Providers: https://www.woods-mathews.com/ This test is not yet approved or cleared by the [place] FDA and  has been authorized for detection and/or diagnosis of SARS-CoV-2 by FDA under an Emergency Use Authorization (EUA). This EUA will remain  in effect (meaning this test can be used) for the duration of the COVID-19 declaration under Section 56 4(b)(1) of the Act, 21 U.S.C. section 360bbb-3(b)(1), unless the authorization is terminated or revoked sooner. Performed at [facility], [place] [address], [place], [place] [address]      Studies: DG Chest Port 1 View  Result Date: 06/09/2019 CLINICAL DATA:  Pt unable to answer hx questions. Only moaned when Tech questioned her. Per nurse: Pt from [facility]. Reports difficulty breathing since 1100 this AM. Lethargic and SOB. 100% on RA, wheezing heard. 5 mg or albuterol given en route. EXAM: PORTABLE CHEST 1 VIEW COMPARISON:  Chest radiograph 06/05/2019 FINDINGS: Stable cardiomediastinal contours with enlarged heart size. Left ICD in place. Central pulmonary vascular congestion. Diffuse mild bilateral interstitial opacities likely reflecting trace edema. No new focal consolidation. No pneumothorax or large pleural effusion. No acute finding in the visualized skeleton. IMPRESSION: Cardiomegaly with central pulmonary vascular congestion and mild bilateral interstitial opacities likely reflecting edema. Electronically Signed   By: Audie Pinto M.D.   On: 06/09/2019 15:59    Scheduled Meds: . amiodarone  200 mg Oral BID  . apixaban  2.5 mg Oral BID  . atorvastatin  40 mg Oral  q1800  . carvedilol  3.125 mg Oral BID WC  . ferrous gluconate  324 mg Oral BID  . furosemide  80 mg Intravenous Q12H  . lactulose  20 g Oral BID  . mexiletine  200 mg Oral BID  . pantoprazole  40 mg Oral Daily  . sodium chloride flush  3 mL Intravenous Q12H  . sucralfate  1 g Oral TID WC & HS    Continuous Infusions: . sodium chloride       LOS: 1 day   Time spent: 23 minutes  Belkys Henault Marry Guan, MD [facility] Pager [phone]  If 7PM-7AM, please contact night-coverage [email or web address] Password [facility] 06/10/2019, 8:44 AM

## 2019-06-10 NOTE — Progress Notes (Signed)
Responded to Spiritual Consult for AD notary.  Ms. Tomara asleep when I arrived and difficult to arouse left AD on bedside table.  Spoke with nurse who told me her grandson had already left for the day and another visitor would not be allowed back until tomorrow.  Requested nurse page Chaplain when visitor is present tomorrow so we could explain to her daughter or grandson.  De Burrs Chaplain Resident

## 2019-06-10 NOTE — Progress Notes (Signed)
Initial Nutrition Assessment  INTERVENTION:   -Recommend liberalizing diet given poor prognosis -Ensure Enlive po daily, each supplement provides 350 kcal and 20 grams of protein -Magic cup BID with meals, each supplement provides 290 kcal and 9 grams of protein  NUTRITION DIAGNOSIS:   Increased nutrient needs related to chronic illness(CHF) as evidenced by estimated needs.  GOAL:   Patient will meet greater than or equal to 90% of their needs  MONITOR:   PO intake, Supplement acceptance, Labs, Weight trends, I & O's, Skin  REASON FOR ASSESSMENT:   Consult Assessment of nutrition requirement/status  ASSESSMENT:   73 y.o. female with medical history significant of  CHF/NICM (EF 10 to 15%), VT s/p AICD 2014 and biventricular PPM, paroxysmal A. fib/bradycardia, chronic anemia, CKD-4, HTN, HLD, moderate protein calorie malnutrition and GIB, prolonged QT/torsade and multiple hospitalizations Admitted for acute on chronic combined systolic diastolic CHF exacerbation.  Previous admission: 11/29-12/11  **RD working remotely**  Patient recently discharged from [facility] on 12/11. During that admission pt was not eating well. Pt was provided diet information during that admission as well.  Noted that pt has a palliative care consult given poor prognosis.  As of today, pt ate 10% of breakfast and now reports feeling nauseous. Will order Ensure and Magic Cup supplements for additional kcal and protein.   Per weight records, pt's weight has remained stable.   I/Os: +80 ml since admit UOP: 700 ml x 24 hrs  Labs reviewed. Medications: Fergon tablet, IV Lasix, Carafate, IV Zofran  NUTRITION - FOCUSED PHYSICAL EXAM:  working remotely.  Diet Order:   Diet Order            Diet Heart Room service appropriate? Yes with Assist; Fluid consistency: Thin  Diet effective now              EDUCATION NEEDS:   No education needs have been identified at this time  Skin:  Skin Integrity  Issues:: Stage I, Stage II Stage I: rt buttocks Stage II: lt buttocks  Last BM:  12/14 -type 7  Height:   Ht Readings from Last 1 Encounters:  05/27/19 5' (1.524 m)    Weight:   Wt Readings from Last 1 Encounters:  06/10/19 61.9 kg    Ideal Body Weight:  45.4 kg  BMI:  Body mass index is 26.65 kg/m.  Estimated Nutritional Needs:   Kcal:  1500-1700  Protein:  60-70g  Fluid:  1.5L/day   Clayton Bibles, MS, RD, LDN Inpatient Clinical Dietitian Pager: [phone] After Hours Pager: [phone]

## 2019-06-10 NOTE — Consult Note (Addendum)
Advanced Heart Failure Team Consult Note   Primary Physician: Josetta Huddle, MD PCP-Cardiologist:  Dr. Haroldine Laws   Reason for Consultation: acute on chronic systolic heart failure  HPI:    Stephanie Yates is seen today for evaluation of acute on chronic systolic heart failure at the request of Dr. Earlie Server, Internal Medicine.   Na L Yates a 73 y.o.femalewith a hx of chronic combined CHF/NICM with paroxysmal sustained ventricular tachycardia s/p SJM AICD 2014, PAF, bradycardia requiring adjustment in lower device rate, mitral regurgitation, HTN, HLD, CKD stage IV (baseline 1.7-2.1), chronic anemia, moderate protein-calorie malnutrition, h/o GIBs (ulcers, gastritis, gastric polyp), prior tobacco use and prior remote ETOH abuse(quit 20 y/a).   Primary followed by Dr. Caryl Comes. Was admitted mid 9/39 for a/c systolic HF w/ volume overload. Echo showed further drop in EF from previous baseline of 20-25%, to 15%. RV was normal. She was treated w/ IV diuretics w/ improvement in volume and symptoms. Was transitioned to PO lasix w/ order for PNR metolazone at discharge and enrolled [facility] HF program with REDS monitoring. Also treated w/ GDMT w/ ? blocker, hydralazine + nitrate. No ARB, dig or spiro due to CKD.   She was readmitted late 12/2018 by EP for VT storm. Treated w/ IV amiodarone>>transitioend PO amiodarone + mexiletine.   She has had multiple hospital admissions since July for CHF, most recently admitted 11/29-12/11. RHC showed moderately elevated filling pressures w/ normal cardiac output. She was diuresed w/ IV Lasix and transitioned back to PO. GDMT has been limited by CKD. Unfortunately not a candidate for AHF therapies. Also not a candidate for HD. Palliative care previously recommended.   Pt readmitted again w/ a/c CHF and limited medical options. SCr 2.86 on admit. Poor response to IV diuretics. Only 700 cc out after 80 IV Lasix. Lethargic this AM. Grandson  present at bedside.   Echo 12/2018  EF 15%, mild to mod MR. Normal RV  RHC 05/2019  Findings:  RA = 8 RV = 58/12 PA = 67/13 (41) PCW = 24 Fick cardiac output/index = 6.4/4.0 PVR = 2.6 WU Ao sat = 99% PA sat = 67%, 68% High SVC sat = 69%   Review of Systems: [y] = yes, [ ]  = no   . General: Weight gain [ ] ; Weight loss [ ] ; Anorexia [ ] ; Fatigue [ ] ; Fever [ ] ; Chills [ ] ; Weakness [ ]   . Cardiac: Chest pain/pressure [ ] ; Resting SOB [ ] ; Exertional SOB [ ] ; Orthopnea [ ] ; Pedal Edema [ ] ; Palpitations [ ] ; Syncope [ ] ; Presyncope [ ] ; Paroxysmal nocturnal dyspnea[ ]   . Pulmonary: Cough [ ] ; Wheezing[ ] ; Hemoptysis[ ] ; Sputum [ ] ; Snoring [ ]   . GI: Vomiting[ ] ; Dysphagia[ ] ; Melena[ ] ; Hematochezia [ ] ; Heartburn[ ] ; Abdominal pain [ ] ; Constipation [ ] ; Diarrhea [ ] ; BRBPR [ ]   . GU: Hematuria[ ] ; Dysuria [ ] ; Nocturia[ ]   . Vascular: Pain in legs with walking [ ] ; Pain in feet with lying flat [ ] ; Non-healing sores [ ] ; Stroke [ ] ; TIA [ ] ; Slurred speech [ ] ;  . Neuro: Headaches[ ] ; Vertigo[ ] ; Seizures[ ] ; Paresthesias[ ] ;Blurred vision [ ] ; Diplopia [ ] ; Vision changes [ ]   . Ortho/Skin: Arthritis [ ] ; Joint pain [ ] ; Muscle pain [ ] ; Joint swelling [ ] ; Back Pain [ ] ; Rash [ ]   . Psych: Depression[ ] ; Anxiety[ ]   . Heme: Bleeding problems [ ] ; Clotting disorders [ ] ;  Anemia [ ]   . Endocrine: Diabetes [ ] ; Thyroid dysfunction[ ]   Home Medications Prior to Admission medications   Medication Sig Start Date End Date Taking? Authorizing Provider  acetaminophen (TYLENOL) 500 MG tablet Take 1,000 mg by mouth every 6 (six) hours as needed (pain).    [provider]  albuterol (VENTOLIN HFA) 108 (90 Base) MCG/ACT inhaler Inhale 2 puffs into the lungs every 4 (four) hours as needed for wheezing or shortness of breath.    [provider]  amiodarone (PACERONE) 200 MG tablet Take 1 tablet (200 mg total) by mouth 2 (two) times daily. 05/14/19 06/13/19  Guilford Shi, MD  apixaban (ELIQUIS) 2.5 MG TABS tablet Take 1 tablet (2.5 mg total) by mouth 2 (two) times daily. 03/19/19   Debbe Odea, MD  atorvastatin (LIPITOR) 40 MG tablet Take 1 tablet (40 mg total) by mouth daily at 6 PM. 07/05/18   Deboraha Sprang, MD  carvedilol (COREG) 3.125 MG tablet Take 1 tablet (3.125 mg total) by mouth 2 (two) times daily with a meal. 09/12/18   Deboraha Sprang, MD  Cholecalciferol (VITAMIN D-3) 25 MCG (1000 UT) CAPS Take 1,000 Units by mouth at bedtime.     [provider]  Ensure (ENSURE) Take 237 mLs by mouth daily.    [provider]  ferrous gluconate (FERGON) 324 MG tablet Take 324 mg by mouth 2 (two) times daily.  07/05/18   [provider]  furosemide (LASIX) 80 MG tablet Take 0.5 tablets (40 mg total) by mouth daily. 04/19/19   Nita Sells, MD  lactulose (CEPHULAC) 20 g packet Take 1 packet (20 g total) by mouth 2 (two) times daily. Hold for 3 or more than 3 bowel movements per day. 06/07/19 07/07/19  Darliss Cheney, MD  magnesium oxide (MAGNESIUM-OXIDE) 400 (241.3 Mg) MG tablet Take 1 tablet (400 mg total) by mouth daily. 05/14/19   Guilford Shi, MD  mexiletine (MEXITIL) 200 MG capsule Take 1 capsule (200 mg total) by mouth 2 (two) times daily. 01/24/19   [name], PA-C  nitroGLYCERIN (NITROSTAT) 0.4 MG SL tablet Place 1 tablet (0.4 mg total) under the tongue every 5 (five) minutes as needed for chest pain. 01/12/19   Guilford Shi, MD  ondansetron (ZOFRAN ODT) 4 MG disintegrating tablet Take 1 tablet (4 mg total) by mouth every 8 (eight) hours as needed for nausea or vomiting. 03/21/19   [place] Donnamarie Poag, DO  pantoprazole (PROTONIX) 40 MG tablet Take 1 tablet (40 mg total) by mouth daily. 04/05/19 07/04/19  [place] Donnamarie Poag, DO  polyethylene glycol (MIRALAX / GLYCOLAX) 17 g packet Take 17 g by mouth daily. Can take up to twice a day for constipation. Patient not taking: Reported on 05/27/2019 03/19/19   Debbe Odea, MD    senna-docusate (SENOKOT-S) 8.6-50 MG tablet Take 1 tablet by mouth at bedtime as needed for mild constipation. 05/10/19   Charolotte Capuchin, MD  sucralfate (CARAFATE) 1 GM/10ML suspension Take 10 mLs (1 g total) by mouth 4 (four) times daily -  with meals and at bedtime. 03/19/19   Debbe Odea, MD    Past Medical History: Past Medical History:  Diagnosis Date  . Chronic anemia   . Chronic systolic CHF (congestive heart failure) ([place])   . CKD (chronic kidney disease), stage IV ([place])   . Former tobacco use   . History of tobacco abuse   . Hyperlipidemia   . Hypertension   . Mitral regurgitation   .  Nonischemic cardiomyopathy ([place])    a. EF 10-15% on 11/2012 cath with no significant CAD. b. EF 25% in 2019. b. EF 15% in 12/2018  . PAF (paroxysmal atrial fibrillation) ([place])   . Protein calorie malnutrition ([place])   . Quit consuming alcohol in remote past   . Sinus bradycardia 12/21/2012  . Ventricular tachycardia ([place]) 12/14/2012    Past Surgical History: Past Surgical History:  Procedure Laterality Date  . BIOPSY  01/22/2019   Procedure: BIOPSY;  Surgeon: Wilford Corner, MD;  Location: [place];  Service: Endoscopy;;  . COLONOSCOPY WITH PROPOFOL N/A 03/19/2019   Procedure: COLONOSCOPY WITH PROPOFOL;  Surgeon: Otis Brace, MD;  Location: [place];  Service: Gastroenterology;  Laterality: N/A;  . ESOPHAGOGASTRODUODENOSCOPY (EGD) WITH PROPOFOL N/A 06/18/2017   Procedure: ESOPHAGOGASTRODUODENOSCOPY (EGD) WITH PROPOFOL;  Surgeon: Ronnette Juniper, MD;  Location: [place];  Service: Gastroenterology;  Laterality: N/A;  . ESOPHAGOGASTRODUODENOSCOPY (EGD) WITH PROPOFOL N/A 01/22/2019   Procedure: ESOPHAGOGASTRODUODENOSCOPY (EGD) WITH PROPOFOL;  Surgeon: Wilford Corner, MD;  Location: [place];  Service: Endoscopy;  Laterality: N/A;  . ESOPHAGOGASTRODUODENOSCOPY (EGD) WITH PROPOFOL N/A 03/19/2019   Procedure: ESOPHAGOGASTRODUODENOSCOPY (EGD) WITH PROPOFOL;  Surgeon: Otis Brace, MD;  Location: [facility] ENDOSCOPY;  Service: Gastroenterology;  Laterality: N/A;  . IMPLANTABLE CARDIOVERTER DEFIBRILLATOR IMPLANT  12/19/2012   St. Jude dual-chamber ICD, serial number [id number]  . IMPLANTABLE CARDIOVERTER DEFIBRILLATOR IMPLANT N/A 12/19/2012   Procedure: IMPLANTABLE CARDIOVERTER DEFIBRILLATOR IMPLANT;  Surgeon: Deboraha Sprang, MD;  Location: [facility] CATH LAB;  Service: Cardiovascular;  Laterality: N/A;  . LEFT AND RIGHT HEART CATHETERIZATION WITH CORONARY ANGIOGRAM N/A 12/17/2012   Procedure: LEFT AND RIGHT HEART CATHETERIZATION WITH CORONARY ANGIOGRAM;  Surgeon: Sherren Mocha, MD;  Location: [facility] CATH LAB;  Service: Cardiovascular;  Laterality: N/A;  . POLYPECTOMY  03/19/2019   Procedure: POLYPECTOMY;  Surgeon: Otis Brace, MD;  Location: [place] ENDOSCOPY;  Service: Gastroenterology;;  . RIGHT HEART CATH N/A 05/30/2019   Procedure: RIGHT HEART CATH;  Surgeon: Jolaine Artist, MD;  Location: [place] CV LAB;  Service: Cardiovascular;  Laterality: N/A;  . SCLEROTHERAPY  01/22/2019   Procedure: SCLEROTHERAPY;  Surgeon: Wilford Corner, MD;  Location: [facility] ENDOSCOPY;  Service: Endoscopy;;    Family History: Family History  Problem Relation Age of Onset  . Cancer Mother     Social History: Social History   Socioeconomic History  . Marital status: Widowed    Spouse name: Not on file  . Number of children: Not on file  . Years of education: Not on file  . Highest education level: Not on file  Occupational History  . Not on file  Tobacco Use  . Smoking status: Former Research scientist (life sciences)  . Smokeless tobacco: Never Used  Substance and Sexual Activity  . Alcohol use: No  . Drug use: No  . Sexual activity: Not Currently  Other Topics Concern  . Not on file  Social History Narrative  . Not on file   Social Determinants of Health   Financial Resource Strain:   . Difficulty of Paying Living Expenses: Not on file  Food Insecurity:   . Worried About Charity fundraiser in the Last  Year: Not on file  . Ran Out of Food in the Last Year: Not on file  Transportation Needs:   . Lack of Transportation (Medical): Not on file  . Lack of Transportation (Non-Medical): Not on file  Physical Activity:   . Days of Exercise per Week: Not on file  . Minutes of Exercise per  Session: Not on file  Stress:   . Feeling of Stress : Not on file  Social Connections:   . Frequency of Communication with Friends and Family: Not on file  . Frequency of Social Gatherings with Friends and Family: Not on file  . Attends Religious Services: Not on file  . Active Member of Clubs or Organizations: Not on file  . Attends Archivist Meetings: Not on file  . Marital Status: Not on file    Allergies:  Allergies  Allergen Reactions  . Strawberry Extract Hives, Itching and Swelling         Objective:    Vital Signs:   Temp:  [97.5 F (36.4 C)-98.2 F (36.8 C)] 98.2 F (36.8 C) (12/14 0811) Pulse Rate:  [58-66] 60 (12/14 0824) Resp:  [12-24] 24 (12/14 0622) BP: (90-145)/(45-65) 115/48 (12/14 0824) SpO2:  [100 %] 100 % (12/14 0622) Weight:  [61.9 kg] 61.9 kg (12/14 0622) Last BM Date: 06/10/19  Weight change: Filed Weights   06/10/19 0622  Weight: 61.9 kg    Intake/Output:   Intake/Output Summary (Last 24 hours) at 06/10/2019 1058 Last data filed at 06/10/2019 1035 Gross per 24 hour  Intake 720 ml  Output 700 ml  Net 20 ml      Physical Exam    General:  Chronically ill elderly AAF, lethargic but no resp difficulty HEENT: normal Neck: supple. Elevated JVP . Carotids 2+ bilat; no bruits. No lymphadenopathy or thyromegaly appreciated. Cor: PMI nondisplaced. Regular rate & rhythm. No rubs, gallops or murmurs. Lungs: clear Abdomen: soft, diffuse tenderness w/ palpation, nondistended. No hepatosplenomegaly. No bruits or masses. Good bowel sounds. Extremities: no cyanosis, clubbing, rash, edema Neuro: alert & orientedx3, cranial nerves grossly intact. moves all 4  extremities w/o difficulty. Affect pleasant   Telemetry   Paced rhythm 60s  EKG    A paced 60 bpm.   Labs   Basic Metabolic Panel: Recent Labs  Lab 06/05/19 0243 06/06/19 0405 06/07/19 0153 06/09/19 1541 06/10/19 0432  NA 139 139 139 138 141  K 3.8 4.4 3.4* 4.9 4.2  CL 102 104 105 105 106  CO2 25 23 25 25 24   GLUCOSE 94 91 104* 158* 101*  BUN 32* 33* 33* 39* 39*  CREATININE 2.31* 2.32* 2.39* 2.86* 2.52*  CALCIUM 8.4* 8.2* 8.2* 8.4* 8.1*  MG  --   --   --   --  2.0  PHOS  --   --   --   --  4.0    Liver Function Tests: Recent Labs  Lab 06/04/19 0334 06/05/19 0243 06/06/19 0405 06/07/19 0153 06/10/19 0432  AST 114* 111* 99* 82* 76*  ALT 42 45* 40 36 35  ALKPHOS 132* 129* 120 110 104  BILITOT 1.3* 1.5* 1.0 1.5* 1.4*  PROT 6.2* 6.5 6.2* 6.0* 6.2*  ALBUMIN 1.7* 1.7* 1.8* 1.7* 1.9*   No results for input(s): LIPASE, AMYLASE in the last 168 hours. Recent Labs  Lab 06/04/19 0642 06/05/19 0444 06/09/19 1935  AMMONIA 124* 33 44*    CBC: Recent Labs  Lab 06/05/19 0243 06/06/19 0405 06/06/19 1129 06/07/19 0153 06/09/19 1541 06/10/19 0432  WBC 16.3* 17.7* 24.2* 16.5* 18.1* 13.5*  NEUTROABS 12.5* 13.7* 19.1* 13.0* 15.3*  --   HGB 8.1* 7.1* 8.3* 7.3* 8.4* 7.2*  HCT 24.7* 21.3* 25.2* 22.2* 26.1* 22.1*  MCV 78.9* 77.5* 77.5* 77.9* 78.9* 79.2*  PLT 235 209 381 218 284 214    Cardiac Enzymes: No results for  input(s): CKTOTAL, CKMB, CKMBINDEX, TROPONINI in the last 168 hours.  BNP: BNP (last 3 results) Recent Labs    05/23/19 0057 05/26/19 1822 06/09/19 1541  BNP 1,286.3* 1,587.2* 2,039.7*    ProBNP (last 3 results) No results for input(s): PROBNP in the last 8760 hours.   CBG: Recent Labs  Lab 06/06/19 1124 06/06/19 1545 06/06/19 2056 06/07/19 0731 06/07/19 1107  GLUCAP 106* 100* 96 93 94    Coagulation Studies: No results for input(s): LABPROT, INR in the last 72 hours.   Imaging   DG Chest Port 1 View  Result Date:  06/09/2019 CLINICAL DATA:  Pt unable to answer hx questions. Only moaned when Tech questioned her. Per nurse: Pt from [facility]. Reports difficulty breathing since 1100 this AM. Lethargic and SOB. 100% on RA, wheezing heard. 5 mg or albuterol given en route. EXAM: PORTABLE CHEST 1 VIEW COMPARISON:  Chest radiograph 06/05/2019 FINDINGS: Stable cardiomediastinal contours with enlarged heart size. Left ICD in place. Central pulmonary vascular congestion. Diffuse mild bilateral interstitial opacities likely reflecting trace edema. No new focal consolidation. No pneumothorax or large pleural effusion. No acute finding in the visualized skeleton. IMPRESSION: Cardiomegaly with central pulmonary vascular congestion and mild bilateral interstitial opacities likely reflecting edema. Electronically Signed   By: Audie Pinto M.D.   On: 06/09/2019 15:59      Medications:     Current Medications: . amiodarone  200 mg Oral BID  . apixaban  2.5 mg Oral BID  . atorvastatin  40 mg Oral q1800  . carvedilol  3.125 mg Oral BID WC  . ferrous gluconate  324 mg Oral BID  . furosemide  80 mg Intravenous Q12H  . lactulose  20 g Oral BID  . mexiletine  200 mg Oral BID  . pantoprazole  40 mg Oral Daily  . sodium chloride flush  3 mL Intravenous Q12H  . sucralfate  1 g Oral TID WC & HS     Infusions: . sodium chloride         Patient Profile   Stephanie Yates a 66 y.o.femalewith a hx of chronic combined CHF/NICM EF 15% with paroxysmal sustained ventricular tachycardia s/p SJM AICD 2014, PAF, bradycardia requiring adjustment in lower device rate, mitral regurgitation, HTN, HLD, CKD stage IV (baseline 1.7-2.1), chronic anemia, moderate protein-calorie malnutrition, h/o GIBs (ulcers, gastritis, gastric polyp), prior tobacco use and prior remote ETOH abuse(quit 20 y/a), multiple hospital admissions over the last 6 months and failure to thrive readmitted w/ acute on chronic systolic CHF.    Assessment/Plan   1. Stage D HF, NYHA Class IV: NICM. Most recent echo 7/20 showed further reduction in EF, down from previous baseline of 20-25% to 15%. GDMT limited by CKD. Not felt to be a candidate for advanced therapies (I.e VAD) given debility and renal failure. Poor candidate for HD. Also with h/o recurrent VT, not a candidate for home inotrope's.  - at this point, little left to offer from a HF standpoint. With end stage HF c/b by CKD and significant debility, recommend palliative care/ Hospice. Family present at beside and aware of her poor prognosis and accepting of palliative care. Will cancel 2D echo that was ordered by primary team, as findings will not change plan of care.     Length of Stay: 1  Lyda Jester, PA-C  06/10/2019, 10:58 AM  Advanced Heart Failure Team Pager [phone] (M-F; 7a - 4p)  Please contact [facility] for night-coverage after hours (4p -7a ) and  weekends on [email or web address]  Patient seen and examined with the above-signed Advanced Practice Provider and/or Housestaff. I personally reviewed laboratory data, imaging studies and relevant notes. I independently examined the patient and formulated the important aspects of the plan. I have edited the note to reflect any of my changes or salient points. I have personally discussed the plan with the patient and/or family.  Stephanie Yates is a 17 y/o woman with end-stage systolic HF due to a NICM with LVEF 15, h/o VT, PAF, h/o GIB CKD 4 and debility. Recently admitted with recurrent HF and ab pain. Diuresed well and underwent RHC which showed relatively well preserved hemodynamics.   Now admitted with recurrent HF and RUQ pain. Cr 2.5.   On exam she is elderly and frail.  HEENT: normal Neck: supple. JVP to jaw. Carotids 2+ bilat; no bruits. No lymphadenopathy or thryomegaly appreciated. Cor: PMI nondisplaced. Regular rate & rhythm. +s3. Lungs: clear Abdomen: soft, +tender RUQ, nondistended. No  hepatosplenomegaly. No bruits or masses. Good bowel sounds. Extremities: no cyanosis, clubbing, rash, tr edema Neuro: alert & orientedx3, cranial nerves grossly intact. moves all 4 extremities w/o difficulty. Affect pleasant  Given her fraility and CKD4, Stephanie Yates is unfortunately not a canddiate for advanced HF therapies. Would recommend IV diuresis and Hospice Consult. We have had this discussion with her several times in the past and she continues to remain resistant to Palliative Care options. We will re-address. Suspect RUQ pain related to liver capsule distension from HF.   Glori Bickers, MD  10:13 PM

## 2019-06-10 NOTE — NC FL2 (Signed)
[place] LEVEL OF CARE SCREENING TOOL     IDENTIFICATION  Patient Name: SHAHLA Yates Birthdate: 30-Aug-1945 Sex: female Admission Date (Current Location): 06/09/2019  [facility] and [place] Number:  Herbalist and Address:  [facility] [place], [place] [address] [place], [place] [address]      Provider Number: [id number]  Attending Physician Name and Address:  Hollice Gong, [place] Name and Phone Number:       Current Level of Care: Hospital Recommended Level of Care: [place] Prior Approval Number:    Date Approved/Denied:   PASRR Number:    Discharge Plan: SNF    Current Diagnoses: Patient Active Problem List   Diagnosis Date Noted  . Hypoalbuminemia 06/09/2019  . Leukocytosis 06/09/2019  . Elevated troponin 06/09/2019  . Goals of care, counseling/discussion   . Pressure injury of skin 05/29/2019  . Acute on chronic systolic CHF (congestive heart failure) ([place]) 05/27/2019  . Torsades de pointes ([place]) 05/14/2019  . Weakness generalized   . Palliative care by specialist   . Gallbladder mass 05/06/2019  . Volume overload 05/06/2019  . Vertigo 04/15/2019  . Abnormal liver function 04/15/2019  . Right adrenal mass ([place]) possible adenoma 03/18/2019  . Nephrolithiasis 03/18/2019  . Prolonged QT interval 03/17/2019  . Renal failure (ARF), acute on chronic ([place]) 03/17/2019  . Nausea and vomiting 03/17/2019  . Epigastric abdominal pain 01/29/2019  . Gastritis 01/28/2019  . CKD (chronic kidney disease) stage 4, GFR 15-29 ml/min (HCC) 01/28/2019  . NSVT (nonsustained ventricular tachycardia) ([place]) 01/17/2019  . AKI (acute kidney injury) ([place]) 01/12/2019  . DNR (do not resuscitate) discussion   . CHF exacerbation ([place]) 01/06/2019  . Influenza A 09/02/2018  . Lactic acidosis 09/02/2018  . Chronic combined systolic and diastolic congestive heart failure ([place]) 09/02/2018  . Rapid atrial fibrillation  ([place]) 06/08/2018  . Acute on chronic combined systolic and diastolic heart failure ([place]) 06/07/2018  . Anemia 06/07/2018  . Acute GI bleeding 06/15/2017  . Chronic anticoagulation   . Acute on chronic congestive heart failure ([place])   . HLD (hyperlipidemia) 03/05/2017  . CKD (chronic kidney disease), stage III 03/05/2017  . Chest pain 03/05/2017  . Symptomatic anemia 03/05/2017  . PAF (paroxysmal atrial fibrillation) ([place]) 04/01/2013  . ICD (implantable cardioverter-defibrillator), dual, [place] 04/01/2013  . PVC's (premature ventricular contractions) 04/01/2013  . Hypokalemia 12/21/2012  . History of tobacco abuse 12/21/2012  . Transaminitis 12/21/2012  . Sinus bradycardia 12/21/2012  . Hyperkalemia 12/20/2012  . Nonischemic cardiomyopathy ([place]) 12/20/2012  . UTI (urinary tract infection) 12/20/2012  . Acute on chronic combined systolic and diastolic CHF (congestive heart failure) ([place]) 12/20/2012  . Ventricular tachycardia ([place]) 12/14/2012  . Hypertension 12/14/2012    Orientation RESPIRATION BLADDER Height & Weight     Self, Time, Situation, Place  O2(nasal cannula 2L) External catheter, Incontinent(placed 12/14) Weight: 136 lb 7.4 oz (61.9 kg) Height:     BEHAVIORAL SYMPTOMS/MOOD NEUROLOGICAL BOWEL NUTRITION STATUS      Incontinent Diet(heart healthy, thin liquids)  AMBULATORY STATUS COMMUNICATION OF NEEDS Skin   Extensive Assist Verbally   PU Stage 1 Dressing: (buttocks, foam dressing, change PRN) PU Stage 2 Dressing: (buttocks, foam, change every 3 days)                   Personal Care Assistance Level of Assistance    Bathing Assistance: Maximum assistance Feeding assistance: Limited assistance Dressing Assistance: Maximum assistance  Functional Limitations Info  Sight, Hearing, Speech Sight Info: Adequate Hearing Info: Adequate Speech Info: Adequate    SPECIAL CARE FACTORS FREQUENCY  PT (By licensed PT), OT (By licensed OT)     PT Frequency: 5x OT  Frequency: 5x            Contractures Contractures Info: Not present    Additional Factors Info  Code Status, Allergies Code Status Info: DNR Allergies Info: Strawberry extract           Current Medications (06/10/2019):  This is the current hospital active medication list Current Facility-Administered Medications  Medication Dose Route Frequency Provider Last Rate Last Admin  . 0.9 %  sodium chloride infusion  250 mL Intravenous PRN Toy Baker, MD      . acetaminophen (TYLENOL) tablet 650 mg  650 mg Oral Q6H PRN Doutova, Anastassia, MD       Or  . acetaminophen (TYLENOL) suppository 650 mg  650 mg Rectal Q6H PRN Doutova, Anastassia, MD      . albuterol (PROVENTIL) (2.5 MG/3ML) 0.083% nebulizer solution 2.5 mg  2.5 mg Nebulization Q4H PRN Doutova, Anastassia, MD      . amiodarone (PACERONE) tablet 200 mg  200 mg Oral BID Toy Baker, MD   200 mg at 06/10/19 0823  . apixaban (ELIQUIS) tablet 2.5 mg  2.5 mg Oral BID Toy Baker, MD   2.5 mg at 06/10/19 0824  . atorvastatin (LIPITOR) tablet 40 mg  40 mg Oral q1800 Doutova, Anastassia, MD      . carvedilol (COREG) tablet 3.125 mg  3.125 mg Oral BID WC Doutova, Anastassia, MD   3.125 mg at 06/10/19 0824  . ferrous gluconate (FERGON) tablet 324 mg  324 mg Oral BID Toy Baker, MD   324 mg at 06/10/19 0302  . furosemide (LASIX) injection 80 mg  80 mg Intravenous Q12H Toy Baker, MD   80 mg at 06/10/19 0823  . HYDROcodone-acetaminophen (NORCO/VICODIN) 5-325 MG per tablet 1-2 tablet  1-2 tablet Oral Q4H PRN Doutova, Anastassia, MD      . lactulose (CHRONULAC) 10 GM/15ML solution 20 g  20 g Oral BID Toy Baker, MD   20 g at 06/10/19 3267  . mexiletine (MEXITIL) capsule 200 mg  200 mg Oral BID Toy Baker, MD   200 mg at 06/10/19 0303  . ondansetron (ZOFRAN) tablet 4 mg  4 mg Oral Q6H PRN Doutova, Anastassia, MD       Or  . ondansetron (ZOFRAN) injection 4 mg  4 mg Intravenous Q6H  PRN Doutova, Anastassia, MD      . pantoprazole (PROTONIX) EC tablet 40 mg  40 mg Oral Daily Doutova, Anastassia, MD   40 mg at 06/10/19 0824  . sodium chloride flush (NS) 0.9 % injection 3 mL  3 mL Intravenous Q12H Doutova, Anastassia, MD   3 mL at 06/10/19 1255  . sodium chloride flush (NS) 0.9 % injection 3 mL  3 mL Intravenous PRN Doutova, Anastassia, MD      . sucralfate (CARAFATE) 1 GM/10ML suspension 1 g  1 g Oral TID WC & HS Doutova, Anastassia, MD   1 g at 06/10/19 1245     Discharge Medications: Please see discharge summary for a list of discharge medications.  Relevant Imaging Results:  Relevant Lab Results:   Additional Information [id number]  Gerrianne Scale Jonasia Coiner, LCSW

## 2019-06-11 LAB — URINE CULTURE: Culture: <10000 — AB

## 2019-06-11 LAB — BASIC METABOLIC PANEL
Anion gap: 9 (ref 5–15)
BUN: 35 mg/dL — ABNORMAL HIGH (ref 8–23)
CO2: 25 mmol/L (ref 22–32)
Calcium: 8.2 mg/dL — ABNORMAL LOW (ref 8.9–10.3)
Chloride: 105 mmol/L (ref 98–111)
Creatinine, Ser: 2.19 mg/dL — ABNORMAL HIGH (ref 0.44–1.00)
GFR calc Af Amer: 25 mL/min — ABNORMAL LOW (ref >60)
GFR calc non Af Amer: 21 mL/min — ABNORMAL LOW (ref >60)
Glucose, Bld: 140 mg/dL — ABNORMAL HIGH (ref 70–99)
Potassium: 3.6 mmol/L (ref 3.5–5.1)
Sodium: 139 mmol/L (ref 135–145)

## 2019-06-11 LAB — CBC
HCT: 24 % — ABNORMAL LOW (ref 36.0–46.0)
Hemoglobin: 7.9 g/dL — ABNORMAL LOW (ref 12.0–15.0)
MCH: 25.6 pg — ABNORMAL LOW (ref 26.0–34.0)
MCHC: 32.9 g/dL (ref 30.0–36.0)
MCV: 77.7 fL — ABNORMAL LOW (ref 80.0–100.0)
Platelets: 246 10*3/uL (ref 150–400)
RBC: 3.09 MIL/uL — ABNORMAL LOW (ref 3.87–5.11)
RDW: 16.4 % — ABNORMAL HIGH (ref 11.5–15.5)
WBC: 15 10*3/uL — ABNORMAL HIGH (ref 4.0–10.5)
nRBC: 0 % (ref 0.0–0.2)

## 2019-06-11 NOTE — Progress Notes (Signed)
   Daily Progress Note   Patient Name: Stephanie Yates       Date: 06/11/2019 DOB: Mar 20, 1946  Age: 73 y.o. MRN#: [id number] Attending Physician: Ikramullah, [place] Siding Physician: Josetta Huddle, MD Admit Date: 06/09/2019  Progress Note from the Palliative Medicine Team at [facility] seen at chairside, she reports feeling "better" today. States that her abdominal pain has improved since yesterday.   Ask if we could continue conversation from yesterday now that she felt improved. We discussed her code status as per discussions with her daughter, Magda Paganini on the prior day. Endorsed that I wanted to verify everything decided for her is in fact what she would want given that she able to speak on her own behalf. The patient stated that she agreed as she would not want chest compressions or intubation. She would want antibiotics and IVF is needed, but not intense aggressive measures. She said she would absolutely not want a feeding tube under any circumstances.   We completed a MOST form designating these wishes in writing.   Contacts/Participants in Discussion: Primary Decision Maker: [name]  Goals of Care/Code Status/Advance Care Planning:   Code Status: DNAR/DNI  Artificial feeding: No  Antibiotics: Yes  Diagnostics: Yes  Rehospitalization: At the present time, YES  Symptom Management:  ? Bowel regiment:  Would hold off on bowel regiment given patients persistent loose stools  If worsens would consider ordering C.Diff and stool panel ? Abdominal Pain, recently had CT abd/pelvis, KUB, [place], CI consult origin is unknown:  Tylenol 650mg  PO Q6H PRN  Norco 1-2 tabs PO Q4H ? Dyspnea:  Supplemental O2  Duonebs Q4H  Lasix per primary team ? Nausea:  Zofran Q6H PRN  Dispo: Home with HH, grandson, Marya Amsler will move in to provide daily assitance  Time Spend with patient: 35 minutes to review the intricacies of MOST form and review patients  wishes Greater than 50%  of this time was spent counseling and coordinating care related to the above assessment and plan.  Rosezella Rumpf, NP  06/11/2019, 11:19 AM  Please contact Palliative Medicine Team phone at [phone] for questions and concerns.   Wadie Lessen NP agrees with above assessment and plan

## 2019-06-11 NOTE — Progress Notes (Signed)
PROGRESS NOTE  Stephanie Yates ENI:[id number] DOB: April 22, 1946 DOA: 06/09/2019 PCP: Josetta Huddle, MD  Hospital Course/Subjective: 73 y.o. female with medical history significant of CHF/NICM (EF 10 to 15%), VT s/p AICD 2014 and biventricular PPM, paroxysmal A. fib/bradycardia, chronic anemia, CKD-4, HTN, HLD, moderate protein calorie malnutrition and GIB, prolonged QT/torsade and multiple hospitalizations Admitted for acute on chronic combined systolic diastolic CHF exacerbation. Has been seen by HF team and had conversation with Palliative care along with her family yesterday afternoon.  This AM, she is sitting up in chair comfortably, saturating 100% on supplemental oxygen and has no complaints. She seems more awake and alert today, and we discussed her understanding of her heart failure. She says "I don't know much but at some point my heart is just going to fail."  Assessment/Plan: Acute on chronic combined systolic and diastolic CHF (congestive heart failure) ([place]) -  - admit on telemetry,  cycle cardiac enzymes,   Trop 31-28 obtain serial ECG  to evaluate for ischemia as a cause of heart failure  Last BNP BNP (last 3 results) Recent Labs (within last 365 days)       Recent Labs    05/23/19 0057 05/26/19 1822 06/09/19 1541  BNP 1,286.3* 1,587.2* 2,039.7*      diurese with IV lasix and monitor orthostatics and creatinine to avoid over diuresis, AM labs pending   ACE/ARBi   Contraindicated due to CKD cardiology consulted, following, recommend continue IV diuresis but encourage palliative care/hospice discussions as this is end stage HF   . PAF (paroxysmal atrial fibrillation) (HCC) -           - CHA2DS2 vas score >5 : continue current anticoagulation with   Eliquis,           -  Rate control:  Currently controlled with Coreg will continue        - Rhythm control:  Continue amiodarone  . ICD (implantable cardioverter-defibrillator), dual, st Judes - sp pacemaker  .  HLD (hyperlipidemia) - stable continue home meds  . CKD (chronic kidney disease), stage III -chronic stable avoid nephrotoxic medications  . Symptomatic anemia -persistent anemia have had GI work-up in the past currently stable continue to monitor, for now plan is iron and intermittent transfusion as needed  . Hypoalbuminemia - administered IV albumin as this may help with diuresis  . Leukocytosis - chronic stable  . Elevated troponin - chronically elevated stable  . Hypertension - chronic stable   Other plan as per orders.  DVT prophylaxis:  eliquis   Code Status: DNR after palliative discussion 12/14   Family Communication:   Family not at  Bedside   Disposition Plan:                            Back to current facility when stable                                             Would benefit from PT/OT eval prior to DC  Ordered                                     Social Work  consulted  Nutrition    consulted                                   Palliative care    consulted  Objective: Vitals:   06/10/19 1729 06/10/19 1941 06/10/19 2330 06/11/19 0411  BP: (!) 123/56   125/61  Pulse: 60   60  Resp:      Temp:  97.8 F (36.6 C) 97.6 F (36.4 C) 98.3 F (36.8 C)  TempSrc:  Oral  Oral  SpO2:    100%  Weight:    59.7 kg    Intake/Output Summary (Last 24 hours) at 06/11/2019 0901 Last data filed at 06/11/2019 0410 Gross per 24 hour  Intake 423 ml  Output 1050 ml  Net -627 ml   Filed Weights   06/10/19 0622 06/11/19 0411  Weight: 61.9 kg 59.7 kg     Exam: General:  Alert, oriented to self, calm, in no acute distress, on O2 Eyes: EOMI, clear sclerea Neck: supple, no masses, trachea mildline  Cardiovascular: RRR, no murmurs or rubs, no peripheral edema  Respiratory: clear to auscultation bilaterally but diminished on the right, bibasilar crackles Abdomen: soft, nontender, nondistended, normal bowel tones heard  Skin: dry, no rashes    Musculoskeletal: no joint effusions, normal range of motion  Psychiatric: appropriate affect, normal speech  Neurologic: extraocular muscles intact, clear speech, moving all extremities with intact sensorium    Data Reviewed: CBC: Recent Labs  Lab 06/05/19 0243 06/06/19 0405 06/06/19 1129 06/07/19 0153 06/09/19 1541 06/10/19 0432  WBC 16.3* 17.7* 24.2* 16.5* 18.1* 13.5*  NEUTROABS 12.5* 13.7* 19.1* 13.0* 15.3*  --   HGB 8.1* 7.1* 8.3* 7.3* 8.4* 7.2*  HCT 24.7* 21.3* 25.2* 22.2* 26.1* 22.1*  MCV 78.9* 77.5* 77.5* 77.9* 78.9* 79.2*  PLT 235 209 381 218 284 355   Basic Metabolic Panel: Recent Labs  Lab 06/05/19 0243 06/06/19 0405 06/07/19 0153 06/09/19 1541 06/10/19 0432  NA 139 139 139 138 141  K 3.8 4.4 3.4* 4.9 4.2  CL 102 104 105 105 106  CO2 25 23 25 25 24   GLUCOSE 94 91 104* 158* 101*  BUN 32* 33* 33* 39* 39*  CREATININE 2.31* 2.32* 2.39* 2.86* 2.52*  CALCIUM 8.4* 8.2* 8.2* 8.4* 8.1*  MG  --   --   --   --  2.0  PHOS  --   --   --   --  4.0   GFR: Estimated Creatinine Clearance: 15.6 mL/min (A) (by C-G formula based on SCr of 2.52 mg/dL (H)). Liver Function Tests: Recent Labs  Lab 06/05/19 0243 06/06/19 0405 06/07/19 0153 06/10/19 0432  AST 111* 99* 82* 76*  ALT 45* 40 36 35  ALKPHOS 129* 120 110 104  BILITOT 1.5* 1.0 1.5* 1.4*  PROT 6.5 6.2* 6.0* 6.2*  ALBUMIN 1.7* 1.8* 1.7* 1.9*   No results for input(s): LIPASE, AMYLASE in the last 168 hours. Recent Labs  Lab 06/05/19 0444 06/09/19 1935  AMMONIA 33 44*   Coagulation Profile: No results for input(s): INR, PROTIME in the last 168 hours. Cardiac Enzymes: No results for input(s): CKTOTAL, CKMB, CKMBINDEX, TROPONINI in the last 168 hours. BNP (last 3 results) No results for input(s): PROBNP in the last 8760 hours. HbA1C: No results for input(s): HGBA1C in the last 72 hours. CBG: Recent Labs  Lab 06/06/19 1124 06/06/19 1545 06/06/19 2056 06/07/19 0731 06/07/19 1107  GLUCAP 106* 100*  96  93 94   Lipid Profile: No results for input(s): CHOL, HDL, LDLCALC, TRIG, CHOLHDL, LDLDIRECT in the last 72 hours. Thyroid Function Tests: Recent Labs    06/10/19 0432  TSH 1.957   Anemia Panel: Recent Labs    06/10/19 0432  VITAMINB12 1,023*  FOLATE 15.2  FERRITIN 26  TIBC 223*  IRON 18*  RETICCTPCT 1.8   Urine analysis:    Component Value Date/Time   COLORURINE YELLOW 06/09/2019 2006   APPEARANCEUR CLEAR 06/09/2019 2006   LABSPEC 1.013 06/09/2019 2006   PHURINE 5.0 06/09/2019 2006   GLUCOSEU NEGATIVE 06/09/2019 2006   HGBUR SMALL (A) 06/09/2019 2006   BILIRUBINUR NEGATIVE 06/09/2019 2006   [place] 06/09/2019 2006   PROTEINUR NEGATIVE 06/09/2019 2006   UROBILINOGEN 1.0 08/29/2014 2035   NITRITE NEGATIVE 06/09/2019 2006   LEUKOCYTESUR NEGATIVE 06/09/2019 2006   Sepsis Labs: @LABRCNTIP (procalcitonin:4,lacticidven:4)  ) Recent Results (from the past 240 hour(s))  Culture, Urine     Status: Abnormal   Collection Time: 06/01/19  7:41 PM   Specimen: Urine, Clean Catch  Result Value Ref Range Status   Specimen Description URINE, CLEAN CATCH  Final   Special Requests   Final    NONE Performed at [facility], [place] [address], [place], [place] [address]    Culture >=100,000 COLONIES/mL KLEBSIELLA PNEUMONIAE (A)  Final   Report Status 06/04/2019 FINAL  Final   Organism ID, Bacteria KLEBSIELLA PNEUMONIAE (A)  Final      Susceptibility   Klebsiella pneumoniae - MIC*    AMPICILLIN RESISTANT Resistant     CEFAZOLIN <=4 SENSITIVE Sensitive     CEFTRIAXONE <=1 SENSITIVE Sensitive     CIPROFLOXACIN <=0.25 SENSITIVE Sensitive     GENTAMICIN <=1 SENSITIVE Sensitive     IMIPENEM <=0.25 SENSITIVE Sensitive     NITROFURANTOIN <=16 SENSITIVE Sensitive     TRIMETH/SULFA <=20 SENSITIVE Sensitive     AMPICILLIN/SULBACTAM <=2 SENSITIVE Sensitive     PIP/TAZO <=4 SENSITIVE Sensitive     Extended ESBL NEGATIVE Sensitive     * >=100,000 COLONIES/mL KLEBSIELLA  PNEUMONIAE  SARS CORONAVIRUS 2 (TAT 6-24 HRS) Nasopharyngeal Nasopharyngeal Swab     Status: None   Collection Time: 06/05/19  3:15 PM   Specimen: Nasopharyngeal Swab  Result Value Ref Range Status   SARS Coronavirus 2 NEGATIVE NEGATIVE Final    Comment: (NOTE) SARS-CoV-2 target nucleic acids are NOT DETECTED. The SARS-CoV-2 RNA is generally detectable in upper and lower respiratory specimens during the acute phase of infection. Negative results do not preclude SARS-CoV-2 infection, do not rule out co-infections with other pathogens, and should not be used as the sole basis for treatment or other patient management decisions. Negative results must be combined with clinical observations, patient history, and epidemiological information. The expected result is Negative. Fact Sheet for Patients: SugarRoll.be Fact Sheet for Healthcare Providers: https://www.woods-mathews.com/ This test is not yet approved or cleared by the [place] FDA and  has been authorized for detection and/or diagnosis of SARS-CoV-2 by FDA under an Emergency Use Authorization (EUA). This EUA will remain  in effect (meaning this test can be used) for the duration of the COVID-19 declaration under Section 56 4(b)(1) of the Act, 21 U.S.C. section 360bbb-3(b)(1), unless the authorization is terminated or revoked sooner. Performed at [facility], [place] [address] [place], [place] [address]   SARS CORONAVIRUS 2 (TAT 6-24 HRS) Nasopharyngeal Nasopharyngeal Swab     Status: None   Collection Time: 06/09/19  4:53 PM   Specimen: Nasopharyngeal Swab  Result Value Ref Range Status   SARS Coronavirus 2 NEGATIVE NEGATIVE Final    Comment: (NOTE) SARS-CoV-2 target nucleic acids are NOT DETECTED. The SARS-CoV-2 RNA is generally detectable in upper and lower respiratory specimens during the acute phase of infection. Negative results do not preclude SARS-CoV-2 infection, do not  rule out co-infections with other pathogens, and should not be used as the sole basis for treatment or other patient management decisions. Negative results must be combined with clinical observations, patient history, and epidemiological information. The expected result is Negative. Fact Sheet for Patients: SugarRoll.be Fact Sheet for Healthcare Providers: https://www.woods-mathews.com/ This test is not yet approved or cleared by the [place] FDA and  has been authorized for detection and/or diagnosis of SARS-CoV-2 by FDA under an Emergency Use Authorization (EUA). This EUA will remain  in effect (meaning this test can be used) for the duration of the COVID-19 declaration under Section 56 4(b)(1) of the Act, 21 U.S.C. section 360bbb-3(b)(1), unless the authorization is terminated or revoked sooner. Performed at [facility], [place] [address], [place], [place] [address]      Studies: No results found.  Scheduled Meds: . amiodarone  200 mg Oral BID  . apixaban  2.5 mg Oral BID  . atorvastatin  40 mg Oral q1800  . carvedilol  3.125 mg Oral BID WC  . feeding supplement (ENSURE ENLIVE)  237 mL Oral Q24H  . ferrous gluconate  324 mg Oral BID  . furosemide  80 mg Intravenous Q12H  . lactulose  20 g Oral BID  . mexiletine  200 mg Oral BID  . pantoprazole  40 mg Oral Daily  . sodium chloride flush  3 mL Intravenous Q12H  . sucralfate  1 g Oral TID WC & HS    Continuous Infusions: . sodium chloride       LOS: 2 days   Time spent: 23 minutes  Kuzey Ogata Marry Guan, MD [facility] Pager [phone]  If 7PM-7AM, please contact night-coverage [email or web address] Password [email or web address] 06/11/2019, 9:01 AM

## 2019-06-11 NOTE — Progress Notes (Signed)
Advanced Heart Failure Rounding Note   Subjective:    Dyspnea improve today but still very weak. Ab pain resolved. On IV lasix. Weight down 5 pounds.   No orthopnea or PND. Creatinine 2.5 -> 2.19  Has met with Hospice. Now DNR/DNI but not comfortable with Hospice at this point.   Objective:   Weight Range:  Vital Signs:   Temp:  [97.6 F (36.4 C)-98.3 F (36.8 C)] 98.3 F (36.8 C) (12/15 0411) Pulse Rate:  [60-62] 60 (12/15 0411) BP: (123-125)/(56-61) 125/61 (12/15 0411) SpO2:  [100 %] 100 % (12/15 0411) Weight:  [59.7 kg] 59.7 kg (12/15 0411) Last BM Date: 06/10/19  Weight change: Filed Weights   06/10/19 0622 06/11/19 0411  Weight: 61.9 kg 59.7 kg    Intake/Output:   Intake/Output Summary (Last 24 hours) at 06/11/2019 1417 Last data filed at 06/11/2019 0410 Gross per 24 hour  Intake 363 ml  Output 350 ml  Net 13 ml     Physical Exam: General:  Elderly. Frail appearing. No resp difficulty HEENT: normal Neck: supple. JVP to jaw . Carotids 2+ bilat; no bruits. No lymphadenopathy or thryomegaly appreciated. Cor: PMI nondisplaced. Regular rate & rhythm. No rubs, gallops or murmurs. Lungs: clear Abdomen: soft, nontender, nondistended. No hepatosplenomegaly. No bruits or masses. Good bowel sounds. Extremities: no cyanosis, clubbing, rash, tr edema Neuro: alert & orientedx3, cranial nerves grossly intact. moves all 4 extremities w/o difficulty. Affect pleasant  Telemetry: A-paced 60 Personally reviewed   Labs: Basic Metabolic Panel: Recent Labs  Lab 06/06/19 0405 06/07/19 0153 06/09/19 1541 06/10/19 0432 06/11/19 0937  NA 139 139 138 141 139  K 4.4 3.4* 4.9 4.2 3.6  CL 104 105 105 106 105  CO2 23 25 25 24 25   GLUCOSE 91 104* 158* 101* 140*  BUN 33* 33* 39* 39* 35*  CREATININE 2.32* 2.39* 2.86* 2.52* 2.19*  CALCIUM 8.2* 8.2* 8.4* 8.1* 8.2*  MG  --   --   --  2.0  --   PHOS  --   --   --  4.0  --     Liver Function Tests: Recent Labs  Lab  06/05/19 0243 06/06/19 0405 06/07/19 0153 06/10/19 0432  AST 111* 99* 82* 76*  ALT 45* 40 36 35  ALKPHOS 129* 120 110 104  BILITOT 1.5* 1.0 1.5* 1.4*  PROT 6.5 6.2* 6.0* 6.2*  ALBUMIN 1.7* 1.8* 1.7* 1.9*   No results for input(s): LIPASE, AMYLASE in the last 168 hours. Recent Labs  Lab 06/05/19 0444 06/09/19 1935  AMMONIA 33 44*    CBC: Recent Labs  Lab 06/05/19 0243 06/06/19 0405 06/06/19 1129 06/07/19 0153 06/09/19 1541 06/10/19 0432 06/11/19 0937  WBC 16.3* 17.7* 24.2* 16.5* 18.1* 13.5* 15.0*  NEUTROABS 12.5* 13.7* 19.1* 13.0* 15.3*  --   --   HGB 8.1* 7.1* 8.3* 7.3* 8.4* 7.2* 7.9*  HCT 24.7* 21.3* 25.2* 22.2* 26.1* 22.1* 24.0*  MCV 78.9* 77.5* 77.5* 77.9* 78.9* 79.2* 77.7*  PLT 235 209 381 218 284 214 246    Cardiac Enzymes: No results for input(s): CKTOTAL, CKMB, CKMBINDEX, TROPONINI in the last 168 hours.  BNP: BNP (last 3 results) Recent Labs    05/23/19 0057 05/26/19 1822 06/09/19 1541  BNP 1,286.3* 1,587.2* 2,039.7*    ProBNP (last 3 results) No results for input(s): PROBNP in the last 8760 hours.    Other results:  Imaging: DG Chest Port 1 View  Result Date: 06/09/2019 CLINICAL DATA:  Pt unable to  answer hx questions. Only moaned when Tech questioned her. Per nurse: Pt from [place] rehab. Reports difficulty breathing since 1100 this AM. Lethargic and SOB. 100% on RA, wheezing heard. 5 mg or albuterol given en route. EXAM: PORTABLE CHEST 1 VIEW COMPARISON:  Chest radiograph 06/05/2019 FINDINGS: Stable cardiomediastinal contours with enlarged heart size. Left ICD in place. Central pulmonary vascular congestion. Diffuse mild bilateral interstitial opacities likely reflecting trace edema. No new focal consolidation. No pneumothorax or large pleural effusion. No acute finding in the visualized skeleton. IMPRESSION: Cardiomegaly with central pulmonary vascular congestion and mild bilateral interstitial opacities likely reflecting edema.  Electronically Signed   By: Audie Pinto M.D.   On: 06/09/2019 15:59      Medications:     Scheduled Medications: . amiodarone  200 mg Oral BID  . apixaban  2.5 mg Oral BID  . atorvastatin  40 mg Oral q1800  . carvedilol  3.125 mg Oral BID WC  . feeding supplement (ENSURE ENLIVE)  237 mL Oral Q24H  . ferrous gluconate  324 mg Oral BID  . furosemide  80 mg Intravenous Q12H  . lactulose  20 g Oral BID  . mexiletine  200 mg Oral BID  . pantoprazole  40 mg Oral Daily  . sodium chloride flush  3 mL Intravenous Q12H  . sucralfate  1 g Oral TID WC & HS     Infusions: . sodium chloride       PRN Medications:  sodium chloride, acetaminophen **OR** acetaminophen, albuterol, HYDROcodone-acetaminophen, ondansetron **OR** ondansetron (ZOFRAN) IV, sodium chloride flush   Assessment/Plan:   1. Acute on chronic systolic HF (end-stage0, NYHA Class IV: NICM. Most recent echo 7/20 showed further reduction in EF, down from previous baseline of 20-25% to 15%. GDMT limited by CKD.Not felt to be a candidate for advanced therapies(I.e VAD) given debility and renal failure.Poor candidate for HD. Also withh/orecurrent VT, not a candidate for home inotropes.  - symptomatically improved with IV diuresis. Still volume overloaded.  - continue lasix 80 IV bid.  - follow renal function closely - continue carvedilol - no ACE/ARB/ARNI with CKD IV - Consider Bidil as tolerated - Not candidate for advanced therapies - Long talk about Palliative Care and Hospice options. She is not willing to accept Hospice care at this point. She confirms DNR/DNI status  2. CKD IV - renal function improved today with decongestion. Will follow with diuresis.   3. H/o VT - rhythm stable - continue mexilitene - keep K. 4.0 Mg > 2.0  4. Debility - PT recommends SNF  5. DNR/DNI   Length of Stay: 2   Glori Bickers MD 06/11/2019, 2:17 PM  Advanced Heart Failure Team Pager [phone] (M-F; 7a - 4p)   Please contact [facility] for night-coverage after hours (4p -7a ) and weekends on [email or web address]

## 2019-06-11 NOTE — Progress Notes (Signed)
Returned to patient's room to see if any family present who would understand a discussion of the AD.  No family present. Consulted with nurse who agreed patient not capable of understanding the document if explained to her.  Asked nurse to page Chaplain when family comes to visit.  Plan to continue to attempt notary of AD.  De Burrs Chaplain Resident

## 2019-06-11 NOTE — TOC Initial Note (Signed)
Transition of Care [facility]) - Initial/Assessment Note    Patient Details  Name: Stephanie Yates MRN: [id number] Date of Birth: 1945-09-03  [facility] CM/SW Contact:    Eileen Stanford, LCSW Phone Number: 06/11/2019, 10:42 AM  Clinical Narrative:     Pt is refusing to return to SNF. Pt's daughter and pt states she will d/c home with her Grandson. Pt will need HH and DME. MD to put in orders.              Expected Discharge Plan: [place] Barriers to Discharge: Continued Medical Work up   Patient Goals and CMS Choice Patient states their goals for this hospitalization and ongoing recovery are:: "to go home"   Choice offered to / list presented to : Patient  Expected Discharge Plan and Services Expected Discharge Plan: [place] In-house Referral: Clinical Social Work   Post Acute Care Choice: [place] arrangements for the past 2 months: [place]                                      Prior Living Arrangements/Services Living arrangements for the past 2 months: Single Family Home Lives with:: Other (Comment)(Grandson) Patient language and need for interpreter reviewed:: Yes Do you feel safe going back to the place where you live?: Yes      Need for Family Participation in Patient Care: Yes (Comment) Care giver support system in place?: Yes (comment)   Criminal Activity/Legal Involvement Pertinent to Current Situation/Hospitalization: No - Comment as needed  Activities of Daily Living      Permission Sought/Granted Permission sought to share information with : Family Supports    Share Information with NAME: Magda Paganini     Permission granted to share info w Relationship: Daughter     Emotional Assessment Appearance:: Appears stated age Attitude/Demeanor/Rapport: Inconsistent Affect (typically observed): Calm, Flat Orientation: : Oriented to Self, Oriented to Place, Oriented to  Time, Oriented to  Situation Alcohol / Substance Use: Not Applicable Psych Involvement: No (comment)  Admission diagnosis:  CHF exacerbation ([place]) [I50.9] Acute on chronic congestive heart failure, unspecified heart failure type [facility]) [I50.9] Patient Active Problem List   Diagnosis Date Noted  . DNR (do not resuscitate)   . Adult failure to thrive   . Hypoalbuminemia 06/09/2019  . Leukocytosis 06/09/2019  . Elevated troponin 06/09/2019  . Goals of care, counseling/discussion   . Pressure injury of skin 05/29/2019  . Acute on chronic systolic CHF (congestive heart failure) ([place]) 05/27/2019  . Torsades de pointes ([place]) 05/14/2019  . Weakness generalized   . Palliative care by specialist   . Gallbladder mass 05/06/2019  . Volume overload 05/06/2019  . Vertigo 04/15/2019  . Abnormal liver function 04/15/2019  . Right adrenal mass ([place]) possible adenoma 03/18/2019  . Nephrolithiasis 03/18/2019  . Prolonged QT interval 03/17/2019  . Renal failure (ARF), acute on chronic ([place]) 03/17/2019  . Nausea and vomiting 03/17/2019  . Epigastric abdominal pain 01/29/2019  . Gastritis 01/28/2019  . CKD (chronic kidney disease) stage 4, GFR 15-29 ml/min (HCC) 01/28/2019  . NSVT (nonsustained ventricular tachycardia) ([place]) 01/17/2019  . AKI (acute kidney injury) ([place]) 01/12/2019  . DNR (do not resuscitate) discussion   . CHF exacerbation ([place]) 01/06/2019  . Influenza A 09/02/2018  . Lactic acidosis 09/02/2018  . Chronic combined systolic and diastolic congestive heart failure (  [place]) 09/02/2018  . Rapid atrial fibrillation ([place]) 06/08/2018  . Acute on chronic combined systolic and diastolic heart failure ([place]) 06/07/2018  . Anemia 06/07/2018  . Acute GI bleeding 06/15/2017  . Chronic anticoagulation   . Acute on chronic congestive heart failure ([place])   . HLD (hyperlipidemia) 03/05/2017  . CKD (chronic kidney disease), stage III 03/05/2017  . Chest pain 03/05/2017  . Symptomatic anemia 03/05/2017  . PAF  (paroxysmal atrial fibrillation) ([place]) 04/01/2013  . ICD (implantable cardioverter-defibrillator), dual, [place] 04/01/2013  . PVC's (premature ventricular contractions) 04/01/2013  . Hypokalemia 12/21/2012  . History of tobacco abuse 12/21/2012  . Transaminitis 12/21/2012  . Sinus bradycardia 12/21/2012  . Hyperkalemia 12/20/2012  . Nonischemic cardiomyopathy ([place]) 12/20/2012  . UTI (urinary tract infection) 12/20/2012  . Acute on chronic combined systolic and diastolic CHF (congestive heart failure) ([place]) 12/20/2012  . Ventricular tachycardia ([place]) 12/14/2012  . Hypertension 12/14/2012   PCP:  Josetta Huddle, MD Pharmacy:  No Pharmacies Listed    Social Determinants of Health (SDOH) Interventions    Readmission Risk Interventions Readmission Risk Prevention Plan 06/05/2019 05/27/2019 05/10/2019  Transportation Screening Complete Complete Complete  PCP or Specialist Appt within 3-5 Days - - -  HRI or [place] for [place] - - -  Medication Review Press photographer) Complete Complete Complete  Med Review Comments - - -  PCP or Specialist appointment within 3-5 days of discharge Complete Complete Complete  HRI or Home Care Consult Complete Complete Complete  SW Recovery Care/Counseling Consult Complete Complete Complete  Palliative Care Screening Not Applicable Not Applicable Not Applicable  Skilled Nursing Facility Complete Not Applicable Not Applicable  Some recent data might be hidden

## 2019-06-12 ENCOUNTER — Encounter (HOSPITAL_COMMUNITY): Payer: Self-pay | Admitting: Internal Medicine

## 2019-06-12 DIAGNOSIS — N1831 Chronic kidney disease, stage 3a: Secondary | ICD-10-CM

## 2019-06-12 LAB — BASIC METABOLIC PANEL
Anion gap: 9 (ref 5–15)
BUN: 32 mg/dL — ABNORMAL HIGH (ref 8–23)
CO2: 27 mmol/L (ref 22–32)
Calcium: 8.4 mg/dL — ABNORMAL LOW (ref 8.9–10.3)
Chloride: 108 mmol/L (ref 98–111)
Creatinine, Ser: 2.05 mg/dL — ABNORMAL HIGH (ref 0.44–1.00)
GFR calc Af Amer: 27 mL/min — ABNORMAL LOW (ref >60)
GFR calc non Af Amer: 23 mL/min — ABNORMAL LOW (ref >60)
Glucose, Bld: 94 mg/dL (ref 70–99)
Potassium: 4.1 mmol/L (ref 3.5–5.1)
Sodium: 144 mmol/L (ref 135–145)

## 2019-06-12 LAB — CBC
HCT: 24.2 % — ABNORMAL LOW (ref 36.0–46.0)
Hemoglobin: 7.9 g/dL — ABNORMAL LOW (ref 12.0–15.0)
MCH: 25.6 pg — ABNORMAL LOW (ref 26.0–34.0)
MCHC: 32.6 g/dL (ref 30.0–36.0)
MCV: 78.3 fL — ABNORMAL LOW (ref 80.0–100.0)
Platelets: 252 10*3/uL (ref 150–400)
RBC: 3.09 MIL/uL — ABNORMAL LOW (ref 3.87–5.11)
RDW: 16.7 % — ABNORMAL HIGH (ref 11.5–15.5)
WBC: 16.2 10*3/uL — ABNORMAL HIGH (ref 4.0–10.5)
nRBC: 0 % (ref 0.0–0.2)

## 2019-06-12 NOTE — Progress Notes (Signed)
PROGRESS NOTE  Stephanie Yates ZOX:[id number] DOB: 10-Mar-1946 DOA: 06/09/2019 PCP: Josetta Huddle, MD  Brief History   73 y.o. female with medical history significant of CHF/NICM (EF 10 to 15%), VT s/p AICD 2014 and biventricular PPM, paroxysmal A. fib/bradycardia, chronic anemia, CKD-4, HTN, HLD, moderate protein calorie malnutrition and GIB, prolonged QT/torsade and multiple hospitalizations Admitted for acute on chronic combined systolic diastolic CHF exacerbation. Has been seen by HF team and had conversation with Palliative care along with her family yesterday afternoon.  The patient and her family have decided to make her a DNR, but they are not yet ready for hospice. She does not want a feeding tube under any circumstances. She states that she would not want "intense aggressive measures".   Consultants  Heart Failure Team Palliative Care  Procedures  . None  Antibiotics   Anti-infectives (From admission, onward)   None    .  Subjective  The patient is resting comfortably. No new complaints.  Objective   Vitals:  Vitals:   06/11/19 2047 06/12/19 0335  BP: (!) 114/55 (!) 135/55  Pulse: 60 (!) 59  Resp: 14 16  Temp: 97.7 F (36.5 C) 97.9 F (36.6 C)  SpO2: 100% 100%    Exam:  Exam:  Constitutional:  . The patient is awake, alert, and oriented to self. No acute distress. Respiratory:  . No increased work of breathing. . No wheezes or rhonchi . Small rales at right base . No tactile fremitus Cardiovascular:  . Regular rate and rhythm . No murmurs, ectopy, or gallups. . No lateral PMI. No thrills. Abdomen:  . Abdomen is soft, non-tender, non-distended . No hernias, masses, or organomegaly . Normoactive bowel sounds.  Musculoskeletal:  . No cyanosis . No clubbing . No edema Skin:  . No rashes, lesions, ulcers . palpation of skin: no induration or nodules Neurologic:  . CN 2-12 intact . Sensation all 4 extremities intact Psychiatric:  . Mental  status o Mood, affect appropriate o Orientation to person, place, time  . judgment and insight appear intact  I have personally reviewed the following:   Today's Data  . Vitals . BMP . CBC .  Micro Data  . Urine culture (06/09/2019): Insignificant growth . Urine Culture (06/01/2019): Klebsiella pneumoniae  Imaging  . CXR (06/09/2019): pulmonary edema  Scheduled Meds: . amiodarone  200 mg Oral BID  . apixaban  2.5 mg Oral BID  . atorvastatin  40 mg Oral q1800  . carvedilol  3.125 mg Oral BID WC  . feeding supplement (ENSURE ENLIVE)  237 mL Oral Q24H  . ferrous gluconate  324 mg Oral BID  . furosemide  80 mg Intravenous Q12H  . lactulose  20 g Oral BID  . mexiletine  200 mg Oral BID  . pantoprazole  40 mg Oral Daily  . sodium chloride flush  3 mL Intravenous Q12H  . sucralfate  1 g Oral TID WC & HS   Continuous Infusions: . sodium chloride      Active Problems:   Hypertension   Nonischemic cardiomyopathy (HCC)   Acute on chronic combined systolic and diastolic CHF (congestive heart failure) (HCC)   PAF (paroxysmal atrial fibrillation) ([place])   ICD (implantable cardioverter-defibrillator), dual, [facility]   HLD (hyperlipidemia)   CKD (chronic kidney disease), stage III   Symptomatic anemia   Acute on chronic combined systolic and diastolic heart failure (HCC)   CHF exacerbation (HCC)   NSVT (nonsustained ventricular tachycardia) (HCC)   Hypoalbuminemia  Leukocytosis   Elevated troponin   DNR (do not resuscitate)   Adult failure to thrive   LOS: 3 days   A & P  Acute on chronic combined CHF: The patient has been admitted to a telemetry bed. Serial EKG's have been followed for ischemia. Troponins elevated, but trended down 31-28.  They are chronically elevated and stable. BNP elevated at 2039.7. Cardiology has been consulted. The patient is being diuresed. Palliative care consult has also been called. She is now a DNR. Family and patient are not yet ready for  hospice. The patient has a an AICD/PPM (dual chamber)  Paroxysmal Atrial Fibrillation: The patient is being anticoagulated with Eliquis due to a CHADSVASC2 score of 5. Her rate and rhythm are under good control with Coreg and amiodarone. These will be continued.  Hyperlipidemia: Continue statin as at home  Chronic Kidney Disease III: Monitor electrolytes, creatinine, and volume status. Creatinine today is 2.05.  Symptomatic anemia: Likely multifactorial (CKD and iron deficiency).  Hypoalbuminemia: Noted. Consider use of albumin to improve diuresis. Monitor.  Hypertension: Blood pressures are well controlled on coreg.  I have seen and examined this patient myself. I have spent 32 minutes in her evaluation and care.  DVT prophylaxis: Eliquis CODE STATUS: DNR Family Communication: None available. Disposition: Return to facility.  Lukah Goswami, DO [facility] Direct contact: see [email or web address]  7PM-7AM contact night coverage as above 06/12/2019, 4:05 PM  LOS: 3 days

## 2019-06-12 NOTE — Progress Notes (Addendum)
Advanced Heart Failure Rounding Note   Subjective:    Minimal UOP. Only  800 cc out yesterday. Wt however improving, down from 136>>131>>128. SCr gradually improving 2.52>>2.19>>2.05.   She notes subjective improvement. Less abdominal pain. Breathing improved.   Had palliative care consult. Now DNR/DNI but still not comfortable with Hospice at this point.   Objective:   Weight Range:  Vital Signs:   Temp:  [97.3 F (36.3 C)-97.9 F (36.6 C)] 97.9 F (36.6 C) (12/16 0335) Pulse Rate:  [59-60] 59 (12/16 0335) Resp:  [14-16] 16 (12/16 0335) BP: (114-135)/(55-57) 135/55 (12/16 0335) SpO2:  [100 %] 100 % (12/16 0335) Weight:  [58.1 kg] 58.1 kg (12/16 0335) Last BM Date: 06/10/19  Weight change: Filed Weights   06/10/19 0622 06/11/19 0411 06/12/19 0335  Weight: 61.9 kg 59.7 kg 58.1 kg    Intake/Output:   Intake/Output Summary (Last 24 hours) at 06/12/2019 1128 Last data filed at 06/12/2019 0336 Gross per 24 hour  Intake 510 ml  Output 800 ml  Net -290 ml     Physical Exam: General:  Elderly Frail AAF. No resp difficulty HEENT: normal anicteric  Neck: supple. JVP 7-8Carotids 2+ bilat; no bruits. No lymphadenopathy or thryomegaly appreciated. Cor: PMI nondisplaced. Regular rate & rhythm. No rubs, gallops or murmurs. Lungs: clear no wheeze Abdomen: soft, non distended, mild diffuse tenderness w/ palpation No hepatosplenomegaly. No bruits or masses. Good bowel sounds. Extremities: no cyanosis, clubbing, rash, trace edema Neuro: alert & oriented x 3, cranial nerves grossly intact. moves all 4 extremities w/o difficulty. Affect pleasant   Telemetry: A-paced 60 Personally reviewed   Labs: Basic Metabolic Panel: Recent Labs  Lab 06/07/19 0153 06/09/19 1541 06/10/19 0432 06/11/19 0937 06/12/19 0410  NA 139 138 141 139 144  K 3.4* 4.9 4.2 3.6 4.1  CL 105 105 106 105 108  CO2 25 25 24 25 27   GLUCOSE 104* 158* 101* 140* 94  BUN 33* 39* 39* 35* 32*    CREATININE 2.39* 2.86* 2.52* 2.19* 2.05*  CALCIUM 8.2* 8.4* 8.1* 8.2* 8.4*  MG  --   --  2.0  --   --   PHOS  --   --  4.0  --   --     Liver Function Tests: Recent Labs  Lab 06/06/19 0405 06/07/19 0153 06/10/19 0432  AST 99* 82* 76*  ALT 40 36 35  ALKPHOS 120 110 104  BILITOT 1.0 1.5* 1.4*  PROT 6.2* 6.0* 6.2*  ALBUMIN 1.8* 1.7* 1.9*   No results for input(s): LIPASE, AMYLASE in the last 168 hours. Recent Labs  Lab 06/09/19 1935  AMMONIA 44*    CBC: Recent Labs  Lab 06/06/19 0405 06/06/19 1129 06/07/19 0153 06/09/19 1541 06/10/19 0432 06/11/19 0937 06/12/19 0410  WBC 17.7* 24.2* 16.5* 18.1* 13.5* 15.0* 16.2*  NEUTROABS 13.7* 19.1* 13.0* 15.3*  --   --   --   HGB 7.1* 8.3* 7.3* 8.4* 7.2* 7.9* 7.9*  HCT 21.3* 25.2* 22.2* 26.1* 22.1* 24.0* 24.2*  MCV 77.5* 77.5* 77.9* 78.9* 79.2* 77.7* 78.3*  PLT 209 381 218 284 214 246 252    Cardiac Enzymes: No results for input(s): CKTOTAL, CKMB, CKMBINDEX, TROPONINI in the last 168 hours.  BNP: BNP (last 3 results) Recent Labs    05/23/19 0057 05/26/19 1822 06/09/19 1541  BNP 1,286.3* 1,587.2* 2,039.7*    ProBNP (last 3 results) No results for input(s): PROBNP in the last 8760 hours.    Other results:  Imaging:  No results found.   Medications:     Scheduled Medications: . amiodarone  200 mg Oral BID  . apixaban  2.5 mg Oral BID  . atorvastatin  40 mg Oral q1800  . carvedilol  3.125 mg Oral BID WC  . feeding supplement (ENSURE ENLIVE)  237 mL Oral Q24H  . ferrous gluconate  324 mg Oral BID  . furosemide  80 mg Intravenous Q12H  . lactulose  20 g Oral BID  . mexiletine  200 mg Oral BID  . pantoprazole  40 mg Oral Daily  . sodium chloride flush  3 mL Intravenous Q12H  . sucralfate  1 g Oral TID WC & HS    Infusions: . sodium chloride      PRN Medications: sodium chloride, acetaminophen **OR** acetaminophen, albuterol, HYDROcodone-acetaminophen, ondansetron **OR** ondansetron (ZOFRAN) IV,  sodium chloride flush   Assessment/Plan:   1. Acute on chronic systolic HF (end-stage0, NYHA Class IV: NICM. Most recent echo 7/20 showed further reduction in EF, down from previous baseline of 20-25% to 15%. GDMT limited by CKD.Not felt to be a candidate for advanced therapies(I.e VAD) given debility and renal failure.Poor candidate for HD. Also withh/orecurrent VT, not a candidate for home inotropes.  - symptomatically improved with IV diuresis. Still volume overloaded.  - continue lasix 80 IV bid.  - follow renal function closely. SCr improving w/ diuresis.  - continue carvedilol - no ACE/ARB/ARNI with CKD IV - Consider Bidil as tolerated - Not candidate for advanced therapies - Long talk about Palliative Care and Hospice options. She is not willing to accept Hospice care at this point. She confirms DNR/DNI status - once euvolemic after treatment w/ IV diuretics, consider transition from PO Lasix to torsemide at d/c  2. CKD IV - renal function improved today with decongestion. - SCr down from  2.52>>2.19>>2.05.  Will follow with diuresis.   3. H/o VT - rhythm stable - continue amiodarone + mexilitene - keep K. 4.0 Mg > 2.0  4. Debility - PT recommends SNF  5. DNR/DNI   Length of Stay: 3   Brittainy Simmons PA-C 06/12/2019, 11:28 AM  Advanced Heart Failure Team Pager [phone] (M-F; [place])  Please contact [facility] for night-coverage after hours (4p -7a ) and weekends on [email or web address]   Patient seen and examined with the above-signed Advanced Practice Provider and/or Housestaff. I personally reviewed laboratory data, imaging studies and relevant notes. I independently examined the patient and formulated the important aspects of the plan. I have edited the note to reflect any of my changes or salient points. I have personally discussed the plan with the patient and/or family.  She remains quite frail. On IV lasix. Urine output slowing down but creatinine improved.  Weight now below previous d/c weight (130lb). Will continue Iv lasix one more day and then switch to po torsemide. She is unwilling to accept hospice. PT recommending SNF. She is at very high risk for readmission.   Glori Bickers, MD  5:09 PM

## 2019-06-12 NOTE — Progress Notes (Signed)
   Daily Progress Note   Patient Name: Stephanie Yates       Date: 06/12/2019 DOB: 06/06/1946  Age: 73 y.o. MRN#: [id number] Attending Physician: Karie Kirks, DO Primary Care Physician: Josetta Huddle, MD Admit Date: 06/09/2019  Progress Note from the Palliative Medicine Team at [facility] seen at bedside. Patient says that she is feeling well and is anxious to "get therapy". We talked about how she would get out of bed today if not with a physical therapist then with nursing. She was amenable to this. Patient stated that her abdominal pain had resolved.   Called patients daughter Stephanie Yates to update her on current plan of care. Stephanie Yates stated that she is most hopeful that her mother can get home for the holidays.   Contacts/Participants in Discussion: Primary Decision Maker: Stephanie Yates  Goals of Care/Code Status/Advance Care Planning: Code Status: DNAR/DNI Artificial feeding: No Antibiotics: Yes Diagnostics: Yes Rehospitalization: At the present time, YES  Symptom Management:  ? Bowel regiment:  Lactulose 20g PO BID ? Abdominal Pain, recently had CT abd/pelvis, KUB, [place], CI consult origin is unknown:  Tylenol 650mg  PO Q6H PRN  Norco 1-2 tabs PO Q4H ? Dyspnea:  Supplemental O2  Duonebs Q4H  Lasix per primary team ? Nausea:  Zofran Q6H PRN  Dispo:  Home with HH, grandson, Stephanie Yates will move in to provide daily assistance.  Patient and family do not want her to go to [facility]  Hospitalist, Dr. Benny Lennert provided an update  Time Spend with patient: 15 minutes to review the intricacies of MOST form and review patients wishes  Greater than 50%  of this time was spent counseling and coordinating care related to the above assessment and plan.  Stephanie Rumpf, NP  06/12/2019, 12:27 PM  Please contact Palliative Medicine Team phone at [phone] for questions and concerns.   Stephanie Lessen NP agrees with above assessment and plan

## 2019-06-13 DIAGNOSIS — R531 Weakness: Secondary | ICD-10-CM

## 2019-06-13 LAB — CBC WITH DIFFERENTIAL/PLATELET
Abs Immature Granulocytes: 0.08 10*3/uL — ABNORMAL HIGH (ref 0.00–0.07)
Basophils Absolute: 0.1 10*3/uL (ref 0.0–0.1)
Basophils Relative: 1 %
Eosinophils Absolute: 0.2 10*3/uL (ref 0.0–0.5)
Eosinophils Relative: 2 %
HCT: 23.5 % — ABNORMAL LOW (ref 36.0–46.0)
Hemoglobin: 7.5 g/dL — ABNORMAL LOW (ref 12.0–15.0)
Immature Granulocytes: 1 %
Lymphocytes Relative: 13 %
Lymphs Abs: 2 10*3/uL (ref 0.7–4.0)
MCH: 25.2 pg — ABNORMAL LOW (ref 26.0–34.0)
MCHC: 31.9 g/dL (ref 30.0–36.0)
MCV: 78.9 fL — ABNORMAL LOW (ref 80.0–100.0)
Monocytes Absolute: 1.4 10*3/uL — ABNORMAL HIGH (ref 0.1–1.0)
Monocytes Relative: 10 %
Neutro Abs: 11.1 10*3/uL — ABNORMAL HIGH (ref 1.7–7.7)
Neutrophils Relative %: 73 %
Platelets: 233 10*3/uL (ref 150–400)
RBC: 2.98 MIL/uL — ABNORMAL LOW (ref 3.87–5.11)
RDW: 16.9 % — ABNORMAL HIGH (ref 11.5–15.5)
WBC: 14.9 10*3/uL — ABNORMAL HIGH (ref 4.0–10.5)
nRBC: 0 % (ref 0.0–0.2)

## 2019-06-13 LAB — BASIC METABOLIC PANEL
Anion gap: 9 (ref 5–15)
BUN: 28 mg/dL — ABNORMAL HIGH (ref 8–23)
CO2: 29 mmol/L (ref 22–32)
Calcium: 8.1 mg/dL — ABNORMAL LOW (ref 8.9–10.3)
Chloride: 106 mmol/L (ref 98–111)
Creatinine, Ser: 1.95 mg/dL — ABNORMAL HIGH (ref 0.44–1.00)
GFR calc Af Amer: 28 mL/min — ABNORMAL LOW (ref >60)
GFR calc non Af Amer: 25 mL/min — ABNORMAL LOW (ref >60)
Glucose, Bld: 121 mg/dL — ABNORMAL HIGH (ref 70–99)
Potassium: 4.2 mmol/L (ref 3.5–5.1)
Sodium: 144 mmol/L (ref 135–145)

## 2019-06-13 MED ORDER — TORSEMIDE 20 MG PO TABS
40.0000 mg | ORAL_TABLET | Freq: Every day | ORAL | Status: DC
  Administered 2019-06-13 – 2019-06-17 (×5): 40 mg via ORAL
  Filled 2019-06-13 (×6): qty 2

## 2019-06-13 NOTE — Progress Notes (Signed)
PROGRESS NOTE  Stephanie Yates NLG:[id number] DOB: 1945-09-27 DOA: 06/09/2019 PCP: Josetta Huddle, MD  Brief History   73 y.o. female with medical history significant of CHF/NICM (EF 10 to 15%), VT s/p AICD 2014 and biventricular PPM, paroxysmal A. fib/bradycardia, chronic anemia, CKD-4, HTN, HLD, moderate protein calorie malnutrition and GIB, prolonged QT/torsade and multiple hospitalizations Admitted for acute on chronic combined systolic diastolic CHF exacerbation. Has been seen by HF team and had conversation with Palliative care along with her family yesterday afternoon.  The patient and her family have decided to make her a DNR, but they are not yet ready for hospice. She does not want a feeding tube under any circumstances. She states that she would not want "intense aggressive measures". Palliative Care is addressing whether or not the family wishes to have AICD turned off inline with their wishes for DNR/DNI.  Consultants  Heart Failure Team Palliative Care  Procedures  . None  Antibiotics   Anti-infectives (From admission, onward)   None     Subjective  The patient is resting comfortably. No new complaints.  Objective   Vitals:  Vitals:   06/13/19 0455 06/13/19 1303  BP: (!) 123/54 112/63  Pulse: 63 (!) 59  Resp: 16 16  Temp: 97.8 F (36.6 C) 97.7 F (36.5 C)  SpO2: 100% 100%    Exam:  Exam:  Constitutional:  . The patient is awake, alert, and oriented to self. No acute distress. Respiratory:  . No increased work of breathing. . No wheezes or rhonchi . Small rales at right base . No tactile fremitus Cardiovascular:  . Regular rate and rhythm . No murmurs, ectopy, or gallups. . No lateral PMI. No thrills. Abdomen:  . Abdomen is soft, non-tender, non-distended . No hernias, masses, or organomegaly . Normoactive bowel sounds.  Musculoskeletal:  . No cyanosis . No clubbing . No edema Skin:  . No rashes, lesions, ulcers . palpation of skin: no  induration or nodules Neurologic:  . CN 2-12 intact . Sensation all 4 extremities intact Psychiatric:  . Mental status o Mood, affect appropriate o Orientation to person, place, time  . judgment and insight appear intact  I have personally reviewed the following:   Today's Data  . Vitals . BMP . CBC .  Micro Data  . Urine culture (06/09/2019): Insignificant growth . Urine Culture (06/01/2019): Klebsiella pneumoniae  Imaging  . CXR (06/09/2019): pulmonary edema  Scheduled Meds: . amiodarone  200 mg Oral BID  . apixaban  2.5 mg Oral BID  . atorvastatin  40 mg Oral q1800  . carvedilol  3.125 mg Oral BID WC  . feeding supplement (ENSURE ENLIVE)  237 mL Oral Q24H  . ferrous gluconate  324 mg Oral BID  . lactulose  20 g Oral BID  . mexiletine  200 mg Oral BID  . pantoprazole  40 mg Oral Daily  . sodium chloride flush  3 mL Intravenous Q12H  . sucralfate  1 g Oral TID WC & HS  . torsemide  40 mg Oral Daily   Continuous Infusions: . sodium chloride      Active Problems:   Hypertension   Nonischemic cardiomyopathy (HCC)   Acute on chronic combined systolic and diastolic CHF (congestive heart failure) (HCC)   PAF (paroxysmal atrial fibrillation) ([place])   ICD (implantable cardioverter-defibrillator), dual, [place] [facility]   HLD (hyperlipidemia)   CKD (chronic kidney disease), stage III   Symptomatic anemia   Acute on chronic combined systolic and diastolic heart  failure (HCC)   CHF exacerbation (HCC)   NSVT (nonsustained ventricular tachycardia) (HCC)   Hypoalbuminemia   Leukocytosis   Elevated troponin   DNR (do not resuscitate)   Adult failure to thrive   LOS: 4 days   A & P  Acute on chronic combined CHF: The patient has been admitted to a telemetry bed. Serial EKG's have been followed for ischemia. Troponins elevated, but trended down 31-28.  They are chronically elevated and stable. BNP elevated at 2039.7. Cardiology has been consulted. The patient is being diuresed.  Palliative care consult has also been called. She is now a DNR. Family and patient are not yet ready for hospice. The patient has a an AICD/PPM (dual chamber). Palliative care is determining if their wish for DNR/DNI means that they want that turned off. Cardiology continues to fine tune the patient's volume status with lasix. She got an extra 80 mg IV this morning.  Paroxysmal Atrial Fibrillation: The patient is being anticoagulated with Eliquis due to a CHADSVASC2 score of 5. Her rate and rhythm are under good control with Coreg and amiodarone. These will be continued.  Hyperlipidemia: Continue statin as at home  Chronic Kidney Disease III: Monitor electrolytes, creatinine, and volume status. Creatinine today is 1.95.  Symptomatic anemia: Likely multifactorial (CKD and iron deficiency).  Hypoalbuminemia: Noted. Consider use of albumin to improve diuresis. Monitor.  Hypertension: Blood pressures are well controlled on coreg.  I have seen and examined this patient myself. I have spent 32 minutes in her evaluation and care.  DVT prophylaxis: Eliquis CODE STATUS: DNR Family Communication: None available. Disposition: Return to facility.  Barbra Miner, DO [facility] Direct contact: see [email or web address]  7PM-7AM contact night coverage as above 06/13/2019, 5:30 PM  LOS: 3 days

## 2019-06-13 NOTE — Progress Notes (Signed)
Patient ID: CLAUDETTE WERMUTH, female   DOB: 1946/02/22, 73 y.o.   MRN: [id number]  This NP visited patient at the bedside as a follow up for palliative medicine needs and emotional support.  Patient remains weak but is alert and oriented and tells me she is hopeful to go home today.  Patient remains weak but this is likely her new baseline  Left phone message with  daughter/[name] for continued conversation regarding goals of care, transitions of care, and consideration for deactivation of ICD,  await callback.   Previous conversations with the palliative medicine team significant for family/patient  decision for patient to return home with home health, under the care of her grandson Marya Amsler and other family members.  Patient and family will not consider skilled nursing facility.  They are not interested in hospice benefit at this time.  I worry about the patient's high risk for decompensation and highly recommend outpatient palliative care services on discharge.  Discussed with patient the importance of continued conversation with her family and their  medical providers regarding overall plan of care and treatment options,  ensuring decisions are within the context of the patients values and GOCs.  Questions and concerns addressed   Discussed with Dr Benny Lennert,    Total time spent on the unit was 25 minutes  Greater than 50% of the time was spent in counseling and coordination of care  Wadie Lessen NP  Palliative Medicine Team Team Phone # [phone] Pager [phone]

## 2019-06-13 NOTE — Progress Notes (Signed)
Physical Therapy Treatment Patient Details Name: Stephanie Yates MRN: [id number] DOB: 1946/01/02 Today's Date: 06/13/2019    History of Present Illness 73yo female with recent hospitalization due to CHF exacerbation and fluid overload; during this stay had a neurological event (possible CVA vs seizures), and was ultimately discharged to SNF. SHe now returns from SNF due to increased lethargy and increased SOB. PMH chornic anemia, CHF, CKD stage IV, HLD, HTN, A-fib, mitral regurgitation, V-tach s/p AICD/PPM, ongoing GIB    PT Comments    Patient received in bed, clearly feeling much better today and willing to participate in therapy. Required MaxA for bed mobility, once up able to scoot to EOB with Min guard and BUE support; able to stand with RW/MinAx2 and pivoted to chair with maxA of 1/min guard of 1. Tends to try to sit far too early due to fatigue and needed cues for safety and sequencing. She was left up in the chair with all needs met, chair alarm active, and visitor present. Continue to recommend SNF moving forward.     Follow Up Recommendations  SNF;Supervision/Assistance - 24 hour(if they refuse SNF, will also need 24/7A at home,  hospital bed, WC , hoyer lift)     Equipment Recommendations  Rolling walker with 5" wheels;3in1 (PT)    Recommendations for Other Services       Precautions / Restrictions Precautions Precautions: Fall Precaution Comments: grossly weak, fatigues quickly Restrictions Weight Bearing Restrictions: No    Mobility  Bed Mobility Overal bed mobility: Needs Assistance Bed Mobility: Supine to Sit     Supine to sit: Max assist     General bed mobility comments: MaxA of one to bring trunk up then min guard to maintain balance and scoot forward to EOB  Transfers Overall transfer level: Needs assistance Equipment used: Rolling walker (2 wheeled) Transfers: Sit to/from [facility] Sit to Stand: Min assist;+2 physical assistance;+2  safety/equipment         General transfer comment: MinAx2 to boost to standing and gain balance, MaxA of 1/Min guard of 1 to pivot to recliner, tends to sit down too quickly and needed cues for safety  Ambulation/Gait                 Stairs             Wheelchair Mobility    Modified Rankin (Stroke Patients Only)       Balance Overall balance assessment: Needs assistance Sitting-balance support: Bilateral upper extremity supported;Feet supported Sitting balance-Leahy Scale: Fair Sitting balance - Comments: min guard to maintin balance, much improved trunk control in dynamic situations   Standing balance support: Bilateral upper extremity supported;During functional activity Standing balance-Leahy Scale: Poor Standing balance comment: reliant on external support                            Cognition Arousal/Alertness: Awake/alert Behavior During Therapy: Flat affect                       Current Attention Level: Sustained Memory: Decreased short-term memory Following Commands: Follows one step commands consistently;Follows one step commands with increased time Safety/Judgement: Decreased awareness of deficits;Decreased awareness of safety Awareness: Intellectual Problem Solving: Slow processing;Decreased initiation;Difficulty sequencing;Requires verbal cues;Requires tactile cues General Comments: continues to require increased time to respond to cues, but much more alert and interactive today      Exercises      General  Comments General comments (skin integrity, edema, etc.): VSS      Pertinent Vitals/Pain Pain Assessment: No/denies pain Pain Score: 0-No pain Pain Intervention(s): Limited activity within patient's tolerance;Monitored during session    Home Living                      Prior Function            PT Goals (current goals can now be found in the care plan section) Acute Rehab PT Goals Patient Stated  Goal: feel better, less stomach pain PT Goal Formulation: With patient Time For Goal Achievement: 06/24/19 Potential to Achieve Goals: Fair Progress towards PT goals: Progressing toward goals    Frequency    Min 2X/week(abdomen)      PT Plan Current plan remains appropriate    Co-evaluation              AM-PAC PT "6 Clicks" Mobility   Outcome Measure  Help needed turning from your back to your side while in a flat bed without using bedrails?: A Little Help needed moving from lying on your back to sitting on the side of a flat bed without using bedrails?: A Lot Help needed moving to and from a bed to a chair (including a wheelchair)?: A Lot Help needed standing up from a chair using your arms (e.g., wheelchair or bedside chair)?: A Lot Help needed to walk in hospital room?: A Lot Help needed climbing 3-5 steps with a railing? : Total 6 Click Score: 12    End of Session Equipment Utilized During Treatment: Oxygen;Gait belt Activity Tolerance: Patient tolerated treatment well Patient left: in chair;with call bell/phone within reach;with chair alarm set Nurse Communication: Mobility status PT Visit Diagnosis: Muscle weakness (generalized) (M62.81);Difficulty in walking, not elsewhere classified (R26.2);Other symptoms and signs involving the nervous system (R29.898) Pain - Right/Left: (abdomen)     Time: 0505-6788 PT Time Calculation (min) (ACUTE ONLY): 21 min  Charges:  $Therapeutic Activity: 8-22 mins                     Windell Norfolk, DPT, PN1   Supplemental Physical Therapist [place]    Pager [phone] Acute Rehab Office [phone]

## 2019-06-13 NOTE — Care Management Important Message (Signed)
Important Message  Patient Details  Name: Stephanie Yates MRN: [id number] Date of Birth: 08-Jun-1946   Medicare Important Message Given:  Yes     Shelda Altes 06/13/2019, 1:03 PM

## 2019-06-13 NOTE — Progress Notes (Addendum)
Advanced Heart Failure Rounding Note   Subjective:    Minimal UOP. Only 300 cc recorded yesterday but doubt accurate. Trending back up from 136>>131>>128>>131. SCr gradually improving 2.52>>2.19>>2.05>>1.95  Continues to endorse subjective improvement. Breathing improved. Less abdominal pain.   Had palliative care consult. Now DNR/DNI but still not comfortable with Hospice at this point. We discussed this again today.   Objective:   Weight Range:  Vital Signs:   Temp:  [97.8 F (36.6 C)-98.2 F (36.8 C)] 97.8 F (36.6 C) (12/17 0455) Pulse Rate:  [60-63] 63 (12/17 0455) Resp:  [16-20] 16 (12/17 0455) BP: (111-123)/(54-60) 123/54 (12/17 0455) SpO2:  [100 %] 100 % (12/17 0455) Weight:  [59.5 kg] 59.5 kg (12/17 0455) Last BM Date: 06/11/19  Weight change: Filed Weights   06/11/19 0411 06/12/19 0335 06/13/19 0455  Weight: 59.7 kg 58.1 kg 59.5 kg    Intake/Output:   Intake/Output Summary (Last 24 hours) at 06/13/2019 1210 Last data filed at 06/13/2019 0456 Gross per 24 hour  Intake 360 ml  Output 300 ml  Net 60 ml     Physical Exam: General:  Elderly Frail AAF. No resp difficulty HEENT: normal anicteric  Neck: supple. Elevated JVD. Carotids 2+ bilat; no bruits. No lymphadenopathy or thryomegaly appreciated. Cor: PMI nondisplaced. Regular rate & rhythm. No rubs, gallops or murmurs. Lungs: clear no wheeze Abdomen: soft, non distended, No tenderness w/ palpation (improved) No hepatosplenomegaly. No bruits or masses. Good bowel sounds. Extremities: no cyanosis, clubbing, rash, trace edema Neuro: alert & oriented x 3, cranial nerves grossly intact. moves all 4 extremities w/o difficulty. Affect pleasant   Telemetry: A-paced 60 Personally reviewed   Labs: Basic Metabolic Panel: Recent Labs  Lab 06/09/19 1541 06/10/19 0432 06/11/19 0937 06/12/19 0410 06/13/19 0404  NA 138 141 139 144 144  K 4.9 4.2 3.6 4.1 4.2  CL 105 106 105 108 106  CO2 25 24 25 27 29     GLUCOSE 158* 101* 140* 94 121*  BUN 39* 39* 35* 32* 28*  CREATININE 2.86* 2.52* 2.19* 2.05* 1.95*  CALCIUM 8.4* 8.1* 8.2* 8.4* 8.1*  MG  --  2.0  --   --   --   PHOS  --  4.0  --   --   --     Liver Function Tests: Recent Labs  Lab 06/07/19 0153 06/10/19 0432  AST 82* 76*  ALT 36 35  ALKPHOS 110 104  BILITOT 1.5* 1.4*  PROT 6.0* 6.2*  ALBUMIN 1.7* 1.9*   No results for input(s): LIPASE, AMYLASE in the last 168 hours. Recent Labs  Lab 06/09/19 1935  AMMONIA 44*    CBC: Recent Labs  Lab 06/07/19 0153 06/09/19 1541 06/10/19 0432 06/11/19 0937 06/12/19 0410 06/13/19 0404  WBC 16.5* 18.1* 13.5* 15.0* 16.2* 14.9*  NEUTROABS 13.0* 15.3*  --   --   --  11.1*  HGB 7.3* 8.4* 7.2* 7.9* 7.9* 7.5*  HCT 22.2* 26.1* 22.1* 24.0* 24.2* 23.5*  MCV 77.9* 78.9* 79.2* 77.7* 78.3* 78.9*  PLT 218 284 214 246 252 233    Cardiac Enzymes: No results for input(s): CKTOTAL, CKMB, CKMBINDEX, TROPONINI in the last 168 hours.  BNP: BNP (last 3 results) Recent Labs    05/23/19 0057 05/26/19 1822 06/09/19 1541  BNP 1,286.3* 1,587.2* 2,039.7*    ProBNP (last 3 results) No results for input(s): PROBNP in the last 8760 hours.    Other results:  Imaging: No results found.   Medications:  Scheduled Medications: . amiodarone  200 mg Oral BID  . apixaban  2.5 mg Oral BID  . atorvastatin  40 mg Oral q1800  . carvedilol  3.125 mg Oral BID WC  . feeding supplement (ENSURE ENLIVE)  237 mL Oral Q24H  . ferrous gluconate  324 mg Oral BID  . furosemide  80 mg Intravenous Q12H  . lactulose  20 g Oral BID  . mexiletine  200 mg Oral BID  . pantoprazole  40 mg Oral Daily  . sodium chloride flush  3 mL Intravenous Q12H  . sucralfate  1 g Oral TID WC & HS    Infusions: . sodium chloride      PRN Medications: sodium chloride, acetaminophen **OR** acetaminophen, albuterol, HYDROcodone-acetaminophen, ondansetron **OR** ondansetron (ZOFRAN) IV, sodium chloride  flush   Assessment/Plan:   1. Acute on chronic systolic HF (end-stage0, NYHA Class IV: NICM. Most recent echo 7/20 showed further reduction in EF, down from previous baseline of 20-25% to 15%. GDMT limited by CKD.Not felt to be a candidate for advanced therapies(I.e VAD) given debility and renal failure.Poor candidate for HD. Also withh/orecurrent VT, not a candidate for home inotropes.  - symptomatically improved with IV diuresis but wt trending back up. SCr improved w/ diuretics. - received an additional lasix 80 IV this am. Plan to switch to PO diuretics. Torsemide 40 mg once daily  - f/u wt and BMP in the AM.    - continue carvedilol - no ACE/ARB/ARNI with CKD IV - Consider Bidil as tolerated - Not candidate for advanced therapies - Long talk about Palliative Care and Hospice options. She is not willing to accept Hospice care at this point. She confirms DNR/DNI status   2. CKD IV - renal function improved today with decongestion. - SCr down from  2.52>>2.19>>2.05>>1.95.  Will follow with diuresis.   3. H/o VT - rhythm stable - continue amiodarone + mexilitene - keep K. 4.0 Mg > 2.0  4. Debility - PT recommends SNF  5. DNR/DNI   Length of Stay: 4   Brittainy Simmons PA-C 06/13/2019, 12:10 PM  Advanced Heart Failure Team Pager [phone] (M-F; [place])  Please contact [facility] for night-coverage after hours (4p -7a ) and weekends on [email or web address]  Patient seen and examined with the above-signed Advanced Practice Provider and/or Housestaff. I personally reviewed laboratory data, imaging studies and relevant notes. I independently examined the patient and formulated the important aspects of the plan. I have edited the note to reflect any of my changes or salient points. I have personally discussed the plan with the patient and/or family.  Breathing better. But now has developed orthostasis. This is probably as dry as we can get her. Agree with switching back to oral  diuretics. She is adamantly refusing SNF and wants to go home. She is also refusing Hospice.  She will be very high risk for severe decompensation at home and readmission. We will consider  Paramedicine involvement.   Glori Bickers, MD  7:41 PM

## 2019-06-14 ENCOUNTER — Inpatient Hospital Stay (HOSPITAL_COMMUNITY): Payer: Medicare Other

## 2019-06-14 LAB — BASIC METABOLIC PANEL
Anion gap: 10 (ref 5–15)
BUN: 26 mg/dL — ABNORMAL HIGH (ref 8–23)
CO2: 27 mmol/L (ref 22–32)
Calcium: 8.1 mg/dL — ABNORMAL LOW (ref 8.9–10.3)
Chloride: 105 mmol/L (ref 98–111)
Creatinine, Ser: 1.73 mg/dL — ABNORMAL HIGH (ref 0.44–1.00)
GFR calc Af Amer: 33 mL/min — ABNORMAL LOW (ref >60)
GFR calc non Af Amer: 28 mL/min — ABNORMAL LOW (ref >60)
Glucose, Bld: 91 mg/dL (ref 70–99)
Potassium: 3.8 mmol/L (ref 3.5–5.1)
Sodium: 142 mmol/L (ref 135–145)

## 2019-06-14 LAB — GLUCOSE, CAPILLARY: Glucose-Capillary: 100 mg/dL — ABNORMAL HIGH (ref 70–99)

## 2019-06-14 LAB — CBC
HCT: 24.1 % — ABNORMAL LOW (ref 36.0–46.0)
Hemoglobin: 7.7 g/dL — ABNORMAL LOW (ref 12.0–15.0)
MCH: 25.1 pg — ABNORMAL LOW (ref 26.0–34.0)
MCHC: 32 g/dL (ref 30.0–36.0)
MCV: 78.5 fL — ABNORMAL LOW (ref 80.0–100.0)
Platelets: 253 10*3/uL (ref 150–400)
RBC: 3.07 MIL/uL — ABNORMAL LOW (ref 3.87–5.11)
RDW: 17 % — ABNORMAL HIGH (ref 11.5–15.5)
WBC: 15.1 10*3/uL — ABNORMAL HIGH (ref 4.0–10.5)
nRBC: 0 % (ref 0.0–0.2)

## 2019-06-14 NOTE — Progress Notes (Signed)
PROGRESS NOTE  Stephanie Yates FTD:[id number] DOB: Dec 01, 1945 DOA: 06/09/2019 PCP: Josetta Huddle, MD  Brief History   73 y.o. female with medical history significant of CHF/NICM (EF 10 to 15%), VT s/p AICD 2014 and biventricular PPM, paroxysmal A. fib/bradycardia, chronic anemia, CKD-4, HTN, HLD, moderate protein calorie malnutrition and GIB, prolonged QT/torsade and multiple hospitalizations Admitted for acute on chronic combined systolic diastolic CHF exacerbation. Has been seen by HF team and had conversation with Palliative care along with her family yesterday afternoon.  The patient and her family have decided to make her a DNR, but they are not yet ready for hospice. She does not want a feeding tube under any circumstances. She states that she would not want "intense aggressive measures". Palliative Care is addressing whether or not the family wishes to have AICD turned off inline with their wishes for DNR/DNI.  Consultants  Heart Failure Team Palliative Care  Procedures  . None  Antibiotics   Anti-infectives (From admission, onward)   None     Subjective  The patient is resting comfortably. No new complaints.  Objective   Vitals:  Vitals:   06/14/19 0418 06/14/19 0824  BP: (!) 128/59 (!) 121/49  Pulse:  60  Resp:    Temp: 98.6 F (37 C)   SpO2: 100%    Exam:  Constitutional:  . The patient is awake, alert, and oriented to self. No acute distress. Respiratory:  . No increased work of breathing. . No wheezes or rhonchi . Small rales at right base . No tactile fremitus Cardiovascular:  . Regular rate and rhythm . No murmurs, ectopy, or gallups. . No lateral PMI. No thrills. Abdomen:  . Abdomen is soft, non-tender, non-distended . No hernias, masses, or organomegaly . Normoactive bowel sounds.  Musculoskeletal:  . No cyanosis . No clubbing . No edema Skin:  . No rashes, lesions, ulcers . palpation of skin: no induration or nodules Neurologic:  . CN  2-12 intact . Sensation all 4 extremities intact Psychiatric:  . Mental status o Mood, affect appropriate o Orientation to person, place, time  . judgment and insight appear intact  I have personally reviewed the following:   Today's Data  . Vitals . BMP . CBC .  Micro Data  . Urine culture (06/09/2019): Insignificant growth . Urine Culture (06/01/2019): Klebsiella pneumoniae  Imaging  . CXR (06/09/2019): pulmonary edema  Scheduled Meds: . amiodarone  200 mg Oral BID  . apixaban  2.5 mg Oral BID  . atorvastatin  40 mg Oral q1800  . carvedilol  3.125 mg Oral BID WC  . feeding supplement (ENSURE ENLIVE)  237 mL Oral Q24H  . ferrous gluconate  324 mg Oral BID  . lactulose  20 g Oral BID  . mexiletine  200 mg Oral BID  . pantoprazole  40 mg Oral Daily  . sodium chloride flush  3 mL Intravenous Q12H  . sucralfate  1 g Oral TID WC & HS  . torsemide  40 mg Oral Daily   Continuous Infusions: . sodium chloride      Active Problems:   Hypertension   Nonischemic cardiomyopathy (HCC)   Acute on chronic combined systolic and diastolic CHF (congestive heart failure) (HCC)   PAF (paroxysmal atrial fibrillation) ([place])   ICD (implantable cardioverter-defibrillator), dual, [facility]   HLD (hyperlipidemia)   CKD (chronic kidney disease), stage III   Symptomatic anemia   Acute on chronic combined systolic and diastolic heart failure (HCC)   CHF exacerbation (  HCC)   NSVT (nonsustained ventricular tachycardia) (HCC)   Hypoalbuminemia   Leukocytosis   Elevated troponin   DNR (do not resuscitate)   Adult failure to thrive   LOS: 5 days   A & P  Acute on chronic combined CHF: The patient has been admitted to a telemetry bed. Serial EKG's have been followed for ischemia. Troponins elevated, but trended down 31-28.  They are chronically elevated and stable. BNP elevated at 2039.7. Cardiology has been consulted. The patient is being diuresed. Palliative care consult has also been  called. She is now a DNR. Family and patient are not yet ready for hospice. The patient has a an AICD/PPM (dual chamber). Palliative care is determining if their wish for DNR/DNI means that they want that turned off. Cardiology continues to fine tune the patient's volume status with lasix. She got an extra 80 mg IV yesterday morning. The patient has been transitioned to oral lasix. She was seen by Dr. Tempie Hoist this morning. He feels that the best plan for the patient would be SNF and hospice. The patient and family reject both.   Paroxysmal Atrial Fibrillation: The patient is being anticoagulated with Eliquis due to a CHADSVASC2 score of 5. Her rate and rhythm are under good control with Coreg and amiodarone. These will be continued.  Hyperlipidemia: Continue statin as at home  Chronic Kidney Disease III: Monitor electrolytes, creatinine, and volume status. Creatinine today is 1.73.  Symptomatic anemia: Likely multifactorial (CKD and iron deficiency).  Hypoalbuminemia: Noted. Consider use of albumin to improve diuresis. Monitor.  Hypertension: Blood pressures are well controlled on coreg.  I have seen and examined this patient myself. I have spent 34 minutes in her evaluation and care.  DVT prophylaxis: Eliquis CODE STATUS: DNR Family Communication: None available. Disposition: Return to facility.  Jinx Gilden, DO [facility] Direct contact: see [email or web address]  7PM-7AM contact night coverage as above 06/14/2019, 6:11 PM  LOS: 3 days

## 2019-06-14 NOTE — Progress Notes (Addendum)
Advanced Heart Failure Rounding Note   Subjective:   Creatinine trending down to 1.73.   Complaining of fatigue. Denies SOB.   Objective:   Weight Range:  Vital Signs:   Temp:  [97.5 F (36.4 C)-98.6 F (37 C)] 98.6 F (37 C) (12/18 0418) Pulse Rate:  [60] 60 (12/18 0824) Resp:  [17] 17 (12/17 2126) BP: (111-128)/(49-59) 121/49 (12/18 0824) SpO2:  [100 %] 100 % (12/18 0418) Weight:  [58 kg] 58 kg (12/18 0418) Last BM Date: 06/14/19  Weight change: Filed Weights   06/12/19 0335 06/13/19 0455 06/14/19 0418  Weight: 58.1 kg 59.5 kg 58 kg    Intake/Output:   Intake/Output Summary (Last 24 hours) at 06/14/2019 1508 Last data filed at 06/14/2019 0400 Gross per 24 hour  Intake 360 ml  Output 500 ml  Net -140 ml     Physical Exam: General:  Elderly  No resp difficulty HEENT: normal Neck: supple.JVP 8-9   Carotids 2+ bilat; no bruits. No lymphadenopathy or thryomegaly appreciated. Cor: PMI nondisplaced. Regular rate & rhythm. No rubs, gallops or murmurs. Lungs: clear Abdomen: soft, nontender, nondistended. No hepatosplenomegaly. No bruits or masses. Good bowel sounds. Extremities: no cyanosis, clubbing, rash, edema Neuro: alert & orientedx3, cranial nerves grossly intact. moves all 4 extremities w/o difficulty. Affect pleasant   Telemetry: A paced 60s   Labs: Basic Metabolic Panel: Recent Labs  Lab 06/10/19 0432 06/11/19 0937 06/12/19 0410 06/13/19 0404 06/14/19 0420  NA 141 139 144 144 142  K 4.2 3.6 4.1 4.2 3.8  CL 106 105 108 106 105  CO2 24 25 27 29 27   GLUCOSE 101* 140* 94 121* 91  BUN 39* 35* 32* 28* 26*  CREATININE 2.52* 2.19* 2.05* 1.95* 1.73*  CALCIUM 8.1* 8.2* 8.4* 8.1* 8.1*  MG 2.0  --   --   --   --   PHOS 4.0  --   --   --   --     Liver Function Tests: Recent Labs  Lab 06/10/19 0432  AST 76*  ALT 35  ALKPHOS 104  BILITOT 1.4*  PROT 6.2*  ALBUMIN 1.9*   No results for input(s): LIPASE, AMYLASE in the last 168 hours. Recent  Labs  Lab 06/09/19 1935  AMMONIA 44*    CBC: Recent Labs  Lab 06/09/19 1541 06/10/19 0432 06/11/19 0937 06/12/19 0410 06/13/19 0404 06/14/19 0420  WBC 18.1* 13.5* 15.0* 16.2* 14.9* 15.1*  NEUTROABS 15.3*  --   --   --  11.1*  --   HGB 8.4* 7.2* 7.9* 7.9* 7.5* 7.7*  HCT 26.1* 22.1* 24.0* 24.2* 23.5* 24.1*  MCV 78.9* 79.2* 77.7* 78.3* 78.9* 78.5*  PLT 284 214 246 252 233 253    Cardiac Enzymes: No results for input(s): CKTOTAL, CKMB, CKMBINDEX, TROPONINI in the last 168 hours.  BNP: BNP (last 3 results) Recent Labs    05/23/19 0057 05/26/19 1822 06/09/19 1541  BNP 1,286.3* 1,587.2* 2,039.7*    ProBNP (last 3 results) No results for input(s): PROBNP in the last 8760 hours.    Other results:  Imaging: No results found.   Medications:     Scheduled Medications: . amiodarone  200 mg Oral BID  . apixaban  2.5 mg Oral BID  . atorvastatin  40 mg Oral q1800  . carvedilol  3.125 mg Oral BID WC  . feeding supplement (ENSURE ENLIVE)  237 mL Oral Q24H  . ferrous gluconate  324 mg Oral BID  . lactulose  20 g  Oral BID  . mexiletine  200 mg Oral BID  . pantoprazole  40 mg Oral Daily  . sodium chloride flush  3 mL Intravenous Q12H  . sucralfate  1 g Oral TID WC & HS  . torsemide  40 mg Oral Daily    Infusions: . sodium chloride      PRN Medications: sodium chloride, acetaminophen **OR** acetaminophen, albuterol, HYDROcodone-acetaminophen, ondansetron **OR** ondansetron (ZOFRAN) IV, sodium chloride flush   Assessment/Plan:   1. Acute on chronic systolic HF (end-stage0, NYHA Class IV: NICM. Most recent echo 7/20 showed further reduction in EF, down from previous baseline of 20-25% to 15%. GDMT limited by CKD.Not felt to be a candidate for advanced therapies(I.e VAD) given debility and renal failure.Poor candidate for HD. Also withh/orecurrent VT, not a candidate for home inotropes.  - symptomatically improved with IV diuresis but wt trending back up. SCr  improved w/ diuretics. -Diuresed with IV lasix and transitioned to torsemide 40 mg daily today.  - continue carvedilol 3.125 mg twice a day. - no ACE/ARB/ARNI with CKD IV - Consider Bidil as tolerated - Not candidate for advanced therapies - Long talk about Palliative Care and Hospice options. She is not willing to accept Hospice care at this point. She confirms DNR/DNI status  2. CKD IV - renal function improved today with decongestion. - Creatinine peaked at 2.52. Today creatinine is down to 1.73.   3. H/o VT - rhythm stable - continue amiodarone + mexilitene - keep K. 4.0 Mg > 2.0  4. Debility  - PT recommends SNF she refuses.   5. DNR/DNI  Refuses Hospice/SNF.    Length of Stay: [place] PA-C 06/14/2019, 3:08 PM  Advanced Heart Failure Team Pager [phone] (M-F; 7a - 4p)  Please contact [facility] for night-coverage after hours (4p -7a ) and weekends on [email or web address]  Patient seen and examined with the above-signed Advanced Practice Provider and/or Housestaff. I personally reviewed laboratory data, imaging studies and relevant notes. I independently examined the patient and formulated the important aspects of the plan. I have edited the note to reflect any of my changes or salient points. I have personally discussed the plan with the patient and/or family.  She remains fatigued. Dizziness resolved. Creatinine improved. Now on po torsemide.   I think this is absolutely as good as we can get her and she remains NYHA IV.  I think Hospice is the only option foe her. She continues to refuse Hospice and SNF.   Not a candidate for advanced therapies. I have nothing left to offer her.   HF team will sign off.   Glori Bickers, MD  5:15 PM

## 2019-06-15 LAB — BASIC METABOLIC PANEL
Anion gap: 7 (ref 5–15)
BUN: 27 mg/dL — ABNORMAL HIGH (ref 8–23)
CO2: 29 mmol/L (ref 22–32)
Calcium: 8.3 mg/dL — ABNORMAL LOW (ref 8.9–10.3)
Chloride: 105 mmol/L (ref 98–111)
Creatinine, Ser: 1.9 mg/dL — ABNORMAL HIGH (ref 0.44–1.00)
GFR calc Af Amer: 29 mL/min — ABNORMAL LOW (ref >60)
GFR calc non Af Amer: 25 mL/min — ABNORMAL LOW (ref >60)
Glucose, Bld: 112 mg/dL — ABNORMAL HIGH (ref 70–99)
Potassium: 4.1 mmol/L (ref 3.5–5.1)
Sodium: 141 mmol/L (ref 135–145)

## 2019-06-15 LAB — CBC
HCT: 25.1 % — ABNORMAL LOW (ref 36.0–46.0)
Hemoglobin: 8.2 g/dL — ABNORMAL LOW (ref 12.0–15.0)
MCH: 25.3 pg — ABNORMAL LOW (ref 26.0–34.0)
MCHC: 32.7 g/dL (ref 30.0–36.0)
MCV: 77.5 fL — ABNORMAL LOW (ref 80.0–100.0)
Platelets: 229 10*3/uL (ref 150–400)
RBC: 3.24 MIL/uL — ABNORMAL LOW (ref 3.87–5.11)
RDW: 17.1 % — ABNORMAL HIGH (ref 11.5–15.5)
WBC: 15.6 10*3/uL — ABNORMAL HIGH (ref 4.0–10.5)
nRBC: 0 % (ref 0.0–0.2)

## 2019-06-15 MED ORDER — LACTULOSE 10 GM/15ML PO SOLN: 10.0000 g | Freq: Two times a day (BID) | ORAL | Status: DC

## 2019-06-15 NOTE — Progress Notes (Signed)
PROGRESS NOTE  Stephanie Yates TMH:[id number] DOB: [birth year] DOA: 06/09/2019 PCP: Josetta Huddle, MD  Brief History   73 y.o. female with medical history significant of CHF/NICM (EF 10 to 15%), VT s/p AICD 2014 and biventricular PPM, paroxysmal A. fib/bradycardia, chronic anemia, CKD-4, HTN, HLD, moderate protein calorie malnutrition and GIB, prolonged QT/torsade and multiple hospitalizations Admitted for acute on chronic combined systolic diastolic CHF exacerbation. Has been seen by HF team and had conversation with Palliative care along with her family yesterday afternoon.  The patient and her family have decided to make her a DNR, but they are not yet ready for hospice. She does not want a feeding tube under any circumstances. She states that she would not want "intense aggressive measures". Palliative Care is addressing whether or not the family wishes to have AICD turned off inline with their wishes for DNR/DNI.  I have attempted to reach the patient's daughter and grandson listed as emergency contacts. There is no answer. Social work has been consulted for SNF placement which is recommended by PT/OT. The patient is medically cleared for discharge, but no safe discharge is known for this elderly, frail, and debilitated patient.   Consultants  Heart Failure Team Palliative Care  Procedures  . None  Antibiotics   Anti-infectives (From admission, onward)   None     Subjective  The patient is resting comfortably. No new complaints.  Objective   Vitals:  Vitals:   06/15/19 0446 06/15/19 0814  BP: (!) 123/56 (!) 113/46  Pulse: (!) 59 60  Resp: 15 16  Temp: 97.8 F (36.6 C)   SpO2: 100% 100%   Exam:  Constitutional:  . The patient is awake and alert. No acute distress. Respiratory:  . No increased work of breathing. . No wheezes or rhonchi . Small rales at right base . No tactile fremitus Cardiovascular:  . Regular rate and rhythm . No murmurs, ectopy, or  gallups. . No lateral PMI. No thrills. Abdomen:  . Abdomen is soft, non-tender, non-distended . No hernias, masses, or organomegaly . Normoactive bowel sounds.  Musculoskeletal:  . No cyanosis . No clubbing . No edema Skin:  . No rashes, lesions, ulcers . palpation of skin: no induration or nodules Neurologic:  . CN 2-12 intact . Sensation all 4 extremities intact Psychiatric:  . Mental status o Mood, affect appropriate o Orientation to person, place, time  . judgment and insight appear intact  I have personally reviewed the following:   Today's Data  . Vitals . BMP . CBC .  Micro Data  . Urine culture (06/09/2019): Insignificant growth . Urine Culture (06/01/2019): Klebsiella pneumoniae  Imaging  . CXR (06/09/2019): pulmonary edema . CT of abdominal/pelvis: Ascites and cirrhosis of the liver.   Scheduled Meds: . amiodarone  200 mg Oral BID  . apixaban  2.5 mg Oral BID  . atorvastatin  40 mg Oral q1800  . carvedilol  3.125 mg Oral BID WC  . feeding supplement (ENSURE ENLIVE)  237 mL Oral Q24H  . ferrous gluconate  324 mg Oral BID  . lactulose  20 g Oral BID  . mexiletine  200 mg Oral BID  . pantoprazole  40 mg Oral Daily  . sodium chloride flush  3 mL Intravenous Q12H  . sucralfate  1 g Oral TID WC & HS  . torsemide  40 mg Oral Daily   Continuous Infusions: . sodium chloride      Active Problems:   Hypertension   Nonischemic cardiomyopathy (  [place])   Acute on chronic combined systolic and diastolic CHF (congestive heart failure) (HCC)   PAF (paroxysmal atrial fibrillation) ([place])   ICD (implantable cardioverter-defibrillator), dual, [facility]   HLD (hyperlipidemia)   CKD (chronic kidney disease), stage III   Symptomatic anemia   Acute on chronic combined systolic and diastolic heart failure (HCC)   CHF exacerbation (HCC)   NSVT (nonsustained ventricular tachycardia) (HCC)   Hypoalbuminemia   Leukocytosis   Elevated troponin   DNR (do not resuscitate)    Adult failure to thrive   LOS: 6 days   A & P  Acute on chronic combined CHF: The patient has been admitted to a telemetry bed. Serial EKG's have been followed for ischemia. Troponins elevated, but trended down 31-28.  They are chronically elevated and stable. BNP elevated at 2039.7. Cardiology has been consulted.  Palliative care consult has also been called. She is now a DNR. Family and patient are not yet ready for hospice. The patient has a an AICD/PPM (dual chamber). Palliative care is determining if their wish for DNR/DNI means that they want that turned off. Dr. Tempie Hoist  feels that the best plan for the patient would be SNF and hospice. The patient and family reject both. However, I have attempted to reach the family to discuss discharge of this patient who is total care to home. Neither the daughter nor the grandson have answered the phone. Cardiology has signed off.   Paroxysmal Atrial Fibrillation: The patient is being anticoagulated with Eliquis due to a CHADSVASC2 score of 5. Her rate and rhythm are under good control with Coreg and amiodarone. These will be continued.  Hyperlipidemia: Continue statin as at home.  Chronic Kidney Disease III: Monitor electrolytes, creatinine, and volume status. Creatinine today is 1.90.  Cirrhosis of the liver with ascites: Patient is receiving lactulose. Monitor.   Symptomatic anemia: Likely multifactorial (CKD and iron deficiency).  Hypoalbuminemia: Noted. Consider use of albumin to improve diuresis. Monitor.  Hypertension: Blood pressures are well controlled on coreg.  I have seen and examined this patient myself. I have spent 38 minutes in her evaluation and care.  DVT prophylaxis: Eliquis CODE STATUS: DNR Family Communication: I have attempted to reach the family to discuss discharge of this patient who is total care to home. Neither the daughter nor the grandson have answered the phone.  Disposition: No safe discharge at this time. SNF vs  discharge to home with 24/7 supervision. She is total care. Social work has been reconsulted to arrange safe discharge.  Nole Robey, DO [facility] Direct contact: see [email or web address]  7PM-7AM contact night coverage as above 06/15/2019, 4:21 PM  LOS: 3 days

## 2019-06-15 NOTE — Progress Notes (Signed)
Occupational Therapy Treatment Patient Details Name: TERIANN LIVINGOOD MRN: [id number] DOB: 08-Feb-1946 Today's Date: 06/15/2019    History of present illness 73yo female with recent hospitalization due to CHF exacerbation and fluid overload; during this stay had a neurological event (possible CVA vs seizures), and was ultimately discharged to SNF. SHe now returns from SNF due to increased lethargy and increased SOB. PMH chornic anemia, CHF, CKD stage IV, HLD, HTN, A-fib, mitral regurgitation, V-tach s/p AICD/PPM, ongoing GIB   OT comments  Pt. Seen for skilled OT treatment.  Reports having upset stomach but interested in sitting up.  Bed mobility eob/back to bed mod a.  Pt. Fatigued quickly and requested to lay back down.  Tolerated eob sitting approx. 5 min. With intermittent assistance provided  as pt. Unable to maintain un supported sitting.   Follow Up Recommendations  SNF;Supervision/Assistance - 24 hour    Equipment Recommendations  Other (comment)    Recommendations for Other Services      Precautions / Restrictions Precautions Precautions: Fall Precaution Comments: grossly weak, fatigues quickly Restrictions Weight Bearing Restrictions: No       Mobility Bed Mobility Overal bed mobility: Needs Assistance Bed Mobility: Supine to Sit;Sit to Supine     Supine to sit: Mod assist Sit to supine: Mod assist;Max assist   General bed mobility comments: focused on sitting eob and trying to be b feet on the floor to aide in sitting balance. pt. able to initially sit un supported but noted to require mod support stating she thinks she needs to lie back  Transfers                      Balance                                           ADL either performed or assessed with clinical judgement   ADL Overall ADL's : Needs assistance/impaired Eating/Feeding: Moderate assistance;Sitting Eating/Feeding Details (indicate cue type and reason): asking for  assistance to drink water from cup. attempts x1 to hold cup but could not bring fully to mouth without assistance                                         Vision       Perception     Praxis      Cognition Arousal/Alertness: Awake/alert                                              Exercises     Shoulder Instructions       General Comments      Pertinent Vitals/ Pain       Pain Assessment: Faces Pain Location: stomach upset, states she is very nauseaous Pain Descriptors / Indicators: Discomfort  Home Living                                          Prior Functioning/Environment              Frequency  Min 2X/week  Progress Toward Goals  OT Goals(current goals can now be found in the care plan section)  Progress towards OT goals: Progressing toward goals     Plan      Co-evaluation                 AM-PAC OT "6 Clicks" Daily Activity     Outcome Measure   Help from another person eating meals?: A Lot Help from another person taking care of personal grooming?: A Lot Help from another person toileting, which includes using toliet, bedpan, or urinal?: Total Help from another person bathing (including washing, rinsing, drying)?: A Lot Help from another person to put on and taking off regular upper body clothing?: A Lot Help from another person to put on and taking off regular lower body clothing?: Total 6 Click Score: 10    End of Session    OT Visit Diagnosis: Muscle weakness (generalized) (M62.81);Other abnormalities of gait and mobility (R26.89);Other symptoms and signs involving cognitive function   Activity Tolerance Patient limited by fatigue;Patient limited by lethargy   Patient Left in bed;with call bell/phone within reach;with bed alarm set   Nurse Communication Other (comment)(notified cna of need for new pure wik placement)        Time: 6886-4847 OT Time Calculation  (min): 10 min  Charges: OT General Charges $OT Visit: 1 Visit OT Treatments $Self Care/Home Management : 8-22 mins   Tanya Nones, COTA/L 06/15/2019, 9:57 AM

## 2019-06-16 LAB — CBC WITH DIFFERENTIAL/PLATELET
Abs Immature Granulocytes: 0.09 10*3/uL — ABNORMAL HIGH (ref 0.00–0.07)
Basophils Absolute: 0.1 10*3/uL (ref 0.0–0.1)
Basophils Relative: 0 %
Eosinophils Absolute: 0.2 10*3/uL (ref 0.0–0.5)
Eosinophils Relative: 1 %
HCT: 23.5 % — ABNORMAL LOW (ref 36.0–46.0)
Hemoglobin: 7.7 g/dL — ABNORMAL LOW (ref 12.0–15.0)
Immature Granulocytes: 1 %
Lymphocytes Relative: 11 %
Lymphs Abs: 1.8 10*3/uL (ref 0.7–4.0)
MCH: 25 pg — ABNORMAL LOW (ref 26.0–34.0)
MCHC: 32.8 g/dL (ref 30.0–36.0)
MCV: 76.3 fL — ABNORMAL LOW (ref 80.0–100.0)
Monocytes Absolute: 1.4 10*3/uL — ABNORMAL HIGH (ref 0.1–1.0)
Monocytes Relative: 9 %
Neutro Abs: 12.4 10*3/uL — ABNORMAL HIGH (ref 1.7–7.7)
Neutrophils Relative %: 78 %
Platelets: 259 10*3/uL (ref 150–400)
RBC: 3.08 MIL/uL — ABNORMAL LOW (ref 3.87–5.11)
RDW: 17.5 % — ABNORMAL HIGH (ref 11.5–15.5)
WBC: 15.7 10*3/uL — ABNORMAL HIGH (ref 4.0–10.5)
nRBC: 0 % (ref 0.0–0.2)

## 2019-06-16 LAB — COMPREHENSIVE METABOLIC PANEL
ALT: 30 U/L (ref 0–44)
AST: 63 U/L — ABNORMAL HIGH (ref 15–41)
Albumin: 1.6 g/dL — ABNORMAL LOW (ref 3.5–5.0)
Alkaline Phosphatase: 112 U/L (ref 38–126)
Anion gap: 10 (ref 5–15)
BUN: 28 mg/dL — ABNORMAL HIGH (ref 8–23)
CO2: 27 mmol/L (ref 22–32)
Calcium: 8.1 mg/dL — ABNORMAL LOW (ref 8.9–10.3)
Chloride: 103 mmol/L (ref 98–111)
Creatinine, Ser: 1.96 mg/dL — ABNORMAL HIGH (ref 0.44–1.00)
GFR calc Af Amer: 28 mL/min — ABNORMAL LOW (ref >60)
GFR calc non Af Amer: 24 mL/min — ABNORMAL LOW (ref >60)
Glucose, Bld: 102 mg/dL — ABNORMAL HIGH (ref 70–99)
Potassium: 3.8 mmol/L (ref 3.5–5.1)
Sodium: 140 mmol/L (ref 135–145)
Total Bilirubin: 0.9 mg/dL (ref 0.3–1.2)
Total Protein: 6 g/dL — ABNORMAL LOW (ref 6.5–8.1)

## 2019-06-16 LAB — MAGNESIUM: Magnesium: 2 mg/dL (ref 1.7–2.4)

## 2019-06-16 NOTE — Progress Notes (Signed)
PROGRESS NOTE  Stephanie Yates UKG:[id number] DOB: [birth year] DOA: 06/09/2019 PCP: Josetta Huddle, MD  Brief History   73 y.o. female with medical history significant of CHF/NICM (EF 10 to 15%), VT s/p AICD 2014 and biventricular PPM, paroxysmal A. fib/bradycardia, chronic anemia, CKD-4, HTN, HLD, moderate protein calorie malnutrition and GIB, prolonged QT/torsade and multiple hospitalizations Admitted for acute on chronic combined systolic diastolic CHF exacerbation. Has been seen by HF team and had conversation with Palliative care along with her family yesterday afternoon.  The patient and her family have decided to make her a DNR, but they are not yet ready for hospice. She does not want a feeding tube under any circumstances. She states that she would not want "intense aggressive measures". Palliative Care is addressing whether or not the family wishes to have AICD turned off inline with their wishes for DNR/DNI.  The patient had a run of VT on the evening of 06/15/2019 for which she was asymptomatic.   I have attempted to reach the patient's daughter and grandson listed as emergency contacts. There is no answer. Social work has been consulted for SNF placement which is recommended by PT/OT. The patient is medically cleared for discharge, but no safe discharge is known for this elderly, frail, and debilitated patient.   Consultants  Heart Failure Team Palliative Care  Procedures  . None  Antibiotics   Anti-infectives (From admission, onward)   None     Subjective  The patient is resting comfortably. No new complaints.  Objective   Vitals:  Vitals:   06/16/19 0505 06/16/19 1000  BP: 108/61 (!) 113/50  Pulse: 60 60  Resp: 13 14  Temp: 98.6 F (37 C)   SpO2: 100% 100%   Exam:  Constitutional:  . The patient is awake and alert. No acute distress. Respiratory:  . No increased work of breathing. . No wheezes or rhonchi . Small rales at right base . No tactile  fremitus Cardiovascular:  . Regular rate and rhythm . No murmurs, ectopy, or gallups. . No lateral PMI. No thrills. Abdomen:  . Abdomen is soft, non-tender, non-distended . No hernias, masses, or organomegaly . Normoactive bowel sounds.  Musculoskeletal:  . No cyanosis . No clubbing . No edema Skin:  . No rashes, lesions, ulcers . palpation of skin: no induration or nodules Neurologic:  . CN 2-12 intact . Sensation all 4 extremities intact Psychiatric:  . Mental status o Mood, affect appropriate o Orientation to person, place, time  . judgment and insight appear intact  I have personally reviewed the following:   Today's Data  . Vitals . BMP . CBC .  Micro Data  . Urine culture (06/09/2019): Insignificant growth . Urine Culture (06/01/2019): Klebsiella pneumoniae  Imaging  . CXR (06/09/2019): pulmonary edema . CT of abdominal/pelvis: Ascites and cirrhosis of the liver.   Scheduled Meds: . amiodarone  200 mg Oral BID  . apixaban  2.5 mg Oral BID  . atorvastatin  40 mg Oral q1800  . carvedilol  3.125 mg Oral BID WC  . feeding supplement (ENSURE ENLIVE)  237 mL Oral Q24H  . ferrous gluconate  324 mg Oral BID  . lactulose  20 g Oral BID  . mexiletine  200 mg Oral BID  . pantoprazole  40 mg Oral Daily  . sodium chloride flush  3 mL Intravenous Q12H  . sucralfate  1 g Oral TID WC & HS  . torsemide  40 mg Oral Daily   Continuous Infusions: .  sodium chloride      Active Problems:   Hypertension   Nonischemic cardiomyopathy (HCC)   Acute on chronic combined systolic and diastolic CHF (congestive heart failure) (HCC)   PAF (paroxysmal atrial fibrillation) ([place])   ICD (implantable cardioverter-defibrillator), dual, [facility]   HLD (hyperlipidemia)   CKD (chronic kidney disease), stage III   Symptomatic anemia   Acute on chronic combined systolic and diastolic heart failure (HCC)   CHF exacerbation (HCC)   NSVT (nonsustained ventricular tachycardia) (HCC)    Hypoalbuminemia   Leukocytosis   Elevated troponin   DNR (do not resuscitate)   Adult failure to thrive   LOS: 7 days   A & P  Acute on chronic combined CHF: The patient has been admitted to a telemetry bed. Serial EKG's have been followed for ischemia. Troponins elevated, but trended down 31-28.  They are chronically elevated and stable. BNP elevated at 2039.7. Cardiology has been consulted.  Palliative care consult has also been called. She is now a DNR. Family and patient are not yet ready for hospice. The patient has a an AICD/PPM (dual chamber). Palliative care is determining if their wish for DNR/DNI means that they want that turned off. Dr. Tempie Hoist  feels that the best plan for the patient would be SNF and hospice. The patient and family reject both. However, I have attempted to reach the family to discuss discharge of this patient who is total care to home. Neither the daughter nor the grandson have answered the phone. Cardiology has signed off.   Paroxysmal Atrial Fibrillation: The patient is being anticoagulated with Eliquis due to a CHADSVASC2 score of 5. Her rate and rhythm are under good control with Coreg and amiodarone. These will be continued.  Hyperlipidemia: Continue statin as at home.  Chronic Kidney Disease III: Monitor electrolytes, creatinine, and volume status. Creatinine today is 1.90.  VT: Pt had 22 beats of VT on the evening of 06/15/2019. She was asymptomatic. Will check magnesium today.  Cirrhosis of the liver with ascites: Patient is receiving lactulose. Monitor.   Symptomatic anemia: Likely multifactorial (CKD and iron deficiency).  Hypoalbuminemia: Noted. Consider use of albumin to improve diuresis. Monitor.  Hypertension: Blood pressures are well controlled on coreg.  I have seen and examined this patient myself. I have spent 32 minutes in her evaluation and care.  DVT prophylaxis: Eliquis CODE STATUS: DNR Family Communication: I have attempted to reach  the family to discuss discharge of this patient who is total care to home. Neither the daughter nor the grandson have answered the phone.  Disposition: No safe discharge at this time. SNF vs discharge to home with 24/7 supervision. She is total care. Social work has been reconsulted to arrange safe discharge.  Kambria Grima, DO [facility] Direct contact: see [email or web address]  7PM-7AM contact night coverage as above 06/16/2019, 2:37 PM  LOS: 3 days

## 2019-06-16 NOTE — Progress Notes (Signed)
Pt c/o of nausea, clear emesis, unable to take PO for now. Zofran given. Will continue to monitor.

## 2019-06-16 NOTE — Progress Notes (Signed)
4 mins of intermittent v-tach at 22:00. Pt asymptomatic. EKG in chart. On call provider notified. Will continue to monitor

## 2019-06-17 DIAGNOSIS — I5023 Acute on chronic systolic (congestive) heart failure: Secondary | ICD-10-CM

## 2019-06-17 DIAGNOSIS — E782 Mixed hyperlipidemia: Secondary | ICD-10-CM

## 2019-06-17 DIAGNOSIS — D72828 Other elevated white blood cell count: Secondary | ICD-10-CM

## 2019-06-17 LAB — BASIC METABOLIC PANEL
Anion gap: 9 (ref 5–15)
BUN: 31 mg/dL — ABNORMAL HIGH (ref 8–23)
CO2: 27 mmol/L (ref 22–32)
Calcium: 8.1 mg/dL — ABNORMAL LOW (ref 8.9–10.3)
Chloride: 102 mmol/L (ref 98–111)
Creatinine, Ser: 1.87 mg/dL — ABNORMAL HIGH (ref 0.44–1.00)
GFR calc Af Amer: 30 mL/min — ABNORMAL LOW (ref >60)
GFR calc non Af Amer: 26 mL/min — ABNORMAL LOW (ref >60)
Glucose, Bld: 82 mg/dL (ref 70–99)
Potassium: 4 mmol/L (ref 3.5–5.1)
Sodium: 138 mmol/L (ref 135–145)

## 2019-06-17 MED ORDER — MECLIZINE HCL 12.5 MG PO TABS: 12.5000 mg | ORAL_TABLET | Freq: Two times a day (BID) | ORAL | 0 refills | Status: DC | PRN

## 2019-06-17 MED ORDER — TORSEMIDE 20 MG PO TABS: 40.0000 mg | ORAL_TABLET | Freq: Every day | ORAL | 0 refills | Status: DC

## 2019-06-17 MED ORDER — MECLIZINE HCL 25 MG PO TABS: 12.5000 mg | ORAL_TABLET | Freq: Two times a day (BID) | ORAL | Status: DC | PRN

## 2019-06-17 MED FILL — MECLIZINE 12.5 MG CAPLET: 12.5 | 15 days supply | Qty: 30 | Fill #0

## 2019-06-17 MED FILL — TORSEMIDE 20 MG TABLET: 20 | 30 days supply | Qty: 60 | Fill #0

## 2019-06-17 NOTE — Progress Notes (Signed)
Pt's being DC'ed home with family, TOC med delivered, discharge paper handed to pt's grandson.

## 2019-06-17 NOTE — Discharge Summary (Addendum)
Physician Discharge Summary  Stephanie Yates VWU:[id number] DOB: 1945-07-19 DOA: 06/09/2019  PCP: Josetta Huddle, MD  Admit date: 06/09/2019 Discharge date: 06/17/2019  Recommendations for Outpatient Follow-up:  1. The patient will be discharged to Home with home health PT/OT and RN 2. Follow up with PCP in 7-10 days after discharge from SNF.  3. Follow up with heart failure team as directed. 4. Weigh yourself daily and record values.  Follow-up Information    [facility] up.   Specialty: Home Health Services Why: Physical Therapy, Occupational Therapy, and Nurse Contact information: [place] [address] [phone]          Discharge Diagnoses: Principal diagnosis is #1 1. Acute on chronic combined CHF 2. Paroxysmal atrial fibrillation 3. Hyperlipidemia 4. CKD III 5. Ventricular tachycardia - Resolved 6. Cirrhosis of the liver with ascites 7. Symptomatic anemia - multifactorial 8. Hypoalbuminemia 9. Hypertension 10. Vertigo  Discharge Condition: Fair  Disposition: Home with home health PT/OT/RN  Diet recommendation: Heart healthy  Filed Weights   06/15/19 0448 06/16/19 0505 06/17/19 0347  Weight: 58.4 kg 58.6 kg 57.3 kg    History of present illness: Stephanie Yates is a 73 y.o. female with medical history significant of CHF/NICM (EF 10 to 15%), VT s/p AICD in 2014 and biventricular PPM, paroxysmal A. fib/bradycardia, chronic anemia, CKD-4, HTN, HLD, moderate protein calorie malnutrition and GIB, prolonged QT/torsade and multiple hospitalizations  She presented to the ED with increased shortness of breath and lethargy patient recently discharged from hospital. [facility] a prolonged hospital stay initially admitted on 29 November for what appears to be fluid overload.  Secondary to CHF exacerbation. FOBT was found to be positive she have been evaluated by Dr. Charlesetta Ivory of [facility] given recurrent EGDs and colonoscopies in the  past at that time further studies will hold off.  GI recommended as needed blood and iron transfusion.  Given her history of heart failure heart failure team was consulted patient was diuresed with IV Lasix undergone right heart catheterization on 3 December which showed moderately elevated filling pressures with normal cardiac output.  She has had multiple neurological events with multiple CT scans of the head that showed no evidence of acute CVA she was not a candidate for MRI given history of AICD for history of V. Tach. Eventually thought that her neurological abnormalities which include lethargy and occasional dizziness dysarthria left-sided arm weakness possibly secondary to seizure activity.  EEG was done and is appear to form discharge.  But it was found that her ammonia was elevated to 105 she was treated with lactulose and seemed to have improved.  Unfortunately her left-sided weakness have recurred and she had to have another EEG done still showing no epileptic activity but just diffuse moderate encephalopathy.  She was assessed by PT OT and recommended initial home health but patient was reassessed and PT felt that she would better benefit from SNF. she was discharged on 11 December  To [facility]F.  Patient has known history of intermittent GI blood loss her Eliquis was held at the time of discharge he was restarted.  Hospital Course:  73 y.o. female with medical history significant of CHF/NICM (EF 10 to 15%), VT s/p AICD 2014 and biventricular PPM, paroxysmal A. fib/bradycardia, chronic anemia, CKD-4, HTN, HLD, moderate protein calorie malnutrition and GIB, prolonged QT/torsade and multiple hospitalizations Admitted for acute on chronic combined systolic diastolic CHF exacerbation.Has been seen by HF team and had conversation with Palliative  care along with her family yesterday afternoon.  The patient and her family have decided to make her a DNR, but they are not yet ready for  hospice. She does not want a feeding tube under any circumstances. She states that she would not want "intense aggressive measures". Palliative Care is addressing whether or not the family wishes to have AICD turned off inline with their wishes for DNR/DNI.  The patient had a run of VT on the evening of 06/15/2019 for which she was asymptomatic.   The patient will be discharged to home with home health PT/OT/RN and a walker.  Today's assessment: S: The patient is resting quietly. She is complaining of dizziness when she gets up, especially in the morning. She states that it tends to get better as the day goes on . This is not a new complaint. O: Vitals:  Vitals:   06/17/19 0839 06/17/19 1124  BP: (!) 101/54   Pulse:  60  Resp: 17 17  Temp:    SpO2:  100%   Constitutional:   The patient is awake and alert. No acute distress. Respiratory:   No increased work of breathing.  No wheezes or rhonchi  Small rales at right base  No tactile fremitus Cardiovascular:   Regular rate and rhythm  No murmurs, ectopy, or gallups.  No lateral PMI. No thrills. Abdomen:   Abdomen is soft, non-tender, non-distended  No hernias, masses, or organomegaly  Normoactive bowel sounds.  Musculoskeletal:   No cyanosis  No clubbing  No edema Skin:   No rashes, lesions, ulcers  palpation of skin: no induration or nodules Neurologic:   CN 2-12 intact  Sensation all 4 extremities intact Psychiatric:   Mental status ? Mood, affect appropriate ? Orientation to person, place, time   judgment and insight appear intact  Discharge Instructions  Discharge Instructions    For home use only DME Hospital bed   Complete by: As directed    Length of Need: Lifetime   The above medical condition requires: Patient requires the ability to reposition frequently   Head must be elevated greater than: 30 degrees   Bed type: Semi-electric   Hoyer Lift: Yes      Allergies  Allergen  Reactions  . Strawberry Extract Hives, Itching and Swelling        Allergies as of 06/17/2019      Reactions   Strawberry Extract Hives, Itching, Swelling         Medication List    STOP taking these medications   bisacodyl 10 MG suppository Commonly known as: DULCOLAX   FLEET ENEMA RE   furosemide 80 MG tablet Commonly known as: LASIX   MILK OF MAGNESIA PO   nitroGLYCERIN 0.4 MG SL tablet Commonly known as: NITROSTAT   senna 8.6 MG Tabs tablet Commonly known as: SENOKOT   senna-docusate 8.6-50 MG tablet Commonly known as: Senokot-S   sucralfate 1 GM/10ML suspension Commonly known as: CARAFATE   Vitamin D-3 25 MCG (1000 UT) Caps     TAKE these medications   acetaminophen 500 MG tablet Commonly known as: TYLENOL Take 1,000 mg by mouth every 6 (six) hours as needed (pain).   albuterol 108 (90 Base) MCG/ACT inhaler Commonly known as: VENTOLIN HFA Inhale 2 puffs into the lungs every 4 (four) hours as needed for wheezing or shortness of breath.   amiodarone 200 MG tablet Commonly known as: Pacerone Take 1 tablet (200 mg total) by mouth 2 (two) times daily.  apixaban 2.5 MG Tabs tablet Commonly known as: ELIQUIS Take 1 tablet (2.5 mg total) by mouth 2 (two) times daily.   atorvastatin 40 MG tablet Commonly known as: LIPITOR Take 1 tablet (40 mg total) by mouth daily at 6 PM.   carvedilol 3.125 MG tablet Commonly known as: COREG Take 1 tablet (3.125 mg total) by mouth 2 (two) times daily with a meal.   Ensure Take 237 mLs by mouth every morning.   ferrous gluconate 324 MG tablet Commonly known as: FERGON Take 324 mg by mouth 2 (two) times daily.   lactulose 20 g packet Commonly known as: CEPHULAC Take 1 packet (20 g total) by mouth 2 (two) times daily. Hold for 3 or more than 3 bowel movements per day.   magnesium oxide 400 (241.3 Mg) MG tablet Commonly known as: MAGnesium-Oxide Take 1 tablet (400 mg total) by mouth daily. What changed: when to  take this   meclizine 12.5 MG tablet Commonly known as: ANTIVERT Take 1 tablet (12.5 mg total) by mouth 2 (two) times daily as needed for dizziness.   mexiletine 200 MG capsule Commonly known as: MEXITIL Take 1 capsule (200 mg total) by mouth 2 (two) times daily.   ondansetron 4 MG disintegrating tablet Commonly known as: Zofran ODT Take 1 tablet (4 mg total) by mouth every 8 (eight) hours as needed for nausea or vomiting.   pantoprazole 40 MG tablet Commonly known as: Protonix Take 1 tablet (40 mg total) by mouth daily.   polyethylene glycol 17 g packet Commonly known as: MIRALAX / GLYCOLAX Take 17 g by mouth daily. Can take up to twice a day for constipation. What changed:   when to take this  additional instructions   torsemide 20 MG tablet Commonly known as: DEMADEX Take 2 tablets (40 mg total) by mouth daily. Start taking on: June 18, 2019            Durable Medical Equipment  (From admission, onward)         Start     Ordered   06/17/19 0000  For home use only DME Hospital bed    Question Answer Comment  Length of Need Lifetime   The above medical condition requires: Patient requires the ability to reposition frequently   Head must be elevated greater than: 30 degrees   Bed type Semi-electric   Hoyer Lift Yes      06/17/19 1227   06/11/19 1045  For home use only DME 4 wheeled rolling walker with seat  Once    Question:  Patient needs a walker to treat with the following condition  Answer:  Heart failure ([place])   06/11/19 1044           The results of significant diagnostics from this hospitalization (including imaging, microbiology, ancillary and laboratory) are listed below for reference.    Significant Diagnostic Studies: CT ABDOMEN PELVIS WO CONTRAST  Result Date: 06/14/2019 CLINICAL DATA:  Acute generalized abdominal pain. EXAM: CT ABDOMEN AND PELVIS WITHOUT CONTRAST TECHNIQUE: Multidetector CT imaging of the abdomen and pelvis was  performed following the standard protocol without IV contrast. COMPARISON:  06/01/2019 FINDINGS: Lower chest: Mild bibasilar atelectasis. Hepatobiliary: Findings suspicious for hepatic cirrhosis are again demonstrated. No liver masses identified on this unenhanced exam. Gallbladder is unremarkable. No evidence of biliary ductal dilatation. Pancreas: No mass or inflammatory process visualized on this unenhanced exam. Spleen:  Within normal limits in size. Adrenals/Urinary tract: Stable 3.5 cm low-attenuation right adrenal mass, consistent  with benign adenoma. A few tiny less than 5 mm renal calculi are again seen bilaterally, however there is no evidence of ureteral calculi or hydronephrosis. Unremarkable unopacified urinary bladder. Stomach/Bowel: No evidence of obstruction, inflammatory process, or abnormal fluid collections. Diverticulosis is seen mainly involving the descending and sigmoid colon, however there is no evidence of diverticulitis. Vascular/Lymphatic: No pathologically enlarged lymph nodes identified. No evidence of abdominal aortic aneurysm. Aortic atherosclerosis incidentally noted. Reproductive: Small uterine fibroids some of which are calcified are again noted. Adnexal regions are unremarkable. Other: Diffuse mesenteric and body wall edema, and mild ascites, have mildly increased since previous study. Musculoskeletal:  No suspicious bone lesions identified. IMPRESSION: Mild increase in mild ascites and diffuse mesenteric and body wall edema since prior study. No focal inflammatory process or abscess identified. Probable hepatic cirrhosis. Colonic diverticulosis, without radiographic evidence of diverticulitis. Bilateral nephrolithiasis. No evidence of ureteral calculi or hydronephrosis. Stable small uterine fibroids. Stable benign right adrenal adenoma. Aortic Atherosclerosis (ICD10-I70.0). Electronically Signed   By: Marlaine Hind M.D.   On: 06/14/2019 18:21   CT ABDOMEN PELVIS WO  CONTRAST  Result Date: 06/01/2019 CLINICAL DATA:  Abdominal pain.  Concern for diverticulitis. EXAM: CT ABDOMEN AND PELVIS WITHOUT CONTRAST TECHNIQUE: Multidetector CT imaging of the abdomen and pelvis was performed following the standard protocol without IV contrast. COMPARISON:  05/26/2019 FINDINGS: The lack of intravenous contrast limits the ability to evaluate solid abdominal organs. Lower chest: Limited visualization of lower thorax demonstrates minimal bibasilar heterogeneous opacities, left greater than right, improved compared to the 11/29 examination. No pleural effusion. Cardiomegaly. Pacer leads again terminate within the right atrium and ventricle. No pericardial effusion. Hepatobiliary: Nodularity hepatic contour. There is diffuse decreased attempt increased attenuation of the hepatic parenchyma as could be seen in the setting of chronic amiodarone therapy. Trace amount of perihepatic ascites, unchanged to slightly progressed compared to the 05/26/2019 examination. Normal noncontrast appearance of the gallbladder. No radiopaque gallstones. Pancreas: Normal noncontrast appearance of the pancreas. Spleen: Normal noncontrast appearance of the spleen. Adrenals/Urinary Tract: The bilateral kidneys again appear atrophic. Punctate nonobstructing renal stones are again seen bilaterally with dominant nonobstructing stone within the inferior pole the right kidney measuring approximately 4 mm in diameter (image 38, series 3) and dominant nonobstructing stone within the inferior pole the left kidney measuring approximately 2 mm (image 36, series 3). No renal stones are seen along the expected course of either ureter. Excreted contrast from previous contrast enhance head and neck CTA is seen within the urinary bladder. No urinary obstruction or perinephric stranding. Redemonstrated approximately 3.8 x 1.9 cm hypoattenuating right-sided adrenal adenoma. Normal noncontrast appearance of the left adrenal gland.  Stomach/Bowel: Ingested enteric contrast extends to the level of the hepatic flexure of the colon. Redemonstrated small right-sided inguinal hernia which is noted to contain a short-segment of nondilated small bowel (representative coronal images 39 through 42, series 6), not resulting in enteric obstruction. Moderate colonic stool burden. Scattered colonic diverticulosis without evidence of superimposed acute diverticulitis. Normal appearance of the terminal ileum. The appendix is not visualized, however there is no definitive pericecal inflammatory change on this noncontrast examination. No pneumoperitoneum, pneumatosis or portal venous gas. Vascular/Lymphatic: Atherosclerotic plaque within normal caliber abdominal aorta. No bulky retroperitoneal, mesenteric, pelvic or inguinal lymphadenopathy. Reproductive: Dystrophic calcifications, presumed degenerating fibroids within and expectedly atrophic uterus. No discrete adnexal lesion. Trace amount of fluid in the pelvic cul-de-sac. Other: Moderate amount of diffuse body wall anasarca potentially secondary to third spacing. Musculoskeletal: No acute or aggressive  osseous abnormalities. Grade 1 anterolisthesis of L4 upon L5 and L5 upon S1 without associated pars defects. A bone island is noted within the left acetabulum. IMPRESSION: 1. No explanation for patient's abdominal pain. Specifically, no evidence enteric obstruction or diverticulitis on this noncontrast examination. 2. Redemonstrated right inguinal hernia containing nondilated loops of small bowel and not resulting in enteric obstruction. 3. Nonobstructive bilateral nephrolithiasis unchanged. 4. Cardiomegaly with diffuse body wall anasarca as could be seen in the setting early CHF. Clinical correlation is advised. 5. Suspected early cirrhotic change with minimal amount perihepatic ascites. 6.  Aortic Atherosclerosis (ICD10-I70.0). Electronically Signed   By: Sandi Mariscal M.D.   On: 06/01/2019 12:08   CT  ABDOMEN PELVIS WO CONTRAST  Result Date: 05/26/2019 CLINICAL DATA:  73 year old female with history of abdominal distension. Diffuse abdominal pain, most severe on the right side. EXAM: CT ABDOMEN AND PELVIS WITHOUT CONTRAST TECHNIQUE: Multidetector CT imaging of the abdomen and pelvis was performed following the standard protocol without IV contrast. COMPARISON:  CT the abdomen and pelvis 03/17/2019. FINDINGS: Lower chest: Mild scarring in the visualized lung bases. Cardiomegaly. Atherosclerotic calcifications in the left anterior descending, left circumflex and right coronary arteries. Pacemaker leads terminating in the right atrium and right ventricular apex. Hepatobiliary: Liver has a slightly shrunken appearance and nodular contour, suggesting underlying cirrhosis. No definite suspicious cystic or solid hepatic lesions are confidently identified on today's noncontrast CT examination. Unenhanced appearance of the gallbladder is normal. Pancreas: No definite pancreatic mass or peripancreatic fluid collections or inflammatory changes are noted on today's noncontrast CT examination. Spleen: Unremarkable. Adrenals/Urinary Tract: Multiple nonobstructive calculi are noted within the collecting systems of both kidneys, largest of which measures up to 4 mm in the lower pole collecting system of the right kidney. No additional calculi are confidently identified along the course of either ureter or within the lumen of the urinary bladder. No hydroureteronephrosis. Unenhanced appearance of the kidneys is otherwise normal. Left adrenal gland is normal in appearance. 4.5 x 2.1 cm low-attenuation (-27 HU) right adrenal nodule, similar to prior studies, compatible with an adrenal myelolipoma. Unenhanced appearance of the urinary bladder is normal. Stomach/Bowel: Unenhanced appearance of the stomach is normal. No pathologic dilatation of small bowel or colon. Multiple loops of small bowel appear to extend into a large right  inguinal hernia. The appendix is not confidently identified and may be surgically absent. Regardless, there are no inflammatory changes noted adjacent to the cecum to suggest the presence of an acute appendicitis at this time. Numerous colonic diverticulae are noted, without surrounding inflammatory changes to suggest an acute diverticulitis at this time. Vascular/Lymphatic: Aortic atherosclerosis. No lymphadenopathy noted in the abdomen or pelvis confidently identified on today's noncontrast CT examination. Reproductive: Uterus is markedly heterogeneous in appearance with multiple coarse calcifications, compatible with a fibroid uterus. Ovaries are unremarkable in appearance. Other: Trace volume of ascites.  No pneumoperitoneum. Musculoskeletal: There are no aggressive appearing lytic or blastic lesions noted in the visualized portions of the skeleton. IMPRESSION: 1. Large right inguinal hernia containing several loops of small bowel, without evidence of bowel incarceration or obstruction. 2. Numerous small nonobstructive calculi are noted in the collecting systems of both kidneys measuring up to 4 mm in the lower pole. No ureteral stones or findings of urinary tract obstruction are noted at this time. 3. Colonic diverticulosis without evidence of acute diverticulitis at this time. 4. Severe cardiomegaly. 5. Aortic atherosclerosis, in addition to least 3 vessel coronary artery disease. Assessment for potential risk  factor modification, dietary therapy or pharmacologic therapy may be warranted, if clinically indicated. 6. Morphologic changes in the liver suggestive of underlying cirrhosis. 7. Additional incidental findings, as above. Electronically Signed   By: Vinnie Langton M.D.   On: 05/26/2019 18:27   CT ANGIO HEAD W OR WO CONTRAST  Result Date: 05/31/2019 CLINICAL DATA:  Left-sided weakness EXAM: CT ANGIOGRAPHY HEAD AND NECK TECHNIQUE: Multidetector CT imaging of the head and neck was performed using the  standard protocol during bolus administration of intravenous contrast. Multiplanar CT image reconstructions and MIPs were obtained to evaluate the vascular anatomy. Carotid stenosis measurements (when applicable) are obtained utilizing NASCET criteria, using the distal internal carotid diameter as the denominator. CONTRAST:  161mL OMNIPAQUE IOHEXOL 350 MG/ML SOLN COMPARISON:  None. FINDINGS: CTA NECK FINDINGS Aortic arch: Great vessel origins are patent. Right carotid system: Common, internal, and external carotid arteries are patent. There is mild calcified plaque along the common carotid. Mild calcified plaque is present at the ICA origin, noting streak artifact through this region. There is no hemodynamically significant stenosis or evidence of dissection. Left carotid system: Common, internal, and external carotid arteries are patent. Mild calcified plaque is present along the common carotid. There is primarily calcified plaque at the bifurcation and ICA origin causing minimal stenosis, noting streak artifact through this region. There is no hemodynamically significant stenosis or evidence of dissection. Vertebral arteries: Dominant right vertebral artery is patent. Calcified plaque at its origin results in mild stenosis. Congenitally small caliber left vertebral artery is patent. Skeleton: Advanced degenerative changes of the cervical spine particularly from C4-C5 through C6-C7. Other neck: No neck mass or adenopathy. Diffuse paranasal sinus opacification with areas of hyperdensity likely reflecting inspissation with fungal colonization not excluded. Upper chest: No apical lung mass. Review of the MIP images confirms the above findings CTA HEAD FINDINGS Anterior circulation: Intracranial internal carotid arteries are patent with calcified plaque along cavernous and paraclinoid portions causing mild stenosis. Anterior and middle cerebral arteries are patent. Posterior circulation: Intracranial vertebral arteries  are patent. The left vertebral artery terminates as a PICA. Basilar artery is patent. Posterior cerebral arteries are patent. Posterior communicating arteries are present. Venous sinuses: Patent as permitted by contrast timing Anatomic variants: Fetal origin of the right PCA. Review of the MIP images confirms the above findings IMPRESSION: No proximal intracranial vessel occlusion. No occlusion or hemodynamically significant stenosis in the neck. Electronically Signed   By: Macy Mis M.D.   On: 05/31/2019 15:56   DG Chest 2 View  Result Date: 05/26/2019 CLINICAL DATA:  Patient with right-sided abdominal pain. History of CHF. EXAM: CHEST - 2 VIEW COMPARISON:  Chest radiograph 02/20/2019 FINDINGS: Multi lead AICD device overlies the left hemithorax. Leads are stable in position. Stable cardiomegaly. Tortuosity of the thoracic aorta. Heterogeneous opacities left lung base. Pulmonary vascular redistribution. Trace bilateral pleural effusions. Thoracic spine degenerative changes. IMPRESSION: Cardiomegaly with pulmonary vascular redistribution and mild interstitial edema. Heterogeneous opacities left lung base may represent atelectasis. Probable trace bilateral pleural effusions. Electronically Signed   By: Lovey Newcomer M.D.   On: 05/26/2019 20:01   DG Chest 2 View  Result Date: 05/23/2019 CLINICAL DATA:  Shortness of breath EXAM: CHEST - 2 VIEW COMPARISON:  Radiograph 05/05/2019 FINDINGS: Cardiomegaly is similar to slightly increased from comparison AP radiography. Pacer/defibrillator pack overlies the left chest wall leads in the cardiac apex and right atrium. There is a mildly increased interstitial opacity with fissural and septal thickening and indistinct pulmonary vascularity. No  consolidative opacity. Atelectatic changes in the lung bases. No visible pneumothorax or effusion. The osseous structures appear diffusely demineralized which may limit detection of small or nondisplaced fractures. No acute  osseous or soft tissue abnormality. Degenerative changes are present in the imaged spine and shoulders. IMPRESSION: 1. Cardiomegaly with mild interstitial pulmonary edema, similar to slightly increased from prior study. 2. Bibasilar atelectasis. No consolidation. Electronically Signed   By: Lovena Le M.D.   On: 05/23/2019 01:17   CT HEAD WO CONTRAST  Result Date: 06/04/2019 CLINICAL DATA:  Left-sided weakness EXAM: CT HEAD WITHOUT CONTRAST TECHNIQUE: Contiguous axial images were obtained from the base of the skull through the vertex without intravenous contrast. COMPARISON:  06/01/2019 FINDINGS: Brain: There is no acute intracranial hemorrhage, mass-effect, or edema. There is no new loss of gray-white differentiation. Patchy hypoattenuation in the supratentorial white matter is nonspecific but likely reflects stable chronic microvascular ischemic changes. There is no extra-axial fluid collection. Ventricles and sulci are stable in size and configuration. Vascular: There is atherosclerotic calcification at the skull base. Skull: Calvarium is unremarkable. Sinuses/Orbits: Persistent diffuse polypoid mucosal thickening with areas of increased density likely reflecting inspissation. Other: None. IMPRESSION: No significant change since 06/01/2019. Electronically Signed   By: Macy Mis M.D.   On: 06/04/2019 14:21   CT HEAD WO CONTRAST  Result Date: 06/01/2019 CLINICAL DATA:  Encephalopathy. Follow-up for question of left basal ganglia region stroke. EXAM: CT HEAD WITHOUT CONTRAST TECHNIQUE: Contiguous axial images were obtained from the base of the skull through the vertex without intravenous contrast. COMPARISON:  CT 05/31/2019, 05/30/2019 and 04/15/2019. FINDINGS: Brain: No acute finding. The brainstem and cerebellum appear normal. Cerebral hemispheres show mild chronic small-vessel ischemic change of the white matter. This includes an area of low-density in the anterior limb internal capsule on the left  which was present in October in therefore not acute. No sign of acute infarction, mass lesion, hemorrhage, hydrocephalus or extra-axial collection. Vascular: There is atherosclerotic calcification of the major vessels at the base of the brain. Skull: Negative Sinuses/Orbits: Widespread sinus opacification consistent with rhinosinusitis. Orbits negative. Other: None IMPRESSION: No acute intracranial finding. Chronic small-vessel ischemic changes of the white matter. Low-density in the anterior limb internal capsule on the left question as acute on the recent study was, I think, present on 04/15/2019 and therefore not acute. Widespread rhinosinusitis. Electronically Signed   By: Nelson Chimes M.D.   On: 06/01/2019 11:12   CT HEAD WO CONTRAST  Result Date: 05/30/2019 CLINICAL DATA:  Vertigo EXAM: CT HEAD WITHOUT CONTRAST TECHNIQUE: Contiguous axial images were obtained from the base of the skull through the vertex without intravenous contrast. COMPARISON:  04/15/2019 FINDINGS: Brain: No acute intracranial abnormality. Specifically, no hemorrhage, hydrocephalus, mass lesion, acute infarction, or significant intracranial injury. Vascular: No hyperdense vessel or unexpected calcification. Skull: No acute calvarial abnormality. Sinuses/Orbits: Diffuse disease throughout the paranasal sinuses with extensive mucosal thickening. Near complete opacification of the maxillary, frontal and sphenoid sinuses. Mastoid air cells clear. Other: None IMPRESSION: No acute intracranial abnormality. Severe chronic sinusitis. Electronically Signed   By: Rolm Baptise M.D.   On: 05/30/2019 21:40   CT ANGIO NECK W OR WO CONTRAST  Result Date: 05/31/2019 CLINICAL DATA:  Left-sided weakness EXAM: CT ANGIOGRAPHY HEAD AND NECK TECHNIQUE: Multidetector CT imaging of the head and neck was performed using the standard protocol during bolus administration of intravenous contrast. Multiplanar CT image reconstructions and MIPs were obtained to  evaluate the vascular anatomy. Carotid stenosis measurements (when  applicable) are obtained utilizing NASCET criteria, using the distal internal carotid diameter as the denominator. CONTRAST:  118mL OMNIPAQUE IOHEXOL 350 MG/ML SOLN COMPARISON:  None. FINDINGS: CTA NECK FINDINGS Aortic arch: Great vessel origins are patent. Right carotid system: Common, internal, and external carotid arteries are patent. There is mild calcified plaque along the common carotid. Mild calcified plaque is present at the ICA origin, noting streak artifact through this region. There is no hemodynamically significant stenosis or evidence of dissection. Left carotid system: Common, internal, and external carotid arteries are patent. Mild calcified plaque is present along the common carotid. There is primarily calcified plaque at the bifurcation and ICA origin causing minimal stenosis, noting streak artifact through this region. There is no hemodynamically significant stenosis or evidence of dissection. Vertebral arteries: Dominant right vertebral artery is patent. Calcified plaque at its origin results in mild stenosis. Congenitally small caliber left vertebral artery is patent. Skeleton: Advanced degenerative changes of the cervical spine particularly from C4-C5 through C6-C7. Other neck: No neck mass or adenopathy. Diffuse paranasal sinus opacification with areas of hyperdensity likely reflecting inspissation with fungal colonization not excluded. Upper chest: No apical lung mass. Review of the MIP images confirms the above findings CTA HEAD FINDINGS Anterior circulation: Intracranial internal carotid arteries are patent with calcified plaque along cavernous and paraclinoid portions causing mild stenosis. Anterior and middle cerebral arteries are patent. Posterior circulation: Intracranial vertebral arteries are patent. The left vertebral artery terminates as a PICA. Basilar artery is patent. Posterior cerebral arteries are patent.  Posterior communicating arteries are present. Venous sinuses: Patent as permitted by contrast timing Anatomic variants: Fetal origin of the right PCA. Review of the MIP images confirms the above findings IMPRESSION: No proximal intracranial vessel occlusion. No occlusion or hemodynamically significant stenosis in the neck. Electronically Signed   By: Macy Mis M.D.   On: 05/31/2019 15:56   CARDIAC CATHETERIZATION  Result Date: 05/30/2019 Findings: RA = 8 RV = 58/12 PA = 67/13 (41) PCW = 24 Fick cardiac output/index = 6.4/4.0 PVR = 2.6 WU Ao sat = 99% PA sat = 67%, 68% High SVC sat = 69% Assessment: 1. Moderately elevated filling pressures with normal cardiac output Plan/Discussion: Continue IV diuresis. Given normal cardiac output would likely not benefit from inotropic support. Glori Bickers, MD 8:49 AM  US Abdomen Limited  Result Date: 06/05/2019 CLINICAL DATA:  Ascites EXAM: LIMITED ABDOMEN ULTRASOUND FOR ASCITES TECHNIQUE: Limited ultrasound survey for ascites was performed in all four abdominal quadrants. COMPARISON:  CT abdomen pelvis 06/01/2019 FINDINGS: Small volume ascites identified in the 4 quadrants. IMPRESSION: Scattered small volume ascites. Electronically Signed   By: Lavonia Dana M.D.   On: 06/05/2019 13:15   DG Chest Port 1 View  Result Date: 06/09/2019 CLINICAL DATA:  Pt unable to answer hx questions. Only moaned when Tech questioned her. Per nurse: Pt from [facility]. Reports difficulty breathing since 1100 this AM. Lethargic and SOB. 100% on RA, wheezing heard. 5 mg or albuterol given en route. EXAM: PORTABLE CHEST 1 VIEW COMPARISON:  Chest radiograph 06/05/2019 FINDINGS: Stable cardiomediastinal contours with enlarged heart size. Left ICD in place. Central pulmonary vascular congestion. Diffuse mild bilateral interstitial opacities likely reflecting trace edema. No new focal consolidation. No pneumothorax or large pleural effusion. No acute finding in the visualized  skeleton. IMPRESSION: Cardiomegaly with central pulmonary vascular congestion and mild bilateral interstitial opacities likely reflecting edema. Electronically Signed   By: Audie Pinto M.D.   On: 06/09/2019 15:59   DG  CHEST PORT 1 VIEW  Result Date: 06/05/2019 CLINICAL DATA:  Congestive heart failure. EXAM: PORTABLE CHEST 1 VIEW COMPARISON:  Single-view of the chest 05/31/2019. PA and lateral chest 05/26/2019. FINDINGS: There is cardiomegaly without edema. No consolidative process, pneumothorax or effusion. AICD is in place. IMPRESSION: Cardiomegaly without acute disease. Electronically Signed   By: Inge Rise M.D.   On: 06/05/2019 13:23   DG CHEST PORT 1 VIEW  Result Date: 05/31/2019 CLINICAL DATA:  Cough.  CHF. EXAM: PORTABLE CHEST 1 VIEW COMPARISON:  05/26/2019 FINDINGS: There is a left chest wall ICD with leads in the right atrial appendage and right ventricle. Cardiac enlargement. Aortic atherosclerosis. Pulmonary vascular congestion. No pleural effusion. No airspace opacities. IMPRESSION: 1. Similar appearance of cardiac enlargement and pulmonary vascular congestion. Electronically Signed   By: Kerby Moors M.D.   On: 05/31/2019 19:42   EEG adult  Result Date: 06/07/2019 Lora Havens, MD     06/07/2019 10:45 AM Patient Name: KINJAL NEITZKE MRN: [id number] Epilepsy Attending: Lora Havens Referring Physician/Provider: Dr Darliss Cheney Date: 06/07/2019 Duration: 22.47mins Patient history: 73yo M with sudden onset of slurred speech and left sided weakness. EEG to evaluate for seizure Level of alertness: awake AEDs during EEG study: None Technical aspects: This EEG study was done with scalp electrodes positioned according to the 10-20 International system of electrode placement. Electrical activity was acquired at a sampling rate of 500Hz  and reviewed with a high frequency filter of 70Hz  and a low frequency filter of 1Hz . EEG data were recorded continuously and digitally stored.  DESCRIPTION: No clear posterior dominant rhythm was seen. EEG showed continuous generalized polymorphic 3-6hz  theta-delta slowing. Sharp transient was seen in right frontotemporal region. Hyperventilation and photic stimulation were not performed.  ABNORMALITY - Continuous slow, generalized  IMPRESSION: This study is suggestive of moderate diffuse encephalopathy, non specific to etiology. No seizures or definite epileptiform discharges were seen throughout the recording. If suspicion for interictal activity remains high, consider long term eeg monitoring. Lora Havens    EEG adult  Result Date: 05/31/2019 Lora Havens, MD     05/31/2019  4:41 PM Patient Name: MABRY SANTARELLI MRN: [id number] Epilepsy Attending: Lora Havens Referring Physician/Provider: Dr Rosalin Hawking Date: 05/31/2019 Duration: 26.74mins Patient history: 73yo M with sudden onset of slurred speech and left sided weakness. EEG to evaluate for seizure Level of alertness: awake AEDs during EEG study: none Technical aspects: This EEG study was done with scalp electrodes positioned according to the 10-20 International system of electrode placement. Electrical activity was acquired at a sampling rate of 500Hz  and reviewed with a high frequency filter of 70Hz  and a low frequency filter of 1Hz . EEG data were recorded continuously and digitally stored. DESCRIPTION: No clear posterior dominant rhythm was seen. EEG showed continuous generalized polymorphic 3-6hz  theta-delta slowing. At times, generalized triphasic waves were also noted. Hyperventilation and photic stimulation were not performed. ABNORMALITY - Continuous slow, generalized - Triphasic waves, generalized IMPRESSION: This study is suggestive of moderate diffuse encephalopathy, non specific to etiology, could be secondary to toxic-metabolic causes. No seizures or definite epileptiform discharges were seen throughout the recording. Priyanka Barbra Sarks   CT HEAD CODE STROKE WO  CONTRAST`  Result Date: 05/31/2019 CLINICAL DATA:  Code stroke.  Left-sided weakness EXAM: CT HEAD WITHOUT CONTRAST TECHNIQUE: Contiguous axial images were obtained from the base of the skull through the vertex without intravenous contrast. COMPARISON:  CT head 04/15/2019 FINDINGS: Brain: Ventricle size normal. Small hypodensity  anterior limb internal capsule on the left unchanged from the prior study. Negative for acute infarct, hemorrhage, mass. No midline shift. Vascular: Negative for hyperdense vessel Skull: Negative Sinuses/Orbits: Extensive mucosal edema throughout the paranasal sinuses. Negative orbit. Other: None ASPECTS (Five Points Stroke Program Early CT Score) - Ganglionic level infarction (caudate, lentiform nuclei, internal capsule, insula, M1-M3 cortex): 7 - Supraganglionic infarction (M4-M6 cortex): 3 Total score (0-10 with 10 being normal): 10 IMPRESSION: 1. No acute abnormality and no change from the prior study 2. ASPECTS is 10 Electronically Signed   By: Franchot Gallo M.D.   On: 05/31/2019 15:03    Microbiology: Recent Results (from the past 240 hour(s))  SARS CORONAVIRUS 2 (TAT 6-24 HRS) Nasopharyngeal Nasopharyngeal Swab     Status: None   Collection Time: 06/09/19  4:53 PM   Specimen: Nasopharyngeal Swab  Result Value Ref Range Status   SARS Coronavirus 2 NEGATIVE NEGATIVE Final    Comment: (NOTE) SARS-CoV-2 target nucleic acids are NOT DETECTED. The SARS-CoV-2 RNA is generally detectable in upper and lower respiratory specimens during the acute phase of infection. Negative results do not preclude SARS-CoV-2 infection, do not rule out co-infections with other pathogens, and should not be used as the sole basis for treatment or other patient management decisions. Negative results must be combined with clinical observations, patient history, and epidemiological information. The expected result is Negative. Fact Sheet for  Patients: SugarRoll.be Fact Sheet for Healthcare Providers: https://www.woods-mathews.com/ This test is not yet approved or cleared by the [place] FDA and  has been authorized for detection and/or diagnosis of SARS-CoV-2 by FDA under an Emergency Use Authorization (EUA). This EUA will remain  in effect (meaning this test can be used) for the duration of the COVID-19 declaration under Section 56 4(b)(1) of the Act, 21 U.S.C. section 360bbb-3(b)(1), unless the authorization is terminated or revoked sooner. Performed at [facility], [place] [address] [place], [place] [address]   Culture, Urine     Status: Abnormal   Collection Time: 06/09/19  8:06 PM   Specimen: Urine, Clean Catch  Result Value Ref Range Status   Specimen Description URINE, CLEAN CATCH  Final   Special Requests NONE  Final   Culture (A)  Final    <10,000 COLONIES/mL INSIGNIFICANT GROWTH Performed at [facility], [place] [address] [place], [place] [address]    Report Status 06/11/2019 FINAL  Final     Labs: Basic Metabolic Panel: Recent Labs  Lab 06/13/19 0404 06/14/19 0420 06/15/19 0433 06/16/19 0401 06/16/19 1436 06/17/19 0607  NA 144 142 141 140  --  138  K 4.2 3.8 4.1 3.8  --  4.0  CL 106 105 105 103  --  102  CO2 29 27 29 27   --  27  GLUCOSE 121* 91 112* 102*  --  82  BUN 28* 26* 27* 28*  --  31*  CREATININE 1.95* 1.73* 1.90* 1.96*  --  1.87*  CALCIUM 8.1* 8.1* 8.3* 8.1*  --  8.1*  MG  --   --   --   --  2.0  --    Liver Function Tests: Recent Labs  Lab 06/16/19 0401  AST 63*  ALT 30  ALKPHOS 112  BILITOT 0.9  PROT 6.0*  ALBUMIN 1.6*   No results for input(s): LIPASE, AMYLASE in the last 168 hours. No results for input(s): AMMONIA in the last 168 hours. CBC: Recent Labs  Lab 06/12/19 0410 06/13/19 0404 06/14/19 0420 06/15/19  8325 06/16/19 0401  WBC 16.2* 14.9* 15.1* 15.6* 15.7*  NEUTROABS  --  11.1*  --   --  12.4*  HGB 7.9*  7.5* 7.7* 8.2* 7.7*  HCT 24.2* 23.5* 24.1* 25.1* 23.5*  MCV 78.3* 78.9* 78.5* 77.5* 76.3*  PLT 252 233 253 229 259   Cardiac Enzymes: No results for input(s): CKTOTAL, CKMB, CKMBINDEX, TROPONINI in the last 168 hours. BNP: BNP (last 3 results) Recent Labs    05/23/19 0057 05/26/19 1822 06/09/19 1541  BNP 1,286.3* 1,587.2* 2,039.7*    ProBNP (last 3 results) No results for input(s): PROBNP in the last 8760 hours.  CBG: Recent Labs  Lab 06/14/19 2308  GLUCAP 100*    Active Problems:   Hypertension   Nonischemic cardiomyopathy (HCC)   Acute on chronic combined systolic and diastolic CHF (congestive heart failure) (HCC)   PAF (paroxysmal atrial fibrillation) ([place])   ICD (implantable cardioverter-defibrillator), dual, [facility]   HLD (hyperlipidemia)   CKD (chronic kidney disease), stage III   Symptomatic anemia   Acute on chronic combined systolic and diastolic heart failure (HCC)   CHF exacerbation (HCC)   NSVT (nonsustained ventricular tachycardia) (HCC)   Hypoalbuminemia   Leukocytosis   Elevated troponin   DNR (do not resuscitate)   Adult failure to thrive   Time coordinating discharge: 38 minutes.  Signed:        Theresa Dohrman, DO [facility]  06/17/2019, 1:38 PM

## 2019-06-17 NOTE — Progress Notes (Signed)
Physical Therapy Treatment Patient Details Name: Stephanie Yates MRN: [id number] DOB: 1946-04-28 Today's Date: 06/17/2019    History of Present Illness 73yo female with recent hospitalization due to CHF exacerbation and fluid overload; during this stay had a neurological event (possible CVA vs seizures), and was ultimately discharged to SNF. SHe now returns from SNF due to increased lethargy and increased SOB. PMH chornic anemia, CHF, CKD stage IV, HLD, HTN, A-fib, mitral regurgitation, V-tach s/p AICD/PPM, ongoing GIB    PT Comments    Patient received in bed, pleasant and willing to participate in PT today, speech more garbled than last session and somewhat difficult to understand this afternoon but otherwise in good spirits. With extended time and cues for technique, able to perform bed mobility with min guard, functional transfers with MinAx2/RW, and pivot to chair with Min guardx2 for safety and RW. Very fatigued after only performing this transfer, declined trying gait this afternoon. VSS on room air. She was left up in the chair with all needs met, chair alarm active and nursing staff aware of patient status. Family and patient are currently adamantly refusing SNF, which was PT's initial recommendation; updated recommendations as below accordingly.  Patient suffers from generalized weakness, poor balance, and reduced functional activity tolerance which impairs their ability to perform daily activities like ambulate safely in the home.  A walker alone will not resolve the issues with performing activities of daily living. A wheelchair will allow patient to safely perform daily activities.  The patient can self propel in the home or has a caregiver who can provide assistance.        Follow Up Recommendations  Home health PT;Supervision/Assistance - 24 hour;Other (comment)(patient/family adamantly refusing SNF)     Equipment Recommendations  Rolling walker with 5" wheels;3in1  (PT);Wheelchair (measurements PT);Wheelchair cushion (measurements PT);Hospital bed    Recommendations for Other Services       Precautions / Restrictions Precautions Precautions: Fall Precaution Comments: grossly weak, fatigues quickly Restrictions Weight Bearing Restrictions: No    Mobility  Bed Mobility Overal bed mobility: Needs Assistance Bed Mobility: Supine to Sit     Supine to sit: Min guard;+2 for physical assistance     General bed mobility comments: able to perform bed mobility with min guard+2 for safety and extended time  Transfers Overall transfer level: Needs assistance Equipment used: Rolling walker (2 wheeled) Transfers: Sit to/from [facility] Sit to Stand: Min assist;+2 physical assistance Stand pivot transfers: Min guard;+2 physical assistance       General transfer comment: MinAx2 to boost to standing (could likey do so with heavier assist of +1), then min guardx2 for line management and safety to pivot to recliner, improved technique and endurance noted today but still very easily fatigued  Ambulation/Gait             General Gait Details: deferred- fatigue. Plan to attempt next session   Stairs             Wheelchair Mobility    Modified Rankin (Stroke Patients Only) Modified Rankin (Stroke Patients Only) Pre-Morbid Rankin Score: Slight disability Modified Rankin: Severe disability     Balance Overall balance assessment: Needs assistance Sitting-balance support: Bilateral upper extremity supported;Feet supported Sitting balance-Leahy Scale: Fair Sitting balance - Comments: min guard to maintin balance, much improved trunk control in dynamic situations   Standing balance support: Bilateral upper extremity supported;During functional activity Standing balance-Leahy Scale: Poor Standing balance comment: reliant on external support  Cognition Arousal/Alertness:  Awake/alert Behavior During Therapy: Flat affect Overall Cognitive Status: Impaired/Different from baseline Area of Impairment: Attention;Following commands;Safety/judgement;Awareness;Memory;Problem solving                 Orientation Level: Disoriented to;Situation;Time Current Attention Level: Sustained Memory: Decreased short-term memory Following Commands: Follows one step commands consistently;Follows one step commands with increased time Safety/Judgement: Decreased awareness of deficits;Decreased awareness of safety Awareness: Intellectual Problem Solving: Slow processing;Decreased initiation;Difficulty sequencing;Requires verbal cues;Requires tactile cues General Comments: continues to require increased time to respond to cues, speech a bit more garbled today so difficult to clearly assess cognition but did participate well      Exercises      General Comments General comments (skin integrity, edema, etc.): VSS on room air      Pertinent Vitals/Pain Pain Assessment: Faces Faces Pain Scale: Hurts little more Pain Location: stomach upset Pain Descriptors / Indicators: Discomfort Pain Intervention(s): Limited activity within patient's tolerance;Monitored during session    Home Living                      Prior Function            PT Goals (current goals can now be found in the care plan section) Acute Rehab PT Goals Patient Stated Goal: feel better, less stomach pain PT Goal Formulation: With patient Time For Goal Achievement: 06/24/19 Potential to Achieve Goals: Fair Progress towards PT goals: Progressing toward goals    Frequency    Min 3X/week      PT Plan Discharge plan needs to be updated;Frequency needs to be updated;Equipment recommendations need to be updated    Co-evaluation              AM-PAC PT "6 Clicks" Mobility   Outcome Measure  Help needed turning from your back to your side while in a flat bed without using  bedrails?: A Little Help needed moving from lying on your back to sitting on the side of a flat bed without using bedrails?: A Little Help needed moving to and from a bed to a chair (including a wheelchair)?: A Little Help needed standing up from a chair using your arms (e.g., wheelchair or bedside chair)?: A Lot Help needed to walk in hospital room?: A Lot Help needed climbing 3-5 steps with a railing? : Total 6 Click Score: 14    End of Session Equipment Utilized During Treatment: Gait belt Activity Tolerance: Patient tolerated treatment well;Patient limited by fatigue Patient left: in chair;with call bell/phone within reach;with chair alarm set Nurse Communication: Mobility status PT Visit Diagnosis: Muscle weakness (generalized) (M62.81);Difficulty in walking, not elsewhere classified (R26.2);Other symptoms and signs involving the nervous system (R29.898) Hemiplegia - Right/Left: Left Hemiplegia - dominant/non-dominant: Non-dominant Hemiplegia - caused by: Unspecified Pain - Right/Left: (abdomen) Pain - part of body: (abdomen)     Time: 1610-9604 PT Time Calculation (min) (ACUTE ONLY): 16 min  Charges:  $Therapeutic Activity: 8-22 mins                      Windell Norfolk, DPT, PN1   Supplemental Physical Therapist [place]    Pager [phone] Acute Rehab Office [phone]

## 2019-06-17 NOTE — Progress Notes (Signed)
Research scientist (life sciences) received Palliative referral from SW. Spoke with pt's daughter who confirms interest in our community palliative program. Referral submitted to our palliative team and they will follow up with Magda Paganini to determine a good date for the admission visit.   Please call with any questions and thank you for the referral.   Freddie Breech, RN [facility] Liaison [phone]

## 2019-06-17 NOTE — Clinical Social Work Note (Signed)
    Durable Medical Equipment  (From admission, onward)         Start     Ordered   06/17/19 0000  For home use only DME Hospital bed    Question Answer Comment  Length of Need Lifetime   The above medical condition requires: Patient requires the ability to reposition frequently   Head must be elevated greater than: 30 degrees   Bed type Semi-electric   Hoyer Lift Yes      06/17/19 1227   06/11/19 1045  For home use only DME 4 wheeled rolling walker with seat  Once    Question:  Patient needs a walker to treat with the following condition  Answer:  Heart failure ([place])   06/11/19 1044

## 2019-06-17 NOTE — Plan of Care (Signed)
  Problem: Clinical Measurements: Goal: Ability to maintain clinical measurements within normal limits will improve Outcome: Progressing   Problem: Coping: Goal: Level of anxiety will decrease Outcome: Completed/Met

## 2019-06-17 NOTE — Progress Notes (Signed)
Pt requires 2 people assist to pelvic from bed to chair, will require 2 people to move her from wheelchair into a car/ car into her home. Unsafe to send her home right now. Family to call back when ready to pick pt up with extra help.

## 2019-06-17 NOTE — Discharge Instructions (Signed)
Heart Failure, Diagnosis  Heart failure is a condition in which the heart has trouble pumping blood because it has become weak or stiff. This means that the heart does not pump blood well enough for the body to stay healthy. For some people with heart failure, fluid may back up into the lungs. There may also be swelling (edema) in the lower legs. Heart failure is usually a long-term (chronic) condition. It is important for you to take good care of yourself and follow the treatment plan from your health care provider. What are the causes? This condition may be caused by:  High blood pressure (hypertension). Hypertension causes the heart muscle to work harder than normal. This makes the heart stiff or weak.  Coronary artery disease, or CAD. CAD is the buildup of cholesterol and fat (plaque) in the arteries of the heart.  Heart attack, also called myocardial infarction. This injures the heart muscle, making it hard for the heart to pump blood.  Abnormal heart valves. The valves Kayode Petion not open and close properly, forcing the heart to pump harder to keep the blood flowing.  Heart muscle disease (cardiomyopathy or myocarditis). This is damage to the heart muscle. It can increase the risk of heart failure.  Lung disease. The heart works harder when the lungs are not healthy.  Abnormal heart rhythms. These can lead to heart failure. What increases the risk? The risk of heart failure increases as a person ages. This condition is also more likely to develop in people who:  Are overweight.  Are female.  Smoke or chew tobacco.  Abuse alcohol or illegal drugs.  Have taken medicines that can damage the heart, such as chemotherapy drugs.  Have diabetes.  Have abnormal heart rhythms.  Have thyroid problems.  Have low blood counts (anemia). What are the signs or symptoms? Symptoms of this condition include:  Shortness of breath with activity, such as when climbing stairs.  A cough that does not  go away.  Swelling of the feet, ankles, legs, or abdomen.  Losing weight for no reason.  Trouble breathing when lying flat (orthopnea).  Waking from sleep because of the need to sit up and get more air.  Rapid heartbeat.  Tiredness (fatigue) and loss of energy.  Feeling light-headed, dizzy, or close to fainting.  Loss of appetite.  Nausea.  Waking up more often during the night to urinate (nocturia).  Confusion. How is this diagnosed? This condition is diagnosed based on:  Your medical history, symptoms, and a physical exam.  Diagnostic tests, which may include: ? Echocardiogram. ? Electrocardiogram (ECG). ? Chest X-ray. ? Blood tests. ? Exercise stress test. ? Radionuclide scans. ? Cardiac catheterization and angiogram. How is this treated? Treatment for this condition is aimed at managing the symptoms of heart failure. Medicines Treatment may include medicines that:  Help lower blood pressure by relaxing (dilating) the blood vessels. These medicines are called ACE inhibitors (angiotensin-converting enzyme) and ARBs (angiotensin receptor blockers).  Cause the kidneys to remove salt and water from the blood through urination (diuretics).  Improve heart muscle strength and prevent the heart from beating too fast (beta blockers).  Increase the force of the heartbeat (digoxin). Healthy behavior changes     Treatment may also include making healthy lifestyle changes, such as:  Reaching and staying at a healthy weight.  Quitting smoking or chewing tobacco.  Eating heart-healthy foods.  Limiting or avoiding alcohol.  Stopping the use of illegal drugs.  Being physically active.  Other treatments   Other treatments may include:  Procedures to open blocked arteries or repair damaged valves.  Placing a pacemaker to improve heart function (cardiac resynchronization therapy).  Placing a device to treat serious abnormal heart rhythms (implantable cardioverter  defibrillator, or ICD).  Placing a device to improve the pumping ability of the heart (left ventricular assist device, or LVAD).  Receiving a healthy heart from a donor (heart transplant). This is done when other treatments have not helped. Follow these instructions at home:  Manage other health conditions as told by your health care provider. These may include hypertension, diabetes, thyroid disease, or abnormal heart rhythms.  Get ongoing education and support as needed. Learn as much as you can about heart failure.  Keep all follow-up visits as told by your health care provider. This is important. Summary  Heart failure is a condition in which the heart has trouble pumping blood because it has become weak or stiff.  This condition is caused by high blood pressure and other diseases of the heart and lungs.  Symptoms of this condition include shortness of breath, tiredness (fatigue), nausea, and swelling of the feet, ankles, legs, or abdomen.  Treatments for this condition may include medicines, lifestyle changes, and surgery.  Manage other health conditions as told by your health care provider. This information is not intended to replace advice given to you by your health care provider. Make sure you discuss any questions you have with your health care provider. Document Released: 06/13/2005 Document Revised: 08/31/2018 Document Reviewed: 08/31/2018 Elsevier Patient Education  2020 Elsevier Inc.  

## 2019-06-17 NOTE — TOC Transition Note (Signed)
Transition of Care [facility]) - CM/SW Discharge Note   Patient Details  Name: Stephanie Yates MRN: [id number] Date of Birth: Mar 28, 1946  Transition of Care [facility]) CM/SW Contact:  Eileen Stanford, LCSW Phone Number: 06/17/2019, 2:00 PM   Clinical Narrative:   Pt HH has been set up with [facility]. [employer] will deliver hospital bed and lift tomorrow per the request of pt's daughter. Pt's daughters states pt will stay with her tonight and will transition to her grandson's home tomorrow. Zack with [employer] is aware. Pt's daughter is aware pt will d/c today. Pt's daughter requesting a call when pt is ready to leave.    Final next level of care: Home w Home Health Services Barriers to Discharge: No Barriers Identified   Patient Goals and CMS Choice Patient states their goals for this hospitalization and ongoing recovery are:: "to go home"   Choice offered to / list presented to : Patient  Discharge Placement                Patient to be transferred to facility by: [place] Name of family member notified: Magda Paganini Patient and family notified of of transfer: 06/17/19  Discharge Plan and Services In-house Referral: Clinical Social Work   Post Acute Care Choice: Home Health          DME Arranged: Hospital bed, Walker rolling with seat([place] Lift) DME Agency: [employer] Date DME Agency Contacted: 06/17/19 Time DME Agency Contacted: [phone] Representative spoke with at DME Agency: [place] Arranged: PT, OT, RN [place] Agency: [facility] [place] Determinants of Health ([place]) Interventions     Readmission Risk Interventions Readmission Risk Prevention Plan 06/05/2019 05/27/2019 05/10/2019  Transportation Screening Complete Complete Complete  PCP or Specialist Appt within 3-5 Days - - -  HRI or [place] Work Consult for [place] - - -  Medication Review Press photographer) Complete Complete Complete   Med Review Comments - - -  PCP or Specialist appointment within 3-5 days of discharge Complete Complete Complete  HRI or Home Care Consult Complete Complete Complete  SW Recovery Care/Counseling Consult Complete Complete Complete  Palliative Care Screening Not Applicable Not Applicable Not Applicable  Skilled Nursing Facility Complete Not Applicable Not Applicable  Some recent data might be hidden

## 2019-06-18 ENCOUNTER — Emergency Department (HOSPITAL_COMMUNITY): Payer: Medicare Other

## 2019-06-18 ENCOUNTER — Other Ambulatory Visit: Payer: Self-pay

## 2019-06-18 ENCOUNTER — Inpatient Hospital Stay (HOSPITAL_COMMUNITY)
Admission: EM | Admit: 2019-06-18 | Discharge: 2019-06-27 | DRG: 640 | Disposition: A | Discharge status: Hospice | Payer: Medicare Other | Attending: Family Medicine | Admitting: Family Medicine

## 2019-06-18 ENCOUNTER — Encounter (HOSPITAL_COMMUNITY): Payer: Self-pay | Admitting: Emergency Medicine

## 2019-06-18 DIAGNOSIS — N184 Chronic kidney disease, stage 4 (severe): Secondary | ICD-10-CM | POA: Diagnosis present

## 2019-06-18 DIAGNOSIS — J9621 Acute and chronic respiratory failure with hypoxia: Secondary | ICD-10-CM

## 2019-06-18 DIAGNOSIS — K625 Hemorrhage of anus and rectum: Secondary | ICD-10-CM | POA: Diagnosis present

## 2019-06-18 DIAGNOSIS — K219 Gastro-esophageal reflux disease without esophagitis: Secondary | ICD-10-CM | POA: Diagnosis present

## 2019-06-18 DIAGNOSIS — Z789 Other specified health status: Secondary | ICD-10-CM

## 2019-06-18 DIAGNOSIS — R042 Hemoptysis: Secondary | ICD-10-CM

## 2019-06-18 DIAGNOSIS — I428 Other cardiomyopathies: Secondary | ICD-10-CM

## 2019-06-18 DIAGNOSIS — I472 Ventricular tachycardia: Secondary | ICD-10-CM | POA: Diagnosis not present

## 2019-06-18 DIAGNOSIS — E861 Hypovolemia: Principal | ICD-10-CM | POA: Diagnosis present

## 2019-06-18 DIAGNOSIS — R42 Dizziness and giddiness: Secondary | ICD-10-CM

## 2019-06-18 DIAGNOSIS — Z87891 Personal history of nicotine dependence: Secondary | ICD-10-CM

## 2019-06-18 DIAGNOSIS — I1310 Hypertensive heart and chronic kidney disease without heart failure, with stage 1 through stage 4 chronic kidney disease, or unspecified chronic kidney disease: Secondary | ICD-10-CM

## 2019-06-18 DIAGNOSIS — R0602 Shortness of breath: Secondary | ICD-10-CM

## 2019-06-18 DIAGNOSIS — D649 Anemia, unspecified: Secondary | ICD-10-CM | POA: Diagnosis present

## 2019-06-18 DIAGNOSIS — T502X5A Adverse effect of carbonic-anhydrase inhibitors, benzothiadiazides and other diuretics, initial encounter: Secondary | ICD-10-CM | POA: Diagnosis present

## 2019-06-18 DIAGNOSIS — I959 Hypotension, unspecified: Secondary | ICD-10-CM | POA: Diagnosis present

## 2019-06-18 DIAGNOSIS — D72829 Elevated white blood cell count, unspecified: Secondary | ICD-10-CM

## 2019-06-18 DIAGNOSIS — K7469 Other cirrhosis of liver: Secondary | ICD-10-CM | POA: Diagnosis present

## 2019-06-18 DIAGNOSIS — I480 Paroxysmal atrial fibrillation: Secondary | ICD-10-CM | POA: Diagnosis present

## 2019-06-18 DIAGNOSIS — Z7901 Long term (current) use of anticoagulants: Secondary | ICD-10-CM

## 2019-06-18 DIAGNOSIS — I5023 Acute on chronic systolic (congestive) heart failure: Secondary | ICD-10-CM

## 2019-06-18 DIAGNOSIS — R110 Nausea: Secondary | ICD-10-CM

## 2019-06-18 DIAGNOSIS — R140 Abdominal distension (gaseous): Secondary | ICD-10-CM

## 2019-06-18 DIAGNOSIS — J9601 Acute respiratory failure with hypoxia: Secondary | ICD-10-CM

## 2019-06-18 DIAGNOSIS — I5084 End stage heart failure: Secondary | ICD-10-CM | POA: Diagnosis present

## 2019-06-18 DIAGNOSIS — Z66 Do not resuscitate: Secondary | ICD-10-CM | POA: Diagnosis present

## 2019-06-18 DIAGNOSIS — D509 Iron deficiency anemia, unspecified: Secondary | ICD-10-CM | POA: Diagnosis present

## 2019-06-18 DIAGNOSIS — Z91018 Allergy to other foods: Secondary | ICD-10-CM

## 2019-06-18 DIAGNOSIS — Z7189 Other specified counseling: Secondary | ICD-10-CM

## 2019-06-18 DIAGNOSIS — K7460 Unspecified cirrhosis of liver: Secondary | ICD-10-CM

## 2019-06-18 DIAGNOSIS — N179 Acute kidney failure, unspecified: Secondary | ICD-10-CM

## 2019-06-18 DIAGNOSIS — I5043 Acute on chronic combined systolic (congestive) and diastolic (congestive) heart failure: Secondary | ICD-10-CM | POA: Diagnosis present

## 2019-06-18 DIAGNOSIS — Z6825 Body mass index (BMI) 25.0-25.9, adult: Secondary | ICD-10-CM

## 2019-06-18 DIAGNOSIS — E440 Moderate protein-calorie malnutrition: Secondary | ICD-10-CM | POA: Diagnosis present

## 2019-06-18 DIAGNOSIS — Z20828 Contact with and (suspected) exposure to other viral communicable diseases: Secondary | ICD-10-CM | POA: Diagnosis present

## 2019-06-18 DIAGNOSIS — I340 Nonrheumatic mitral (valve) insufficiency: Secondary | ICD-10-CM | POA: Diagnosis present

## 2019-06-18 DIAGNOSIS — R531 Weakness: Secondary | ICD-10-CM

## 2019-06-18 DIAGNOSIS — Z95810 Presence of automatic (implantable) cardiac defibrillator: Secondary | ICD-10-CM | POA: Diagnosis present

## 2019-06-18 DIAGNOSIS — Z515 Encounter for palliative care: Secondary | ICD-10-CM

## 2019-06-18 DIAGNOSIS — D696 Thrombocytopenia, unspecified: Secondary | ICD-10-CM

## 2019-06-18 DIAGNOSIS — I130 Hypertensive heart and chronic kidney disease with heart failure and stage 1 through stage 4 chronic kidney disease, or unspecified chronic kidney disease: Secondary | ICD-10-CM | POA: Diagnosis present

## 2019-06-18 DIAGNOSIS — R188 Other ascites: Secondary | ICD-10-CM | POA: Diagnosis not present

## 2019-06-18 DIAGNOSIS — I4729 Other ventricular tachycardia: Secondary | ICD-10-CM

## 2019-06-18 DIAGNOSIS — E785 Hyperlipidemia, unspecified: Secondary | ICD-10-CM | POA: Diagnosis present

## 2019-06-18 LAB — URINALYSIS, ROUTINE W REFLEX MICROSCOPIC
Bacteria, UA: NONE SEEN
Bilirubin Urine: NEGATIVE
Glucose, UA: NEGATIVE mg/dL
Ketones, ur: NEGATIVE mg/dL
Leukocytes,Ua: NEGATIVE
Nitrite: NEGATIVE
Protein, ur: NEGATIVE mg/dL
Specific Gravity, Urine: 1.009 (ref 1.005–1.030)
pH: 7 (ref 5.0–8.0)

## 2019-06-18 LAB — SARS CORONAVIRUS 2 (TAT 6-24 HRS): SARS Coronavirus 2: NEGATIVE

## 2019-06-18 LAB — COMPREHENSIVE METABOLIC PANEL
ALT: 29 U/L (ref 0–44)
AST: 69 U/L — ABNORMAL HIGH (ref 15–41)
Albumin: 1.9 g/dL — ABNORMAL LOW (ref 3.5–5.0)
Alkaline Phosphatase: 108 U/L (ref 38–126)
Anion gap: 10 (ref 5–15)
BUN: 37 mg/dL — ABNORMAL HIGH (ref 8–23)
CO2: 26 mmol/L (ref 22–32)
Calcium: 8.3 mg/dL — ABNORMAL LOW (ref 8.9–10.3)
Chloride: 99 mmol/L (ref 98–111)
Creatinine, Ser: 2.56 mg/dL — ABNORMAL HIGH (ref 0.44–1.00)
GFR calc Af Amer: 20 mL/min — ABNORMAL LOW (ref >60)
GFR calc non Af Amer: 18 mL/min — ABNORMAL LOW (ref >60)
Glucose, Bld: 112 mg/dL — ABNORMAL HIGH (ref 70–99)
Potassium: 4 mmol/L (ref 3.5–5.1)
Sodium: 135 mmol/L (ref 135–145)
Total Bilirubin: 1.6 mg/dL — ABNORMAL HIGH (ref 0.3–1.2)
Total Protein: 6.6 g/dL (ref 6.5–8.1)

## 2019-06-18 LAB — CBC WITH DIFFERENTIAL/PLATELET
Abs Immature Granulocytes: 0.08 10*3/uL — ABNORMAL HIGH (ref 0.00–0.07)
Basophils Absolute: 0.1 10*3/uL (ref 0.0–0.1)
Basophils Relative: 1 %
Eosinophils Absolute: 0.2 10*3/uL (ref 0.0–0.5)
Eosinophils Relative: 1 %
HCT: 25.9 % — ABNORMAL LOW (ref 36.0–46.0)
Hemoglobin: 8.5 g/dL — ABNORMAL LOW (ref 12.0–15.0)
Immature Granulocytes: 1 %
Lymphocytes Relative: 10 %
Lymphs Abs: 1.7 10*3/uL (ref 0.7–4.0)
MCH: 25.3 pg — ABNORMAL LOW (ref 26.0–34.0)
MCHC: 32.8 g/dL (ref 30.0–36.0)
MCV: 77.1 fL — ABNORMAL LOW (ref 80.0–100.0)
Monocytes Absolute: 1.5 10*3/uL — ABNORMAL HIGH (ref 0.1–1.0)
Monocytes Relative: 9 %
Neutro Abs: 12.8 10*3/uL — ABNORMAL HIGH (ref 1.7–7.7)
Neutrophils Relative %: 78 %
Platelets: 246 10*3/uL (ref 150–400)
RBC: 3.36 MIL/uL — ABNORMAL LOW (ref 3.87–5.11)
RDW: 17.6 % — ABNORMAL HIGH (ref 11.5–15.5)
WBC: 16.3 10*3/uL — ABNORMAL HIGH (ref 4.0–10.5)
nRBC: 0 % (ref 0.0–0.2)

## 2019-06-18 LAB — AMMONIA: Ammonia: 50 umol/L — ABNORMAL HIGH (ref 9–35)

## 2019-06-18 LAB — TROPONIN I (HIGH SENSITIVITY)
Troponin I (High Sensitivity): 36 ng/L — ABNORMAL HIGH (ref <18)
Troponin I (High Sensitivity): 59 ng/L — ABNORMAL HIGH (ref <18)

## 2019-06-18 LAB — CBG MONITORING, ED: Glucose-Capillary: 94 mg/dL (ref 70–99)

## 2019-06-18 MED ORDER — MECLIZINE HCL 25 MG PO TABS
25.0000 mg | ORAL_TABLET | Freq: Once | ORAL | Status: AC
  Administered 2019-06-18: 25 mg via ORAL
  Filled 2019-06-18: qty 1

## 2019-06-18 MED ORDER — ONDANSETRON HCL 4 MG/2ML IJ SOLN
4.0000 mg | Freq: Once | INTRAMUSCULAR | Status: AC
  Administered 2019-06-18: 4 mg via INTRAVENOUS
  Filled 2019-06-18: qty 2

## 2019-06-18 MED ORDER — SODIUM CHLORIDE 0.9 % IV BOLUS
500.0000 mL | Freq: Once | INTRAVENOUS | Status: AC
  Administered 2019-06-18: 500 mL via INTRAVENOUS

## 2019-06-18 NOTE — H&P (Signed)
History and Physical    Stephanie Yates GYF:[id number] DOB: Apr 25, 1946 DOA: 06/18/2019  PCP: Josetta Huddle, MD  Patient coming from: Home, lives with family  I have personally briefly reviewed patient's old medical records in [place]  Chief Complaint: dizziness  HPI: Stephanie Yates is a 73 y.o. female with medical history significant ofCHF/NICM (EF 10 to 15%), VT s/p AICD in 2014 and biventricular PPM, chronic anemia, CKD-4, HTN, HLD, moderate protein calorie malnutrition and GIB, prolonged QT/torsade who presents with complaint of dizziness following discharge yesterday.   She was admitted previously for acute on chronic combined systolic diastolic CHF exacerbation.  She was evaluated by heart failure team and conversation was started with palliative care.  Patient reportedly decided to make her DNR but was not yet ready for hospice.  Patient was discharged home with home health PT/OT/RN and a walker.  Since discharge patient reports that she has been very dizzy with laying down and standing.  She was advised come to ED by her physical therapist.  She endorsed being nauseous but no vomiting.  Has had decreased p.o. intake since discharge yesterday.  ED Course: She was afebrile and was hypotensive on admission down to 93/43 on room air.  Medical admission was requested given patient received 500 cc bolus but remained hypotensive and there was concern of fluid overload given her CKD and nonischemic cardiomyopathy with low EF.  CBC showed leukocytosis of 16.3 which appears to have been persistent since her last admission.  Hemoglobin is stable at 8.5. Sodium 135, potassium of 4.0, glucose of 112, creatinine of 2.56 which is elevated from 1.87 yesterday.  Total bilirubin elevated 1.6. Troponin elevated at 59.  Ammonia level 50. Chest x-ray negative Covid test negative. Negative urinalysis.  Review of Systems:  Constitutional: No Weight Change, No Fever ENT/Mouth:  No  Rhinorrhea Eyes:  No Vision Changes Cardiovascular: No Chest Pain, no SOB Respiratory:  no Dyspnea  Gastrointestinal: + Nausea, No Vomiting, No Diarrhea,  Genitourinary: no dysuria Musculoskeletal: No Arthralgias, No Myalgias Skin: No Skin Lesions, No Pruritus, Neuro: no Weakness, No Numbness,  No Loss of Consciousness, No Syncope Psych: + decrease appetite Heme/Lymph: No Bruising, No Bleeding  Past Medical History:  Diagnosis Date  . Chronic anemia   . Chronic systolic CHF (congestive heart failure) ([place])   . CKD (chronic kidney disease), stage IV ([place])   . Former tobacco use   . History of tobacco abuse   . Hyperlipidemia   . Hypertension   . Mitral regurgitation   . Nonischemic cardiomyopathy ([place])    a. EF 10-15% on 11/2012 cath with no significant CAD. b. EF 25% in 2019. b. EF 15% in 12/2018  . PAF (paroxysmal atrial fibrillation) ([place])   . Protein calorie malnutrition ([place])   . Quit consuming alcohol in remote past   . Sinus bradycardia 12/21/2012  . Ventricular tachycardia ([place]) 12/14/2012    Past Surgical History:  Procedure Laterality Date  . BIOPSY  01/22/2019   Procedure: BIOPSY;  Surgeon: Wilford Corner, MD;  Location: [place];  Service: Endoscopy;;  . COLONOSCOPY WITH PROPOFOL N/A 03/19/2019   Procedure: COLONOSCOPY WITH PROPOFOL;  Surgeon: Otis Brace, MD;  Location: [place];  Service: Gastroenterology;  Laterality: N/A;  . ESOPHAGOGASTRODUODENOSCOPY (EGD) WITH PROPOFOL N/A 06/18/2017   Procedure: ESOPHAGOGASTRODUODENOSCOPY (EGD) WITH PROPOFOL;  Surgeon: Ronnette Juniper, MD;  Location: [facility];  Service: Gastroenterology;  Laterality: N/A;  . ESOPHAGOGASTRODUODENOSCOPY (EGD) WITH PROPOFOL N/A 01/22/2019   Procedure: ESOPHAGOGASTRODUODENOSCOPY (EGD)  WITH PROPOFOL;  Surgeon: Wilford Corner, MD;  Location: [facility];  Service: Endoscopy;  Laterality: N/A;  . ESOPHAGOGASTRODUODENOSCOPY (EGD) WITH PROPOFOL N/A 03/19/2019   Procedure:  ESOPHAGOGASTRODUODENOSCOPY (EGD) WITH PROPOFOL;  Surgeon: Otis Brace, MD;  Location: [facility] ENDOSCOPY;  Service: Gastroenterology;  Laterality: N/A;  . IMPLANTABLE CARDIOVERTER DEFIBRILLATOR IMPLANT  12/19/2012   St. Jude dual-chamber ICD, serial number [id number]  . IMPLANTABLE CARDIOVERTER DEFIBRILLATOR IMPLANT N/A 12/19/2012   Procedure: IMPLANTABLE CARDIOVERTER DEFIBRILLATOR IMPLANT;  Surgeon: Deboraha Sprang, MD;  Location: [facility] CATH LAB;  Service: Cardiovascular;  Laterality: N/A;  . LEFT AND RIGHT HEART CATHETERIZATION WITH CORONARY ANGIOGRAM N/A 12/17/2012   Procedure: LEFT AND RIGHT HEART CATHETERIZATION WITH CORONARY ANGIOGRAM;  Surgeon: Sherren Mocha, MD;  Location: [facility] CATH LAB;  Service: Cardiovascular;  Laterality: N/A;  . POLYPECTOMY  03/19/2019   Procedure: POLYPECTOMY;  Surgeon: Otis Brace, MD;  Location: [facility] ENDOSCOPY;  Service: Gastroenterology;;  . RIGHT HEART CATH N/A 05/30/2019   Procedure: RIGHT HEART CATH;  Surgeon: Jolaine Artist, MD;  Location: [facility] CV LAB;  Service: Cardiovascular;  Laterality: N/A;  . SCLEROTHERAPY  01/22/2019   Procedure: SCLEROTHERAPY;  Surgeon: Wilford Corner, MD;  Location: [facility] ENDOSCOPY;  Service: Endoscopy;;     reports that she has quit smoking. She has never used smokeless tobacco. She reports that she does not drink alcohol or use drugs.  Allergies  Allergen Reactions  . Strawberry Extract Hives, Itching and Swelling         Family History  Problem Relation Age of Onset  . Cancer Mother      Prior to Admission medications   Medication Sig Start Date End Date Taking? Authorizing Provider  acetaminophen (TYLENOL) 500 MG tablet Take 1,000 mg by mouth every 6 (six) hours as needed (pain).    [provider]  albuterol (VENTOLIN HFA) 108 (90 Base) MCG/ACT inhaler Inhale 2 puffs into the lungs every 4 (four) hours as needed for wheezing or shortness of breath.    [provider]  amiodarone (PACERONE) 200  MG tablet Take 1 tablet (200 mg total) by mouth 2 (two) times daily. 05/14/19 06/13/19  Guilford Shi, MD  apixaban (ELIQUIS) 2.5 MG TABS tablet Take 1 tablet (2.5 mg total) by mouth 2 (two) times daily. 03/19/19   Debbe Odea, MD  atorvastatin (LIPITOR) 40 MG tablet Take 1 tablet (40 mg total) by mouth daily at 6 PM. 07/05/18   Deboraha Sprang, MD  carvedilol (COREG) 3.125 MG tablet Take 1 tablet (3.125 mg total) by mouth 2 (two) times daily with a meal. 09/12/18   Deboraha Sprang, MD  Ensure (ENSURE) Take 237 mLs by mouth every morning.     [provider]  ferrous gluconate (FERGON) 324 MG tablet Take 324 mg by mouth 2 (two) times daily.  07/05/18   [provider]  lactulose (CEPHULAC) 20 g packet Take 1 packet (20 g total) by mouth 2 (two) times daily. Hold for 3 or more than 3 bowel movements per day. 06/07/19 07/07/19  Darliss Cheney, MD  magnesium oxide (MAGNESIUM-OXIDE) 400 (241.3 Mg) MG tablet Take 1 tablet (400 mg total) by mouth daily. Patient taking differently: Take 1 tablet by mouth every morning.  05/14/19   Guilford Shi, MD  meclizine (ANTIVERT) 12.5 MG tablet Take 1 tablet (12.5 mg total) by mouth 2 (two) times daily as needed for dizziness. 06/17/19   Swayze, Ava, DO  mexiletine (MEXITIL) 200 MG capsule Take 1 capsule (200 mg total) by  mouth 2 (two) times daily. 01/24/19   [name], PA-C  ondansetron (ZOFRAN ODT) 4 MG disintegrating tablet Take 1 tablet (4 mg total) by mouth every 8 (eight) hours as needed for nausea or vomiting. 03/21/19   [place] Donnamarie Poag, DO  pantoprazole (PROTONIX) 40 MG tablet Take 1 tablet (40 mg total) by mouth daily. 04/05/19 07/04/19  [place] Donnamarie Poag, DO  polyethylene glycol (MIRALAX / GLYCOLAX) 17 g packet Take 17 g by mouth daily. Can take up to twice a day for constipation. Patient taking differently: Take 17 g by mouth every morning.  03/19/19   Debbe Odea, MD  torsemide (DEMADEX) 20 MG tablet Take 2 tablets (40 mg total) by  mouth daily. 06/18/19   Swayze, Ava, DO    Physical Exam: Vitals:   06/18/19 2030 06/18/19 2100 06/18/19 2130 06/18/19 2200  BP: (!) 108/44 (!) 101/40 (!) 104/41 111/88  Pulse: 60 (!) 59 60 65  Resp: 12 13 16 17   Temp:      TempSrc:      SpO2: 96% 95% 95% 93%  Weight:      Height:        Constitutional: NAD, calm, comfortable, nontoxic appearing thin female laying flat in bed with eyes closed Vitals:   06/18/19 2030 06/18/19 2100 06/18/19 2130 06/18/19 2200  BP: (!) 108/44 (!) 101/40 (!) 104/41 111/88  Pulse: 60 (!) 59 60 65  Resp: 12 13 16 17   Temp:      TempSrc:      SpO2: 96% 95% 95% 93%  Weight:      Height:       Eyes: PERRL, lids and conjunctivae normal ENMT: Mucous membranes are moist.  Neck: normal, supple Respiratory: clear to auscultation bilaterally, no wheezing, no crackles. Normal respiratory effort.  Cardiovascular: Regular rate and rhythm, no murmurs / rubs / gallops. No extremity edema.  Cool upper extremities with +2 radial pulse. Abdomen: no tenderness,Bowel sounds positive.  Musculoskeletal: no clubbing / cyanosis. No joint deformity upper and lower extremities. Good ROM, no contractures. Normal muscle tone.  Skin: no rashes, lesions, ulcers. No induration Neurologic: CN 2-12 grossly intact. Sensation intact. 4 out of 5 strength of the left upper and lower extremity.  5 out of 5 of all other extremities. Psychiatric: Normal judgment and insight. Alert and oriented x 3.  Drowsy.    Labs on Admission: I have personally reviewed following labs and imaging studies  CBC: Recent Labs  Lab 06/13/19 0404 06/14/19 0420 06/15/19 0433 06/16/19 0401 06/18/19 1300  WBC 14.9* 15.1* 15.6* 15.7* 16.3*  NEUTROABS 11.1*  --   --  12.4* 12.8*  HGB 7.5* 7.7* 8.2* 7.7* 8.5*  HCT 23.5* 24.1* 25.1* 23.5* 25.9*  MCV 78.9* 78.5* 77.5* 76.3* 77.1*  PLT 233 253 229 259 967   Basic Metabolic Panel: Recent Labs  Lab 06/14/19 0420 06/15/19 0433 06/16/19 0401  06/16/19 1436 06/17/19 0607 06/18/19 1300  NA 142 141 140  --  138 135  K 3.8 4.1 3.8  --  4.0 4.0  CL 105 105 103  --  102 99  CO2 27 29 27   --  27 26  GLUCOSE 91 112* 102*  --  82 112*  BUN 26* 27* 28*  --  31* 37*  CREATININE 1.73* 1.90* 1.96*  --  1.87* 2.56*  CALCIUM 8.1* 8.3* 8.1*  --  8.1* 8.3*  MG  --   --   --  2.0  --   --  GFR: Estimated Creatinine Clearance: 15 mL/min (A) (by C-G formula based on SCr of 2.56 mg/dL (H)). Liver Function Tests: Recent Labs  Lab 06/16/19 0401 06/18/19 1300  AST 63* 69*  ALT 30 29  ALKPHOS 112 108  BILITOT 0.9 1.6*  PROT 6.0* 6.6  ALBUMIN 1.6* 1.9*   No results for input(s): LIPASE, AMYLASE in the last 168 hours. Recent Labs  Lab 06/18/19 1302  AMMONIA 50*   Coagulation Profile: No results for input(s): INR, PROTIME in the last 168 hours. Cardiac Enzymes: No results for input(s): CKTOTAL, CKMB, CKMBINDEX, TROPONINI in the last 168 hours. BNP (last 3 results) No results for input(s): PROBNP in the last 8760 hours. HbA1C: No results for input(s): HGBA1C in the last 72 hours. CBG: Recent Labs  Lab 06/14/19 2308 06/18/19 1235  GLUCAP 100* 94   Lipid Profile: No results for input(s): CHOL, HDL, LDLCALC, TRIG, CHOLHDL, LDLDIRECT in the last 72 hours. Thyroid Function Tests: No results for input(s): TSH, T4TOTAL, FREET4, T3FREE, THYROIDAB in the last 72 hours. Anemia Panel: No results for input(s): VITAMINB12, FOLATE, FERRITIN, TIBC, IRON, RETICCTPCT in the last 72 hours. Urine analysis:    Component Value Date/Time   COLORURINE YELLOW 06/18/2019 1845   APPEARANCEUR CLEAR 06/18/2019 1845   LABSPEC 1.009 06/18/2019 1845   PHURINE 7.0 06/18/2019 1845   GLUCOSEU NEGATIVE 06/18/2019 1845   HGBUR MODERATE (A) 06/18/2019 1845   BILIRUBINUR NEGATIVE 06/18/2019 1845   KETONESUR NEGATIVE 06/18/2019 1845   PROTEINUR NEGATIVE 06/18/2019 1845   UROBILINOGEN 1.0 08/29/2014 2035   NITRITE NEGATIVE 06/18/2019 1845    LEUKOCYTESUR NEGATIVE 06/18/2019 1845    Radiological Exams on Admission: CT Head Wo Contrast  Result Date: 06/18/2019 CLINICAL DATA:  Lethargic and not eating EXAM: CT HEAD WITHOUT CONTRAST TECHNIQUE: Contiguous axial images were obtained from the base of the skull through the vertex without intravenous contrast. COMPARISON:  06/04/2019 FINDINGS: Brain: There is no acute intracranial hemorrhage, mass-effect, or edema. Gray-white differentiation is preserved. There is no extra-axial fluid collection. Ventricles and sulci are within normal limits in size and configuration. Patchy hypoattenuation in the supratentorial white matter is nonspecific but likely reflects stable chronic microvascular ischemic changes. Vascular: There is atherosclerotic calcification at the skull base. Skull: Calvarium is unremarkable. Sinuses/Orbits: Persistent paranasal sinus opacification with areas of high density likely reflecting inspissation. Orbits are unremarkable. Other: None. IMPRESSION: No acute intracranial abnormality. Stable chronic findings detailed above. Electronically Signed   By: Macy Mis M.D.   On: 06/18/2019 13:45   DG Chest Portable 1 View  Result Date: 06/18/2019 CLINICAL DATA:  Discharge from the hospital yesterday. Family reports patient is lethargic and not eating. Slow to respond. EXAM: PORTABLE CHEST 1 VIEW COMPARISON:  06/09/2019. FINDINGS: Enlarged cardiopericardial silhouette, stable from the prior study. Stable left anterior chest wall AICD. No mediastinal or hilar masses. Prominent bronchovascular markings most evident in the bases. No evidence of pneumonia or pulmonary edema. No pleural effusion or pneumothorax. Skeletal structures are grossly intact. IMPRESSION: No acute cardiopulmonary disease. Electronically Signed   By: Lajean Manes M.D.   On: 06/18/2019 14:14    EKG: Independently reviewed.   Assessment/Plan  Dizziness Likely hypovolemic from recent diuresis for acute on  chronic combined heart failure -Patient normotensive at the time of my evaluation after 500 cc bolus -Continue to monitor on telemetry -Orthostatic vital signs pending  Acute on chronic kidney disease stage IV -Worsening creatinine of 2.56 which is elevated from 1.87 yesterday. -Avoid nephrotoxic agent  Weakness Patient  noted to be more weak on left upper and lower extremity compared to the right.  CT head negative.  No other focal neurological signs suggest stroke.  This likely is chronic from deconditioning. PT eval  Combined chronic systolic and diastolic heart failure -Appears euvolemic on exam - hold toresmide due to AKI  Paroxysmal atrial fibrillation Continue Eliquis Coreg, amiodarone and mexitil   VT s/p ICD Continue magnesium  GERD Continue PPI  Hyperlipidemia continue statin  Leukocytosis Likely reactive.  Continue to monitor CBC  Elevated bilirubin Mildly elevated.  Possibly reactive.  Will monitor.     DVT prophylaxis: Eliquis Code Status: DNR Family Communication: Plan discussed with patient at bedside  disposition Plan: Home with observation Consults called:  Admission status: Observation    Kaylea Mounsey T Mallissa Lorenzen DO [facility]   If 7PM-7AM, please contact night-coverage [email or web address] Password [facility]  06/18/2019, 10:42 PM

## 2019-06-18 NOTE — ED Notes (Signed)
Pt said that her behind was becoming sore, pt repositioned to her right side.

## 2019-06-18 NOTE — ED Provider Notes (Signed)
3:40 PM Care assumed from Dr. Regenia Skeeter.  At time of transfer care, patient is awaiting reassessment of her 500 cc of fluid for AKI.  Patient has been having altered mental status with "lethargy" after discharge yesterday for CHF exacerbation.  Patient was found to be hypotensive, new AKI, and reportedly has an EF of around 10- 15% on last echo.  This raised concern for the safety of patient going home for outpatient rehydration and AKI treatment given the soft blood pressures and requirement of being gentle with fluid resuscitation with the heart failure.  Previous hospitalist requested 500 cc of fluid and reassessment before he would admit, anticipate reassessing patient after the fluid and then determining disposition.  After rehydration with the requested 500 cc of fluid, she is still feeling very fatigued, tired, and blood pressure still in the 67Y systolic.  Due to the AKI, soft pressures, and CHF, we feel she needs admission for gentle rehydration so as not to cause CHF exacerbation.  Troponin is rising.  We will call again for admission to hospital service.     Stephanie Yates, [name], MD 06/19/19 [phone]

## 2019-06-18 NOTE — ED Notes (Signed)
Patient is resting comfortably. 

## 2019-06-18 NOTE — ED Provider Notes (Signed)
Stephanie Yates Provider Note   CSN: [id number] Arrival date & time: 06/18/19  1220     History No chief complaint on file.   Stephanie Yates is a 73 y.o. female.  HPI  73 year old female presents with chief complaint of weakness and dizziness.  History is taken from the patient as well as the daughter over the phone.  Was discharged yesterday.  Patient complained of some dizziness this morning and last night.  She was given her morning meds this morning and about 30 minutes later seemed to be lethargic and not really responding.  Seem to be altered compared to baseline and so EMS was called.  Patient also did complain of some dizziness this morning and was given Antivert.  Patient tells me she has a headache as well.  Past Medical History:  Diagnosis Date  . Chronic anemia   . Chronic systolic CHF (congestive heart failure) ([place])   . CKD (chronic kidney disease), stage IV ([place])   . Former tobacco use   . History of tobacco abuse   . Hyperlipidemia   . Hypertension   . Mitral regurgitation   . Nonischemic cardiomyopathy ([place])    a. EF 10-15% on 11/2012 cath with no significant CAD. b. EF 25% in 2019. b. EF 15% in 12/2018  . PAF (paroxysmal atrial fibrillation) ([place])   . Protein calorie malnutrition ([place])   . Quit consuming alcohol in remote past   . Sinus bradycardia 12/21/2012  . Ventricular tachycardia ([place]) 12/14/2012    Patient Active Problem List   Diagnosis Date Noted  . DNR (do not resuscitate)   . Adult failure to thrive   . Hypoalbuminemia 06/09/2019  . Leukocytosis 06/09/2019  . Elevated troponin 06/09/2019  . Goals of care, counseling/discussion   . Pressure injury of skin 05/29/2019  . Acute on chronic systolic CHF (congestive heart failure) ([place]) 05/27/2019  . Torsades de pointes ([place]) 05/14/2019  . Weakness generalized   . Palliative care by specialist   . Gallbladder mass 05/06/2019  . Volume overload 05/06/2019  .  Vertigo 04/15/2019  . Abnormal liver function 04/15/2019  . Right adrenal mass ([place]) possible adenoma 03/18/2019  . Nephrolithiasis 03/18/2019  . Prolonged QT interval 03/17/2019  . Renal failure (ARF), acute on chronic ([place]) 03/17/2019  . Nausea and vomiting 03/17/2019  . Epigastric abdominal pain 01/29/2019  . Gastritis 01/28/2019  . CKD (chronic kidney disease) stage 4, GFR 15-29 ml/min (H[facility]) 01/28/2019  . NSVT (nonsustained ventricular tachycardia) ([place]) 01/17/2019  . AKI (acute kidney injury) ([place]) 01/12/2019  . DNR (do not resuscitate) discussion   . CHF exacerbation ([place]) 01/06/2019  . Influenza A 09/02/2018  . Lactic acidosis 09/02/2018  . Chronic combined systolic and diastolic congestive heart failure ([place]) 09/02/2018  . Rapid atrial fibrillation ([place]) 06/08/2018  . Acute on chronic combined systolic and diastolic heart failure ([place]) 06/07/2018  . Anemia 06/07/2018  . Acute GI bleeding 06/15/2017  . Chronic anticoagulation   . Acute on chronic congestive heart failure ([place])   . HLD (hyperlipidemia) 03/05/2017  . CKD (chronic kidney disease), stage III 03/05/2017  . Chest pain 03/05/2017  . Symptomatic anemia 03/05/2017  . PAF (paroxysmal atrial fibrillation) ([place]) 04/01/2013  . ICD (implantable cardioverter-defibrillator), dual, st Judes 04/01/2013  . PVC's (premature ventricular contractions) 04/01/2013  . Hypokalemia 12/21/2012  . History of tobacco abuse 12/21/2012  . Transaminitis 12/21/2012  . Sinus bradycardia 12/21/2012  . Hyperkalemia 12/20/2012  . Nonischemic cardiomyopathy ([place]) 12/20/2012  .  UTI (urinary tract infection) 12/20/2012  . Acute on chronic combined systolic and diastolic CHF (congestive heart failure) ([place]) 12/20/2012  . Ventricular tachycardia ([place]) 12/14/2012  . Hypertension 12/14/2012    Past Surgical History:  Procedure Laterality Date  . BIOPSY  01/22/2019   Procedure: BIOPSY;  Surgeon: Wilford Corner, MD;  Location: [place];   Service: Endoscopy;;  . COLONOSCOPY WITH PROPOFOL N/A 03/19/2019   Procedure: COLONOSCOPY WITH PROPOFOL;  Surgeon: Otis Brace, MD;  Location: [facility];  Service: Gastroenterology;  Laterality: N/A;  . ESOPHAGOGASTRODUODENOSCOPY (EGD) WITH PROPOFOL N/A 06/18/2017   Procedure: ESOPHAGOGASTRODUODENOSCOPY (EGD) WITH PROPOFOL;  Surgeon: Ronnette Juniper, MD;  Location: [facility];  Service: Gastroenterology;  Laterality: N/A;  . ESOPHAGOGASTRODUODENOSCOPY (EGD) WITH PROPOFOL N/A 01/22/2019   Procedure: ESOPHAGOGASTRODUODENOSCOPY (EGD) WITH PROPOFOL;  Surgeon: Wilford Corner, MD;  Location: [facility];  Service: Endoscopy;  Laterality: N/A;  . ESOPHAGOGASTRODUODENOSCOPY (EGD) WITH PROPOFOL N/A 03/19/2019   Procedure: ESOPHAGOGASTRODUODENOSCOPY (EGD) WITH PROPOFOL;  Surgeon: Otis Brace, MD;  Location: [facility] ENDOSCOPY;  Service: Gastroenterology;  Laterality: N/A;  . IMPLANTABLE CARDIOVERTER DEFIBRILLATOR IMPLANT  12/19/2012   St. Jude dual-chamber ICD, serial number [id number]  . IMPLANTABLE CARDIOVERTER DEFIBRILLATOR IMPLANT N/A 12/19/2012   Procedure: IMPLANTABLE CARDIOVERTER DEFIBRILLATOR IMPLANT;  Surgeon: Deboraha Sprang, MD;  Location: [facility] CATH LAB;  Service: Cardiovascular;  Laterality: N/A;  . LEFT AND RIGHT HEART CATHETERIZATION WITH CORONARY ANGIOGRAM N/A 12/17/2012   Procedure: LEFT AND RIGHT HEART CATHETERIZATION WITH CORONARY ANGIOGRAM;  Surgeon: Sherren Mocha, MD;  Location: [facility] CATH LAB;  Service: Cardiovascular;  Laterality: N/A;  . POLYPECTOMY  03/19/2019   Procedure: POLYPECTOMY;  Surgeon: Otis Brace, MD;  Location: [facility] ENDOSCOPY;  Service: Gastroenterology;;  . RIGHT HEART CATH N/A 05/30/2019   Procedure: RIGHT HEART CATH;  Surgeon: Jolaine Artist, MD;  Location: [facility] CV LAB;  Service: Cardiovascular;  Laterality: N/A;  . SCLEROTHERAPY  01/22/2019   Procedure: SCLEROTHERAPY;  Surgeon: Wilford Corner, MD;  Location: [facility] ENDOSCOPY;  Service: Endoscopy;;     OB  History   No obstetric history on file.     Family History  Problem Relation Age of Onset  . Cancer Mother     Social History   Tobacco Use  . Smoking status: Former Research scientist (life sciences)  . Smokeless tobacco: Never Used  Substance Use Topics  . Alcohol use: No  . Drug use: No    Home Medications Prior to Admission medications   Medication Sig Start Date End Date Taking? Authorizing Provider  acetaminophen (TYLENOL) 500 MG tablet Take 1,000 mg by mouth every 6 (six) hours as needed (pain).    [provider]  albuterol (VENTOLIN HFA) 108 (90 Base) MCG/ACT inhaler Inhale 2 puffs into the lungs every 4 (four) hours as needed for wheezing or shortness of breath.    [provider]  amiodarone (PACERONE) 200 MG tablet Take 1 tablet (200 mg total) by mouth 2 (two) times daily. 05/14/19 06/13/19  Guilford Shi, MD  apixaban (ELIQUIS) 2.5 MG TABS tablet Take 1 tablet (2.5 mg total) by mouth 2 (two) times daily. 03/19/19   Debbe Odea, MD  atorvastatin (LIPITOR) 40 MG tablet Take 1 tablet (40 mg total) by mouth daily at 6 PM. 07/05/18   Deboraha Sprang, MD  carvedilol (COREG) 3.125 MG tablet Take 1 tablet (3.125 mg total) by mouth 2 (two) times daily with a meal. 09/12/18   Deboraha Sprang, MD  Ensure (ENSURE) Take 237 mLs by mouth every morning.  [provider]  ferrous gluconate (FERGON) 324 MG tablet Take 324 mg by mouth 2 (two) times daily.  07/05/18   [provider]  lactulose (CEPHULAC) 20 g packet Take 1 packet (20 g total) by mouth 2 (two) times daily. Hold for 3 or more than 3 bowel movements per day. 06/07/19 07/07/19  Darliss Cheney, MD  magnesium oxide (MAGNESIUM-OXIDE) 400 (241.3 Mg) MG tablet Take 1 tablet (400 mg total) by mouth daily. Patient taking differently: Take 1 tablet by mouth every morning.  05/14/19   Guilford Shi, MD  meclizine (ANTIVERT) 12.5 MG tablet Take 1 tablet (12.5 mg total) by mouth 2 (two) times daily as needed for dizziness.  06/17/19   Swayze, Ava, DO  mexiletine (MEXITIL) 200 MG capsule Take 1 capsule (200 mg total) by mouth 2 (two) times daily. 01/24/19   [name], PA-C  ondansetron (ZOFRAN ODT) 4 MG disintegrating tablet Take 1 tablet (4 mg total) by mouth every 8 (eight) hours as needed for nausea or vomiting. 03/21/19   [place] Donnamarie Poag, DO  pantoprazole (PROTONIX) 40 MG tablet Take 1 tablet (40 mg total) by mouth daily. 04/05/19 07/04/19  [place] Donnamarie Poag, DO  polyethylene glycol (MIRALAX / GLYCOLAX) 17 g packet Take 17 g by mouth daily. Can take up to twice a day for constipation. Patient taking differently: Take 17 g by mouth every morning.  03/19/19   Debbe Odea, MD  torsemide (DEMADEX) 20 MG tablet Take 2 tablets (40 mg total) by mouth daily. 06/18/19   Swayze, Ava, DO    Allergies    Strawberry extract  Review of Systems   Review of Systems  Constitutional: Negative for fever.  Gastrointestinal: Negative for vomiting.  Neurological: Positive for dizziness and headaches.  All other systems reviewed and are negative.   Physical Exam Updated Vital Signs BP (!) 99/47   Pulse (!) 59   Temp 97.7 F (36.5 C) (Oral)   Resp 20   SpO2 98%   Physical Exam Vitals and nursing note reviewed.  Constitutional:      General: She is not in acute distress.    Appearance: She is well-developed.     Comments: Chronically ill appearing  HENT:     Head: Normocephalic and atraumatic.     Right Ear: External ear normal.     Left Ear: External ear normal.     Nose: Nose normal.  Eyes:     General:        Right eye: No discharge.        Left eye: No discharge.     Extraocular Movements:     Right eye: Nystagmus present.     Left eye: Nystagmus present.     Pupils: Pupils are equal, round, and reactive to light.     Comments: Vertical nystagmus bilaterally  Cardiovascular:     Rate and Rhythm: Normal rate and regular rhythm.     Heart sounds: Normal heart sounds.  Pulmonary:     Effort: Pulmonary  effort is normal.     Breath sounds: Normal breath sounds.  Abdominal:     Palpations: Abdomen is soft.     Tenderness: There is no abdominal tenderness.  Skin:    General: Skin is warm and dry.  Neurological:     Mental Status: She is alert.     Comments: Awake, alert, difficult to understand speech. CN 3-12 grossly intact. 5/5 strength in all 4 extremities.  Psychiatric:  Mood and Affect: Mood is not anxious.     ED Results / Procedures / Treatments   Labs (all labs ordered are listed, but only abnormal results are displayed) Labs Reviewed  COMPREHENSIVE METABOLIC PANEL - Abnormal; Notable for the following components:      Result Value   Glucose, Bld 112 (*)    BUN 37 (*)    Creatinine, Ser 2.56 (*)    Calcium 8.3 (*)    Albumin 1.9 (*)    AST 69 (*)    Total Bilirubin 1.6 (*)    GFR calc non Af Amer 18 (*)    GFR calc Af Amer 20 (*)    All other components within normal limits  CBC WITH DIFFERENTIAL/PLATELET - Abnormal; Notable for the following components:   WBC 16.3 (*)    RBC 3.36 (*)    Hemoglobin 8.5 (*)    HCT 25.9 (*)    MCV 77.1 (*)    MCH 25.3 (*)    RDW 17.6 (*)    Neutro Abs 12.8 (*)    Monocytes Absolute 1.5 (*)    Abs Immature Granulocytes 0.08 (*)    All other components within normal limits  AMMONIA - Abnormal; Notable for the following components:   Ammonia 50 (*)    All other components within normal limits  TROPONIN I (HIGH SENSITIVITY) - Abnormal; Notable for the following components:   Troponin I (High Sensitivity) 36 (*)    All other components within normal limits  SARS CORONAVIRUS 2 (TAT 6-24 HRS)  URINALYSIS, ROUTINE W REFLEX MICROSCOPIC  CBG MONITORING, ED  TROPONIN I (HIGH SENSITIVITY)    EKG EKG Interpretation  Date/Time:  Tuesday June 18 2019 12:30:21 EST Ventricular Rate:  60 PR Interval:    QRS Duration: 163 QT Interval:  547 QTC Calculation: 547 R Axis:   -8 Text Interpretation: Atrial-paced rhythm Left  bundle branch block similar to Jun 15 2019 Confirmed by Sherwood Gambler [phone]) on 06/18/2019 1:13:15 PM   Radiology CT Head Wo Contrast  Result Date: 06/18/2019 CLINICAL DATA:  Lethargic and not eating EXAM: CT HEAD WITHOUT CONTRAST TECHNIQUE: Contiguous axial images were obtained from the base of the skull through the vertex without intravenous contrast. COMPARISON:  06/04/2019 FINDINGS: Brain: There is no acute intracranial hemorrhage, mass-effect, or edema. Gray-white differentiation is preserved. There is no extra-axial fluid collection. Ventricles and sulci are within normal limits in size and configuration. Patchy hypoattenuation in the supratentorial white matter is nonspecific but likely reflects stable chronic microvascular ischemic changes. Vascular: There is atherosclerotic calcification at the skull base. Skull: Calvarium is unremarkable. Sinuses/Orbits: Persistent paranasal sinus opacification with areas of high density likely reflecting inspissation. Orbits are unremarkable. Other: None. IMPRESSION: No acute intracranial abnormality. Stable chronic findings detailed above. Electronically Signed   By: Macy Mis M.D.   On: 06/18/2019 13:45   DG Chest Portable 1 View  Result Date: 06/18/2019 CLINICAL DATA:  Discharge from the hospital yesterday. Family reports patient is lethargic and not eating. Slow to respond. EXAM: PORTABLE CHEST 1 VIEW COMPARISON:  06/09/2019. FINDINGS: Enlarged cardiopericardial silhouette, stable from the prior study. Stable left anterior chest wall AICD. No mediastinal or hilar masses. Prominent bronchovascular markings most evident in the bases. No evidence of pneumonia or pulmonary edema. No pleural effusion or pneumothorax. Skeletal structures are grossly intact. IMPRESSION: No acute cardiopulmonary disease. Electronically Signed   By: Lajean Manes M.D.   On: 06/18/2019 14:14    Procedures Procedures (including critical care  time)  Medications Ordered  in ED Medications  sodium chloride 0.9 % bolus 500 mL (has no administration in time range)  ondansetron (ZOFRAN) injection 4 mg (has no administration in time range)  meclizine (ANTIVERT) tablet 25 mg (25 mg Oral Given 06/18/19 1438)    ED Course  I have reviewed the triage vital signs and the nursing notes.  Pertinent labs & imaging results that were available during my care of the patient were reviewed by me and considered in my medical decision making (see chart for details).    MDM Rules/Calculators/A&P                      Patient's labs reveal an acute kidney injury.  She has soft blood pressures.  She will be given fluid bolus.  Given this I consulted the hospitalist as she was just discharged from their service.  I discussed with Dr. Starla Link, who feels that given palliative care was involved last admission and she probably needs to be set up for hospice, that there might not be much benefit to coming into the hospital.  He asked for her to get her fluids and then if still symptomatic to call the hospitalist back. Care transferred to Dr. Sherry Ruffing with fluids/re-evaluation pending. Final Clinical Impression(s) / ED Diagnoses Final diagnoses:  Acute kidney injury [facility])    Rx / DC Orders ED Discharge Orders    None       Sherwood Gambler, MD 06/18/19 1536

## 2019-06-18 NOTE — ED Notes (Signed)
Patient transported to CT 

## 2019-06-18 NOTE — Discharge Planning (Signed)
[facility] consulted regarding disposition plan.  EDCM reviewed chart to find pt dc'd to [facility] 12/11; returned to hospital 12/13 and dc's home with home health ([facility]) 12/21.  TOC team will discuss disposition with pt and family.

## 2019-06-18 NOTE — ED Triage Notes (Signed)
Per [facility] pt coming from home, was discharged from hospital yesterday. Family reports patient lethargic and not eating. Patient alert but slow to respond.

## 2019-06-18 NOTE — Discharge Planning (Signed)
[facility] met with pt at bedside regarding disposition plan.  Pt states her choice is to return home with home health.  Pt Decision relayed to EDP.

## 2019-06-18 NOTE — ED Notes (Signed)
Culture sent down with urine.  

## 2019-06-19 ENCOUNTER — Inpatient Hospital Stay (HOSPITAL_COMMUNITY): Payer: Medicare Other

## 2019-06-19 DIAGNOSIS — I428 Other cardiomyopathies: Secondary | ICD-10-CM | POA: Diagnosis present

## 2019-06-19 DIAGNOSIS — E861 Hypovolemia: Secondary | ICD-10-CM | POA: Diagnosis present

## 2019-06-19 DIAGNOSIS — E785 Hyperlipidemia, unspecified: Secondary | ICD-10-CM | POA: Diagnosis present

## 2019-06-19 DIAGNOSIS — R140 Abdominal distension (gaseous): Secondary | ICD-10-CM | POA: Diagnosis not present

## 2019-06-19 DIAGNOSIS — E440 Moderate protein-calorie malnutrition: Secondary | ICD-10-CM | POA: Diagnosis present

## 2019-06-19 DIAGNOSIS — I340 Nonrheumatic mitral (valve) insufficiency: Secondary | ICD-10-CM | POA: Diagnosis present

## 2019-06-19 DIAGNOSIS — Z66 Do not resuscitate: Secondary | ICD-10-CM | POA: Diagnosis present

## 2019-06-19 DIAGNOSIS — R042 Hemoptysis: Secondary | ICD-10-CM | POA: Diagnosis not present

## 2019-06-19 DIAGNOSIS — Z7901 Long term (current) use of anticoagulants: Secondary | ICD-10-CM

## 2019-06-19 DIAGNOSIS — J9601 Acute respiratory failure with hypoxia: Secondary | ICD-10-CM | POA: Diagnosis not present

## 2019-06-19 DIAGNOSIS — R0602 Shortness of breath: Secondary | ICD-10-CM | POA: Diagnosis not present

## 2019-06-19 DIAGNOSIS — K219 Gastro-esophageal reflux disease without esophagitis: Secondary | ICD-10-CM | POA: Diagnosis present

## 2019-06-19 DIAGNOSIS — Z95810 Presence of automatic (implantable) cardiac defibrillator: Secondary | ICD-10-CM | POA: Diagnosis not present

## 2019-06-19 DIAGNOSIS — I9589 Other hypotension: Secondary | ICD-10-CM

## 2019-06-19 DIAGNOSIS — K7469 Other cirrhosis of liver: Secondary | ICD-10-CM | POA: Diagnosis not present

## 2019-06-19 DIAGNOSIS — I959 Hypotension, unspecified: Secondary | ICD-10-CM | POA: Diagnosis present

## 2019-06-19 DIAGNOSIS — Z91018 Allergy to other foods: Secondary | ICD-10-CM | POA: Diagnosis not present

## 2019-06-19 DIAGNOSIS — N179 Acute kidney failure, unspecified: Secondary | ICD-10-CM | POA: Diagnosis present

## 2019-06-19 DIAGNOSIS — I130 Hypertensive heart and chronic kidney disease with heart failure and stage 1 through stage 4 chronic kidney disease, or unspecified chronic kidney disease: Secondary | ICD-10-CM | POA: Diagnosis present

## 2019-06-19 DIAGNOSIS — J9621 Acute and chronic respiratory failure with hypoxia: Secondary | ICD-10-CM | POA: Diagnosis not present

## 2019-06-19 DIAGNOSIS — Z20828 Contact with and (suspected) exposure to other viral communicable diseases: Secondary | ICD-10-CM | POA: Diagnosis present

## 2019-06-19 DIAGNOSIS — Z515 Encounter for palliative care: Secondary | ICD-10-CM | POA: Diagnosis not present

## 2019-06-19 DIAGNOSIS — I5043 Acute on chronic combined systolic (congestive) and diastolic (congestive) heart failure: Secondary | ICD-10-CM | POA: Diagnosis not present

## 2019-06-19 DIAGNOSIS — R188 Other ascites: Secondary | ICD-10-CM | POA: Diagnosis not present

## 2019-06-19 DIAGNOSIS — I5084 End stage heart failure: Secondary | ICD-10-CM | POA: Diagnosis present

## 2019-06-19 DIAGNOSIS — N184 Chronic kidney disease, stage 4 (severe): Secondary | ICD-10-CM | POA: Diagnosis present

## 2019-06-19 DIAGNOSIS — I480 Paroxysmal atrial fibrillation: Secondary | ICD-10-CM | POA: Diagnosis present

## 2019-06-19 DIAGNOSIS — I472 Ventricular tachycardia: Secondary | ICD-10-CM | POA: Diagnosis not present

## 2019-06-19 DIAGNOSIS — R531 Weakness: Secondary | ICD-10-CM | POA: Diagnosis not present

## 2019-06-19 DIAGNOSIS — K625 Hemorrhage of anus and rectum: Secondary | ICD-10-CM | POA: Diagnosis present

## 2019-06-19 DIAGNOSIS — R42 Dizziness and giddiness: Secondary | ICD-10-CM | POA: Diagnosis present

## 2019-06-19 DIAGNOSIS — K7460 Unspecified cirrhosis of liver: Secondary | ICD-10-CM | POA: Diagnosis not present

## 2019-06-19 DIAGNOSIS — Z87891 Personal history of nicotine dependence: Secondary | ICD-10-CM | POA: Diagnosis not present

## 2019-06-19 DIAGNOSIS — I5023 Acute on chronic systolic (congestive) heart failure: Secondary | ICD-10-CM | POA: Diagnosis not present

## 2019-06-19 LAB — CBC
HCT: 23.9 % — ABNORMAL LOW (ref 36.0–46.0)
Hemoglobin: 7.8 g/dL — ABNORMAL LOW (ref 12.0–15.0)
MCH: 25.1 pg — ABNORMAL LOW (ref 26.0–34.0)
MCHC: 32.6 g/dL (ref 30.0–36.0)
MCV: 76.8 fL — ABNORMAL LOW (ref 80.0–100.0)
Platelets: 248 10*3/uL (ref 150–400)
RBC: 3.11 MIL/uL — ABNORMAL LOW (ref 3.87–5.11)
RDW: 17.8 % — ABNORMAL HIGH (ref 11.5–15.5)
WBC: 16.8 10*3/uL — ABNORMAL HIGH (ref 4.0–10.5)
nRBC: 0 % (ref 0.0–0.2)

## 2019-06-19 LAB — COMPREHENSIVE METABOLIC PANEL
ALT: 28 U/L (ref 0–44)
AST: 60 U/L — ABNORMAL HIGH (ref 15–41)
Albumin: 1.8 g/dL — ABNORMAL LOW (ref 3.5–5.0)
Alkaline Phosphatase: 104 U/L (ref 38–126)
Anion gap: 11 (ref 5–15)
BUN: 37 mg/dL — ABNORMAL HIGH (ref 8–23)
CO2: 24 mmol/L (ref 22–32)
Calcium: 8.2 mg/dL — ABNORMAL LOW (ref 8.9–10.3)
Chloride: 103 mmol/L (ref 98–111)
Creatinine, Ser: 2.48 mg/dL — ABNORMAL HIGH (ref 0.44–1.00)
GFR calc Af Amer: 21 mL/min — ABNORMAL LOW (ref >60)
GFR calc non Af Amer: 18 mL/min — ABNORMAL LOW (ref >60)
Glucose, Bld: 78 mg/dL (ref 70–99)
Potassium: 3.9 mmol/L (ref 3.5–5.1)
Sodium: 138 mmol/L (ref 135–145)
Total Bilirubin: 1.6 mg/dL — ABNORMAL HIGH (ref 0.3–1.2)
Total Protein: 6.2 g/dL — ABNORMAL LOW (ref 6.5–8.1)

## 2019-06-19 LAB — BRAIN NATRIURETIC PEPTIDE: B Natriuretic Peptide: 1324 pg/mL — ABNORMAL HIGH (ref 0.0–100.0)

## 2019-06-19 LAB — TSH: TSH: 3.669 u[IU]/mL (ref 0.350–4.500)

## 2019-06-19 MED ORDER — ATORVASTATIN CALCIUM 40 MG PO TABS
40.0000 mg | ORAL_TABLET | Freq: Every day | ORAL | Status: DC
  Administered 2019-06-19 – 2019-06-21 (×3): 40 mg via ORAL
  Filled 2019-06-19 (×4): qty 1

## 2019-06-19 MED ORDER — ACETAMINOPHEN 500 MG PO TABS
1000.0000 mg | ORAL_TABLET | Freq: Four times a day (QID) | ORAL | Status: DC | PRN
  Administered 2019-06-20 – 2019-06-24 (×3): 1000 mg via ORAL
  Filled 2019-06-19 (×3): qty 2

## 2019-06-19 MED ORDER — FERROUS GLUCONATE 324 (38 FE) MG PO TABS
324.0000 mg | ORAL_TABLET | Freq: Two times a day (BID) | ORAL | Status: DC
  Administered 2019-06-19 – 2019-06-22 (×7): 324 mg via ORAL
  Filled 2019-06-19 (×8): qty 1

## 2019-06-19 MED ORDER — MAGNESIUM OXIDE 400 (241.3 MG) MG PO TABS
400.0000 mg | ORAL_TABLET | Freq: Every morning | ORAL | Status: DC
  Administered 2019-06-19 – 2019-06-22 (×4): 400 mg via ORAL
  Filled 2019-06-19 (×4): qty 1

## 2019-06-19 MED ORDER — LACTULOSE 10 GM/15ML PO SOLN
20.0000 g | Freq: Two times a day (BID) | ORAL | Status: DC
  Administered 2019-06-19 – 2019-06-20 (×2): 20 g via ORAL
  Filled 2019-06-19 (×4): qty 30

## 2019-06-19 MED ORDER — CARVEDILOL 3.125 MG PO TABS
3.1250 mg | ORAL_TABLET | Freq: Two times a day (BID) | ORAL | Status: DC
  Administered 2019-06-19 – 2019-06-22 (×7): 3.125 mg via ORAL
  Filled 2019-06-19 (×7): qty 1

## 2019-06-19 MED ORDER — AMIODARONE HCL 200 MG PO TABS
200.0000 mg | ORAL_TABLET | Freq: Two times a day (BID) | ORAL | Status: DC
  Administered 2019-06-19 – 2019-06-22 (×7): 200 mg via ORAL
  Filled 2019-06-19 (×8): qty 1

## 2019-06-19 MED ORDER — PANTOPRAZOLE SODIUM 40 MG PO TBEC
40.0000 mg | DELAYED_RELEASE_TABLET | Freq: Every day | ORAL | Status: DC
  Administered 2019-06-19 – 2019-06-22 (×4): 40 mg via ORAL
  Filled 2019-06-19 (×4): qty 1

## 2019-06-19 MED ORDER — SODIUM CHLORIDE 0.9 % IV SOLN: INTRAVENOUS | Status: AC

## 2019-06-19 MED ORDER — APIXABAN 2.5 MG PO TABS
2.5000 mg | ORAL_TABLET | Freq: Two times a day (BID) | ORAL | Status: DC
  Administered 2019-06-19 (×2): 2.5 mg via ORAL
  Filled 2019-06-19 (×4): qty 1

## 2019-06-19 MED ORDER — MEXILETINE HCL 200 MG PO CAPS
200.0000 mg | ORAL_CAPSULE | Freq: Two times a day (BID) | ORAL | Status: DC
  Administered 2019-06-19 – 2019-06-22 (×7): 200 mg via ORAL
  Filled 2019-06-19 (×8): qty 1

## 2019-06-19 NOTE — Progress Notes (Signed)
[facility]  PROGRESS NOTE  Stephanie Yates XLK:[id number] DOB: March 21, 1946 DOA: 06/18/2019 PCP: Josetta Huddle, MD Admit date - 06/18/2019   Admitting Physician Orene Desanctis, DO  Outpatient Primary MD for the patient is Josetta Huddle, MD  LOS - 0 Brief Narrative   Stephanie Yates is a 73 y.o. year old female with medical history significant for CHF/NICM (EF 10 to 15%), VT s/p [id number] and biventricular PPM, chronic anemia, CKD-4, HTN, HLD, moderate protein calorie malnutrition and GIB, prolonged QT/torsade with recent hospitalization from 12/13-12/21 who presented on 06/18/2019 with lethargy, decreased eating, decreased strength, and increased confusion and was found to have for CHF exacerbation and hepatic encephalopathy requiring lactulose.   Subjective  Stephanie Yates today feels tired. No chest pain or shortness of breath. No abdominal pain.  A & P    Lethargy, unclear etiology.  Ammonia not elevated. Has chronic comorbidities.  Likely related to advanced end-stage CHF as well as cirrhosis with possible ascites, CKD with AKI. -Check TSH -Address multiple comorbidities below -Reconsult palliative care to continue goals of care discussion given recurrent hospitalization  Hypotension, persistent.  Most recent SBP 101/48 despite holding home diuretics overnight.  Orthostatic vitals on admission were negative. Suspect related to decreased oral intake/appetite in setting of multiple comorbidities; also has cirrhosis with concern for ascites that could be contributing.   Could also be evidence of worsening heart disease leading to decreased output. No improvement with monitoring and patient continues to have minimal PO intake -Continue to hold home diuretics -gentle diuresis with IVF -Check BNP -Right upper quadrant ultrasound for evaluation of ascites -We change from observation status and admit as inpatient given above  AKI on CKD Stage 4, likely pre-renal from decreased PO intake  complicated by hypotension presumed related to diuretics. Doesn't look volume up.  Baseline creatinine 1.7-2, 2.56 on admission, currently 2.48. Did not improve while holding home diuretics overnight -Continue to hold home diuretics -Give gentle IVF given CHF for additional 24 hours and reassess BMP in a.m. -No ACE/ARB/ARN I CKD stage IV -Not a candidate for advanced therapies previous heart failure evaluation by cardiology  CHFrEF, NYAHA IV, euvolemic. Advanced end stage CHF, not a candidate for advanced CHF therapies on last cardiology evaluation while inpatient on 12/18.  Abdomen distended but no prominent JVD on exam, unclear hypotension related to hypovolemia or diminished output -Check BNP -Hold diuresis for now given hypotension -Daily weights, strict I's and O's -Continue Coreg 3.125 twice daily  Mild troponin elevation  Troponin of 36--59.  Nonischemic EKG  Cirrhosis with history of ascites Currently has abdominal distention.  Unclear if this related to potential CHF or baseline, patient reports this is worse than typical.  Ammonia slightly up at 44. -Continue lactulose -Obtain right upper quadrant ultrasound to assess for any recurrent ascites that may require therapeutic paracenteses  GERD, stable -continue Protonix  Paroxysmal atrial fibrillation, currently rate controlled History of ventricular tachycardia status post AICD in 2014 a biventricular pacemaker.  No arrhythmias on telemetry -Continue amiodarone, mexiletine and Eliquis -monitor on telemetry  Hyperlipidemia, stable -Continue atorvastatin  Chronic anemia  Moderate protein calorie malnutrition  DT/torsades  Multiple hospitalizations  Debility PT recommends SNF Stephanie is agreeable if patient becomes agreeable Patient reports bad experience with [facility] in the past    Family Communication  : Spoke with Stephanie Yates at [phone], discussed with mom for recommendation and plan  for today, she is amenable to skilled nursing facility if her mom  is agreeable, she understands her mom's heart is very weak and has a guarded prognosis given her hypotension and kidney dysfunction  Code Status : DNR  Disposition Plan  : Patient has AKI on CKD currently trying to determine if related to worsening heart function versus decreased oral intake related to lethargy, currently holding diuretics, will give additional small IV fluids given diminished oral intake, check BMP to ensure no concurrent CHF extubation  Consults  : None  Procedures  : None  DVT Prophylaxis  : Eliquis  Lab Results  Component Value Date   PLT 248 06/19/2019    Diet :  Diet Order            Diet Heart Room service appropriate? Yes; Fluid consistency: Thin  Diet effective now               Inpatient Medications Scheduled Meds: . amiodarone  200 mg Oral BID  . apixaban  2.5 mg Oral BID  . atorvastatin  40 mg Oral q1800  . carvedilol  3.125 mg Oral BID WC  . ferrous gluconate  324 mg Oral BID  . lactulose  20 g Oral BID  . magnesium oxide  400 mg Oral q morning - 10a  . mexiletine  200 mg Oral BID  . pantoprazole  40 mg Oral Daily   Continuous Infusions: PRN Meds:.acetaminophen  Antibiotics  :   Anti-infectives (From admission, onward)   None       Objective   Vitals:   06/19/19 0445 06/19/19 0447 06/19/19 0450 06/19/19 1159  BP: 105/64 (!) 114/51 (!) 125/58 (!) 115/54  Pulse: 65 65 66 70  Resp: 17 19 (!) 24 (!) 21  Temp:  98.1 F (36.7 C)  97.8 F (36.6 C)  TempSrc:  Oral  Oral  SpO2: 95% 95% 99% 98%  Weight:   56.5 kg   Height:        SpO2: 98 %  Wt Readings from Last 3 Encounters:  06/19/19 56.5 kg  06/17/19 57.3 kg  06/07/19 61.9 kg    No intake or output data in the 24 hours ending 06/19/19 1238  Physical Exam:  Awake Alert, oriented to self, place, context Slow speech but follows all commands with no appreciable deficits Day.AT, No appreciable  JVD Symmetrical Chest wall movement, Good air movement bilaterally on room air RRR,No Gallops,Rubs or new Murmurs,  +ve B.Sounds, Abd Soft and distended with  no tenderness, No rebound, guarding or rigidity. No edema   I have personally reviewed the following:   Data Reviewed:  CBC Recent Labs  Lab 06/13/19 0404 06/14/19 0420 06/15/19 0433 06/16/19 0401 06/18/19 1300 06/19/19 0541  WBC 14.9* 15.1* 15.6* 15.7* 16.3* 16.8*  HGB 7.5* 7.7* 8.2* 7.7* 8.5* 7.8*  HCT 23.5* 24.1* 25.1* 23.5* 25.9* 23.9*  PLT 233 253 229 259 246 248  MCV 78.9* 78.5* 77.5* 76.3* 77.1* 76.8*  MCH 25.2* 25.1* 25.3* 25.0* 25.3* 25.1*  MCHC 31.9 32.0 32.7 32.8 32.8 32.6  RDW 16.9* 17.0* 17.1* 17.5* 17.6* 17.8*  LYMPHSABS 2.0  --   --  1.8 1.7  --   MONOABS 1.4*  --   --  1.4* 1.5*  --   EOSABS 0.2  --   --  0.2 0.2  --   BASOSABS 0.1  --   --  0.1 0.1  --     Chemistries  Recent Labs  Lab 06/15/19 0433 06/16/19 0401 06/16/19 1436 06/17/19 0607 06/18/19 1300 06/19/19 0541  NA 141 140  --  138 135 138  K 4.1 3.8  --  4.0 4.0 3.9  CL 105 103  --  102 99 103  CO2 29 27  --  27 26 24   GLUCOSE 112* 102*  --  82 112* 78  BUN 27* 28*  --  31* 37* 37*  CREATININE 1.90* 1.96*  --  1.87* 2.56* 2.48*  CALCIUM 8.3* 8.1*  --  8.1* 8.3* 8.2*  MG  --   --  2.0  --   --   --   AST  --  63*  --   --  69* 60*  ALT  --  30  --   --  29 28  ALKPHOS  --  112  --   --  108 104  BILITOT  --  0.9  --   --  1.6* 1.6*   ------------------------------------------------------------------------------------------------------------------ No results for input(s): CHOL, HDL, LDLCALC, TRIG, CHOLHDL, LDLDIRECT in the last 72 hours.  Lab Results  Component Value Date   HGBA1C 5.6 05/31/2019   ------------------------------------------------------------------------------------------------------------------ No results for input(s): TSH, T4TOTAL, T3FREE, THYROIDAB in the last 72 hours.  Invalid input(s):  FREET3 ------------------------------------------------------------------------------------------------------------------ No results for input(s): VITAMINB12, FOLATE, FERRITIN, TIBC, IRON, RETICCTPCT in the last 72 hours.  Coagulation profile No results for input(s): INR, PROTIME in the last 168 hours.  No results for input(s): DDIMER in the last 72 hours.  Cardiac Enzymes No results for input(s): CKMB, TROPONINI, MYOGLOBIN in the last 168 hours.  Invalid input(s): CK ------------------------------------------------------------------------------------------------------------------    Component Value Date/Time   BNP 2,039.7 (H) 06/09/2019 1541   BNP 367.0 (H) 03/16/2016 1457    Micro Results Recent Results (from the past 240 hour(s))  SARS CORONAVIRUS 2 (TAT 6-24 HRS) Nasopharyngeal Nasopharyngeal Swab     Status: None   Collection Time: 06/09/19  4:53 PM   Specimen: Nasopharyngeal Swab  Result Value Ref Range Status   SARS Coronavirus 2 NEGATIVE NEGATIVE Final    Comment: (NOTE) SARS-CoV-2 target nucleic acids are NOT DETECTED. The SARS-CoV-2 RNA is generally detectable in upper and lower respiratory specimens during the acute phase of infection. Negative results do not preclude SARS-CoV-2 infection, do not rule out co-infections with other pathogens, and should not be used as the sole basis for treatment or other patient management decisions. Negative results must be combined with clinical observations, patient history, and epidemiological information. The expected result is Negative. Fact Sheet for Patients: SugarRoll.be Fact Sheet for Healthcare Providers: https://www.woods-mathews.com/ This test is not yet approved or cleared by the [place] FDA and  has been authorized for detection and/or diagnosis of SARS-CoV-2 by FDA under an Emergency Use Authorization (EUA). This EUA will remain  in effect (meaning this test can be  used) for the duration of the COVID-19 declaration under Section 56 4(b)(1) of the Act, 21 U.S.C. section 360bbb-3(b)(1), unless the authorization is terminated or revoked sooner. Performed at [facility], [place] [address] [place], [place] [address]   Culture, Urine     Status: Abnormal   Collection Time: 06/09/19  8:06 PM   Specimen: Urine, Clean Catch  Result Value Ref Range Status   Specimen Description URINE, CLEAN CATCH  Final   Special Requests NONE  Final   Culture (A)  Final    <10,000 COLONIES/mL INSIGNIFICANT GROWTH Performed at [facility], [place] [address], [place], [place] [address]    Report Status 06/11/2019 FINAL  Final  SARS CORONAVIRUS 2 (  TAT 6-24 HRS) Nasopharyngeal Nasopharyngeal Swab     Status: None   Collection Time: 06/18/19  5:58 PM   Specimen: Nasopharyngeal Swab  Result Value Ref Range Status   SARS Coronavirus 2 NEGATIVE NEGATIVE Final    Comment: (NOTE) SARS-CoV-2 target nucleic acids are NOT DETECTED. The SARS-CoV-2 RNA is generally detectable in upper and lower respiratory specimens during the acute phase of infection. Negative results do not preclude SARS-CoV-2 infection, do not rule out co-infections with other pathogens, and should not be used as the sole basis for treatment or other patient management decisions. Negative results must be combined with clinical observations, patient history, and epidemiological information. The expected result is Negative. Fact Sheet for Patients: SugarRoll.be Fact Sheet for Healthcare Providers: https://www.woods-mathews.com/ This test is not yet approved or cleared by the [place] FDA and  has been authorized for detection and/or diagnosis of SARS-CoV-2 by FDA under an Emergency Use Authorization (EUA). This EUA will remain  in effect (meaning this test can be used) for the duration of the COVID-19 declaration under Section 56 4(b)(1) of the Act, 21  U.S.C. section 360bbb-3(b)(1), unless the authorization is terminated or revoked sooner. Performed at [facility], [place] [address], [place], [place] [address]     Radiology Reports CT ABDOMEN PELVIS WO CONTRAST  Result Date: 06/14/2019 CLINICAL DATA:  Acute generalized abdominal pain. EXAM: CT ABDOMEN AND PELVIS WITHOUT CONTRAST TECHNIQUE: Multidetector CT imaging of the abdomen and pelvis was performed following the standard protocol without IV contrast. COMPARISON:  06/01/2019 FINDINGS: Lower chest: Mild bibasilar atelectasis. Hepatobiliary: Findings suspicious for hepatic cirrhosis are again demonstrated. No liver masses identified on this unenhanced exam. Gallbladder is unremarkable. No evidence of biliary ductal dilatation. Pancreas: No mass or inflammatory process visualized on this unenhanced exam. Spleen:  Within normal limits in size. Adrenals/Urinary tract: Stable 3.5 cm low-attenuation right adrenal mass, consistent with benign adenoma. A few tiny less than 5 mm renal calculi are again seen bilaterally, however there is no evidence of ureteral calculi or hydronephrosis. Unremarkable unopacified urinary bladder. Stomach/Bowel: No evidence of obstruction, inflammatory process, or abnormal fluid collections. Diverticulosis is seen mainly involving the descending and sigmoid colon, however there is no evidence of diverticulitis. Vascular/Lymphatic: No pathologically enlarged lymph nodes identified. No evidence of abdominal aortic aneurysm. Aortic atherosclerosis incidentally noted. Reproductive: Small uterine fibroids some of which are calcified are again noted. Adnexal regions are unremarkable. Other: Diffuse mesenteric and body wall edema, and mild ascites, have mildly increased since previous study. Musculoskeletal:  No suspicious bone lesions identified. IMPRESSION: Mild increase in mild ascites and diffuse mesenteric and body wall edema since prior study. No focal inflammatory process  or abscess identified. Probable hepatic cirrhosis. Colonic diverticulosis, without radiographic evidence of diverticulitis. Bilateral nephrolithiasis. No evidence of ureteral calculi or hydronephrosis. Stable small uterine fibroids. Stable benign right adrenal adenoma. Aortic Atherosclerosis (ICD10-I70.0). Electronically Signed   By: Marlaine Hind M.D.   On: 06/14/2019 18:21   CT ABDOMEN PELVIS WO CONTRAST  Result Date: 06/01/2019 CLINICAL DATA:  Abdominal pain.  Concern for diverticulitis. EXAM: CT ABDOMEN AND PELVIS WITHOUT CONTRAST TECHNIQUE: Multidetector CT imaging of the abdomen and pelvis was performed following the standard protocol without IV contrast. COMPARISON:  05/26/2019 FINDINGS: The lack of intravenous contrast limits the ability to evaluate solid abdominal organs. Lower chest: Limited visualization of lower thorax demonstrates minimal bibasilar heterogeneous opacities, left greater than right, improved compared to the 11/29 examination. No pleural effusion. Cardiomegaly. Pacer leads again terminate within  the right atrium and ventricle. No pericardial effusion. Hepatobiliary: Nodularity hepatic contour. There is diffuse decreased attempt increased attenuation of the hepatic parenchyma as could be seen in the setting of chronic amiodarone therapy. Trace amount of perihepatic ascites, unchanged to slightly progressed compared to the 05/26/2019 examination. Normal noncontrast appearance of the gallbladder. No radiopaque gallstones. Pancreas: Normal noncontrast appearance of the pancreas. Spleen: Normal noncontrast appearance of the spleen. Adrenals/Urinary Tract: The bilateral kidneys again appear atrophic. Punctate nonobstructing renal stones are again seen bilaterally with dominant nonobstructing stone within the inferior pole the right kidney measuring approximately 4 mm in diameter (image 38, series 3) and dominant nonobstructing stone within the inferior pole the left kidney measuring  approximately 2 mm (image 36, series 3). No renal stones are seen along the expected course of either ureter. Excreted contrast from previous contrast enhance head and neck CTA is seen within the urinary bladder. No urinary obstruction or perinephric stranding. Redemonstrated approximately 3.8 x 1.9 cm hypoattenuating right-sided adrenal adenoma. Normal noncontrast appearance of the left adrenal gland. Stomach/Bowel: Ingested enteric contrast extends to the level of the hepatic flexure of the colon. Redemonstrated small right-sided inguinal hernia which is noted to contain a short-segment of nondilated small bowel (representative coronal images 39 through 55, series 6), not resulting in enteric obstruction. Moderate colonic stool burden. Scattered colonic diverticulosis without evidence of superimposed acute diverticulitis. Normal appearance of the terminal ileum. The appendix is not visualized, however there is no definitive pericecal inflammatory change on this noncontrast examination. No pneumoperitoneum, pneumatosis or portal venous gas. Vascular/Lymphatic: Atherosclerotic plaque within normal caliber abdominal aorta. No bulky retroperitoneal, mesenteric, pelvic or inguinal lymphadenopathy. Reproductive: Dystrophic calcifications, presumed degenerating fibroids within and expectedly atrophic uterus. No discrete adnexal lesion. Trace amount of fluid in the pelvic cul-de-sac. Other: Moderate amount of diffuse body wall anasarca potentially secondary to third spacing. Musculoskeletal: No acute or aggressive osseous abnormalities. Grade 1 anterolisthesis of L4 upon L5 and L5 upon S1 without associated pars defects. A bone island is noted within the left acetabulum. IMPRESSION: 1. No explanation for patient's abdominal pain. Specifically, no evidence enteric obstruction or diverticulitis on this noncontrast examination. 2. Redemonstrated right inguinal hernia containing nondilated loops of small bowel and not  resulting in enteric obstruction. 3. Nonobstructive bilateral nephrolithiasis unchanged. 4. Cardiomegaly with diffuse body wall anasarca as could be seen in the setting early CHF. Clinical correlation is advised. 5. Suspected early cirrhotic change with minimal amount perihepatic ascites. 6.  Aortic Atherosclerosis (ICD10-I70.0). Electronically Signed   By: Sandi Mariscal M.D.   On: 06/01/2019 12:08   CT ABDOMEN PELVIS WO CONTRAST  Result Date: 05/26/2019 CLINICAL DATA:  73 year old female with history of abdominal distension. Diffuse abdominal pain, most severe on the right side. EXAM: CT ABDOMEN AND PELVIS WITHOUT CONTRAST TECHNIQUE: Multidetector CT imaging of the abdomen and pelvis was performed following the standard protocol without IV contrast. COMPARISON:  CT the abdomen and pelvis 03/17/2019. FINDINGS: Lower chest: Mild scarring in the visualized lung bases. Cardiomegaly. Atherosclerotic calcifications in the left anterior descending, left circumflex and right coronary arteries. Pacemaker leads terminating in the right atrium and right ventricular apex. Hepatobiliary: Liver has a slightly shrunken appearance and nodular contour, suggesting underlying cirrhosis. No definite suspicious cystic or solid hepatic lesions are confidently identified on today's noncontrast CT examination. Unenhanced appearance of the gallbladder is normal. Pancreas: No definite pancreatic mass or peripancreatic fluid collections or inflammatory changes are noted on today's noncontrast CT examination. Spleen: Unremarkable. Adrenals/Urinary Tract: Multiple nonobstructive  calculi are noted within the collecting systems of both kidneys, largest of which measures up to 4 mm in the lower pole collecting system of the right kidney. No additional calculi are confidently identified along the course of either ureter or within the lumen of the urinary bladder. No hydroureteronephrosis. Unenhanced appearance of the kidneys is otherwise  normal. Left adrenal gland is normal in appearance. 4.5 x 2.1 cm low-attenuation (-27 HU) right adrenal nodule, similar to prior studies, compatible with an adrenal myelolipoma. Unenhanced appearance of the urinary bladder is normal. Stomach/Bowel: Unenhanced appearance of the stomach is normal. No pathologic dilatation of small bowel or colon. Multiple loops of small bowel appear to extend into a large right inguinal hernia. The appendix is not confidently identified and may be surgically absent. Regardless, there are no inflammatory changes noted adjacent to the cecum to suggest the presence of an acute appendicitis at this time. Numerous colonic diverticulae are noted, without surrounding inflammatory changes to suggest an acute diverticulitis at this time. Vascular/Lymphatic: Aortic atherosclerosis. No lymphadenopathy noted in the abdomen or pelvis confidently identified on today's noncontrast CT examination. Reproductive: Uterus is markedly heterogeneous in appearance with multiple coarse calcifications, compatible with a fibroid uterus. Ovaries are unremarkable in appearance. Other: Trace volume of ascites.  No pneumoperitoneum. Musculoskeletal: There are no aggressive appearing lytic or blastic lesions noted in the visualized portions of the skeleton. IMPRESSION: 1. Large right inguinal hernia containing several loops of small bowel, without evidence of bowel incarceration or obstruction. 2. Numerous small nonobstructive calculi are noted in the collecting systems of both kidneys measuring up to 4 mm in the lower pole. No ureteral stones or findings of urinary tract obstruction are noted at this time. 3. Colonic diverticulosis without evidence of acute diverticulitis at this time. 4. Severe cardiomegaly. 5. Aortic atherosclerosis, in addition to least 3 vessel coronary artery disease. Assessment for potential risk factor modification, dietary therapy or pharmacologic therapy may be warranted, if clinically  indicated. 6. Morphologic changes in the liver suggestive of underlying cirrhosis. 7. Additional incidental findings, as above. Electronically Signed   By: Vinnie Langton M.D.   On: 05/26/2019 18:27   CT ANGIO HEAD W OR WO CONTRAST  Result Date: 05/31/2019 CLINICAL DATA:  Left-sided weakness EXAM: CT ANGIOGRAPHY HEAD AND NECK TECHNIQUE: Multidetector CT imaging of the head and neck was performed using the standard protocol during bolus administration of intravenous contrast. Multiplanar CT image reconstructions and MIPs were obtained to evaluate the vascular anatomy. Carotid stenosis measurements (when applicable) are obtained utilizing NASCET criteria, using the distal internal carotid diameter as the denominator. CONTRAST:  119mL OMNIPAQUE IOHEXOL 350 MG/ML SOLN COMPARISON:  None. FINDINGS: CTA NECK FINDINGS Aortic arch: Great vessel origins are patent. Right carotid system: Common, internal, and external carotid arteries are patent. There is mild calcified plaque along the common carotid. Mild calcified plaque is present at the ICA origin, noting streak artifact through this region. There is no hemodynamically significant stenosis or evidence of dissection. Left carotid system: Common, internal, and external carotid arteries are patent. Mild calcified plaque is present along the common carotid. There is primarily calcified plaque at the bifurcation and ICA origin causing minimal stenosis, noting streak artifact through this region. There is no hemodynamically significant stenosis or evidence of dissection. Vertebral arteries: Dominant right vertebral artery is patent. Calcified plaque at its origin results in mild stenosis. Congenitally small caliber left vertebral artery is patent. Skeleton: Advanced degenerative changes of the cervical spine particularly from C4-C5 through C6-C7.  Other neck: No neck mass or adenopathy. Diffuse paranasal sinus opacification with areas of hyperdensity likely reflecting  inspissation with fungal colonization not excluded. Upper chest: No apical lung mass. Review of the MIP images confirms the above findings CTA HEAD FINDINGS Anterior circulation: Intracranial internal carotid arteries are patent with calcified plaque along cavernous and paraclinoid portions causing mild stenosis. Anterior and middle cerebral arteries are patent. Posterior circulation: Intracranial vertebral arteries are patent. The left vertebral artery terminates as a PICA. Basilar artery is patent. Posterior cerebral arteries are patent. Posterior communicating arteries are present. Venous sinuses: Patent as permitted by contrast timing Anatomic variants: Fetal origin of the right PCA. Review of the MIP images confirms the above findings IMPRESSION: No proximal intracranial vessel occlusion. No occlusion or hemodynamically significant stenosis in the neck. Electronically Signed   By: Macy Mis M.D.   On: 05/31/2019 15:56   DG Chest 2 View  Result Date: 05/26/2019 CLINICAL DATA:  Patient with right-sided abdominal pain. History of CHF. EXAM: CHEST - 2 VIEW COMPARISON:  Chest radiograph 02/20/2019 FINDINGS: Multi lead AICD device overlies the left hemithorax. Leads are stable in position. Stable cardiomegaly. Tortuosity of the thoracic aorta. Heterogeneous opacities left lung base. Pulmonary vascular redistribution. Trace bilateral pleural effusions. Thoracic spine degenerative changes. IMPRESSION: Cardiomegaly with pulmonary vascular redistribution and mild interstitial edema. Heterogeneous opacities left lung base may represent atelectasis. Probable trace bilateral pleural effusions. Electronically Signed   By: Lovey Newcomer M.D.   On: 05/26/2019 20:01   DG Chest 2 View  Result Date: 05/23/2019 CLINICAL DATA:  Shortness of breath EXAM: CHEST - 2 VIEW COMPARISON:  Radiograph 05/05/2019 FINDINGS: Cardiomegaly is similar to slightly increased from comparison AP radiography. Pacer/defibrillator pack  overlies the left chest wall leads in the cardiac apex and right atrium. There is a mildly increased interstitial opacity with fissural and septal thickening and indistinct pulmonary vascularity. No consolidative opacity. Atelectatic changes in the lung bases. No visible pneumothorax or effusion. The osseous structures appear diffusely demineralized which may limit detection of small or nondisplaced fractures. No acute osseous or soft tissue abnormality. Degenerative changes are present in the imaged spine and shoulders. IMPRESSION: 1. Cardiomegaly with mild interstitial pulmonary edema, similar to slightly increased from prior study. 2. Bibasilar atelectasis. No consolidation. Electronically Signed   By: Lovena Le M.D.   On: 05/23/2019 01:17   CT Head Wo Contrast  Result Date: 06/18/2019 CLINICAL DATA:  Lethargic and not eating EXAM: CT HEAD WITHOUT CONTRAST TECHNIQUE: Contiguous axial images were obtained from the base of the skull through the vertex without intravenous contrast. COMPARISON:  06/04/2019 FINDINGS: Brain: There is no acute intracranial hemorrhage, mass-effect, or edema. Gray-white differentiation is preserved. There is no extra-axial fluid collection. Ventricles and sulci are within normal limits in size and configuration. Patchy hypoattenuation in the supratentorial white matter is nonspecific but likely reflects stable chronic microvascular ischemic changes. Vascular: There is atherosclerotic calcification at the skull base. Skull: Calvarium is unremarkable. Sinuses/Orbits: Persistent paranasal sinus opacification with areas of high density likely reflecting inspissation. Orbits are unremarkable. Other: None. IMPRESSION: No acute intracranial abnormality. Stable chronic findings detailed above. Electronically Signed   By: Macy Mis M.D.   On: 06/18/2019 13:45   CT HEAD WO CONTRAST  Result Date: 06/04/2019 CLINICAL DATA:  Left-sided weakness EXAM: CT HEAD WITHOUT CONTRAST  TECHNIQUE: Contiguous axial images were obtained from the base of the skull through the vertex without intravenous contrast. COMPARISON:  06/01/2019 FINDINGS: Brain: There is no acute intracranial  hemorrhage, mass-effect, or edema. There is no new loss of gray-white differentiation. Patchy hypoattenuation in the supratentorial white matter is nonspecific but likely reflects stable chronic microvascular ischemic changes. There is no extra-axial fluid collection. Ventricles and sulci are stable in size and configuration. Vascular: There is atherosclerotic calcification at the skull base. Skull: Calvarium is unremarkable. Sinuses/Orbits: Persistent diffuse polypoid mucosal thickening with areas of increased density likely reflecting inspissation. Other: None. IMPRESSION: No significant change since 06/01/2019. Electronically Signed   By: Macy Mis M.D.   On: 06/04/2019 14:21   CT HEAD WO CONTRAST  Result Date: 06/01/2019 CLINICAL DATA:  Encephalopathy. Follow-up for question of left basal ganglia region stroke. EXAM: CT HEAD WITHOUT CONTRAST TECHNIQUE: Contiguous axial images were obtained from the base of the skull through the vertex without intravenous contrast. COMPARISON:  CT 05/31/2019, 05/30/2019 and 04/15/2019. FINDINGS: Brain: No acute finding. The brainstem and cerebellum appear normal. Cerebral hemispheres show mild chronic small-vessel ischemic change of the white matter. This includes an area of low-density in the anterior limb internal capsule on the left which was present in October in therefore not acute. No sign of acute infarction, mass lesion, hemorrhage, hydrocephalus or extra-axial collection. Vascular: There is atherosclerotic calcification of the major vessels at the base of the brain. Skull: Negative Sinuses/Orbits: Widespread sinus opacification consistent with rhinosinusitis. Orbits negative. Other: None IMPRESSION: No acute intracranial finding. Chronic small-vessel ischemic changes  of the white matter. Low-density in the anterior limb internal capsule on the left question as acute on the recent study was, I think, present on 04/15/2019 and therefore not acute. Widespread rhinosinusitis. Electronically Signed   By: Nelson Chimes M.D.   On: 06/01/2019 11:12   CT HEAD WO CONTRAST  Result Date: 05/30/2019 CLINICAL DATA:  Vertigo EXAM: CT HEAD WITHOUT CONTRAST TECHNIQUE: Contiguous axial images were obtained from the base of the skull through the vertex without intravenous contrast. COMPARISON:  04/15/2019 FINDINGS: Brain: No acute intracranial abnormality. Specifically, no hemorrhage, hydrocephalus, mass lesion, acute infarction, or significant intracranial injury. Vascular: No hyperdense vessel or unexpected calcification. Skull: No acute calvarial abnormality. Sinuses/Orbits: Diffuse disease throughout the paranasal sinuses with extensive mucosal thickening. Near complete opacification of the maxillary, frontal and sphenoid sinuses. Mastoid air cells clear. Other: None IMPRESSION: No acute intracranial abnormality. Severe chronic sinusitis. Electronically Signed   By: Rolm Baptise M.D.   On: 05/30/2019 21:40   CT ANGIO NECK W OR WO CONTRAST  Result Date: 05/31/2019 CLINICAL DATA:  Left-sided weakness EXAM: CT ANGIOGRAPHY HEAD AND NECK TECHNIQUE: Multidetector CT imaging of the head and neck was performed using the standard protocol during bolus administration of intravenous contrast. Multiplanar CT image reconstructions and MIPs were obtained to evaluate the vascular anatomy. Carotid stenosis measurements (when applicable) are obtained utilizing NASCET criteria, using the distal internal carotid diameter as the denominator. CONTRAST:  178mL OMNIPAQUE IOHEXOL 350 MG/ML SOLN COMPARISON:  None. FINDINGS: CTA NECK FINDINGS Aortic arch: Great vessel origins are patent. Right carotid system: Common, internal, and external carotid arteries are patent. There is mild calcified plaque along the  common carotid. Mild calcified plaque is present at the ICA origin, noting streak artifact through this region. There is no hemodynamically significant stenosis or evidence of dissection. Left carotid system: Common, internal, and external carotid arteries are patent. Mild calcified plaque is present along the common carotid. There is primarily calcified plaque at the bifurcation and ICA origin causing minimal stenosis, noting streak artifact through this region. There is no hemodynamically significant stenosis  or evidence of dissection. Vertebral arteries: Dominant right vertebral artery is patent. Calcified plaque at its origin results in mild stenosis. Congenitally small caliber left vertebral artery is patent. Skeleton: Advanced degenerative changes of the cervical spine particularly from C4-C5 through C6-C7. Other neck: No neck mass or adenopathy. Diffuse paranasal sinus opacification with areas of hyperdensity likely reflecting inspissation with fungal colonization not excluded. Upper chest: No apical lung mass. Review of the MIP images confirms the above findings CTA HEAD FINDINGS Anterior circulation: Intracranial internal carotid arteries are patent with calcified plaque along cavernous and paraclinoid portions causing mild stenosis. Anterior and middle cerebral arteries are patent. Posterior circulation: Intracranial vertebral arteries are patent. The left vertebral artery terminates as a PICA. Basilar artery is patent. Posterior cerebral arteries are patent. Posterior communicating arteries are present. Venous sinuses: Patent as permitted by contrast timing Anatomic variants: Fetal origin of the right PCA. Review of the MIP images confirms the above findings IMPRESSION: No proximal intracranial vessel occlusion. No occlusion or hemodynamically significant stenosis in the neck. Electronically Signed   By: Macy Mis M.D.   On: 05/31/2019 15:56   CARDIAC CATHETERIZATION  Result Date:  05/30/2019 Findings: RA = 8 RV = 58/12 PA = 67/13 (41) PCW = 24 Fick cardiac output/index = 6.4/4.0 PVR = 2.6 WU Ao sat = 99% PA sat = 67%, 68% High SVC sat = 69% Assessment: 1. Moderately elevated filling pressures with normal cardiac output Plan/Discussion: Continue IV diuresis. Given normal cardiac output would likely not benefit from inotropic support. Glori Bickers, MD 8:49 AM  US Abdomen Limited  Result Date: 06/05/2019 CLINICAL DATA:  Ascites EXAM: LIMITED ABDOMEN ULTRASOUND FOR ASCITES TECHNIQUE: Limited ultrasound survey for ascites was performed in all four abdominal quadrants. COMPARISON:  CT abdomen pelvis 06/01/2019 FINDINGS: Small volume ascites identified in the 4 quadrants. IMPRESSION: Scattered small volume ascites. Electronically Signed   By: Lavonia Dana M.D.   On: 06/05/2019 13:15   DG Chest Portable 1 View  Result Date: 06/18/2019 CLINICAL DATA:  Discharge from the hospital yesterday. Family reports patient is lethargic and not eating. Slow to respond. EXAM: PORTABLE CHEST 1 VIEW COMPARISON:  06/09/2019. FINDINGS: Enlarged cardiopericardial silhouette, stable from the prior study. Stable left anterior chest wall AICD. No mediastinal or hilar masses. Prominent bronchovascular markings most evident in the bases. No evidence of pneumonia or pulmonary edema. No pleural effusion or pneumothorax. Skeletal structures are grossly intact. IMPRESSION: No acute cardiopulmonary disease. Electronically Signed   By: Lajean Manes M.D.   On: 06/18/2019 14:14   DG Chest Port 1 View  Result Date: 06/09/2019 CLINICAL DATA:  Pt unable to answer hx questions. Only moaned when Tech questioned her. Per nurse: Pt from [facility]. Reports difficulty breathing since 1100 this AM. Lethargic and SOB. 100% on RA, wheezing heard. 5 mg or albuterol given en route. EXAM: PORTABLE CHEST 1 VIEW COMPARISON:  Chest radiograph 06/05/2019 FINDINGS: Stable cardiomediastinal contours with enlarged heart size.  Left ICD in place. Central pulmonary vascular congestion. Diffuse mild bilateral interstitial opacities likely reflecting trace edema. No new focal consolidation. No pneumothorax or large pleural effusion. No acute finding in the visualized skeleton. IMPRESSION: Cardiomegaly with central pulmonary vascular congestion and mild bilateral interstitial opacities likely reflecting edema. Electronically Signed   By: Audie Pinto M.D.   On: 06/09/2019 15:59   DG CHEST PORT 1 VIEW  Result Date: 06/05/2019 CLINICAL DATA:  Congestive heart failure. EXAM: PORTABLE CHEST 1 VIEW COMPARISON:  Single-view of the chest 05/31/2019.  PA and lateral chest 05/26/2019. FINDINGS: There is cardiomegaly without edema. No consolidative process, pneumothorax or effusion. AICD is in place. IMPRESSION: Cardiomegaly without acute disease. Electronically Signed   By: Inge Rise M.D.   On: 06/05/2019 13:23   DG CHEST PORT 1 VIEW  Result Date: 05/31/2019 CLINICAL DATA:  Cough.  CHF. EXAM: PORTABLE CHEST 1 VIEW COMPARISON:  05/26/2019 FINDINGS: There is a left chest wall ICD with leads in the right atrial appendage and right ventricle. Cardiac enlargement. Aortic atherosclerosis. Pulmonary vascular congestion. No pleural effusion. No airspace opacities. IMPRESSION: 1. Similar appearance of cardiac enlargement and pulmonary vascular congestion. Electronically Signed   By: Kerby Moors M.D.   On: 05/31/2019 19:42   EEG adult  Result Date: 06/07/2019 Lora Havens, MD     06/07/2019 10:45 AM Patient Name: ADLYNN LOWENSTEIN MRN: [id number] Epilepsy Attending: Lora Havens Referring Physician/Provider: Dr Darliss Cheney Date: 06/07/2019 Duration: 22.45mins Patient history: 73yo M with sudden onset of slurred speech and left sided weakness. EEG to evaluate for seizure Level of alertness: awake AEDs during EEG study: None Technical aspects: This EEG study was done with scalp electrodes positioned according to the 10-20  International system of electrode placement. Electrical activity was acquired at a sampling rate of 500Hz  and reviewed with a high frequency filter of 70Hz  and a low frequency filter of 1Hz . EEG data were recorded continuously and digitally stored. DESCRIPTION: No clear posterior dominant rhythm was seen. EEG showed continuous generalized polymorphic 3-6hz  theta-delta slowing. Sharp transient was seen in right frontotemporal region. Hyperventilation and photic stimulation were not performed.  ABNORMALITY - Continuous slow, generalized  IMPRESSION: This study is suggestive of moderate diffuse encephalopathy, non specific to etiology. No seizures or definite epileptiform discharges were seen throughout the recording. If suspicion for interictal activity remains high, consider long term eeg monitoring. Lora Havens    EEG adult  Result Date: 05/31/2019 Lora Havens, MD     05/31/2019  4:41 PM Patient Name: LANEKA MCGRORY MRN: [id number] Epilepsy Attending: Lora Havens Referring Physician/Provider: Dr Rosalin Hawking Date: 05/31/2019 Duration: 26.95mins Patient history: 73yo M with sudden onset of slurred speech and left sided weakness. EEG to evaluate for seizure Level of alertness: awake AEDs during EEG study: none Technical aspects: This EEG study was done with scalp electrodes positioned according to the 10-20 International system of electrode placement. Electrical activity was acquired at a sampling rate of 500Hz  and reviewed with a high frequency filter of 70Hz  and a low frequency filter of 1Hz . EEG data were recorded continuously and digitally stored. DESCRIPTION: No clear posterior dominant rhythm was seen. EEG showed continuous generalized polymorphic 3-6hz  theta-delta slowing. At times, generalized triphasic waves were also noted. Hyperventilation and photic stimulation were not performed. ABNORMALITY - Continuous slow, generalized - Triphasic waves, generalized IMPRESSION: This study is  suggestive of moderate diffuse encephalopathy, non specific to etiology, could be secondary to toxic-metabolic causes. No seizures or definite epileptiform discharges were seen throughout the recording. Priyanka Barbra Sarks   CT HEAD CODE STROKE WO CONTRAST`  Result Date: 05/31/2019 CLINICAL DATA:  Code stroke.  Left-sided weakness EXAM: CT HEAD WITHOUT CONTRAST TECHNIQUE: Contiguous axial images were obtained from the base of the skull through the vertex without intravenous contrast. COMPARISON:  CT head 04/15/2019 FINDINGS: Brain: Ventricle size normal. Small hypodensity anterior limb internal capsule on the left unchanged from the prior study. Negative for acute infarct, hemorrhage, mass. No midline shift. Vascular: Negative for hyperdense vessel  Skull: Negative Sinuses/Orbits: Extensive mucosal edema throughout the paranasal sinuses. Negative orbit. Other: None ASPECTS ([place] Stroke Program Early CT Score) - Ganglionic level infarction (caudate, lentiform nuclei, internal capsule, insula, M1-M3 cortex): 7 - Supraganglionic infarction (M4-M6 cortex): 3 Total score (0-10 with 10 being normal): 10 IMPRESSION: 1. No acute abnormality and no change from the prior study 2. ASPECTS is 10 Electronically Signed   By: Franchot Gallo M.D.   On: 05/31/2019 15:03     Time Spent in minutes  30     Desiree Hane M.D on 06/19/2019 at 12:38 PM  To page go to [email or web address] - password [facility]

## 2019-06-19 NOTE — ED Notes (Signed)
ED TO INPATIENT HANDOFF REPORT  ED Nurse Name and Phone #: Jori Moll [phone]  S Name/Age/Gender Gardiner Ramus 73 y.o. female Room/Bed: 019C/019C  Code Status   Code Status: DNR  Home/SNF/Other Home Patient oriented to: self, place, time and situation Is this baseline? Yes   Triage Complete: Triage complete  Chief Complaint Dizziness [R42]  Triage Note Per [facility] pt coming from home, was discharged from hospital yesterday. Family reports patient lethargic and not eating. Patient alert but slow to respond.     Allergies Allergies  Allergen Reactions  . Strawberry Extract Hives, Itching and Swelling         Level of Care/Admitting Diagnosis ED Disposition    ED Disposition Condition Comment   Admit  Hospital Area: [facility] [[id number]]  Level of Care: Telemetry Medical [104]  I expect the patient will be discharged within 24 hours: No (not a candidate for 5C-Observation unit)  Covid Evaluation: Asymptomatic Screening Protocol (No Symptoms)  Diagnosis: Dizziness [254270]  Admitting Physician: Orene Desanctis [[id number]]  Attending Physician: Orene Desanctis [[id number]]  PT Class (Do Not Modify): Observation [104]  PT Acc Code (Do Not Modify): Observation [10022]       B Medical/Surgery History Past Medical History:  Diagnosis Date  . Chronic anemia   . Chronic systolic CHF (congestive heart failure) ([place])   . CKD (chronic kidney disease), stage IV ([place])   . Former tobacco use   . History of tobacco abuse   . Hyperlipidemia   . Hypertension   . Mitral regurgitation   . Nonischemic cardiomyopathy ([place])    a. EF 10-15% on 11/2012 cath with no significant CAD. b. EF 25% in 2019. b. EF 15% in 12/2018  . PAF (paroxysmal atrial fibrillation) ([place])   . Protein calorie malnutrition ([place])   . Quit consuming alcohol in remote past   . Sinus bradycardia 12/21/2012  . Ventricular tachycardia ([place]) 12/14/2012   Past Surgical History:  Procedure Laterality Date  .  BIOPSY  01/22/2019   Procedure: BIOPSY;  Surgeon: Wilford Corner, MD;  Location: [facility];  Service: Endoscopy;;  . COLONOSCOPY WITH PROPOFOL N/A 03/19/2019   Procedure: COLONOSCOPY WITH PROPOFOL;  Surgeon: Otis Brace, MD;  Location: [facility];  Service: Gastroenterology;  Laterality: N/A;  . ESOPHAGOGASTRODUODENOSCOPY (EGD) WITH PROPOFOL N/A 06/18/2017   Procedure: ESOPHAGOGASTRODUODENOSCOPY (EGD) WITH PROPOFOL;  Surgeon: Ronnette Juniper, MD;  Location: [facility];  Service: Gastroenterology;  Laterality: N/A;  . ESOPHAGOGASTRODUODENOSCOPY (EGD) WITH PROPOFOL N/A 01/22/2019   Procedure: ESOPHAGOGASTRODUODENOSCOPY (EGD) WITH PROPOFOL;  Surgeon: Wilford Corner, MD;  Location: [facility];  Service: Endoscopy;  Laterality: N/A;  . ESOPHAGOGASTRODUODENOSCOPY (EGD) WITH PROPOFOL N/A 03/19/2019   Procedure: ESOPHAGOGASTRODUODENOSCOPY (EGD) WITH PROPOFOL;  Surgeon: Otis Brace, MD;  Location: [facility];  Service: Gastroenterology;  Laterality: N/A;  . IMPLANTABLE CARDIOVERTER DEFIBRILLATOR IMPLANT  12/19/2012   St. Jude dual-chamber ICD, serial number [id number]  . IMPLANTABLE CARDIOVERTER DEFIBRILLATOR IMPLANT N/A 12/19/2012   Procedure: IMPLANTABLE CARDIOVERTER DEFIBRILLATOR IMPLANT;  Surgeon: Deboraha Sprang, MD;  Location: [facility] CATH LAB;  Service: Cardiovascular;  Laterality: N/A;  . LEFT AND RIGHT HEART CATHETERIZATION WITH CORONARY ANGIOGRAM N/A 12/17/2012   Procedure: LEFT AND RIGHT HEART CATHETERIZATION WITH CORONARY ANGIOGRAM;  Surgeon: Sherren Mocha, MD;  Location: [facility] CATH LAB;  Service: Cardiovascular;  Laterality: N/A;  . POLYPECTOMY  03/19/2019   Procedure: POLYPECTOMY;  Surgeon: Otis Brace, MD;  Location: [facility]OSCOPY;  Service: Gastroenterology;;  . RIGHT HEART CATH N/A 05/30/2019   Procedure:  RIGHT HEART CATH;  Surgeon: Jolaine Artist, MD;  Location: [facility] CV LAB;  Service: Cardiovascular;  Laterality: N/A;  . SCLEROTHERAPY  01/22/2019   Procedure:  SCLEROTHERAPY;  Surgeon: Wilford Corner, MD;  Location: [facility] ENDOSCOPY;  Service: Endoscopy;;     A IV Location/Drains/Wounds Patient Lines/Drains/Airways Status   Active Line/Drains/Airways    Name:   Placement date:   Placement time:   Site:   Days:   Peripheral IV 06/18/19 Left Antecubital   06/18/19    1535    Antecubital   1   Pressure Injury 05/29/19 Buttocks Left;Right Stage I -  Intact skin with non-blanchable redness of a localized area usually over a bony prominence.   05/29/19    1105     21   Pressure Injury 05/31/19 Buttocks Left Stage II -  Partial thickness loss of dermis presenting as a shallow open ulcer with a red, pink wound bed without slough.   05/31/19    0800     19          Intake/Output Last 24 hours No intake or output data in the 24 hours ending 06/19/19 0005  Labs/Imaging Results for orders placed or performed during the hospital encounter of 06/18/19 (from the past 48 hour(s))  CBG monitoring, ED     Status: None   Collection Time: 06/18/19 12:35 PM  Result Value Ref Range   Glucose-Capillary 94 70 - 99 mg/dL  Comprehensive metabolic panel     Status: Abnormal   Collection Time: 06/18/19  1:00 PM  Result Value Ref Range   Sodium 135 135 - 145 mmol/L   Potassium 4.0 3.5 - 5.1 mmol/L   Chloride 99 98 - 111 mmol/L   CO2 26 22 - 32 mmol/L   Glucose, Bld 112 (H) 70 - 99 mg/dL   BUN 37 (H) 8 - 23 mg/dL   Creatinine, Ser 2.56 (H) 0.44 - 1.00 mg/dL   Calcium 8.3 (L) 8.9 - 10.3 mg/dL   Total Protein 6.6 6.5 - 8.1 g/dL   Albumin 1.9 (L) 3.5 - 5.0 g/dL   AST 69 (H) 15 - 41 U/L   ALT 29 0 - 44 U/L   Alkaline Phosphatase 108 38 - 126 U/L   Total Bilirubin 1.6 (H) 0.3 - 1.2 mg/dL   GFR calc non Af Amer 18 (L) >60 mL/min   GFR calc Af Amer 20 (L) >60 mL/min   Anion gap 10 5 - 15    Comment: Performed at [facility], [address] [place], [place] [address]  Troponin I (High Sensitivity)     Status: Abnormal   Collection Time: 06/18/19  1:00 PM   Result Value Ref Range   Troponin I (High Sensitivity) 36 (H) <18 ng/L    Comment: (NOTE) Elevated high sensitivity troponin I (hsTnI) values and significant  changes across serial measurements may suggest ACS but many other  chronic and acute conditions are known to elevate hsTnI results.  Refer to the "Links" section for chest pain algorithms and additional  guidance. Performed at [facility], [place] [address], [place], [place] [address]   CBC with Differential     Status: Abnormal   Collection Time: 06/18/19  1:00 PM  Result Value Ref Range   WBC 16.3 (H) 4.0 - 10.5 K/uL   RBC 3.36 (L) 3.87 - 5.11 MIL/uL   Hemoglobin 8.5 (L) 12.0 - 15.0 g/dL    Comment: Reticulocyte Hemoglobin testing may be  clinically indicated, consider ordering this additional test [id number]    HCT 25.9 (L) 36.0 - 46.0 %   MCV 77.1 (L) 80.0 - 100.0 fL   MCH 25.3 (L) 26.0 - 34.0 pg   MCHC 32.8 30.0 - 36.0 g/dL   RDW 17.6 (H) 11.5 - 15.5 %   Platelets 246 150 - 400 K/uL   nRBC 0.0 0.0 - 0.2 %   Neutrophils Relative % 78 %   Neutro Abs 12.8 (H) 1.7 - 7.7 K/uL   Lymphocytes Relative 10 %   Lymphs Abs 1.7 0.7 - 4.0 K/uL   Monocytes Relative 9 %   Monocytes Absolute 1.5 (H) 0.1 - 1.0 K/uL   Eosinophils Relative 1 %   Eosinophils Absolute 0.2 0.0 - 0.5 K/uL   Basophils Relative 1 %   Basophils Absolute 0.1 0.0 - 0.1 K/uL   Immature Granulocytes 1 %   Abs Immature Granulocytes 0.08 (H) 0.00 - 0.07 K/uL    Comment: Performed at [facility], [address], [place], [place] [address]  Ammonia     Status: Abnormal   Collection Time: 06/18/19  1:02 PM  Result Value Ref Range   Ammonia 50 (H) 9 - 35 umol/L    Comment: Performed at [facility], [place] [address] [place], [place] [address]  Troponin I (High Sensitivity)     Status: Abnormal   Collection Time: 06/18/19  3:44 PM  Result Value Ref Range   Troponin I (High Sensitivity) 59 (H) <18 ng/L    Comment: DELTA CHECK NOTED RESULT  CALLED TO, READ BACK BY AND VERIFIED WITH: P JOHNSTON RN AT 1642 ON [id number] BY K FORSYTH (NOTE) Elevated high sensitivity troponin I (hsTnI) values and significant  changes across serial measurements may suggest ACS but many other  chronic and acute conditions are known to elevate hsTnI results.  Refer to the Links section for chest pain algorithms and additional  guidance. Performed at [facility], [place] [address] [place], [place] [address]   SARS CORONAVIRUS 2 (TAT 6-24 HRS) Nasopharyngeal Nasopharyngeal Swab     Status: None   Collection Time: 06/18/19  5:58 PM   Specimen: Nasopharyngeal Swab  Result Value Ref Range   SARS Coronavirus 2 NEGATIVE NEGATIVE    Comment: (NOTE) SARS-CoV-2 target nucleic acids are NOT DETECTED. The SARS-CoV-2 RNA is generally detectable in upper and lower respiratory specimens during the acute phase of infection. Negative results do not preclude SARS-CoV-2 infection, do not rule out co-infections with other pathogens, and should not be used as the sole basis for treatment or other patient management decisions. Negative results must be combined with clinical observations, patient history, and epidemiological information. The expected result is Negative. Fact Sheet for Patients: SugarRoll.be Fact Sheet for Healthcare Providers: https://www.woods-mathews.com/ This test is not yet approved or cleared by the [place] FDA and  has been authorized for detection and/or diagnosis of SARS-CoV-2 by FDA under an Emergency Use Authorization (EUA). This EUA will remain  in effect (meaning this test can be used) for the duration of the COVID-19 declaration under Section 56 4(b)(1) of the Act, 21 U.S.C. section 360bbb-3(b)(1), unless the authorization is terminated or revoked sooner. Performed at [facility], [place] [address], [place], [place] [address]   Urinalysis, Routine w reflex microscopic      Status: Abnormal   Collection Time: 06/18/19  6:45 PM  Result Value Ref Range   Color, Urine YELLOW YELLOW   APPearance  CLEAR CLEAR   Specific Gravity, Urine 1.009 1.005 - 1.030   pH 7.0 5.0 - 8.0   Glucose, UA NEGATIVE NEGATIVE mg/dL   Hgb urine dipstick MODERATE (A) NEGATIVE   Bilirubin Urine NEGATIVE NEGATIVE   Ketones, ur NEGATIVE NEGATIVE mg/dL   Protein, ur NEGATIVE NEGATIVE mg/dL   Nitrite NEGATIVE NEGATIVE   Leukocytes,Ua NEGATIVE NEGATIVE   RBC / HPF 6-10 0 - 5 RBC/hpf   WBC, UA 0-5 0 - 5 WBC/hpf   Bacteria, UA NONE SEEN NONE SEEN   Squamous Epithelial / LPF 0-5 0 - 5   Hyaline Casts, UA PRESENT     Comment: Performed at [facility], [place] [address], [place], [place] [address]   CT Head Wo Contrast  Result Date: 06/18/2019 CLINICAL DATA:  Lethargic and not eating EXAM: CT HEAD WITHOUT CONTRAST TECHNIQUE: Contiguous axial images were obtained from the base of the skull through the vertex without intravenous contrast. COMPARISON:  06/04/2019 FINDINGS: Brain: There is no acute intracranial hemorrhage, mass-effect, or edema. Gray-white differentiation is preserved. There is no extra-axial fluid collection. Ventricles and sulci are within normal limits in size and configuration. Patchy hypoattenuation in the supratentorial white matter is nonspecific but likely reflects stable chronic microvascular ischemic changes. Vascular: There is atherosclerotic calcification at the skull base. Skull: Calvarium is unremarkable. Sinuses/Orbits: Persistent paranasal sinus opacification with areas of high density likely reflecting inspissation. Orbits are unremarkable. Other: None. IMPRESSION: No acute intracranial abnormality. Stable chronic findings detailed above. Electronically Signed   By: Macy Mis M.D.   On: 06/18/2019 13:45   DG Chest Portable 1 View  Result Date: 06/18/2019 CLINICAL DATA:  Discharge from the hospital yesterday. Family reports patient is lethargic and not eating.  Slow to respond. EXAM: PORTABLE CHEST 1 VIEW COMPARISON:  06/09/2019. FINDINGS: Enlarged cardiopericardial silhouette, stable from the prior study. Stable left anterior chest wall AICD. No mediastinal or hilar masses. Prominent bronchovascular markings most evident in the bases. No evidence of pneumonia or pulmonary edema. No pleural effusion or pneumothorax. Skeletal structures are grossly intact. IMPRESSION: No acute cardiopulmonary disease. Electronically Signed   By: Lajean Manes M.D.   On: 06/18/2019 14:14    Pending Labs Unresulted Labs (From admission, onward)    Start     Ordered   06/19/19 0500  CBC  Tomorrow morning,   R     06/18/19 2241   06/19/19 0500  Comprehensive metabolic panel  Tomorrow morning,   R     06/18/19 2331          Vitals/Pain Today's Vitals   06/18/19 2215 06/18/19 2300 06/18/19 2315 06/18/19 2330  BP:  (!) 123/50  (!) 115/44  Pulse:  61  60  Resp:  15  17  Temp:      TempSrc:      SpO2:  96%  96%  Weight:      Height:      PainSc: Asleep  Asleep     Isolation Precautions No active isolations  Medications Medications  meclizine (ANTIVERT) tablet 25 mg (25 mg Oral Given 06/18/19 1438)  sodium chloride 0.9 % bolus 500 mL (0 mLs Intravenous Stopped 06/18/19 1720)  ondansetron (ZOFRAN) injection 4 mg (4 mg Intravenous Given 06/18/19 1534)    Mobility walks with person assist High fall risk   Focused Assessments Pulmonary Assessment Handoff:  Lung sounds:   O2 Device: Room Air        R Recommendations: See Admitting Provider Note  Report  given to:   Additional Notes: Complaining of lethargy, multiple labs are abnormal

## 2019-06-19 NOTE — Evaluation (Signed)
Physical Therapy Evaluation Patient Details Name: Stephanie Yates MRN: [id number] DOB: 27-Mar-1946 Today's Date: 06/19/2019   History of Present Illness  73yo female very well known to PT due to multiple recent admits secondary to acute on chronic CHF as well as possible CVA that was unable to be confirmed on MRI due to PPM. Discharged home 12/21, now returns to ED due to dizziness, found to be hypotensive and hypovolemic in ED. PMH CHF with EJ 10-15%, CKD, HLD, HTN, mitral regurg, cardiomyopathy, A-fib, VT s/p AICD/PPM, cardiac cath, GIB  Clinical Impression   Patient received sitting at EOB with RN present and attending, reports patient had quite a bit of difficulty standing with her earlier this morning. Stephanie Yates is very well known to PT at this time due to recent course of multiple hospitalizations. Did not have +2 assist available this morning (necessary due to patient's tendency to quickly fatigue and impulsively sit down), so used stedy for mobility- able to perform functional transfers with MinA in stedy. She was left up in the chair with all needs met, chair alarm active. Again, emphatically recommending SNF and 24/7A due to fragile medical status and difficulty with mobility however anticipate that patient and family will unfortunately continue to refuse.   Patient suffers from gross weakness, poor balance, reduced functional activity tolerance, and cardiopulmonary limitations which impairs their ability to perform daily activities like safely ambulate in the home.  A walker alone will not resolve the issues with performing activities of daily living. A wheelchair will allow patient to safely perform daily activities.  The patient can self propel in the home or has a caregiver who can provide assistance.        Follow Up Recommendations SNF;Supervision/Assistance - 24 hour;Other (comment)(if patient/family refuses SNF, will require HHPT, 24/7A, and equipment as below)    Equipment  Recommendations  Rolling walker with 5" wheels;3in1 (PT);Wheelchair (measurements PT);Wheelchair cushion (measurements PT)    Recommendations for Other Services       Precautions / Restrictions Precautions Precautions: Fall Precaution Comments: grossly weak, fatigues quickly, ejection fraction 10-15% Restrictions Weight Bearing Restrictions: No      Mobility  Bed Mobility               General bed mobility comments: sitting at EOB  Transfers Overall transfer level: Needs assistance Equipment used: Ambulation equipment used Transfers: Sit to/from Stand Sit to Stand: Min assist Stand pivot transfers: Total assist       General transfer comment: no second person available for safety this morning so used stedy- able to stand with MinA in stedy, then totalA transfer to chair in this lift  Ambulation/Gait         Gait velocity: decreased   General Gait Details: deferred- will need +2 for safety and chair follow due to high fatigue levels and patient tendency to impulsively sit  Stairs            Wheelchair Mobility    Modified Rankin (Stroke Patients Only) Modified Rankin (Stroke Patients Only) Pre-Morbid Rankin Score: Slight disability Modified Rankin: Moderately severe disability     Balance Overall balance assessment: Needs assistance Sitting-balance support: Bilateral upper extremity supported;Feet supported Sitting balance-Leahy Scale: Good Sitting balance - Comments: S for sitting at EOB   Standing balance support: Bilateral upper extremity supported;During functional activity Standing balance-Leahy Scale: Poor Standing balance comment: reliant on external support  Pertinent Vitals/Pain Pain Assessment: No/denies pain Pain Score: 0-No pain Pain Intervention(s): Limited activity within patient's tolerance;Monitored during session    [place] expects to be discharged to:: Private  residence Living Arrangements: Alone Available Help at Discharge: Family;Available PRN/intermittently Type of Home: House Home Access: Stairs to enter Entrance Stairs-Rails: Can reach both Entrance Stairs-Number of Steps: 3 Home Layout: One level Home Equipment: Walker - 2 wheels Additional Comments: did not use AD    Prior Function Level of Independence: Independent         Comments: pt reports being independent with all ADLs including cooking and housework, denies any recent falls     Hand Dominance   Dominant Hand: Right    Extremity/Trunk Assessment   Upper Extremity Assessment Upper Extremity Assessment: Generalized weakness    Lower Extremity Assessment Lower Extremity Assessment: Generalized weakness    Cervical / Trunk Assessment Cervical / Trunk Assessment: Kyphotic  Communication   Communication: Other (comment)(mumbling/limited voice projection, difficult to understand)  Cognition Arousal/Alertness: Awake/alert Behavior During Therapy: Flat affect Overall Cognitive Status: Impaired/Different from baseline Area of Impairment: Attention;Following commands;Safety/judgement;Awareness;Memory;Problem solving                 Orientation Level: Person;Place;Time;Situation Current Attention Level: Sustained Memory: Decreased short-term memory Following Commands: Follows one step commands consistently;Follows one step commands with increased time Safety/Judgement: Decreased awareness of deficits;Decreased awareness of safety Awareness: Emergent Problem Solving: Slow processing;Decreased initiation;Difficulty sequencing;Requires verbal cues;Requires tactile cues General Comments: cognition improved since last session with PT but remains impaired; speech remains slightly garbled so can be difficult to understand at times, does participate and follow cues well      General Comments General comments (skin integrity, edema, etc.): VSS on room air, BP in chair  137/57    Exercises     Assessment/Plan    PT Assessment Patient needs continued PT services  PT Problem List Decreased strength;Decreased mobility;Decreased activity tolerance;Decreased balance;Pain;Decreased safety awareness;Decreased knowledge of use of DME       PT Treatment Interventions DME instruction;Gait training;Stair training;Functional mobility training;Therapeutic activities;Therapeutic exercise;Balance training;Patient/family education;Neuromuscular re-education    PT Goals (Current goals can be found in the Care Plan section)  Acute Rehab PT Goals Patient Stated Goal: go home PT Goal Formulation: With patient Time For Goal Achievement: 07/14/2019 Potential to Achieve Goals: Fair    Frequency Min 3X/week   Barriers to discharge        Co-evaluation               AM-PAC PT "6 Clicks" Mobility  Outcome Measure Help needed turning from your back to your side while in a flat bed without using bedrails?: A Little Help needed moving from lying on your back to sitting on the side of a flat bed without using bedrails?: A Little Help needed moving to and from a bed to a chair (including a wheelchair)?: A Little Help needed standing up from a chair using your arms (e.g., wheelchair or bedside chair)?: A Lot Help needed to walk in hospital room?: A Lot Help needed climbing 3-5 steps with a railing? : Total 6 Click Score: 14    End of Session Equipment Utilized During Treatment: Gait belt Activity Tolerance: Patient tolerated treatment well Patient left: in chair;with call bell/phone within reach;with chair alarm set Nurse Communication: Mobility status PT Visit Diagnosis: Muscle weakness (generalized) (M62.81);Difficulty in walking, not elsewhere classified (R26.2);Other symptoms and signs involving the nervous system (R29.898) Hemiplegia - Right/Left: Left Hemiplegia - dominant/non-dominant: Non-dominant  Hemiplegia - caused by: Unspecified    Time:  1580-6386 PT Time Calculation (min) (ACUTE ONLY): 28 min   Charges:   PT Evaluation $PT Eval Moderate Complexity: 1 Mod PT Treatments $Therapeutic Activity: 8-22 mins        Windell Norfolk, DPT, PN1   Supplemental Physical Therapist [place]    Pager [phone] Acute Rehab Office [phone]

## 2019-06-19 NOTE — Care Management (Signed)
1313 06-19-19 Case Manager received call from [facility] that patient's family refused the hospital bed to be delivered to the home because she was being admitted back into the hospital. Not sure of plan of care at this time; however, if patient decides to return home please call [facility] because the hospital bed will be on hold. No further needs from this Case Manager. Bethena Roys, RN,BSN Case Manager [phone]

## 2019-06-19 NOTE — Progress Notes (Signed)
[facility]  PROGRESS NOTE  Stephanie Yates:[id number] DOB: [birth year] DOA: 06/18/2019 PCP: Josetta Huddle, MD Admit date - 06/18/2019   Admitting Physician Desiree Hane, MD  Outpatient Primary MD for the patient is Josetta Huddle, MD  LOS - 0 Brief Narrative   [name] is a 73 y.o. year old female with medical history significant for CHF/NICM (EF 10 to 15%), VT s/p [id number] and biventricular PPM, chronic anemia, CKD-4, HTN, HLD, moderate protein calorie malnutrition and GIB, prolonged QT/torsade with recent hospitalization from 12/13-12/21 who presented on 06/18/2019 with lethargy, decreased eating, decreased strength, and increased confusion and was found to have for CHF exacerbation and hepatic encephalopathy requiring lactulose.   Subjective  Stephanie Yates today feels tired. No chest pain or shortness of breath. No abdominal pain.  A & P    Lethargy, unclear etiology.  Ammonia not elevated. Has chronic comorbidities.  Likely related to advanced end-stage CHF as well as cirrhosis with possible ascites, CKD with AKI. -Check TSH -Address multiple comorbidities below -Reconsult palliative care to continue goals of care discussion given recurrent hospitalization  Hypotension, persistent.  Most recent SBP 101/48 despite holding home diuretics overnight.  Orthostatic vitals on admission were negative. Suspect related to decreased oral intake/appetite in setting of multiple comorbidities; also has cirrhosis with concern for ascites that could be contributing.   Could also be evidence of worsening heart disease leading to decreased output. No improvement with monitoring and patient continues to have minimal PO intake -Continue to hold home diuretics -gentle diuresis with IVF -Check BNP -Right upper quadrant ultrasound for evaluation of ascites -We change from observation status and admit as inpatient given above  AKI on CKD Stage 4, likely pre-renal from decreased PO  intake complicated by hypotension presumed related to diuretics. Doesn't look volume up.  Baseline creatinine 1.7-2, 2.56 on admission, currently 2.48. Did not improve while holding home diuretics overnight -Continue to hold home diuretics -Give gentle IVF given CHF for additional 24 hours and reassess BMP in a.m. -No ACE/ARB/ARN I CKD stage IV -Not a candidate for advanced therapies previous heart failure evaluation by cardiology  CHFrEF, NYAHA IV, euvolemic. Advanced end stage CHF, not a candidate for advanced CHF therapies on last cardiology evaluation while inpatient on 12/18.  Abdomen distended but no prominent JVD on exam, unclear hypotension related to hypovolemia or diminished output -Check BNP -Hold diuresis for now given hypotension -Daily weights, strict I's and O's -Continue Coreg 3.125 twice daily  Mild troponin elevation  Troponin of 36--59.  Nonischemic EKG  Cirrhosis with history of ascites Currently has abdominal distention.  Unclear if this related to potential CHF or baseline, patient reports this is worse than typical.  Ammonia slightly up at 44. -Continue lactulose -Obtain right upper quadrant ultrasound to assess for any recurrent ascites that may require therapeutic paracenteses  GERD, stable -continue Protonix  Paroxysmal atrial fibrillation, currently rate controlled History of ventricular tachycardia status post AICD in 2014 a biventricular pacemaker.  No arrhythmias on telemetry -Continue amiodarone, mexiletine and Eliquis -monitor on telemetry  Hyperlipidemia, stable -Continue atorvastatin  Chronic microcytic anemia, stable.  Consistent with iron deficiency anemia based on most recent iron panel Hemoglobin stable at baseline 7-8 No signs or symptoms of bleeding -Check CBC daily -Continue oral iron  Moderate protein calorie malnutrition   Debility PT recommends SNF Daughter is agreeable if patient becomes agreeable Patient reports bad experience  with [facility] facility in the past    Family Communication  :  Spoke with daughter [name] at [phone], discussed with mom for recommendation and plan for today, she is amenable to skilled nursing facility if her mom is agreeable, she understands her mom's heart is very weak and has a guarded prognosis given her hypotension and kidney dysfunction  Code Status : DNR  Disposition Plan  : Patient has AKI on CKD currently trying to determine if related to worsening heart function versus decreased oral intake related to lethargy, currently holding diuretics, will give additional small IV fluids given diminished oral intake, check BMP to ensure no concurrent CHF extubation  Consults  : None  Procedures  : None  DVT Prophylaxis  : Eliquis  Lab Results  Component Value Date   PLT 248 06/19/2019    Diet :  Diet Order            Diet Heart Room service appropriate? Yes; Fluid consistency: Thin  Diet effective now               Inpatient Medications Scheduled Meds: . amiodarone  200 mg Oral BID  . apixaban  2.5 mg Oral BID  . atorvastatin  40 mg Oral q1800  . carvedilol  3.125 mg Oral BID WC  . ferrous gluconate  324 mg Oral BID  . lactulose  20 g Oral BID  . magnesium oxide  400 mg Oral q morning - 10a  . mexiletine  200 mg Oral BID  . pantoprazole  40 mg Oral Daily   Continuous Infusions: . sodium chloride     PRN Meds:.acetaminophen  Antibiotics  :   Anti-infectives (From admission, onward)   None       Objective   Vitals:   06/19/19 0450 06/19/19 1159 06/19/19 1300 06/19/19 1552  BP: (!) 125/58 (!) 115/54 (!) 101/48   Pulse: 66 70    Resp: (!) 24 (!) 21  20  Temp:  97.8 F (36.6 C)  98.1 F (36.7 C)  TempSrc:  Oral  Oral  SpO2: 99% 98%    Weight: 56.5 kg     Height:        SpO2: 98 %  Wt Readings from Last 3 Encounters:  06/19/19 56.5 kg  06/17/19 57.3 kg  06/07/19 61.9 kg     Intake/Output Summary (Last 24 hours) at 06/19/2019  1700 Last data filed at 06/19/2019 1359 Gross per 24 hour  Intake 360 ml  Output --  Net 360 ml    Physical Exam:  Awake Alert, oriented to self, place, context Slow speech but follows all commands with no appreciable deficits [place].AT, No appreciable JVD Symmetrical Chest wall movement, Good air movement bilaterally on room air RRR,No Gallops,Rubs or new Murmurs,  +ve B.Sounds, Abd Soft and distended with  no tenderness, No rebound, guarding or rigidity. No edema   I have personally reviewed the following:   Data Reviewed:  CBC Recent Labs  Lab 06/13/19 0404 06/14/19 0420 06/15/19 0433 06/16/19 0401 06/18/19 1300 06/19/19 0541  WBC 14.9* 15.1* 15.6* 15.7* 16.3* 16.8*  HGB 7.5* 7.7* 8.2* 7.7* 8.5* 7.8*  HCT 23.5* 24.1* 25.1* 23.5* 25.9* 23.9*  PLT 233 253 229 259 246 248  MCV 78.9* 78.5* 77.5* 76.3* 77.1* 76.8*  MCH 25.2* 25.1* 25.3* 25.0* 25.3* 25.1*  MCHC 31.9 32.0 32.7 32.8 32.8 32.6  RDW 16.9* 17.0* 17.1* 17.5* 17.6* 17.8*  LYMPHSABS 2.0  --   --  1.8 1.7  --   MONOABS 1.4*  --   --  1.4*  1.5*  --   EOSABS 0.2  --   --  0.2 0.2  --   BASOSABS 0.1  --   --  0.1 0.1  --     Chemistries  Recent Labs  Lab 06/15/19 0433 06/16/19 0401 06/16/19 1436 06/17/19 0607 06/18/19 1300 06/19/19 0541  NA 141 140  --  138 135 138  K 4.1 3.8  --  4.0 4.0 3.9  CL 105 103  --  102 99 103  CO2 29 27  --  27 26 24   GLUCOSE 112* 102*  --  82 112* 78  BUN 27* 28*  --  31* 37* 37*  CREATININE 1.90* 1.96*  --  1.87* 2.56* 2.48*  CALCIUM 8.3* 8.1*  --  8.1* 8.3* 8.2*  MG  --   --  2.0  --   --   --   AST  --  63*  --   --  69* 60*  ALT  --  30  --   --  29 28  ALKPHOS  --  112  --   --  108 104  BILITOT  --  0.9  --   --  1.6* 1.6*   ------------------------------------------------------------------------------------------------------------------ No results for input(s): CHOL, HDL, LDLCALC, TRIG, CHOLHDL, LDLDIRECT in the last 72 hours.  Lab Results  Component Value  Date   HGBA1C 5.6 05/31/2019   ------------------------------------------------------------------------------------------------------------------ No results for input(s): TSH, T4TOTAL, T3FREE, THYROIDAB in the last 72 hours.  Invalid input(s): FREET3 ------------------------------------------------------------------------------------------------------------------ No results for input(s): VITAMINB12, FOLATE, FERRITIN, TIBC, IRON, RETICCTPCT in the last 72 hours.  Coagulation profile No results for input(s): INR, PROTIME in the last 168 hours.  No results for input(s): DDIMER in the last 72 hours.  Cardiac Enzymes No results for input(s): CKMB, TROPONINI, MYOGLOBIN in the last 168 hours.  Invalid input(s): CK ------------------------------------------------------------------------------------------------------------------    Component Value Date/Time   BNP 2,039.7 (H) 06/09/2019 1541   BNP 367.0 (H) 03/16/2016 1457    Micro Results Recent Results (from the past 240 hour(s))  Culture, Urine     Status: Abnormal   Collection Time: 06/09/19  8:06 PM   Specimen: Urine, Clean Catch  Result Value Ref Range Status   Specimen Description URINE, CLEAN CATCH  Final   Special Requests NONE  Final   Culture (A)  Final    <10,000 COLONIES/mL INSIGNIFICANT GROWTH Performed at [facility], [place] [address], [place], [place] [address]    Report Status 06/11/2019 FINAL  Final  SARS CORONAVIRUS 2 (TAT 6-24 HRS) Nasopharyngeal Nasopharyngeal Swab     Status: None   Collection Time: 06/18/19  5:58 PM   Specimen: Nasopharyngeal Swab  Result Value Ref Range Status   SARS Coronavirus 2 NEGATIVE NEGATIVE Final    Comment: (NOTE) SARS-CoV-2 target nucleic acids are NOT DETECTED. The SARS-CoV-2 RNA is generally detectable in upper and lower respiratory specimens during the acute phase of infection. Negative results do not preclude SARS-CoV-2 infection, do not rule out co-infections with  other pathogens, and should not be used as the sole basis for treatment or other patient management decisions. Negative results must be combined with clinical observations, patient history, and epidemiological information. The expected result is Negative. Fact Sheet for Patients: SugarRoll.be Fact Sheet for Healthcare Providers: https://www.woods-mathews.com/ This test is not yet approved or cleared by the [place] FDA and  has been authorized for detection and/or diagnosis of SARS-CoV-2 by FDA under an Emergency Use Authorization (EUA). This EUA will  remain  in effect (meaning this test can be used) for the duration of the COVID-19 declaration under Section 56 4(b)(1) of the Act, 21 U.S.C. section 360bbb-3(b)(1), unless the authorization is terminated or revoked sooner. Performed at [facility], [place] [address] [place], [place] [address]     Radiology Reports CT ABDOMEN PELVIS WO CONTRAST  Result Date: 06/14/2019 CLINICAL DATA:  Acute generalized abdominal pain. EXAM: CT ABDOMEN AND PELVIS WITHOUT CONTRAST TECHNIQUE: Multidetector CT imaging of the abdomen and pelvis was performed following the standard protocol without IV contrast. COMPARISON:  06/01/2019 FINDINGS: Lower chest: Mild bibasilar atelectasis. Hepatobiliary: Findings suspicious for hepatic cirrhosis are again demonstrated. No liver masses identified on this unenhanced exam. Gallbladder is unremarkable. No evidence of biliary ductal dilatation. Pancreas: No mass or inflammatory process visualized on this unenhanced exam. Spleen:  Within normal limits in size. Adrenals/Urinary tract: Stable 3.5 cm low-attenuation right adrenal mass, consistent with benign adenoma. A few tiny less than 5 mm renal calculi are again seen bilaterally, however there is no evidence of ureteral calculi or hydronephrosis. Unremarkable unopacified urinary bladder. Stomach/Bowel: No evidence of  obstruction, inflammatory process, or abnormal fluid collections. Diverticulosis is seen mainly involving the descending and sigmoid colon, however there is no evidence of diverticulitis. Vascular/Lymphatic: No pathologically enlarged lymph nodes identified. No evidence of abdominal aortic aneurysm. Aortic atherosclerosis incidentally noted. Reproductive: Small uterine fibroids some of which are calcified are again noted. Adnexal regions are unremarkable. Other: Diffuse mesenteric and body wall edema, and mild ascites, have mildly increased since previous study. Musculoskeletal:  No suspicious bone lesions identified. IMPRESSION: Mild increase in mild ascites and diffuse mesenteric and body wall edema since prior study. No focal inflammatory process or abscess identified. Probable hepatic cirrhosis. Colonic diverticulosis, without radiographic evidence of diverticulitis. Bilateral nephrolithiasis. No evidence of ureteral calculi or hydronephrosis. Stable small uterine fibroids. Stable benign right adrenal adenoma. Aortic Atherosclerosis (ICD10-I70.0). Electronically Signed   By: Marlaine Hind M.D.   On: 06/14/2019 18:21   CT ABDOMEN PELVIS WO CONTRAST  Result Date: 06/01/2019 CLINICAL DATA:  Abdominal pain.  Concern for diverticulitis. EXAM: CT ABDOMEN AND PELVIS WITHOUT CONTRAST TECHNIQUE: Multidetector CT imaging of the abdomen and pelvis was performed following the standard protocol without IV contrast. COMPARISON:  05/26/2019 FINDINGS: The lack of intravenous contrast limits the ability to evaluate solid abdominal organs. Lower chest: Limited visualization of lower thorax demonstrates minimal bibasilar heterogeneous opacities, left greater than right, improved compared to the 11/29 examination. No pleural effusion. Cardiomegaly. Pacer leads again terminate within the right atrium and ventricle. No pericardial effusion. Hepatobiliary: Nodularity hepatic contour. There is diffuse decreased attempt increased  attenuation of the hepatic parenchyma as could be seen in the setting of chronic amiodarone therapy. Trace amount of perihepatic ascites, unchanged to slightly progressed compared to the 05/26/2019 examination. Normal noncontrast appearance of the gallbladder. No radiopaque gallstones. Pancreas: Normal noncontrast appearance of the pancreas. Spleen: Normal noncontrast appearance of the spleen. Adrenals/Urinary Tract: The bilateral kidneys again appear atrophic. Punctate nonobstructing renal stones are again seen bilaterally with dominant nonobstructing stone within the inferior pole the right kidney measuring approximately 4 mm in diameter (image 38, series 3) and dominant nonobstructing stone within the inferior pole the left kidney measuring approximately 2 mm (image 36, series 3). No renal stones are seen along the expected course of either ureter. Excreted contrast from previous contrast enhance head and neck CTA is seen within the urinary bladder. No urinary obstruction or perinephric stranding. Redemonstrated approximately 3.8  x 1.9 cm hypoattenuating right-sided adrenal adenoma. Normal noncontrast appearance of the left adrenal gland. Stomach/Bowel: Ingested enteric contrast extends to the level of the hepatic flexure of the colon. Redemonstrated small right-sided inguinal hernia which is noted to contain a short-segment of nondilated small bowel (representative coronal images 39 through 48, series 6), not resulting in enteric obstruction. Moderate colonic stool burden. Scattered colonic diverticulosis without evidence of superimposed acute diverticulitis. Normal appearance of the terminal ileum. The appendix is not visualized, however there is no definitive pericecal inflammatory change on this noncontrast examination. No pneumoperitoneum, pneumatosis or portal venous gas. Vascular/Lymphatic: Atherosclerotic plaque within normal caliber abdominal aorta. No bulky retroperitoneal, mesenteric, pelvic or  inguinal lymphadenopathy. Reproductive: Dystrophic calcifications, presumed degenerating fibroids within and expectedly atrophic uterus. No discrete adnexal lesion. Trace amount of fluid in the pelvic cul-de-sac. Other: Moderate amount of diffuse body wall anasarca potentially secondary to third spacing. Musculoskeletal: No acute or aggressive osseous abnormalities. Grade 1 anterolisthesis of L4 upon L5 and L5 upon S1 without associated pars defects. A bone island is noted within the left acetabulum. IMPRESSION: 1. No explanation for patient's abdominal pain. Specifically, no evidence enteric obstruction or diverticulitis on this noncontrast examination. 2. Redemonstrated right inguinal hernia containing nondilated loops of small bowel and not resulting in enteric obstruction. 3. Nonobstructive bilateral nephrolithiasis unchanged. 4. Cardiomegaly with diffuse body wall anasarca as could be seen in the setting early CHF. Clinical correlation is advised. 5. Suspected early cirrhotic change with minimal amount perihepatic ascites. 6.  Aortic Atherosclerosis (ICD10-I70.0). Electronically Signed   By: Sandi Mariscal M.D.   On: 06/01/2019 12:08   CT ABDOMEN PELVIS WO CONTRAST  Result Date: 05/26/2019 CLINICAL DATA:  73 year old female with history of abdominal distension. Diffuse abdominal pain, most severe on the right side. EXAM: CT ABDOMEN AND PELVIS WITHOUT CONTRAST TECHNIQUE: Multidetector CT imaging of the abdomen and pelvis was performed following the standard protocol without IV contrast. COMPARISON:  CT the abdomen and pelvis 03/17/2019. FINDINGS: Lower chest: Mild scarring in the visualized lung bases. Cardiomegaly. Atherosclerotic calcifications in the left anterior descending, left circumflex and right coronary arteries. Pacemaker leads terminating in the right atrium and right ventricular apex. Hepatobiliary: Liver has a slightly shrunken appearance and nodular contour, suggesting underlying cirrhosis. No  definite suspicious cystic or solid hepatic lesions are confidently identified on today's noncontrast CT examination. Unenhanced appearance of the gallbladder is normal. Pancreas: No definite pancreatic mass or peripancreatic fluid collections or inflammatory changes are noted on today's noncontrast CT examination. Spleen: Unremarkable. Adrenals/Urinary Tract: Multiple nonobstructive calculi are noted within the collecting systems of both kidneys, largest of which measures up to 4 mm in the lower pole collecting system of the right kidney. No additional calculi are confidently identified along the course of either ureter or within the lumen of the urinary bladder. No hydroureteronephrosis. Unenhanced appearance of the kidneys is otherwise normal. Left adrenal gland is normal in appearance. 4.5 x 2.1 cm low-attenuation (-27 HU) right adrenal nodule, similar to prior studies, compatible with an adrenal myelolipoma. Unenhanced appearance of the urinary bladder is normal. Stomach/Bowel: Unenhanced appearance of the stomach is normal. No pathologic dilatation of small bowel or colon. Multiple loops of small bowel appear to extend into a large right inguinal hernia. The appendix is not confidently identified and may be surgically absent. Regardless, there are no inflammatory changes noted adjacent to the cecum to suggest the presence of an acute appendicitis at this time. Numerous colonic diverticulae are noted, without surrounding inflammatory changes  to suggest an acute diverticulitis at this time. Vascular/Lymphatic: Aortic atherosclerosis. No lymphadenopathy noted in the abdomen or pelvis confidently identified on today's noncontrast CT examination. Reproductive: Uterus is markedly heterogeneous in appearance with multiple coarse calcifications, compatible with a fibroid uterus. Ovaries are unremarkable in appearance. Other: Trace volume of ascites.  No pneumoperitoneum. Musculoskeletal: There are no aggressive  appearing lytic or blastic lesions noted in the visualized portions of the skeleton. IMPRESSION: 1. Large right inguinal hernia containing several loops of small bowel, without evidence of bowel incarceration or obstruction. 2. Numerous small nonobstructive calculi are noted in the collecting systems of both kidneys measuring up to 4 mm in the lower pole. No ureteral stones or findings of urinary tract obstruction are noted at this time. 3. Colonic diverticulosis without evidence of acute diverticulitis at this time. 4. Severe cardiomegaly. 5. Aortic atherosclerosis, in addition to least 3 vessel coronary artery disease. Assessment for potential risk factor modification, dietary therapy or pharmacologic therapy may be warranted, if clinically indicated. 6. Morphologic changes in the liver suggestive of underlying cirrhosis. 7. Additional incidental findings, as above. Electronically Signed   By: Vinnie Langton M.D.   On: 05/26/2019 18:27   CT ANGIO HEAD W OR WO CONTRAST  Result Date: 05/31/2019 CLINICAL DATA:  Left-sided weakness EXAM: CT ANGIOGRAPHY HEAD AND NECK TECHNIQUE: Multidetector CT imaging of the head and neck was performed using the standard protocol during bolus administration of intravenous contrast. Multiplanar CT image reconstructions and MIPs were obtained to evaluate the vascular anatomy. Carotid stenosis measurements (when applicable) are obtained utilizing NASCET criteria, using the distal internal carotid diameter as the denominator. CONTRAST:  169mL OMNIPAQUE IOHEXOL 350 MG/ML SOLN COMPARISON:  None. FINDINGS: CTA NECK FINDINGS Aortic arch: Great vessel origins are patent. Right carotid system: Common, internal, and external carotid arteries are patent. There is mild calcified plaque along the common carotid. Mild calcified plaque is present at the ICA origin, noting streak artifact through this region. There is no hemodynamically significant stenosis or evidence of dissection. Left  carotid system: Common, internal, and external carotid arteries are patent. Mild calcified plaque is present along the common carotid. There is primarily calcified plaque at the bifurcation and ICA origin causing minimal stenosis, noting streak artifact through this region. There is no hemodynamically significant stenosis or evidence of dissection. Vertebral arteries: Dominant right vertebral artery is patent. Calcified plaque at its origin results in mild stenosis. Congenitally small caliber left vertebral artery is patent. Skeleton: Advanced degenerative changes of the cervical spine particularly from C4-C5 through C6-C7. Other neck: No neck mass or adenopathy. Diffuse paranasal sinus opacification with areas of hyperdensity likely reflecting inspissation with fungal colonization not excluded. Upper chest: No apical lung mass. Review of the MIP images confirms the above findings CTA HEAD FINDINGS Anterior circulation: Intracranial internal carotid arteries are patent with calcified plaque along cavernous and paraclinoid portions causing mild stenosis. Anterior and middle cerebral arteries are patent. Posterior circulation: Intracranial vertebral arteries are patent. The left vertebral artery terminates as a PICA. Basilar artery is patent. Posterior cerebral arteries are patent. Posterior communicating arteries are present. Venous sinuses: Patent as permitted by contrast timing Anatomic variants: Fetal origin of the right PCA. Review of the MIP images confirms the above findings IMPRESSION: No proximal intracranial vessel occlusion. No occlusion or hemodynamically significant stenosis in the neck. Electronically Signed   By: Macy Mis M.D.   On: 05/31/2019 15:56   DG Chest 2 View  Result Date: 05/26/2019 CLINICAL DATA:  Patient  with right-sided abdominal pain. History of CHF. EXAM: CHEST - 2 VIEW COMPARISON:  Chest radiograph 02/20/2019 FINDINGS: Multi lead AICD device overlies the left hemithorax. Leads  are stable in position. Stable cardiomegaly. Tortuosity of the thoracic aorta. Heterogeneous opacities left lung base. Pulmonary vascular redistribution. Trace bilateral pleural effusions. Thoracic spine degenerative changes. IMPRESSION: Cardiomegaly with pulmonary vascular redistribution and mild interstitial edema. Heterogeneous opacities left lung base may represent atelectasis. Probable trace bilateral pleural effusions. Electronically Signed   By: Lovey Newcomer M.D.   On: 05/26/2019 20:01   DG Chest 2 View  Result Date: 05/23/2019 CLINICAL DATA:  Shortness of breath EXAM: CHEST - 2 VIEW COMPARISON:  Radiograph 05/05/2019 FINDINGS: Cardiomegaly is similar to slightly increased from comparison AP radiography. Pacer/defibrillator pack overlies the left chest wall leads in the cardiac apex and right atrium. There is a mildly increased interstitial opacity with fissural and septal thickening and indistinct pulmonary vascularity. No consolidative opacity. Atelectatic changes in the lung bases. No visible pneumothorax or effusion. The osseous structures appear diffusely demineralized which may limit detection of small or nondisplaced fractures. No acute osseous or soft tissue abnormality. Degenerative changes are present in the imaged spine and shoulders. IMPRESSION: 1. Cardiomegaly with mild interstitial pulmonary edema, similar to slightly increased from prior study. 2. Bibasilar atelectasis. No consolidation. Electronically Signed   By: Lovena Le M.D.   On: 05/23/2019 01:17   CT Head Wo Contrast  Result Date: 06/18/2019 CLINICAL DATA:  Lethargic and not eating EXAM: CT HEAD WITHOUT CONTRAST TECHNIQUE: Contiguous axial images were obtained from the base of the skull through the vertex without intravenous contrast. COMPARISON:  06/04/2019 FINDINGS: Brain: There is no acute intracranial hemorrhage, mass-effect, or edema. Gray-white differentiation is preserved. There is no extra-axial fluid collection.  Ventricles and sulci are within normal limits in size and configuration. Patchy hypoattenuation in the supratentorial white matter is nonspecific but likely reflects stable chronic microvascular ischemic changes. Vascular: There is atherosclerotic calcification at the skull base. Skull: Calvarium is unremarkable. Sinuses/Orbits: Persistent paranasal sinus opacification with areas of high density likely reflecting inspissation. Orbits are unremarkable. Other: None. IMPRESSION: No acute intracranial abnormality. Stable chronic findings detailed above. Electronically Signed   By: Macy Mis M.D.   On: 06/18/2019 13:45   CT HEAD WO CONTRAST  Result Date: 06/04/2019 CLINICAL DATA:  Left-sided weakness EXAM: CT HEAD WITHOUT CONTRAST TECHNIQUE: Contiguous axial images were obtained from the base of the skull through the vertex without intravenous contrast. COMPARISON:  06/01/2019 FINDINGS: Brain: There is no acute intracranial hemorrhage, mass-effect, or edema. There is no new loss of gray-white differentiation. Patchy hypoattenuation in the supratentorial white matter is nonspecific but likely reflects stable chronic microvascular ischemic changes. There is no extra-axial fluid collection. Ventricles and sulci are stable in size and configuration. Vascular: There is atherosclerotic calcification at the skull base. Skull: Calvarium is unremarkable. Sinuses/Orbits: Persistent diffuse polypoid mucosal thickening with areas of increased density likely reflecting inspissation. Other: None. IMPRESSION: No significant change since 06/01/2019. Electronically Signed   By: Macy Mis M.D.   On: 06/04/2019 14:21   CT HEAD WO CONTRAST  Result Date: 06/01/2019 CLINICAL DATA:  Encephalopathy. Follow-up for question of left basal ganglia region stroke. EXAM: CT HEAD WITHOUT CONTRAST TECHNIQUE: Contiguous axial images were obtained from the base of the skull through the vertex without intravenous contrast. COMPARISON:  CT  05/31/2019, 05/30/2019 and 04/15/2019. FINDINGS: Brain: No acute finding. The brainstem and cerebellum appear normal. Cerebral hemispheres show mild chronic small-vessel ischemic  change of the white matter. This includes an area of low-density in the anterior limb internal capsule on the left which was present in October in therefore not acute. No sign of acute infarction, mass lesion, hemorrhage, hydrocephalus or extra-axial collection. Vascular: There is atherosclerotic calcification of the major vessels at the base of the brain. Skull: Negative Sinuses/Orbits: Widespread sinus opacification consistent with rhinosinusitis. Orbits negative. Other: None IMPRESSION: No acute intracranial finding. Chronic small-vessel ischemic changes of the white matter. Low-density in the anterior limb internal capsule on the left question as acute on the recent study was, I think, present on 04/15/2019 and therefore not acute. Widespread rhinosinusitis. Electronically Signed   By: Nelson Chimes M.D.   On: 06/01/2019 11:12   CT HEAD WO CONTRAST  Result Date: 05/30/2019 CLINICAL DATA:  Vertigo EXAM: CT HEAD WITHOUT CONTRAST TECHNIQUE: Contiguous axial images were obtained from the base of the skull through the vertex without intravenous contrast. COMPARISON:  04/15/2019 FINDINGS: Brain: No acute intracranial abnormality. Specifically, no hemorrhage, hydrocephalus, mass lesion, acute infarction, or significant intracranial injury. Vascular: No hyperdense vessel or unexpected calcification. Skull: No acute calvarial abnormality. Sinuses/Orbits: Diffuse disease throughout the paranasal sinuses with extensive mucosal thickening. Near complete opacification of the maxillary, frontal and sphenoid sinuses. Mastoid air cells clear. Other: None IMPRESSION: No acute intracranial abnormality. Severe chronic sinusitis. Electronically Signed   By: Rolm Baptise M.D.   On: 05/30/2019 21:40   CT ANGIO NECK W OR WO CONTRAST  Result Date:  05/31/2019 CLINICAL DATA:  Left-sided weakness EXAM: CT ANGIOGRAPHY HEAD AND NECK TECHNIQUE: Multidetector CT imaging of the head and neck was performed using the standard protocol during bolus administration of intravenous contrast. Multiplanar CT image reconstructions and MIPs were obtained to evaluate the vascular anatomy. Carotid stenosis measurements (when applicable) are obtained utilizing NASCET criteria, using the distal internal carotid diameter as the denominator. CONTRAST:  118mL OMNIPAQUE IOHEXOL 350 MG/ML SOLN COMPARISON:  None. FINDINGS: CTA NECK FINDINGS Aortic arch: Great vessel origins are patent. Right carotid system: Common, internal, and external carotid arteries are patent. There is mild calcified plaque along the common carotid. Mild calcified plaque is present at the ICA origin, noting streak artifact through this region. There is no hemodynamically significant stenosis or evidence of dissection. Left carotid system: Common, internal, and external carotid arteries are patent. Mild calcified plaque is present along the common carotid. There is primarily calcified plaque at the bifurcation and ICA origin causing minimal stenosis, noting streak artifact through this region. There is no hemodynamically significant stenosis or evidence of dissection. Vertebral arteries: Dominant right vertebral artery is patent. Calcified plaque at its origin results in mild stenosis. Congenitally small caliber left vertebral artery is patent. Skeleton: Advanced degenerative changes of the cervical spine particularly from C4-C5 through C6-C7. Other neck: No neck mass or adenopathy. Diffuse paranasal sinus opacification with areas of hyperdensity likely reflecting inspissation with fungal colonization not excluded. Upper chest: No apical lung mass. Review of the MIP images confirms the above findings CTA HEAD FINDINGS Anterior circulation: Intracranial internal carotid arteries are patent with calcified plaque along  cavernous and paraclinoid portions causing mild stenosis. Anterior and middle cerebral arteries are patent. Posterior circulation: Intracranial vertebral arteries are patent. The left vertebral artery terminates as a PICA. Basilar artery is patent. Posterior cerebral arteries are patent. Posterior communicating arteries are present. Venous sinuses: Patent as permitted by contrast timing Anatomic variants: Fetal origin of the right PCA. Review of the MIP images confirms the above findings IMPRESSION:  No proximal intracranial vessel occlusion. No occlusion or hemodynamically significant stenosis in the neck. Electronically Signed   By: Macy Mis M.D.   On: 05/31/2019 15:56   CARDIAC CATHETERIZATION  Result Date: 05/30/2019 Findings: RA = 8 RV = 58/12 PA = 67/13 (41) PCW = 24 Fick cardiac output/index = 6.4/4.0 PVR = 2.6 WU Ao sat = 99% PA sat = 67%, 68% High SVC sat = 69% Assessment: 1. Moderately elevated filling pressures with normal cardiac output Plan/Discussion: Continue IV diuresis. Given normal cardiac output would likely not benefit from inotropic support. Glori Bickers, MD 8:49 AM  US Abdomen Limited  Result Date: 06/05/2019 CLINICAL DATA:  Ascites EXAM: LIMITED ABDOMEN ULTRASOUND FOR ASCITES TECHNIQUE: Limited ultrasound survey for ascites was performed in all four abdominal quadrants. COMPARISON:  CT abdomen pelvis 06/01/2019 FINDINGS: Small volume ascites identified in the 4 quadrants. IMPRESSION: Scattered small volume ascites. Electronically Signed   By: Lavonia Dana M.D.   On: 06/05/2019 13:15   DG Chest Portable 1 View  Result Date: 06/18/2019 CLINICAL DATA:  Discharge from the hospital yesterday. Family reports patient is lethargic and not eating. Slow to respond. EXAM: PORTABLE CHEST 1 VIEW COMPARISON:  06/09/2019. FINDINGS: Enlarged cardiopericardial silhouette, stable from the prior study. Stable left anterior chest wall AICD. No mediastinal or hilar masses. Prominent  bronchovascular markings most evident in the bases. No evidence of pneumonia or pulmonary edema. No pleural effusion or pneumothorax. Skeletal structures are grossly intact. IMPRESSION: No acute cardiopulmonary disease. Electronically Signed   By: Lajean Manes M.D.   On: 06/18/2019 14:14   DG Chest Port 1 View  Result Date: 06/09/2019 CLINICAL DATA:  Pt unable to answer hx questions. Only moaned when Tech questioned her. Per nurse: Pt from [facility]. Reports difficulty breathing since 1100 this AM. Lethargic and SOB. 100% on RA, wheezing heard. 5 mg or albuterol given en route. EXAM: PORTABLE CHEST 1 VIEW COMPARISON:  Chest radiograph 06/05/2019 FINDINGS: Stable cardiomediastinal contours with enlarged heart size. Left ICD in place. Central pulmonary vascular congestion. Diffuse mild bilateral interstitial opacities likely reflecting trace edema. No new focal consolidation. No pneumothorax or large pleural effusion. No acute finding in the visualized skeleton. IMPRESSION: Cardiomegaly with central pulmonary vascular congestion and mild bilateral interstitial opacities likely reflecting edema. Electronically Signed   By: Audie Pinto M.D.   On: 06/09/2019 15:59   DG CHEST PORT 1 VIEW  Result Date: 06/05/2019 CLINICAL DATA:  Congestive heart failure. EXAM: PORTABLE CHEST 1 VIEW COMPARISON:  Single-view of the chest 05/31/2019. PA and lateral chest 05/26/2019. FINDINGS: There is cardiomegaly without edema. No consolidative process, pneumothorax or effusion. AICD is in place. IMPRESSION: Cardiomegaly without acute disease. Electronically Signed   By: Inge Rise M.D.   On: 06/05/2019 13:23   DG CHEST PORT 1 VIEW  Result Date: 05/31/2019 CLINICAL DATA:  Cough.  CHF. EXAM: PORTABLE CHEST 1 VIEW COMPARISON:  05/26/2019 FINDINGS: There is a left chest wall ICD with leads in the right atrial appendage and right ventricle. Cardiac enlargement. Aortic atherosclerosis. Pulmonary vascular  congestion. No pleural effusion. No airspace opacities. IMPRESSION: 1. Similar appearance of cardiac enlargement and pulmonary vascular congestion. Electronically Signed   By: Kerby Moors M.D.   On: 05/31/2019 19:42   EEG adult  Result Date: 06/07/2019 Lora Havens, MD     06/07/2019 10:45 AM Patient Name: Stephanie Yates MRN: [id number] Epilepsy Attending: Lora Havens Referring Physician/Provider: Dr Darliss Cheney Date: 06/07/2019 Duration: 22.71mins Patient history: 73yo  M with sudden onset of slurred speech and left sided weakness. EEG to evaluate for seizure Level of alertness: awake AEDs during EEG study: None Technical aspects: This EEG study was done with scalp electrodes positioned according to the 10-20 International system of electrode placement. Electrical activity was acquired at a sampling rate of 500Hz  and reviewed with a high frequency filter of 70Hz  and a low frequency filter of 1Hz . EEG data were recorded continuously and digitally stored. DESCRIPTION: No clear posterior dominant rhythm was seen. EEG showed continuous generalized polymorphic 3-6hz  theta-delta slowing. Sharp transient was seen in right frontotemporal region. Hyperventilation and photic stimulation were not performed.  ABNORMALITY - Continuous slow, generalized  IMPRESSION: This study is suggestive of moderate diffuse encephalopathy, non specific to etiology. No seizures or definite epileptiform discharges were seen throughout the recording. If suspicion for interictal activity remains high, consider long term eeg monitoring. Lora Havens    EEG adult  Result Date: 05/31/2019 Lora Havens, MD     05/31/2019  4:41 PM Patient Name: LINETH THIELKE MRN: [id number] Epilepsy Attending: Lora Havens Referring Physician/Provider: Dr Rosalin Hawking Date: 05/31/2019 Duration: 26.57mins Patient history: 73yo M with sudden onset of slurred speech and left sided weakness. EEG to evaluate for seizure Level of  alertness: awake AEDs during EEG study: none Technical aspects: This EEG study was done with scalp electrodes positioned according to the 10-20 International system of electrode placement. Electrical activity was acquired at a sampling rate of 500Hz  and reviewed with a high frequency filter of 70Hz  and a low frequency filter of 1Hz . EEG data were recorded continuously and digitally stored. DESCRIPTION: No clear posterior dominant rhythm was seen. EEG showed continuous generalized polymorphic 3-6hz  theta-delta slowing. At times, generalized triphasic waves were also noted. Hyperventilation and photic stimulation were not performed. ABNORMALITY - Continuous slow, generalized - Triphasic waves, generalized IMPRESSION: This study is suggestive of moderate diffuse encephalopathy, non specific to etiology, could be secondary to toxic-metabolic causes. No seizures or definite epileptiform discharges were seen throughout the recording. Priyanka Barbra Sarks   CT HEAD CODE STROKE WO CONTRAST`  Result Date: 05/31/2019 CLINICAL DATA:  Code stroke.  Left-sided weakness EXAM: CT HEAD WITHOUT CONTRAST TECHNIQUE: Contiguous axial images were obtained from the base of the skull through the vertex without intravenous contrast. COMPARISON:  CT head 04/15/2019 FINDINGS: Brain: Ventricle size normal. Small hypodensity anterior limb internal capsule on the left unchanged from the prior study. Negative for acute infarct, hemorrhage, mass. No midline shift. Vascular: Negative for hyperdense vessel Skull: Negative Sinuses/Orbits: Extensive mucosal edema throughout the paranasal sinuses. Negative orbit. Other: None ASPECTS ([place]el Stroke Program Early CT Score) - Ganglionic level infarction (caudate, lentiform nuclei, internal capsule, insula, M1-M3 cortex): 7 - Supraganglionic infarction (M4-M6 cortex): 3 Total score (0-10 with 10 being normal): 10 IMPRESSION: 1. No acute abnormality and no change from the prior study 2. ASPECTS is 10  Electronically Signed   By: Franchot Gallo M.D.   On: 05/31/2019 15:03     Time Spent in minutes  30     Desiree Hane M.D on 06/19/2019 at 5:00 PM  To page go to [email or web address] - password [facility]

## 2019-06-20 DIAGNOSIS — D649 Anemia, unspecified: Secondary | ICD-10-CM

## 2019-06-20 DIAGNOSIS — R188 Other ascites: Secondary | ICD-10-CM

## 2019-06-20 DIAGNOSIS — Z7189 Other specified counseling: Secondary | ICD-10-CM

## 2019-06-20 DIAGNOSIS — K7460 Unspecified cirrhosis of liver: Secondary | ICD-10-CM

## 2019-06-20 LAB — CBC
HCT: 21.8 % — ABNORMAL LOW (ref 36.0–46.0)
Hemoglobin: 7.3 g/dL — ABNORMAL LOW (ref 12.0–15.0)
MCH: 25.6 pg — ABNORMAL LOW (ref 26.0–34.0)
MCHC: 33.5 g/dL (ref 30.0–36.0)
MCV: 76.5 fL — ABNORMAL LOW (ref 80.0–100.0)
Platelets: 208 10*3/uL (ref 150–400)
RBC: 2.85 MIL/uL — ABNORMAL LOW (ref 3.87–5.11)
RDW: 18 % — ABNORMAL HIGH (ref 11.5–15.5)
WBC: 12 10*3/uL — ABNORMAL HIGH (ref 4.0–10.5)
nRBC: 0 % (ref 0.0–0.2)

## 2019-06-20 LAB — BASIC METABOLIC PANEL
Anion gap: 11 (ref 5–15)
BUN: 40 mg/dL — ABNORMAL HIGH (ref 8–23)
CO2: 26 mmol/L (ref 22–32)
Calcium: 8.1 mg/dL — ABNORMAL LOW (ref 8.9–10.3)
Chloride: 101 mmol/L (ref 98–111)
Creatinine, Ser: 2.52 mg/dL — ABNORMAL HIGH (ref 0.44–1.00)
GFR calc Af Amer: 21 mL/min — ABNORMAL LOW (ref >60)
GFR calc non Af Amer: 18 mL/min — ABNORMAL LOW (ref >60)
Glucose, Bld: 90 mg/dL (ref 70–99)
Potassium: 3.5 mmol/L (ref 3.5–5.1)
Sodium: 138 mmol/L (ref 135–145)

## 2019-06-20 LAB — PREPARE RBC (CROSSMATCH)

## 2019-06-20 MED ORDER — SODIUM CHLORIDE 0.9% IV SOLUTION: Freq: Once | INTRAVENOUS | Status: AC

## 2019-06-20 MED ORDER — LACTULOSE 10 GM/15ML PO SOLN
20.0000 g | Freq: Three times a day (TID) | ORAL | Status: DC
  Administered 2019-06-20 – 2019-06-21 (×5): 20 g via ORAL
  Filled 2019-06-20 (×6): qty 30

## 2019-06-20 MED ORDER — PNEUMOCOCCAL VAC POLYVALENT 25 MCG/0.5ML IJ INJ: 0.5000 mL | INJECTION | INTRAMUSCULAR | Status: DC

## 2019-06-20 MED ORDER — TORSEMIDE 20 MG PO TABS
20.0000 mg | ORAL_TABLET | Freq: Every day | ORAL | Status: DC
  Administered 2019-06-20: 20 mg via ORAL
  Filled 2019-06-20: qty 1

## 2019-06-20 NOTE — Progress Notes (Addendum)
Physical Therapy Treatment Patient Details Name: Stephanie Yates MRN: [id number] DOB: 26-Dec-1945 Today's Date: 06/20/2019    History of Present Illness 73yo female very well known to PT due to multiple recent admits secondary to acute on chronic CHF as well as possible CVA that was unable to be confirmed on MRI due to PPM. Discharged home 12/21, now returns to ED due to dizziness, found to be hypotensive and hypovolemic in ED. PMH CHF with EJ 10-15%, CKD, HLD, HTN, mitral regurg, cardiomyopathy, A-fib, VT s/p AICD/PPM, cardiac cath, GIB    PT Comments    Noted HCT to be 21.8 this morning, discussed with MD who cleared her for therapy as long as she is not actively bleeding. Upon entry to room, patient with significant amount of malaise and requesting back to bed, RN states he recently noted bright red blood per rectum. Deferred gait or progression of mobility due to low HCT/active bleed, did assist patient back to bed with MinAx2 for safety, ModA for bed mobility. Noted bright red blood on chuck pad, MD aware. She was left in bed with all needs met, bed alarm active. Will attempt to progress mobility next session if medical status has improved.     Follow Up Recommendations  SNF;Supervision/Assistance - 24 hour;Other (comment)(if patient/family refuse SNF, will require HHPT and 24/7A)     Equipment Recommendations  Rolling walker with 5" wheels;3in1 (PT);Wheelchair (measurements PT);Wheelchair cushion (measurements PT)    Recommendations for Other Services       Precautions / Restrictions Precautions Precautions: Fall Precaution Comments: grossly weak, fatigues quickly, ejection fraction 10-15% Restrictions Weight Bearing Restrictions: No    Mobility  Bed Mobility Overal bed mobility: Needs Assistance Bed Mobility: Sit to Supine       Sit to supine: Mod assist   General bed mobility comments: ModA for sit to supine, then totalAx2 for repositioning in bed  Transfers Overall  transfer level: Needs assistance Equipment used: Rolling walker (2 wheeled) Transfers: Sit to/from [facility] Sit to Stand: Min assist;+2 physical assistance Stand pivot transfers: Min assist;+2 physical assistance       General transfer comment: MinA+2 for safety and line management, fatigues quickly  Ambulation/Gait             General Gait Details: deferred- HCT 21.8 and actively bleeding   Stairs             Wheelchair Mobility    Modified Rankin (Stroke Patients Only)       Balance Overall balance assessment: Needs assistance Sitting-balance support: Bilateral upper extremity supported;Feet supported Sitting balance-Leahy Scale: Good Sitting balance - Comments: S for sitting at EOB   Standing balance support: Bilateral upper extremity supported;During functional activity Standing balance-Leahy Scale: Poor Standing balance comment: reliant on external support                            Cognition Arousal/Alertness: Awake/alert Behavior During Therapy: Anxious Overall Cognitive Status: Impaired/Different from baseline Area of Impairment: Attention;Following commands;Safety/judgement;Awareness;Memory;Problem solving                 Orientation Level: Disoriented to;Situation Current Attention Level: Sustained Memory: Decreased short-term memory Following Commands: Follows one step commands consistently;Follows one step commands with increased time Safety/Judgement: Decreased awareness of deficits;Decreased awareness of safety Awareness: Emergent Problem Solving: Slow processing;Decreased initiation;Difficulty sequencing;Requires verbal cues;Requires tactile cues General Comments: anxious today but able to follow commands iwth extended time  Exercises      General Comments General comments (skin integrity, edema, etc.): VSS on room air      Pertinent Vitals/Pain Pain Assessment: No/denies pain Pain Score:  0-No pain Pain Intervention(s): Limited activity within patient's tolerance;Monitored during session    Home Living                      Prior Function            PT Goals (current goals can now be found in the care plan section) Acute Rehab PT Goals Patient Stated Goal: go home PT Goal Formulation: With patient Time For Goal Achievement: 07/26/2019 Potential to Achieve Goals: Fair Progress towards PT goals: Not progressing toward goals - comment(limited by medical status today)    Frequency    Min 3X/week      PT Plan      Co-evaluation              AM-PAC PT "6 Clicks" Mobility   Outcome Measure  Help needed turning from your back to your side while in a flat bed without using bedrails?: A Little Help needed moving from lying on your back to sitting on the side of a flat bed without using bedrails?: A Little Help needed moving to and from a bed to a chair (including a wheelchair)?: A Little Help needed standing up from a chair using your arms (e.g., wheelchair or bedside chair)?: A Lot Help needed to walk in hospital room?: A Lot Help needed climbing 3-5 steps with a railing? : Total 6 Click Score: 14    End of Session Equipment Utilized During Treatment: Gait belt Activity Tolerance: Treatment limited secondary to medical complications (Comment) Patient left: in bed;with call bell/phone within reach;with bed alarm set Nurse Communication: Other (comment) PT Visit Diagnosis: Muscle weakness (generalized) (M62.81);Difficulty in walking, not elsewhere classified (R26.2);Other symptoms and signs involving the nervous system (R29.898) Hemiplegia - Right/Left: Left Hemiplegia - dominant/non-dominant: Non-dominant Hemiplegia - caused by: Unspecified     Time: 1032-1050 PT Time Calculation (min) (ACUTE ONLY): 18 min  Charges:  $Therapeutic Activity: 8-22 mins                     Windell Norfolk, DPT, PN1   Supplemental Physical Therapist [place]     Pager [phone] Acute Rehab Office [phone]

## 2019-06-20 NOTE — Progress Notes (Signed)
[facility]  PROGRESS NOTE  [name] GHW:[id number] DOB: Oct 26, 1945 DOA: 06/18/2019 PCP: Josetta Huddle, MD Admit date - 06/18/2019   Admitting Physician Desiree Hane, MD  Outpatient Primary MD for the patient is Josetta Huddle, MD  LOS - 1 Brief Narrative   Stephanie Yates is a 73 y.o. year old female with medical history significant for CHF/NICM (EF 10 to 15%), VT s/p [id number] and biventricular PPM, chronic anemia, CKD-4, HTN, HLD, moderate protein calorie malnutrition and GIB, prolonged QT/torsade with recent hospitalization from 12/13-12/21 who presented on 06/18/2019 with lethargy, decreased eating, decreased strength, and increased confusion and was found to have for CHF exacerbation and hepatic encephalopathy requiring lactulose.   Subjective  Stephanie Yates today feels head is s swimming.  Nurse noted some bright red blood on the bed pad and reported nausea Currently patient denies any abdominal pain, chest pain or shortness of breath. She is thirsty and hungry A & P    Lethargy, suspect related to chronic comorbidities in the setting of advanced heart failure.  Ammonia mildly elevated at 50, this is likely contributing in addition to chronic comorbidities most notable for advanced end-stage CHF, cirrhosis, CKD.  TSH within normal limits.. -Check TSH -Address multiple comorbidities below  Goals of care discussion Palliative services as outpatient Discussed with patient's daughter and son over the phone on 12/24 about their mothers guarded prognosis given advanced CHF, worsening CKD, persistent anemia on blood thinners with decreased options for GI intervention, and overall deconditioning.  Family understands that mom is likely in end-of-life process. -Family would like to monitor for additional 24 to 48 hours with full scope of medical treatment -Reassess, to consider comfort care measures -Palliative care consulted to assist with goals of care discussions as  well -Reconsult palliative care to continue goals of care discussion given recurrent hospitalization  Hypotension, persistent.   Persists despite holding diuretics and gentle IVF. Could be decreased contractility related to CHF. Orthostatics negative. Has had some bright red blood but no overt bleeding -Continue to hold home diuretics -gentle diuresis with IVF -Check BNP -Right upper quadrant ultrasound for evaluation of ascites -We change from observation status and admit as inpatient given above  AKI on CKD Stage 4, likely pre-renal from decreased PO intake complicated by hypotension presumed related to diuretics but no improvement with IVF or holding diuretics. Could be related to CHF given elevated BNP and increase in weight. Creatinine stable at 2.5.   Baseline creatinine 1.7-2-Continue to hold home diuretics -d/c ivf --resume reduce dose of home torsemide with BP parameters -No ACE/ARB/ARN I CKD stage IV -Not a candidate for advanced therapies previous heart failure evaluation by cardiology --family agreeable to no escalation in current scope of treatment  CHFrEF, NYAHA IV, . Advanced end stage CHF, not a candidate for advanced CHF therapies on last cardiology evaluation while inpatient on 12/18.   BNP 1000, up 7 lbs -resume home torsemide PO at reduced dose of 20 mg to see if improved with hold parameters for BP --family does not want to escalate current medical treatment so will not consult cardiology -Daily weights, strict I's and O's, monitor output -Continue Coreg 3.125 twice daily  Mild troponin elevation  Troponin of 36--59.  Nonischemic EKG  Cirrhosis with small volume ascites Currently has abdominal distention, likely from CHF. Abd xr with small volume ascites. Not enough for paracentesis.    Ammonia slightly up at 50. Alert and oriented to self and place currently -Continue lactulose, increase 20  g TID to have 2-3 BM daily -closely monitor  GERD, stable -continue  Protonix  Paroxysmal atrial fibrillation, currently rate controlled History of ventricular tachycardia status post AICD in 2014 a biventricular pacemaker.   No arrhythmias on review of telemetry -Continue amiodarone, mexiletine  --holding  Eliquis -monitor on telemetry  Hyperlipidemia, stable -Continue atorvastatin  Chronic microcytic anemia, slight down trend.  Consistent with iron deficiency anemia based on most recent iron panel. No current melena, no BRBPR.  History of multiple endoscopies to evaluate anemia/melena recently evaluation by GI (11/30) during last hospitalization did not recommend endoscopic evaluation at the time given no overt bleeding Hemoglobin stable at baseline 7-8 but had some reported blood streaks on pad. On DRE (12/24) no melena or blood seen --1 U PRBC, goal hgb > 8 given cardiac disease -Check CBC daily -Continue oral iron --protonix 40 mg po qd --discontinued eliquis  Moderate protein calorie malnutrition   Debility PT recommends SNF Daughter is agreeable if patient becomes agreeable Patient reports bad experience with [facility] in the past    Family Communication  : Spoke with daughter Stephanie Yates and her son at [phone] on 12/24 discussed with her mom's heart is very weak with end stage CHF, as well as worsening kidney function, liver disease,  and has a guarded prognosis given her hypotension and kidney dysfunction. They would like to see if any improvement without escalation of care for next 24-48 hours before considering comfort care.   Code Status : DNR  Disposition Plan  : guarded prognosis, end stage CHF will trial low dose torsemide, monitor BP(hypotensive), kidney function, mental status.   Consults  : Palliative  Procedures  : None  DVT Prophylaxis  : SCDs, eliquis d/c'd due to bleeding  Lab Results  Component Value Date   PLT 208 06/20/2019    Diet :  Diet Order            Diet Heart Room service appropriate?  Yes; Fluid consistency: Thin  Diet effective now               Inpatient Medications Scheduled Meds: . sodium chloride   Intravenous Once  . amiodarone  200 mg Oral BID  . atorvastatin  40 mg Oral q1800  . carvedilol  3.125 mg Oral BID WC  . ferrous gluconate  324 mg Oral BID  . lactulose  20 g Oral BID  . magnesium oxide  400 mg Oral q morning - 10a  . mexiletine  200 mg Oral BID  . pantoprazole  40 mg Oral Daily   Continuous Infusions: . sodium chloride 75 mL/hr at 06/20/19 0630   PRN Meds:.acetaminophen  Antibiotics  :   Anti-infectives (From admission, onward)   None       Objective   Vitals:   06/19/19 1552 06/19/19 1854 06/19/19 2113 06/20/19 0422  BP:  (!) 115/55 (!) 119/57 (!) 104/50  Pulse:   60 (!) 59  Resp: 20   20  Temp: 98.1 F (36.7 C)  (!) 97.4 F (36.3 C) 97.6 F (36.4 C)  TempSrc: Oral  Oral Oral  SpO2:   93% 92%  Weight:    59.7 kg  Height:        SpO2: 92 %  Wt Readings from Last 3 Encounters:  06/20/19 59.7 kg  06/17/19 57.3 kg  06/07/19 61.9 kg     Intake/Output Summary (Last 24 hours) at 06/20/2019 1208 Last data filed at 06/20/2019 0707 Gross per 24 hour  Intake 1200 ml  Output 400 ml  Net 800 ml    Physical Exam:  Awake Alert, oriented to self, place Slow speech but follows all commands with no appreciable deficits [place].AT, No appreciable JVD Symmetrical Chest wall movement, Good air movement bilaterally on room air RRR,No Gallops,Rubs or new Murmurs,  +ve B.Sounds, Abd Soft and distended with  no tenderness, No rebound, guarding or rigidity. No peripheral edema No hemorrhoids, anal fissure or other lesions visualized on rectal exam. On DRE no melena or hematochezia   I have personally reviewed the following:   Data Reviewed:  CBC Recent Labs  Lab 06/15/19 0433 06/16/19 0401 06/18/19 1300 06/19/19 0541 06/20/19 0405  WBC 15.6* 15.7* 16.3* 16.8* 12.0*  HGB 8.2* 7.7* 8.5* 7.8* 7.3*  HCT 25.1* 23.5* 25.9*  23.9* 21.8*  PLT 229 259 246 248 208  MCV 77.5* 76.3* 77.1* 76.8* 76.5*  MCH 25.3* 25.0* 25.3* 25.1* 25.6*  MCHC 32.7 32.8 32.8 32.6 33.5  RDW 17.1* 17.5* 17.6* 17.8* 18.0*  LYMPHSABS  --  1.8 1.7  --   --   MONOABS  --  1.4* 1.5*  --   --   EOSABS  --  0.2 0.2  --   --   BASOSABS  --  0.1 0.1  --   --     Chemistries  Recent Labs  Lab 06/16/19 0401 06/16/19 1436 06/17/19 0607 06/18/19 1300 06/19/19 0541 06/20/19 0405  NA 140  --  138 135 138 138  K 3.8  --  4.0 4.0 3.9 3.5  CL 103  --  102 99 103 101  CO2 27  --  27 26 24 26   GLUCOSE 102*  --  82 112* 78 90  BUN 28*  --  31* 37* 37* 40*  CREATININE 1.96*  --  1.87* 2.56* 2.48* 2.52*  CALCIUM 8.1*  --  8.1* 8.3* 8.2* 8.1*  MG  --  2.0  --   --   --   --   AST 63*  --   --  69* 60*  --   ALT 30  --   --  29 28  --   ALKPHOS 112  --   --  108 104  --   BILITOT 0.9  --   --  1.6* 1.6*  --    ------------------------------------------------------------------------------------------------------------------ No results for input(s): CHOL, HDL, LDLCALC, TRIG, CHOLHDL, LDLDIRECT in the last 72 hours.  Lab Results  Component Value Date   HGBA1C 5.6 05/31/2019   ------------------------------------------------------------------------------------------------------------------ Recent Labs    06/19/19 1904  TSH 3.669   ------------------------------------------------------------------------------------------------------------------ No results for input(s): VITAMINB12, FOLATE, FERRITIN, TIBC, IRON, RETICCTPCT in the last 72 hours.  Coagulation profile No results for input(s): INR, PROTIME in the last 168 hours.  No results for input(s): DDIMER in the last 72 hours.  Cardiac Enzymes No results for input(s): CKMB, TROPONINI, MYOGLOBIN in the last 168 hours.  Invalid input(s): CK ------------------------------------------------------------------------------------------------------------------    Component Value Date/Time    BNP 1,324.0 (H) 06/19/2019 1634   BNP 367.0 (H) 03/16/2016 1457    Micro Results Recent Results (from the past 240 hour(s))  SARS CORONAVIRUS 2 (TAT 6-24 HRS) Nasopharyngeal Nasopharyngeal Swab     Status: None   Collection Time: 06/18/19  5:58 PM   Specimen: Nasopharyngeal Swab  Result Value Ref Range Status   SARS Coronavirus 2 NEGATIVE NEGATIVE Final    Comment: (NOTE) SARS-CoV-2 target nucleic acids are NOT DETECTED. The SARS-CoV-2 RNA is generally  detectable in upper and lower respiratory specimens during the acute phase of infection. Negative results do not preclude SARS-CoV-2 infection, do not rule out co-infections with other pathogens, and should not be used as the sole basis for treatment or other patient management decisions. Negative results must be combined with clinical observations, patient history, and epidemiological information. The expected result is Negative. Fact Sheet for Patients: SugarRoll.be Fact Sheet for Healthcare Providers: https://www.woods-mathews.com/ This test is not yet approved or cleared by the [place] FDA and  has been authorized for detection and/or diagnosis of SARS-CoV-2 by FDA under an Emergency Use Authorization (EUA). This EUA will remain  in effect (meaning this test can be used) for the duration of the COVID-19 declaration under Section 56 4(b)(1) of the Act, 21 U.S.C. section 360bbb-3(b)(1), unless the authorization is terminated or revoked sooner. Performed at [facility], [place] [address] [place], [place] [address]     Radiology Reports CT ABDOMEN PELVIS WO CONTRAST  Result Date: 06/14/2019 CLINICAL DATA:  Acute generalized abdominal pain. EXAM: CT ABDOMEN AND PELVIS WITHOUT CONTRAST TECHNIQUE: Multidetector CT imaging of the abdomen and pelvis was performed following the standard protocol without IV contrast. COMPARISON:  06/01/2019 FINDINGS: Lower chest: Mild bibasilar  atelectasis. Hepatobiliary: Findings suspicious for hepatic cirrhosis are again demonstrated. No liver masses identified on this unenhanced exam. Gallbladder is unremarkable. No evidence of biliary ductal dilatation. Pancreas: No mass or inflammatory process visualized on this unenhanced exam. Spleen:  Within normal limits in size. Adrenals/Urinary tract: Stable 3.5 cm low-attenuation right adrenal mass, consistent with benign adenoma. A few tiny less than 5 mm renal calculi are again seen bilaterally, however there is no evidence of ureteral calculi or hydronephrosis. Unremarkable unopacified urinary bladder. Stomach/Bowel: No evidence of obstruction, inflammatory process, or abnormal fluid collections. Diverticulosis is seen mainly involving the descending and sigmoid colon, however there is no evidence of diverticulitis. Vascular/Lymphatic: No pathologically enlarged lymph nodes identified. No evidence of abdominal aortic aneurysm. Aortic atherosclerosis incidentally noted. Reproductive: Small uterine fibroids some of which are calcified are again noted. Adnexal regions are unremarkable. Other: Diffuse mesenteric and body wall edema, and mild ascites, have mildly increased since previous study. Musculoskeletal:  No suspicious bone lesions identified. IMPRESSION: Mild increase in mild ascites and diffuse mesenteric and body wall edema since prior study. No focal inflammatory process or abscess identified. Probable hepatic cirrhosis. Colonic diverticulosis, without radiographic evidence of diverticulitis. Bilateral nephrolithiasis. No evidence of ureteral calculi or hydronephrosis. Stable small uterine fibroids. Stable benign right adrenal adenoma. Aortic Atherosclerosis (ICD10-I70.0). Electronically Signed   By: Marlaine Hind M.D.   On: 06/14/2019 18:21   CT ABDOMEN PELVIS WO CONTRAST  Result Date: 06/01/2019 CLINICAL DATA:  Abdominal pain.  Concern for diverticulitis. EXAM: CT ABDOMEN AND PELVIS WITHOUT  CONTRAST TECHNIQUE: Multidetector CT imaging of the abdomen and pelvis was performed following the standard protocol without IV contrast. COMPARISON:  05/26/2019 FINDINGS: The lack of intravenous contrast limits the ability to evaluate solid abdominal organs. Lower chest: Limited visualization of lower thorax demonstrates minimal bibasilar heterogeneous opacities, left greater than right, improved compared to the 11/29 examination. No pleural effusion. Cardiomegaly. Pacer leads again terminate within the right atrium and ventricle. No pericardial effusion. Hepatobiliary: Nodularity hepatic contour. There is diffuse decreased attempt increased attenuation of the hepatic parenchyma as could be seen in the setting of chronic amiodarone therapy. Trace amount of perihepatic ascites, unchanged to slightly progressed compared to the 05/26/2019 examination. Normal noncontrast appearance of the gallbladder. No radiopaque  gallstones. Pancreas: Normal noncontrast appearance of the pancreas. Spleen: Normal noncontrast appearance of the spleen. Adrenals/Urinary Tract: The bilateral kidneys again appear atrophic. Punctate nonobstructing renal stones are again seen bilaterally with dominant nonobstructing stone within the inferior pole the right kidney measuring approximately 4 mm in diameter (image 38, series 3) and dominant nonobstructing stone within the inferior pole the left kidney measuring approximately 2 mm (image 36, series 3). No renal stones are seen along the expected course of either ureter. Excreted contrast from previous contrast enhance head and neck CTA is seen within the urinary bladder. No urinary obstruction or perinephric stranding. Redemonstrated approximately 3.8 x 1.9 cm hypoattenuating right-sided adrenal adenoma. Normal noncontrast appearance of the left adrenal gland. Stomach/Bowel: Ingested enteric contrast extends to the level of the hepatic flexure of the colon. Redemonstrated small right-sided  inguinal hernia which is noted to contain a short-segment of nondilated small bowel (representative coronal images 39 through 23, series 6), not resulting in enteric obstruction. Moderate colonic stool burden. Scattered colonic diverticulosis without evidence of superimposed acute diverticulitis. Normal appearance of the terminal ileum. The appendix is not visualized, however there is no definitive pericecal inflammatory change on this noncontrast examination. No pneumoperitoneum, pneumatosis or portal venous gas. Vascular/Lymphatic: Atherosclerotic plaque within normal caliber abdominal aorta. No bulky retroperitoneal, mesenteric, pelvic or inguinal lymphadenopathy. Reproductive: Dystrophic calcifications, presumed degenerating fibroids within and expectedly atrophic uterus. No discrete adnexal lesion. Trace amount of fluid in the pelvic cul-de-sac. Other: Moderate amount of diffuse body wall anasarca potentially secondary to third spacing. Musculoskeletal: No acute or aggressive osseous abnormalities. Grade 1 anterolisthesis of L4 upon L5 and L5 upon S1 without associated pars defects. A bone island is noted within the left acetabulum. IMPRESSION: 1. No explanation for patient's abdominal pain. Specifically, no evidence enteric obstruction or diverticulitis on this noncontrast examination. 2. Redemonstrated right inguinal hernia containing nondilated loops of small bowel and not resulting in enteric obstruction. 3. Nonobstructive bilateral nephrolithiasis unchanged. 4. Cardiomegaly with diffuse body wall anasarca as could be seen in the setting early CHF. Clinical correlation is advised. 5. Suspected early cirrhotic change with minimal amount perihepatic ascites. 6.  Aortic Atherosclerosis (ICD10-I70.0). Electronically Signed   By: Sandi Mariscal M.D.   On: 06/01/2019 12:08   CT ABDOMEN PELVIS WO CONTRAST  Result Date: 05/26/2019 CLINICAL DATA:  73 year old female with history of abdominal distension. Diffuse  abdominal pain, most severe on the right side. EXAM: CT ABDOMEN AND PELVIS WITHOUT CONTRAST TECHNIQUE: Multidetector CT imaging of the abdomen and pelvis was performed following the standard protocol without IV contrast. COMPARISON:  CT the abdomen and pelvis 03/17/2019. FINDINGS: Lower chest: Mild scarring in the visualized lung bases. Cardiomegaly. Atherosclerotic calcifications in the left anterior descending, left circumflex and right coronary arteries. Pacemaker leads terminating in the right atrium and right ventricular apex. Hepatobiliary: Liver has a slightly shrunken appearance and nodular contour, suggesting underlying cirrhosis. No definite suspicious cystic or solid hepatic lesions are confidently identified on today's noncontrast CT examination. Unenhanced appearance of the gallbladder is normal. Pancreas: No definite pancreatic mass or peripancreatic fluid collections or inflammatory changes are noted on today's noncontrast CT examination. Spleen: Unremarkable. Adrenals/Urinary Tract: Multiple nonobstructive calculi are noted within the collecting systems of both kidneys, largest of which measures up to 4 mm in the lower pole collecting system of the right kidney. No additional calculi are confidently identified along the course of either ureter or within the lumen of the urinary bladder. No hydroureteronephrosis. Unenhanced appearance of the kidneys is  otherwise normal. Left adrenal gland is normal in appearance. 4.5 x 2.1 cm low-attenuation (-27 HU) right adrenal nodule, similar to prior studies, compatible with an adrenal myelolipoma. Unenhanced appearance of the urinary bladder is normal. Stomach/Bowel: Unenhanced appearance of the stomach is normal. No pathologic dilatation of small bowel or colon. Multiple loops of small bowel appear to extend into a large right inguinal hernia. The appendix is not confidently identified and may be surgically absent. Regardless, there are no inflammatory changes  noted adjacent to the cecum to suggest the presence of an acute appendicitis at this time. Numerous colonic diverticulae are noted, without surrounding inflammatory changes to suggest an acute diverticulitis at this time. Vascular/Lymphatic: Aortic atherosclerosis. No lymphadenopathy noted in the abdomen or pelvis confidently identified on today's noncontrast CT examination. Reproductive: Uterus is markedly heterogeneous in appearance with multiple coarse calcifications, compatible with a fibroid uterus. Ovaries are unremarkable in appearance. Other: Trace volume of ascites.  No pneumoperitoneum. Musculoskeletal: There are no aggressive appearing lytic or blastic lesions noted in the visualized portions of the skeleton. IMPRESSION: 1. Large right inguinal hernia containing several loops of small bowel, without evidence of bowel incarceration or obstruction. 2. Numerous small nonobstructive calculi are noted in the collecting systems of both kidneys measuring up to 4 mm in the lower pole. No ureteral stones or findings of urinary tract obstruction are noted at this time. 3. Colonic diverticulosis without evidence of acute diverticulitis at this time. 4. Severe cardiomegaly. 5. Aortic atherosclerosis, in addition to least 3 vessel coronary artery disease. Assessment for potential risk factor modification, dietary therapy or pharmacologic therapy may be warranted, if clinically indicated. 6. Morphologic changes in the liver suggestive of underlying cirrhosis. 7. Additional incidental findings, as above. Electronically Signed   By: Vinnie Langton M.D.   On: 05/26/2019 18:27   CT ANGIO HEAD W OR WO CONTRAST  Result Date: 05/31/2019 CLINICAL DATA:  Left-sided weakness EXAM: CT ANGIOGRAPHY HEAD AND NECK TECHNIQUE: Multidetector CT imaging of the head and neck was performed using the standard protocol during bolus administration of intravenous contrast. Multiplanar CT image reconstructions and MIPs were obtained to  evaluate the vascular anatomy. Carotid stenosis measurements (when applicable) are obtained utilizing NASCET criteria, using the distal internal carotid diameter as the denominator. CONTRAST:  173mL OMNIPAQUE IOHEXOL 350 MG/ML SOLN COMPARISON:  None. FINDINGS: CTA NECK FINDINGS Aortic arch: Great vessel origins are patent. Right carotid system: Common, internal, and external carotid arteries are patent. There is mild calcified plaque along the common carotid. Mild calcified plaque is present at the ICA origin, noting streak artifact through this region. There is no hemodynamically significant stenosis or evidence of dissection. Left carotid system: Common, internal, and external carotid arteries are patent. Mild calcified plaque is present along the common carotid. There is primarily calcified plaque at the bifurcation and ICA origin causing minimal stenosis, noting streak artifact through this region. There is no hemodynamically significant stenosis or evidence of dissection. Vertebral arteries: Dominant right vertebral artery is patent. Calcified plaque at its origin results in mild stenosis. Congenitally small caliber left vertebral artery is patent. Skeleton: Advanced degenerative changes of the cervical spine particularly from C4-C5 through C6-C7. Other neck: No neck mass or adenopathy. Diffuse paranasal sinus opacification with areas of hyperdensity likely reflecting inspissation with fungal colonization not excluded. Upper chest: No apical lung mass. Review of the MIP images confirms the above findings CTA HEAD FINDINGS Anterior circulation: Intracranial internal carotid arteries are patent with calcified plaque along cavernous and paraclinoid  portions causing mild stenosis. Anterior and middle cerebral arteries are patent. Posterior circulation: Intracranial vertebral arteries are patent. The left vertebral artery terminates as a PICA. Basilar artery is patent. Posterior cerebral arteries are patent.  Posterior communicating arteries are present. Venous sinuses: Patent as permitted by contrast timing Anatomic variants: Fetal origin of the right PCA. Review of the MIP images confirms the above findings IMPRESSION: No proximal intracranial vessel occlusion. No occlusion or hemodynamically significant stenosis in the neck. Electronically Signed   By: Macy Mis M.D.   On: 05/31/2019 15:56   DG Chest 2 View  Result Date: 05/26/2019 CLINICAL DATA:  Patient with right-sided abdominal pain. History of CHF. EXAM: CHEST - 2 VIEW COMPARISON:  Chest radiograph 02/20/2019 FINDINGS: Multi lead AICD device overlies the left hemithorax. Leads are stable in position. Stable cardiomegaly. Tortuosity of the thoracic aorta. Heterogeneous opacities left lung base. Pulmonary vascular redistribution. Trace bilateral pleural effusions. Thoracic spine degenerative changes. IMPRESSION: Cardiomegaly with pulmonary vascular redistribution and mild interstitial edema. Heterogeneous opacities left lung base may represent atelectasis. Probable trace bilateral pleural effusions. Electronically Signed   By: Lovey Newcomer M.D.   On: 05/26/2019 20:01   DG Chest 2 View  Result Date: 05/23/2019 CLINICAL DATA:  Shortness of breath EXAM: CHEST - 2 VIEW COMPARISON:  Radiograph 05/05/2019 FINDINGS: Cardiomegaly is similar to slightly increased from comparison AP radiography. Pacer/defibrillator pack overlies the left chest wall leads in the cardiac apex and right atrium. There is a mildly increased interstitial opacity with fissural and septal thickening and indistinct pulmonary vascularity. No consolidative opacity. Atelectatic changes in the lung bases. No visible pneumothorax or effusion. The osseous structures appear diffusely demineralized which may limit detection of small or nondisplaced fractures. No acute osseous or soft tissue abnormality. Degenerative changes are present in the imaged spine and shoulders. IMPRESSION: 1.  Cardiomegaly with mild interstitial pulmonary edema, similar to slightly increased from prior study. 2. Bibasilar atelectasis. No consolidation. Electronically Signed   By: Lovena Le M.D.   On: 05/23/2019 01:17   CT Head Wo Contrast  Result Date: 06/18/2019 CLINICAL DATA:  Lethargic and not eating EXAM: CT HEAD WITHOUT CONTRAST TECHNIQUE: Contiguous axial images were obtained from the base of the skull through the vertex without intravenous contrast. COMPARISON:  06/04/2019 FINDINGS: Brain: There is no acute intracranial hemorrhage, mass-effect, or edema. Gray-white differentiation is preserved. There is no extra-axial fluid collection. Ventricles and sulci are within normal limits in size and configuration. Patchy hypoattenuation in the supratentorial white matter is nonspecific but likely reflects stable chronic microvascular ischemic changes. Vascular: There is atherosclerotic calcification at the skull base. Skull: Calvarium is unremarkable. Sinuses/Orbits: Persistent paranasal sinus opacification with areas of high density likely reflecting inspissation. Orbits are unremarkable. Other: None. IMPRESSION: No acute intracranial abnormality. Stable chronic findings detailed above. Electronically Signed   By: Macy Mis M.D.   On: 06/18/2019 13:45   CT HEAD WO CONTRAST  Result Date: 06/04/2019 CLINICAL DATA:  Left-sided weakness EXAM: CT HEAD WITHOUT CONTRAST TECHNIQUE: Contiguous axial images were obtained from the base of the skull through the vertex without intravenous contrast. COMPARISON:  06/01/2019 FINDINGS: Brain: There is no acute intracranial hemorrhage, mass-effect, or edema. There is no new loss of gray-white differentiation. Patchy hypoattenuation in the supratentorial white matter is nonspecific but likely reflects stable chronic microvascular ischemic changes. There is no extra-axial fluid collection. Ventricles and sulci are stable in size and configuration. Vascular: There is  atherosclerotic calcification at the skull base. Skull: Calvarium is  unremarkable. Sinuses/Orbits: Persistent diffuse polypoid mucosal thickening with areas of increased density likely reflecting inspissation. Other: None. IMPRESSION: No significant change since 06/01/2019. Electronically Signed   By: Macy Mis M.D.   On: 06/04/2019 14:21   CT HEAD WO CONTRAST  Result Date: 06/01/2019 CLINICAL DATA:  Encephalopathy. Follow-up for question of left basal ganglia region stroke. EXAM: CT HEAD WITHOUT CONTRAST TECHNIQUE: Contiguous axial images were obtained from the base of the skull through the vertex without intravenous contrast. COMPARISON:  CT 05/31/2019, 05/30/2019 and 04/15/2019. FINDINGS: Brain: No acute finding. The brainstem and cerebellum appear normal. Cerebral hemispheres show mild chronic small-vessel ischemic change of the white matter. This includes an area of low-density in the anterior limb internal capsule on the left which was present in October in therefore not acute. No sign of acute infarction, mass lesion, hemorrhage, hydrocephalus or extra-axial collection. Vascular: There is atherosclerotic calcification of the major vessels at the base of the brain. Skull: Negative Sinuses/Orbits: Widespread sinus opacification consistent with rhinosinusitis. Orbits negative. Other: None IMPRESSION: No acute intracranial finding. Chronic small-vessel ischemic changes of the white matter. Low-density in the anterior limb internal capsule on the left question as acute on the recent study was, I think, present on 04/15/2019 and therefore not acute. Widespread rhinosinusitis. Electronically Signed   By: Nelson Chimes M.D.   On: 06/01/2019 11:12   CT HEAD WO CONTRAST  Result Date: 05/30/2019 CLINICAL DATA:  Vertigo EXAM: CT HEAD WITHOUT CONTRAST TECHNIQUE: Contiguous axial images were obtained from the base of the skull through the vertex without intravenous contrast. COMPARISON:  04/15/2019 FINDINGS:  Brain: No acute intracranial abnormality. Specifically, no hemorrhage, hydrocephalus, mass lesion, acute infarction, or significant intracranial injury. Vascular: No hyperdense vessel or unexpected calcification. Skull: No acute calvarial abnormality. Sinuses/Orbits: Diffuse disease throughout the paranasal sinuses with extensive mucosal thickening. Near complete opacification of the maxillary, frontal and sphenoid sinuses. Mastoid air cells clear. Other: None IMPRESSION: No acute intracranial abnormality. Severe chronic sinusitis. Electronically Signed   By: Rolm Baptise M.D.   On: 05/30/2019 21:40   CT ANGIO NECK W OR WO CONTRAST  Result Date: 05/31/2019 CLINICAL DATA:  Left-sided weakness EXAM: CT ANGIOGRAPHY HEAD AND NECK TECHNIQUE: Multidetector CT imaging of the head and neck was performed using the standard protocol during bolus administration of intravenous contrast. Multiplanar CT image reconstructions and MIPs were obtained to evaluate the vascular anatomy. Carotid stenosis measurements (when applicable) are obtained utilizing NASCET criteria, using the distal internal carotid diameter as the denominator. CONTRAST:  179mL OMNIPAQUE IOHEXOL 350 MG/ML SOLN COMPARISON:  None. FINDINGS: CTA NECK FINDINGS Aortic arch: Great vessel origins are patent. Right carotid system: Common, internal, and external carotid arteries are patent. There is mild calcified plaque along the common carotid. Mild calcified plaque is present at the ICA origin, noting streak artifact through this region. There is no hemodynamically significant stenosis or evidence of dissection. Left carotid system: Common, internal, and external carotid arteries are patent. Mild calcified plaque is present along the common carotid. There is primarily calcified plaque at the bifurcation and ICA origin causing minimal stenosis, noting streak artifact through this region. There is no hemodynamically significant stenosis or evidence of dissection.  Vertebral arteries: Dominant right vertebral artery is patent. Calcified plaque at its origin results in mild stenosis. Congenitally small caliber left vertebral artery is patent. Skeleton: Advanced degenerative changes of the cervical spine particularly from C4-C5 through C6-C7. Other neck: No neck mass or adenopathy. Diffuse paranasal sinus opacification with areas of  hyperdensity likely reflecting inspissation with fungal colonization not excluded. Upper chest: No apical lung mass. Review of the MIP images confirms the above findings CTA HEAD FINDINGS Anterior circulation: Intracranial internal carotid arteries are patent with calcified plaque along cavernous and paraclinoid portions causing mild stenosis. Anterior and middle cerebral arteries are patent. Posterior circulation: Intracranial vertebral arteries are patent. The left vertebral artery terminates as a PICA. Basilar artery is patent. Posterior cerebral arteries are patent. Posterior communicating arteries are present. Venous sinuses: Patent as permitted by contrast timing Anatomic variants: Fetal origin of the right PCA. Review of the MIP images confirms the above findings IMPRESSION: No proximal intracranial vessel occlusion. No occlusion or hemodynamically significant stenosis in the neck. Electronically Signed   By: Macy Mis M.D.   On: 05/31/2019 15:56   CARDIAC CATHETERIZATION  Result Date: 05/30/2019 Findings: RA = 8 RV = 58/12 PA = 67/13 (41) PCW = 24 Fick cardiac output/index = 6.4/4.0 PVR = 2.6 WU Ao sat = 99% PA sat = 67%, 68% High SVC sat = 69% Assessment: 1. Moderately elevated filling pressures with normal cardiac output Plan/Discussion: Continue IV diuresis. Given normal cardiac output would likely not benefit from inotropic support. Glori Bickers, MD 8:49 AM  US Abdomen Limited  Result Date: 06/19/2019 CLINICAL DATA:  Cirrhosis.  Abdominal distention. EXAM: LIMITED ABDOMEN ULTRASOUND FOR ASCITES TECHNIQUE: Limited  ultrasound survey for ascites was performed in all four abdominal quadrants. COMPARISON:  Ultrasound 06/05/2019.  CT 06/14/2019. FINDINGS: A small amount of ascites is noted, greatest in the left upper quadrant. Overall volume appears similar to the recent previous studies. IMPRESSION: Small amount of ascites, similar to recent prior studies. Electronically Signed   By: Richardean Sale M.D.   On: 06/19/2019 18:22   US Abdomen Limited  Result Date: 06/05/2019 CLINICAL DATA:  Ascites EXAM: LIMITED ABDOMEN ULTRASOUND FOR ASCITES TECHNIQUE: Limited ultrasound survey for ascites was performed in all four abdominal quadrants. COMPARISON:  CT abdomen pelvis 06/01/2019 FINDINGS: Small volume ascites identified in the 4 quadrants. IMPRESSION: Scattered small volume ascites. Electronically Signed   By: Lavonia Dana M.D.   On: 06/05/2019 13:15   DG Chest Portable 1 View  Result Date: 06/18/2019 CLINICAL DATA:  Discharge from the hospital yesterday. Family reports patient is lethargic and not eating. Slow to respond. EXAM: PORTABLE CHEST 1 VIEW COMPARISON:  06/09/2019. FINDINGS: Enlarged cardiopericardial silhouette, stable from the prior study. Stable left anterior chest wall AICD. No mediastinal or hilar masses. Prominent bronchovascular markings most evident in the bases. No evidence of pneumonia or pulmonary edema. No pleural effusion or pneumothorax. Skeletal structures are grossly intact. IMPRESSION: No acute cardiopulmonary disease. Electronically Signed   By: Lajean Manes M.D.   On: 06/18/2019 14:14   DG Chest Port 1 View  Result Date: 06/09/2019 CLINICAL DATA:  Pt unable to answer hx questions. Only moaned when Tech questioned her. Per nurse: Pt from [facility]. Reports difficulty breathing since 1100 this AM. Lethargic and SOB. 100% on RA, wheezing heard. 5 mg or albuterol given en route. EXAM: PORTABLE CHEST 1 VIEW COMPARISON:  Chest radiograph 06/05/2019 FINDINGS: Stable cardiomediastinal contours  with enlarged heart size. Left ICD in place. Central pulmonary vascular congestion. Diffuse mild bilateral interstitial opacities likely reflecting trace edema. No new focal consolidation. No pneumothorax or large pleural effusion. No acute finding in the visualized skeleton. IMPRESSION: Cardiomegaly with central pulmonary vascular congestion and mild bilateral interstitial opacities likely reflecting edema. Electronically Signed   By: Jac Canavan.D.  On: 06/09/2019 15:59   DG CHEST PORT 1 VIEW  Result Date: 06/05/2019 CLINICAL DATA:  Congestive heart failure. EXAM: PORTABLE CHEST 1 VIEW COMPARISON:  Single-view of the chest 05/31/2019. PA and lateral chest 05/26/2019. FINDINGS: There is cardiomegaly without edema. No consolidative process, pneumothorax or effusion. AICD is in place. IMPRESSION: Cardiomegaly without acute disease. Electronically Signed   By: Inge Rise M.D.   On: 06/05/2019 13:23   DG CHEST PORT 1 VIEW  Result Date: 05/31/2019 CLINICAL DATA:  Cough.  CHF. EXAM: PORTABLE CHEST 1 VIEW COMPARISON:  05/26/2019 FINDINGS: There is a left chest wall ICD with leads in the right atrial appendage and right ventricle. Cardiac enlargement. Aortic atherosclerosis. Pulmonary vascular congestion. No pleural effusion. No airspace opacities. IMPRESSION: 1. Similar appearance of cardiac enlargement and pulmonary vascular congestion. Electronically Signed   By: Kerby Moors M.D.   On: 05/31/2019 19:42   EEG adult  Result Date: 06/07/2019 Lora Havens, MD     06/07/2019 10:45 AM Patient Name: CHEILA WICKSTROM MRN: [id number] Epilepsy Attending: Lora Havens Referring Physician/Provider: Dr Darliss Cheney Date: 06/07/2019 Duration: 22.80mins Patient history: 73yo M with sudden onset of slurred speech and left sided weakness. EEG to evaluate for seizure Level of alertness: awake AEDs during EEG study: None Technical aspects: This EEG study was done with scalp electrodes positioned  according to the 10-20 International system of electrode placement. Electrical activity was acquired at a sampling rate of 500Hz  and reviewed with a high frequency filter of 70Hz  and a low frequency filter of 1Hz . EEG data were recorded continuously and digitally stored. DESCRIPTION: No clear posterior dominant rhythm was seen. EEG showed continuous generalized polymorphic 3-6hz  theta-delta slowing. Sharp transient was seen in right frontotemporal region. Hyperventilation and photic stimulation were not performed.  ABNORMALITY - Continuous slow, generalized  IMPRESSION: This study is suggestive of moderate diffuse encephalopathy, non specific to etiology. No seizures or definite epileptiform discharges were seen throughout the recording. If suspicion for interictal activity remains high, consider long term eeg monitoring. Lora Havens    EEG adult  Result Date: 05/31/2019 Lora Havens, MD     05/31/2019  4:41 PM Patient Name: TESHA ARCHAMBEAU MRN: [id number] Epilepsy Attending: Lora Havens Referring Physician/Provider: Dr Rosalin Hawking Date: 05/31/2019 Duration: 26.74mins Patient history: 73yo M with sudden onset of slurred speech and left sided weakness. EEG to evaluate for seizure Level of alertness: awake AEDs during EEG study: none Technical aspects: This EEG study was done with scalp electrodes positioned according to the 10-20 International system of electrode placement. Electrical activity was acquired at a sampling rate of 500Hz  and reviewed with a high frequency filter of 70Hz  and a low frequency filter of 1Hz . EEG data were recorded continuously and digitally stored. DESCRIPTION: No clear posterior dominant rhythm was seen. EEG showed continuous generalized polymorphic 3-6hz  theta-delta slowing. At times, generalized triphasic waves were also noted. Hyperventilation and photic stimulation were not performed. ABNORMALITY - Continuous slow, generalized - Triphasic waves, generalized IMPRESSION:  This study is suggestive of moderate diffuse encephalopathy, non specific to etiology, could be secondary to toxic-metabolic causes. No seizures or definite epileptiform discharges were seen throughout the recording. Priyanka Barbra Sarks   CT HEAD CODE STROKE WO CONTRAST`  Result Date: 05/31/2019 CLINICAL DATA:  Code stroke.  Left-sided weakness EXAM: CT HEAD WITHOUT CONTRAST TECHNIQUE: Contiguous axial images were obtained from the base of the skull through the vertex without intravenous contrast. COMPARISON:  CT head 04/15/2019 FINDINGS:  Brain: Ventricle size normal. Small hypodensity anterior limb internal capsule on the left unchanged from the prior study. Negative for acute infarct, hemorrhage, mass. No midline shift. Vascular: Negative for hyperdense vessel Skull: Negative Sinuses/Orbits: Extensive mucosal edema throughout the paranasal sinuses. Negative orbit. Other: None ASPECTS ([place] Stroke Program Early CT Score) - Ganglionic level infarction (caudate, lentiform nuclei, internal capsule, insula, M1-M3 cortex): 7 - Supraganglionic infarction (M4-M6 cortex): 3 Total score (0-10 with 10 being normal): 10 IMPRESSION: 1. No acute abnormality and no change from the prior study 2. ASPECTS is 10 Electronically Signed   By: Franchot Gallo M.D.   On: 05/31/2019 15:03     Time Spent in minutes  30     Desiree Hane M.D on 06/20/2019 at 12:08 PM  To page go to [email or web address] - password [email or web address]

## 2019-06-20 NOTE — Progress Notes (Signed)
Paged triad MD on call Baltazar Najjar) because patient had bloody stool although it was questionable if it was coming from rectum or cervix since patient also had a purwick placed.  Patient just had blood transfusion tonight and H&H has been placed for withdraw at 2300.

## 2019-06-21 DIAGNOSIS — R110 Nausea: Secondary | ICD-10-CM

## 2019-06-21 LAB — BASIC METABOLIC PANEL
Anion gap: 11 (ref 5–15)
BUN: 39 mg/dL — ABNORMAL HIGH (ref 8–23)
CO2: 21 mmol/L — ABNORMAL LOW (ref 22–32)
Calcium: 8.1 mg/dL — ABNORMAL LOW (ref 8.9–10.3)
Chloride: 107 mmol/L (ref 98–111)
Creatinine, Ser: 2.29 mg/dL — ABNORMAL HIGH (ref 0.44–1.00)
GFR calc Af Amer: 23 mL/min — ABNORMAL LOW (ref >60)
GFR calc non Af Amer: 20 mL/min — ABNORMAL LOW (ref >60)
Glucose, Bld: 103 mg/dL — ABNORMAL HIGH (ref 70–99)
Potassium: 3.5 mmol/L (ref 3.5–5.1)
Sodium: 139 mmol/L (ref 135–145)

## 2019-06-21 LAB — CBC
HCT: 27.5 % — ABNORMAL LOW (ref 36.0–46.0)
Hemoglobin: 9.3 g/dL — ABNORMAL LOW (ref 12.0–15.0)
MCH: 26.1 pg (ref 26.0–34.0)
MCHC: 33.8 g/dL (ref 30.0–36.0)
MCV: 77 fL — ABNORMAL LOW (ref 80.0–100.0)
Platelets: 232 10*3/uL (ref 150–400)
RBC: 3.57 MIL/uL — ABNORMAL LOW (ref 3.87–5.11)
RDW: 17.4 % — ABNORMAL HIGH (ref 11.5–15.5)
WBC: 12.1 10*3/uL — ABNORMAL HIGH (ref 4.0–10.5)
nRBC: 0 % (ref 0.0–0.2)

## 2019-06-21 MED ORDER — TRIMETHOBENZAMIDE HCL 300 MG PO CAPS: 300.0000 mg | ORAL_CAPSULE | Freq: Four times a day (QID) | ORAL | Status: DC | PRN

## 2019-06-21 MED ORDER — TORSEMIDE 20 MG PO TABS
40.0000 mg | ORAL_TABLET | Freq: Once | ORAL | Status: AC
  Administered 2019-06-21: 40 mg via ORAL
  Filled 2019-06-21: qty 2

## 2019-06-21 MED ORDER — TRIMETHOBENZAMIDE HCL 300 MG PO CAPS
300.0000 mg | ORAL_CAPSULE | Freq: Three times a day (TID) | ORAL | Status: DC | PRN
  Filled 2019-06-21: qty 1

## 2019-06-21 MED ORDER — PROCHLORPERAZINE EDISYLATE 10 MG/2ML IJ SOLN
10.0000 mg | Freq: Four times a day (QID) | INTRAMUSCULAR | Status: DC | PRN
  Administered 2019-06-21: 10 mg via INTRAVENOUS
  Filled 2019-06-21: qty 2

## 2019-06-21 NOTE — Progress Notes (Signed)
[facility]  PROGRESS NOTE  [name] TSV:[id number] DOB: 1945-09-07 DOA: 06/18/2019 PCP: Josetta Huddle, MD Admit date - 06/18/2019   Admitting Physician Desiree Hane, MD  Outpatient Primary MD for the patient is Josetta Huddle, MD  LOS - 2 Brief Narrative   Stephanie Yates is a 73 y.o. year old female with medical history significant for CHF/NICM (EF 10 to 15%), VT s/p [id number] and biventricular PPM, chronic anemia, CKD-4, HTN, HLD, moderate protein calorie malnutrition and GIB, prolonged QT/torsade with recent hospitalization from 12/13-12/21 who presented on 06/18/2019 with lethargy, decreased eating, decreased strength, and increased confusion and was found to have for CHF exacerbation and hepatic encephalopathy requiring lactulose.   Subjective  Complains of nausea Still eating ok with some abdominal discomfort No chest pain,no cough, no SOB A & P    Lethargy, suspect related to chronic comorbidities in the setting of advanced heart failure.  Ammonia mildly elevated at 50, this is likely contributing in addition to chronic comorbidities most notable for advanced end-stage CHF, cirrhosis, CKD.  TSH within normal limits.. -Continuing goals of care discussions (addressed below) -Address multiple comorbidities below  Goals of care discussion Palliative services as outpatient Discussed with patient's daughter and son over the phone on 12/24 about their mothers guarded prognosis given advanced CHF, worsening CKD, persistent anemia on blood thinners with decreased options for GI intervention, and overall deconditioning.  Family understands that mom is likely in end-of-life process. -Family would like to monitor for additional 24 to 48 hours with full scope of medical treatment -Reassess, to consider comfort care measures on 12/26 -Palliative care consulted to assist with goals of care discussions as well  Hypotension, persistent.   Low of 103/42 but seems to be  tolerating diuresis with torsemide. Suspect likely related to CHF.   Orthostatics negative. Has had some bright red blood but no overt bleeding --continue torsemide, increase to 40 mg  --closely monitor BP  AKI on CKD Stage 4, slightly improving likely pre-renal related to CHF. Initially presumed from overdiuresis but did not improve while holding home diuretics. Very complicated situation given advanced CHF and chronically ill. Cr 2.2, peak of 2.5 this admission   Baseline creatinine 1.7-2 --increased torsemide to 40 mg PO -No ACE/ARB/ARN I CKD stage IV -Not a candidate for advanced therapies previous heart failure evaluation by cardiology --family agreeable to no escalation in current scope of treatment  Acute on chronicCHFrEF, NYAHA IV, . Advanced end stage CHF, not a candidate for advanced CHF therapies on last cardiology evaluation while inpatient on 12/18.   BNP 1000, out 1 L on reduced torsemide, BP has done ok so would like to increase to 40 to see if this can help since she tends to build up fluid in abdomen which could explain her distension and nausea -increase torsemide 40 mg with hold parameters for BP --family does not want to escalate current medical treatment so will not consult cardiology -Daily weights, strict I's and O's, monitor output -Continue Coreg 3.125 twice daily  Mild troponin elevation  Troponin of 36--59.  Nonischemic EKG  Cirrhosis with small volume ascites Currently has abdominal distention, likely from CHF. Abd xr with small volume ascites. Not enough for paracentesis.    Ammonia slightly up at 50. Alert and oriented to self and place currently -Continue lactulose, increased to 20 g TID now having 2-3 BM daily -closely monitor  Nausea Likely related to abdominal distension from chf exacerbation. Still tolerating PO, no emesis On multiple  qtc prolonging agents. Last Qtc 520s -start PO tigan prn q6 H, with IV compazine for breakthrough   GERD,  stable -continue Protonix  Paroxysmal atrial fibrillation, currently rate controlled History of ventricular tachycardia status post AICD in 2014 a biventricular pacemaker.   No arrhythmias on review of telemetry -Continue amiodarone, mexiletine  --holding  Eliquis -monitor on telemetry  Hyperlipidemia, stable -Continue atorvastatin  Chronic microcytic anemia,  .  Consistent with iron deficiency anemia based on most recent iron panel. No current melena, no BRBPR.  History of multiple endoscopies to evaluate anemia/melena recently evaluation by GI (11/30) during last hospitalization did not recommend endoscopic evaluation at the time given no overt bleeding Hemoglobin stable at baseline 7-8 but had some reported blood streaks on pad. On DRE (12/24) no melena or blood seen, hgb improved after 1 PRBC on 12/25.  -Check CBC daily -Continue oral iron --protonix 40 mg po qd --discontinued eliquis  Reported vagina bleeding with noted clot -closely monitor -daily CBC -holding home eliquis  Moderate protein calorie malnutrition   Debility PT recommends SNF Daughter is agreeable if patient becomes agreeable Patient reports bad experience with [facility] facility in the past    Family Communication  : Spoke with daughter [name] and her son at [phone] on 12/24 discussed with her mom's heart is very weak with end stage CHF, as well as worsening kidney function, liver disease,  and has a guarded prognosis given her hypotension and kidney dysfunction. They would like to see if any improvement without escalation of care for next 24-48 hours before considering comfort care.   Code Status : DNR  Disposition Plan  : guarded prognosis, end stage acute on chronic CHF continue PO torsemide while monitoring BP (relative hypotension), monitor mental status.   Consults  : Palliative  Procedures  : None  DVT Prophylaxis  : SCDs, eliquis d/c'd due to bleeding  Lab Results  Component  Value Date   PLT 232 06/21/2019    Diet :  Diet Order            Diet Heart Room service appropriate? Yes; Fluid consistency: Thin  Diet effective now               Inpatient Medications Scheduled Meds: . amiodarone  200 mg Oral BID  . atorvastatin  40 mg Oral q1800  . carvedilol  3.125 mg Oral BID WC  . ferrous gluconate  324 mg Oral BID  . lactulose  20 g Oral TID  . magnesium oxide  400 mg Oral q morning - 10a  . mexiletine  200 mg Oral BID  . pantoprazole  40 mg Oral Daily  . pneumococcal 23 valent vaccine  0.5 mL Intramuscular Tomorrow-1000   Continuous Infusions:  PRN Meds:.acetaminophen, prochlorperazine, trimethobenzamide  Antibiotics  :   Anti-infectives (From admission, onward)   None       Objective   Vitals:   06/20/19 2100 06/21/19 0042 06/21/19 0554 06/21/19 1215  BP:  137/61 (!) 103/42 119/62  Pulse:  62 (!) 59 62  Resp: 14 19 15 19   Temp: 97.9 F (36.6 C)  98 F (36.7 C)   TempSrc: Oral  Oral   SpO2: 94% 95%  92%  Weight:   58.4 kg   Height:        SpO2: 92 %  Wt Readings from Last 3 Encounters:  06/21/19 58.4 kg  06/17/19 57.3 kg  06/07/19 61.9 kg     Intake/Output Summary (Last 24 hours)  at 06/21/2019 1510 Last data filed at 06/20/2019 2220 Gross per 24 hour  Intake 520 ml  Output 600 ml  Net -80 ml    Physical Exam:  Awake Alert, oriented to self, place Slow speech but follows all commands with no appreciable deficits [place].AT, No appreciable JVD Symmetrical Chest wall movement, Good air movement bilaterally on room air, no respiratory distress RRR,No Gallops,Rubs or new Murmurs,  +ve B.Sounds, Abd Soft and distended with  no tenderness, No rebound, guarding or rigidity. No peripheral edema    I have personally reviewed the following:   Data Reviewed:  CBC Recent Labs  Lab 06/16/19 0401 06/18/19 1300 06/19/19 0541 06/20/19 0405 06/21/19 0104  WBC 15.7* 16.3* 16.8* 12.0* 12.1*  HGB 7.7* 8.5* 7.8* 7.3* 9.3*   HCT 23.5* 25.9* 23.9* 21.8* 27.5*  PLT 259 246 248 208 232  MCV 76.3* 77.1* 76.8* 76.5* 77.0*  MCH 25.0* 25.3* 25.1* 25.6* 26.1  MCHC 32.8 32.8 32.6 33.5 33.8  RDW 17.5* 17.6* 17.8* 18.0* 17.4*  LYMPHSABS 1.8 1.7  --   --   --   MONOABS 1.4* 1.5*  --   --   --   EOSABS 0.2 0.2  --   --   --   BASOSABS 0.1 0.1  --   --   --     Chemistries  Recent Labs  Lab 06/16/19 0401 06/16/19 1436 06/17/19 0607 06/18/19 1300 06/19/19 0541 06/20/19 0405 06/21/19 0104  NA 140  --  138 135 138 138 139  K 3.8  --  4.0 4.0 3.9 3.5 3.5  CL 103  --  102 99 103 101 107  CO2 27  --  27 26 24 26  21*  GLUCOSE 102*  --  82 112* 78 90 103*  BUN 28*  --  31* 37* 37* 40* 39*  CREATININE 1.96*  --  1.87* 2.56* 2.48* 2.52* 2.29*  CALCIUM 8.1*  --  8.1* 8.3* 8.2* 8.1* 8.1*  MG  --  2.0  --   --   --   --   --   AST 63*  --   --  69* 60*  --   --   ALT 30  --   --  29 28  --   --   ALKPHOS 112  --   --  108 104  --   --   BILITOT 0.9  --   --  1.6* 1.6*  --   --    ------------------------------------------------------------------------------------------------------------------ No results for input(s): CHOL, HDL, LDLCALC, TRIG, CHOLHDL, LDLDIRECT in the last 72 hours.  Lab Results  Component Value Date   HGBA1C 5.6 05/31/2019   ------------------------------------------------------------------------------------------------------------------ Recent Labs    06/19/19 1904  TSH 3.669   ------------------------------------------------------------------------------------------------------------------ No results for input(s): VITAMINB12, FOLATE, FERRITIN, TIBC, IRON, RETICCTPCT in the last 72 hours.  Coagulation profile No results for input(s): INR, PROTIME in the last 168 hours.  No results for input(s): DDIMER in the last 72 hours.  Cardiac Enzymes No results for input(s): CKMB, TROPONINI, MYOGLOBIN in the last 168 hours.  Invalid input(s):  CK ------------------------------------------------------------------------------------------------------------------    Component Value Date/Time   BNP 1,324.0 (H) 06/19/2019 1634   BNP 367.0 (H) 03/16/2016 1457    Micro Results Recent Results (from the past 240 hour(s))  SARS CORONAVIRUS 2 (TAT 6-24 HRS) Nasopharyngeal Nasopharyngeal Swab     Status: None   Collection Time: 06/18/19  5:58 PM   Specimen: Nasopharyngeal Swab  Result Value Ref Range  Status   SARS Coronavirus 2 NEGATIVE NEGATIVE Final    Comment: (NOTE) SARS-CoV-2 target nucleic acids are NOT DETECTED. The SARS-CoV-2 RNA is generally detectable in upper and lower respiratory specimens during the acute phase of infection. Negative results do not preclude SARS-CoV-2 infection, do not rule out co-infections with other pathogens, and should not be used as the sole basis for treatment or other patient management decisions. Negative results must be combined with clinical observations, patient history, and epidemiological information. The expected result is Negative. Fact Sheet for Patients: SugarRoll.be Fact Sheet for Healthcare Providers: https://www.woods-mathews.com/ This test is not yet approved or cleared by the [place] FDA and  has been authorized for detection and/or diagnosis of SARS-CoV-2 by FDA under an Emergency Use Authorization (EUA). This EUA will remain  in effect (meaning this test can be used) for the duration of the COVID-19 declaration under Section 56 4(b)(1) of the Act, 21 U.S.C. section 360bbb-3(b)(1), unless the authorization is terminated or revoked sooner. Performed at [facility], [place] [address] [place], [place] [address]     Radiology Reports CT ABDOMEN PELVIS WO CONTRAST  Result Date: 06/14/2019 CLINICAL DATA:  Acute generalized abdominal pain. EXAM: CT ABDOMEN AND PELVIS WITHOUT CONTRAST TECHNIQUE: Multidetector CT imaging of the  abdomen and pelvis was performed following the standard protocol without IV contrast. COMPARISON:  06/01/2019 FINDINGS: Lower chest: Mild bibasilar atelectasis. Hepatobiliary: Findings suspicious for hepatic cirrhosis are again demonstrated. No liver masses identified on this unenhanced exam. Gallbladder is unremarkable. No evidence of biliary ductal dilatation. Pancreas: No mass or inflammatory process visualized on this unenhanced exam. Spleen:  Within normal limits in size. Adrenals/Urinary tract: Stable 3.5 cm low-attenuation right adrenal mass, consistent with benign adenoma. A few tiny less than 5 mm renal calculi are again seen bilaterally, however there is no evidence of ureteral calculi or hydronephrosis. Unremarkable unopacified urinary bladder. Stomach/Bowel: No evidence of obstruction, inflammatory process, or abnormal fluid collections. Diverticulosis is seen mainly involving the descending and sigmoid colon, however there is no evidence of diverticulitis. Vascular/Lymphatic: No pathologically enlarged lymph nodes identified. No evidence of abdominal aortic aneurysm. Aortic atherosclerosis incidentally noted. Reproductive: Small uterine fibroids some of which are calcified are again noted. Adnexal regions are unremarkable. Other: Diffuse mesenteric and body wall edema, and mild ascites, have mildly increased since previous study. Musculoskeletal:  No suspicious bone lesions identified. IMPRESSION: Mild increase in mild ascites and diffuse mesenteric and body wall edema since prior study. No focal inflammatory process or abscess identified. Probable hepatic cirrhosis. Colonic diverticulosis, without radiographic evidence of diverticulitis. Bilateral nephrolithiasis. No evidence of ureteral calculi or hydronephrosis. Stable small uterine fibroids. Stable benign right adrenal adenoma. Aortic Atherosclerosis (ICD10-I70.0). Electronically Signed   By: Marlaine Hind M.D.   On: 06/14/2019 18:21   CT ABDOMEN  PELVIS WO CONTRAST  Result Date: 06/01/2019 CLINICAL DATA:  Abdominal pain.  Concern for diverticulitis. EXAM: CT ABDOMEN AND PELVIS WITHOUT CONTRAST TECHNIQUE: Multidetector CT imaging of the abdomen and pelvis was performed following the standard protocol without IV contrast. COMPARISON:  05/26/2019 FINDINGS: The lack of intravenous contrast limits the ability to evaluate solid abdominal organs. Lower chest: Limited visualization of lower thorax demonstrates minimal bibasilar heterogeneous opacities, left greater than right, improved compared to the 11/29 examination. No pleural effusion. Cardiomegaly. Pacer leads again terminate within the right atrium and ventricle. No pericardial effusion. Hepatobiliary: Nodularity hepatic contour. There is diffuse decreased attempt increased attenuation of the hepatic parenchyma as could be seen in the setting  of chronic amiodarone therapy. Trace amount of perihepatic ascites, unchanged to slightly progressed compared to the 05/26/2019 examination. Normal noncontrast appearance of the gallbladder. No radiopaque gallstones. Pancreas: Normal noncontrast appearance of the pancreas. Spleen: Normal noncontrast appearance of the spleen. Adrenals/Urinary Tract: The bilateral kidneys again appear atrophic. Punctate nonobstructing renal stones are again seen bilaterally with dominant nonobstructing stone within the inferior pole the right kidney measuring approximately 4 mm in diameter (image 38, series 3) and dominant nonobstructing stone within the inferior pole the left kidney measuring approximately 2 mm (image 36, series 3). No renal stones are seen along the expected course of either ureter. Excreted contrast from previous contrast enhance head and neck CTA is seen within the urinary bladder. No urinary obstruction or perinephric stranding. Redemonstrated approximately 3.8 x 1.9 cm hypoattenuating right-sided adrenal adenoma. Normal noncontrast appearance of the left adrenal  gland. Stomach/Bowel: Ingested enteric contrast extends to the level of the hepatic flexure of the colon. Redemonstrated small right-sided inguinal hernia which is noted to contain a short-segment of nondilated small bowel (representative coronal images 39 through 7, series 6), not resulting in enteric obstruction. Moderate colonic stool burden. Scattered colonic diverticulosis without evidence of superimposed acute diverticulitis. Normal appearance of the terminal ileum. The appendix is not visualized, however there is no definitive pericecal inflammatory change on this noncontrast examination. No pneumoperitoneum, pneumatosis or portal venous gas. Vascular/Lymphatic: Atherosclerotic plaque within normal caliber abdominal aorta. No bulky retroperitoneal, mesenteric, pelvic or inguinal lymphadenopathy. Reproductive: Dystrophic calcifications, presumed degenerating fibroids within and expectedly atrophic uterus. No discrete adnexal lesion. Trace amount of fluid in the pelvic cul-de-sac. Other: Moderate amount of diffuse body wall anasarca potentially secondary to third spacing. Musculoskeletal: No acute or aggressive osseous abnormalities. Grade 1 anterolisthesis of L4 upon L5 and L5 upon S1 without associated pars defects. A bone island is noted within the left acetabulum. IMPRESSION: 1. No explanation for patient's abdominal pain. Specifically, no evidence enteric obstruction or diverticulitis on this noncontrast examination. 2. Redemonstrated right inguinal hernia containing nondilated loops of small bowel and not resulting in enteric obstruction. 3. Nonobstructive bilateral nephrolithiasis unchanged. 4. Cardiomegaly with diffuse body wall anasarca as could be seen in the setting early CHF. Clinical correlation is advised. 5. Suspected early cirrhotic change with minimal amount perihepatic ascites. 6.  Aortic Atherosclerosis (ICD10-I70.0). Electronically Signed   By: Sandi Mariscal M.D.   On: 06/01/2019 12:08   CT  ABDOMEN PELVIS WO CONTRAST  Result Date: 05/26/2019 CLINICAL DATA:  73 year old female with history of abdominal distension. Diffuse abdominal pain, most severe on the right side. EXAM: CT ABDOMEN AND PELVIS WITHOUT CONTRAST TECHNIQUE: Multidetector CT imaging of the abdomen and pelvis was performed following the standard protocol without IV contrast. COMPARISON:  CT the abdomen and pelvis 03/17/2019. FINDINGS: Lower chest: Mild scarring in the visualized lung bases. Cardiomegaly. Atherosclerotic calcifications in the left anterior descending, left circumflex and right coronary arteries. Pacemaker leads terminating in the right atrium and right ventricular apex. Hepatobiliary: Liver has a slightly shrunken appearance and nodular contour, suggesting underlying cirrhosis. No definite suspicious cystic or solid hepatic lesions are confidently identified on today's noncontrast CT examination. Unenhanced appearance of the gallbladder is normal. Pancreas: No definite pancreatic mass or peripancreatic fluid collections or inflammatory changes are noted on today's noncontrast CT examination. Spleen: Unremarkable. Adrenals/Urinary Tract: Multiple nonobstructive calculi are noted within the collecting systems of both kidneys, largest of which measures up to 4 mm in the lower pole collecting system of the right kidney. No additional  calculi are confidently identified along the course of either ureter or within the lumen of the urinary bladder. No hydroureteronephrosis. Unenhanced appearance of the kidneys is otherwise normal. Left adrenal gland is normal in appearance. 4.5 x 2.1 cm low-attenuation (-27 HU) right adrenal nodule, similar to prior studies, compatible with an adrenal myelolipoma. Unenhanced appearance of the urinary bladder is normal. Stomach/Bowel: Unenhanced appearance of the stomach is normal. No pathologic dilatation of small bowel or colon. Multiple loops of small bowel appear to extend into a large right  inguinal hernia. The appendix is not confidently identified and may be surgically absent. Regardless, there are no inflammatory changes noted adjacent to the cecum to suggest the presence of an acute appendicitis at this time. Numerous colonic diverticulae are noted, without surrounding inflammatory changes to suggest an acute diverticulitis at this time. Vascular/Lymphatic: Aortic atherosclerosis. No lymphadenopathy noted in the abdomen or pelvis confidently identified on today's noncontrast CT examination. Reproductive: Uterus is markedly heterogeneous in appearance with multiple coarse calcifications, compatible with a fibroid uterus. Ovaries are unremarkable in appearance. Other: Trace volume of ascites.  No pneumoperitoneum. Musculoskeletal: There are no aggressive appearing lytic or blastic lesions noted in the visualized portions of the skeleton. IMPRESSION: 1. Large right inguinal hernia containing several loops of small bowel, without evidence of bowel incarceration or obstruction. 2. Numerous small nonobstructive calculi are noted in the collecting systems of both kidneys measuring up to 4 mm in the lower pole. No ureteral stones or findings of urinary tract obstruction are noted at this time. 3. Colonic diverticulosis without evidence of acute diverticulitis at this time. 4. Severe cardiomegaly. 5. Aortic atherosclerosis, in addition to least 3 vessel coronary artery disease. Assessment for potential risk factor modification, dietary therapy or pharmacologic therapy may be warranted, if clinically indicated. 6. Morphologic changes in the liver suggestive of underlying cirrhosis. 7. Additional incidental findings, as above. Electronically Signed   By: Vinnie Langton M.D.   On: 05/26/2019 18:27   CT ANGIO HEAD W OR WO CONTRAST  Result Date: 05/31/2019 CLINICAL DATA:  Left-sided weakness EXAM: CT ANGIOGRAPHY HEAD AND NECK TECHNIQUE: Multidetector CT imaging of the head and neck was performed using the  standard protocol during bolus administration of intravenous contrast. Multiplanar CT image reconstructions and MIPs were obtained to evaluate the vascular anatomy. Carotid stenosis measurements (when applicable) are obtained utilizing NASCET criteria, using the distal internal carotid diameter as the denominator. CONTRAST:  158mL OMNIPAQUE IOHEXOL 350 MG/ML SOLN COMPARISON:  None. FINDINGS: CTA NECK FINDINGS Aortic arch: Great vessel origins are patent. Right carotid system: Common, internal, and external carotid arteries are patent. There is mild calcified plaque along the common carotid. Mild calcified plaque is present at the ICA origin, noting streak artifact through this region. There is no hemodynamically significant stenosis or evidence of dissection. Left carotid system: Common, internal, and external carotid arteries are patent. Mild calcified plaque is present along the common carotid. There is primarily calcified plaque at the bifurcation and ICA origin causing minimal stenosis, noting streak artifact through this region. There is no hemodynamically significant stenosis or evidence of dissection. Vertebral arteries: Dominant right vertebral artery is patent. Calcified plaque at its origin results in mild stenosis. Congenitally small caliber left vertebral artery is patent. Skeleton: Advanced degenerative changes of the cervical spine particularly from C4-C5 through C6-C7. Other neck: No neck mass or adenopathy. Diffuse paranasal sinus opacification with areas of hyperdensity likely reflecting inspissation with fungal colonization not excluded. Upper chest: No apical lung mass. Review  of the MIP images confirms the above findings CTA HEAD FINDINGS Anterior circulation: Intracranial internal carotid arteries are patent with calcified plaque along cavernous and paraclinoid portions causing mild stenosis. Anterior and middle cerebral arteries are patent. Posterior circulation: Intracranial vertebral arteries  are patent. The left vertebral artery terminates as a PICA. Basilar artery is patent. Posterior cerebral arteries are patent. Posterior communicating arteries are present. Venous sinuses: Patent as permitted by contrast timing Anatomic variants: Fetal origin of the right PCA. Review of the MIP images confirms the above findings IMPRESSION: No proximal intracranial vessel occlusion. No occlusion or hemodynamically significant stenosis in the neck. Electronically Signed   By: Macy Mis M.D.   On: 05/31/2019 15:56   DG Chest 2 View  Result Date: 05/26/2019 CLINICAL DATA:  Patient with right-sided abdominal pain. History of CHF. EXAM: CHEST - 2 VIEW COMPARISON:  Chest radiograph 02/20/2019 FINDINGS: Multi lead AICD device overlies the left hemithorax. Leads are stable in position. Stable cardiomegaly. Tortuosity of the thoracic aorta. Heterogeneous opacities left lung base. Pulmonary vascular redistribution. Trace bilateral pleural effusions. Thoracic spine degenerative changes. IMPRESSION: Cardiomegaly with pulmonary vascular redistribution and mild interstitial edema. Heterogeneous opacities left lung base may represent atelectasis. Probable trace bilateral pleural effusions. Electronically Signed   By: Lovey Newcomer M.D.   On: 05/26/2019 20:01   DG Chest 2 View  Result Date: 05/23/2019 CLINICAL DATA:  Shortness of breath EXAM: CHEST - 2 VIEW COMPARISON:  Radiograph 05/05/2019 FINDINGS: Cardiomegaly is similar to slightly increased from comparison AP radiography. Pacer/defibrillator pack overlies the left chest wall leads in the cardiac apex and right atrium. There is a mildly increased interstitial opacity with fissural and septal thickening and indistinct pulmonary vascularity. No consolidative opacity. Atelectatic changes in the lung bases. No visible pneumothorax or effusion. The osseous structures appear diffusely demineralized which may limit detection of small or nondisplaced fractures. No acute  osseous or soft tissue abnormality. Degenerative changes are present in the imaged spine and shoulders. IMPRESSION: 1. Cardiomegaly with mild interstitial pulmonary edema, similar to slightly increased from prior study. 2. Bibasilar atelectasis. No consolidation. Electronically Signed   By: Lovena Le M.D.   On: 05/23/2019 01:17   CT Head Wo Contrast  Result Date: 06/18/2019 CLINICAL DATA:  Lethargic and not eating EXAM: CT HEAD WITHOUT CONTRAST TECHNIQUE: Contiguous axial images were obtained from the base of the skull through the vertex without intravenous contrast. COMPARISON:  06/04/2019 FINDINGS: Brain: There is no acute intracranial hemorrhage, mass-effect, or edema. Gray-white differentiation is preserved. There is no extra-axial fluid collection. Ventricles and sulci are within normal limits in size and configuration. Patchy hypoattenuation in the supratentorial white matter is nonspecific but likely reflects stable chronic microvascular ischemic changes. Vascular: There is atherosclerotic calcification at the skull base. Skull: Calvarium is unremarkable. Sinuses/Orbits: Persistent paranasal sinus opacification with areas of high density likely reflecting inspissation. Orbits are unremarkable. Other: None. IMPRESSION: No acute intracranial abnormality. Stable chronic findings detailed above. Electronically Signed   By: Macy Mis M.D.   On: 06/18/2019 13:45   CT HEAD WO CONTRAST  Result Date: 06/04/2019 CLINICAL DATA:  Left-sided weakness EXAM: CT HEAD WITHOUT CONTRAST TECHNIQUE: Contiguous axial images were obtained from the base of the skull through the vertex without intravenous contrast. COMPARISON:  06/01/2019 FINDINGS: Brain: There is no acute intracranial hemorrhage, mass-effect, or edema. There is no new loss of gray-white differentiation. Patchy hypoattenuation in the supratentorial white matter is nonspecific but likely reflects stable chronic microvascular ischemic changes. There is  no extra-axial fluid collection. Ventricles and sulci are stable in size and configuration. Vascular: There is atherosclerotic calcification at the skull base. Skull: Calvarium is unremarkable. Sinuses/Orbits: Persistent diffuse polypoid mucosal thickening with areas of increased density likely reflecting inspissation. Other: None. IMPRESSION: No significant change since 06/01/2019. Electronically Signed   By: Macy Mis M.D.   On: 06/04/2019 14:21   CT HEAD WO CONTRAST  Result Date: 06/01/2019 CLINICAL DATA:  Encephalopathy. Follow-up for question of left basal ganglia region stroke. EXAM: CT HEAD WITHOUT CONTRAST TECHNIQUE: Contiguous axial images were obtained from the base of the skull through the vertex without intravenous contrast. COMPARISON:  CT 05/31/2019, 05/30/2019 and 04/15/2019. FINDINGS: Brain: No acute finding. The brainstem and cerebellum appear normal. Cerebral hemispheres show mild chronic small-vessel ischemic change of the white matter. This includes an area of low-density in the anterior limb internal capsule on the left which was present in October in therefore not acute. No sign of acute infarction, mass lesion, hemorrhage, hydrocephalus or extra-axial collection. Vascular: There is atherosclerotic calcification of the major vessels at the base of the brain. Skull: Negative Sinuses/Orbits: Widespread sinus opacification consistent with rhinosinusitis. Orbits negative. Other: None IMPRESSION: No acute intracranial finding. Chronic small-vessel ischemic changes of the white matter. Low-density in the anterior limb internal capsule on the left question as acute on the recent study was, I think, present on 04/15/2019 and therefore not acute. Widespread rhinosinusitis. Electronically Signed   By: Nelson Chimes M.D.   On: 06/01/2019 11:12   CT HEAD WO CONTRAST  Result Date: 05/30/2019 CLINICAL DATA:  Vertigo EXAM: CT HEAD WITHOUT CONTRAST TECHNIQUE: Contiguous axial images were obtained  from the base of the skull through the vertex without intravenous contrast. COMPARISON:  04/15/2019 FINDINGS: Brain: No acute intracranial abnormality. Specifically, no hemorrhage, hydrocephalus, mass lesion, acute infarction, or significant intracranial injury. Vascular: No hyperdense vessel or unexpected calcification. Skull: No acute calvarial abnormality. Sinuses/Orbits: Diffuse disease throughout the paranasal sinuses with extensive mucosal thickening. Near complete opacification of the maxillary, frontal and sphenoid sinuses. Mastoid air cells clear. Other: None IMPRESSION: No acute intracranial abnormality. Severe chronic sinusitis. Electronically Signed   By: Rolm Baptise M.D.   On: 05/30/2019 21:40   CT ANGIO NECK W OR WO CONTRAST  Result Date: 05/31/2019 CLINICAL DATA:  Left-sided weakness EXAM: CT ANGIOGRAPHY HEAD AND NECK TECHNIQUE: Multidetector CT imaging of the head and neck was performed using the standard protocol during bolus administration of intravenous contrast. Multiplanar CT image reconstructions and MIPs were obtained to evaluate the vascular anatomy. Carotid stenosis measurements (when applicable) are obtained utilizing NASCET criteria, using the distal internal carotid diameter as the denominator. CONTRAST:  135mL OMNIPAQUE IOHEXOL 350 MG/ML SOLN COMPARISON:  None. FINDINGS: CTA NECK FINDINGS Aortic arch: Great vessel origins are patent. Right carotid system: Common, internal, and external carotid arteries are patent. There is mild calcified plaque along the common carotid. Mild calcified plaque is present at the ICA origin, noting streak artifact through this region. There is no hemodynamically significant stenosis or evidence of dissection. Left carotid system: Common, internal, and external carotid arteries are patent. Mild calcified plaque is present along the common carotid. There is primarily calcified plaque at the bifurcation and ICA origin causing minimal stenosis, noting  streak artifact through this region. There is no hemodynamically significant stenosis or evidence of dissection. Vertebral arteries: Dominant right vertebral artery is patent. Calcified plaque at its origin results in mild stenosis. Congenitally small caliber left vertebral artery is patent. Skeleton: Advanced  degenerative changes of the cervical spine particularly from C4-C5 through C6-C7. Other neck: No neck mass or adenopathy. Diffuse paranasal sinus opacification with areas of hyperdensity likely reflecting inspissation with fungal colonization not excluded. Upper chest: No apical lung mass. Review of the MIP images confirms the above findings CTA HEAD FINDINGS Anterior circulation: Intracranial internal carotid arteries are patent with calcified plaque along cavernous and paraclinoid portions causing mild stenosis. Anterior and middle cerebral arteries are patent. Posterior circulation: Intracranial vertebral arteries are patent. The left vertebral artery terminates as a PICA. Basilar artery is patent. Posterior cerebral arteries are patent. Posterior communicating arteries are present. Venous sinuses: Patent as permitted by contrast timing Anatomic variants: Fetal origin of the right PCA. Review of the MIP images confirms the above findings IMPRESSION: No proximal intracranial vessel occlusion. No occlusion or hemodynamically significant stenosis in the neck. Electronically Signed   By: Macy Mis M.D.   On: 05/31/2019 15:56   CARDIAC CATHETERIZATION  Result Date: 05/30/2019 Findings: RA = 8 RV = 58/12 PA = 67/13 (41) PCW = 24 Fick cardiac output/index = 6.4/4.0 PVR = 2.6 WU Ao sat = 99% PA sat = 67%, 68% High SVC sat = 69% Assessment: 1. Moderately elevated filling pressures with normal cardiac output Plan/Discussion: Continue IV diuresis. Given normal cardiac output would likely not benefit from inotropic support. Glori Bickers, MD 8:49 AM  US Abdomen Limited  Result Date: 06/19/2019 CLINICAL  DATA:  Cirrhosis.  Abdominal distention. EXAM: LIMITED ABDOMEN ULTRASOUND FOR ASCITES TECHNIQUE: Limited ultrasound survey for ascites was performed in all four abdominal quadrants. COMPARISON:  Ultrasound 06/05/2019.  CT 06/14/2019. FINDINGS: A small amount of ascites is noted, greatest in the left upper quadrant. Overall volume appears similar to the recent previous studies. IMPRESSION: Small amount of ascites, similar to recent prior studies. Electronically Signed   By: Richardean Sale M.D.   On: 06/19/2019 18:22   US Abdomen Limited  Result Date: 06/05/2019 CLINICAL DATA:  Ascites EXAM: LIMITED ABDOMEN ULTRASOUND FOR ASCITES TECHNIQUE: Limited ultrasound survey for ascites was performed in all four abdominal quadrants. COMPARISON:  CT abdomen pelvis 06/01/2019 FINDINGS: Small volume ascites identified in the 4 quadrants. IMPRESSION: Scattered small volume ascites. Electronically Signed   By: Lavonia Dana M.D.   On: 06/05/2019 13:15   DG Chest Portable 1 View  Result Date: 06/18/2019 CLINICAL DATA:  Discharge from the hospital yesterday. Family reports patient is lethargic and not eating. Slow to respond. EXAM: PORTABLE CHEST 1 VIEW COMPARISON:  06/09/2019. FINDINGS: Enlarged cardiopericardial silhouette, stable from the prior study. Stable left anterior chest wall AICD. No mediastinal or hilar masses. Prominent bronchovascular markings most evident in the bases. No evidence of pneumonia or pulmonary edema. No pleural effusion or pneumothorax. Skeletal structures are grossly intact. IMPRESSION: No acute cardiopulmonary disease. Electronically Signed   By: Lajean Manes M.D.   On: 06/18/2019 14:14   DG Chest Port 1 View  Result Date: 06/09/2019 CLINICAL DATA:  Pt unable to answer hx questions. Only moaned when Tech questioned her. Per nurse: Pt from [facility]. Reports difficulty breathing since 1100 this AM. Lethargic and SOB. 100% on RA, wheezing heard. 5 mg or albuterol given en route. EXAM:  PORTABLE CHEST 1 VIEW COMPARISON:  Chest radiograph 06/05/2019 FINDINGS: Stable cardiomediastinal contours with enlarged heart size. Left ICD in place. Central pulmonary vascular congestion. Diffuse mild bilateral interstitial opacities likely reflecting trace edema. No new focal consolidation. No pneumothorax or large pleural effusion. No acute finding in the visualized skeleton.  IMPRESSION: Cardiomegaly with central pulmonary vascular congestion and mild bilateral interstitial opacities likely reflecting edema. Electronically Signed   By: Audie Pinto M.D.   On: 06/09/2019 15:59   DG CHEST PORT 1 VIEW  Result Date: 06/05/2019 CLINICAL DATA:  Congestive heart failure. EXAM: PORTABLE CHEST 1 VIEW COMPARISON:  Single-view of the chest 05/31/2019. PA and lateral chest 05/26/2019. FINDINGS: There is cardiomegaly without edema. No consolidative process, pneumothorax or effusion. AICD is in place. IMPRESSION: Cardiomegaly without acute disease. Electronically Signed   By: Inge Rise M.D.   On: 06/05/2019 13:23   DG CHEST PORT 1 VIEW  Result Date: 05/31/2019 CLINICAL DATA:  Cough.  CHF. EXAM: PORTABLE CHEST 1 VIEW COMPARISON:  05/26/2019 FINDINGS: There is a left chest wall ICD with leads in the right atrial appendage and right ventricle. Cardiac enlargement. Aortic atherosclerosis. Pulmonary vascular congestion. No pleural effusion. No airspace opacities. IMPRESSION: 1. Similar appearance of cardiac enlargement and pulmonary vascular congestion. Electronically Signed   By: Kerby Moors M.D.   On: 05/31/2019 19:42   EEG adult  Result Date: 06/07/2019 Lora Havens, MD     06/07/2019 10:45 AM Patient Name: Stephanie Yates MRN: [id number] Epilepsy Attending: Lora Havens Referring Physician/Provider: Dr Darliss Cheney Date: 06/07/2019 Duration: 22.8mins Patient history: 73yo M with sudden onset of slurred speech and left sided weakness. EEG to evaluate for seizure Level of alertness: awake  AEDs during EEG study: None Technical aspects: This EEG study was done with scalp electrodes positioned according to the 10-20 International system of electrode placement. Electrical activity was acquired at a sampling rate of 500Hz  and reviewed with a high frequency filter of 70Hz  and a low frequency filter of 1Hz . EEG data were recorded continuously and digitally stored. DESCRIPTION: No clear posterior dominant rhythm was seen. EEG showed continuous generalized polymorphic 3-6hz  theta-delta slowing. Sharp transient was seen in right frontotemporal region. Hyperventilation and photic stimulation were not performed.  ABNORMALITY - Continuous slow, generalized  IMPRESSION: This study is suggestive of moderate diffuse encephalopathy, non specific to etiology. No seizures or definite epileptiform discharges were seen throughout the recording. If suspicion for interictal activity remains high, consider long term eeg monitoring. Lora Havens    EEG adult  Result Date: 05/31/2019 Lora Havens, MD     05/31/2019  4:41 PM Patient Name: KATERIN NEGRETE MRN: [id number] Epilepsy Attending: Lora Havens Referring Physician/Provider: Dr Rosalin Hawking Date: 05/31/2019 Duration: 26.80mins Patient history: 73yo M with sudden onset of slurred speech and left sided weakness. EEG to evaluate for seizure Level of alertness: awake AEDs during EEG study: none Technical aspects: This EEG study was done with scalp electrodes positioned according to the 10-20 International system of electrode placement. Electrical activity was acquired at a sampling rate of 500Hz  and reviewed with a high frequency filter of 70Hz  and a low frequency filter of 1Hz . EEG data were recorded continuously and digitally stored. DESCRIPTION: No clear posterior dominant rhythm was seen. EEG showed continuous generalized polymorphic 3-6hz  theta-delta slowing. At times, generalized triphasic waves were also noted. Hyperventilation and photic stimulation  were not performed. ABNORMALITY - Continuous slow, generalized - Triphasic waves, generalized IMPRESSION: This study is suggestive of moderate diffuse encephalopathy, non specific to etiology, could be secondary to toxic-metabolic causes. No seizures or definite epileptiform discharges were seen throughout the recording. Priyanka Barbra Sarks   CT HEAD CODE STROKE WO CONTRAST`  Result Date: 05/31/2019 CLINICAL DATA:  Code stroke.  Left-sided weakness EXAM: CT HEAD  WITHOUT CONTRAST TECHNIQUE: Contiguous axial images were obtained from the base of the skull through the vertex without intravenous contrast. COMPARISON:  CT head 04/15/2019 FINDINGS: Brain: Ventricle size normal. Small hypodensity anterior limb internal capsule on the left unchanged from the prior study. Negative for acute infarct, hemorrhage, mass. No midline shift. Vascular: Negative for hyperdense vessel Skull: Negative Sinuses/Orbits: Extensive mucosal edema throughout the paranasal sinuses. Negative orbit. Other: None ASPECTS ([place] Stroke Program Early CT Score) - Ganglionic level infarction (caudate, lentiform nuclei, internal capsule, insula, M1-M3 cortex): 7 - Supraganglionic infarction (M4-M6 cortex): 3 Total score (0-10 with 10 being normal): 10 IMPRESSION: 1. No acute abnormality and no change from the prior study 2. ASPECTS is 10 Electronically Signed   By: Franchot Gallo M.D.   On: 05/31/2019 15:03     Time Spent in minutes  30     Desiree Hane M.D on 06/21/2019 at 3:10 PM  To page go to [email or web address] - password [facility]

## 2019-06-22 ENCOUNTER — Inpatient Hospital Stay (HOSPITAL_COMMUNITY): Payer: Medicare Other

## 2019-06-22 DIAGNOSIS — R042 Hemoptysis: Secondary | ICD-10-CM

## 2019-06-22 DIAGNOSIS — Z515 Encounter for palliative care: Secondary | ICD-10-CM

## 2019-06-22 DIAGNOSIS — Z789 Other specified health status: Secondary | ICD-10-CM

## 2019-06-22 DIAGNOSIS — J9621 Acute and chronic respiratory failure with hypoxia: Secondary | ICD-10-CM

## 2019-06-22 LAB — CBC
HCT: 24.4 % — ABNORMAL LOW (ref 36.0–46.0)
HCT: 29.2 % — ABNORMAL LOW (ref 36.0–46.0)
Hemoglobin: 8.2 g/dL — ABNORMAL LOW (ref 12.0–15.0)
Hemoglobin: 9.5 g/dL — ABNORMAL LOW (ref 12.0–15.0)
MCH: 26 pg (ref 26.0–34.0)
MCH: 26 pg (ref 26.0–34.0)
MCHC: 33.6 g/dL (ref 30.0–36.0)
MCHC: 32.5 g/dL (ref 30.0–36.0)
MCV: 77.5 fL — ABNORMAL LOW (ref 80.0–100.0)
MCV: 79.8 fL — ABNORMAL LOW (ref 80.0–100.0)
Platelets: 185 10*3/uL (ref 150–400)
Platelets: 278 10*3/uL (ref 150–400)
RBC: 3.15 MIL/uL — ABNORMAL LOW (ref 3.87–5.11)
RBC: 3.66 MIL/uL — ABNORMAL LOW (ref 3.87–5.11)
RDW: 17.9 % — ABNORMAL HIGH (ref 11.5–15.5)
RDW: 18.3 % — ABNORMAL HIGH (ref 11.5–15.5)
WBC: 13.4 10*3/uL — ABNORMAL HIGH (ref 4.0–10.5)
WBC: 19.7 10*3/uL — ABNORMAL HIGH (ref 4.0–10.5)
nRBC: 0.1 % (ref 0.0–0.2)
nRBC: 0.2 % (ref 0.0–0.2)

## 2019-06-22 LAB — BASIC METABOLIC PANEL
Anion gap: 10 (ref 5–15)
Anion gap: 8 (ref 5–15)
BUN: 35 mg/dL — ABNORMAL HIGH (ref 8–23)
BUN: 34 mg/dL — ABNORMAL HIGH (ref 8–23)
CO2: 23 mmol/L (ref 22–32)
CO2: 22 mmol/L (ref 22–32)
Calcium: 7.6 mg/dL — ABNORMAL LOW (ref 8.9–10.3)
Calcium: 8.2 mg/dL — ABNORMAL LOW (ref 8.9–10.3)
Chloride: 110 mmol/L (ref 98–111)
Chloride: 107 mmol/L (ref 98–111)
Creatinine, Ser: 2.27 mg/dL — ABNORMAL HIGH (ref 0.44–1.00)
Creatinine, Ser: 2.29 mg/dL — ABNORMAL HIGH (ref 0.44–1.00)
GFR calc Af Amer: 24 mL/min — ABNORMAL LOW (ref >60)
GFR calc Af Amer: 23 mL/min — ABNORMAL LOW (ref >60)
GFR calc non Af Amer: 20 mL/min — ABNORMAL LOW (ref >60)
GFR calc non Af Amer: 20 mL/min — ABNORMAL LOW (ref >60)
Glucose, Bld: 95 mg/dL (ref 70–99)
Glucose, Bld: 170 mg/dL — ABNORMAL HIGH (ref 70–99)
Potassium: 3.3 mmol/L — ABNORMAL LOW (ref 3.5–5.1)
Potassium: 3.6 mmol/L (ref 3.5–5.1)
Sodium: 143 mmol/L (ref 135–145)
Sodium: 137 mmol/L (ref 135–145)

## 2019-06-22 MED ORDER — MORPHINE SULFATE (PF) 2 MG/ML IV SOLN
INTRAVENOUS | Status: AC
  Administered 2019-06-22: 2 mg via INTRAVENOUS
  Filled 2019-06-22: qty 1

## 2019-06-22 MED ORDER — MORPHINE SULFATE (PF) 2 MG/ML IV SOLN
1.0000 mg | INTRAVENOUS | Status: DC | PRN
  Administered 2019-06-22: 1 mg via INTRAVENOUS
  Filled 2019-06-22: qty 1

## 2019-06-22 MED ORDER — MORPHINE SULFATE (PF) 2 MG/ML IV SOLN: 2.0000 mg | Freq: Once | INTRAVENOUS | Status: AC

## 2019-06-22 MED ORDER — LORAZEPAM 2 MG/ML PO CONC: 1.0000 mg | ORAL | Status: DC | PRN

## 2019-06-22 MED ORDER — MECLIZINE HCL 25 MG PO TABS: 12.5000 mg | ORAL_TABLET | Freq: Two times a day (BID) | ORAL | Status: DC | PRN

## 2019-06-22 MED ORDER — FUROSEMIDE 10 MG/ML IJ SOLN
40.0000 mg | Freq: Once | INTRAMUSCULAR | Status: AC
  Administered 2019-06-22: 40 mg via INTRAVENOUS
  Filled 2019-06-22: qty 4

## 2019-06-22 MED ORDER — LORAZEPAM 2 MG/ML IJ SOLN
1.0000 mg | INTRAMUSCULAR | Status: DC | PRN
  Administered 2019-06-23: 1 mg via INTRAVENOUS
  Filled 2019-06-22: qty 1

## 2019-06-22 MED ORDER — FUROSEMIDE 10 MG/ML IJ SOLN: 80.0000 mg | Freq: Once | INTRAMUSCULAR | Status: AC

## 2019-06-22 MED ORDER — POTASSIUM CHLORIDE CRYS ER 20 MEQ PO TBCR: 40.0000 meq | EXTENDED_RELEASE_TABLET | Freq: Once | ORAL | Status: DC

## 2019-06-22 MED ORDER — LORAZEPAM 1 MG PO TABS: 1.0000 mg | ORAL_TABLET | ORAL | Status: DC | PRN

## 2019-06-22 MED ORDER — PROCHLORPERAZINE EDISYLATE 10 MG/2ML IJ SOLN: 10.0000 mg | Freq: Two times a day (BID) | INTRAMUSCULAR | Status: DC | PRN

## 2019-06-22 MED ORDER — MECLIZINE HCL 25 MG PO TABS
25.0000 mg | ORAL_TABLET | Freq: Once | ORAL | Status: AC
  Administered 2019-06-22: 25 mg via ORAL
  Filled 2019-06-22: qty 1

## 2019-06-22 MED ORDER — PROCHLORPERAZINE 25 MG RE SUPP: 25.0000 mg | Freq: Two times a day (BID) | RECTAL | Status: DC | PRN

## 2019-06-22 MED ORDER — FUROSEMIDE 10 MG/ML IJ SOLN
INTRAMUSCULAR | Status: AC
  Administered 2019-06-22: 80 mg via INTRAVENOUS
  Filled 2019-06-22: qty 8

## 2019-06-22 MED ORDER — PROCHLORPERAZINE MALEATE 5 MG PO TABS: 5.0000 mg | ORAL_TABLET | Freq: Four times a day (QID) | ORAL | Status: DC | PRN

## 2019-06-22 NOTE — Significant Event (Signed)
Rapid Response Event Note  Overview: Time Called: 7096 Arrival Time: 2836 Event Type: Respiratory  Initial Focused Assessment: Patient coughing up blood now in respiratory distress.  Desat to 84% on [place].  RN placed pt on NRB.  Upon my arrival patient in acute respiratory distress, using accessory muscles.  Very air hungry.  Unable to speak bc of respiratory distress.  Lung sounds with rhonchi and wheezes  Interventions: 2mg  Morphine IV PCXR done Labs drawn 80 mg Lasix IV  Dr Teryl Lucy at bedside to assess patient.  Spoke with family regarding goals of care.  Patient is a DNR/DNI.    Transitioned to comfort care.  Plan of Care (if not transferred):  Event Summary: Name of Physician Notified: Alroy Dust at 1420    at    Outcome: Code status clarified  Event End Time: 1615  Raliegh Ip

## 2019-06-22 NOTE — Progress Notes (Signed)
[facility]  PROGRESS NOTE  FABIANNA KEATS DDU:[id number] DOB: 22-Jun-1946 DOA: 06/18/2019 PCP: Josetta Huddle, MD Admit date - 06/18/2019   Admitting Physician Desiree Hane, MD  Outpatient Primary MD for the patient is Josetta Huddle, MD  LOS - 3 Brief Narrative   SAYDA GRABLE is a 73 y.o. year old female with medical history significant for CHF/NICM (EF 10 to 15%), VT s/p [id number] and biventricular PPM, chronic anemia, CKD-4, HTN, HLD, moderate protein calorie malnutrition and GIB, prolonged QT/torsade with recent hospitalization from 12/13-12/21 who presented on 06/18/2019 with lethargy, decreased eating, decreased strength, and increased confusion and was found to acute on chronic CHF exacerbation complicated by acute on chronic CKD, hypotension and hepatic encephalopathy requiring lactulose.  Hospital course complicated by persistent hypotension limiting diuresis and worsening kidney function. Daughter chose 3 hour window to see if she responds to medical treatment without escalation of care before considering comfort care given her advanced CHF, liver and kidney disease.  This am(12/26) she complained of worsening abdominal pain that seemed consistent with her being fluid overloaded. Given her BP finally being more normotensive she was given IV lasix 40 mg. Later this afternoon(12/26) she had sudden respiratory distress with cough productive of blood clots and requiring non-rebreather. CXR was consistent with pulmonary edema. Daughter was agreeable to trial of IV lasix but did not want to escalate to BiPAP or intubation. She was made DNR/DNI.  Since then on reassessment on 12/26 evening her respiratory status is still quite tenuous. I discussed with daughter who agreed to comfort care measures and deactivation of her ICD.  Given Morphine and ativan for dyspnea and anxiety.    Subjective  Feels the world is spinning And that her abdomen is tighter--12/26 am  12/26- 3pm   Short of breath Coughing up blood tinged sputum with some clots     A & P    Goals of care discussion Comfort Care Measures Due to worsening respiratory status, multiple comorbidities, advanced heart failure with minimal response to medical treatment family elected for comfort care on 12/26 -Comfort care measures -IV morphine as needed dyspnea/anxiety -IV Ativan as needed anxiety -Patient instructed to call [employer] to deactivate ICD -Visitation of family allow given end-of-life care   Hypotension, persistent.   Admitted with hypotension initially suspected to be related to overdiuresis at home.  Orthostatics remain negative here.  Had one-time episode of bright red blood, but no melena, rectal/DRE exam was within normal limits, anticoagulation was discontinued   AKI on CKD Stage 4, no significant change Likely related to CHF exacerbation.  Peak creatinine of 2.5 during admission, baseline creatinine 1.7-2. -Trial of IV Lasix 80 mg before patient was ultimately transition to comfort care measures on 12/26 -Not a candidate for advanced therapies previous heart failure evaluation by cardiology on last admission   Acute on chronic CHFrEF, NYAHA IV,  Acute respiratory failure Hemoptysis  Advanced end stage CHF, not a candidate for advanced CHF therapies based on last cardiology evaluation during previous hospitalization on 12/18.   Initial diuresis was limited due to hypotension on admission, had some output with reinitiation of home torsemide but respiratory status worsened (requiring nonrebreather, demonstrated with significant pulmonary edema) despite IV Lasix before transitioning to comfort care on 12/26 afternoon -Status post IV Lasix 80 mg trial with minimal improvement --family does not want to escalate current medical treatment so will not consult cardiology -Patient is now DNR/DNI, comfort care measures, nurse will contact [employer] to deactivate ICD  Mild troponin  elevation  Troponin of 36--59.  Nonischemic EKG  Cirrhosis with small volume ascites Currently has abdominal distention, likely from CHF. Abd xr with small volume ascites. Not enough for paracentesis.    Ammonia slightly up at 50. Alert and oriented x4 -DC lactulose given comfort care measures   Nausea Likely related to abdominal distension from chf exacerbation. Still tolerating PO, no emesis On multiple qtc prolonging agents. Last Qtc 520s -start PO tigan prn q6 H, with IV compazine for breakthrough   GERD, stable -continue Protonix  Paroxysmal atrial fibrillation, currently rate controlled History of ventricular tachycardia status post AICD in 2014 a biventricular pacemaker.   No arrhythmias on review of telemetry -Deactivate ICD given comfort care measures -Continue amiodarone, mexiletine  --holding  Eliquis (given prior bleeding episode during hospitalization mentioned below) -monitor on telemetry  Hyperlipidemia, stable -DC atorvastatin  Chronic microcytic anemia,  Consistent with iron deficiency anemia based on most recent iron panel. No current melena, no BRBPR, reported bright red blood per rectum during his hospital stay, but DRE was normal limits on 12/24, did receive 1 packed red blood cell transfusion on 12/25 at that time home Eliquis was discontinued.  History of multiple endoscopies to evaluate anemia/melena recently evaluation by GI (11/30) during last hospitalization did not recommend endoscopic evaluation at the time given no overt bleeding -Comfort care measures  Reported vagina bleeding with noted clot -Comfort care measures  Moderate protein calorie malnutrition -Comfort care measures  Debility PT recommended SNF Patient reports bad experience with [facility] in the past Expect in hospital demise most likely -Continue comfort care measures -If clinically remains stable will need social work consultation for hospice most likely    Family  Communication  : Spoke with daughter [name] and her son at [phone] on 12/26 given very weak heart, significant respiratory distress, worsening kidney function, cirrhosis daughter elected for comfort care measures.  Agrees with deactivation of ICD.     Code Status : DNR  Disposition Plan  : Comfort care measures/end-of-life care, expect in-hospital demise.   Consults  : Palliative  Procedures  : None  DVT Prophylaxis  : SCDs, eliquis d/c'd due to bleeding  Lab Results  Component Value Date   PLT 185 06/22/2019    Diet :  Diet Order            Diet Heart Room service appropriate? Yes with Assist; Fluid consistency: Thin; Fluid restriction: 1800 mL Fluid  Diet effective now               Inpatient Medications Scheduled Meds: . amiodarone  200 mg Oral BID  . atorvastatin  40 mg Oral q1800  . carvedilol  3.125 mg Oral BID WC  . ferrous gluconate  324 mg Oral BID  . furosemide  40 mg Intravenous Once  . lactulose  20 g Oral TID  . magnesium oxide  400 mg Oral q morning - 10a  . mexiletine  200 mg Oral BID  . pantoprazole  40 mg Oral Daily  . pneumococcal 23 valent vaccine  0.5 mL Intramuscular Tomorrow-1000   Continuous Infusions:  PRN Meds:.acetaminophen, prochlorperazine, trimethobenzamide  Antibiotics  :   Anti-infectives (From admission, onward)   None       Objective   Vitals:   06/21/19 1215 06/21/19 2048 06/22/19 0542 06/22/19 0930  BP: 119/62 (!) 102/47 (!) 110/59 (!) 127/56  Pulse: 62 81 (!) 59 60  Resp: 19 17 15    Temp:  97.9 F (36.6 C) 97.9 F (36.6 C)   TempSrc:  Axillary Oral   SpO2: 92%  100%   Weight:   57.8 kg   Height:        SpO2: 100 % O2 Flow Rate (L/min): 2 L/min  Wt Readings from Last 3 Encounters:  06/22/19 57.8 kg  06/17/19 57.3 kg  06/07/19 61.9 kg    No intake or output data in the 24 hours ending 06/22/19 1021  Physical Exam:  Awake Alert, oriented to self, place, time Slow speech but follows all commands  with no appreciable deficits [place].AT, No appreciable JVD Symmetrical Chest wall movement, Good air movement bilaterally on room air, no respiratory distress--- addendum 12/26 3 PM on nonrebreather with use of accessory muscles, increased work of breathing RRR,No Gallops,Rubs or new Murmurs,  +ve B.Sounds, Abd Soft distended(increased than prior exam) with no tenderness, No rebound, guarding or rigidity. Scant non-pitting lower extremity edema   I have personally reviewed the following:   Data Reviewed:  CBC Recent Labs  Lab 06/16/19 0401 06/18/19 1300 06/19/19 0541 06/20/19 0405 06/21/19 0104 06/22/19 0459  WBC 15.7* 16.3* 16.8* 12.0* 12.1* 13.4*  HGB 7.7* 8.5* 7.8* 7.3* 9.3* 8.2*  HCT 23.5* 25.9* 23.9* 21.8* 27.5* 24.4*  PLT 259 246 248 208 232 185  MCV 76.3* 77.1* 76.8* 76.5* 77.0* 77.5*  MCH 25.0* 25.3* 25.1* 25.6* 26.1 26.0  MCHC 32.8 32.8 32.6 33.5 33.8 33.6  RDW 17.5* 17.6* 17.8* 18.0* 17.4* 17.9*  LYMPHSABS 1.8 1.7  --   --   --   --   MONOABS 1.4* 1.5*  --   --   --   --   EOSABS 0.2 0.2  --   --   --   --   BASOSABS 0.1 0.1  --   --   --   --     Chemistries  Recent Labs  Lab 06/16/19 0401 06/16/19 1436 06/18/19 1300 06/19/19 0541 06/20/19 0405 06/21/19 0104 06/22/19 0459  NA 140  --  135 138 138 139 143  K 3.8  --  4.0 3.9 3.5 3.5 3.3*  CL 103  --  99 103 101 107 110  CO2 27  --  26 24 26  21* 23  GLUCOSE 102*  --  112* 78 90 103* 95  BUN 28*  --  37* 37* 40* 39* 35*  CREATININE 1.96*  --  2.56* 2.48* 2.52* 2.29* 2.27*  CALCIUM 8.1*  --  8.3* 8.2* 8.1* 8.1* 7.6*  MG  --  2.0  --   --   --   --   --   AST 63*  --  69* 60*  --   --   --   ALT 30  --  29 28  --   --   --   ALKPHOS 112  --  108 104  --   --   --   BILITOT 0.9  --  1.6* 1.6*  --   --   --    ------------------------------------------------------------------------------------------------------------------ No results for input(s): CHOL, HDL, LDLCALC, TRIG, CHOLHDL, LDLDIRECT in the last 72  hours.  Lab Results  Component Value Date   HGBA1C 5.6 05/31/2019   ------------------------------------------------------------------------------------------------------------------ Recent Labs    06/19/19 1904  TSH 3.669   ------------------------------------------------------------------------------------------------------------------ No results for input(s): VITAMINB12, FOLATE, FERRITIN, TIBC, IRON, RETICCTPCT in the last 72 hours.  Coagulation profile No results for input(s): INR, PROTIME in the last 168 hours.  No results for  input(s): DDIMER in the last 72 hours.  Cardiac Enzymes No results for input(s): CKMB, TROPONINI, MYOGLOBIN in the last 168 hours.  Invalid input(s): CK ------------------------------------------------------------------------------------------------------------------    Component Value Date/Time   BNP 1,324.0 (H) 06/19/2019 1634   BNP 367.0 (H) 03/16/2016 1457    Micro Results Recent Results (from the past 240 hour(s))  SARS CORONAVIRUS 2 (TAT 6-24 HRS) Nasopharyngeal Nasopharyngeal Swab     Status: None   Collection Time: 06/18/19  5:58 PM   Specimen: Nasopharyngeal Swab  Result Value Ref Range Status   SARS Coronavirus 2 NEGATIVE NEGATIVE Final    Comment: (NOTE) SARS-CoV-2 target nucleic acids are NOT DETECTED. The SARS-CoV-2 RNA is generally detectable in upper and lower respiratory specimens during the acute phase of infection. Negative results do not preclude SARS-CoV-2 infection, do not rule out co-infections with other pathogens, and should not be used as the sole basis for treatment or other patient management decisions. Negative results must be combined with clinical observations, patient history, and epidemiological information. The expected result is Negative. Fact Sheet for Patients: SugarRoll.be Fact Sheet for Healthcare Providers: https://www.woods-mathews.com/ This test is not yet  approved or cleared by the [place] FDA and  has been authorized for detection and/or diagnosis of SARS-CoV-2 by FDA under an Emergency Use Authorization (EUA). This EUA will remain  in effect (meaning this test can be used) for the duration of the COVID-19 declaration under Section 56 4(b)(1) of the Act, 21 U.S.C. section 360bbb-3(b)(1), unless the authorization is terminated or revoked sooner. Performed at [facility], [place] [address] [place], [place] [address]     Radiology Reports CT ABDOMEN PELVIS WO CONTRAST  Result Date: 06/14/2019 CLINICAL DATA:  Acute generalized abdominal pain. EXAM: CT ABDOMEN AND PELVIS WITHOUT CONTRAST TECHNIQUE: Multidetector CT imaging of the abdomen and pelvis was performed following the standard protocol without IV contrast. COMPARISON:  06/01/2019 FINDINGS: Lower chest: Mild bibasilar atelectasis. Hepatobiliary: Findings suspicious for hepatic cirrhosis are again demonstrated. No liver masses identified on this unenhanced exam. Gallbladder is unremarkable. No evidence of biliary ductal dilatation. Pancreas: No mass or inflammatory process visualized on this unenhanced exam. Spleen:  Within normal limits in size. Adrenals/Urinary tract: Stable 3.5 cm low-attenuation right adrenal mass, consistent with benign adenoma. A few tiny less than 5 mm renal calculi are again seen bilaterally, however there is no evidence of ureteral calculi or hydronephrosis. Unremarkable unopacified urinary bladder. Stomach/Bowel: No evidence of obstruction, inflammatory process, or abnormal fluid collections. Diverticulosis is seen mainly involving the descending and sigmoid colon, however there is no evidence of diverticulitis. Vascular/Lymphatic: No pathologically enlarged lymph nodes identified. No evidence of abdominal aortic aneurysm. Aortic atherosclerosis incidentally noted. Reproductive: Small uterine fibroids some of which are calcified are again noted. Adnexal regions  are unremarkable. Other: Diffuse mesenteric and body wall edema, and mild ascites, have mildly increased since previous study. Musculoskeletal:  No suspicious bone lesions identified. IMPRESSION: Mild increase in mild ascites and diffuse mesenteric and body wall edema since prior study. No focal inflammatory process or abscess identified. Probable hepatic cirrhosis. Colonic diverticulosis, without radiographic evidence of diverticulitis. Bilateral nephrolithiasis. No evidence of ureteral calculi or hydronephrosis. Stable small uterine fibroids. Stable benign right adrenal adenoma. Aortic Atherosclerosis (ICD10-I70.0). Electronically Signed   By: Marlaine Hind M.D.   On: 06/14/2019 18:21   CT ABDOMEN PELVIS WO CONTRAST  Result Date: 06/01/2019 CLINICAL DATA:  Abdominal pain.  Concern for diverticulitis. EXAM: CT ABDOMEN AND PELVIS WITHOUT CONTRAST TECHNIQUE: Multidetector CT  imaging of the abdomen and pelvis was performed following the standard protocol without IV contrast. COMPARISON:  05/26/2019 FINDINGS: The lack of intravenous contrast limits the ability to evaluate solid abdominal organs. Lower chest: Limited visualization of lower thorax demonstrates minimal bibasilar heterogeneous opacities, left greater than right, improved compared to the 11/29 examination. No pleural effusion. Cardiomegaly. Pacer leads again terminate within the right atrium and ventricle. No pericardial effusion. Hepatobiliary: Nodularity hepatic contour. There is diffuse decreased attempt increased attenuation of the hepatic parenchyma as could be seen in the setting of chronic amiodarone therapy. Trace amount of perihepatic ascites, unchanged to slightly progressed compared to the 05/26/2019 examination. Normal noncontrast appearance of the gallbladder. No radiopaque gallstones. Pancreas: Normal noncontrast appearance of the pancreas. Spleen: Normal noncontrast appearance of the spleen. Adrenals/Urinary Tract: The bilateral kidneys  again appear atrophic. Punctate nonobstructing renal stones are again seen bilaterally with dominant nonobstructing stone within the inferior pole the right kidney measuring approximately 4 mm in diameter (image 38, series 3) and dominant nonobstructing stone within the inferior pole the left kidney measuring approximately 2 mm (image 36, series 3). No renal stones are seen along the expected course of either ureter. Excreted contrast from previous contrast enhance head and neck CTA is seen within the urinary bladder. No urinary obstruction or perinephric stranding. Redemonstrated approximately 3.8 x 1.9 cm hypoattenuating right-sided adrenal adenoma. Normal noncontrast appearance of the left adrenal gland. Stomach/Bowel: Ingested enteric contrast extends to the level of the hepatic flexure of the colon. Redemonstrated small right-sided inguinal hernia which is noted to contain a short-segment of nondilated small bowel (representative coronal images 39 through 63, series 6), not resulting in enteric obstruction. Moderate colonic stool burden. Scattered colonic diverticulosis without evidence of superimposed acute diverticulitis. Normal appearance of the terminal ileum. The appendix is not visualized, however there is no definitive pericecal inflammatory change on this noncontrast examination. No pneumoperitoneum, pneumatosis or portal venous gas. Vascular/Lymphatic: Atherosclerotic plaque within normal caliber abdominal aorta. No bulky retroperitoneal, mesenteric, pelvic or inguinal lymphadenopathy. Reproductive: Dystrophic calcifications, presumed degenerating fibroids within and expectedly atrophic uterus. No discrete adnexal lesion. Trace amount of fluid in the pelvic cul-de-sac. Other: Moderate amount of diffuse body wall anasarca potentially secondary to third spacing. Musculoskeletal: No acute or aggressive osseous abnormalities. Grade 1 anterolisthesis of L4 upon L5 and L5 upon S1 without associated pars  defects. A bone island is noted within the left acetabulum. IMPRESSION: 1. No explanation for patient's abdominal pain. Specifically, no evidence enteric obstruction or diverticulitis on this noncontrast examination. 2. Redemonstrated right inguinal hernia containing nondilated loops of small bowel and not resulting in enteric obstruction. 3. Nonobstructive bilateral nephrolithiasis unchanged. 4. Cardiomegaly with diffuse body wall anasarca as could be seen in the setting early CHF. Clinical correlation is advised. 5. Suspected early cirrhotic change with minimal amount perihepatic ascites. 6.  Aortic Atherosclerosis (ICD10-I70.0). Electronically Signed   By: Sandi Mariscal M.D.   On: 06/01/2019 12:08   CT ABDOMEN PELVIS WO CONTRAST  Result Date: 05/26/2019 CLINICAL DATA:  73 year old female with history of abdominal distension. Diffuse abdominal pain, most severe on the right side. EXAM: CT ABDOMEN AND PELVIS WITHOUT CONTRAST TECHNIQUE: Multidetector CT imaging of the abdomen and pelvis was performed following the standard protocol without IV contrast. COMPARISON:  CT the abdomen and pelvis 03/17/2019. FINDINGS: Lower chest: Mild scarring in the visualized lung bases. Cardiomegaly. Atherosclerotic calcifications in the left anterior descending, left circumflex and right coronary arteries. Pacemaker leads terminating in the right atrium and right  ventricular apex. Hepatobiliary: Liver has a slightly shrunken appearance and nodular contour, suggesting underlying cirrhosis. No definite suspicious cystic or solid hepatic lesions are confidently identified on today's noncontrast CT examination. Unenhanced appearance of the gallbladder is normal. Pancreas: No definite pancreatic mass or peripancreatic fluid collections or inflammatory changes are noted on today's noncontrast CT examination. Spleen: Unremarkable. Adrenals/Urinary Tract: Multiple nonobstructive calculi are noted within the collecting systems of both  kidneys, largest of which measures up to 4 mm in the lower pole collecting system of the right kidney. No additional calculi are confidently identified along the course of either ureter or within the lumen of the urinary bladder. No hydroureteronephrosis. Unenhanced appearance of the kidneys is otherwise normal. Left adrenal gland is normal in appearance. 4.5 x 2.1 cm low-attenuation (-27 HU) right adrenal nodule, similar to prior studies, compatible with an adrenal myelolipoma. Unenhanced appearance of the urinary bladder is normal. Stomach/Bowel: Unenhanced appearance of the stomach is normal. No pathologic dilatation of small bowel or colon. Multiple loops of small bowel appear to extend into a large right inguinal hernia. The appendix is not confidently identified and may be surgically absent. Regardless, there are no inflammatory changes noted adjacent to the cecum to suggest the presence of an acute appendicitis at this time. Numerous colonic diverticulae are noted, without surrounding inflammatory changes to suggest an acute diverticulitis at this time. Vascular/Lymphatic: Aortic atherosclerosis. No lymphadenopathy noted in the abdomen or pelvis confidently identified on today's noncontrast CT examination. Reproductive: Uterus is markedly heterogeneous in appearance with multiple coarse calcifications, compatible with a fibroid uterus. Ovaries are unremarkable in appearance. Other: Trace volume of ascites.  No pneumoperitoneum. Musculoskeletal: There are no aggressive appearing lytic or blastic lesions noted in the visualized portions of the skeleton. IMPRESSION: 1. Large right inguinal hernia containing several loops of small bowel, without evidence of bowel incarceration or obstruction. 2. Numerous small nonobstructive calculi are noted in the collecting systems of both kidneys measuring up to 4 mm in the lower pole. No ureteral stones or findings of urinary tract obstruction are noted at this time. 3.  Colonic diverticulosis without evidence of acute diverticulitis at this time. 4. Severe cardiomegaly. 5. Aortic atherosclerosis, in addition to least 3 vessel coronary artery disease. Assessment for potential risk factor modification, dietary therapy or pharmacologic therapy may be warranted, if clinically indicated. 6. Morphologic changes in the liver suggestive of underlying cirrhosis. 7. Additional incidental findings, as above. Electronically Signed   By: Vinnie Langton M.D.   On: 05/26/2019 18:27   CT ANGIO HEAD W OR WO CONTRAST  Result Date: 05/31/2019 CLINICAL DATA:  Left-sided weakness EXAM: CT ANGIOGRAPHY HEAD AND NECK TECHNIQUE: Multidetector CT imaging of the head and neck was performed using the standard protocol during bolus administration of intravenous contrast. Multiplanar CT image reconstructions and MIPs were obtained to evaluate the vascular anatomy. Carotid stenosis measurements (when applicable) are obtained utilizing NASCET criteria, using the distal internal carotid diameter as the denominator. CONTRAST:  162mL OMNIPAQUE IOHEXOL 350 MG/ML SOLN COMPARISON:  None. FINDINGS: CTA NECK FINDINGS Aortic arch: Great vessel origins are patent. Right carotid system: Common, internal, and external carotid arteries are patent. There is mild calcified plaque along the common carotid. Mild calcified plaque is present at the ICA origin, noting streak artifact through this region. There is no hemodynamically significant stenosis or evidence of dissection. Left carotid system: Common, internal, and external carotid arteries are patent. Mild calcified plaque is present along the common carotid. There is primarily calcified plaque  at the bifurcation and ICA origin causing minimal stenosis, noting streak artifact through this region. There is no hemodynamically significant stenosis or evidence of dissection. Vertebral arteries: Dominant right vertebral artery is patent. Calcified plaque at its origin  results in mild stenosis. Congenitally small caliber left vertebral artery is patent. Skeleton: Advanced degenerative changes of the cervical spine particularly from C4-C5 through C6-C7. Other neck: No neck mass or adenopathy. Diffuse paranasal sinus opacification with areas of hyperdensity likely reflecting inspissation with fungal colonization not excluded. Upper chest: No apical lung mass. Review of the MIP images confirms the above findings CTA HEAD FINDINGS Anterior circulation: Intracranial internal carotid arteries are patent with calcified plaque along cavernous and paraclinoid portions causing mild stenosis. Anterior and middle cerebral arteries are patent. Posterior circulation: Intracranial vertebral arteries are patent. The left vertebral artery terminates as a PICA. Basilar artery is patent. Posterior cerebral arteries are patent. Posterior communicating arteries are present. Venous sinuses: Patent as permitted by contrast timing Anatomic variants: Fetal origin of the right PCA. Review of the MIP images confirms the above findings IMPRESSION: No proximal intracranial vessel occlusion. No occlusion or hemodynamically significant stenosis in the neck. Electronically Signed   By: Macy Mis M.D.   On: 05/31/2019 15:56   DG Chest 2 View  Result Date: 05/26/2019 CLINICAL DATA:  Patient with right-sided abdominal pain. History of CHF. EXAM: CHEST - 2 VIEW COMPARISON:  Chest radiograph 02/20/2019 FINDINGS: Multi lead AICD device overlies the left hemithorax. Leads are stable in position. Stable cardiomegaly. Tortuosity of the thoracic aorta. Heterogeneous opacities left lung base. Pulmonary vascular redistribution. Trace bilateral pleural effusions. Thoracic spine degenerative changes. IMPRESSION: Cardiomegaly with pulmonary vascular redistribution and mild interstitial edema. Heterogeneous opacities left lung base may represent atelectasis. Probable trace bilateral pleural effusions. Electronically  Signed   By: Lovey Newcomer M.D.   On: 05/26/2019 20:01   CT Head Wo Contrast  Result Date: 06/18/2019 CLINICAL DATA:  Lethargic and not eating EXAM: CT HEAD WITHOUT CONTRAST TECHNIQUE: Contiguous axial images were obtained from the base of the skull through the vertex without intravenous contrast. COMPARISON:  06/04/2019 FINDINGS: Brain: There is no acute intracranial hemorrhage, mass-effect, or edema. Gray-white differentiation is preserved. There is no extra-axial fluid collection. Ventricles and sulci are within normal limits in size and configuration. Patchy hypoattenuation in the supratentorial white matter is nonspecific but likely reflects stable chronic microvascular ischemic changes. Vascular: There is atherosclerotic calcification at the skull base. Skull: Calvarium is unremarkable. Sinuses/Orbits: Persistent paranasal sinus opacification with areas of high density likely reflecting inspissation. Orbits are unremarkable. Other: None. IMPRESSION: No acute intracranial abnormality. Stable chronic findings detailed above. Electronically Signed   By: Macy Mis M.D.   On: 06/18/2019 13:45   CT HEAD WO CONTRAST  Result Date: 06/04/2019 CLINICAL DATA:  Left-sided weakness EXAM: CT HEAD WITHOUT CONTRAST TECHNIQUE: Contiguous axial images were obtained from the base of the skull through the vertex without intravenous contrast. COMPARISON:  06/01/2019 FINDINGS: Brain: There is no acute intracranial hemorrhage, mass-effect, or edema. There is no new loss of gray-white differentiation. Patchy hypoattenuation in the supratentorial white matter is nonspecific but likely reflects stable chronic microvascular ischemic changes. There is no extra-axial fluid collection. Ventricles and sulci are stable in size and configuration. Vascular: There is atherosclerotic calcification at the skull base. Skull: Calvarium is unremarkable. Sinuses/Orbits: Persistent diffuse polypoid mucosal thickening with areas of  increased density likely reflecting inspissation. Other: None. IMPRESSION: No significant change since 06/01/2019. Electronically Signed   By:  Macy Mis M.D.   On: 06/04/2019 14:21   CT HEAD WO CONTRAST  Result Date: 06/01/2019 CLINICAL DATA:  Encephalopathy. Follow-up for question of left basal ganglia region stroke. EXAM: CT HEAD WITHOUT CONTRAST TECHNIQUE: Contiguous axial images were obtained from the base of the skull through the vertex without intravenous contrast. COMPARISON:  CT 05/31/2019, 05/30/2019 and 04/15/2019. FINDINGS: Brain: No acute finding. The brainstem and cerebellum appear normal. Cerebral hemispheres show mild chronic small-vessel ischemic change of the white matter. This includes an area of low-density in the anterior limb internal capsule on the left which was present in October in therefore not acute. No sign of acute infarction, mass lesion, hemorrhage, hydrocephalus or extra-axial collection. Vascular: There is atherosclerotic calcification of the major vessels at the base of the brain. Skull: Negative Sinuses/Orbits: Widespread sinus opacification consistent with rhinosinusitis. Orbits negative. Other: None IMPRESSION: No acute intracranial finding. Chronic small-vessel ischemic changes of the white matter. Low-density in the anterior limb internal capsule on the left question as acute on the recent study was, I think, present on 04/15/2019 and therefore not acute. Widespread rhinosinusitis. Electronically Signed   By: Nelson Chimes M.D.   On: 06/01/2019 11:12   CT HEAD WO CONTRAST  Result Date: 05/30/2019 CLINICAL DATA:  Vertigo EXAM: CT HEAD WITHOUT CONTRAST TECHNIQUE: Contiguous axial images were obtained from the base of the skull through the vertex without intravenous contrast. COMPARISON:  04/15/2019 FINDINGS: Brain: No acute intracranial abnormality. Specifically, no hemorrhage, hydrocephalus, mass lesion, acute infarction, or significant intracranial injury. Vascular:  No hyperdense vessel or unexpected calcification. Skull: No acute calvarial abnormality. Sinuses/Orbits: Diffuse disease throughout the paranasal sinuses with extensive mucosal thickening. Near complete opacification of the maxillary, frontal and sphenoid sinuses. Mastoid air cells clear. Other: None IMPRESSION: No acute intracranial abnormality. Severe chronic sinusitis. Electronically Signed   By: Rolm Baptise M.D.   On: 05/30/2019 21:40   CT ANGIO NECK W OR WO CONTRAST  Result Date: 05/31/2019 CLINICAL DATA:  Left-sided weakness EXAM: CT ANGIOGRAPHY HEAD AND NECK TECHNIQUE: Multidetector CT imaging of the head and neck was performed using the standard protocol during bolus administration of intravenous contrast. Multiplanar CT image reconstructions and MIPs were obtained to evaluate the vascular anatomy. Carotid stenosis measurements (when applicable) are obtained utilizing NASCET criteria, using the distal internal carotid diameter as the denominator. CONTRAST:  147mL OMNIPAQUE IOHEXOL 350 MG/ML SOLN COMPARISON:  None. FINDINGS: CTA NECK FINDINGS Aortic arch: Great vessel origins are patent. Right carotid system: Common, internal, and external carotid arteries are patent. There is mild calcified plaque along the common carotid. Mild calcified plaque is present at the ICA origin, noting streak artifact through this region. There is no hemodynamically significant stenosis or evidence of dissection. Left carotid system: Common, internal, and external carotid arteries are patent. Mild calcified plaque is present along the common carotid. There is primarily calcified plaque at the bifurcation and ICA origin causing minimal stenosis, noting streak artifact through this region. There is no hemodynamically significant stenosis or evidence of dissection. Vertebral arteries: Dominant right vertebral artery is patent. Calcified plaque at its origin results in mild stenosis. Congenitally small caliber left vertebral  artery is patent. Skeleton: Advanced degenerative changes of the cervical spine particularly from C4-C5 through C6-C7. Other neck: No neck mass or adenopathy. Diffuse paranasal sinus opacification with areas of hyperdensity likely reflecting inspissation with fungal colonization not excluded. Upper chest: No apical lung mass. Review of the MIP images confirms the above findings CTA HEAD FINDINGS Anterior  circulation: Intracranial internal carotid arteries are patent with calcified plaque along cavernous and paraclinoid portions causing mild stenosis. Anterior and middle cerebral arteries are patent. Posterior circulation: Intracranial vertebral arteries are patent. The left vertebral artery terminates as a PICA. Basilar artery is patent. Posterior cerebral arteries are patent. Posterior communicating arteries are present. Venous sinuses: Patent as permitted by contrast timing Anatomic variants: Fetal origin of the right PCA. Review of the MIP images confirms the above findings IMPRESSION: No proximal intracranial vessel occlusion. No occlusion or hemodynamically significant stenosis in the neck. Electronically Signed   By: Macy Mis M.D.   On: 05/31/2019 15:56   CARDIAC CATHETERIZATION  Result Date: 05/30/2019 Findings: RA = 8 RV = 58/12 PA = 67/13 (41) PCW = 24 Fick cardiac output/index = 6.4/4.0 PVR = 2.6 WU Ao sat = 99% PA sat = 67%, 68% High SVC sat = 69% Assessment: 1. Moderately elevated filling pressures with normal cardiac output Plan/Discussion: Continue IV diuresis. Given normal cardiac output would likely not benefit from inotropic support. Glori Bickers, MD 8:49 AM  US Abdomen Limited  Result Date: 06/19/2019 CLINICAL DATA:  Cirrhosis.  Abdominal distention. EXAM: LIMITED ABDOMEN ULTRASOUND FOR ASCITES TECHNIQUE: Limited ultrasound survey for ascites was performed in all four abdominal quadrants. COMPARISON:  Ultrasound 06/05/2019.  CT 06/14/2019. FINDINGS: A small amount of ascites is  noted, greatest in the left upper quadrant. Overall volume appears similar to the recent previous studies. IMPRESSION: Small amount of ascites, similar to recent prior studies. Electronically Signed   By: Richardean Sale M.D.   On: 06/19/2019 18:22   US Abdomen Limited  Result Date: 06/05/2019 CLINICAL DATA:  Ascites EXAM: LIMITED ABDOMEN ULTRASOUND FOR ASCITES TECHNIQUE: Limited ultrasound survey for ascites was performed in all four abdominal quadrants. COMPARISON:  CT abdomen pelvis 06/01/2019 FINDINGS: Small volume ascites identified in the 4 quadrants. IMPRESSION: Scattered small volume ascites. Electronically Signed   By: Lavonia Dana M.D.   On: 06/05/2019 13:15   DG Chest Portable 1 View  Result Date: 06/18/2019 CLINICAL DATA:  Discharge from the hospital yesterday. Family reports patient is lethargic and not eating. Slow to respond. EXAM: PORTABLE CHEST 1 VIEW COMPARISON:  06/09/2019. FINDINGS: Enlarged cardiopericardial silhouette, stable from the prior study. Stable left anterior chest wall AICD. No mediastinal or hilar masses. Prominent bronchovascular markings most evident in the bases. No evidence of pneumonia or pulmonary edema. No pleural effusion or pneumothorax. Skeletal structures are grossly intact. IMPRESSION: No acute cardiopulmonary disease. Electronically Signed   By: Lajean Manes M.D.   On: 06/18/2019 14:14   DG Chest Port 1 View  Result Date: 06/09/2019 CLINICAL DATA:  Pt unable to answer hx questions. Only moaned when Tech questioned her. Per nurse: Pt from [facility]. Reports difficulty breathing since 1100 this AM. Lethargic and SOB. 100% on RA, wheezing heard. 5 mg or albuterol given en route. EXAM: PORTABLE CHEST 1 VIEW COMPARISON:  Chest radiograph 06/05/2019 FINDINGS: Stable cardiomediastinal contours with enlarged heart size. Left ICD in place. Central pulmonary vascular congestion. Diffuse mild bilateral interstitial opacities likely reflecting trace edema. No new  focal consolidation. No pneumothorax or large pleural effusion. No acute finding in the visualized skeleton. IMPRESSION: Cardiomegaly with central pulmonary vascular congestion and mild bilateral interstitial opacities likely reflecting edema. Electronically Signed   By: Audie Pinto M.D.   On: 06/09/2019 15:59   DG CHEST PORT 1 VIEW  Result Date: 06/05/2019 CLINICAL DATA:  Congestive heart failure. EXAM: PORTABLE CHEST 1 VIEW COMPARISON:  Single-view of the chest 05/31/2019. PA and lateral chest 05/26/2019. FINDINGS: There is cardiomegaly without edema. No consolidative process, pneumothorax or effusion. AICD is in place. IMPRESSION: Cardiomegaly without acute disease. Electronically Signed   By: Inge Rise M.D.   On: 06/05/2019 13:23   DG CHEST PORT 1 VIEW  Result Date: 05/31/2019 CLINICAL DATA:  Cough.  CHF. EXAM: PORTABLE CHEST 1 VIEW COMPARISON:  05/26/2019 FINDINGS: There is a left chest wall ICD with leads in the right atrial appendage and right ventricle. Cardiac enlargement. Aortic atherosclerosis. Pulmonary vascular congestion. No pleural effusion. No airspace opacities. IMPRESSION: 1. Similar appearance of cardiac enlargement and pulmonary vascular congestion. Electronically Signed   By: Kerby Moors M.D.   On: 05/31/2019 19:42   EEG adult  Result Date: 06/07/2019 Lora Havens, MD     06/07/2019 10:45 AM Patient Name: AVIN UPPERMAN MRN: [id number] Epilepsy Attending: Lora Havens Referring Physician/Provider: Dr Darliss Cheney Date: 06/07/2019 Duration: 22.43mins Patient history: 73yo M with sudden onset of slurred speech and left sided weakness. EEG to evaluate for seizure Level of alertness: awake AEDs during EEG study: None Technical aspects: This EEG study was done with scalp electrodes positioned according to the 10-20 International system of electrode placement. Electrical activity was acquired at a sampling rate of 500Hz  and reviewed with a high frequency filter of  70Hz  and a low frequency filter of 1Hz . EEG data were recorded continuously and digitally stored. DESCRIPTION: No clear posterior dominant rhythm was seen. EEG showed continuous generalized polymorphic 3-6hz  theta-delta slowing. Sharp transient was seen in right frontotemporal region. Hyperventilation and photic stimulation were not performed.  ABNORMALITY - Continuous slow, generalized  IMPRESSION: This study is suggestive of moderate diffuse encephalopathy, non specific to etiology. No seizures or definite epileptiform discharges were seen throughout the recording. If suspicion for interictal activity remains high, consider long term eeg monitoring. Lora Havens    EEG adult  Result Date: 05/31/2019 Lora Havens, MD     05/31/2019  4:41 PM Patient Name: FRANKLIN CLAPSADDLE MRN: [id number] Epilepsy Attending: Lora Havens Referring Physician/Provider: Dr Rosalin Hawking Date: 05/31/2019 Duration: 26.27mins Patient history: 73yo M with sudden onset of slurred speech and left sided weakness. EEG to evaluate for seizure Level of alertness: awake AEDs during EEG study: none Technical aspects: This EEG study was done with scalp electrodes positioned according to the 10-20 International system of electrode placement. Electrical activity was acquired at a sampling rate of 500Hz  and reviewed with a high frequency filter of 70Hz  and a low frequency filter of 1Hz . EEG data were recorded continuously and digitally stored. DESCRIPTION: No clear posterior dominant rhythm was seen. EEG showed continuous generalized polymorphic 3-6hz  theta-delta slowing. At times, generalized triphasic waves were also noted. Hyperventilation and photic stimulation were not performed. ABNORMALITY - Continuous slow, generalized - Triphasic waves, generalized IMPRESSION: This study is suggestive of moderate diffuse encephalopathy, non specific to etiology, could be secondary to toxic-metabolic causes. No seizures or definite epileptiform  discharges were seen throughout the recording. Priyanka Barbra Sarks   CT HEAD CODE STROKE WO CONTRAST`  Result Date: 05/31/2019 CLINICAL DATA:  Code stroke.  Left-sided weakness EXAM: CT HEAD WITHOUT CONTRAST TECHNIQUE: Contiguous axial images were obtained from the base of the skull through the vertex without intravenous contrast. COMPARISON:  CT head 04/15/2019 FINDINGS: Brain: Ventricle size normal. Small hypodensity anterior limb internal capsule on the left unchanged from the prior study. Negative for acute infarct, hemorrhage, mass. No midline shift.  Vascular: Negative for hyperdense vessel Skull: Negative Sinuses/Orbits: Extensive mucosal edema throughout the paranasal sinuses. Negative orbit. Other: None ASPECTS ([place] Stroke Program Early CT Score) - Ganglionic level infarction (caudate, lentiform nuclei, internal capsule, insula, M1-M3 cortex): 7 - Supraganglionic infarction (M4-M6 cortex): 3 Total score (0-10 with 10 being normal): 10 IMPRESSION: 1. No acute abnormality and no change from the prior study 2. ASPECTS is 10 Electronically Signed   By: Franchot Gallo M.D.   On: 05/31/2019 15:03     Time Spent in minutes  30     Desiree Hane M.D on 06/22/2019 at 10:21 AM  To page go to [email or web address] - password [facility]

## 2019-06-22 NOTE — Progress Notes (Signed)
Patient called to desk stated she was choking, upon arrival in room noted she was coughing up blood with some small clots in resp distress with labored respirations.  O2 sat 86% on 2L/min [place]. B/P 169/89. Rapid response paged and Dr. Lonny Prude.  O2 sat continued to drop down to 84%, non-rebreather mask applied. O2 sat 100% on non-rebreather. Dr. Lonny Prude and Rapid nurse nurse in room at 2:30pm.

## 2019-06-23 DIAGNOSIS — I472 Ventricular tachycardia: Secondary | ICD-10-CM

## 2019-06-23 MED ORDER — PANTOPRAZOLE SODIUM 40 MG PO TBEC
40.0000 mg | DELAYED_RELEASE_TABLET | Freq: Every day | ORAL | Status: DC
  Administered 2019-06-24 – 2019-06-25 (×2): 40 mg via ORAL
  Filled 2019-06-23 (×2): qty 1

## 2019-06-23 MED ORDER — CARVEDILOL 3.125 MG PO TABS
3.1250 mg | ORAL_TABLET | Freq: Two times a day (BID) | ORAL | Status: DC
  Administered 2019-06-24: 3.125 mg via ORAL
  Filled 2019-06-23 (×4): qty 1

## 2019-06-23 MED ORDER — ATORVASTATIN CALCIUM 40 MG PO TABS
40.0000 mg | ORAL_TABLET | Freq: Every day | ORAL | Status: DC
  Administered 2019-06-24: 40 mg via ORAL
  Filled 2019-06-23 (×3): qty 1

## 2019-06-23 MED ORDER — FUROSEMIDE 10 MG/ML IJ SOLN
80.0000 mg | Freq: Once | INTRAMUSCULAR | Status: AC
  Administered 2019-06-23: 80 mg via INTRAVENOUS

## 2019-06-23 MED ORDER — LACTULOSE 10 GM/15ML PO SOLN
20.0000 g | Freq: Two times a day (BID) | ORAL | Status: DC
  Administered 2019-06-23 – 2019-06-24 (×2): 20 g via ORAL
  Filled 2019-06-23 (×3): qty 30

## 2019-06-23 MED ORDER — FUROSEMIDE 10 MG/ML IJ SOLN
INTRAMUSCULAR | Status: AC
  Filled 2019-06-23: qty 8

## 2019-06-23 MED ORDER — MEXILETINE HCL 200 MG PO CAPS
200.0000 mg | ORAL_CAPSULE | Freq: Two times a day (BID) | ORAL | Status: DC
  Administered 2019-06-23 – 2019-06-25 (×4): 200 mg via ORAL
  Filled 2019-06-23 (×4): qty 1

## 2019-06-23 MED ORDER — TORSEMIDE 20 MG PO TABS
20.0000 mg | ORAL_TABLET | Freq: Every day | ORAL | Status: DC
  Administered 2019-06-24: 20 mg via ORAL
  Filled 2019-06-23: qty 1

## 2019-06-23 MED ORDER — AMIODARONE HCL 200 MG PO TABS
200.0000 mg | ORAL_TABLET | Freq: Two times a day (BID) | ORAL | Status: DC
  Administered 2019-06-23 – 2019-06-25 (×4): 200 mg via ORAL
  Filled 2019-06-23 (×4): qty 1

## 2019-06-23 NOTE — Progress Notes (Signed)
Patient and family stating that patient no longer wants to be comfort care. Patient's family requesting that patient's ICD be reactivated. Dr. Sarajane Jews notified; states to contact device rep for patient's ICD.   RN spoke with Virginia Crews, rep for [employer]. Keenan Bachelor states he will come to the bedside this afternoon to reprogram device.

## 2019-06-23 NOTE — Progress Notes (Addendum)
PROGRESS NOTE  Stephanie Yates TMH:[id number] DOB: 04-May-1946 DOA: 06/18/2019 PCP: Josetta Huddle, MD  Brief History   73 year old woman with complex PMH below recently hospitalized 12/13-12/21, presented 12/20 with lethargy, decreased eating, decreased strength and increased confusion, admitted for acute on chronic CHF complicated by acute on chronic CKD, hypertension and hepatic encephalopathy requiring lactulose.  Hospital course complicated by persistent hypotension limiting diuresis and worsening renal function.  Daughter requested 48-hour window to see if patient responded to medical and treatment without escalation of care.  12/26 patient reporting worsening abdominal pain, treated with Lasix.  Had sudden respiratory distress 12/26 with hemoptysis requiring nonrebreather.  Chest x-ray consistent with pulmonary edema.  Patient made DNR/DNI, family did not want escalate to BiPAP ideation.  Discussion between Dr. Lonny Prude and daughter, patient was transitioned to comfort measures 12/26.  A & P  Acute on chronic systolic CHF, nonischemic cardiomyopathy, LVEF 10-15%; PMH VT status post AICD 2014, biventricular PPM.  Per chart not a candidate for advanced CHF therapies based on last cardiology evaluation 12/18.  Diuresis limited secondary to hypotension.  --Urine output not recorded but lung exam was clear today, patient speaking in full sentences --Resume heart failure medications.  Hemoptysis --Significance unclear.  No further evaluation suggested at this point.  Continue to monitor.  Persistent hypotension, orthostatics negative --Blood pressure stable today.  CKD stage IV --.  Stable.  Cirrhosis, small volume ascites.    Paroxysmal atrial fibrillation --Continue amiodarone, mexiletine  Microcytic anemia, with rectal bleeding earlier in hospitalization.  Eliquis was discontinued.  Was seen by GI 11/30, no further evaluation suggested.  Discussed with grandson at bedside and with  daughter by telephone, daughter is POA.  She concurs with her mother's request to resend comfort care status.  Patient is not comfort care.  Continues DNR/DNI.  Resume heart failure and other medications.  I discussed in detail with RN.  Long-term prognosis remains guarded.  DVT prophylaxis: SCDs Code Status: DNR/DNI Family Communication: daughter by telephone, grandson at bedside Disposition Plan: pending    Murray Hodgkins, MD  [facility] Direct contact: see [email or web address] (further directions at bottom of note if needed) 7PM-7AM contact night coverage as at bottom of note 06/23/2019, 2:28 PM  LOS: 4 days   Significant Hospital Events   .    Consults:  .    Procedures:  .   Significant Diagnostic Tests:  Marland Kitchen    Micro Data:  .    Antimicrobials:  .   Interval History/Subjective  Patient awake, alert today, I do not want to be comfort care.  I want to take, and medications.  Objective   Vitals:  Vitals:   06/22/19 1600 06/23/19 0918  BP: (!) 129/59 (!) 107/57  Pulse: 60 70  Resp: (!) 22 19  Temp:    SpO2: 97% 98%    Exam:  Constitutional.  Appears calm, comfortable. Respiratory.  Clear to auscultation bilaterally.  No wheezes, rales or rhonchi.  Normal respiratory effort. Cardiovascular.  Regular rate and rhythm.  No murmur, rub or gallop.  No lower extremity edema. Abdomen.  Soft. Psychiatric.  Alert, oriented to self, location, month, "2021", grandson Belenda Cruise at bedside  I have personally reviewed the following:   Today's Data  . No new data today. . Chest x-ray yesterday showed CHF . I/O+ 1.2 L  Scheduled Meds: Continuous Infusions:  Active Problems:   Nonischemic cardiomyopathy (HCC)   Acute on chronic combined systolic and diastolic CHF (congestive heart failure) ([place])  PAF (paroxysmal atrial fibrillation) (HCC)   ICD (implantable cardioverter-defibrillator), dual, [employer]   Symptomatic anemia   Chronic anticoagulation   NSVT  (nonsustained ventricular tachycardia) (HCC)   CKD (chronic kidney disease) stage 4, GFR 15-29 ml/min (HCC)   Acute kidney injury superimposed on CKD (HCC)   Weakness generalized   Goals of care, counseling/discussion   DNR (do not resuscitate)   Dizziness   Hypotension   Other cirrhosis of liver (HCC)   Nausea   DNI (do not intubate)   End of life care   Acute on chronic respiratory failure with hypoxia ([place])   LOS: 4 days   How to contact the [facility] Attending or Consulting provider [place] or covering provider during after hours 7P -7A, for this patient?  1. Check the care team in [facility] and look for a) attending/consulting [facility] provider listed and b) the [facility] [place] team listed 2. Log into [email or web address] and use [place]'s universal password to access. If you do not have the password, please contact the hospital operator. 3. Locate the [facility] provider you are looking for under [facility] and page to a number that you can be directly reached. 4. If you still have difficulty reaching the provider, please page the [facility] (Director on Call) for the Hospitalists listed on amion for assistance.

## 2019-06-23 NOTE — Progress Notes (Addendum)
Chaplain responding to spiritual care consult re:  EOL.  However, physician was bedside and this chaplain read in progress notes a possible reversal of EOL status, which might contraindicate a chaplain visit at this time.    Please advise if spiritual care support is warranted at this time.    Minus [place], [place]

## 2019-06-23 NOTE — Progress Notes (Signed)
Chaplain returned to check on pt following new conversations between family and medical staff around possibly reversing EOL status.  Pt was sleeping, but seemed restless and uncomfortable.  She stirred slightly but did not respond to questions.  Chaplain offered prayer.    Ongoing spiritual care services are available upon request.    [name] [phone]     06/23/19 1900  Clinical Encounter Type  Visited With Patient  Visit Type Initial;Critical Care  Referral From  (spiritual care consult)  Consult/Referral To Chaplain  Stress Factors  Patient Stress Factors Major life changes

## 2019-06-24 ENCOUNTER — Inpatient Hospital Stay (HOSPITAL_COMMUNITY): Payer: Medicare Other

## 2019-06-24 DIAGNOSIS — N179 Acute kidney failure, unspecified: Secondary | ICD-10-CM

## 2019-06-24 DIAGNOSIS — N189 Chronic kidney disease, unspecified: Secondary | ICD-10-CM

## 2019-06-24 LAB — BASIC METABOLIC PANEL
Anion gap: 8 (ref 5–15)
BUN: 43 mg/dL — ABNORMAL HIGH (ref 8–23)
CO2: 22 mmol/L (ref 22–32)
Calcium: 8 mg/dL — ABNORMAL LOW (ref 8.9–10.3)
Chloride: 103 mmol/L (ref 98–111)
Creatinine, Ser: 3.26 mg/dL — ABNORMAL HIGH (ref 0.44–1.00)
GFR calc Af Amer: 15 mL/min — ABNORMAL LOW (ref >60)
GFR calc non Af Amer: 13 mL/min — ABNORMAL LOW (ref >60)
Glucose, Bld: 120 mg/dL — ABNORMAL HIGH (ref 70–99)
Potassium: 3.5 mmol/L (ref 3.5–5.1)
Sodium: 133 mmol/L — ABNORMAL LOW (ref 135–145)

## 2019-06-24 LAB — TYPE AND SCREEN
ABO/RH(D): O POS
Antibody Screen: POSITIVE
DAT, IgG: NEGATIVE
Donor AG Type: NEGATIVE
Donor AG Type: NEGATIVE
Unit division: 0
Unit division: 0

## 2019-06-24 LAB — CBC
HCT: 22.7 % — ABNORMAL LOW (ref 36.0–46.0)
Hemoglobin: 7.6 g/dL — ABNORMAL LOW (ref 12.0–15.0)
MCH: 25.9 pg — ABNORMAL LOW (ref 26.0–34.0)
MCHC: 33.5 g/dL (ref 30.0–36.0)
MCV: 77.2 fL — ABNORMAL LOW (ref 80.0–100.0)
Platelets: 147 10*3/uL — ABNORMAL LOW (ref 150–400)
RBC: 2.94 MIL/uL — ABNORMAL LOW (ref 3.87–5.11)
RDW: 19.1 % — ABNORMAL HIGH (ref 11.5–15.5)
WBC: 18 10*3/uL — ABNORMAL HIGH (ref 4.0–10.5)
nRBC: 0 % (ref 0.0–0.2)

## 2019-06-24 LAB — BPAM RBC
Blood Product Expiration Date: 202012282359
Blood Product Expiration Date: 202101092359
ISSUE DATE / TIME: 202012241553
Unit Type and Rh: 5100
Unit Type and Rh: 9500

## 2019-06-24 MED ORDER — ALBUTEROL SULFATE (2.5 MG/3ML) 0.083% IN NEBU
INHALATION_SOLUTION | RESPIRATORY_TRACT | Status: AC
  Administered 2019-06-24: 2.5 mg via RESPIRATORY_TRACT
  Filled 2019-06-24: qty 3

## 2019-06-24 MED ORDER — ALBUTEROL SULFATE (2.5 MG/3ML) 0.083% IN NEBU: 2.5000 mg | INHALATION_SOLUTION | Freq: Four times a day (QID) | RESPIRATORY_TRACT | Status: DC | PRN

## 2019-06-24 MED ORDER — TORSEMIDE 20 MG PO TABS: 40.0000 mg | ORAL_TABLET | Freq: Every day | ORAL | Status: DC

## 2019-06-24 NOTE — Care Management Important Message (Signed)
Important Message  Patient Details  Name: EMERI ESTILL MRN: [id number] Date of Birth: 1946/04/01   Medicare Important Message Given:  Yes     Shelda Altes 06/24/2019, 1:07 PM

## 2019-06-24 NOTE — Progress Notes (Signed)
Spoke with Dr. Sarajane Jews, little improvement from neb. More lethargic and dyspneic. States he will call family. Cont to monitor. Carroll Kinds RN

## 2019-06-24 NOTE — Progress Notes (Addendum)
Pt having resp distress, wheezing sat's 90's on 5L. Resp rate 28. Increased to 6L sats- 92-96%. I asked her did she want me to call her family and she said no. Dr. Sarajane Jews paiged about condition change. Also found blood on purewick from vagina Dr. Sarajane Jews made aware. New orders to be placed. Cont to monitor  Carroll Kinds RN

## 2019-06-24 NOTE — Significant Event (Signed)
Called to see patient for respiratory distress.  On exam the patient appears tachypneic and dyspneic.  She opens her eyes to voice but does not follow commands.  Breathing is labored and there is some abdominal breathing. Constitutional.  The patient appears critically ill. Respiratory.  Labored breathing, increased respiratory effort.  Lungs are clear with good breath sounds and air movement.  No wheezes, rales or rhonchi. Cardiovascular.  Sinus rhythm on telemetry.  Regular rate and rhythm.  No murmur, rub or gallop.  There is no lower extremity edema.  Assessment/plan End-stage systolic heart failure.  Notes reviewed, not a candidate for any advanced therapies.  Cardiology consult from December 14 recommended diuresis and hospice consult.  She has responded poorly to diuresis with aggressive Lasix dosing and appears to be developing cardiorenal syndrome with increasing creatinine, poor urine output and persistent pulmonary edema on chest x-ray.  Given end-stage disease, previous cardiology recommendations, no overall presentation I am concerned patient may not survive this hospitalization.  In review of chart, her records, and assessment of her clinical condition, there is not appear to be anything else to offer other than supportive care and ongoing therapies.  I discussed with her daughter by telephone and apprised her of turn for the worse, critical illness and possible death during this hospitalization.  She understands, requests to continue medications as able, but if no improvement, transition to comfort care and allow her to pass in peace.  Murray Hodgkins, MD [facility] [phone]

## 2019-06-24 NOTE — Progress Notes (Signed)
Pt given albuterol breathing tx. Pt remains tachypnic. Cont to monitor. Carroll Kinds RN

## 2019-06-24 NOTE — Progress Notes (Signed)
PROGRESS NOTE  Stephanie Yates GQQ:[id number] DOB: [birth year] DOA: 06/18/2019 PCP: Stephanie Huddle, MD  Brief History   73 year old woman with complex PMH below recently hospitalized 12/13-12/21, presented 12/20 with lethargy, decreased eating, decreased strength and increased confusion, admitted for acute on chronic CHF complicated by acute on chronic CKD, hypertension and hepatic encephalopathy requiring lactulose.  Hospital course complicated by persistent hypotension limiting diuresis and worsening renal function.  Daughter requested 48-hour window to see if patient responded to medical and treatment without escalation of care.  12/26 patient reporting worsening abdominal pain, treated with Lasix.  Had sudden respiratory distress 12/26 with hemoptysis requiring nonrebreather.  Chest x-ray consistent with pulmonary edema.  Patient made DNR/DNI, family did not want escalate to BiPAP ideation.  Discussion between Dr. Lonny Yates and daughter, patient was transitioned to comfort measures 12/26.  A & P  Acute on chronic systolic CHF, nonischemic cardiomyopathy, LVEF 10-15%; PMH VT status post AICD 2014, biventricular PPM.  Per chart not a candidate for advanced CHF therapies based on last cardiology evaluation 12/18.  Diuresis limited secondary to hypotension.  --Continue heart failure medications.  Hemoptysis --Significance unclear.  No further evaluation suggested at this point.  Continue to monitor.  Persistent hypotension, orthostatics negative --Blood pressure remained stable.  AKI superimposed on CKD stage IV --Creatinine higher with diuresis.  Stop diuretics.  Monitor clinically. --BMP in a.m.  Cirrhosis, small volume ascites.    Paroxysmal atrial fibrillation --Continue amiodarone, mexiletine  Microcytic anemia, with rectal bleeding earlier in hospitalization.  Did get 1 unit PRBC 12/24.  Eliquis was discontinued.  Was seen by GI 11/30, no further evaluation suggested. --No signs or  symptoms of bleeding.  Hemoglobin appears to be anywhere from 7-10 over multiple studies dating back to October. --We will repeat CBC in a.m.  Doubt significant blood loss.  Leukocytosis.  Present since at least May 23, 2019.  As high as 24.2 during previous hospitalization.  Etiology unclear. --No signs or symptoms to suggest infection.  Remains afebrile.   --Check CBC with differential in AM.  Complex patient with severe cardiomyopathy, followed in the past by palliative medicine.  Multiple hospitalizations over the last year.  She appears to be end-stage.  Will follow renal function and respiratory status.  Will discuss further with family.  DVT prophylaxis: SCDs Code Status: DNR/DNI Family Communication:  Disposition Plan: pending    Stephanie Hodgkins, MD  [facility] Direct contact: see [email or web address] (further directions at bottom of note if needed) 7PM-7AM contact night coverage as at bottom of note 06/24/2019, 11:56 AM  LOS: 5 days   Significant Hospital Events   .    Consults:  .    Procedures:  .   Significant Diagnostic Tests:  Marland Kitchen    Micro Data:  .    Antimicrobials:  .   Interval History/Subjective  Feels okay today, breathing okay, no pain, no complaints.  Objective   Vitals:  Vitals:   06/23/19 2156 06/24/19 0522  BP: (!) 100/59 (!) 126/50  Pulse:  (!) 59  Resp:  16  Temp: 98.7 F (37.1 C) 97.6 F (36.4 C)  SpO2:  99%    Exam:  Constitutional.  Appears calm, comfortable. Psychiatric.  Grossly normal mood and affect.,  Speech fluent and appropriate. Cardiovascular.  Regular rate and rhythm.  No murmur, rub or gallop.  No lower extremity edema.  Telemetry sinus rhythm. Respiratory.  Clear to auscultation bilaterally.  No wheezes, rales or rhonchi.  Normal respiratory effort.  I have  personally reviewed the following:   Today's Data  . No voids recorded. . Potassium 3.5, BUN up to 43, creatinine up to 3.26 . WBC slightly improved at  18.0, hemoglobin lower at 7.6, platelets down to 147.  Wonder if this CBC is accurate.  Scheduled Meds: . amiodarone  200 mg Oral BID  . atorvastatin  40 mg Oral q1800  . carvedilol  3.125 mg Oral BID WC  . lactulose  20 g Oral BID  . mexiletine  200 mg Oral BID  . pantoprazole  40 mg Oral Daily  . torsemide  20 mg Oral Daily   Continuous Infusions:  Active Problems:   Nonischemic cardiomyopathy (HCC)   Acute on chronic combined systolic and diastolic CHF (congestive heart failure) (HCC)   PAF (paroxysmal atrial fibrillation) (HCC)   ICD (implantable cardioverter-defibrillator), dual, [place]   Symptomatic anemia   Chronic anticoagulation   NSVT (nonsustained ventricular tachycardia) (HCC)   CKD (chronic kidney disease) stage 4, GFR 15-29 ml/min (HCC)   Acute kidney injury superimposed on CKD (HCC)   Weakness generalized   Goals of care, counseling/discussion   DNR (do not resuscitate)   Dizziness   Hypotension   Other cirrhosis of liver (HCC)   Nausea   DNI (do not intubate)   End of life care   Acute on chronic respiratory failure with hypoxia ([place])   LOS: 5 days   How to contact the [facility] Attending or Consulting provider [place] or covering provider during after hours 7P -7A, for this patient?  1. Check the care team in [facility] and look for a) attending/consulting [facility] provider listed and b) the [facility] team listed 2. Log into [email or web address] and use [place]'s universal password to access. If you do not have the password, please contact the hospital operator. 3. Locate the [facility] provider you are looking for under [facility] and page to a number that you can be directly reached. 4. If you still have difficulty reaching the provider, please page the [facility] (Director on Call) for the Hospitalists listed on amion for assistance.

## 2019-06-25 DIAGNOSIS — J9601 Acute respiratory failure with hypoxia: Secondary | ICD-10-CM

## 2019-06-25 DIAGNOSIS — D696 Thrombocytopenia, unspecified: Secondary | ICD-10-CM

## 2019-06-25 DIAGNOSIS — R531 Weakness: Secondary | ICD-10-CM

## 2019-06-25 DIAGNOSIS — Z515 Encounter for palliative care: Secondary | ICD-10-CM

## 2019-06-25 DIAGNOSIS — R0602 Shortness of breath: Secondary | ICD-10-CM

## 2019-06-25 DIAGNOSIS — I5023 Acute on chronic systolic (congestive) heart failure: Secondary | ICD-10-CM

## 2019-06-25 DIAGNOSIS — I130 Hypertensive heart and chronic kidney disease with heart failure and stage 1 through stage 4 chronic kidney disease, or unspecified chronic kidney disease: Secondary | ICD-10-CM

## 2019-06-25 DIAGNOSIS — I1310 Hypertensive heart and chronic kidney disease without heart failure, with stage 1 through stage 4 chronic kidney disease, or unspecified chronic kidney disease: Secondary | ICD-10-CM

## 2019-06-25 LAB — BASIC METABOLIC PANEL
Anion gap: 9 (ref 5–15)
BUN: 50 mg/dL — ABNORMAL HIGH (ref 8–23)
CO2: 24 mmol/L (ref 22–32)
Calcium: 8.1 mg/dL — ABNORMAL LOW (ref 8.9–10.3)
Chloride: 104 mmol/L (ref 98–111)
Creatinine, Ser: 3.51 mg/dL — ABNORMAL HIGH (ref 0.44–1.00)
GFR calc Af Amer: 14 mL/min — ABNORMAL LOW (ref >60)
GFR calc non Af Amer: 12 mL/min — ABNORMAL LOW (ref >60)
Glucose, Bld: 82 mg/dL (ref 70–99)
Potassium: 4 mmol/L (ref 3.5–5.1)
Sodium: 137 mmol/L (ref 135–145)

## 2019-06-25 LAB — CBC WITH DIFFERENTIAL/PLATELET
Abs Immature Granulocytes: 0.07 10*3/uL (ref 0.00–0.07)
Basophils Absolute: 0 10*3/uL (ref 0.0–0.1)
Basophils Relative: 0 %
Eosinophils Absolute: 0.1 10*3/uL (ref 0.0–0.5)
Eosinophils Relative: 1 %
HCT: 22.4 % — ABNORMAL LOW (ref 36.0–46.0)
Hemoglobin: 7.5 g/dL — ABNORMAL LOW (ref 12.0–15.0)
Immature Granulocytes: 1 %
Lymphocytes Relative: 11 %
Lymphs Abs: 1.4 10*3/uL (ref 0.7–4.0)
MCH: 26 pg (ref 26.0–34.0)
MCHC: 33.5 g/dL (ref 30.0–36.0)
MCV: 77.5 fL — ABNORMAL LOW (ref 80.0–100.0)
Monocytes Absolute: 1.2 10*3/uL — ABNORMAL HIGH (ref 0.1–1.0)
Monocytes Relative: 9 %
Neutro Abs: 10.3 10*3/uL — ABNORMAL HIGH (ref 1.7–7.7)
Neutrophils Relative %: 78 %
Platelets: 98 10*3/uL — ABNORMAL LOW (ref 150–400)
RBC: 2.89 MIL/uL — ABNORMAL LOW (ref 3.87–5.11)
RDW: 19.1 % — ABNORMAL HIGH (ref 11.5–15.5)
WBC: 13.2 10*3/uL — ABNORMAL HIGH (ref 4.0–10.5)
nRBC: 0 % (ref 0.0–0.2)

## 2019-06-25 MED ORDER — LORAZEPAM 2 MG/ML IJ SOLN: 1.0000 mg | Freq: Four times a day (QID) | INTRAMUSCULAR | Status: DC | PRN

## 2019-06-25 MED ORDER — MORPHINE SULFATE (PF) 2 MG/ML IV SOLN
1.0000 mg | INTRAVENOUS | Status: DC | PRN
  Administered 2019-06-25 – 2019-06-27 (×4): 1 mg via INTRAVENOUS
  Filled 2019-06-25 (×4): qty 1

## 2019-06-25 NOTE — Consult Note (Signed)
Consultation Note Date: 06/25/2019   Patient Name: Stephanie Yates  DOB: [birth year]  MRN: [id number]  Age / Sex: 73 y.o., female  PCP: Josetta Huddle, MD Referring Physician: Samuella Cota, MD  Reason for Consultation: Establishing goals of care, Non pain symptom management and Psychosocial/spiritual support  HPI/Patient Profile: 73 y.o. female  admitted on 06/18/2019 with medical history significant ofCHF/NICM (EF 10 to 15%), VT s/p [id number] and biventricular PPM, chronic anemia, CKD-4, HTN, HLD, moderate protein calorie malnutrition and GIB, prolonged QT/torsade who presents with complaint of dizziness following discharge yesterday.  Unfortunately she has had 9  Admission in the past 6 months, she is failing to thrive   She was admitted previously for acute on chronic combined systolic diastolic CHF exacerbation.  She was evaluated by heart failure team and conversation was started with palliative care.  Patient reportedly decided to make her DNR but was not yet ready for hospice.  Patient was discharged home with home health PT/OT/RN and a Yates.  Since discharge patient reports that she has been very dizzy with laying down and standing.  She was advised come to ED by her physical therapist.  She endorsed being nauseous but no vomiting.  Has had decreased p.o. intake since discharge yesterday.  ED Course: She was afebrile and was hypotensive on admission down to 93/43 on room air.  Medical admission was requested given patient received 500 cc bolus but remained hypotensive and there was concern of fluid overload given her CKD and nonischemic cardiomyopathy with low EF.  Today is day 7 of this hospital stay and patient is lethargic, worsening renal function, poor po intake and continues to fail to thrive regardless attempts of medical management.  Patient is high risk for decompensation  Patient and  family have struggled with decisions related to plan of care, they waver between aggressive medical interventions and a palliative comfort path.  Again today patient and family face treatment option decisions, advanced directive decisions and anticipatory care needs.   Clinical Assessment and Goals of Care:  Patient is lethargic and unable to participate in a detailed conversation today.  She appears uncomfortable, dyspneic with furrowed brow   This NP Wadie Lessen reviewed medical records, received report from team, assessed the patient and then spoke to her daughter/Stephanie Yates  to discuss diagnosis, prognosis, GOC, EOL wishes disposition and options.  I introduced myself and the role of palliative medicine into a holistic treatment plan to daughter.  We have spoken multiple times on past admissions.  A detailed discussion was had today regarding advanced directives.  Concepts specific to code status, AICD/ need to de-activate, artifical feeding and hydration, continued IV antibiotics and rehospitalization was had.    The difference between a aggressive medical intervention path  and a palliative comfort care path for this patient at this time was had.  Values and goals of care important to patient and family were attempted to be elicited.  Her daughter has verbalized an understanding of her mother's serious life limiting illnesses and  long-term poor prognosis.  She has always tried to support her mother's decisions.  She verbalizes today a clear desire for treatment plan to focus on comfort and dignity.  Plan of care -DNR DNI -No artificial feeding or hydration now or in the future -No further lab draws, diagnostics or life prolonging measures--allow for a natural death -Deactivate AICD -Symptom management specific to dyspnea and pain  All the above topics were discussed in detail with daughter. Questions and concerns addressed.     Natural trajectory and expectations at EOL were  discussed.  Shared my concern that anything could happen at anytime and her mother's prognosis is likely days to weeks.   Family encouraged to call with questions or concerns.    PMT will continue to support holistically.      NEXT OF KIN/daughter/Stephanie Yates    SUMMARY OF RECOMMENDATIONS    Code Status/Advance Care Planning:  DNR   Comfort and dignity focus of care   Symptom Management:   Dyspnea/pain: Morphine 1 mg IV every 2 hrs as needed  Agitation/anxiety: Ativan 1 mg IV every 6 hours as needed   Palliative Prophylaxis:   Frequent Pain Assessment and Oral Care  Additional Recommendations (Limitations, Scope, Preferences):  Full Comfort Care  Psycho-social/Spiritual:   Desire for further Chaplaincy support:no  Additional Recommendations: Education on Hospice  Prognosis:   Hours - Days   Encouraged family to visit with patient  Discharge Planning: Evaluate in the morning.  Discussed with family the possibility of residential hospice versus home with hospice.   To Be Determined      Primary Diagnoses: Present on Admission: . Acute on chronic combined systolic and diastolic CHF (congestive heart failure) ([place]) . PAF (paroxysmal atrial fibrillation) ([place]) . ICD (implantable cardioverter-defibrillator), dual, [facility] . CKD (chronic kidney disease) stage 4, GFR 15-29 ml/min (HCC) . DNR (do not resuscitate) . Hypotension . Acute kidney injury superimposed on CKD ([place]) . Other cirrhosis of liver ([place]) . Symptomatic anemia   I have reviewed the medical record, interviewed the patient and family, and examined the patient. The following aspects are pertinent.  Past Medical History:  Diagnosis Date  . Chronic anemia   . Chronic systolic CHF (congestive heart failure) ([place])   . CKD (chronic kidney disease), stage IV ([place])   . Former tobacco use   . History of tobacco abuse   . Hyperlipidemia   . Hypertension   . Mitral regurgitation   .  Nonischemic cardiomyopathy ([place])    a. EF 10-15% on 11/2012 cath with no significant CAD. b. EF 25% in 2019. b. EF 15% in 12/2018  . PAF (paroxysmal atrial fibrillation) ([place])   . Protein calorie malnutrition ([place])   . Quit consuming alcohol in remote past   . Sinus bradycardia 12/21/2012  . Ventricular tachycardia ([place]) 12/14/2012   Social History   Socioeconomic History  . Marital status: Widowed    Spouse name: Not on file  . Number of children: Not on file  . Years of education: Not on file  . Highest education level: Not on file  Occupational History  . Not on file  Tobacco Use  . Smoking status: Former Research scientist (life sciences)  . Smokeless tobacco: Never Used  Substance and Sexual Activity  . Alcohol use: No  . Drug use: No  . Sexual activity: Not Currently  Other Topics Concern  . Not on file  Social History Narrative  . Not on file   Social Determinants of Health   Financial  Resource Strain:   . Difficulty of Paying Living Expenses: Not on file  Food Insecurity:   . Worried About Charity fundraiser in the Last Year: Not on file  . Ran Out of Food in the Last Year: Not on file  Transportation Needs:   . Lack of Transportation (Medical): Not on file  . Lack of Transportation (Non-Medical): Not on file  Physical Activity:   . Days of Exercise per Week: Not on file  . Minutes of Exercise per Session: Not on file  Stress:   . Feeling of Stress : Not on file  Social Connections:   . Frequency of Communication with Friends and Family: Not on file  . Frequency of Social Gatherings with Friends and Family: Not on file  . Attends Religious Services: Not on file  . Active Member of Clubs or Organizations: Not on file  . Attends Archivist Meetings: Not on file  . Marital Status: Not on file   Family History  Problem Relation Age of Onset  . Cancer Mother    Scheduled Meds: . amiodarone  200 mg Oral BID  . atorvastatin  40 mg Oral q1800  . carvedilol  3.125 mg Oral BID WC   . lactulose  20 g Oral BID  . mexiletine  200 mg Oral BID  . pantoprazole  40 mg Oral Daily   Continuous Infusions: PRN Meds:.acetaminophen Medications Prior to Admission:  Prior to Admission medications   Medication Sig Start Date End Date Taking? Authorizing Provider  acetaminophen (TYLENOL) 500 MG tablet Take 1,000 mg by mouth every 6 (six) hours as needed (pain).   Yes [provider]  albuterol (VENTOLIN HFA) 108 (90 Base) MCG/ACT inhaler Inhale 2 puffs into the lungs every 4 (four) hours as needed for wheezing or shortness of breath.   Yes [provider]  apixaban (ELIQUIS) 2.5 MG TABS tablet Take 1 tablet (2.5 mg total) by mouth 2 (two) times daily. 03/19/19  Yes Debbe Odea, MD  atorvastatin (LIPITOR) 40 MG tablet Take 1 tablet (40 mg total) by mouth daily at 6 PM. 07/05/18  Yes Deboraha Sprang, MD  carvedilol (COREG) 3.125 MG tablet Take 1 tablet (3.125 mg total) by mouth 2 (two) times daily with a meal. 09/12/18  Yes Deboraha Sprang, MD  Ensure (ENSURE) Take 237 mLs by mouth every morning.    Yes [provider]  ferrous gluconate (FERGON) 324 MG tablet Take 324 mg by mouth 2 (two) times daily.  07/05/18  Yes [provider]  magnesium oxide (MAGNESIUM-OXIDE) 400 (241.3 Mg) MG tablet Take 1 tablet (400 mg total) by mouth daily. Patient taking differently: Take 1 tablet by mouth every morning.  05/14/19  Yes [place] Shi, MD  meclizine (ANTIVERT) 12.5 MG tablet Take 1 tablet (12.5 mg total) by mouth 2 (two) times daily as needed for dizziness. 06/17/19  Yes Swayze, Ava, DO  mexiletine (MEXITIL) 200 MG capsule Take 1 capsule (200 mg total) by mouth 2 (two) times daily. 01/24/19  Yes [name], PA-C  ondansetron (ZOFRAN ODT) 4 MG disintegrating tablet Take 1 tablet (4 mg total) by mouth every 8 (eight) hours as needed for nausea or vomiting. 03/21/19  Yes [place] Eric J, DO  pantoprazole (PROTONIX) 40 MG tablet Take 1 tablet (40 mg total) by  mouth daily. 04/05/19 07/04/19 Yes [place] Eric J, DO  polyethylene glycol (MIRALAX / GLYCOLAX) 17 g packet Take 17 g by mouth daily. Can take up to  twice a day for constipation. Patient taking differently: Take 17 g by mouth every morning.  03/19/19  Yes Rizwan, Eunice Blase, MD  torsemide (DEMADEX) 20 MG tablet Take 2 tablets (40 mg total) by mouth daily. 06/18/19  Yes Swayze, Ava, DO  amiodarone (PACERONE) 200 MG tablet Take 1 tablet (200 mg total) by mouth 2 (two) times daily. 05/14/19 06/13/19  [place] Shi, MD  lactulose (CEPHULAC) 20 g packet Take 1 packet (20 g total) by mouth 2 (two) times daily. Hold for 3 or more than 3 bowel movements per day. 06/07/19 07/07/19  Darliss Cheney, MD   Allergies  Allergen Reactions  . Strawberry Extract Hives, Itching and Swelling        Review of Systems  Unable to perform ROS: Acuity of condition    Physical Exam Constitutional:      Appearance: She is cachectic. She is ill-appearing.     Interventions: Nasal cannula in place.  Cardiovascular:     Rate and Rhythm: Normal rate.  Pulmonary:     Effort: Tachypnea present.     Breath sounds: Decreased air movement present.  Skin:    General: Skin is warm and dry.  Neurological:     Mental Status: She is lethargic.     Vital Signs: BP (!) 108/54 (BP Location: Left Arm)   Pulse 60   Temp (!) 97.4 F (36.3 C) (Axillary)   Resp 10   Ht 5' (1.524 m)   Wt 59.3 kg   SpO2 100%   BMI 25.53 kg/m  Pain Scale: Faces   Pain Score: Asleep   SpO2: SpO2: 100 % O2 Device:SpO2: 100 % O2 Flow Rate: .O2 Flow Rate (L/min): 6 L/min  IO: Intake/output summary:   Intake/Output Summary (Last 24 hours) at 06/25/2019 0957 Last data filed at 06/25/2019 2774 Gross per 24 hour  Intake 120 ml  Output 102 ml  Net 18 ml    LBM: Last BM Date: 06/22/19 Baseline Weight: Weight: 57.3 kg Most recent weight: Weight: 59.3 kg     Palliative Assessment/Data: 20 %   Discussed with Dr Sarajane Jews and bedside  RN  Time In: 0900 Time Out: 1010 Time Total: 70 minutes Greater than 50%  of this time was spent counseling and coordinating care related to the above assessment and plan.  Signed by: Wadie Lessen, NP   Please contact Palliative Medicine Team phone at [phone] for questions and concerns.  For individual provider: See Shea Evans

## 2019-06-25 NOTE — Progress Notes (Addendum)
PROGRESS NOTE  Stephanie Yates TZG:[id number] DOB: 19-Feb-1946 DOA: 06/18/2019 PCP: Josetta Huddle, MD  Brief History   73 year old woman with complex PMH below recently hospitalized 12/13-12/21, presented 12/20 with lethargy, decreased eating, decreased strength and increased confusion, admitted for acute on chronic CHF complicated by acute on chronic CKD, hypertension and hepatic encephalopathy requiring lactulose.  Hospital course complicated by persistent hypotension limiting diuresis and worsening renal function.  Daughter requested 48-hour window to see if patient responded to medical and treatment without escalation of care.  12/26 patient reporting worsening abdominal pain, treated with Lasix.  Had sudden respiratory distress 12/26 with hemoptysis requiring nonrebreather.  Chest x-ray consistent with pulmonary edema.  Patient made DNR/DNI, family did not want escalate to BiPAP ideation.  Discussion between Dr. Lonny Prude and daughter, patient was transitioned to comfort measures 12/26.  A & P  Acute on chronic end-stage combined systolic/diastolic CHF, nonischemic cardiomyopathy, LVEF 10-15% with associated acute hypoxic respiratory failure; PMH VT status post AICD 2014, biventricular PPM.  Per chart not a candidate for advanced CHF therapies based on last cardiology evaluation 12/18.  Diuresis limited secondary to hypotension.  --Poor response to diuretics, now with worsening renal function consistent with cardiorenal syndrome. --SPO2 stable on 6 L nasal cannula and currently respirations appear stable.  Lungs are clear and she has no evidence of volume overload on exam.  AKI superimposed on CKD stage IV, cardiorenal syndrome --Creatinine rose with diuretics, urine output was poor, not responding well to diuretics. --Worsening now secondary to cardiorenal syndrome.  Thrombocytopenia --Not on any heparin or Lovenox.  Etiology unclear.  Suspect secondary to end-stage disease.  No signs or symptoms  of infection.  Hemoptysis --No recurrence, this occurred approximately 4 days ago.  No further evaluation suggested at this point.  Continue to monitor.  Persistent hypotension, orthostatics negative --Overall blood pressure has been stable with systolic blood pressure in the low 100s.  Cirrhosis, small volume ascites.   --No significant ascites on my exam  Paroxysmal atrial fibrillation --Rate controlled.  Rhythm paced.  Continue amiodarone, mexiletine  Microcytic anemia, with rectal bleeding earlier in hospitalization.  Did get 1 unit PRBC 12/24.  Eliquis was discontinued.  Was seen by GI 11/30, no further evaluation suggested. --Some small amount of vaginal bleeding noted by RN yesterday.  No evidence of gross bleeding.  Hemoglobin remained stable.   --No further evaluation suggested at this time.  Leukocytosis.  Present since at least May 23, 2019.  As high as 24.2 during previous hospitalization.  Etiology unclear. --No evidence of infection.  WBC is trending down, 13.2 today.  Complex patient with end-stage systolic heart failure, not a candidate for advanced therapies, previous recommendations from cardiology have been for hospice.  She is worsening acute kidney injury secondary to cardiorenal syndrome, has been poorly responsive to diuretics, has worsening hypoxic respiratory failure and now is developing thrombocytopenia.  Although blood pressure and oxygenation is currently stable, I suspect prognosis less than 2 weeks, perhaps days.  I discussed with palliative medicine, she has discussed with daughter, concur with transition to full comfort care and consideration of residential hospice.  Turn off ICD. When I discussed with daughter yesterday, she was agreeable to comfort care for the patient did not improve, which is certainly the case.  DVT prophylaxis: SCDs Code Status: DNR/DNI Family Communication:  Disposition Plan: pending    Murray Hodgkins, MD  [facility] Direct contact: see [email or web address] (further directions at bottom of note if needed) 7PM-7AM contact night  coverage as at bottom of note 06/25/2019, 10:04 AM  LOS: 6 days   Significant Hospital Events   .    Consults:  .    Procedures:  .   Significant Diagnostic Tests:  Marland Kitchen    Micro Data:  .    Antimicrobials:  .   Interval History/Subjective  Hypotensive per RN.  Objective   Vitals:  Vitals:   06/24/19 2035 06/25/19 0436  BP: (!) 104/51 (!) 108/54  Pulse: 60 60  Resp: 14 10  Temp: 97.7 F (36.5 C) (!) 97.4 F (36.3 C)  SpO2: 99% 100%    Exam:  Constitutional.  Appears calm, uncomfortable, chronically ill, acutely ill but not toxic. Psychiatric.  Opens eyes, follows simple commands, answer simple questions.  Oriented to self, [facility], month, "2021".  "I am in the hospital for my legs". Cardiovascular.  Regular rate and rhythm.  No murmur, rub or gallop.  No upper or lower extremity edema.  Telemetry paced rhythm. Respiratory.  Clear to auscultation bilaterally.  No wheezes, rales or rhonchi.  Normal respiratory effort.  SPO2 100 percent on 6 L nasal cannula. Abdomen.  Soft. Skin.  Grossly unremarkable.  Feet appear well perfused. ENT.  Grossly normal hearing.  I have personally reviewed the following:   Today's Data  . Minimal urine output, 100+1 void.  BM x1. . BUN up to 50, creatinine worsening, 3.51. Marland Kitchen Hemoglobin stable at 7.5.  WBC trending down, 13.2.  Platelets with significant decline, now 98. .   Scheduled Meds: . carvedilol  3.125 mg Oral BID WC   Continuous Infusions:  Principal Problem:   End-stage systolic heart failure, acute on chronic (HCC) Active Problems:   Nonischemic cardiomyopathy (HCC)   Acute on chronic combined systolic and diastolic CHF (congestive heart failure) (HCC)   PAF (paroxysmal atrial fibrillation) (HCC)   ICD (implantable cardioverter-defibrillator), dual, [facility] [employer]   Symptomatic anemia   Chronic  anticoagulation   NSVT (nonsustained ventricular tachycardia) (HCC)   CKD (chronic kidney disease) stage 4, GFR 15-29 ml/min (HCC)   Acute kidney injury superimposed on CKD (HCC)   Weakness generalized   Goals of care, counseling/discussion   DNR (do not resuscitate)   Dizziness   Hypotension   Other cirrhosis of liver (HCC)   Nausea   DNI (do not intubate)   End of life care   Acute on chronic respiratory failure with hypoxia ([place])   Cardiorenal syndrome   Acute hypoxemic respiratory failure ([place])   Thrombocytopenia ([place])   LOS: 6 days   How to contact the [facility] Attending or Consulting provider 7A - 7P or covering provider during after hours 7P -7A, for this patient?  1. Check the care team in [facility] and look for a) attending/consulting TRH provider listed and b) the [facility] team listed 2. Log into [email or web address] and use [place]'s universal password to access. If you do not have the password, please contact the hospital operator. 3. Locate the [facility] provider you are looking for under [facility] and page to a number that you can be directly reached. 4. If you still have difficulty reaching the provider, please page the [facility] (Director on Call) for the Hospitalists listed on amion for assistance.

## 2019-06-26 NOTE — TOC Progression Note (Signed)
Transition of Care [facility]) - Progression Note    Patient Details  Name: Stephanie Yates MRN: [id number] Date of Birth: 30-Jul-1945  Transition of Care [facility]) CM/SW Contact  Eileen Stanford, LCSW Phone Number: 06/26/2019, 11:55 AM  Clinical Narrative:   Family chooses [employer] [facility]. Referral made.         Expected Discharge Plan and Services                                                 Social Determinants of Health (SDOH) Interventions    Readmission Risk Interventions Readmission Risk Prevention Plan 06/05/2019 05/27/2019 05/10/2019  Transportation Screening Complete Complete Complete  PCP or Specialist Appt within 3-5 Days - - -  HRI or [place] for [place] - - -  Medication Review Press photographer) Complete Complete Complete  Med Review Comments - - -  PCP or Specialist appointment within 3-5 days of discharge Complete Complete Complete  HRI or Home Care Consult Complete Complete Complete  SW Recovery Care/Counseling Consult Complete Complete Complete  Palliative Care Screening Not Applicable Not Applicable Not Applicable  Skilled Nursing Facility Complete Not Applicable Not Applicable  Some recent data might be hidden

## 2019-06-26 NOTE — Progress Notes (Signed)
[employer]  Received request from [facility] for family interest in [place]. Chart reviewed and attempted contact with daughter several times. Left voice message requesting call back. [place] room is available 06/27/2019 if daughter is agreeable and available to complete paperwork. Will continue to attempt contact in the morning. TOC manager aware. Appreciate report from bedside RN.   Thank you,  Erling Conte, LCSW [phone]

## 2019-06-26 NOTE — Progress Notes (Signed)
PROGRESS NOTE  Stephanie Yates JSE:[id number] DOB: [birth year] DOA: 06/18/2019 PCP: Josetta Huddle, MD  Brief History   64 B Fem recently hospitalized 12/13-12/21, presented 12/20 with lethargy, decreased eating, decreased strength and increased confusion, admitted for acute on chronic CHF complicated by acute on chronic CKD, hypertension and hepatic encephalopathy requiring lactulose.  Hospital course complicated by persistent hypotension limiting diuresis and worsening renal function.  Daughter requested 48-hour window to see if patient responded to medical and treatment without escalation of care.  12/26 patient reporting worsening abdominal pain, treated with Lasix.  Had sudden respiratory distress 12/26 with hemoptysis requiring nonrebreather.  Chest x-ray consistent with pulmonary edema.  Patient made DNR/DNI, family did not want escalate to BiPAP ideation.  Discussion between Dr. Lonny Prude and daughter, patient was transitioned to comfort measures 12/26.  A & P  Acute on chronic end-stage combined systolic/diastolic CHF, nonischemic cardiomyopathy, LVEF 10-15% with associated acute hypoxic respiratory failure PMH VT status post AICD 2014, biventricular PPM.  Per chart not a candidate for advanced CHF therapies based on last cardiology evaluation 12/18.  Diuresis limited secondary to hypotension.  --Poor response to diuretics, now with worsening renal function consistent with cardiorenal syndrome. --SPO2 stable /nasal cannula--looks overall stable at this time  AKI superimposed on CKD stage IV, cardiorenal syndrome --Creatinine rose with diuretics, urine output was poor, not responding well to diuretics. --Worsening now secondary to cardiorenal syndrome.  Thrombocytopenia --Not on any heparin or Lovenox.  Etiology unclear.  Suspect secondary to end-stage disease.  No signs or symptoms of infection.  Hemoptysis --No recurrence, this occurred approximately 4 days ago.  No further evaluation  suggested at this point.  Continue to monitor.  Persistent hypotension, orthostatics negative --Overall blood pressure has been stable with systolic blood pressure in the low 100s.  Cirrhosis, small volume ascites.   --No significant ascites on my exam  Paroxysmal atrial fibrillation --Rate controlled.  Rhythm paced.  Continue amiodarone, mexiletine  Microcytic anemia, with rectal bleeding earlier in hospitalization.  Did get 1 unit PRBC 12/24.  Eliquis was discontinued.  Was seen by GI 11/30, no further evaluation suggested. --Some small amount of vaginal bleeding noted by RN yesterday.  No evidence of gross bleeding.  Hemoglobin remains in low 7 range --No further evaluation suggested at this time.  Leukocytosis.  Present since at least May 23, 2019.  As high as 24.2 during previous hospitalization.  Etiology unclear. --No evidence of infection. Labs in am  Complex patient with end-stage systolic heart failure, not a candidate for advanced therapies, previous recommendations from cardiology have been for hospice.  She is worsening acute kidney injury secondary to cardiorenal syndrome, has been poorly responsive to diuretics, has worsening hypoxic respiratory failure and now is developing thrombocytopenia.  Although blood pressure and oxygenation is currently stable, I suspect prognosis less than 2 weeks, perhaps days.  I discussed with palliative medicine, she has discussed with daughter, concur with transition to full comfort care and consideration of residential hospice.  Turn off ICD. When I discussed with daughter yesterday, she was agreeable to comfort care for the patient did not improve, which is certainly the case.  DVT prophylaxis: SCDs Code Status: DNR/DNI Family Communication: Attempted to call daughter [phone] no answer left message Disposition Plan: await bed [facility]  Verneita Griffes, MD [facility] 12:42 PM  06/26/2019, 12:40 PM  LOS: 7 days    Interval  History/Subjective   Awake but not really coherent keeps asking me to reposition her No pain  no fever  Objective   Vitals:  Vitals:   06/25/19 2100 06/26/19 0437  BP: (!) 111/54 (!) 119/56  Pulse: (!) 59 (!) 59  Resp: 12 17  Temp: 97.6 F (36.4 C) 97.6 F (36.4 C)  SpO2: 100% 100%    Exam:  Ill-appearing African-American female S1-S2 no murmur telemetry sinus rhythm with some PVCs Chest is clear without added sound no rales no rhonchi Abdomen soft nontender no rebound no guarding No lower extremity edema ROM intact Follows commands I have personally reviewed the following:   Today's Data  . No labs today   Scheduled Meds:  Continuous Infusions:  Principal Problem:   End-stage systolic heart failure, acute on chronic (HCC) Active Problems:   Nonischemic cardiomyopathy (HCC)   Acute on chronic combined systolic and diastolic CHF (congestive heart failure) (HCC)   PAF (paroxysmal atrial fibrillation) (HCC)   ICD (implantable cardioverter-defibrillator), dual, [facility] [employer]   Symptomatic anemia   Chronic anticoagulation   NSVT (nonsustained ventricular tachycardia) (HCC)   CKD (chronic kidney disease) stage 4, GFR 15-29 ml/min (HCC)   Acute kidney injury superimposed on CKD (HCC)   Weakness generalized   Goals of care, counseling/discussion   DNR (do not resuscitate)   Dizziness   Hypotension   Other cirrhosis of liver (HCC)   Nausea   DNI (do not intubate)   End of life care   Acute on chronic respiratory failure with hypoxia ([place])   Cardiorenal syndrome   Acute hypoxemic respiratory failure (HCC)   Thrombocytopenia (HCC)   Shortness of breath   LOS: 7 days   How to contact the [facility] Attending or Consulting provider 7A - 7P or covering provider during after hours 7P -7A, for this patient?  1. Check the care team in [facility] and look for a) attending/consulting TRH provider listed and b) the [facility] team listed 2. Log into [email or web address] and use [place]'s universal  password to access. If you do not have the password, please contact the hospital operator. 3. Locate the [facility] provider you are looking for under [facility] and page to a number that you can be directly reached. 4. If you still have difficulty reaching the provider, please page the [facility] (Director on Call) for the Hospitalists listed on amion for assistance.

## 2019-06-27 MED ORDER — MORPHINE SULFATE (PF) 2 MG/ML IV SOLN: 1.0000 mg | INTRAVENOUS | 0 refills | Status: AC | PRN

## 2019-06-27 MED ORDER — LORAZEPAM 2 MG/ML IJ SOLN: 1.0000 mg | Freq: Four times a day (QID) | INTRAMUSCULAR | 0 refills | Status: AC | PRN

## 2019-06-27 NOTE — TOC Transition Note (Addendum)
Transition of Care [facility]) - CM/SW Discharge Note   Patient Details  Name: Stephanie Yates MRN: [id number] Date of Birth: [birth year]  [facility]) CM/SW Contact:  Eileen Stanford, LCSW Phone Number: 06/27/2019, 10:47 AM   Clinical Narrative: Clinical Social Worker facilitated patient discharge including contacting patient family and facility to confirm patient discharge plans.  Clinical information faxed to facility and family agreeable with plan.  CSW arranged ambulance transport via [facility] to [facility].  RN to call [phone] for report prior to d/c. RN PLEASE CONTACT DAUGHTER WHEN [facility] ARRIVES- PER DAUGHTERS REQUEST.    Final next level of care: [place] Barriers to Discharge: No Barriers Identified   Patient Goals and CMS Choice        Discharge Placement              Patient chooses bed at: [facility]) Patient to be transferred to facility by: [place] Name of family member notified: Leslie-daughter Patient and family notified of of transfer: 06/27/19  Discharge Plan and Services                                     Social Determinants of Health ([place]) Interventions     Readmission Risk Interventions Readmission Risk Prevention Plan 06/05/2019 05/27/2019 05/10/2019  Transportation Screening Complete Complete Complete  PCP or Specialist Appt within 3-5 Days - - -  HRI or [place] Work Consult for [place] - - -  Medication Review Press photographer) Complete Complete Complete  Med Review Comments - - -  PCP or Specialist appointment within 3-5 days of discharge Complete Complete Complete  HRI or Home Care Consult Complete Complete Complete  SW Recovery Care/Counseling Consult Complete Complete Complete  Palliative Care Screening Not Applicable Not Applicable Not Applicable  Skilled Nursing Facility Complete Not Applicable Not Applicable  Some  recent data might be hidden

## 2019-06-27 NOTE — Progress Notes (Signed)
[place] room is available this morning. Paper work completed with daughter Lolita Patella this morning.   Daughter would like to be called when Corey Harold is called [phone].  Please send discharge summary to [phone].  RN please call report to [phone].  Thank you,  Erling Conte, LCSW [phone]

## 2019-06-27 NOTE — Progress Notes (Signed)
Per pharmacy, wasted 1mg  morphine with Tula Nakayama, RN after patient left unit.  Witnessed waste-1mg  Tula Nakayama, RN

## 2019-06-27 NOTE — Progress Notes (Signed)
Report given to [facility] place.

## 2019-06-27 NOTE — Discharge Summary (Signed)
Physician Discharge Summary  Stephanie Yates LYY:[id number] DOB: 02-20-1946 DOA: 06/18/2019  PCP: Josetta Huddle, MD  Admit date: 06/18/2019 Discharge date: 06/27/2019  Time spent: 23 minutes  Recommendations for Outpatient Follow-up:  1. Patient discharging to [facility] for end-of-life care  Discharge Diagnoses:  Principal Problem:   End-stage systolic heart failure, acute on chronic (HCC) Active Problems:   Nonischemic cardiomyopathy (HCC)   Acute on chronic combined systolic and diastolic CHF (congestive heart failure) (HCC)   PAF (paroxysmal atrial fibrillation) (HCC)   ICD (implantable cardioverter-defibrillator), dual, [facility]   Symptomatic anemia   Chronic anticoagulation   NSVT (nonsustained ventricular tachycardia) (HCC)   CKD (chronic kidney disease) stage 4, GFR 15-29 ml/min (HCC)   Acute kidney injury superimposed on CKD (HCC)   Weakness generalized   Goals of care, counseling/discussion   DNR (do not resuscitate)   Dizziness   Hypotension   Other cirrhosis of liver (HCC)   Nausea   DNI (do not intubate)   End of life care   Acute on chronic respiratory failure with hypoxia ([place])   Cardiorenal syndrome   Acute hypoxemic respiratory failure (HCC)   Thrombocytopenia (HCC)   Shortness of breath   Discharge Condition: Guarded  Diet recommendation: Comfort related  Filed Weights   06/25/19 0436 06/26/19 0437 06/27/19 0405  Weight: 59.3 kg 57.4 kg 58.5 kg    History of present illness:  73 year old black female recently hospitalized 12/13 through 12/21 3 presented with left artery decreased p.o. intake and acute on chronic CHF complicated by worsening azotemia Patient had persistent hypotension with diuresis-patient had a sudden worsening 12/26 respiratory distress and hemoptysis requiring family did not wish to escalate discussion between patient has been accepted to [facility] and goal is comfort we have discontinued various medications such as  amiodarone diuretics etc. etc. patient was transfused x1 this admission for low hemoglobin GI was consulted point evaluation given overall poor prognosis will be going to [facility] for end-of-life care   Discharge Exam: Vitals:   06/26/19 1936 06/27/19 0405  BP: 127/61 (!) 128/55  Pulse: 60 63  Resp: 17 16  Temp: (!) 97.5 F (36.4 C) 97.8 F (36.6 C)  SpO2: 97% 95%    General: Awake alert but not coherent and somewhat confused on oxygen Cardiovascular: S1-S2 no murmur slight tachycardia Respiratory: Clinically clear no rales no rhonchi  abdomen soft  Discharge Instructions   Discharge Instructions    Diet - low sodium heart healthy   Complete by: As directed    Increase activity slowly   Complete by: As directed      Allergies as of 06/27/2019      Reactions   Strawberry Extract Hives, Itching, Swelling         Medication List    STOP taking these medications   amiodarone 200 MG tablet Commonly known as: Pacerone   apixaban 2.5 MG Tabs tablet Commonly known as: ELIQUIS   atorvastatin 40 MG tablet Commonly known as: LIPITOR   carvedilol 3.125 MG tablet Commonly known as: COREG   Ensure   ferrous gluconate 324 MG tablet Commonly known as: FERGON   lactulose 20 g packet Commonly known as: CEPHULAC   magnesium oxide 400 (241.3 Mg) MG tablet Commonly known as: MAGnesium-Oxide   meclizine 12.5 MG tablet Commonly known as: ANTIVERT   mexiletine 200 MG capsule Commonly known as: MEXITIL   ondansetron 4 MG disintegrating tablet Commonly known as: Zofran ODT   pantoprazole 40 MG tablet  Commonly known as: Protonix   polyethylene glycol 17 g packet Commonly known as: MIRALAX / GLYCOLAX   torsemide 20 MG tablet Commonly known as: DEMADEX     TAKE these medications   acetaminophen 500 MG tablet Commonly known as: TYLENOL Take 1,000 mg by mouth every 6 (six) hours as needed (pain).   albuterol 108 (90 Base) MCG/ACT inhaler Commonly known as:  VENTOLIN HFA Inhale 2 puffs into the lungs every 4 (four) hours as needed for wheezing or shortness of breath.   LORazepam 2 MG/ML injection Commonly known as: ATIVAN Inject 0.5 mLs (1 mg total) into the vein every 6 (six) hours as needed for anxiety.   morphine 2 MG/ML injection Inject 0.5 mLs (1 mg total) into the vein every hour as needed (dyspnea).      Allergies  Allergen Reactions  . Strawberry Extract Hives, Itching and Swelling           The results of significant diagnostics from this hospitalization (including imaging, microbiology, ancillary and laboratory) are listed below for reference.    Significant Diagnostic Studies: CT ABDOMEN PELVIS WO CONTRAST  Result Date: 06/14/2019 CLINICAL DATA:  Acute generalized abdominal pain. EXAM: CT ABDOMEN AND PELVIS WITHOUT CONTRAST TECHNIQUE: Multidetector CT imaging of the abdomen and pelvis was performed following the standard protocol without IV contrast. COMPARISON:  06/01/2019 FINDINGS: Lower chest: Mild bibasilar atelectasis. Hepatobiliary: Findings suspicious for hepatic cirrhosis are again demonstrated. No liver masses identified on this unenhanced exam. Gallbladder is unremarkable. No evidence of biliary ductal dilatation. Pancreas: No mass or inflammatory process visualized on this unenhanced exam. Spleen:  Within normal limits in size. Adrenals/Urinary tract: Stable 3.5 cm low-attenuation right adrenal mass, consistent with benign adenoma. A few tiny less than 5 mm renal calculi are again seen bilaterally, however there is no evidence of ureteral calculi or hydronephrosis. Unremarkable unopacified urinary bladder. Stomach/Bowel: No evidence of obstruction, inflammatory process, or abnormal fluid collections. Diverticulosis is seen mainly involving the descending and sigmoid colon, however there is no evidence of diverticulitis. Vascular/Lymphatic: No pathologically enlarged lymph nodes identified. No evidence of abdominal aortic  aneurysm. Aortic atherosclerosis incidentally noted. Reproductive: Small uterine fibroids some of which are calcified are again noted. Adnexal regions are unremarkable. Other: Diffuse mesenteric and body wall edema, and mild ascites, have mildly increased since previous study. Musculoskeletal:  No suspicious bone lesions identified. IMPRESSION: Mild increase in mild ascites and diffuse mesenteric and body wall edema since prior study. No focal inflammatory process or abscess identified. Probable hepatic cirrhosis. Colonic diverticulosis, without radiographic evidence of diverticulitis. Bilateral nephrolithiasis. No evidence of ureteral calculi or hydronephrosis. Stable small uterine fibroids. Stable benign right adrenal adenoma. Aortic Atherosclerosis (ICD10-I70.0). Electronically Signed   By: Marlaine Hind M.D.   On: 06/14/2019 18:21   CT ABDOMEN PELVIS WO CONTRAST  Result Date: 06/01/2019 CLINICAL DATA:  Abdominal pain.  Concern for diverticulitis. EXAM: CT ABDOMEN AND PELVIS WITHOUT CONTRAST TECHNIQUE: Multidetector CT imaging of the abdomen and pelvis was performed following the standard protocol without IV contrast. COMPARISON:  05/26/2019 FINDINGS: The lack of intravenous contrast limits the ability to evaluate solid abdominal organs. Lower chest: Limited visualization of lower thorax demonstrates minimal bibasilar heterogeneous opacities, left greater than right, improved compared to the 11/29 examination. No pleural effusion. Cardiomegaly. Pacer leads again terminate within the right atrium and ventricle. No pericardial effusion. Hepatobiliary: Nodularity hepatic contour. There is diffuse decreased attempt increased attenuation of the hepatic parenchyma as could be seen in the setting of chronic amiodarone  therapy. Trace amount of perihepatic ascites, unchanged to slightly progressed compared to the 05/26/2019 examination. Normal noncontrast appearance of the gallbladder. No radiopaque gallstones.  Pancreas: Normal noncontrast appearance of the pancreas. Spleen: Normal noncontrast appearance of the spleen. Adrenals/Urinary Tract: The bilateral kidneys again appear atrophic. Punctate nonobstructing renal stones are again seen bilaterally with dominant nonobstructing stone within the inferior pole the right kidney measuring approximately 4 mm in diameter (image 38, series 3) and dominant nonobstructing stone within the inferior pole the left kidney measuring approximately 2 mm (image 36, series 3). No renal stones are seen along the expected course of either ureter. Excreted contrast from previous contrast enhance head and neck CTA is seen within the urinary bladder. No urinary obstruction or perinephric stranding. Redemonstrated approximately 3.8 x 1.9 cm hypoattenuating right-sided adrenal adenoma. Normal noncontrast appearance of the left adrenal gland. Stomach/Bowel: Ingested enteric contrast extends to the level of the hepatic flexure of the colon. Redemonstrated small right-sided inguinal hernia which is noted to contain a short-segment of nondilated small bowel (representative coronal images 39 through 8, series 6), not resulting in enteric obstruction. Moderate colonic stool burden. Scattered colonic diverticulosis without evidence of superimposed acute diverticulitis. Normal appearance of the terminal ileum. The appendix is not visualized, however there is no definitive pericecal inflammatory change on this noncontrast examination. No pneumoperitoneum, pneumatosis or portal venous gas. Vascular/Lymphatic: Atherosclerotic plaque within normal caliber abdominal aorta. No bulky retroperitoneal, mesenteric, pelvic or inguinal lymphadenopathy. Reproductive: Dystrophic calcifications, presumed degenerating fibroids within and expectedly atrophic uterus. No discrete adnexal lesion. Trace amount of fluid in the pelvic cul-de-sac. Other: Moderate amount of diffuse body wall anasarca potentially secondary to  third spacing. Musculoskeletal: No acute or aggressive osseous abnormalities. Grade 1 anterolisthesis of L4 upon L5 and L5 upon S1 without associated pars defects. A bone island is noted within the left acetabulum. IMPRESSION: 1. No explanation for patient's abdominal pain. Specifically, no evidence enteric obstruction or diverticulitis on this noncontrast examination. 2. Redemonstrated right inguinal hernia containing nondilated loops of small bowel and not resulting in enteric obstruction. 3. Nonobstructive bilateral nephrolithiasis unchanged. 4. Cardiomegaly with diffuse body wall anasarca as could be seen in the setting early CHF. Clinical correlation is advised. 5. Suspected early cirrhotic change with minimal amount perihepatic ascites. 6.  Aortic Atherosclerosis (ICD10-I70.0). Electronically Signed   By: Sandi Mariscal M.D.   On: 06/01/2019 12:08   CT ANGIO HEAD W OR WO CONTRAST  Result Date: 05/31/2019 CLINICAL DATA:  Left-sided weakness EXAM: CT ANGIOGRAPHY HEAD AND NECK TECHNIQUE: Multidetector CT imaging of the head and neck was performed using the standard protocol during bolus administration of intravenous contrast. Multiplanar CT image reconstructions and MIPs were obtained to evaluate the vascular anatomy. Carotid stenosis measurements (when applicable) are obtained utilizing NASCET criteria, using the distal internal carotid diameter as the denominator. CONTRAST:  157mL OMNIPAQUE IOHEXOL 350 MG/ML SOLN COMPARISON:  None. FINDINGS: CTA NECK FINDINGS Aortic arch: Great vessel origins are patent. Right carotid system: Common, internal, and external carotid arteries are patent. There is mild calcified plaque along the common carotid. Mild calcified plaque is present at the ICA origin, noting streak artifact through this region. There is no hemodynamically significant stenosis or evidence of dissection. Left carotid system: Common, internal, and external carotid arteries are patent. Mild calcified plaque  is present along the common carotid. There is primarily calcified plaque at the bifurcation and ICA origin causing minimal stenosis, noting streak artifact through this region. There is no hemodynamically significant stenosis or  evidence of dissection. Vertebral arteries: Dominant right vertebral artery is patent. Calcified plaque at its origin results in mild stenosis. Congenitally small caliber left vertebral artery is patent. Skeleton: Advanced degenerative changes of the cervical spine particularly from C4-C5 through C6-C7. Other neck: No neck mass or adenopathy. Diffuse paranasal sinus opacification with areas of hyperdensity likely reflecting inspissation with fungal colonization not excluded. Upper chest: No apical lung mass. Review of the MIP images confirms the above findings CTA HEAD FINDINGS Anterior circulation: Intracranial internal carotid arteries are patent with calcified plaque along cavernous and paraclinoid portions causing mild stenosis. Anterior and middle cerebral arteries are patent. Posterior circulation: Intracranial vertebral arteries are patent. The left vertebral artery terminates as a PICA. Basilar artery is patent. Posterior cerebral arteries are patent. Posterior communicating arteries are present. Venous sinuses: Patent as permitted by contrast timing Anatomic variants: Fetal origin of the right PCA. Review of the MIP images confirms the above findings IMPRESSION: No proximal intracranial vessel occlusion. No occlusion or hemodynamically significant stenosis in the neck. Electronically Signed   By: Macy Mis M.D.   On: 05/31/2019 15:56   CT Head Wo Contrast  Result Date: 06/18/2019 CLINICAL DATA:  Lethargic and not eating EXAM: CT HEAD WITHOUT CONTRAST TECHNIQUE: Contiguous axial images were obtained from the base of the skull through the vertex without intravenous contrast. COMPARISON:  06/04/2019 FINDINGS: Brain: There is no acute intracranial hemorrhage, mass-effect, or  edema. Gray-white differentiation is preserved. There is no extra-axial fluid collection. Ventricles and sulci are within normal limits in size and configuration. Patchy hypoattenuation in the supratentorial white matter is nonspecific but likely reflects stable chronic microvascular ischemic changes. Vascular: There is atherosclerotic calcification at the skull base. Skull: Calvarium is unremarkable. Sinuses/Orbits: Persistent paranasal sinus opacification with areas of high density likely reflecting inspissation. Orbits are unremarkable. Other: None. IMPRESSION: No acute intracranial abnormality. Stable chronic findings detailed above. Electronically Signed   By: Macy Mis M.D.   On: 06/18/2019 13:45   CT HEAD WO CONTRAST  Result Date: 06/04/2019 CLINICAL DATA:  Left-sided weakness EXAM: CT HEAD WITHOUT CONTRAST TECHNIQUE: Contiguous axial images were obtained from the base of the skull through the vertex without intravenous contrast. COMPARISON:  06/01/2019 FINDINGS: Brain: There is no acute intracranial hemorrhage, mass-effect, or edema. There is no new loss of gray-white differentiation. Patchy hypoattenuation in the supratentorial white matter is nonspecific but likely reflects stable chronic microvascular ischemic changes. There is no extra-axial fluid collection. Ventricles and sulci are stable in size and configuration. Vascular: There is atherosclerotic calcification at the skull base. Skull: Calvarium is unremarkable. Sinuses/Orbits: Persistent diffuse polypoid mucosal thickening with areas of increased density likely reflecting inspissation. Other: None. IMPRESSION: No significant change since 06/01/2019. Electronically Signed   By: Macy Mis M.D.   On: 06/04/2019 14:21   CT HEAD WO CONTRAST  Result Date: 06/01/2019 CLINICAL DATA:  Encephalopathy. Follow-up for question of left basal ganglia region stroke. EXAM: CT HEAD WITHOUT CONTRAST TECHNIQUE: Contiguous axial images were obtained  from the base of the skull through the vertex without intravenous contrast. COMPARISON:  CT 05/31/2019, 05/30/2019 and 04/15/2019. FINDINGS: Brain: No acute finding. The brainstem and cerebellum appear normal. Cerebral hemispheres show mild chronic small-vessel ischemic change of the white matter. This includes an area of low-density in the anterior limb internal capsule on the left which was present in October in therefore not acute. No sign of acute infarction, mass lesion, hemorrhage, hydrocephalus or extra-axial collection. Vascular: There is atherosclerotic calcification of the  major vessels at the base of the brain. Skull: Negative Sinuses/Orbits: Widespread sinus opacification consistent with rhinosinusitis. Orbits negative. Other: None IMPRESSION: No acute intracranial finding. Chronic small-vessel ischemic changes of the white matter. Low-density in the anterior limb internal capsule on the left question as acute on the recent study was, I think, present on 04/15/2019 and therefore not acute. Widespread rhinosinusitis. Electronically Signed   By: Nelson Chimes M.D.   On: 06/01/2019 11:12   CT HEAD WO CONTRAST  Result Date: 05/30/2019 CLINICAL DATA:  Vertigo EXAM: CT HEAD WITHOUT CONTRAST TECHNIQUE: Contiguous axial images were obtained from the base of the skull through the vertex without intravenous contrast. COMPARISON:  04/15/2019 FINDINGS: Brain: No acute intracranial abnormality. Specifically, no hemorrhage, hydrocephalus, mass lesion, acute infarction, or significant intracranial injury. Vascular: No hyperdense vessel or unexpected calcification. Skull: No acute calvarial abnormality. Sinuses/Orbits: Diffuse disease throughout the paranasal sinuses with extensive mucosal thickening. Near complete opacification of the maxillary, frontal and sphenoid sinuses. Mastoid air cells clear. Other: None IMPRESSION: No acute intracranial abnormality. Severe chronic sinusitis. Electronically Signed   By: Rolm Baptise M.D.   On: 05/30/2019 21:40   CT ANGIO NECK W OR WO CONTRAST  Result Date: 05/31/2019 CLINICAL DATA:  Left-sided weakness EXAM: CT ANGIOGRAPHY HEAD AND NECK TECHNIQUE: Multidetector CT imaging of the head and neck was performed using the standard protocol during bolus administration of intravenous contrast. Multiplanar CT image reconstructions and MIPs were obtained to evaluate the vascular anatomy. Carotid stenosis measurements (when applicable) are obtained utilizing NASCET criteria, using the distal internal carotid diameter as the denominator. CONTRAST:  134mL OMNIPAQUE IOHEXOL 350 MG/ML SOLN COMPARISON:  None. FINDINGS: CTA NECK FINDINGS Aortic arch: Great vessel origins are patent. Right carotid system: Common, internal, and external carotid arteries are patent. There is mild calcified plaque along the common carotid. Mild calcified plaque is present at the ICA origin, noting streak artifact through this region. There is no hemodynamically significant stenosis or evidence of dissection. Left carotid system: Common, internal, and external carotid arteries are patent. Mild calcified plaque is present along the common carotid. There is primarily calcified plaque at the bifurcation and ICA origin causing minimal stenosis, noting streak artifact through this region. There is no hemodynamically significant stenosis or evidence of dissection. Vertebral arteries: Dominant right vertebral artery is patent. Calcified plaque at its origin results in mild stenosis. Congenitally small caliber left vertebral artery is patent. Skeleton: Advanced degenerative changes of the cervical spine particularly from C4-C5 through C6-C7. Other neck: No neck mass or adenopathy. Diffuse paranasal sinus opacification with areas of hyperdensity likely reflecting inspissation with fungal colonization not excluded. Upper chest: No apical lung mass. Review of the MIP images confirms the above findings CTA HEAD FINDINGS Anterior  circulation: Intracranial internal carotid arteries are patent with calcified plaque along cavernous and paraclinoid portions causing mild stenosis. Anterior and middle cerebral arteries are patent. Posterior circulation: Intracranial vertebral arteries are patent. The left vertebral artery terminates as a PICA. Basilar artery is patent. Posterior cerebral arteries are patent. Posterior communicating arteries are present. Venous sinuses: Patent as permitted by contrast timing Anatomic variants: Fetal origin of the right PCA. Review of the MIP images confirms the above findings IMPRESSION: No proximal intracranial vessel occlusion. No occlusion or hemodynamically significant stenosis in the neck. Electronically Signed   By: Macy Mis M.D.   On: 05/31/2019 15:56   CARDIAC CATHETERIZATION  Result Date: 05/30/2019 Findings: RA = 8 RV = 58/12 PA = 67/13 (41) PCW =  24 Fick cardiac output/index = 6.4/4.0 PVR = 2.6 WU Ao sat = 99% PA sat = 67%, 68% High SVC sat = 69% Assessment: 1. Moderately elevated filling pressures with normal cardiac output Plan/Discussion: Continue IV diuresis. Given normal cardiac output would likely not benefit from inotropic support. Glori Bickers, MD 8:49 AM  US Abdomen Limited  Result Date: 06/19/2019 CLINICAL DATA:  Cirrhosis.  Abdominal distention. EXAM: LIMITED ABDOMEN ULTRASOUND FOR ASCITES TECHNIQUE: Limited ultrasound survey for ascites was performed in all four abdominal quadrants. COMPARISON:  Ultrasound 06/05/2019.  CT 06/14/2019. FINDINGS: A small amount of ascites is noted, greatest in the left upper quadrant. Overall volume appears similar to the recent previous studies. IMPRESSION: Small amount of ascites, similar to recent prior studies. Electronically Signed   By: Richardean Sale M.D.   On: 06/19/2019 18:22   US Abdomen Limited  Result Date: 06/05/2019 CLINICAL DATA:  Ascites EXAM: LIMITED ABDOMEN ULTRASOUND FOR ASCITES TECHNIQUE: Limited ultrasound survey for  ascites was performed in all four abdominal quadrants. COMPARISON:  CT abdomen pelvis 06/01/2019 FINDINGS: Small volume ascites identified in the 4 quadrants. IMPRESSION: Scattered small volume ascites. Electronically Signed   By: Lavonia Dana M.D.   On: 06/05/2019 13:15   DG CHEST PORT 1 VIEW  Result Date: 06/24/2019 CLINICAL DATA:  Shortness of breath. EXAM: PORTABLE CHEST 1 VIEW COMPARISON:  Single-view of the chest 06/22/2019 and 06/18/2019. FINDINGS: There is cardiomegaly and interstitial pulmonary edema. No pneumothorax or pleural effusion. AICD is in place. Atherosclerosis noted. IMPRESSION: No marked change in cardiomegaly and pulmonary edema. Atherosclerosis. Electronically Signed   By: Inge Rise M.D.   On: 06/24/2019 13:10   DG CHEST PORT 1 VIEW  Result Date: 06/22/2019 CLINICAL DATA:  73 year old female with hemoptysis EXAM: PORTABLE CHEST 1 VIEW COMPARISON:  Prior chest x-ray 06/18/2019 FINDINGS: Stable left subclavian approach cardiac rhythm maintenance device with leads projecting over the right heart and right ventricle. Marked cardiomegaly again noted. Increased pulmonary vascular congestion now with mild interstitial edema. No pneumothorax. No acute osseous abnormality. IMPRESSION: Worsening pulmonary vascular congestion now with mild interstitial edema. Findings are consistent with mild-moderate CHF. Stable cardiomegaly. Electronically Signed   By: Jacqulynn Cadet M.D.   On: 06/22/2019 14:58   DG Chest Portable 1 View  Result Date: 06/18/2019 CLINICAL DATA:  Discharge from the hospital yesterday. Family reports patient is lethargic and not eating. Slow to respond. EXAM: PORTABLE CHEST 1 VIEW COMPARISON:  06/09/2019. FINDINGS: Enlarged cardiopericardial silhouette, stable from the prior study. Stable left anterior chest wall AICD. No mediastinal or hilar masses. Prominent bronchovascular markings most evident in the bases. No evidence of pneumonia or pulmonary edema. No  pleural effusion or pneumothorax. Skeletal structures are grossly intact. IMPRESSION: No acute cardiopulmonary disease. Electronically Signed   By: Lajean Manes M.D.   On: 06/18/2019 14:14   DG Chest Port 1 View  Result Date: 06/09/2019 CLINICAL DATA:  Pt unable to answer hx questions. Only moaned when Tech questioned her. Per nurse: Pt from [place] [facility] [facility]. Reports difficulty breathing since 1100 this AM. Lethargic and SOB. 100% on RA, wheezing heard. 5 mg or albuterol given en route. EXAM: PORTABLE CHEST 1 VIEW COMPARISON:  Chest radiograph 06/05/2019 FINDINGS: Stable cardiomediastinal contours with enlarged heart size. Left ICD in place. Central pulmonary vascular congestion. Diffuse mild bilateral interstitial opacities likely reflecting trace edema. No new focal consolidation. No pneumothorax or large pleural effusion. No acute finding in the visualized skeleton. IMPRESSION: Cardiomegaly with central pulmonary vascular congestion and mild  bilateral interstitial opacities likely reflecting edema. Electronically Signed   By: Audie Pinto M.D.   On: 06/09/2019 15:59   DG CHEST PORT 1 VIEW  Result Date: 06/05/2019 CLINICAL DATA:  Congestive heart failure. EXAM: PORTABLE CHEST 1 VIEW COMPARISON:  Single-view of the chest 05/31/2019. PA and lateral chest 05/26/2019. FINDINGS: There is cardiomegaly without edema. No consolidative process, pneumothorax or effusion. AICD is in place. IMPRESSION: Cardiomegaly without acute disease. Electronically Signed   By: Inge Rise M.D.   On: 06/05/2019 13:23   DG CHEST PORT 1 VIEW  Result Date: 05/31/2019 CLINICAL DATA:  Cough.  CHF. EXAM: PORTABLE CHEST 1 VIEW COMPARISON:  05/26/2019 FINDINGS: There is a left chest wall ICD with leads in the right atrial appendage and right ventricle. Cardiac enlargement. Aortic atherosclerosis. Pulmonary vascular congestion. No pleural effusion. No airspace opacities. IMPRESSION: 1. Similar appearance of cardiac  enlargement and pulmonary vascular congestion. Electronically Signed   By: Kerby Moors M.D.   On: 05/31/2019 19:42   EEG adult  Result Date: 06/07/2019 Lora Havens, MD     06/07/2019 10:45 AM Patient Name: Stephanie Yates MRN: [id number] Epilepsy Attending: Lora Havens Referring Physician/Provider: Dr Darliss Cheney Date: 06/07/2019 Duration: 22.81mins Patient history: 73yo M with sudden onset of slurred speech and left sided weakness. EEG to evaluate for seizure Level of alertness: awake AEDs during EEG study: None Technical aspects: This EEG study was done with scalp electrodes positioned according to the 10-20 International system of electrode placement. Electrical activity was acquired at a sampling rate of 500Hz  and reviewed with a high frequency filter of 70Hz  and a low frequency filter of 1Hz . EEG data were recorded continuously and digitally stored. DESCRIPTION: No clear posterior dominant rhythm was seen. EEG showed continuous generalized polymorphic 3-6hz  theta-delta slowing. Sharp transient was seen in right frontotemporal region. Hyperventilation and photic stimulation were not performed.  ABNORMALITY - Continuous slow, generalized  IMPRESSION: This study is suggestive of moderate diffuse encephalopathy, non specific to etiology. No seizures or definite epileptiform discharges were seen throughout the recording. If suspicion for interictal activity remains high, consider long term eeg monitoring. Lora Havens    EEG adult  Result Date: 05/31/2019 Lora Havens, MD     05/31/2019  4:41 PM Patient Name: Stephanie Yates MRN: [id number] Epilepsy Attending: Lora Havens Referring Physician/Provider: Dr Rosalin Hawking Date: 05/31/2019 Duration: 26.67mins Patient history: 73yo M with sudden onset of slurred speech and left sided weakness. EEG to evaluate for seizure Level of alertness: awake AEDs during EEG study: none Technical aspects: This EEG study was done with scalp electrodes  positioned according to the 10-20 International system of electrode placement. Electrical activity was acquired at a sampling rate of 500Hz  and reviewed with a high frequency filter of 70Hz  and a low frequency filter of 1Hz . EEG data were recorded continuously and digitally stored. DESCRIPTION: No clear posterior dominant rhythm was seen. EEG showed continuous generalized polymorphic 3-6hz  theta-delta slowing. At times, generalized triphasic waves were also noted. Hyperventilation and photic stimulation were not performed. ABNORMALITY - Continuous slow, generalized - Triphasic waves, generalized IMPRESSION: This study is suggestive of moderate diffuse encephalopathy, non specific to etiology, could be secondary to toxic-metabolic causes. No seizures or definite epileptiform discharges were seen throughout the recording. Priyanka Barbra Sarks   CT HEAD CODE STROKE WO CONTRAST`  Result Date: 05/31/2019 CLINICAL DATA:  Code stroke.  Left-sided weakness EXAM: CT HEAD WITHOUT CONTRAST TECHNIQUE: Contiguous axial images were obtained from  the base of the skull through the vertex without intravenous contrast. COMPARISON:  CT head 04/15/2019 FINDINGS: Brain: Ventricle size normal. Small hypodensity anterior limb internal capsule on the left unchanged from the prior study. Negative for acute infarct, hemorrhage, mass. No midline shift. Vascular: Negative for hyperdense vessel Skull: Negative Sinuses/Orbits: Extensive mucosal edema throughout the paranasal sinuses. Negative orbit. Other: None ASPECTS (Waller Stroke Program Early CT Score) - Ganglionic level infarction (caudate, lentiform nuclei, internal capsule, insula, M1-M3 cortex): 7 - Supraganglionic infarction (M4-M6 cortex): 3 Total score (0-10 with 10 being normal): 10 IMPRESSION: 1. No acute abnormality and no change from the prior study 2. ASPECTS is 10 Electronically Signed   By: Franchot Gallo M.D.   On: 05/31/2019 15:03    Microbiology: Recent Results (from  the past 240 hour(s))  SARS CORONAVIRUS 2 (TAT 6-24 HRS) Nasopharyngeal Nasopharyngeal Swab     Status: None   Collection Time: 06/18/19  5:58 PM   Specimen: Nasopharyngeal Swab  Result Value Ref Range Status   SARS Coronavirus 2 NEGATIVE NEGATIVE Final    Comment: (NOTE) SARS-CoV-2 target nucleic acids are NOT DETECTED. The SARS-CoV-2 RNA is generally detectable in upper and lower respiratory specimens during the acute phase of infection. Negative results do not preclude SARS-CoV-2 infection, do not rule out co-infections with other pathogens, and should not be used as the sole basis for treatment or other patient management decisions. Negative results must be combined with clinical observations, patient history, and epidemiological information. The expected result is Negative. Fact Sheet for Patients: SugarRoll[employer]be Fact Sheet for Healthcare Providers: https://www.woods-mathews.com/ This test is not yet approved or cleared by the [place] FDA and  has been authorized for detection and/or diagnosis of SARS-CoV-2 by FDA under an Emergency Use Authorization (EUA). This EUA will remain  in effect (meaning this test can be used) for the duration of the COVID-19 declaration under Section 56 4(b)(1) of the Act, 21 U.S.C. section 360bbb-3(b)(1), unless the authorization is terminated or revoked sooner. Performed at [facility], [place] [address] [place], [place] [address]      Labs: Basic Metabolic Panel: Recent Labs  Lab 06/21/19 0104 06/22/19 0459 06/22/19 1458 06/24/19 1030 06/25/19 0402  NA 139 143 137 133* 137  K 3.5 3.3* 3.6 3.5 4.0  CL 107 110 107 103 104  CO2 21* 23 22 22 24   GLUCOSE 103* 95 170* 120* 82  BUN 39* 35* 34* 43* 50*  CREATININE 2.29* 2.27* 2.29* 3.26* 3.51*  CALCIUM 8.1* 7.6* 8.2* 8.0* 8.1*   Liver Function Tests: No results for input(s): AST, ALT, ALKPHOS, BILITOT, PROT, ALBUMIN in the last 168  hours. No results for input(s): LIPASE, AMYLASE in the last 168 hours. No results for input(s): AMMONIA in the last 168 hours. CBC: Recent Labs  Lab 06/21/19 0104 06/22/19 0459 06/22/19 1447 06/24/19 1030 06/25/19 0402  WBC 12.1* 13.4* 19.7* 18.0* 13.2*  NEUTROABS  --   --   --   --  10.3*  HGB 9.3* 8.2* 9.5* 7.6* 7.5*  HCT 27.5* 24.4* 29.2* 22.7* 22.4*  MCV 77.0* 77.5* 79.8* 77.2* 77.5*  PLT 232 185 278 147* 98*   Cardiac Enzymes: No results for input(s): CKTOTAL, CKMB, CKMBINDEX, TROPONINI in the last 168 hours. BNP: BNP (last 3 results) Recent Labs    05/26/19 1822 06/09/19 1541 06/19/19 1634  BNP 1,587.2* 2,039.7* 1,324.0*    ProBNP (last 3 results) No results for input(s): PROBNP in the last 8760 hours.  CBG: No  results for input(s): GLUCAP in the last 168 hours.     Signed:  Nita Sells MD   [facility] 06/27/2019, 10:27 AM

## 2019-07-08 ENCOUNTER — Encounter: Payer: Self-pay | Admitting: Internal Medicine

## 2019-07-29 DEATH — deceased

## 2020-04-16 IMAGING — CT CT RENAL STONE PROTOCOL
2 of 4 series · 16 of 46 positions shown, 18 images · non-contrast
Comparison: 01/28/2019

CLINICAL DATA: Hematuria

EXAM:
CT ABDOMEN AND PELVIS WITHOUT CONTRAST
TECHNIQUE: Multidetector CT imaging of the abdomen and pelvis was performed
following the standard protocol without IV contrast.

[Series 3: renal stone 5.0 · axial · 0.69mm/px · z∈[+873,+1238]mm · 13 of 81 slices shown, 15 images]
[im 4/81  soft-tissue]
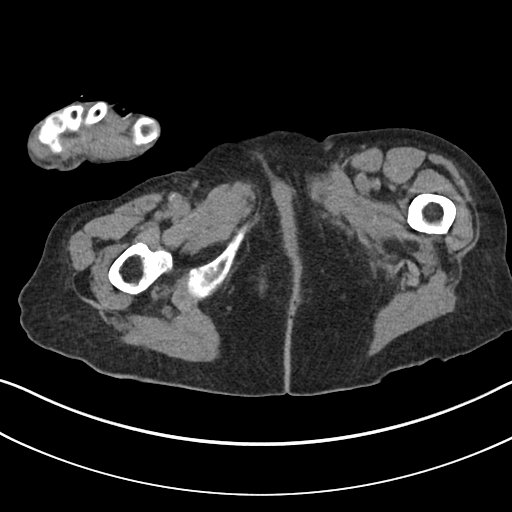
[im 4/81  bone]
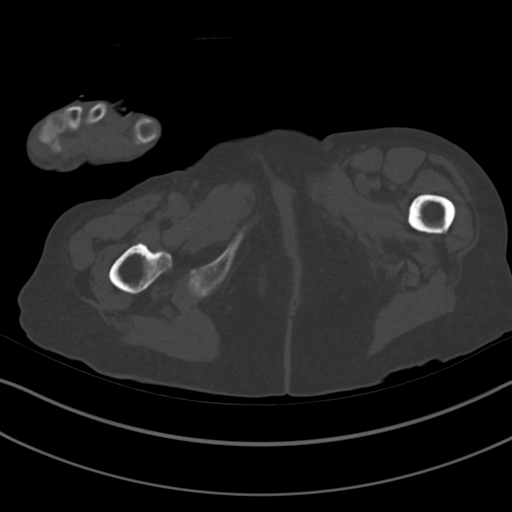
[im 12/81  soft-tissue]
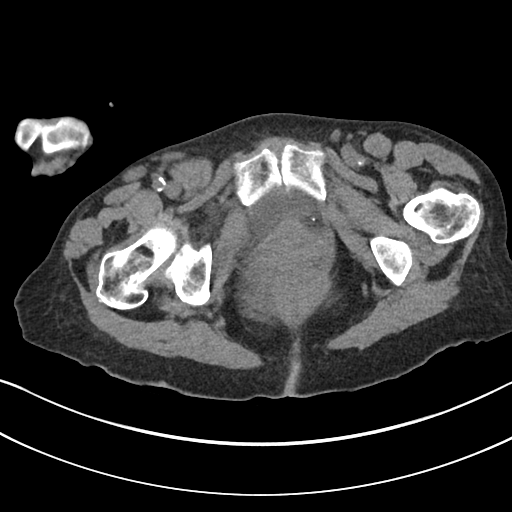
[im 16/81  soft-tissue]
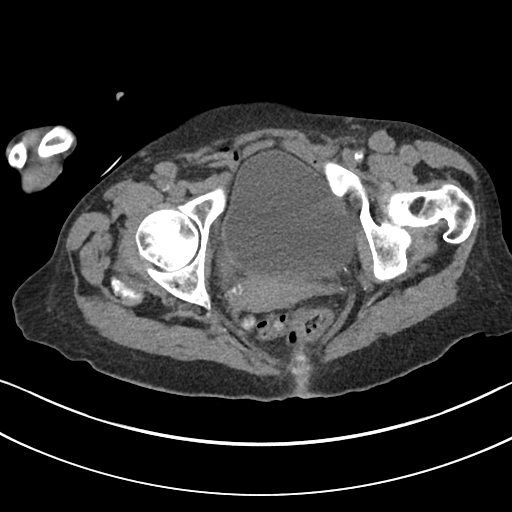
[im 23/81  soft-tissue]
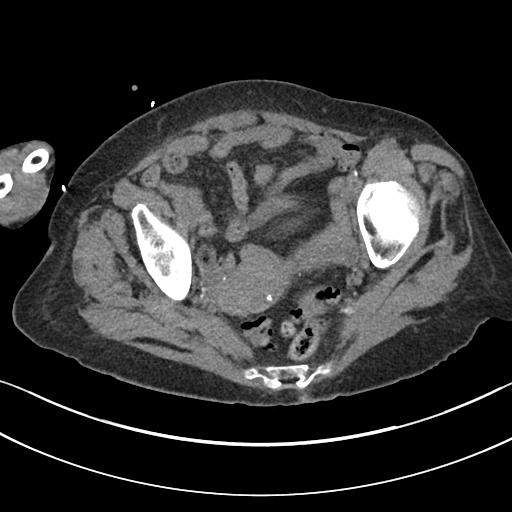
[im 27/81  soft-tissue]
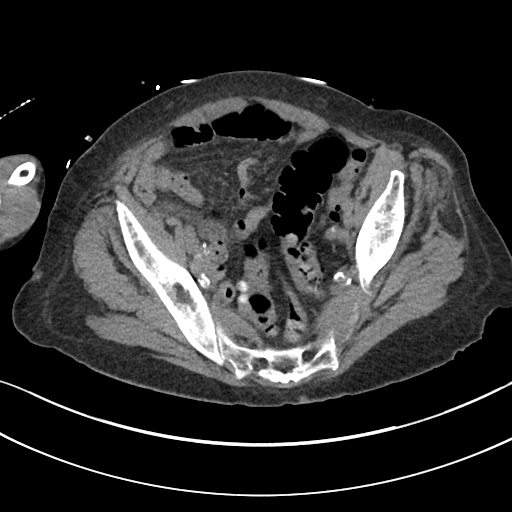
[im 35/81  soft-tissue]
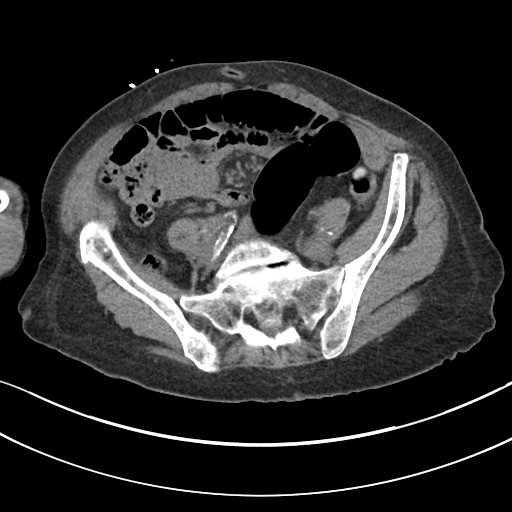
[im 42/81  soft-tissue]
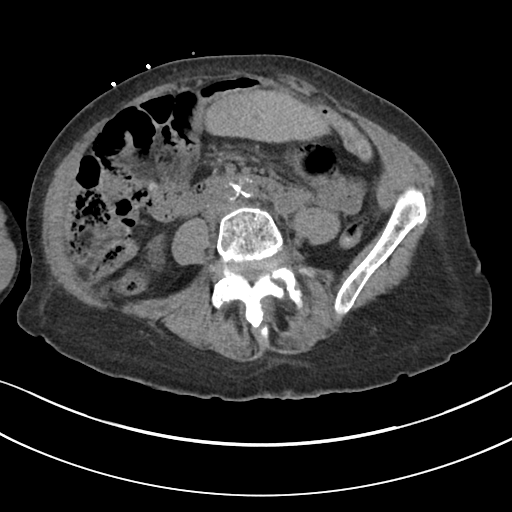
[im 46/81  soft-tissue]
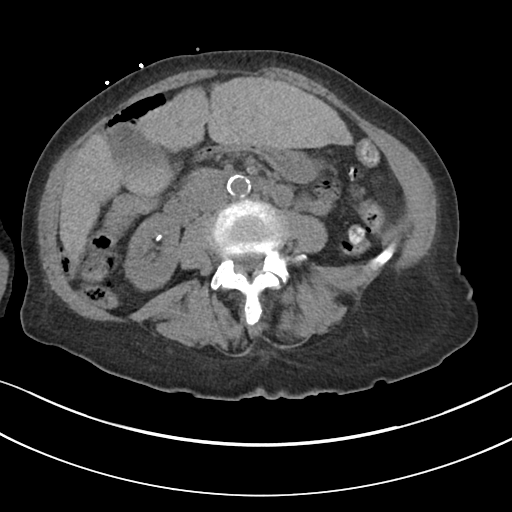
[im 54/81  soft-tissue]
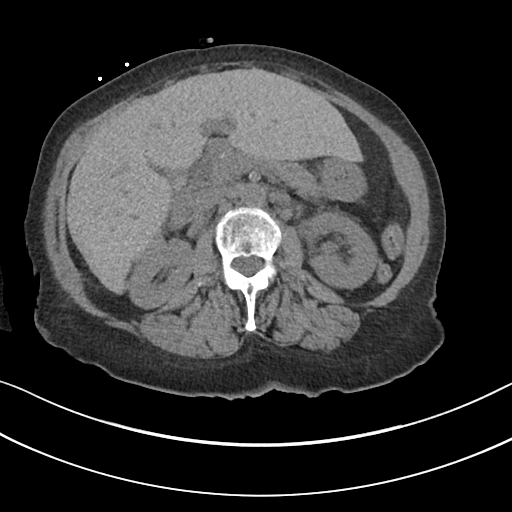
[im 54/81  bone]
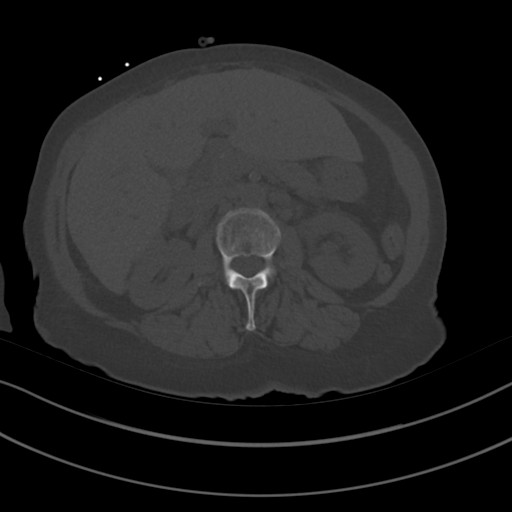
[im 58/81  soft-tissue]
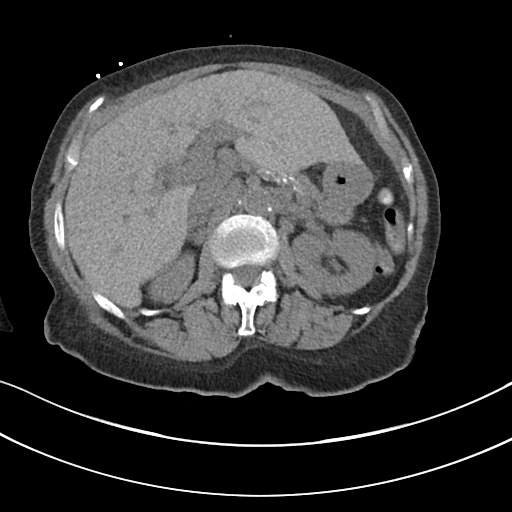
[im 65/81  soft-tissue]
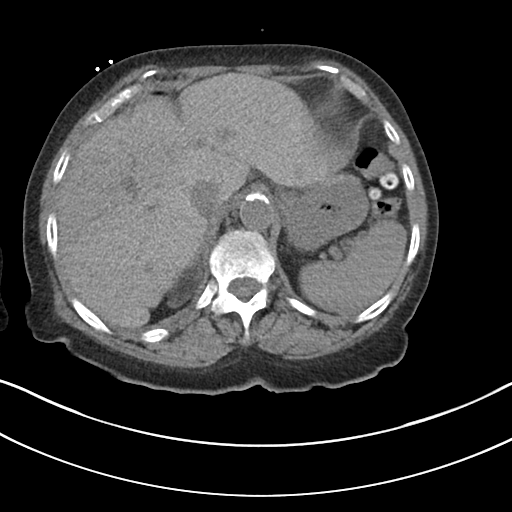
[im 69/81  soft-tissue]
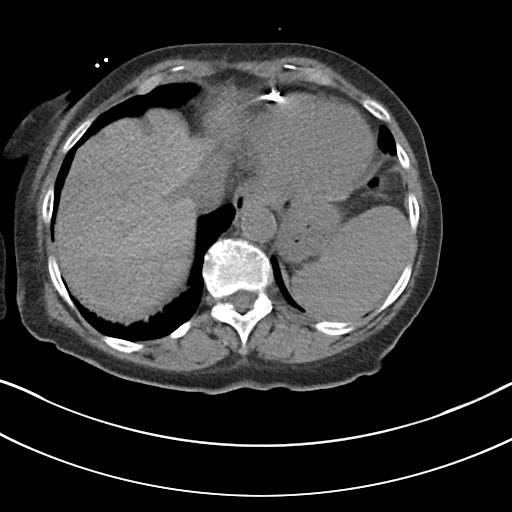
[im 77/81  soft-tissue]
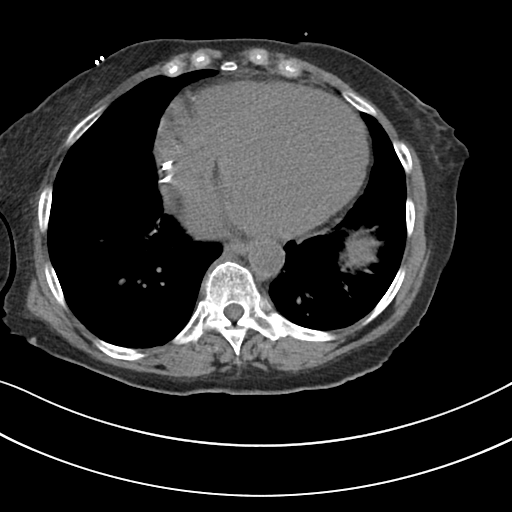

[Series 5: renal stone 3.0 cor · coronal · 0.60mm/px · 3 of 81 slices shown]
[im 27/81  soft-tissue]
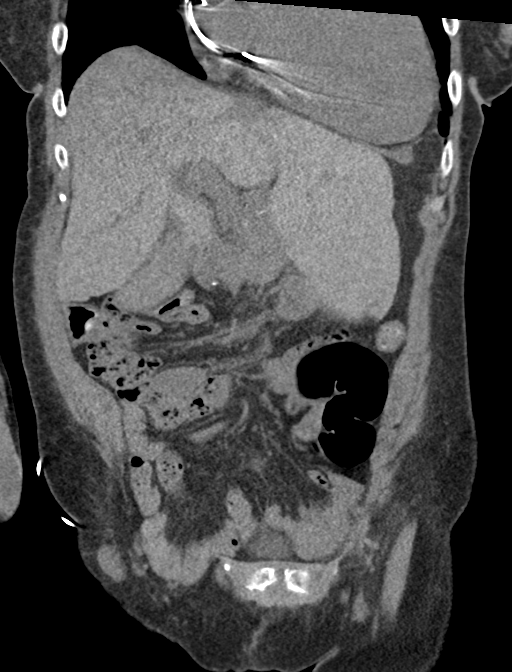
[im 36/81  soft-tissue]
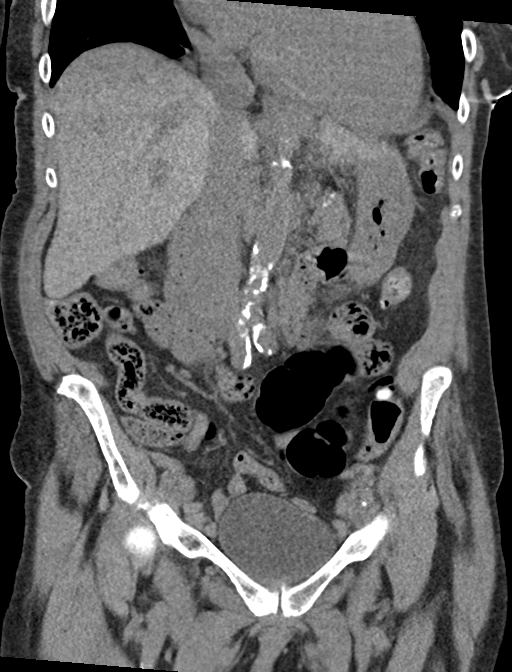
[im 45/81  soft-tissue]
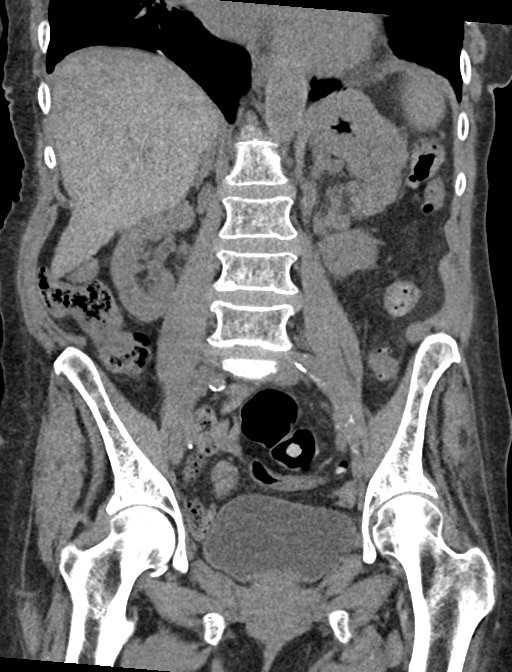

[16 of 46 positions shown; findings below may reference images not displayed]

FINDINGS: LOWER CHEST: Moderate cardiomegaly

HEPATOBILIARY: Mildly nodular hepatic contours with relative
hypertrophy of the caudate and left hepatic lobe, consistent with
hepatic cirrhosis. No focal liver lesion. No biliary dilatation.
Hyperdensity in the gallbladder may be vicarious excretion of
contrast related to a prior study.

PANCREAS: The pancreatic parenchymal contours are normal and there
is no ductal dilatation. There is no peripancreatic fluid
collection.

SPLEEN: Normal.

ADRENALS/URINARY TRACT:

--Adrenal glands: Unchanged right adrenal myelolipoma

--Right kidney/ureter: Single nonobstructing renal calculus
measuring 6 mm. No hydronephrosis, perinephric stranding or solid
renal mass.

--Left kidney/ureter: Single nonobstructing renal calculus measuring
3 mm. No hydronephrosis, perinephric stranding or solid renal mass.

--Urinary bladder: Normal for degree of distention

STOMACH/BOWEL:

--Stomach/Duodenum: Mild wall thickening at the gastric fundus.
Normal duodenal course.

--Small bowel: No dilatation or inflammation.

--Colon: Extensive diverticulosis without acute inflammation.

--Appendix: Not visualized. No right lower quadrant inflammation or
free fluid.

VASCULAR/LYMPHATIC: There is aortic atherosclerosis without
hemodynamically significant stenosis. No abdominal or pelvic
lymphadenopathy.

REPRODUCTIVE: Small calcified uterine fibroid. No adnexal mass.

MUSCULOSKELETAL. Grade 1 anterolisthesis at L4-5.

OTHER: None.
IMPRESSION: 1. No obstructive uropathy or other acute abdominal or pelvic
abnormality.
2. Nonobstructive bilateral nephrolithiasis.
3. Hepatic cirrhosis.
4. Aortic Atherosclerosis ([id number]).
